# Patient Record
Sex: Male | Born: 1965
Health system: Southern US, Community
[De-identification: ages and names within clinical notes are randomized; demographics above are authoritative.]

## PROBLEM LIST (undated history)

## (undated) DIAGNOSIS — Z8619 Personal history of other infectious and parasitic diseases: Secondary | ICD-10-CM

## (undated) DIAGNOSIS — Z789 Other specified health status: Secondary | ICD-10-CM

## (undated) HISTORY — DX: Personal history of other infectious and parasitic diseases: Z86.19

## (undated) HISTORY — PX: LACERATION REPAIR: SHX5168

## (undated) HISTORY — PX: JOINT REPLACEMENT: SHX530

## (undated) HISTORY — PX: COLONOSCOPY W/ POLYPECTOMY: SHX1380

---

## 1999-09-28 ENCOUNTER — Emergency Department (HOSPITAL_COMMUNITY): Admission: EM | Admit: 1999-09-28 | Discharge: 1999-09-28 | Payer: Self-pay | Admitting: Emergency Medicine

## 2007-04-25 ENCOUNTER — Ambulatory Visit (HOSPITAL_BASED_OUTPATIENT_CLINIC_OR_DEPARTMENT_OTHER): Admission: RE | Admit: 2007-04-25 | Discharge: 2007-04-25 | Payer: Self-pay | Admitting: Orthopedic Surgery

## 2007-06-01 ENCOUNTER — Ambulatory Visit (HOSPITAL_BASED_OUTPATIENT_CLINIC_OR_DEPARTMENT_OTHER): Admission: RE | Admit: 2007-06-01 | Discharge: 2007-06-01 | Payer: Self-pay | Admitting: Orthopedic Surgery

## 2007-06-05 ENCOUNTER — Emergency Department (HOSPITAL_COMMUNITY): Admission: EM | Admit: 2007-06-05 | Discharge: 2007-06-05 | Payer: Self-pay | Admitting: Emergency Medicine

## 2007-06-05 IMAGING — CT CT PELVIS W/O CM
2 of 4 series · 12 of 36 positions shown, 19 images · IV contrast (agent unspecified)
Comparison: Plain films of same date.

CLINICAL DATA: 41-year-old with bilateral groin pain with ambulation. 
 PELVIS CT WITHOUT CONTRAST:
TECHNIQUE: Multidetector CT imaging of the pelvis was performed following the standard protocol without IV contrast.

[Series 4: recon 3: routine pelvis · axial · 0.80mm/px · z∈[-282,-75]mm · 11 of 101 slices shown, 17 images]
[im 9/101  soft-tissue]
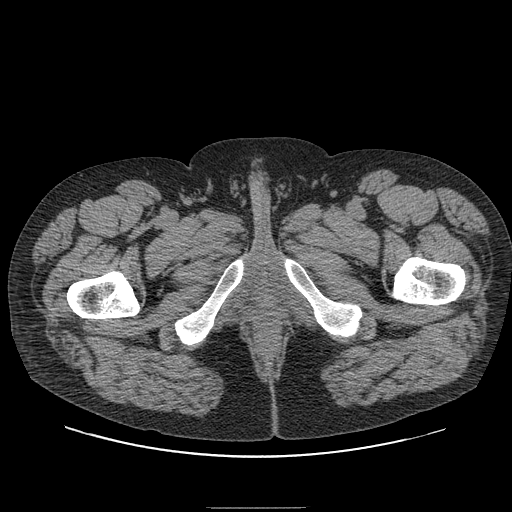
[im 9/101  bone]
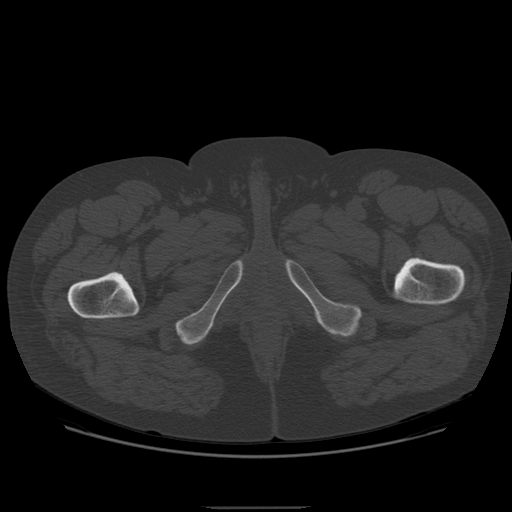
[im 17/101  soft-tissue]
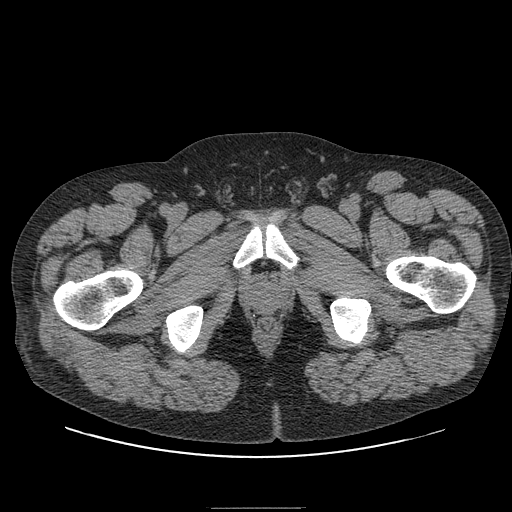
[im 26/101  soft-tissue]
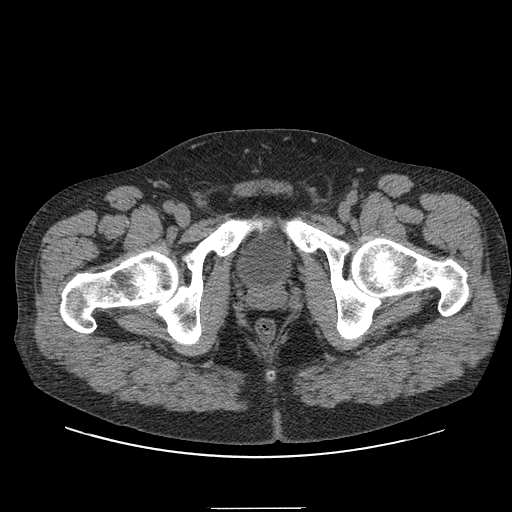
[im 34/101  soft-tissue]
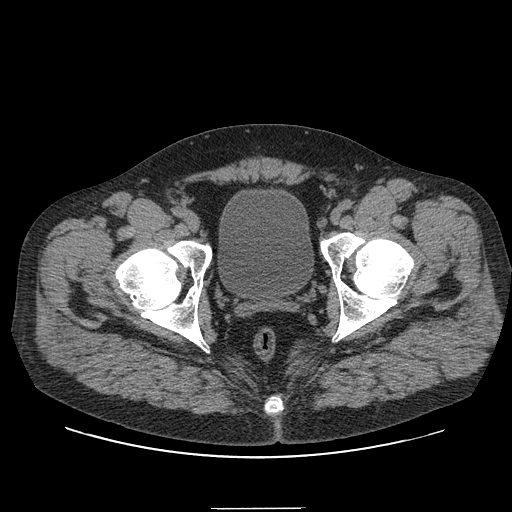
[im 42/101  soft-tissue]
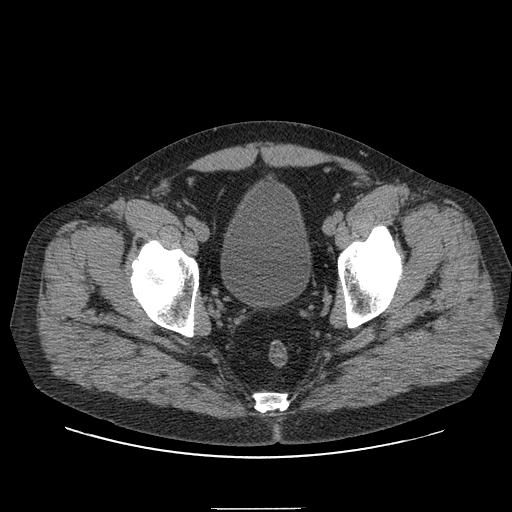
[im 51/101  soft-tissue]
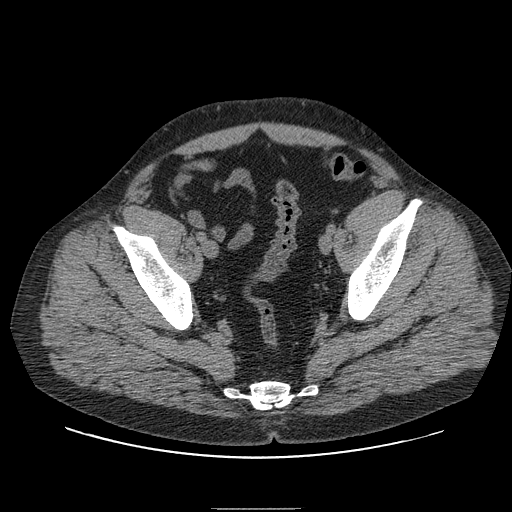
[im 59/101  soft-tissue]
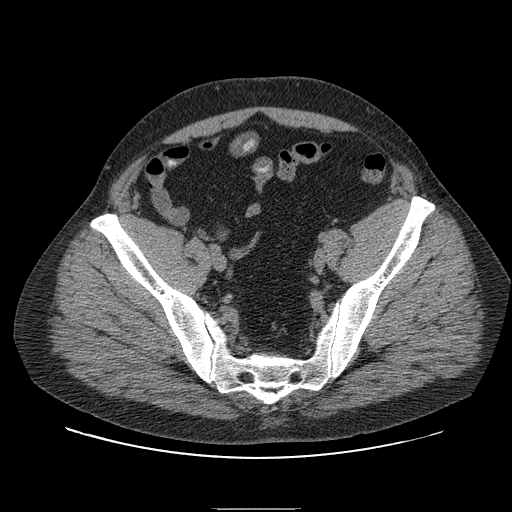
[im 67/101  soft-tissue]
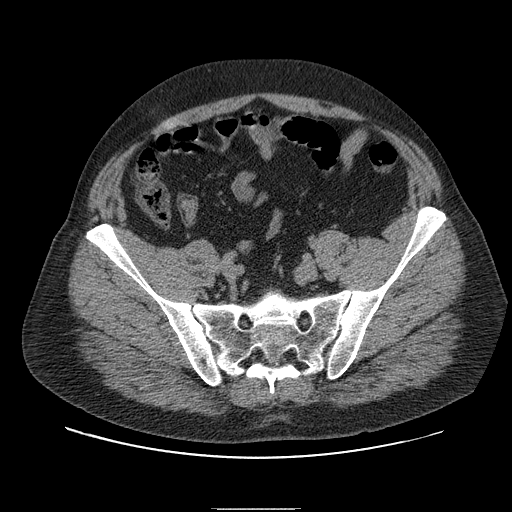
[im 67/101  lung]
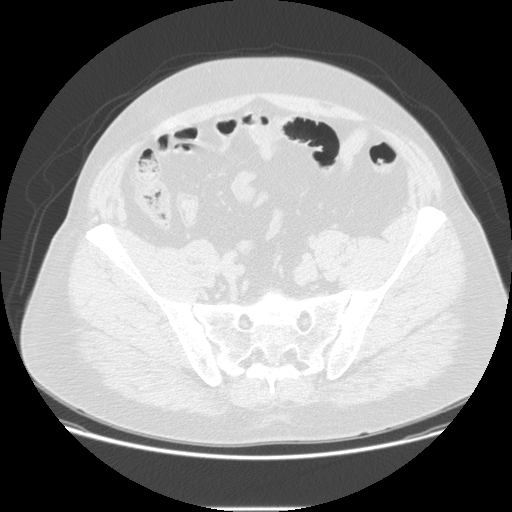
[im 76/101  soft-tissue]
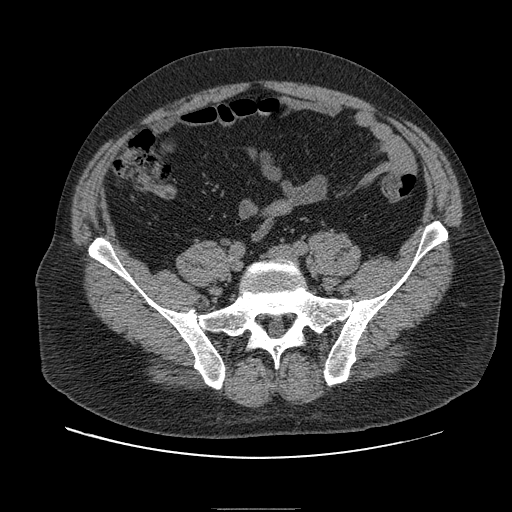
[im 76/101  lung]
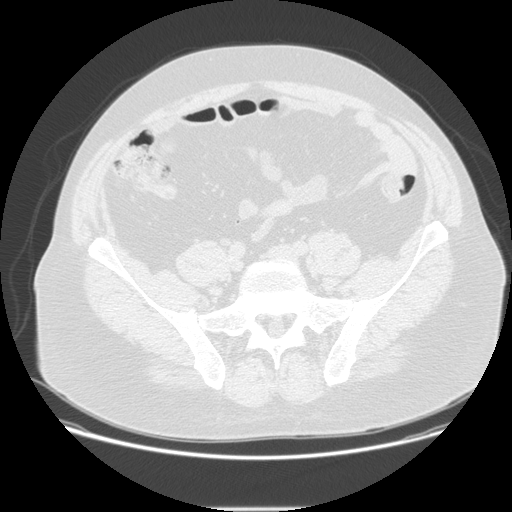
[im 76/101  bone]
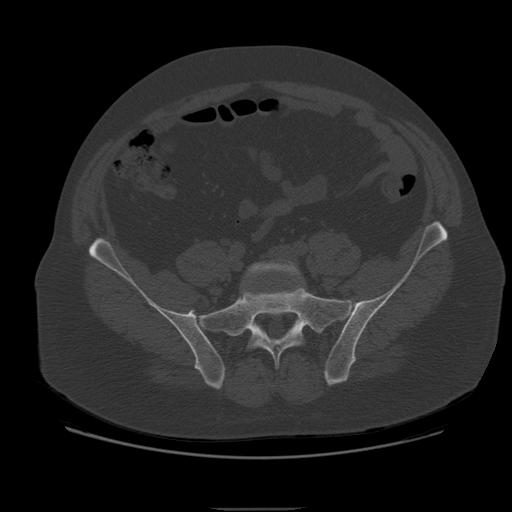
[im 84/101  soft-tissue]
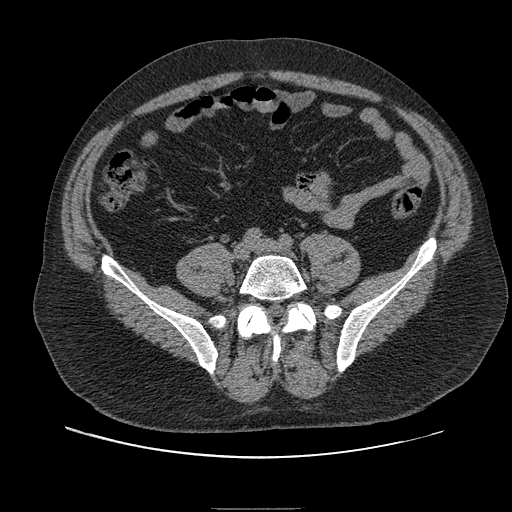
[im 84/101  lung]
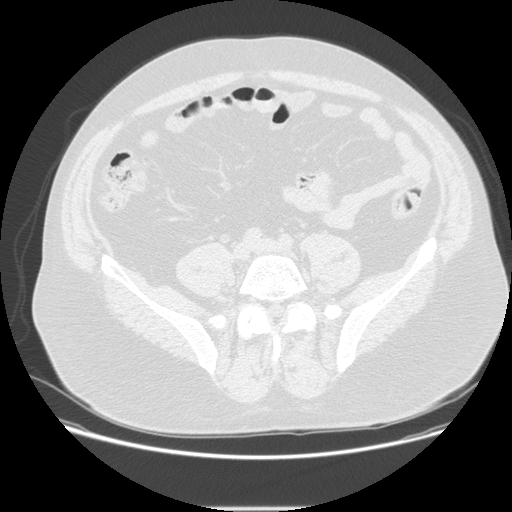
[im 92/101  soft-tissue]
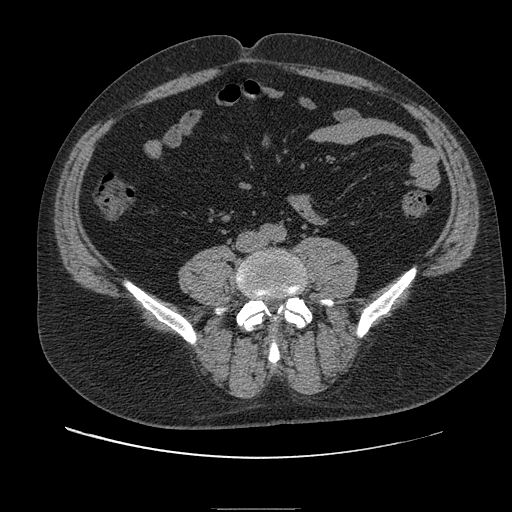
[im 92/101  lung]
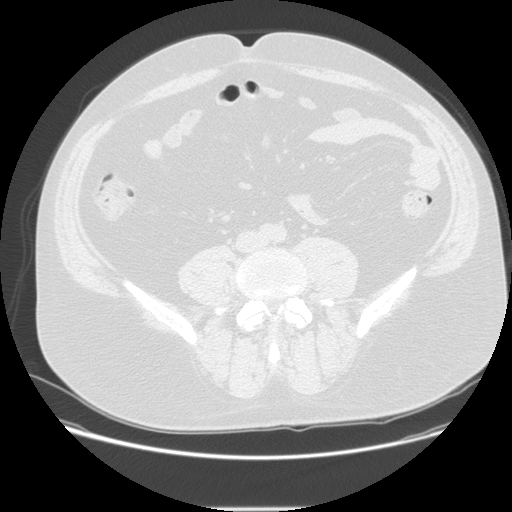

[Series 300: reformatted · sagittal · 0.78mm/px · 1 of 185 slices shown, 2 images]
[im 62/185  soft-tissue]
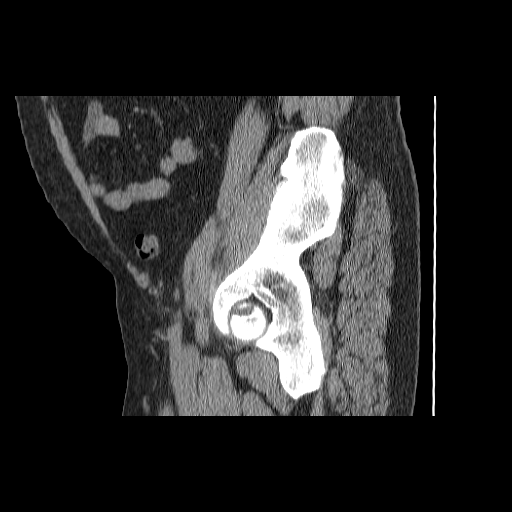
[im 62/185  bone]
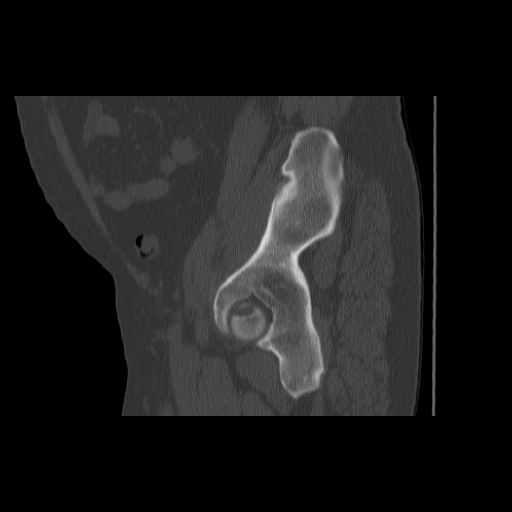

[12 of 36 positions shown; findings below may reference images not displayed]

FINDINGS: No acute or occult bony findings.  Specifically, I do not see any evidence for a stress fracture or avascular necrosis.  The SI joint and pubic symphysis are intact.  No sacral stress insufficiency fractures are demonstrated.  There are hip joint degenerative changes bilaterally, right slightly greater than left.  There is a small os acetabuli, and there are also small cystic changes near the head/neck junction in the region of the right femur, which can be seen with femoral acetabular impingement.   No significant intrapelvic abnormalities are seen.  No inguinal masses, adenopathy, or inguinal hernia.  The appendix is visualized and is normal.  The aorta and iliac vessels are normal in caliber.  The bladder appears normal.  The prostate gland and seminal vesicles are normal.
IMPRESSION: 1.  No acute bony findings.  No plain film evidence for stress fracture or avascular necrosis. 
 2.  Mild hip joint degenerative changes for the patient's age.  The right is slightly worse than the left.  There are cystic changes, which could reflect changes of femoral acetabular impingement. 
 3.  No significant intrapelvic abnormalities are seen.  No inguinal masses, adenopathy, or hernias.

## 2007-06-05 IMAGING — CR DG HIP W/ PELVIS BILAT
5 series · 5 of 5 positions shown · non-contrast
Comparison: None.

CLINICAL DATA: 41-year-old with bilateral hip pain. 
 BILATERAL HIPS WITH AP PELVIS ? 5 VIEW:

[t pelvis a.p.]
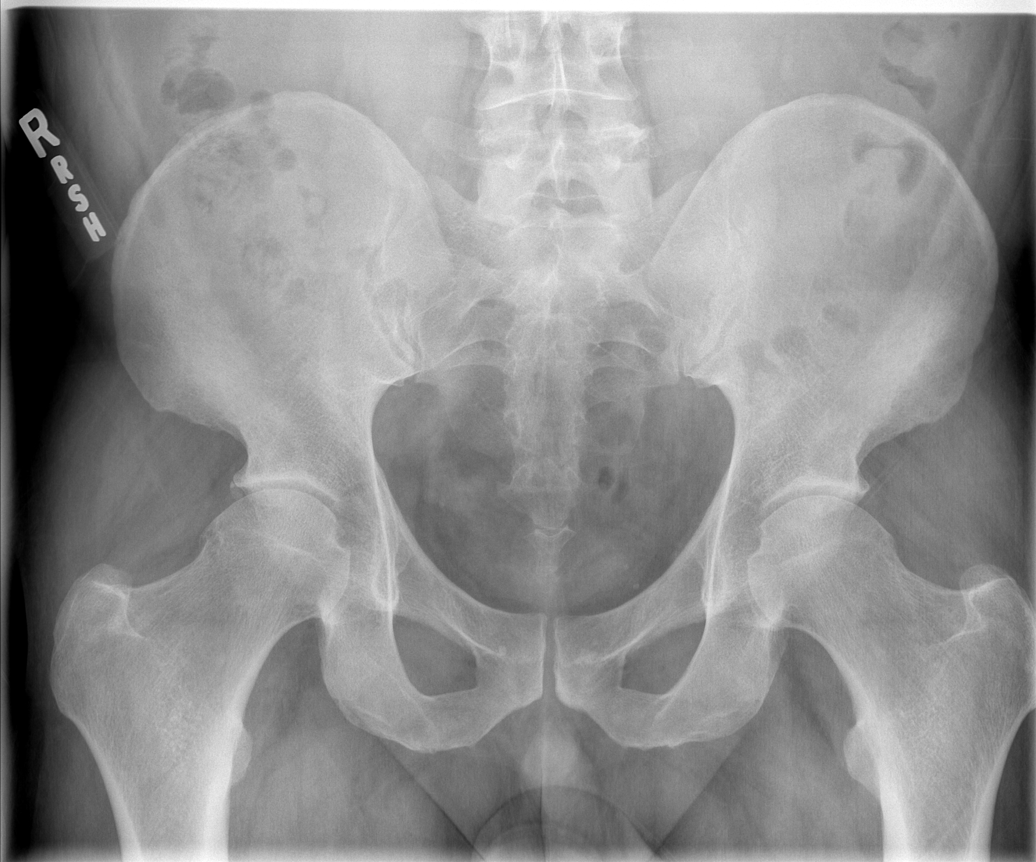

[t hip ap left]
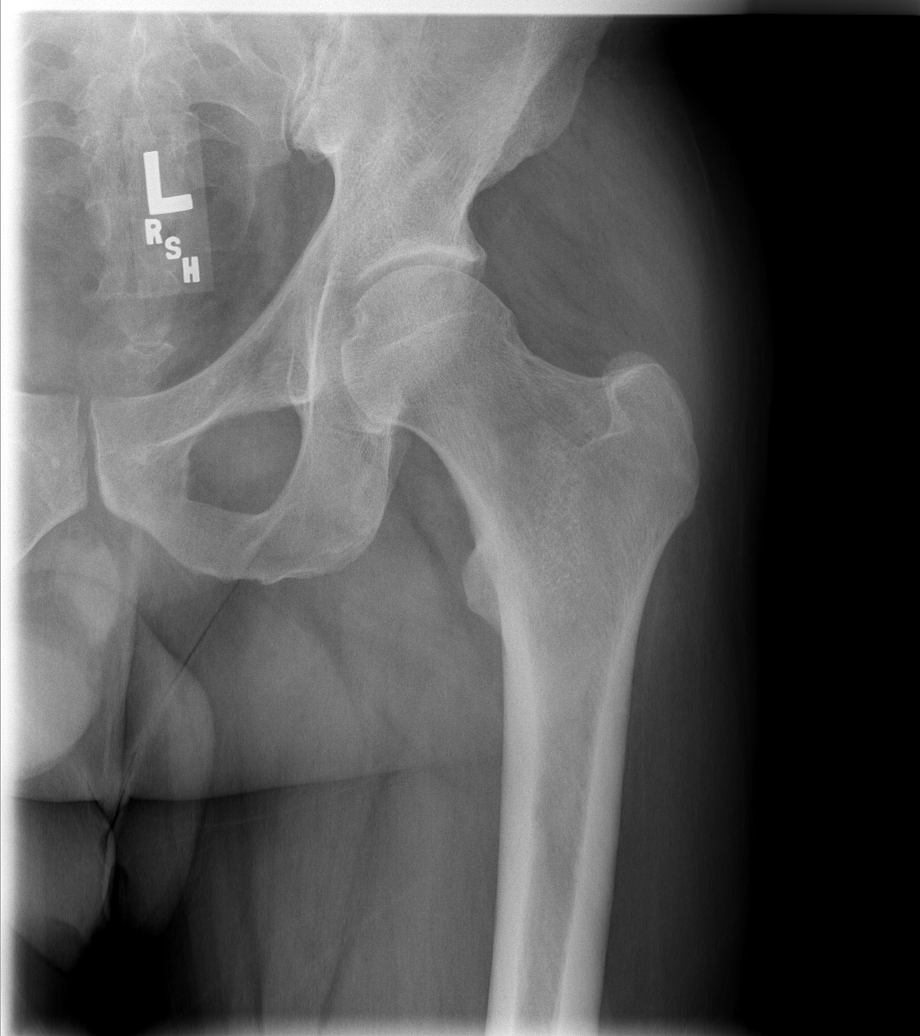

[t hip frog leg left]
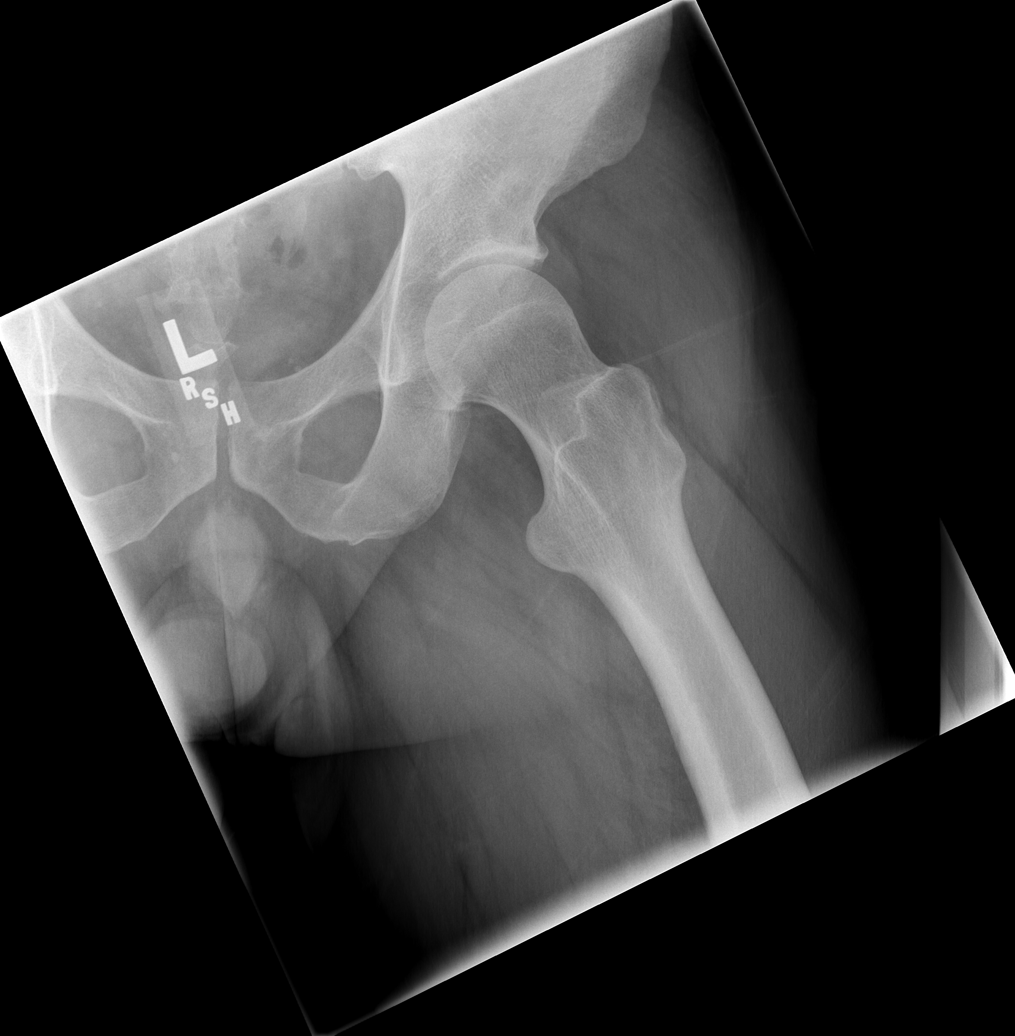

[t hip ap right]
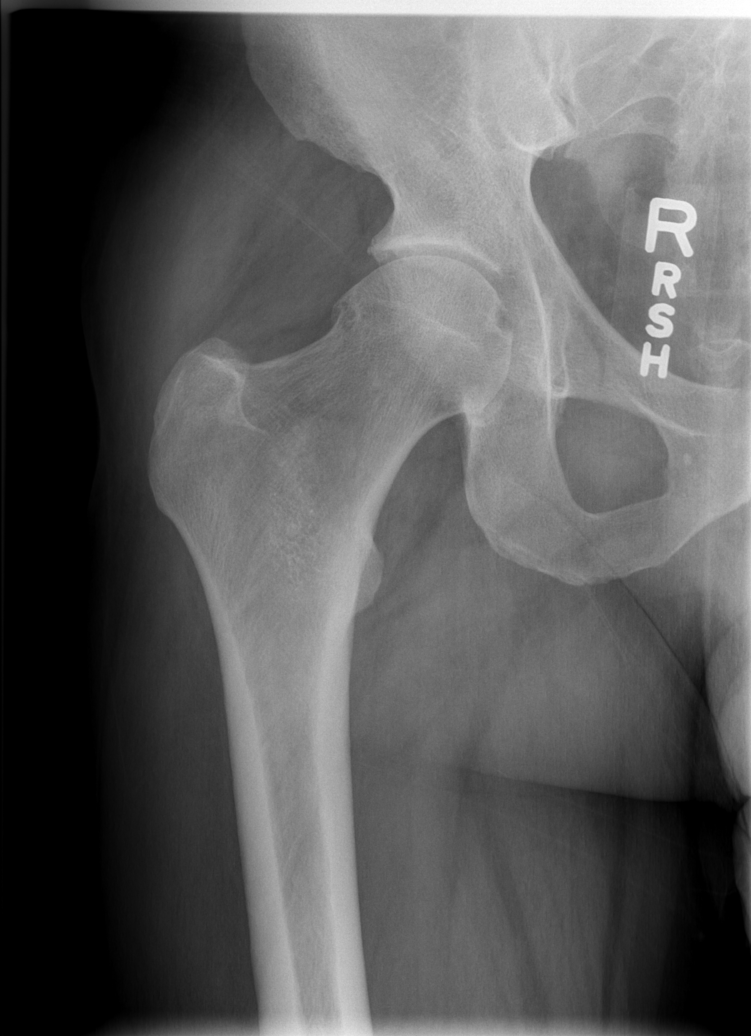

[t hip frog leg right]
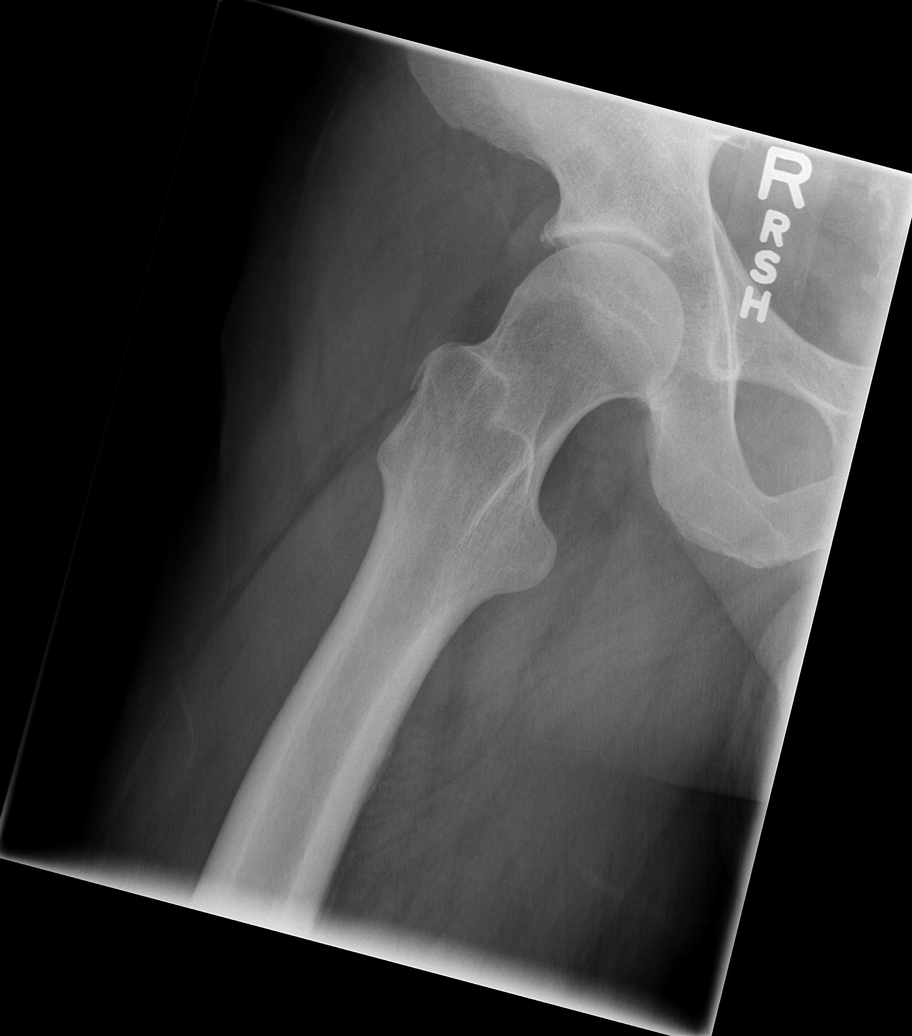

[5 of 5 positions shown; findings below may reference images not displayed]

FINDINGS: There are mild symmetric hip joint degenerative changes bilaterally.  No acute bony findings and no plain film evidence for avascular necrosis.  The pubic symphysis and SI joints are intact.  Pubic rami appear normal.
IMPRESSION: 1.  Hip joint degenerative changes bilaterally which appear fairly symmetric but are also advanced for the patient?s age.  
 2.  No evidence for fracture or avascular necrosis.

## 2007-07-27 HISTORY — PX: CARPAL TUNNEL RELEASE: SHX101

## 2009-07-21 ENCOUNTER — Emergency Department (HOSPITAL_COMMUNITY): Admission: EM | Admit: 2009-07-21 | Discharge: 2009-07-21 | Payer: Self-pay | Admitting: Emergency Medicine

## 2009-07-21 IMAGING — CR DG RIBS W/ CHEST 3+V BILAT
4 series · 4 of 4 positions shown · non-contrast
Comparison: None

CLINICAL DATA: Fall, landing on back.  Severe pain with breathing.

BILATERAL RIBS AND CHEST - 4+ VIEW

[view not recorded (1 of 4)]
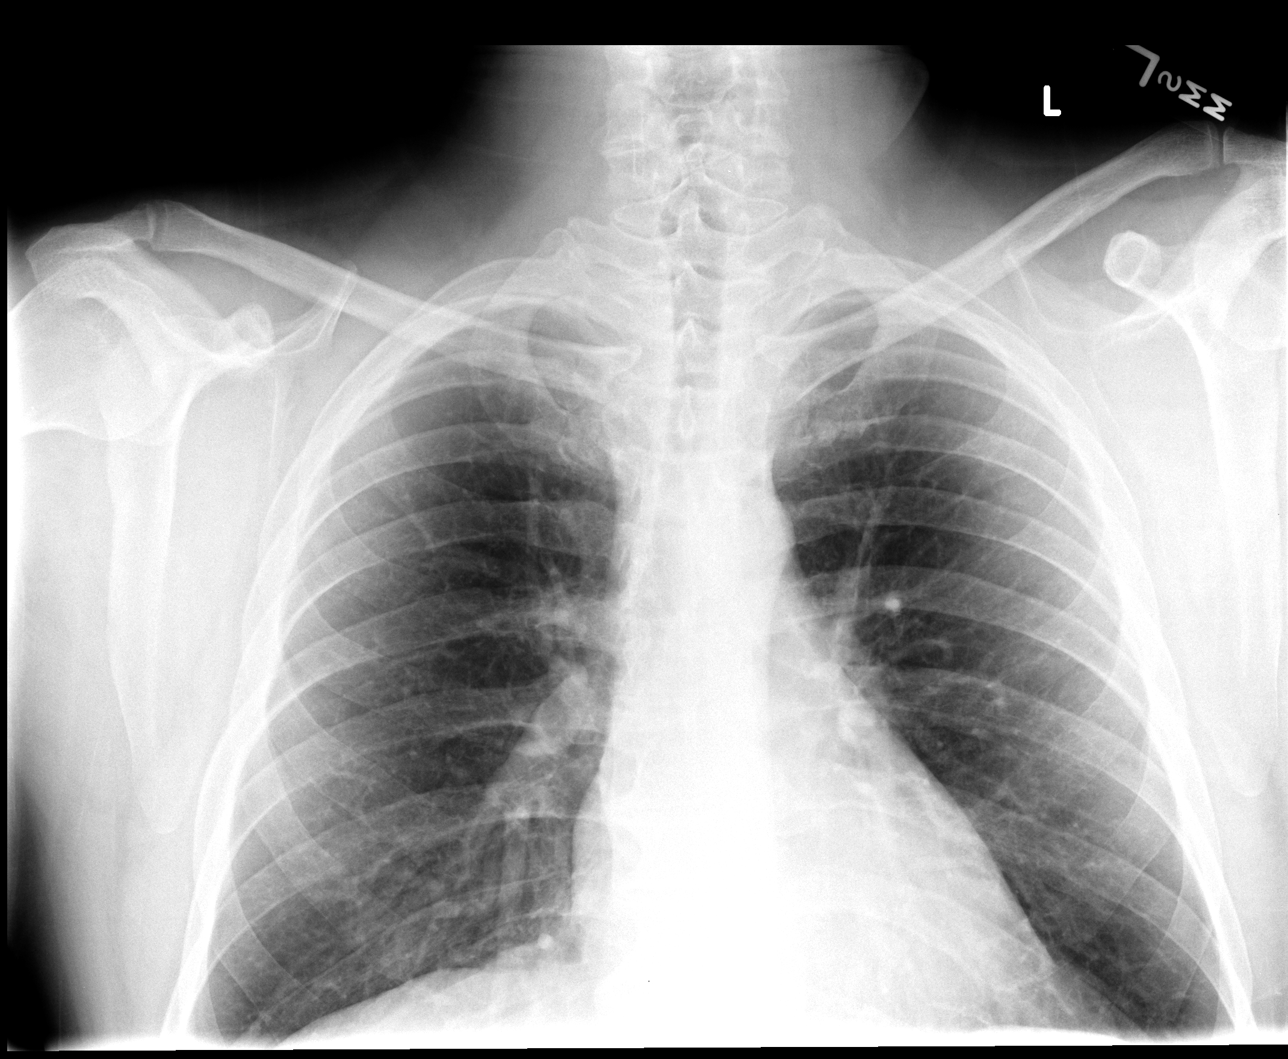

[view not recorded (2 of 4)]
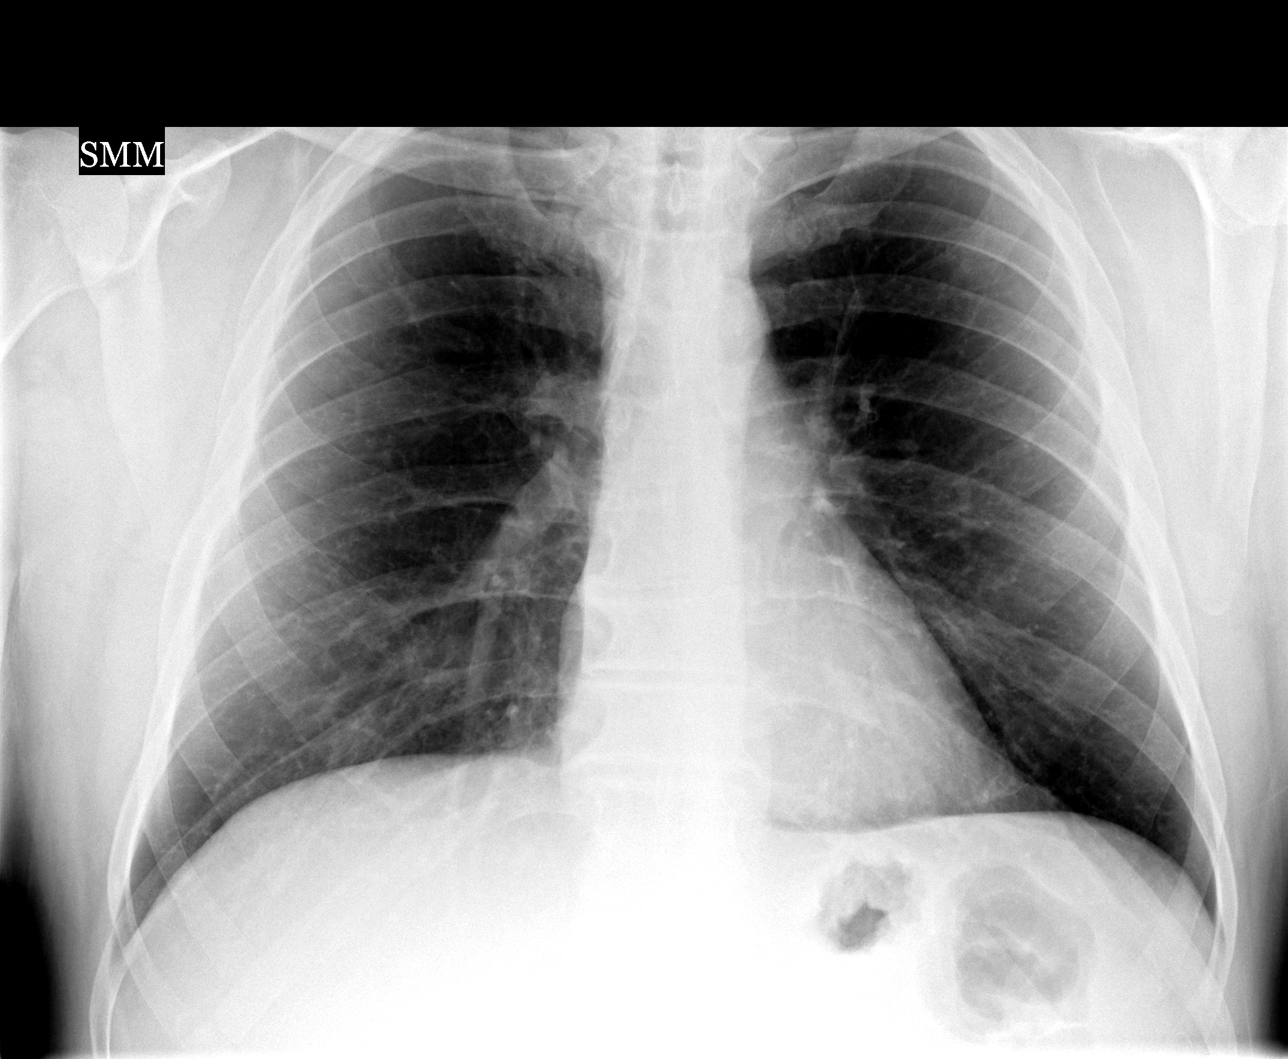

[view not recorded (3 of 4)]
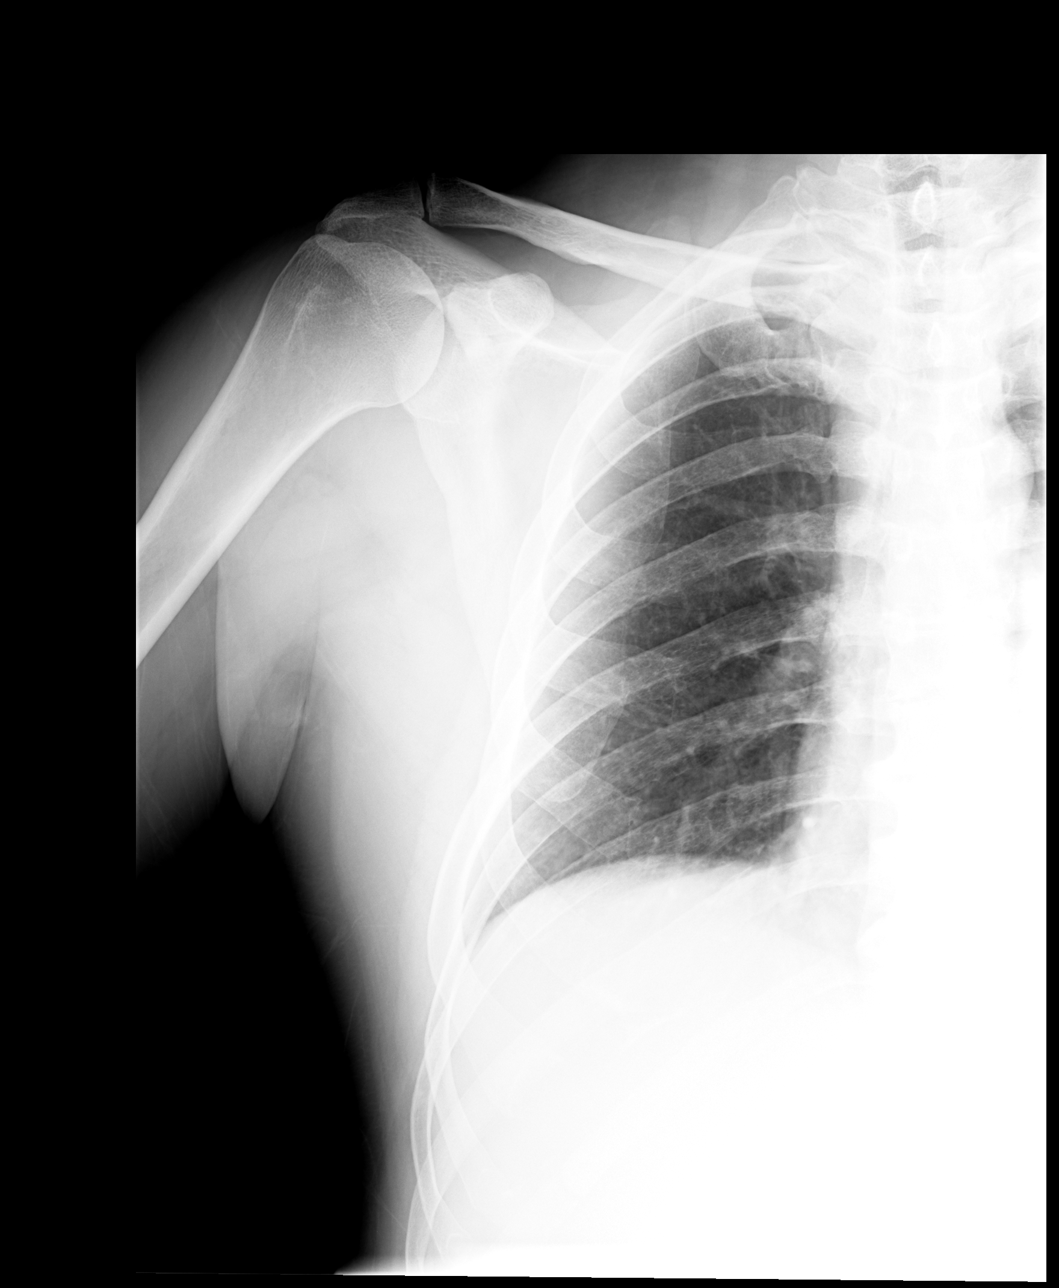

[view not recorded (4 of 4)]
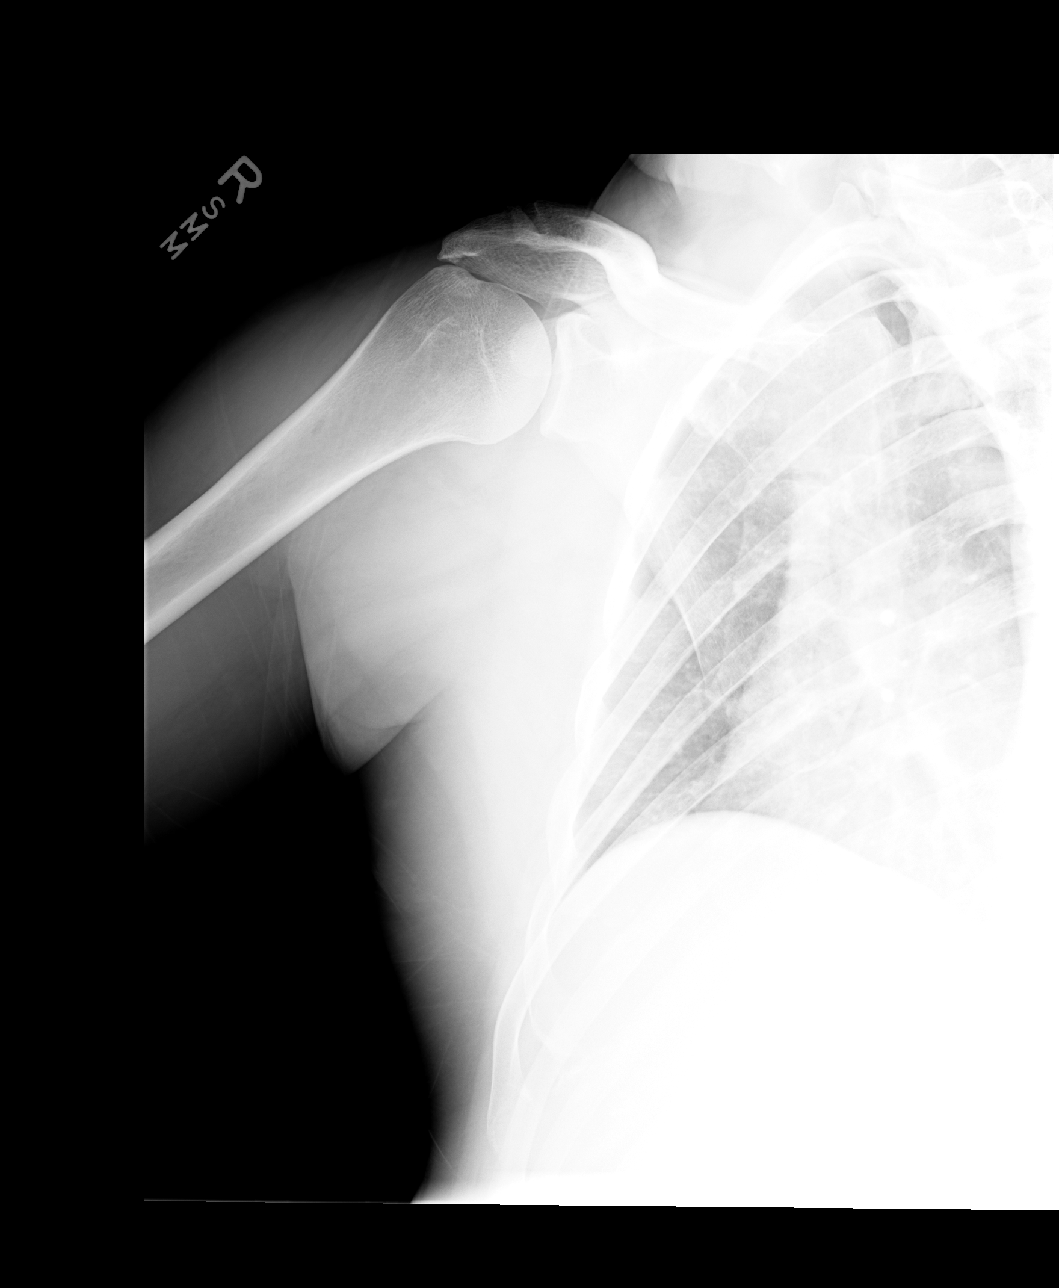

[4 of 4 positions shown; findings below may reference images not displayed]

FINDINGS: Heart and mediastinal contours are within normal limits.
No focal opacities or effusions.  No acute bony abnormality. No
evidence of rib fracture or pneumothorax.
IMPRESSION: No acute cardiopulmonary disease or acute bony abnormality.

## 2010-10-26 LAB — POCT URINALYSIS DIP (DEVICE)
Bilirubin Urine: NEGATIVE
Nitrite: NEGATIVE
Protein, ur: 30 mg/dL — AB
Specific Gravity, Urine: 1.025 (ref 1.005–1.030)
Urobilinogen, UA: 0.2 mg/dL (ref 0.0–1.0)

## 2010-12-08 NOTE — Op Note (Signed)
NAME:  [name], [name] NO.:  [id number]   MEDICAL RECORD NO.:  [id number]          PATIENT TYPE:  AMB   LOCATION:  DSC                          FACILITY:  [facility]   PHYSICIAN:  Katy Fitch. Sypher, M.D. DATE OF BIRTH:  08/26/1965   DATE OF PROCEDURE:  04/25/2007  DATE OF DISCHARGE:                               OPERATIVE REPORT   PREOPERATIVE DIAGNOSIS:  Chronic entrapment neuropathy, right median  nerve at carpal tunnel.   POSTOPERATIVE DIAGNOSIS:  Chronic entrapment neuropathy, right median  nerve at carpal tunnel.   PROCEDURE:  Release of right transverse carpal ligament.   SURGEON:  Katy Fitch. Sypher, M.D.   ASSISTANT:  Annye Rusk, P.A.-C.   ANESTHESIA:  General by LMA.   SUPERVISING ANESTHESIOLOGIST:  Zenon Mayo, M.D.   INDICATIONS:  [name] is a 45 year old gentleman employed by  [employer] who was referred for evaluation and management  of right hand numbness.  Clinical examination revealed signs of probable  carpal tunnel syndrome.  Electrodiagnostic studies obtained prior to his  consult documented significant right carpal tunnel syndrome.   He has failed nonoperative measures including splinting, activity  modification, and steroid injection into his ulnar bursa.  He also  reports that he tried chiropractic treatment for his entrapped  neuropathy without success.  He now presents for release of his right  transverse carpal ligament.   Preoperatively he was advised of the potential risks and benefits of  surgery.   Questions were invited and answered in the holding area.   PROCEDURE:  [name] was brought to the operating room and placed in  the supine position on the operating table.   Following the induction of general anesthesia by LMA technique, the  right arm was prepped with Betadine soap and solution and sterilely  draped.  A pneumatic tourniquet was applied to the proximal right  brachium.   Following  exsanguination of the right arm with Esmarch bandage, the  arterial tourniquet was inflated to 230 mmHg.  The procedure commenced  with a 2-cm incision in the palm in the line of the ring finger.  The  subcutaneous tissues were carefully divided, taking care to identify the  palmar fascia.  This was split in line with its fibers.  The mid palmar  space was explored, with identification of the superficial palmar arch  and the common sensory branch of the median nerve.  The distal margin of  the transverse carpal ligament was isolated.  A Penfield 4 elevator was  used to sound the canal and to clear synovium off of the deep surface of  the transverse carpal ligament.   The ligament was then released with scissors along its ulnar border  extending into the distal forearm.  The volar forearm fascia was  released subcutaneously.   This widely opened the carpal canal.  The ulnar bursa was noted to be  quite fibrotic.  No other masses or predicaments were appreciated.   The wound was then inspect for bleeding points which were  electrocauterized with bipolar current followed by repair of the  skin  with intradermal 3-0 Prolene suture.   A compressive dressing was applied with a volar plaster splint  maintaining the wrist in 5 degrees of dorsiflexion.   For aftercare, [name] is provided a prescription for Percocet 5 mg 1  p.o. q.4-6h. p.r.n. pain, 20 tablets without refill.   He will return to see me for followup in the office in 1 week or sooner  p.r.n. any problems.  He is cautioned to keep his dressing dry.  He may  begin immediate range of motion excises.      Katy Fitch Sypher, M.D.  Electronically Signed     RVS/MEDQ  D:  04/25/2007  T:  04/25/2007  Job:  [id number]

## 2010-12-08 NOTE — Op Note (Signed)
NAME:  [name], [name] NO.:  [id number]   MEDICAL RECORD NO.:  [id number]          PATIENT TYPE:  AMB   LOCATION:  DSC                          FACILITY:  [facility]   PHYSICIAN:  Katy Fitch. Sypher, M.D. 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   POSTOPERATIVE DIAGNOSIS:  Entrapment neuropathy, left median nerve at  wrist.   OPERATION:  Release of left transverse carpal ligament.   OPERATING SURGEON:  Josephine Igo, MD   ASSISTANT:  Annye Rusk, PA-C.   ANESTHESIA:  General by LMA.   SUPERVISING ANESTHESIOLOGIST:  Bedelia Person, MD   INDICATIONS:  [name] was brought to the operating room and placed  in a supine position on the operating table.   Following an anesthesia consult with Dr. Gypsy Balsam, general anesthesia by  LMA technique was recommended and accepted by [name]  [name]  was brought to room #5 of the [facility] and placed in supine  position upon the operating table and under Dr. Burnett Corrente direct  supervision, general anesthesia by LMA technique induced.   The left arm was then prepped with Betadine soap and solution and  sterilely draped.  A pneumatic tourniquet was applied to the proximal  left brachium.  Following exsanguination of the left arm with an Esmarch  bandage, the arterial tourniquet was inflated to 220 mmHg.   The procedure commenced with a short incision in the line of the ring  finger and the palm.  Subcutaneous tissues were carefully divided,  revealing the palmar fascia.  This was split longitudinally to reveal  the common extensor branch of the median nerve.  These were followed  back to the transverse carpal ligament, which was gently isolated from  the median nerve.  The median nerve was noted to have a persistent  median artery and veins along its  volar surface.  After the nerve was  carefully cleared from the deep surface of the transverse carpal  ligament and the volar forearm fascia, the ligament and fascia were  released subcutaneously into the distal forearm with scissors; this  widely opened carpal canal, exposing the ulnar bursa.  No masses or  predicaments were noted.  There were no significant bleeding issues.   The wound was then repaired with intradermal 3-0 Prolene suture.   A compressive dressing was applied with a volar plaster splint,  maintaining the wrist in 5 degrees of dorsiflexion.   For aftercare, [name] was provided a prescription for Percocet 5 mg  one p.o. q.4-6 h. p.r.n. pain, 20 tablets without refill.   We will see him back for followup in the office in 1 week for dressing  change and advancement to an exercise program.      Katy Fitch. Sypher, M.D.  Electronically Signed     RVS/MEDQ  D:  06/01/2007  T:  06/02/2007  Job:  [id number]

## 2011-05-04 LAB — URINALYSIS, ROUTINE W REFLEX MICROSCOPIC
Bilirubin Urine: NEGATIVE
Glucose, UA: NEGATIVE
Ketones, ur: NEGATIVE
Nitrite: NEGATIVE
Protein, ur: NEGATIVE
Specific Gravity, Urine: 1.008
pH: 6

## 2011-05-04 LAB — BASIC METABOLIC PANEL
Chloride: 100
Creatinine, Ser: 1.09
Sodium: 133 — ABNORMAL LOW

## 2011-05-04 LAB — POCT HEMOGLOBIN-HEMACUE: Hemoglobin: 15

## 2011-05-04 LAB — CK: Total CK: 62

## 2012-09-19 ENCOUNTER — Ambulatory Visit (INDEPENDENT_AMBULATORY_CARE_PROVIDER_SITE_OTHER): Payer: BC Managed Care – HMO | Admitting: Internal Medicine

## 2012-09-19 ENCOUNTER — Other Ambulatory Visit (INDEPENDENT_AMBULATORY_CARE_PROVIDER_SITE_OTHER): Payer: BC Managed Care – HMO

## 2012-09-19 ENCOUNTER — Encounter: Payer: Self-pay | Admitting: Internal Medicine

## 2012-09-19 VITALS — BP 132/82 | HR 95 | Temp 98.1°F | Ht 72.0 in | Wt 235.4 lb

## 2012-09-19 DIAGNOSIS — Z0000 Encounter for general adult medical examination without abnormal findings: Secondary | ICD-10-CM

## 2012-09-19 DIAGNOSIS — K625 Hemorrhage of anus and rectum: Secondary | ICD-10-CM

## 2012-09-19 DIAGNOSIS — Z130 Encounter for screening for diseases of the blood and blood-forming organs and certain disorders involving the immune mechanism: Secondary | ICD-10-CM

## 2012-09-19 DIAGNOSIS — Z131 Encounter for screening for diabetes mellitus: Secondary | ICD-10-CM

## 2012-09-19 DIAGNOSIS — Z13220 Encounter for screening for lipoid disorders: Secondary | ICD-10-CM

## 2012-09-19 LAB — LIPID PANEL
Cholesterol: 215 mg/dL — ABNORMAL HIGH (ref 0–200)
HDL: 65.1 mg/dL (ref >39.00)
Triglycerides: 106 mg/dL (ref 0.0–149.0)
VLDL: 21.2 mg/dL (ref 0.0–40.0)

## 2012-09-19 LAB — CBC: MCV: 97.9 fl (ref 78.0–100.0)

## 2012-09-19 LAB — BASIC METABOLIC PANEL
BUN: 11 mg/dL (ref 6–23)
CO2: 28 mEq/L (ref 19–32)

## 2012-09-19 MED ORDER — HYDROCORTISONE ACETATE 25 MG RE SUPP: 25.0000 mg | Freq: Two times a day (BID) | RECTAL | Status: DC

## 2012-09-19 NOTE — Progress Notes (Signed)
HPI  [name] presents to clinic today to establish care. He has not seen a PCP ever. He uses the ED and urgent care when he needs something. He does have some concerns today about intermittent stool in his blood. He notices blood on the stool in the toilet as well when he wipes. This did occur about 1 year ago. He had it evaluated and the doctor told it him it was hemorrhoids but he never received treatment for this. He is very concerned.  Flu: never Tetanus: never Eye doctor: never Dentist: yearly   Past Medical History  Diagnosis Date  . Blood in stool     Current Outpatient Prescriptions  Medication Sig Dispense Refill  . hydrocortisone (ANUSOL-HC) 25 MG suppository Place 1 suppository (25 mg total) rectally 2 (two) times daily.  12 suppository  0   No current facility-administered medications for this visit.    No Known Allergies  Family History  Problem Relation Age of Onset  . Cancer Father   . Diabetes Neg Hx   . Stroke Neg Hx   . Hypertension Neg Hx   . Hyperlipidemia Neg Hx     History   Social History  . Marital Status: Married    Spouse Name: N/A    Number of Children: N/A  . Years of Education: 12   Occupational History  .     Social History Main Topics  . Smoking status: Former Games developer  . Smokeless tobacco: Not on file  . Alcohol Use: Yes  . Drug Use: No  . Sexually Active: Yes   Other Topics Concern  . Not on file   Social History Narrative   Regular exercise-no   Caffeine Use-yes    ROS:  Constitutional: Denies fever, malaise, fatigue, headache or abrupt weight changes.  HEENT: Denies eye pain, eye redness, ear pain, ringing in the ears, wax buildup, runny nose, nasal congestion, bloody nose, or sore throat. Respiratory: Denies difficulty breathing, shortness of breath, cough or sputum production.   Cardiovascular: Denies chest pain, chest tightness, palpitations or swelling in the hands or feet.  Gastrointestinal: [name] reports blood in stool.  Denies abdominal pain, bloating, constipation, diarrhea or blood in the stool.  GU: Denies frequency, urgency, pain with urination, blood in urine, odor or discharge. Musculoskeletal: Denies decrease in range of motion, difficulty with gait, muscle pain or joint pain and swelling.  Skin: Denies redness, rashes, lesions or ulcercations.  Neurological: Denies dizziness, difficulty with memory, difficulty with speech or problems with balance and coordination.   No other specific complaints in a complete review of systems (except as listed in HPI above).  PE:  BP 132/82  Pulse 95  Temp(Src) 98.1 F (36.7 C) (Oral)  Ht 6' (1.829 m)  Wt 235 lb 6.4 oz (106.777 kg)  BMI 31.92 kg/m2  SpO2 97% Wt Readings from Last 3 Encounters:  09/19/12 235 lb 6.4 oz (106.777 kg)    General: Appears his stated age, well developed, well nourished in NAD. HEENT: Head: normal shape and size; Eyes: sclera white, no icterus, conjunctiva pink, PERRLA and EOMs intact; Ears: Tm's gray and intact, normal light reflex; Nose: mucosa pink and moist, septum midline; Throat/Mouth: Teeth present, mucosa pink and moist, no lesions or ulcerations noted.  Neck: Normal range of motion. Neck supple, trachea midline. No massses, lumps or thyromegaly present.  Cardiovascular: Normal rate and rhythm. S1,S2 noted.  No murmur, rubs or gallops noted. No JVD or BLE edema. No carotid bruits noted. Pulmonary/Chest: Normal  effort and positive vesicular breath sounds. No respiratory distress. No wheezes, rales or ronchi noted.  Abdomen: Soft and nontender. Normal bowel sounds, no bruits noted. No distention or masses noted. Liver, spleen and kidneys non palpable. No external hemorrhoids, no internal hemorrhoids per rectal exam. No fissures noted. Musculoskeletal: Normal range of motion. No signs of joint swelling. No difficulty with gait.  Neurological: Alert and oriented. Cranial nerves II-XII intact. Coordination normal. +DTRs  bilaterally. Psychiatric: Mood and affect normal. Behavior is normal. Judgment and thought content normal.     Assessment and Plan:  Preventative Health Maintenance:  [name] declines flu and tetanus shot Will obtain basic screening labs today  Rectal Bleeding, likely due to internal hemorrhoids, new onset with additional workup required:  Rectal exam negative eRx for anusol suppositories If continues, will need referral to GI for possible scope  RTC in 1 year or sooner if needed

## 2012-09-19 NOTE — Patient Instructions (Signed)
Health Maintenance, Males A healthy lifestyle and preventative care can promote health and wellness.  Maintain regular health, dental, and eye exams.  Eat a healthy diet. Foods like vegetables, fruits, whole grains, low-fat dairy products, and lean protein foods contain the nutrients you need without too many calories. Decrease your intake of foods high in solid fats, added sugars, and salt. Get information about a proper diet from your caregiver, if necessary.  Regular physical exercise is one of the most important things you can do for your health. Most adults should get at least 150 minutes of moderate-intensity exercise (any activity that increases your heart rate and causes you to sweat) each week. In addition, most adults need muscle-strengthening exercises on 2 or more days a week.   Maintain a healthy weight. The body mass index (BMI) is a screening tool to identify possible weight problems. It provides an estimate of body fat based on height and weight. Your caregiver can help determine your BMI, and can help you achieve or maintain a healthy weight. For adults 20 years and older:  A BMI below 18.5 is considered underweight.  A BMI of 18.5 to 24.9 is normal.  A BMI of 25 to 29.9 is considered overweight.  A BMI of 30 and above is considered obese.  Maintain normal blood lipids and cholesterol by exercising and minimizing your intake of saturated fat. Eat a balanced diet with plenty of fruits and vegetables. Blood tests for lipids and cholesterol should begin at age 20 and be repeated every 5 years. If your lipid or cholesterol levels are high, you are over 50, or you are a high risk for heart disease, you may need your cholesterol levels checked more frequently.Ongoing high lipid and cholesterol levels should be treated with medicines, if diet and exercise are not effective.  If you smoke, find out from your caregiver how to quit. If you do not use tobacco, do not start.  If you  choose to drink alcohol, do not exceed 2 drinks per day. One drink is considered to be 12 ounces (355 mL) of beer, 5 ounces (148 mL) of wine, or 1.5 ounces (44 mL) of liquor.  Avoid use of street drugs. Do not share needles with anyone. Ask for help if you need support or instructions about stopping the use of drugs.  High blood pressure causes heart disease and increases the risk of stroke. Blood pressure should be checked at least every 1 to 2 years. Ongoing high blood pressure should be treated with medicines if weight loss and exercise are not effective.  If you are 45 to 48 years old, ask your caregiver if you should take aspirin to prevent heart disease.  Diabetes screening involves taking a blood sample to check your fasting blood sugar level. This should be done once every 3 years, after age 45, if you are within normal weight and without risk factors for diabetes. Testing should be considered at a younger age or be carried out more frequently if you are overweight and have at least 1 risk factor for diabetes.  Colorectal cancer can be detected and often prevented. Most routine colorectal cancer screening begins at the age of 50 and continues through age 75. However, your caregiver may recommend screening at an earlier age if you have risk factors for colon cancer. On a yearly basis, your caregiver may provide home test kits to check for hidden blood in the stool. Use of a small camera at the end of a tube,   to directly examine the colon (sigmoidoscopy or colonoscopy), can detect the earliest forms of colorectal cancer. Talk to your caregiver about this at age 50, when routine screening begins. Direct examination of the colon should be repeated every 5 to 10 years through age 75, unless early forms of pre-cancerous polyps or small growths are found.  Hepatitis C blood testing is recommended for all people born from 1945 through 1965 and any individual with known risks for hepatitis C.  Healthy  men should no longer receive prostate-specific antigen (PSA) blood tests as part of routine cancer screening. Consult with your caregiver about prostate cancer screening.  Testicular cancer screening is not recommended for adolescents or adult males who have no symptoms. Screening includes self-exam, caregiver exam, and other screening tests. Consult with your caregiver about any symptoms you have or any concerns you have about testicular cancer.  Practice safe sex. Use condoms and avoid high-risk sexual practices to reduce the spread of sexually transmitted infections (STIs).  Use sunscreen with a sun protection factor (SPF) of 30 or greater. Apply sunscreen liberally and repeatedly throughout the day. You should seek shade when your shadow is shorter than you. Protect yourself by wearing long sleeves, pants, a wide-brimmed hat, and sunglasses year round, whenever you are outdoors.  Notify your caregiver of new moles or changes in moles, especially if there is a change in shape or color. Also notify your caregiver if a mole is larger than the size of a pencil eraser.  A one-time screening for abdominal aortic aneurysm (AAA) and surgical repair of large AAAs by sound wave imaging (ultrasonography) is recommended for ages 65 to 75 years who are current or former smokers.  Stay current with your immunizations. Document Released: 01/08/2008 Document Revised: 10/04/2011 Document Reviewed: 12/07/2010 [employer] Patient Information 2013 [employer].  

## 2012-11-16 ENCOUNTER — Ambulatory Visit (INDEPENDENT_AMBULATORY_CARE_PROVIDER_SITE_OTHER): Payer: BC Managed Care – HMO | Admitting: Nurse Practitioner

## 2012-11-16 ENCOUNTER — Encounter: Payer: Self-pay | Admitting: Internal Medicine

## 2012-11-16 ENCOUNTER — Encounter: Payer: Self-pay | Admitting: Nurse Practitioner

## 2012-11-16 ENCOUNTER — Ambulatory Visit (INDEPENDENT_AMBULATORY_CARE_PROVIDER_SITE_OTHER): Payer: BC Managed Care – HMO | Admitting: Internal Medicine

## 2012-11-16 ENCOUNTER — Encounter: Payer: Self-pay | Admitting: Gastroenterology

## 2012-11-16 VITALS — BP 132/68 | HR 68 | Temp 97.0°F | Ht 72.0 in | Wt 238.0 lb

## 2012-11-16 VITALS — BP 116/76 | HR 76 | Ht 71.65 in | Wt 237.2 lb

## 2012-11-16 DIAGNOSIS — K625 Hemorrhage of anus and rectum: Secondary | ICD-10-CM

## 2012-11-16 MED ORDER — MOVIPREP 100 G PO SOLR: 1.0000 | Freq: Once | ORAL | Status: DC

## 2012-11-16 NOTE — Progress Notes (Signed)
  HPI :  Patient is a 47 year old male with no significant past medical history referred here by PCP for evaluation of rectal bleeding. Patient initially presented to his PCP in late February with a one-year history of rectal bleeding with bowel movements. Digital rectal exam that day was normal. Patient was treated empirically for hemorrhoids. He had a followup appointment with PCP this morning at which time recurrent rectal bleeding was reported. He denies rectal pain, nausea, vomiting or abdominal pain. Denies constipation, diarrhea or any bowel changes. Weight is stable. No family history colon cancer. Patient has never had a colonoscopy.  History reviewed. No pertinent past medical history.  Family History  Problem Relation Age of Onset  . Cancer Father     Hodgkin's disease  . Diabetes Neg Hx   . Stroke Neg Hx   . Hypertension Neg Hx   . Hyperlipidemia Neg Hx    History  Substance Use Topics  . Smoking status: Former Smoker    Types: Cigarettes    Quit date: 07/27/1999  . Smokeless tobacco: Never Used  . Alcohol Use: Yes     Comment: 1-12 beers per day   No current outpatient prescriptions on file.   No current facility-administered medications for this visit.   No Known Allergies  Review of Systems: All systems reviewed and negative except where noted in HPI.   Physical Exam: BP 116/76  Pulse 76  Ht 5' 11.65" (1.82 m)  Wt 237 lb 4 oz (107.616 kg)  BMI 32.49 kg/m2 Constitutional: Well-developed, white male in no acute distress. HEENT: Normocephalic and atraumatic. Conjunctivae are normal. No scleral icterus. Neck supple.  Cardiovascular: Normal rate, regular rhythm.  Pulmonary/chest: Effort normal and breath sounds normal. No wheezing, rales or rhonchi. Abdominal: Soft, nondistended, nontender. Bowel sounds active throughout. There are no masses palpable. No hepatomegaly. Rectal: Patient had rectal exams morning at PCP, he request no repeat rectal exam Extremities:  no edema Lymphadenopathy: No cervical adenopathy noted. Neurological: Alert and oriented to person place and time. Skin: Skin is warm and dry. No rashes noted. Psychiatric: Normal mood and affect. Behavior is normal.   ASSESSMENT AND PLAN:  47 year old male with a one year plus history of rectal bleeding with bowel movements. Bleeding did not respond to hemorrhoid medication prescribed by PCP. For further evaluation patient needs a colonoscopy to exclude other etiologies of bleeding. the risks, benefits, and alternatives to colonoscopy with possible biopsy and possible polypectomy were discussed with the patient and he consents to proceed.

## 2012-11-16 NOTE — Patient Instructions (Signed)
Rectal Bleeding Rectal bleeding is when blood passes out of the anus. It is usually a sign that something is wrong. It may not be serious, but it should always be evaluated. Rectal bleeding may present as bright red blood or extremely dark stools. The color may range from dark red or maroon to black (like tar). It is important that the cause of rectal bleeding be identified so treatment can be started and the problem corrected. CAUSES   Hemorrhoids. These are enlarged (dilated) blood vessels or veins in the anal or rectal area.  Fistulas. Theseare abnormal, burrowing channels that usually run from inside the rectum to the skin around the anus. They can bleed.  Anal fissures. This is a tear in the tissue of the anus. Bleeding occurs with bowel movements.  Diverticulosis. This is a condition in which pockets or sacs project from the bowel wall. Occasionally, the sacs can bleed.  Diverticulitis. Thisis an infection involving diverticulosis of the colon.  Proctitis and colitis. These are conditions in which the rectum, colon, or both, can become inflamed and pitted (ulcerated).  Polyps and cancer. Polyps are non-cancerous (benign) growths in the colon that may bleed. Certain types of polyps turn into cancer.  Protrusion of the rectum. Part of the rectum can project from the anus and bleed.  Certain medicines.  Intestinal infections.  Blood vessel abnormalities. HOME CARE INSTRUCTIONS  Eat a high-fiber diet to keep your stool soft.  Limit activity.  Drink enough fluids to keep your urine clear or pale yellow.  Warm baths may be useful to soothe rectal pain.  Follow up with your caregiver as directed. SEEK IMMEDIATE MEDICAL CARE IF:  You develop increased bleeding.  You have black or dark red stools.  You vomit blood or material that looks like coffee grounds.  You have abdominal pain or tenderness.  You have a fever.  You feel weak, nauseous, or you faint.  You have  severe rectal pain or you are unable to have a bowel movement. MAKE SURE YOU:  Understand these instructions.  Will watch your condition.  Will get help right away if you are not doing well or get worse. Document Released: 01/01/2002 Document Revised: 10/04/2011 Document Reviewed: 12/27/2010 ExitCare Patient Information 2013 [employer].  

## 2012-11-16 NOTE — Progress Notes (Signed)
Subjective:    Patient ID: [name], male    DOB: 1965/10/26, 47 y.o.   MRN: [id number]  HPI  Pt presents to the clinic today with c/o recurrent rectal bleeding. At his last visit 08/2012, he had the same complaint. Rectal exam was performed. No external hemorrhoids or anal fissure were noted. He was given Anusol suppositories for possible internal hemorrhoids. Since that time he has continued to have red blood in his stool. He does not strain. He does not have hard BM. He has not pain with evacuation. He denies symptoms of anemia including chest pain, shortness of breath, lightheaded or dizziness.  Review of Systems      Past Medical History  Diagnosis Date  . Blood in stool     Current Outpatient Prescriptions  Medication Sig Dispense Refill  . hydrocortisone (ANUSOL-HC) 25 MG suppository Place 1 suppository (25 mg total) rectally 2 (two) times daily.  12 suppository  0   No current facility-administered medications for this visit.    No Known Allergies  Family History  Problem Relation Age of Onset  . Cancer Father   . Diabetes Neg Hx   . Stroke Neg Hx   . Hypertension Neg Hx   . Hyperlipidemia Neg Hx     History   Social History  . Marital Status: Married    Spouse Name: N/A    Number of Children: N/A  . Years of Education: 12   Occupational History  .     Social History Main Topics  . Smoking status: Former Games developer  . Smokeless tobacco: Not on file  . Alcohol Use: Yes  . Drug Use: No  . Sexually Active: Yes   Other Topics Concern  . Not on file   Social History Narrative   Regular exercise-no   Caffeine Use-yes     Constitutional: Denies fever, malaise, fatigue, headache or abrupt weight changes.  Respiratory: Denies difficulty breathing, shortness of breath, cough or sputum production.   Cardiovascular: Denies chest pain, chest tightness, palpitations or swelling in the hands or feet.  Gastrointestinal: Pt reports rectal bleeding. Denies  abdominal pain, bloating, constipation, diarrhea.  Neurological: Denies dizziness, difficulty with memory, difficulty with speech or problems with balance and coordination.   No other specific complaints in a complete review of systems (except as listed in HPI above).  Objective:   Physical Exam  BP 132/68  Pulse 68  Temp(Src) 97 F (36.1 C) (Oral)  Ht 6' (1.829 m)  Wt 238 lb (107.956 kg)  BMI 32.27 kg/m2  SpO2 99% Wt Readings from Last 3 Encounters:  11/16/12 238 lb (107.956 kg)  09/19/12 235 lb 6.4 oz (106.777 kg)    General: Appears his stated age, well developed, well nourished in NAD.  Cardiovascular: Normal rate and rhythm. S1,S2 noted.  No murmur, rubs or gallops noted. No JVD or BLE edema. No carotid bruits noted. Pulmonary/Chest: Normal effort and positive vesicular breath sounds. No respiratory distress. No wheezes, rales or ronchi noted.  Abdomen: Soft and nontender. Normal bowel sounds, no bruits noted. No distention or masses noted. Liver, spleen and kidneys non palpable. DRE deffered. Neurological: Alert and oriented. Cranial nerves II-XII intact. Coordination normal. +DTRs bilaterally.   BMET    Component Value Date/Time   NA 140 09/19/2012 1511   K 4.9 09/19/2012 1511   CL 105 09/19/2012 1511   CO2 28 09/19/2012 1511   GLUCOSE 121* 09/19/2012 1511   BUN 11 09/19/2012 1511   CREATININE 1.1  09/19/2012 1511   CALCIUM 9.8 09/19/2012 1511   GFRNONAA >60 06/05/2007 1540   GFRAA  Value: >60        The eGFR has been calculated using the MDRD equation. This calculation has not been validated in all clinical 06/05/2007 1540    Lipid Panel     Component Value Date/Time   CHOL 215* 09/19/2012 1511   TRIG 106.0 09/19/2012 1511   HDL 65.10 09/19/2012 1511   CHOLHDL 3 09/19/2012 1511   VLDL 21.2 09/19/2012 1511    CBC    Component Value Date/Time   WBC 7.7 09/19/2012 1511   RBC 4.91 09/19/2012 1511   HGB 16.4 09/19/2012 1511   HCT 48.1 09/19/2012 1511   PLT 255.0 09/19/2012  1511   MCV 97.9 09/19/2012 1511   MCHC 34.1 09/19/2012 1511   RDW 12.3 09/19/2012 1511    Hgb A1C Lab Results  Component Value Date   HGBA1C 5.0 09/19/2012         Assessment & Plan:  Recurrent rectal bleeding:  Will refer to GI for possible scope

## 2012-11-16 NOTE — Patient Instructions (Addendum)
We sent the prescription for the Moviprep to [facility] [address]  You have been scheduled for a colonoscopy with propofol. Please follow written instructions given to you at your visit today.  Please pick up your prep kit at the pharmacy within the next 1-3 days. If you use inhalers (even only as needed), please bring them with you on the day of your procedure. Your physician has requested that you go to www.startemmi.com and enter the access code given to you at your visit today. This web site gives a general overview about your procedure. However, you should still follow specific instructions given to you by our office regarding your preparation for the procedure.

## 2012-11-20 ENCOUNTER — Encounter: Payer: Self-pay | Admitting: Gastroenterology

## 2012-11-20 ENCOUNTER — Ambulatory Visit (AMBULATORY_SURGERY_CENTER): Payer: BC Managed Care – HMO | Admitting: Gastroenterology

## 2012-11-20 VITALS — BP 125/87 | HR 65 | Temp 98.3°F | Resp 18 | Ht 71.0 in | Wt 237.0 lb

## 2012-11-20 DIAGNOSIS — D126 Benign neoplasm of colon, unspecified: Secondary | ICD-10-CM

## 2012-11-20 DIAGNOSIS — K625 Hemorrhage of anus and rectum: Secondary | ICD-10-CM

## 2012-11-20 DIAGNOSIS — K644 Residual hemorrhoidal skin tags: Secondary | ICD-10-CM

## 2012-11-20 DIAGNOSIS — Z1211 Encounter for screening for malignant neoplasm of colon: Secondary | ICD-10-CM

## 2012-11-20 MED ORDER — PRAMOXINE-HC 1-1 % EX CREA: TOPICAL_CREAM | Freq: Three times a day (TID) | CUTANEOUS | Status: DC | PRN

## 2012-11-20 MED ORDER — SODIUM CHLORIDE 0.9 % IV SOLN: 500.0000 mL | INTRAVENOUS | Status: DC

## 2012-11-20 NOTE — Patient Instructions (Addendum)
YOU HAD AN ENDOSCOPIC PROCEDURE TODAY AT THE [facility]: Refer to the procedure report that was given to you for any specific questions about what was found during the examination.  If the procedure report does not answer your questions, please call your gastroenterologist to clarify.  If you requested that your care partner not be given the details of your procedure findings, then the procedure report has been included in a sealed envelope for you to review at your convenience later.  YOU SHOULD EXPECT: Some feelings of bloating in the abdomen. Passage of more gas than usual.  Walking can help get rid of the air that was put into your GI tract during the procedure and reduce the bloating. If you had a lower endoscopy (such as a colonoscopy or flexible sigmoidoscopy) you may notice spotting of blood in your stool or on the toilet paper. If you underwent a bowel prep for your procedure, then you may not have a normal bowel movement for a few days.  DIET: Your first meal following the procedure should be a light meal and then it is ok to progress to your normal diet.  A half-sandwich or bowl of soup is an example of a good first meal.  Heavy or fried foods are harder to digest and may make you feel nauseous or bloated.  Likewise meals heavy in dairy and vegetables can cause extra gas to form and this can also increase the bloating.  Drink plenty of fluids but you should avoid alcoholic beverages for 24 hours.  ACTIVITY: Your care partner should take you home directly after the procedure.  You should plan to take it easy, moving slowly for the rest of the day.  You can resume normal activity the day after the procedure however you should NOT DRIVE or use heavy machinery for 24 hours (because of the sedation medicines used during the test).    SYMPTOMS TO REPORT IMMEDIATELY: A gastroenterologist can be reached at any hour.  During normal business hours, 8:30 AM to 5:00 PM Monday through Friday,  call [phone].  After hours and on weekends, please call the GI answering service at [phone] EMERGENCY NUMBER  who will take a message and have the physician on call contact you.   Following lower endoscopy (colonoscopy or flexible sigmoidoscopy):  Excessive amounts of blood in the stool  Significant tenderness or worsening of abdominal pains  Swelling of the abdomen that is new, acute  Fever of 100F or higher FOLLOW UP: If any biopsies were taken you will be contacted by phone or by letter within the next 1-3 weeks.  Call your gastroenterologist if you have not heard about the biopsies in 3 weeks.  Our staff will call the home number listed on your records the next business day following your procedure to check on you and address any questions or concerns that you may have at that time regarding the information given to you following your procedure. This is a courtesy call and so if there is no answer at the home number and we have not heard from you through the emergency physician on call, we will assume that you have returned to your regular daily activities without incident.  SIGNATURES/CONFIDENTIALITY: You and/or your care partner have signed paperwork which will be entered into your electronic medical record.  These signatures attest to the fact that that the information above on your After Visit Summary has been reviewed and is understood.  Full responsibility of the confidentiality  of this discharge information lies with you and/or your care-partner.  HANDOUTS ON POLYPS, DIVERTICULOSIS, HIGH FIBER DIET, HEMORRHOIDS METAMUCIL OR BENEFIBER DAILY OVER THE COUNTER DAILY FOR FIBER. IF YOU DO THIS, PLEASE INCREASE FLUID INTAKE IN YOUR DIET I FAXED IN PRESCRIPTION TO YOUR PHARMACY FOR ANALPRAM CREAM

## 2012-11-20 NOTE — Progress Notes (Signed)
Called to room to assist during endoscopic procedure.  Patient ID and intended procedure confirmed with present staff. Received instructions for my participation in the procedure from the performing physician.  

## 2012-11-20 NOTE — Op Note (Signed)
[facility] [address] [place], [address]   COLONOSCOPY PROCEDURE REPORT  PATIENT: [name], [name]  MR#: [id number] BIRTHDATE: 11/01/1965 , 46  yrs. old GENDER: Male ENDOSCOPIST: Mardella Layman, MD, [facility] REFERRED BY:  Willette Cluster, ACNP-BC PROCEDURE DATE:  11/20/2012 PROCEDURE:   Colonoscopy with biopsy ASA CLASS:   Class II INDICATIONS:Average risk patient for colon cancer and Rectal Bleeding. MEDICATIONS: propofol (Diprivan) 350mg  IV  DESCRIPTION OF PROCEDURE:   After the risks and benefits and of the procedure were explained, informed consent was obtained.  A digital rectal exam revealed external hemorrhoids.    The LB CF-[id number]  endoscope was introduced through the anus and advanced to the cecum, which was identified by both the appendix and ileocecal valve .  The quality of the prep was excellent, using MoviPrep . The instrument was then slowly withdrawn as the colon was fully examined.     COLON FINDINGS: Mild diverticulosis was noted in the sigmoid colon. Three small smooth flat polyps were found in the sigmoid colon and rectum.  A polypectomy was performed.  Multiple biopsies were performed using cold forceps.     Retroflexed views revealed external hemorrhoids.     The scope was then withdrawn from the patient and the procedure completed.  COMPLICATIONS: There were no complications. ENDOSCOPIC IMPRESSION: 1.   Mild diverticulosis was noted in the sigmoid colon 2.   Three small flat polyps were found in the sigmoid colon and rectum; polypectomy was performed; multiple biopsies were performed using cold forceps ...r/o adenomas 3.  external hemorrhoids RECOMMENDATIONS: 1.  Await pathology results 2.  Repeat colonoscopy in 5 years if polyp adenomatous; otherwise 10 years 3.  High fiber diet with liberal fluid intake. 4.  Metamucil or benefiber 5. prn analpram cream  REPEAT EXAM:  cc:    Nicki Reaper  _______________________________ eSigned:  Mardella Layman, MD, [facility] 11/20/2012 3:27 PM     PATIENT NAME:  [name], [name] MR#: [id number]

## 2012-11-20 NOTE — Progress Notes (Signed)
Patient did not experience any of the following events: a burn prior to discharge; a fall within the facility; wrong site/side/patient/procedure/implant event; or a hospital transfer or hospital admission upon discharge from the facility. (G8907)Patient did not have preoperative order for IV antibiotic SSI prophylaxis. (G8918) EWM 

## 2012-11-21 ENCOUNTER — Telehealth: Payer: Self-pay

## 2012-11-21 NOTE — Telephone Encounter (Signed)
  Follow up Call-  Call back number 11/20/2012  Post procedure Call Back phone  # [phone]  Permission to leave phone message Yes     Patient questions:  Do you have a fever, pain , or abdominal swelling? no Pain Score  0 *  Have you tolerated food without any problems? yes  Have you been able to return to your normal activities? yes  Do you have any questions about your discharge instructions: Diet   no Medications  no Follow up visit  no  Do you have questions or concerns about your Care? no  Actions: * If pain score is 4 or above: No action needed, pain <4.

## 2012-11-24 ENCOUNTER — Encounter: Payer: Self-pay | Admitting: Gastroenterology

## 2013-01-04 ENCOUNTER — Encounter (HOSPITAL_COMMUNITY): Payer: Self-pay | Admitting: Pharmacy Technician

## 2013-01-08 NOTE — H&P (Signed)
CHIEF COMPLAINT:  Painful right hip.    HISTORY OF PRESENT ILLNESS:  [name] is a pleasant 47 year old male who is seen for evaluation of his hip and knee pain on the right.  He has been noticing periods of pain especially when he is walking, which is in the anterior portion of his thigh.  He has had x-rays originally of his knee, which really did not show much, but had an x-ray performed of his right hip, which revealed some changes.  An MRI was ordered.  This revealed AVN of both hips, right worse than left.  He has been taking ibuprofen and some Tylenol at times but this only minimally helped for a while.  His pain sometimes so bad that he just can only essentially stay in bed or sitting.  He has had no history of injury or trauma.  He has pain with every step.  Pain is moderately severe, more of a throbbing, aching pain with occasional sharpness at times.  Nothing seems to be really helping him at this time.  Ambulation, of course, worsens his symptoms.  Seen today for evaluation.     At this time, I have reviewed a claims form by Nicki Reaper, nurse practitioner, who feels that he is cleared from medical and cardiac standpoint for a total hip replacement.  I have just received this.   PAST MEDICAL HISTORY:  In general, his health is good.     PAST SURGICAL HISTORY:  In 2009, bilateral carpal tunnels.     CURRENT MEDICATIONS:  Ibuprofen 200 mg 2-3 times a day and hydrocodone 5/325 p.r.n.    ALLERGIES:  Vicodin, so he states.  He feels very poorly on this, that is why he only takes it intermittently if he does at all.    FAMILY HISTORY:  Positive for a mother who died at age 71 from COPD and a father who died at age 35 in [place].  He has no brothers and he has 1 living sister at age 62.    SOCIAL HISTORY:  A 38 year old white male who is married.  He does CSR.  He smoked 1 pack of cigarettes per day for 14 years, quit in 2001.  He drinks an 8-pack of beer a day.     REVIEW OF SYSTEMS:  A  14-point review of systems is negative except for some leg cramps.    PHYSICAL EXAMINATION:  A 47 year old white male, well developed, well nourished, alert, pleasant, cooperative, in moderate distress secondary to right hip and groin pain.  He is 6 feet 0 inches, weighs 230 pounds with a BMI of 31.2.   Vital signs:  Temperature 98.2, pulse 80, respirations 16, BP 140/96.  This is a manual reading.   Head:  Normocephalic.   Eyes:  Pupils equal, round and reactive to light and accomodation with extraocular movements intact.   Ears, nose, and throat:  Benign.   Neck:  Supple.  No thyromegaly or carotids.   Chest:  Had good expansion.   Lungs:  Essentially clear.   Cardiac:  Had a regular rhythm and rate.  Normal S1, S2.  No murmurs appreciated.  Pulses were 1+, bilateral and symmetric in the lower extremity.   CNS:  He is alert and oriented x3 with cranial nerves II-XII grossly intact.   Genitorectal and breast exam:  Not indicated for an orthopedic evaluation.   Musculoskeletal:  Today he has minimal range of motion with internal and external rotation of his right hip.  He certainly has painful range of motion.  I can get him to about 110 degrees of forward flexion.  He is neurovascularly intact distally.     RADIOGRAPHS:  MRI of the right hip and left hip reveals bilateral hip AVN, right more affected and more acute findings with edema and joint effusion.     CLINICAL IMPRESSION:   1.  Avascular necrosis of the hips, right worse than left. 2.  Previous cigarette smoker. 3.  A beer drinker.    RECOMMENDATIONS:   At this time, because of his AVN, I feel that he is a candidate for a right total hip arthroplasty.  Procedure risks and benefits were fully explained to him and all questions were answered.  I have gone over his hospital stay and his rehab also with him.  I showed him the prosthesis that we would be using and he is understanding.  We will plan for the near future to prepare him for  right total hip arthroplasty.     Oris Drone Aleda Grana [facility] [phone]  01/08/2013 1:15 PM

## 2013-01-08 NOTE — Pre-Procedure Instructions (Signed)
[name]  01/08/2013   Your procedure is scheduled on:  Thursday January 11, 2013.  Report to [facility] at 9:00 AM.  Call this number if you have problems the morning of surgery: [phone]   Remember:   Do not eat food or drink liquids after midnight.   Take these medicines the morning of surgery with A SIP OF WATER: Oxycodone and Tramadol if needed for pain   Do not wear jewelry  Do not wear lotions, powders, or colognes.  Men may shave face and neck.  Do not bring valuables to the hospital.  [facility] is not responsible for any belongings or valuables.  Contacts, dentures or bridgework may not be worn into surgery.  Leave suitcase in the car. After surgery it may be brought to your room.  For patients admitted to the hospital, checkout time is 11:00 AM the day of discharge.   Patients discharged the day of surgery will not be allowed to drive home.  Name and phone number of your driver: Family/Friend  Special Instructions: Shower using CHG 2 nights before surgery and the night before surgery.  If you shower the day of surgery use CHG.  Use special wash - you have one bottle of CHG for all showers.  You should use approximately 1/3 of the bottle for each shower.   Please read over the following fact sheets that you were given: Pain Booklet, Coughing and Deep Breathing, Blood Transfusion Information, MRSA Information and Surgical Site Infection Prevention

## 2013-01-09 ENCOUNTER — Encounter (HOSPITAL_COMMUNITY): Payer: Self-pay

## 2013-01-09 ENCOUNTER — Encounter (HOSPITAL_COMMUNITY)
Admission: RE | Admit: 2013-01-09 | Discharge: 2013-01-09 | Disposition: A | Discharge status: Home / Self Care | Payer: BC Managed Care – PPO | Source: Ambulatory Visit | Attending: Orthopaedic Surgery | Admitting: Orthopaedic Surgery

## 2013-01-09 ENCOUNTER — Encounter (HOSPITAL_COMMUNITY)
Admission: RE | Admit: 2013-01-09 | Discharge: 2013-01-09 | Disposition: A | Discharge status: Home / Self Care | Payer: BC Managed Care – PPO | Source: Ambulatory Visit | Attending: Orthopedic Surgery | Admitting: Orthopedic Surgery

## 2013-01-09 LAB — COMPREHENSIVE METABOLIC PANEL
Albumin: 3.9 g/dL (ref 3.5–5.2)
Alkaline Phosphatase: 85 U/L (ref 39–117)
BUN: 11 mg/dL (ref 6–23)
Calcium: 9 mg/dL (ref 8.4–10.5)
Potassium: 4.2 mEq/L (ref 3.5–5.1)
Total Protein: 7.3 g/dL (ref 6.0–8.3)

## 2013-01-09 LAB — URINALYSIS, ROUTINE W REFLEX MICROSCOPIC
Bilirubin Urine: NEGATIVE
Ketones, ur: NEGATIVE mg/dL
Nitrite: NEGATIVE
Protein, ur: NEGATIVE mg/dL
Urobilinogen, UA: 0.2 mg/dL (ref 0.0–1.0)

## 2013-01-09 LAB — TYPE AND SCREEN
ABO/RH(D): O POS
Antibody Screen: NEGATIVE

## 2013-01-09 LAB — APTT: aPTT: 29 seconds (ref 24–37)

## 2013-01-09 LAB — PROTIME-INR
INR: 0.99 (ref 0.00–1.49)
Prothrombin Time: 13 seconds (ref 11.6–15.2)

## 2013-01-09 LAB — CBC
HCT: 42.7 % (ref 39.0–52.0)
MCHC: 36.5 g/dL — ABNORMAL HIGH (ref 30.0–36.0)
RDW: 12.3 % (ref 11.5–15.5)

## 2013-01-09 LAB — SURGICAL PCR SCREEN: Staphylococcus aureus: NEGATIVE

## 2013-01-09 LAB — ABO/RH: ABO/RH(D): O POS

## 2013-01-09 IMAGING — CR DG CHEST 2V
2 series · 2 of 2 positions shown · non-contrast
Comparison: [DATE]

CLINICAL DATA: Preoperative evaluation for hip replacement

CHEST - 2 VIEW

[view not recorded (1 of 2)]
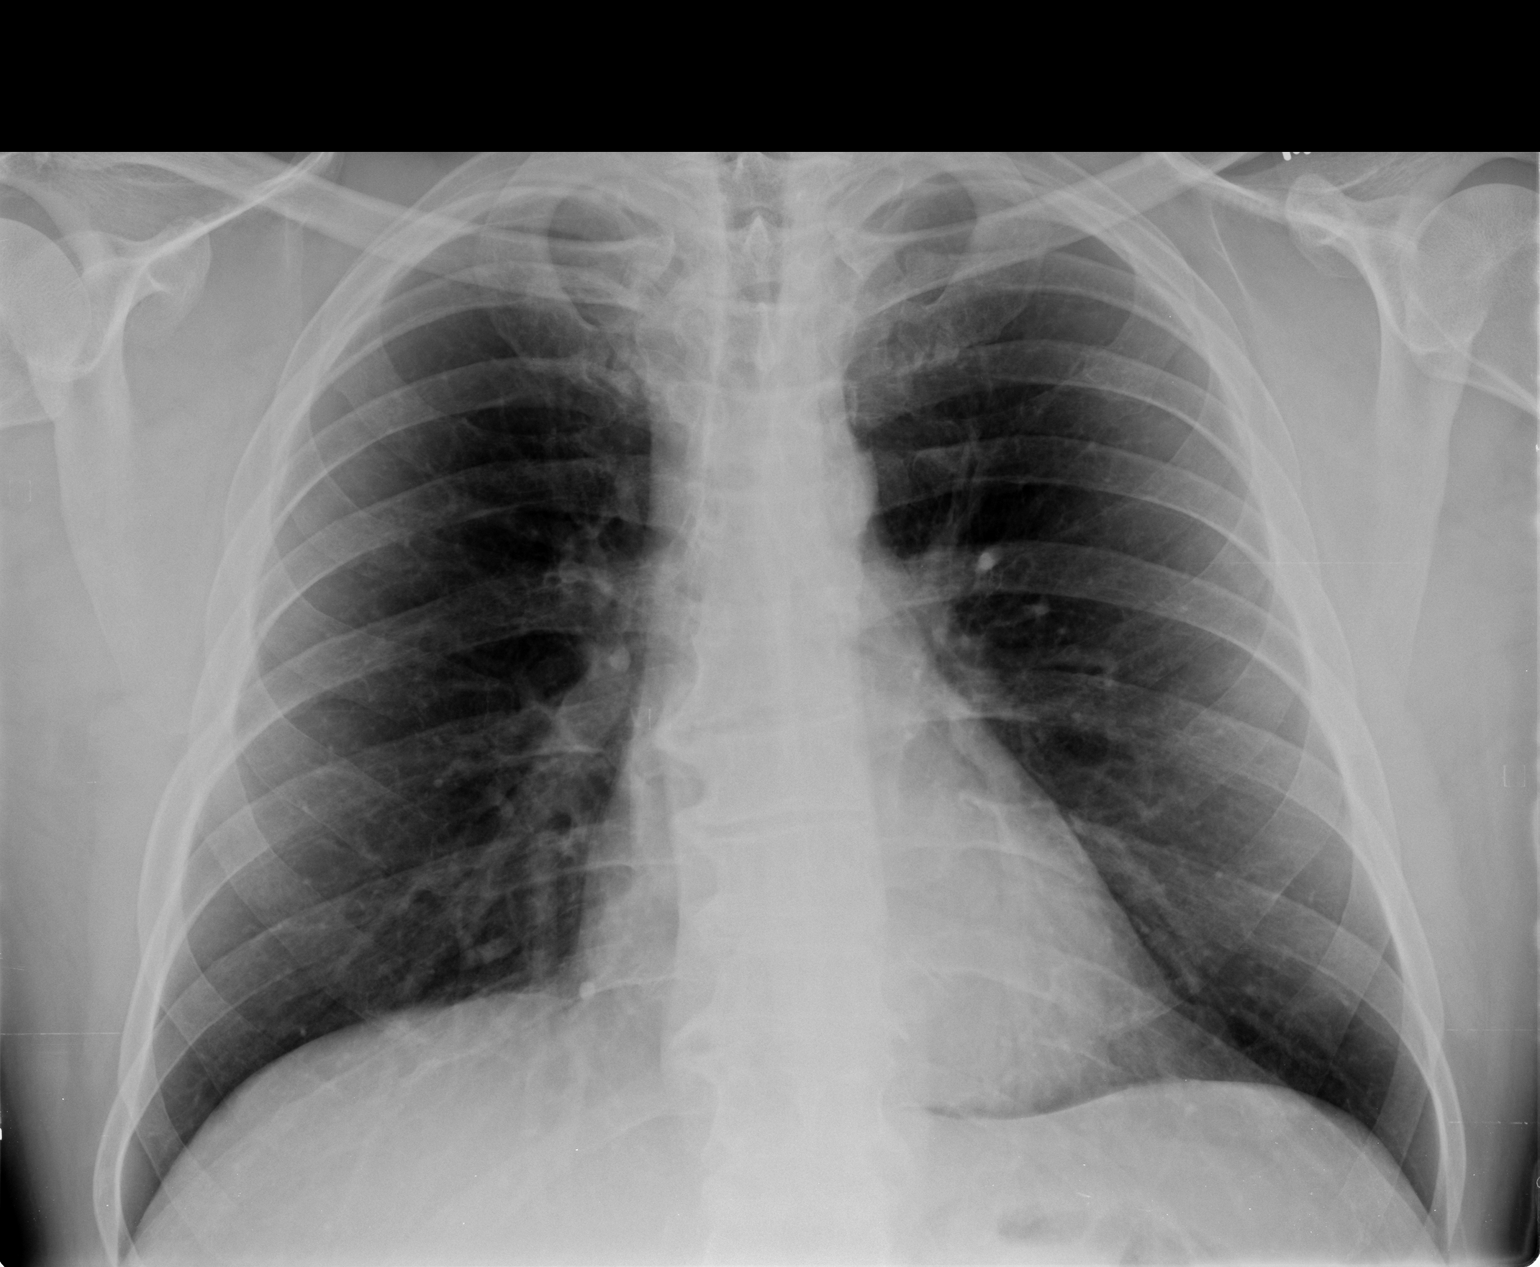

[view not recorded (2 of 2)]
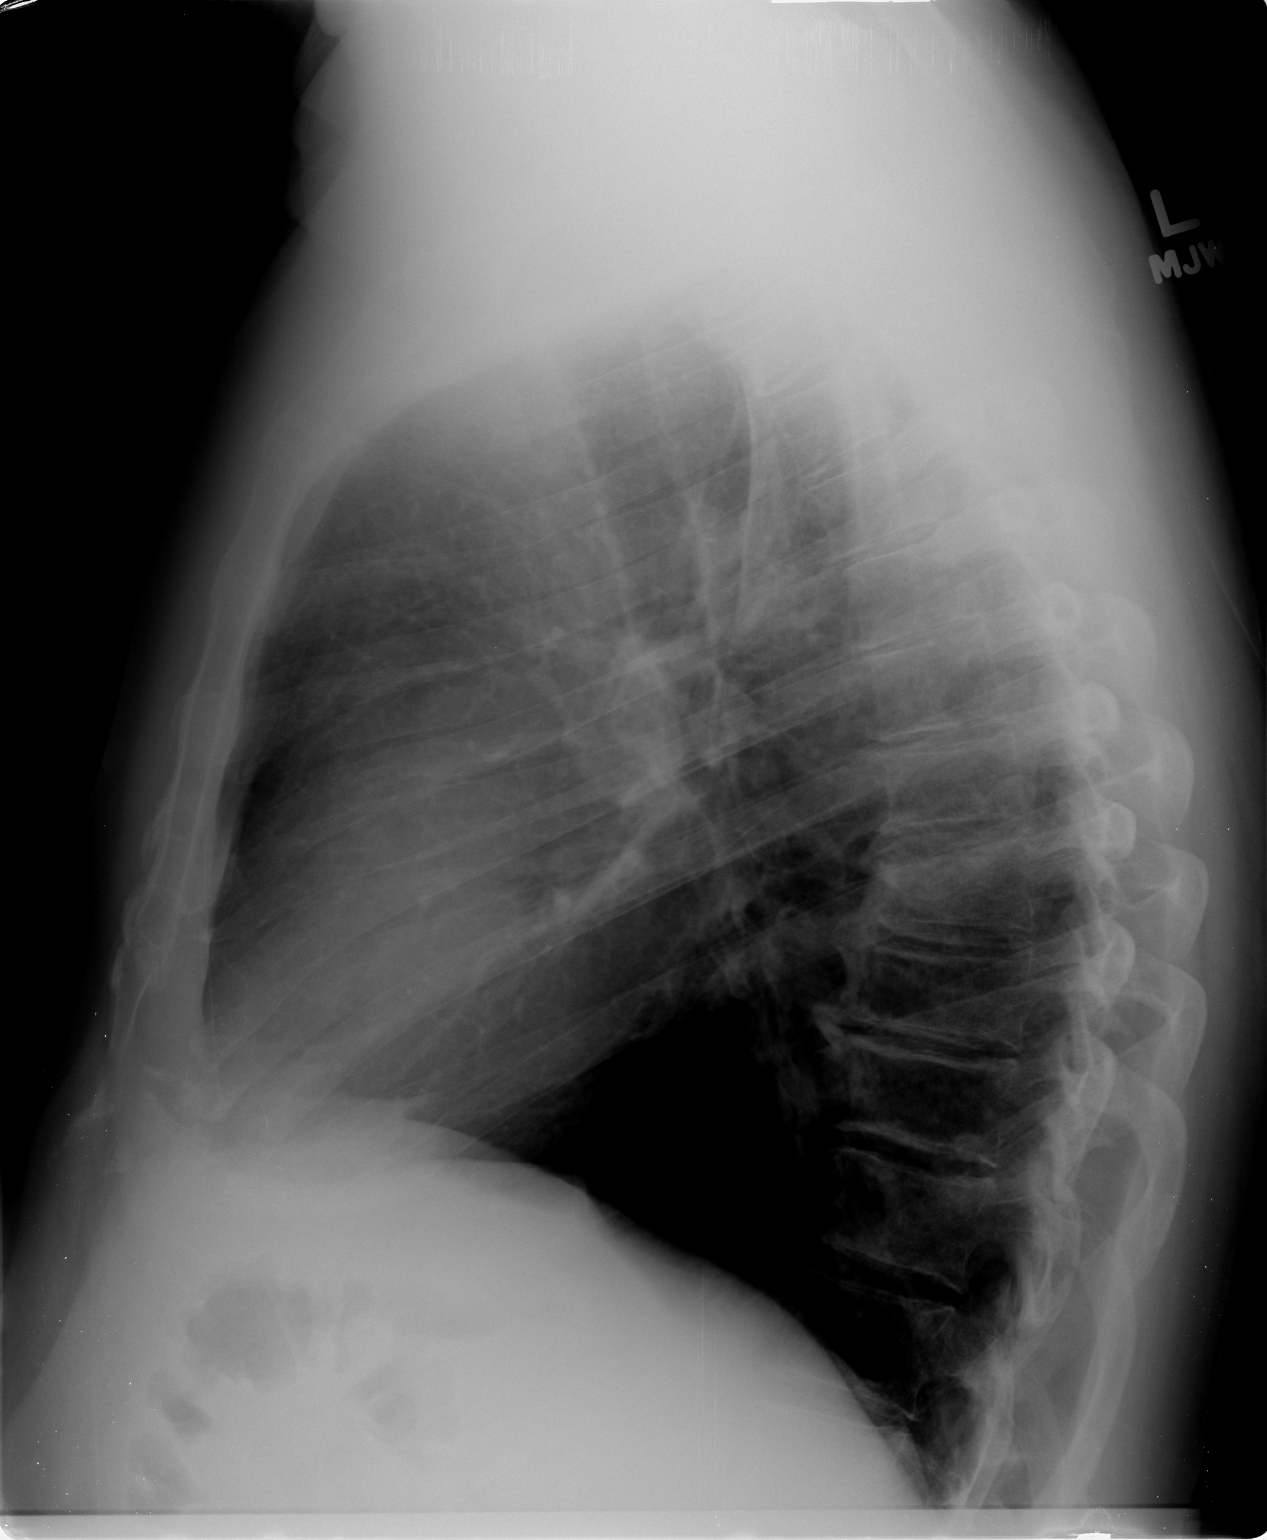

[2 of 2 positions shown; findings below may reference images not displayed]

FINDINGS: The heart and pulmonary vascularity are within normal
limits.  The lungs are well-aerated bilaterally without focal
infiltrate.  No acute bony abnormality is seen.
IMPRESSION: No acute abnormality noted.

## 2013-01-09 NOTE — Progress Notes (Signed)
Patient denied having a PCP, stress test, cardiac cath, or sleep study.

## 2013-01-10 LAB — URINE CULTURE: Culture: NO GROWTH

## 2013-01-10 MED ORDER — DEXTROSE 5 % IV SOLN
3.0000 g | INTRAVENOUS | Status: AC
  Administered 2013-01-11: 3 g via INTRAVENOUS
  Filled 2013-01-10: qty 3000

## 2013-01-11 ENCOUNTER — Ambulatory Visit (HOSPITAL_COMMUNITY): Payer: BC Managed Care – PPO

## 2013-01-11 ENCOUNTER — Encounter (HOSPITAL_COMMUNITY): Payer: Self-pay | Admitting: Certified Registered"

## 2013-01-11 ENCOUNTER — Inpatient Hospital Stay (HOSPITAL_COMMUNITY)
Admission: RE | Admit: 2013-01-11 | Discharge: 2013-01-13 | DRG: 818 | Disposition: A | Discharge status: Home / Self Care | Payer: BC Managed Care – PPO | Source: Ambulatory Visit | Attending: Orthopaedic Surgery | Admitting: Orthopaedic Surgery

## 2013-01-11 ENCOUNTER — Ambulatory Visit (HOSPITAL_COMMUNITY): Payer: BC Managed Care – PPO | Admitting: Certified Registered"

## 2013-01-11 ENCOUNTER — Encounter (HOSPITAL_COMMUNITY)
Admission: RE | Disposition: A | Discharge status: Home / Self Care | Payer: Self-pay | Source: Ambulatory Visit | Attending: Orthopaedic Surgery

## 2013-01-11 ENCOUNTER — Encounter (HOSPITAL_COMMUNITY): Payer: Self-pay | Admitting: *Deleted

## 2013-01-11 DIAGNOSIS — M87059 Idiopathic aseptic necrosis of unspecified femur: Principal | ICD-10-CM | POA: Diagnosis present

## 2013-01-11 DIAGNOSIS — D62 Acute posthemorrhagic anemia: Secondary | ICD-10-CM | POA: Diagnosis not present

## 2013-01-11 DIAGNOSIS — M87051 Idiopathic aseptic necrosis of right femur: Secondary | ICD-10-CM

## 2013-01-11 DIAGNOSIS — F17200 Nicotine dependence, unspecified, uncomplicated: Secondary | ICD-10-CM | POA: Diagnosis present

## 2013-01-11 HISTORY — PX: TOTAL HIP ARTHROPLASTY: SHX124

## 2013-01-11 HISTORY — DX: Other specified health status: Z78.9

## 2013-01-11 IMAGING — DX DG PORTABLE PELVIS
1 series · 1 of 1 positions shown · non-contrast
Comparison: None.

CLINICAL DATA: Postop right hip

PORTABLE PELVIS

[ap]
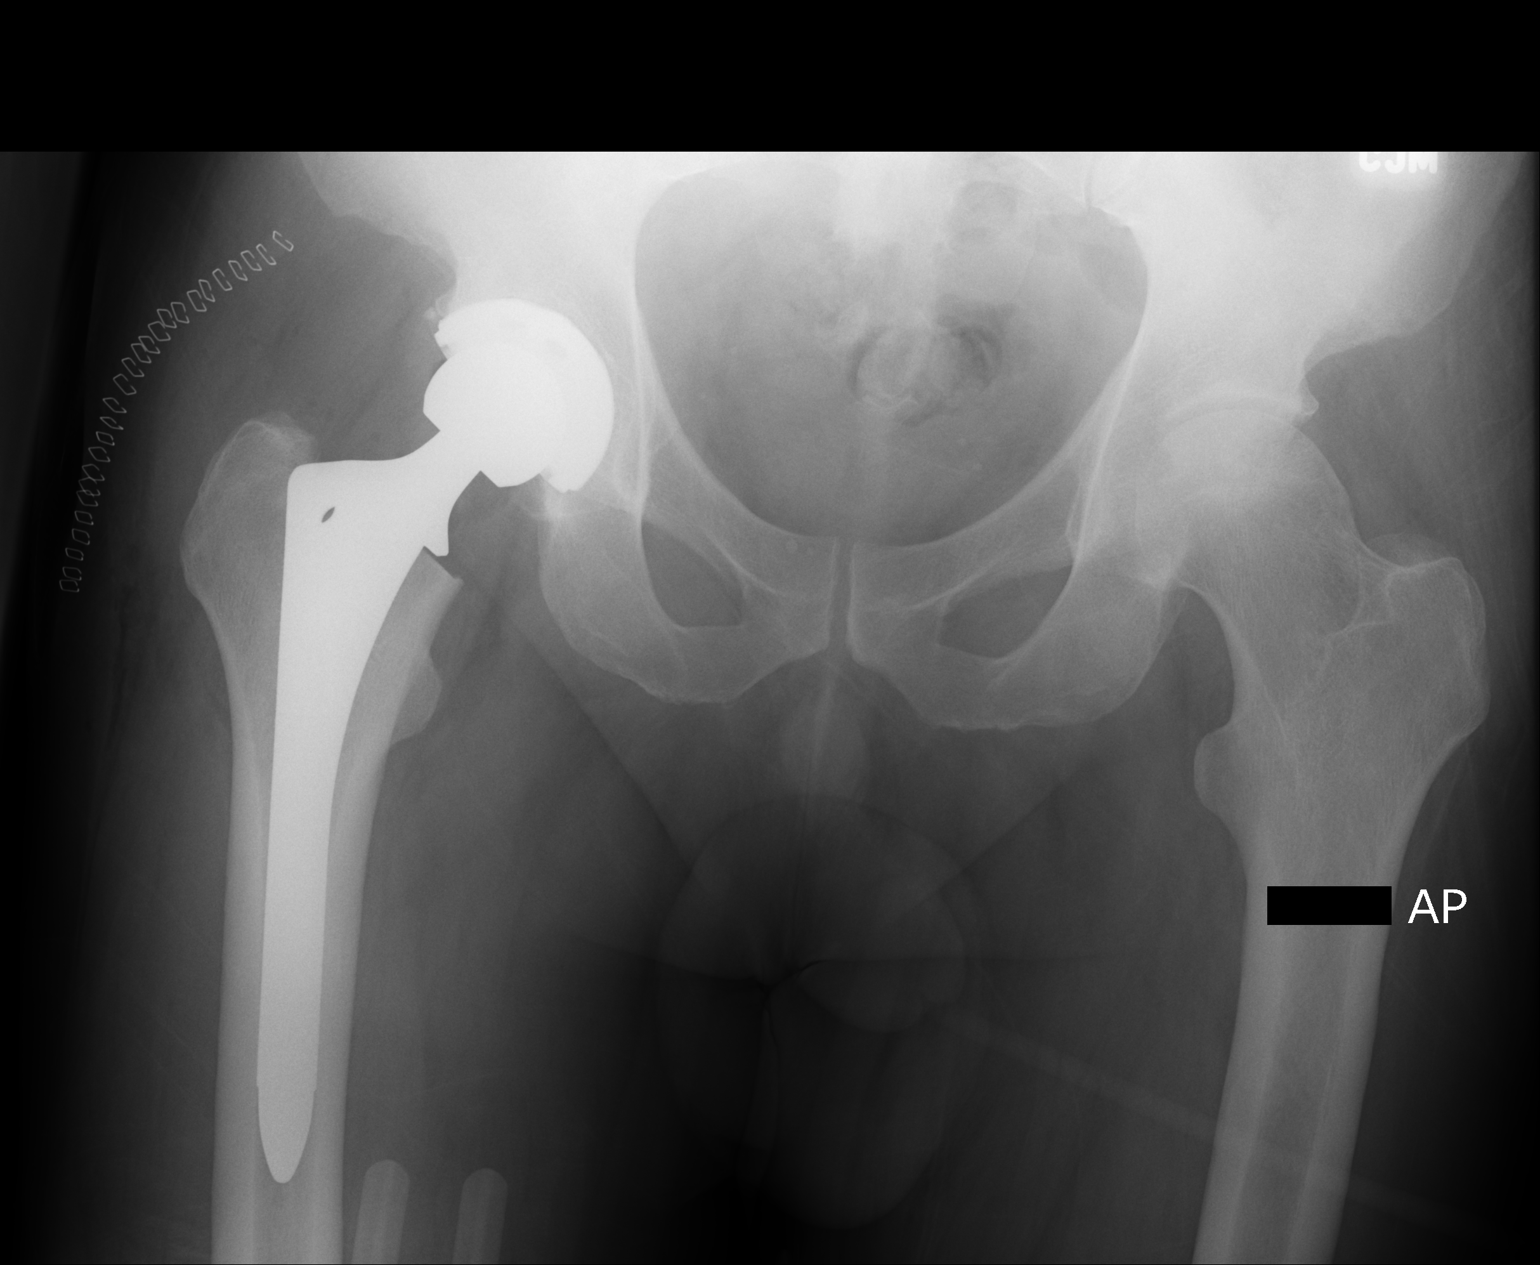

[1 of 1 positions shown; findings below may reference images not displayed]

FINDINGS: Right total hip arthroplasty in satisfactory position on
this single frontal view.

Overlying skin staples.

Mild degenerative changes of the left hip.
IMPRESSION: Right total hip arthroplasty in satisfactory position.

## 2013-01-11 IMAGING — DX DG HIP 1V PORT*R*
1 series · 1 of 1 positions shown · non-contrast
Comparison: Portable cross-table lateral view [TL] hours compared
to [DATE] pelvic radiograph

CLINICAL DATA: Post hip replacement

PORTABLE RIGHT HIP - 1 VIEW

[lat]
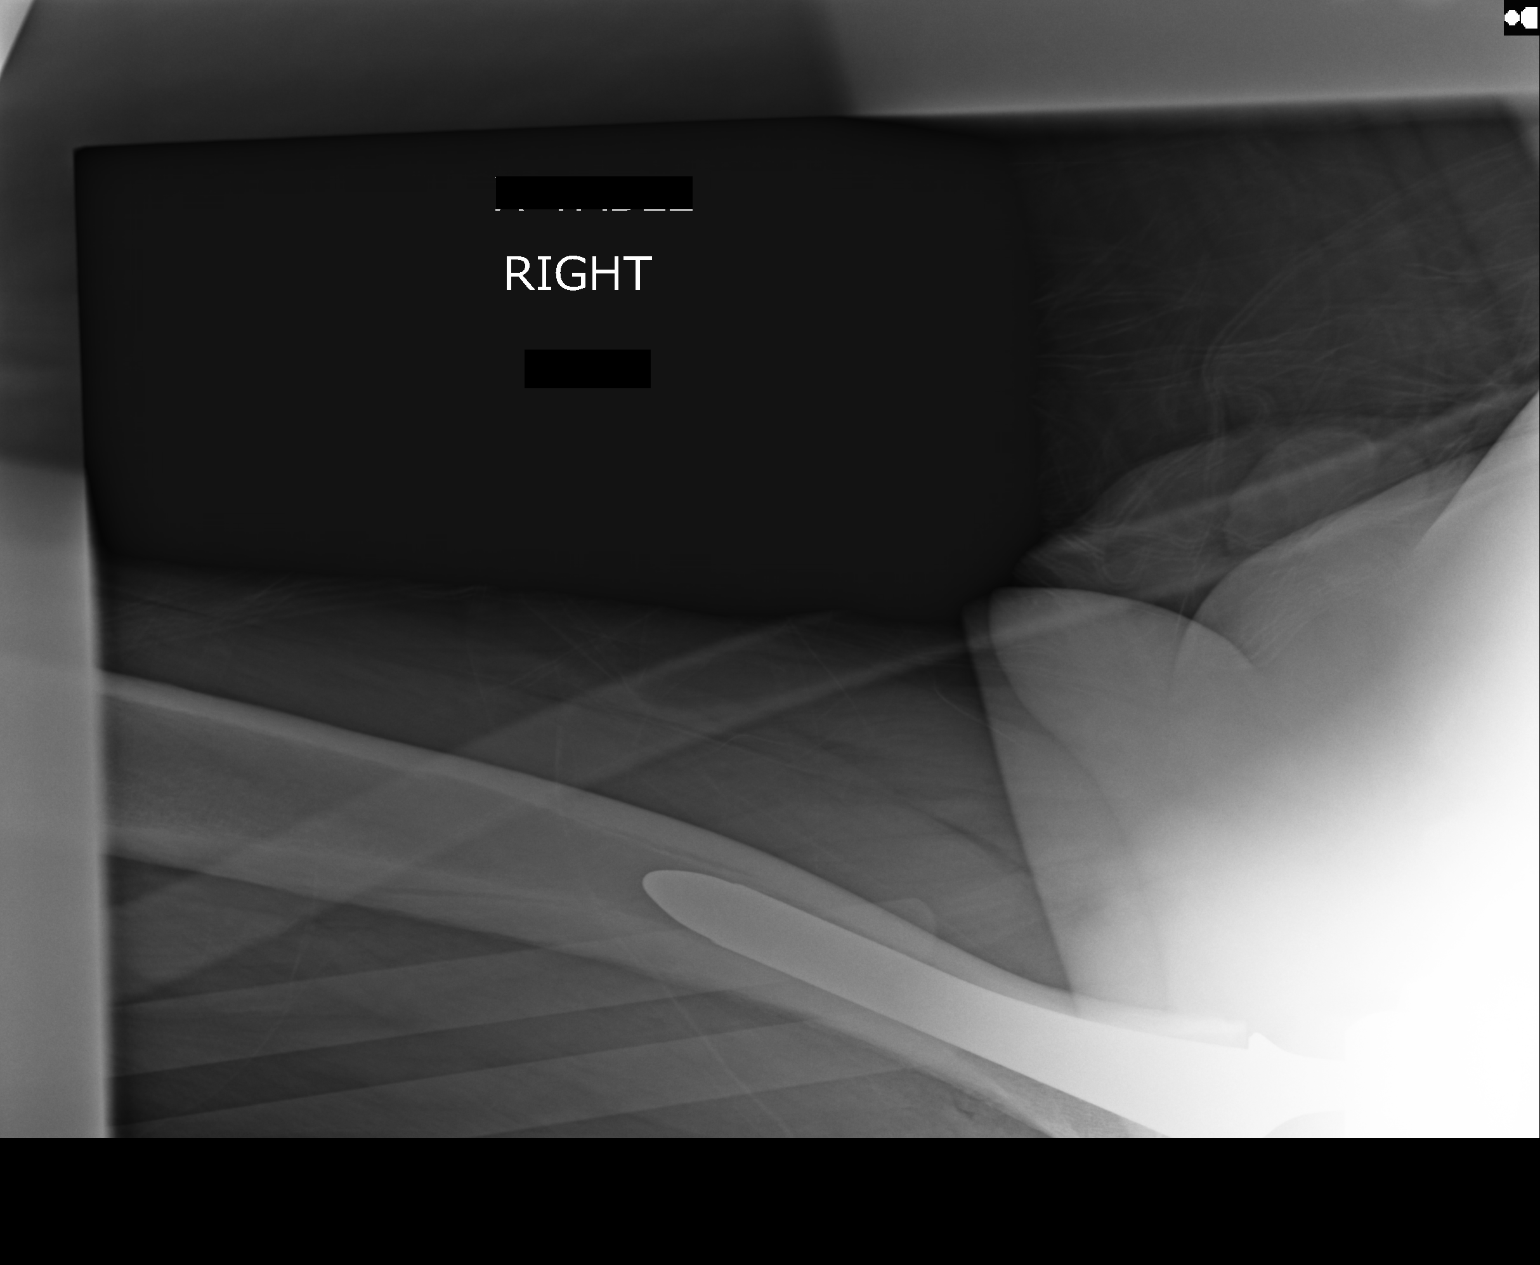

[1 of 1 positions shown; findings below may reference images not displayed]

FINDINGS: Femoral component of a right hip prosthesis is identified.
No fracture, dislocation or bone destruction.
IMPRESSION: Right hip prosthesis without acute complication.

## 2013-01-11 SURGERY — ARTHROPLASTY, HIP, TOTAL,POSTERIOR APPROACH
Anesthesia: General | Site: Hip | Laterality: Right | Wound class: Clean

## 2013-01-11 MED ORDER — MENTHOL 3 MG MT LOZG: 1.0000 | LOZENGE | OROMUCOSAL | Status: DC | PRN

## 2013-01-11 MED ORDER — MIDAZOLAM HCL 2 MG/2ML IJ SOLN
INTRAMUSCULAR | Status: AC
  Filled 2013-01-11: qty 2

## 2013-01-11 MED ORDER — LIDOCAINE HCL (CARDIAC) 20 MG/ML IV SOLN
INTRAVENOUS | Status: DC | PRN
  Administered 2013-01-11: 30 mg via INTRAVENOUS

## 2013-01-11 MED ORDER — CEFAZOLIN SODIUM-DEXTROSE 2-3 GM-% IV SOLR
2.0000 g | Freq: Four times a day (QID) | INTRAVENOUS | Status: AC
  Administered 2013-01-11 – 2013-01-12 (×2): 2 g via INTRAVENOUS
  Filled 2013-01-11 (×2): qty 50

## 2013-01-11 MED ORDER — SUFENTANIL CITRATE 50 MCG/ML IV SOLN
INTRAVENOUS | Status: DC | PRN
  Administered 2013-01-11: 30 ug via INTRAVENOUS
  Administered 2013-01-11 (×6): 10 ug via INTRAVENOUS

## 2013-01-11 MED ORDER — PROMETHAZINE HCL 25 MG/ML IJ SOLN: 6.2500 mg | INTRAMUSCULAR | Status: DC | PRN

## 2013-01-11 MED ORDER — OXYCODONE HCL 5 MG PO TABS
ORAL_TABLET | ORAL | Status: AC
  Filled 2013-01-11: qty 2

## 2013-01-11 MED ORDER — ALUM & MAG HYDROXIDE-SIMETH 200-200-20 MG/5ML PO SUSP: 30.0000 mL | ORAL | Status: DC | PRN

## 2013-01-11 MED ORDER — BUPIVACAINE-EPINEPHRINE PF 0.25-1:200000 % IJ SOLN
INTRAMUSCULAR | Status: AC
  Filled 2013-01-11: qty 30

## 2013-01-11 MED ORDER — PHENYLEPHRINE HCL 10 MG/ML IJ SOLN
INTRAMUSCULAR | Status: DC | PRN
  Administered 2013-01-11: 80 ug via INTRAVENOUS

## 2013-01-11 MED ORDER — METOCLOPRAMIDE HCL 5 MG/ML IJ SOLN: 5.0000 mg | Freq: Three times a day (TID) | INTRAMUSCULAR | Status: DC | PRN

## 2013-01-11 MED ORDER — ONDANSETRON HCL 4 MG/2ML IJ SOLN
INTRAMUSCULAR | Status: DC | PRN
  Administered 2013-01-11: 4 mg via INTRAVENOUS

## 2013-01-11 MED ORDER — SODIUM CHLORIDE 0.9 % IV SOLN
75.0000 mL/h | INTRAVENOUS | Status: DC
  Administered 2013-01-12: 75 mL/h via INTRAVENOUS

## 2013-01-11 MED ORDER — OXYCODONE HCL 5 MG PO TABS
5.0000 mg | ORAL_TABLET | Freq: Once | ORAL | Status: AC | PRN
  Administered 2013-01-11: 10 mg via ORAL

## 2013-01-11 MED ORDER — ROCURONIUM BROMIDE 100 MG/10ML IV SOLN
INTRAVENOUS | Status: DC | PRN
  Administered 2013-01-11: 50 mg via INTRAVENOUS
  Administered 2013-01-11: 10 mg via INTRAVENOUS
  Administered 2013-01-11: 20 mg via INTRAVENOUS

## 2013-01-11 MED ORDER — METHOCARBAMOL 100 MG/ML IJ SOLN
500.0000 mg | Freq: Four times a day (QID) | INTRAVENOUS | Status: DC | PRN
  Administered 2013-01-12: 500 mg via INTRAVENOUS
  Filled 2013-01-11: qty 5

## 2013-01-11 MED ORDER — DOCUSATE SODIUM 100 MG PO CAPS
100.0000 mg | ORAL_CAPSULE | Freq: Two times a day (BID) | ORAL | Status: DC
  Administered 2013-01-11 – 2013-01-12 (×3): 100 mg via ORAL
  Filled 2013-01-11 (×5): qty 1

## 2013-01-11 MED ORDER — METHOCARBAMOL 500 MG PO TABS
500.0000 mg | ORAL_TABLET | Freq: Four times a day (QID) | ORAL | Status: DC | PRN
  Administered 2013-01-11 – 2013-01-13 (×4): 500 mg via ORAL
  Filled 2013-01-11 (×5): qty 1

## 2013-01-11 MED ORDER — MIDAZOLAM HCL 2 MG/2ML IJ SOLN
0.5000 mg | Freq: Once | INTRAMUSCULAR | Status: AC | PRN
  Administered 2013-01-11: 2 mg via INTRAVENOUS

## 2013-01-11 MED ORDER — METOCLOPRAMIDE HCL 10 MG PO TABS: 5.0000 mg | ORAL_TABLET | Freq: Three times a day (TID) | ORAL | Status: DC | PRN

## 2013-01-11 MED ORDER — MEPERIDINE HCL 25 MG/ML IJ SOLN: 6.2500 mg | INTRAMUSCULAR | Status: DC | PRN

## 2013-01-11 MED ORDER — ACETAMINOPHEN 325 MG PO TABS
650.0000 mg | ORAL_TABLET | Freq: Once | ORAL | Status: AC
  Administered 2013-01-11: 650 mg via ORAL
  Filled 2013-01-11: qty 2

## 2013-01-11 MED ORDER — MIDAZOLAM HCL 5 MG/5ML IJ SOLN
INTRAMUSCULAR | Status: DC | PRN
  Administered 2013-01-11: 2 mg via INTRAVENOUS

## 2013-01-11 MED ORDER — HYDROMORPHONE HCL PF 1 MG/ML IJ SOLN
1.0000 mg | INTRAMUSCULAR | Status: DC | PRN
  Administered 2013-01-11 – 2013-01-12 (×6): 1 mg via INTRAVENOUS
  Filled 2013-01-11 (×6): qty 1

## 2013-01-11 MED ORDER — PROPOFOL 10 MG/ML IV BOLUS
INTRAVENOUS | Status: DC | PRN
  Administered 2013-01-11: 200 mg via INTRAVENOUS

## 2013-01-11 MED ORDER — MAGNESIUM HYDROXIDE 400 MG/5ML PO SUSP: 30.0000 mL | Freq: Every day | ORAL | Status: DC | PRN

## 2013-01-11 MED ORDER — OXYCODONE HCL 5 MG/5ML PO SOLN: 5.0000 mg | Freq: Once | ORAL | Status: AC | PRN

## 2013-01-11 MED ORDER — CHLORHEXIDINE GLUCONATE 4 % EX LIQD: 60.0000 mL | Freq: Every day | CUTANEOUS | Status: DC

## 2013-01-11 MED ORDER — PHENOL 1.4 % MT LIQD: 1.0000 | OROMUCOSAL | Status: DC | PRN

## 2013-01-11 MED ORDER — KETOROLAC TROMETHAMINE 15 MG/ML IJ SOLN
15.0000 mg | Freq: Four times a day (QID) | INTRAMUSCULAR | Status: DC
  Administered 2013-01-11 – 2013-01-12 (×3): 15 mg via INTRAVENOUS
  Filled 2013-01-11 (×4): qty 1

## 2013-01-11 MED ORDER — BUPIVACAINE-EPINEPHRINE PF 0.25-1:200000 % IJ SOLN
INTRAMUSCULAR | Status: DC | PRN
  Administered 2013-01-11: 30 mL

## 2013-01-11 MED ORDER — SODIUM CHLORIDE 0.9 % IR SOLN
Status: DC | PRN
  Administered 2013-01-11: 1000 mL

## 2013-01-11 MED ORDER — CHLORHEXIDINE GLUCONATE 4 % EX LIQD: 60.0000 mL | Freq: Once | CUTANEOUS | Status: DC

## 2013-01-11 MED ORDER — SODIUM CHLORIDE 0.9 % IV SOLN: INTRAVENOUS | Status: DC

## 2013-01-11 MED ORDER — LACTATED RINGERS IV SOLN
INTRAVENOUS | Status: DC
  Administered 2013-01-11 (×2): via INTRAVENOUS

## 2013-01-11 MED ORDER — HYDROMORPHONE HCL PF 1 MG/ML IJ SOLN
INTRAMUSCULAR | Status: AC
  Filled 2013-01-11: qty 1

## 2013-01-11 MED ORDER — ONDANSETRON HCL 4 MG/2ML IJ SOLN: 4.0000 mg | Freq: Four times a day (QID) | INTRAMUSCULAR | Status: DC | PRN

## 2013-01-11 MED ORDER — SPIRITUS FRUMENTI
1.0000 | ORAL | Status: DC | PRN
  Filled 2013-01-11 (×2): qty 1

## 2013-01-11 MED ORDER — RIVAROXABAN 10 MG PO TABS
10.0000 mg | ORAL_TABLET | ORAL | Status: DC
  Administered 2013-01-12 – 2013-01-13 (×2): 10 mg via ORAL
  Filled 2013-01-11 (×3): qty 1

## 2013-01-11 MED ORDER — BISACODYL 10 MG RE SUPP: 10.0000 mg | Freq: Every day | RECTAL | Status: DC | PRN

## 2013-01-11 MED ORDER — ONDANSETRON HCL 4 MG PO TABS: 4.0000 mg | ORAL_TABLET | Freq: Four times a day (QID) | ORAL | Status: DC | PRN

## 2013-01-11 MED ORDER — OXYCODONE HCL 5 MG PO TABS
5.0000 mg | ORAL_TABLET | ORAL | Status: DC | PRN
  Administered 2013-01-11 – 2013-01-13 (×6): 10 mg via ORAL
  Filled 2013-01-11 (×6): qty 2

## 2013-01-11 MED ORDER — HYDROMORPHONE HCL PF 1 MG/ML IJ SOLN
0.2500 mg | INTRAMUSCULAR | Status: AC | PRN
  Administered 2013-01-11 (×8): 0.5 mg via INTRAVENOUS

## 2013-01-11 MED ORDER — FLEET ENEMA 7-19 GM/118ML RE ENEM: 1.0000 | ENEMA | Freq: Once | RECTAL | Status: AC | PRN

## 2013-01-11 SURGICAL SUPPLY — 60 items
BLADE SAW SAG 73X25 THK (BLADE) ×1
BLADE SAW SGTL 73X25 THK (BLADE) ×1 IMPLANT
BRUSH FEMORAL CANAL (MISCELLANEOUS) IMPLANT
CAPT HIP PF MOP ×1 IMPLANT
CLOTH BEACON ORANGE TIMEOUT ST (SAFETY) ×2 IMPLANT
COVER BACK TABLE 24X17X13 BIG (DRAPES) IMPLANT
COVER SURGICAL LIGHT HANDLE (MISCELLANEOUS) ×2 IMPLANT
DRAPE INCISE IOBAN 66X45 STRL (DRAPES) ×1 IMPLANT
DRAPE ORTHO SPLIT 77X108 STRL (DRAPES) ×4
DRAPE SURG ORHT 6 SPLT 77X108 (DRAPES) ×2 IMPLANT
DRSG MEPILEX BORDER 4X12 (GAUZE/BANDAGES/DRESSINGS) ×2 IMPLANT
DURAPREP 26ML APPLICATOR (WOUND CARE) ×2 IMPLANT
ELECT BLADE 6.5 EXT (BLADE) ×1 IMPLANT
ELECT REM PT RETURN 9FT ADLT (ELECTROSURGICAL) ×2
ELECTRODE REM PT RTRN 9FT ADLT (ELECTROSURGICAL) ×1 IMPLANT
EVACUATOR 1/8 PVC DRAIN (DRAIN) IMPLANT
FACESHIELD LNG OPTICON STERILE (SAFETY) ×5 IMPLANT
GLOVE BIOGEL PI IND STRL 6.5 (GLOVE) IMPLANT
GLOVE BIOGEL PI IND STRL 8 (GLOVE) ×2 IMPLANT
GLOVE BIOGEL PI IND STRL 8.5 (GLOVE) ×1 IMPLANT
GLOVE BIOGEL PI INDICATOR 6.5 (GLOVE) ×1
GLOVE BIOGEL PI INDICATOR 8 (GLOVE) ×2
GLOVE BIOGEL PI INDICATOR 8.5 (GLOVE) ×1
GLOVE ECLIPSE 6.5 STRL STRAW (GLOVE) ×1 IMPLANT
GLOVE ECLIPSE 8.0 STRL XLNG CF (GLOVE) ×2 IMPLANT
GLOVE SURG ORTHO 8.5 STRL (GLOVE) ×4 IMPLANT
GOWN PREVENTION PLUS XXLARGE (GOWN DISPOSABLE) ×3 IMPLANT
GOWN STRL NON-REIN LRG LVL3 (GOWN DISPOSABLE) ×8 IMPLANT
HANDPIECE INTERPULSE COAX TIP (DISPOSABLE)
IMMOBILIZER KNEE 20 (SOFTGOODS)
IMMOBILIZER KNEE 20 THIGH 36 (SOFTGOODS) IMPLANT
IMMOBILIZER KNEE 22 UNIV (SOFTGOODS) ×1 IMPLANT
IMMOBILIZER KNEE 24 THIGH 36 (MISCELLANEOUS) IMPLANT
IMMOBILIZER KNEE 24 UNIV (MISCELLANEOUS)
KIT BASIN OR (CUSTOM PROCEDURE TRAY) ×2 IMPLANT
KIT ROOM TURNOVER OR (KITS) ×2 IMPLANT
MANIFOLD NEPTUNE II (INSTRUMENTS) ×3 IMPLANT
NEEDLE 22X1 1/2 (OR ONLY) (NEEDLE) ×2 IMPLANT
NS IRRIG 1000ML POUR BTL (IV SOLUTION) ×3 IMPLANT
PACK TOTAL JOINT (CUSTOM PROCEDURE TRAY) ×2 IMPLANT
PAD ARMBOARD 7.5X6 YLW CONV (MISCELLANEOUS) ×4 IMPLANT
PRESSURIZER FEMORAL UNIV (MISCELLANEOUS) IMPLANT
SET HNDPC FAN SPRY TIP SCT (DISPOSABLE) IMPLANT
STAPLER VISISTAT 35W (STAPLE) ×2 IMPLANT
SUCTION FRAZIER TIP 10 FR DISP (SUCTIONS) ×2 IMPLANT
SUT BONE WAX W31G (SUTURE) IMPLANT
SUT ETHIBOND NAB CT1 #1 30IN (SUTURE) ×7 IMPLANT
SUT MNCRL AB 3-0 PS2 18 (SUTURE) IMPLANT
SUT VIC AB 0 CT1 27 (SUTURE) ×6
SUT VIC AB 0 CT1 27XBRD ANBCTR (SUTURE) ×3 IMPLANT
SUT VIC AB 1 CT1 27 (SUTURE) ×4
SUT VIC AB 1 CT1 27XBRD ANBCTR (SUTURE) ×2 IMPLANT
SUT VIC AB 2-0 CT1 27 (SUTURE) ×2
SUT VIC AB 2-0 CT1 TAPERPNT 27 (SUTURE) ×1 IMPLANT
SYR CONTROL 10ML LL (SYRINGE) ×2 IMPLANT
TOWEL OR 17X24 6PK STRL BLUE (TOWEL DISPOSABLE) ×2 IMPLANT
TOWEL OR 17X26 10 PK STRL BLUE (TOWEL DISPOSABLE) ×2 IMPLANT
TOWER CARTRIDGE SMART MIX (DISPOSABLE) IMPLANT
TRAY FOLEY CATH 14FR (SET/KITS/TRAYS/PACK) ×2 IMPLANT
WATER STERILE IRR 1000ML POUR (IV SOLUTION) ×8 IMPLANT

## 2013-01-11 NOTE — Plan of Care (Signed)
Problem: Consults Goal: Diagnosis- Total Joint Replacement Primary Total Hip RIght     

## 2013-01-11 NOTE — Op Note (Signed)
PATIENT ID:      [name]  MRN:     [id number] DOB/AGE:    Dec 02, 1965 / 47 y.o.       OPERATIVE REPORT    DATE OF PROCEDURE:  01/11/2013       PREOPERATIVE DIAGNOSIS:   Avascular Necrosis Right Hip                                                       Estimated body mass index is 32.14 kg/(m^2) as calculated from the following:   Height as of 01/09/13: 6' (1.829 m).   Weight as of 11/20/12: 107.502 kg (237 lb).     POSTOPERATIVE DIAGNOSIS:   Avascular Necrosis Right Hip                                                                     Estimated body mass index is 32.14 kg/(m^2) as calculated from the following:   Height as of 01/09/13: 6' (1.829 m).   Weight as of 11/20/12: 107.502 kg (237 lb).     PROCEDURE:  Procedure(s): TOTAL HIP ARTHROPLASTY right     SURGEON:  Norlene Campbell, MD    ASSISTANT:   Jacqualine Code, PA-C   (Present and scrubbed throughout the case, critical for assistance with exposure, retraction, instrumentation, and closure.)          ANESTHESIA: general     DRAINS: none :      TOURNIQUET TIME: * No tourniquets in log *    COMPLICATIONS:  None   CONDITION:  stable  PROCEDURE IN DETAIL: [id number]  [name] 01/11/2013, 1:05 PM

## 2013-01-11 NOTE — Anesthesia Procedure Notes (Signed)
Procedure Name: Intubation Date/Time: 01/11/2013 11:10 AM Performed by: Charm Barges, [place] R Pre-anesthesia Checklist: Patient identified, Emergency Drugs available, Suction available, Patient being monitored and Timeout performed Patient Re-evaluated:Patient Re-evaluated prior to inductionOxygen Delivery Method: Circle system utilized Preoxygenation: Pre-oxygenation with 100% oxygen Intubation Type: IV induction Ventilation: Mask ventilation without difficulty and Oral airway inserted - appropriate to patient size Laryngoscope Size: Mac and 4 Grade View: Grade I Tube type: Oral Tube size: 8.0 mm Airway Equipment and Method: Stylet Placement Confirmation: ETT inserted through vocal cords under direct vision,  positive ETCO2 and breath sounds checked- equal and bilateral Secured at: 24 cm Tube secured with: Tape Dental Injury: Teeth and Oropharynx as per pre-operative assessment

## 2013-01-11 NOTE — H&P (Signed)
  The recent History & Physical has been reviewed. I have personally examined the patient today. There is no interval change to the documented History & Physical. The patient would like to proceed with the procedure.  Norlene Campbell W 01/11/2013,  10:27 AM

## 2013-01-11 NOTE — Anesthesia Preprocedure Evaluation (Addendum)
Anesthesia Evaluation  Patient identified by MRN, date of birth, ID band Patient awake    Reviewed: Allergy & Precautions, H&P , NPO status , Patient's Chart, lab work & pertinent test results  History of Anesthesia Complications Negative for: history of anesthetic complications  Airway Mallampati: II TM Distance: >3 FB Neck ROM: Full    Dental  (+) Teeth Intact and Dental Advisory Given   Pulmonary former smoker (quit '01),  breath sounds clear to auscultation  Pulmonary exam normal       Cardiovascular Rhythm:Regular Rate:Normal  Borderline HTN: no meds   Neuro/Psych Anxiety negative neurological ROS     GI/Hepatic negative GI ROS, Neg liver ROS,   Endo/Other  Morbid obesity  Renal/GU negative Renal ROS     Musculoskeletal  (+) Arthritis - (narcotics daily),   Abdominal (+) + obese,   Peds  Hematology negative hematology ROS (+)   Anesthesia Other Findings   Reproductive/Obstetrics                          Anesthesia Physical Anesthesia Plan  ASA: II  Anesthesia Plan: General   Post-op Pain Management:    Induction: Intravenous  Airway Management Planned: Oral ETT  Additional Equipment:   Intra-op Plan:   Post-operative Plan: Extubation in OR  Informed Consent: I have reviewed the patients History and Physical, chart, labs and discussed the procedure including the risks, benefits and alternatives for the proposed anesthesia with the patient or authorized representative who has indicated his/her understanding and acceptance.   Dental advisory given  Plan Discussed with: CRNA and Surgeon  Anesthesia Plan Comments: (Plan routine monitors, GETA )        Anesthesia Quick Evaluation

## 2013-01-11 NOTE — Anesthesia Postprocedure Evaluation (Signed)
  Anesthesia Post-op Note  Patient: [name]  Procedure(s) Performed: Procedure(s): TOTAL HIP ARTHROPLASTY (Right)  Patient Location: PACU  Anesthesia Type:General  Level of Consciousness: awake, alert , oriented and patient cooperative  Airway and Oxygen Therapy: Patient Spontanous Breathing and Patient connected to nasal cannula oxygen  Post-op Pain: mild  Post-op Assessment: Post-op Vital signs reviewed, Patient's Cardiovascular Status Stable, Respiratory Function Stable, Patent Airway, No signs of Nausea or vomiting and Pain level controlled  Post-op Vital Signs: Reviewed and stable  Complications: No apparent anesthesia complications

## 2013-01-11 NOTE — Transfer of Care (Signed)
Immediate Anesthesia Transfer of Care Note  Patient: [name]  Procedure(s) Performed: Procedure(s): TOTAL HIP ARTHROPLASTY (Right)  Patient Location: PACU  Anesthesia Type:General  Level of Consciousness: awake, alert  and oriented  Airway & Oxygen Therapy: Patient Spontanous Breathing and Patient connected to nasal cannula oxygen  Post-op Assessment: Report given to PACU RN, Post -op Vital signs reviewed and stable and Patient moving all extremities  Post vital signs: Reviewed and stable  Complications: No apparent anesthesia complications

## 2013-01-11 NOTE — Preoperative (Signed)
Beta Blockers   Reason not to administer Beta Blockers:Not Applicable 

## 2013-01-11 NOTE — Progress Notes (Signed)
Spoke with Dr. Jean Rosenthal regarding [name] Pain.  He is asleep, but when awakened, states pain is 10/10.  Ordered to give 10 oxy/IR

## 2013-01-11 NOTE — Op Note (Signed)
[name], [name] NO.:  [id number]  MEDICAL RECORD NO.:  [id number]  LOCATION:  5N01C                        FACILITY:  [facility]  PHYSICIAN:  Claude Manges. Deshondra Worst, M.D.DATE OF BIRTH:  02/22/1966  DATE OF PROCEDURE:  01/11/2013 DATE OF DISCHARGE:                              OPERATIVE REPORT   PREOPERATIVE DIAGNOSIS:  Avascular necrosis, right femoral head with osteoarthritis right hip.  POSTOPERATIVE DIAGNOSIS:  Avascular necrosis, right femoral head with osteoarthritis right hip, with obesity, BMI 32.  PROCEDURE:  Right total hip replacement.  SURGEON:  Claude Manges. Cleophas Dunker, M.D.  ASSISTANT:  Arlys John D. Petrarca, P.A.-C.  ANESTHESIA:  General.  COMPLICATIONS:  None.  COMPONENTS:  DePuy AML large stature 15 mm femoral component, a 36 mm outer diameter ceramic hip ball with a 1.5 mm neck length.  A Sector 3 Gription metallic acetabular component with an apex hole eliminator, and a Marathon polyethylene liner with a 10 degree posterior lip. Components were press-fit.  DESCRIPTION OF PROCEDURE:  [name] was met in the holding area, identified the right hip as the appropriate operative site.  Leg lengths appeared to be symmetrical.  The patient was then transported to room 3, placed under general anesthesia on the operating table without difficulty.  Nursing staff inserted a Foley catheter, urine was clear.  Time-out was called.  The patient was then placed in the lateral decubitus position with the right side up and secured to the operating room table with the Innomed hip system.  The right lower extremity was then prepped with chlorhexidine scrub and DuraPrep from iliac crest to the midcalf, sterile draping was performed. A routine Southern incision was utilized and via sharp dissection carried down through subcutaneous tissue.  Gross bleeders were Bovie coagulated.  Adipose tissue was incised to the level of the iliotibial band.  The IT band was  then incised with the Bovie and self-retaining retractors were inserted.  With the hip internally rotated, the short external rotators were identified.  Tendinous structures were tagged with 0 Ethibond suture.  These were then released to view the capsule. The capsule was incised along the femoral neck and head.  The joint was entered.  There was about an 8-10 mL clear yellow joint effusion.  The head was then dislocated posteriorly without difficulty.  There were large areas of articular cartilage loss.  There was ping pong effect in the ball on the femoral head consistent with avascular necrosis.  Using the calcar guide and osteotomy was then made just below the femoral head on the femoral neck, head was removed.  The wound was then irrigated with saline solution.  The soft tissue was removed from the piriformis fossa.  A center hole was then made followed by the canal finder.  Reaming was performed sequentially to 14.5, to accept a 15-mm component.  I had excellent endosteal purchase.  Rasping was performed sequentially to a large stature 15 mm femoral component.  It fit nice and snugly without any toggling and I obtained appropriate calcar angle using the calcar reamer.  Retractors were then placed about the acetabulum.  There was abundant amount of beefy-red synovitis that was removed.  Reaming  was performed sequentially to 53 mm to accept a 54 mm component and I had nice deep acetabulum with adequate bone and bleeding bone circumferentially.  I trialed a 52 component, which would completely seat with a good rim fit.  The 54 component would not completely seat.  I elected to use the Gription Sector 3 acetabular component, this was impacted in place, and what I thought was appropriate abduction and flexion.  The trial polyethylene bearing was then screwed in place.  The 15-mm large stature femoral rasp was then impacted on the calcar. We trialed a 1.5-mm neck length with the 36  mm outer diameter hip ball correspond to the inner diameter of the acetabulum polyethylene.  The entire construct was reduced with excellent range of motion.  There was absolutely no instability or toggling and leg lengths appeared to be symmetrical.  The trial components were then removed.  The joint was again copiously irrigated with saline solution.  Retractors were carefully placed around the acetabulum.  I inserted the apex hole eliminator.  I did not use any screws, as I felt the acetabulum was really stable.  The wound was again irrigated with saline solution.  The large stature of 5H powder coated femoral component was then impacted into the femoral canal, nicely on the calcar.  Cleaned the [facility] taper neck.  I elected to use a ceramic head  because of the patient's age.  This was impacted in place.  We inspected the acetabulum was cleaned and the entire construct was reduced.  Again, through a full range of motion the hip was perfectly stable in flexion extension, internal-external rotation, I thought leg lengths were symmetrical.  The wound was again irrigated with saline solution.  The capsule was then closed anatomically with #1 Ethibond.  The short external rotator tendons, closed with similar material.  The wound was again irrigated with saline solution.  The iliotibial band was then closed with a running #1 Vicryl, subcutaneous closed in several layers with 2-0 Vicryl and 3-0 Monocryl.  Skin closed with skin clips.  Sterile bulky dressing was applied.  The patient was then carefully placed in the supine position and knee immobilizer applied to the right lower extremity.  The patient was then awoken, placed on the operating room bed and transferred to the recovery room without problem.  The patient tolerated the procedure well without complications.     Claude Manges. Cleophas Dunker, M.D.     PWW/MEDQ  D:  01/11/2013  T:  01/11/2013  Job:  [id number]

## 2013-01-12 ENCOUNTER — Encounter (HOSPITAL_COMMUNITY): Payer: Self-pay | Admitting: Orthopaedic Surgery

## 2013-01-12 LAB — BASIC METABOLIC PANEL
Calcium: 8.3 mg/dL — ABNORMAL LOW (ref 8.4–10.5)
GFR calc Af Amer: 83 mL/min — ABNORMAL LOW (ref >90)
GFR calc non Af Amer: 71 mL/min — ABNORMAL LOW (ref >90)
Sodium: 136 mEq/L (ref 135–145)

## 2013-01-12 LAB — CBC
MCH: 33.2 pg (ref 26.0–34.0)
MCHC: 34.5 g/dL (ref 30.0–36.0)
Platelets: 158 10*3/uL (ref 150–400)
RBC: 3.46 MIL/uL — ABNORMAL LOW (ref 4.22–5.81)
RDW: 12.5 % (ref 11.5–15.5)

## 2013-01-12 MED ORDER — METHOCARBAMOL 500 MG PO TABS: 500.0000 mg | ORAL_TABLET | Freq: Four times a day (QID) | ORAL | Status: DC | PRN

## 2013-01-12 MED ORDER — RIVAROXABAN 10 MG PO TABS: 10.0000 mg | ORAL_TABLET | ORAL | Status: DC

## 2013-01-12 MED ORDER — OXYCODONE HCL 5 MG PO TABS: 5.0000 mg | ORAL_TABLET | ORAL | Status: DC | PRN

## 2013-01-12 NOTE — Evaluation (Addendum)
Occupational Therapy Evaluation Patient Details Name: [name] MRN: [id number] DOB: 02/23/1966 Today's Date: 01/12/2013 Time: 7829-5621 OT Time Calculation (min): 12 min  OT Assessment / Plan / Recommendation Clinical Impression    This pt. underwent a right THA and presents with below problem list. Pt. Will benefit from acute OT to increase independence prior to d/c. Pt. Appears anxious and irritable and was not very open to learning his posterior hip precautions.     OT Assessment  Patient needs continued OT Services    Follow Up Recommendations  Home health OT;Supervision/Assistance - 24 hour    Barriers to Discharge      Equipment Recommendations  3 in 1 bedside comode    Recommendations for Other Services    Frequency  Min 2X/week    Precautions / Restrictions Precautions Precautions: Posterior Hip;Fall Precaution Booklet Issued: Yes (comment) (given by PT) Precaution Comments: posterior total hip precaution sheet issued and pt. and wife were educated by PT.  Pt. did not appear to take the material seriously, however his wife was concerned and made sure she had a clear understanding. Required Braces or Orthoses: Knee Immobilizer - Right Restrictions Weight Bearing Restrictions: Yes RLE Weight Bearing: Weight bearing as tolerated Other Position/Activity Restrictions: posterior total hip precautions   Pertinent Vitals/Pain 10/10 pain in hip. Nurse notified for pain meds.     ADL  Eating/Feeding: Independent Where Assessed - Eating/Feeding: Bed level Grooming: Set up;Supervision/safety Where Assessed - Grooming: Unsupported sitting Upper Body Bathing: Set up;Supervision/safety Where Assessed - Upper Body Bathing: Unsupported sitting Lower Body Bathing: Maximal assistance Where Assessed - Lower Body Bathing: Supported sit to stand Upper Body Dressing: Set up;Supervision/safety Where Assessed - Upper Body Dressing: Unsupported sitting Lower Body Dressing: +1 Total  assistance Where Assessed - Lower Body Dressing: Supported sit to stand Toilet Transfer: Minimal assistance Toilet Transfer Method: Sit to Barista: Other (comment) (from bed) Tub/Shower Transfer Method: Not assessed Equipment Used: Gait belt;Knee Immobilizer;Rolling walker ADL Comments: Pt at Max A/Total A for LB ADLs.   OT did talk briefly about AE and dressing technique.    OT Diagnosis: Acute pain  OT Problem List: Decreased strength;Decreased range of motion;Decreased activity tolerance;Impaired balance (sitting and/or standing);Decreased safety awareness;Decreased knowledge of use of DME or AE;Decreased knowledge of precautions;Pain OT Treatment Interventions: Self-care/ADL training;DME and/or AE instruction;Therapeutic activities;Patient/family education;Balance training   OT Goals Acute Rehab OT Goals OT Goal Formulation: With patient/family Time For Goal Achievement: 01/19/13 Potential to Achieve Goals: Good ADL Goals Pt Will Perform Lower Body Bathing: with modified independence;Sit to stand from chair;with adaptive equipment ADL Goal: Lower Body Bathing - Progress: Goal set today Pt Will Perform Lower Body Dressing: with modified independence;Sit to stand from bed;Sit to stand from chair;with adaptive equipment ADL Goal: Lower Body Dressing - Progress: Goal set today Pt Will Transfer to Toilet: with modified independence;Ambulation;with DME ADL Goal: Toilet Transfer - Progress: Goal set today Pt Will Perform Toileting - Clothing Manipulation: with modified independence;Standing ADL Goal: Toileting - Clothing Manipulation - Progress: Goal set today Pt Will Perform Toileting - Hygiene: with modified independence;Sit to stand from 3-in-1/toilet;Sitting on 3-in-1 or toilet ADL Goal: Toileting - Hygiene - Progress: Goal set today Pt Will Perform Tub/Shower Transfer: Tub transfer;with supervision;Ambulation;with DME ADL Goal: Tub/Shower Transfer - Progress:  Goal set today Miscellaneous OT Goals Miscellaneous OT Goal #1: Pt will verbalize and demonstrate 3/3 hip precautions independently. OT Goal: Miscellaneous Goal #1 - Progress: Goal set today  Visit Information  Last OT Received On: 01/12/13 Assistance Needed: +1    Subjective Data      Prior Functioning     Home Living Lives With: Spouse;Daughter Available Help at Discharge: Family;Available 24 hours/day Type of Home: House Home Access: Stairs to enter [employer] of Steps: 3 Entrance Stairs-Rails: None Home Layout: One level Bathroom Shower/Tub: Tub/shower unit Home Adaptive Equipment: None;Other (comment) (pt. says he may have access to RW) Additional Comments: wife wil check on possible availability of RW Prior Function Level of Independence: Independent Able to Take Stairs?: Yes Driving: Yes Vocation: Unemployed Communication Communication: No difficulties         Vision/Perception     Copywriter, advertising Arousal/Alertness: Awake/alert Behavior During Therapy: Agitated Overall Cognitive Status: Within Functional Limits for tasks assessed    Extremity/Trunk Assessment Right Upper Extremity Assessment RUE ROM/Strength/Tone: Within functional levels Left Upper Extremity Assessment LUE ROM/Strength/Tone: Within functional levels     Mobility Bed Mobility Bed Mobility: Supine to Sit;Sitting - Scoot to Edge of Bed;Sit to Supine Supine to Sit: 4: Min assist Sitting - Scoot to Edge of Bed: 4: Min assist Sit to Supine: 4: Min assist Details for Bed Mobility Assistance: Min A to manage R LE.  Cues to maintain precautions.  Transfers Transfers: Sit to Stand;Stand to Sit Sit to Stand: 4: Min assist;With upper extremity assist;From bed Stand to Sit: 4: Min assist;With upper extremity assist;To bed Details for Transfer Assistance: cues for technique and hand placement        Balance     End of Session OT - End of Session Equipment Utilized  During Treatment: Gait belt;Right knee immobilizer Activity Tolerance: Patient limited by pain;Patient limited by fatigue Patient left: in bed;with family/visitor present Nurse Communication: Patient requests pain meds  GO     Earlie Raveling OTR/L [phone] 01/12/2013, 1:43 PM

## 2013-01-12 NOTE — Progress Notes (Signed)
Patient ID: [name], male   DOB: Feb 19, 1966, 47 y.o.   MRN: [id number] PATIENT ID: [name]        MRN:  [id number]          DOB/AGE: [birth year] / 47 y.o.    Norlene Campbell, MD   Jacqualine Code, PA-C [address] [place], [place]  [address]                             [phone]   PROGRESS NOTE  Subjective:  negative for Chest Pain  negative for Shortness of Breath  negative for Nausea/Vomiting   negative for Calf Pain    Tolerating Diet: yes         Patient reports pain as moderate.     Difficult night with pain, VS stable, awake and alert  Objective: Vital signs in last 24 hours:   Patient Vitals for the past 24 hrs:  BP Temp Temp src Pulse Resp SpO2  01/12/13 0648 122/77 mmHg 98.9 F (37.2 C) - 71 18 96 %  01/12/13 0455 - - - - 18 93 %  01/12/13 0100 150/84 mmHg 99.5 F (37.5 C) Oral 96 20 96 %  01/12/13 0030 - - - - 18 96 %  01/11/13 2003 145/63 mmHg 99.3 F (37.4 C) Oral 98 18 98 %  01/11/13 1730 112/61 mmHg 99.3 F (37.4 C) - 84 18 98 %  01/11/13 1715 115/72 mmHg 99 F (37.2 C) - 79 12 90 %  01/11/13 1700 102/89 mmHg - - 82 15 95 %  01/11/13 1645 127/68 mmHg - - 68 11 98 %  01/11/13 1630 - - - 97 15 100 %  01/11/13 1615 126/74 mmHg - - 64 11 100 %  01/11/13 1600 113/60 mmHg - - 70 10 91 %  01/11/13 1545 130/81 mmHg - - 85 11 100 %  01/11/13 1530 117/72 mmHg - - 73 12 98 %  01/11/13 1515 123/72 mmHg - - 71 12 100 %  01/11/13 1500 146/67 mmHg - - 64 9 100 %  01/11/13 1445 124/64 mmHg - - 74 11 99 %  01/11/13 1430 124/69 mmHg - - 62 9 98 %  01/11/13 1415 142/72 mmHg - - 69 10 100 %  01/11/13 1400 141/65 mmHg - - 72 10 100 %  01/11/13 1345 133/66 mmHg - - 74 7 100 %  01/11/13 1330 154/87 mmHg 99.6 F (37.6 C) - 86 9 100 %  01/11/13 0926 133/84 mmHg 98.9 F (37.2 C) Oral 65 16 97 %      Intake/Output from previous day:   06/19 0701 - 06/20 0700 In: 3267.8 [P.O.:444; I.V.:2823.8] Out: 2950 [Urine:2700]   Intake/Output this shift:       Intake/Output     06/19 0701 - 06/20 0700 06/20 0701 - 06/21 0700   P.O. 444    I.V. 2823.8    Total Intake 3267.8     Urine 2700    Blood 250    Total Output 2950     Net +317.8             LABORATORY DATA:  Recent Labs  01/09/13 1047 01/12/13 0540  WBC 6.3 8.4  HGB 15.6 11.5*  HCT 42.7 33.3*  PLT 205 158    Recent Labs  01/09/13 1047  NA 139  K 4.2  CL 104  CO2 24  BUN 11  CREATININE 0.93  GLUCOSE 103*  CALCIUM 9.0   Lab Results  Component Value Date   INR 0.99 01/09/2013    Recent Radiographic Studies :  Chest 2 View  01/09/2013   *RADIOLOGY REPORT*  Clinical Data: Preoperative evaluation for hip replacement  CHEST - 2 VIEW  Comparison: 07/21/2009  Findings: The heart and pulmonary vascularity are within normal limits.  The lungs are well-aerated bilaterally without focal infiltrate.  No acute bony abnormality is seen.  IMPRESSION: No acute abnormality noted.   Original Report Authenticated By: Alcide Clever, M.D.   Dg Pelvis Portable  01/11/2013   *RADIOLOGY REPORT*  Clinical Data: Postop right hip  PORTABLE PELVIS  Comparison: None.  Findings: Right total hip arthroplasty in satisfactory position on this single frontal view.  Overlying skin staples.  Mild degenerative changes of the left hip.  IMPRESSION: Right total hip arthroplasty in satisfactory position.   Original Report Authenticated By: Charline Bills, M.D.   Dg Hip Portable 1 View Right  01/11/2013   *RADIOLOGY REPORT*  Clinical Data:  Post hip replacement  PORTABLE RIGHT HIP - 1 VIEW  Comparison: Portable cross-table lateral view 1350 hours compared to 05/13/2013 pelvic radiograph  Findings: Femoral component of a right hip prosthesis is identified. No fracture, dislocation or bone destruction.  IMPRESSION: Right hip prosthesis without acute complication.   Original Report Authenticated By: Ulyses Southward, M.D.     Examination:  General appearance: alert, cooperative and no distress  Wound Exam:  clean, dry, intact   Drainage:  None: wound tissue dry  Motor Exam: EHL, FHL, Anterior Tibial and Posterior Tibial Intact  Sensory Exam: Superficial Peroneal, Deep Peroneal and Tibial normal  Vascular Exam: Normal  Assessment:    1 Day Post-Op  Procedure(s) (LRB): TOTAL HIP ARTHROPLASTY (Right)  ADDITIONAL DIAGNOSIS:  Active Problems:   * No active hospital problems. *  Acute Blood Loss Anemia-asymptomatic   Plan: Physical Therapy as ordered Weight Bearing as Tolerated (WBAT)  DVT Prophylaxis:  Xarelto  DISCHARGE PLAN: Home  DISCHARGE NEEDS: HHPT, Walker and 3-in-1 comode seat   films with excellent position of components, OOB with PT, hope to D/C in am, watch for DT'S      Valeria Batman 01/12/2013 7:03 AM

## 2013-01-12 NOTE — Discharge Summary (Signed)
[name] Campbell, MD   [name] Code, PA-C [address] [place], [place]  [address]                             [phone]  PATIENT ID: [name]        MRN:  [id number]          DOB/AGE: 48/12/1965 / 47 y.o.    DISCHARGE SUMMARY  ADMISSION DATE:    01/11/2013 DISCHARGE DATE:   01/12/2013   ADMISSION DIAGNOSIS: Avascular Necrosis Right Hip    DISCHARGE DIAGNOSIS:  Avascular Necrosis Right Hip    ADDITIONAL DIAGNOSIS: Active Problems:   * No active hospital problems. *  Past Medical History  Diagnosis Date  . Hip joint pain     right    PROCEDURE: Procedure(s): TOTAL HIP ARTHROPLASTY Righton 01/11/2013  CONSULTS: none     HISTORY: [name] is a pleasant 48 year old male who is seen for evaluation of his hip and knee pain on the right. He has been noticing periods of pain especially when he is walking, which is in the anterior portion of his thigh. He has had x-rays originally of his knee, which really did not show much, but had an x-ray performed of his right hip, which revealed some changes. An MRI was ordered. This revealed AVN of both hips, right worse than left. He has been taking ibuprofen and some Tylenol at times but this only minimally helped for a while. His pain sometimes so bad that he just can only essentially stay in bed or sitting. He has had no history of injury or trauma. He has pain with every step. Pain is moderately severe, more of a throbbing, aching pain with occasional sharpness at times. Nothing seems to be really helping him at this time. Ambulation, of course, worsens his symptoms.   HOSPITAL COURSE:  [name] is a 47 y.o. admitted on 01/11/2013 and found to have a diagnosis of Avascular Necrosis Right Hip.  After appropriate laboratory studies were obtained  they were taken to the operating room on 01/11/2013 and underwent  Procedure(s): TOTAL HIP ARTHROPLASTY Right.   They were given perioperative antibiotics:  Anti-infectives   Start      Dose/Rate Route Frequency Ordered Stop   01/11/13 1800  ceFAZolin (ANCEF) IVPB 2 g/50 mL premix     2 g 100 mL/hr over 30 Minutes Intravenous Every 6 hours 01/11/13 1737 01/12/13 0130   01/11/13 0600  ceFAZolin (ANCEF) 3 g in dextrose 5 % 50 mL IVPB     3 g 160 mL/hr over 30 Minutes Intravenous On call to O.R. 01/10/13 1411 01/11/13 1115    .  Tolerated the procedure well.  Placed with a foley intraoperatively.  Toradol was given post op.  POD #1, allowed out of bed to a chair.  PT for ambulation and exercise program.  Foley D/C'd in morning.  IV saline locked.  O2 discontionued.  POD #2, continued PT and ambulation.  Marland Kitchen Postoperative day 3 patient was stable and comfortable. The remainder of the hospital course was dedicated to ambulation and strengthening.   The patient was discharged on 1 Day Post-Op in  Stable condition.  Blood products given:none  DIAGNOSTIC STUDIES: Recent vital signs: Patient Vitals for the past 24 hrs:  BP Temp Temp src Pulse Resp SpO2  01/12/13 0648 122/77 mmHg 98.9 F (37.2 C) - 71 18 96 %  01/12/13 0455 - - - -  18 93 %  01/12/13 0100 150/84 mmHg 99.5 F (37.5 C) Oral 96 20 96 %  01/12/13 0030 - - - - 18 96 %  01/11/13 2003 145/63 mmHg 99.3 F (37.4 C) Oral 98 18 98 %  01/11/13 1730 112/61 mmHg 99.3 F (37.4 C) - 84 18 98 %  01/11/13 1715 115/72 mmHg 99 F (37.2 C) - 79 12 90 %  01/11/13 1700 102/89 mmHg - - 82 15 95 %  01/11/13 1645 127/68 mmHg - - 68 11 98 %  01/11/13 1630 - - - 97 15 100 %  01/11/13 1615 126/74 mmHg - - 64 11 100 %  01/11/13 1600 113/60 mmHg - - 70 10 91 %  01/11/13 1545 130/81 mmHg - - 85 11 100 %  01/11/13 1530 117/72 mmHg - - 73 12 98 %  01/11/13 1515 123/72 mmHg - - 71 12 100 %  01/11/13 1500 146/67 mmHg - - 64 9 100 %  01/11/13 1445 124/64 mmHg - - 74 11 99 %  01/11/13 1430 124/69 mmHg - - 62 9 98 %  01/11/13 1415 142/72 mmHg - - 69 10 100 %  01/11/13 1400 141/65 mmHg - - 72 10 100 %  01/11/13 1345 133/66 mmHg - - 74  7 100 %  01/11/13 1330 154/87 mmHg 99.6 F (37.6 C) - 86 9 100 %  01/11/13 0926 133/84 mmHg 98.9 F (37.2 C) Oral 65 16 97 %       Recent laboratory studies:  Recent Labs  01/09/13 1047 01/12/13 0540  WBC 6.3 8.4  HGB 15.6 11.5*  HCT 42.7 33.3*  PLT 205 158    Recent Labs  01/09/13 1047 01/12/13 0540  NA 139 136  K 4.2 4.3  CL 104 101  CO2 24 29  BUN 11 15  CREATININE 0.93 1.19  GLUCOSE 103* 113*  CALCIUM 9.0 8.3*   Lab Results  Component Value Date   INR 0.99 01/09/2013     Recent Radiographic Studies :  Chest 2 View  01/09/2013   *RADIOLOGY REPORT*  Clinical Data: Preoperative evaluation for hip replacement  CHEST - 2 VIEW  Comparison: 07/21/2009  Findings: The heart and pulmonary vascularity are within normal limits.  The lungs are well-aerated bilaterally without focal infiltrate.  No acute bony abnormality is seen.  IMPRESSION: No acute abnormality noted.   Original Report Authenticated By: Alcide Clever, M.D.   Dg Pelvis Portable  01/11/2013   *RADIOLOGY REPORT*  Clinical Data: Postop right hip  PORTABLE PELVIS  Comparison: None.  Findings: Right total hip arthroplasty in satisfactory position on this single frontal view.  Overlying skin staples.  Mild degenerative changes of the left hip.  IMPRESSION: Right total hip arthroplasty in satisfactory position.   Original Report Authenticated By: Charline Bills, M.D.   Dg Hip Portable 1 View Right  01/11/2013   *RADIOLOGY REPORT*  Clinical Data:  Post hip replacement  PORTABLE RIGHT HIP - 1 VIEW  Comparison: Portable cross-table lateral view 1350 hours compared to 05/13/2013 pelvic radiograph  Findings: Femoral component of a right hip prosthesis is identified. No fracture, dislocation or bone destruction.  IMPRESSION: Right hip prosthesis without acute complication.   Original Report Authenticated By: Ulyses Southward, M.D.    DISCHARGE INSTRUCTIONS: Discharge Orders   Future Orders Complete By Expires     Call MD / Call  911  As directed     Comments:      If you experience chest pain or  shortness of breath, CALL 911 and be transported to the hospital emergency room.  If you develope a fever above 101 F, pus (white drainage) or increased drainage or redness at the wound, or calf pain, call your surgeon's office.    Change dressing  As directed     Comments:      You may change your dressing on MONDAY, then change the dressing daily with sterile 4 x 4 inch gauze dressing and paper tape.  You may clean the incision with alcohol prior to redressing    Constipation Prevention  As directed     Comments:      Drink plenty of fluids.  Prune juice and/or coffee may be helpful.  You may use a stool softener, such as Colace (over the counter) 100 mg twice a day.  Use MiraLax (over the counter) for constipation as needed but this may take several days to work.  Mag Citrate --OR-- Milk of Magnesia may also be used but follow directions on the label.    Diet general  As directed     Driving restrictions  As directed     Comments:      No driving for 6 weeks    Follow the hip precautions as taught in Physical Therapy  As directed     Increase activity slowly as tolerated  As directed     Lifting restrictions  As directed     Comments:      No lifting for 6 weeks    Patient may shower  As directed     Comments:      You may shower over the brown dressing.  Once the dressing is removed you may shower without a dressing once there is no drainage.  Do not wash over the wound.  If drainage remains, cover wound with plastic wrap and then shower.    TED hose  As directed     Comments:      Use stockings (TED hose) for 1-2 weeks on operative leg(s).  You may remove them at night for sleeping. May stop the NON-operative leg stocking when you go home.    Weight bearing as tolerated  As directed        DISCHARGE MEDICATIONS:     Medication List    STOP taking these medications       traMADol 50 MG tablet  Commonly known  as:  ULTRAM      TAKE these medications       methocarbamol 500 MG tablet  Commonly known as:  ROBAXIN  Take 1 tablet (500 mg total) by mouth every 6 (six) hours as needed.     oxyCODONE 5 MG immediate release tablet  Commonly known as:  Oxy IR/ROXICODONE  Take 1-2 tablets (5-10 mg total) by mouth every 4 (four) hours as needed.     rivaroxaban 10 MG Tabs tablet  Commonly known as:  XARELTO  Take 1 tablet (10 mg total) by mouth daily.        FOLLOW UP VISIT:       Follow-up Information   Follow up with PETRARCA,BRIAN, PA-C. Schedule an appointment as soon as possible for a visit on 01/24/2013.   Contact information:   [address] [place] [address] [phone]       DISPOSITION:   Home  CONDITION:  Stable   PETRARCA,BRIAN 01/12/2013, 7:54 AM

## 2013-01-12 NOTE — Progress Notes (Signed)
UR COMPLETED  

## 2013-01-12 NOTE — Consult Note (Signed)
Spoke to patient concerning DME needs and patient and wife advised me that he already has walker at home and patient refused commode.

## 2013-01-12 NOTE — Care Management Note (Signed)
CARE MANAGEMENT NOTE 01/12/2013  Patient:  [name], [name]   Account Number:  [id number]  Date Initiated:  01/12/2013  Documentation initiated by:  Vance Peper  Subjective/Objective Assessment:   47yr old male s/p right total hip arthroplasty.     Action/Plan:   CM spoke with patient concerning home health and DME needs at discharge. Choice offered.Patient may not require HH, will wait for PT/OT to eval.   Anticipated DC Date:  01/13/2013   Anticipated DC Plan:        DC Planning Services  CM consult      PAC Choice  DURABLE MEDICAL EQUIPMENT   Choice offered to / List presented to:  C-1 Patient   DME arranged  3-N-1  Levan Hurst      DME agency  [facility]        [facility] agency  [facility]   Status of service:  In process, will continue to follow Medicare Important Message given?   (If response is "NO", the following Medicare IM given date fields will be blank) Date Medicare IM given:   Date Additional Medicare IM given:    Discharge Disposition:    Per UR Regulation:    If discussed at Long Length of Stay Meetings, dates discussed:    Comments:

## 2013-01-12 NOTE — Evaluation (Signed)
Physical Therapy Evaluation Patient Details Name: [name] MRN: [id number] DOB: [birth year] Today's Date: 01/12/2013 Time: 4098-1191 PT Time Calculation (min): 24 min  PT Assessment / Plan / Recommendation Clinical Impression   This pt. underwent a right THA and presents to PT with anticipated post-op decreased strength, functional mobility and gait.  Pt. Will benefit from acute PT to address these and below issues. Pt. Appears anxious and irritable and was not very open to learning his  posterior hip precautions. He appears distractable.  Wife was instructed in hip precautions and says she will encourage pt.  Anticipate he will be ready for Dc home tomorrow if he can successfully negotiate steps.            PT Assessment  Patient needs continued PT services    Follow Up Recommendations  Home health PT;Supervision/Assistance - 24 hour;Supervision for mobility/OOB    Does the patient have the potential to tolerate intense rehabilitation      Barriers to Discharge None      Equipment Recommendations  Rolling walker with 5" wheels;Other (comment) (unless pt. finds he has access to one)    Recommendations for Other Services     Frequency 7X/week    Precautions / Restrictions Precautions Precautions: Posterior Hip;Fall Precaution Booklet Issued: Yes (comment) (given by PT) Precaution Comments: posterior total hip precaution sheet issued and pt. and wife were educated.  Pt. did not appear to take the material seriously, however his wife was concerned and made sure she had a clear understanding. Required Braces or Orthoses: Knee Immobilizer - Right Restrictions Weight Bearing Restrictions: Yes RLE Weight Bearing: Weight bearing as tolerated Other Position/Activity Restrictions: posterior total hip precautions   Pertinent Vitals/Pain See vitals tab       Mobility  Bed Mobility Bed Mobility: Supine to Sit;Sitting - Scoot to Edge of Bed;Sit to Supine Supine to Sit: Not tested  (comment) Sitting - Scoot to Edge of Bed: Not tested (comment) Sit to Supine: 4: Min assist Details for Bed Mobility Assistance: min assist needed to assist R LE up into bed Transfers Transfers: Sit to Stand;Stand to Sit Sit to Stand: 4: Min assist;With upper extremity assist;From chair/3-in-1 Stand to Sit: 4: Min assist;With upper extremity assist;To bed Details for Transfer Assistance: cues for correct hand placement and safe technique Ambulation/Gait Ambulation/Gait Assistance: 4: Min assist Ambulation Distance (Feet): 50 Feet Assistive device: Rolling walker Ambulation/Gait Assistance Details: Pt. needed education on sequencing and step length while ambulation.  Needed min assist for safety and stability. Gait Pattern: Step-to pattern;Decreased step length - right;Decreased step length - left;Decreased stance time - right Gait velocity: decreased    Exercises Total Joint Exercises Ankle Circles/Pumps: AROM;Both;15 reps Quad Sets: AROM;Right;10 reps   PT Diagnosis: Difficulty walking;Abnormality of gait;Acute pain  PT Problem List: Decreased strength;Decreased activity tolerance;Decreased mobility;Decreased knowledge of use of DME;Decreased knowledge of precautions;Pain PT Treatment Interventions: DME instruction;Gait training;Stair training;Functional mobility training;Therapeutic activities;Therapeutic exercise;Patient/family education   PT Goals Acute Rehab PT Goals PT Goal Formulation: With patient Time For Goal Achievement: 01/19/13 Potential to Achieve Goals: Good Pt will go Supine/Side to Sit: with modified independence PT Goal: Supine/Side to Sit - Progress: Goal set today Pt will go Sit to Supine/Side: with modified independence PT Goal: Sit to Supine/Side - Progress: Goal set today Pt will go Sit to Stand: with modified independence PT Goal: Sit to Stand - Progress: Goal set today Pt will go Stand to Sit: with modified independence PT Goal: Stand to Sit - Progress:  Goal set today Pt will Ambulate: 51 - 150 feet;with modified independence;with rolling walker PT Goal: Ambulate - Progress: Goal set today Pt will Go Up / Down Stairs: 3-5 stairs;with min assist;with rolling walker PT Goal: Up/Down Stairs - Progress: Goal set today Pt will Perform Home Exercise Program: Independently PT Goal: Perform Home Exercise Program - Progress: Goal set today Additional Goals Additional Goal #1: pt. will state and adhere to 3/3 posterior hip precautions PT Goal: Additional Goal #1 - Progress: Goal set today  Visit Information  Last PT Received On: 01/12/13 Assistance Needed: +1 PT/OT Co-Evaluation/Treatment: Yes    Subjective Data  Subjective: Pt. reports he wants to go home today Patient Stated Goal: resume his normal function.  Reports he recently lost his job.   Prior Functioning  Home Living Lives With: Spouse;Daughter Available Help at Discharge: Family;Available 24 hours/day Type of Home: House Home Access: Stairs to enter [employer] of Steps: 3 Entrance Stairs-Rails: None Home Layout: One level Home Adaptive Equipment: None;Other (comment) (pt. says he may have access to RW) Additional Comments: wife wil check on possible availability of RW Prior Function Level of Independence: Independent Able to Take Stairs?: Yes Driving: Yes Vocation: Unemployed Communication Communication: No difficulties    Cognition  Cognition Arousal/Alertness: Awake/alert Behavior During Therapy: Agitated Overall Cognitive Status: Within Functional Limits for tasks assessed    Extremity/Trunk Assessment Right Upper Extremity Assessment RUE ROM/Strength/Tone: Within functional levels Left Upper Extremity Assessment LUE ROM/Strength/Tone: Within functional levels Right Lower Extremity Assessment RLE ROM/Strength/Tone: Deficits;Unable to fully assess;Due to pain;Due to precautions RLE ROM/Strength/Tone Deficits: good ankle pump, fair quad set RLE  Sensation: WFL - Light Touch Left Lower Extremity Assessment LLE ROM/Strength/Tone: WFL for tasks assessed LLE Sensation: WFL - Light Touch Trunk Assessment Trunk Assessment: Normal   Balance    End of Session PT - End of Session Equipment Utilized During Treatment: Gait belt;Right knee immobilizer Activity Tolerance: Patient limited by pain;Patient limited by fatigue Patient left: in bed;with call bell/phone within reach;with family/visitor present Nurse Communication: Mobility status;Precautions;Weight bearing status  GP     Ferman Hamming 01/12/2013, 12:54 PM Weldon Picking PT Acute Rehab Services [phone] [place] [phone]

## 2013-01-13 LAB — BASIC METABOLIC PANEL
CO2: 26 mEq/L (ref 19–32)
Chloride: 99 mEq/L (ref 96–112)
Sodium: 134 mEq/L — ABNORMAL LOW (ref 135–145)

## 2013-01-13 LAB — CBC
Platelets: 144 10*3/uL — ABNORMAL LOW (ref 150–400)
RBC: 3.29 MIL/uL — ABNORMAL LOW (ref 4.22–5.81)
WBC: 8.6 10*3/uL (ref 4.0–10.5)

## 2013-01-13 NOTE — Progress Notes (Signed)
Occupational Therapy Treatment Patient Details Name: [name] MRN: [id number] DOB: [birth year] Today's Date: 01/13/2013 Time: 6213-0865 OT Time Calculation (min): 39 min  OT Assessment / Plan / Recommendation Comments on Treatment Session Pt practiced shower transfer and OT educated how to perform LB dressing with reacher and to use long handled sponge for bathing. Pt not wanting to practice as he was already dressed.     Follow Up Recommendations  Home health OT;Supervision/Assistance - 24 hour    Barriers to Discharge       Equipment Recommendations  3 in 1 bedside comode    Recommendations for Other Services    Frequency Min 2X/week   Plan Discharge plan remains appropriate    Precautions / Restrictions Precautions Precautions: Posterior Hip;Fall Precaution Booklet Issued: Yes (comment) Precaution Comments: pt recalled 2/3 THP so re educated and also demonstrated during sit to stand to sit.  Handout also given. Restrictions Weight Bearing Restrictions: No RLE Weight Bearing: Weight bearing as tolerated Other Position/Activity Restrictions: posterior total hip precautions   Pertinent Vitals/Pain Pain 6/10 with activity. Premedicated. Repositioned.     ADL  Toilet Transfer: Hydrographic surveyor Method: Sit to Barista: Other (comment) (from bed) Tub/Shower Transfer: Simulated;Minimal assistance Tub/Shower Transfer Method: Science writer: Other (comment);Walk in shower (3 in 1) Equipment Used: Gait belt;Rolling walker;Reacher;Long-handled sponge;Long-handled shoe horn Transfers/Ambulation Related to ADLs: Minguard ADL Comments: OT educated on AE. Showed pt how to use reacher for LB dressing (says he has one at home). Pt not wanting to see sockaid as he says he doesn't wear them. Pt practiced shower transfer at Min A level for safe technique and to maneuver walker.     OT Diagnosis:    OT Problem List:   OT  Treatment Interventions:     OT Goals Acute Rehab OT Goals OT Goal Formulation: With patient/family Time For Goal Achievement: 01/19/13 Potential to Achieve Goals: Good ADL Goals Pt Will Perform Lower Body Bathing: with modified independence;Sit to stand from chair;with adaptive equipment Pt Will Perform Lower Body Dressing: with modified independence;Sit to stand from bed;Sit to stand from chair;with adaptive equipment Pt Will Transfer to Toilet: with modified independence;Ambulation;with DME ADL Goal: Toilet Transfer - Progress: Progressing toward goals Pt Will Perform Toileting - Clothing Manipulation: with modified independence;Standing Pt Will Perform Toileting - Hygiene: with modified independence;Sit to stand from 3-in-1/toilet;Sitting on 3-in-1 or toilet Pt Will Perform Tub/Shower Transfer: Tub transfer;with supervision;Ambulation;with DME (pt has walk in shower) ADL Goal: Tub/Shower Transfer - Progress: Progressing toward goals Miscellaneous OT Goals Miscellaneous OT Goal #1: Pt will verbalize and demonstrate 3/3 hip precautions independently. OT Goal: Miscellaneous Goal #1 - Progress: Progressing toward goals  Visit Information  Last OT Received On: 01/13/13 Assistance Needed: +1 PT/OT Co-Evaluation/Treatment: Yes    Subjective Data      Prior Functioning       Cognition  Cognition Arousal/Alertness: Awake/alert Behavior During Therapy: Agitated Overall Cognitive Status: Within Functional Limits for tasks assessed    Mobility  Bed Mobility Bed Mobility: Supine to Sit;Sitting - Scoot to [place] of Bed;Sit to Supine Supine to Sit: 4: Min guard Sitting - Scoot to [place] of Bed: 4: Min guard Details for Bed Mobility Assistance: demonstarted and instructed pt how to use a belt or looped bed sheet to assist R LE off bed.  Pt was able to perform with increased time and Vc's. Transfers Transfers: Sit to Stand;Stand to Sit Sit to Stand: 4: Min guard;With upper extremity  assist;From bed Stand to Sit: 4: Min guard;With upper extremity assist;To bed Details for Transfer Assistance: vc's on proper technique and hand placement as well as to maintain hip precautions.    Exercises      Balance     End of Session OT - End of Session Equipment Utilized During Treatment: Gait belt Activity Tolerance: Patient tolerated treatment well Patient left: in bed;with call bell/phone within reach  GO     Earlie Raveling OTR/L [phone] 01/13/2013, 12:51 PM

## 2013-01-13 NOTE — Progress Notes (Signed)
Physical Therapy Treatment Patient Details Name: DRAGO HAMMONDS MRN: [id number] DOB: 07/04/1966 Today's Date: 01/13/2013 Time: 7829-5621 PT Time Calculation (min): 26 min  PT Assessment / Plan / Recommendation Comments on Treatment Session  POD # 2 R THR posterior approach planning to D/C to home today.  Assisted pt OOB to amb to Ortho Gym to practice steps then returned to room.  Instructed on THP and given handout.  Instructed on use of ICE.  Instructed on KI use for in bed only for positioning.  Given HEP handout and instructed on frequency.     Follow Up Recommendations  Home health PT;Supervision/Assistance - 24 hour;Supervision for mobility/OOB     Does the patient have the potential to tolerate intense rehabilitation     Barriers to Discharge        Equipment Recommendations       Recommendations for Other Services    Frequency 7X/week   Plan Discharge plan remains appropriate    Precautions / Restrictions Precautions Precautions: Posterior Hip;Fall Precaution Booklet Issued: Yes (comment) Precaution Comments: pt recalled 2/3 THP so re educated and also demonstrated during sit to stand to sit.  Handout also given. Restrictions Weight Bearing Restrictions: No RLE Weight Bearing: Weight bearing as tolerated   Pertinent Vitals/Pain C/o 6/10 with act Pre medicated ICE applied    Mobility  Bed Mobility Bed Mobility: Supine to Sit;Sitting - Scoot to [place] of Bed;Sit to Supine Supine to Sit: 4: Min guard Sitting - Scoot to [place] of Bed: 4: Min guard Details for Bed Mobility Assistance: demonstarted and instructed pt how to use a belt or looped bed sheet to assist R LE off bed.  Pt was able to perform with increased time and Vc's. Transfers Transfers: Sit to Stand;Stand to Sit Sit to Stand: 4: Min guard;5: Supervision;From bed Stand to Sit: 4: Min guard;5: Supervision;To bed Details for Transfer Assistance: 25% VC's on proper tech and hand placement plus VC's to avoid hip  flex > 90. Ambulation/Gait Ambulation/Gait Assistance: 4: Min guard Ambulation Distance (Feet): 15 Feet Assistive device: Rolling walker Ambulation/Gait Assistance Details: 25% VC's on proper R LE placement in middle of walker vs front plus safety with turns as pt demon some impulsiveness.  Amb from his room to Ortho room to practice steps. Gait Pattern: Step-to pattern;Decreased step length - right;Decreased step length - left;Decreased stance time - right Gait velocity: decreased Stairs: Yes Stairs Assistance: 4: Min assist Stairs Assistance Details (indicate cue type and reason): 50% VC's on proper tech and safety.  Handout also given. Stair Management Technique: No rails;Backwards;With walker Number of Stairs: 2     PT Goals                                                  progressing    Visit Information  Last PT Received On: 01/13/13 Assistance Needed: +1    Subjective Data      Cognition    good Eager to get home   Balance   fair  End of Session PT - End of Session Equipment Utilized During Treatment: Gait belt Activity Tolerance: Patient limited by fatigue Patient left: in bed   Felecia Shelling  PTA [facility]  Acute  Rehab Pager      [phone]

## 2013-01-23 ENCOUNTER — Inpatient Hospital Stay (HOSPITAL_COMMUNITY): Admission: RE | Admit: 2013-01-23 | Payer: BC Managed Care – HMO | Source: Ambulatory Visit

## 2016-09-03 ENCOUNTER — Ambulatory Visit (INDEPENDENT_AMBULATORY_CARE_PROVIDER_SITE_OTHER): Payer: Self-pay

## 2016-09-03 ENCOUNTER — Telehealth (INDEPENDENT_AMBULATORY_CARE_PROVIDER_SITE_OTHER): Payer: Self-pay | Admitting: Orthopaedic Surgery

## 2016-09-03 ENCOUNTER — Ambulatory Visit (INDEPENDENT_AMBULATORY_CARE_PROVIDER_SITE_OTHER): Payer: BLUE CROSS/BLUE SHIELD | Admitting: Orthopaedic Surgery

## 2016-09-03 ENCOUNTER — Encounter (INDEPENDENT_AMBULATORY_CARE_PROVIDER_SITE_OTHER): Payer: Self-pay | Admitting: Orthopaedic Surgery

## 2016-09-03 ENCOUNTER — Ambulatory Visit (INDEPENDENT_AMBULATORY_CARE_PROVIDER_SITE_OTHER): Payer: BLUE CROSS/BLUE SHIELD

## 2016-09-03 VITALS — BP 138/83 | HR 79 | Ht 72.0 in | Wt 240.0 lb

## 2016-09-03 DIAGNOSIS — M25552 Pain in left hip: Secondary | ICD-10-CM

## 2016-09-03 NOTE — Telephone Encounter (Signed)
Patient is requesting something for pain, prior to having surgery since he doesn't know his surgery date yet. Please advise.

## 2016-09-03 NOTE — Progress Notes (Signed)
Office Visit Note   Patient: [name]           Date of Birth: 08-19-1965           MRN: [id number] Visit Date: 09/03/2016              Requested by: Jearld Fenton, NP [place],  [id number] PCP: Webb Silversmith, NP   Assessment & Plan: Visit Diagnoses:  1. Pain in left hip   [name] is nearly 4 years status post successful right total hip replacement for avascular necrosis presumably on the basis of his heavy alcohol consumption. He now has evidence of avascular necrosis and degenerative arthrosis of his left hip. He has considerable compromise of his daily activities.  Plan: Long discussion regarding treatment options. He would like to proceed with a left total hip replacement. We need to have him cleared through the urgent care Center near his home as he does not have a primary care physician  Follow-Up Instructions: No Follow-up on file.   Orders:  Orders Placed This Encounter  Procedures  . XR HIP UNILAT W OR W/O PELVIS 2-3 VIEWS LEFT  . XR Lumbar Spine 2-3 Views   No orders of the defined types were placed in this encounter.     Procedures: No procedures performed   Clinical Data: No additional findings.   Subjective: Chief Complaint  Patient presents with  . Left Hip - Pain    Patient is 51 y.o male who presents today with left leg pain, ongoing for over a year. No known injury. He complains of pain at left groin lateral hip and posteriorly. He has difficulty walking. He denies any radicular pain. He denies numbness or tingling. He does complain of burning. Patient states pain is constant and has sleep disturbances due to his pain. Patient states pain worsens with activity and standing. He is unable to lay on left side. He is taking percocet 10 mg once daily.   [name] relates that he has not had follow-up for his right total hip replacement as he lacked any health insurance. He now has insurance and is having considerable  trouble with his left hip. [name] trouble sleeping at beer, bearing weight and performing activities of daily living. He also notes that his alcohol consumption is significantly decreased  Review of Systems   Objective: Vital Signs: BP 138/83 (BP Location: Left Arm, Patient Position: Sitting)   Pulse 79   Ht 6' (1.829 m)   Wt 240 lb (108.9 kg)   BMI 32.55 kg/m   Physical Exam  Ortho Exam straight leg raise negative bilaterally reflexes were symmetrical. Leg lengths are symmetrical. Painless range of motion of his right hip with flexion and extension and internal and external rotation. Motion of the left hip with internal/external rotation with pain flexed over 100 pain with extension in the groin and anterior thigh. Neurovascular exam intact.  Specialty Comments:  No specialty comments available.  Imaging: No results found.   PMFS History: Patient Active Problem List   Diagnosis Date Noted  . Rectal bleeding 11/16/2012   Past Medical History:  Diagnosis Date  . Medical history non-contributory     Family History  Problem Relation Age of Onset  . Cancer Father     Hodgkin's disease  . Diabetes Neg Hx   . Stroke Neg Hx   . Hypertension Neg Hx   . Hyperlipidemia Neg Hx     Past Surgical History:  Procedure Laterality Date  . CARPAL TUNNEL RELEASE Bilateral 2009  . COLONOSCOPY W/ POLYPECTOMY    . JOINT REPLACEMENT    . LACERATION REPAIR Right ~ 2012   leg  . 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 Smoking status: Former Smoker    Packs/day: 1.00    Years: 14.00    Types: Cigarettes    Quit date: 07/27/1999  . Smokeless tobacco: Never Used  . Alcohol use 33.6 oz/week    56 Cans of beer per week     Comment: "01/12/2013  "averages an 8 pack of beer/day"  . Drug use: No  . Sexual activity: Yes

## 2016-09-07 NOTE — Telephone Encounter (Signed)
Please advise 

## 2016-09-07 NOTE — Telephone Encounter (Signed)
Spoke with pt

## 2016-09-07 NOTE — Telephone Encounter (Signed)
No meds until surgery scheduled

## 2016-09-15 ENCOUNTER — Ambulatory Visit (INDEPENDENT_AMBULATORY_CARE_PROVIDER_SITE_OTHER): Payer: Self-pay | Admitting: Orthopedic Surgery

## 2016-09-21 ENCOUNTER — Telehealth (INDEPENDENT_AMBULATORY_CARE_PROVIDER_SITE_OTHER): Payer: Self-pay | Admitting: Orthopaedic Surgery

## 2016-09-21 ENCOUNTER — Other Ambulatory Visit (INDEPENDENT_AMBULATORY_CARE_PROVIDER_SITE_OTHER): Payer: Self-pay | Admitting: Orthopedic Surgery

## 2016-09-21 NOTE — Telephone Encounter (Signed)
LVM for pt to call and schedule surgery. Will try pt again at a later time.

## 2016-09-27 ENCOUNTER — Telehealth (INDEPENDENT_AMBULATORY_CARE_PROVIDER_SITE_OTHER): Payer: Self-pay | Admitting: Orthopaedic Surgery

## 2016-09-27 NOTE — Telephone Encounter (Signed)
Baker Janus from [facility] called requesting orders for this pt. He is scheduled for surgery on the 20th, but has pre-op visit with them this week. Thanks so much!

## 2016-09-28 ENCOUNTER — Telehealth (INDEPENDENT_AMBULATORY_CARE_PROVIDER_SITE_OTHER): Payer: Self-pay | Admitting: Orthopaedic Surgery

## 2016-09-28 NOTE — Telephone Encounter (Signed)
Please advise 

## 2016-09-28 NOTE — Pre-Procedure Instructions (Addendum)
[name]  09/28/2016      [name] #[id number] [name], [name] [address] [facility] [place] [address] [place] [address] Phone: [phone] Fax: [phone]    Your procedure is scheduled on  March 20  Report to [name] Admitting at 5:30  A.M.  Call this number if you have problems the morning of surgery:  [phone]   Remember:  Do not eat food or drink liquids after midnight on Mar. 19  Take these medicines the morning of surgery with A SIP OF WATER : oxycodone if needed              1 week prior to surgery stop aspirin, advil, motrin, ibuprofen. Aleve, vitamins/herbal medicines.   Do not wear jewelry.  Do not wear lotions, powders, or perfumes, or deoderant.  Do not shave 48 hours prior to surgery.  Men may shave face and neck.  Do not bring valuables to the hospital.  [name] is not responsible for any belongings or valuables.  Contacts, dentures or bridgework may not be worn into surgery.  Leave your suitcase in the car.  After surgery it may be brought to your room.  For patients admitted to the hospital, discharge time will be determined by your treatment team.  Patients discharged the day of surgery will not be allowed to drive home.    Special instructions:   [name]  Before surgery, you can play an important role. Because skin is not sterile, your skin needs to be as free of germs as possible. You can reduce the number of germs on your skin by washing with CHG (chlorahexidine gluconate) Soap before surgery.  CHG is an antiseptic cleaner which kills germs and bonds with the skin to continue killing germs even after washing.  Please do not use if you have an allergy to CHG or antibacterial soaps. If your skin becomes reddened/irritated stop using the CHG.  Do not shave (including legs and underarms) for at least 48 hours prior to first CHG shower. It is OK to shave your  face.  Please follow these instructions carefully.   1. Shower the NIGHT BEFORE SURGERY and the MORNING OF SURGERY with CHG.   2. If you chose to wash your hair, wash your hair first as usual with your normal shampoo.  3. After you shampoo, rinse your hair and body thoroughly to remove the shampoo.  4. Use CHG as you would any other liquid soap. You can apply CHG directly to the skin and wash gently with a scrungie or a clean washcloth.   5. Apply the CHG Soap to your body ONLY FROM THE NECK DOWN.  Do not use on open wounds or open sores. Avoid contact with your eyes, ears, mouth and genitals (private parts). Wash genitals (private parts) with your normal soap.  6. Wash thoroughly, paying special attention to the area where your surgery will be performed.  7. Thoroughly rinse your body with warm water from the neck down.  8. DO NOT shower/wash with your normal soap after using and rinsing off the CHG Soap.  9. Pat yourself dry with a CLEAN TOWEL.   10. Wear CLEAN PAJAMAS   11. Place CLEAN SHEETS on your bed the night of your first shower and DO NOT SLEEP WITH PETS.    Day of Surgery: Do not apply any deodorants/lotions. Please wear clean clothes to the hospital/surgery [name].  Please read over the following fact sheets that you were given. Coughing and Deep Breathing, Total Joint Packet and MRSA Information

## 2016-09-28 NOTE — Telephone Encounter (Signed)
[name] a pre-admissions nurse with [facility] called in regards to [name]  He has a pre op appointment tomorrow morning and there are no orders in Epic.  [id number].  Thank you.

## 2016-09-29 ENCOUNTER — Ambulatory Visit (INDEPENDENT_AMBULATORY_CARE_PROVIDER_SITE_OTHER): Payer: BLUE CROSS/BLUE SHIELD | Admitting: Orthopedic Surgery

## 2016-09-29 ENCOUNTER — Encounter (INDEPENDENT_AMBULATORY_CARE_PROVIDER_SITE_OTHER): Payer: Self-pay | Admitting: Orthopedic Surgery

## 2016-09-29 ENCOUNTER — Encounter (HOSPITAL_COMMUNITY)
Admission: RE | Admit: 2016-09-29 | Discharge: 2016-09-29 | Disposition: A | Discharge status: Home / Self Care | Payer: BLUE CROSS/BLUE SHIELD | Source: Ambulatory Visit | Attending: Orthopedic Surgery | Admitting: Orthopedic Surgery

## 2016-09-29 ENCOUNTER — Encounter (HOSPITAL_COMMUNITY): Payer: Self-pay

## 2016-09-29 ENCOUNTER — Encounter (HOSPITAL_COMMUNITY)
Admission: RE | Admit: 2016-09-29 | Discharge: 2016-09-29 | Disposition: A | Discharge status: Home / Self Care | Payer: BLUE CROSS/BLUE SHIELD | Source: Ambulatory Visit | Attending: Orthopaedic Surgery | Admitting: Orthopaedic Surgery

## 2016-09-29 VITALS — BP 155/95 | HR 79 | Temp 98.2°F | Resp 14 | Ht 72.0 in | Wt 255.0 lb

## 2016-09-29 DIAGNOSIS — Z01810 Encounter for preprocedural cardiovascular examination: Secondary | ICD-10-CM | POA: Diagnosis present

## 2016-09-29 DIAGNOSIS — M87052 Idiopathic aseptic necrosis of left femur: Secondary | ICD-10-CM | POA: Diagnosis not present

## 2016-09-29 DIAGNOSIS — Z01818 Encounter for other preprocedural examination: Secondary | ICD-10-CM

## 2016-09-29 DIAGNOSIS — M87852 Other osteonecrosis, left femur: Secondary | ICD-10-CM | POA: Insufficient documentation

## 2016-09-29 DIAGNOSIS — Z01812 Encounter for preprocedural laboratory examination: Secondary | ICD-10-CM | POA: Diagnosis present

## 2016-09-29 LAB — CBC WITH DIFFERENTIAL/PLATELET
Basophils Absolute: 0 10*3/uL (ref 0.0–0.1)
Basophils Relative: 0 %
EOS ABS: 0.1 10*3/uL (ref 0.0–0.7)
EOS PCT: 2 %
HCT: 44.4 % (ref 39.0–52.0)
Hemoglobin: 15.7 g/dL (ref 13.0–17.0)
LYMPHS ABS: 1.6 10*3/uL (ref 0.7–4.0)
Lymphocytes Relative: 21 %
MCH: 35.2 pg — AB (ref 26.0–34.0)
MCHC: 35.4 g/dL (ref 30.0–36.0)
MCV: 99.6 fL (ref 78.0–100.0)
MONO ABS: 0.6 10*3/uL (ref 0.1–1.0)
Monocytes Relative: 8 %
Neutro Abs: 5.1 10*3/uL (ref 1.7–7.7)
Neutrophils Relative %: 69 %
PLATELETS: 229 10*3/uL (ref 150–400)
RBC: 4.46 MIL/uL (ref 4.22–5.81)
RDW: 12.4 % (ref 11.5–15.5)
WBC: 7.4 10*3/uL (ref 4.0–10.5)

## 2016-09-29 LAB — COMPREHENSIVE METABOLIC PANEL
ALT: 20 U/L (ref 17–63)
ANION GAP: 7 (ref 5–15)
AST: 25 U/L (ref 15–41)
Albumin: 3.8 g/dL (ref 3.5–5.0)
Alkaline Phosphatase: 89 U/L (ref 38–126)
BUN: 7 mg/dL (ref 6–20)
CHLORIDE: 106 mmol/L (ref 101–111)
CO2: 24 mmol/L (ref 22–32)
CREATININE: 0.95 mg/dL (ref 0.61–1.24)
Calcium: 9.3 mg/dL (ref 8.9–10.3)
Glucose, Bld: 100 mg/dL — ABNORMAL HIGH (ref 65–99)
POTASSIUM: 4.2 mmol/L (ref 3.5–5.1)
SODIUM: 137 mmol/L (ref 135–145)
Total Bilirubin: 0.8 mg/dL (ref 0.3–1.2)
Total Protein: 7.1 g/dL (ref 6.5–8.1)

## 2016-09-29 LAB — TYPE AND SCREEN
ABO/RH(D): O POS
ANTIBODY SCREEN: NEGATIVE

## 2016-09-29 LAB — APTT: APTT: 29 s (ref 24–36)

## 2016-09-29 LAB — SURGICAL PCR SCREEN
MRSA, PCR: NEGATIVE
Staphylococcus aureus: NEGATIVE

## 2016-09-29 LAB — PROTIME-INR
INR: 1.06
PROTHROMBIN TIME: 13.9 s (ref 11.4–15.2)

## 2016-09-29 IMAGING — CR DG CHEST 2V
2 series · 2 of 2 positions shown · non-contrast
Comparison: [DATE]

CLINICAL DATA: Preop testing for hip replacement

EXAM:
CHEST  2 VIEW

[w chest pa]
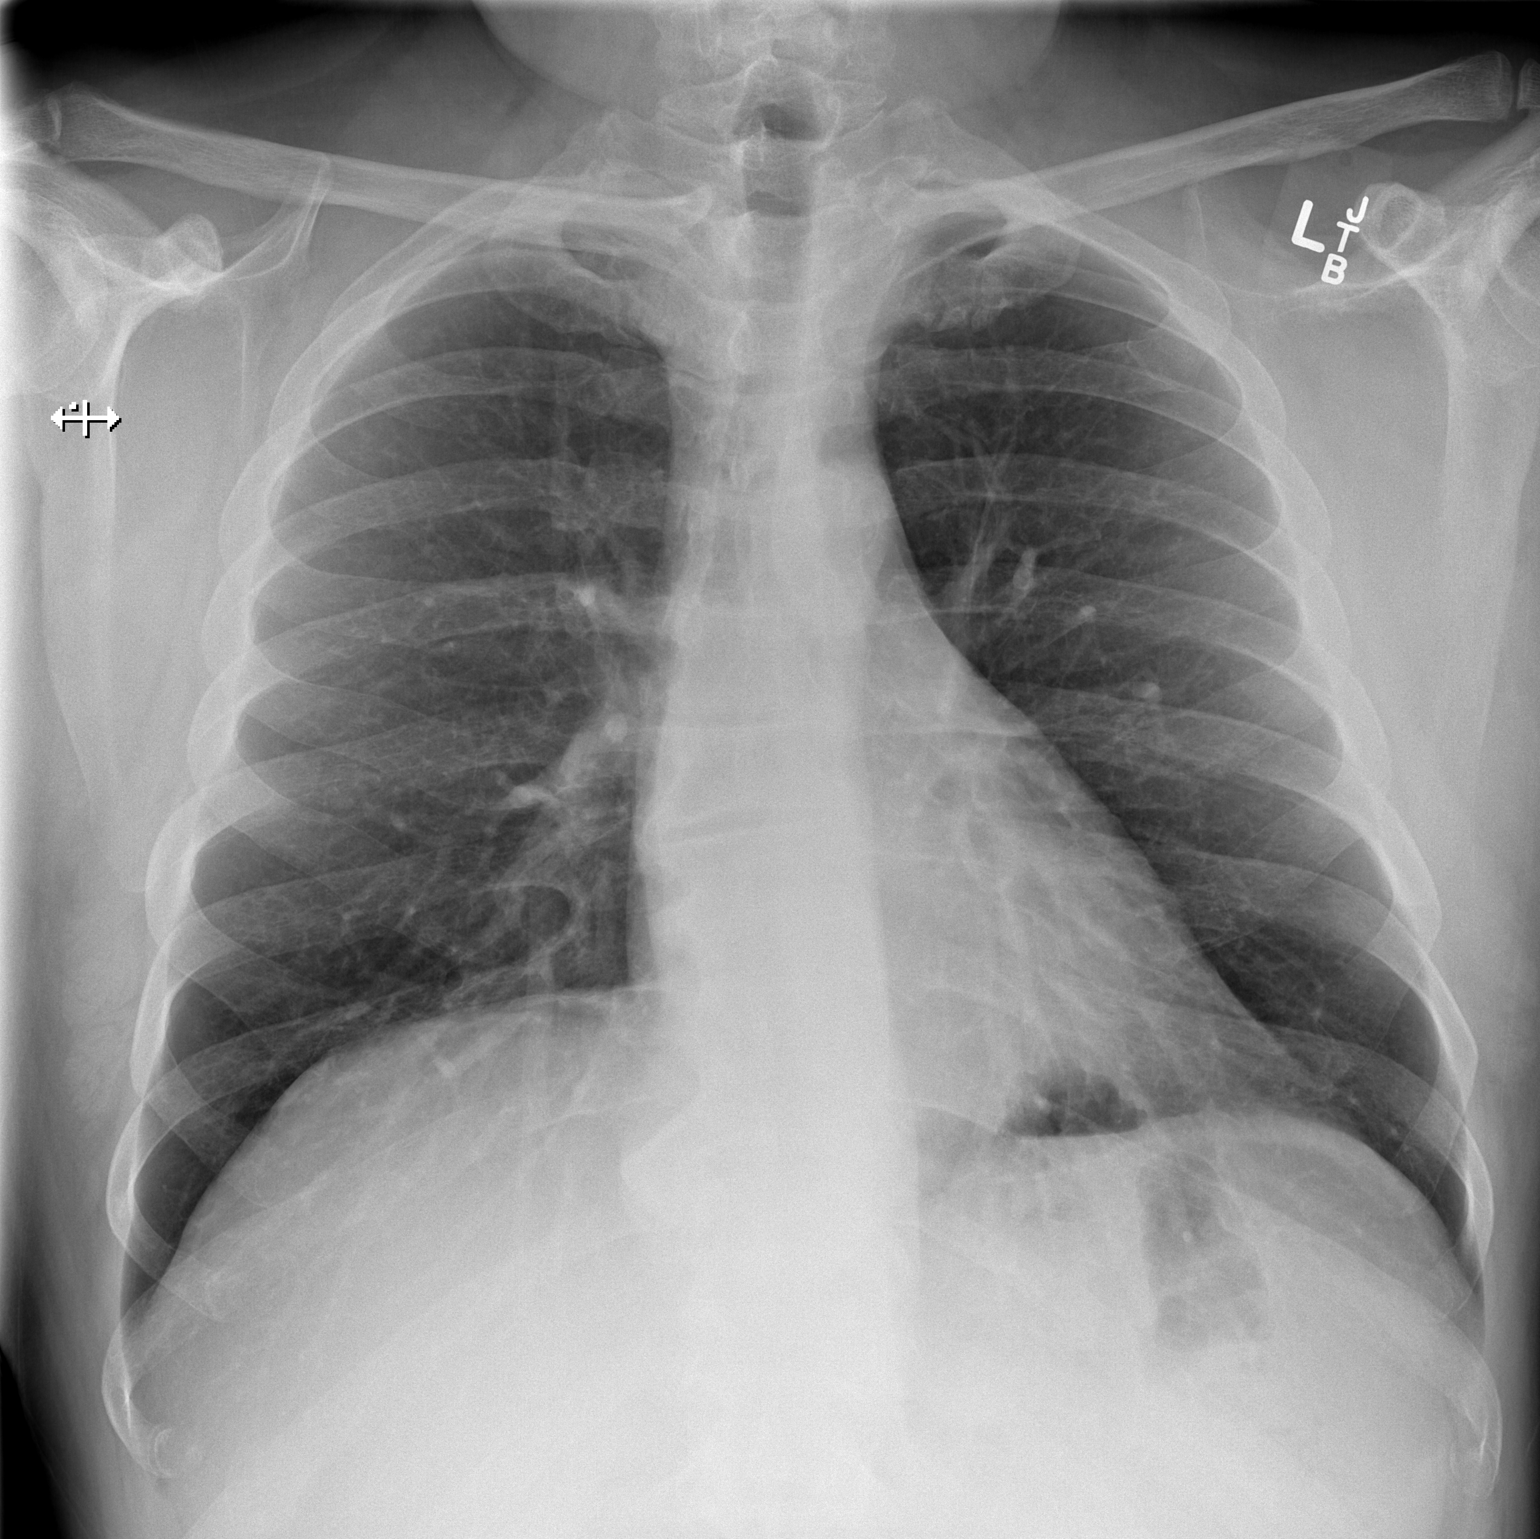

[w chest lat]
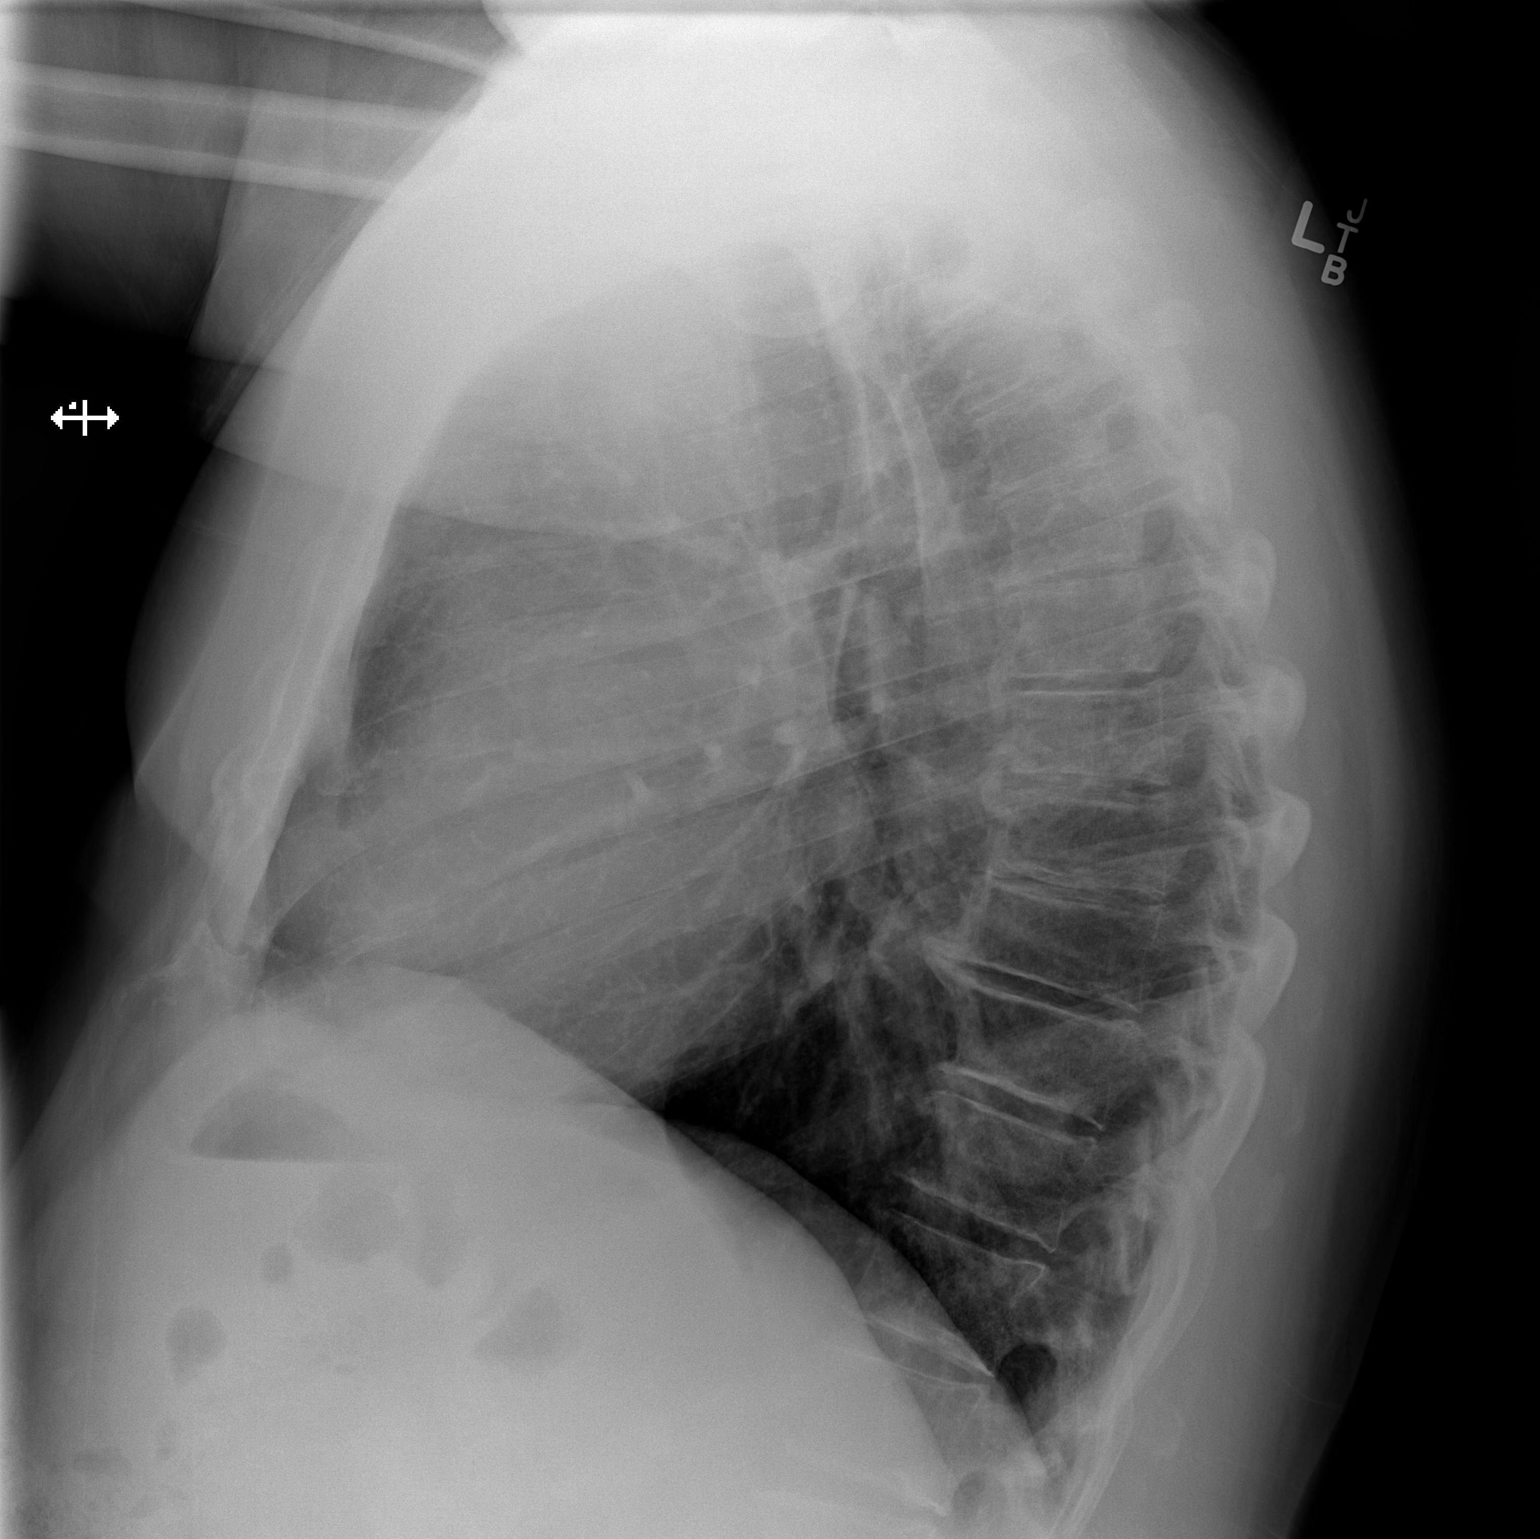

[2 of 2 positions shown; findings below may reference images not displayed]

FINDINGS: Heart and mediastinal contours are within normal limits. No focal
opacities or effusions. No acute bony abnormality.
IMPRESSION: No active cardiopulmonary disease.

## 2016-09-29 NOTE — Progress Notes (Signed)
Joni Fears, MD   Biagio Borg, PA-C [address] [place], [place]  [address]                             [phone]   [name] MRN:  [id number] DOB/SEX:  04/30/1966/male  ORTHOPAEDIC HISTORY & PHYSICAL  CHIEF COMPLAINT:  Painful left Hip  HISTORY: [name] a 51 y.o. male  Who has a history of pain and functional disability in the left hip(s) due to Avascular Necrosis and patient has failed non-surgical conservative treatments for greater than 12 weeks to include NSAID's and/or analgesics, flexibility and strengthening excercises, use of assistive devices, weight reduction as appropriate and activity modification.  Onset of symptoms was gradual starting 1 years ago with gradually worsening course since that time.The patient noted no past surgery on the left hip(s).  Patient currently rates pain in the left hip at 8 out of 10 with activity. Patient has night pain, worsening of pain with activity and weight bearing, trendelenberg gait, pain that interfers with activities of daily living, pain with passive range of motion and crepitus. Patient has evidence of subchondral cysts, subchondral sclerosis, periarticular osteophytes and joint space narrowing by imaging studies. This condition presents safety issues increasing the risk of falls. This patient has had avascular necrosis of the hip, acetabular fracture, hip dysplasia.  There is no current active infection.  PAST MEDICAL HISTORY: Patient Active Problem List   Diagnosis Date Noted  . Rectal bleeding 11/16/2012   Past Medical History:  Diagnosis Date  . Medical history non-contributory    Past Surgical History:  Procedure Laterality Date  . CARPAL TUNNEL RELEASE Bilateral 2009  . COLONOSCOPY W/ POLYPECTOMY    . JOINT REPLACEMENT    . LACERATION REPAIR Right ~ 2012   leg  . TOTAL HIP ARTHROPLASTY Right 01/11/2013   Procedure: TOTAL HIP ARTHROPLASTY;  Surgeon: Garald Balding, MD;  Location: [facility];  Service:  Orthopedics;  Laterality: Right;     MEDICATIONS PRIOR TO ADMISSION: No current facility-administered medications for this encounter.   Current Outpatient Prescriptions:  .  aspirin EC 81 MG tablet, Take 81 mg by mouth daily   ALLERGIES:   Allergies  Allergen Reactions  . Hydrocodone Nausea And Vomiting    REVIEW OF SYSTEMS:  Review of Systems  All other systems reviewed and are negative.   FAMILY HISTORY:   Family History  Problem Relation Age of Onset  . Cancer Father     Hodgkin's disease  . Diabetes Neg Hx   . Stroke Neg Hx   . Hypertension Neg Hx   . 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 Smoking status: Former Smoker    Packs/day: 1.00    Years: 14.00    Types: Cigarettes    Quit date: 07/27/1999  . Smokeless tobacco: Never Used  . Alcohol use 33.6 oz/week    56 Cans of beer per week     Comment: drinks 11 beers per day  . Drug use: No  . Sexual activity: Yes     EXAMINATION:  Vital signs in last 24 hours: BP (!) 155/95 (BP Location: Left Arm, Patient Position: Sitting, Cuff Size: Normal)   Pulse 79   Temp 98.2 F (36.8 C)   Resp 14  Ht 6' (1.829 m)   Wt 255 lb (115.7 kg)   BMI 34.58 kg/m   Physical Exam  Constitutional: He is oriented to person, place, and time. He appears well-developed and well-nourished.  HENT:  Head: Normocephalic and atraumatic.  Eyes: Conjunctivae and EOM are normal. Pupils are equal, round, and reactive to light.  Neck: Neck supple.  Cardiovascular: Normal rate, regular rhythm, normal heart sounds and intact distal pulses.   Pulmonary/Chest: Effort normal and breath sounds normal.  Abdominal: Soft. Bowel sounds are normal. He exhibits no mass. There is no tenderness.  Neurological: He is oriented to person, place, and time.  Skin: Skin is warm and dry.  Psychiatric: He has a normal mood and affect.  His behavior is normal. Judgment and thought content normal.   Ortho Exam   Motion of the left hip with internal/external rotation with pain flexed over 100 pain with extension in the groin and anterior thigh.  Imaging Review Plain radiographs demonstrate moderate degenerative joint disease of the left hip. The bone quality appears to be good for age and reported activity level. Cystic changes in the femoral head are noted with some collapse of the femoral head secondary to AVN.  Assessment: Avascular necrosis  left Hip  Past Medical History:  Diagnosis Date  . Medical history non-contributory     Plan: for left total hip replacement.  The patient history, physical examination, clinical judgement of the provider and imaging studies are consistent with end stage degenerative joint disease of the left hip(s) and total hip arthroplasty is deemed medically necessary. The treatment options including medical management, injection therapy, arthroscopy and arthroplasty were discussed at length. The risks and benefits of total hip arthroplasty were presented and reviewed. The risks due to aseptic loosening, infection, stiffness, dislocation/subluxation,  thromboembolic complications and other imponderables were discussed.  The patient acknowledged the explanation, agreed to proceed with the plan. The clearance notes recently received were reviewed and concurs with proceeding then surgical intervention.  Patient is being admitted for inpatient treatment for surgery, pain control, PT, OT, prophylactic antibiotics, VTE prophylaxis, progressive ambulation and ADL's and discharge planning.The patient is planning to be discharged home with home health services-   Biagio Borg  PA-C 09/29/2016, 3:57 PM

## 2016-09-29 NOTE — Telephone Encounter (Signed)
Orders were put in yesterday at about 1:00 PM

## 2016-09-29 NOTE — Progress Notes (Signed)
PCP: Dr. Daylene Posey @ [facility]--- request ekg tracing

## 2016-09-29 NOTE — Progress Notes (Deleted)
   Office Visit Note   Patient: SEBASTAIN FISHBAUGH           Date of Birth: October 28, 1965           MRN: [id number] Visit Date: 09/29/2016              Requested by: Jearld Fenton, NP [place], [place] [address] PCP: Webb Silversmith, NP   Assessment & Plan: Visit Diagnoses: No diagnosis found.  Plan: ***  Follow-Up Instructions: No Follow-up on file.   Orders:  No orders of the defined types were placed in this encounter.  No orders of the defined types were placed in this encounter.     Procedures: No procedures performed   Clinical Data: No additional findings.   Subjective: Chief Complaint  Patient presents with  . Left Hip - Pre-op Exam    Left hip replacement 10/12/16, percocet helps    Review of Systems   Objective: Vital Signs: BP (!) 155/95 (BP Location: Left Arm, Patient Position: Sitting, Cuff Size: Normal)   Pulse 79   Resp 14   Ht 6' (1.829 m)   Wt 255 lb (115.7 kg)   BMI 34.58 kg/m   Physical Exam  Ortho Exam  Specialty Comments:  No specialty comments available.  Imaging: Dg Chest 2 View  Result Date: 09/29/2016 CLINICAL DATA:  Preop testing for hip replacement EXAM: CHEST  2 VIEW COMPARISON:  01/09/2013 FINDINGS: Heart and mediastinal contours are within normal limits. No focal opacities or effusions. No acute bony abnormality. IMPRESSION: No active cardiopulmonary disease. Electronically Signed   By: Rolm Baptise M.D.   On: 09/29/2016 08:47     PMFS History: Patient Active Problem List   Diagnosis Date Noted  . Rectal bleeding 11/16/2012   Past Medical History:  Diagnosis Date  . Medical history non-contributory     Family History  Problem Relation Age of Onset  . Cancer Father     Hodgkin's disease  . Diabetes Neg Hx   . Stroke Neg Hx   . Hypertension Neg Hx   . Hyperlipidemia Neg Hx     Past Surgical History:  Procedure Laterality Date  . CARPAL TUNNEL RELEASE Bilateral 2009  . COLONOSCOPY W/  POLYPECTOMY    . JOINT REPLACEMENT    . LACERATION REPAIR Right ~ 2012   leg  . 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 Smoking status: Former Smoker    Packs/day: 1.00    Years: 14.00    Types: Cigarettes    Quit date: 07/27/1999  . Smokeless tobacco: Never Used  . Alcohol use 33.6 oz/week    56 Cans of beer per week     Comment: drinks 11 beers per day  . Drug use: No  . Sexual activity: Yes

## 2016-09-29 NOTE — H&P (Addendum)
Joni Fears, MD   Biagio Borg, PA-C [address] [place], [place]  [address]                             [phone]   EMANUELE MCWHIRTER MRN:  [id number] DOB/SEX:  [birth year]/male  ORTHOPAEDIC HISTORY & PHYSICAL  CHIEF COMPLAINT:  Painful left Hip  HISTORY: DAYMEN HASSEBROCK a 51 y.o. male  Who has a history of pain and functional disability in the left hip(s) due to Avascular Necrosis and patient has failed non-surgical conservative treatments for greater than 12 weeks to include NSAID's and/or analgesics, flexibility and strengthening excercises, use of assistive devices, weight reduction as appropriate and activity modification.  Onset of symptoms was gradual starting 1 years ago with gradually worsening course since that time.The patient noted no past surgery on the left hip(s).  Patient currently rates pain in the left hip at 8 out of 10 with activity. Patient has night pain, worsening of pain with activity and weight bearing, trendelenberg gait, pain that interfers with activities of daily living, pain with passive range of motion and crepitus. Patient has evidence of subchondral cysts, subchondral sclerosis, periarticular osteophytes and joint space narrowing by imaging studies. This condition presents safety issues increasing the risk of falls. This patient has had avascular necrosis of the hip, acetabular fracture, hip dysplasia.  There is no current active infection.  PAST MEDICAL HISTORY: Patient Active Problem List   Diagnosis Date Noted  . Rectal bleeding 11/16/2012   Past Medical History:  Diagnosis Date  . Medical history non-contributory    Past Surgical History:  Procedure Laterality Date  . CARPAL TUNNEL RELEASE Bilateral 2009  . COLONOSCOPY W/ POLYPECTOMY    . JOINT REPLACEMENT    . LACERATION REPAIR Right ~ 2012   leg  . TOTAL HIP ARTHROPLASTY Right 01/11/2013   Procedure: TOTAL HIP ARTHROPLASTY;  Surgeon: Garald Balding, MD;  Location: [facility];   Service: Orthopedics;  Laterality: Right;     MEDICATIONS PRIOR TO ADMISSION: No current facility-administered medications for this encounter.   Current Outpatient Prescriptions:  .  aspirin EC 81 MG tablet, Take 81 mg by mouth daily   ALLERGIES:   Allergies  Allergen Reactions  . Hydrocodone Nausea And Vomiting    REVIEW OF SYSTEMS:  Review of Systems  All other systems reviewed and are negative.   FAMILY HISTORY:   Family History  Problem Relation Age of Onset  . Cancer Father     Hodgkin's disease  . Diabetes Neg Hx   . Stroke Neg Hx   . Hypertension Neg Hx   . 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 Smoking status: Former Smoker    Packs/day: 1.00    Years: 14.00    Types: Cigarettes    Quit date: 07/27/1999  . Smokeless tobacco: Never Used  . Alcohol use 33.6 oz/week    56 Cans of beer per week     Comment: drinks 11 beers per day  . Drug use: No  . Sexual activity: Yes     EXAMINATION:  Vital signs in last 24 hours: BP (!) 155/95 (BP Location: Left Arm, Patient Position: Sitting, Cuff Size: Normal)   Pulse 79   Temp 98.2 F (36.8 C)   Resp  14   Ht 6' (1.829 m)   Wt 255 lb (115.7 kg)   BMI 34.58 kg/m   Physical Exam  Constitutional: He is oriented to person, place, and time. He appears well-developed and well-nourished.  HENT:  Head: Normocephalic and atraumatic.  Eyes: Conjunctivae and EOM are normal. Pupils are equal, round, and reactive to light.  Neck: Neck supple.  Cardiovascular: Normal rate, regular rhythm, normal heart sounds and intact distal pulses.   Pulmonary/Chest: Effort normal and breath sounds normal.  Abdominal: Soft. Bowel sounds are normal. He exhibits no mass. There is no tenderness.  Neurological: He is oriented to person, place, and time.  Skin: Skin is warm and dry.  Psychiatric: He has a normal mood and  affect. His behavior is normal. Judgment and thought content normal.   Ortho Exam   Motion of the left hip with internal/external rotation with pain flexed over 100 pain with extension in the groin and anterior thigh.  Imaging Review Plain radiographs demonstrate moderate degenerative joint disease of the left hip. The bone quality appears to be good for age and reported activity level. Cystic changes in the femoral head are noted with some collapse of the femoral head secondary to AVN.  Assessment: Avascular necrosis  left Hip  Past Medical History:  Diagnosis Date  . Medical history non-contributory     Plan: for left total hip replacement.  The patient history, physical examination, clinical judgement of the provider and imaging studies are consistent with end stage degenerative joint disease of the left hip(s) and total hip arthroplasty is deemed medically necessary. The treatment options including medical management, injection therapy, arthroscopy and arthroplasty were discussed at length. The risks and benefits of total hip arthroplasty were presented and reviewed. The risks due to aseptic loosening, infection, stiffness, dislocation/subluxation,  thromboembolic complications and other imponderables were discussed.  The patient acknowledged the explanation, agreed to proceed with the plan. The clearance notes recently received were reviewed and concurs with proceeding then surgical intervention.  Patient is being admitted for inpatient treatment for surgery, pain control, PT, OT, prophylactic antibiotics, VTE prophylaxis, progressive ambulation and ADL's and discharge planning.The patient is planning to be discharged home with home health services-   Biagio Borg  PA-C 10/11/2016, 9:28 PM

## 2016-09-29 NOTE — Progress Notes (Deleted)
   Office Visit Note   Patient: [name]           Date of Birth: 1965/11/08           MRN: [id number] Visit Date: 09/29/2016              Requested by: Jearld Fenton, NP [place], [place] [id number] PCP: Webb Silversmith, NP   Assessment & Plan: Visit Diagnoses: No diagnosis found.  Plan: ***  Follow-Up Instructions: No Follow-up on file.   Orders:  No orders of the defined types were placed in this encounter.  No orders of the defined types were placed in this encounter.     Procedures: No procedures performed   Clinical Data: No additional findings.   Subjective: Chief Complaint  Patient presents with  . Left Hip - Pre-op Exam    HPI  Review of Systems   Objective: Vital Signs: BP (!) 155/95 (BP Location: Left Arm, Patient Position: Sitting, Cuff Size: Normal)   Pulse 79   Temp 98.2 F (36.8 C)   Resp 14   Ht 6' (1.829 m)   Wt 255 lb (115.7 kg)   BMI 34.58 kg/m   Physical Exam  Ortho Exam  Specialty Comments:  No specialty comments available.  Imaging: Dg Chest 2 View  Result Date: 09/29/2016 CLINICAL DATA:  Preop testing for hip replacement EXAM: CHEST  2 VIEW COMPARISON:  01/09/2013 FINDINGS: Heart and mediastinal contours are within normal limits. No focal opacities or effusions. No acute bony abnormality. IMPRESSION: No active cardiopulmonary disease. Electronically Signed   By: Rolm Baptise M.D.   On: 09/29/2016 08:47     PMFS History: Patient Active Problem List   Diagnosis Date Noted  . Rectal bleeding 11/16/2012   Past Medical History:  Diagnosis Date  . Medical history non-contributory     Family History  Problem Relation Age of Onset  . Cancer Father     Hodgkin's disease  . Diabetes Neg Hx   . Stroke Neg Hx   . Hypertension Neg Hx   . Hyperlipidemia Neg Hx     Past Surgical History:  Procedure Laterality Date  . CARPAL TUNNEL RELEASE Bilateral 2009  . COLONOSCOPY W/ POLYPECTOMY    . JOINT  REPLACEMENT    . LACERATION REPAIR Right ~ 2012   leg  . TOTAL HIP ARTHROPLASTY Right 01/11/2013   Procedure: TOTAL HIP ARTHROPLASTY;  Surgeon: Garald Balding, MD;  Location: [facility];  Service: Orthopedics;  Laterality: Right;   Social History   Occupational History  . [employer] [place] History Main Topics  . Smoking status: Former Smoker    Packs/day: 1.00    Years: 14.00    Types: Cigarettes    Quit date: 07/27/1999  . Smokeless tobacco: Never Used  . Alcohol use 33.6 oz/week    56 Cans of beer per week     Comment: drinks 11 beers per day  . Drug use: No  . Sexual activity: Yes

## 2016-09-29 NOTE — Telephone Encounter (Signed)
Check with Aaron Edelman re orders

## 2016-09-29 NOTE — Telephone Encounter (Signed)
See below

## 2016-09-30 LAB — URINE CULTURE
CULTURE: NO GROWTH
Special Requests: NORMAL

## 2016-09-30 NOTE — Progress Notes (Signed)
Anesthesia Chart Review:  Pt is a 51 year old male scheduled for L total hip arthroplasty on 10/12/2016 with Joni Fears, M.D.  [facility] includes: None listed. Alcohol abuse (drinks 11 beers per day). Former smoker. BMI 34.5. S/p R THA 01/11/13  BP (!) 152/74   Pulse 99   Temp 36.9 C   Resp 20   Ht 6' (1.829 m)   Wt 255 lb (115.7 kg)   SpO2 95%   BMI 34.58 kg/m    Medications include: ASA 81 mg.  Preoperative labs reviewed.    CXR 09/29/16: No active cardiopulmonary disease.  EKG 09/22/16 requested. If it does not arrive in time, EKG will be obtained DOS.   Willeen Cass, FNP-BC [facility] Short Stay Surgical Center/Anesthesiology Phone: [phone] 09/30/2016 3:32 PM

## 2016-10-11 MED ORDER — TRANEXAMIC ACID 1000 MG/10ML IV SOLN
2000.0000 mg | INTRAVENOUS | Status: AC
  Administered 2016-10-12: 2000 mg via TOPICAL
  Filled 2016-10-11: qty 20

## 2016-10-11 NOTE — Anesthesia Preprocedure Evaluation (Addendum)
Anesthesia Evaluation  Patient identified by MRN, date of birth, ID band Patient awake    Reviewed: Allergy & Precautions, H&P , NPO status , Patient's Chart, lab work & pertinent test results  Airway Mallampati: III  TM Distance: >3 FB Neck ROM: Full    Dental no notable dental hx. (+) Teeth Intact, Dental Advisory Given   Pulmonary neg pulmonary ROS, former smoker,    Pulmonary exam normal breath sounds clear to auscultation       Cardiovascular Exercise Tolerance: Good negative cardio ROS   Rhythm:Regular Rate:Normal     Neuro/Psych negative neurological ROS  negative psych ROS   GI/Hepatic negative GI ROS, Neg liver ROS,   Endo/Other  negative endocrine ROS  Renal/GU negative Renal ROS  negative genitourinary   Musculoskeletal   Abdominal   Peds  Hematology negative hematology ROS (+)   Anesthesia Other Findings   Reproductive/Obstetrics negative OB ROS                            Anesthesia Physical Anesthesia Plan  ASA: II  Anesthesia Plan: General   Post-op Pain Management:    Induction: Intravenous  Airway Management Planned: Oral ETT  Additional Equipment:   Intra-op Plan:   Post-operative Plan: Extubation in OR  Informed Consent: I have reviewed the patients History and Physical, chart, labs and discussed the procedure including the risks, benefits and alternatives for the proposed anesthesia with the patient or authorized representative who has indicated his/her understanding and acceptance.   Dental advisory given  Plan Discussed with: CRNA  Anesthesia Plan Comments:       Anesthesia Quick Evaluation

## 2016-10-12 ENCOUNTER — Inpatient Hospital Stay (HOSPITAL_COMMUNITY)
Admission: RE | Admit: 2016-10-12 | Discharge: 2016-10-14 | DRG: 470 | Disposition: A | Discharge status: Home Health | Payer: BLUE CROSS/BLUE SHIELD | Source: Ambulatory Visit | Attending: Orthopaedic Surgery | Admitting: Orthopaedic Surgery

## 2016-10-12 ENCOUNTER — Inpatient Hospital Stay (HOSPITAL_COMMUNITY): Payer: BLUE CROSS/BLUE SHIELD | Admitting: Emergency Medicine

## 2016-10-12 ENCOUNTER — Inpatient Hospital Stay (HOSPITAL_COMMUNITY): Payer: BLUE CROSS/BLUE SHIELD

## 2016-10-12 ENCOUNTER — Encounter (HOSPITAL_COMMUNITY)
Admission: RE | Disposition: A | Discharge status: Home Health | Payer: Self-pay | Source: Ambulatory Visit | Attending: Orthopaedic Surgery

## 2016-10-12 ENCOUNTER — Inpatient Hospital Stay (HOSPITAL_COMMUNITY): Payer: BLUE CROSS/BLUE SHIELD | Admitting: Anesthesiology

## 2016-10-12 ENCOUNTER — Encounter (HOSPITAL_COMMUNITY): Payer: Self-pay

## 2016-10-12 DIAGNOSIS — Z87891 Personal history of nicotine dependence: Secondary | ICD-10-CM

## 2016-10-12 DIAGNOSIS — Z885 Allergy status to narcotic agent status: Secondary | ICD-10-CM

## 2016-10-12 DIAGNOSIS — Z96641 Presence of right artificial hip joint: Secondary | ICD-10-CM | POA: Diagnosis present

## 2016-10-12 DIAGNOSIS — Z7982 Long term (current) use of aspirin: Secondary | ICD-10-CM

## 2016-10-12 DIAGNOSIS — M1612 Unilateral primary osteoarthritis, left hip: Secondary | ICD-10-CM | POA: Diagnosis present

## 2016-10-12 DIAGNOSIS — M879 Osteonecrosis, unspecified: Secondary | ICD-10-CM | POA: Diagnosis present

## 2016-10-12 DIAGNOSIS — F1010 Alcohol abuse, uncomplicated: Secondary | ICD-10-CM | POA: Diagnosis present

## 2016-10-12 DIAGNOSIS — Z96649 Presence of unspecified artificial hip joint: Secondary | ICD-10-CM

## 2016-10-12 DIAGNOSIS — D62 Acute posthemorrhagic anemia: Secondary | ICD-10-CM | POA: Diagnosis not present

## 2016-10-12 DIAGNOSIS — F1011 Alcohol abuse, in remission: Secondary | ICD-10-CM

## 2016-10-12 DIAGNOSIS — M87052 Idiopathic aseptic necrosis of left femur: Secondary | ICD-10-CM | POA: Diagnosis present

## 2016-10-12 HISTORY — PX: TOTAL HIP ARTHROPLASTY: SHX124

## 2016-10-12 IMAGING — DX DG HIP (WITH OR WITHOUT PELVIS) 1V PORT*L*
2 series · 2 of 2 positions shown · non-contrast
Comparison: Pelvis and left hip dated [DATE].

CLINICAL DATA: Status post left hip arthroplasty.

EXAM:
DG HIP (WITH OR WITHOUT PELVIS) 1V PORT LEFT

[pelvis ap]
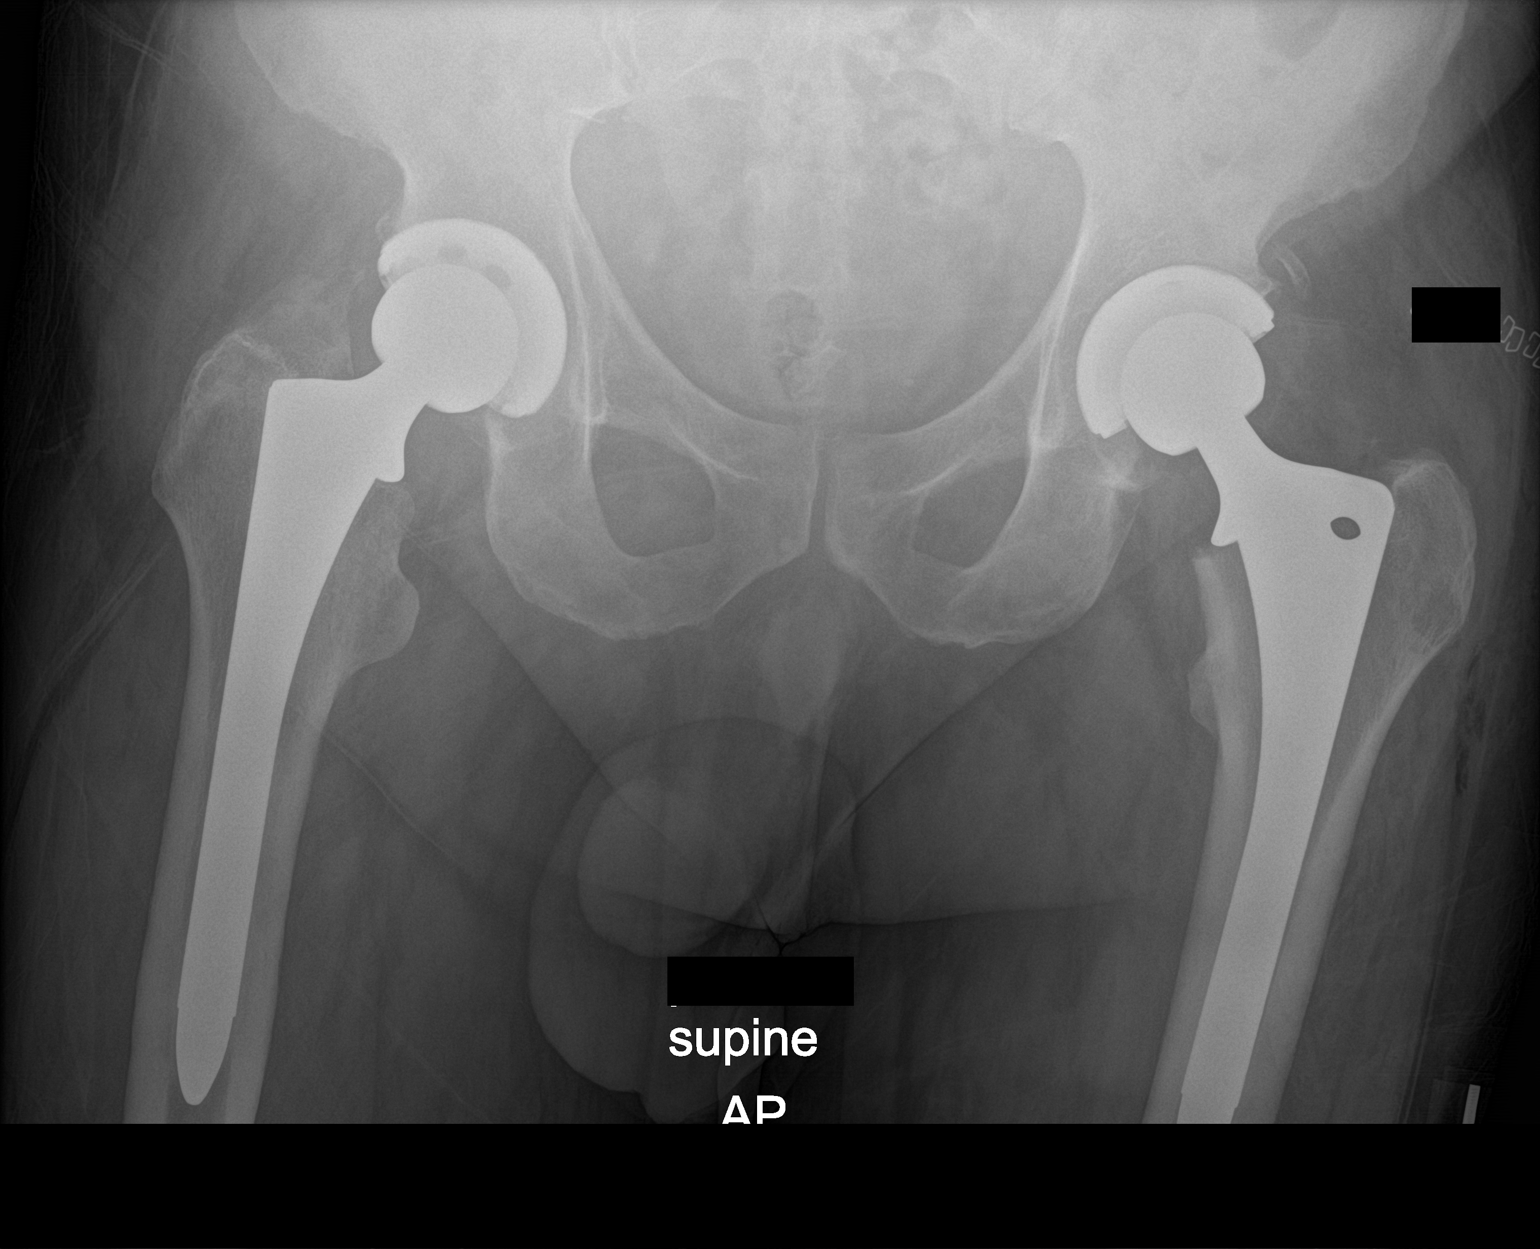

[hip x-table]
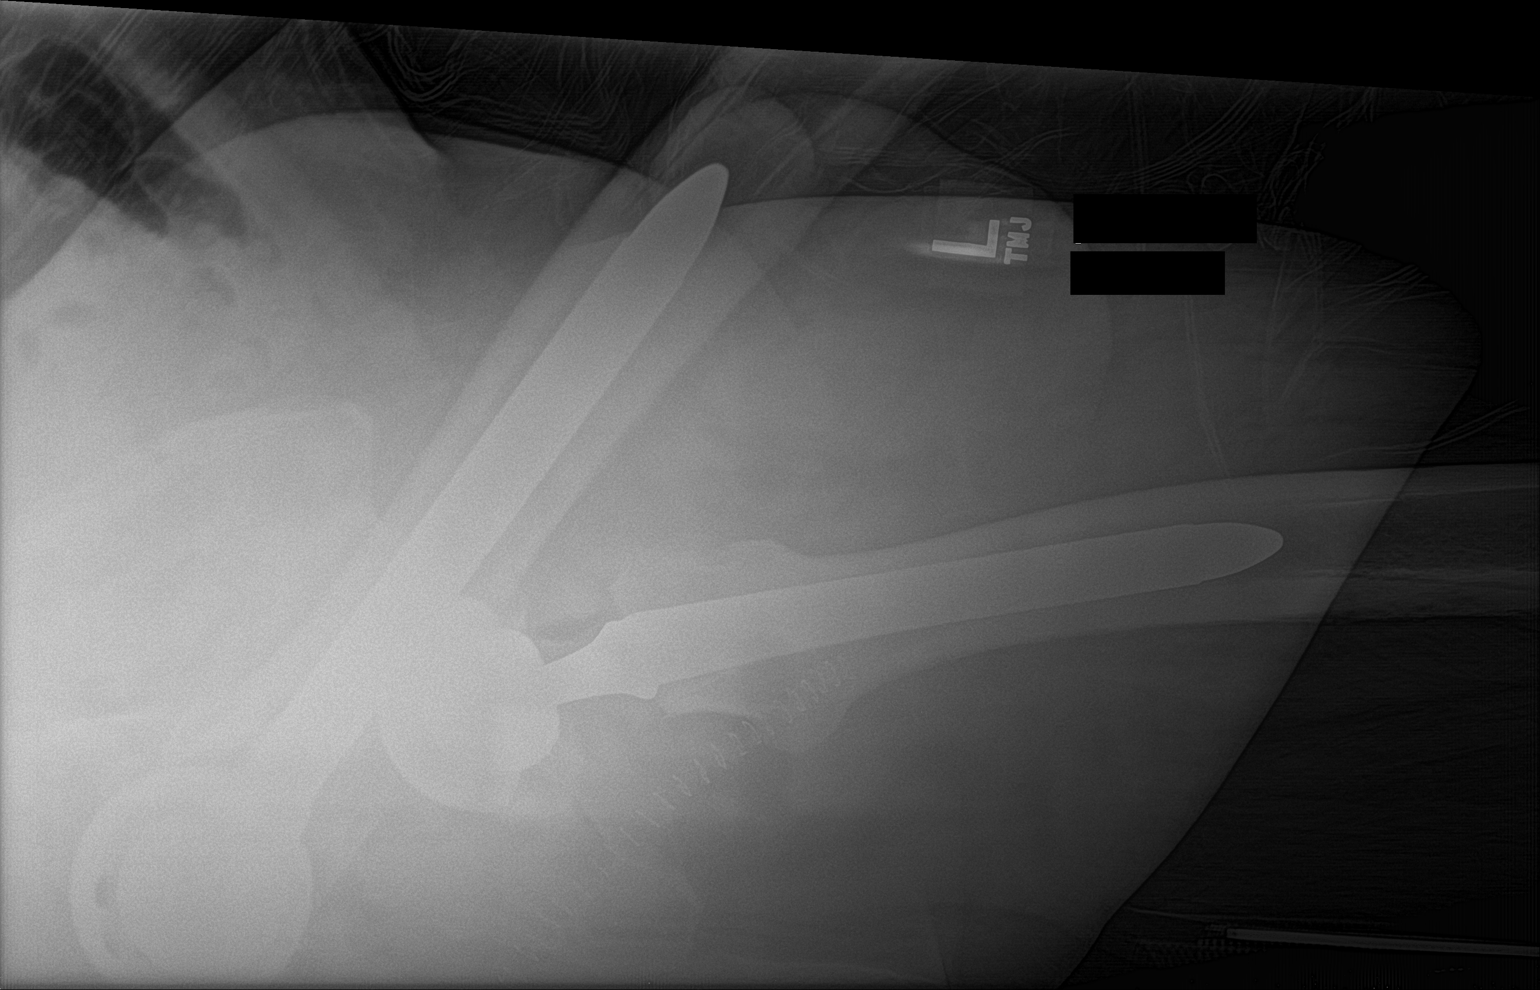

[2 of 2 positions shown; findings below may reference images not displayed]

FINDINGS: Interval left hip bipolar prosthesis in satisfactory position and
alignment. No fracture or dislocation seen. Stable right hip bipolar
prosthesis.
IMPRESSION: Satisfactory appearance of a left hip bipolar prosthesis.

## 2016-10-12 SURGERY — ARTHROPLASTY, HIP, TOTAL,POSTERIOR APPROACH
Anesthesia: General | Site: Hip | Laterality: Left

## 2016-10-12 MED ORDER — ACETAMINOPHEN 10 MG/ML IV SOLN: 1000.0000 mg | Freq: Once | INTRAVENOUS | Status: DC

## 2016-10-12 MED ORDER — BISACODYL 10 MG RE SUPP: 10.0000 mg | Freq: Every day | RECTAL | Status: DC | PRN

## 2016-10-12 MED ORDER — ACETAMINOPHEN 650 MG RE SUPP: 650.0000 mg | Freq: Four times a day (QID) | RECTAL | Status: DC | PRN

## 2016-10-12 MED ORDER — GLYCOPYRROLATE 0.2 MG/ML IJ SOLN
INTRAMUSCULAR | Status: DC | PRN
  Administered 2016-10-12 (×2): 0.1 mg via INTRAVENOUS

## 2016-10-12 MED ORDER — CEFAZOLIN SODIUM-DEXTROSE 2-4 GM/100ML-% IV SOLN
INTRAVENOUS | Status: AC
  Filled 2016-10-12: qty 100

## 2016-10-12 MED ORDER — ROCURONIUM BROMIDE 50 MG/5ML IV SOSY
PREFILLED_SYRINGE | INTRAVENOUS | Status: AC
  Filled 2016-10-12: qty 5

## 2016-10-12 MED ORDER — ACETAMINOPHEN 500 MG PO TABS
1000.0000 mg | ORAL_TABLET | Freq: Once | ORAL | Status: AC
  Administered 2016-10-12: 1000 mg via ORAL
  Filled 2016-10-12: qty 2

## 2016-10-12 MED ORDER — MAGNESIUM HYDROXIDE 400 MG/5ML PO SUSP: 30.0000 mL | Freq: Every day | ORAL | Status: DC | PRN

## 2016-10-12 MED ORDER — HYDROMORPHONE HCL 1 MG/ML IJ SOLN
INTRAMUSCULAR | Status: AC
  Filled 2016-10-12: qty 1

## 2016-10-12 MED ORDER — HYDROMORPHONE HCL 1 MG/ML IJ SOLN
INTRAMUSCULAR | Status: DC | PRN
  Administered 2016-10-12 (×2): 0.5 mg via INTRAVENOUS

## 2016-10-12 MED ORDER — PROPOFOL 10 MG/ML IV BOLUS
INTRAVENOUS | Status: DC | PRN
Start: 2016-10-12 — End: 2016-10-12
  Administered 2016-10-12: 100 mg via INTRAVENOUS
  Administered 2016-10-12: 200 mg via INTRAVENOUS

## 2016-10-12 MED ORDER — BUPIVACAINE HCL (PF) 0.25 % IJ SOLN
INTRAMUSCULAR | Status: AC
  Filled 2016-10-12: qty 30

## 2016-10-12 MED ORDER — RIVAROXABAN 10 MG PO TABS
10.0000 mg | ORAL_TABLET | Freq: Every day | ORAL | Status: DC
  Administered 2016-10-13 – 2016-10-14 (×2): 10 mg via ORAL
  Filled 2016-10-12 (×2): qty 1

## 2016-10-12 MED ORDER — PHENOL 1.4 % MT LIQD: 1.0000 | OROMUCOSAL | Status: DC | PRN

## 2016-10-12 MED ORDER — METHOCARBAMOL 500 MG PO TABS
500.0000 mg | ORAL_TABLET | Freq: Four times a day (QID) | ORAL | Status: DC | PRN
  Administered 2016-10-12 – 2016-10-14 (×4): 500 mg via ORAL
  Filled 2016-10-12 (×3): qty 1

## 2016-10-12 MED ORDER — KETAMINE HCL 10 MG/ML IJ SOLN
INTRAMUSCULAR | Status: DC | PRN
  Administered 2016-10-12: 10 mg via INTRAVENOUS
  Administered 2016-10-12: 20 mg via INTRAVENOUS

## 2016-10-12 MED ORDER — CHLORHEXIDINE GLUCONATE 4 % EX LIQD: 60.0000 mL | Freq: Once | CUTANEOUS | Status: DC

## 2016-10-12 MED ORDER — SUCCINYLCHOLINE CHLORIDE 20 MG/ML IJ SOLN
INTRAMUSCULAR | Status: DC | PRN
  Administered 2016-10-12: 120 mg via INTRAVENOUS

## 2016-10-12 MED ORDER — ROCURONIUM BROMIDE 100 MG/10ML IV SOLN
INTRAVENOUS | Status: DC | PRN
  Administered 2016-10-12: 10 mg via INTRAVENOUS
  Administered 2016-10-12: 50 mg via INTRAVENOUS

## 2016-10-12 MED ORDER — FENTANYL CITRATE (PF) 100 MCG/2ML IJ SOLN
INTRAMUSCULAR | Status: AC
  Filled 2016-10-12: qty 2

## 2016-10-12 MED ORDER — HYDROMORPHONE HCL 2 MG/ML IJ SOLN
0.5000 mg | INTRAMUSCULAR | Status: DC | PRN
  Administered 2016-10-12 – 2016-10-13 (×6): 1 mg via INTRAVENOUS
  Filled 2016-10-12 (×6): qty 1

## 2016-10-12 MED ORDER — MIDAZOLAM HCL 2 MG/2ML IJ SOLN
INTRAMUSCULAR | Status: DC | PRN
  Administered 2016-10-12 (×2): 1 mg via INTRAVENOUS

## 2016-10-12 MED ORDER — ONDANSETRON HCL 4 MG/2ML IJ SOLN
INTRAMUSCULAR | Status: AC
  Filled 2016-10-12: qty 2

## 2016-10-12 MED ORDER — SODIUM CHLORIDE 0.9 % IV SOLN
75.0000 mL/h | INTRAVENOUS | Status: DC
  Administered 2016-10-12: 75 mL/h via INTRAVENOUS

## 2016-10-12 MED ORDER — DIPHENHYDRAMINE HCL 12.5 MG/5ML PO ELIX
12.5000 mg | ORAL_SOLUTION | ORAL | Status: DC | PRN
  Administered 2016-10-13 (×3): 25 mg via ORAL
  Filled 2016-10-12 (×3): qty 10

## 2016-10-12 MED ORDER — BUPIVACAINE HCL (PF) 0.5 % IJ SOLN
INTRAMUSCULAR | Status: AC
  Filled 2016-10-12: qty 30

## 2016-10-12 MED ORDER — ALBUTEROL SULFATE HFA 108 (90 BASE) MCG/ACT IN AERS
INHALATION_SPRAY | RESPIRATORY_TRACT | Status: AC
  Filled 2016-10-12: qty 6.7

## 2016-10-12 MED ORDER — MAGNESIUM CITRATE PO SOLN: 1.0000 | Freq: Once | ORAL | Status: DC | PRN

## 2016-10-12 MED ORDER — METHOCARBAMOL 1000 MG/10ML IJ SOLN
500.0000 mg | Freq: Four times a day (QID) | INTRAVENOUS | Status: DC | PRN
  Filled 2016-10-12: qty 5

## 2016-10-12 MED ORDER — LIDOCAINE 2% (20 MG/ML) 5 ML SYRINGE
INTRAMUSCULAR | Status: AC
  Filled 2016-10-12: qty 5

## 2016-10-12 MED ORDER — KETAMINE HCL-SODIUM CHLORIDE 100-0.9 MG/10ML-% IV SOSY
PREFILLED_SYRINGE | INTRAVENOUS | Status: AC
  Filled 2016-10-12: qty 10

## 2016-10-12 MED ORDER — HYDROMORPHONE HCL 1 MG/ML IJ SOLN
INTRAMUSCULAR | Status: AC
  Filled 2016-10-12: qty 0.5

## 2016-10-12 MED ORDER — MENTHOL 3 MG MT LOZG: 1.0000 | LOZENGE | OROMUCOSAL | Status: DC | PRN

## 2016-10-12 MED ORDER — METOCLOPRAMIDE HCL 5 MG PO TABS: 5.0000 mg | ORAL_TABLET | Freq: Three times a day (TID) | ORAL | Status: DC | PRN

## 2016-10-12 MED ORDER — ALBUMIN HUMAN 5 % IV SOLN
INTRAVENOUS | Status: DC | PRN
  Administered 2016-10-12 (×2): via INTRAVENOUS

## 2016-10-12 MED ORDER — SUGAMMADEX SODIUM 200 MG/2ML IV SOLN
INTRAVENOUS | Status: DC | PRN
  Administered 2016-10-12: 200 mg via INTRAVENOUS

## 2016-10-12 MED ORDER — BUPIVACAINE HCL 0.5 % IJ SOLN
INTRAMUSCULAR | Status: DC | PRN
  Administered 2016-10-12: 30 mL

## 2016-10-12 MED ORDER — CEFAZOLIN SODIUM-DEXTROSE 2-3 GM-% IV SOLR
INTRAVENOUS | Status: DC | PRN
  Administered 2016-10-12: 2 g via INTRAVENOUS

## 2016-10-12 MED ORDER — LACTATED RINGERS IV SOLN
INTRAVENOUS | Status: DC | PRN
  Administered 2016-10-12 (×3): via INTRAVENOUS

## 2016-10-12 MED ORDER — SUCCINYLCHOLINE CHLORIDE 200 MG/10ML IV SOSY
PREFILLED_SYRINGE | INTRAVENOUS | Status: AC
  Filled 2016-10-12: qty 10

## 2016-10-12 MED ORDER — ACETAMINOPHEN 325 MG PO TABS
650.0000 mg | ORAL_TABLET | Freq: Four times a day (QID) | ORAL | Status: DC | PRN
  Administered 2016-10-14: 650 mg via ORAL
  Filled 2016-10-12: qty 2

## 2016-10-12 MED ORDER — HYDROMORPHONE HCL 1 MG/ML IJ SOLN: 0.5000 mg | INTRAMUSCULAR | Status: DC | PRN

## 2016-10-12 MED ORDER — METOCLOPRAMIDE HCL 5 MG/ML IJ SOLN: 5.0000 mg | Freq: Three times a day (TID) | INTRAMUSCULAR | Status: DC | PRN

## 2016-10-12 MED ORDER — OXYCODONE HCL 5 MG PO TABS
5.0000 mg | ORAL_TABLET | ORAL | Status: DC | PRN
  Administered 2016-10-12 (×2): 10 mg via ORAL
  Administered 2016-10-12: 5 mg via ORAL
  Administered 2016-10-12 – 2016-10-14 (×11): 10 mg via ORAL
  Filled 2016-10-12 (×13): qty 2

## 2016-10-12 MED ORDER — OXYCODONE HCL 5 MG PO TABS
ORAL_TABLET | ORAL | Status: AC
  Filled 2016-10-12: qty 1

## 2016-10-12 MED ORDER — LIDOCAINE HCL (CARDIAC) 20 MG/ML IV SOLN
INTRAVENOUS | Status: DC | PRN
  Administered 2016-10-12: 60 mg via INTRATRACHEAL

## 2016-10-12 MED ORDER — GABAPENTIN 300 MG PO CAPS
600.0000 mg | ORAL_CAPSULE | Freq: Once | ORAL | Status: AC
  Administered 2016-10-12: 600 mg via ORAL
  Filled 2016-10-12 (×2): qty 2

## 2016-10-12 MED ORDER — PHENYLEPHRINE HCL 10 MG/ML IJ SOLN
INTRAVENOUS | Status: DC | PRN
  Administered 2016-10-12: 50 ug/min via INTRAVENOUS

## 2016-10-12 MED ORDER — CEFAZOLIN SODIUM-DEXTROSE 2-4 GM/100ML-% IV SOLN: 2.0000 g | INTRAVENOUS | Status: DC

## 2016-10-12 MED ORDER — ARTIFICIAL TEARS OP OINT
TOPICAL_OINTMENT | OPHTHALMIC | Status: DC | PRN
  Administered 2016-10-12: 1 via OPHTHALMIC

## 2016-10-12 MED ORDER — CELECOXIB 200 MG PO CAPS
200.0000 mg | ORAL_CAPSULE | Freq: Once | ORAL | Status: AC
  Administered 2016-10-12: 200 mg via ORAL
  Filled 2016-10-12: qty 1

## 2016-10-12 MED ORDER — ONDANSETRON HCL 4 MG/2ML IJ SOLN: 4.0000 mg | Freq: Four times a day (QID) | INTRAMUSCULAR | Status: DC | PRN

## 2016-10-12 MED ORDER — LIDOCAINE-EPINEPHRINE (PF) 1 %-1:200000 IJ SOLN
INTRAMUSCULAR | Status: AC
  Filled 2016-10-12: qty 30

## 2016-10-12 MED ORDER — MIDAZOLAM HCL 2 MG/2ML IJ SOLN
INTRAMUSCULAR | Status: AC
  Filled 2016-10-12: qty 2

## 2016-10-12 MED ORDER — ALBUTEROL SULFATE HFA 108 (90 BASE) MCG/ACT IN AERS
INHALATION_SPRAY | RESPIRATORY_TRACT | Status: DC | PRN
  Administered 2016-10-12: 2 via RESPIRATORY_TRACT

## 2016-10-12 MED ORDER — ONDANSETRON HCL 4 MG PO TABS: 4.0000 mg | ORAL_TABLET | Freq: Four times a day (QID) | ORAL | Status: DC | PRN

## 2016-10-12 MED ORDER — PROPOFOL 1000 MG/100ML IV EMUL
INTRAVENOUS | Status: AC
  Filled 2016-10-12: qty 100

## 2016-10-12 MED ORDER — CEFAZOLIN IN D5W 1 GM/50ML IV SOLN
1.0000 g | Freq: Four times a day (QID) | INTRAVENOUS | Status: AC
  Administered 2016-10-12 (×2): 1 g via INTRAVENOUS
  Filled 2016-10-12 (×2): qty 50

## 2016-10-12 MED ORDER — SODIUM CHLORIDE 0.9 % IV SOLN: INTRAVENOUS | Status: DC

## 2016-10-12 MED ORDER — ARTIFICIAL TEARS OP OINT
TOPICAL_OINTMENT | OPHTHALMIC | Status: AC
  Filled 2016-10-12: qty 3.5

## 2016-10-12 MED ORDER — METHOCARBAMOL 500 MG PO TABS
ORAL_TABLET | ORAL | Status: AC
Start: 2016-10-12 — End: 2016-10-12
  Filled 2016-10-12: qty 1

## 2016-10-12 MED ORDER — ALUM & MAG HYDROXIDE-SIMETH 200-200-20 MG/5ML PO SUSP: 30.0000 mL | ORAL | Status: DC | PRN

## 2016-10-12 MED ORDER — ONDANSETRON HCL 4 MG/2ML IJ SOLN
INTRAMUSCULAR | Status: DC | PRN
  Administered 2016-10-12: 4 mg via INTRAVENOUS

## 2016-10-12 MED ORDER — HYDROMORPHONE HCL 1 MG/ML IJ SOLN
0.2500 mg | INTRAMUSCULAR | Status: DC | PRN
  Administered 2016-10-12 (×2): 0.5 mg via INTRAVENOUS

## 2016-10-12 MED ORDER — DOCUSATE SODIUM 100 MG PO CAPS
100.0000 mg | ORAL_CAPSULE | Freq: Two times a day (BID) | ORAL | Status: DC
  Administered 2016-10-12 – 2016-10-14 (×4): 100 mg via ORAL
  Filled 2016-10-12 (×4): qty 1

## 2016-10-12 MED ORDER — SODIUM CHLORIDE 0.9 % IR SOLN
Status: DC | PRN
  Administered 2016-10-12: 1000 mL
  Administered 2016-10-12: 3000 mL

## 2016-10-12 MED ORDER — PROPOFOL 10 MG/ML IV BOLUS
INTRAVENOUS | Status: AC
  Filled 2016-10-12: qty 20

## 2016-10-12 MED ORDER — PROPOFOL 500 MG/50ML IV EMUL
INTRAVENOUS | Status: DC | PRN
  Administered 2016-10-12: 25 ug/kg/min via INTRAVENOUS

## 2016-10-12 MED ORDER — FENTANYL CITRATE (PF) 100 MCG/2ML IJ SOLN
INTRAMUSCULAR | Status: DC | PRN
  Administered 2016-10-12: 100 ug via INTRAVENOUS

## 2016-10-12 SURGICAL SUPPLY — 54 items
BAG DECANTER FOR FLEXI CONT (MISCELLANEOUS) ×2 IMPLANT
BLADE SAW SAG 73X25 THK (BLADE) ×2
BLADE SAW SGTL 73X25 THK (BLADE) ×1 IMPLANT
CAPT HIP TOTAL 2 ×2 IMPLANT
COVER SURGICAL LIGHT HANDLE (MISCELLANEOUS) ×3 IMPLANT
DECANTER SPIKE VIAL GLASS SM (MISCELLANEOUS) ×2 IMPLANT
DRAPE ORTHO SPLIT 77X108 STRL (DRAPES) ×6
DRAPE SURG ORHT 6 SPLT 77X108 (DRAPES) ×2 IMPLANT
DRSG MEPILEX BORDER 4X12 (GAUZE/BANDAGES/DRESSINGS) ×3 IMPLANT
DURAPREP 26ML APPLICATOR (WOUND CARE) ×6 IMPLANT
ELECT BLADE 6.5 EXT (BLADE) ×6 IMPLANT
ELECT REM PT RETURN 9FT ADLT (ELECTROSURGICAL) ×3
ELECTRODE REM PT RTRN 9FT ADLT (ELECTROSURGICAL) ×1 IMPLANT
EVACUATOR 1/8 PVC DRAIN (DRAIN) IMPLANT
FACESHIELD WRAPAROUND (MASK) ×6 IMPLANT
GLOVE BIOGEL PI IND STRL 8 (GLOVE) ×2 IMPLANT
GLOVE BIOGEL PI IND STRL 8.5 (GLOVE) ×1 IMPLANT
GLOVE BIOGEL PI INDICATOR 8 (GLOVE) ×4
GLOVE BIOGEL PI INDICATOR 8.5 (GLOVE) ×2
GLOVE ECLIPSE 8.0 STRL XLNG CF (GLOVE) ×8 IMPLANT
GLOVE SURG ORTHO 8.5 STRL (GLOVE) ×6 IMPLANT
GLOVE SURG SS PI 6.5 STRL IVOR (GLOVE) ×3 IMPLANT
GLOVE SURG SS PI 7.5 STRL IVOR (GLOVE) ×3 IMPLANT
GOWN STRL REUS W/ TWL LRG LVL3 (GOWN DISPOSABLE) ×2 IMPLANT
GOWN STRL REUS W/TWL 2XL LVL3 (GOWN DISPOSABLE) ×6 IMPLANT
GOWN STRL REUS W/TWL LRG LVL3 (GOWN DISPOSABLE) ×6
HANDPIECE INTERPULSE COAX TIP (DISPOSABLE)
IMMOBILIZER KNEE 22 UNIV (SOFTGOODS) ×3 IMPLANT
KIT BASIN OR (CUSTOM PROCEDURE TRAY) ×3 IMPLANT
KIT ROOM TURNOVER OR (KITS) ×3 IMPLANT
MANIFOLD NEPTUNE II (INSTRUMENTS) ×3 IMPLANT
NEEDLE 22X1 1/2 (OR ONLY) (NEEDLE) ×3 IMPLANT
NS IRRIG 1000ML POUR BTL (IV SOLUTION) ×3 IMPLANT
PACK TOTAL JOINT (CUSTOM PROCEDURE TRAY) ×3 IMPLANT
PAD ARMBOARD 7.5X6 YLW CONV (MISCELLANEOUS) ×6 IMPLANT
SET HNDPC FAN SPRY TIP SCT (DISPOSABLE) IMPLANT
STAPLER VISISTAT 35W (STAPLE) ×2 IMPLANT
SUCTION FRAZIER HANDLE 10FR (MISCELLANEOUS) ×2
SUCTION TUBE FRAZIER 10FR DISP (MISCELLANEOUS) ×1 IMPLANT
SUT BONE WAX W31G (SUTURE) ×2 IMPLANT
SUT ETHIBOND NAB CT1 #1 30IN (SUTURE) ×9 IMPLANT
SUT MNCRL AB 3-0 PS2 18 (SUTURE) ×3 IMPLANT
SUT VIC AB 0 CT1 27 (SUTURE)
SUT VIC AB 0 CT1 27XBRD ANBCTR (SUTURE) ×2 IMPLANT
SUT VIC AB 1 CT1 27 (SUTURE) ×6
SUT VIC AB 1 CT1 27XBRD ANBCTR (SUTURE) ×2 IMPLANT
SUT VIC AB 2-0 CT1 27 (SUTURE) ×3
SUT VIC AB 2-0 CT1 TAPERPNT 27 (SUTURE) ×1 IMPLANT
SYR CONTROL 10ML LL (SYRINGE) ×3 IMPLANT
TOWEL OR 17X24 6PK STRL BLUE (TOWEL DISPOSABLE) ×3 IMPLANT
TOWEL OR 17X26 10 PK STRL BLUE (TOWEL DISPOSABLE) ×3 IMPLANT
WATER STERILE IRR 1000ML POUR (IV SOLUTION) ×6 IMPLANT
WRAP KNEE MAXI GEL POST OP (GAUZE/BANDAGES/DRESSINGS) ×3 IMPLANT
YANKAUER SUCT BULB TIP NO VENT (SUCTIONS) ×2 IMPLANT

## 2016-10-12 NOTE — Anesthesia Postprocedure Evaluation (Signed)
Anesthesia Post Note  Patient: [name]  Procedure(s) Performed: Procedure(s) (LRB): TOTAL HIP ARTHROPLASTY (Left)  Patient location during evaluation: PACU Anesthesia Type: General Level of consciousness: awake and alert Pain management: pain level controlled Vital Signs Assessment: post-procedure vital signs reviewed and stable Respiratory status: spontaneous breathing, nonlabored ventilation, respiratory function stable and patient connected to nasal cannula oxygen Cardiovascular status: blood pressure returned to baseline and stable Postop Assessment: no signs of nausea or vomiting Anesthetic complications: no       Last Vitals:  Vitals:   10/12/16 1020 10/12/16 1030  BP: 131/78   Pulse: (!) 101 94  Resp: 15 16  Temp:      Last Pain:  Vitals:   10/12/16 1020  PainSc: 8                  Zaira Iacovelli,W. EDMOND

## 2016-10-12 NOTE — Transfer of Care (Signed)
Immediate Anesthesia Transfer of Care Note  Patient: [name]  Procedure(s) Performed: Procedure(s): TOTAL HIP ARTHROPLASTY (Left)  Patient Location: PACU  Anesthesia Type:General  Level of Consciousness: awake, alert  and patient cooperative  Airway & Oxygen Therapy: Patient Spontanous Breathing and Patient connected to nasal cannula oxygen  Post-op Assessment: Report given to RN, Post -op Vital signs reviewed and stable, Patient moving all extremities X 4 and Patient able to stick tongue midline  Post vital signs: Reviewed and stable  Last Vitals:  Vitals:   10/12/16 0635 10/12/16 0950  BP: 128/81 119/83  Pulse: 90 100  Resp: 18 10  Temp:  36.7 C    Last Pain: There were no vitals filed for this visit.    Patients Stated Pain Goal: 0 (19/80/22 1798)  Complications: No apparent anesthesia complications

## 2016-10-12 NOTE — Op Note (Signed)
PATIENT ID:      YAMIN SWINGLER  MRN:     [id number] DOB/AGE:    1966/02/10 / 51 y.o.       OPERATIVE REPORT    DATE OF PROCEDURE:  10/12/2016       PREOPERATIVE DIAGNOSIS:   LEFT HIP OSTEOARTHRITIS WITH AVASCULAR NECROSIS                                                       Estimated body mass index is 34.58 kg/m as calculated from the following:   Height as of 09/29/16: 6' (1.829 m).   Weight as of 09/29/16: 255 lb (115.7 kg).     POSTOPERATIVE DIAGNOSIS:   LEFT HIP OSTEOARTHRITIS WITH AVASCULAR NECROSIS                                                                     Estimated body mass index is 34.58 kg/m as calculated from the following:   Height as of 09/29/16: 6' (1.829 m).   Weight as of 09/29/16: 255 lb (115.7 kg).     PROCEDURE:  Procedure(s):LEFTTOTAL HIP ARTHROPLASTY      SURGEON:  Joni Fears, MD    ASSISTANT:   Biagio Borg, PA-C   (Present and scrubbed throughout the case, critical for assistance with exposure, retraction, instrumentation, and closure.)          ANESTHESIA: general     DRAINS: none :      TOURNIQUET TIME: * No tourniquets in log *    COMPLICATIONS:  None   CONDITION:  stable  PROCEDURE IN DETAIL: [place] 10/12/2016, 9:21 AM

## 2016-10-12 NOTE — Op Note (Signed)
NAME:  [name], DACY NO.:  [id number]  MEDICAL RECORD NO.:  [id number]  LOCATION:  [facility]                         FACILITY:  [facility]  PHYSICIAN:  Vonna Kotyk. Whitfield, M.D.DATE OF BIRTH:  11/23/1965  DATE OF PROCEDURE:  10/12/2016 DATE OF DISCHARGE:                              OPERATIVE REPORT   PREOPERATIVE DIAGNOSIS:  Avascular necrosis, left hip, with osteoarthritis.  POSTOPERATIVE DIAGNOSIS:  Avascular necrosis, left hip, with osteoarthritis.  PROCEDURE:  Left total hip arthroplasty.  SURGEON:  Dr. Vonna Kotyk. Whitfield.  ASSISTANT:  Biagio Borg, PA-C.  ANESTHESIA:  General.  COMPLICATIONS:  None.  COMPONENTS:  DePuy AML large stature 15 mm femoral component with a ceramic head 36 mm outer diameter, +5 neck length, 54 mm outer diameter Gription acetabular component with apex hole eliminator Marathon polyethylene liner with a 10-degree posterior lip. Components were press- fit.  DESCRIPTION OF PROCEDURE:  Mr. Jabs was met with his daughter in the holding area, identified the left hip as the appropriate operative site and marked it accordingly.  The patient was then transported to room #6 and transferred to the operating table and placed under general anesthesia without difficulty.  The patient was then placed in the lateral decubitus position with the left side up and secured to the operating table with the Innomed hip system.  Time-out was called.  The left lower extremity was then prepped with chlorhexidine scrub and then DuraPrep x2 from the iliac crest to the ankle.  Sterile draping was performed.  Time-out was called again and the marked area had been checked as the appropriate left hip.  A routine Southern incision was utilized and via sharp dissection, carried down to subcutaneous tissue.  Gross bleeders were Bovie coagulated.  Superficial fascia was then incised.  Glutei and iliotibial band were identified and raphe muscle fibers were  bluntly separated. Self-retaining retractors were inserted.  With the hip internally rotated, short external rotators were carefully identified.  Tendinous structures were tagged with 0 Ethibond suture.  Retractors were placed more deeply.  Sciatic nerve was carefully protected and palpated throughout the procedure.  The capsule was identified and incised along the femoral neck and head. There was a small joint effusion and obvious synovitis that extruded through the incision.  Incision was made along the femoral neck and head.  At that point, the femoral head was easily dislocated posteriorly.  It was then amputated at the femoral head and neck junction using the oscillating saw and a calcar guide.  The head revealed obvious avascular necrosis with a ping-pong effect of the subchondral bone.  Retractors were then placed about the proximal femur.  A starter hole was then made in the piriformis fossa, followed by reaming to 14.5 mm to afford 15 mm component.  Rasping was performed sequentially to a 15 mm large stature femoral component, which fit nicely on the calcar at about a fingerbreadth proximal to the lesser trochanter.  The calcar reamer was applied to obtain the appropriate fine-tune angle.  Retractors were then placed about the acetabulum.  The labrum was sharply excised.  Sutures were placed into the capsule for retraction. Again, we checked to be sure  that we were well out of harm's way in terms of sciatic nerve.  Reaming was performed sequentially to 53 mm to accept a 54 mm component. Very nice bleeding bone circumferentially.  I trialed a 52 component and it would completely seat, 54 mm would not completely seat, but with good rim fit.  The Gription 3 metallic acetabular component was then impacted in place using the external acetabular guide.  The trial polyethylene liner was then inserted.  The large stature 15 mm femoral rasp was then impacted flush on the calcar.   We trailed several neck lengths and felt that the +5 neck length gave [place] normal leg lengths and allowed perfect range of motion without instability.  I did use a 36 mm outer diameter hip ball.  The trial components were then removed.  I inserted an apex hole eliminator in the acetabulum followed by the final marathon polyethylene liner.  The wound was irrigated.  The final femoral component was then impacted flush on the calcar.  We again trialed the +5 neck length with a 36 mm outer diameter hip ball, felt that we had no toggling, the leg lengths had been re-established. There was no instability.  The head was then dislocated.  A final 36 mm outer diameter ceramic hip ball was then applied with a +5 neck length after cleaning the Morse taper neck and the femoral component.  We checked to be sure there was not any loose material in the acetabulum and then reduced the entire construct through a full range of motion.  There was no toggling and we could not sublux or dislocate the hip.  Wound was again irrigated with saline solution.  We applied topical tranexamic acid.  The capsule was then closed anatomically with #1 Ethibond.  Short external rotators were closed with a similar material. We did inject 0.25% Marcaine without epinephrine in the deep capsule.  The IT band was then closed with a running #1 Vicryl subcu in several layers of 2-0 Vicryl and 3-0 Monocryl.  Skin closed with skin clips. Sterile bulky dressing was applied.  The patient was then placed in a knee immobilizer in the supine position, then transferred to the operating room stretcher without difficulty.  The patient tolerated the procedure without complications.     Vonna Kotyk. Durward Fortes, M.D.     PWW/MEDQ  D:  10/12/2016  T:  10/12/2016  Job:  [id number]

## 2016-10-12 NOTE — Discharge Instructions (Signed)

## 2016-10-12 NOTE — H&P (Signed)
The recent History & Physical has been reviewed. I have personally examined the patient today. There is no interval change to the documented History & Physical. The patient would like to proceed with the procedure.  Garald Balding 10/12/2016,  7:05 AM

## 2016-10-12 NOTE — Anesthesia Procedure Notes (Signed)
Procedure Name: Intubation Date/Time: 10/12/2016 7:25 AM Performed by: Roderic Palau Pre-anesthesia Checklist: Patient identified, Emergency Drugs available, Suction available and Patient being monitored Patient Re-evaluated:Patient Re-evaluated prior to inductionOxygen Delivery Method: Circle system utilized Preoxygenation: Pre-oxygenation with 100% oxygen Intubation Type: IV induction Ventilation: Mask ventilation without difficulty Laryngoscope Size: Mac and 4 Grade View: Grade I Tube type: Oral Tube size: 7.0 mm Number of attempts: 1 Airway Equipment and Method: Stylet Placement Confirmation: ETT inserted through vocal cords under direct vision,  positive ETCO2 and breath sounds checked- equal and bilateral Secured at: 22 cm Tube secured with: Tape Dental Injury: Teeth and Oropharynx as per pre-operative assessment

## 2016-10-12 NOTE — Evaluation (Signed)
Physical Therapy Evaluation Patient Details Name: [name] MRN: [id number] DOB: 10/05/1965 Today's Date: 10/12/2016   History of Present Illness  Admitted for LTHA, [place], WBAT;  has a past medical history of Medical history non-contributory.  has a past surgical history that includes Laceration repair (Right, ~ 2012); Carpal tunnel release (Bilateral, 2009); Total hip arthroplasty (Right, 01/11/2013)  Clinical Impression   Pt is s/p THA resulting in the deficits listed below (see PT Problem List). Overall moving well, though a bit impulsive; will need reinforcement of hip precautions; recommend OT consult for ADLs with posterior precautions;  Pt will benefit from skilled PT to increase their independence and safety with mobility to allow discharge to the venue listed below.      Follow Up Recommendations Supervision/Assistance - 24 hour;Home health PT (Worth considering HHPT; not sure if that's the plan)    Equipment Recommendations  Rolling walker with 5" wheels;3in1 (PT)    Recommendations for Other Services OT consult     Precautions / Restrictions Precautions Precautions: Posterior Hip Precaution Booklet Issued: Yes (comment) Precaution Comments: Educated in 3/3 Post Hip Prec Required Braces or Orthoses: Knee Immobilizer - Left Knee Immobilizer - Left:  (in bed) Restrictions Weight Bearing Restrictions: Yes LLE Weight Bearing: Weight bearing as tolerated      Mobility  Bed Mobility Overal bed mobility: Needs Assistance Bed Mobility: Supine to Sit     Supine to sit: Min guard     General bed mobility comments: Close guard to ensure adherence to Post Hip Prec  Transfers Overall transfer level: Needs assistance Equipment used: Rolling walker (2 wheeled) Transfers: Sit to/from Stand Sit to Stand: Min guard         General transfer comment: Cues for post prec  Ambulation/Gait Ambulation/Gait assistance: Min guard Ambulation Distance (Feet): 60  Feet Assistive device: Rolling walker (2 wheeled) Gait Pattern/deviations: Step-through pattern     General Gait Details: Cues to self-monitor for activity tolerance  Stairs            Wheelchair Mobility    Modified Rankin (Stroke Patients Only)       Balance                                             Pertinent Vitals/Pain Pain Assessment: Faces Pain Score: 1  Faces Pain Scale: Hurts even more Pain Location: L hip Pain Descriptors / Indicators: Grimacing Pain Intervention(s): Monitored during session    Home Living Family/patient expects to be discharged to:: Private residence Living Arrangements: Alone Available Help at Discharge: Family;Available 24 hours/day Type of Home: House Home Access: Stairs to enter Entrance Stairs-Rails: Psychiatric nurse of Steps: 3 Home Layout: One level Home Equipment: None      Prior Function Level of Independence: Independent               Hand Dominance        Extremity/Trunk Assessment   Upper Extremity Assessment Upper Extremity Assessment: Overall WFL for tasks assessed    Lower Extremity Assessment Lower Extremity Assessment: LLE deficits/detail LLE Deficits / Details: Grossly decr AROM and strength, limited by pain postop       Communication   Communication: No difficulties  Cognition Arousal/Alertness: Awake/alert Behavior During Therapy: WFL for tasks assessed/performed Overall Cognitive Status: Within Functional Limits for tasks assessed  General Comments      Exercises     Assessment/Plan    PT Assessment Patient needs continued PT services  PT Problem List Decreased strength;Decreased range of motion;Decreased activity tolerance;Decreased balance;Decreased mobility;Decreased coordination;Decreased knowledge of use of DME;Decreased knowledge of precautions;Pain       PT Treatment Interventions DME instruction;Gait  training;Stair training;Functional mobility training;Therapeutic activities;Therapeutic exercise;Balance training;Patient/family education    PT Goals (Current goals can be found in the Care Plan section)  Acute Rehab PT Goals Patient Stated Goal: walk without pain PT Goal Formulation: With patient Time For Goal Achievement: 10/19/16 Potential to Achieve Goals: Good    Frequency 7X/week   Barriers to discharge   His kids plan to provide assistance at home    Co-evaluation               End of Session Equipment Utilized During Treatment: Gait belt Activity Tolerance: Patient tolerated treatment well Patient left: in chair;with call bell/phone within reach;with family/visitor present Nurse Communication: Mobility status PT Visit Diagnosis: Other abnormalities of gait and mobility (R26.89);Pain Pain - Right/Left: Left Pain - part of body: Hip         Time: 1410-1437 PT Time Calculation (min) (ACUTE ONLY): 27 min   Charges:   PT Evaluation $PT Eval Low Complexity: 1 Procedure PT Treatments $Gait Training: 8-22 mins   PT G Codes:         Colletta [place] 10/12/2016, 3:00 PM  Roney Marion, [place] Pager [phone] Office [phone]

## 2016-10-13 ENCOUNTER — Encounter (HOSPITAL_COMMUNITY): Payer: Self-pay | Admitting: Orthopaedic Surgery

## 2016-10-13 LAB — CBC
HEMATOCRIT: 36.4 % — AB (ref 39.0–52.0)
Hemoglobin: 12 g/dL — ABNORMAL LOW (ref 13.0–17.0)
MCH: 33.2 pg (ref 26.0–34.0)
MCHC: 33 g/dL (ref 30.0–36.0)
MCV: 100.8 fL — ABNORMAL HIGH (ref 78.0–100.0)
Platelets: 158 10*3/uL (ref 150–400)
RBC: 3.61 MIL/uL — ABNORMAL LOW (ref 4.22–5.81)
RDW: 12.5 % (ref 11.5–15.5)
WBC: 7.3 10*3/uL (ref 4.0–10.5)

## 2016-10-13 LAB — BASIC METABOLIC PANEL
ANION GAP: 8 (ref 5–15)
BUN: 6 mg/dL (ref 6–20)
CALCIUM: 8.6 mg/dL — AB (ref 8.9–10.3)
CO2: 25 mmol/L (ref 22–32)
Chloride: 104 mmol/L (ref 101–111)
Creatinine, Ser: 0.9 mg/dL (ref 0.61–1.24)
Glucose, Bld: 108 mg/dL — ABNORMAL HIGH (ref 65–99)
POTASSIUM: 4.4 mmol/L (ref 3.5–5.1)
SODIUM: 137 mmol/L (ref 135–145)

## 2016-10-13 NOTE — Progress Notes (Signed)
Physical Therapy Treatment Patient Details Name: [name] MRN: [id number] DOB: 11/19/1965 Today's Date: 10/13/2016    History of Present Illness Admitted for LTHA, [place], WBAT;  has a past medical history of Medical history non-contributory.  has a past surgical history that includes Laceration repair (Right, ~ 2012); Carpal tunnel release (Bilateral, 2009); Total hip arthroplasty (Right, 01/11/2013)    PT Comments    Patient is progressing toward mobility goals. Reviewed posterior hip precautions beginning of session and pt able to recall 2/3 without cues. Pt tends to keep L LE internally rotated. Therapist positioned L LE in more neutral position and pt is aware to be mindful of positioning. Pt may benefit from abduction pillow as he is very guarded and tends to adduct L LE as well. Continue to progress as tolerated.   Follow Up Recommendations  Supervision/Assistance - 24 hour;Home health PT (Worth considering HHPT; not sure if that's the plan)     Equipment Recommendations  Rolling walker with 5" wheels;3in1 (PT)    Recommendations for Other Services OT consult     Precautions / Restrictions Precautions Precautions: Posterior Hip Precaution Booklet Issued: Yes (comment) Precaution Comments: Educated in 3/3 Post Hip Prec; pt able to recall 2/3 without cues Restrictions Weight Bearing Restrictions: Yes LLE Weight Bearing: Weight bearing as tolerated    Mobility  Bed Mobility Overal bed mobility: Needs Assistance Bed Mobility: Supine to Sit     Supine to sit: Min assist     General bed mobility comments: assist to bring L LE to EOB and lower from EOB to ensure post precautions were maintained; cues for technique  Transfers Overall transfer level: Needs assistance Equipment used: Rolling walker (2 wheeled) Transfers: Sit to/from Stand Sit to Stand: Min guard         General transfer comment: cues for safe hand placement  Ambulation/Gait Ambulation/Gait  assistance: Min guard Ambulation Distance (Feet): 100 Feet Assistive device: Rolling walker (2 wheeled) Gait Pattern/deviations: Step-through pattern;Decreased stance time - left;Decreased step length - right;Decreased weight shift to left;Antalgic Gait velocity: decreased   General Gait Details: cues for sequencing, posture, and widening of BOS; pt tends to adduct L LE during swing phase   Stairs            Wheelchair Mobility    Modified Rankin (Stroke Patients Only)       Balance                                    Cognition Arousal/Alertness: Awake/alert Behavior During Therapy: WFL for tasks assessed/performed Overall Cognitive Status: Within Functional Limits for tasks assessed                      Exercises Total Joint Exercises Quad Sets: AROM;Left;10 reps Heel Slides: AAROM;Left;10 reps Hip ABduction/ADduction: AAROM;Left;10 reps Long Arc Quad: AROM;Left;10 reps    General Comments General comments (skin integrity, edema, etc.): pt tends to keep L LE internally rotated; reiterated importance of being mindful of this and keeping hip precautions      Pertinent Vitals/Pain Pain Assessment: 0-10 Pain Score: 7  Pain Location: L hip Pain Descriptors / Indicators: Grimacing;Aching;Burning;Constant;Guarding Pain Intervention(s): Limited activity within patient's tolerance;Monitored during session;Premedicated before session;Repositioned;Ice applied    Home Living                      Prior Function  PT Goals (current goals can now be found in the care plan section) Acute Rehab PT Goals PT Goal Formulation: With patient Time For Goal Achievement: 10/19/16 Potential to Achieve Goals: Good Progress towards PT goals: Progressing toward goals    Frequency    7X/week      PT Plan Current plan remains appropriate    Co-evaluation             End of Session Equipment Utilized During Treatment: Gait  belt Activity Tolerance: Patient limited by pain Patient left: in chair;with call bell/phone within reach Nurse Communication: Mobility status PT Visit Diagnosis: Other abnormalities of gait and mobility (R26.89);Pain Pain - Right/Left: Left Pain - part of body: Hip     Time: 1026-1100 PT Time Calculation (min) (ACUTE ONLY): 34 min  Charges:  $Gait Training: 8-22 mins $Therapeutic Exercise: 8-22 mins                    G Codes:       Salina April, PTA Pager: [phone]   10/13/2016, 11:17 AM

## 2016-10-13 NOTE — Op Note (Signed)
PATIENT ID: [name]        MRN:  [id number]          DOB/AGE: 52/12/1965 / 52 y.o.    Joni Fears, MD   Biagio Borg, PA-C [address] [place], [place]  [address]                             [phone]   PROGRESS NOTE  Subjective:  negative for Chest Pain  negative for Shortness of Breath  negative for Nausea/Vomiting   negative for Calf Pain    Tolerating Diet: yes         Patient reports pain as mild.     Pain controlled with meds  Objective: Vital signs in last 24 hours:   Patient Vitals for the past 24 hrs:  BP Temp Temp src Pulse Resp SpO2  10/13/16 0651 116/66 98.7 F (37.1 C) Oral 85 17 94 %  10/13/16 0542 - 99.1 F (37.3 C) Axillary - - -  10/13/16 0130 117/62 100.3 F (37.9 C) Oral 85 17 94 %  10/12/16 2106 127/65 98.9 F (37.2 C) Oral 91 17 97 %  10/12/16 1452 116/68 97.1 F (36.2 C) Oral 86 16 100 %  10/12/16 1100 116/68 98.1 F (36.7 C) - 69 15 97 %  10/12/16 1045 124/77 - - 93 15 95 %  10/12/16 1030 102/78 - - 94 16 96 %  10/12/16 1020 131/78 - - (!) 101 15 100 %  10/12/16 1015 119/81 - - 88 12 96 %  10/12/16 1000 - - - 90 13 97 %  10/12/16 0950 119/83 98.1 F (36.7 C) - 100 10 93 %      Intake/Output from previous day:   03/20 0701 - 03/21 0700 In: 3847.5 [P.O.:960; I.V.:2337.5] Out: 2250 [Urine:1500]   Intake/Output this shift:   No intake/output data recorded.   Intake/Output      03/20 0701 - 03/21 0700 03/21 0701 - 03/22 0700   P.O. 960    I.V. 2337.5    IV Piggyback 550    Total Intake 3847.5     Urine 1500    Blood 750    Total Output 2250     Net +1597.5             LABORATORY DATA:  Recent Labs  10/13/16 0506  WBC 7.3  HGB 12.0*  HCT 36.4*  PLT 158    Recent Labs  10/13/16 0506  NA 137  K 4.4  CL 104  CO2 25  BUN 6  CREATININE 0.90  GLUCOSE 108*  CALCIUM 8.6*   Lab Results  Component Value Date   INR 1.06 09/29/2016   INR 0.99 01/09/2013    Recent Radiographic Studies :  Dg Chest 2  View  Result Date: 09/29/2016 CLINICAL DATA:  Preop testing for hip replacement EXAM: CHEST  2 VIEW COMPARISON:  01/09/2013 FINDINGS: Heart and mediastinal contours are within normal limits. No focal opacities or effusions. No acute bony abnormality. IMPRESSION: No active cardiopulmonary disease. Electronically Signed   By: Rolm Baptise M.D.   On: 09/29/2016 08:47   Dg Hip Port Unilat With Pelvis 1v Left  Result Date: 10/12/2016 CLINICAL DATA:  Status post left hip arthroplasty. EXAM: DG HIP (WITH OR WITHOUT PELVIS) 1V PORT LEFT COMPARISON:  Pelvis and left hip dated 09/03/2016. FINDINGS: Interval left hip bipolar prosthesis in satisfactory position and alignment. No fracture or dislocation  seen. Stable right hip bipolar prosthesis. IMPRESSION: Satisfactory appearance of a left hip bipolar prosthesis. Electronically Signed   By: Claudie Revering M.D.   On: 10/12/2016 10:43     Examination:  General appearance: alert, cooperative and no distress  Wound Exam: clean, dry, intact   Drainage:  None: wound tissue dry  Motor Exam: EHL, FHL, Anterior Tibial and Posterior Tibial Intact  Sensory Exam: Superficial Peroneal, Deep Peroneal and Tibial normal  Vascular Exam: Normal  Assessment:    1 Day Post-Op  Procedure(s) (LRB): TOTAL HIP ARTHROPLASTY (Left)  ADDITIONAL DIAGNOSIS:  Principal Problem:   Avascular necrosis of bone of hip, left (HCC) Active Problems:   Alcohol abuse, daily use   S/P total hip arthroplasty  no new problems   Plan: Physical Therapy as ordered Weight Bearing as Tolerated (WBAT)  DVT Prophylaxis:  Xarelto, Foot Pumps and TED hose  DISCHARGE PLAN: Home  DISCHARGE NEEDS: HHPT, Walker and 3-in-1 comode seat OOB with PT, saline lock IV-hope for D/C in am       Garald Balding  10/13/2016 7:49 AM

## 2016-10-13 NOTE — Progress Notes (Addendum)
Physical Therapy Treatment Patient Details Name: [name] MRN: [id number] DOB: 1966-06-16 Today's Date: 10/13/2016    History of Present Illness Admitted for LTHA, [place], WBAT;  has a past medical history of Medical history non-contributory.  has a past surgical history that includes Laceration repair (Right, ~ 2012); Carpal tunnel release (Bilateral, 2009); Total hip arthroplasty (Right, 01/11/2013)    PT Comments    Patient is able to negotiate stairs and tolerated increased gait distance. Continue to progress as tolerated with anticipated d/c home with HHPT.    Follow Up Recommendations  Supervision/Assistance - 24 hour;Home health PT (Worth considering HHPT; not sure if that's the plan)     Equipment Recommendations  Rolling walker with 5" wheels;3in1 (PT)    Recommendations for Other Services OT consult     Precautions / Restrictions Precautions Precautions: Posterior Hip Precaution Booklet Issued: Yes (comment) Precaution Comments: Educated in 3/3 Post Hip Prec; pt able to recall 2/3 without cues Restrictions Weight Bearing Restrictions: Yes LLE Weight Bearing: Weight bearing as tolerated    Mobility  Bed Mobility Overal bed mobility: Needs Assistance Bed Mobility: Supine to Sit     Supine to sit: Min assist     General bed mobility comments: pt OOB in chair upon arrival  Transfers Overall transfer level: Needs assistance Equipment used: Rolling walker (2 wheeled) Transfers: Sit to/from Stand Sit to Stand: Min guard         General transfer comment: carry over of safe hand placement and good adherence to precuations with transitional movements  Ambulation/Gait Ambulation/Gait assistance: Supervision Ambulation Distance (Feet): 150 Feet Assistive device: Rolling walker (2 wheeled) Gait Pattern/deviations: Step-through pattern;Decreased weight shift to left;Antalgic;Decreased stance time - left;Decreased step length - right Gait velocity:  decreased   General Gait Details: cues for posture   Stairs Stairs: Yes   Stair Management: One rail Right;Step to pattern;Forwards;Sideways Number of Stairs: 4 General stair comments: cues for sequencing/technique and safety  Wheelchair Mobility    Modified Rankin (Stroke Patients Only)       Balance                                    Cognition Arousal/Alertness: Awake/alert Behavior During Therapy: WFL for tasks assessed/performed Overall Cognitive Status: Within Functional Limits for tasks assessed                      Exercises Total Joint Exercises Quad Sets: AROM;Left;10 reps Heel Slides: AAROM;Left;10 reps Hip ABduction/ADduction: AAROM;Left;10 reps Long Arc Quad: AROM;Left;10 reps    General Comments General comments (skin integrity, edema, etc.): daughter present end of session      Pertinent Vitals/Pain Pain Assessment: Faces Pain Score: 7  Faces Pain Scale: Hurts little more Pain Location: L hip Pain Descriptors / Indicators: Aching;Burning;Constant;Guarding Pain Intervention(s): Monitored during session;Premedicated before session;Repositioned;Ice applied    Home Living                      Prior Function            PT Goals (current goals can now be found in the care plan section) Acute Rehab PT Goals PT Goal Formulation: With patient Time For Goal Achievement: 10/19/16 Potential to Achieve Goals: Good Progress towards PT goals: Progressing toward goals    Frequency    7X/week      PT Plan Current plan remains appropriate  Co-evaluation             End of Session Equipment Utilized During Treatment: Gait belt Activity Tolerance: Patient tolerated treatment well Patient left: in chair;with call bell/phone within reach;with family/visitor present Nurse Communication: Mobility status PT Visit Diagnosis: Other abnormalities of gait and mobility (R26.89);Pain Pain - Right/Left: Left Pain -  part of body: Hip     Time: 1430-1451 PT Time Calculation (min) (ACUTE ONLY): 21 min  Charges:  $Gait Training: 8-22 mins                     G Codes:       Salina April, PTA Pager: [phone]   10/13/2016, 3:05 PM

## 2016-10-14 LAB — BASIC METABOLIC PANEL
Anion gap: 9 (ref 5–15)
CHLORIDE: 98 mmol/L — AB (ref 101–111)
CO2: 28 mmol/L (ref 22–32)
Calcium: 8.4 mg/dL — ABNORMAL LOW (ref 8.9–10.3)
Creatinine, Ser: 0.8 mg/dL (ref 0.61–1.24)
GFR calc Af Amer: >60 mL/min (ref >60)
GFR calc non Af Amer: >60 mL/min (ref >60)
GLUCOSE: 107 mg/dL — AB (ref 65–99)
POTASSIUM: 4 mmol/L (ref 3.5–5.1)
SODIUM: 135 mmol/L (ref 135–145)

## 2016-10-14 LAB — CBC
HEMATOCRIT: 32.8 % — AB (ref 39.0–52.0)
Hemoglobin: 10.8 g/dL — ABNORMAL LOW (ref 13.0–17.0)
MCH: 32.8 pg (ref 26.0–34.0)
MCHC: 32.9 g/dL (ref 30.0–36.0)
MCV: 99.7 fL (ref 78.0–100.0)
Platelets: 151 10*3/uL (ref 150–400)
RBC: 3.29 MIL/uL — AB (ref 4.22–5.81)
RDW: 12.2 % (ref 11.5–15.5)
WBC: 8.7 10*3/uL (ref 4.0–10.5)

## 2016-10-14 MED ORDER — RIVAROXABAN 10 MG PO TABS: 10.0000 mg | ORAL_TABLET | Freq: Every day | ORAL | 0 refills | Status: DC

## 2016-10-14 MED ORDER — OXYCODONE HCL 5 MG PO TABS
5.0000 mg | ORAL_TABLET | ORAL | 0 refills | Status: DC | PRN
Start: 2016-10-14 — End: 2016-10-14

## 2016-10-14 MED ORDER — METHOCARBAMOL 500 MG PO TABS: 500.0000 mg | ORAL_TABLET | Freq: Three times a day (TID) | ORAL | 0 refills | Status: DC | PRN

## 2016-10-14 MED ORDER — OXYCODONE HCL 5 MG PO TABS: 5.0000 mg | ORAL_TABLET | ORAL | 0 refills | Status: DC | PRN

## 2016-10-14 MED ORDER — ACETAMINOPHEN 325 MG PO TABS: 650.0000 mg | ORAL_TABLET | Freq: Four times a day (QID) | ORAL | Status: DC | PRN

## 2016-10-14 NOTE — Progress Notes (Signed)
PATIENT ID: BRAXTEN MEMMER        MRN:  [id number]          DOB/AGE: 1966-06-05 / 51 y.o.    Joni Fears, MD   Biagio Borg, PA-C [address] [place], [place]  [address]                             [phone]   PROGRESS NOTE  Subjective:  negative for Chest Pain  negative for Shortness of Breath  negative for Nausea/Vomiting   negative for Calf Pain    Tolerating Diet: yes         Patient reports pain as mild.     No related problems  Objective: Vital signs in last 24 hours:   Patient Vitals for the past 24 hrs:  BP Temp Temp src Pulse Resp SpO2  10/14/16 0630 133/74 (!) 101.3 F (38.5 C) Oral 94 18 96 %  10/13/16 2123 131/74 98.1 F (36.7 C) Oral 96 18 94 %      Intake/Output from previous day:   03/21 0701 - 03/22 0700 In: 1440 [P.O.:1440] Out: 1700 [Urine:800]   Intake/Output this shift:   No intake/output data recorded.   Intake/Output      03/21 0701 - 03/22 0700 03/22 0701 - 03/23 0700   P.O. 1440    I.V.     IV Piggyback     Total Intake 1440     Urine 800    Stool 900    Blood     Total Output 1700     Net -260          Urine Occurrence 2 x       LABORATORY DATA:  Recent Labs  10/13/16 0506 10/14/16 0610  WBC 7.3 8.7  HGB 12.0* 10.8*  HCT 36.4* 32.8*  PLT 158 151    Recent Labs  10/13/16 0506 10/14/16 0610  NA 137 135  K 4.4 4.0  CL 104 98*  CO2 25 28  BUN 6 <5*  CREATININE 0.90 0.80  GLUCOSE 108* 107*  CALCIUM 8.6* 8.4*   Lab Results  Component Value Date   INR 1.06 09/29/2016   INR 0.99 01/09/2013    Recent Radiographic Studies :  Dg Chest 2 View  Result Date: 09/29/2016 CLINICAL DATA:  Preop testing for hip replacement EXAM: CHEST  2 VIEW COMPARISON:  01/09/2013 FINDINGS: Heart and mediastinal contours are within normal limits. No focal opacities or effusions. No acute bony abnormality. IMPRESSION: No active cardiopulmonary disease. Electronically Signed   By: Rolm Baptise M.D.   On: 09/29/2016 08:47   Dg Hip  Port Unilat With Pelvis 1v Left  Result Date: 10/12/2016 CLINICAL DATA:  Status post left hip arthroplasty. EXAM: DG HIP (WITH OR WITHOUT PELVIS) 1V PORT LEFT COMPARISON:  Pelvis and left hip dated 09/03/2016. FINDINGS: Interval left hip bipolar prosthesis in satisfactory position and alignment. No fracture or dislocation seen. Stable right hip bipolar prosthesis. IMPRESSION: Satisfactory appearance of a left hip bipolar prosthesis. Electronically Signed   By: Claudie Revering M.D.   On: 10/12/2016 10:43     Examination:  General appearance: alert, cooperative and no distress  Wound Exam: clean, dry, intact   Drainage:  None: wound tissue dry  Motor Exam: EHL, FHL, Anterior Tibial and Posterior Tibial Intact  Sensory Exam: Superficial Peroneal, Deep Peroneal and Tibial normal  Vascular Exam: Normal  Assessment:    2 Days  Post-Op  Procedure(s) (LRB): TOTAL HIP ARTHROPLASTY (Left)  ADDITIONAL DIAGNOSIS:  Principal Problem:   Avascular necrosis of bone of hip, left (HCC) Active Problems:   Alcohol abuse, daily use   S/P total hip arthroplasty  Acute Blood Loss Anemia-asymptomatic   Plan: Physical Therapy as ordered Weight Bearing as Tolerated (WBAT)  DVT Prophylaxis:  Xarelto and TED hose  DISCHARGE PLAN: Home  DISCHARGE NEEDS: HHPT, Walker and 3-in-1 comode seat Discharge today-comfortable without SOB,chest pain or abdominal complaints. Voiding without difficulty. Good effort in PT.  Dressing changed left hip-wound clean, dry.Repeat temp 99.9 after a cup of coffee      Garald Balding  10/14/2016 11:52 AM  Patient ID: Isac Caddy, male   DOB: 04/20/1966, 51 y.o.   MRN: [id number]

## 2016-10-14 NOTE — Discharge Summary (Signed)
[name] Fears, MD   [name] Borg, PA-C [address] [place], [place]  [address]                             [phone]  PATIENT ID: [name]        MRN:  [id number]          DOB/AGE: 52/12/1965 / 52 y.o.    DISCHARGE SUMMARY  ADMISSION DATE:    10/12/2016 DISCHARGE DATE:   10/14/2016   ADMISSION DIAGNOSIS: LEFT HIP OSTEOARTHRITIS WITH AVASCULAR NECROSIS    DISCHARGE DIAGNOSIS:  LEFT HIP OSTEOARTHRITIS WITH AVASCULAR NECROSIS    ADDITIONAL DIAGNOSIS: Principal Problem:   Avascular necrosis of bone of hip, left (HCC) Active Problems:   Alcohol abuse, daily use   S/P total hip arthroplasty  Past Medical History:  Diagnosis Date  . Medical history non-contributory     PROCEDURE: Procedure(s): TOTAL HIP ARTHROPLASTY Left on 10/12/2016  CONSULTS: none    HISTORY: [name] a 51 y.o. male  Who has a history of pain and functional disability in the left hip(s) due to Avascular Necrosis and patient has failed non-surgical conservative treatments for greater than 12 weeks to include NSAID's and/or analgesics, flexibility and strengthening excercises, use of assistive devices, weight reduction as appropriate and activity modification.  Onset of symptoms was gradual starting 1 years ago with gradually worsening course since that time.The patient noted no past surgery on the left hip(s).  Patient currently rates pain in the left hip at 8 out of 10 with activity. Patient has night pain, worsening of pain with activity and weight bearing, trendelenberg gait, pain that interfers with activities of daily living, pain with passive range of motion and crepitus. Patient has evidence of subchondral cysts, subchondral sclerosis, periarticular osteophytes and joint space narrowing by imaging studies. This condition presents safety issues increasing the risk of falls.This patient has had avascular necrosis of the hip, acetabular fracture, hip dysplasia.  There is no current active  infection.  HOSPITAL COURSE:  [name] is a 51 y.o. admitted on 10/12/2016 and found to have a diagnosis of LEFT HIP OSTEOARTHRITIS WITH AVASCULAR NECROSIS.  After appropriate laboratory studies were obtained  they were taken to the operating room on 10/12/2016 and underwent  Procedure(s): TOTAL HIP ARTHROPLASTY  Left.   They were given perioperative antibiotics:  Anti-infectives    Start     Dose/Rate Route Frequency Ordered Stop   10/12/16 1330  ceFAZolin (ANCEF) IVPB 1 g/50 mL premix     1 g 100 mL/hr over 30 Minutes Intravenous Every 6 hours 10/12/16 1120 10/12/16 1812   10/12/16 0626  ceFAZolin (ANCEF) 2-4 GM/100ML-% IVPB    Comments:  Ernesta Amble   : cabinet override      10/12/16 0626 10/12/16 1829   10/12/16 0624  ceFAZolin (ANCEF) IVPB 2g/100 mL premix  Status:  Discontinued     2 g 200 mL/hr over 30 Minutes Intravenous On call to O.R. 10/12/16 2440 10/12/16 1119    .  Tolerated the procedure well.  CELEBREX AND po TYLENOL given preop by anesthesia.  Ambulated on floor  POD #1, allowed out of bed to a chair.  PT for ambulation and exercise program.   IV saline locked.  O2 discontionued.  POD #2, continued PT and ambulation.    The remainder of the hospital course was dedicated to ambulation and strengthening.   The patient was discharged on  2 Days Post-Op in  Stable condition.  Blood products given:none  DIAGNOSTIC STUDIES: Recent vital signs: Patient Vitals for the past 24 hrs:  BP Temp Temp src Pulse Resp SpO2  10/14/16 0630 133/74 (!) 101.3 F (38.5 C) Oral 94 18 96 %  10/13/16 2123 131/74 98.1 F (36.7 C) Oral 96 18 94 %       Recent laboratory studies:  Recent Labs  10/13/16 0506 10/14/16 0610  WBC 7.3 8.7  HGB 12.0* 10.8*  HCT 36.4* 32.8*  PLT 158 151    Recent Labs  10/13/16 0506 10/14/16 0610  NA 137 135  K 4.4 4.0  CL 104 98*  CO2 25 28  BUN 6 <5*  CREATININE 0.90 0.80  GLUCOSE 108* 107*  CALCIUM 8.6* 8.4*   Lab Results  Component  Value Date   INR 1.06 09/29/2016   INR 0.99 01/09/2013     Recent Radiographic Studies :  Dg Chest 2 View  Result Date: 09/29/2016 CLINICAL DATA:  Preop testing for hip replacement EXAM: CHEST  2 VIEW COMPARISON:  01/09/2013 FINDINGS: Heart and mediastinal contours are within normal limits. No focal opacities or effusions. No acute bony abnormality. IMPRESSION: No active cardiopulmonary disease. Electronically Signed   By: Rolm Baptise M.D.   On: 09/29/2016 08:47   Dg Hip Port Unilat With Pelvis 1v Left  Result Date: 10/12/2016 CLINICAL DATA:  Status post left hip arthroplasty. EXAM: DG HIP (WITH OR WITHOUT PELVIS) 1V PORT LEFT COMPARISON:  Pelvis and left hip dated 09/03/2016. FINDINGS: Interval left hip bipolar prosthesis in satisfactory position and alignment. No fracture or dislocation seen. Stable right hip bipolar prosthesis. IMPRESSION: Satisfactory appearance of a left hip bipolar prosthesis. Electronically Signed   By: Claudie Revering M.D.   On: 10/12/2016 10:43    DISCHARGE INSTRUCTIONS: Discharge Instructions    Call MD / Call 911    Complete by:  As directed    If you experience chest pain or shortness of breath, CALL 911 and be transported to the hospital emergency room.  If you develope a fever above 101 F, pus (white drainage) or increased drainage or redness at the wound, or calf pain, call your surgeon's office.   Change dressing    Complete by:  As directed    DO NOT CHANGE THE DRESSING   Constipation Prevention    Complete by:  As directed    Drink plenty of fluids.  Prune juice may be helpful.  You may use a stool softener, such as Colace (over the counter) 100 mg twice a day.  Use MiraLax (over the counter) for constipation as needed.   Diet general    Complete by:  As directed    Discharge instructions    Complete by:  As directed    Biggs items at home which could result in a fall. This includes throw rugs or furniture in  walking pathways ICE to the affected joint every three hours while awake for 30 minutes at a time, for at least the first 3-5 days, and then as needed for pain and swelling.  Continue to use ice for pain and swelling. You may notice swelling that will progress down to the foot and ankle.  This is normal after surgery.  Elevate your leg when you are not up walking on it.   Continue to use the breathing machine you got in the hospital (incentive spirometer) which will help keep your temperature down.  It is common for your temperature to cycle up and down following surgery, especially at night when you are not up moving around and exerting yourself.  The breathing machine keeps your lungs expanded and your temperature down.   DIET:  As you were doing prior to hospitalization, we recommend a well-balanced diet.  DRESSING / WOUND CARE / SHOWERING  Keep the surgical dressing until follow up.  The dressing is water proof, so you can shower without any extra covering.  IF THE DRESSING FALLS OFF or the wound gets wet inside, change the dressing with sterile gauze.  Please use good hand washing techniques before changing the dressing.  Do not use any lotions or creams on the incision until instructed by your surgeon.    ACTIVITY  Increase activity slowly as tolerated, but follow the weight bearing instructions below.   No driving for 6 weeks or until further direction given by your physician.  You cannot drive while taking narcotics.  No lifting or carrying greater than 10 lbs. until further directed by your surgeon. Avoid periods of inactivity such as sitting longer than an hour when not asleep. This helps prevent blood clots.  You may return to work once you are authorized by your doctor.     WEIGHT BEARING   Weight bearing as tolerated with assist device (walker, cane, etc) as directed, use it as long as suggested by your surgeon or therapist, typically at least 4-6 weeks.   EXERCISES  Results  after joint replacement surgery are often greatly improved when you follow the exercise, range of motion and muscle strengthening exercises prescribed by your doctor. Safety measures are also important to protect the joint from further injury. Any time any of these exercises cause you to have increased pain or swelling, decrease what you are doing until you are comfortable again and then slowly increase them. If you have problems or questions, call your caregiver or physical therapist for advice.   Rehabilitation is important following a joint replacement. After just a few days of immobilization, the muscles of the leg can become weakened and shrink (atrophy).  These exercises are designed to build up the tone and strength of the thigh and leg muscles and to improve motion. Often times heat used for twenty to thirty minutes before working out will loosen up your tissues and help with improving the range of motion but do not use heat for the first two weeks following surgery (sometimes heat can increase post-operative swelling).   These exercises can be done on a training (exercise) mat, on a table or on a bed. Use whatever works the best and is most comfortable for you.    Use music or television while you are exercising so that the exercises are a pleasant break in your day. This will make your life better with the exercises acting as a break in your routine that you can look forward to.   Perform all exercises about fifteen times, three times per day or as directed.  You should exercise both the operative leg and the other leg as well.   Exercises include:  Quad Sets - Tighten up the muscle on the front of the thigh (Quad) and hold for 5-10 seconds.   Straight Leg Raises - With your knee straight (if you were given a brace, keep it on), lift the leg to 60 degrees, hold for 3 seconds, and slowly lower the leg.  Perform this exercise against resistance later as your leg gets stronger.  Leg Slides: Lying on  your back, slowly slide your foot toward your buttocks, bending your knee up off the floor (only go as far as is comfortable). Then slowly slide your foot back down until your leg is flat on the floor again.  Angel Wings: Lying on your back spread your legs to the side as far apart as you can without causing discomfort.  Hamstring Strength:  Lying on your back, push your heel against the floor with your leg straight by tightening up the muscles of your buttocks.  Repeat, but this time bend your knee to a comfortable angle, and push your heel against the floor.  You may put a pillow under the heel to make it more comfortable if necessary.   A rehabilitation program following joint replacement surgery can speed recovery and prevent re-injury in the future due to weakened muscles. Contact your doctor or a physical therapist for more information on knee rehabilitation.    CONSTIPATION  Constipation is defined medically as fewer than three stools per week and severe constipation as less than one stool per week.  Even if you have a regular bowel pattern at home, your normal regimen is likely to be disrupted due to multiple reasons following surgery.  Combination of anesthesia, postoperative narcotics, change in appetite and fluid intake all can affect your bowels.   YOU MUST use at least one of the following options; they are listed in order of increasing strength to get the job done.  They are all available over the counter, and you may need to use some, POSSIBLY even all of these options:    Drink plenty of fluids (prune juice may be helpful) and high fiber foods Colace 100 mg by mouth twice a day  Senokot for constipation as directed and as needed Dulcolax (bisacodyl), take with full glass of water  Miralax (polyethylene glycol) once or twice a day as needed.  If you have tried all these things and are unable to have a bowel movement in the first 3-4 days after surgery call either your surgeon or your  primary doctor.    If you experience loose stools or diarrhea, hold the medications until you stool forms back up.  If your symptoms do not get better within 1 week or if they get worse, check with your doctor.  If you experience "the worst abdominal pain ever" or develop nausea or vomiting, please contact the office immediately for further recommendations for treatment.   ITCHING:  If you experience itching with your medications, try taking only a single pain pill, or even half a pain pill at a time.  You can also use Benadryl over the counter for itching or also to help with sleep.   TED HOSE STOCKINGS:  Use stockings on both legs until for at least 2 weeks or as directed by physician office. They may be removed at night for sleeping.  MEDICATIONS:  See your medication summary on the "After Visit Summary" that nursing will review with you.  You may have some home medications which will be placed on hold until you complete the course of blood thinner medication.  It is important for you to complete the blood thinner medication as prescribed.  PRECAUTIONS:  If you experience chest pain or shortness of breath - call 911 immediately for transfer to the hospital emergency department.   If you develop a fever greater that 101 F, purulent drainage from wound, increased redness or drainage from wound, foul odor from the  wound/dressing, or calf pain - CONTACT YOUR SURGEON.                                                   FOLLOW-UP APPOINTMENTS:  If you do not already have a post-op appointment, please call the office for an appointment to be seen by your surgeon.  Guidelines for how soon to be seen are listed in your "After Visit Summary", but are typically between 1-4 weeks after surgery.  OTHER INSTRUCTIONS:   Knee Replacement:  Do not place pillow under knee, focus on keeping the knee straight while resting. CPM instructions: 0-90 degrees, 2 hours in the morning, 2 hours in the afternoon, and 2 hours  in the evening. Place foam block, curve side up under heel at all times except when in CPM or when walking.  DO NOT modify, tear, cut, or change the foam block in any way.  MAKE SURE YOU:  Understand these instructions.  Get help right away if you are not doing well or get worse.    Thank you for letting us be a part of your medical care team.  It is a privilege we respect greatly.  We hope these instructions will help you stay on track for a fast and full recovery!   Driving restrictions    Complete by:  As directed    No driving for 6 weeks   Follow the hip precautions as taught in Physical Therapy    Complete by:  As directed    Increase activity slowly as tolerated    Complete by:  As directed    Lifting restrictions    Complete by:  As directed    No lifting for 6 weeks   Patient may shower    Complete by:  As directed    You may shower over the brown dressing   TED hose    Complete by:  As directed    Use stockings (TED hose) for 2 weeks on left leg.  You may remove them at night for sleeping.   Weight bearing as tolerated    Complete by:  As directed    Laterality:  left   Extremity:  Lower      DISCHARGE MEDICATIONS:   Allergies as of 10/14/2016      Reactions   Hydrocodone Nausea And Vomiting      Medication List    STOP taking these medications   aspirin EC 81 MG tablet   oxyCODONE-acetaminophen 10-325 MG tablet Commonly known as:  PERCOCET     TAKE these medications   acetaminophen 325 MG tablet Commonly known as:  TYLENOL Take 2 tablets (650 mg total) by mouth every 6 (six) hours as needed for mild pain (or Fever >/= 101).   methocarbamol 500 MG tablet Commonly known as:  ROBAXIN Take 1 tablet (500 mg total) by mouth every 8 (eight) hours as needed for muscle spasms. What changed:  when to take this  reasons to take this   oxyCODONE 5 MG immediate release tablet Commonly known as:  Oxy IR/ROXICODONE Take 1-2 tablets (5-10 mg total) by mouth  every 4 (four) hours as needed for breakthrough pain. What changed:  reasons to take this   rivaroxaban 10 MG Tabs tablet Commonly known as:  XARELTO Take 1 tablet (10 mg total) by mouth daily with breakfast.  Start taking on:  10/15/2016 What changed:  when to take this            Durable Medical Equipment        Start     Ordered   10/12/16 1121  DME Walker rolling  Once    Question:  Patient needs a walker to treat with the following condition  Answer:  S/P total hip arthroplasty   10/12/16 1120   10/12/16 1121  DME 3 n 1  Once     10/12/16 1120   10/12/16 1121  DME Bedside commode  Once    Question:  Patient needs a bedside commode to treat with the following condition  Answer:  S/P total hip arthroplasty   10/12/16 1120      FOLLOW UP VISIT:   Follow-up Information    [facility] Follow up.   Specialty:  [place] Why:  A representative from [facility] will contact you to arrange start date and time for therapy. Contact information: [place] [address] [phone]        Garald Balding, MD Follow up on 10/25/2016.   Specialty:  Orthopedic Surgery Contact information: [address] [place] [address] [phone]           DISPOSITION:   Home  CONDITION:  Stable   Mike Craze. Broussard, Elizabeth [phone]  10/14/2016 11:59 AM

## 2016-10-14 NOTE — Progress Notes (Signed)
Physical Therapy Treatment Patient Details Name: [name] MRN: [id number] DOB: 01-05-1966 Today's Date: 10/14/2016    History of Present Illness Admitted for [facility], [place], [facility];  has a past medical history of Medical history non-contributory.  has a past surgical history that includes Laceration repair (Right, ~ 2012); Carpal tunnel release (Bilateral, 2009); Total hip arthroplasty (Right, 01/11/2013)    PT Comments    Patient is making good progress with PT.  From a mobility standpoint anticipate patient will be ready for DC home when medically ready.      Follow Up Recommendations  Supervision/Assistance - 24 hour;Home health PT     Equipment Recommendations  Rolling walker with 5" wheels;3in1 (PT)    Recommendations for Other Services OT consult     Precautions / Restrictions Precautions Precautions: Posterior Hip Precaution Comments: pt able to recall 3/3 precautions Restrictions Weight Bearing Restrictions: Yes LLE Weight Bearing: Weight bearing as tolerated    Mobility  Bed Mobility               General bed mobility comments: pt OOB in chair upon arrival  Transfers Overall transfer level: Needs assistance Equipment used: Rolling walker (2 wheeled) Transfers: Sit to/from Stand Sit to Stand: Supervision         General transfer comment: supervision for safety; carry over of safe hand placement and good adherence to precuations with transitional movements  Ambulation/Gait Ambulation/Gait assistance: Supervision Ambulation Distance (Feet): 180 Feet Assistive device: Rolling walker (2 wheeled) Gait Pattern/deviations: Step-through pattern;Decreased weight shift to left;Decreased stance time - left;Decreased step length - right Gait velocity: decreased   General Gait Details: cues for posture; pt with improved weight bearing on L LE and with less antalgic gait this session   Stairs            Wheelchair Mobility    Modified Rankin  (Stroke Patients Only)       Balance                                    Cognition Arousal/Alertness: Awake/alert Behavior During Therapy: WFL for tasks assessed/performed Overall Cognitive Status: Within Functional Limits for tasks assessed                      Exercises Total Joint Exercises Hip ABduction/ADduction: AROM;Left;10 reps;Standing Knee Flexion: AROM;Left;10 reps;Standing Marching in Standing: AROM;Left;10 reps;Standing Standing Hip Extension: AROM;Left;10 reps;Standing    General Comments        Pertinent Vitals/Pain Pain Assessment: Faces Faces Pain Scale: Hurts little more Pain Location: L hip Pain Descriptors / Indicators: Aching;Guarding Pain Intervention(s): Monitored during session;Patient requesting pain meds-RN notified;Ice applied;Repositioned    Home Living                      Prior Function            PT Goals (current goals can now be found in the care plan section) Acute Rehab PT Goals PT Goal Formulation: With patient Time For Goal Achievement: 10/19/16 Potential to Achieve Goals: Good Progress towards PT goals: Progressing toward goals    Frequency    7X/week      PT Plan Current plan remains appropriate    Co-evaluation             End of Session Equipment Utilized During Treatment: Gait belt Activity Tolerance: Patient tolerated treatment well Patient left:  in chair;with call bell/phone within reach;with family/visitor present Nurse Communication: Mobility status PT Visit Diagnosis: Other abnormalities of gait and mobility (R26.89);Pain Pain - Right/Left: Left Pain - part of body: Hip     Time: 1026-1050 PT Time Calculation (min) (ACUTE ONLY): 24 min  Charges:  $Gait Training: 8-22 mins $Therapeutic Exercise: 8-22 mins                    G Codes:       [place] April, PTA Pager: [phone]   10/14/2016, 2:04 PM

## 2016-10-14 NOTE — Progress Notes (Signed)
Discharge instructions and prescriptions reviewed with patient. Patient stated understanding. IV removed. Patient has family at bedside for transportation

## 2016-10-14 NOTE — Care Management Note (Signed)
Case Management Note  Patient Details  Name: [name] MRN: [id number] Date of Birth: 1966-06-22  Subjective/Objective:   51 yr old gentleman s/p left total hip arthroplasty.                 Action/Plan: Case manager spoke with patient concerning [place] and DME needs at discharge. Patient was preoperatively setup with [facility], no changes. Will have family support at discharge.    Expected Discharge Date:  10/14/16               Expected Discharge Plan:   Home with Home Health  In-House Referral:     Discharge planning Services  CM Consult  Post Acute Care Choice:  Durable Medical Equipment, Home Health Choice offered to:  Patient  DME Arranged:  3-N-1, Walker rolling DME Agency:  [place]:  PT [place]:  [facility] (formerly [facility])  Status of Service:  Completed, signed off  If discussed at [employer], dates discussed:    Additional Comments:  Ninfa Meeker, RN 10/14/2016, 11:59 AM

## 2016-10-15 ENCOUNTER — Telehealth (INDEPENDENT_AMBULATORY_CARE_PROVIDER_SITE_OTHER): Payer: Self-pay | Admitting: Orthopaedic Surgery

## 2016-10-15 NOTE — Telephone Encounter (Signed)
Cindee Salt, a physical therapist with [facility] called requesting the following:  1 week 1 3 week 1 1 week 1   cb# [phone]

## 2016-10-18 NOTE — Telephone Encounter (Signed)
LVMOM per PW, OK with outlined therapy

## 2016-10-28 ENCOUNTER — Encounter (INDEPENDENT_AMBULATORY_CARE_PROVIDER_SITE_OTHER): Payer: Self-pay | Admitting: Orthopaedic Surgery

## 2016-10-28 ENCOUNTER — Ambulatory Visit (INDEPENDENT_AMBULATORY_CARE_PROVIDER_SITE_OTHER): Payer: Self-pay

## 2016-10-28 ENCOUNTER — Ambulatory Visit (INDEPENDENT_AMBULATORY_CARE_PROVIDER_SITE_OTHER): Payer: BLUE CROSS/BLUE SHIELD | Admitting: Orthopaedic Surgery

## 2016-10-28 VITALS — BP 138/85 | HR 70 | Ht 72.0 in | Wt 240.0 lb

## 2016-10-28 DIAGNOSIS — Z96642 Presence of left artificial hip joint: Secondary | ICD-10-CM | POA: Diagnosis not present

## 2016-10-28 NOTE — Progress Notes (Signed)
Office Visit Note   Patient: [name]           Date of Birth: 10/18/1965           MRN: [id number] Visit Date: 10/28/2016              Requested by: Jearld Fenton, NP [place], [place] [id number] PCP: Webb Silversmith, NP   Assessment & Plan: Visit Diagnoses:  1. Presence of left artificial hip joint     Plan:  #1: Continue ambulation.(He is not using any assisted devices this time.) #2:: Interval if he has any problems.  #3:  continue Xarelto   Follow-Up Instructions: Return in about 2 weeks (around 11/11/2016).   Orders:  Orders Placed This Encounter  Procedures  . XR HIP UNILAT W OR W/O PELVIS 2-3 VIEWS LEFT   No orders of the defined types were placed in this encounter.     Procedures: No procedures performed   Clinical Data: No additional findings.   Subjective: Chief Complaint  Patient presents with  . Left Hip - Routine Post Op    [name] is a 51 y.o. Male status post 2 week , 2 day  Avascular necrosis of left hip , total left hip replacement.    Review of Systems   Objective: Vital Signs: There were no vitals taken for this visit.  Physical Exam  Ortho Exam  The wound is clean and dry no signs of infection. Staples removed and Steri-Strips were placed. He's got good motion without pain with range of motion with about 10-50 of internal and external rotation. Negative straight leg raise. No calf pain noted.  Specialty Comments:  No specialty comments available.  Imaging: Xr Hip Unilat W Or W/o Pelvis 2-3 Views Left  Result Date: 10/28/2016 Two-view x-ray of the left hip reveals excellent position alignment of the prosthesis.    PMFS History: Patient Active Problem List   Diagnosis Date Noted  . Avascular necrosis of bone of hip, left ([facility]) 10/12/2016  . Alcohol abuse, daily use 10/12/2016  . S/P total hip arthroplasty 10/12/2016  . Rectal bleeding 11/16/2012   Past Medical History:  Diagnosis Date  .  Medical history non-contributory     Family History  Problem Relation Age of Onset  . Cancer Father     Hodgkin's disease  . Diabetes Neg Hx   . Stroke Neg Hx   . Hypertension Neg Hx   . Hyperlipidemia Neg Hx     Past Surgical History:  Procedure Laterality Date  . CARPAL TUNNEL RELEASE Bilateral 2009  . COLONOSCOPY W/ POLYPECTOMY    . JOINT REPLACEMENT    . LACERATION REPAIR Right ~ 2012   leg  . TOTAL HIP ARTHROPLASTY Right 01/11/2013   Procedure: TOTAL HIP ARTHROPLASTY;  Surgeon: Garald Balding, MD;  Location: [facility];  Service: Orthopedics;  Laterality: Right;  . TOTAL HIP ARTHROPLASTY Left 10/12/2016   Procedure: TOTAL HIP ARTHROPLASTY;  Surgeon: Garald Balding, MD;  Location: [facility];  Service: Orthopedics;  Laterality: Left;   Social History   Occupational History  . 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 Smoking status: Former Smoker    Packs/day: 1.00    Years: 14.00    Types: Cigarettes    Quit date: 07/27/1999  . Smokeless tobacco: Never Used  . Alcohol use 33.6 oz/week    56 Cans of beer per week  Comment: drinks 11 beers per day  . Drug use: No  . Sexual activity: Yes

## 2016-10-29 ENCOUNTER — Inpatient Hospital Stay (INDEPENDENT_AMBULATORY_CARE_PROVIDER_SITE_OTHER): Payer: BLUE CROSS/BLUE SHIELD | Admitting: Orthopaedic Surgery

## 2016-11-02 ENCOUNTER — Other Ambulatory Visit (INDEPENDENT_AMBULATORY_CARE_PROVIDER_SITE_OTHER): Payer: Self-pay | Admitting: Orthopedic Surgery

## 2016-11-02 ENCOUNTER — Telehealth (INDEPENDENT_AMBULATORY_CARE_PROVIDER_SITE_OTHER): Payer: Self-pay | Admitting: Orthopaedic Surgery

## 2016-11-02 MED ORDER — OXYCODONE HCL 5 MG PO TABS: 5.0000 mg | ORAL_TABLET | Freq: Four times a day (QID) | ORAL | 0 refills | Status: DC | PRN

## 2016-11-02 MED ORDER — METHOCARBAMOL 500 MG PO TABS: 500.0000 mg | ORAL_TABLET | Freq: Three times a day (TID) | ORAL | 0 refills | Status: DC | PRN

## 2016-11-02 NOTE — Telephone Encounter (Signed)
Call him and tell him rx is here...idiopathic thrombocytopenic purpura is on your desk!

## 2016-11-02 NOTE — Telephone Encounter (Signed)
Please advise 

## 2016-11-02 NOTE — Telephone Encounter (Signed)
Patient called requesting a refill of his muscle relaxer and also his pain pills (Oxycodone)  [facility] uses the [facility] on [address].  CB#[phone].  Thank you.

## 2016-11-03 NOTE — Telephone Encounter (Signed)
COuld you please out this rx at front desk and I will call him? Thank you!

## 2016-11-12 ENCOUNTER — Ambulatory Visit (INDEPENDENT_AMBULATORY_CARE_PROVIDER_SITE_OTHER): Payer: BLUE CROSS/BLUE SHIELD | Admitting: Orthopaedic Surgery

## 2016-11-12 ENCOUNTER — Encounter (INDEPENDENT_AMBULATORY_CARE_PROVIDER_SITE_OTHER): Payer: Self-pay | Admitting: Orthopaedic Surgery

## 2016-11-12 VITALS — BP 133/97 | HR 104 | Resp 14 | Ht 72.0 in | Wt 240.0 lb

## 2016-11-12 DIAGNOSIS — Z96642 Presence of left artificial hip joint: Secondary | ICD-10-CM

## 2016-11-12 NOTE — Progress Notes (Signed)
Office Visit Note   Patient: [name]           Date of Birth: [birth year]           MRN: [id number] Visit Date: 11/12/2016              Requested by: Jearld Fenton, NP [place], [place] [address] PCP: Webb Silversmith, NP   Assessment & Plan: Visit Diagnoses:  1. Presence of left artificial hip joint   2. Status post left hip replacement     Plan: One month status post primary left total hip replacement and doing quite well. Has returned to work with some limited capacity. Not experiencing any pain. Return to the office in 3 months  Follow-Up Instructions: Return in about 3 months (around 02/11/2017).   Orders:  No orders of the defined types were placed in this encounter.  No orders of the defined types were placed in this encounter.     Procedures: No procedures performed   Clinical Data: No additional findings.   Subjective: Chief Complaint  Patient presents with  . Left Hip - Routine Post Op    [name] is a 51 y o S/P Left Hip replacement. He relates is back to work , not 100% as his L hip is sore and he has muscle spasms.    [name] is 1 month status post primary left total hip replacement doing quite well. He denies any numbness or tingling in this leg except at night when he'll have some quivering of his left buttock. His incision is healing without problems. He feels like his leg lengths are symmetrical. HPI  Review of Systems   Objective: Vital Signs: BP (!) 133/97   Pulse (!) 104   Resp 14   Ht 6' (1.829 m)   Wt 240 lb (108.9 kg)   BMI 32.55 kg/m   Physical Exam  Ortho Exam left hip incision healing without problem. Neurovascular exam intact. Painless range of motion left hip with internal and external rotation. Straight leg raise negative. Walks without a limp.  Specialty Comments:  No specialty comments available.  Imaging: No results found.   PMFS History: Patient Active Problem List   Diagnosis Date Noted    . Avascular necrosis of bone of hip, left ([facility]) 10/12/2016  . Alcohol abuse, daily use 10/12/2016  . S/P total hip arthroplasty 10/12/2016  . Rectal bleeding 11/16/2012   Past Medical History:  Diagnosis Date  . Medical history non-contributory     Family History  Problem Relation Age of Onset  . Cancer Father     Hodgkin's disease  . Diabetes Neg Hx   . Stroke Neg Hx   . Hypertension Neg Hx   . Hyperlipidemia Neg Hx     Past Surgical History:  Procedure Laterality Date  . CARPAL TUNNEL RELEASE Bilateral 2009  . COLONOSCOPY W/ POLYPECTOMY    . JOINT REPLACEMENT    . LACERATION REPAIR Right ~ 2012   leg  . TOTAL HIP ARTHROPLASTY Right 01/11/2013   Procedure: TOTAL HIP ARTHROPLASTY;  Surgeon: Garald Balding, MD;  Location: [facility];  Service: Orthopedics;  Laterality: Right;  . 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  Smoking status: Former Smoker    Packs/day: 1.00    Years: 14.00    Types: Cigarettes    Quit date: 07/27/1999  . Smokeless tobacco: Never Used  . Alcohol use 33.6 oz/week    56 Cans of beer per week     Comment: drinks 11 beers per day  . Drug use: No  . Sexual activity: Yes

## 2016-12-08 ENCOUNTER — Telehealth (INDEPENDENT_AMBULATORY_CARE_PROVIDER_SITE_OTHER): Payer: Self-pay | Admitting: Orthopaedic Surgery

## 2016-12-08 NOTE — Telephone Encounter (Signed)
Patient needs a refill on his pain meds,( Oxycodone) and muscle relaxer( Robaxin). Please call patient when ready to pick up.

## 2016-12-09 ENCOUNTER — Other Ambulatory Visit (INDEPENDENT_AMBULATORY_CARE_PROVIDER_SITE_OTHER): Payer: Self-pay

## 2016-12-09 MED ORDER — METHOCARBAMOL 500 MG PO TABS: 500.0000 mg | ORAL_TABLET | Freq: Two times a day (BID) | ORAL | 0 refills | Status: DC | PRN

## 2016-12-09 MED ORDER — OXYCODONE HCL 5 MG PO TABS: 5.0000 mg | ORAL_TABLET | Freq: Two times a day (BID) | ORAL | 0 refills | Status: DC | PRN

## 2016-12-09 NOTE — Telephone Encounter (Signed)
Please advise 

## 2016-12-09 NOTE — Telephone Encounter (Signed)
Ok to refill 

## 2016-12-09 NOTE — Telephone Encounter (Signed)
Printed per PW and front desk Mable Fill will call for pick up

## 2017-01-14 ENCOUNTER — Ambulatory Visit (INDEPENDENT_AMBULATORY_CARE_PROVIDER_SITE_OTHER): Payer: BLUE CROSS/BLUE SHIELD | Admitting: Orthopaedic Surgery

## 2017-01-17 ENCOUNTER — Ambulatory Visit (INDEPENDENT_AMBULATORY_CARE_PROVIDER_SITE_OTHER): Payer: BLUE CROSS/BLUE SHIELD | Admitting: Orthopaedic Surgery

## 2017-01-18 ENCOUNTER — Encounter (INDEPENDENT_AMBULATORY_CARE_PROVIDER_SITE_OTHER): Payer: Self-pay

## 2017-01-18 ENCOUNTER — Telehealth (INDEPENDENT_AMBULATORY_CARE_PROVIDER_SITE_OTHER): Payer: Self-pay | Admitting: Orthopaedic Surgery

## 2017-01-18 NOTE — Telephone Encounter (Signed)
Patient called stating that his employer needs to know what date he was released to go back to work.  CB#[phone].  Thank you.

## 2017-01-18 NOTE — Telephone Encounter (Signed)
Call pt and ask when the note needs to be dated

## 2017-01-18 NOTE — Telephone Encounter (Signed)
Spoke with pt and letter is at front desk.

## 2017-01-18 NOTE — Telephone Encounter (Signed)
Please advise. No letter in chart.

## 2017-02-04 ENCOUNTER — Ambulatory Visit (INDEPENDENT_AMBULATORY_CARE_PROVIDER_SITE_OTHER): Payer: BLUE CROSS/BLUE SHIELD | Admitting: Orthopaedic Surgery

## 2017-04-01 DIAGNOSIS — R600 Localized edema: Secondary | ICD-10-CM | POA: Insufficient documentation

## 2017-04-01 DIAGNOSIS — R739 Hyperglycemia, unspecified: Secondary | ICD-10-CM | POA: Insufficient documentation

## 2018-08-21 DIAGNOSIS — Z0279 Encounter for issue of other medical certificate: Secondary | ICD-10-CM

## 2018-10-29 IMAGING — CR DG HIP (WITH OR WITHOUT PELVIS) 2-3V*L*
3 series · 3 of 3 positions shown · non-contrast
Comparison: None.

CLINICAL DATA: Left hip pain 1 week

EXAM:
DG HIP (WITH OR WITHOUT PELVIS) 2-3V LEFT

[t pelvis ap]
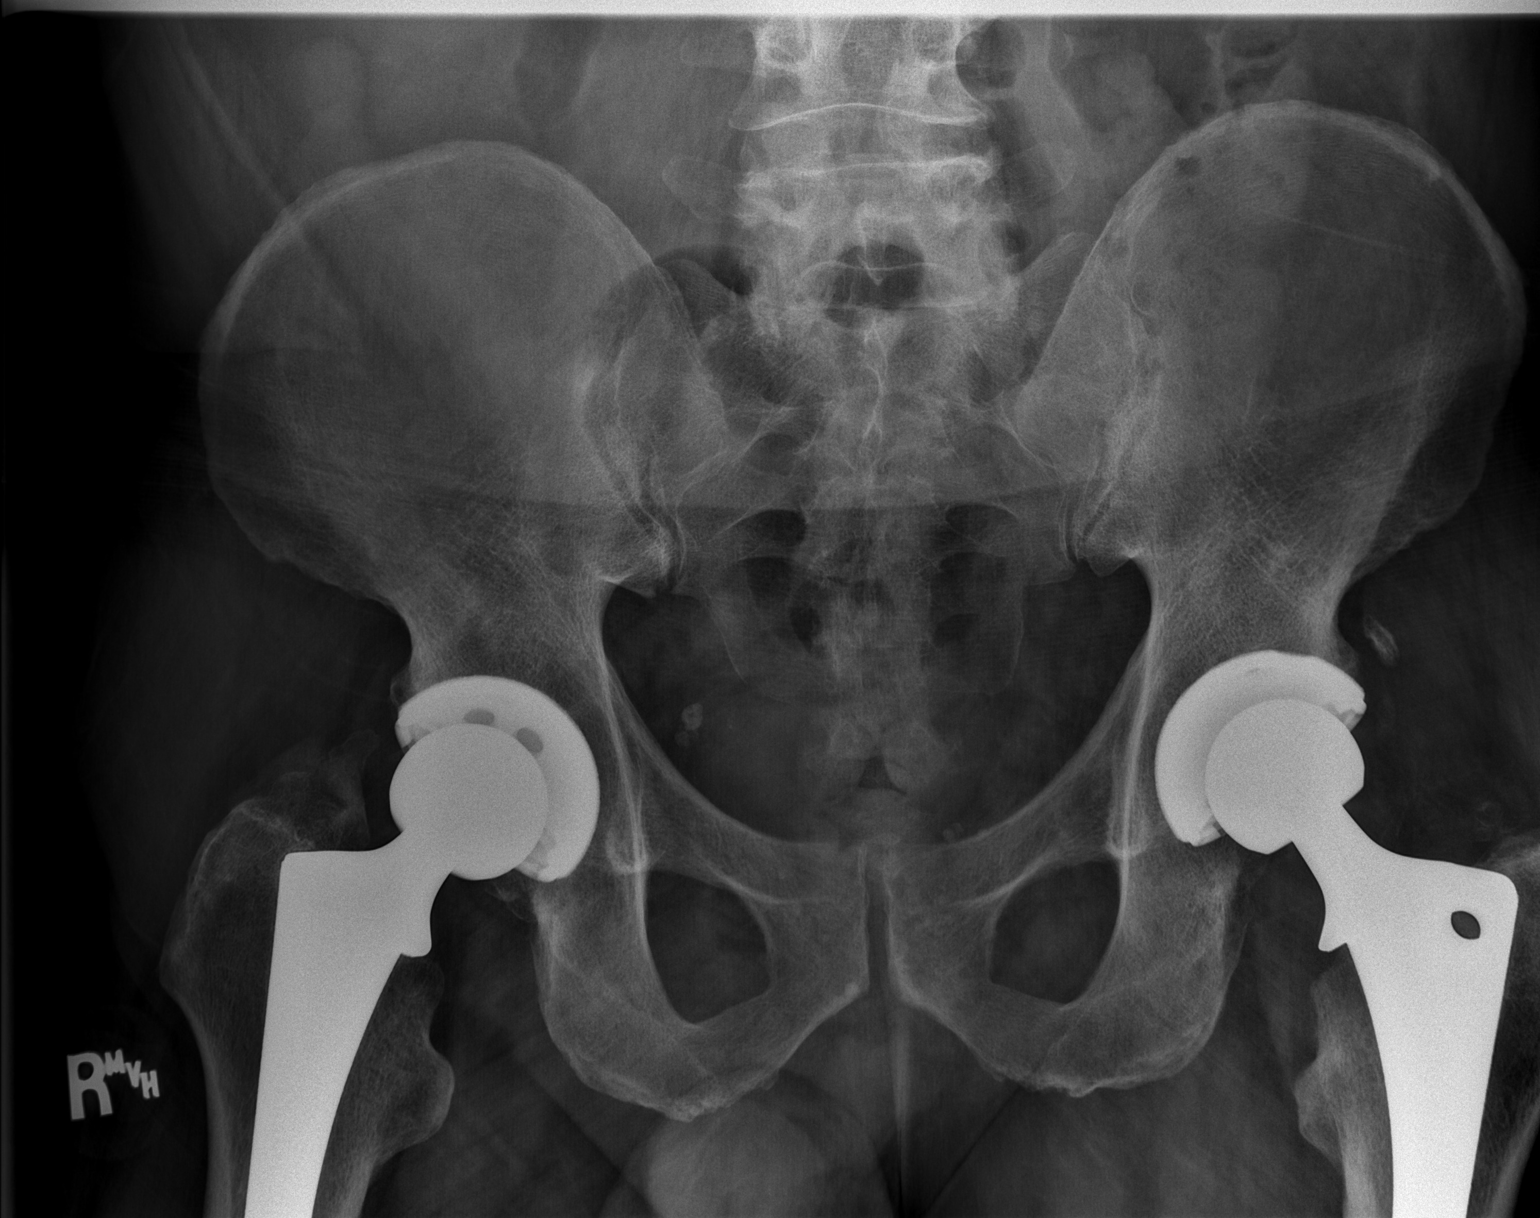

[t hip ap left]
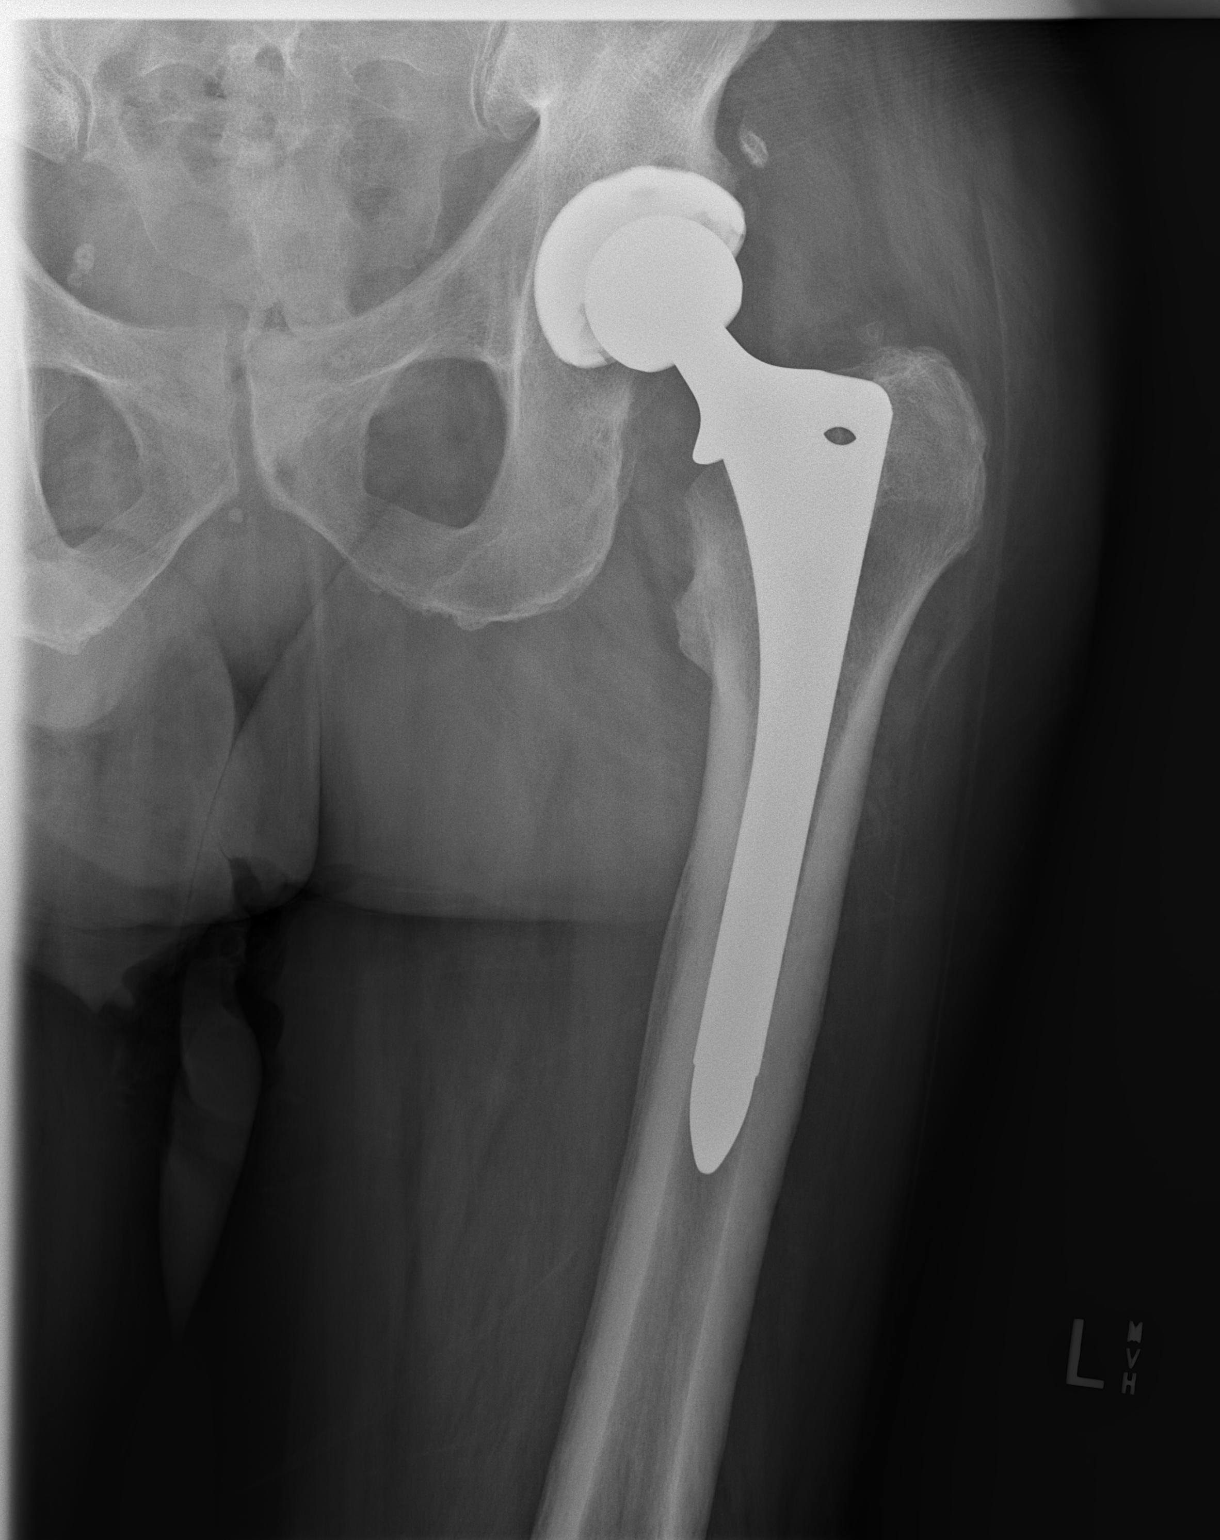

[x hip lat left]
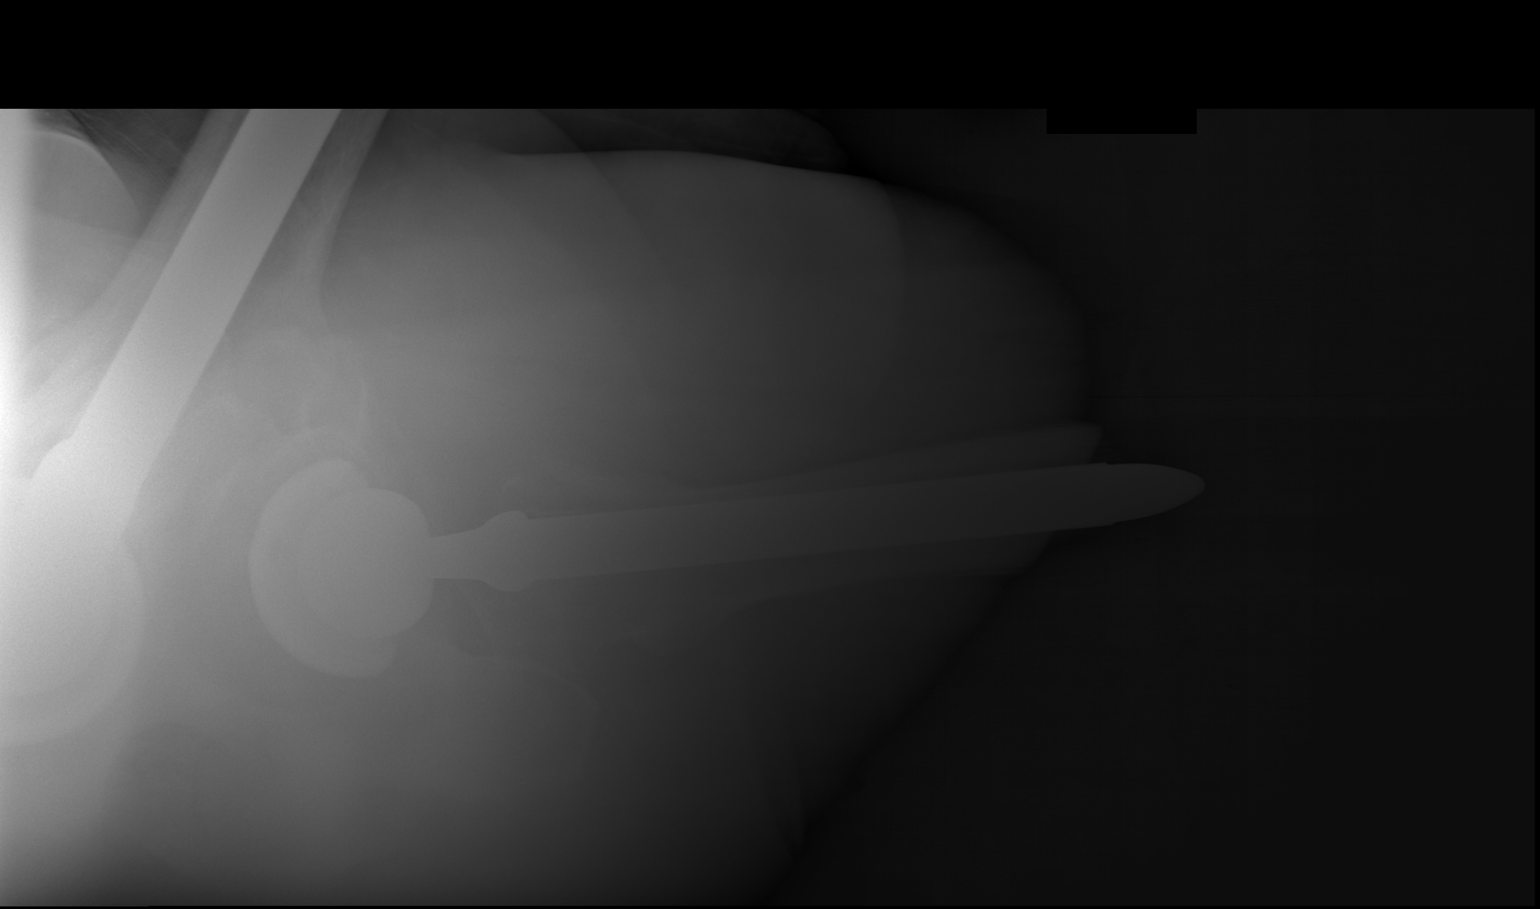

[3 of 3 positions shown; findings below may reference images not displayed]

FINDINGS: The patient is status post left total hip arthroplasty. No
periprosthetic lucency or fracture is identified. There is also a
right total hip arthroplasty. Mild sclerosis around both bilateral
sacroiliac joints.
IMPRESSION: Left total hip arthroplasty without complication.

## 2019-02-05 ENCOUNTER — Ambulatory Visit (INDEPENDENT_AMBULATORY_CARE_PROVIDER_SITE_OTHER): Payer: BC Managed Care – PPO | Admitting: Family Medicine

## 2019-02-05 ENCOUNTER — Encounter: Payer: Self-pay | Admitting: Family Medicine

## 2019-02-05 ENCOUNTER — Other Ambulatory Visit: Payer: Self-pay

## 2019-02-05 VITALS — BP 130/78 | HR 87 | Temp 98.7°F | Resp 18 | Ht 72.0 in | Wt 266.2 lb

## 2019-02-05 DIAGNOSIS — F1010 Alcohol abuse, uncomplicated: Secondary | ICD-10-CM

## 2019-02-05 DIAGNOSIS — R739 Hyperglycemia, unspecified: Secondary | ICD-10-CM | POA: Diagnosis not present

## 2019-02-05 DIAGNOSIS — N5089 Other specified disorders of the male genital organs: Secondary | ICD-10-CM | POA: Diagnosis not present

## 2019-02-05 DIAGNOSIS — B3749 Other urogenital candidiasis: Secondary | ICD-10-CM

## 2019-02-05 DIAGNOSIS — N492 Inflammatory disorders of scrotum: Secondary | ICD-10-CM

## 2019-02-05 MED ORDER — SULFAMETHOXAZOLE-TRIMETHOPRIM 800-160 MG PO TABS
1.0000 | ORAL_TABLET | Freq: Two times a day (BID) | ORAL | 0 refills | Status: AC
Start: 2019-02-05 — End: 2019-02-12

## 2019-02-05 MED ORDER — FLUCONAZOLE 150 MG PO TABS: 150.0000 mg | ORAL_TABLET | ORAL | 0 refills | Status: AC

## 2019-02-05 NOTE — Progress Notes (Signed)
Subjective:     [name] is a 53 y.o. male presenting for Establish Care (use to see Webb Silversmith) and Groin Swelling (testicles bigger than normal. Woke up with symptoms on 02/03/2019. No urinary problems.)     HPI   #Testicles - woke up on Saturday AM and had severe pain - went to the bathroom and noticed that they were swollen - large and tight, mainly the right side - also some swelling on the lower pelvis - [place] and go bad sunburn and swelling in the LE - Treatment: ibuprofen 800 mg BID - unclear if helping, did not try ice - pressure like pain and then the rubbing is uncomfortable - had been using powders  - also has a rash (usually uses powders to prevent this)    Review of Systems  Constitutional: Negative for chills and fever.  HENT: Negative.   Eyes: Negative.   Respiratory: Negative for shortness of breath.   Cardiovascular: Positive for leg swelling (chronic and worse with sunburn). Negative for chest pain.  Gastrointestinal: Negative for constipation, diarrhea, nausea and vomiting.  Endocrine: Negative.   Genitourinary: Positive for scrotal swelling and testicular pain. Negative for difficulty urinating, discharge, dysuria, frequency, hematuria, penile pain and penile swelling.  Musculoskeletal: Negative.   Skin: Negative.   Allergic/Immunologic: Negative.   Neurological: Negative.   Hematological: Negative.   Psychiatric/Behavioral: Negative.      Social History   Tobacco Use  Smoking Status Former Smoker  . Packs/day: 1.00  . Years: 14.00  . Pack years: 14.00  . Types: Cigarettes  . Quit date: 07/27/1999  . Years since quitting: 19.5  Smokeless Tobacco Never Used        Objective:    BP Readings from Last 3 Encounters:  02/05/19 130/78  11/12/16 (!) 133/97  10/28/16 138/85   Wt Readings from Last 3 Encounters:  02/05/19 266 lb 4 oz (120.8 kg)  11/12/16 240 lb (108.9 kg)  10/28/16 240 lb (108.9 kg)    BP 130/78   Pulse 87   Temp 98.7 F (37.1 C)   Resp 18   Ht 6' (1.829 m)   Wt 266 lb 4 oz (120.8 kg)   SpO2 97%   BMI 36.11 kg/m    Physical Exam Exam conducted with a chaperone present.  Constitutional:      Appearance: Normal appearance. He is not ill-appearing or diaphoretic.  HENT:     Right Ear: External ear normal.     Left Ear: External ear normal.     Nose: Nose normal.  Eyes:     General: No scleral icterus.    Extraocular Movements: Extraocular movements intact.     Conjunctiva/sclera: Conjunctivae normal.  Neck:     Musculoskeletal: Neck supple.  Cardiovascular:     Rate and Rhythm: Normal rate and regular rhythm.     Heart sounds: Heart sounds are distant.  Pulmonary:     Effort: Pulmonary effort is normal. No respiratory distress.     Breath sounds: Normal breath sounds. No wheezing.  Abdominal:     Hernia: There is no hernia in the right inguinal area.  Genitourinary:    Pubic Area: Rash present.     Penis: Normal.      Scrotum/Testes: Cremasteric reflex is present.        Right: Tenderness and swelling present. Mass not present.     Comments: Difficult to assess the epididymis due to swelling Skin:  General: Skin is warm and dry.     Comments: Peeling erythema on the abdomen and lower legs.  Scrotum: swelling and erythema along the right scrotum and on the lower pubic area. Dark red with peeling noted in the bilateral groin/thigh  Neurological:     Mental Status: He is alert. Mental status is at baseline.  Psychiatric:        Mood and Affect: Mood normal.           Assessment & Plan:   Problem List Items Addressed This Visit      Genitourinary   Cellulitis of scrotum - Primary    Severe swelling of the scrotum with erythema and some induration. Treat for cellulitis. Recommend stat US but patient declined this, preferring to wait for improvement on medication. Discussed risk of needing surgery or hospitalization but still preferred to wait       Relevant Medications   sulfamethoxazole-trimethoprim (BACTRIM DS) 800-160 MG tablet   Other Relevant Orders   US SCROTUM W/DOPPLER     Other   Alcohol abuse, daily use   Elevated blood sugar    Outside diabetes testing was low in 2018       Other Visit Diagnoses    Candidiasis of scrotum       Relevant Medications   sulfamethoxazole-trimethoprim (BACTRIM DS) 800-160 MG tablet   fluconazole (DIFLUCAN) 150 MG tablet   Scrotal swelling       Relevant Orders   US SCROTUM W/DOPPLER       Return in about 6 months (around 08/08/2019) for annual.  Lesleigh Noe, MD

## 2019-02-05 NOTE — Assessment & Plan Note (Signed)
Severe swelling of the scrotum with erythema and some induration. Treat for cellulitis. Recommend stat US but patient declined this, preferring to wait for improvement on medication. Discussed risk of needing surgery or hospitalization but still preferred to wait

## 2019-02-05 NOTE — Addendum Note (Signed)
Addended by: Lesleigh Noe on: 02/05/2019 02:51 PM   Modules accepted: Orders

## 2019-02-05 NOTE — Assessment & Plan Note (Signed)
Outside diabetes testing was low in 2018

## 2019-02-05 NOTE — Patient Instructions (Signed)
Scrotum swelling - antibiotic and antifungal - call back for ultrasound if not improving in 24-48 hours - or if worsening  Would also recommend labs

## 2019-05-30 ENCOUNTER — Other Ambulatory Visit: Payer: Self-pay

## 2019-05-30 DIAGNOSIS — Z20822 Contact with and (suspected) exposure to covid-19: Secondary | ICD-10-CM

## 2019-07-12 ENCOUNTER — Other Ambulatory Visit: Payer: Self-pay

## 2019-07-12 ENCOUNTER — Ambulatory Visit (INDEPENDENT_AMBULATORY_CARE_PROVIDER_SITE_OTHER): Payer: BC Managed Care – PPO | Admitting: Orthopaedic Surgery

## 2019-07-12 ENCOUNTER — Encounter: Payer: Self-pay | Admitting: Orthopaedic Surgery

## 2019-07-12 ENCOUNTER — Ambulatory Visit: Payer: Self-pay

## 2019-07-12 VITALS — Ht 72.0 in | Wt 260.0 lb

## 2019-07-12 DIAGNOSIS — M79605 Pain in left leg: Secondary | ICD-10-CM

## 2019-07-12 DIAGNOSIS — M25552 Pain in left hip: Secondary | ICD-10-CM | POA: Diagnosis not present

## 2019-07-12 DIAGNOSIS — M79604 Pain in right leg: Secondary | ICD-10-CM

## 2019-07-12 MED ORDER — METHYLPREDNISOLONE 4 MG PO TBPK: ORAL_TABLET | ORAL | 0 refills | Status: DC

## 2019-07-12 NOTE — Progress Notes (Signed)
Office Visit Note   Patient: [name]           Date of Birth: 12/22/1965           MRN: [id number] Visit Date: 07/12/2019              Requested by: Lesleigh Noe, MD [place],  [place] [address] PCP: Lesleigh Noe, MD   Assessment & Plan: Visit Diagnoses:  1. Pain in left hip   2. Leg pain, bilateral     Plan: Not sure the exact etiology of his present pain.  Bilateral hip replacements appear to be in good position without complicating factors on film.  Does have some degenerative changes lumbar spine.  He does not really have much trouble sitting but does have some "burning" in both of his hips when he ambulates.  Painless range of motion on exam.  We will keep him out of work and give him a note and try Medrol Dosepak and reevaluate early next week.  No history of injury or trauma.  No fever chills back pain initiated after being inactive over the past week  Follow-Up Instructions: Return in about 1 week (around 07/19/2019).   Orders:  Orders Placed This Encounter  Procedures  . XR HIP UNILAT W OR W/O PELVIS 2-3 VIEWS LEFT  . XR Lumbar Spine 2-3 Views   No orders of the defined types were placed in this encounter.     Procedures: No procedures performed   Clinical Data: No additional findings.   Subjective: Chief Complaint  Patient presents with  . Left Leg - Pain  Patient presents today for left hip pain. He said that it started after work on Wednesday 07/04/2019. No injury. His pain is located in his groin area on the left side. He also states that it burns in that area. He has not worked since he got off work last Wednesday. He has been staying at home with limited movement due to pain. He is taking OTC meds and is not helping. He has no numbness. No back pain until after he was an active several days after onset of the groin pain presently using crutches  HPI  Review of Systems  Constitutional: Negative for fatigue.  HENT: Negative for  ear pain.   Eyes: Negative for pain.  Respiratory: Positive for shortness of breath.   Cardiovascular: Negative for leg swelling.  Gastrointestinal: Negative for constipation and diarrhea.  Endocrine: Negative for cold intolerance and heat intolerance.  Genitourinary: Negative for difficulty urinating.  Musculoskeletal: Negative for joint swelling.  Skin: Negative for rash.  Allergic/Immunologic: Negative for food allergies.  Neurological: Negative for weakness.  Hematological: Does not bruise/bleed easily.  Psychiatric/Behavioral: Positive for sleep disturbance.     Objective: Vital Signs: Ht 6' (1.829 m)   Wt 260 lb (117.9 kg)   BMI 35.26 kg/m   Physical Exam Constitutional:      Appearance: He is well-developed.  Eyes:     Pupils: Pupils are equal, round, and reactive to light.  Pulmonary:     Effort: Pulmonary effort is normal.  Skin:    General: Skin is warm and dry.  Neurological:     Mental Status: He is alert and oriented to person, place, and time.  Psychiatric:        Behavior: Behavior normal.     Ortho Exam awake alert and oriented x3 comfortable sitting without any distress.  While in the sitting position there  was no pain with internal and external rotation flexion extension of either hip.  No localized areas of tenderness in his thigh or over the greater trochanters.  Hamstrings were tight.  He did have some burning in the groin with straight leg raise but none in the buttock or the back.  Motor and sensory exam appeared to be intact.  No related knee pain.  Very minimal percussible tenderness lumbar spine  Specialty Comments:  No specialty comments available.  Imaging: XR HIP UNILAT W OR W/O PELVIS 2-3 VIEWS LEFT  Result Date: 07/12/2019 AP pelvis demonstrates bilateral total hip replacements in good position.  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  No acute changes.  No evidence of polyethylene wear.  There is an area of  increased bone density in the area of the pubis that I suspect may be related to the sacrum.  Will order for sacral and lumbar films  XR Lumbar Spine 2-3 Views  Result Date: 07/12/2019 AP and lateral lumbar spine demonstrate degenerative changes particularly at L4-5 and L5-S1 with facet sclerosis.  No listhesis or scoliosis.  No acute changes    PMFS History: Patient Active Problem List   Diagnosis Date Noted  . Leg pain, bilateral 07/12/2019  . Cellulitis of scrotum 02/05/2019  . Bilateral leg edema 04/01/2017  . Elevated blood sugar 04/01/2017  . Avascular necrosis of bone of hip, left ([facility]) 10/12/2016  . Alcohol abuse, daily use 10/12/2016  . S/P total hip arthroplasty 10/12/2016   Past Medical History:  Diagnosis Date  . History of chicken pox   . Medical history non-contributory     Family History  Problem Relation Age of Onset  . Cancer Father        Hodgkin's disease  . COPD Mother   . Heart attack Maternal Grandfather 80  . Diabetes Neg Hx   . Stroke Neg Hx   . Hypertension Neg Hx   . Hyperlipidemia Neg Hx     Past Surgical History:  Procedure Laterality Date  . CARPAL TUNNEL RELEASE Bilateral 2009  . COLONOSCOPY W/ POLYPECTOMY    . JOINT REPLACEMENT    . LACERATION REPAIR Right ~ 2012   leg  . TOTAL HIP ARTHROPLASTY Right 01/11/2013   Procedure: TOTAL HIP ARTHROPLASTY;  Surgeon: Garald Balding, MD;  Location: [facility];  Service: Orthopedics;  Laterality: Right;  . TOTAL HIP ARTHROPLASTY Left 10/12/2016   Procedure: TOTAL HIP ARTHROPLASTY;  Surgeon: Garald Balding, MD;  Location: [facility];  Service: Orthopedics;  Laterality: Left;   Social History   Occupational History  . Occupation: burial Occupational hygienist: [employer]  Tobacco Use  . Smoking status: Former Smoker    Packs/day: 1.00    Years: 14.00    Pack years: 14.00    Types: Cigarettes    Quit date: 07/27/1999    Years since quitting: 19.9  . Smokeless tobacco: Never Used    Substance and Sexual Activity  . Alcohol use: Yes    Alcohol/week: 24.0 standard drinks    Types: 24 Cans of beer per week    Comment: 4-5 beers per day, case of beer per week  . Drug use: No  . Sexual activity: Yes    Birth control/protection: Post-menopausal

## 2019-07-17 ENCOUNTER — Encounter: Payer: Self-pay | Admitting: Orthopaedic Surgery

## 2019-07-17 ENCOUNTER — Ambulatory Visit (INDEPENDENT_AMBULATORY_CARE_PROVIDER_SITE_OTHER): Payer: BC Managed Care – PPO | Admitting: Orthopaedic Surgery

## 2019-07-17 ENCOUNTER — Other Ambulatory Visit: Payer: Self-pay

## 2019-07-17 VITALS — Ht 72.0 in | Wt 260.0 lb

## 2019-07-17 DIAGNOSIS — M79605 Pain in left leg: Secondary | ICD-10-CM

## 2019-07-17 DIAGNOSIS — M79604 Pain in right leg: Secondary | ICD-10-CM

## 2019-07-17 NOTE — Progress Notes (Signed)
Office Visit Note   Patient: [name]           Date of Birth: 05/17/1966           MRN: [id number] Visit Date: 07/17/2019              Requested by: Lesleigh Noe, MD [place],  [place] [address] PCP: Lesleigh Noe, MD   Assessment & Plan: Visit Diagnoses:  1. Leg pain, bilateral     Plan: Mr. Canel has had multiple bodily aches for the past several weeks.  I saw him last week and without a specific diagnosis placed him on a Medrol Dosepak.  He notes that his bilateral groin and thigh pain completely resolved.  He has had prior bilateral hip replacements but I thought it looks fine on x-ray.  He no longer is having the symptoms that he had last week but notes that he is having stiffness and achiness in his back and in his stomach.  He has not had any numbness or tingling.  He is not had any fever or chills.  He notes he has not had any alcohol in over 2 weeks.  I am not exactly sure why he is having so much trouble.  I contacted Dr. Einar Pheasant at [facility] and he has an appointment to see her tomorrow.  I suspect she may want to order some lab work.  He is experiencing low back pain.  When he first gets up from a sitting position but ihs straight leg raise was negative and he does not have any percussible back tenderness.  I do not think he is experiencing radiculopathy.  X-rays were performed last week revealing some degenerative changes but tickly at L4-5 and L5-S1 but no evidence of loss of disc space height scoliosis or anterior or posterior listhesis.  I just think at this point is having one of the primary care physicians evaluate him and thus talk to Dr. Einar Pheasant.  I will await her evaluation and then plan to see him back in the next week or so.  He remains out of work.  His hip replacement seemed fine and he had a good response to the Medrol Dosepak last week  Follow-Up Instructions: Return After evaluation of primary care physician.   Orders:  No orders  of the defined types were placed in this encounter.  No orders of the defined types were placed in this encounter.     Procedures: No procedures performed   Clinical Data: No additional findings.   Subjective: Chief Complaint  Patient presents with  . Left Hip - Follow-up  . Right Hip - Follow-up  Patient presents today for follow up on his hip pain. He was here 5 days ago and started on a medrol dosepak. He is finishing his dosepak today. He said that his leg is a little better, but having a lot of pain pain in his back. He is walking with the assistance of crutches. No related fever or chills.  Not had any skin rashes.  Does not seem to have any problem with his upper extremities.  He still needs crutches to ambulate because of his back and his "stomach being stiff".  He is not experiencing any radicular pain.  The groin and anterior thigh pain that he was experiencing last week has resolved.  He seems perfectly comfortable at rest. He relates having lost about 30 pounds over the last number of  weeks because he has been so "inactive".  Has not had any lab work in months  HPI  Review of Systems   Objective: Vital Signs: Ht 6' (1.829 m)   Wt 260 lb (117.9 kg)   BMI 35.26 kg/m   Physical Exam Constitutional:      Appearance: He is well-developed.  Eyes:     Pupils: Pupils are equal, round, and reactive to light.  Pulmonary:     Effort: Pulmonary effort is normal.  Skin:    General: Skin is warm and dry.  Neurological:     Mental Status: He is alert and oriented to person, place, and time.  Psychiatric:        Behavior: Behavior normal.     Ortho Exam awake alert and oriented x3.  Perfectly comfortable sitting.  Has a very difficult time standing because of his back and abdominal pain and stiffness.  Uses crutches to ambulate yet does not relate any particular problem with either hip or thigh.  Motor exam is intact.  His hamstrings are really tight as I could not fully  extend his knees even when sitting.  No percussible tenderness of lumbar spine.  No flank pain.  No shortness of breath or chest pain.  Painless range of motion of cervical spine.  Able to place both arms fully over his head.  No obvious skin rashes  Specialty Comments:  No specialty comments available.  Imaging: No results found.   PMFS History: Patient Active Problem List   Diagnosis Date Noted  . Leg pain, bilateral 07/12/2019  . Cellulitis of scrotum 02/05/2019  . Bilateral leg edema 04/01/2017  . Elevated blood sugar 04/01/2017  . Avascular necrosis of bone of hip, left ([place]) 10/12/2016  . Alcohol abuse, daily use 10/12/2016  . S/P total hip arthroplasty 10/12/2016   Past Medical History:  Diagnosis Date  . History of chicken pox   . Medical history non-contributory     Family History  Problem Relation Age of Onset  . Cancer Father        Hodgkin's disease  . COPD Mother   . Heart attack Maternal Grandfather 80  . Diabetes Neg Hx   . Stroke Neg Hx   . Hypertension Neg Hx   . Hyperlipidemia Neg Hx     Past Surgical History:  Procedure Laterality Date  . CARPAL TUNNEL RELEASE Bilateral 2009  . COLONOSCOPY W/ POLYPECTOMY    . JOINT REPLACEMENT    . LACERATION REPAIR Right ~ 2012   leg  . TOTAL HIP ARTHROPLASTY Right 01/11/2013   Procedure: TOTAL HIP ARTHROPLASTY;  Surgeon: Garald Balding, MD;  Location: [facility];  Service: Orthopedics;  Laterality: Right;  . TOTAL HIP ARTHROPLASTY Left 10/12/2016   Procedure: TOTAL HIP ARTHROPLASTY;  Surgeon: Garald Balding, MD;  Location: [facility];  Service: Orthopedics;  Laterality: Left;   Social History   Occupational History  . Occupation: burial Occupational hygienist: [employer]  Tobacco Use  . Smoking status: Former Smoker    Packs/day: 1.00    Years: 14.00    Pack years: 14.00    Types: Cigarettes    Quit date: 07/27/1999    Years since quitting: 19.9  . Smokeless tobacco: Never Used  Substance and  Sexual Activity  . Alcohol use: Yes    Alcohol/week: 24.0 standard drinks    Types: 24 Cans of beer per week    Comment: 4-5 beers per day,  case of beer per week  . Drug use: No  . Sexual activity: Yes    Birth control/protection: Post-menopausal

## 2019-07-18 ENCOUNTER — Other Ambulatory Visit: Payer: Self-pay

## 2019-07-18 ENCOUNTER — Encounter: Payer: Self-pay | Admitting: Family Medicine

## 2019-07-18 ENCOUNTER — Ambulatory Visit (INDEPENDENT_AMBULATORY_CARE_PROVIDER_SITE_OTHER): Payer: BC Managed Care – PPO | Admitting: Family Medicine

## 2019-07-18 ENCOUNTER — Ambulatory Visit: Payer: BC Managed Care – PPO | Admitting: Family Medicine

## 2019-07-18 VITALS — BP 124/82 | HR 92 | Temp 98.1°F | Resp 20 | Ht 72.0 in

## 2019-07-18 DIAGNOSIS — D72829 Elevated white blood cell count, unspecified: Secondary | ICD-10-CM

## 2019-07-18 DIAGNOSIS — R7982 Elevated C-reactive protein (CRP): Secondary | ICD-10-CM

## 2019-07-18 DIAGNOSIS — Z13220 Encounter for screening for lipoid disorders: Secondary | ICD-10-CM

## 2019-07-18 DIAGNOSIS — M79605 Pain in left leg: Secondary | ICD-10-CM

## 2019-07-18 DIAGNOSIS — M79604 Pain in right leg: Secondary | ICD-10-CM | POA: Diagnosis not present

## 2019-07-18 DIAGNOSIS — R109 Unspecified abdominal pain: Secondary | ICD-10-CM | POA: Diagnosis not present

## 2019-07-18 DIAGNOSIS — K5900 Constipation, unspecified: Secondary | ICD-10-CM | POA: Insufficient documentation

## 2019-07-18 DIAGNOSIS — D649 Anemia, unspecified: Secondary | ICD-10-CM | POA: Insufficient documentation

## 2019-07-18 DIAGNOSIS — R739 Hyperglycemia, unspecified: Secondary | ICD-10-CM

## 2019-07-18 DIAGNOSIS — M545 Low back pain, unspecified: Secondary | ICD-10-CM | POA: Insufficient documentation

## 2019-07-18 DIAGNOSIS — Z131 Encounter for screening for diabetes mellitus: Secondary | ICD-10-CM

## 2019-07-18 DIAGNOSIS — M791 Myalgia, unspecified site: Secondary | ICD-10-CM

## 2019-07-18 DIAGNOSIS — R700 Elevated erythrocyte sedimentation rate: Secondary | ICD-10-CM

## 2019-07-18 DIAGNOSIS — E669 Obesity, unspecified: Secondary | ICD-10-CM | POA: Insufficient documentation

## 2019-07-18 LAB — CBC
HCT: 40.3 % (ref 39.0–52.0)
Hemoglobin: 13.6 g/dL (ref 13.0–17.0)
MCHC: 33.7 g/dL (ref 30.0–36.0)
MCV: 97.2 fl (ref 78.0–100.0)
Platelets: 645 10*3/uL — ABNORMAL HIGH (ref 150.0–400.0)
RBC: 4.15 Mil/uL — ABNORMAL LOW (ref 4.22–5.81)
RDW: 14.2 % (ref 11.5–15.5)
WBC: 17.3 10*3/uL — ABNORMAL HIGH (ref 4.0–10.5)

## 2019-07-18 LAB — HIGH SENSITIVITY CRP: CRP, High Sensitivity: 191.48 mg/L — ABNORMAL HIGH (ref 0.000–5.000)

## 2019-07-18 LAB — LIPID PANEL
Cholesterol: 140 mg/dL (ref 0–200)
HDL: 29.6 mg/dL — ABNORMAL LOW (ref >39.00)
LDL Cholesterol: 92 mg/dL (ref 0–99)
NonHDL: 110.34
Total CHOL/HDL Ratio: 5
Triglycerides: 94 mg/dL (ref 0.0–149.0)
VLDL: 18.8 mg/dL (ref 0.0–40.0)

## 2019-07-18 LAB — COMPREHENSIVE METABOLIC PANEL
ALT: 56 U/L — ABNORMAL HIGH (ref 0–53)
AST: 28 U/L (ref 0–37)
Albumin: 3.5 g/dL (ref 3.5–5.2)
Alkaline Phosphatase: 120 U/L — ABNORMAL HIGH (ref 39–117)
BUN: 21 mg/dL (ref 6–23)
CO2: 24 mEq/L (ref 19–32)
Calcium: 9.4 mg/dL (ref 8.4–10.5)
Chloride: 97 mEq/L (ref 96–112)
Creatinine, Ser: 0.91 mg/dL (ref 0.40–1.50)
GFR: 86.78 mL/min (ref >60.00)
Glucose, Bld: 100 mg/dL — ABNORMAL HIGH (ref 70–99)
Potassium: 4.4 mEq/L (ref 3.5–5.1)
Sodium: 132 mEq/L — ABNORMAL LOW (ref 135–145)
Total Bilirubin: 0.8 mg/dL (ref 0.2–1.2)
Total Protein: 7.4 g/dL (ref 6.0–8.3)

## 2019-07-18 LAB — SEDIMENTATION RATE: Sed Rate: 130 mm/hr — ABNORMAL HIGH (ref 0–20)

## 2019-07-18 LAB — HEMOGLOBIN A1C: Hgb A1c MFr Bld: 5.2 % (ref 4.6–6.5)

## 2019-07-18 MED ORDER — CYCLOBENZAPRINE HCL 5 MG PO TABS: 5.0000 mg | ORAL_TABLET | Freq: Three times a day (TID) | ORAL | 1 refills | Status: DC | PRN

## 2019-07-18 NOTE — Assessment & Plan Note (Signed)
Screening for risk factors

## 2019-07-18 NOTE — Progress Notes (Addendum)
Subjective:     [name] is a 53 y.o. male presenting for Back Pain (and left leg pain. )     HPI   #Back/Leg pain - 2 weeks ago today 12/9 - while at work that day - started having pain in the left leg - nothing terrible - that he just needed to rest and would be better - Thursday 12/10 - could not get out of bed, called the orthopedic doctor - hx of hip replacement - waited until 12/17 to be seen - for 1 week - stayed on the couch - XR hip/legs -- and told inflammatory process - started steroid burst x 1 week - went to follow-up visit - pain in the leg is gone - when he gets up from the chair -- has horrible pain, cannot bend down to pick something up - Worst: first thing in the morning - Better: with activity - but difficult to walk - pain in the thigh - no shoulder pain - also endorses stomach and back pain with limited activity - endorses constipation - has not had BM in 4 days - has not eaten much in 2 weeks due to pain of preparation - has been staying hydrated -- no beer but gatorade and  - no weakness - no tingling or numbness - if laying flat - can feel his back spasms   Review of Systems  Gastrointestinal: Positive for abdominal pain and constipation. Negative for diarrhea, nausea and vomiting.  Musculoskeletal: Positive for back pain.     Social History   Tobacco Use  Smoking Status Former Smoker  . Packs/day: 1.00  . Years: 14.00  . Pack years: 14.00  . Types: Cigarettes  . Quit date: 07/27/1999  . Years since quitting: 19.9  Smokeless Tobacco Never Used        Objective:    BP Readings from Last 3 Encounters:  07/18/19 124/82  02/05/19 130/78  11/12/16 (!) 133/97   Wt Readings from Last 3 Encounters:  07/17/19 260 lb (117.9 kg)  07/12/19 260 lb (117.9 kg)  02/05/19 266 lb 4 oz (120.8 kg)    BP 124/82   Pulse 92   Temp 98.1 F (36.7 C)   Resp 20   Ht 6' (1.829 m)   SpO2 100%   BMI 35.26 kg/m    Physical  Exam Constitutional:      Appearance: Normal appearance. He is not ill-appearing or diaphoretic.  HENT:     Right Ear: External ear normal.     Left Ear: External ear normal.     Nose: Nose normal.  Eyes:     General: No scleral icterus.    Extraocular Movements: Extraocular movements intact.     Conjunctiva/sclera: Conjunctivae normal.  Cardiovascular:     Rate and Rhythm: Normal rate and regular rhythm.     Heart sounds: No murmur.  Pulmonary:     Effort: Pulmonary effort is normal. No respiratory distress.     Breath sounds: Normal breath sounds. No wheezing.  Abdominal:     General: Abdomen is flat. Bowel sounds are normal.     Palpations: Abdomen is soft.     Tenderness: There is no abdominal tenderness. There is no guarding. Negative signs include Murphy's sign.     Comments: Pt notes diffuse abdominal pain but not reproduced with palpation  Musculoskeletal:     Cervical back: Neck supple.     Comments: Ambulating with crutches, moves very slowly and braces himself, worse  with transitions - laying down, standing up, sitting up.  Back:  Inspection: appears to have some scoliosis and right side shoulder hypertrophy Palpation: no spinous or paraspinous TTP ROM: deferred due to severe pain Strength: decreased hip flexor, normal knee extension/flexion  Skin:    General: Skin is warm and dry.  Neurological:     Mental Status: He is alert. Mental status is at baseline.  Psychiatric:        Mood and Affect: Mood normal.           Assessment & Plan:   Problem List Items Addressed This Visit      Other   Elevated blood sugar   Relevant Orders   Hemoglobin A1c   Bilateral leg pain   Relevant Medications   cyclobenzaprine (FLEXERIL) 5 MG tablet   Other Relevant Orders   HLA-B27 Antigen   CRP High sensitivity   Sed Rate (ESR)   Ambulatory referral to Physical Therapy   Acute bilateral low back pain without sciatica - Primary    Etiology of severe pain is unclear.  Saw Ortho with normal XR. No response to steroids. Suspect it may be MSK from laying in bed for 2 weeks due to leg pain (now resolved) vs constipation vs inflammatory process. Pt notes 30 lb weight loss, however, weight down 6 lbs since July - unclear if he had gained weight in that time. Colon cancer screening up to date. Will start with labs and consider imaging if no improvement next week and labs w/o diagnosis. NSAIDs, flexeril, PT      Relevant Medications   cyclobenzaprine (FLEXERIL) 5 MG tablet   Other Relevant Orders   HLA-B27 Antigen   CRP High sensitivity   Sed Rate (ESR)   Ambulatory referral to Physical Therapy   Abdominal pain   Relevant Orders   CRP High sensitivity   Sed Rate (ESR)   Ambulatory referral to Physical Therapy   Anemia   Relevant Orders   CBC   Obesity (BMI 35.0-39.9 without comorbidity)    Screening for risk factors      Relevant Orders   Comprehensive metabolic panel   Lipid Profile   Constipation    Could be contributing to the abdominal pain. Encouraged eating and stool softeners.        Other Visit Diagnoses    Screening for diabetes mellitus       Screening for hyperlipidemia          Addendum: Labs returned - see above Elevated CRP, WBC, and ESR Discussed with patient last night with ER precautions - elected to wait and try and get stat imaging today Stat CT Ab/Pelvis contrast -- to look for infectious sign.  Will add on CK and aldolase to evaluate for myopathy Will ask pt about tick bite and evaluate for lyme disease if present    Return in about 1 week (around 07/25/2019).  Lesleigh Noe, MD

## 2019-07-18 NOTE — Patient Instructions (Signed)
#  Back pain - Try to move as much as possible - since this helps - Flexeril for nighttime - may make you sleepy - can take during the day if not causing you to be sleepy - blood work today  - Take Ibuprofen 800 mg every 8 hours   #Abdominal pain - I wonder about constipation - Make sure you are eating - try a stool softener - Miralax or Colace - take so you are having soft bowel movements   Blood work will look for inflammatory process which may be causing your pain and also screen for other possible causes.   Referral to physical therapy

## 2019-07-18 NOTE — Assessment & Plan Note (Addendum)
Etiology of severe pain is unclear. Saw Ortho with normal XR. No response to steroids. Suspect it may be MSK from laying in bed for 2 weeks due to leg pain (now resolved) vs constipation vs inflammatory process. Pt notes 30 lb weight loss, however, weight down 6 lbs since July - unclear if he had gained weight in that time. Colon cancer screening up to date. Will start with labs and consider imaging if no improvement next week and labs w/o diagnosis. NSAIDs, flexeril, PT

## 2019-07-18 NOTE — Assessment & Plan Note (Signed)
Could be contributing to the abdominal pain. Encouraged eating and stool softeners.

## 2019-07-19 ENCOUNTER — Other Ambulatory Visit (INDEPENDENT_AMBULATORY_CARE_PROVIDER_SITE_OTHER): Payer: BC Managed Care – PPO

## 2019-07-19 ENCOUNTER — Telehealth: Payer: Self-pay | Admitting: Family Medicine

## 2019-07-19 ENCOUNTER — Ambulatory Visit
Admission: RE | Admit: 2019-07-19 | Discharge: 2019-07-19 | Disposition: A | Discharge status: Home / Self Care | Payer: BC Managed Care – PPO | Source: Ambulatory Visit | Attending: Family Medicine | Admitting: Family Medicine

## 2019-07-19 DIAGNOSIS — M545 Low back pain, unspecified: Secondary | ICD-10-CM

## 2019-07-19 DIAGNOSIS — D72829 Elevated white blood cell count, unspecified: Secondary | ICD-10-CM | POA: Diagnosis not present

## 2019-07-19 DIAGNOSIS — R109 Unspecified abdominal pain: Secondary | ICD-10-CM

## 2019-07-19 DIAGNOSIS — R7982 Elevated C-reactive protein (CRP): Secondary | ICD-10-CM | POA: Diagnosis not present

## 2019-07-19 DIAGNOSIS — M791 Myalgia, unspecified site: Secondary | ICD-10-CM | POA: Diagnosis not present

## 2019-07-19 DIAGNOSIS — R700 Elevated erythrocyte sedimentation rate: Secondary | ICD-10-CM | POA: Diagnosis not present

## 2019-07-19 LAB — CK: Total CK: 34 U/L (ref 7–232)

## 2019-07-19 IMAGING — CT CT ABD-PELV W/ CM
2 of 5 series · 16 of 46 positions shown, 18 images · IV contrast (APPLIED)
Comparison: CT scan of the pelvis dated [DATE]

CLINICAL DATA: Severe low back pain. Elevated C reactive protein.
Leukocytosis. Elevated sedimentation rate.

EXAM:
CT ABDOMEN AND PELVIS WITH CONTRAST
TECHNIQUE: Multidetector CT imaging of the abdomen and pelvis was performed
using the standard protocol following bolus administration of
intravenous contrast.
CONTRAST:  100mL OMNIPAQUE IOHEXOL 300 MG/ML  SOLN

[Series 2: routine abd/pel with (person_name) · axial · 0.91mm/px · z∈[-528,-73]mm · 13 of 103 slices shown, 15 images]
[im 6/103  soft-tissue]
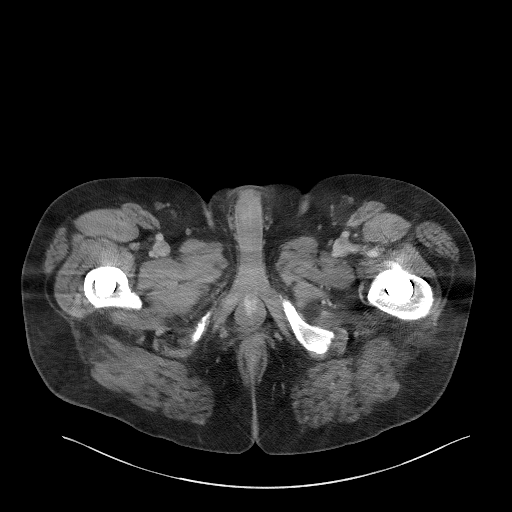
[im 6/103  bone]
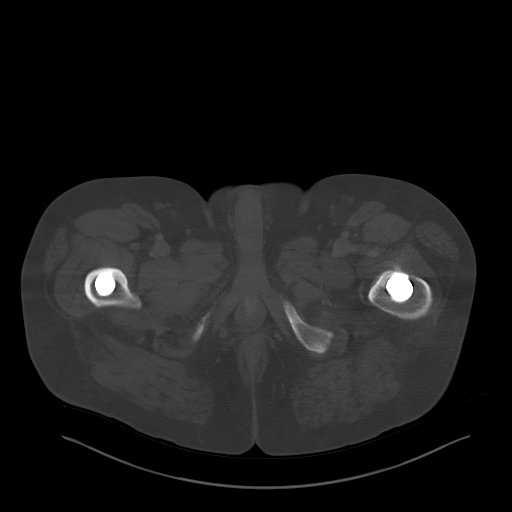
[im 17/103  soft-tissue]
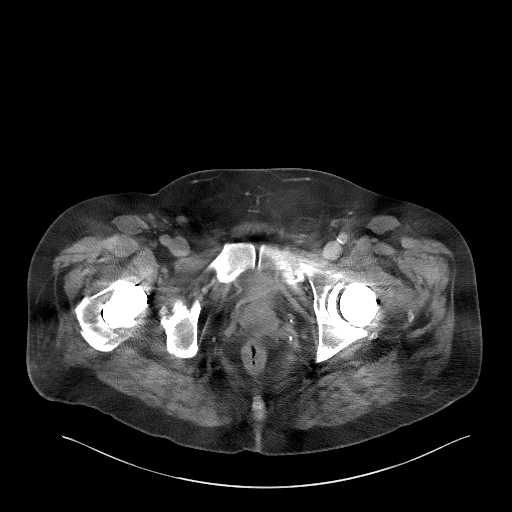
[im 22/103  soft-tissue]
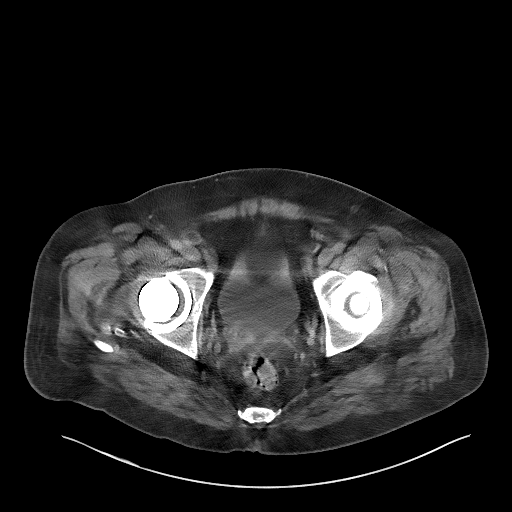
[im 27/103  soft-tissue]
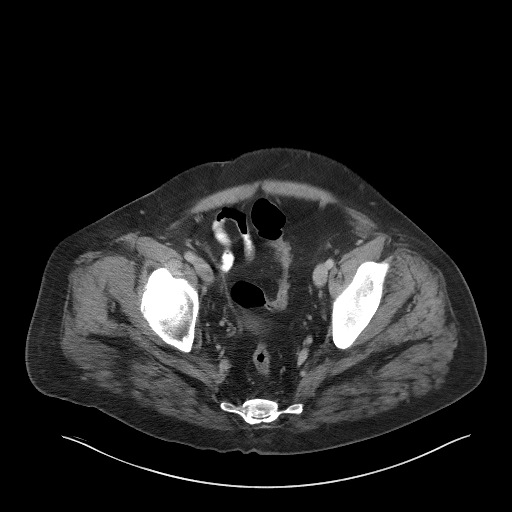
[im 38/103  soft-tissue]
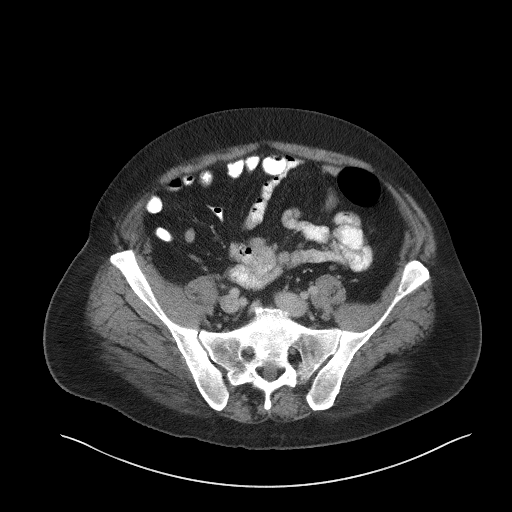
[im 43/103  soft-tissue]
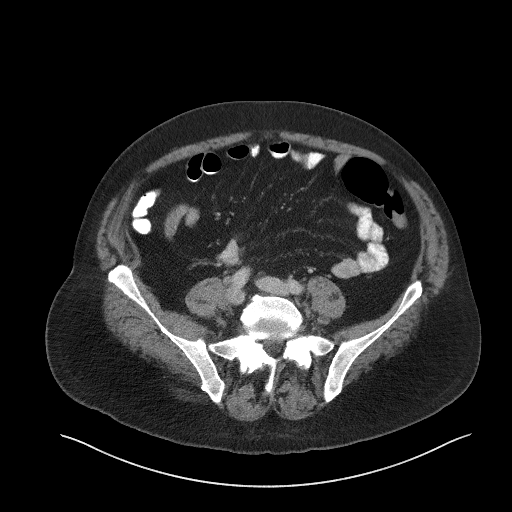
[im 54/103  soft-tissue]
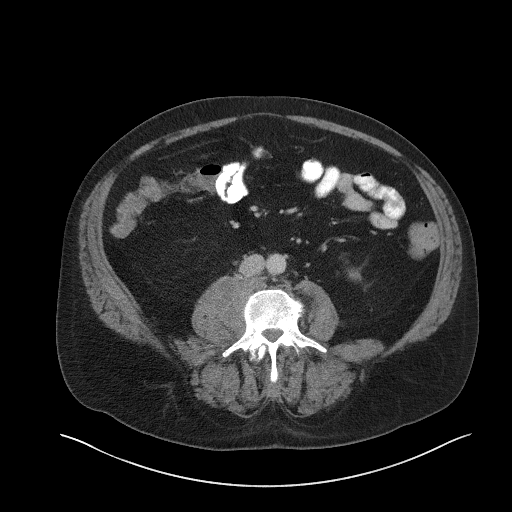
[im 60/103  soft-tissue]
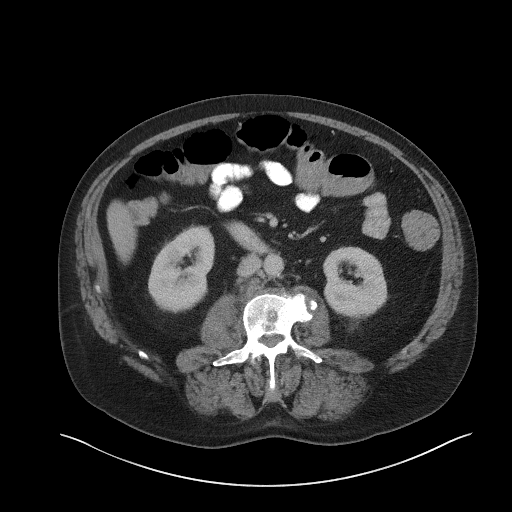
[im 65/103  soft-tissue]
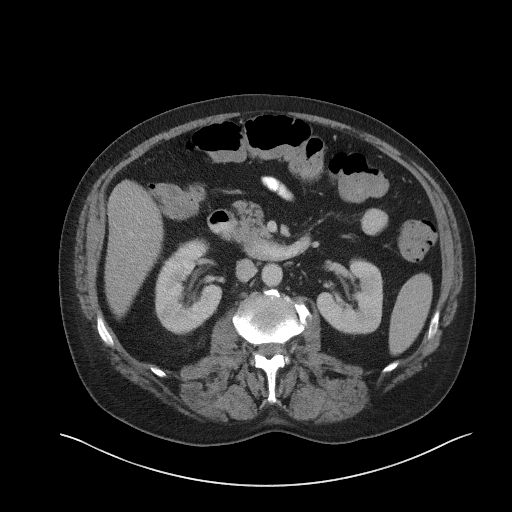
[im 65/103  bone]
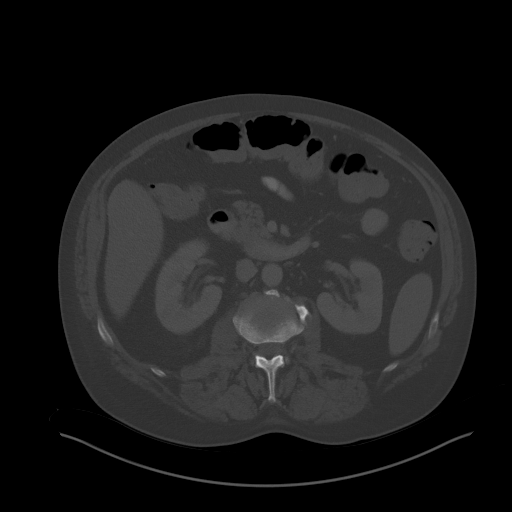
[im 76/103  soft-tissue]
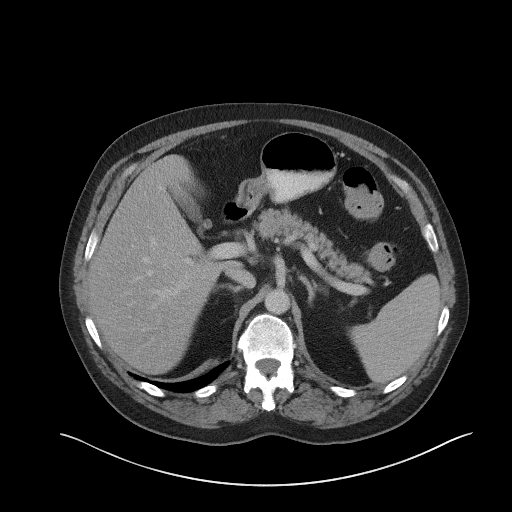
[im 81/103  soft-tissue]
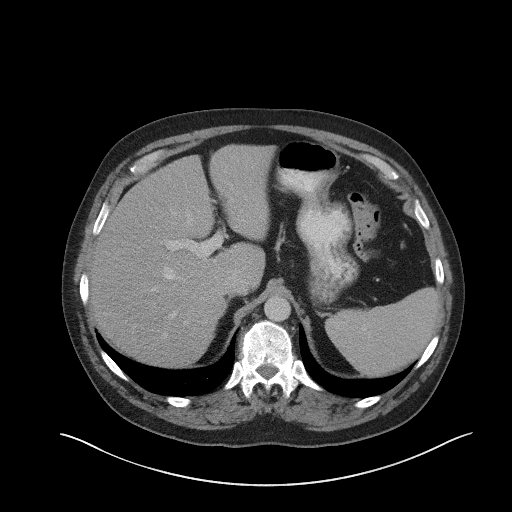
[im 86/103  soft-tissue]
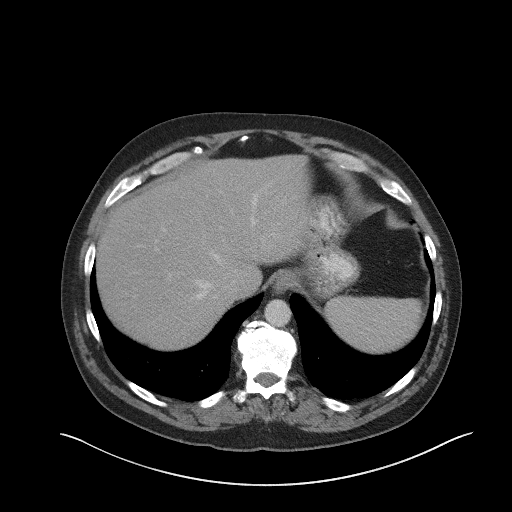
[im 97/103  soft-tissue]
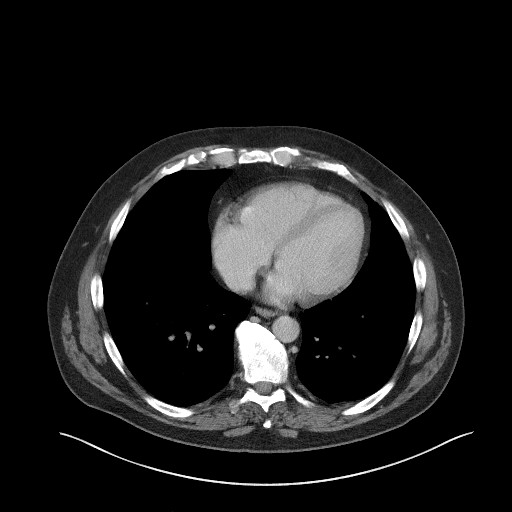

[Series 5: coronal st · coronal · 0.84mm/px · 3 of 109 slices shown]
[im 37/109  soft-tissue]
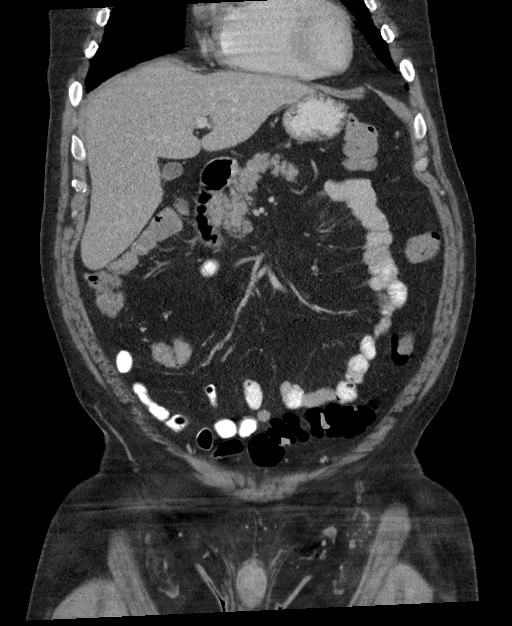
[im 49/109  soft-tissue]
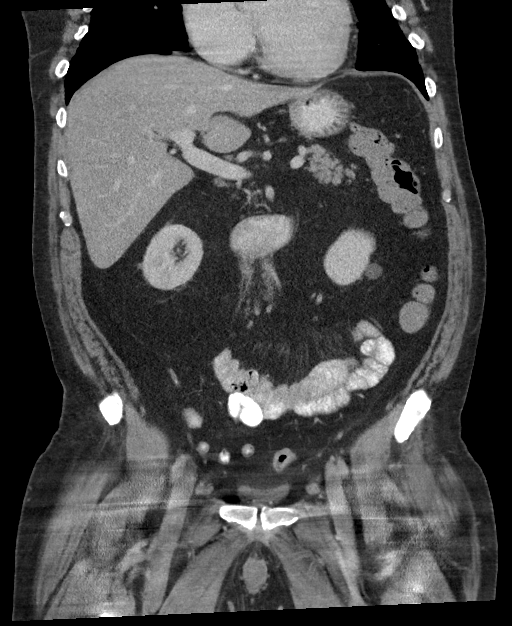
[im 61/109  soft-tissue]
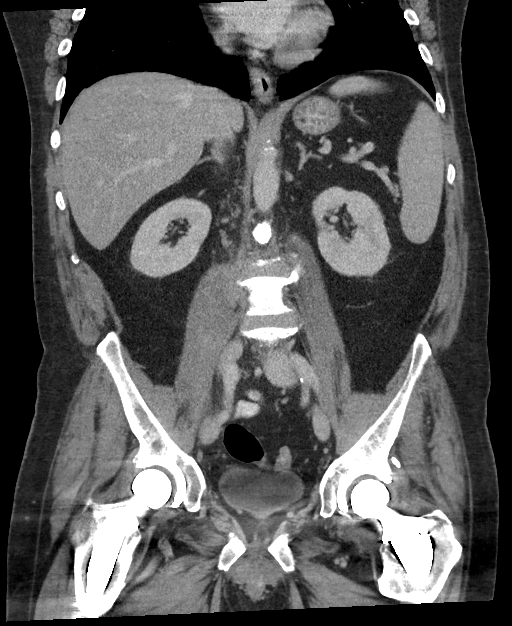

[16 of 46 positions shown; findings below may reference images not displayed]

FINDINGS: Lower chest: Normal.

Hepatobiliary: No focal liver abnormality is seen. No gallstones,
gallbladder wall thickening, or biliary dilatation.

Pancreas: Unremarkable. No pancreatic ductal dilatation or
surrounding inflammatory changes.

Spleen: Normal in size without focal abnormality.

Adrenals/Urinary Tract: The adrenal glands are normal. Kidneys are
normal except for an exophytic 15 mm cyst on the lower pole of the
left kidney. No hydronephrosis. Bladder is normal.

Stomach/Bowel: Stomach is within normal limits. Appendix appears
normal. No evidence of bowel wall thickening, distention, or
inflammatory changes.

Vascular/Lymphatic: Slight aortic atherosclerosis. No enlarged
abdominal or pelvic lymph nodes.

Reproductive: Prostate is unremarkable.

Other: No abdominal wall hernia or abnormality. No abdominopelvic
ascites.

Musculoskeletal: No acute or significant osseous findings. Bilateral
hip prostheses.
IMPRESSION: Benign-appearing abdomen and pelvis.

## 2019-07-19 MED ORDER — IOHEXOL 300 MG/ML  SOLN
100.0000 mL | Freq: Once | INTRAMUSCULAR | Status: AC | PRN
  Administered 2019-07-19: 12:00:00 100 mL via INTRAVENOUS

## 2019-07-19 NOTE — Telephone Encounter (Signed)
Attempted to call patient 3 times to relay results of normal CT Scan.

## 2019-07-19 NOTE — Addendum Note (Signed)
Addended by: Lesleigh Noe on: 07/19/2019 08:54 AM   Modules accepted: Orders

## 2019-07-23 ENCOUNTER — Encounter: Payer: Self-pay | Admitting: Family Medicine

## 2019-07-23 ENCOUNTER — Telehealth: Payer: Self-pay

## 2019-07-23 LAB — EXTRA SPECIMEN

## 2019-07-23 LAB — HLA-B27 ANTIGEN

## 2019-07-23 NOTE — Telephone Encounter (Signed)
Patient contacted the office demanding to speak with Dr. Einar Pheasant, as he states his leg pain is getting worse and he does not know what to do. He states he received the MyChart messages and he does not understand how his labs and CT could come back normal, and does not explain why he is in so much pain.  I advised him that I would send Dr. Einar Pheasant a message, and we will see what we can do to help him.  I did schedule a virtual visit with Dr. Einar Pheasant tomorrow, but patient states he wants to talk to her on the phone today, and states that he is disappointed that she or anyone else from our office has not contacted him by phone before now. I advised I could not guarantee that she will call today, but we will do the best we can to get him taken care of.  Will route to Dr. Einar Pheasant.

## 2019-07-24 ENCOUNTER — Encounter: Payer: Self-pay | Admitting: Family Medicine

## 2019-07-24 ENCOUNTER — Ambulatory Visit (INDEPENDENT_AMBULATORY_CARE_PROVIDER_SITE_OTHER): Payer: BC Managed Care – PPO | Admitting: Family Medicine

## 2019-07-24 ENCOUNTER — Emergency Department (HOSPITAL_COMMUNITY): Payer: BC Managed Care – PPO

## 2019-07-24 ENCOUNTER — Inpatient Hospital Stay (HOSPITAL_COMMUNITY)
Admission: EM | Admit: 2019-07-24 | Discharge: 2019-07-31 | DRG: 853 | Disposition: A | Discharge status: Rehabilitation Facility | Payer: BC Managed Care – PPO | Attending: Neurosurgery | Admitting: Neurosurgery

## 2019-07-24 ENCOUNTER — Other Ambulatory Visit: Payer: Self-pay

## 2019-07-24 ENCOUNTER — Encounter (HOSPITAL_COMMUNITY)
Admission: EM | Disposition: A | Discharge status: Rehabilitation Facility | Payer: Self-pay | Source: Home / Self Care | Attending: Neurosurgery

## 2019-07-24 ENCOUNTER — Encounter (HOSPITAL_COMMUNITY): Payer: Self-pay | Admitting: Emergency Medicine

## 2019-07-24 DIAGNOSIS — Z7952 Long term (current) use of systemic steroids: Secondary | ICD-10-CM | POA: Diagnosis not present

## 2019-07-24 DIAGNOSIS — R410 Disorientation, unspecified: Secondary | ICD-10-CM | POA: Diagnosis not present

## 2019-07-24 DIAGNOSIS — I081 Rheumatic disorders of both mitral and tricuspid valves: Secondary | ICD-10-CM | POA: Diagnosis present

## 2019-07-24 DIAGNOSIS — M21372 Foot drop, left foot: Secondary | ICD-10-CM | POA: Diagnosis not present

## 2019-07-24 DIAGNOSIS — R338 Other retention of urine: Secondary | ICD-10-CM | POA: Diagnosis not present

## 2019-07-24 DIAGNOSIS — A4101 Sepsis due to Methicillin susceptible Staphylococcus aureus: Principal | ICD-10-CM | POA: Diagnosis present

## 2019-07-24 DIAGNOSIS — A419 Sepsis, unspecified organism: Secondary | ICD-10-CM | POA: Diagnosis not present

## 2019-07-24 DIAGNOSIS — R0902 Hypoxemia: Secondary | ICD-10-CM | POA: Diagnosis not present

## 2019-07-24 DIAGNOSIS — G061 Intraspinal abscess and granuloma: Secondary | ICD-10-CM | POA: Diagnosis present

## 2019-07-24 DIAGNOSIS — Z419 Encounter for procedure for purposes other than remedying health state, unspecified: Secondary | ICD-10-CM

## 2019-07-24 DIAGNOSIS — L089 Local infection of the skin and subcutaneous tissue, unspecified: Secondary | ICD-10-CM | POA: Diagnosis present

## 2019-07-24 DIAGNOSIS — M48061 Spinal stenosis, lumbar region without neurogenic claudication: Secondary | ICD-10-CM | POA: Diagnosis present

## 2019-07-24 DIAGNOSIS — G062 Extradural and subdural abscess, unspecified: Secondary | ICD-10-CM

## 2019-07-24 DIAGNOSIS — I669 Occlusion and stenosis of unspecified cerebral artery: Secondary | ICD-10-CM | POA: Diagnosis present

## 2019-07-24 DIAGNOSIS — M4646 Discitis, unspecified, lumbar region: Secondary | ICD-10-CM | POA: Diagnosis present

## 2019-07-24 DIAGNOSIS — E669 Obesity, unspecified: Secondary | ICD-10-CM | POA: Diagnosis present

## 2019-07-24 DIAGNOSIS — M545 Low back pain, unspecified: Secondary | ICD-10-CM

## 2019-07-24 DIAGNOSIS — Z9889 Other specified postprocedural states: Secondary | ICD-10-CM

## 2019-07-24 DIAGNOSIS — Z91030 Bee allergy status: Secondary | ICD-10-CM | POA: Diagnosis not present

## 2019-07-24 DIAGNOSIS — G4700 Insomnia, unspecified: Secondary | ICD-10-CM | POA: Diagnosis not present

## 2019-07-24 DIAGNOSIS — R531 Weakness: Secondary | ICD-10-CM | POA: Diagnosis not present

## 2019-07-24 DIAGNOSIS — Z20822 Contact with and (suspected) exposure to covid-19: Secondary | ICD-10-CM | POA: Diagnosis present

## 2019-07-24 DIAGNOSIS — Z96643 Presence of artificial hip joint, bilateral: Secondary | ICD-10-CM | POA: Diagnosis present

## 2019-07-24 DIAGNOSIS — M62838 Other muscle spasm: Secondary | ICD-10-CM | POA: Diagnosis not present

## 2019-07-24 DIAGNOSIS — B9561 Methicillin susceptible Staphylococcus aureus infection as the cause of diseases classified elsewhere: Secondary | ICD-10-CM | POA: Diagnosis not present

## 2019-07-24 DIAGNOSIS — Z885 Allergy status to narcotic agent status: Secondary | ICD-10-CM | POA: Diagnosis not present

## 2019-07-24 DIAGNOSIS — R7881 Bacteremia: Secondary | ICD-10-CM | POA: Diagnosis not present

## 2019-07-24 DIAGNOSIS — Z888 Allergy status to other drugs, medicaments and biological substances status: Secondary | ICD-10-CM | POA: Diagnosis not present

## 2019-07-24 DIAGNOSIS — Z87891 Personal history of nicotine dependence: Secondary | ICD-10-CM

## 2019-07-24 DIAGNOSIS — F1010 Alcohol abuse, uncomplicated: Secondary | ICD-10-CM | POA: Diagnosis not present

## 2019-07-24 DIAGNOSIS — M4626 Osteomyelitis of vertebra, lumbar region: Secondary | ICD-10-CM | POA: Diagnosis not present

## 2019-07-24 DIAGNOSIS — F1020 Alcohol dependence, uncomplicated: Secondary | ICD-10-CM | POA: Diagnosis not present

## 2019-07-24 DIAGNOSIS — Z825 Family history of asthma and other chronic lower respiratory diseases: Secondary | ICD-10-CM | POA: Diagnosis not present

## 2019-07-24 DIAGNOSIS — M353 Polymyalgia rheumatica: Secondary | ICD-10-CM

## 2019-07-24 DIAGNOSIS — I639 Cerebral infarction, unspecified: Secondary | ICD-10-CM | POA: Diagnosis not present

## 2019-07-24 DIAGNOSIS — L89312 Pressure ulcer of right buttock, stage 2: Secondary | ICD-10-CM | POA: Diagnosis not present

## 2019-07-24 DIAGNOSIS — Z981 Arthrodesis status: Secondary | ICD-10-CM | POA: Diagnosis not present

## 2019-07-24 DIAGNOSIS — Z6831 Body mass index (BMI) 31.0-31.9, adult: Secondary | ICD-10-CM | POA: Diagnosis not present

## 2019-07-24 DIAGNOSIS — K6812 Psoas muscle abscess: Secondary | ICD-10-CM | POA: Diagnosis not present

## 2019-07-24 DIAGNOSIS — Z807 Family history of other malignant neoplasms of lymphoid, hematopoietic and related tissues: Secondary | ICD-10-CM | POA: Diagnosis not present

## 2019-07-24 DIAGNOSIS — M869 Osteomyelitis, unspecified: Secondary | ICD-10-CM

## 2019-07-24 DIAGNOSIS — D649 Anemia, unspecified: Secondary | ICD-10-CM | POA: Diagnosis not present

## 2019-07-24 DIAGNOSIS — E876 Hypokalemia: Secondary | ICD-10-CM | POA: Diagnosis not present

## 2019-07-24 DIAGNOSIS — E871 Hypo-osmolality and hyponatremia: Secondary | ICD-10-CM | POA: Diagnosis not present

## 2019-07-24 DIAGNOSIS — M549 Dorsalgia, unspecified: Secondary | ICD-10-CM | POA: Diagnosis present

## 2019-07-24 DIAGNOSIS — M5124 Other intervertebral disc displacement, thoracic region: Secondary | ICD-10-CM | POA: Diagnosis not present

## 2019-07-24 DIAGNOSIS — G834 Cauda equina syndrome: Secondary | ICD-10-CM | POA: Diagnosis present

## 2019-07-24 DIAGNOSIS — Z4789 Encounter for other orthopedic aftercare: Secondary | ICD-10-CM | POA: Diagnosis not present

## 2019-07-24 DIAGNOSIS — Z8249 Family history of ischemic heart disease and other diseases of the circulatory system: Secondary | ICD-10-CM | POA: Diagnosis not present

## 2019-07-24 HISTORY — PX: LUMBAR LAMINECTOMY/DECOMPRESSION MICRODISCECTOMY: SHX5026

## 2019-07-24 LAB — CBC WITH DIFFERENTIAL/PLATELET
Abs Immature Granulocytes: 0.2 10*3/uL — ABNORMAL HIGH (ref 0.00–0.07)
Basophils Absolute: 0.1 10*3/uL (ref 0.0–0.1)
Basophils Relative: 0 %
Eosinophils Absolute: 0.1 10*3/uL (ref 0.0–0.5)
Eosinophils Relative: 0 %
HCT: 40.4 % (ref 39.0–52.0)
Hemoglobin: 13.6 g/dL (ref 13.0–17.0)
Immature Granulocytes: 1 %
Lymphocytes Relative: 3 %
Lymphs Abs: 0.7 10*3/uL (ref 0.7–4.0)
MCH: 32.9 pg (ref 26.0–34.0)
MCHC: 33.7 g/dL (ref 30.0–36.0)
MCV: 97.8 fL (ref 80.0–100.0)
Monocytes Absolute: 1 10*3/uL (ref 0.1–1.0)
Monocytes Relative: 5 %
Neutro Abs: 17.9 10*3/uL — ABNORMAL HIGH (ref 1.7–7.7)
Neutrophils Relative %: 91 %
Platelets: 407 10*3/uL — ABNORMAL HIGH (ref 150–400)
RBC: 4.13 MIL/uL — ABNORMAL LOW (ref 4.22–5.81)
RDW: 13.3 % (ref 11.5–15.5)
WBC: 19.9 10*3/uL — ABNORMAL HIGH (ref 4.0–10.5)
nRBC: 0 % (ref 0.0–0.2)

## 2019-07-24 LAB — COMPREHENSIVE METABOLIC PANEL
ALT: 49 U/L — ABNORMAL HIGH (ref 0–44)
AST: 34 U/L (ref 15–41)
Albumin: 2.7 g/dL — ABNORMAL LOW (ref 3.5–5.0)
Alkaline Phosphatase: 122 U/L (ref 38–126)
Anion gap: 13 (ref 5–15)
BUN: 20 mg/dL (ref 6–20)
CO2: 25 mmol/L (ref 22–32)
Calcium: 9.1 mg/dL (ref 8.9–10.3)
Chloride: 90 mmol/L — ABNORMAL LOW (ref 98–111)
Creatinine, Ser: 0.75 mg/dL (ref 0.61–1.24)
GFR calc Af Amer: >60 mL/min (ref >60)
GFR calc non Af Amer: >60 mL/min (ref >60)
Glucose, Bld: 105 mg/dL — ABNORMAL HIGH (ref 70–99)
Potassium: 3.4 mmol/L — ABNORMAL LOW (ref 3.5–5.1)
Sodium: 128 mmol/L — ABNORMAL LOW (ref 135–145)
Total Bilirubin: 1.2 mg/dL (ref 0.3–1.2)
Total Protein: 8.6 g/dL — ABNORMAL HIGH (ref 6.5–8.1)

## 2019-07-24 LAB — URINALYSIS, ROUTINE W REFLEX MICROSCOPIC
Bilirubin Urine: NEGATIVE
Glucose, UA: NEGATIVE mg/dL
Ketones, ur: NEGATIVE mg/dL
Nitrite: POSITIVE — AB
Protein, ur: NEGATIVE mg/dL
Specific Gravity, Urine: 1.017 (ref 1.005–1.030)
pH: 6 (ref 5.0–8.0)

## 2019-07-24 LAB — RAPID URINE DRUG SCREEN, HOSP PERFORMED
Amphetamines: NOT DETECTED
Barbiturates: NOT DETECTED
Benzodiazepines: NOT DETECTED
Cocaine: NOT DETECTED
Opiates: NOT DETECTED
Tetrahydrocannabinol: NOT DETECTED

## 2019-07-24 LAB — HIV ANTIBODY (ROUTINE TESTING W REFLEX): HIV Screen 4th Generation wRfx: NONREACTIVE

## 2019-07-24 LAB — RESPIRATORY PANEL BY RT PCR (FLU A&B, COVID)
Influenza A by PCR: NEGATIVE
Influenza B by PCR: NEGATIVE
SARS Coronavirus 2 by RT PCR: NEGATIVE

## 2019-07-24 LAB — LACTIC ACID, PLASMA
Lactic Acid, Venous: 1.8 mmol/L (ref 0.5–1.9)
Lactic Acid, Venous: 1 mmol/L (ref 0.5–1.9)

## 2019-07-24 LAB — APTT: aPTT: 26 seconds (ref 24–36)

## 2019-07-24 LAB — ETHANOL: Alcohol, Ethyl (B): <10 mg/dL (ref <10)

## 2019-07-24 LAB — PROTIME-INR
INR: 1.5 — ABNORMAL HIGH (ref 0.8–1.2)
Prothrombin Time: 17.6 seconds — ABNORMAL HIGH (ref 11.4–15.2)

## 2019-07-24 LAB — SEDIMENTATION RATE: Sed Rate: 114 mm/hr — ABNORMAL HIGH (ref 0–16)

## 2019-07-24 IMAGING — MR MR LUMBAR SPINE WO/W CM
5 of 16 series · 14 of 48 positions shown · IV contrast (gadavist)
Comparison: Prior radiograph from [DATE].

CLINICAL DATA: Initial evaluation for acute low back pain with left
leg pain.

EXAM:
MRI THORACIC AND LUMBAR SPINE WITHOUT AND WITH CONTRAST
TECHNIQUE: Multiplanar and multiecho pulse sequences of the thoracic and lumbar
spine were obtained without and with intravenous contrast.
CONTRAST:  10mL GADAVIST GADOBUTROL 1 MMOL/ML IV SOLN

[Series 5: T1 · sagittal · 3.0mm · 0.68mm/px · 1 of 16 slices shown]
[im 1/16]
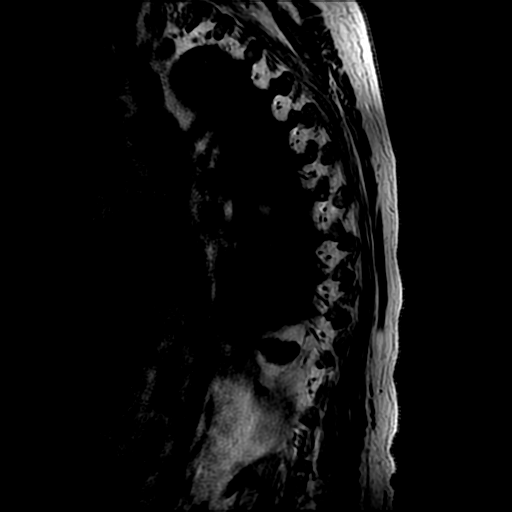

[Series 7: T2 · sagittal · 3.0mm · 0.68mm/px · 2 of 16 slices shown (1 of 4)]
[im 1/16]
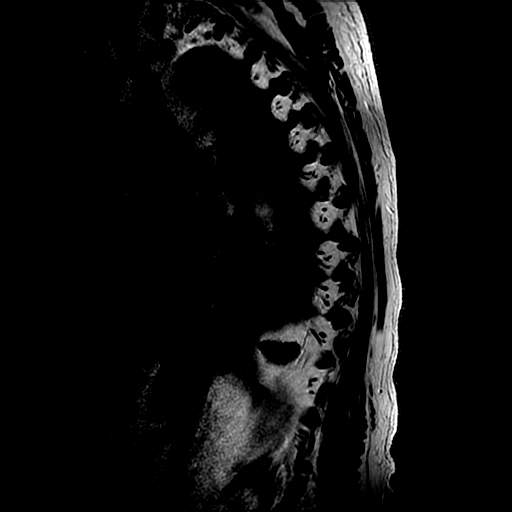
[im 16/16]
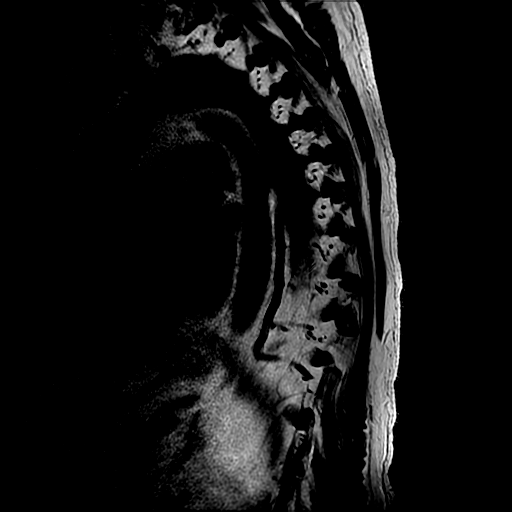

[Series 8: T2 · axial · 4.0mm · 0.39mm/px · z∈[-240,-16]mm · 5 of 45 slices shown (2 of 4)]
[im 1/45]
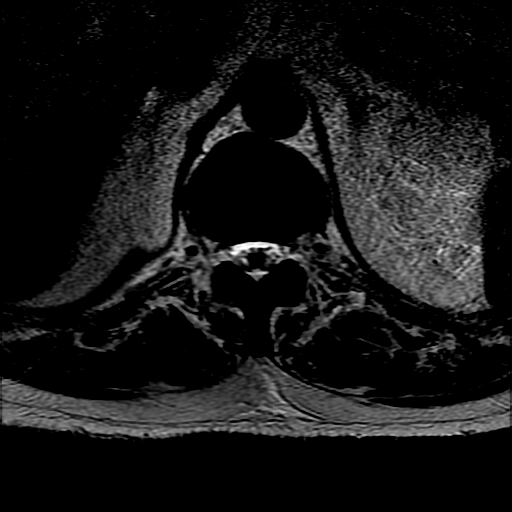
[im 12/45]
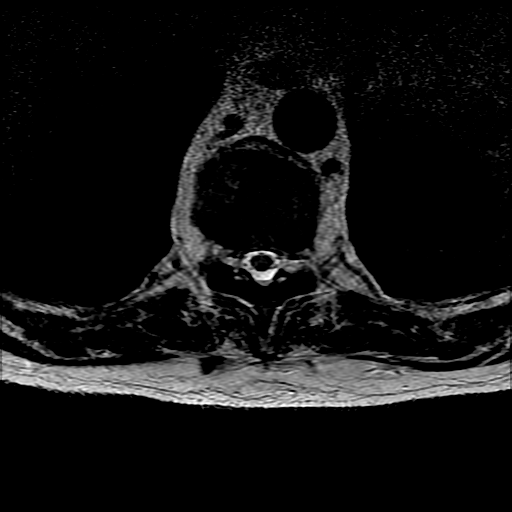
[im 23/45]
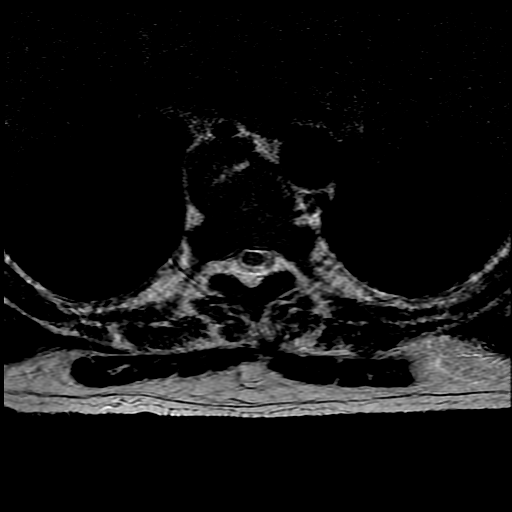
[im 34/45]
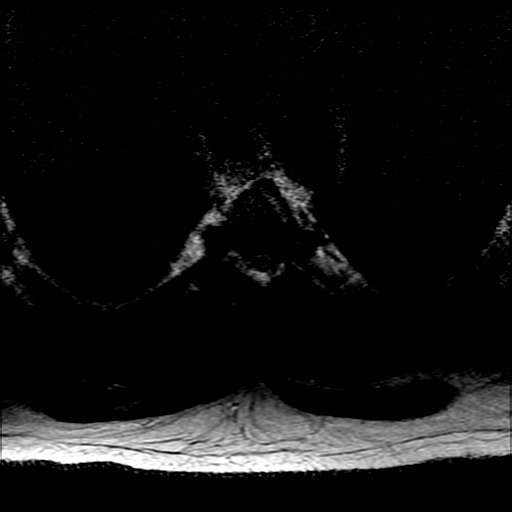
[im 45/45]
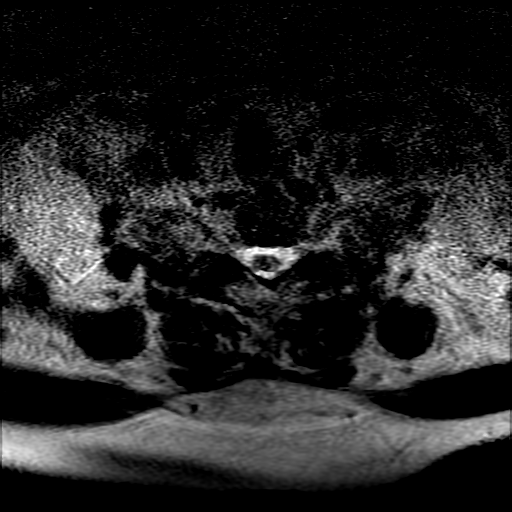

[Series 14: T2 · sagittal · 4.0mm · 0.51mm/px · 2 of 15 slices shown (3 of 4)]
[im 1/15]
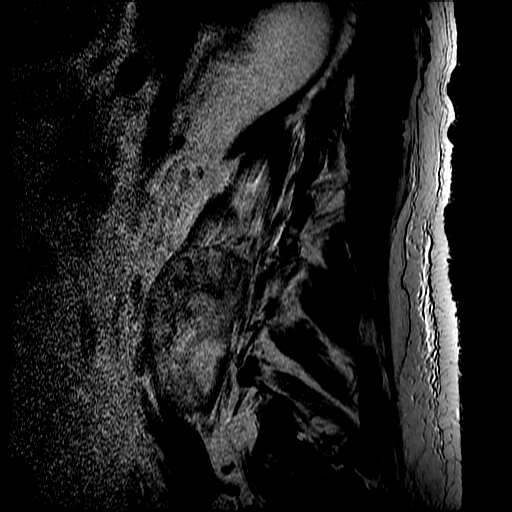
[im 15/15]
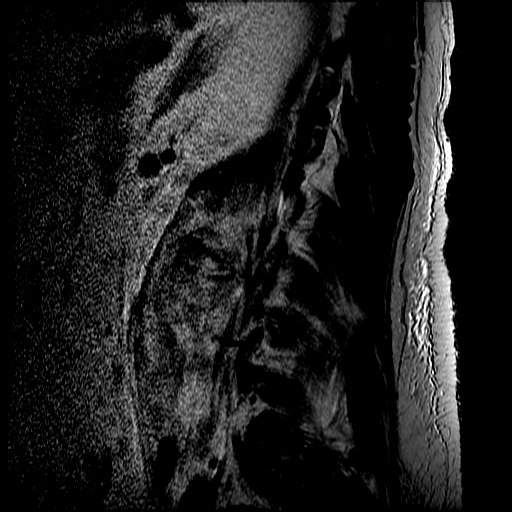

[Series 16: T2 · axial · 4.0mm · 0.39mm/px · z∈[-483,-261]mm · 4 of 41 slices shown (4 of 4)]
[im 1/41]
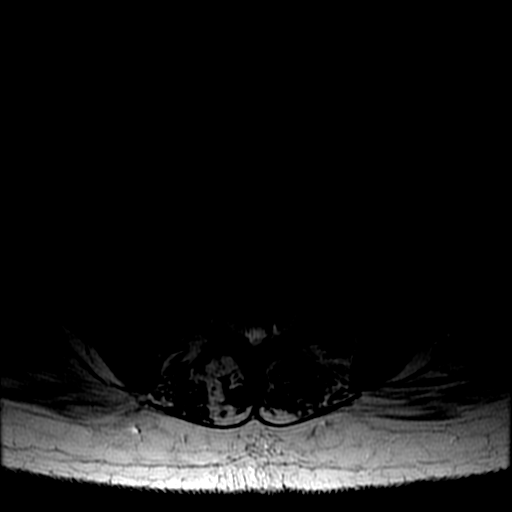
[im 14/41]
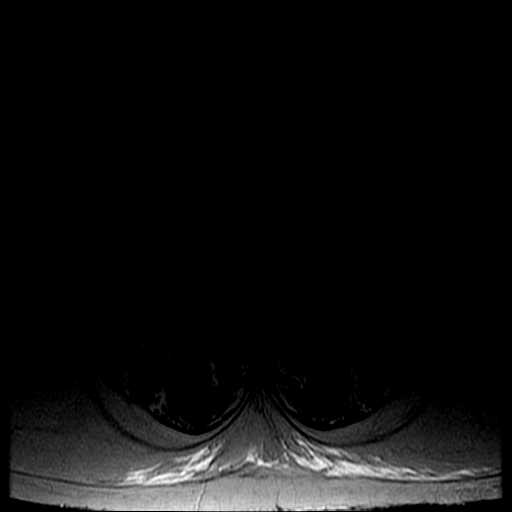
[im 27/41]
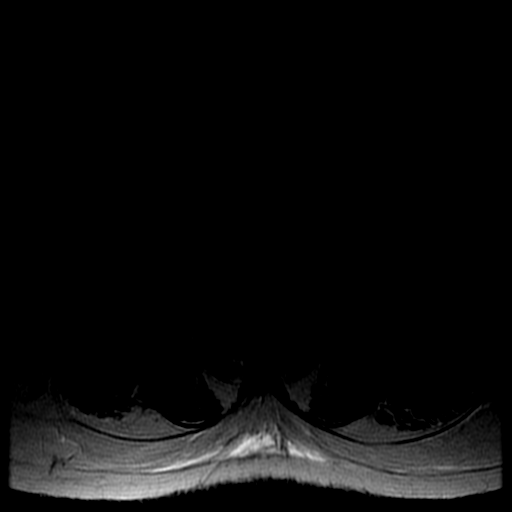
[im 41/41]
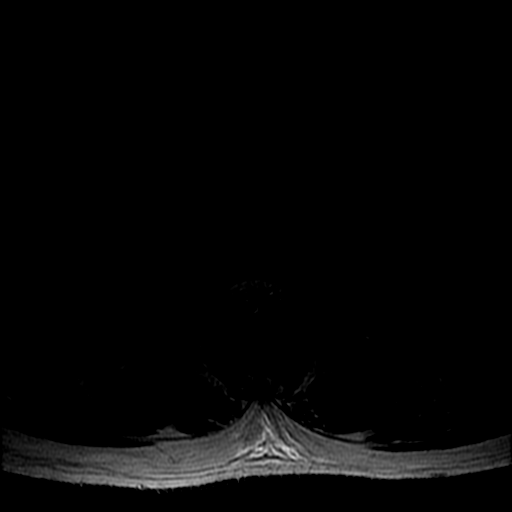

[14 of 48 positions shown; findings below may reference images not displayed]

FINDINGS: MRI THORACIC SPINE FINDINGS

Alignment: Vertebral bodies normally aligned with preservation of
the normal thoracic kyphosis. No listhesis.

Vertebrae: Vertebral body height maintained without evidence for
acute or chronic fracture. Multiple scattered chronic endplate
Schmorl's nodes noted throughout the mid and lower thoracic spine.
Underlying bone marrow signal intensity diffusely decreased on T1
weighted imaging, nonspecific, but most commonly related to anemia,
smoking, or obesity. Few scattered benign hemangiomata noted within
the T1, T6, and T12 vertebral bodies. No other discrete or worrisome
osseous lesions. No other abnormal marrow edema or enhancement. No
evidence for osteomyelitis discitis or septic arthritis.

Cord: Signal intensity within the thoracic spinal cord is grossly
within normal limits on this motion degraded exam. Normal cord
caliber morphology. No abnormal enhancement. No appreciable epidural
collection.

Paraspinal and other soft tissues: Paraspinous soft tissues within
normal limits. Minimal atelectatic changes noted within the
posterior left lung.

Disc levels:

T1-2:  Unremarkable.

T2-3: Mild right eccentric disc bulge. No canal or foraminal
stenosis.

T3-4: Negative interspace. Right greater than left facet
hypertrophy. No stenosis.

T4-5:  Negative interspace.  Mild facet hypertrophy.  No stenosis.

T5-6: Diffuse disc bulge with intervertebral disc space narrowing.
Mild facet hypertrophy. No significant spinal stenosis. Foramina
remain patent.

T6-7:  Unremarkable.

T7-8: Shallow right paracentral disc protrusion indents the right
ventral thecal sac. Minimal flattening of the ventral right cord
without cord signal changes. No significant canal or foraminal
stenosis.

T8-9: Shallow central disc protrusion indents the ventral thecal
sac, slightly eccentric to the right. Minimal flattening of the
ventral cord without cord signal changes. No significant spinal
stenosis. Foramina remain patent.

T9-10: Mild disc bulge.  No stenosis.

T10-11: Mild disc bulge. Prominent right-sided endplate osteophytic
spurring. Right greater than left facet hypertrophy. No stenosis.

T11-12: Mild disc bulge. Moderate bilateral facet hypertrophy. No
stenosis.

T12-L1:  Unremarkable.

MRI LUMBAR SPINE FINDINGS

Segmentation: Standard. Lowest well-formed disc space labeled the
L5-S1 level.

Alignment: Trace retrolisthesis of L3 on L4 and L4 on L5. Alignment
otherwise normal preservation of the normal lumbar lordosis.

Vertebrae: Vertebral body height maintained without evidence for
acute or chronic fracture. Bone marrow signal intensity diffusely
decreased on T1 weighted imaging, nonspecific, but most commonly
related to anemia, smoking, or obesity. Few scattered benign
hemangiomata noted, most prominent of which position within the L2
and L3 vertebral bodies. No other worrisome osseous lesions.

There is abnormal marrow edema and enhancement involving the right
greater than left aspect of the L4 vertebral body, concerning for
acute osteomyelitis. Extension into the right L4 pedicle with
possible involvement of the right L3-4 facet (series 18, image 5).
Mild edema and enhancement also noted at the right aspect of the L3
inferior endplate, which could also be involved. There is preserved
disc height at the intervening L3-4 interspace without overt
evidence for acute discitis. Associated paraspinous edema and
enhancement seen involving the adjacent psoas musculature
bilaterally, extending from L2-3 inferiorly, worse on the right.
Superimposed paraspinous abscesses are seen bilaterally. Collection
on the right measures 3.9 x 3.2 cm (series 19, image 26). Few
scattered heterogeneous but smaller collection seen involving the
left psoas, largest of which seen at the level of L4-5 and measures
2.1 cm (series 19, image 25).

Evidence for epidural extension of infection with epidural abscess
seen extending from approximately L3-4 through L4-5. Abscess
primarily involves the dorsal epidural space at the level of L3-4
(series 19, image 18). Overall, collection measures approximately
1.0 x 0.7 x 6.3 cm in greatest dimensions (AP by transverse by
craniocaudad). Secondary mass effect on the thecal sac which is
compressed anteriorly at the level of L3-4 (series 19, image 18),
into the left at the level of L4-5 (series 19, image 27). There is
fairly diffuse severe spinal stenosis extending from L3 through
L4-5.

Conus medullaris: Extends to the L1 level and appears normal.

Paraspinal and other soft tissues: Paraspinal infection with
associated bilateral psoas muscle abscesses as above. Additional
mild soft tissue edema adjacent to the right L3-4 facet, likely
infected.

Disc levels:

L1-2: Mild circumferential disc bulge. Mild bilateral facet
hypertrophy. No significant stenosis.

L2-3: Minimal annular disc bulge. Mild to moderate bilateral facet
hypertrophy. No significant canal or foraminal stenosis.

L3-4: Findings concerning for osteomyelitis involving the L4 and to
a lesser extent L3 vertebral bodies. Suspected discitis involving
the L3-4 interspace, although no overt changes evident by MRI as of
yet. Circumferential disc bulge. Moderate bilateral facet
hypertrophy with small bilateral joint effusions. Edema enhancement
about the right L3-4 facet, suspected to be infected. Dorsal
epidural abscess with compression of the thecal sac anteriorly.
Resultant severe spinal stenosis. Moderate right L3 foraminal
narrowing.

L4-5: Diffuse disc bulge with disc desiccation. Changes related
osteomyelitis involving the L4 vertebral body. Moderate bilateral
facet hypertrophy. Epidural abscess involving the dorsal and right
aspect of the spinal canal with compression of the thecal sac to the
left. Resultant severe spinal stenosis. Moderate right with mild
left L4 foraminal narrowing.

L5-S1: Negative interspace. Moderate to severe right worse than left
facet hypertrophy. No stenosis.
IMPRESSION: 1. Findings consistent with acute osteomyelitis centered about the
L3-4 interspace as above. Associated epidural abscess extending from
L3-4 through L4-5 with resultant severe diffuse spinal stenosis.
2. Associated paraspinous soft tissue infection with bilateral psoas
muscle abscesses, right greater than left.
3. No other evidence for acute or distant infection elsewhere within
the thoracic or lumbar spine.
4. Underlying mild to moderate multilevel degenerative spondylosis
as above. No other high-grade stenosis or impingement.

Critical Value/emergent results were called by telephone at the time
of interpretation on [DATE] at [DATE] to TIGER ,
who verbally acknowledged these results.

## 2019-07-24 IMAGING — DX DG CHEST 1V PORT
1 series · 2 of 2 positions shown · non-contrast
Comparison: Chest x-ray dated [DATE].

CLINICAL DATA: Sepsis.

EXAM:
PORTABLE CHEST 1 VIEW

[Series 1: chest ap · 0.14mm/px · 2 of 2 slices shown]
[im 1/2]
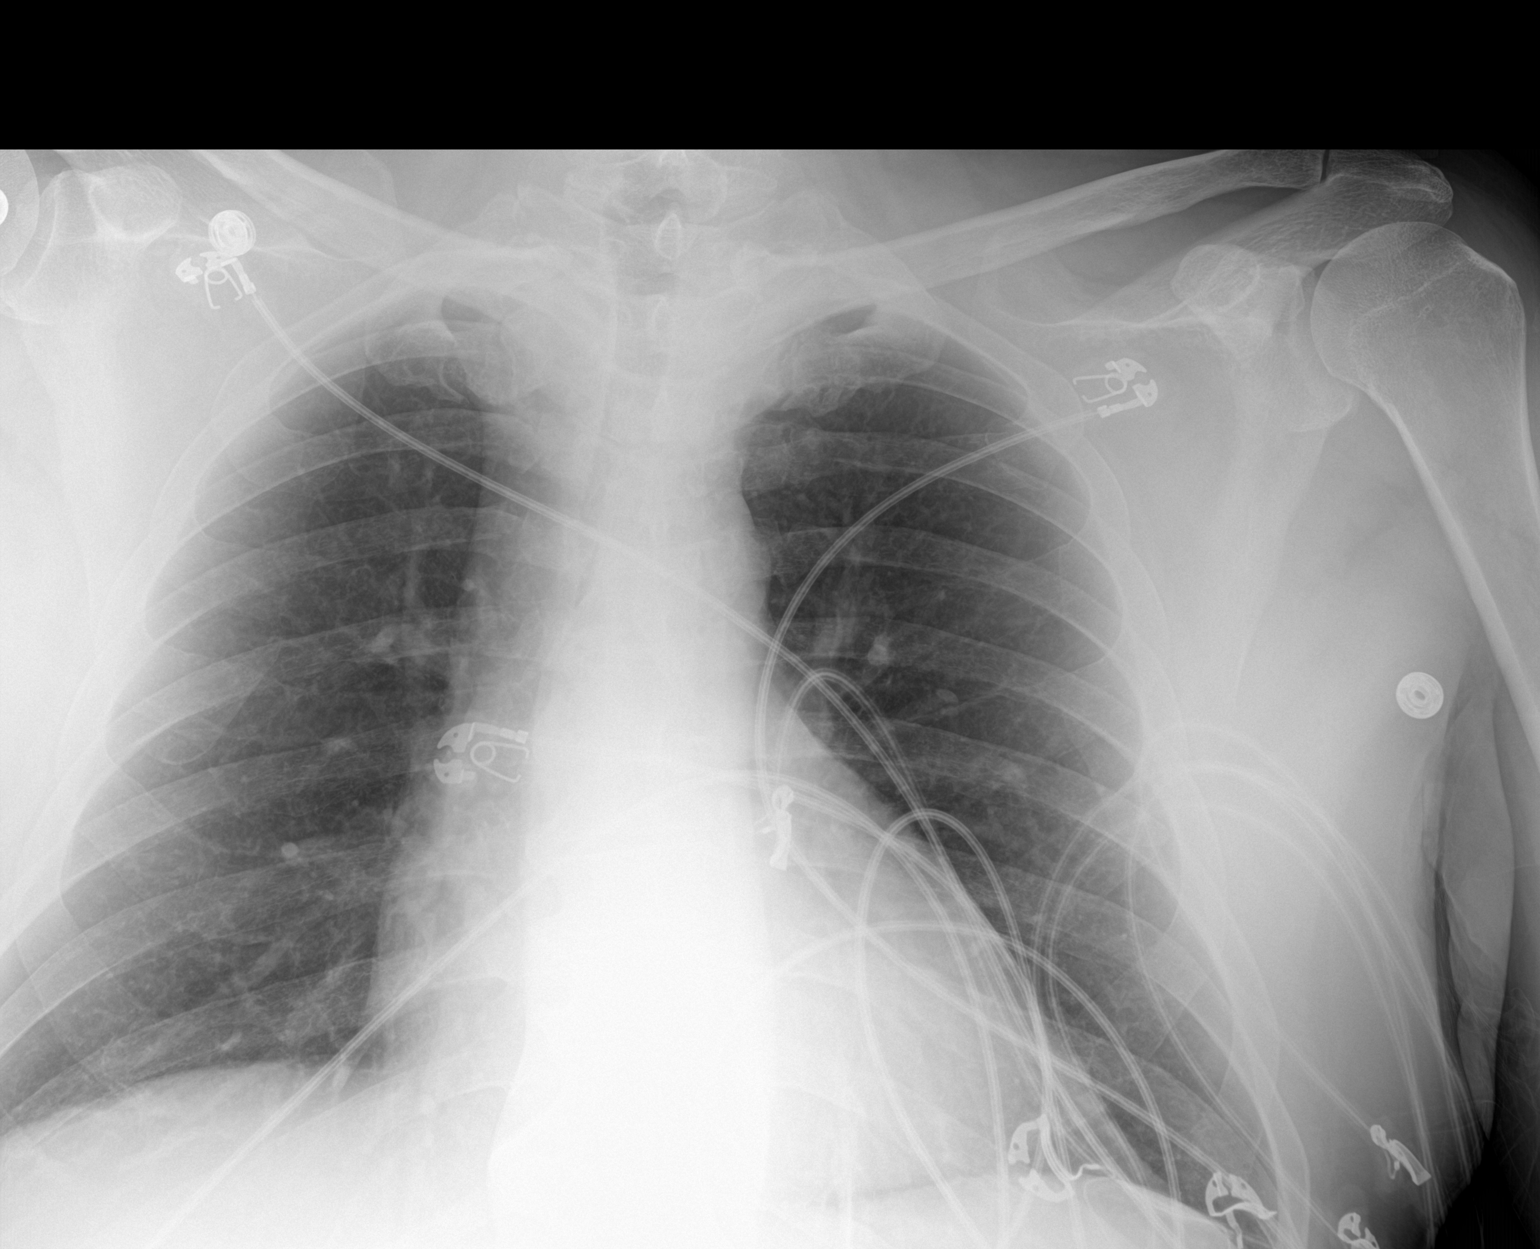
[im 2/2]
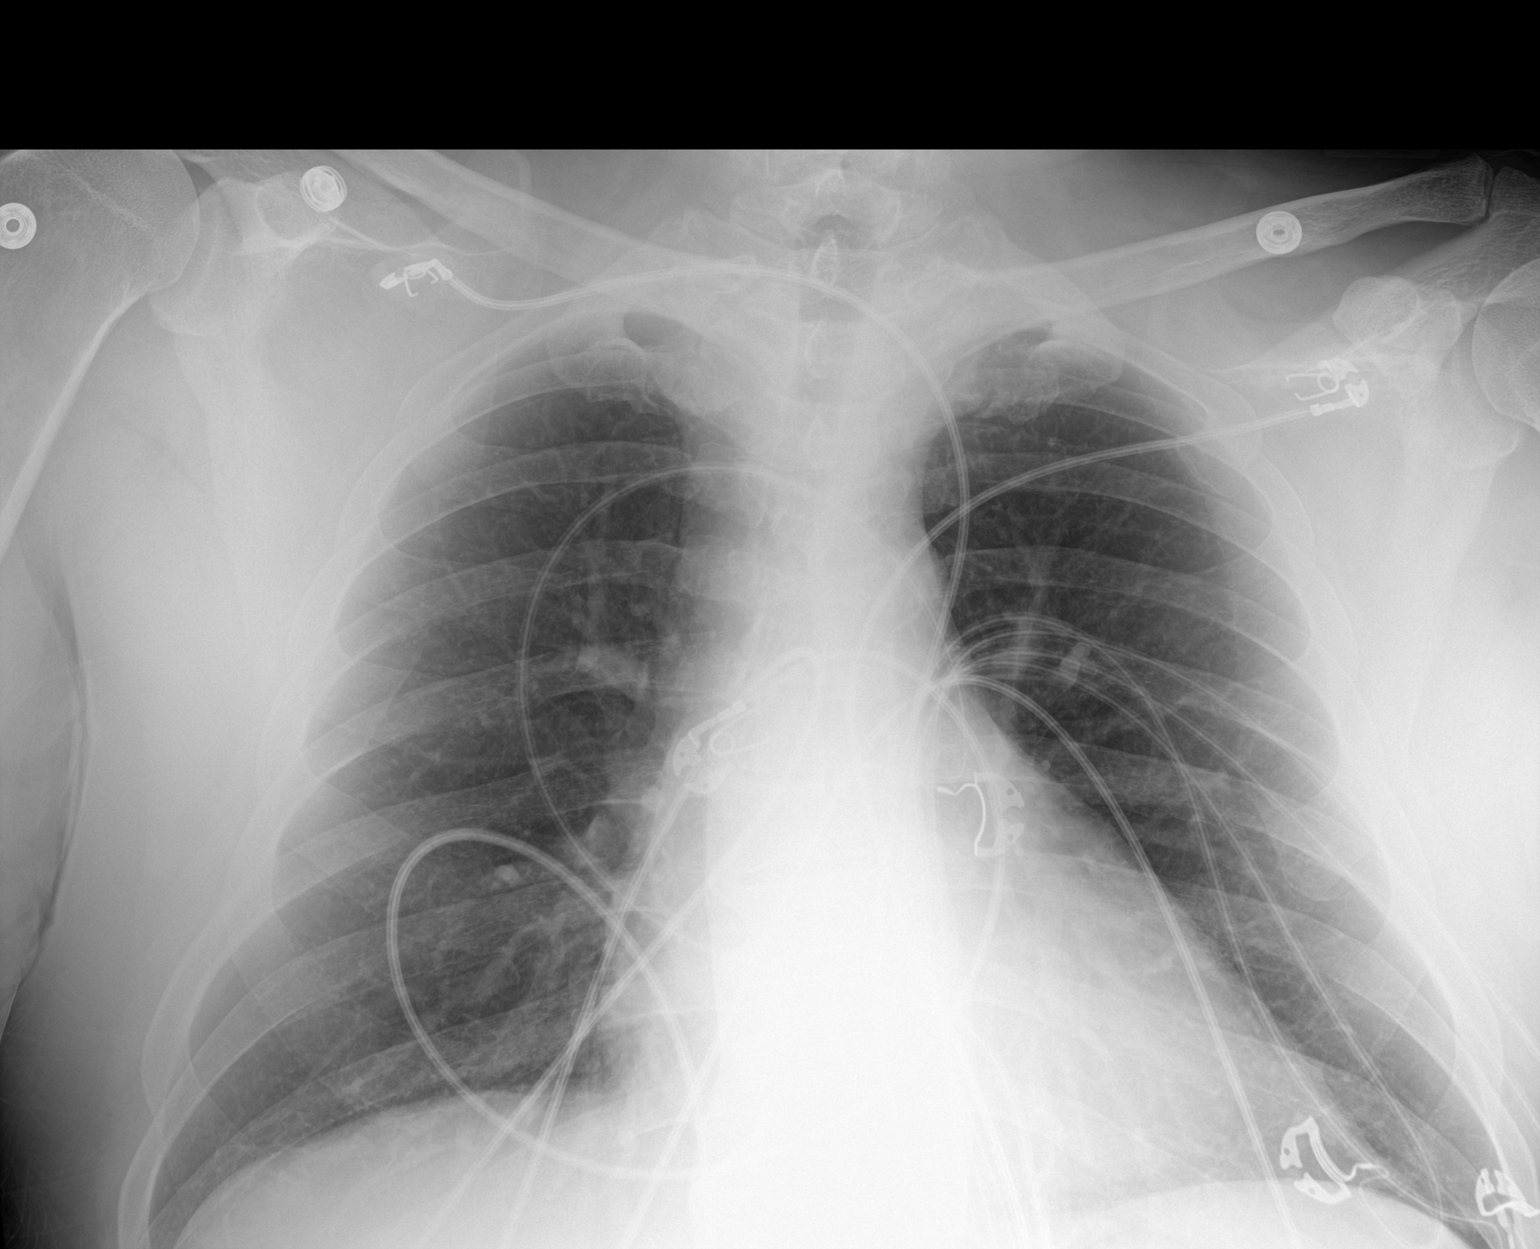

[2 of 2 positions shown; findings below may reference images not displayed]

FINDINGS: The heart size and mediastinal contours are within normal limits.
Both lungs are clear. The visualized skeletal structures are
unremarkable.
IMPRESSION: No active disease.

## 2019-07-24 IMAGING — MR MR THORACIC SPINE WO/W CM
4 of 13 series · 14 of 48 positions shown · IV contrast (yes)
Comparison: Prior radiograph from [DATE].

CLINICAL DATA: Initial evaluation for acute low back pain with left
leg pain.

EXAM:
MRI THORACIC AND LUMBAR SPINE WITHOUT AND WITH CONTRAST
TECHNIQUE: Multiplanar and multiecho pulse sequences of the thoracic and lumbar
spine were obtained without and with intravenous contrast.
CONTRAST:  10mL GADAVIST GADOBUTROL 1 MMOL/ML IV SOLN

[Series 7: T2 · sagittal · 3.0mm · 0.68mm/px · 2 of 16 slices shown (1 of 4)]
[im 1/16]
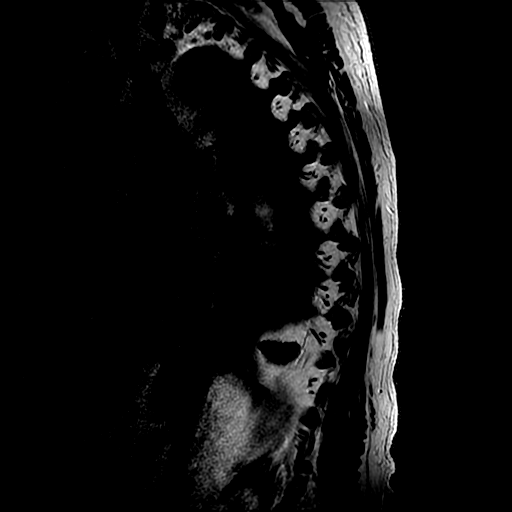
[im 16/16]
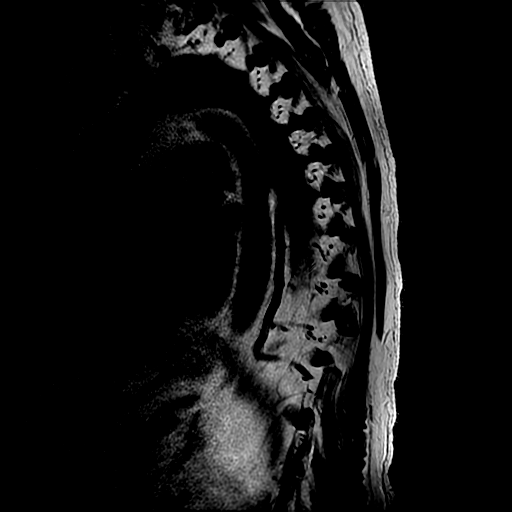

[Series 8: T2 · axial · 4.0mm · 0.39mm/px · z∈[-240,-16]mm · 5 of 45 slices shown (2 of 4)]
[im 1/45]
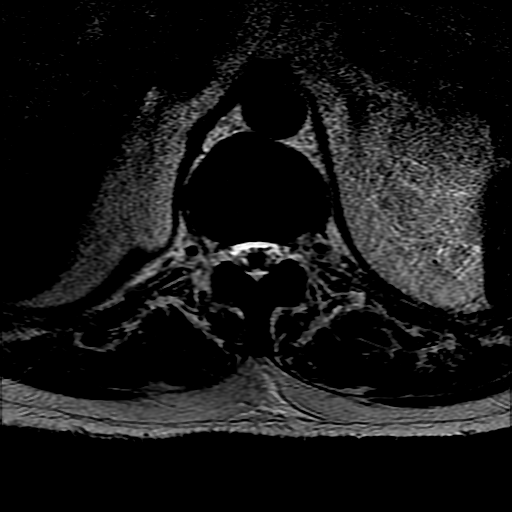
[im 12/45]
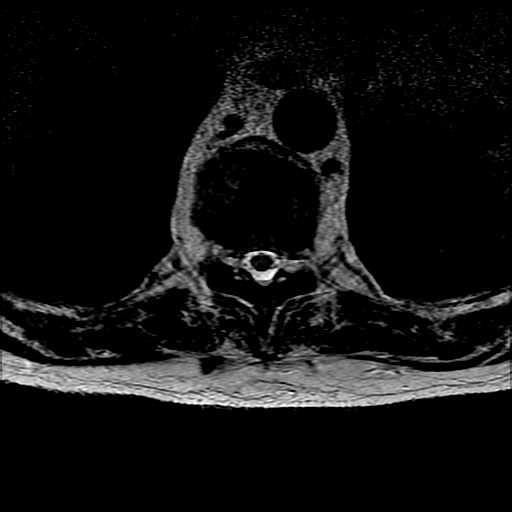
[im 23/45]
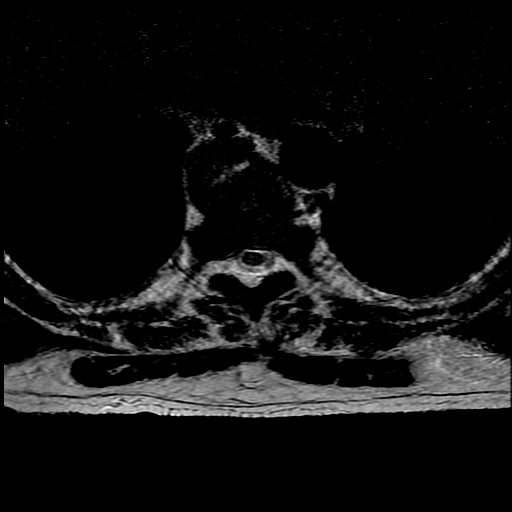
[im 34/45]
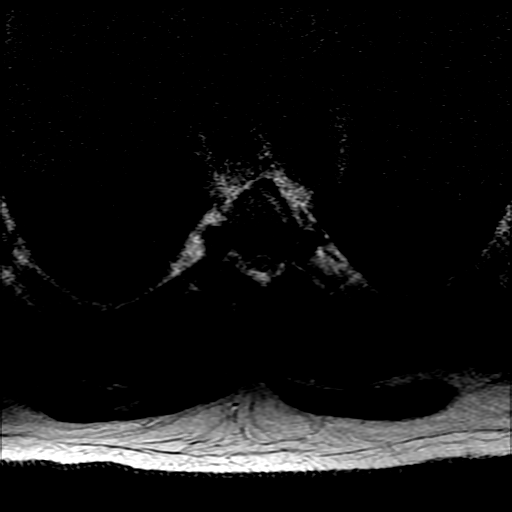
[im 45/45]
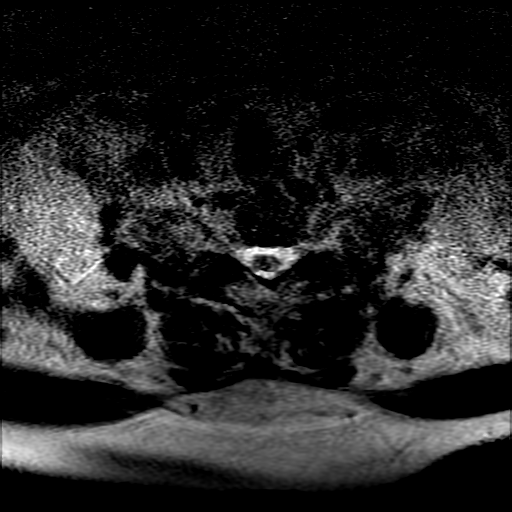

[Series 14: T2 · sagittal · 4.0mm · 0.51mm/px · 2 of 15 slices shown (3 of 4)]
[im 1/15]
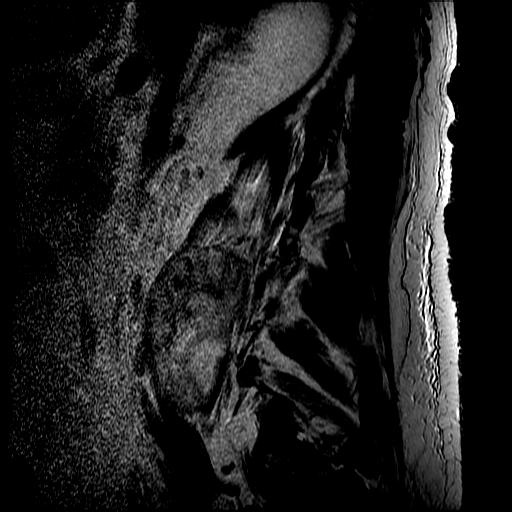
[im 15/15]
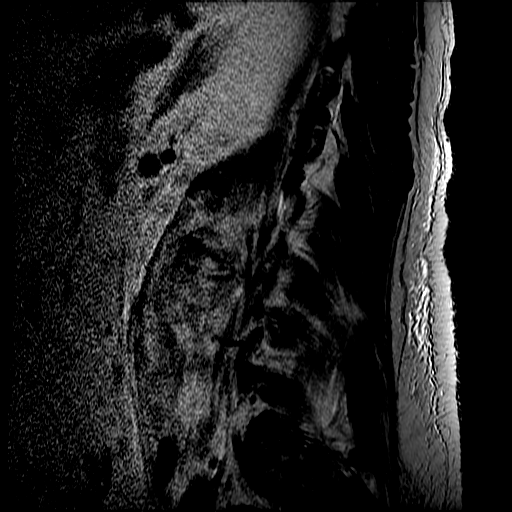

[Series 16: T2 · axial · 4.0mm · 0.39mm/px · z∈[-483,-261]mm · 5 of 41 slices shown (4 of 4)]
[im 1/41]
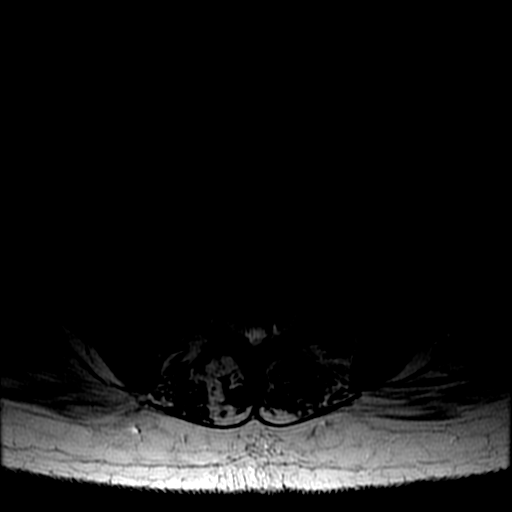
[im 11/41]
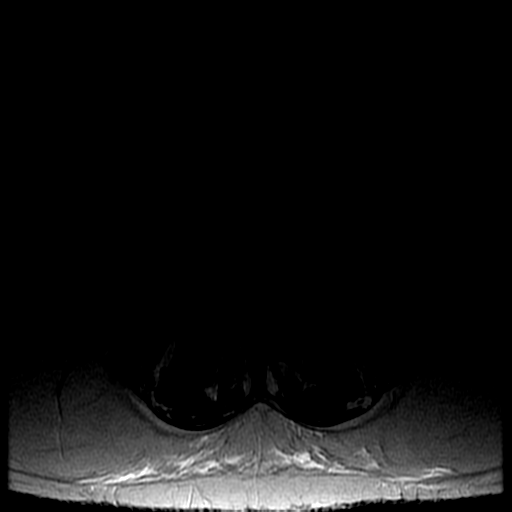
[im 21/41]
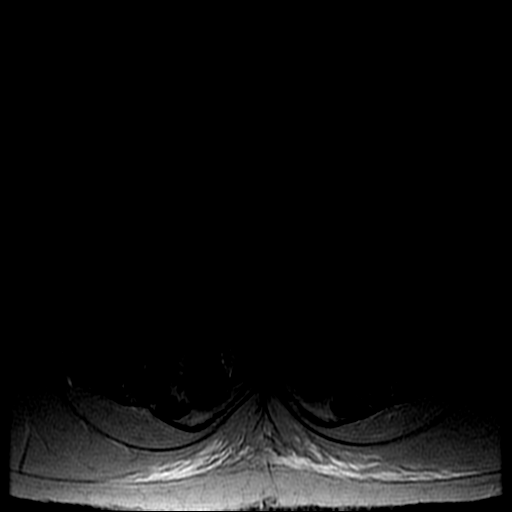
[im 31/41]
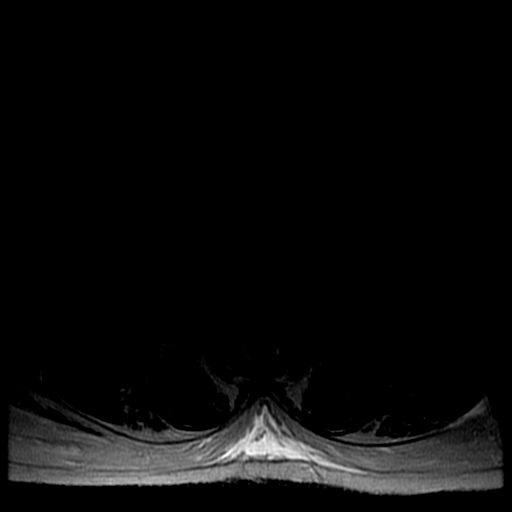
[im 41/41]
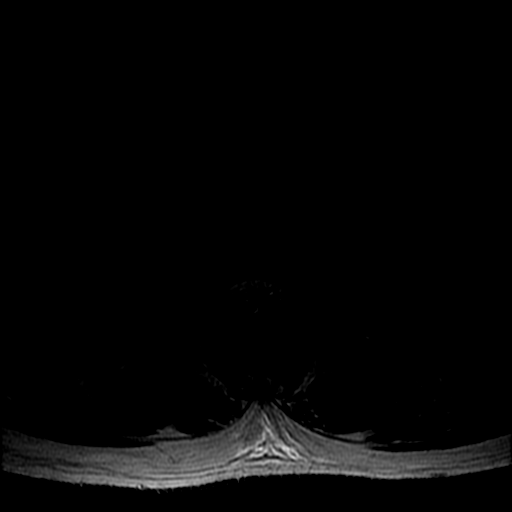

[14 of 48 positions shown; findings below may reference images not displayed]

FINDINGS: MRI THORACIC SPINE FINDINGS

Alignment: Vertebral bodies normally aligned with preservation of
the normal thoracic kyphosis. No listhesis.

Vertebrae: Vertebral body height maintained without evidence for
acute or chronic fracture. Multiple scattered chronic endplate
Schmorl's nodes noted throughout the mid and lower thoracic spine.
Underlying bone marrow signal intensity diffusely decreased on T1
weighted imaging, nonspecific, but most commonly related to anemia,
smoking, or obesity. Few scattered benign hemangiomata noted within
the T1, T6, and T12 vertebral bodies. No other discrete or worrisome
osseous lesions. No other abnormal marrow edema or enhancement. No
evidence for osteomyelitis discitis or septic arthritis.

Cord: Signal intensity within the thoracic spinal cord is grossly
within normal limits on this motion degraded exam. Normal cord
caliber morphology. No abnormal enhancement. No appreciable epidural
collection.

Paraspinal and other soft tissues: Paraspinous soft tissues within
normal limits. Minimal atelectatic changes noted within the
posterior left lung.

Disc levels:

T1-2:  Unremarkable.

T2-3: Mild right eccentric disc bulge. No canal or foraminal
stenosis.

T3-4: Negative interspace. Right greater than left facet
hypertrophy. No stenosis.

T4-5:  Negative interspace.  Mild facet hypertrophy.  No stenosis.

T5-6: Diffuse disc bulge with intervertebral disc space narrowing.
Mild facet hypertrophy. No significant spinal stenosis. Foramina
remain patent.

T6-7:  Unremarkable.

T7-8: Shallow right paracentral disc protrusion indents the right
ventral thecal sac. Minimal flattening of the ventral right cord
without cord signal changes. No significant canal or foraminal
stenosis.

T8-9: Shallow central disc protrusion indents the ventral thecal
sac, slightly eccentric to the right. Minimal flattening of the
ventral cord without cord signal changes. No significant spinal
stenosis. Foramina remain patent.

T9-10: Mild disc bulge.  No stenosis.

T10-11: Mild disc bulge. Prominent right-sided endplate osteophytic
spurring. Right greater than left facet hypertrophy. No stenosis.

T11-12: Mild disc bulge. Moderate bilateral facet hypertrophy. No
stenosis.

T12-L1:  Unremarkable.

MRI LUMBAR SPINE FINDINGS

Segmentation: Standard. Lowest well-formed disc space labeled the
L5-S1 level.

Alignment: Trace retrolisthesis of L3 on L4 and L4 on L5. Alignment
otherwise normal preservation of the normal lumbar lordosis.

Vertebrae: Vertebral body height maintained without evidence for
acute or chronic fracture. Bone marrow signal intensity diffusely
decreased on T1 weighted imaging, nonspecific, but most commonly
related to anemia, smoking, or obesity. Few scattered benign
hemangiomata noted, most prominent of which position within the L2
and L3 vertebral bodies. No other worrisome osseous lesions.

There is abnormal marrow edema and enhancement involving the right
greater than left aspect of the L4 vertebral body, concerning for
acute osteomyelitis. Extension into the right L4 pedicle with
possible involvement of the right L3-4 facet (series 18, image 5).
Mild edema and enhancement also noted at the right aspect of the L3
inferior endplate, which could also be involved. There is preserved
disc height at the intervening L3-4 interspace without overt
evidence for acute discitis. Associated paraspinous edema and
enhancement seen involving the adjacent psoas musculature
bilaterally, extending from L2-3 inferiorly, worse on the right.
Superimposed paraspinous abscesses are seen bilaterally. Collection
on the right measures 3.9 x 3.2 cm (series 19, image 26). Few
scattered heterogeneous but smaller collection seen involving the
left psoas, largest of which seen at the level of L4-5 and measures
2.1 cm (series 19, image 25).

Evidence for epidural extension of infection with epidural abscess
seen extending from approximately L3-4 through L4-5. Abscess
primarily involves the dorsal epidural space at the level of L3-4
(series 19, image 18). Overall, collection measures approximately
1.0 x 0.7 x 6.3 cm in greatest dimensions (AP by transverse by
craniocaudad). Secondary mass effect on the thecal sac which is
compressed anteriorly at the level of L3-4 (series 19, image 18),
into the left at the level of L4-5 (series 19, image 27). There is
fairly diffuse severe spinal stenosis extending from L3 through
L4-5.

Conus medullaris: Extends to the L1 level and appears normal.

Paraspinal and other soft tissues: Paraspinal infection with
associated bilateral psoas muscle abscesses as above. Additional
mild soft tissue edema adjacent to the right L3-4 facet, likely
infected.

Disc levels:

L1-2: Mild circumferential disc bulge. Mild bilateral facet
hypertrophy. No significant stenosis.

L2-3: Minimal annular disc bulge. Mild to moderate bilateral facet
hypertrophy. No significant canal or foraminal stenosis.

L3-4: Findings concerning for osteomyelitis involving the L4 and to
a lesser extent L3 vertebral bodies. Suspected discitis involving
the L3-4 interspace, although no overt changes evident by MRI as of
yet. Circumferential disc bulge. Moderate bilateral facet
hypertrophy with small bilateral joint effusions. Edema enhancement
about the right L3-4 facet, suspected to be infected. Dorsal
epidural abscess with compression of the thecal sac anteriorly.
Resultant severe spinal stenosis. Moderate right L3 foraminal
narrowing.

L4-5: Diffuse disc bulge with disc desiccation. Changes related
osteomyelitis involving the L4 vertebral body. Moderate bilateral
facet hypertrophy. Epidural abscess involving the dorsal and right
aspect of the spinal canal with compression of the thecal sac to the
left. Resultant severe spinal stenosis. Moderate right with mild
left L4 foraminal narrowing.

L5-S1: Negative interspace. Moderate to severe right worse than left
facet hypertrophy. No stenosis.
IMPRESSION: 1. Findings consistent with acute osteomyelitis centered about the
L3-4 interspace as above. Associated epidural abscess extending from
L3-4 through L4-5 with resultant severe diffuse spinal stenosis.
2. Associated paraspinous soft tissue infection with bilateral psoas
muscle abscesses, right greater than left.
3. No other evidence for acute or distant infection elsewhere within
the thoracic or lumbar spine.
4. Underlying mild to moderate multilevel degenerative spondylosis
as above. No other high-grade stenosis or impingement.

Critical Value/emergent results were called by telephone at the time
of interpretation on [DATE] at [DATE] to TIGER ,
who verbally acknowledged these results.

## 2019-07-24 SURGERY — LUMBAR LAMINECTOMY/DECOMPRESSION MICRODISCECTOMY 1 LEVEL
Anesthesia: General | Site: Back

## 2019-07-24 MED ORDER — LORAZEPAM 1 MG PO TABS: 0.0000 mg | ORAL_TABLET | Freq: Two times a day (BID) | ORAL | Status: AC

## 2019-07-24 MED ORDER — PREDNISONE 5 MG PO TABS: 15.0000 mg | ORAL_TABLET | Freq: Every day | ORAL | 0 refills | Status: DC

## 2019-07-24 MED ORDER — SODIUM CHLORIDE 0.9 % IV SOLN
2.0000 g | Freq: Once | INTRAVENOUS | Status: AC
  Administered 2019-07-24: 2 g via INTRAVENOUS
  Filled 2019-07-24: qty 2

## 2019-07-24 MED ORDER — LORAZEPAM 2 MG/ML IJ SOLN: 0.0000 mg | Freq: Two times a day (BID) | INTRAMUSCULAR | Status: AC

## 2019-07-24 MED ORDER — THIAMINE HCL 100 MG/ML IJ SOLN
100.0000 mg | Freq: Every day | INTRAMUSCULAR | Status: DC
  Administered 2019-07-24 – 2019-07-25 (×2): 100 mg via INTRAVENOUS
  Filled 2019-07-24 (×2): qty 2

## 2019-07-24 MED ORDER — LORAZEPAM 2 MG/ML IJ SOLN
0.0000 mg | Freq: Four times a day (QID) | INTRAMUSCULAR | Status: AC
  Administered 2019-07-24: 1 mg via INTRAVENOUS
  Administered 2019-07-25: 4 mg via INTRAVENOUS
  Administered 2019-07-26: 1 mg via INTRAVENOUS
  Administered 2019-07-26 (×2): 2 mg via INTRAVENOUS
  Filled 2019-07-24 (×2): qty 2
  Filled 2019-07-24 (×3): qty 1

## 2019-07-24 MED ORDER — ACETAMINOPHEN 500 MG PO TABS
1000.0000 mg | ORAL_TABLET | Freq: Once | ORAL | Status: AC
  Administered 2019-07-24: 1000 mg via ORAL
  Filled 2019-07-24: qty 2

## 2019-07-24 MED ORDER — SODIUM CHLORIDE 0.9 % IV BOLUS (SEPSIS)
1000.0000 mL | Freq: Once | INTRAVENOUS | Status: AC
  Administered 2019-07-24: 1000 mL via INTRAVENOUS

## 2019-07-24 MED ORDER — THIAMINE HCL 100 MG PO TABS
100.0000 mg | ORAL_TABLET | Freq: Every day | ORAL | Status: DC
  Administered 2019-07-26 – 2019-07-31 (×6): 100 mg via ORAL
  Filled 2019-07-24 (×6): qty 1

## 2019-07-24 MED ORDER — LORAZEPAM 1 MG PO TABS
0.0000 mg | ORAL_TABLET | Freq: Four times a day (QID) | ORAL | Status: AC
  Administered 2019-07-25: 1 mg via ORAL
  Administered 2019-07-25: 4 mg via ORAL
  Filled 2019-07-24: qty 1
  Filled 2019-07-24: qty 4

## 2019-07-24 MED ORDER — GADOBUTROL 1 MMOL/ML IV SOLN
10.0000 mL | Freq: Once | INTRAVENOUS | Status: AC | PRN
  Administered 2019-07-24: 10 mL via INTRAVENOUS

## 2019-07-24 MED ORDER — METRONIDAZOLE IN NACL 5-0.79 MG/ML-% IV SOLN
500.0000 mg | Freq: Once | INTRAVENOUS | Status: AC
  Administered 2019-07-24: 500 mg via INTRAVENOUS
  Filled 2019-07-24: qty 100

## 2019-07-24 MED ORDER — VANCOMYCIN HCL IN DEXTROSE 1-5 GM/200ML-% IV SOLN
1000.0000 mg | Freq: Once | INTRAVENOUS | Status: AC
  Administered 2019-07-24: 1000 mg via INTRAVENOUS
  Filled 2019-07-24: qty 200

## 2019-07-24 MED ORDER — SODIUM CHLORIDE 0.9 % IV BOLUS
1000.0000 mL | Freq: Once | INTRAVENOUS | Status: AC
  Administered 2019-07-24: 1000 mL via INTRAVENOUS

## 2019-07-24 SURGICAL SUPPLY — 61 items
ADH SKN CLS APL DERMABOND .7 (GAUZE/BANDAGES/DRESSINGS) ×1
APL SKNCLS STERI-STRIP NONHPOA (GAUZE/BANDAGES/DRESSINGS) ×1
BAG DECANTER FOR FLEXI CONT (MISCELLANEOUS) ×3 IMPLANT
BENZOIN TINCTURE PRP APPL 2/3 (GAUZE/BANDAGES/DRESSINGS) ×3 IMPLANT
BLADE CLIPPER SURG (BLADE) IMPLANT
BLADE SURG 11 STRL SS (BLADE) ×3 IMPLANT
BUR CUTTER 7.0 ROUND (BURR) ×3 IMPLANT
BUR MATCHSTICK NEURO 3.0 LAGG (BURR) ×3 IMPLANT
CANISTER SUCT 3000ML PPV (MISCELLANEOUS) ×3 IMPLANT
CARTRIDGE OIL MAESTRO DRILL (MISCELLANEOUS) ×1 IMPLANT
CLOSURE STERI-STRIP 1/2X4 (GAUZE/BANDAGES/DRESSINGS) ×1
CLOSURE WOUND 1/2 X4 (GAUZE/BANDAGES/DRESSINGS) ×1
CLSR STERI-STRIP ANTIMIC 1/2X4 (GAUZE/BANDAGES/DRESSINGS) ×1 IMPLANT
COVER WAND RF STERILE (DRAPES) ×3 IMPLANT
DECANTER SPIKE VIAL GLASS SM (MISCELLANEOUS) ×3 IMPLANT
DERMABOND ADVANCED (GAUZE/BANDAGES/DRESSINGS) ×2
DERMABOND ADVANCED .7 DNX12 (GAUZE/BANDAGES/DRESSINGS) ×1 IMPLANT
DIFFUSER DRILL AIR PNEUMATIC (MISCELLANEOUS) ×3 IMPLANT
DRAPE HALF SHEET 40X57 (DRAPES) IMPLANT
DRAPE LAPAROTOMY 100X72X124 (DRAPES) ×3 IMPLANT
DRAPE MICROSCOPE LEICA (MISCELLANEOUS) ×3 IMPLANT
DRAPE SURG 17X23 STRL (DRAPES) ×3 IMPLANT
DURAPREP 26ML APPLICATOR (WOUND CARE) ×3 IMPLANT
ELECT REM PT RETURN 9FT ADLT (ELECTROSURGICAL) ×3
ELECTRODE REM PT RTRN 9FT ADLT (ELECTROSURGICAL) ×1 IMPLANT
GAUZE 4X4 16PLY RFD (DISPOSABLE) IMPLANT
GAUZE SPONGE 4X4 12PLY STRL (GAUZE/BANDAGES/DRESSINGS) ×3 IMPLANT
GLOVE BIO SURGEON STRL SZ 6.5 (GLOVE) ×1 IMPLANT
GLOVE BIO SURGEON STRL SZ7 (GLOVE) ×9 IMPLANT
GLOVE BIO SURGEON STRL SZ8 (GLOVE) ×3 IMPLANT
GLOVE BIO SURGEONS STRL SZ 6.5 (GLOVE) ×1
GLOVE BIOGEL PI IND STRL 7.0 (GLOVE) IMPLANT
GLOVE BIOGEL PI IND STRL 7.5 (GLOVE) IMPLANT
GLOVE BIOGEL PI INDICATOR 7.0 (GLOVE) ×2
GLOVE BIOGEL PI INDICATOR 7.5 (GLOVE) ×2
GLOVE EXAM NITRILE XL STR (GLOVE) IMPLANT
GLOVE INDICATOR 8.5 STRL (GLOVE) ×3 IMPLANT
GOWN STRL REUS W/ TWL LRG LVL3 (GOWN DISPOSABLE) ×1 IMPLANT
GOWN STRL REUS W/ TWL XL LVL3 (GOWN DISPOSABLE) ×2 IMPLANT
GOWN STRL REUS W/TWL 2XL LVL3 (GOWN DISPOSABLE) IMPLANT
GOWN STRL REUS W/TWL LRG LVL3 (GOWN DISPOSABLE) ×3
GOWN STRL REUS W/TWL XL LVL3 (GOWN DISPOSABLE) ×6
KIT BASIN OR (CUSTOM PROCEDURE TRAY) ×3 IMPLANT
KIT TURNOVER KIT B (KITS) ×3 IMPLANT
NDL SPNL 22GX3.5 QUINCKE BK (NEEDLE) ×1 IMPLANT
NEEDLE HYPO 22GX1.5 SAFETY (NEEDLE) ×3 IMPLANT
NEEDLE SPNL 22GX3.5 QUINCKE BK (NEEDLE) ×3 IMPLANT
NS IRRIG 1000ML POUR BTL (IV SOLUTION) ×3 IMPLANT
OIL CARTRIDGE MAESTRO DRILL (MISCELLANEOUS) ×3
PACK LAMINECTOMY NEURO (CUSTOM PROCEDURE TRAY) ×3 IMPLANT
RUBBERBAND STERILE (MISCELLANEOUS) ×6 IMPLANT
SPONGE SURGIFOAM ABS GEL SZ50 (HEMOSTASIS) ×3 IMPLANT
STRIP CLOSURE SKIN 1/2X4 (GAUZE/BANDAGES/DRESSINGS) ×2 IMPLANT
SUT VIC AB 0 CT1 18XCR BRD8 (SUTURE) ×1 IMPLANT
SUT VIC AB 0 CT1 8-18 (SUTURE) ×3
SUT VIC AB 2-0 CT1 18 (SUTURE) ×3 IMPLANT
SUT VICRYL 4-0 PS2 18IN ABS (SUTURE) ×3 IMPLANT
TAPE CLOTH SURG 4X10 WHT LF (GAUZE/BANDAGES/DRESSINGS) ×2 IMPLANT
TOWEL GREEN STERILE (TOWEL DISPOSABLE) ×3 IMPLANT
TOWEL GREEN STERILE FF (TOWEL DISPOSABLE) ×3 IMPLANT
WATER STERILE IRR 1000ML POUR (IV SOLUTION) ×3 IMPLANT

## 2019-07-24 NOTE — ED Notes (Signed)
While obtaining rectal temperature, abscess on right buttocks opened and began draining. Chrys Racer, [place] made aware of rectal temp of 100.6 and abscess.

## 2019-07-24 NOTE — ED Provider Notes (Signed)
[facility] DEPT Provider Note   CSN: [id number] Arrival date & time: 07/24/19  1139     History Chief Complaint  Patient presents with  . Leg Pain    [name] is a 53 y.o. male.  HPI      [name] is a 53 y.o. male, with a history of obesity and alcohol abuse, presenting to the ED originally complaining of leg pain beginning around December 9. He states while he was at work, he began to have burning pain in the left lateral and anterior hip with walking.  This seemed to worsen throughout the day. This pain continued to worsen and was only present with ambulation. About a week ago, he began to have progressive weakness in the bilateral lower extremities.  He also reports upper extremity weakness for the last several days.  Today, he noted he was unable to urinate. Adds he has had 30 lb weight loss since the beginning of December.  December 17: Patient was evaluated by Dr. Durward Fortes.  By this time, he was having burning pain with ambulation in bilateral hips.  He was prescribed a Medrol Dosepak, which the patient states initially seemed to help with his pain.  X-rays of the left hip and lumbar spine were unremarkable. December 22: At this point, patient was using crutches for locomotion due to pain in the hips and the back. December 23: Patient was evaluated by his PCP, Dr. Einar Pheasant.  CT of the abdomen/pelvis was unremarkable.  Patient noted to have leukocytosis of 17.3, CRP 191, ESR 130.  CK was normal.  Denies HIV, immunocompromise, IVDA.  Endorses routinely drinking twelve, 12oz beers daily, however, states he has not had any alcohol since his pain began towards the beginning of December. Denies fever/chills, N/V/D, incontinence, abdominal pain, extremity numbness, shortness of breath, chest pain, difficulty swallowing, vision changes, saddle anesthesias, falls/trauma, or any other complaints.   Past Medical History:  Diagnosis Date  . History  of chicken pox   . Medical history non-contributory     Patient Active Problem List   Diagnosis Date Noted  . Polymyalgia rheumatica ([place]) 07/24/2019  . Acute bilateral low back pain without sciatica 07/18/2019  . Abdominal pain 07/18/2019  . Anemia 07/18/2019  . Obesity (BMI 35.0-39.9 without comorbidity) 07/18/2019  . Constipation 07/18/2019  . Bilateral leg pain 07/12/2019  . Bilateral leg edema 04/01/2017  . Elevated blood sugar 04/01/2017  . Avascular necrosis of bone of hip, left ([place]) 10/12/2016  . Alcohol abuse, daily use 10/12/2016  . S/P total hip arthroplasty 10/12/2016    Past Surgical History:  Procedure Laterality Date  . CARPAL TUNNEL RELEASE Bilateral 2009  . COLONOSCOPY W/ POLYPECTOMY    . JOINT REPLACEMENT    . LACERATION REPAIR Right ~ 2012   leg  . TOTAL HIP ARTHROPLASTY Right 01/11/2013   Procedure: TOTAL HIP ARTHROPLASTY;  Surgeon: Garald Balding, MD;  Location: [facility];  Service: Orthopedics;  Laterality: Right;  . TOTAL HIP ARTHROPLASTY Left 10/12/2016   Procedure: TOTAL HIP ARTHROPLASTY;  Surgeon: Garald Balding, MD;  Location: [facility];  Service: Orthopedics;  Laterality: Left;       Family History  Problem Relation Age of Onset  . Cancer Father        Hodgkin's disease  . COPD Mother   . Heart attack Maternal Grandfather 80  . Diabetes Neg Hx   . Stroke Neg Hx   . Hypertension Neg Hx   .  Hyperlipidemia Neg Hx     Social History   Tobacco Use  . Smoking status: Former Smoker    Packs/day: 1.00    Years: 14.00    Pack years: 14.00    Types: Cigarettes    Quit date: 07/27/1999    Years since quitting: 20.0  . Smokeless tobacco: Never Used  Substance Use Topics  . Alcohol use: Yes    Alcohol/week: 24.0 standard drinks    Types: 24 Cans of beer per week    Comment: 4-5 beers per day, case of beer per week  . Drug use: No    Home Medications Prior to Admission medications   Medication Sig Start Date End Date Taking? Authorizing  Provider  cyclobenzaprine (FLEXERIL) 5 MG tablet Take 1 tablet (5 mg total) by mouth 3 (three) times daily as needed for muscle spasms. 07/18/19  Yes Lesleigh Noe, MD  ibuprofen (ADVIL) 200 MG tablet Take 800 mg by mouth every 8 (eight) hours as needed for moderate pain.   Yes [provider]  predniSONE (DELTASONE) 5 MG tablet Take 3 tablets (15 mg total) by mouth daily with breakfast. 07/24/19   Lesleigh Noe, MD    Allergies    Bee venom and Hydrocodone  Review of Systems   Review of Systems  Constitutional: Negative for chills, diaphoresis and fever.  HENT: Negative for trouble swallowing and voice change.   Eyes: Negative for visual disturbance.  Respiratory: Negative for cough and shortness of breath.   Cardiovascular: Negative for chest pain and leg swelling.  Gastrointestinal: Negative for abdominal pain, blood in stool, diarrhea, nausea and vomiting.  Genitourinary: Positive for difficulty urinating.  Musculoskeletal: Positive for arthralgias. Negative for neck pain.  Neurological: Positive for weakness. Negative for numbness.  All other systems reviewed and are negative.   Physical Exam Updated Vital Signs BP (!) 147/95 (BP Location: Right Arm)   Pulse (!) 102   Temp 98.5 F (36.9 C) (Oral)   Resp 16   SpO2 98%   Physical Exam Vitals and nursing note reviewed.  Constitutional:      General: He is not in acute distress.    Appearance: He is well-developed. He is not diaphoretic.  HENT:     Head: Normocephalic and atraumatic.     Mouth/Throat:     Mouth: Mucous membranes are moist.     Pharynx: Oropharynx is clear.  Eyes:     Conjunctiva/sclera: Conjunctivae normal.  Cardiovascular:     Rate and Rhythm: Normal rate and regular rhythm.     Pulses:          Radial pulses are 2+ on the right side and 2+ on the left side.       Femoral pulses are 2+ on the right side and 2+ on the left side.      Dorsalis pedis pulses are detected w/ Doppler on the  right side and detected w/ Doppler on the left side.       Posterior tibial pulses are detected w/ Doppler on the right side and detected w/ Doppler on the left side.     Heart sounds: Normal heart sounds.     Comments: Patient's feet are cool to the touch, however, capillary refill present in the toes, feet, and lower legs. Pulmonary:     Effort: Pulmonary effort is normal. No respiratory distress.     Breath sounds: Normal breath sounds.  Abdominal:     Palpations: Abdomen is soft.  Tenderness: There is no abdominal tenderness. There is no guarding.  Genitourinary:      Comments: Patient endorses sensation light touch intact in the perianal region. No notable rectal tone. Musculoskeletal:     Cervical back: Neck supple.     Right lower leg: Edema present.     Left lower leg: Edema present.     Comments: No tenderness in the upper or lower extremities.   No tenderness throughout the back or spine.  No swelling, deformity, or instability noted to the spine or extremities. No pain with range of motion in the major joints of the upper or lower extremities.  Lymphadenopathy:     Cervical: No cervical adenopathy.  Skin:    General: Skin is warm and dry.     Capillary Refill: Capillary refill takes 2 to 3 seconds.     Comments: Red-purple lesions noted primarily to the skin of the upper and lower extremities.  They are most notably present on the left calf and left forearm.  Patient states these lesions have started to form for the last 4 weeks.  Occasionally, he states is able to squeeze one and express pus from them.  The current lesions present are nontender and nonfluctuant on exam.  Neurological:     Mental Status: He is alert.     Deep Tendon Reflexes:     Reflex Scores:      Patellar reflexes are 0 on the right side and 0 on the left side.      Achilles reflexes are 0 on the right side and 0 on the left side.    Comments: Sensation light touch grossly intact in the upper and  lower extremities, equal bilaterally. Grip strengths equal and strong bilaterally. Strength 5/5 in the biceps/triceps and equal bilaterally. Strength 2/5 with right hip flexion, 1/5 in the right ankle. Strength 0/5 in the left hip, 0/5 in the left ankle.  No noted speech deficits. No aphasia. Patient handles oral secretions without difficulty. No noted swallowing defects.  Equal grip strength bilaterally. No arm drift. Coordination intact with finger to nose.  Cranial nerves III-XII grossly intact.  No noted visual field deficit. EOMs are intact and in sync. No noted eye or lid ptosis.  Pupils are PERRL. No facial droop.   Psychiatric:        Mood and Affect: Mood and affect normal.        Speech: Speech normal.        Behavior: Behavior normal.                     ED Results / Procedures / Treatments   Labs (all labs ordered are listed, but only abnormal results are displayed) Labs Reviewed  URINALYSIS, ROUTINE W REFLEX MICROSCOPIC - Abnormal; Notable for the following components:      Result Value   APPearance HAZY (*)    Hgb urine dipstick MODERATE (*)    Nitrite POSITIVE (*)    Leukocytes,Ua TRACE (*)    Bacteria, UA FEW (*)    All other components within normal limits  COMPREHENSIVE METABOLIC PANEL - Abnormal; Notable for the following components:   Sodium 128 (*)    Potassium 3.4 (*)    Chloride 90 (*)    Glucose, Bld 105 (*)    Total Protein 8.6 (*)    Albumin 2.7 (*)    ALT 49 (*)    All other components within normal limits  CBC WITH DIFFERENTIAL/PLATELET - Abnormal;  Notable for the following components:   WBC 19.9 (*)    RBC 4.13 (*)    Platelets 407 (*)    Neutro Abs 17.9 (*)    Abs Immature Granulocytes 0.20 (*)    All other components within normal limits  CULTURE, BLOOD (ROUTINE X 2)  CULTURE, BLOOD (ROUTINE X 2)  URINE CULTURE  RAPID URINE DRUG SCREEN, HOSP PERFORMED  SEDIMENTATION RATE  C-REACTIVE PROTEIN  RPR  HIV ANTIBODY  (ROUTINE TESTING W REFLEX)  LACTIC ACID, PLASMA  LACTIC ACID, PLASMA  APTT  PROTIME-INR  ETHANOL  GC/CHLAMYDIA PROBE AMP ([place]) NOT AT [facility]    EKG None  Radiology No results found.  Procedures Procedures (including critical care time)  Medications Ordered in ED Medications  sodium chloride 0.9 % bolus 1,000 mL (has no administration in time range)  ceFEPIme (MAXIPIME) 2 g in sodium chloride 0.9 % 100 mL IVPB (has no administration in time range)  metroNIDAZOLE (FLAGYL) IVPB 500 mg (has no administration in time range)  vancomycin (VANCOCIN) IVPB 1000 mg/200 mL premix (has no administration in time range)  LORazepam (ATIVAN) injection 0-4 mg (has no administration in time range)    Or  LORazepam (ATIVAN) tablet 0-4 mg (has no administration in time range)  LORazepam (ATIVAN) injection 0-4 mg (has no administration in time range)    Or  LORazepam (ATIVAN) tablet 0-4 mg (has no administration in time range)  thiamine tablet 100 mg (has no administration in time range)    Or  thiamine (B-1) injection 100 mg (has no administration in time range)  sodium chloride 0.9 % bolus 1,000 mL (1,000 mLs Intravenous New Bag/Given 07/24/19 1640)    ED Course  I have reviewed the triage vital signs and the nursing notes.  Pertinent labs & imaging results that were available during my care of the patient were reviewed by me and considered in my medical decision making (see chart for details).    MDM Rules/Calculators/A&P                      Patient presents initially just complaining of pain in the left hip with ambulation. Patient is nontoxic appearing, afebrile during initial assessment, not tachycardic on my exam, not tachypneic, not hypotensive, maintains excellent SPO2 on room air, and is in no apparent distress.  He was actually found to have weakness in the lower extremities with absence of reflexes, urinary retention of about 1500 cc of urine, and lack of rectal tone.  His  presentation is highly suspicious for spinal cord abnormality, such as cauda equina syndrome. Leukocytosis of 19.9.  Hyponatremia noted and addressed with IV fluids.  Findings and plan of care discussed with Deno Etienne, DO. Dr. Tyrone Nine personally evaluated and examined this patient.  End of shift patient care handoff report given to Charmaine Downs, PA-C. Plan: Patient awaiting the remainder of his lab work and MRIs.   Final Clinical Impression(s) / ED Diagnoses Final diagnoses:  None    Rx / DC Orders ED Discharge Orders    None       Layla Maw 07/24/19 [place], [place], DO 08/03/19 0109

## 2019-07-24 NOTE — Progress Notes (Signed)
    I connected with [name] on 07/24/19 at  8:40 AM EST by video and verified that I am speaking with the correct person using two identifiers.   I discussed the limitations, risks, security and privacy concerns of performing an evaluation and management service by video/Telephone and the availability of in person appointments. I also discussed with the patient that there may be a patient responsible charge related to this service. The patient expressed understanding and agreed to proceed.  Patient location: Home Provider Location: [facility] Participants: Lesleigh Noe and [name]   Subjective:     [name] is a 53 y.o. male presenting for Follow-up (on results and pain.)     HPI   #Back/Leg pain - Everything is worse than it was when he saw me - pain is "everywhere" - leg pain is not significantly worse - tried muscle relaxant - no improvement in symptoms - Taking ibuprofen - no improvement but taking regularly - unable to walk - even with crutches - walking limited primarily by back and stomach pain - does not recall any recent tick bites   Review of Systems  Constitutional: Negative for chills and fever.     Social History   Tobacco Use  Smoking Status Former Smoker  . Packs/day: 1.00  . Years: 14.00  . Pack years: 14.00  . Types: Cigarettes  . Quit date: 07/27/1999  . Years since quitting: 20.0  Smokeless Tobacco Never Used        Objective:   BP Readings from Last 3 Encounters:  07/18/19 124/82  02/05/19 130/78  11/12/16 (!) 133/97   Wt Readings from Last 3 Encounters:  07/17/19 260 lb (117.9 kg)  07/12/19 260 lb (117.9 kg)  02/05/19 266 lb 4 oz (120.8 kg)    There were no vitals taken for this visit.  Phone Visit:  Speaking in complete sentences No distress Somewhat frustrated by pain      Assessment & Plan:   Problem List Items Addressed This Visit      Other   Acute bilateral low back pain without  sciatica   Relevant Medications   predniSONE (DELTASONE) 5 MG tablet   Polymyalgia rheumatica ([facility]) - Primary    Given initial presentation of leg/hip pain which improved minimally with steroids and elevated ESR and CRP with normal CT scan and normal XR suspect this may be the cause of his symptoms. Will restart steroids and reassess in 2 weeks. If improvement will titrate to treat. Will also follow-up HLA-B27 testing. Discuss risks of long term steroids. Pt agrees to try medication.       Relevant Medications   predniSONE (DELTASONE) 5 MG tablet     Interactive audio and video telecommunications were attempted between this provider and patient, however failed, due to patient having technical difficulties OR patient did not have access to video capability.  We continued and completed visit with audio only.   Start Time: 8:46 End Time: 8:55     Return in about 4 weeks (around 08/21/2019).  Lesleigh Noe, MD

## 2019-07-24 NOTE — ED Triage Notes (Signed)
Per EMS, patient from home, c/o left leg pain x4 weeks that started while at work. Reports worsening pain to leg. Seen by PCP for same multiple times. States taking prednisone without relief. A&Ox4.

## 2019-07-24 NOTE — Assessment & Plan Note (Signed)
Given initial presentation of leg/hip pain which improved minimally with steroids and elevated ESR and CRP with normal CT scan and normal XR suspect this may be the cause of his symptoms. Will restart steroids and reassess in 2 weeks. If improvement will titrate to treat. Will also follow-up HLA-B27 testing. Discuss risks of long term steroids. Pt agrees to try medication.

## 2019-07-24 NOTE — Progress Notes (Signed)
A consult was received from an ED physician for vanc/cefepime per pharmacy dosing.  The patient's profile has been reviewed for ht/wt/allergies/indication/available labs.   A one time order has been placed for vanc 1g and cefepime 2g.  Further antibiotics/pharmacy consults should be ordered by admitting physician if indicated.                       Thank you, Kara Mead 07/24/2019  4:58 PM

## 2019-07-24 NOTE — ED Notes (Signed)
Patient being taken to MRI at this time.

## 2019-07-24 NOTE — Patient Instructions (Signed)
Zaion - below is some information about Polymyalgia Rheumatica. I'm not certain this is what you have, how you respond to the prednisone will help with the diagnosis   1) Start daily Prednisone  2) MyChart follow-up in 2 weeks -- if improvement but not perfect we can increase the dose. If no improvement we will need to consider alternative diagnosis -- maybe consider a rheumatology referral 3) If improving then we will plan for a follow-up visit in 4 weeks    Polymyalgia Rheumatica Polymyalgia rheumatica (PMR) is an inflammatory disorder that causes the muscles and joints to ache and become stiff. Sometimes, PMR leads to a more dangerous condition that can cause vision loss (temporal arteritis or giant cell arteritis). What are the causes? The exact cause of PMR is not known. What increases the risk? You are more likely to develop this condition if you are:  Male.  84 years of age or older.  Caucasian. What are the signs or symptoms? Pain and stiffness are the main symptoms of PMR. Symptoms may:  Be worse after inactivity and in the morning.  Affect your: ? Hips, buttocks, and thighs. ? Neck, arms, and shoulders. This can make it hard to raise your arms above your head. ? Hands and wrists. Other symptoms include:  Fever.  Tiredness.  Weakness.  Depression.  Decreased appetite. This may lead to weight loss. Symptoms may start slowly or suddenly. How is this diagnosed? This condition is diagnosed with your medical history and a physical exam. You may need to see a health care provider who specializes in diseases of the joints, muscles, and bones (rheumatologist). You may also have tests, including:  Blood tests.  X-rays.  Ultrasound. How is this treated? PMR usually goes away without treatment, but it may take years. Your health care provider may recommend low-dose steroids and other medicines to help manage your symptoms of pain and stiffness. Regular exercise and  rest will also help your symptoms. Follow these instructions at home:   Take over-the-counter and prescription medicines only as told by your health care provider.  Make sure to get enough rest and sleep.  Eat a healthy and nutritious diet.  Try to exercise most days of the week. Ask your health care provider what type of exercise is best for you.  Keep all follow-up visits as told by your health care provider. This is important. Contact a health care provider if:  Your symptoms do not improve with medicine.  You have side effects from steroids. These may include: ? Weight gain. ? Swelling. ? Insomnia. ? Mood changes. ? Bruising. ? High blood sugar readings, if you have diabetes. ? Higher than normal blood pressure readings, if you monitor your blood pressure. Get help right away if:  You develop symptoms of temporal arteritis, such as: ? A change in vision. ? Severe headache. ? Scalp pain. ? Jaw pain. Summary  Polymyalgia rheumatica is an inflammatory disorder that causes aching and stiffness in your muscles and joints.  The exact cause of this condition is not known.  This condition usually goes away without treatment. Your health care provider may give you low-dose steroids to help manage your pain and stiffness.  Rest and regular exercise will help the symptoms. This information is not intended to replace advice given to you by your health care provider. Make sure you discuss any questions you have with your health care provider. Document Released: 08/19/2004 Document Revised: 05/18/2018 Document Reviewed: 05/18/2018 Elsevier Patient Education  2020  [employer].

## 2019-07-24 NOTE — ED Notes (Signed)
Patient transported to [facility] via EMS. Patient stable at time of transport.

## 2019-07-24 NOTE — ED Provider Notes (Signed)
Assumed care from [facility], PA-C at shift change. See his note for full HPI.  In short, patient with a past medical history significant for obesity and alcohol abuse presents to the ED due to worsening left hip/leg pain x4 weeks. A note was left by his friend Calton Golds, RN which notes "[name] presents with increased confusion and lethargy. Starting approximately 3 weeks ago. [place] has had increased pain in legs and abdomen. Initial injury related to heavy lifting. [place] has had one run of methylprednisolone with no relief. Patient also complains of orange colored urine. Lab tests from 12/24 show increased CRP and WBC. Patient is unable to bathe or use restroom unassisted at this point. The increased confusion has started approximately 5 days ago. Patient history includes depression and alcohol abuse. Per family patient hasn't eaten anything in 3 days".  Patient presents with worsening left hip pain. He has been evaluated numerous times noted in previous provider's note. Patient notes that 1 week ago he began to have progressive weakness in bilateral legs. Patient endorses inability to urinate starting last night around 11:30pm. His last BM was yesterday around lunch which he states was normal. Patient denies IVDA, fever, and chills. Patient does not have a history of HIV or any immunocompromised state. Patient admits to chronic alcohol use, but notes he has only had 4 total millers in the past 2 weeks.   Previous provider was concerned about possible cauda equina given his bilateral lower extremity weakness, lack of patellar reflex, and no rectal tone so an MRI was ordered. Patient was also found during ED stay to have urinary retention of 1500cc of urine.    Physical Exam  BP 120/89 (BP Location: Right Arm)   Pulse 88   Temp (!) 100.6 F (38.1 C) (Rectal)   Resp 17   Ht '6\' 1"'  (1.854 m)   Wt 106.6 kg   SpO2 96%   BMI 31.00 kg/m   Physical Exam Vitals and nursing note reviewed.   Constitutional:      General: He is not in acute distress.    Appearance: He is ill-appearing.  HENT:     Head: Normocephalic.  Eyes:     Pupils: Pupils are equal, round, and reactive to light.  Cardiovascular:     Rate and Rhythm: Normal rate and regular rhythm.     Pulses:          Radial pulses are 2+ on the right side and 2+ on the left side.       Femoral pulses are 2+ on the right side and 2+ on the left side.      Popliteal pulses are 2+ on the right side and 2+ on the left side.       Dorsalis pedis pulses are detected w/ Doppler on the right side and detected w/ Doppler on the left side.       Posterior tibial pulses are detected w/ Doppler on the right side and detected w/ Doppler on the left side.     Heart sounds: Normal heart sounds. No murmur. No friction rub. No gallop.      Comments: Previous provider detected DP/PT pulses with doppler Pulmonary:     Effort: Pulmonary effort is normal.     Breath sounds: Normal breath sounds.  Abdominal:     General: Abdomen is flat. Bowel sounds are normal. There is no distension.     Palpations: Abdomen is soft.     Tenderness: There is no  abdominal tenderness. There is no guarding or rebound.  Genitourinary:    Comments: Previous provider performed rectal exam which demonstrated no rectal tone.  Musculoskeletal:     Cervical back: Neck supple.     Comments: Patient able to move lower extremity very minimally off the bed. Sensation grossly intact bilaterally.   Skin:    Comments: Numerous abscesses located on upper and lower extremities. See previous providers note for pictures.    Neurological:     General: No focal deficit present.     Mental Status: He is alert.     GCS: GCS eye subscore is 4. GCS verbal subscore is 5. GCS motor subscore is 6.     Deep Tendon Reflexes:     Reflex Scores:      Patellar reflexes are 0 on the right side and 0 on the left side.      Achilles reflexes are 0 on the right side and 0 on the left  side.    Comments: Speech is clear, able to follow commands CN III-XII intact 5/5 strength upper extremity bilaterally with equal grip strength Left leg: hip flexion 2/5, quadriceps 3/5, left foot drop, 2/5 dorsiflexion, 3/5 plantar flexion Right leg: hip flexion 3/5, quadriceps 3/5, dorsiflexion & plantar flexion 5/5 Sensation grossly intact throughout       ED Course/Procedures   Clinical Course as of Jul 24 136  Tue Jul 24, 2019  2131 CT personally reviewed which demonstrates:  1. Findings consistent with acute osteomyelitis centered about the L3-4 interspace as above. Associated epidural abscess extending from L3-4 through L4-5 with resultant severe diffuse spinal stenosis. 2. Associated paraspinous soft tissue infection with bilateral psoas muscle abscesses, right greater than left. 3. No other evidence for acute or distant infection elsewhere within the thoracic or lumbar spine. 4. Underlying mild to moderate multilevel degenerative spondylosis as above. No other high-grade stenosis or impingement.   [CA]  2242 Spoke to Dr. Shelba Flake with neurosurgery who agrees to evaluate patient at bedside   [CA]  2316 Spoke to Dr. Saintclair Halsted with neurosurgery who wants patient transported to [facility] for emergent surgery. Patient states he has not eaten anything in 3 days. Patient had ice chips here in the ED   [CA]    Clinical Course User Index [CA] Suzy Bouchard, PA-C    .Critical Care Performed by: Suzy Bouchard, PA-C Authorized by: Suzy Bouchard, PA-C   Critical care provider statement:    Critical care time (minutes):  45   Critical care time was exclusive of:  Separately billable procedures and treating other patients and teaching time   Critical care was necessary to treat or prevent imminent or life-threatening deterioration of the following conditions:  Sepsis   Critical care was time spent personally by me on the following activities:  Discussions with  consultants, evaluation of patient's response to treatment, examination of patient, ordering and performing treatments and interventions, ordering and review of laboratory studies, ordering and review of radiographic studies, pulse oximetry, re-evaluation of patient's condition, obtaining history from patient or surrogate and review of old charts   I assumed direction of critical care for this patient from another provider in my specialty: no      MDM  10:44 PM Dr. Tamera Punt received call that patient was febrile. Called Code sepsis. Previous provider does not believe urine is the source given his neurological deficits. Will cover for unknown source pending MRI results. Previous provider worried about some spinal cord infection/abscess.  Care assumed from previous provider at shift change with MRI pending due to concerned of cauda equina. During patient's ED stay, he became febrile, so code sepsis was called given his WBC and initial presentation of tachycardia. Patient was started on IV abx. Sepsis labs ordered. Patient is not hypotensive and 1L was given by previous provider. Will place order for additional 1L for IVFs.   ESR elevated at 114. CBC significant for leukocytosis of 19.9. CMP reassuring with normal renal function, but mild hyponatremia at 128 and hypokalemia at 3.4. PT elevated at 17.6. INR elevated at 1.5. Lactic acid normal at 1.0. UA significant for positive nitrites, trace leukocytes, and few bacteria. Previous provider was not as concerned for UTI as believes urine could be due to urinary retention. UDS completely negative. CXR personally reviewed which is negative for signs of pneumonia.   MR images personally reviewed and discussed results with radiologist which demonstrates: 1. Findings consistent with acute osteomyelitis centered about the L3-4 interspace as above. Associated epidural abscess extending from L3-4 through L4-5 with resultant severe diffuse spinal stenosis. 2.  Associated paraspinous soft tissue infection with bilateral psoas muscle abscesses, right greater than left. 3. No other evidence for acute or distant infection elsewhere within the thoracic or lumbar spine. 4. Underlying mild to moderate multilevel degenerative spondylosis as above. No other high-grade stenosis or impingement.  Consult to neurosurgery. See note above. Patient transported to [facility] for emergent decompression.          Suzy Bouchard, PA-C 07/25/19 1017    Malvin Johns, MD 07/25/19 [phone]

## 2019-07-24 NOTE — ED Notes (Signed)
>  952mL on bladder scan. PA notified.

## 2019-07-24 NOTE — Telephone Encounter (Signed)
See encounter from today.

## 2019-07-24 NOTE — Anesthesia Preprocedure Evaluation (Addendum)
Anesthesia Evaluation  Patient identified by MRN, date of birth, ID band Patient awake    Reviewed: Allergy & Precautions, NPO status , Patient's Chart, lab work & pertinent test results  History of Anesthesia Complications Negative for: history of anesthetic complications  Airway Mallampati: II  TM Distance: >3 FB Neck ROM: Full    Dental  (+) Dental Advisory Given, Chipped   Pulmonary former smoker,    Pulmonary exam normal        Cardiovascular negative cardio ROS Normal cardiovascular exam     Neuro/Psych  Epidural abscess  negative psych ROS   GI/Hepatic negative GI ROS, (+)     substance abuse  alcohol use,   Endo/Other   Obesity Hyponatremia   Renal/GU negative Renal ROS     Musculoskeletal  Polymyalgia rheumatica    Abdominal   Peds  Hematology negative hematology ROS (+)   Anesthesia Other Findings   Reproductive/Obstetrics                            Anesthesia Physical Anesthesia Plan  ASA: III and emergent  Anesthesia Plan: General   Post-op Pain Management:    Induction: Intravenous  PONV Risk Score and Plan: 3 and Treatment may vary due to age or medical condition, Ondansetron, Midazolam and Scopolamine patch - Pre-op  Airway Management Planned: Oral ETT  Additional Equipment: None  Intra-op Plan:   Post-operative Plan: Extubation in OR  Informed Consent: I have reviewed the patients History and Physical, chart, labs and discussed the procedure including the risks, benefits and alternatives for the proposed anesthesia with the patient or authorized representative who has indicated his/her understanding and acceptance.     Dental advisory given  Plan Discussed with: CRNA and Anesthesiologist  Anesthesia Plan Comments:        Anesthesia Quick Evaluation

## 2019-07-25 ENCOUNTER — Emergency Department (HOSPITAL_COMMUNITY): Payer: BC Managed Care – PPO | Admitting: Anesthesiology

## 2019-07-25 ENCOUNTER — Inpatient Hospital Stay (HOSPITAL_COMMUNITY): Payer: BC Managed Care – PPO

## 2019-07-25 DIAGNOSIS — I081 Rheumatic disorders of both mitral and tricuspid valves: Secondary | ICD-10-CM | POA: Diagnosis present

## 2019-07-25 DIAGNOSIS — M549 Dorsalgia, unspecified: Secondary | ICD-10-CM | POA: Diagnosis present

## 2019-07-25 DIAGNOSIS — M4646 Discitis, unspecified, lumbar region: Secondary | ICD-10-CM | POA: Diagnosis present

## 2019-07-25 DIAGNOSIS — E871 Hypo-osmolality and hyponatremia: Secondary | ICD-10-CM | POA: Diagnosis not present

## 2019-07-25 DIAGNOSIS — D649 Anemia, unspecified: Secondary | ICD-10-CM | POA: Diagnosis not present

## 2019-07-25 DIAGNOSIS — K6812 Psoas muscle abscess: Secondary | ICD-10-CM | POA: Diagnosis present

## 2019-07-25 DIAGNOSIS — Z888 Allergy status to other drugs, medicaments and biological substances status: Secondary | ICD-10-CM | POA: Diagnosis not present

## 2019-07-25 DIAGNOSIS — Z6831 Body mass index (BMI) 31.0-31.9, adult: Secondary | ICD-10-CM | POA: Diagnosis not present

## 2019-07-25 DIAGNOSIS — M48061 Spinal stenosis, lumbar region without neurogenic claudication: Secondary | ICD-10-CM | POA: Diagnosis present

## 2019-07-25 DIAGNOSIS — Z7952 Long term (current) use of systemic steroids: Secondary | ICD-10-CM | POA: Diagnosis not present

## 2019-07-25 DIAGNOSIS — M21372 Foot drop, left foot: Secondary | ICD-10-CM | POA: Diagnosis present

## 2019-07-25 DIAGNOSIS — Z4789 Encounter for other orthopedic aftercare: Secondary | ICD-10-CM | POA: Diagnosis not present

## 2019-07-25 DIAGNOSIS — R7881 Bacteremia: Secondary | ICD-10-CM | POA: Diagnosis not present

## 2019-07-25 DIAGNOSIS — F1020 Alcohol dependence, uncomplicated: Secondary | ICD-10-CM | POA: Diagnosis present

## 2019-07-25 DIAGNOSIS — B9561 Methicillin susceptible Staphylococcus aureus infection as the cause of diseases classified elsewhere: Secondary | ICD-10-CM | POA: Diagnosis not present

## 2019-07-25 DIAGNOSIS — Z9889 Other specified postprocedural states: Secondary | ICD-10-CM

## 2019-07-25 DIAGNOSIS — E669 Obesity, unspecified: Secondary | ICD-10-CM | POA: Diagnosis present

## 2019-07-25 DIAGNOSIS — L089 Local infection of the skin and subcutaneous tissue, unspecified: Secondary | ICD-10-CM | POA: Diagnosis present

## 2019-07-25 DIAGNOSIS — M353 Polymyalgia rheumatica: Secondary | ICD-10-CM | POA: Diagnosis not present

## 2019-07-25 DIAGNOSIS — M4626 Osteomyelitis of vertebra, lumbar region: Secondary | ICD-10-CM | POA: Diagnosis present

## 2019-07-25 DIAGNOSIS — I669 Occlusion and stenosis of unspecified cerebral artery: Secondary | ICD-10-CM | POA: Diagnosis present

## 2019-07-25 DIAGNOSIS — Z91030 Bee allergy status: Secondary | ICD-10-CM | POA: Diagnosis not present

## 2019-07-25 DIAGNOSIS — Z96643 Presence of artificial hip joint, bilateral: Secondary | ICD-10-CM | POA: Diagnosis present

## 2019-07-25 DIAGNOSIS — Z20822 Contact with and (suspected) exposure to covid-19: Secondary | ICD-10-CM | POA: Diagnosis present

## 2019-07-25 DIAGNOSIS — Z87891 Personal history of nicotine dependence: Secondary | ICD-10-CM | POA: Diagnosis not present

## 2019-07-25 DIAGNOSIS — G061 Intraspinal abscess and granuloma: Secondary | ICD-10-CM

## 2019-07-25 DIAGNOSIS — A4101 Sepsis due to Methicillin susceptible Staphylococcus aureus: Secondary | ICD-10-CM | POA: Diagnosis present

## 2019-07-25 DIAGNOSIS — G834 Cauda equina syndrome: Secondary | ICD-10-CM | POA: Diagnosis not present

## 2019-07-25 HISTORY — DX: Intraspinal abscess and granuloma: G06.1

## 2019-07-25 LAB — BLOOD CULTURE ID PANEL (REFLEXED)

## 2019-07-25 LAB — RPR: RPR Ser Ql: NONREACTIVE

## 2019-07-25 IMAGING — CT CT GUIDANCE NEEDLE PLACEMENT
1 of 2 series · 15 of 32 positions shown, 19 images · non-contrast
Comparison: none

INDICATION: Lumbar spondylo discitis and retroperitoneal right psoas abscess

[Series 2: i-spiral 5.0 b40f · axial · 0.86mm/px · z∈[+1291,+1508]mm · 15 of 68 slices shown, 19 images]
[im 3/68  soft-tissue]
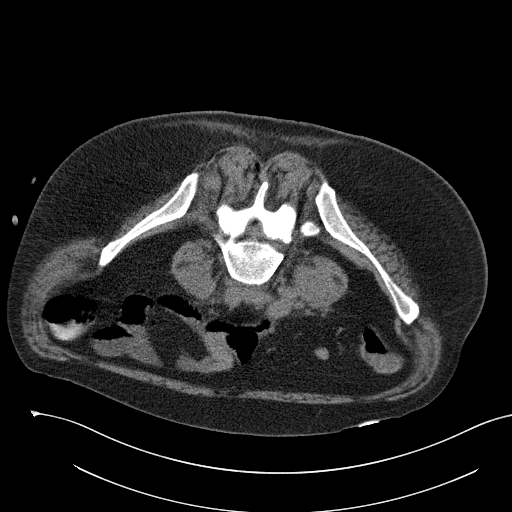
[im 3/68  bone]
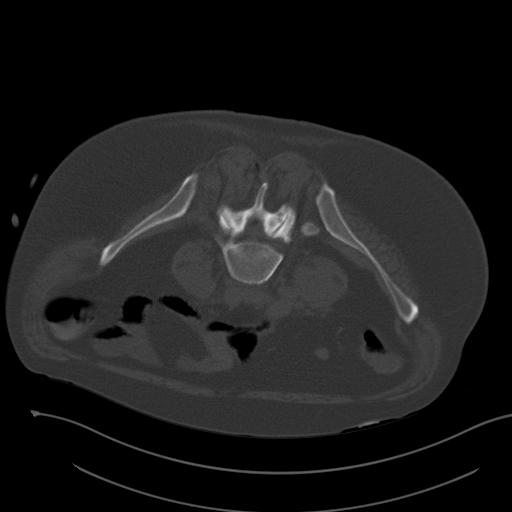
[im 8/68  soft-tissue]
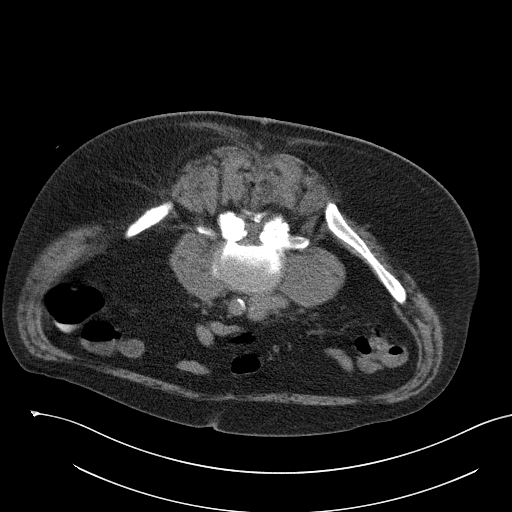
[im 13/68  soft-tissue]
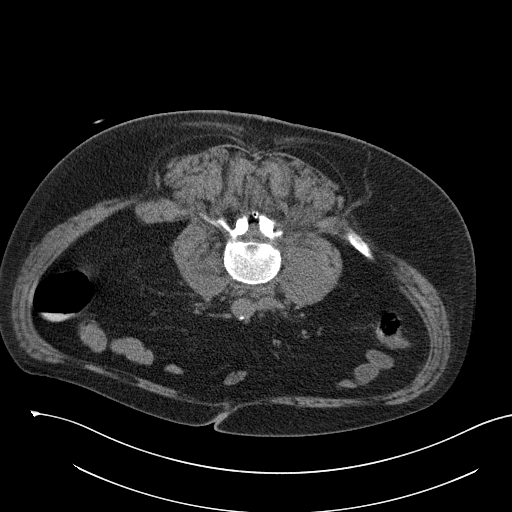
[im 19/68  soft-tissue]
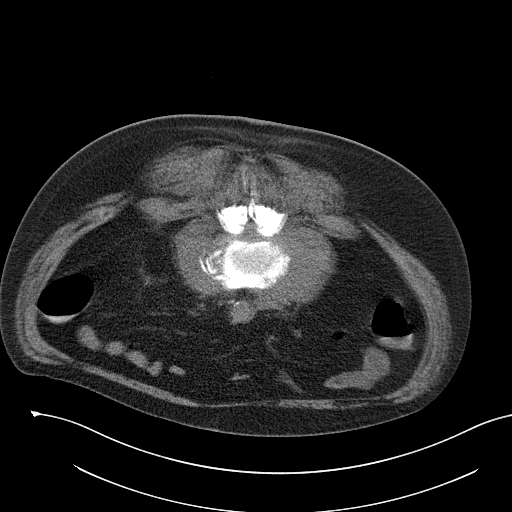
[im 24/68  soft-tissue]
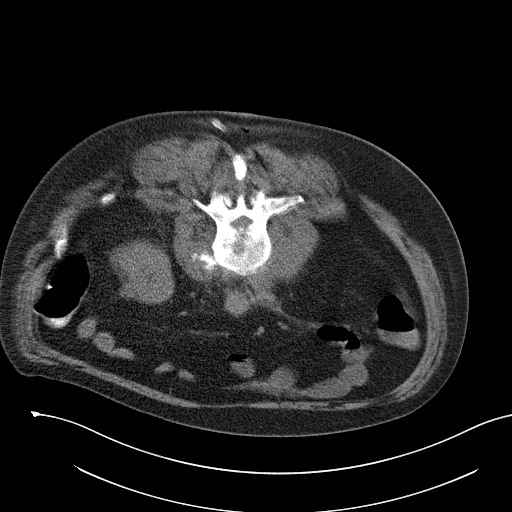
[im 29/68  soft-tissue]
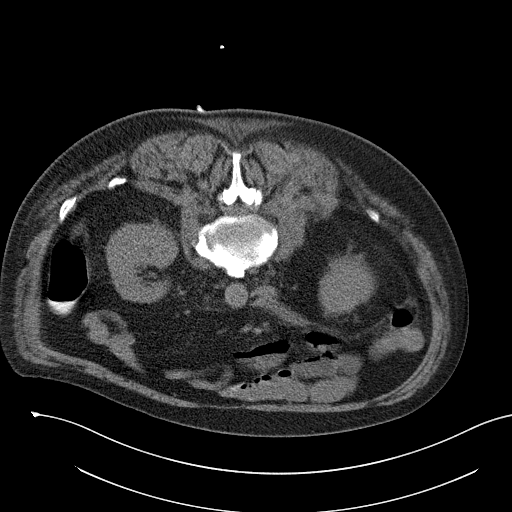
[im 34/68  soft-tissue]
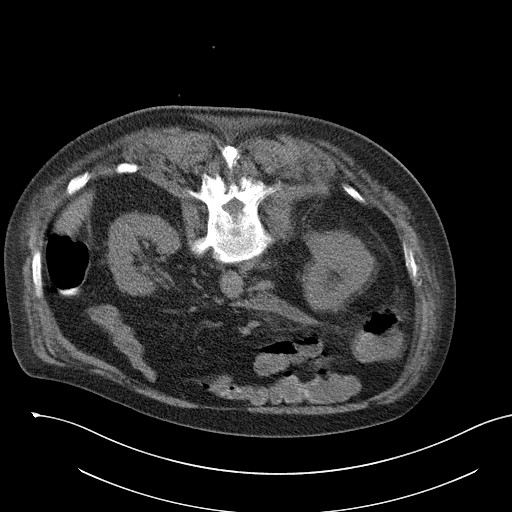
[im 39/68  soft-tissue]
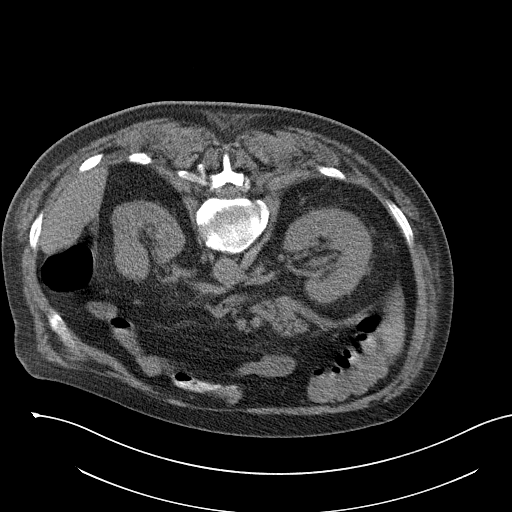
[im 44/68  soft-tissue]
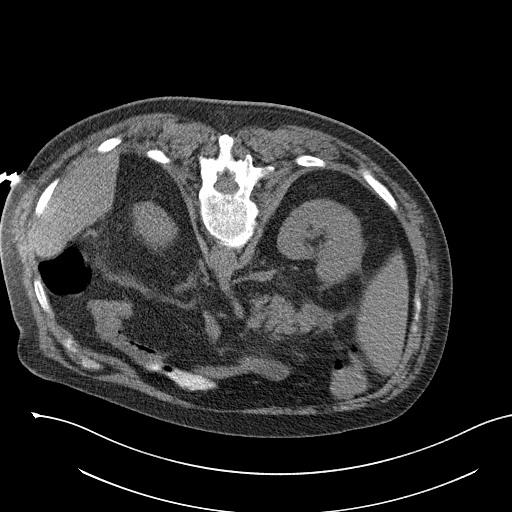
[im 44/68  bone]
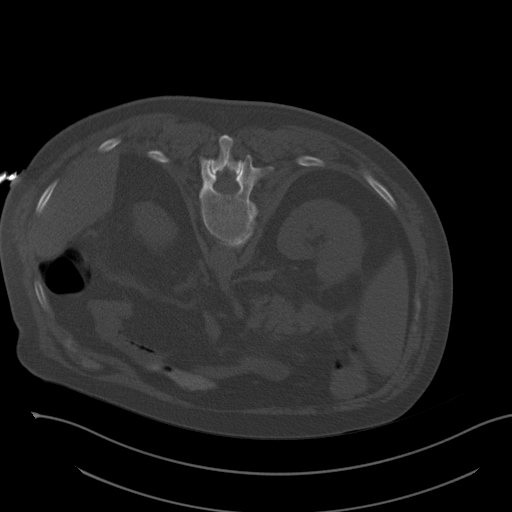
[im 49/68  soft-tissue]
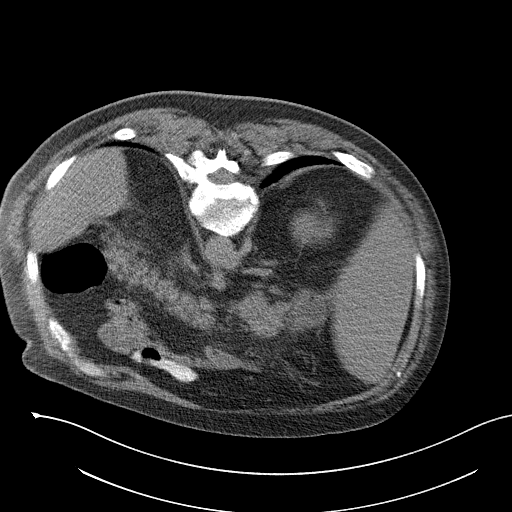
[im 55/68  soft-tissue]
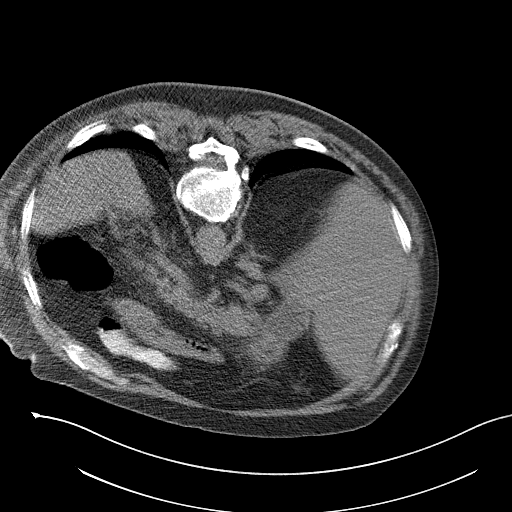
[im 57/68  lung]
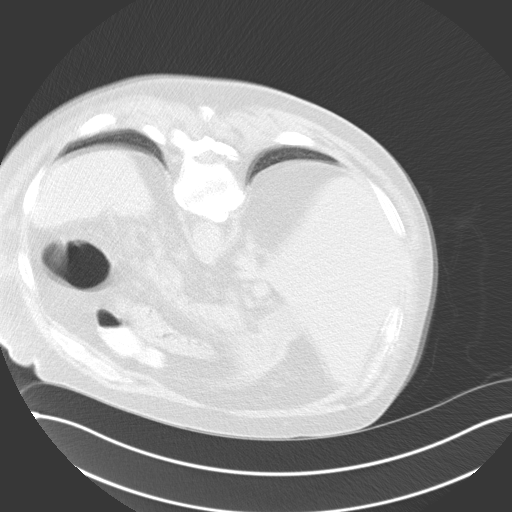
[im 60/68  soft-tissue]
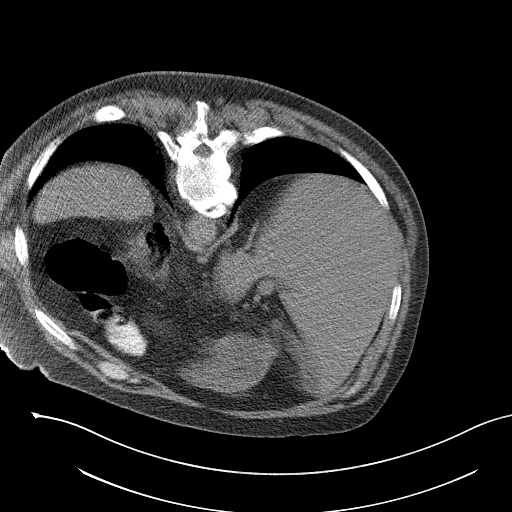
[im 60/68  lung]
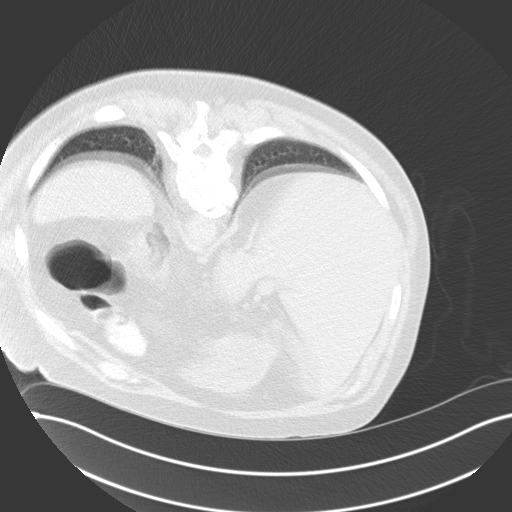
[im 62/68  lung]
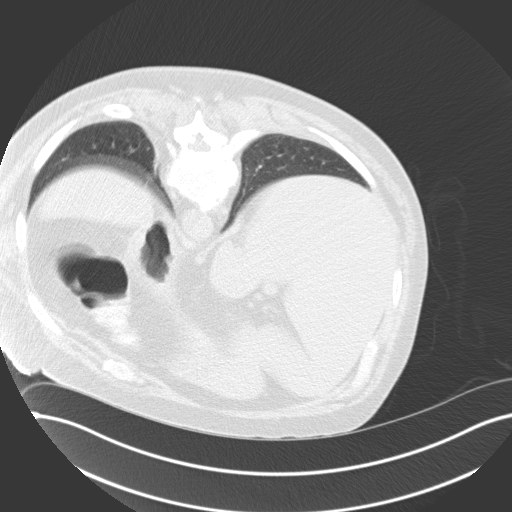
[im 65/68  soft-tissue]
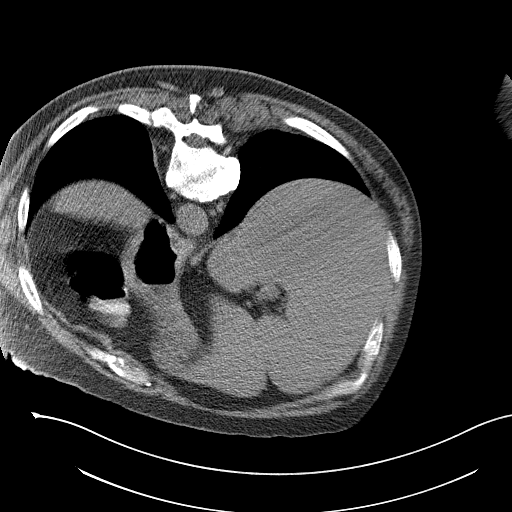
[im 65/68  lung]
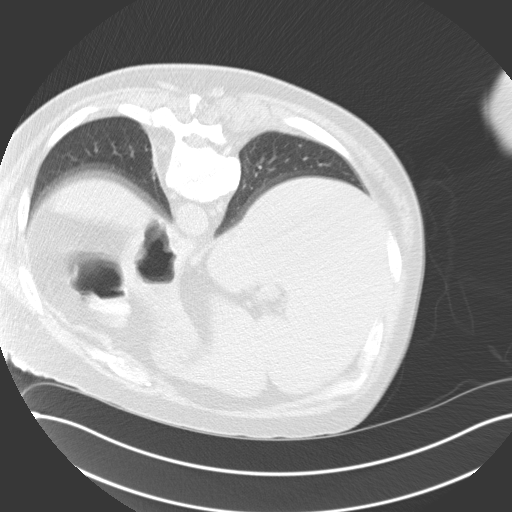

[15 of 32 positions shown; findings below may reference images not displayed]

EXAM:
CT NEEDLE ASPIRATION RIGHT PSOAS ABSCESS

MEDICATIONS:
The patient is currently admitted to the hospital and receiving
intravenous antibiotics. The antibiotics were administered within an
appropriate time frame prior to the initiation of the procedure.

ANESTHESIA/SEDATION:
Fentanyl 100 mcg IV; Versed 2.0 mg IV

Moderate Sedation Time:  10 MINUTES

The patient was continuously monitored during the procedure by the
interventional radiology nurse under my direct supervision.

COMPLICATIONS:
None immediate.

PROCEDURE:
Informed written consent was obtained from the patient after a
thorough discussion of the procedural risks, benefits and
alternatives. All questions were addressed. Maximal Sterile Barrier
Technique was utilized including caps, mask, sterile gowns, sterile
gloves, sterile drape, hand hygiene and skin antiseptic. A timeout
was performed prior to the initiation of the procedure.

Previous imaging reviewed. Patient position prone. Noncontrast
localization CT performed. The right psoas muscle fluid collection
was localized and marked for a posterior approach.

Under sterile conditions and local anesthesia, a 17 gauge 11.8 cm
access was advanced from a posterior paraspinous approach into the
fluid collection. Needle position confirmed with CT. Syringe
aspiration yielded 30 cc purulent fluid. Sample sent for culture.
Needle removed. Postprocedure imaging demonstrates no hemorrhage or
hematoma. Patient tolerated the procedure well.
IMPRESSION: Successful CT-guided needle aspiration of the right psoas muscle
paraspinous abscess.

## 2019-07-25 IMAGING — CR DG LUMBAR SPINE 2-3V
2 series · 2 of 2 positions shown · non-contrast
Comparison: Lumbar spine MRI yesterday.

CLINICAL DATA: Decompression laminectomy L3-L4 for epidural
abscess.

EXAM:
LUMBAR SPINE - 2-3 VIEW

[lateral (1 of 2)]
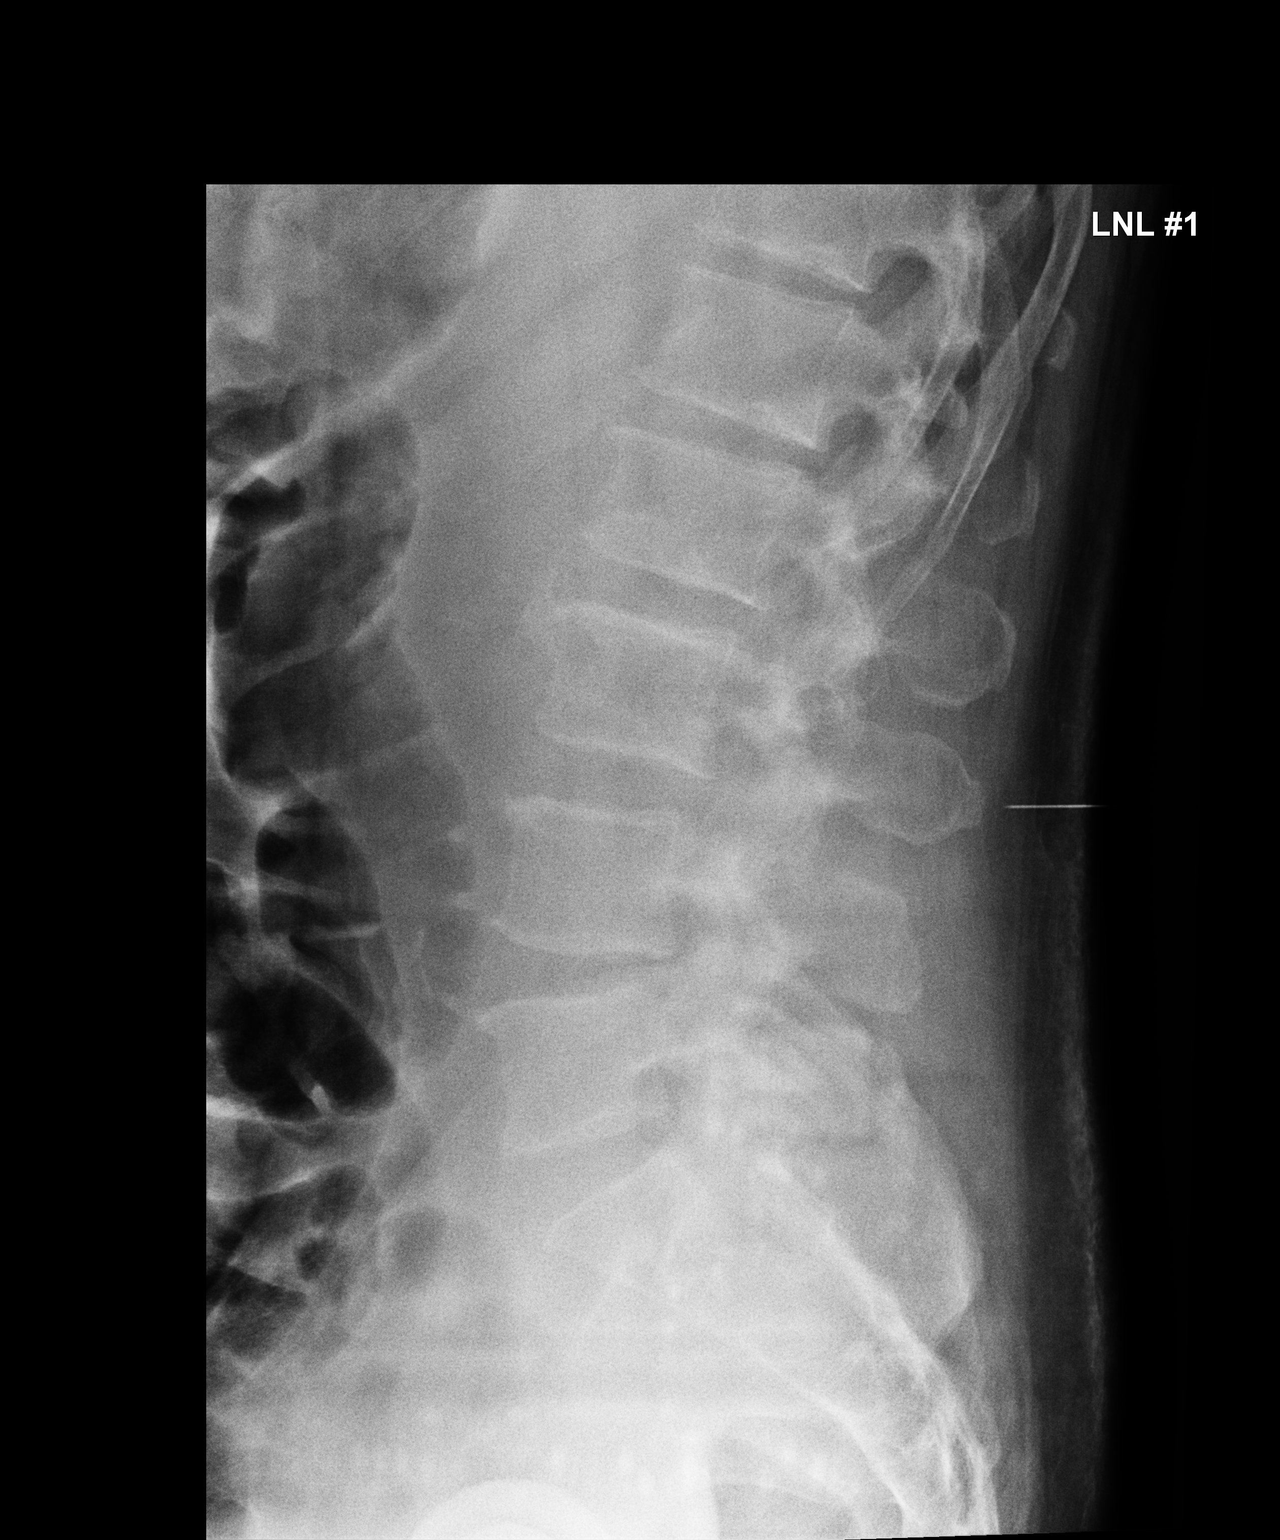

[lateral (2 of 2)]
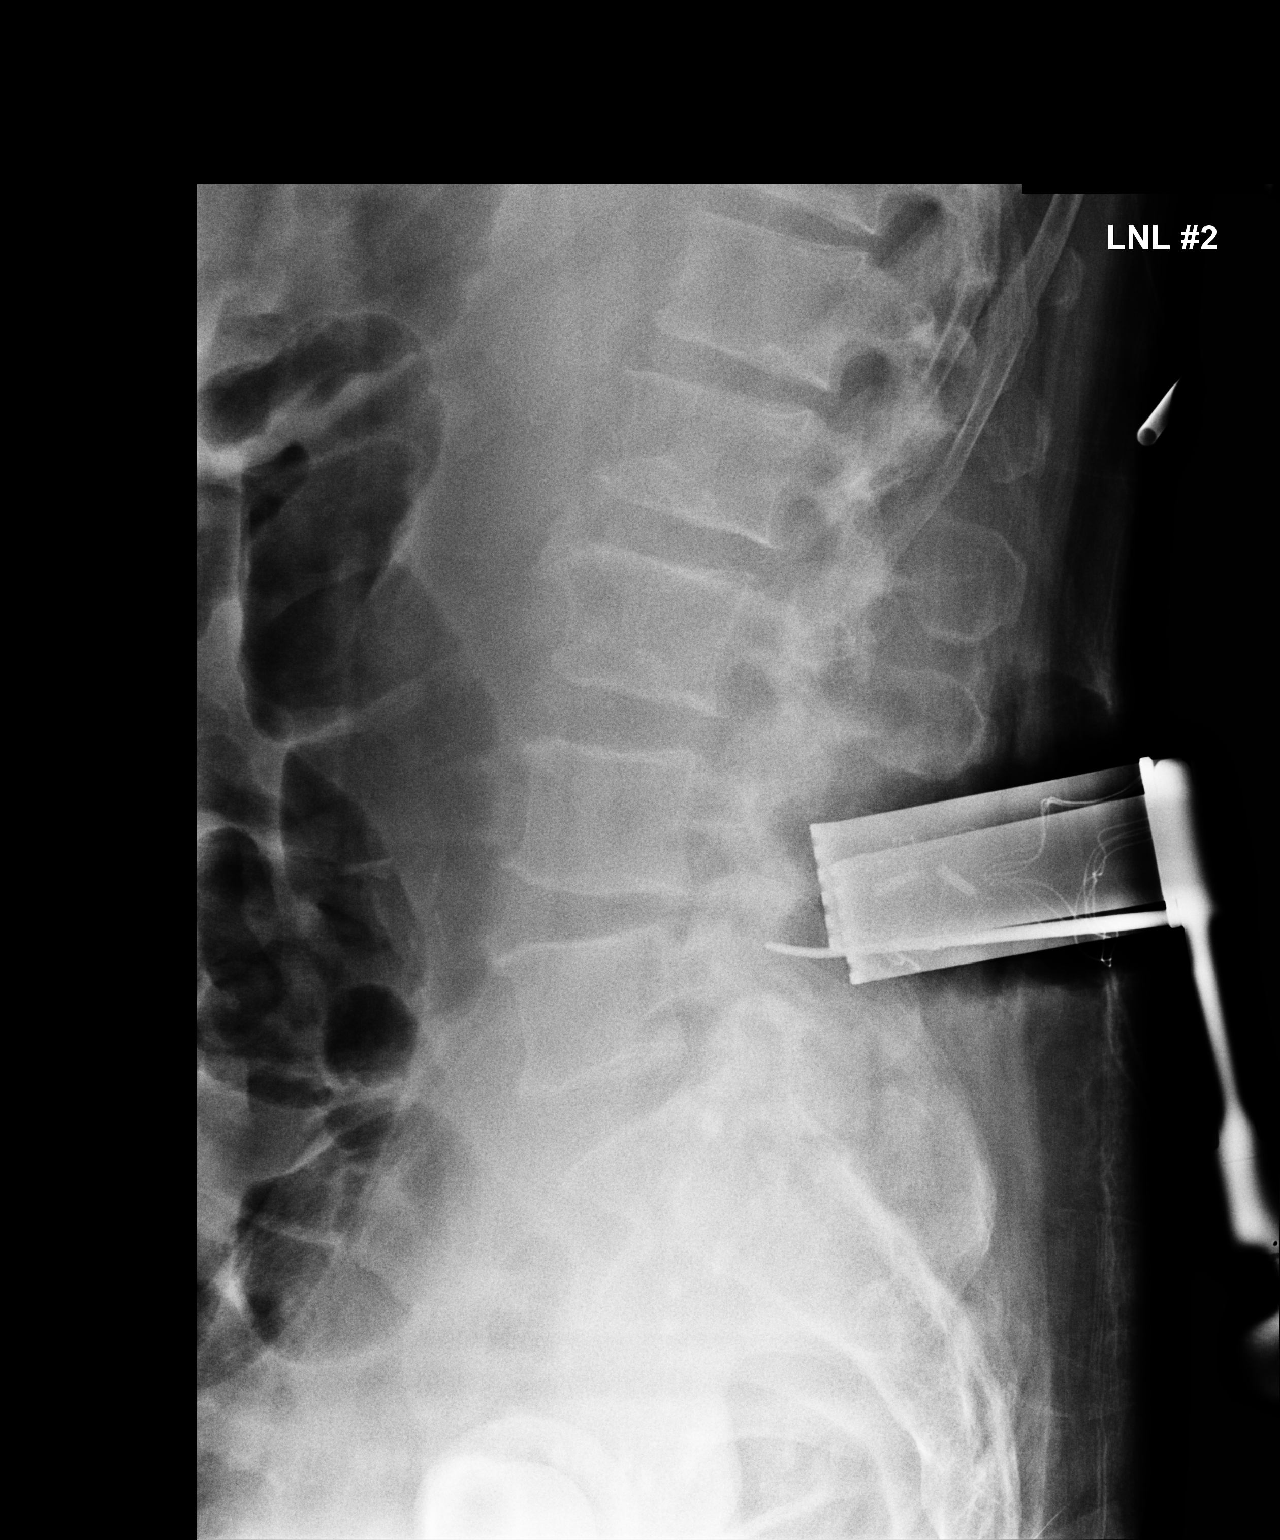

[2 of 2 positions shown; findings below may reference images not displayed]

FINDINGS: Two lateral portable cross-table views of the lumbar spine obtained
in the operating room. Image number 1 demonstrates surgical
instrument localizing posterior to the L3 spinous process. Image
number 2 demonstrates surgical instrument localizing posterior to
L4-L5.
IMPRESSION: Lateral spot views obtained in the operating room localizing
posterior to L3 spinous process and L4-L5.

## 2019-07-25 MED ORDER — ONDANSETRON HCL 4 MG PO TABS: 4.0000 mg | ORAL_TABLET | Freq: Four times a day (QID) | ORAL | Status: DC | PRN

## 2019-07-25 MED ORDER — MIDAZOLAM HCL 2 MG/2ML IJ SOLN
INTRAMUSCULAR | Status: AC | PRN
  Administered 2019-07-25: 1 mg via INTRAVENOUS

## 2019-07-25 MED ORDER — CEFAZOLIN SODIUM-DEXTROSE 2-4 GM/100ML-% IV SOLN
2.0000 g | Freq: Three times a day (TID) | INTRAVENOUS | Status: DC
  Administered 2019-07-25: 2 g via INTRAVENOUS
  Filled 2019-07-25 (×2): qty 100

## 2019-07-25 MED ORDER — FENTANYL CITRATE (PF) 100 MCG/2ML IJ SOLN
INTRAMUSCULAR | Status: AC | PRN
  Administered 2019-07-25: 25 ug via INTRAVENOUS
  Administered 2019-07-25: 50 ug via INTRAVENOUS

## 2019-07-25 MED ORDER — CYCLOBENZAPRINE HCL 10 MG PO TABS
10.0000 mg | ORAL_TABLET | Freq: Three times a day (TID) | ORAL | Status: DC | PRN
  Administered 2019-07-27 – 2019-07-30 (×4): 10 mg via ORAL
  Filled 2019-07-25 (×5): qty 1

## 2019-07-25 MED ORDER — PREDNISONE 5 MG PO TABS
15.0000 mg | ORAL_TABLET | Freq: Every day | ORAL | Status: DC
  Administered 2019-07-25 – 2019-07-31 (×7): 15 mg via ORAL
  Filled 2019-07-25 (×7): qty 3

## 2019-07-25 MED ORDER — HYDROMORPHONE HCL 1 MG/ML IJ SOLN
0.5000 mg | INTRAMUSCULAR | Status: DC | PRN
  Filled 2019-07-25: qty 1

## 2019-07-25 MED ORDER — IBUPROFEN 200 MG PO TABS
800.0000 mg | ORAL_TABLET | Freq: Three times a day (TID) | ORAL | Status: DC | PRN
  Filled 2019-07-25 (×2): qty 4

## 2019-07-25 MED ORDER — LIDOCAINE HCL (CARDIAC) PF 100 MG/5ML IV SOSY
PREFILLED_SYRINGE | INTRAVENOUS | Status: DC | PRN
  Administered 2019-07-25: 60 mg via INTRATRACHEAL

## 2019-07-25 MED ORDER — SODIUM CHLORIDE 0.9 % IV SOLN
250.0000 mL | INTRAVENOUS | Status: DC
  Administered 2019-07-25 – 2019-07-26 (×2): 250 mL via INTRAVENOUS

## 2019-07-25 MED ORDER — LACTATED RINGERS IV SOLN: INTRAVENOUS | Status: DC | PRN

## 2019-07-25 MED ORDER — SODIUM CHLORIDE 0.9% FLUSH: 3.0000 mL | INTRAVENOUS | Status: DC | PRN

## 2019-07-25 MED ORDER — THROMBIN 20000 UNITS EX SOLR
CUTANEOUS | Status: DC | PRN
  Administered 2019-07-25: 20 mL via TOPICAL

## 2019-07-25 MED ORDER — PROMETHAZINE HCL 25 MG/ML IJ SOLN: 6.2500 mg | INTRAMUSCULAR | Status: DC | PRN

## 2019-07-25 MED ORDER — MIDAZOLAM HCL 2 MG/2ML IJ SOLN
INTRAMUSCULAR | Status: AC
  Filled 2019-07-25: qty 4

## 2019-07-25 MED ORDER — ALUM & MAG HYDROXIDE-SIMETH 200-200-20 MG/5ML PO SUSP: 30.0000 mL | Freq: Four times a day (QID) | ORAL | Status: DC | PRN

## 2019-07-25 MED ORDER — ACETAMINOPHEN 650 MG RE SUPP
650.0000 mg | RECTAL | Status: DC | PRN
  Administered 2019-07-26 (×2): 650 mg via RECTAL
  Filled 2019-07-25 (×2): qty 1

## 2019-07-25 MED ORDER — FENTANYL CITRATE (PF) 250 MCG/5ML IJ SOLN
INTRAMUSCULAR | Status: DC | PRN
  Administered 2019-07-25 (×3): 50 ug via INTRAVENOUS
  Administered 2019-07-25: 100 ug via INTRAVENOUS

## 2019-07-25 MED ORDER — LIDOCAINE HCL 1 % IJ SOLN
INTRAMUSCULAR | Status: AC
  Filled 2019-07-25: qty 20

## 2019-07-25 MED ORDER — CYCLOBENZAPRINE HCL 10 MG PO TABS: 5.0000 mg | ORAL_TABLET | Freq: Three times a day (TID) | ORAL | Status: DC | PRN

## 2019-07-25 MED ORDER — PANTOPRAZOLE SODIUM 40 MG IV SOLR
40.0000 mg | Freq: Every day | INTRAVENOUS | Status: DC
  Administered 2019-07-25 – 2019-07-26 (×2): 40 mg via INTRAVENOUS
  Filled 2019-07-25 (×2): qty 40

## 2019-07-25 MED ORDER — ONDANSETRON HCL 4 MG/2ML IJ SOLN: 4.0000 mg | Freq: Four times a day (QID) | INTRAMUSCULAR | Status: DC | PRN

## 2019-07-25 MED ORDER — LIDOCAINE-EPINEPHRINE 1 %-1:100000 IJ SOLN
INTRAMUSCULAR | Status: DC | PRN
  Administered 2019-07-25: 10 mL via INTRADERMAL

## 2019-07-25 MED ORDER — FENTANYL CITRATE (PF) 100 MCG/2ML IJ SOLN: 25.0000 ug | INTRAMUSCULAR | Status: DC | PRN

## 2019-07-25 MED ORDER — SODIUM CHLORIDE 0.9 % IV SOLN
INTRAVENOUS | Status: AC | PRN
  Administered 2019-07-25: 10 mL/h via INTRAVENOUS

## 2019-07-25 MED ORDER — SODIUM CHLORIDE 0.9 % IV SOLN
INTRAVENOUS | Status: DC | PRN
  Administered 2019-07-25: 500 mL

## 2019-07-25 MED ORDER — SUGAMMADEX SODIUM 200 MG/2ML IV SOLN
INTRAVENOUS | Status: DC | PRN
  Administered 2019-07-25: 200 mg via INTRAVENOUS

## 2019-07-25 MED ORDER — MENTHOL 3 MG MT LOZG: 1.0000 | LOZENGE | OROMUCOSAL | Status: DC | PRN

## 2019-07-25 MED ORDER — THROMBIN 5000 UNITS EX SOLR
OROMUCOSAL | Status: DC | PRN
  Administered 2019-07-25: 5 mL via TOPICAL

## 2019-07-25 MED ORDER — OXYCODONE HCL 5 MG PO TABS
10.0000 mg | ORAL_TABLET | ORAL | Status: DC | PRN
  Administered 2019-07-27 – 2019-07-31 (×8): 10 mg via ORAL
  Filled 2019-07-25 (×8): qty 2

## 2019-07-25 MED ORDER — ACETAMINOPHEN 325 MG PO TABS
650.0000 mg | ORAL_TABLET | ORAL | Status: DC | PRN
  Administered 2019-07-25 – 2019-07-30 (×3): 650 mg via ORAL
  Filled 2019-07-25 (×6): qty 2

## 2019-07-25 MED ORDER — VANCOMYCIN HCL 1250 MG/250ML IV SOLN
1250.0000 mg | Freq: Two times a day (BID) | INTRAVENOUS | Status: DC
  Administered 2019-07-25: 1250 mg via INTRAVENOUS
  Filled 2019-07-25: qty 250

## 2019-07-25 MED ORDER — ONDANSETRON HCL 4 MG/2ML IJ SOLN
INTRAMUSCULAR | Status: DC | PRN
  Administered 2019-07-25: 4 mg via INTRAVENOUS

## 2019-07-25 MED ORDER — PHENOL 1.4 % MT LIQD: 1.0000 | OROMUCOSAL | Status: DC | PRN

## 2019-07-25 MED ORDER — SODIUM CHLORIDE 0.9% FLUSH
3.0000 mL | Freq: Two times a day (BID) | INTRAVENOUS | Status: DC
  Administered 2019-07-25 – 2019-07-31 (×7): 3 mL via INTRAVENOUS

## 2019-07-25 MED ORDER — ROCURONIUM BROMIDE 100 MG/10ML IV SOLN
INTRAVENOUS | Status: DC | PRN
  Administered 2019-07-25: 50 mg via INTRAVENOUS
  Administered 2019-07-25: 10 mg via INTRAVENOUS

## 2019-07-25 MED ORDER — SODIUM CHLORIDE 0.9% FLUSH: 10.0000 mL | INTRAVENOUS | Status: DC | PRN

## 2019-07-25 MED ORDER — PROPOFOL 10 MG/ML IV BOLUS
INTRAVENOUS | Status: DC | PRN
  Administered 2019-07-25: 200 ug via INTRAVENOUS

## 2019-07-25 MED ORDER — 0.9 % SODIUM CHLORIDE (POUR BTL) OPTIME
TOPICAL | Status: DC | PRN
  Administered 2019-07-25: 1000 mL

## 2019-07-25 MED ORDER — VANCOMYCIN HCL 1250 MG/250ML IV SOLN
1250.0000 mg | Freq: Two times a day (BID) | INTRAVENOUS | Status: DC
  Filled 2019-07-25: qty 250

## 2019-07-25 MED ORDER — FENTANYL CITRATE (PF) 100 MCG/2ML IJ SOLN
INTRAMUSCULAR | Status: AC
  Filled 2019-07-25: qty 4

## 2019-07-25 MED ORDER — MIDAZOLAM HCL 5 MG/5ML IJ SOLN
INTRAMUSCULAR | Status: DC | PRN
  Administered 2019-07-25: 2 mg via INTRAVENOUS

## 2019-07-25 MED ORDER — CEFAZOLIN SODIUM-DEXTROSE 2-4 GM/100ML-% IV SOLN
2.0000 g | Freq: Three times a day (TID) | INTRAVENOUS | Status: DC
  Administered 2019-07-25 – 2019-07-26 (×3): 2 g via INTRAVENOUS
  Filled 2019-07-25 (×4): qty 100

## 2019-07-25 MED ORDER — SODIUM CHLORIDE 0.9% FLUSH
10.0000 mL | Freq: Two times a day (BID) | INTRAVENOUS | Status: DC
  Administered 2019-07-25 – 2019-07-31 (×8): 10 mL

## 2019-07-25 NOTE — Progress Notes (Signed)
PHARMACY - PHYSICIAN COMMUNICATION CRITICAL VALUE ALERT - BLOOD CULTURE IDENTIFICATION (BCID)  [name] is an 53 y.o. male who presented to [facility] on 07/24/2019 with a chief complaint of leg pain, found to have epidural abscess and was taken to the operating room this morning by Dr. Saintclair Halsted for evacuation of the epidural abscess   Assessment:  MSSA bacteremia in blood cultures in a patient with epidural abscess  Name of physician (or Provider) Contacted: Dr. Saintclair Halsted (contacted office).  Also notified Dr. Tommy Medal of infectious diseases  Current antibiotics: Vancomycin + cefazolin  Changes to prescribed antibiotics recommended: Can narrow to cefazolin monotherapy.     Results for orders placed or performed during the hospital encounter of 07/24/19  Blood Culture ID Panel (Reflexed) (Collected: 07/24/2019  3:10 PM)  Result Value Ref Range   Enterococcus species NOT DETECTED NOT DETECTED   Listeria monocytogenes NOT DETECTED NOT DETECTED   Staphylococcus species DETECTED (A) NOT DETECTED   Staphylococcus aureus (BCID) DETECTED (A) NOT DETECTED   Methicillin resistance NOT DETECTED NOT DETECTED   Streptococcus species NOT DETECTED NOT DETECTED   Streptococcus agalactiae NOT DETECTED NOT DETECTED   Streptococcus pneumoniae NOT DETECTED NOT DETECTED   Streptococcus pyogenes NOT DETECTED NOT DETECTED   Acinetobacter baumannii NOT DETECTED NOT DETECTED   Enterobacteriaceae species NOT DETECTED NOT DETECTED   Enterobacter cloacae complex NOT DETECTED NOT DETECTED   Escherichia coli NOT DETECTED NOT DETECTED   Klebsiella oxytoca NOT DETECTED NOT DETECTED   Klebsiella pneumoniae NOT DETECTED NOT DETECTED   Proteus species NOT DETECTED NOT DETECTED   Serratia marcescens NOT DETECTED NOT DETECTED   Haemophilus influenzae NOT DETECTED NOT DETECTED   Neisseria meningitidis NOT DETECTED NOT DETECTED   Pseudomonas aeruginosa NOT DETECTED NOT DETECTED   Candida albicans NOT DETECTED NOT  DETECTED   Candida glabrata NOT DETECTED NOT DETECTED   Candida krusei NOT DETECTED NOT DETECTED   Candida parapsilosis NOT DETECTED NOT DETECTED   Candida tropicalis NOT DETECTED NOT DETECTED    Candie Mile 07/25/2019  9:14 AM

## 2019-07-25 NOTE — H&P (Signed)
[name] is an 53 y.o. male.   Chief Complaint: Back (right leg pain HPI: 53 year old gentleman who is had a month of progressive weakness in his lower extremities and back pain and 24 hours of progressive weakness to the point of a complete left foot drop and becoming nonambulatory.  Denies any significant past medical history reports also urinary retention 24 hours prior to presentation.  Past Medical History:  Diagnosis Date  . History of chicken pox   . Medical history non-contributory     Past Surgical History:  Procedure Laterality Date  . CARPAL TUNNEL RELEASE Bilateral 2009  . COLONOSCOPY W/ POLYPECTOMY    . JOINT REPLACEMENT    . LACERATION REPAIR Right ~ 2012   leg  . TOTAL HIP ARTHROPLASTY Right 01/11/2013   Procedure: TOTAL HIP ARTHROPLASTY;  Surgeon: Garald Balding, MD;  Location: [facility];  Service: Orthopedics;  Laterality: Right;  . TOTAL HIP ARTHROPLASTY Left 10/12/2016   Procedure: TOTAL HIP ARTHROPLASTY;  Surgeon: Garald Balding, MD;  Location: [facility];  Service: Orthopedics;  Laterality: Left;    Family History  Problem Relation Age of Onset  . Cancer Father        Hodgkin's disease  . COPD Mother   . Heart attack Maternal Grandfather 80  . Diabetes Neg Hx   . Stroke Neg Hx   . Hypertension Neg Hx   . Hyperlipidemia Neg Hx    Social History:  reports that he quit smoking about 20 years ago. His smoking use included cigarettes. He has a 14.00 pack-year smoking history. He has never used smokeless tobacco. He reports current alcohol use of about 24.0 standard drinks of alcohol per week. He reports that he does not use drugs.  Allergies:  Allergies  Allergen Reactions  . Bee Venom Anaphylaxis  . Hydrocodone Nausea And Vomiting    (Not in a hospital admission)   Results for orders placed or performed during the hospital encounter of 07/24/19 (from the past 48 hour(s))  Urinalysis, Routine w reflex microscopic     Status: Abnormal   Collection Time:  07/24/19  2:56 PM  Result Value Ref Range   Color, Urine YELLOW YELLOW   APPearance HAZY (A) CLEAR   Specific Gravity, Urine 1.017 1.005 - 1.030   pH 6.0 5.0 - 8.0   Glucose, UA NEGATIVE NEGATIVE mg/dL   Hgb urine dipstick MODERATE (A) NEGATIVE   Bilirubin Urine NEGATIVE NEGATIVE   Ketones, ur NEGATIVE NEGATIVE mg/dL   Protein, ur NEGATIVE NEGATIVE mg/dL   Nitrite POSITIVE (A) NEGATIVE   Leukocytes,Ua TRACE (A) NEGATIVE   RBC / HPF 0-5 0 - 5 RBC/hpf   WBC, UA 11-20 0 - 5 WBC/hpf   Bacteria, UA FEW (A) NONE SEEN   Mucus PRESENT    Amorphous Crystal PRESENT     Comment: Performed at [facility], [place] [address], [place], [place] [address]  Urine rapid drug screen (hosp performed)     Status: None   Collection Time: 07/24/19  2:57 PM  Result Value Ref Range   Opiates NONE DETECTED NONE DETECTED   Cocaine NONE DETECTED NONE DETECTED   Benzodiazepines NONE DETECTED NONE DETECTED   Amphetamines NONE DETECTED NONE DETECTED   Tetrahydrocannabinol NONE DETECTED NONE DETECTED   Barbiturates NONE DETECTED NONE DETECTED    Comment: (NOTE) DRUG SCREEN FOR MEDICAL PURPOSES ONLY.  IF CONFIRMATION IS NEEDED FOR ANY PURPOSE, NOTIFY LAB WITHIN 5 DAYS. LOWEST DETECTABLE LIMITS FOR URINE DRUG SCREEN  Drug Class                     Cutoff (ng/mL) Amphetamine and metabolites    1000 Barbiturate and metabolites    200 Benzodiazepine                 [id number] Tricyclics and metabolites     300 Opiates and metabolites        300 Cocaine and metabolites        300 THC                            50 Performed at [facility], [place] [address], [place], [place] [address]   Comprehensive metabolic panel     Status: Abnormal   Collection Time: 07/24/19  3:10 PM  Result Value Ref Range   Sodium 128 (L) 135 - 145 mmol/L   Potassium 3.4 (L) 3.5 - 5.1 mmol/L   Chloride 90 (L) 98 - 111 mmol/L   CO2 25 22 - 32 mmol/L   Glucose, Bld 105 (H) 70 - 99 mg/dL   BUN 20 6 - 20  mg/dL   Creatinine, Ser 0.75 0.61 - 1.24 mg/dL   Calcium 9.1 8.9 - 10.3 mg/dL   Total Protein 8.6 (H) 6.5 - 8.1 g/dL   Albumin 2.7 (L) 3.5 - 5.0 g/dL   AST 34 15 - 41 U/L   ALT 49 (H) 0 - 44 U/L   Alkaline Phosphatase 122 38 - 126 U/L   Total Bilirubin 1.2 0.3 - 1.2 mg/dL   GFR calc non Af Amer >60 >60 mL/min   GFR calc Af Amer >60 >60 mL/min   Anion gap 13 5 - 15    Comment: Performed at [facility], [place] [address], [place], [place] [address]  CBC with Differential     Status: Abnormal   Collection Time: 07/24/19  3:10 PM  Result Value Ref Range   WBC 19.9 (H) 4.0 - 10.5 K/uL   RBC 4.13 (L) 4.22 - 5.81 MIL/uL   Hemoglobin 13.6 13.0 - 17.0 g/dL   HCT 40.4 39.0 - 52.0 %   MCV 97.8 80.0 - 100.0 fL   MCH 32.9 26.0 - 34.0 pg   MCHC 33.7 30.0 - 36.0 g/dL   RDW 13.3 11.5 - 15.5 %   Platelets 407 (H) 150 - 400 K/uL   nRBC 0.0 0.0 - 0.2 %   Neutrophils Relative % 91 %   Neutro Abs 17.9 (H) 1.7 - 7.7 K/uL   Lymphocytes Relative 3 %   Lymphs Abs 0.7 0.7 - 4.0 K/uL   Monocytes Relative 5 %   Monocytes Absolute 1.0 0.1 - 1.0 K/uL   Eosinophils Relative 0 %   Eosinophils Absolute 0.1 0.0 - 0.5 K/uL   Basophils Relative 0 %   Basophils Absolute 0.1 0.0 - 0.1 K/uL   Immature Granulocytes 1 %   Abs Immature Granulocytes 0.20 (H) 0.00 - 0.07 K/uL    Comment: Performed at [facility], [place] [address] [place], [place] [address]  Sedimentation rate     Status: Abnormal   Collection Time: 07/24/19  3:10 PM  Result Value Ref Range   Sed Rate 114 (H) 0 - 16 mm/hr    Comment: Performed at [facility], [place] [address] [place], [place] [address]  HIV Antibody (routine testing w rflx)     Status: None  Collection Time: 07/24/19  3:10 PM  Result Value Ref Range   HIV Screen 4th Generation wRfx NON REACTIVE NON REACTIVE    Comment: Performed at [facility], [address] [place], [place] [address]  APTT     Status: None    Collection Time: 07/24/19  5:34 PM  Result Value Ref Range   aPTT 26 24 - 36 seconds    Comment: Performed at [facility], [place] [address], [place], [place] [address]  Protime-INR     Status: Abnormal   Collection Time: 07/24/19  5:34 PM  Result Value Ref Range   Prothrombin Time 17.6 (H) 11.4 - 15.2 seconds   INR 1.5 (H) 0.8 - 1.2    Comment: (NOTE) INR goal varies based on device and disease states. Performed at [facility], [place] [address] [place], [place] [address]   Lactic acid, plasma     Status: None   Collection Time: 07/24/19  5:54 PM  Result Value Ref Range   Lactic Acid, Venous 1.8 0.5 - 1.9 mmol/L    Comment: Performed at [facility], [place] [address] [place], [place] [address]  Ethanol     Status: None   Collection Time: 07/24/19  5:54 PM  Result Value Ref Range   Alcohol, Ethyl (B) <10 <10 mg/dL    Comment: (NOTE) Lowest detectable limit for serum alcohol is 10 mg/dL. For medical purposes only. Performed at [facility], [place] [address], [place], [place] [address]   Lactic acid, plasma     Status: None   Collection Time: 07/24/19  8:34 PM  Result Value Ref Range   Lactic Acid, Venous 1.0 0.5 - 1.9 mmol/L    Comment: Performed at [facility], [place] [address], [place], [place] [address]  Respiratory Panel by RT PCR (Flu A&B, Covid) - Nasopharyngeal Swab     Status: None   Collection Time: 07/24/19  8:34 PM   Specimen: Nasopharyngeal Swab  Result Value Ref Range   SARS Coronavirus 2 by RT PCR NEGATIVE NEGATIVE    Comment: (NOTE) SARS-CoV-2 target nucleic acids are NOT DETECTED. The SARS-CoV-2 RNA is generally detectable in upper respiratoy specimens during the acute phase of infection. The lowest concentration of SARS-CoV-2 viral copies this assay can detect is 131 copies/mL. A negative result does not preclude SARS-Cov-2 infection and should not be used as the sole  basis for treatment or other patient management decisions. A negative result may occur with  improper specimen collection/handling, submission of specimen other than nasopharyngeal swab, presence of viral mutation(s) within the areas targeted by this assay, and inadequate number of viral copies (<131 copies/mL). A negative result must be combined with clinical observations, patient history, and epidemiological information. The expected result is Negative. Fact Sheet for Patients:  PinkCheek.be Fact Sheet for Healthcare Providers:  GravelBags.it This test is not yet ap proved or cleared by the [place] FDA and  has been authorized for detection and/or diagnosis of SARS-CoV-2 by FDA under an Emergency Use Authorization (EUA). This EUA will remain  in effect (meaning this test can be used) for the duration of the COVID-19 declaration under Section 564(b)(1) of the Act, 21 U.S.C. section 360bbb-3(b)(1), unless the authorization is terminated or revoked sooner.    Influenza A by PCR NEGATIVE NEGATIVE   Influenza B by PCR NEGATIVE NEGATIVE    Comment: (NOTE) The Xpert Xpress SARS-CoV-2/FLU/RSV assay is intended as an aid in  the diagnosis of influenza from Nasopharyngeal swab specimens and  should not be used as a sole basis for treatment. Nasal washings and  aspirates are unacceptable for Xpert Xpress SARS-CoV-2/FLU/RSV  testing. Fact Sheet for Patients: PinkCheek.be Fact Sheet for Healthcare Providers: GravelBags.it This test is not yet approved or cleared by the [place] FDA and  has been authorized for detection and/or diagnosis of SARS-CoV-2 by  FDA under an Emergency Use Authorization (EUA). This EUA will remain  in effect (meaning this test can be used) for the duration of the  Covid-19 declaration under Section 564(b)(1) of the Act, 21  U.S.C. section  360bbb-3(b)(1), unless the authorization is  terminated or revoked. Performed at [facility], [place] [address] [place], [place] [address]    DG Chest Port 1 View  Result Date: 07/24/2019 CLINICAL DATA:  Sepsis. EXAM: PORTABLE CHEST 1 VIEW COMPARISON:  Chest x-ray dated September 29, 2016. FINDINGS: The heart size and mediastinal contours are within normal limits. Both lungs are clear. The visualized skeletal structures are unremarkable. IMPRESSION: No active disease. Electronically Signed   By: Titus Dubin M.D.   On: 07/24/2019 18:10    Review of Systems  Musculoskeletal: Positive for back pain.  Neurological: Positive for weakness and numbness.    Blood pressure 139/82, pulse 84, temperature (!) 100.6 F (38.1 C), temperature source Rectal, resp. rate (!) 22, height 6\' 1"  (1.854 m), weight 106.6 kg, SpO2 97 %. Physical Exam  Constitutional: He is oriented to person, place, and time. He appears well-developed and well-nourished.  HENT:  Head: Normocephalic and atraumatic.  Eyes: Pupils are equal, round, and reactive to light.  Cardiovascular: Normal rate.  Respiratory: Effort normal.  GI: Soft. Bowel sounds are normal.  Musculoskeletal:        General: Normal range of motion.     Cervical back: Normal range of motion.  Neurological: He is alert and oriented to person, place, and time. He has normal strength. GCS eye subscore is 4. GCS verbal subscore is 5. GCS motor subscore is 6.  Patient is awake and alert lower extremity strength: Left leg iliopsoas 2 out of 5 quadriceps 3 out of 5 complete foot drop 1 2 out of 5 dorsiflexion plantar flexion 3 out of 5.  Right leg slightly stronger iliopsoas 3 out of 5 quadriceps 3 out of 5 dorsiflexion plantar flexion 5 out of 5  Skin: Skin is warm and dry.     Assessment/Plan 53 year old gentleman with back pain lower extremity weakness MRI scan of lumbar spine showing sizable epidural abscess L3-4 L4-5 but also extensive  amount of abscess in the psoas muscle itself at both levels.  I have extensively gone over the risks and benefits of a decompressive laminectomy at L3-4 and L4-5 to decompress the thecal sac and hopefully facilitate some return of function with his legs fall and with his bladder.  However I do think that the iliopsoas abscess is a large component of his lower extremity weakness whereas most of his lower extremity weakness is greater proximally than distally although he does have a left foot drop.  He understands all this and agrees to proceed forward we have gone over perioperative course expectations of outcome and alternatives of surgery.  Chatham Howington P, MD 07/25/2019, 12:44 AM

## 2019-07-25 NOTE — Plan of Care (Signed)
  Problem: Education: Goal: Knowledge of General Education information will improve Description: Including pain rating scale, medication(s)/side effects and non-pharmacologic comfort measures Outcome: Progressing   Problem: Health Behavior/Discharge Planning: Goal: Ability to manage health-related needs will improve Outcome: Progressing   Problem: Clinical Measurements: Goal: Ability to maintain clinical measurements within normal limits will improve Outcome: Progressing   Problem: Clinical Measurements: Goal: Will remain free from infection Outcome: Progressing   Problem: Clinical Measurements: Goal: Respiratory complications will improve Outcome: Progressing   Problem: Clinical Measurements: Goal: Cardiovascular complication will be avoided Outcome: Progressing   

## 2019-07-25 NOTE — Transfer of Care (Signed)
Immediate Anesthesia Transfer of Care Note  Patient: [name]  Procedure(s) Performed: Decompressive Lumbar Laminectomy Lumbar three-four Lumbar four-five for Epidural Abscess (N/A Back)  Patient Location: PACU  Anesthesia Type:General  Level of Consciousness: drowsy  Airway & Oxygen Therapy: Patient Spontanous Breathing and Patient connected to face mask oxygen  Post-op Assessment: Report given to RN and Post -op Vital signs reviewed and stable  Post vital signs: Reviewed and stable  Last Vitals:  Vitals Value Taken Time  BP    Temp    Pulse 104 07/25/19 0249  Resp 21 07/25/19 0249  SpO2 97 % 07/25/19 0249  Vitals shown include unvalidated device data.  Last Pain:  Vitals:   07/24/19 2159  TempSrc:   PainSc: 0-No pain         Complications: No apparent anesthesia complications

## 2019-07-25 NOTE — Consult Note (Signed)
Chief Complaint: Patient was seen in consultation today for lumbar right psoas muscle abscess/aspiration.  Referring Physician(s): Kary Kos  Supervising Physician: Daryll Brod  Patient Status: [name] - In-pt  History of Present Illness: [name] is a 53 y.o. male with an uremarkable past medical history who presented to [facility] ED 07/24/2019 with complaint of back pain and progressive lower extremity weakness x 1 month. In ED, MR thoracic/lumbar spine revealed acute osteomyelitis of L3-L4 with associated epidural abscess from L3-L4 to L4-L5 along with associated paraspinal soft tissue infection/bilateral psoas muscle abscesses. Neurosurgery was consulted who recommended transfer and admission to [facility] for further management. He was taken to OR for decompressive lumbar laminectomies L3-4 L4-5 (for evacuation of epidural abscess) with partial medial facetectomies and foraminotomies of the L3-L4 and L5 nerve roots 07/25/2019 by Dr. Saintclair Halsted.  MR thoracic/lumbar spine 07/24/2019: 1. Findings consistent with acute osteomyelitis centered about the L3-4 interspace as above. Associated epidural abscess extending from L3-4 through L4-5 with resultant severe diffuse spinal stenosis. 2. Associated paraspinous soft tissue infection with bilateral psoas muscle abscesses, right greater than left. 3. No other evidence for acute or distant infection elsewhere within the thoracic or lumbar spine. 4. Underlying mild to moderate multilevel degenerative spondylosis as above. No other high-grade stenosis or impingement.  IR consulted by Dr. Saintclair Halsted for possible image-guided lumbar right psoas muscle abscess aspiration. Patient laying in bed resting comfortably. He opens eyes and responds to voice. He knows who he is, but unaware of time or place (states that its November and we are "at a hotel in [facility]"). History difficult to obtain due to AMS.   Past Medical History:  Diagnosis Date  . History of chicken  pox   . Medical history non-contributory     Past Surgical History:  Procedure Laterality Date  . CARPAL TUNNEL RELEASE Bilateral 2009  . COLONOSCOPY W/ POLYPECTOMY    . JOINT REPLACEMENT    . LACERATION REPAIR Right ~ 2012   leg  . TOTAL HIP ARTHROPLASTY Right 01/11/2013   Procedure: TOTAL HIP ARTHROPLASTY;  Surgeon: Garald Balding, MD;  Location: [place];  Service: Orthopedics;  Laterality: Right;  . TOTAL HIP ARTHROPLASTY Left 10/12/2016   Procedure: TOTAL HIP ARTHROPLASTY;  Surgeon: Garald Balding, MD;  Location: [place];  Service: Orthopedics;  Laterality: Left;    Allergies: Bee venom and Hydrocodone  Medications: Prior to Admission medications   Medication Sig Start Date End Date Taking? Authorizing Provider  cyclobenzaprine (FLEXERIL) 5 MG tablet Take 1 tablet (5 mg total) by mouth 3 (three) times daily as needed for muscle spasms. 07/18/19  Yes Lesleigh Noe, MD  ibuprofen (ADVIL) 200 MG tablet Take 800 mg by mouth every 8 (eight) hours as needed for moderate pain.   Yes [provider]  predniSONE (DELTASONE) 5 MG tablet Take 3 tablets (15 mg total) by mouth daily with breakfast. 07/24/19   Lesleigh Noe, MD     Family History  Problem Relation Age of Onset  . Cancer Father        Hodgkin's disease  . COPD Mother   . Heart attack Maternal Grandfather 80  . Diabetes Neg Hx   . Stroke Neg Hx   . Hypertension Neg Hx   . Hyperlipidemia Neg Hx     Social History   Socioeconomic History  . Marital status: Married    Spouse name: [name]  . Number of children: 2  . Years of education: High school  .  Highest education level: Not on file  Occupational History  . Occupation: burial Occupational hygienist: [employer]  Tobacco Use  . Smoking status: Former Smoker    Packs/day: 1.00    Years: 14.00    Pack years: 14.00    Types: Cigarettes    Quit date: 07/27/1999    Years since quitting: 20.0  . Smokeless tobacco: Never Used    Substance and Sexual Activity  . Alcohol use: Yes    Alcohol/week: 24.0 standard drinks    Types: 24 Cans of beer per week    Comment: 4-5 beers per day, case of beer per week  . Drug use: No  . Sexual activity: Yes    Birth control/protection: Post-menopausal  Other Topics Concern  . Not on file  Social History Narrative   02/05/19   From: the area   Living: Living with wife [name]   Work: Dealer      Family: 2 children - [name] and [name] - liver nearby, no grandkids      Enjoys: float down the river, race track, camping      Exercise: just keeping busy at work   Diet: mostly meat and potatoes      Safety   Seat belts: Yes    Guns: Yes  and secure   Safe in relationships: Yes    Social Determinants of Health   Financial Resource Strain: [place]   . Difficulty of Paying Living Expenses: Not hard at all  Food Insecurity:   . Worried About Charity fundraiser in the Last Year: Not on file  . Ran Out of Food in the Last Year: Not on file  Transportation Needs:   . Lack of Transportation (Medical): Not on file  . Lack of Transportation (Non-Medical): Not on file  Physical Activity:   . Days of Exercise per Week: Not on file  . Minutes of Exercise per Session: Not on file  Stress:   . Feeling of Stress : Not on file  Social Connections:   . Frequency of Communication with Friends and Family: Not on file  . Frequency of Social Gatherings with Friends and Family: Not on file  . Attends Religious Services: Not on file  . Active Member of Clubs or Organizations: Not on file  . Attends Archivist Meetings: Not on file  . Marital Status: Not on file     Review of Systems: A 12 point ROS discussed and pertinent positives are indicated in the HPI above.  All other systems are negative.  Review of Systems  Unable to perform ROS: Mental status change    Vital Signs: BP 133/78 (BP Location: Right Arm)   Pulse (!) 109   Temp 99.7 F (37.6 C) (Oral)    Resp 19   Ht 6\' 1"  (1.854 m)   Wt 235 lb (106.6 kg)   SpO2 98%   BMI 31.00 kg/m   Physical Exam Vitals and nursing note reviewed.  Constitutional:      General: He is not in acute distress. Cardiovascular:     Rate and Rhythm: Normal rate and regular rhythm.     Heart sounds: Normal heart sounds. No murmur.  Pulmonary:     Effort: Pulmonary effort is normal. No respiratory distress.     Breath sounds: Normal breath sounds. No wheezing.  Skin:    General: Skin is warm and dry.  Neurological:     Comments: Oriented  to person only.      MD Evaluation Airway: WNL Heart: WNL Abdomen: WNL Chest/ Lungs: WNL ASA  Classification: 2 Mallampati/Airway Score: Two   Imaging: DG Lumbar Spine 2-3 Views  Result Date: 07/25/2019 CLINICAL DATA:  Decompression laminectomy L3-L4 for epidural abscess. EXAM: LUMBAR SPINE - 2-3 VIEW COMPARISON:  Lumbar spine MRI yesterday. FINDINGS: Two lateral portable cross-table views of the lumbar spine obtained in the operating room. Image number 1 demonstrates surgical instrument localizing posterior to the L3 spinous process. Image number 2 demonstrates surgical instrument localizing posterior to L4-L5. IMPRESSION: Lateral spot views obtained in the operating room localizing posterior to L3 spinous process and L4-L5. Electronically Signed   By: Keith Rake M.D.   On: 07/25/2019 03:11   MR THORACIC SPINE W WO CONTRAST  Result Date: 07/24/2019 CLINICAL DATA:  Initial evaluation for acute low back pain with left leg pain. EXAM: MRI THORACIC AND LUMBAR SPINE WITHOUT AND WITH CONTRAST TECHNIQUE: Multiplanar and multiecho pulse sequences of the thoracic and lumbar spine were obtained without and with intravenous contrast. CONTRAST:  75mL GADAVIST GADOBUTROL 1 MMOL/ML IV SOLN COMPARISON:  Prior radiograph from 07/12/2019. FINDINGS: MRI THORACIC SPINE FINDINGS Alignment: Vertebral bodies normally aligned with preservation of the normal thoracic kyphosis. No  listhesis. Vertebrae: Vertebral body height maintained without evidence for acute or chronic fracture. Multiple scattered chronic endplate Schmorl's nodes noted throughout the mid and lower thoracic spine. Underlying bone marrow signal intensity diffusely decreased on T1 weighted imaging, nonspecific, but most commonly related to anemia, smoking, or obesity. Few scattered benign hemangiomata noted within the T1, T6, and T12 vertebral bodies. No other discrete or worrisome osseous lesions. No other abnormal marrow edema or enhancement. No evidence for osteomyelitis discitis or septic arthritis. Cord: Signal intensity within the thoracic spinal cord is grossly within normal limits on this motion degraded exam. Normal cord caliber morphology. No abnormal enhancement. No appreciable epidural collection. Paraspinal and other soft tissues: Paraspinous soft tissues within normal limits. Minimal atelectatic changes noted within the posterior left lung. Disc levels: T1-2:  Unremarkable. T2-3: Mild right eccentric disc bulge. No canal or foraminal stenosis. T3-4: Negative interspace. Right greater than left facet hypertrophy. No stenosis. T4-5:  Negative interspace.  Mild facet hypertrophy.  No stenosis. T5-6: Diffuse disc bulge with intervertebral disc space narrowing. Mild facet hypertrophy. No significant spinal stenosis. Foramina remain patent. T6-7:  Unremarkable. T7-8: Shallow right paracentral disc protrusion indents the right ventral thecal sac. Minimal flattening of the ventral right cord without cord signal changes. No significant canal or foraminal stenosis. T8-9: Shallow central disc protrusion indents the ventral thecal sac, slightly eccentric to the right. Minimal flattening of the ventral cord without cord signal changes. No significant spinal stenosis. Foramina remain patent. T9-10: Mild disc bulge.  No stenosis. T10-11: Mild disc bulge. Prominent right-sided endplate osteophytic spurring. Right greater than  left facet hypertrophy. No stenosis. T11-12: Mild disc bulge. Moderate bilateral facet hypertrophy. No stenosis. T12-L1:  Unremarkable. MRI LUMBAR SPINE FINDINGS Segmentation: Standard. Lowest well-formed disc space labeled the L5-S1 level. Alignment: Trace retrolisthesis of L3 on L4 and L4 on L5. Alignment otherwise normal preservation of the normal lumbar lordosis. Vertebrae: Vertebral body height maintained without evidence for acute or chronic fracture. Bone marrow signal intensity diffusely decreased on T1 weighted imaging, nonspecific, but most commonly related to anemia, smoking, or obesity. Few scattered benign hemangiomata noted, most prominent of which position within the L2 and L3 vertebral bodies. No other worrisome osseous lesions. There is abnormal marrow  edema and enhancement involving the right greater than left aspect of the L4 vertebral body, concerning for acute osteomyelitis. Extension into the right L4 pedicle with possible involvement of the right L3-4 facet (series 18, image 5). Mild edema and enhancement also noted at the right aspect of the L3 inferior endplate, which could also be involved. There is preserved disc height at the intervening L3-4 interspace without overt evidence for acute discitis. Associated paraspinous edema and enhancement seen involving the adjacent psoas musculature bilaterally, extending from L2-3 inferiorly, worse on the right. Superimposed paraspinous abscesses are seen bilaterally. Collection on the right measures 3.9 x 3.2 cm (series 19, image 26). Few scattered heterogeneous but smaller collection seen involving the left psoas, largest of which seen at the level of L4-5 and measures 2.1 cm (series 19, image 25). Evidence for epidural extension of infection with epidural abscess seen extending from approximately L3-4 through L4-5. Abscess primarily involves the dorsal epidural space at the level of L3-4 (series 19, image 18). Overall, collection measures  approximately 1.0 x 0.7 x 6.3 cm in greatest dimensions (AP by transverse by craniocaudad). Secondary mass effect on the thecal sac which is compressed anteriorly at the level of L3-4 (series 19, image 18), into the left at the level of L4-5 (series 19, image 27). There is fairly diffuse severe spinal stenosis extending from L3 through L4-5. Conus medullaris: Extends to the L1 level and appears normal. Paraspinal and other soft tissues: Paraspinal infection with associated bilateral psoas muscle abscesses as above. Additional mild soft tissue edema adjacent to the right L3-4 facet, likely infected. Disc levels: L1-2: Mild circumferential disc bulge. Mild bilateral facet hypertrophy. No significant stenosis. L2-3: Minimal annular disc bulge. Mild to moderate bilateral facet hypertrophy. No significant canal or foraminal stenosis. L3-4: Findings concerning for osteomyelitis involving the L4 and to a lesser extent L3 vertebral bodies. Suspected discitis involving the L3-4 interspace, although no overt changes evident by MRI as of yet. Circumferential disc bulge. Moderate bilateral facet hypertrophy with small bilateral joint effusions. Edema enhancement about the right L3-4 facet, suspected to be infected. Dorsal epidural abscess with compression of the thecal sac anteriorly. Resultant severe spinal stenosis. Moderate right L3 foraminal narrowing. L4-5: Diffuse disc bulge with disc desiccation. Changes related osteomyelitis involving the L4 vertebral body. Moderate bilateral facet hypertrophy. Epidural abscess involving the dorsal and right aspect of the spinal canal with compression of the thecal sac to the left. Resultant severe spinal stenosis. Moderate right with mild left L4 foraminal narrowing. L5-S1: Negative interspace. Moderate to severe right worse than left facet hypertrophy. No stenosis. IMPRESSION: 1. Findings consistent with acute osteomyelitis centered about the L3-4 interspace as above. Associated  epidural abscess extending from L3-4 through L4-5 with resultant severe diffuse spinal stenosis. 2. Associated paraspinous soft tissue infection with bilateral psoas muscle abscesses, right greater than left. 3. No other evidence for acute or distant infection elsewhere within the thoracic or lumbar spine. 4. Underlying mild to moderate multilevel degenerative spondylosis as above. No other high-grade stenosis or impingement. Critical Value/emergent results were called by telephone at the time of interpretation on 07/24/2019 at 9:22 pm to providerCaroline Rau , who verbally acknowledged these results. Electronically Signed   By: Jeannine Boga M.D.   On: 07/24/2019 21:23   MR Lumbar Spine W Wo Contrast  Result Date: 07/24/2019 CLINICAL DATA:  Initial evaluation for acute low back pain with left leg pain. EXAM: MRI THORACIC AND LUMBAR SPINE WITHOUT AND WITH CONTRAST TECHNIQUE: Multiplanar and multiecho  pulse sequences of the thoracic and lumbar spine were obtained without and with intravenous contrast. CONTRAST:  96mL GADAVIST GADOBUTROL 1 MMOL/ML IV SOLN COMPARISON:  Prior radiograph from 07/12/2019. FINDINGS: MRI THORACIC SPINE FINDINGS Alignment: Vertebral bodies normally aligned with preservation of the normal thoracic kyphosis. No listhesis. Vertebrae: Vertebral body height maintained without evidence for acute or chronic fracture. Multiple scattered chronic endplate Schmorl's nodes noted throughout the mid and lower thoracic spine. Underlying bone marrow signal intensity diffusely decreased on T1 weighted imaging, nonspecific, but most commonly related to anemia, smoking, or obesity. Few scattered benign hemangiomata noted within the T1, T6, and T12 vertebral bodies. No other discrete or worrisome osseous lesions. No other abnormal marrow edema or enhancement. No evidence for osteomyelitis discitis or septic arthritis. Cord: Signal intensity within the thoracic spinal cord is grossly within normal  limits on this motion degraded exam. Normal cord caliber morphology. No abnormal enhancement. No appreciable epidural collection. Paraspinal and other soft tissues: Paraspinous soft tissues within normal limits. Minimal atelectatic changes noted within the posterior left lung. Disc levels: T1-2:  Unremarkable. T2-3: Mild right eccentric disc bulge. No canal or foraminal stenosis. T3-4: Negative interspace. Right greater than left facet hypertrophy. No stenosis. T4-5:  Negative interspace.  Mild facet hypertrophy.  No stenosis. T5-6: Diffuse disc bulge with intervertebral disc space narrowing. Mild facet hypertrophy. No significant spinal stenosis. Foramina remain patent. T6-7:  Unremarkable. T7-8: Shallow right paracentral disc protrusion indents the right ventral thecal sac. Minimal flattening of the ventral right cord without cord signal changes. No significant canal or foraminal stenosis. T8-9: Shallow central disc protrusion indents the ventral thecal sac, slightly eccentric to the right. Minimal flattening of the ventral cord without cord signal changes. No significant spinal stenosis. Foramina remain patent. T9-10: Mild disc bulge.  No stenosis. T10-11: Mild disc bulge. Prominent right-sided endplate osteophytic spurring. Right greater than left facet hypertrophy. No stenosis. T11-12: Mild disc bulge. Moderate bilateral facet hypertrophy. No stenosis. T12-L1:  Unremarkable. MRI LUMBAR SPINE FINDINGS Segmentation: Standard. Lowest well-formed disc space labeled the L5-S1 level. Alignment: Trace retrolisthesis of L3 on L4 and L4 on L5. Alignment otherwise normal preservation of the normal lumbar lordosis. Vertebrae: Vertebral body height maintained without evidence for acute or chronic fracture. Bone marrow signal intensity diffusely decreased on T1 weighted imaging, nonspecific, but most commonly related to anemia, smoking, or obesity. Few scattered benign hemangiomata noted, most prominent of which position  within the L2 and L3 vertebral bodies. No other worrisome osseous lesions. There is abnormal marrow edema and enhancement involving the right greater than left aspect of the L4 vertebral body, concerning for acute osteomyelitis. Extension into the right L4 pedicle with possible involvement of the right L3-4 facet (series 18, image 5). Mild edema and enhancement also noted at the right aspect of the L3 inferior endplate, which could also be involved. There is preserved disc height at the intervening L3-4 interspace without overt evidence for acute discitis. Associated paraspinous edema and enhancement seen involving the adjacent psoas musculature bilaterally, extending from L2-3 inferiorly, worse on the right. Superimposed paraspinous abscesses are seen bilaterally. Collection on the right measures 3.9 x 3.2 cm (series 19, image 26). Few scattered heterogeneous but smaller collection seen involving the left psoas, largest of which seen at the level of L4-5 and measures 2.1 cm (series 19, image 25). Evidence for epidural extension of infection with epidural abscess seen extending from approximately L3-4 through L4-5. Abscess primarily involves the dorsal epidural space at the level of L3-4 (  series 19, image 18). Overall, collection measures approximately 1.0 x 0.7 x 6.3 cm in greatest dimensions (AP by transverse by craniocaudad). Secondary mass effect on the thecal sac which is compressed anteriorly at the level of L3-4 (series 19, image 18), into the left at the level of L4-5 (series 19, image 27). There is fairly diffuse severe spinal stenosis extending from L3 through L4-5. Conus medullaris: Extends to the L1 level and appears normal. Paraspinal and other soft tissues: Paraspinal infection with associated bilateral psoas muscle abscesses as above. Additional mild soft tissue edema adjacent to the right L3-4 facet, likely infected. Disc levels: L1-2: Mild circumferential disc bulge. Mild bilateral facet  hypertrophy. No significant stenosis. L2-3: Minimal annular disc bulge. Mild to moderate bilateral facet hypertrophy. No significant canal or foraminal stenosis. L3-4: Findings concerning for osteomyelitis involving the L4 and to a lesser extent L3 vertebral bodies. Suspected discitis involving the L3-4 interspace, although no overt changes evident by MRI as of yet. Circumferential disc bulge. Moderate bilateral facet hypertrophy with small bilateral joint effusions. Edema enhancement about the right L3-4 facet, suspected to be infected. Dorsal epidural abscess with compression of the thecal sac anteriorly. Resultant severe spinal stenosis. Moderate right L3 foraminal narrowing. L4-5: Diffuse disc bulge with disc desiccation. Changes related osteomyelitis involving the L4 vertebral body. Moderate bilateral facet hypertrophy. Epidural abscess involving the dorsal and right aspect of the spinal canal with compression of the thecal sac to the left. Resultant severe spinal stenosis. Moderate right with mild left L4 foraminal narrowing. L5-S1: Negative interspace. Moderate to severe right worse than left facet hypertrophy. No stenosis. IMPRESSION: 1. Findings consistent with acute osteomyelitis centered about the L3-4 interspace as above. Associated epidural abscess extending from L3-4 through L4-5 with resultant severe diffuse spinal stenosis. 2. Associated paraspinous soft tissue infection with bilateral psoas muscle abscesses, right greater than left. 3. No other evidence for acute or distant infection elsewhere within the thoracic or lumbar spine. 4. Underlying mild to moderate multilevel degenerative spondylosis as above. No other high-grade stenosis or impingement. Critical Value/emergent results were called by telephone at the time of interpretation on 07/24/2019 at 9:22 pm to providerCaroline Rau , who verbally acknowledged these results. Electronically Signed   By: Jeannine Boga M.D.   On: 07/24/2019  21:23   CT Abdomen Pelvis W Contrast  Result Date: 07/19/2019 CLINICAL DATA:  Severe low back pain. Elevated C reactive protein. Leukocytosis. Elevated sedimentation rate. EXAM: CT ABDOMEN AND PELVIS WITH CONTRAST TECHNIQUE: Multidetector CT imaging of the abdomen and pelvis was performed using the standard protocol following bolus administration of intravenous contrast. CONTRAST:  180mL OMNIPAQUE IOHEXOL 300 MG/ML  SOLN COMPARISON:  CT scan of the pelvis dated 06/05/2007 FINDINGS: Lower chest: Normal. Hepatobiliary: No focal liver abnormality is seen. No gallstones, gallbladder wall thickening, or biliary dilatation. Pancreas: Unremarkable. No pancreatic ductal dilatation or surrounding inflammatory changes. Spleen: Normal in size without focal abnormality. Adrenals/Urinary Tract: The adrenal glands are normal. Kidneys are normal except for an exophytic 15 mm cyst on the lower pole of the left kidney. No hydronephrosis. Bladder is normal. Stomach/Bowel: Stomach is within normal limits. Appendix appears normal. No evidence of bowel wall thickening, distention, or inflammatory changes. Vascular/Lymphatic: Slight aortic atherosclerosis. No enlarged abdominal or pelvic lymph nodes. Reproductive: Prostate is unremarkable. Other: No abdominal wall hernia or abnormality. No abdominopelvic ascites. Musculoskeletal: No acute or significant osseous findings. Bilateral hip prostheses. IMPRESSION: Benign-appearing abdomen and pelvis. Electronically Signed   By: Lorriane Shire M.D.  On: 07/19/2019 12:50   DG Chest Port 1 View  Result Date: 07/24/2019 CLINICAL DATA:  Sepsis. EXAM: PORTABLE CHEST 1 VIEW COMPARISON:  Chest x-ray dated September 29, 2016. FINDINGS: The heart size and mediastinal contours are within normal limits. Both lungs are clear. The visualized skeletal structures are unremarkable. IMPRESSION: No active disease. Electronically Signed   By: Titus Dubin M.D.   On: 07/24/2019 18:10   XR HIP UNILAT W  OR W/O PELVIS 2-3 VIEWS LEFT  Result Date: 07/12/2019 AP pelvis demonstrates bilateral total hip replacements in good position.  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  No acute changes.  No evidence of polyethylene wear.  There is an area of increased bone density in the area of the pubis that I suspect may be related to the sacrum.  Will order for sacral and lumbar films  XR Lumbar Spine 2-3 Views  Result Date: 07/12/2019 AP and lateral lumbar spine demonstrate degenerative changes particularly at L4-5 and L5-S1 with facet sclerosis.  No listhesis or scoliosis.  No acute changes   Labs:  CBC: Recent Labs    07/18/19 1220 07/24/19 1510  WBC 17.3* 19.9*  HGB 13.6 13.6  HCT 40.3 40.4  PLT 645.0* 407*    COAGS: Recent Labs    07/24/19 1734  INR 1.5*  APTT 26    BMP: Recent Labs    07/18/19 1220 07/24/19 1510  NA 132* 128*  K 4.4 3.4*  CL 97 90*  CO2 24 25  GLUCOSE 100* 105*  BUN 21 20  CALCIUM 9.4 9.1  CREATININE 0.91 0.75  GFRNONAA  --  >60  GFRAA  --  >60    LIVER FUNCTION TESTS: Recent Labs    07/18/19 1220 07/24/19 1510  BILITOT 0.8 1.2  AST 28 34  ALT 56* 49*  ALKPHOS 120* 122  PROT 7.4 8.6*  ALBUMIN 3.5 2.7*     Assessment and Plan:  Lumbar right psoas muscle abscess. Plan for image-guided lumbar right psoas muscle abscess aspiration tentatively for today in IR. Patient is NPO. Afebrile. He does not take blood thinners. INR 1.5 07/24/2019.  Risks and benefits discussed with the patient including, but not limited to bleeding, infection, damage to adjacent structures or low yield requiring additional tests. Upon physical exam, patient oriented to self but unaware of time or location. Due to AMS, consent was obtained by patient's daughter, Darrik Montalbano, via telephone- signed and in IR control room.   Thank you for this interesting consult.  I greatly enjoyed meeting RODD POLAN and look forward to participating in  their care.  A copy of this report was sent to the requesting provider on this date.  Electronically Signed: Earley Abide, PA-C 07/25/2019, 9:50 AM   I spent a total of 40 Minutes in face to face in clinical consultation, greater than 50% of which was counseling/coordinating care for lumbar right psoas muscle abscess/aspiration.

## 2019-07-25 NOTE — Anesthesia Procedure Notes (Signed)
Procedure Name: Intubation Date/Time: 07/25/2019 1:12 AM Performed by: Clovis Cao, CRNA Pre-anesthesia Checklist: Patient identified, Emergency Drugs available, Suction available, Patient being monitored and Timeout performed Patient Re-evaluated:Patient Re-evaluated prior to induction Oxygen Delivery Method: Circle system utilized Preoxygenation: Pre-oxygenation with 100% oxygen Induction Type: IV induction Ventilation: Mask ventilation without difficulty Laryngoscope Size: Miller and 2 Grade View: Grade I Tube type: Oral Tube size: 7.5 mm Number of attempts: 1 Airway Equipment and Method: Stylet Placement Confirmation: ETT inserted through vocal cords under direct vision,  positive ETCO2 and breath sounds checked- equal and bilateral Secured at: 22 cm Tube secured with: Tape Dental Injury: Teeth and Oropharynx as per pre-operative assessment

## 2019-07-25 NOTE — Progress Notes (Signed)
Pharmacy Antibiotic Note  [name] is a 53 y.o. male admitted on 07/24/2019 with epidural abscess s/p debridement.  Pharmacy has been consulted for vancomycin dosing.  Plan: Rec'd vanc 1g at [facility] ED.   Vancomycin 1250mg  IV Q12H. Goal AUC 400-550.  Expected AUC 450.  SCr used 0.8.   Height: 6\' 1"  (185.4 cm) Weight: 235 lb (106.6 kg) IBW/kg (Calculated) : 79.9  Temp (24hrs), Avg:99.4 F (37.4 C), Min:97.8 F (36.6 C), Max:100.6 F (38.1 C)  Recent Labs  Lab 07/18/19 1220 07/24/19 1510 07/24/19 1754 07/24/19 2034  WBC 17.3* 19.9*  --   --   CREATININE 0.91 0.75  --   --   LATICACIDVEN  --   --  1.8 1.0    Estimated Creatinine Clearance: 135.3 mL/min (by C-G formula based on SCr of 0.75 mg/dL).    Allergies  Allergen Reactions  . Bee Venom Anaphylaxis  . Hydrocodone Nausea And Vomiting    Thank you for allowing pharmacy to be a part of this patient's care.  Wynona Neat, PharmD, BCPS  07/25/2019 2:55 AM

## 2019-07-25 NOTE — Evaluation (Signed)
Physical Therapy Evaluation Patient Details Name: [name] MRN: [id number] DOB: 1965/12/15 Today's Date: 07/25/2019   History of Present Illness  Pt is a 53 yo male s/p acute osteomyelitis s/p abcess of psoas muscle with AMS and weakness.  Pt in OR for decompressive lumbar laminectomies L3-4 L4-5 (for evacuation of epidural abscess) with partial medial facetectomies and foraminotomies of the L3-L4 and L5 nerve roots 07/25/2019 by Dr. Saintclair Halsted. Bilateral psoas abscesses as well, with R psoas drained by IR. PMHx:b/l THAs, obesity, alcohol abuse.  Clinical Impression   Pt presents with generalized weakness, impaired cognition at present, back pain with mobility, extreme difficulty performing mobility tasks, poor sitting balance, and decreased activity tolerance. Pt to benefit from acute PT to address deficits. Pt required total assist +2 to perform bed mobility safely with log roll technique, unable to progress to standing today due to pt weakness and confusion. PT recommending CIR to address mobility deficits and maximize return to PLOF. PT to progress mobility as tolerated, and will continue to follow acutely.      Follow Up Recommendations CIR    Equipment Recommendations  Other (comment)(defer to next venue)    Recommendations for Other Services       Precautions / Restrictions Precautions Precautions: Back;Fall Precaution Comments: Pt not mentally aware of precautions, but will review when pt more congitively aware. Utilized log roll technique in and out of bed for spinal safety. Restrictions Weight Bearing Restrictions: No      Mobility  Bed Mobility Overal bed mobility: Needs Assistance Bed Mobility: Rolling;Sidelying to Sit;Sit to Supine Rolling: Max assist Sidelying to sit: Max assist;+2 for physical assistance;+2 for safety/equipment;HOB elevated   Sit to supine: Max assist;Total assist;+2 for physical assistance;+2 for safety/equipment   General bed mobility comments:  total assist +2 for log roll technique, trunk elevation/lowering, LE management, and scooting pt up in bed upon return to supine. Pt not following commands.  Transfers Overall transfer level: Needs assistance               General transfer comment: attempt, but deferred; unable to stand with total assist +2 and when PT asked pt to scoot towards [place], pt with heavy anterior leaning followed by posterior leaning resulting in sacral sitting and sliding to EOB. PT and OT quickly assisted pt into supine for safety.  Ambulation/Gait                Stairs            Wheelchair Mobility    Modified Rankin (Stroke Patients Only)       Balance Overall balance assessment: Needs assistance Sitting-balance support: Bilateral upper extremity supported;Feet supported Sitting balance-Leahy Scale: Poor Sitting balance - Comments: Intermittent assist modA to maxA for sitting EOB as pt unable to fully focus on task at hand. Mitts on hands.                                     Pertinent Vitals/Pain Pain Assessment: Faces Faces Pain Scale: Hurts little more Pain Location: back Pain Descriptors / Indicators: Discomfort;Grimacing;Guarding Pain Intervention(s): Limited activity within patient's tolerance;Monitored during session;Repositioned    Home Living Family/patient expects to be discharged to:: Private residence Living Arrangements: Alone                    Prior Function           Comments: unable  to obtain - pt could not state. When asked if pt ambulated PTA, pt states "No, I ran"     Hand Dominance   Dominant Hand: Right    Extremity/Trunk Assessment   Upper Extremity Assessment Upper Extremity Assessment: Defer to OT evaluation    Lower Extremity Assessment Lower Extremity Assessment: Generalized weakness;Difficult to assess due to impaired cognition    Cervical / Trunk Assessment Cervical / Trunk Assessment: Other  exceptions Cervical / Trunk Exceptions: s/p lumbar sxs  Communication   Communication: Receptive difficulties;Expressive difficulties  Cognition Arousal/Alertness: Awake/alert Behavior During Therapy: Impulsive Overall Cognitive Status: Impaired/Different from baseline Area of Impairment: Orientation;Attention;Memory;Following commands;Safety/judgement;Awareness;Problem solving                 Orientation Level: Disoriented to;Place;Time;Situation Current Attention Level: Selective Memory: Decreased recall of precautions;Decreased short-term memory Following Commands: Follows one step commands inconsistently Safety/Judgement: Decreased awareness of safety;Decreased awareness of deficits Awareness: Intellectual Problem Solving: Slow processing;Decreased initiation;Difficulty sequencing;Requires verbal cues General Comments: Pt shouting out silly songs that were not pertinent to OT/PT session, with occasional cursing. Pt unable to orient self to hospital setting. Pt insistent upon being on a boat, being home, and speaking to individuals that he knows PTA as if they are in the room      General Comments      Exercises     Assessment/Plan    PT Assessment Patient needs continued PT services  PT Problem List Decreased strength;Decreased mobility;Decreased activity tolerance;Decreased balance;Decreased safety awareness;Decreased cognition;Decreased knowledge of use of DME;Pain       PT Treatment Interventions Therapeutic activities;Therapeutic exercise;DME instruction;Patient/family education;Functional mobility training;Balance training;Neuromuscular re-education    PT Goals (Current goals can be found in the Care Plan section)  Acute Rehab PT Goals Patient Stated Goal: unable to state PT Goal Formulation: Patient unable to participate in goal setting Time For Goal Achievement: 08/08/19 Potential to Achieve Goals: Fair    Frequency Min 3X/week   Barriers to discharge         Co-evaluation PT/OT/SLP Co-Evaluation/Treatment: Yes Reason for Co-Treatment: Complexity of the patient's impairments (multi-system involvement);For patient/therapist safety;Necessary to address cognition/behavior during functional activity;To address functional/ADL transfers PT goals addressed during session: Mobility/safety with mobility;Balance OT goals addressed during session: ADL's and self-care;Strengthening/ROM       AM-PAC PT "6 Clicks" Mobility  Outcome Measure Help needed turning from your back to your side while in a flat bed without using bedrails?: Total Help needed moving from lying on your back to sitting on the side of a flat bed without using bedrails?: Total Help needed moving to and from a bed to a chair (including a wheelchair)?: Total Help needed standing up from a chair using your arms (e.g., wheelchair or bedside chair)?: Total Help needed to walk in hospital room?: Total Help needed climbing 3-5 steps with a railing? : Total 6 Click Score: 6    End of Session Equipment Utilized During Treatment: Gait belt Activity Tolerance: Patient limited by fatigue;Other (comment)(limited command following) Patient left: in bed;with call bell/phone within reach;with bed alarm set;Other (comment);with SCD's reapplied(with mitts applied) Nurse Communication: Mobility status PT Visit Diagnosis: Other abnormalities of gait and mobility (R26.89);Difficulty in walking, not elsewhere classified (R26.2);Muscle weakness (generalized) (M62.81)    Time: [id number] PT Time Calculation (min) (ACUTE ONLY): 20 min   Charges:   PT Evaluation $PT Eval Low Complexity: 1 Low         Abdifatah Colquhoun E, PT Acute Rehabilitation Services Pager [phone]  Office [phone]  Diontae Route  D Lonza Shimabukuro 07/25/2019, 4:48 PM

## 2019-07-25 NOTE — Progress Notes (Signed)
Patient's oral and axillary temp is 103.1. Patient running tachy in one-teens, with RR between 20-30. Cold rag on forehead, ice packs on patient, fan at bedside, and Tylenol given. Provider called and updated. No new orders. Will continue to give Tylenol as needed.

## 2019-07-25 NOTE — Op Note (Signed)
Preoperative diagnosis: Lumbar epidural abscess cauda equina syndrome severe spinal stenosis L3-4 L4-5  Postoperative diagnosis: Same  Procedure: Decompressive lumbar laminectomies L3-4 L4-5 for evacuation of epidural abscess with partial medial facetectomies and foraminotomies of the L3-L4 and L5 nerve roots  Surgeon: Dominica Severin Katie Faraone  Assistant: Nash Shearer  Anesthesia: General  EBL: Minimal  HPI: 52 year old gentleman has had a month of progressive weakness in his legs became nonambulatory 24 hours ago and lost his ability to urinate.  Presented to the ER was worked up with CT scan and MRI scan showing epidural abscess with severe spinal stenosis as well as extensive psoas abscess.  Due to patient progression of clinical syndrome imaging findings I recommended decompression and evacuation of epidural abscess at L3-4 and L4-5.  Patient did receive vancomycin and Maxipime in the ER prior to diagnosis.  I extensively reviewed the risks and benefits of the operation with the patient as well as perioperative course expectations of outcome and alternatives of surgery and he understands and agrees to proceed forward.  Operative procedure: Patient brought into the OR was used and general anesthesia positioned prone on the Wilson frame his back was prepped and draped in routine sterile fashion preoperative x-ray localized the appropriate level so after infiltration of 10 cc lidocaine with epi both midline incision was made and Bovie letter cautery was used take down the subtenons tissue and subperiosteal dissection was carried out on the lamina of L3-L4 and L5.  Intraoperative x-ray confirmed defecation appropriate level so the spinous process at L3 and L4 was removed as well as the superior aspect of L5.  Central laminotomy was begun there was marked ligamentous hypertrophy this was moved in piecemeal fashion and once I remove the ligament at L3-4 a copious amount of purulent material came out this was all  cultured 2 sets of cultures were taken from the L34 dorsal thecal sac space an additional set was taken at L4-5 with some additional purulent material was identified then under bit the gutter on both sides removed extends amount of hypertrophied ligament as well as some spur at the inferior aspect of 3 4 and a dense amount of ligamentous overgrowth and a large spur at 4 5.  At the end of decompression was no further stenosis no further purulent material appreciated I was able to explore all foramina at L3, L4 and L5 as well as the disc spaces the wounds and copiously irrigated meticulous hemostasis was maintained Gelfoam was ON top of the dura medium Hemovac drain was placed and the wound was closed in layers with Vicryl skin was closed running 4 subcuticular Dermabond benzoin Steri-Strips and a sterile dressing was applied patient recovery in stable condition.  At the end the case all needle count sponge counts were correct.

## 2019-07-25 NOTE — Procedures (Signed)
Rt psoas abscess  S/p CT aspiration  No comp Stable ebl min 30cc pus asp

## 2019-07-25 NOTE — Evaluation (Signed)
Occupational Therapy Evaluation Patient Details Name: [name] MRN: [id number] DOB: [birth year] Today's Date: 07/25/2019    History of Present Illness Pt is a 53 yo male s/p acute osteomyelitis s/p abcess of psoas muscle with AMS and weakness.  Pt in OR for decompressive lumbar laminectomies L3-4 L4-5 (for evacuation of epidural abscess) with partial medial facetectomies and foraminotomies of the L3-L4 and L5 nerve roots 07/25/2019 by Dr. Saintclair Halsted. PMHx:b/l THAs   Clinical Impression   Pt PTA: Pt lives alone per chart. Pt currently limited by decreased strength, decreased mobility and decreased ability to care for self. Pt unable to follow all commands as pt unable to focus and having unintelligible string of sentences. attempt, but deferred; as pt was attempting to scoot back in bed and lean forward, his lower body extended and exhibiting posterior lean. Pt maxA +2 to totalA+2 for bed mobility and modA overall for stability in sitting. Pt maxA overall for ADL as pt with mitts on hands due to impulsive behavior. Pt not safe to stand at this time. Pt would benefit from continued OT skilled services for ADL, mobility and safety in CIR setting. OT following acutely.    Follow Up Recommendations  CIR;Supervision/Assistance - 24 hour    Equipment Recommendations  Other (comment)(to be determined at next venue)    Recommendations for Other Services       Precautions / Restrictions Precautions Precautions: Back;Fall Precaution Comments: Pt not mentally aware of precautions, but will review when pt more congitively aware Restrictions Weight Bearing Restrictions: No      Mobility Bed Mobility Overal bed mobility: Needs Assistance Bed Mobility: Rolling;Sidelying to Sit;Sit to Supine Rolling: Max assist Sidelying to sit: Max assist;+2 for physical assistance;+2 for safety/equipment;HOB elevated   Sit to supine: Max assist;Total assist;+2 for physical assistance;+2 for safety/equipment    General bed mobility comments: Pt unable to follow all commands as pt unable to focus and having unintelligible string of sentences.  Transfers Overall transfer level: Needs assistance               General transfer comment: attempt, but deferred; as pt was attempting to scoot back in bed and lean forward, his lower body extended and exhibiting posterior lean.     Balance Overall balance assessment: Needs assistance Sitting-balance support: Bilateral upper extremity supported;Feet supported Sitting balance-Leahy Scale: Poor Sitting balance - Comments: Intermittent assist modA to maxA for sitting EOB as pt unable to fully focus on task at hand. Mitts on hands.                                   ADL either performed or assessed with clinical judgement   ADL Overall ADL's : Needs assistance/impaired Eating/Feeding: NPO   Grooming: Moderate assistance;Sitting   Upper Body Bathing: Moderate assistance;Sitting   Lower Body Bathing: Maximal assistance;+2 for physical assistance;+2 for safety/equipment;Cueing for safety;Cueing for sequencing;Sitting/lateral leans;Sit to/from stand   Upper Body Dressing : Moderate assistance;Sitting   Lower Body Dressing: Maximal assistance;Cueing for safety;Sitting/lateral leans;Sit to/from stand   Toilet Transfer: Maximal assistance;Total assistance;+2 for physical assistance;+2 for safety/equipment;Cueing for safety Toilet Transfer Details (indicate cue type and reason): unable to attempt as pt unsafely leaning forward Toileting- Clothing Manipulation and Hygiene: Maximal assistance;Total assistance;+2 for physical assistance;+2 for safety/equipment;Cueing for sequencing;Sitting/lateral lean;Sit to/from stand       Functional mobility during ADLs: Maximal assistance;Total assistance;+2 for physical assistance;+2 for safety/equipment;Cueing for safety;Cueing for  sequencing General ADL Comments: Pt limited by decreased strength,  decreased mobility and decreased ability to care for self.     Vision Baseline Vision/History: No visual deficits Vision Assessment?: No apparent visual deficits     Perception     Praxis      Pertinent Vitals/Pain Pain Assessment: Faces Faces Pain Scale: Hurts little more Pain Location: back pain Pain Descriptors / Indicators: Discomfort;Grimacing;Guarding Pain Intervention(s): Limited activity within patient's tolerance;Monitored during session     Hand Dominance Right   Extremity/Trunk Assessment Upper Extremity Assessment Upper Extremity Assessment: Generalized weakness   Lower Extremity Assessment Lower Extremity Assessment: Generalized weakness   Cervical / Trunk Assessment Cervical / Trunk Assessment: Other exceptions Cervical / Trunk Exceptions: s/p lumbar sxs   Communication Communication Communication: Receptive difficulties;Expressive difficulties   Cognition Arousal/Alertness: Awake/alert Behavior During Therapy: Impulsive Overall Cognitive Status: Impaired/Different from baseline Area of Impairment: Orientation;Attention;Memory;Following commands;Safety/judgement;Awareness;Problem solving                 Orientation Level: Disoriented to;Place;Time;Situation Current Attention Level: Selective Memory: Decreased recall of precautions;Decreased short-term memory Following Commands: Follows one step commands inconsistently Safety/Judgement: Decreased awareness of safety;Decreased awareness of deficits Awareness: Intellectual Problem Solving: Slow processing;Decreased initiation;Difficulty sequencing;Requires verbal cues General Comments: Pt shouting out silly songs that were not pertinent to OT/PT session. Pt unable to orient self to hospital setting. Pt insistent upon neing on a boat and calling out names.   General Comments       Exercises     Shoulder Instructions      Home Living Family/patient expects to be discharged to:: Private  residence Living Arrangements: Alone                                      Prior Functioning/Environment          Comments: unable to obtain        OT Problem List: Decreased strength;Decreased activity tolerance;Impaired balance (sitting and/or standing);Decreased safety awareness;Pain;Decreased cognition;Impaired UE functional use;Decreased knowledge of use of DME or AE;Decreased knowledge of precautions      OT Treatment/Interventions: Self-care/ADL training;Therapeutic exercise;Neuromuscular education;Energy conservation;DME and/or AE instruction;Therapeutic activities;Visual/perceptual remediation/compensation;Patient/family education;Balance training;Cognitive remediation/compensation    OT Goals(Current goals can be found in the care plan section) Acute Rehab OT Goals Patient Stated Goal: unable to state OT Goal Formulation: Patient unable to participate in goal setting Time For Goal Achievement: 08/08/19 Potential to Achieve Goals: Good ADL Goals Pt Will Perform Grooming: with min guard assist;sitting;standing Pt Will Perform Lower Body Dressing: with min assist;sitting/lateral leans;sit to/from stand;with adaptive equipment Pt Will Transfer to Toilet: with min assist;ambulating;bedside commode;stand pivot transfer Pt Will Perform Toileting - Clothing Manipulation and hygiene: with mod assist;sitting/lateral leans;sit to/from stand Pt/caregiver will Perform Home Exercise Program: Increased strength;Both right and left upper extremity Additional ADL Goal #1: Pt will follow 100% of commands in 3/5 trials.  OT Frequency: Min 2X/week   Barriers to D/C:            Co-evaluation PT/OT/SLP Co-Evaluation/Treatment: Yes Reason for Co-Treatment: Complexity of the patient's impairments (multi-system involvement);To address functional/ADL transfers;For patient/therapist safety   OT goals addressed during session: ADL's and self-care;Strengthening/ROM       AM-PAC OT "6 Clicks" Daily Activity     Outcome Measure Help from another person eating meals?: A Lot Help from another person taking care of personal grooming?: A Lot Help from another person toileting, which  includes using toliet, bedpan, or urinal?: A Lot Help from another person bathing (including washing, rinsing, drying)?: A Lot Help from another person to put on and taking off regular upper body clothing?: A Lot Help from another person to put on and taking off regular lower body clothing?: Total 6 Click Score: 11   End of Session Equipment Utilized During Treatment: Gait belt Nurse Communication: Mobility status;Other (comment)(to check flexi seal.)  Activity Tolerance: Treatment limited secondary to medical complications (Comment)(AMS) Patient left: in bed;with call bell/phone within reach;with bed alarm set  OT Visit Diagnosis: Unsteadiness on feet (R26.81);Muscle weakness (generalized) (M62.81)                Time: [id number] OT Time Calculation (min): 18 min Charges:  OT General Charges $OT Visit: 1 Visit OT Evaluation $OT Eval Moderate Complexity: Ramona OTR/L Acute Rehabilitation Services Pager: [phone] Office: [phone] C 07/25/2019, 4:29 PM

## 2019-07-25 NOTE — Progress Notes (Signed)
Rehab Admissions Coordinator Note:  Per PT and OT recommendation, this patient was screened by Raechel Ache for appropriateness for an Inpatient Acute Rehab Consult.  At this time, we are recommending Inpatient Rehab consult. Please have attending service place consult order in the chart to allow for further assessment of pt's candidacy for CIR.   Raechel Ache 07/25/2019, 5:17 PM  I can be reached at [phone].

## 2019-07-26 ENCOUNTER — Inpatient Hospital Stay (HOSPITAL_COMMUNITY): Payer: BC Managed Care – PPO

## 2019-07-26 DIAGNOSIS — Z9889 Other specified postprocedural states: Secondary | ICD-10-CM

## 2019-07-26 DIAGNOSIS — F1020 Alcohol dependence, uncomplicated: Secondary | ICD-10-CM

## 2019-07-26 DIAGNOSIS — M48061 Spinal stenosis, lumbar region without neurogenic claudication: Secondary | ICD-10-CM

## 2019-07-26 DIAGNOSIS — G061 Intraspinal abscess and granuloma: Secondary | ICD-10-CM

## 2019-07-26 DIAGNOSIS — Z91030 Bee allergy status: Secondary | ICD-10-CM

## 2019-07-26 DIAGNOSIS — R410 Disorientation, unspecified: Secondary | ICD-10-CM

## 2019-07-26 DIAGNOSIS — G834 Cauda equina syndrome: Secondary | ICD-10-CM

## 2019-07-26 DIAGNOSIS — Z96643 Presence of artificial hip joint, bilateral: Secondary | ICD-10-CM

## 2019-07-26 DIAGNOSIS — Z87891 Personal history of nicotine dependence: Secondary | ICD-10-CM

## 2019-07-26 DIAGNOSIS — R7881 Bacteremia: Secondary | ICD-10-CM

## 2019-07-26 DIAGNOSIS — Z885 Allergy status to narcotic agent status: Secondary | ICD-10-CM

## 2019-07-26 DIAGNOSIS — B9561 Methicillin susceptible Staphylococcus aureus infection as the cause of diseases classified elsewhere: Secondary | ICD-10-CM

## 2019-07-26 LAB — COMPREHENSIVE METABOLIC PANEL
ALT: 27 U/L (ref 0–44)
AST: 25 U/L (ref 15–41)
Albumin: 1.8 g/dL — ABNORMAL LOW (ref 3.5–5.0)
Alkaline Phosphatase: 92 U/L (ref 38–126)
Anion gap: 9 (ref 5–15)
BUN: 19 mg/dL (ref 6–20)
CO2: 27 mmol/L (ref 22–32)
Calcium: 8.6 mg/dL — ABNORMAL LOW (ref 8.9–10.3)
Chloride: 100 mmol/L (ref 98–111)
Creatinine, Ser: 0.87 mg/dL (ref 0.61–1.24)
GFR calc Af Amer: >60 mL/min (ref >60)
GFR calc non Af Amer: >60 mL/min (ref >60)
Glucose, Bld: 106 mg/dL — ABNORMAL HIGH (ref 70–99)
Potassium: 4.2 mmol/L (ref 3.5–5.1)
Sodium: 136 mmol/L (ref 135–145)
Total Bilirubin: 1 mg/dL (ref 0.3–1.2)
Total Protein: 7 g/dL (ref 6.5–8.1)

## 2019-07-26 LAB — GC/CHLAMYDIA PROBE AMP (~~LOC~~) NOT AT ARMC
Chlamydia: NEGATIVE
Neisseria Gonorrhea: NEGATIVE

## 2019-07-26 IMAGING — MR MR HEAD WO/W CM
7 of 13 series · 21 of 48 positions shown · IV contrast (10 ML GADAVIST)
Comparison: None.

CLINICAL DATA: Bacteremia.  Endocarditis.  L3-4 spinal infection.

EXAM:
MRI HEAD WITHOUT AND WITH CONTRAST
TECHNIQUE: Multiplanar, multiecho pulse sequences of the brain and surrounding
structures were obtained without and with intravenous contrast.
CONTRAST:  10mL GADAVIST GADOBUTROL 1 MMOL/ML IV SOLN

[Series 2: DWI · axial · 3.0mm · 0.94mm/px · z∈[-76,+74]mm · 6 of 102 slices shown (1 of 2)]
[im 1/102]
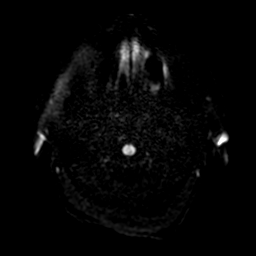
[im 21/102]
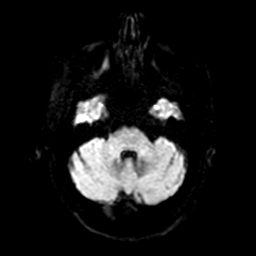
[im 41/102]
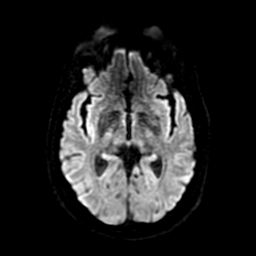
[im 61/102]
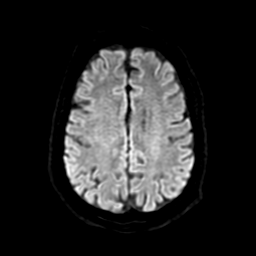
[im 81/102]
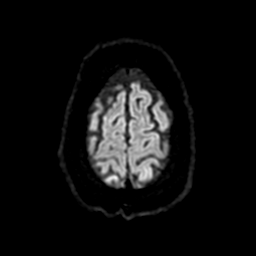
[im 102/102]
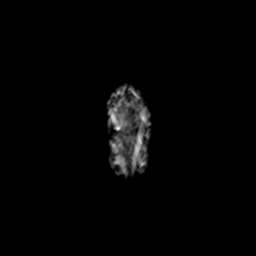

[Series 3: DWI · coronal · 4.0mm · 0.94mm/px · 5 of 74 slices shown (2 of 2)]
[im 1/74]
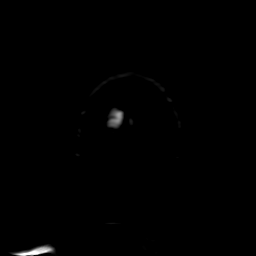
[im 19/74]
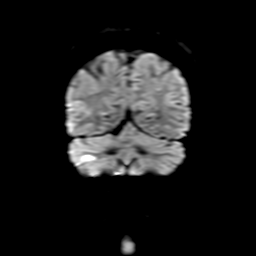
[im 37/74]
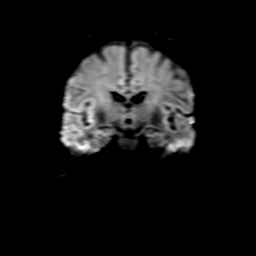
[im 55/74]
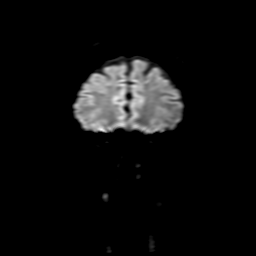
[im 74/74]
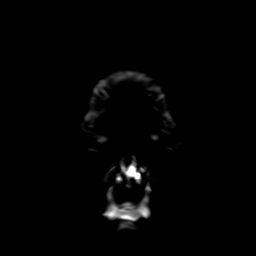

[Series 4: FLAIR · sagittal · 5.0mm · 0.23mm/px · 2 of 26 slices shown (1 of 2)]
[im 1/26]
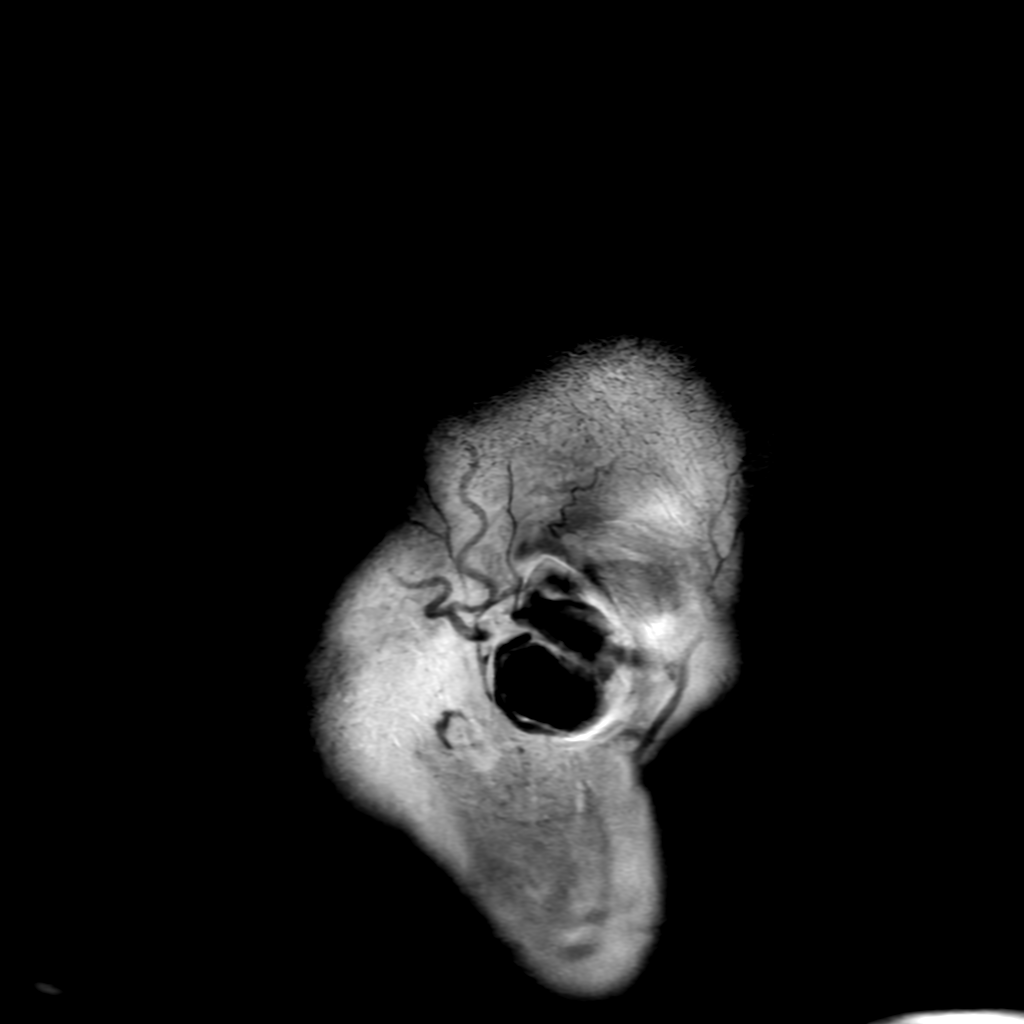
[im 26/26]
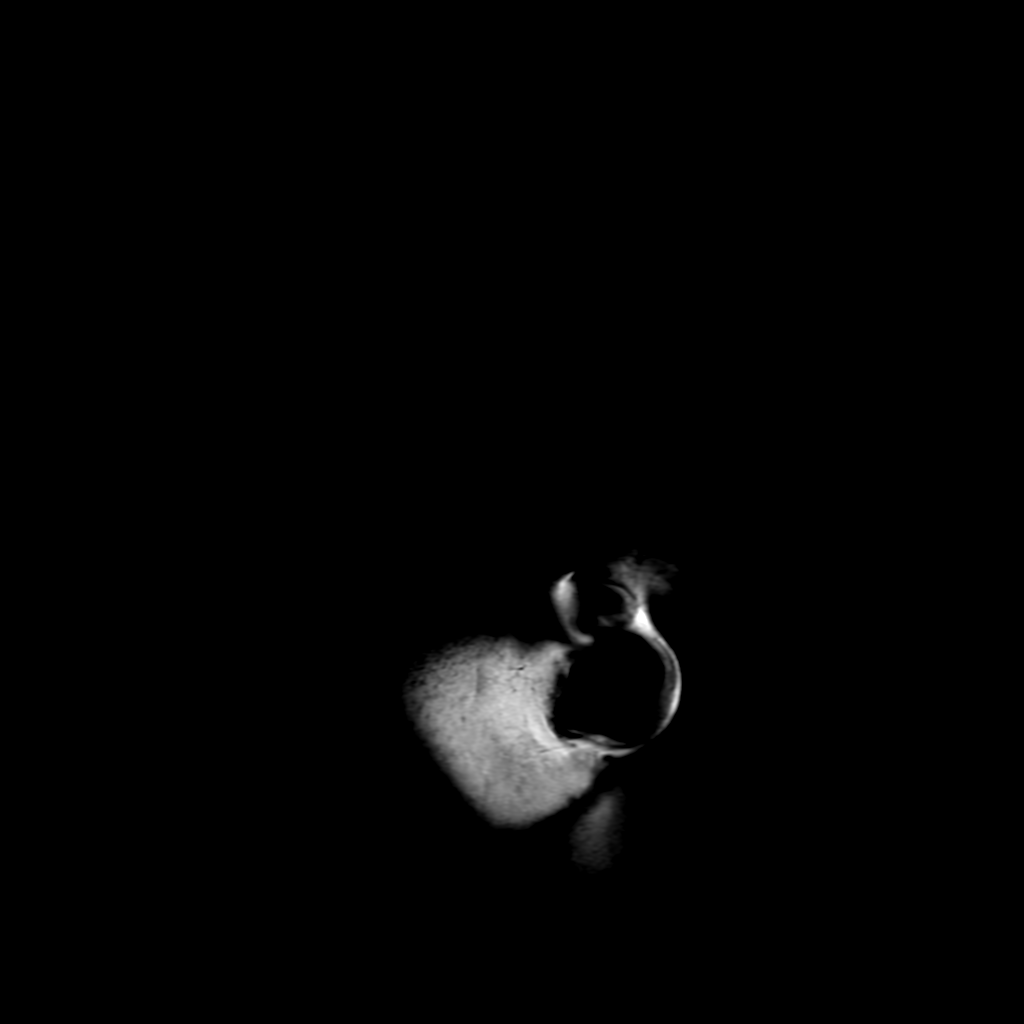

[Series 5: T2 · axial · 5.0mm · 0.23mm/px · 1 of 26 slices shown]
[im 1/26]
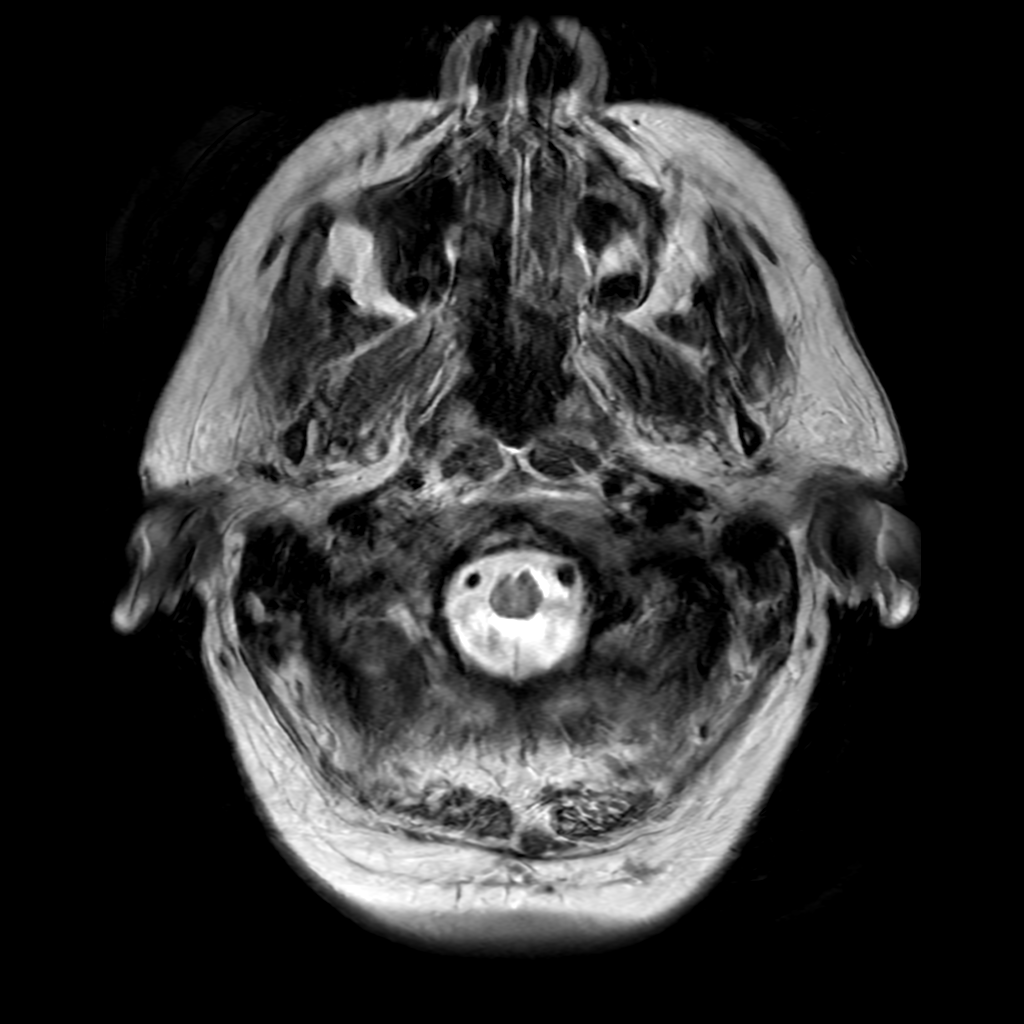

[Series 6: FLAIR · axial · 3.0mm · 0.47mm/px · z∈[-72,+78]mm · 2 of 26 slices shown (2 of 2)]
[im 1/26]
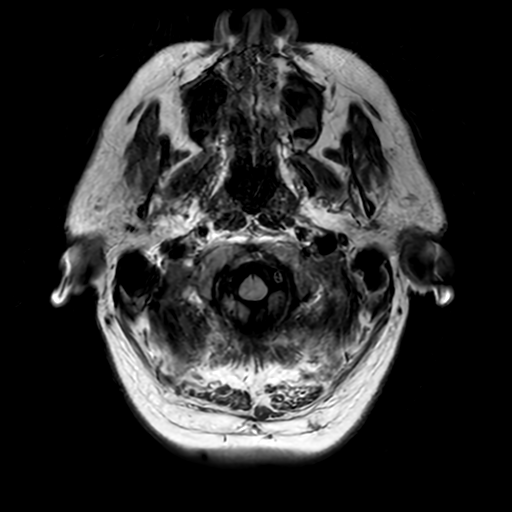
[im 26/26]
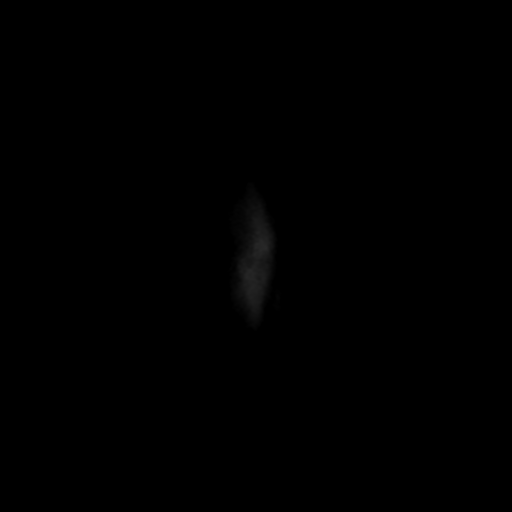

[Series 250: ADC · axial · 3.0mm · 0.94mm/px · z∈[-76,+74]mm · 3 of 51 slices shown (1 of 2)]
[im 1/51]
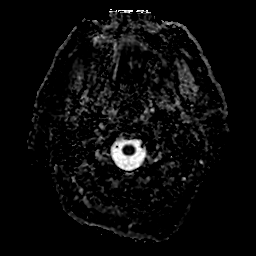
[im 26/51]
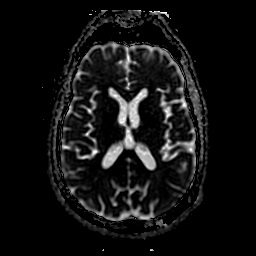
[im 51/51]
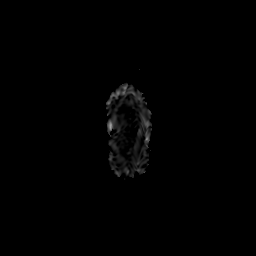

[Series 350: ADC · coronal · 4.0mm · 0.94mm/px · 2 of 37 slices shown (2 of 2)]
[im 1/37]
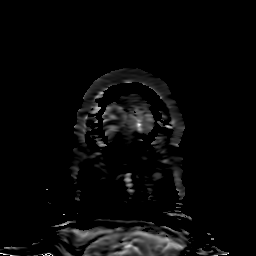
[im 37/37]
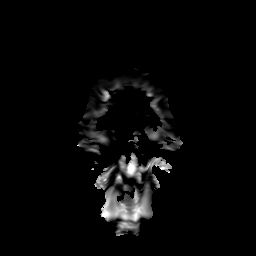

[21 of 48 positions shown; findings below may reference images not displayed]

FINDINGS: Brain: Diffusion imaging shows a 16 mm acute infarction in the
peripheral right cerebellum. No hemorrhage or contrast enhancement
in this location. Cerebral hemispheres show a punctate acute
infarction at the right frontoparietal vertex. No other acute
finding. The brainstem and cerebellum are otherwise normal. Cerebral
hemispheres elsewhere are normal without evidence of old
infarctions. No mass, hemorrhage, hydrocephalus or extra-axial
collection. No abnormal brain or leptomeningeal enhancement.

Vascular: Major vessels at the base of the brain show flow.

Skull and upper cervical spine: Negative

Sinuses/Orbits: Clear/normal

Other: None
IMPRESSION: 16 mm acute infarction in the peripheral right cerebellum.

Punctate acute infarction at the right frontoparietal vertex.

Differing vascular territories raise the possibility of embolic
disease from the heart or ascending aorta. There is no evidence of
hemorrhage or contrast enhancement that would allow me to make the
specific diagnosis of septic emboli, but that is obviously the
concern given the clinical history.

## 2019-07-26 MED ORDER — NAFCILLIN SODIUM 2 G IJ SOLR: 2.0000 g | INTRAMUSCULAR | Status: DC

## 2019-07-26 MED ORDER — CHLORHEXIDINE GLUCONATE CLOTH 2 % EX PADS
6.0000 | MEDICATED_PAD | Freq: Every day | CUTANEOUS | Status: DC
  Administered 2019-07-27 – 2019-07-30 (×4): 6 via TOPICAL

## 2019-07-26 MED ORDER — SODIUM CHLORIDE 0.9 % IV SOLN
2.0000 g | INTRAVENOUS | Status: DC
  Administered 2019-07-26 – 2019-07-31 (×31): 2 g via INTRAVENOUS
  Filled 2019-07-26 (×34): qty 2000

## 2019-07-26 MED ORDER — GADOBUTROL 1 MMOL/ML IV SOLN
10.0000 mL | Freq: Once | INTRAVENOUS | Status: AC | PRN
  Administered 2019-07-26: 10 mL via INTRAVENOUS

## 2019-07-26 NOTE — Progress Notes (Signed)
Pt. Lethargic this am, but able to be aroused with speech and non-painful touch. Pt. Able to state his name and the year, but not location. He has continued to been lethargic most of the day. Dr. Saintclair Halsted was attempted to be contacted for clarification of some orders, but was not heard back from. Dr. Zada Finders was reached at around 1800 and confirmed removal of the pt.'s foley. The pt.'s diet has also been advanced from NPO to a regular diet. The daughter has requested an update on the results of the brain MRI when a Dr. Is available to discuss.

## 2019-07-26 NOTE — Consult Note (Signed)
Date of Admission:  07/24/2019          Reason for Consult: MSSA bacteremia and epidural abscess    Referring Provider: Dr. Saintclair Halsted   Assessment:  1. Complicated MSSA bacteremia 2. Lumbar epidural abscess cauda equina syndrome severe spinal stenosis L3-4 L4-5 sp Decompressive lumbar laminectomies L3-4 L4-5 for evacuation of epidural abscess with partial medial facetectomies and foraminotomies of the L3-L4 and L5 nerve roots 3. Psoas abscess sp IR guided aspiration 4. Hx of prosthetic hip 5. Alcoholism 6. Delirium  Plan:  1. Change from cefazolin to nafcillin given concern he could have endocarditis with septic emboli to the brain 2. MRI of the brain with contrast 3. He will need a transesophageal echocardiogram but I think that will not happen until Monday 4. Hopefully when he becomes more lucid we can question him about other symptoms I do have anxiety about the possibility that his prosthetic joint could have been seeded 5. DO NOT PLACE CENTRAL LINE UNTIL WE HAVE CLEARED HIS BACTEREMIA   Principal Problem:   Epidural abscess, L2-L5 Active Problems:   S/P lumbar laminectomy   Scheduled Meds: . Chlorhexidine Gluconate Cloth  6 each Topical Daily  . LORazepam  0-4 mg Intravenous Q6H   Or  . LORazepam  0-4 mg Oral Q6H  . [START ON 07/27/2019] LORazepam  0-4 mg Intravenous Q12H   Or  . [START ON 07/27/2019] LORazepam  0-4 mg Oral Q12H  . pantoprazole (PROTONIX) IV  40 mg Intravenous QHS  . predniSONE  15 mg Oral Q breakfast  . sodium chloride flush  10-40 mL Intracatheter Q12H  . sodium chloride flush  3 mL Intravenous Q12H  . thiamine  100 mg Oral Daily   Or  . thiamine  100 mg Intravenous Daily   Continuous Infusions: . sodium chloride 250 mL (07/25/19 0547)  . nafcillin IV     PRN Meds:.acetaminophen **OR** acetaminophen, alum & mag hydroxide-simeth, cyclobenzaprine, HYDROmorphone (DILAUDID) injection, ibuprofen, menthol-cetylpyridinium **OR** phenol, ondansetron  **OR** ondansetron (ZOFRAN) IV, oxyCODONE, sodium chloride flush, sodium chloride flush  HPI: [name] is a 53 y.o. male with past medical history African for bilateral total hip arthroplasties, alcoholism who went to the emergency department with pain in his back and right leg with progressive weakness in lower extremities and back pain with progressive weakness and left foot drop and becoming unable to walk. His imaging showed Lumbar epidural abscess cauda equina syndrome severe spinal stenosis L3-4 L4-5 and he was taken to the OR and underwent Decompressive lumbar laminectomies L3-4 L4-5 for evacuation of epidural abscess with partial medial facetectomies and foraminotomies of the L3-L4 and L5 nerve roots.  In the interim his admission blood cultures have been positive for methicillin sensitive Staph aureus.  He was appropriately narrowed to cefazolin.  Interventional radiology have drained his psoas abscess.  Is growing Staph aureus from all areas that have been debrided or aspirated undoubtedly the same organism that is growing from his blood.  At present he is obtunded and restrained.  I am concerned he could have endocarditis with septic emboli to the brain and therefore I am changing him to nafcillin for better CNS penetration.    Review of Systems: Review of Systems  Unable to perform ROS: Critical illness    Past Medical History:  Diagnosis Date  . History of chicken pox   . Medical history non-contributory     Social History   Tobacco Use  . Smoking status: Former Smoker  Packs/day: 1.00    Years: 14.00    Pack years: 14.00    Types: Cigarettes    Quit date: 07/27/1999    Years since quitting: 20.0  . Smokeless tobacco: Never Used  Substance Use Topics  . Alcohol use: Yes    Alcohol/week: 24.0 standard drinks    Types: 24 Cans of beer per week    Comment: 4-5 beers per day, case of beer per week  . Drug use: No    Family History  Problem Relation Age of Onset    . Cancer Father        Hodgkin's disease  . COPD Mother   . Heart attack Maternal Grandfather 80  . Diabetes Neg Hx   . Stroke Neg Hx   . Hypertension Neg Hx   . Hyperlipidemia Neg Hx    Allergies  Allergen Reactions  . Bee Venom Anaphylaxis  . Hydrocodone Nausea And Vomiting    OBJECTIVE: Blood pressure 116/74, pulse 76, temperature 99.7 F (37.6 C), temperature source Oral, resp. rate (!) 23, height 6\' 1"  (1.854 m), weight 106.6 kg, SpO2 97 %.  Physical Exam Constitutional:      Appearance: He is obese.  HENT:     Head: Normocephalic and atraumatic.     Nose: Nose normal.  Eyes:     Extraocular Movements: Extraocular movements intact.  Cardiovascular:     Rate and Rhythm: Normal rate and regular rhythm.     Heart sounds: No murmur. No friction rub. No gallop.   Pulmonary:     Effort: Pulmonary effort is normal. No respiratory distress.     Breath sounds: Normal breath sounds. No stridor. No wheezing or rhonchi.  Abdominal:     General: Abdomen is flat.     Palpations: Abdomen is soft.  Skin:    General: Skin is warm and dry.  Neurological:     Mental Status: He is disoriented.     Lab Results Lab Results  Component Value Date   WBC 19.9 (H) 07/24/2019   HGB 13.6 07/24/2019   HCT 40.4 07/24/2019   MCV 97.8 07/24/2019   PLT 407 (H) 07/24/2019    Lab Results  Component Value Date   CREATININE 0.75 07/24/2019   BUN 20 07/24/2019   NA 128 (L) 07/24/2019   K 3.4 (L) 07/24/2019   CL 90 (L) 07/24/2019   CO2 25 07/24/2019    Lab Results  Component Value Date   ALT 49 (H) 07/24/2019   AST 34 07/24/2019   ALKPHOS 122 07/24/2019   BILITOT 1.2 07/24/2019     Microbiology: Recent Results (from the past 240 hour(s))  Urine culture     Status: Abnormal (Preliminary result)   Collection Time: 07/24/19  2:57 PM   Specimen: Urine, Random  Result Value Ref Range Status   Specimen Description   Final    URINE, RANDOM Performed at [facility] [address] [place], [place] [address]    Special Requests   Final    NONE Performed at [facility], [place] [address], [place], [place] [address]    Culture (A)  Final    >=100,000 COLONIES/mL STAPHYLOCOCCUS AUREUS REPEATING SUSCEPTIBILITIES Performed at [facility], [place] [address], [place], [place] [address]    Report Status PENDING  Incomplete  Culture, blood (routine x 2)     Status: Abnormal (Preliminary result)   Collection Time: 07/24/19  3:10 PM   Specimen: BLOOD RIGHT HAND  Result Value Ref Range Status   Specimen Description   Final    BLOOD RIGHT HAND Performed at [facility] [address], [place], [place] [address]    Special Requests   Final    BOTTLES DRAWN AEROBIC ONLY Blood Culture results may not be optimal due to an inadequate volume of blood received in culture bottles Performed at [facility] [address] [place], [place] [address]    Culture  Setup Time   Final    AEROBIC BOTTLE ONLY GRAM POSITIVE COCCI CRITICAL VALUE NOTED.  VALUE IS CONSISTENT WITH PREVIOUSLY REPORTED AND CALLED VALUE.    Culture (A)  Final    STAPHYLOCOCCUS AUREUS SUSCEPTIBILITIES TO FOLLOW Performed at [facility], [place] [address] [place], [place] [address]    Report Status PENDING  Incomplete  Culture, blood (routine x 2)     Status: Abnormal (Preliminary result)   Collection Time: 07/24/19  3:10 PM   Specimen: BLOOD  Result Value Ref Range Status   Specimen Description   Final    BLOOD LEFT ANTECUBITAL Performed at [facility] [address], [place], [place] [address]    Special Requests   Final    BOTTLES DRAWN AEROBIC AND ANAEROBIC Blood Culture adequate volume Performed at [facility] [address] [place], [place] [address]    Culture  Setup Time   Final    GRAM POSITIVE COCCI IN BOTH AEROBIC AND ANAEROBIC BOTTLES CRITICAL RESULT  CALLED TO, READ BACK BY AND VERIFIED WITH: Andres Shad PharmD 9:00 07/25/19 ([email or web address])    Culture (A)  Final    STAPHYLOCOCCUS AUREUS SUSCEPTIBILITIES TO FOLLOW Performed at [facility], [place] [address], [place],  [address]    Report Status PENDING  Incomplete  Blood Culture ID Panel (Reflexed)     Status: Abnormal   Collection Time: 07/24/19  3:10 PM  Result Value Ref Range Status   Enterococcus species NOT DETECTED NOT DETECTED Final   Listeria monocytogenes NOT DETECTED NOT DETECTED Final   Staphylococcus species DETECTED (A) NOT DETECTED Final    Comment: CRITICAL RESULT CALLED TO, READ BACK BY AND VERIFIED WITH: Andres Shad PharmD 9:00 07/25/19 ([email or web address])    Staphylococcus aureus (BCID) DETECTED (A) NOT DETECTED Final    Comment: Methicillin (oxacillin) susceptible Staphylococcus aureus (MSSA). Preferred therapy is anti staphylococcal beta lactam antibiotic (Cefazolin or Nafcillin), unless clinically contraindicated. CRITICAL RESULT CALLED TO, READ BACK BY AND VERIFIED WITH: Andres Shad PharmD 9:00 07/25/19 ([email or web address])    Methicillin resistance NOT DETECTED NOT DETECTED Final   Streptococcus species NOT DETECTED NOT DETECTED Final   Streptococcus agalactiae NOT DETECTED NOT DETECTED Final   Streptococcus pneumoniae NOT DETECTED NOT DETECTED Final   Streptococcus pyogenes NOT DETECTED NOT DETECTED Final   Acinetobacter baumannii NOT DETECTED NOT DETECTED Final   Enterobacteriaceae species NOT DETECTED NOT DETECTED Final   Enterobacter cloacae complex NOT DETECTED NOT DETECTED Final   Escherichia coli NOT DETECTED NOT DETECTED Final   Klebsiella oxytoca NOT DETECTED NOT DETECTED Final   Klebsiella pneumoniae NOT DETECTED NOT DETECTED Final   Proteus species NOT DETECTED NOT DETECTED Final   Serratia marcescens NOT DETECTED NOT DETECTED Final   Haemophilus influenzae NOT DETECTED NOT DETECTED Final   Neisseria meningitidis NOT DETECTED NOT DETECTED Final   Pseudomonas  aeruginosa NOT DETECTED NOT DETECTED Final   Candida albicans NOT DETECTED NOT DETECTED Final   Candida glabrata NOT DETECTED NOT DETECTED Final  Candida krusei NOT DETECTED NOT DETECTED Final   Candida parapsilosis NOT DETECTED NOT DETECTED Final   Candida tropicalis NOT DETECTED NOT DETECTED Final    Comment: Performed at [facility], [place] [address] [place], [place] [address]  Respiratory Panel by RT PCR (Flu A&B, Covid) - Nasopharyngeal Swab     Status: None   Collection Time: 07/24/19  8:34 PM   Specimen: Nasopharyngeal Swab  Result Value Ref Range Status   SARS Coronavirus 2 by RT PCR NEGATIVE NEGATIVE Final    Comment: (NOTE) SARS-CoV-2 target nucleic acids are NOT DETECTED. The SARS-CoV-2 RNA is generally detectable in upper respiratoy specimens during the acute phase of infection. The lowest concentration of SARS-CoV-2 viral copies this assay can detect is 131 copies/mL. A negative result does not preclude SARS-Cov-2 infection and should not be used as the sole basis for treatment or other patient management decisions. A negative result may occur with  improper specimen collection/handling, submission of specimen other than nasopharyngeal swab, presence of viral mutation(s) within the areas targeted by this assay, and inadequate number of viral copies (<131 copies/mL). A negative result must be combined with clinical observations, patient history, and epidemiological information. The expected result is Negative. Fact Sheet for Patients:  PinkCheek.be Fact Sheet for Healthcare Providers:  GravelBags.it This test is not yet ap proved or cleared by the [place] FDA and  has been authorized for detection and/or diagnosis of SARS-CoV-2 by FDA under an Emergency Use Authorization (EUA). This EUA will remain  in effect (meaning this test can be used) for the duration of the COVID-19 declaration under Section  564(b)(1) of the Act, 21 U.S.C. section 360bbb-3(b)(1), unless the authorization is terminated or revoked sooner.    Influenza A by PCR NEGATIVE NEGATIVE Final   Influenza B by PCR NEGATIVE NEGATIVE Final    Comment: (NOTE) The Xpert Xpress SARS-CoV-2/FLU/RSV assay is intended as an aid in  the diagnosis of influenza from Nasopharyngeal swab specimens and  should not be used as a sole basis for treatment. Nasal washings and  aspirates are unacceptable for Xpert Xpress SARS-CoV-2/FLU/RSV  testing. Fact Sheet for Patients: PinkCheek.be Fact Sheet for Healthcare Providers: GravelBags.it This test is not yet approved or cleared by the [place] FDA and  has been authorized for detection and/or diagnosis of SARS-CoV-2 by  FDA under an Emergency Use Authorization (EUA). This EUA will remain  in effect (meaning this test can be used) for the duration of the  Covid-19 declaration under Section 564(b)(1) of the Act, 21  U.S.C. section 360bbb-3(b)(1), unless the authorization is  terminated or revoked. Performed at [facility], [place] [address], [place], [place] [address]   Aerobic/Anaerobic Culture (surgical/deep wound)     Status: None (Preliminary result)   Collection Time: 07/25/19  1:43 AM   Specimen: Abscess  Result Value Ref Range Status   Specimen Description ABSCESS  Final   Special Requests EPIDURAL SAMPLE A  Final   Gram Stain   Final    ABUNDANT WBC PRESENT, PREDOMINANTLY PMN ABUNDANT GRAM POSITIVE COCCI    Culture   Final    MODERATE STAPHYLOCOCCUS AUREUS SUSCEPTIBILITIES TO FOLLOW Performed at [facility], [place] [address], [place], [place] [address]    Report Status PENDING  Incomplete  Aerobic/Anaerobic Culture (surgical/deep wound)     Status: None (Preliminary result)   Collection Time: 07/25/19  1:46 AM   Specimen: Abscess  Result Value Ref Range Status   Specimen  Description  ABSCESS  Final   Special Requests EPIDURAL SAMPLE B  Final   Gram Stain   Final    RARE WBC PRESENT, PREDOMINANTLY PMN NO ORGANISMS SEEN    Culture   Final    FEW STAPHYLOCOCCUS AUREUS SUSCEPTIBILITIES TO FOLLOW Performed at [facility], [place] [address] [place], [place] [address]    Report Status PENDING  Incomplete  Aerobic/Anaerobic Culture (surgical/deep wound)     Status: None (Preliminary result)   Collection Time: 07/25/19  1:58 AM   Specimen: Abscess  Result Value Ref Range Status   Specimen Description ABSCESS  Final   Special Requests EPIDURAL SAMPLE C  Final   Gram Stain   Final    ABUNDANT WBC PRESENT, PREDOMINANTLY PMN ABUNDANT GRAM POSITIVE COCCI    Culture   Final    MODERATE STAPHYLOCOCCUS AUREUS SUSCEPTIBILITIES TO FOLLOW Performed at [facility], [place] [address], [place], [place] [address]    Report Status PENDING  Incomplete  Aerobic/Anaerobic Culture (surgical/deep wound)     Status: None (Preliminary result)   Collection Time: 07/25/19  1:26 PM   Specimen: Abscess  Result Value Ref Range Status   Specimen Description ABSCESS  Final   Special Requests Normal  Final   Gram Stain   Final    FEW WBC PRESENT, PREDOMINANTLY PMN MODERATE GRAM POSITIVE COCCI    Culture   Final    MODERATE STAPHYLOCOCCUS AUREUS SUSCEPTIBILITIES TO FOLLOW Performed at [facility], [place] [address] [place], [place] [address]    Report Status PENDING  Incomplete    Alcide Evener, [facility] [phone] pager  07/26/2019, 12:32 PM

## 2019-07-26 NOTE — Progress Notes (Signed)
Neurosurgery Service Progress Note  Subjective: No acute events overnight   Objective: Vitals:   07/26/19 0641 07/26/19 0822 07/26/19 1200 07/26/19 1635  BP: (!) 100/58 131/70 116/74 127/84  Pulse:  82 76 75  Resp:  (!) 25 (!) 23 (!) 25  Temp:  99.2 F (37.3 C) 99.7 F (37.6 C) 98.9 F (37.2 C)  TempSrc:  Oral Oral Oral  SpO2:  98% 97% 98%  Weight:      Height:       Temp (24hrs), Avg:100 F (37.8 C), Min:98.9 F (37.2 C), Max:103.1 F (39.5 C)  CBC Latest Ref Rng & Units 07/24/2019 07/18/2019 10/14/2016  WBC 4.0 - 10.5 K/uL 19.9(H) 17.3(H) 8.7  Hemoglobin 13.0 - 17.0 g/dL 13.6 13.6 10.8(L)  Hematocrit 39.0 - 52.0 % 40.4 40.3 32.8(L)  Platelets 150 - 400 K/uL 407(H) 645.0(H) 151   BMP Latest Ref Rng & Units 07/24/2019 07/18/2019 10/14/2016  Glucose 70 - 99 mg/dL 105(H) 100(H) 107(H)  BUN 6 - 20 mg/dL 20 21 <5(L)  Creatinine 0.61 - 1.24 mg/dL 0.75 0.91 0.80  Sodium 135 - 145 mmol/L 128(L) 132(L) 135  Potassium 3.5 - 5.1 mmol/L 3.4(L) 4.4 4.0  Chloride 98 - 111 mmol/L 90(L) 97 98(L)  CO2 22 - 32 mmol/L 25 24 28   Calcium 8.9 - 10.3 mg/dL 9.1 9.4 8.4(L)    Intake/Output Summary (Last 24 hours) at 07/26/2019 1835 Last data filed at 07/26/2019 [id number] Gross per 24 hour  Intake --  Output 1440 ml  Net -1440 ml    Current Facility-Administered Medications:  .  0.9 %  sodium chloride infusion, 250 mL, Intravenous, Continuous, Kary Kos, MD, Last Rate: 1 mL/hr at 07/25/19 0547, 250 mL at 07/25/19 0547 .  acetaminophen (TYLENOL) tablet 650 mg, 650 mg, Oral, Q4H PRN, 650 mg at 07/25/19 2020 **OR** acetaminophen (TYLENOL) suppository 650 mg, 650 mg, Rectal, Q4H PRN, Kary Kos, MD, 650 mg at 07/26/19 [id number] .  alum & mag hydroxide-simeth (MAALOX/MYLANTA) 200-200-20 MG/5ML suspension 30 mL, 30 mL, Oral, Q6H PRN, Kary Kos, MD .  Chlorhexidine Gluconate Cloth 2 % PADS 6 each, 6 each, Topical, Daily, Kary Kos, MD .  cyclobenzaprine (FLEXERIL) tablet 10 mg, 10 mg, Oral, TID PRN,  Kary Kos, MD .  HYDROmorphone (DILAUDID) injection 0.5 mg, 0.5 mg, Intravenous, Q2H PRN, Kary Kos, MD .  ibuprofen (ADVIL) tablet 800 mg, 800 mg, Oral, Q8H PRN, Kary Kos, MD .  Derrill Memo ON 07/27/2019] LORazepam (ATIVAN) injection 0-4 mg, 0-4 mg, Intravenous, Q12H **OR** [START ON 07/27/2019] LORazepam (ATIVAN) tablet 0-4 mg, 0-4 mg, Oral, Q12H, Joy, Shawn C, PA-C .  menthol-cetylpyridinium (CEPACOL) lozenge 3 mg, 1 lozenge, Oral, PRN **OR** phenol (CHLORASEPTIC) mouth spray 1 spray, 1 spray, Mouth/Throat, PRN, Kary Kos, MD .  nafcillin 2 g in sodium chloride 0.9 % 100 mL IVPB, 2 g, Intravenous, Q4H, Kary Kos, MD, Last Rate: 200 mL/hr at 07/26/19 1731, 2 g at 07/26/19 1731 .  ondansetron (ZOFRAN) tablet 4 mg, 4 mg, Oral, Q6H PRN **OR** ondansetron (ZOFRAN) injection 4 mg, 4 mg, Intravenous, Q6H PRN, Kary Kos, MD .  oxyCODONE (Oxy IR/ROXICODONE) immediate release tablet 10 mg, 10 mg, Oral, Q3H PRN, Kary Kos, MD .  pantoprazole (PROTONIX) injection 40 mg, 40 mg, Intravenous, Einar Grad, MD, 40 mg at 07/25/19 2232 .  predniSONE (DELTASONE) tablet 15 mg, 15 mg, Oral, Q breakfast, Kary Kos, MD, 15 mg at 07/26/19 0930 .  sodium chloride flush (NS) 0.9 % injection 10-40 mL, 10-40 mL, Intracatheter,  Kandra Nicolas, MD, 10 mL at 07/25/19 0919 .  sodium chloride flush (NS) 0.9 % injection 10-40 mL, 10-40 mL, Intracatheter, PRN, Kary Kos, MD .  sodium chloride flush (NS) 0.9 % injection 3 mL, 3 mL, Intravenous, Q12H, Kary Kos, MD, 3 mL at 07/25/19 0919 .  sodium chloride flush (NS) 0.9 % injection 3 mL, 3 mL, Intravenous, PRN, Kary Kos, MD .  thiamine tablet 100 mg, 100 mg, Oral, Daily, 100 mg at 07/26/19 0930 **OR** thiamine (B-1) injection 100 mg, 100 mg, Intravenous, Daily, Joy, Shawn C, PA-C, 100 mg at 07/25/19 0919   Physical Exam: Obtunded, eyes open to painful stimulus and answers in single-word answers with effort, FC with continued effort x4 but significantly slower and  requiring more effort in LUE than RUE, takes significant effort in LLE with minimal movement  Assessment & Plan: 53 y.o. man w/ lethargy, lumbar epidural abscess s/p laminectomy and evacuation of abscess. Cx growing S Aureus.  -will need bedside swallow to advance diet -had CT aspiration of psoas abscess today -ID recs: ordered MRI which confirmed multiple diffusion changes, likely septic emboli. No clear benefit of antithrombotic therapy in this situation, TEE pending, changed to nafcillin for CNS penetration. R frontoparietal area likely contributing to LLE weakness -drain put out 90cc in last shift, potentially can d/c tomorrow -d/c foley, pt making good UOP ~0.9cc/kg/hr -rpt labs in AM given bacteremia with emboli, baseline CRP for following infection  Judith Part  07/26/19 6:35 PM

## 2019-07-27 ENCOUNTER — Other Ambulatory Visit (HOSPITAL_COMMUNITY): Payer: BC Managed Care – PPO

## 2019-07-27 ENCOUNTER — Inpatient Hospital Stay (HOSPITAL_COMMUNITY): Payer: BC Managed Care – PPO

## 2019-07-27 DIAGNOSIS — R7881 Bacteremia: Secondary | ICD-10-CM

## 2019-07-27 DIAGNOSIS — Z978 Presence of other specified devices: Secondary | ICD-10-CM

## 2019-07-27 DIAGNOSIS — K6812 Psoas muscle abscess: Secondary | ICD-10-CM

## 2019-07-27 LAB — CBC
HCT: 34.3 % — ABNORMAL LOW (ref 39.0–52.0)
Hemoglobin: 11.4 g/dL — ABNORMAL LOW (ref 13.0–17.0)
MCH: 32.9 pg (ref 26.0–34.0)
MCHC: 33.2 g/dL (ref 30.0–36.0)
MCV: 98.8 fL (ref 80.0–100.0)
Platelets: 366 10*3/uL (ref 150–400)
RBC: 3.47 MIL/uL — ABNORMAL LOW (ref 4.22–5.81)
RDW: 13.4 % (ref 11.5–15.5)
WBC: 15.5 10*3/uL — ABNORMAL HIGH (ref 4.0–10.5)
nRBC: 0 % (ref 0.0–0.2)

## 2019-07-27 LAB — URINE CULTURE: Culture: >=100000 — AB

## 2019-07-27 LAB — CULTURE, BLOOD (ROUTINE X 2): Special Requests: ADEQUATE

## 2019-07-27 LAB — C-REACTIVE PROTEIN: CRP: 20.6 mg/dL — ABNORMAL HIGH (ref <1.0)

## 2019-07-27 LAB — ECHOCARDIOGRAM COMPLETE
Height: 73 in
Weight: 3760 oz

## 2019-07-27 MED ORDER — PANTOPRAZOLE SODIUM 40 MG PO TBEC
40.0000 mg | DELAYED_RELEASE_TABLET | Freq: Every day | ORAL | Status: DC
  Administered 2019-07-27 – 2019-07-30 (×4): 40 mg via ORAL
  Filled 2019-07-27 (×4): qty 1

## 2019-07-27 NOTE — Social Work (Signed)
CSW acknowledging consult for SNF placement/HH services. Will follow for therapy recommendations needed to best determine disposition/for insurance authorization- at current time recommendation for CIR.   Westley Hummer, MSW, [place] [facility]

## 2019-07-27 NOTE — Anesthesia Postprocedure Evaluation (Signed)
Anesthesia Post Note  Patient: [name]  Procedure(s) Performed: Decompressive Lumbar Laminectomy Lumbar three-four Lumbar four-five for Epidural Abscess (N/A Back)     Patient location during evaluation: PACU Anesthesia Type: General Level of consciousness: awake and alert Pain management: pain level controlled Vital Signs Assessment: post-procedure vital signs reviewed and stable Respiratory status: spontaneous breathing, nonlabored ventilation and respiratory function stable Cardiovascular status: blood pressure returned to baseline and stable Postop Assessment: no apparent nausea or vomiting Anesthetic complications: no    Last Vitals:  Vitals:   07/27/19 0400 07/27/19 0800  BP: 128/81 (!) 110/58  Pulse: 80 67  Resp: (!) 25 19  Temp: (!) 38.3 C 36.9 C  SpO2: 98% 95%    Last Pain:  Vitals:   07/27/19 0800  TempSrc: Oral  PainSc:                  Audry Pili

## 2019-07-27 NOTE — Progress Notes (Signed)
BP (!) 142/82 (BP Location: Right Arm)   Pulse 95   Temp 98.1 F (36.7 C) (Oral)   Resp 13   Ht 6\' 1"  (1.854 m)   Wt 106.6 kg   SpO2 98%   BMI 31.00 kg/m  Alert and oriented x 4 Speech is clear, fluent Following commands Wound is clean and dry. Appreciate ID help

## 2019-07-27 NOTE — Progress Notes (Signed)
Physical Therapy Treatment Patient Details Name: [name] MRN: [id number] DOB: [birth year] Today's Date: 07/27/2019    History of Present Illness Pt is a 54 yo male s/p acute osteomyelitis s/p abcess of psoas muscle with AMS and weakness.  Pt in OR for decompressive lumbar laminectomies L3-4 L4-5 (for evacuation of epidural abscess) with partial medial facetectomies and foraminotomies of the L3-L4 and L5 nerve roots 07/25/2019 by Dr. Saintclair Halsted. Bilateral psoas abscesses as well, with R psoas drained by IR. PMHx:b/l THAs, obesity, alcohol abuse.    PT Comments    Pt with much improved cognitive status this session, understands where he is and his hospital course thus far. PT introduced pt to back precautions, which pt followed well with multimodal cuing. Pt required max assist +2 for bed mobility and transfer OOB to recliner and BSC, pt limited by LE weakness, back pain, and fecal incontinence. Per pt and pt's daughter, pt has assist from his daughter, ex-wife, and other children, but typically pt lives alone. PT stressed the importance of 24/7 assist at time of d/c, pt and pt's daughter state they will work on it. PT continuing to recommend CIR.    Follow Up Recommendations  CIR     Equipment Recommendations  Other (comment)(defer to next venue)    Recommendations for Other Services       Precautions / Restrictions Precautions Precautions: Back;Fall Precaution Comments: PT verbally reviewed no bending, lifting, twisting spine s/p back surgery. Log roll technique utilized Restrictions Weight Bearing Restrictions: No    Mobility  Bed Mobility Overal bed mobility: Needs Assistance Bed Mobility: Rolling;Sidelying to Sit Rolling: Max assist Sidelying to sit: Max assist       General bed mobility comments: max assist for log roll to R, trunk elevation and LE lifting to come to sitting EOB. Pt also required scoot assist to EOB.  Transfers Overall transfer level: Needs  assistance Equipment used: Rolling walker (2 wheeled) Transfers: Sit to/from [facility] Sit to Stand: Max assist;+2 physical assistance;+2 safety/equipment;From elevated surface Stand pivot transfers: Max assist;+2 physical assistance;+2 safety/equipment;From elevated surface       General transfer comment: max assist +2 for sit to stand for power up, hip extension, and steadying. Pt with posterior leaning, requiring verbal and tactile cuing to correct. Additional verbal cuing for bringing feet under BOS, weight shifting L and R.  Upon first stand, pt reports he needed to have a BM, so pt returned to sit on EOB while PT got BSC. Sit to stand x3 total, with bowel incotinence noted on stand pivot transfer to [facility].  Ambulation/Gait             General Gait Details: unable   Stairs             Wheelchair Mobility    Modified Rankin (Stroke Patients Only)       Balance Overall balance assessment: Needs assistance Sitting-balance support: Bilateral upper extremity supported;Feet supported Sitting balance-Leahy Scale: Fair Sitting balance - Comments: able to sit EOB without PT support ~1 minute, posterior leaning with fatigue Postural control: Posterior lean Standing balance support: Bilateral upper extremity supported;During functional activity Standing balance-Leahy Scale: Poor Standing balance comment: reliant on external assist                            Cognition Arousal/Alertness: Awake/alert Behavior During Therapy: Flat affect Overall Cognitive Status: Within Functional Limits for tasks assessed  General Comments: Pt following commands well, is oriented to time, self, situation      Exercises      General Comments General comments (skin integrity, edema, etc.): Pt's daughter present in room, assisting with pt care as pt's daughter is NT and insisted on assisting      Pertinent  Vitals/Pain Pain Assessment: Faces Faces Pain Scale: Hurts even more Pain Location: back Pain Descriptors / Indicators: Discomfort;Grimacing;Guarding;Sore Pain Intervention(s): Limited activity within patient's tolerance;Monitored during session;Repositioned    Home Living                      Prior Function            PT Goals (current goals can now be found in the care plan section) Acute Rehab PT Goals Patient Stated Goal: none stated PT Goal Formulation: With patient Time For Goal Achievement: 08/08/19 Potential to Achieve Goals: Fair Progress towards PT goals: Progressing toward goals    Frequency    Min 3X/week      PT Plan Current plan remains appropriate    Co-evaluation              AM-PAC PT "6 Clicks" Mobility   Outcome Measure  Help needed turning from your back to your side while in a flat bed without using bedrails?: A Lot Help needed moving from lying on your back to sitting on the side of a flat bed without using bedrails?: Total Help needed moving to and from a bed to a chair (including a wheelchair)?: Total Help needed standing up from a chair using your arms (e.g., wheelchair or bedside chair)?: Total Help needed to walk in hospital room?: Total Help needed climbing 3-5 steps with a railing? : Total 6 Click Score: 7    End of Session Equipment Utilized During Treatment: Gait belt Activity Tolerance: Patient limited by fatigue;Patient limited by pain Patient left: with call bell/phone within reach;in chair;with chair alarm set;with family/visitor present Nurse Communication: Mobility status;Other (comment);Need for lift equipment(will need stedy for return to bed vs +2 for SPT, drain needs to be retaped, bleeding from gluteal abscess) PT Visit Diagnosis: Other abnormalities of gait and mobility (R26.89);Difficulty in walking, not elsewhere classified (R26.2);Muscle weakness (generalized) (M62.81)     Time: [id number] PT Time  Calculation (min) (ACUTE ONLY): 49 min  Charges:  $Therapeutic Activity: 23-37 mins                     Jibril Mcminn E, PT Acute Rehabilitation Services Pager [phone]  Office [phone]    Ardis Lawley D Mase Dhondt 07/27/2019, 2:50 PM

## 2019-07-27 NOTE — Progress Notes (Signed)
  Echocardiogram 2D Echocardiogram has been performed.  [name] 07/27/2019, 3:34 PM

## 2019-07-27 NOTE — Progress Notes (Signed)
Patient has had 3 in and out urinary catheters and continues to be unable to void. Bladder scan greater than 999cc. 34fr foley catheter inserted using sterile technique with assist from [employer]. Patient tolerated well. 1000cc clear yellow urine with scant sediment returned immediatly. Abscess on buttocks with serosanguneous drainage-dressing changed.

## 2019-07-27 NOTE — Progress Notes (Signed)
Subjective: He feels much better and feels some of his strength is returning after surgery   Antibiotics:  Anti-infectives (From admission, onward)   Start     Dose/Rate Route Frequency Ordered Stop   07/26/19 1200  nafcillin 2 g in sodium chloride 0.9 % 100 mL IVPB     2 g 200 mL/hr over 30 Minutes Intravenous Every 4 hours 07/26/19 0945     07/26/19 0945  nafcillin injection 2 g  Status:  Discontinued     2 g Intravenous Every 4 hours 07/26/19 0943 07/26/19 0944   07/25/19 1400  ceFAZolin (ANCEF) IVPB 2g/100 mL premix  Status:  Discontinued     2 g 200 mL/hr over 30 Minutes Intravenous Every 8 hours 07/25/19 0929 07/26/19 0943   07/25/19 0400  ceFAZolin (ANCEF) IVPB 2g/100 mL premix  Status:  Discontinued     2 g 200 mL/hr over 30 Minutes Intravenous Every 8 hours 07/25/19 0352 07/25/19 0929   07/25/19 0400  vancomycin (VANCOREADY) IVPB 1250 mg/250 mL  Status:  Discontinued     1,250 mg 166.7 mL/hr over 90 Minutes Intravenous Every 12 hours 07/25/19 0254 07/25/19 0254   07/25/19 0400  vancomycin (VANCOREADY) IVPB 1250 mg/250 mL  Status:  Discontinued     1,250 mg 166.7 mL/hr over 90 Minutes Intravenous Every 12 hours 07/25/19 0254 07/25/19 0929   07/25/19 0138  bacitracin 50,000 Units in sodium chloride 0.9 % 500 mL irrigation  Status:  Discontinued       As needed 07/25/19 0139 07/25/19 0242   07/24/19 1700  ceFEPIme (MAXIPIME) 2 g in sodium chloride 0.9 % 100 mL IVPB     2 g 200 mL/hr over 30 Minutes Intravenous  Once 07/24/19 1647 07/24/19 2017   07/24/19 1700  metroNIDAZOLE (FLAGYL) IVPB 500 mg     500 mg 100 mL/hr over 60 Minutes Intravenous  Once 07/24/19 1647 07/24/19 2159   07/24/19 1700  vancomycin (VANCOCIN) IVPB 1000 mg/200 mL premix     1,000 mg 200 mL/hr over 60 Minutes Intravenous  Once 07/24/19 1647 07/24/19 2159      Medications: Scheduled Meds: . Chlorhexidine Gluconate Cloth  6 each Topical Daily  . LORazepam  0-4 mg Intravenous Q12H   Or    . LORazepam  0-4 mg Oral Q12H  . pantoprazole  40 mg Oral QHS  . predniSONE  15 mg Oral Q breakfast  . sodium chloride flush  10-40 mL Intracatheter Q12H  . sodium chloride flush  3 mL Intravenous Q12H  . thiamine  100 mg Oral Daily   Continuous Infusions: . sodium chloride 250 mL (07/26/19 2124)  . nafcillin IV 2 g (07/27/19 1122)   PRN Meds:.acetaminophen **OR** acetaminophen, alum & mag hydroxide-simeth, cyclobenzaprine, HYDROmorphone (DILAUDID) injection, ibuprofen, menthol-cetylpyridinium **OR** phenol, ondansetron **OR** ondansetron (ZOFRAN) IV, oxyCODONE, sodium chloride flush, sodium chloride flush    Objective: Weight change:   Intake/Output Summary (Last 24 hours) at 07/27/2019 1214 Last data filed at 07/27/2019 0630 Gross per 24 hour  Intake 1776.1 ml  Output 2360 ml  Net -583.9 ml   Blood pressure (!) 110/58, pulse 67, temperature 98.5 F (36.9 C), temperature source Oral, resp. rate 19, height 6\' 1"  (1.854 m), weight 106.6 kg, SpO2 95 %. Temp:  [98.5 F (36.9 C)-100.9 F (38.3 C)] 98.5 F (36.9 C) (01/01 0800) Pulse Rate:  [67-94] 67 (01/01 0800) Resp:  [19-25] 19 (01/01 0800) BP: (110-136)/(58-97) 110/58 (01/01 0800) SpO2:  [95 %-  99 %] 95 % (01/01 0800)  Physical Exam: General: Alert and awake, oriented x3, not in any acute distress. HEENT: anicteric sclera, EOMI CVS regular rate, normal no murmurs gallops rubs heard Chest: , no wheezing, no respiratory distress clear to auscultation anteriorly Abdomen: soft non-distended,  Extremities: no edema or deformity noted bilaterally Skin: no rashes, drain in place Neuro: Is able to move all of his extremities  CBC:    BMET Recent Labs    07/24/19 1510 07/26/19 1905  NA 128* 136  K 3.4* 4.2  CL 90* 100  CO2 25 27  GLUCOSE 105* 106*  BUN 20 19  CREATININE 0.75 0.87  CALCIUM 9.1 8.6*     Liver Panel  Recent Labs    07/24/19 1510 07/26/19 1905  PROT 8.6* 7.0  ALBUMIN 2.7* 1.8*  AST 34 25   ALT 49* 27  ALKPHOS 122 92  BILITOT 1.2 1.0       Sedimentation Rate Recent Labs    07/24/19 1510  ESRSEDRATE 114*   C-Reactive Protein Recent Labs    07/27/19 0336  CRP 20.6*    Micro Results: Recent Results (from the past 720 hour(s))  Urine culture     Status: Abnormal   Collection Time: 07/24/19  2:57 PM   Specimen: Urine, Random  Result Value Ref Range Status   Specimen Description   Final    URINE, RANDOM Performed at [facility], [place] [address] [place], [place] [address]    Special Requests   Final    NONE Performed at [facility], [place] [address], [place], [place] [address]    Culture >=100,000 COLONIES/mL STAPHYLOCOCCUS AUREUS (A)  Final   Report Status 07/27/2019 FINAL  Final   Organism ID, Bacteria STAPHYLOCOCCUS AUREUS (A)  Final      Susceptibility   Staphylococcus aureus - MIC*    CIPROFLOXACIN >=8 RESISTANT Resistant     GENTAMICIN <=0.5 SENSITIVE Sensitive     NITROFURANTOIN <=16 SENSITIVE Sensitive     OXACILLIN 0.5 SENSITIVE Sensitive     TETRACYCLINE <=1 SENSITIVE Sensitive     VANCOMYCIN <=0.5 SENSITIVE Sensitive     TRIMETH/SULFA <=10 SENSITIVE Sensitive     CLINDAMYCIN <=0.25 SENSITIVE Sensitive     RIFAMPIN <=0.5 SENSITIVE Sensitive     Inducible Clindamycin NEGATIVE Sensitive     * >=100,000 COLONIES/mL STAPHYLOCOCCUS AUREUS  Culture, blood (routine x 2)     Status: Abnormal   Collection Time: 07/24/19  3:10 PM   Specimen: BLOOD RIGHT HAND  Result Value Ref Range Status   Specimen Description   Final    BLOOD RIGHT HAND Performed at [place] [address], [place], [place] [address]    Special Requests   Final    BOTTLES DRAWN AEROBIC ONLY Blood Culture results may not be optimal due to an inadequate volume of blood received in culture bottles Performed at [place] [address], [place], [place] [address]    Culture  Setup Time   Final     AEROBIC BOTTLE ONLY GRAM POSITIVE COCCI CRITICAL VALUE NOTED.  VALUE IS CONSISTENT WITH PREVIOUSLY REPORTED AND CALLED VALUE.    Culture (A)  Final    STAPHYLOCOCCUS AUREUS SUSCEPTIBILITIES PERFORMED ON PREVIOUS CULTURE WITHIN THE LAST 5 DAYS. Performed at [facility], [place] [address] [place], [place] [address]    Report Status 07/27/2019 FINAL  Final  Culture, blood (routine x 2)  Status: Abnormal   Collection Time: 07/24/19  3:10 PM   Specimen: BLOOD  Result Value Ref Range Status   Specimen Description   Final    BLOOD LEFT ANTECUBITAL Performed at [facility] [address] [place], [place] [address]    Special Requests   Final    BOTTLES DRAWN AEROBIC AND ANAEROBIC Blood Culture adequate volume Performed at [facility] [address] [place], [place] [address]    Culture  Setup Time   Final    GRAM POSITIVE COCCI IN BOTH AEROBIC AND ANAEROBIC BOTTLES CRITICAL RESULT CALLED TO, READ BACK BY AND VERIFIED WITH: Andres Shad PharmD 9:00 07/25/19 ([email or web address]) Performed at [facility], [place] [address] [place], [place] [address]    Culture STAPHYLOCOCCUS AUREUS (A)  Final   Report Status 07/27/2019 FINAL  Final   Organism ID, Bacteria STAPHYLOCOCCUS AUREUS  Final      Susceptibility   Staphylococcus aureus - MIC*    CIPROFLOXACIN >=8 RESISTANT Resistant     ERYTHROMYCIN >=8 RESISTANT Resistant     GENTAMICIN <=0.5 SENSITIVE Sensitive     OXACILLIN 0.5 SENSITIVE Sensitive     TETRACYCLINE <=1 SENSITIVE Sensitive     VANCOMYCIN 1 SENSITIVE Sensitive     TRIMETH/SULFA <=10 SENSITIVE Sensitive     CLINDAMYCIN <=0.25 SENSITIVE Sensitive     RIFAMPIN <=0.5 SENSITIVE Sensitive     Inducible Clindamycin NEGATIVE Sensitive     * STAPHYLOCOCCUS AUREUS  Blood Culture ID Panel (Reflexed)     Status: Abnormal   Collection Time: 07/24/19  3:10 PM  Result Value Ref Range Status   Enterococcus species NOT DETECTED NOT DETECTED Final    Listeria monocytogenes NOT DETECTED NOT DETECTED Final   Staphylococcus species DETECTED (A) NOT DETECTED Final    Comment: CRITICAL RESULT CALLED TO, READ BACK BY AND VERIFIED WITH: Andres Shad PharmD 9:00 07/25/19 ([email or web address])    Staphylococcus aureus (BCID) DETECTED (A) NOT DETECTED Final    Comment: Methicillin (oxacillin) susceptible Staphylococcus aureus (MSSA). Preferred therapy is anti staphylococcal beta lactam antibiotic (Cefazolin or Nafcillin), unless clinically contraindicated. CRITICAL RESULT CALLED TO, READ BACK BY AND VERIFIED WITH: Andres Shad PharmD 9:00 07/25/19 ([email or web address])    Methicillin resistance NOT DETECTED NOT DETECTED Final   Streptococcus species NOT DETECTED NOT DETECTED Final   Streptococcus agalactiae NOT DETECTED NOT DETECTED Final   Streptococcus pneumoniae NOT DETECTED NOT DETECTED Final   Streptococcus pyogenes NOT DETECTED NOT DETECTED Final   Acinetobacter baumannii NOT DETECTED NOT DETECTED Final   Enterobacteriaceae species NOT DETECTED NOT DETECTED Final   Enterobacter cloacae complex NOT DETECTED NOT DETECTED Final   Escherichia coli NOT DETECTED NOT DETECTED Final   Klebsiella oxytoca NOT DETECTED NOT DETECTED Final   Klebsiella pneumoniae NOT DETECTED NOT DETECTED Final   Proteus species NOT DETECTED NOT DETECTED Final   Serratia marcescens NOT DETECTED NOT DETECTED Final   Haemophilus influenzae NOT DETECTED NOT DETECTED Final   Neisseria meningitidis NOT DETECTED NOT DETECTED Final   Pseudomonas aeruginosa NOT DETECTED NOT DETECTED Final   Candida albicans NOT DETECTED NOT DETECTED Final   Candida glabrata NOT DETECTED NOT DETECTED Final   Candida krusei NOT DETECTED NOT DETECTED Final   Candida parapsilosis NOT DETECTED NOT DETECTED Final   Candida tropicalis NOT DETECTED NOT DETECTED Final    Comment: Performed at [facility], [address], [place], [place] [address]  Respiratory Panel by RT PCR (Flu A&B, Covid) - Nasopharyngeal Swab  Status: None   Collection Time: 07/24/19  8:34 PM   Specimen: Nasopharyngeal Swab  Result Value Ref Range Status   SARS Coronavirus 2 by RT PCR NEGATIVE NEGATIVE Final    Comment: (NOTE) SARS-CoV-2 target nucleic acids are NOT DETECTED. The SARS-CoV-2 RNA is generally detectable in upper respiratoy specimens during the acute phase of infection. The lowest concentration of SARS-CoV-2 viral copies this assay can detect is 131 copies/mL. A negative result does not preclude SARS-Cov-2 infection and should not be used as the sole basis for treatment or other patient management decisions. A negative result may occur with  improper specimen collection/handling, submission of specimen other than nasopharyngeal swab, presence of viral mutation(s) within the areas targeted by this assay, and inadequate number of viral copies (<131 copies/mL). A negative result must be combined with clinical observations, patient history, and epidemiological information. The expected result is Negative. Fact Sheet for Patients:  PinkCheek.be Fact Sheet for Healthcare Providers:  GravelBags.it This test is not yet ap proved or cleared by the [place] FDA and  has been authorized for detection and/or diagnosis of SARS-CoV-2 by FDA under an Emergency Use Authorization (EUA). This EUA will remain  in effect (meaning this test can be used) for the duration of the COVID-19 declaration under Section 564(b)(1) of the Act, 21 U.S.C. section 360bbb-3(b)(1), unless the authorization is terminated or revoked sooner.    Influenza A by PCR NEGATIVE NEGATIVE Final   Influenza B by PCR NEGATIVE NEGATIVE Final    Comment: (NOTE) The Xpert Xpress SARS-CoV-2/FLU/RSV assay is intended as an aid in  the diagnosis of influenza from Nasopharyngeal swab specimens and  should not be used as a sole basis for treatment. Nasal washings and  aspirates are unacceptable for  Xpert Xpress SARS-CoV-2/FLU/RSV  testing. Fact Sheet for Patients: PinkCheek.be Fact Sheet for Healthcare Providers: GravelBags.it This test is not yet approved or cleared by the [place] FDA and  has been authorized for detection and/or diagnosis of SARS-CoV-2 by  FDA under an Emergency Use Authorization (EUA). This EUA will remain  in effect (meaning this test can be used) for the duration of the  Covid-19 declaration under Section 564(b)(1) of the Act, 21  U.S.C. section 360bbb-3(b)(1), unless the authorization is  terminated or revoked. Performed at [facility], [place] [address] [place], [place] [address]   Aerobic/Anaerobic Culture (surgical/deep wound)     Status: None (Preliminary result)   Collection Time: 07/25/19  1:43 AM   Specimen: Abscess  Result Value Ref Range Status   Specimen Description ABSCESS  Final   Special Requests EPIDURAL SAMPLE A  Final   Gram Stain   Final    ABUNDANT WBC PRESENT, PREDOMINANTLY PMN ABUNDANT GRAM POSITIVE COCCI Performed at [facility], [place] [address] [place], [place] [address]    Culture   Final    MODERATE STAPHYLOCOCCUS AUREUS NO ANAEROBES ISOLATED; CULTURE IN PROGRESS FOR 5 DAYS    Report Status PENDING  Incomplete   Organism ID, Bacteria STAPHYLOCOCCUS AUREUS  Final      Susceptibility   Staphylococcus aureus - MIC*    CIPROFLOXACIN >=8 RESISTANT Resistant     ERYTHROMYCIN >=8 RESISTANT Resistant     GENTAMICIN <=0.5 SENSITIVE Sensitive     OXACILLIN 0.5 SENSITIVE Sensitive     TETRACYCLINE <=1 SENSITIVE Sensitive     VANCOMYCIN <=0.5 SENSITIVE Sensitive     TRIMETH/SULFA <=10 SENSITIVE Sensitive     CLINDAMYCIN <=0.25 SENSITIVE Sensitive  RIFAMPIN <=0.5 SENSITIVE Sensitive     Inducible Clindamycin NEGATIVE Sensitive     * MODERATE STAPHYLOCOCCUS AUREUS  Aerobic/Anaerobic Culture (surgical/deep wound)     Status: None (Preliminary  result)   Collection Time: 07/25/19  1:46 AM   Specimen: Abscess  Result Value Ref Range Status   Specimen Description ABSCESS  Final   Special Requests EPIDURAL SAMPLE B  Final   Gram Stain   Final    RARE WBC PRESENT, PREDOMINANTLY PMN NO ORGANISMS SEEN Performed at [facility], [place] [address], [place], [place] [address]    Culture   Final    FEW STAPHYLOCOCCUS AUREUS NO ANAEROBES ISOLATED; CULTURE IN PROGRESS FOR 5 DAYS    Report Status PENDING  Incomplete   Organism ID, Bacteria STAPHYLOCOCCUS AUREUS  Final      Susceptibility   Staphylococcus aureus - MIC*    CIPROFLOXACIN >=8 RESISTANT Resistant     ERYTHROMYCIN >=8 RESISTANT Resistant     GENTAMICIN <=0.5 SENSITIVE Sensitive     OXACILLIN 0.5 SENSITIVE Sensitive     TETRACYCLINE <=1 SENSITIVE Sensitive     VANCOMYCIN 1 SENSITIVE Sensitive     TRIMETH/SULFA <=10 SENSITIVE Sensitive     CLINDAMYCIN <=0.25 SENSITIVE Sensitive     RIFAMPIN <=0.5 SENSITIVE Sensitive     Inducible Clindamycin NEGATIVE Sensitive     * FEW STAPHYLOCOCCUS AUREUS  Aerobic/Anaerobic Culture (surgical/deep wound)     Status: None (Preliminary result)   Collection Time: 07/25/19  1:58 AM   Specimen: Abscess  Result Value Ref Range Status   Specimen Description ABSCESS  Final   Special Requests EPIDURAL SAMPLE C  Final   Gram Stain   Final    ABUNDANT WBC PRESENT, PREDOMINANTLY PMN ABUNDANT GRAM POSITIVE COCCI Performed at [facility], [address], [place], [place] [address]    Culture   Final    MODERATE STAPHYLOCOCCUS AUREUS NO ANAEROBES ISOLATED; CULTURE IN PROGRESS FOR 5 DAYS    Report Status PENDING  Incomplete   Organism ID, Bacteria STAPHYLOCOCCUS AUREUS  Final      Susceptibility   Staphylococcus aureus - MIC*    CIPROFLOXACIN >=8 RESISTANT Resistant     ERYTHROMYCIN >=8 RESISTANT Resistant     GENTAMICIN <=0.5 SENSITIVE Sensitive     OXACILLIN 0.5 SENSITIVE Sensitive     TETRACYCLINE <=1 SENSITIVE Sensitive      VANCOMYCIN <=0.5 SENSITIVE Sensitive     TRIMETH/SULFA <=10 SENSITIVE Sensitive     CLINDAMYCIN <=0.25 SENSITIVE Sensitive     RIFAMPIN <=0.5 SENSITIVE Sensitive     Inducible Clindamycin NEGATIVE Sensitive     * MODERATE STAPHYLOCOCCUS AUREUS  Aerobic/Anaerobic Culture (surgical/deep wound)     Status: None (Preliminary result)   Collection Time: 07/25/19  1:26 PM   Specimen: Abscess  Result Value Ref Range Status   Specimen Description ABSCESS  Final   Special Requests Normal  Final   Gram Stain   Final    FEW WBC PRESENT, PREDOMINANTLY PMN MODERATE GRAM POSITIVE COCCI Performed at [facility], [address] [place], [place] [address]    Culture MODERATE STAPHYLOCOCCUS AUREUS  Final   Report Status PENDING  Incomplete   Organism ID, Bacteria STAPHYLOCOCCUS AUREUS  Final      Susceptibility   Staphylococcus aureus - MIC*    CIPROFLOXACIN >=8 RESISTANT Resistant     ERYTHROMYCIN >=8 RESISTANT Resistant     GENTAMICIN <=0.5 SENSITIVE Sensitive     OXACILLIN 0.5 SENSITIVE Sensitive  TETRACYCLINE <=1 SENSITIVE Sensitive     VANCOMYCIN <=0.5 SENSITIVE Sensitive     TRIMETH/SULFA <=10 SENSITIVE Sensitive     CLINDAMYCIN <=0.25 SENSITIVE Sensitive     RIFAMPIN <=0.5 SENSITIVE Sensitive     Inducible Clindamycin NEGATIVE Sensitive     * MODERATE STAPHYLOCOCCUS AUREUS    Studies/Results: MR BRAIN W WO CONTRAST  Result Date: 07/26/2019 CLINICAL DATA:  Bacteremia.  Endocarditis.  L3-4 spinal infection. EXAM: MRI HEAD WITHOUT AND WITH CONTRAST TECHNIQUE: Multiplanar, multiecho pulse sequences of the brain and surrounding structures were obtained without and with intravenous contrast. CONTRAST:  73mL GADAVIST GADOBUTROL 1 MMOL/ML IV SOLN COMPARISON:  None. FINDINGS: Brain: Diffusion imaging shows a 16 mm acute infarction in the peripheral right cerebellum. No hemorrhage or contrast enhancement in this location. Cerebral hemispheres show a punctate acute infarction at the right  frontoparietal vertex. No other acute finding. The brainstem and cerebellum are otherwise normal. Cerebral hemispheres elsewhere are normal without evidence of old infarctions. No mass, hemorrhage, hydrocephalus or extra-axial collection. No abnormal brain or leptomeningeal enhancement. Vascular: Major vessels at the base of the brain show flow. Skull and upper cervical spine: Negative Sinuses/Orbits: Clear/normal Other: None IMPRESSION: 16 mm acute infarction in the peripheral right cerebellum. Punctate acute infarction at the right frontoparietal vertex. Differing vascular territories raise the possibility of embolic disease from the heart or ascending aorta. There is no evidence of hemorrhage or contrast enhancement that would allow me to make the specific diagnosis of septic emboli, but that is obviously the concern given the clinical history. Electronically Signed   By: Nelson Chimes M.D.   On: 07/26/2019 15:38   CT ASPIRATION  Result Date: 07/25/2019 INDICATION: Lumbar spondylo discitis and retroperitoneal right psoas abscess EXAM: CT NEEDLE ASPIRATION RIGHT PSOAS ABSCESS MEDICATIONS: The patient is currently admitted to the hospital and receiving intravenous antibiotics. The antibiotics were administered within an appropriate time frame prior to the initiation of the procedure. ANESTHESIA/SEDATION: Fentanyl 100 mcg IV; Versed 2.0 mg IV Moderate Sedation Time:  10 MINUTES The patient was continuously monitored during the procedure by the interventional radiology nurse under my direct supervision. COMPLICATIONS: None immediate. PROCEDURE: Informed written consent was obtained from the patient after a thorough discussion of the procedural risks, benefits and alternatives. All questions were addressed. Maximal Sterile Barrier Technique was utilized including caps, mask, sterile gowns, sterile gloves, sterile drape, hand hygiene and skin antiseptic. A timeout was performed prior to the initiation of the  procedure. Previous imaging reviewed. Patient position prone. Noncontrast localization CT performed. The right psoas muscle fluid collection was localized and marked for a posterior approach. Under sterile conditions and local anesthesia, a 17 gauge 11.8 cm access was advanced from a posterior paraspinous approach into the fluid collection. Needle position confirmed with CT. Syringe aspiration yielded 30 cc purulent fluid. Sample sent for culture. Needle removed. Postprocedure imaging demonstrates no hemorrhage or hematoma. Patient tolerated the procedure well. IMPRESSION: Successful CT-guided needle aspiration of the right psoas muscle paraspinous abscess. Electronically Signed   By: Jerilynn Mages.  Shick M.D.   On: 07/25/2019 15:09      Assessment/Plan:  INTERVAL HISTORY:   MRI did show evidence of a septic embolism   Principal Problem:   Epidural abscess, L2-L5 Active Problems:   S/P lumbar laminectomy    1. [name] is a 54 y.o. male with  complicated MSSA bacteremia Lumbar epidural abscess cauda equina syndrome severe spinal stenosis L3-4 L4-5 sp Decompressive lumbar laminectomies L3-4 L4-5 for evacuation of  epidural abscess with partial medial facetectomies and foraminotomies of the L3-L4 and L5 nerve rootsPsoas abscess sp IR guided aspiration. He has evidence of a septic embolism on MRI of the brain.  His repeat blood cultures are persistently positive  --- Repeat blood cultures -- DO NOT PLACE CENTRAL LINE --I will order TTE if not done so --continue Nafcillin  Dr Linus Salmons is available for questions this weekend and will follow up on the culture data.  [name] will take over the service on Monday.      LOS: 2 days   Alcide Evener 07/27/2019, 12:14 PM

## 2019-07-28 NOTE — Progress Notes (Signed)
NEUROSURGERY PROGRESS NOTE  Doing ok today. Seems pretty down today and frustrated. Able to lift legs slightly off of bed briefly, good dorsiflexion. Sat on side of bed with therapy yesterday and did well. Continue therapy today. Appreciate ID's help.   Temp:  [98.1 F (36.7 C)-99.4 F (37.4 C)] 98.9 F (37.2 C) (01/02 0800) Pulse Rate:  [83-95] 95 (01/02 0409) Resp:  [13-21] 14 (01/02 0409) BP: (120-142)/(73-82) 120/73 (01/02 0409) SpO2:  [95 %-98 %] 95 % (01/02 0409) Weight:  [109.9 kg] 109.9 kg (01/01 2324)   Eleonore Chiquito, NP 07/28/2019 9:59 AM

## 2019-07-29 LAB — BASIC METABOLIC PANEL
Anion gap: 9 (ref 5–15)
BUN: 10 mg/dL (ref 6–20)
CO2: 25 mmol/L (ref 22–32)
Calcium: 8.1 mg/dL — ABNORMAL LOW (ref 8.9–10.3)
Chloride: 103 mmol/L (ref 98–111)
Creatinine, Ser: 0.76 mg/dL (ref 0.61–1.24)
GFR calc Af Amer: >60 mL/min (ref >60)
GFR calc non Af Amer: >60 mL/min (ref >60)
Glucose, Bld: 93 mg/dL (ref 70–99)
Potassium: 3 mmol/L — ABNORMAL LOW (ref 3.5–5.1)
Sodium: 137 mmol/L (ref 135–145)

## 2019-07-29 LAB — CBC
HCT: 34.2 % — ABNORMAL LOW (ref 39.0–52.0)
Hemoglobin: 11.1 g/dL — ABNORMAL LOW (ref 13.0–17.0)
MCH: 32.3 pg (ref 26.0–34.0)
MCHC: 32.5 g/dL (ref 30.0–36.0)
MCV: 99.4 fL (ref 80.0–100.0)
Platelets: 371 K/uL (ref 150–400)
RBC: 3.44 MIL/uL — ABNORMAL LOW (ref 4.22–5.81)
RDW: 13.4 % (ref 11.5–15.5)
WBC: 9.7 K/uL (ref 4.0–10.5)
nRBC: 0 % (ref 0.0–0.2)

## 2019-07-29 NOTE — Progress Notes (Signed)
NEUROSURGERY PROGRESS NOTE  Doing well. Complains of appropriate back soreness. Feels like his legs are improving a little more than preop.   Temp:  [98.4 F (36.9 C)-99.7 F (37.6 C)] 98.4 F (36.9 C) (01/03 0358) Pulse Rate:  [72-84] 81 (01/03 0810) Resp:  [13-20] 20 (01/03 0810) BP: (117-159)/(73-81) 159/81 (01/03 0810) SpO2:  [94 %-99 %] 97 % (01/03 0810)  Plan: Continue therapies and IV abx  Eleonore Chiquito, NP 07/29/2019 8:51 AM

## 2019-07-30 ENCOUNTER — Inpatient Hospital Stay: Payer: Self-pay

## 2019-07-30 DIAGNOSIS — R7881 Bacteremia: Secondary | ICD-10-CM | POA: Diagnosis present

## 2019-07-30 DIAGNOSIS — I669 Occlusion and stenosis of unspecified cerebral artery: Secondary | ICD-10-CM

## 2019-07-30 DIAGNOSIS — M4646 Discitis, unspecified, lumbar region: Secondary | ICD-10-CM | POA: Diagnosis present

## 2019-07-30 HISTORY — DX: Occlusion and stenosis of unspecified cerebral artery: I66.9

## 2019-07-30 LAB — AEROBIC/ANAEROBIC CULTURE W GRAM STAIN (SURGICAL/DEEP WOUND): Special Requests: NORMAL

## 2019-07-30 MED ORDER — SODIUM CHLORIDE 0.9% FLUSH: 10.0000 mL | INTRAVENOUS | Status: DC | PRN

## 2019-07-30 MED ORDER — SODIUM CHLORIDE 0.9% FLUSH: 10.0000 mL | Freq: Two times a day (BID) | INTRAVENOUS | Status: DC

## 2019-07-30 NOTE — Progress Notes (Signed)
Physical Therapy Treatment Patient Details Name: [name] MRN: [id number] DOB: 09/24/1965 Today's Date: 07/30/2019    History of Present Illness Pt is a 54 yo male s/p acute osteomyelitis s/p abcess of psoas muscle with AMS and weakness.  Pt in OR for decompressive lumbar laminectomies L3-4 L4-5 (for evacuation of epidural abscess) with partial medial facetectomies and foraminotomies of the L3-L4 and L5 nerve roots 07/25/2019 by Dr. Saintclair Halsted. Bilateral psoas abscesses as well, with R psoas drained by IR. PMHx:b/l THAs, obesity, alcohol abuse.    PT Comments    Pt more alert today however doesn't recall previous therapy sessions stating "I haven't been off my back since I've been here" however pt has been OOB to chair. Pt's mobility continues to be limited by pain. Pt continues to require maxA for bed mobility and transfer OOB to chair. Pt continues with bilat LE weakness R worse than left. Continue to recommend CIR upon d/c to maximize functional return.    Follow Up Recommendations  CIR     Equipment Recommendations  Other (comment)    Recommendations for Other Services       Precautions / Restrictions Precautions Precautions: Back;Fall Precaution Booklet Issued: Yes (comment) Precaution Comments: pt re-educated on precautions Restrictions Weight Bearing Restrictions: No    Mobility  Bed Mobility Overal bed mobility: Needs Assistance Bed Mobility: Rolling;Sidelying to Sit Rolling: Mod assist Sidelying to sit: Max assist;+2 for physical assistance       General bed mobility comments: max multimodal directional cues to complete task, modA for R LE mangement due to pain and weakness, maxAX2 for trunk elevation and LE management off bed. pt with increased pain however diminished upon sitting but remains constant per pt report  Transfers Overall transfer level: Needs assistance Equipment used: Rolling walker (2 wheeled) Transfers: Sit to/from [facility] Sit  to Stand: Mod assist;+2 physical assistance Stand pivot transfers: Mod assist;+2 physical assistance       General transfer comment: pt very dependent on UE support, pt able to advance L LE during std pvt transfer but unable to advance R LE requiring modA to clear foot from ground and then pt was able to advance it  Ambulation/Gait             General Gait Details: limited by pain to stand pvt transfer to chair   Stairs             Wheelchair Mobility    Modified Rankin (Stroke Patients Only)       Balance Overall balance assessment: Needs assistance Sitting-balance support: Bilateral upper extremity supported;Feet supported Sitting balance-Leahy Scale: Fair Sitting balance - Comments: able to sit EOB without PT support ~1 minute, posterior leaning with fatigue   Standing balance support: Bilateral upper extremity supported;During functional activity Standing balance-Leahy Scale: Poor Standing balance comment: reliant on external assist and RW                            Cognition Arousal/Alertness: Awake/alert Behavior During Therapy: Flat affect   Area of Impairment: Problem solving;Attention                     Memory: Decreased recall of precautions Following Commands: Follows one step commands with increased time;Follows one step commands inconsistently     Problem Solving: Slow processing;Decreased initiation;Difficulty sequencing;Requires verbal cues;Requires tactile cues General Comments: per OT pt much better cognitively today and more alert  Exercises      General Comments General comments (skin integrity, edema, etc.): Pt with dressing on back, minimal drainage.       Pertinent Vitals/Pain Pain Assessment: 0-10 Pain Score: 7  Pain Location: back Pain Descriptors / Indicators: Discomfort;Grimacing;Guarding;Sore Pain Intervention(s): Monitored during session    Home Living                      Prior  Function            PT Goals (current goals can now be found in the care plan section) Progress towards PT goals: Progressing toward goals    Frequency    Min 5X/week      PT Plan Frequency needs to be updated    Co-evaluation PT/OT/SLP Co-Evaluation/Treatment: Yes Reason for Co-Treatment: Complexity of the patient's impairments (multi-system involvement) PT goals addressed during session: Mobility/safety with mobility        AM-PAC PT "6 Clicks" Mobility   Outcome Measure  Help needed turning from your back to your side while in a flat bed without using bedrails?: A Lot Help needed moving from lying on your back to sitting on the side of a flat bed without using bedrails?: A Lot Help needed moving to and from a bed to a chair (including a wheelchair)?: A Lot Help needed standing up from a chair using your arms (e.g., wheelchair or bedside chair)?: A Lot Help needed to walk in hospital room?: Total Help needed climbing 3-5 steps with a railing? : Total 6 Click Score: 10    End of Session Equipment Utilized During Treatment: Gait belt Activity Tolerance: Patient limited by fatigue;Patient limited by pain Patient left: with call bell/phone within reach;in chair;with chair alarm set Nurse Communication: Mobility status;Other (comment);Need for lift equipment PT Visit Diagnosis: Other abnormalities of gait and mobility (R26.89);Difficulty in walking, not elsewhere classified (R26.2);Muscle weakness (generalized) (M62.81)     Time: [id number] PT Time Calculation (min) (ACUTE ONLY): 25 min  Charges:  $Gait Training: 8-22 mins                     Kittie Plater, PT, DPT Acute Rehabilitation Services Pager #: [phone] Office #: [phone]    Berline Lopes 07/30/2019, 10:20 AM

## 2019-07-30 NOTE — Progress Notes (Signed)
Patient ID: [name], male   DOB: [birth year], 53 y.o.   MRN: [id number]         [facility]  Date of Admission:  07/24/2019   Total days of antibiotics 7        Day 5 nafcillin         ASSESSMENT: He has MSSA bacteremia complicated by lumbar vertebral infection and possible CNS septic emboli.  He has no other evidence of endocarditis by exam or TTE.  Repeat blood cultures are negative at 72 hours.  Operative cultures also grew MSSA.  Plan on 6 weeks of IV nafcillin starting from 07/25/2019, the date of his lumbar surgery.  PLAN: 1. Continue nafcillin 2. PICC placement  Principal Problem:   Bacteremia due to methicillin susceptible Staphylococcus aureus (MSSA) Active Problems:   S/P lumbar laminectomy   Epidural abscess, L2-L5   Lumbar discitis   Cerebral septic emboli (HCC)   Scheduled Meds: . Chlorhexidine Gluconate Cloth  6 each Topical Daily  . pantoprazole  40 mg Oral QHS  . predniSONE  15 mg Oral Q breakfast  . sodium chloride flush  10-40 mL Intracatheter Q12H  . sodium chloride flush  3 mL Intravenous Q12H  . thiamine  100 mg Oral Daily   Continuous Infusions: . sodium chloride 250 mL (07/26/19 2124)  . nafcillin IV 2 g (07/30/19 0718)   PRN Meds:.acetaminophen **OR** acetaminophen, alum & mag hydroxide-simeth, cyclobenzaprine, HYDROmorphone (DILAUDID) injection, ibuprofen, menthol-cetylpyridinium **OR** phenol, ondansetron **OR** ondansetron (ZOFRAN) IV, oxyCODONE, sodium chloride flush, sodium chloride flush   SUBJECTIVE: He is feeling much better.  He says that he was sick for about 3 weeks before admission 1 week ago.  He denies having any back pain but tells me that his legs were "weak as water".  Review of Systems: Review of Systems  Constitutional: Negative for chills and fever.  Musculoskeletal: Negative for back pain.  Neurological: Positive for weakness.    Allergies  Allergen Reactions  . Bee Venom Anaphylaxis  .  Hydrocodone Nausea And Vomiting    OBJECTIVE: Vitals:   07/29/19 2000 07/29/19 2300 07/30/19 0351 07/30/19 0814  BP: (!) 125/56   123/70  Pulse: 73   79  Resp:    18  Temp:  98.7 F (37.1 C) 98.6 F (37 C) 98.7 F (37.1 C)  TempSrc:  Oral Oral   SpO2:    99%  Weight:      Height:       Body mass index is 31.97 kg/m.  Physical Exam Constitutional:      Comments: He appears comfortable and resting quietly in bed.  Cardiovascular:     Rate and Rhythm: Normal rate and regular rhythm.     Heart sounds: No murmur.  Pulmonary:     Effort: Pulmonary effort is normal.     Breath sounds: Normal breath sounds.  Psychiatric:        Mood and Affect: Mood normal.     Lab Results Lab Results  Component Value Date   WBC 9.7 07/29/2019   HGB 11.1 (L) 07/29/2019   HCT 34.2 (L) 07/29/2019   MCV 99.4 07/29/2019   PLT 371 07/29/2019    Lab Results  Component Value Date   CREATININE 0.76 07/29/2019   BUN 10 07/29/2019   NA 137 07/29/2019   K 3.0 (L) 07/29/2019   CL 103 07/29/2019   CO2 25 07/29/2019    Lab Results  Component Value Date   ALT  27 07/26/2019   AST 25 07/26/2019   ALKPHOS 92 07/26/2019   BILITOT 1.0 07/26/2019     Microbiology: Recent Results (from the past 240 hour(s))  Urine culture     Status: Abnormal   Collection Time: 07/24/19  2:57 PM   Specimen: Urine, Random  Result Value Ref Range Status   Specimen Description   Final    URINE, RANDOM Performed at [facility] [address], [place], [place] [address]    Special Requests   Final    NONE Performed at [facility], [place] [address], [place], [place] [address]    Culture >=100,000 COLONIES/mL STAPHYLOCOCCUS AUREUS (A)  Final   Report Status 07/27/2019 FINAL  Final   Organism ID, Bacteria STAPHYLOCOCCUS AUREUS (A)  Final      Susceptibility   Staphylococcus aureus - MIC*    CIPROFLOXACIN >=8 RESISTANT Resistant     GENTAMICIN <=0.5 SENSITIVE Sensitive      NITROFURANTOIN <=16 SENSITIVE Sensitive     OXACILLIN 0.5 SENSITIVE Sensitive     TETRACYCLINE <=1 SENSITIVE Sensitive     VANCOMYCIN <=0.5 SENSITIVE Sensitive     TRIMETH/SULFA <=10 SENSITIVE Sensitive     CLINDAMYCIN <=0.25 SENSITIVE Sensitive     RIFAMPIN <=0.5 SENSITIVE Sensitive     Inducible Clindamycin NEGATIVE Sensitive     * >=100,000 COLONIES/mL STAPHYLOCOCCUS AUREUS  Culture, blood (routine x 2)     Status: Abnormal   Collection Time: 07/24/19  3:10 PM   Specimen: BLOOD RIGHT HAND  Result Value Ref Range Status   Specimen Description   Final    BLOOD RIGHT HAND Performed at [facility] [address], [place], [place] [address]    Special Requests   Final    BOTTLES DRAWN AEROBIC ONLY Blood Culture results may not be optimal due to an inadequate volume of blood received in culture bottles Performed at [facility] [address], [place], [place] [address]    Culture  Setup Time   Final    AEROBIC BOTTLE ONLY GRAM POSITIVE COCCI CRITICAL VALUE NOTED.  VALUE IS CONSISTENT WITH PREVIOUSLY REPORTED AND CALLED VALUE.    Culture (A)  Final    STAPHYLOCOCCUS AUREUS SUSCEPTIBILITIES PERFORMED ON PREVIOUS CULTURE WITHIN THE LAST 5 DAYS. Performed at [facility], [place] [address], [place], [place] [address]    Report Status 07/27/2019 FINAL  Final  Culture, blood (routine x 2)     Status: Abnormal   Collection Time: 07/24/19  3:10 PM   Specimen: BLOOD  Result Value Ref Range Status   Specimen Description   Final    BLOOD LEFT ANTECUBITAL Performed at [facility] [address], [place], [place] [address]    Special Requests   Final    BOTTLES DRAWN AEROBIC AND ANAEROBIC Blood Culture adequate volume Performed at [facility] [address] [place], [place] [address]    Culture  Setup Time   Final    GRAM POSITIVE COCCI IN BOTH AEROBIC AND ANAEROBIC BOTTLES CRITICAL RESULT CALLED  TO, READ BACK BY AND VERIFIED WITH: Andres Shad PharmD 9:00 07/25/19 ([email or web address]) Performed at [facility], [place] [address] [place],  [address]    Culture STAPHYLOCOCCUS AUREUS (A)  Final   Report Status 07/27/2019 FINAL  Final   Organism ID, Bacteria STAPHYLOCOCCUS AUREUS  Final      Susceptibility   Staphylococcus aureus - MIC*    CIPROFLOXACIN >=  8 RESISTANT Resistant     ERYTHROMYCIN >=8 RESISTANT Resistant     GENTAMICIN <=0.5 SENSITIVE Sensitive     OXACILLIN 0.5 SENSITIVE Sensitive     TETRACYCLINE <=1 SENSITIVE Sensitive     VANCOMYCIN 1 SENSITIVE Sensitive     TRIMETH/SULFA <=10 SENSITIVE Sensitive     CLINDAMYCIN <=0.25 SENSITIVE Sensitive     RIFAMPIN <=0.5 SENSITIVE Sensitive     Inducible Clindamycin NEGATIVE Sensitive     * STAPHYLOCOCCUS AUREUS  Blood Culture ID Panel (Reflexed)     Status: Abnormal   Collection Time: 07/24/19  3:10 PM  Result Value Ref Range Status   Enterococcus species NOT DETECTED NOT DETECTED Final   Listeria monocytogenes NOT DETECTED NOT DETECTED Final   Staphylococcus species DETECTED (A) NOT DETECTED Final    Comment: CRITICAL RESULT CALLED TO, READ BACK BY AND VERIFIED WITH: Andres Shad PharmD 9:00 07/25/19 ([email or web address])    Staphylococcus aureus (BCID) DETECTED (A) NOT DETECTED Final    Comment: Methicillin (oxacillin) susceptible Staphylococcus aureus (MSSA). Preferred therapy is anti staphylococcal beta lactam antibiotic (Cefazolin or Nafcillin), unless clinically contraindicated. CRITICAL RESULT CALLED TO, READ BACK BY AND VERIFIED WITH: Andres Shad PharmD 9:00 07/25/19 ([email or web address])    Methicillin resistance NOT DETECTED NOT DETECTED Final   Streptococcus species NOT DETECTED NOT DETECTED Final   Streptococcus agalactiae NOT DETECTED NOT DETECTED Final   Streptococcus pneumoniae NOT DETECTED NOT DETECTED Final   Streptococcus pyogenes NOT DETECTED NOT DETECTED Final   Acinetobacter baumannii NOT DETECTED NOT DETECTED Final    Enterobacteriaceae species NOT DETECTED NOT DETECTED Final   Enterobacter cloacae complex NOT DETECTED NOT DETECTED Final   Escherichia coli NOT DETECTED NOT DETECTED Final   Klebsiella oxytoca NOT DETECTED NOT DETECTED Final   Klebsiella pneumoniae NOT DETECTED NOT DETECTED Final   Proteus species NOT DETECTED NOT DETECTED Final   Serratia marcescens NOT DETECTED NOT DETECTED Final   Haemophilus influenzae NOT DETECTED NOT DETECTED Final   Neisseria meningitidis NOT DETECTED NOT DETECTED Final   Pseudomonas aeruginosa NOT DETECTED NOT DETECTED Final   Candida albicans NOT DETECTED NOT DETECTED Final   Candida glabrata NOT DETECTED NOT DETECTED Final   Candida krusei NOT DETECTED NOT DETECTED Final   Candida parapsilosis NOT DETECTED NOT DETECTED Final   Candida tropicalis NOT DETECTED NOT DETECTED Final    Comment: Performed at [facility], [address] [place], [place] [address]  Respiratory Panel by RT PCR (Flu A&B, Covid) - Nasopharyngeal Swab     Status: None   Collection Time: 07/24/19  8:34 PM   Specimen: Nasopharyngeal Swab  Result Value Ref Range Status   SARS Coronavirus 2 by RT PCR NEGATIVE NEGATIVE Final    Comment: (NOTE) SARS-CoV-2 target nucleic acids are NOT DETECTED. The SARS-CoV-2 RNA is generally detectable in upper respiratoy specimens during the acute phase of infection. The lowest concentration of SARS-CoV-2 viral copies this assay can detect is 131 copies/mL. A negative result does not preclude SARS-Cov-2 infection and should not be used as the sole basis for treatment or other patient management decisions. A negative result may occur with  improper specimen collection/handling, submission of specimen other than nasopharyngeal swab, presence of viral mutation(s) within the areas targeted by this assay, and inadequate number of viral copies (<131 copies/mL). A negative result must be combined with clinical observations, patient history, and  epidemiological information. The expected result is Negative. Fact Sheet for Patients:  PinkCheek.be Fact Sheet for Healthcare Providers:  GravelBags.it This test  is not yet ap proved or cleared by the [place] and  has been authorized for detection and/or diagnosis of SARS-CoV-2 by FDA under an Emergency Use Authorization (EUA). This EUA will remain  in effect (meaning this test can be used) for the duration of the COVID-19 declaration under Section 564(b)(1) of the Act, 21 U.S.C. section 360bbb-3(b)(1), unless the authorization is terminated or revoked sooner.    Influenza A by PCR NEGATIVE NEGATIVE Final   Influenza B by PCR NEGATIVE NEGATIVE Final    Comment: (NOTE) The Xpert Xpress SARS-CoV-2/FLU/RSV assay is intended as an aid in  the diagnosis of influenza from Nasopharyngeal swab specimens and  should not be used as a sole basis for treatment. Nasal washings and  aspirates are unacceptable for Xpert Xpress SARS-CoV-2/FLU/RSV  testing. Fact Sheet for Patients: PinkCheek.be Fact Sheet for Healthcare Providers: GravelBags.it This test is not yet approved or cleared by the [place] FDA and  has been authorized for detection and/or diagnosis of SARS-CoV-2 by  FDA under an Emergency Use Authorization (EUA). This EUA will remain  in effect (meaning this test can be used) for the duration of the  Covid-19 declaration under Section 564(b)(1) of the Act, 21  U.S.C. section 360bbb-3(b)(1), unless the authorization is  terminated or revoked. Performed at [facility], [place] [address] [place], [place] [address]   Aerobic/Anaerobic Culture (surgical/deep wound)     Status: None (Preliminary result)   Collection Time: 07/25/19  1:43 AM   Specimen: Abscess  Result Value Ref Range Status   Specimen Description ABSCESS  Final   Special  Requests EPIDURAL SAMPLE A  Final   Gram Stain   Final    ABUNDANT WBC PRESENT, PREDOMINANTLY PMN ABUNDANT GRAM POSITIVE COCCI Performed at [facility], [place] [address] [place], [place] [address]    Culture   Final    MODERATE STAPHYLOCOCCUS AUREUS NO ANAEROBES ISOLATED; CULTURE IN PROGRESS FOR 5 DAYS    Report Status PENDING  Incomplete   Organism ID, Bacteria STAPHYLOCOCCUS AUREUS  Final      Susceptibility   Staphylococcus aureus - MIC*    CIPROFLOXACIN >=8 RESISTANT Resistant     ERYTHROMYCIN >=8 RESISTANT Resistant     GENTAMICIN <=0.5 SENSITIVE Sensitive     OXACILLIN 0.5 SENSITIVE Sensitive     TETRACYCLINE <=1 SENSITIVE Sensitive     VANCOMYCIN <=0.5 SENSITIVE Sensitive     TRIMETH/SULFA <=10 SENSITIVE Sensitive     CLINDAMYCIN <=0.25 SENSITIVE Sensitive     RIFAMPIN <=0.5 SENSITIVE Sensitive     Inducible Clindamycin NEGATIVE Sensitive     * MODERATE STAPHYLOCOCCUS AUREUS  Aerobic/Anaerobic Culture (surgical/deep wound)     Status: None (Preliminary result)   Collection Time: 07/25/19  1:46 AM   Specimen: Abscess  Result Value Ref Range Status   Specimen Description ABSCESS  Final   Special Requests EPIDURAL SAMPLE B  Final   Gram Stain   Final    RARE WBC PRESENT, PREDOMINANTLY PMN NO ORGANISMS SEEN Performed at [facility], [place] [address] [place],  [address]    Culture   Final    FEW STAPHYLOCOCCUS AUREUS NO ANAEROBES ISOLATED; CULTURE IN PROGRESS FOR 5 DAYS    Report Status PENDING  Incomplete   Organism ID, Bacteria STAPHYLOCOCCUS AUREUS  Final      Susceptibility   Staphylococcus aureus - MIC*    CIPROFLOXACIN >=8 RESISTANT Resistant     ERYTHROMYCIN >=8 RESISTANT Resistant  GENTAMICIN <=0.5 SENSITIVE Sensitive     OXACILLIN 0.5 SENSITIVE Sensitive     TETRACYCLINE <=1 SENSITIVE Sensitive     VANCOMYCIN 1 SENSITIVE Sensitive     TRIMETH/SULFA <=10 SENSITIVE Sensitive     CLINDAMYCIN <=0.25 SENSITIVE Sensitive     RIFAMPIN  <=0.5 SENSITIVE Sensitive     Inducible Clindamycin NEGATIVE Sensitive     * FEW STAPHYLOCOCCUS AUREUS  Aerobic/Anaerobic Culture (surgical/deep wound)     Status: None (Preliminary result)   Collection Time: 07/25/19  1:58 AM   Specimen: Abscess  Result Value Ref Range Status   Specimen Description ABSCESS  Final   Special Requests EPIDURAL SAMPLE C  Final   Gram Stain   Final    ABUNDANT WBC PRESENT, PREDOMINANTLY PMN ABUNDANT GRAM POSITIVE COCCI Performed at [facility], [address] [place], [place] [address]    Culture   Final    MODERATE STAPHYLOCOCCUS AUREUS NO ANAEROBES ISOLATED; CULTURE IN PROGRESS FOR 5 DAYS    Report Status PENDING  Incomplete   Organism ID, Bacteria STAPHYLOCOCCUS AUREUS  Final      Susceptibility   Staphylococcus aureus - MIC*    CIPROFLOXACIN >=8 RESISTANT Resistant     ERYTHROMYCIN >=8 RESISTANT Resistant     GENTAMICIN <=0.5 SENSITIVE Sensitive     OXACILLIN 0.5 SENSITIVE Sensitive     TETRACYCLINE <=1 SENSITIVE Sensitive     VANCOMYCIN <=0.5 SENSITIVE Sensitive     TRIMETH/SULFA <=10 SENSITIVE Sensitive     CLINDAMYCIN <=0.25 SENSITIVE Sensitive     RIFAMPIN <=0.5 SENSITIVE Sensitive     Inducible Clindamycin NEGATIVE Sensitive     * MODERATE STAPHYLOCOCCUS AUREUS  Aerobic/Anaerobic Culture (surgical/deep wound)     Status: None (Preliminary result)   Collection Time: 07/25/19  1:26 PM   Specimen: Abscess  Result Value Ref Range Status   Specimen Description ABSCESS  Final   Special Requests Normal  Final   Gram Stain   Final    FEW WBC PRESENT, PREDOMINANTLY PMN MODERATE GRAM POSITIVE COCCI Performed at [facility], [address] [place], [place] [address]    Culture   Final    MODERATE STAPHYLOCOCCUS AUREUS NO ANAEROBES ISOLATED; CULTURE IN PROGRESS FOR 5 DAYS    Report Status PENDING  Incomplete   Organism ID, Bacteria STAPHYLOCOCCUS AUREUS  Final      Susceptibility   Staphylococcus aureus - MIC*     CIPROFLOXACIN >=8 RESISTANT Resistant     ERYTHROMYCIN >=8 RESISTANT Resistant     GENTAMICIN <=0.5 SENSITIVE Sensitive     OXACILLIN 0.5 SENSITIVE Sensitive     TETRACYCLINE <=1 SENSITIVE Sensitive     VANCOMYCIN <=0.5 SENSITIVE Sensitive     TRIMETH/SULFA <=10 SENSITIVE Sensitive     CLINDAMYCIN <=0.25 SENSITIVE Sensitive     RIFAMPIN <=0.5 SENSITIVE Sensitive     Inducible Clindamycin NEGATIVE Sensitive     * MODERATE STAPHYLOCOCCUS AUREUS  Culture, blood (routine x 2)     Status: None (Preliminary result)   Collection Time: 07/27/19 10:26 AM   Specimen: BLOOD RIGHT ARM  Result Value Ref Range Status   Specimen Description BLOOD RIGHT ARM  Final   Special Requests   Final    BOTTLES DRAWN AEROBIC ONLY Blood Culture results may not be optimal due to an inadequate volume of blood received in culture bottles   Culture   Final    NO GROWTH 2 DAYS Performed at [facility], [address] [place],  [place] [address]    Report Status PENDING  Incomplete  Culture, blood (routine x 2)     Status: None (Preliminary result)   Collection Time: 07/27/19 10:29 AM   Specimen: BLOOD  Result Value Ref Range Status   Specimen Description BLOOD LEFT ANTECUBITAL  Final   Special Requests   Final    BOTTLES DRAWN AEROBIC AND ANAEROBIC Blood Culture results may not be optimal due to an inadequate volume of blood received in culture bottles   Culture   Final    NO GROWTH 2 DAYS Performed at [facility], [place] [address], [place], [place] [address]    Report Status PENDING  Incomplete    Michel Bickers, MD [facility] [phone] pager   [phone] cell 07/30/2019, 9:21 AM

## 2019-07-30 NOTE — Progress Notes (Addendum)
Inpatient Rehabilitation Admissions Coordinator  Inpatient rehab has been approved by [facility]. I met with patient and he is aware. We have filled all beds today. I am hopeful for CIR bed available tomorrow.  Danne Baxter, RN, MSN Rehab Admissions Coordinator [phone] 07/30/2019 3:55 PM

## 2019-07-30 NOTE — Progress Notes (Signed)
Occupational Therapy Treatment Patient Details Name: [name] MRN: [id number] DOB: [birth year] Today's Date: 07/30/2019    History of present illness Pt is a 54 yo male s/p acute osteomyelitis s/p abcess of psoas muscle with AMS and weakness.  Pt in OR for decompressive lumbar laminectomies L3-4 L4-5 (for evacuation of epidural abscess) with partial medial facetectomies and foraminotomies of the L3-L4 and L5 nerve roots 07/25/2019 by Dr. Saintclair Halsted. Bilateral psoas abscesses as well, with R psoas drained by IR. PMHx:b/l THAs, obesity, alcohol abuse.   OT comments  Pt requiring cues for log roll as pt did not have any carryover from previous sessions. Pt currently maxA +2 for bed mobility to EOB and modA +2 for sit to stand and stand pivot by PT moving RLE for steps with heavy use of RW. Pt sitting up in recliner for light grooming tasks with set-upA. Pt reports pain 7-8/10. Pt requires maxA for all other ADL. Pt would benefit from continued OT skilled services for ADL, mobility and safety. OT following acutely.   Follow Up Recommendations  CIR;Supervision/Assistance - 24 hour    Equipment Recommendations  Other (comment)(to be determined)    Recommendations for Other Services      Precautions / Restrictions Precautions Precautions: Back;Fall Precaution Booklet Issued: Yes (comment) Precaution Comments: pt re-educated on precautions Restrictions Weight Bearing Restrictions: No       Mobility Bed Mobility Overal bed mobility: Needs Assistance Bed Mobility: Rolling;Sidelying to Sit Rolling: Mod assist Sidelying to sit: Max assist;+2 for physical assistance       General bed mobility comments: max multimodal directional cues to complete task, modA for R LE mangement due to pain and weakness, maxAX2 for trunk elevation and LE management off bed. pt with increased pain however diminished upon sitting but remains constant per pt report  Transfers Overall transfer level: Needs  assistance Equipment used: Rolling walker (2 wheeled) Transfers: Sit to/from [facility] Sit to Stand: Mod assist;+2 physical assistance Stand pivot transfers: Mod assist;+2 physical assistance       General transfer comment: Requiring RW and OTR for UB Support; PT assisting with sequence of RLE movement by lifting it for him and moving RW    Balance Overall balance assessment: Needs assistance Sitting-balance support: Bilateral upper extremity supported;Feet supported Sitting balance-Leahy Scale: Fair Sitting balance - Comments: able to sit EOB without PT support ~1 minute, posterior leaning with pain Postural control: Posterior lean Standing balance support: Bilateral upper extremity supported;During functional activity Standing balance-Leahy Scale: Poor Standing balance comment: reliant on external assist and RW                           ADL either performed or assessed with clinical judgement   ADL Overall ADL's : Needs assistance/impaired     Grooming: Set up;Sitting                               Functional mobility during ADLs: Moderate assistance;Maximal assistance;+2 for physical assistance;+2 for safety/equipment;Rolling walker;Cueing for sequencing;Cueing for safety General ADL Comments: Pt limited by decreased strength, decreased mobility and decreased ability to care for self.     Vision       Perception     Praxis      Cognition Arousal/Alertness: Awake/alert Behavior During Therapy: Flat affect   Area of Impairment: Problem solving;Attention  Memory: Decreased recall of precautions Following Commands: Follows one step commands with increased time;Follows one step commands inconsistently     Problem Solving: Slow processing;Decreased initiation;Difficulty sequencing;Requires verbal cues;Requires tactile cues General Comments: Per last session, following commands, alert and oriented,  but does not remember getting OOB        Exercises     Shoulder Instructions       General Comments O2 >90% on RA    Pertinent Vitals/ Pain       Pain Assessment: 0-10 Pain Score: 7  Pain Location: back Pain Descriptors / Indicators: Discomfort;Grimacing;Guarding;Sore Pain Intervention(s): Monitored during session  Home Living                                          Prior Functioning/Environment              Frequency  Min 2X/week        Progress Toward Goals  OT Goals(current goals can now be found in the care plan section)  Progress towards OT goals: Progressing toward goals  Acute Rehab OT Goals Patient Stated Goal: to be in less pain OT Goal Formulation: With patient Time For Goal Achievement: 08/08/19 Potential to Achieve Goals: Good ADL Goals Pt Will Perform Grooming: with min guard assist;sitting;standing Pt Will Perform Lower Body Dressing: with min assist;sitting/lateral leans;sit to/from stand;with adaptive equipment Pt Will Transfer to Toilet: with min assist;ambulating;bedside commode;stand pivot transfer Pt Will Perform Toileting - Clothing Manipulation and hygiene: with mod assist;sitting/lateral leans;sit to/from stand Pt/caregiver will Perform Home Exercise Program: Increased strength;Both right and left upper extremity Additional ADL Goal #1: Pt will follow 100% of commands in 3/5 trials.  Plan Discharge plan remains appropriate    Co-evaluation    PT/OT/SLP Co-Evaluation/Treatment: Yes Reason for Co-Treatment: Complexity of the patient's impairments (multi-system involvement) PT goals addressed during session: Mobility/safety with mobility OT goals addressed during session: ADL's and self-care      AM-PAC OT "6 Clicks" Daily Activity     Outcome Measure   Help from another person eating meals?: None Help from another person taking care of personal grooming?: A Little Help from another person toileting, which  includes using toliet, bedpan, or urinal?: A Lot Help from another person bathing (including washing, rinsing, drying)?: A Lot Help from another person to put on and taking off regular upper body clothing?: A Lot Help from another person to put on and taking off regular lower body clothing?: Total 6 Click Score: 14    End of Session Equipment Utilized During Treatment: Gait belt;Rolling walker  OT Visit Diagnosis: Unsteadiness on feet (R26.81);Muscle weakness (generalized) (M62.81);Pain Pain - part of body: (back)   Activity Tolerance Patient limited by pain   Patient Left in chair;with call bell/phone within reach   Nurse Communication Mobility status        Time: [id number] OT Time Calculation (min): 32 min  Charges: OT General Charges $OT Visit: 1 Visit OT Treatments $Self Care/Home Management : 8-22 mins  Jefferey Pica OTR/L Acute Rehabilitation Services Pager: [phone] Office: [phone]    Tyreisha Ungar C 07/30/2019, 10:32 AM

## 2019-07-30 NOTE — Progress Notes (Signed)
Peripherally Inserted Central Catheter/Midline Placement  The IV Nurse has discussed with the patient and/or persons authorized to consent for the patient, the purpose of this procedure and the potential benefits and risks involved with this procedure.  The benefits include less needle sticks, lab draws from the catheter, and the patient may be discharged home with the catheter. Risks include, but not limited to, infection, bleeding, blood clot (thrombus formation), and puncture of an artery; nerve damage and irregular heartbeat and possibility to perform a PICC exchange if needed/ordered by physician.  Alternatives to this procedure were also discussed.  Bard Power PICC patient education guide, fact sheet on infection prevention and patient information card has been provided to patient /or left at bedside.    PICC/Midline Placement Documentation  PICC Single Lumen [id number] PICC Right Basilic 44 cm 0 cm (Active)  Indication for Insertion or Continuance of Line Home intravenous therapies (PICC only) 07/30/19 2150  Exposed Catheter (cm) 0 cm 07/30/19 2150  Site Assessment Clean;Dry;Intact 07/30/19 2150  Line Status Flushed;Saline locked;Blood return noted 07/30/19 2150  Dressing Type Transparent 07/30/19 2150  Dressing Status Clean;Dry;Intact;Antimicrobial disc in place 07/30/19 2150  Dressing Change Due 08/06/19 07/30/19 2150       Gordan Payment 07/30/2019, 9:51 PM

## 2019-07-30 NOTE — Progress Notes (Signed)
Patient ID: [name], male   DOB: 05/22/1966, 54 y.o.   MRN: [id number] Patient doing well significant proved lower extremity function  Still with left foot drop but much better now 3 out of 5 proximally still 3 out of 5  Continue antibiotics patient is having diarrhea we will check a C. difficile toxin rehab is evaluating patient pending insurance patient is stable to transfer to rehab.

## 2019-07-30 NOTE — Progress Notes (Addendum)
Inpatient Rehabilitation Admissions Coordinator  Inpt rehab consult received. I met with patient at bedside for rehab assessment. Discussed goals and expectations of an inpt rehab admit. He is agreeable.I will begin insurance authorization with [employer] for a possible admit today. I spoke with Margo Aye, NP and pt felt medically ready once insurance approves.  Danne Baxter, RN, MSN Rehab Admissions Coordinator [phone] 07/30/2019 9:13 AM

## 2019-07-31 ENCOUNTER — Other Ambulatory Visit: Payer: Self-pay

## 2019-07-31 ENCOUNTER — Inpatient Hospital Stay (HOSPITAL_COMMUNITY)
Admission: RE | Admit: 2019-07-31 | Discharge: 2019-08-10 | DRG: 560 | Disposition: A | Discharge status: Home Health | Payer: BC Managed Care – PPO | Source: Intra-hospital | Attending: Physical Medicine and Rehabilitation | Admitting: Physical Medicine and Rehabilitation

## 2019-07-31 DIAGNOSIS — Z825 Family history of asthma and other chronic lower respiratory diseases: Secondary | ICD-10-CM | POA: Diagnosis not present

## 2019-07-31 DIAGNOSIS — Z96643 Presence of artificial hip joint, bilateral: Secondary | ICD-10-CM | POA: Diagnosis present

## 2019-07-31 DIAGNOSIS — Z8249 Family history of ischemic heart disease and other diseases of the circulatory system: Secondary | ICD-10-CM | POA: Diagnosis not present

## 2019-07-31 DIAGNOSIS — N319 Neuromuscular dysfunction of bladder, unspecified: Secondary | ICD-10-CM | POA: Diagnosis present

## 2019-07-31 DIAGNOSIS — L8990 Pressure ulcer of unspecified site, unspecified stage: Secondary | ICD-10-CM | POA: Insufficient documentation

## 2019-07-31 DIAGNOSIS — Z9889 Other specified postprocedural states: Secondary | ICD-10-CM

## 2019-07-31 DIAGNOSIS — R338 Other retention of urine: Secondary | ICD-10-CM | POA: Diagnosis present

## 2019-07-31 DIAGNOSIS — E876 Hypokalemia: Secondary | ICD-10-CM | POA: Diagnosis not present

## 2019-07-31 DIAGNOSIS — F1010 Alcohol abuse, uncomplicated: Secondary | ICD-10-CM | POA: Diagnosis not present

## 2019-07-31 DIAGNOSIS — M62838 Other muscle spasm: Secondary | ICD-10-CM | POA: Diagnosis not present

## 2019-07-31 DIAGNOSIS — R7881 Bacteremia: Secondary | ICD-10-CM | POA: Diagnosis not present

## 2019-07-31 DIAGNOSIS — G834 Cauda equina syndrome: Secondary | ICD-10-CM | POA: Diagnosis present

## 2019-07-31 DIAGNOSIS — M7989 Other specified soft tissue disorders: Secondary | ICD-10-CM | POA: Diagnosis not present

## 2019-07-31 DIAGNOSIS — G4700 Insomnia, unspecified: Secondary | ICD-10-CM | POA: Diagnosis present

## 2019-07-31 DIAGNOSIS — B9561 Methicillin susceptible Staphylococcus aureus infection as the cause of diseases classified elsewhere: Secondary | ICD-10-CM | POA: Diagnosis not present

## 2019-07-31 DIAGNOSIS — Z4789 Encounter for other orthopedic aftercare: Principal | ICD-10-CM

## 2019-07-31 DIAGNOSIS — Z807 Family history of other malignant neoplasms of lymphoid, hematopoietic and related tissues: Secondary | ICD-10-CM

## 2019-07-31 DIAGNOSIS — L89312 Pressure ulcer of right buttock, stage 2: Secondary | ICD-10-CM | POA: Diagnosis not present

## 2019-07-31 DIAGNOSIS — M4646 Discitis, unspecified, lumbar region: Secondary | ICD-10-CM | POA: Diagnosis present

## 2019-07-31 DIAGNOSIS — Z87891 Personal history of nicotine dependence: Secondary | ICD-10-CM

## 2019-07-31 HISTORY — DX: Cauda equina syndrome: G83.4

## 2019-07-31 LAB — C DIFFICILE QUICK SCREEN W PCR REFLEX
C Diff antigen: NEGATIVE
C Diff interpretation: NOT DETECTED
C Diff toxin: NEGATIVE

## 2019-07-31 MED ORDER — SODIUM CHLORIDE 0.9 % IV SOLN
2.0000 g | INTRAVENOUS | Status: DC
  Filled 2019-07-31 (×5): qty 2000

## 2019-07-31 MED ORDER — TAMSULOSIN HCL 0.4 MG PO CAPS
0.4000 mg | ORAL_CAPSULE | Freq: Every day | ORAL | Status: DC
  Administered 2019-07-31 – 2019-08-09 (×10): 0.4 mg via ORAL
  Filled 2019-07-31 (×10): qty 1

## 2019-07-31 MED ORDER — ACETAMINOPHEN 650 MG RE SUPP: 650.0000 mg | RECTAL | Status: DC | PRN

## 2019-07-31 MED ORDER — OXYCODONE HCL 5 MG PO TABS
10.0000 mg | ORAL_TABLET | ORAL | Status: DC | PRN
  Administered 2019-07-31 – 2019-08-10 (×41): 10 mg via ORAL
  Filled 2019-07-31 (×41): qty 2

## 2019-07-31 MED ORDER — ONDANSETRON HCL 4 MG/2ML IJ SOLN: 4.0000 mg | Freq: Four times a day (QID) | INTRAMUSCULAR | Status: DC | PRN

## 2019-07-31 MED ORDER — CYCLOBENZAPRINE HCL 10 MG PO TABS
10.0000 mg | ORAL_TABLET | Freq: Three times a day (TID) | ORAL | Status: DC | PRN
  Administered 2019-08-01 – 2019-08-09 (×14): 10 mg via ORAL
  Filled 2019-07-31 (×9): qty 1
  Filled 2019-07-31: qty 2
  Filled 2019-07-31 (×7): qty 1

## 2019-07-31 MED ORDER — ONDANSETRON HCL 4 MG PO TABS: 4.0000 mg | ORAL_TABLET | Freq: Four times a day (QID) | ORAL | Status: DC | PRN

## 2019-07-31 MED ORDER — SODIUM CHLORIDE 0.9 % IV SOLN
12.0000 g | INTRAVENOUS | Status: DC
  Administered 2019-07-31 – 2019-08-09 (×10): 12 g via INTRAVENOUS
  Filled 2019-07-31: qty 12000
  Filled 2019-07-31: qty 10000
  Filled 2019-07-31 (×11): qty 12000

## 2019-07-31 MED ORDER — SORBITOL 70 % SOLN: 30.0000 mL | Freq: Every day | Status: DC | PRN

## 2019-07-31 MED ORDER — POTASSIUM CHLORIDE CRYS ER 20 MEQ PO TBCR
40.0000 meq | EXTENDED_RELEASE_TABLET | Freq: Once | ORAL | Status: AC
  Administered 2019-07-31: 40 meq via ORAL
  Filled 2019-07-31: qty 2

## 2019-07-31 MED ORDER — THIAMINE HCL 100 MG PO TABS
100.0000 mg | ORAL_TABLET | Freq: Every day | ORAL | Status: DC
  Administered 2019-08-01 – 2019-08-10 (×11): 100 mg via ORAL
  Filled 2019-07-31 (×10): qty 1

## 2019-07-31 MED ORDER — PREDNISONE 10 MG PO TABS
15.0000 mg | ORAL_TABLET | Freq: Every day | ORAL | Status: DC
  Administered 2019-08-01 – 2019-08-06 (×6): 15 mg via ORAL
  Filled 2019-07-31 (×6): qty 2

## 2019-07-31 MED ORDER — ACETAMINOPHEN 325 MG PO TABS
650.0000 mg | ORAL_TABLET | ORAL | Status: DC | PRN
  Administered 2019-07-31 – 2019-08-08 (×3): 650 mg via ORAL
  Filled 2019-07-31 (×3): qty 2

## 2019-07-31 MED ORDER — PANTOPRAZOLE SODIUM 40 MG PO TBEC
40.0000 mg | DELAYED_RELEASE_TABLET | Freq: Every day | ORAL | Status: DC
  Administered 2019-07-31 – 2019-08-09 (×10): 40 mg via ORAL
  Filled 2019-07-31 (×10): qty 1

## 2019-07-31 NOTE — PMR Pre-admission (Signed)
PMR Admission Coordinator Pre-Admission Assessment  Patient: [name] is an 54 y.o., male MRN: [id number] DOB: [birth year] Height: '6\' 1"'  (185.4 cm) Weight: 109.9 kg  Insurance Information HMO:     PPO: yes     PCP:      IPA:      80/20:      OTHER:  PRIMARY: [employer] [place]      Policy#: [id number]      Subscriber: pt CM Name: Santiago Glad      Phone#: [phone]     Fax#: [phone] Pre-Cert#: [id number] approved until 1/18 when updates are due      Employer: [employer] Pt's DOB is incorrect with BCBS. Patient and I spoke with HR, Hinton Dyer, who is to have DOB corrected. Her phone number is [phone] Benefits:  Phone #: [phone]     Name: 07/30/2019 Eff. Date: 05/27/2019     Deduct: $5000      Out of Pocket Max: $6000 includes deductible      Life Max: none CIR: 70%      SNF: 70% 60 days per year Outpatient: $70 per visit     Co-Pay: 30 visits combined PT and OT Home Health: 70%      Co-Pay: limited by medical neccesity DME: 70%     Co-Pay: 30% Providers: in network  SECONDARY: none       Medicaid Application Date:       Case Manager:  Disability Application Date:       Case Worker:   The "Data Collection Information Summary" for patients in Inpatient Rehabilitation Facilities with attached "Privacy Act [place] Records" was provided and verbally reviewed with: N/A  Emergency Contact Information Contact Information    Name Relation Home Work [place] Daughter [phone]        Current Medical History  Patient Admitting Diagnosis: epidural abscess   History of Present Illness: 54 year old right-handed male with history of remote tobacco abuse, right total hip arthroplasty 01/11/2013 and left total hip arthroplasty 10/12/2016.  Presented 07/25/2019 with progressive weakness lower extremities and back pain as well as left foot drop with urinary retention.  MRI of lumbar spine showed sizable epidural abscess L3-4 4-5 but also extensive amount of abscess in  the psoas muscle.  Admission labs showed urine drug screen negative, urine culture greater 100,000 Staph aureus, sodium 128, potassium 3.4, WBC 19,900, sedimentation rate 114.  Recent CT abdomen pelvis with contrast showed no acute findings.  Underwent decompressive lumbar laminectomies L3-4 4-5 for evacuation of epidural abscess with partial medial facetectomies and foraminotomies 07/25/2019 per Dr. Saintclair Halsted.  Slow Decadron taper.  Underwent CT aspiration of right psoas abscess per interventional radiology 07/25/2019 showing MSSA bacteremia..  Echocardiogram with ejection fraction of 60% no vegetation noted.  Patient currently maintained on nafcillin 2 g every 4 hours per infectious disease for bacteremia.  Hospital course pain management as well as routine back precautions.  Patient is maintained on a regular diet.   Patient's medical record from [facility] has been reviewed by the rehabilitation admission coordinator and physician.  Past Medical History  Past Medical History:  Diagnosis Date  . History of chicken pox   . Medical history non-contributory     Family History   family history includes COPD in his mother; Cancer in his father; Heart attack (age of onset: 68) in his maternal grandfather.  Prior Rehab/Hospitalizations Has the patient had prior rehab or hospitalizations prior to admission? Yes  Has the patient  had major surgery during 100 days prior to admission? Yes   Current Medications  Current Facility-Administered Medications:  .  0.9 %  sodium chloride infusion, 250 mL, Intravenous, Continuous, Kary Kos, MD, Last Rate: 1 mL/hr at 07/26/19 2124, 250 mL at 07/26/19 2124 .  acetaminophen (TYLENOL) tablet 650 mg, 650 mg, Oral, Q4H PRN, 650 mg at 07/30/19 2127 **OR** acetaminophen (TYLENOL) suppository 650 mg, 650 mg, Rectal, Q4H PRN, Kary Kos, MD, 650 mg at 07/26/19 7408 .  alum & mag hydroxide-simeth (MAALOX/MYLANTA) 200-200-20 MG/5ML suspension 30 mL, 30 mL, Oral, Q6H  PRN, Kary Kos, MD .  Chlorhexidine Gluconate Cloth 2 % PADS 6 each, 6 each, Topical, Daily, Kary Kos, MD, 6 each at 07/30/19 1000 .  cyclobenzaprine (FLEXERIL) tablet 10 mg, 10 mg, Oral, TID PRN, Kary Kos, MD, 10 mg at 07/30/19 1144 .  HYDROmorphone (DILAUDID) injection 0.5 mg, 0.5 mg, Intravenous, Q2H PRN, Kary Kos, MD .  ibuprofen (ADVIL) tablet 800 mg, 800 mg, Oral, Q8H PRN, Kary Kos, MD .  menthol-cetylpyridinium (CEPACOL) lozenge 3 mg, 1 lozenge, Oral, PRN **OR** phenol (CHLORASEPTIC) mouth spray 1 spray, 1 spray, Mouth/Throat, PRN, Kary Kos, MD .  nafcillin 2 g in sodium chloride 0.9 % 100 mL IVPB, 2 g, Intravenous, Q4H, Kary Kos, MD, Last Rate: 200 mL/hr at 07/31/19 0808, 2 g at 07/31/19 0808 .  ondansetron (ZOFRAN) tablet 4 mg, 4 mg, Oral, Q6H PRN **OR** ondansetron (ZOFRAN) injection 4 mg, 4 mg, Intravenous, Q6H PRN, Kary Kos, MD .  oxyCODONE (Oxy IR/ROXICODONE) immediate release tablet 10 mg, 10 mg, Oral, Q3H PRN, Kary Kos, MD, 10 mg at 07/30/19 1144 .  pantoprazole (PROTONIX) EC tablet 40 mg, 40 mg, Oral, QHS, Karren Cobble, RPH, 40 mg at 07/30/19 2127 .  predniSONE (DELTASONE) tablet 15 mg, 15 mg, Oral, Q breakfast, Kary Kos, MD, 15 mg at 07/31/19 1448 .  sodium chloride flush (NS) 0.9 % injection 10-40 mL, 10-40 mL, Intracatheter, Q12H, Kary Kos, MD, 10 mL at 07/31/19 0813 .  sodium chloride flush (NS) 0.9 % injection 10-40 mL, 10-40 mL, Intracatheter, PRN, Kary Kos, MD .  sodium chloride flush (NS) 0.9 % injection 10-40 mL, 10-40 mL, Intracatheter, Q12H, Kary Kos, MD .  sodium chloride flush (NS) 0.9 % injection 10-40 mL, 10-40 mL, Intracatheter, PRN, Kary Kos, MD .  sodium chloride flush (NS) 0.9 % injection 3 mL, 3 mL, Intravenous, Q12H, Kary Kos, MD, 3 mL at 07/31/19 0814 .  sodium chloride flush (NS) 0.9 % injection 3 mL, 3 mL, Intravenous, PRN, Kary Kos, MD .  thiamine tablet 100 mg, 100 mg, Oral, Daily, 100 mg at 07/31/19 0808 **OR**  [DISCONTINUED] thiamine (B-1) injection 100 mg, 100 mg, Intravenous, Daily, Joy, Shawn C, PA-C, 100 mg at 07/25/19 0919  Patients Current Diet:  Diet Order            Diet - low sodium heart healthy        Diet regular Room service appropriate? Yes; Fluid consistency: Thin  Diet effective now              Precautions / Restrictions Precautions Precautions: Back, Fall Precaution Booklet Issued: Yes (comment) Precaution Comments: pt re-educated on precautions Restrictions Weight Bearing Restrictions: No   Has the patient had 2 or more falls or a fall with injury in the past year? No  Prior Activity Level Limited Community (1-2x/wk): totally independent prior to 3 to 4 weeks pta; works as a Administrator, sports  Self Care: Did the patient need help bathing, dressing, using the toilet or eating? Independent  Indoor Mobility: Did the patient need assistance with walking from room to room (with or without device)? Independent  Stairs: Did the patient need assistance with internal or external stairs (with or without device)? Independent  Functional Cognition: Did the patient need help planning regular tasks such as shopping or remembering to take medications? [place] Devices/Equipment: Cane (specify quad or straight), Walker (specify type)  Prior Device Use: Indicate devices/aids used by the patient prior to current illness, exacerbation or injury? None of the above  Current Functional Level Cognition  Overall Cognitive Status: Within Functional Limits for tasks assessed Current Attention Level: Selective Orientation Level: Oriented X4 Following Commands: Follows one step commands with increased time, Follows one step commands inconsistently Safety/Judgement: Decreased awareness of safety, Decreased awareness of deficits General Comments: Per last session, following commands, alert and oriented, but does not  remember getting OOB    Extremity Assessment (includes Sensation/Coordination)  Upper Extremity Assessment: Generalized weakness  Lower Extremity Assessment: Generalized weakness, RLE deficits/detail RLE Deficits / Details: unable to take steps with RLE    ADLs  Overall ADL's : Needs assistance/impaired Eating/Feeding: NPO Grooming: Set up, Sitting Upper Body Bathing: Moderate assistance, Sitting Lower Body Bathing: Maximal assistance, +2 for physical assistance, +2 for safety/equipment, Cueing for safety, Cueing for sequencing, Sitting/lateral leans, Sit to/from stand Upper Body Dressing : Moderate assistance, Sitting Lower Body Dressing: Maximal assistance, Cueing for safety, Sitting/lateral leans, Sit to/from stand Toilet Transfer: Maximal assistance, Total assistance, +2 for physical assistance, +2 for safety/equipment, Cueing for safety Toilet Transfer Details (indicate cue type and reason): unable to attempt as pt unsafely leaning forward Toileting- Clothing Manipulation and Hygiene: Maximal assistance, Total assistance, +2 for physical assistance, +2 for safety/equipment, Cueing for sequencing, Sitting/lateral lean, Sit to/from stand Functional mobility during ADLs: Moderate assistance, Maximal assistance, +2 for physical assistance, +2 for safety/equipment, Rolling walker, Cueing for sequencing, Cueing for safety General ADL Comments: Pt limited by decreased strength, decreased mobility and decreased ability to care for self.    Mobility  Overal bed mobility: Needs Assistance Bed Mobility: Rolling, Sidelying to Sit Rolling: Mod assist Sidelying to sit: Max assist, +2 for physical assistance Sit to supine: Max assist, Total assist, +2 for physical assistance, +2 for safety/equipment General bed mobility comments: max multimodal directional cues to complete task, modA for R LE mangement due to pain and weakness, maxAX2 for trunk elevation and LE management off bed. pt with increased  pain however diminished upon sitting but remains constant per pt report    Transfers  Overall transfer level: Needs assistance Equipment used: Rolling walker (2 wheeled) Transfers: Sit to/from Stand, [employer] Transfers Sit to Stand: Mod assist, +2 physical assistance Stand pivot transfers: Mod assist, +2 physical assistance General transfer comment: Requiring RW and OTR for UB Support; PT assisting with sequence of RLE movement by lifting it for him and moving RW    Ambulation / Gait / Stairs / Wheelchair Mobility  Ambulation/Gait General Gait Details: limited by pain to stand pvt transfer to chair    Posture / Balance Dynamic Sitting Balance Sitting balance - Comments: able to sit EOB without PT support ~1 minute, posterior leaning with pain Balance Overall balance assessment: Needs assistance Sitting-balance support: Bilateral upper extremity supported, Feet supported Sitting balance-Leahy Scale: Fair Sitting balance - Comments: able to sit EOB without PT support ~1 minute, posterior leaning with  pain Postural control: Posterior lean Standing balance support: Bilateral upper extremity supported, During functional activity Standing balance-Leahy Scale: Poor Standing balance comment: reliant on external assist and RW    Special needs/care consideration BiPAP/CPAP  CPM  Continuous Drip IV PICC single lumen placed 08/30/3662 right basilic 44 cm. To be on Nafcillin for 6 weeks starting from 07/15/2019 Dialysis         Life Vest  Oxygen  Special Bed  Trach Size  Wound Vac  Skin surgical incision Bowel mgmt: continent LBM 07/30/2019. Was having diarrhea so on contact precautions with cdiff pending Bladder mgmt: #16 Fr placed 07/27/2019 Diabetic mgmt:  Behavioral consideration  Chemo/radiation  Designated visitor is daughter, Ander Purpura   Previous Home Environment  Living Arrangements: Alone  Lives With: Alone Available Help at Discharge: Family, Available 24 hours/day(seperated  wife, Manuela Schwartz, daughter and dtr in law) Type of Home: House Home Layout: One level Home Access: Stairs to enter Entrance Stairs-Rails: None Entrance Stairs-Number of Steps: 3 Bathroom Shower/Tub: Optometrist: Yes How Accessible: Accessible via walker [place]: No  Discharge Living Setting Plans for Discharge Living Setting: Lives with (comment)(to go stay at wife's home ( seperated for 2 years) and famil) Type of Home at Discharge: House Discharge Home Layout: One level Discharge Home Access: Stairs to enter Entrance Stairs-Rails: None Entrance Stairs-Number of Steps: 4 to 5 Discharge Bathroom Shower/Tub: Tub/shower unit, Curtain Discharge Bathroom Toilet: Standard Discharge Bathroom Accessibility: Yes How Accessible: Accessible via walker Does the patient have any problems obtaining your medications?: No  Social/Family/Support Systems Patient Roles: Spouse, Parent(Mechanic fulltime) Contact Information: daughter, Ander Purpura, main contact Anticipated Caregiver: wife, Manuela Schwartz ( seperated), daughter Ander Purpura and dtr in law Anticipated Caregiver's Contact Information: [phone] Ability/Limitations of Caregiver: family work but will work out a schedule; [name] is a CNA and is aware will need to admninster IV antibiotics Caregiver Availability: 24/7 Discharge Plan Discussed with Primary Caregiver: Yes Is Caregiver In Agreement with Plan?: Yes Does Caregiver/Family have Issues with Lodging/Transportation while Pt is in Rehab?: No  Goals/Additional Needs Patient/Family Goal for Rehab: supervision to min assist with PT and OT Expected length of stay: ELOS 2 to 3 weeks Special Service Needs: IV antibiotics at d/c per ID recommendations Pt/Family Agrees to Admission and willing to participate: Yes Program Orientation Provided & Reviewed with Pt/Caregiver Including Roles  & Responsibilities: Yes  Decrease burden of Care through  IP rehab admission:   Possible need for SNF placement upon discharge:   Patient Condition: I have reviewed medical records from [facility] , spoken with CM, and patient and daughter. I met with patient at the bedside for inpatient rehabilitation assessment.  Patient will benefit from ongoing PT and OT, can actively participate in 3 hours of therapy a day 5 days of the week, and can make measurable gains during the admission.  Patient will also benefit from the coordinated team approach during an Inpatient Acute Rehabilitation admission.  The patient will receive intensive therapy as well as Rehabilitation physician, nursing, social worker, and care management interventions.  Due to bladder management, bowel management, safety, skin/wound care, disease management, medication administration, pain management and patient education the patient requires 24 hour a day rehabilitation nursing.  The patient is currently mod to max assist with mobility and basic ADLs.  Discharge setting and therapy post discharge at home with home health is anticipated.  Patient has agreed to participate in the Acute Inpatient Rehabilitation Program and will admit today.  Preadmission Screen Completed By:  Cleatrice Burke, 07/31/2019 10:11 AM ______________________________________________________________________   Discussed status with Dr. Dagoberto Ligas on  07/31/2019 at  1024 and received approval for admission today.  Admission Coordinator:  Cleatrice Burke, RN, time  1024 Date  07/31/2019   Assessment/Plan: Diagnosis: 1. Does the need for close, 24 hr/day Medical supervision in concert with the patient's rehab needs make it unreasonable for this patient to be served in a less intensive setting? Yes 2. Co-Morbidities requiring supervision/potential complications: B/L Hip replacements, hypokalemia, obesity, MSSA bacteremia on Nafcillin 2g IV q4 hours x 6-8 weeks, R psoas abscess s/p aspiration 3. Due to bowel  management, safety, skin/wound care, disease management, medication administration, pain management and patient education, does the patient require 24 hr/day rehab nursing? Yes 4. Does the patient require coordinated care of a physician, rehab nurse, PT, OT, and SLP to address physical and functional deficits in the context of the above medical diagnosis(es)? Yes Addressing deficits in the following areas: balance, endurance, locomotion, strength, transferring, bathing, dressing, grooming and toileting 5. Can the patient actively participate in an intensive therapy program of at least 3 hrs of therapy 5 days a week? Yes 6. The potential for patient to make measurable gains while on inpatient rehab is good 7. Anticipated functional outcomes upon discharge from inpatient rehab: supervision and min assist PT, supervision and min assist OT, n/a SLP 8. Estimated rehab length of stay to reach the above functional goals is: 2-3 weeks 9. Anticipated discharge destination: Home 10. Overall Rehab/Functional Prognosis: good   MD Signature:

## 2019-07-31 NOTE — Progress Notes (Signed)
Patient admitted to IP rehab during shift. Patient denies pain at current time pain only when turning for skin assessment. Pressure ulcers noted to right lower buttock stage 2, right upper buttock stage 2 and medial buttocks. Foam dressing applied to areas. Patient informed of safety plan ad visitor pplicy, and understands both. Patient is currently resting in bed at time. Adria Devon, LPN

## 2019-07-31 NOTE — Progress Notes (Signed)
Patient ID: [name], male   DOB: 06/14/1966, 54 y.o.   MRN: [id number]         [facility] for Infectious Disease  Date of Admission:  07/24/2019   Total days of antibiotics 8        Day 6 nafcillin         ASSESSMENT: He has MSSA bacteremia complicated by lumbar vertebral infection and possible CNS septic emboli.  He has no other evidence of endocarditis by exam or TTE.  I do not think that a TEE will change his management or outcome and I would not pursue it at this time.  I plan on 6 weeks of IV nafcillin starting from 07/25/2019, the date of his lumbar surgery.  PLAN: 1. Continue nafcillin 2. I will sign off now and arrange follow-up in my clinic  Diagnosis: Lumbar infection  Culture Result: MSSA  Allergies  Allergen Reactions  . Bee Venom Anaphylaxis  . Hydrocodone Nausea And Vomiting    OPAT Orders Discharge antibiotics: Per pharmacy protocol nafcillin  Duration: 6 weeks End Date: 09/04/2019  [facility] Care Per Protocol:  Labs weekly while on IV antibiotics: _x_ CBC with differential _x_ BMP __ CMP _x_ CRP _x_ ESR __ Vancomycin trough __ CK  __ Please pull PIC at completion of IV antibiotics _x_ Please leave PIC in place until doctor has seen patient or been notified  Fax weekly labs to [phone]  Clinic Follow Up Appt: 09/03/2019  Principal Problem:   Bacteremia due to methicillin susceptible Staphylococcus aureus (MSSA) Active Problems:   S/P lumbar laminectomy   Epidural abscess, L2-L5   Lumbar discitis   Cerebral septic emboli (HCC)   Scheduled Meds: . Chlorhexidine Gluconate Cloth  6 each Topical Daily  . pantoprazole  40 mg Oral QHS  . predniSONE  15 mg Oral Q breakfast  . sodium chloride flush  10-40 mL Intracatheter Q12H  . sodium chloride flush  10-40 mL Intracatheter Q12H  . sodium chloride flush  3 mL Intravenous Q12H  . thiamine  100 mg Oral Daily   Continuous Infusions: . sodium chloride 250 mL (07/26/19 2124)  .  nafcillin IV 2 g (07/31/19 1202)   PRN Meds:.acetaminophen **OR** acetaminophen, alum & mag hydroxide-simeth, cyclobenzaprine, HYDROmorphone (DILAUDID) injection, ibuprofen, menthol-cetylpyridinium **OR** phenol, ondansetron **OR** ondansetron (ZOFRAN) IV, oxyCODONE, sodium chloride flush, sodium chloride flush, sodium chloride flush   SUBJECTIVE: He is feeling much better.  He says that he was sick for about 3 weeks before admission 1 week ago.  He denies having any back pain but tells me that his legs were "weak as water".  Review of Systems: Review of Systems  Constitutional: Negative for chills and fever.  Musculoskeletal: Negative for back pain.  Neurological: Positive for weakness.    Allergies  Allergen Reactions  . Bee Venom Anaphylaxis  . Hydrocodone Nausea And Vomiting    OBJECTIVE: Vitals:   07/31/19 0300 07/31/19 0926 07/31/19 1158 07/31/19 1200  BP:  (!) 153/70 (!) 170/74 (!) 166/77  Pulse:  73 68   Resp:  20 20   Temp: 98.6 F (37 C) 98 F (36.7 C) 98.1 F (36.7 C)   TempSrc: Oral     SpO2:  98% 96%   Weight:      Height:       Body mass index is 31.97 kg/m.  Physical Exam Constitutional:      Comments: He appears comfortable and resting quietly in bed.  Cardiovascular:  Rate and Rhythm: Normal rate and regular rhythm.     Heart sounds: No murmur.  Pulmonary:     Effort: Pulmonary effort is normal.     Breath sounds: Normal breath sounds.  Psychiatric:        Mood and Affect: Mood normal.     Lab Results Lab Results  Component Value Date   WBC 9.7 07/29/2019   HGB 11.1 (L) 07/29/2019   HCT 34.2 (L) 07/29/2019   MCV 99.4 07/29/2019   PLT 371 07/29/2019    Lab Results  Component Value Date   CREATININE 0.76 07/29/2019   BUN 10 07/29/2019   NA 137 07/29/2019   K 3.0 (L) 07/29/2019   CL 103 07/29/2019   CO2 25 07/29/2019    Lab Results  Component Value Date   ALT 27 07/26/2019   AST 25 07/26/2019   ALKPHOS 92 07/26/2019    BILITOT 1.0 07/26/2019     Microbiology: Recent Results (from the past 240 hour(s))  Urine culture     Status: Abnormal   Collection Time: 07/24/19  2:57 PM   Specimen: Urine, Random  Result Value Ref Range Status   Specimen Description   Final    URINE, RANDOM Performed at [facility], [place] [address] [place], [place] [address]    Special Requests   Final    NONE Performed at [facility], [place] [address], [place], [place] [address]    Culture >=100,000 COLONIES/mL STAPHYLOCOCCUS AUREUS (A)  Final   Report Status 07/27/2019 FINAL  Final   Organism ID, Bacteria STAPHYLOCOCCUS AUREUS (A)  Final      Susceptibility   Staphylococcus aureus - MIC*    CIPROFLOXACIN >=8 RESISTANT Resistant     GENTAMICIN <=0.5 SENSITIVE Sensitive     NITROFURANTOIN <=16 SENSITIVE Sensitive     OXACILLIN 0.5 SENSITIVE Sensitive     TETRACYCLINE <=1 SENSITIVE Sensitive     VANCOMYCIN <=0.5 SENSITIVE Sensitive     TRIMETH/SULFA <=10 SENSITIVE Sensitive     CLINDAMYCIN <=0.25 SENSITIVE Sensitive     RIFAMPIN <=0.5 SENSITIVE Sensitive     Inducible Clindamycin NEGATIVE Sensitive     * >=100,000 COLONIES/mL STAPHYLOCOCCUS AUREUS  Culture, blood (routine x 2)     Status: Abnormal   Collection Time: 07/24/19  3:10 PM   Specimen: BLOOD RIGHT HAND  Result Value Ref Range Status   Specimen Description   Final    BLOOD RIGHT HAND Performed at [place] [address], [place], [place] [address]    Special Requests   Final    BOTTLES DRAWN AEROBIC ONLY Blood Culture results may not be optimal due to an inadequate volume of blood received in culture bottles Performed at [place] [address], [place], [place] [address]    Culture  Setup Time   Final    AEROBIC BOTTLE ONLY GRAM POSITIVE COCCI CRITICAL VALUE NOTED.  VALUE IS CONSISTENT WITH PREVIOUSLY REPORTED AND CALLED VALUE.    Culture (A)  Final    STAPHYLOCOCCUS  AUREUS SUSCEPTIBILITIES PERFORMED ON PREVIOUS CULTURE WITHIN THE LAST 5 DAYS. Performed at [facility], [place] [address], [place], [place] [address]    Report Status 07/27/2019 FINAL  Final  Culture, blood (routine x 2)     Status: Abnormal   Collection Time: 07/24/19  3:10 PM   Specimen: BLOOD  Result Value Ref Range Status   Specimen Description   Final    BLOOD  LEFT ANTECUBITAL Performed at [place] [address], [place], [place] [address]    Special Requests   Final    BOTTLES DRAWN AEROBIC AND ANAEROBIC Blood Culture adequate volume Performed at [place] [address], [place], [place] [address]    Culture  Setup Time   Final    GRAM POSITIVE COCCI IN BOTH AEROBIC AND ANAEROBIC BOTTLES CRITICAL RESULT CALLED TO, READ BACK BY AND VERIFIED WITH: Andres Shad PharmD 9:00 07/25/19 ([email or web address]) Performed at [facility], [place] [address], [place], [place] [address]    Culture STAPHYLOCOCCUS AUREUS (A)  Final   Report Status 07/27/2019 FINAL  Final   Organism ID, Bacteria STAPHYLOCOCCUS AUREUS  Final      Susceptibility   Staphylococcus aureus - MIC*    CIPROFLOXACIN >=8 RESISTANT Resistant     ERYTHROMYCIN >=8 RESISTANT Resistant     GENTAMICIN <=0.5 SENSITIVE Sensitive     OXACILLIN 0.5 SENSITIVE Sensitive     TETRACYCLINE <=1 SENSITIVE Sensitive     VANCOMYCIN 1 SENSITIVE Sensitive     TRIMETH/SULFA <=10 SENSITIVE Sensitive     CLINDAMYCIN <=0.25 SENSITIVE Sensitive     RIFAMPIN <=0.5 SENSITIVE Sensitive     Inducible Clindamycin NEGATIVE Sensitive     * STAPHYLOCOCCUS AUREUS  Blood Culture ID Panel (Reflexed)     Status: Abnormal   Collection Time: 07/24/19  3:10 PM  Result Value Ref Range Status   Enterococcus species NOT DETECTED NOT DETECTED Final   Listeria monocytogenes NOT DETECTED NOT DETECTED Final   Staphylococcus species DETECTED (A) NOT DETECTED Final    Comment: CRITICAL RESULT CALLED TO, READ BACK BY AND  VERIFIED WITH: Andres Shad PharmD 9:00 07/25/19 ([email or web address])    Staphylococcus aureus (BCID) DETECTED (A) NOT DETECTED Final    Comment: Methicillin (oxacillin) susceptible Staphylococcus aureus (MSSA). Preferred therapy is anti staphylococcal beta lactam antibiotic (Cefazolin or Nafcillin), unless clinically contraindicated. CRITICAL RESULT CALLED TO, READ BACK BY AND VERIFIED WITH: Andres Shad PharmD 9:00 07/25/19 ([email or web address])    Methicillin resistance NOT DETECTED NOT DETECTED Final   Streptococcus species NOT DETECTED NOT DETECTED Final   Streptococcus agalactiae NOT DETECTED NOT DETECTED Final   Streptococcus pneumoniae NOT DETECTED NOT DETECTED Final   Streptococcus pyogenes NOT DETECTED NOT DETECTED Final   Acinetobacter baumannii NOT DETECTED NOT DETECTED Final   Enterobacteriaceae species NOT DETECTED NOT DETECTED Final   Enterobacter cloacae complex NOT DETECTED NOT DETECTED Final   Escherichia coli NOT DETECTED NOT DETECTED Final   Klebsiella oxytoca NOT DETECTED NOT DETECTED Final   Klebsiella pneumoniae NOT DETECTED NOT DETECTED Final   Proteus species NOT DETECTED NOT DETECTED Final   Serratia marcescens NOT DETECTED NOT DETECTED Final   Haemophilus influenzae NOT DETECTED NOT DETECTED Final   Neisseria meningitidis NOT DETECTED NOT DETECTED Final   Pseudomonas aeruginosa NOT DETECTED NOT DETECTED Final   Candida albicans NOT DETECTED NOT DETECTED Final   Candida glabrata NOT DETECTED NOT DETECTED Final   Candida krusei NOT DETECTED NOT DETECTED Final   Candida parapsilosis NOT DETECTED NOT DETECTED Final   Candida tropicalis NOT DETECTED NOT DETECTED Final    Comment: Performed at [facility], [address], [place],  [address]  Respiratory Panel by RT PCR (Flu A&B, Covid) - Nasopharyngeal Swab     Status: None   Collection Time: 07/24/19  8:34 PM   Specimen: Nasopharyngeal Swab  Result Value Ref Range Status   SARS Coronavirus 2 by RT  PCR NEGATIVE NEGATIVE Final      Comment: (NOTE) SARS-CoV-2 target nucleic acids are NOT DETECTED. The SARS-CoV-2 RNA is generally detectable in upper respiratoy specimens during the acute phase of infection. The lowest concentration of SARS-CoV-2 viral copies this assay can detect is 131 copies/mL. A negative result does not preclude SARS-Cov-2 infection and should not be used as the sole basis for treatment or other patient management decisions. A negative result may occur with  improper specimen collection/handling, submission of specimen other than nasopharyngeal swab, presence of viral mutation(s) within the areas targeted by this assay, and inadequate number of viral copies (<131 copies/mL). A negative result must be combined with clinical observations, patient history, and epidemiological information. The expected result is Negative. Fact Sheet for Patients:  PinkCheek.be Fact Sheet for Healthcare Providers:  GravelBags.it This test is not yet ap proved or cleared by the [place] FDA and  has been authorized for detection and/or diagnosis of SARS-CoV-2 by FDA under an Emergency Use Authorization (EUA). This EUA will remain  in effect (meaning this test can be used) for the duration of the COVID-19 declaration under Section 564(b)(1) of the Act, 21 U.S.C. section 360bbb-3(b)(1), unless the authorization is terminated or revoked sooner.    Influenza A by PCR NEGATIVE NEGATIVE Final   Influenza B by PCR NEGATIVE NEGATIVE Final    Comment: (NOTE) The Xpert Xpress SARS-CoV-2/FLU/RSV assay is intended as an aid in  the diagnosis of influenza from Nasopharyngeal swab specimens and  should not be used as a sole basis for treatment. Nasal washings and  aspirates are unacceptable for Xpert Xpress SARS-CoV-2/FLU/RSV  testing. Fact Sheet for Patients: PinkCheek.be Fact Sheet for Healthcare  Providers: GravelBags.it This test is not yet approved or cleared by the [place] FDA and  has been authorized for detection and/or diagnosis of SARS-CoV-2 by  FDA under an Emergency Use Authorization (EUA). This EUA will remain  in effect (meaning this test can be used) for the duration of the  Covid-19 declaration under Section 564(b)(1) of the Act, 21  U.S.C. section 360bbb-3(b)(1), unless the authorization is  terminated or revoked. Performed at [facility], [place] [address], [place], [place] [address]   Aerobic/Anaerobic Culture (surgical/deep wound)     Status: None   Collection Time: 07/25/19  1:43 AM   Specimen: Abscess  Result Value Ref Range Status   Specimen Description ABSCESS  Final   Special Requests EPIDURAL SAMPLE A  Final   Gram Stain   Final    ABUNDANT WBC PRESENT, PREDOMINANTLY PMN ABUNDANT GRAM POSITIVE COCCI    Culture   Final    MODERATE STAPHYLOCOCCUS AUREUS NO ANAEROBES ISOLATED Performed at [facility], [place] [address], [place], [place] [address]    Report Status 07/30/2019 FINAL  Final   Organism ID, Bacteria STAPHYLOCOCCUS AUREUS  Final      Susceptibility   Staphylococcus aureus - MIC*    CIPROFLOXACIN >=8 RESISTANT Resistant     ERYTHROMYCIN >=8 RESISTANT Resistant     GENTAMICIN <=0.5 SENSITIVE Sensitive     OXACILLIN 0.5 SENSITIVE Sensitive     TETRACYCLINE <=1 SENSITIVE Sensitive     VANCOMYCIN <=0.5 SENSITIVE Sensitive     TRIMETH/SULFA <=10 SENSITIVE Sensitive     CLINDAMYCIN <=0.25 SENSITIVE Sensitive     RIFAMPIN <=0.5 SENSITIVE Sensitive     Inducible Clindamycin NEGATIVE Sensitive     * MODERATE STAPHYLOCOCCUS AUREUS  Aerobic/Anaerobic Culture (surgical/deep wound)     Status: None  Collection Time: 07/25/19  1:46 AM   Specimen: Abscess  Result Value Ref Range Status   Specimen Description ABSCESS  Final   Special Requests EPIDURAL SAMPLE B  Final   Gram Stain   Final     RARE WBC PRESENT, PREDOMINANTLY PMN NO ORGANISMS SEEN    Culture   Final    FEW STAPHYLOCOCCUS AUREUS NO ANAEROBES ISOLATED Performed at [facility], [place] [address], [place], [place] [address]    Report Status 07/30/2019 FINAL  Final   Organism ID, Bacteria STAPHYLOCOCCUS AUREUS  Final      Susceptibility   Staphylococcus aureus - MIC*    CIPROFLOXACIN >=8 RESISTANT Resistant     ERYTHROMYCIN >=8 RESISTANT Resistant     GENTAMICIN <=0.5 SENSITIVE Sensitive     OXACILLIN 0.5 SENSITIVE Sensitive     TETRACYCLINE <=1 SENSITIVE Sensitive     VANCOMYCIN 1 SENSITIVE Sensitive     TRIMETH/SULFA <=10 SENSITIVE Sensitive     CLINDAMYCIN <=0.25 SENSITIVE Sensitive     RIFAMPIN <=0.5 SENSITIVE Sensitive     Inducible Clindamycin NEGATIVE Sensitive     * FEW STAPHYLOCOCCUS AUREUS  Aerobic/Anaerobic Culture (surgical/deep wound)     Status: None   Collection Time: 07/25/19  1:58 AM   Specimen: Abscess  Result Value Ref Range Status   Specimen Description ABSCESS  Final   Special Requests EPIDURAL SAMPLE C  Final   Gram Stain   Final    ABUNDANT WBC PRESENT, PREDOMINANTLY PMN ABUNDANT GRAM POSITIVE COCCI    Culture   Final    MODERATE STAPHYLOCOCCUS AUREUS NO ANAEROBES ISOLATED Performed at [facility], [place] [address] [place], [place] [address]    Report Status 07/30/2019 FINAL  Final   Organism ID, Bacteria STAPHYLOCOCCUS AUREUS  Final      Susceptibility   Staphylococcus aureus - MIC*    CIPROFLOXACIN >=8 RESISTANT Resistant     ERYTHROMYCIN >=8 RESISTANT Resistant     GENTAMICIN <=0.5 SENSITIVE Sensitive     OXACILLIN 0.5 SENSITIVE Sensitive     TETRACYCLINE <=1 SENSITIVE Sensitive     VANCOMYCIN <=0.5 SENSITIVE Sensitive     TRIMETH/SULFA <=10 SENSITIVE Sensitive     CLINDAMYCIN <=0.25 SENSITIVE Sensitive     RIFAMPIN <=0.5 SENSITIVE Sensitive     Inducible Clindamycin NEGATIVE Sensitive     * MODERATE STAPHYLOCOCCUS AUREUS  Aerobic/Anaerobic Culture  (surgical/deep wound)     Status: None   Collection Time: 07/25/19  1:26 PM   Specimen: Abscess  Result Value Ref Range Status   Specimen Description ABSCESS  Final   Special Requests Normal  Final   Gram Stain   Final    FEW WBC PRESENT, PREDOMINANTLY PMN MODERATE GRAM POSITIVE COCCI    Culture   Final    MODERATE STAPHYLOCOCCUS AUREUS NO ANAEROBES ISOLATED Performed at [facility], [address] [place], [place] [address]    Report Status 07/30/2019 FINAL  Final   Organism ID, Bacteria STAPHYLOCOCCUS AUREUS  Final      Susceptibility   Staphylococcus aureus - MIC*    CIPROFLOXACIN >=8 RESISTANT Resistant     ERYTHROMYCIN >=8 RESISTANT Resistant     GENTAMICIN <=0.5 SENSITIVE Sensitive     OXACILLIN 0.5 SENSITIVE Sensitive     TETRACYCLINE <=1 SENSITIVE Sensitive     VANCOMYCIN <=0.5 SENSITIVE Sensitive     TRIMETH/SULFA <=10 SENSITIVE Sensitive     CLINDAMYCIN <=0.25 SENSITIVE Sensitive     RIFAMPIN <=0.5 SENSITIVE Sensitive  Inducible Clindamycin NEGATIVE Sensitive     * MODERATE STAPHYLOCOCCUS AUREUS  Culture, blood (routine x 2)     Status: None (Preliminary result)   Collection Time: 07/27/19 10:26 AM   Specimen: BLOOD RIGHT ARM  Result Value Ref Range Status   Specimen Description BLOOD RIGHT ARM  Final   Special Requests   Final    BOTTLES DRAWN AEROBIC ONLY Blood Culture results may not be optimal due to an inadequate volume of blood received in culture bottles   Culture   Final    NO GROWTH 4 DAYS Performed at [facility], [place] [address], [place], [place] [address]    Report Status PENDING  Incomplete  Culture, blood (routine x 2)     Status: None (Preliminary result)   Collection Time: 07/27/19 10:29 AM   Specimen: BLOOD  Result Value Ref Range Status   Specimen Description BLOOD LEFT ANTECUBITAL  Final   Special Requests   Final    BOTTLES DRAWN AEROBIC AND ANAEROBIC Blood Culture results may not be optimal due to an inadequate volume of  blood received in culture bottles   Culture   Final    NO GROWTH 4 DAYS Performed at [facility], [place] [address] [place], [place] [address]    Report Status PENDING  Incomplete  C difficile quick scan w PCR reflex     Status: None   Collection Time: 07/31/19  1:17 AM   Specimen: STOOL  Result Value Ref Range Status   C Diff antigen NEGATIVE NEGATIVE Final   C Diff toxin NEGATIVE NEGATIVE Final   C Diff interpretation No C. difficile detected.  Final    Comment: Performed at [facility], [place] [address], [place], [place] [address]    Michel Bickers, [place] [facility] [phone] pager   [phone] cell 07/31/2019, 1:33 PM

## 2019-07-31 NOTE — Progress Notes (Signed)
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 To discharging provider:  please sign these orders via discharge navigator,  Select New Orders & click on the button choice - Manage This Unsigned Work.     Thank you for allowing pharmacy to be a part of this patient's care.  Jimmy Footman, PharmD, BCPS, BCIDP Infectious Diseases Clinical Pharmacist Phone: [phone] 07/31/2019, 2:23 PM

## 2019-07-31 NOTE — Plan of Care (Signed)
  Problem: Consults Goal: RH SPINAL CORD INJURY PATIENT EDUCATION Description:  See Patient Education module for education specifics.  Outcome: Progressing Goal: Skin Care Protocol Initiated - if Braden Score 18 or less Description: If consults are not indicated, leave blank or document N/A Outcome: Progressing   Problem: SCI BOWEL ELIMINATION Goal: RH STG MANAGE BOWEL WITH ASSISTANCE Description: STG Manage Bowel with mod Assistance. Outcome: Progressing Goal: RH STG SCI MANAGE BOWEL WITH MEDICATION WITH ASSISTANCE Description: STG SCI Manage bowel with medication with mod assistance. Outcome: Progressing   Problem: SCI BLADDER ELIMINATION Goal: RH STG MANAGE BLADDER WITH ASSISTANCE Description: STG Manage Bladder With mod Assistance Outcome: Progressing Goal: RH STG MANAGE BLADDER WITH EQUIPMENT WITH ASSISTANCE Description: STG Manage Bladder With Equipment With max/total Assistance Outcome: Progressing   Problem: RH SKIN INTEGRITY Goal: RH STG SKIN FREE OF INFECTION/BREAKDOWN Description: Patients skin will remain free from further infection or breakdown with mod assist. Outcome: Progressing Goal: RH STG MAINTAIN SKIN INTEGRITY WITH ASSISTANCE Description: STG Maintain Skin Integrity With mod Assistance. Outcome: Progressing Goal: RH STG ABLE TO PERFORM INCISION/WOUND CARE W/ASSISTANCE Description: STG Able To Perform Incision/Wound Care With max/total Assistance. Outcome: Progressing   Problem: RH SAFETY Goal: RH STG ADHERE TO SAFETY PRECAUTIONS W/ASSISTANCE/DEVICE Description: STG Adhere to Safety Precautions With cues Assistance/Device. Outcome: Progressing   Problem: RH PAIN MANAGEMENT Goal: RH STG PAIN MANAGED AT OR BELOW PT'S PAIN GOAL Description: < 4 Outcome: Progressing   Problem: RH KNOWLEDGE DEFICIT SCI Goal: RH STG INCREASE KNOWLEDGE OF SELF CARE AFTER SCI Description: With mod assist. Outcome: Progressing

## 2019-07-31 NOTE — Progress Notes (Signed)
Patient's belongings packed up, midline IV and PICC line intact, family contacted by patient, and patient moved from 4NP to 4MW inpatient rehab.

## 2019-07-31 NOTE — Progress Notes (Signed)
Inpatient Rehabilitation Admissions Coordinator  I have insurance approval and bed available to admit pt to today. Pt in agreement. RN CM,  Kristi as well as Margo Aye, NP made aware. I will make the arrangements.  Danne Baxter, RN, MSN Rehab Admissions Coordinator [phone] 07/31/2019 9:56 AM

## 2019-07-31 NOTE — H&P (Addendum)
Physical Medicine and Rehabilitation Admission H&P   CC: incomplete cauda equina syndrome No chief complaint on file. : HPI: [name] is a 54 year old right-handed male with history of remote tobacco abuse, right total hip arthroplasty 01/11/2013 and left total hip arthroplasty 10/12/2016.  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  He has a daughter and son in the area.  Presented 07/25/2019 with progressive weakness lower extremities and back pain as well as left foot drop with urinary retention.  MRI of lumbar spine showed sizable epidural abscess L3-4 4-5 but also extensive amount of abscess in the psoas muscle.  Admission labs showed urine drug screen negative, urine culture greater 100,000 Staph aureus, sodium 128, potassium 3.4, WBC 19,900, sedimentation rate 114.  Recent CT abdomen pelvis with contrast showed no acute findings.  Underwent decompressive lumbar laminectomies L3-4 4-5 for evacuation of epidural abscess with partial medial facetectomies and foraminotomies 07/25/2019 per Dr. Saintclair Halsted.  Slow Decadron taper.  Underwent CT aspiration of right psoas abscess per interventional radiology 07/25/2019 showing MSSA bacteremia..  Echocardiogram with ejection fraction of 60% no vegetation noted.  Patient currently maintained on nafcillin 2 g every 4 hours per infectious disease for bacteremia.  Hospital course pain management as well as routine back precautions.  Patient is maintained on a regular diet.  Therapy evaluations completed and patient was admitted for a comprehensive rehab program.  Pt reports his sensation is "the same all over", however reports having pain as soon as he moves- explained the idea of rehab is to move- but pt admitted hurt to even sit up to eat lunch today. Meds "helpful". LBM last night-denies constipation Sx's.   Has foley since unable to void- so placed foley. Got PICC line placed today. C/O muscle spasms in  buttocks and back, no spasticity. Not sleeping well at all- which is abnormal for him.   Review of Systems  Constitutional: Negative for chills and fever.  HENT: Negative for hearing loss.   Eyes: Negative for blurred vision and double vision.  Respiratory: Negative for cough and shortness of breath.   Cardiovascular: Positive for leg swelling. Negative for chest pain and palpitations.  Gastrointestinal: Positive for constipation. Negative for heartburn, nausea and vomiting.  Genitourinary: Negative for dysuria, flank pain and hematuria.       Urinary retention.  Musculoskeletal: Positive for back pain, joint pain and myalgias.  Skin: Negative for rash.  Neurological: Positive for sensory change and weakness.  Psychiatric/Behavioral: The patient has insomnia.   All other systems reviewed and are negative.  Past Medical History:  Diagnosis Date  . History of chicken pox   . Medical history non-contributory    Past Surgical History:  Procedure Laterality Date  . CARPAL TUNNEL RELEASE Bilateral 2009  . COLONOSCOPY W/ POLYPECTOMY    . JOINT REPLACEMENT    . LACERATION REPAIR Right ~ 2012   leg  . LUMBAR LAMINECTOMY/DECOMPRESSION MICRODISCECTOMY N/A 07/24/2019   Procedure: Decompressive Lumbar Laminectomy Lumbar three-four Lumbar four-five for Epidural Abscess;  Surgeon: Kary Kos, MD;  Location: [facility];  Service: Neurosurgery;  Laterality: N/A;  . TOTAL HIP ARTHROPLASTY Right 01/11/2013   Procedure: TOTAL HIP ARTHROPLASTY;  Surgeon: Garald Balding, MD;  Location: [facility];  Service: Orthopedics;  Laterality: Right;  . TOTAL HIP ARTHROPLASTY Left 10/12/2016   Procedure: TOTAL HIP ARTHROPLASTY;  Surgeon: Garald Balding, MD;  Location: [facility];  Service: Orthopedics;  Laterality: Left;   Family History  Problem Relation Age of Onset  . Cancer Father        Hodgkin's disease  . COPD Mother   . Heart attack Maternal Grandfather 80  . Diabetes Neg Hx   . Stroke Neg Hx   .  Hypertension Neg Hx   . Hyperlipidemia Neg Hx    Social History:  reports that he quit smoking about 20 years ago. His smoking use included cigarettes. He has a 14.00 pack-year smoking history. He has never used smokeless tobacco. He reports current alcohol use of about 24.0 standard drinks of alcohol per week. He reports that he does not use drugs. Admitted drank 2-18 pack of beer per day- denies smoking Allergies:  Allergies  Allergen Reactions  . Bee Venom Anaphylaxis  . Hydrocodone Nausea And Vomiting   Medications Prior to Admission  Medication Sig Dispense Refill  . cyclobenzaprine (FLEXERIL) 5 MG tablet Take 1 tablet (5 mg total) by mouth 3 (three) times daily as needed for muscle spasms. 30 tablet 1  . ibuprofen (ADVIL) 200 MG tablet Take 800 mg by mouth every 8 (eight) hours as needed for moderate pain.    . predniSONE (DELTASONE) 5 MG tablet Take 3 tablets (15 mg total) by mouth daily with breakfast. 90 tablet 0    Drug Regimen Review All medications were reviewed and remains appropriate with no sigificant issues identified  Home:     Functional History:    Functional Status:  Mobility:          ADL:    Cognition: Cognition Orientation Level: Oriented X4    Physical Exam: Blood pressure (!) 142/66, pulse 72, temperature 98.2 F (36.8 C), resp. rate 18, SpO2 96 %. Physical Exam  Nursing note and vitals reviewed. Constitutional: He appears well-developed and well-nourished.  Lying in bed; appropriate, pain when trying to turn to side in bed, NAD  HENT:  Head: Normocephalic and atraumatic.  Mouth/Throat: No oropharyngeal exudate.  No facial asymmetry Tongue midline- no thrush  Eyes: Pupils are equal, round, and reactive to light. EOM are normal.  Neck: No tracheal deviation present.  Cardiovascular:  RRR  Respiratory:  CTA B/L  GI:  abd soft, NT, ND, (+)BS hypoactive  Genitourinary:    Genitourinary Comments: (+) foley in place; amber urine in bag     Musculoskeletal:     Cervical back: Normal range of motion and neck supple.     Comments: UEs- deltoid, biceps, triceps, WE grip and finger abd tested- strength 5/5 in UEs B/L  RLE- HF 2/5, KE 4/5, DF 4/5, PF 4/5, EHL 4/5 LLE- HF 2-/5, KE 2/5, DF 2/5, PF 2/5, EHL 2/5  No swelling and has good ROM of joints in all 4 extremities  Neurological: No cranial nerve deficit.  Patient is alert.  Mood is a bit flat but appropriate.  Oriented x3 and follows commands.  Sensation to Light touch intact in all 4 extremities No increased tone in LEs Has absent LEs DTRs B/L  Skin:  Back incision is dressed with original honeycomb dressing- C/D/I No backside ulcers/skin breakdown seen  Psychiatric:  Appropriate, but flat    Results for orders placed or performed during the hospital encounter of 07/24/19 (from the past 48 hour(s))  C difficile quick scan w PCR reflex     Status: None   Collection Time: 07/31/19  1:17 AM   Specimen: STOOL  Result Value Ref Range   C Diff antigen NEGATIVE NEGATIVE   C Diff toxin NEGATIVE NEGATIVE  C Diff interpretation No C. difficile detected.     Comment: Performed at [facility], [place] [address] [place], [place] [address]   [place] EKG SITE RITE  Result Date: 07/30/2019 If [facility] image not attached, placement could not be confirmed due to current cardiac rhythm.      Medical Problem List and Plan: 1.  Bilateral lower extremity weakness secondary to incomplete cauda equina syndrome/epidural abscess status post decompressive lumbar laminectomies L3-4 4-5 with evacuation of epidural abscess [id number] complicated by lumbar vertebral infection and R psoas abscess s/p evacuation  -On Prednisone 15 mg daily- will need to check with [facility] about the taper plans.   -patient may  Shower if cover back incision  -ELOS/Goals: 2-3 weeks; Mod I 2.  Antithrombotics: -DVT/anticoagulation: SCDs.  Check vascular. -will see if can start Lovenox in setting of new SCI  patient.   -antiplatelet therapy: N/A 3. Pain Management: Oxycodone and Flexeril as needed 4. Mood: Provide emotional support  -antipsychotic agents: N/A 5. Neuropsych: This patient is capable of making decisions on his own behalf. 6. Skin/Wound Care: Routine skin checks 7. Fluids/Electrolytes/Nutrition: Routine in and outs with follow-up chemistries 8.  ID/MSSA bacteremia complicated by lumbar vertebral infection.  Plan is for 6 weeks of IV nafcillin initiated 07/25/2019- last day 09/04/19 per ID.  9.  Urinary retention due to neurogenic bladder.  Start Flomax 0.4 mg QHS and then wait x 2-3 days, THEN remove Foley- will unlikely be able to void if removes foley immediately.  10. Insomnia- will order Trazodone 25-50 mg QHS prn for sleep. 11. Questionable neurogenic bowel-  Will monitor closely for bowel incontinence and possible "constipation" which could be due to neurogenic bowel.  12. Hypokalemia- was last 3.0-Don't see any evident that it was repleted- will give 1 dose of 40 mEq of Kcl- and recheck in AM  13. Alcohol Abuse- pt counseled- doesn't agree with our assessment.   Silvestre Mesi, PA   I have personally performed a face to face diagnostic evaluation of this patient and formulated the key components of the plan.  Additionally, I have personally reviewed laboratory data, imaging studies, as well as relevant notes and concur with the physician assistant's documentation above.   The patient's status has not changed from the original H&P.  Any changes in documentation from the acute care chart have been noted above.     Courtney Heys, MD 07/31/2019

## 2019-07-31 NOTE — Progress Notes (Signed)
Courtney Heys, MD  Physician  Physical Medicine and Rehabilitation  PMR Pre-admission  Signed  Date of Service:  07/31/2019 10:11 AM      Related encounter: ED to Hosp-Admission (Discharged) from 07/24/2019 in [facility]        Show:Clear all _0 Manual_1 Template_2 Copied  Added by: _3 Cristina Gong, RN_4 Courtney Heys, MD  _5 Hover for details PMR Admission Coordinator Pre-Admission Assessment   Patient: [name] is an 54 y.o., male MRN: [id number] DOB: 07/09/1966 Height: _6  (185.4 cm) Weight: 109.9 kg   Insurance Information HMO:     PPO: yes     PCP:      IPA:      80/20:      OTHER:  PRIMARY: [employer] [place]      Policy#: [id number]      Subscriber: pt CM Name: Santiago Glad      Phone#: [phone]     Fax#: [phone] Pre-Cert#: [id number] approved until 1/18 when updates are due      Employer: [employer] Pt's DOB is incorrect with BCBS. Patient and I spoke with HR, Hinton Dyer, who is to have DOB corrected. Her phone number is [phone] Benefits:  Phone #: [phone]     Name: 07/30/2019 Eff. Date: 05/27/2019     Deduct: $5000      Out of Pocket Max: $6000 includes deductible      Life Max: none CIR: 70%      SNF: 70% 60 days per year Outpatient: $70 per visit     Co-Pay: 30 visits combined PT and OT Home Health: 70%      Co-Pay: limited by medical neccesity DME: 70%     Co-Pay: 30% Providers: in network  SECONDARY: none        Medicaid Application Date:       Case Manager:  Disability Application Date:       Case Worker:    The "Data Collection Information Summary" for patients in Inpatient Rehabilitation Facilities with attached "Privacy Act [place] Records" was provided and verbally reviewed with: N/A   Emergency Contact Information         Contact Information     Name Relation Home Work [place] Daughter [phone]             Current Medical History  Patient Admitting Diagnosis:  epidural abscess    History of Present Illness: 54 year old right-handed male with history of remote tobacco abuse, right total hip arthroplasty 01/11/2013 and left total hip arthroplasty 10/12/2016.  Presented 07/25/2019 with progressive weakness lower extremities and back pain as well as left foot drop with urinary retention.  MRI of lumbar spine showed sizable epidural abscess L3-4 4-5 but also extensive amount of abscess in the psoas muscle.  Admission labs showed urine drug screen negative, urine culture greater 100,000 Staph aureus, sodium 128, potassium 3.4, WBC 19,900, sedimentation rate 114.  Recent CT abdomen pelvis with contrast showed no acute findings.  Underwent decompressive lumbar laminectomies L3-4 4-5 for evacuation of epidural abscess with partial medial facetectomies and foraminotomies 07/25/2019 per Dr. Saintclair Halsted.  Slow Decadron taper.  Underwent CT aspiration of right psoas abscess per interventional radiology 07/25/2019 showing MSSA bacteremia..  Echocardiogram with ejection fraction of 60% no vegetation noted.  Patient currently maintained on nafcillin 2 g every 4 hours per infectious disease for bacteremia.  Hospital course pain management as well as routine back precautions.  Patient is maintained on  a regular diet.    Patient's medical record from [facility] has been reviewed by the rehabilitation admission coordinator and physician.   Past Medical History      Past Medical History:  Diagnosis Date  . History of chicken pox    . Medical history non-contributory        Family History   family history includes COPD in his mother; Cancer in his father; Heart attack (age of onset: 73) in his maternal grandfather.   Prior Rehab/Hospitalizations Has the patient had prior rehab or hospitalizations prior to admission? Yes   Has the patient had major surgery during 100 days prior to admission? Yes              Current Medications   Current Facility-Administered  Medications:  .  0.9 %  sodium chloride infusion, 250 mL, Intravenous, Continuous, Kary Kos, MD, Last Rate: 1 mL/hr at 07/26/19 2124, 250 mL at 07/26/19 2124 .  acetaminophen (TYLENOL) tablet 650 mg, 650 mg, Oral, Q4H PRN, 650 mg at 07/30/19 2127 **OR** acetaminophen (TYLENOL) suppository 650 mg, 650 mg, Rectal, Q4H PRN, Kary Kos, MD, 650 mg at 07/26/19 8159 .  alum & mag hydroxide-simeth (MAALOX/MYLANTA) 200-200-20 MG/5ML suspension 30 mL, 30 mL, Oral, Q6H PRN, Kary Kos, MD .  Chlorhexidine Gluconate Cloth 2 % PADS 6 each, 6 each, Topical, Daily, Kary Kos, MD, 6 each at 07/30/19 1000 .  cyclobenzaprine (FLEXERIL) tablet 10 mg, 10 mg, Oral, TID PRN, Kary Kos, MD, 10 mg at 07/30/19 1144 .  HYDROmorphone (DILAUDID) injection 0.5 mg, 0.5 mg, Intravenous, Q2H PRN, Kary Kos, MD .  ibuprofen (ADVIL) tablet 800 mg, 800 mg, Oral, Q8H PRN, Kary Kos, MD .  menthol-cetylpyridinium (CEPACOL) lozenge 3 mg, 1 lozenge, Oral, PRN **OR** phenol (CHLORASEPTIC) mouth spray 1 spray, 1 spray, Mouth/Throat, PRN, Kary Kos, MD .  nafcillin 2 g in sodium chloride 0.9 % 100 mL IVPB, 2 g, Intravenous, Q4H, Kary Kos, MD, Last Rate: 200 mL/hr at 07/31/19 0808, 2 g at 07/31/19 0808 .  ondansetron (ZOFRAN) tablet 4 mg, 4 mg, Oral, Q6H PRN **OR** ondansetron (ZOFRAN) injection 4 mg, 4 mg, Intravenous, Q6H PRN, Kary Kos, MD .  oxyCODONE (Oxy IR/ROXICODONE) immediate release tablet 10 mg, 10 mg, Oral, Q3H PRN, Kary Kos, MD, 10 mg at 07/30/19 1144 .  pantoprazole (PROTONIX) EC tablet 40 mg, 40 mg, Oral, QHS, Karren Cobble, RPH, 40 mg at 07/30/19 2127 .  predniSONE (DELTASONE) tablet 15 mg, 15 mg, Oral, Q breakfast, Kary Kos, MD, 15 mg at 07/31/19 4707 .  sodium chloride flush (NS) 0.9 % injection 10-40 mL, 10-40 mL, Intracatheter, Q12H, Kary Kos, MD, 10 mL at 07/31/19 0813 .  sodium chloride flush (NS) 0.9 % injection 10-40 mL, 10-40 mL, Intracatheter, PRN, Kary Kos, MD .  sodium chloride flush (NS)  0.9 % injection 10-40 mL, 10-40 mL, Intracatheter, Q12H, Kary Kos, MD .  sodium chloride flush (NS) 0.9 % injection 10-40 mL, 10-40 mL, Intracatheter, PRN, Kary Kos, MD .  sodium chloride flush (NS) 0.9 % injection 3 mL, 3 mL, Intravenous, Q12H, Kary Kos, MD, 3 mL at 07/31/19 0814 .  sodium chloride flush (NS) 0.9 % injection 3 mL, 3 mL, Intravenous, PRN, Kary Kos, MD .  thiamine tablet 100 mg, 100 mg, Oral, Daily, 100 mg at 07/31/19 0808 **OR** [DISCONTINUED] thiamine (B-1) injection 100 mg, 100 mg, Intravenous, Daily, Joy, Shawn C, PA-C, 100 mg at 07/25/19 0919   Patients Current Diet:  Diet Order                      Diet - low sodium heart healthy           Diet regular Room service appropriate? Yes; Fluid consistency: Thin  Diet effective now                   Precautions / Restrictions Precautions Precautions: Back, Fall Precaution Booklet Issued: Yes (comment) Precaution Comments: pt re-educated on precautions Restrictions Weight Bearing Restrictions: No    Has the patient had 2 or more falls or a fall with injury in the past year? No   Prior Activity Level Limited Community (1-2x/wk): totally independent prior to 3 to 4 weeks pta; works as a Agricultural engineer Care: Did the patient need help bathing, dressing, using the toilet or eating? Independent   Indoor Mobility: Did the patient need assistance with walking from room to room (with or without device)? Independent   Stairs: Did the patient need assistance with internal or external stairs (with or without device)? Independent   Functional Cognition: Did the patient need help planning regular tasks such as shopping or remembering to take medications? [place] / [place] Devices/Equipment: Cane (specify quad or straight), Walker (specify type)   Prior Device Use: Indicate devices/aids used by the patient prior to current illness,  exacerbation or injury? None of the above   Current Functional Level Cognition   Overall Cognitive Status: Within Functional Limits for tasks assessed Current Attention Level: Selective Orientation Level: Oriented X4 Following Commands: Follows one step commands with increased time, Follows one step commands inconsistently Safety/Judgement: Decreased awareness of safety, Decreased awareness of deficits General Comments: Per last session, following commands, alert and oriented, but does not remember getting OOB    Extremity Assessment (includes Sensation/Coordination)   Upper Extremity Assessment: Generalized weakness  Lower Extremity Assessment: Generalized weakness, RLE deficits/detail RLE Deficits / Details: unable to take steps with RLE     ADLs   Overall ADL's : Needs assistance/impaired Eating/Feeding: NPO Grooming: Set up, Sitting Upper Body Bathing: Moderate assistance, Sitting Lower Body Bathing: Maximal assistance, +2 for physical assistance, +2 for safety/equipment, Cueing for safety, Cueing for sequencing, Sitting/lateral leans, Sit to/from stand Upper Body Dressing : Moderate assistance, Sitting Lower Body Dressing: Maximal assistance, Cueing for safety, Sitting/lateral leans, Sit to/from stand Toilet Transfer: Maximal assistance, Total assistance, +2 for physical assistance, +2 for safety/equipment, Cueing for safety Toilet Transfer Details (indicate cue type and reason): unable to attempt as pt unsafely leaning forward Toileting- Clothing Manipulation and Hygiene: Maximal assistance, Total assistance, +2 for physical assistance, +2 for safety/equipment, Cueing for sequencing, Sitting/lateral lean, Sit to/from stand Functional mobility during ADLs: Moderate assistance, Maximal assistance, +2 for physical assistance, +2 for safety/equipment, Rolling walker, Cueing for sequencing, Cueing for safety General ADL Comments: Pt limited by decreased strength, decreased mobility and  decreased ability to care for self.     Mobility   Overal bed mobility: Needs Assistance Bed Mobility: Rolling, Sidelying to Sit Rolling: Mod assist Sidelying to sit: Max assist, +2 for physical assistance Sit to supine: Max assist, Total assist, +2 for physical assistance, +2 for safety/equipment General bed mobility comments: max multimodal directional cues to complete task, modA for R LE mangement due to pain and weakness, maxAX2 for trunk elevation and LE management off bed. pt with increased pain however diminished upon sitting but remains constant  per pt report     Transfers   Overall transfer level: Needs assistance Equipment used: Rolling walker (2 wheeled) Transfers: Sit to/from Stand, [employer] Transfers Sit to Stand: Mod assist, +2 physical assistance Stand pivot transfers: Mod assist, +2 physical assistance General transfer comment: Requiring RW and OTR for UB Support; PT assisting with sequence of RLE movement by lifting it for him and moving RW     Ambulation / Gait / Stairs / Wheelchair Mobility   Ambulation/Gait General Gait Details: limited by pain to stand pvt transfer to chair     Posture / Balance Dynamic Sitting Balance Sitting balance - Comments: able to sit EOB without PT support ~1 minute, posterior leaning with pain Balance Overall balance assessment: Needs assistance Sitting-balance support: Bilateral upper extremity supported, Feet supported Sitting balance-Leahy Scale: Fair Sitting balance - Comments: able to sit EOB without PT support ~1 minute, posterior leaning with pain Postural control: Posterior lean Standing balance support: Bilateral upper extremity supported, During functional activity Standing balance-Leahy Scale: Poor Standing balance comment: reliant on external assist and RW     Special needs/care consideration BiPAP/CPAP  CPM  Continuous Drip IV PICC single lumen placed 11/26/2704 right basilic 44 cm. To be on Nafcillin for 6 weeks  starting from 07/15/2019 Dialysis         Life Vest  Oxygen  Special Bed  Trach Size  Wound Vac  Skin surgical incision Bowel mgmt: continent LBM 07/30/2019. Was having diarrhea so on contact precautions with cdiff pending Bladder mgmt: #16 Fr placed 07/27/2019 Diabetic mgmt:  Behavioral consideration  Chemo/radiation  Designated visitor is daughter, Ander Purpura    Previous Home Environment  Living Arrangements: Alone  Lives With: Alone Available Help at Discharge: Family, Available 24 hours/day(seperated wife, Manuela Schwartz, daughter and dtr in law) Type of Home: House Home Layout: One level Home Access: Stairs to enter Entrance Stairs-Rails: None Entrance Stairs-Number of Steps: 3 Bathroom Shower/Tub: Optometrist: Yes How Accessible: Accessible via walker [place]: No   Discharge Living Setting Plans for Discharge Living Setting: Lives with (comment)(to go stay at wife's home ( seperated for 2 years) and famil) Type of Home at Discharge: House Discharge Home Layout: One level Discharge Home Access: Stairs to enter Entrance Stairs-Rails: None Entrance Stairs-Number of Steps: 4 to 5 Discharge Bathroom Shower/Tub: Tub/shower unit, Curtain Discharge Bathroom Toilet: Standard Discharge Bathroom Accessibility: Yes How Accessible: Accessible via walker Does the patient have any problems obtaining your medications?: No   Social/Family/Support Systems Patient Roles: Spouse, Parent(Mechanic fulltime) Contact Information: daughter, Ander Purpura, main contact Anticipated Caregiver: wife, Manuela Schwartz ( seperated), daughter Ander Purpura and dtr in law Anticipated Caregiver's Contact Information: [phone] Ability/Limitations of Caregiver: family work but will work out a schedule; [name] is a CNA and is aware will need to admninster IV antibiotics Caregiver Availability: 24/7 Discharge Plan Discussed with Primary Caregiver: Yes Is Caregiver In  Agreement with Plan?: Yes Does Caregiver/Family have Issues with Lodging/Transportation while Pt is in Rehab?: No   Goals/Additional Needs Patient/Family Goal for Rehab: supervision to min assist with PT and OT Expected length of stay: ELOS 2 to 3 weeks Special Service Needs: IV antibiotics at d/c per ID recommendations Pt/Family Agrees to Admission and willing to participate: Yes Program Orientation Provided & Reviewed with Pt/Caregiver Including Roles  & Responsibilities: Yes   Decrease burden of Care through IP rehab admission:    Possible need for SNF placement upon discharge:    Patient Condition: I have reviewed  medical records from [facility] , spoken with CM, and patient and daughter. I met with patient at the bedside for inpatient rehabilitation assessment.  Patient will benefit from ongoing PT and OT, can actively participate in 3 hours of therapy a day 5 days of the week, and can make measurable gains during the admission.  Patient will also benefit from the coordinated team approach during an Inpatient Acute Rehabilitation admission.  The patient will receive intensive therapy as well as Rehabilitation physician, nursing, social worker, and care management interventions.  Due to bladder management, bowel management, safety, skin/wound care, disease management, medication administration, pain management and patient education the patient requires 24 hour a day rehabilitation nursing.  The patient is currently mod to max assist with mobility and basic ADLs.  Discharge setting and therapy post discharge at home with home health is anticipated.  Patient has agreed to participate in the Acute Inpatient Rehabilitation Program and will admit today.   Preadmission Screen Completed By:  Cleatrice Burke, 07/31/2019 10:11 AM ______________________________________________________________________   Discussed status with Dr. Dagoberto Ligas on  07/31/2019 at  1024 and received approval for  admission today.   Admission Coordinator:  Cleatrice Burke, RN, time  1024 Date  07/31/2019    Assessment/Plan: Diagnosis: 1. Does the need for close, 24 hr/day Medical supervision in concert with the patient's rehab needs make it unreasonable for this patient to be served in a less intensive setting? Yes 2. Co-Morbidities requiring supervision/potential complications: B/L Hip replacements, hypokalemia, obesity, MSSA bacteremia on Nafcillin 2g IV q4 hours x 6-8 weeks, R psoas abscess s/p aspiration 3. Due to bowel management, safety, skin/wound care, disease management, medication administration, pain management and patient education, does the patient require 24 hr/day rehab nursing? Yes 4. Does the patient require coordinated care of a physician, rehab nurse, PT, OT, and SLP to address physical and functional deficits in the context of the above medical diagnosis(es)? Yes Addressing deficits in the following areas: balance, endurance, locomotion, strength, transferring, bathing, dressing, grooming and toileting 5. Can the patient actively participate in an intensive therapy program of at least 3 hrs of therapy 5 days a week? Yes 6. The potential for patient to make measurable gains while on inpatient rehab is good 7. Anticipated functional outcomes upon discharge from inpatient rehab: supervision and min assist PT, supervision and min assist OT, n/a SLP 8. Estimated rehab length of stay to reach the above functional goals is: 2-3 weeks 9. Anticipated discharge destination: Home 10. Overall Rehab/Functional Prognosis: good     MD Signature:          Revision History

## 2019-08-01 ENCOUNTER — Inpatient Hospital Stay (HOSPITAL_COMMUNITY): Payer: BC Managed Care – PPO

## 2019-08-01 ENCOUNTER — Inpatient Hospital Stay (HOSPITAL_COMMUNITY): Payer: BC Managed Care – PPO | Admitting: Occupational Therapy

## 2019-08-01 DIAGNOSIS — M7989 Other specified soft tissue disorders: Secondary | ICD-10-CM

## 2019-08-01 LAB — COMPREHENSIVE METABOLIC PANEL
ALT: 24 U/L (ref 0–44)
AST: 18 U/L (ref 15–41)
Albumin: 1.8 g/dL — ABNORMAL LOW (ref 3.5–5.0)
Alkaline Phosphatase: 62 U/L (ref 38–126)
Anion gap: 11 (ref 5–15)
BUN: 10 mg/dL (ref 6–20)
CO2: 23 mmol/L (ref 22–32)
Calcium: 8.1 mg/dL — ABNORMAL LOW (ref 8.9–10.3)
Chloride: 104 mmol/L (ref 98–111)
Creatinine, Ser: 0.7 mg/dL (ref 0.61–1.24)
GFR calc Af Amer: >60 mL/min (ref >60)
GFR calc non Af Amer: >60 mL/min (ref >60)
Glucose, Bld: 97 mg/dL (ref 70–99)
Potassium: 3.3 mmol/L — ABNORMAL LOW (ref 3.5–5.1)
Sodium: 138 mmol/L (ref 135–145)
Total Bilirubin: 0.3 mg/dL (ref 0.3–1.2)
Total Protein: 6.1 g/dL — ABNORMAL LOW (ref 6.5–8.1)

## 2019-08-01 LAB — CULTURE, BLOOD (ROUTINE X 2)
Culture: NO GROWTH
Culture: NO GROWTH

## 2019-08-01 LAB — CBC WITH DIFFERENTIAL/PLATELET
Abs Immature Granulocytes: 0.11 10*3/uL — ABNORMAL HIGH (ref 0.00–0.07)
Basophils Absolute: 0.1 10*3/uL (ref 0.0–0.1)
Basophils Relative: 1 %
Eosinophils Absolute: 0.2 10*3/uL (ref 0.0–0.5)
Eosinophils Relative: 3 %
HCT: 35.1 % — ABNORMAL LOW (ref 39.0–52.0)
Hemoglobin: 11.7 g/dL — ABNORMAL LOW (ref 13.0–17.0)
Immature Granulocytes: 1 %
Lymphocytes Relative: 15 %
Lymphs Abs: 1.5 10*3/uL (ref 0.7–4.0)
MCH: 32.5 pg (ref 26.0–34.0)
MCHC: 33.3 g/dL (ref 30.0–36.0)
MCV: 97.5 fL (ref 80.0–100.0)
Monocytes Absolute: 0.5 10*3/uL (ref 0.1–1.0)
Monocytes Relative: 5 %
Neutro Abs: 7.2 10*3/uL (ref 1.7–7.7)
Neutrophils Relative %: 75 %
Platelets: 391 10*3/uL (ref 150–400)
RBC: 3.6 MIL/uL — ABNORMAL LOW (ref 4.22–5.81)
RDW: 13.4 % (ref 11.5–15.5)
WBC: 9.5 10*3/uL (ref 4.0–10.5)
nRBC: 0 % (ref 0.0–0.2)

## 2019-08-01 MED ORDER — SODIUM CHLORIDE 0.9% FLUSH
10.0000 mL | INTRAVENOUS | Status: DC | PRN
  Administered 2019-08-10: 10 mL

## 2019-08-01 MED ORDER — ADULT MULTIVITAMIN W/MINERALS CH
1.0000 | ORAL_TABLET | Freq: Every day | ORAL | Status: DC
  Administered 2019-08-02 – 2019-08-10 (×9): 1 via ORAL
  Filled 2019-08-01 (×9): qty 1

## 2019-08-01 MED ORDER — ENOXAPARIN SODIUM 40 MG/0.4ML ~~LOC~~ SOLN
40.0000 mg | SUBCUTANEOUS | Status: DC
  Administered 2019-08-01 – 2019-08-10 (×10): 40 mg via SUBCUTANEOUS
  Filled 2019-08-01 (×10): qty 0.4

## 2019-08-01 MED ORDER — POTASSIUM CHLORIDE CRYS ER 20 MEQ PO TBCR
40.0000 meq | EXTENDED_RELEASE_TABLET | Freq: Two times a day (BID) | ORAL | Status: AC
  Administered 2019-08-01 (×2): 40 meq via ORAL
  Filled 2019-08-01 (×2): qty 2

## 2019-08-01 MED ORDER — CHLORHEXIDINE GLUCONATE CLOTH 2 % EX PADS
6.0000 | MEDICATED_PAD | Freq: Two times a day (BID) | CUTANEOUS | Status: DC
  Administered 2019-08-01 – 2019-08-10 (×14): 6 via TOPICAL

## 2019-08-01 MED ORDER — ENSURE ENLIVE PO LIQD
237.0000 mL | Freq: Two times a day (BID) | ORAL | Status: DC
  Administered 2019-08-01 – 2019-08-02 (×2): 237 mL via ORAL

## 2019-08-01 NOTE — Progress Notes (Signed)
[facility] PHYSICAL MEDICINE & REHABILITATION PROGRESS NOTE   Subjective/Complaints: Pt reports slept from 1-5 am- noise better than acute hospital, so slept better, but still not great- will try Trazodone tonight- is ordered prn.   ROS; pt denies SOB, CP, Abd pain, HA, vision changes, N/V/D/Constipation.  Objective:   No results found. Recent Labs    08/01/19 0530  WBC 9.5  HGB 11.7*  HCT 35.1*  PLT 391   Recent Labs    08/01/19 0530  NA 138  K 3.3*  CL 104  CO2 23  GLUCOSE 97  BUN 10  CREATININE 0.70  CALCIUM 8.1*    Intake/Output Summary (Last 24 hours) at 08/01/2019 0951 Last data filed at 08/01/2019 0730 Gross per 24 hour  Intake 161.6 ml  Output 1075 ml  Net -913.4 ml     Physical Exam: Vital Signs Blood pressure (!) 142/66, pulse 72, temperature 98.2 F (36.8 C), resp. rate 18, SpO2 96 %.  Nursing note and vitals reviewed. Constitutional: He appears well-developed and well-nourished.  Lying in bed; appropriate, NAD HENT:  Head: Normocephalic and atraumatic.  Mouth/Throat: No oropharyngeal exudate.   Eyes:  EOM are normal.  Neck: No tracheal deviation present.  Cardiovascular: RRR  Respiratory: CTA B/L  GI: abd soft, NT, ND, (+)BS hypoactive  Genitourinary:    Genitourinary Comments: (+) foley in place; amber urine in bag   Musculoskeletal:     Cervical back: Normal range of motion and neck supple.     Comments: UEs- deltoid, biceps, triceps, WE grip and finger abd tested- strength 5/5 in UEs B/L  RLE- HF 2/5, KE 4/5, DF 4/5, PF 4/5, EHL 4/5 LLE- HF 2-/5, KE 2/5, DF 2/5, PF 2/5, EHL 2/5  No swelling and has good ROM of joints in all 4 extremities  Neurological: No cranial nerve deficit.  Patient is alert.  Mood is a bit flat but appropriate.  Oriented x3 and follows commands. Sensation to Light touch intact in all 4 extremities No increased tone in LEs Has absent LEs DTRs B/L  Skin:  Back incision is dressed with original honeycomb dressing-  C/D/I No backside ulcers/skin breakdown seen  Psychiatric:  Appropriate, but flat    Assessment/Plan: 1. Functional deficits secondary to Incomplete cauda equina syndrome due to epidural abscess which require 3+ hours per day of interdisciplinary therapy in a comprehensive inpatient rehab setting.  Physiatrist is providing close team supervision and 24 hour management of active medical problems listed below.  Physiatrist and rehab team continue to assess barriers to discharge/monitor patient progress toward functional and medical goals  Care Tool:  Bathing              Bathing assist       Upper Body Dressing/Undressing Upper body dressing        Upper body assist      Lower Body Dressing/Undressing Lower body dressing            Lower body assist       Toileting Toileting    Toileting assist       Transfers Chair/bed transfer  Transfers assist           Locomotion Ambulation   Ambulation assist              Walk 10 feet activity   Assist           Walk 50 feet activity   Assist           Walk  150 feet activity   Assist           Walk 10 feet on uneven surface  activity   Assist           Wheelchair     Assist               Wheelchair 50 feet with 2 turns activity    Assist            Wheelchair 150 feet activity     Assist          Blood pressure (!) 142/66, pulse 72, temperature 98.2 F (36.8 C), resp. rate 18, SpO2 96 %.  Medical Problem List and Plan: 1.  Bilateral lower extremity weakness secondary to incomplete cauda equina syndrome/epidural abscess status post decompressive lumbar laminectomies L3-4 4-5 with evacuation of epidural abscess [id number] complicated by lumbar vertebral infection and R psoas abscess s/p evacuation             -On Prednisone 15 mg daily- will need to check with [facility] about the taper plans.              -patient may  Shower if cover back  incision             -ELOS/Goals: 2-3 weeks; Mod I 2.  Antithrombotics: -DVT/anticoagulation: SCDs.  Check vascular. -will see if can start Lovenox in setting of new SCI patient.              -antiplatelet therapy: N/A 3. Pain Management: Oxycodone and Flexeril as needed 4. Mood: Provide emotional support             -antipsychotic agents: N/A 5. Neuropsych: This patient is capable of making decisions on his own behalf. 6. Skin/Wound Care: Routine skin checks 7. Fluids/Electrolytes/Nutrition: Routine in and outs with follow-up chemistries 8.  ID/MSSA bacteremia complicated by lumbar vertebral infection.  Plan is for 6 weeks of IV nafcillin initiated 07/25/2019- last day 09/04/19 per ID.  9.  Urinary retention due to neurogenic bladder.  Start Flomax 0.4 mg QHS and then wait x 2-3 days, THEN remove Foley- will unlikely be able to void if removes foley immediately.  10. Insomnia- will order Trazodone 25-50 mg QHS prn for sleep.  1/6- didn't take last night- will ask for tonight 11. Questionable neurogenic bowel-  Will monitor closely for bowel incontinence and possible "constipation" which could be due to neurogenic bowel.  12. Hypokalemia- was last 3.0-Don't see any evident that it was repleted- will give 1 dose of 40 mEq of Kcl- and recheck in AM   1/6- K+ 3.3- will replete 1 more time with 40 mEq x2 total. 13. Alcohol Abuse- pt counseled- doesn't agree with our assessment/dx      LOS: 1 days A FACE TO FACE EVALUATION WAS PERFORMED  Ingra Rother 08/01/2019, 9:51 AM

## 2019-08-01 NOTE — Progress Notes (Signed)
Spoke with primary RN, will remove LUA midline,no longer needed.Patient has right SL PICC.

## 2019-08-01 NOTE — Evaluation (Signed)
Physical Therapy Assessment and Plan  Patient Details  Name: [name] MRN: [id number] Date of Birth: [birth year]  PT Diagnosis: Abnormal posture, Abnormality of gait, Difficulty walking, Dizziness and giddiness, Edema, Low back pain, Muscle spasms, Muscle weakness, Pain in lumbar spine and Paralysis Rehab Potential: Good ELOS: 2-3 weeks   Today's Date: 08/01/2019 PT Individual Time: 0800-0900 PT Individual Time Calculation (min): 60 min    Problem List:  Patient Active Problem List   Diagnosis Date Noted  . Cauda equina syndrome ([place]) 07/31/2019  . Neurogenic bladder 07/31/2019  . Bacteremia due to methicillin susceptible Staphylococcus aureus (MSSA) 07/30/2019  . Lumbar discitis 07/30/2019  . Cerebral septic emboli ([place]) 07/30/2019  . S/P lumbar laminectomy 07/25/2019  . Epidural abscess, L2-L5 07/25/2019  . Polymyalgia rheumatica ([place]) 07/24/2019  . Acute bilateral low back pain without sciatica 07/18/2019  . Abdominal pain 07/18/2019  . Anemia 07/18/2019  . Obesity (BMI 35.0-39.9 without comorbidity) 07/18/2019  . Constipation 07/18/2019  . Bilateral leg pain 07/12/2019  . Bilateral leg edema 04/01/2017  . Elevated blood sugar 04/01/2017  . Avascular necrosis of bone of hip, left ([place]) 10/12/2016  . Alcohol abuse, daily use 10/12/2016  . S/P total hip arthroplasty 10/12/2016    Past Medical History:  Past Medical History:  Diagnosis Date  . History of chicken pox   . Medical history non-contributory    Past Surgical History:  Past Surgical History:  Procedure Laterality Date  . CARPAL TUNNEL RELEASE Bilateral 2009  . COLONOSCOPY W/ POLYPECTOMY    . JOINT REPLACEMENT    . LACERATION REPAIR Right ~ 2012   leg  . LUMBAR LAMINECTOMY/DECOMPRESSION MICRODISCECTOMY N/A 07/24/2019   Procedure: Decompressive Lumbar Laminectomy Lumbar three-four Lumbar four-five for Epidural Abscess;  Surgeon: Kary Kos, MD;  Location: [facility];  Service: Neurosurgery;  Laterality: N/A;   . TOTAL HIP ARTHROPLASTY Right 01/11/2013   Procedure: TOTAL HIP ARTHROPLASTY;  Surgeon: Garald Balding, MD;  Location: [facility];  Service: Orthopedics;  Laterality: Right;  . TOTAL HIP ARTHROPLASTY Left 10/12/2016   Procedure: TOTAL HIP ARTHROPLASTY;  Surgeon: Garald Balding, MD;  Location: [facility];  Service: Orthopedics;  Laterality: Left;    Assessment & Plan Clinical Impression: Patient is a 54 y.o. year old right-handed male with history of remote tobacco abuse, right total hip arthroplasty 01/11/2013 and left total hip arthroplasty 10/12/2016.  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  He has a daughter and son in the area.  Presented 07/25/2019 with progressive weakness lower extremities and back pain as well as left foot drop with urinary retention.  MRI of lumbar spine showed sizable epidural abscess L3-4 4-5 but also extensive amount of abscess in the psoas muscle.  Admission labs showed urine drug screen negative, urine culture greater 100,000 Staph aureus, sodium 128, potassium 3.4, WBC 19,900, sedimentation rate 114.  Recent CT abdomen pelvis with contrast showed no acute findings.  Underwent decompressive lumbar laminectomies L3-4 4-5 for evacuation of epidural abscess with partial medial facetectomies and foraminotomies 07/25/2019 per Dr. Saintclair Halsted.  Slow Decadron taper.  Underwent CT aspiration of right psoas abscess per interventional radiology 07/25/2019 showing MSSA bacteremia..  Echocardiogram with ejection fraction of 60% no vegetation noted.  Patient currently maintained on nafcillin 2 g every 4 hours per infectious disease for bacteremia.  Hospital course pain management as well as routine back precautions.  Patient is maintained on a regular diet.  Therapy evaluations completed and patient was  admitted for a comprehensive rehab program.  Patient transferred to [facility] on 07/31/2019 .   Patient currently requires total with mobility secondary to  muscle weakness and muscle paralysis, decreased cardiorespiratoy endurance, decreased coordination and decreased motor planning and decreased sitting balance, decreased standing balance, decreased postural control, decreased balance strategies and difficulty maintaining precautions.  Prior to hospitalization, patient was independent  with mobility and lived with Alone, Other (Comment)(plans to go to ex-wifes home with support of daughter and daughter in law) in a House home.  Home access is 2-3Stairs to enter.  Patient will benefit from skilled PT intervention to maximize safe functional mobility, minimize fall risk and decrease caregiver burden for planned discharge home with 24 hour assist.  Anticipate patient will benefit from follow up [facility] at discharge.  PT - End of Session Activity Tolerance: Tolerates 30+ min activity with multiple rests Endurance Deficit: Yes Endurance Deficit Description: Requires multiple rest breaks with minimal activity due to pain/fatigue PT Assessment Rehab Potential (ACUTE/IP ONLY): Good PT Barriers to Discharge: Home environment access/layout;Neurogenic Bowel & Bladder PT Barriers to Discharge Comments: 2-3 STE without rails to ex-wife's home PT Patient demonstrates impairments in the following area(s): Balance;Edema;Endurance;Motor;Pain;Perception;Safety;Sensory;Skin Integrity PT Transfers Functional Problem(s): Bed Mobility;Bed to Chair;Furniture;Car PT Locomotion Functional Problem(s): Ambulation;Wheelchair Mobility;Stairs PT Plan PT Intensity: Minimum of 1-2 x/day ,45 to 90 minutes PT Frequency: 5 out of 7 days PT Duration Estimated Length of Stay: 2-3 weeks PT Treatment/Interventions: Ambulation/gait training;Discharge planning;Functional mobility training;Psychosocial support;Therapeutic Activities;Balance/vestibular training;Disease management/prevention;Neuromuscular re-education;Skin care/wound management;Therapeutic Exercise;Wheelchair  propulsion/positioning;DME/adaptive equipment instruction;Pain management;Splinting/orthotics;UE/LE Strength taining/ROM;Community reintegration;Functional electrical stimulation;Patient/family education;Stair training;UE/LE Coordination activities PT Transfers Anticipated Outcome(s): CGA using LRAD PT Locomotion Anticipated Outcome(s): CGA using LRAD 50' PT Recommendation Recommendations for Other Services: Neuropsych consult;Therapeutic Recreation consult Therapeutic Recreation Interventions: Stress management Follow Up Recommendations: Home health PT Patient destination: Home Equipment Recommended: To be determined Equipment Details: Patient has RW, will continue to assess DME needs based on patient's functional progress  Skilled Therapeutic Intervention In addition to the PT evaluation below, the patient performed the following skilled PT interventions: Session 1: Patient in bed upon PT arrival. Patient alert and agreeable to PT session. Patient unable to recall spinal precautions, PT educated on spinal precautions using teach-back method. Patient reported 12/02/08 back pain during session, RN aware and patient was pre-medicated prior to session. PT provided repositioning, rest breaks, cues for diaphramatic breathing, and distraction as pain interventions throughout session.   Therapeutic Activity: Bed Mobility: Patient performed rolling R/L with min A for bending opposite LE up and trunk support grabbing EOB. He performed supine to sit with mod-max A for LE management and trunk support. Patient reported dizziness in sitting BP 143/97, HR 105.  Transfers: Patient performed sit to/from stand x1 with mod A using a RW with bed elevated. Provided verbal cues for hand placement and reaching back to sit. Patient reported 10/10 back pain in standing and quickly requested to sit back down. Patient then performed a slide board transfer with CGA and total A for board placement, patient denied pain during  transfer.  Wheelchair Mobility:  Patient attempted to propelled wheelchair however, was unable to tolerate propulsion due to increased back pain.   Patient in w/c in room at end of session with breaks locked, chair alarm set, and all needs within reach.   Session 2: Patient in bed with his daughter in the room upon PT arrival. Patient alert and agreeable to PT session. Patient denied pain when lying at rest, but reported 10/10 back  pain with mobility during session, RN made aware and patient was pre-medicated prior to session. PT provided repositioning, rest breaks, and distraction as pain interventions throughout session. Attempted to don TED hose during session. TEDs provided were too small and the appropriate size were not available in [employer], RN made aware and ordering the appropriate size for the patient.  Vitals in supine: BP 122/67, HR 90.  Therapeutic Activity: Bed Mobility: Patient performed supine to/from sit with mod A for LE and trunk support. Provided verbal cues for performing log rolling to maintain spinal precautions, bending his opposite LE up to push with hips and shoulders rolling together, and pushing up to his elbow then hand as his LEs were lowered to the floor. Patient reported mild dizziness in sitting: BP 163/115, HR 107. Transfers: Patient performed sit to/from stand x1 with mod A using a RW with bed elevated. Provided verbal cues for hand placement and maintaining a straight back to maintain spinal precautions. Patient tolerated 2 min in standing, reported LE pain/weakness, however, did manage to stand without UE support for 1-2 sec and performed mini squats x5 with increased UE support. Patient declined transferring to the w/c, stating that he was very sore after sitting in the w/c this morning and his pain had increased with standing. Patient returned to bed to perform LE exercise.   Therapeutic Exercise: Patient performed the following exercises with verbal and  tactile cues for proper technique. -heel slides with AAROM x10 -ankle pumps x10 -glut sets x10 with 5 sec hold  Patient reported increased pain and requested to end session early due to pain, RN aware. Patient missed 35 min of skilled PT due to back pain.   Patient in bed with his daughter at bedside at end of session with breaks locked, bed alarm set, and all needs within reach.   Instructed pt in results of PT evaluation as detailed below, PT POC, rehab potential, rehab goals, and discharge recommendations. Additionally discussed CIR's policies regarding fall safety and use of chair alarm and/or quick release belt. Pt and his daughter verbalized understanding and in agreement.     PT Evaluation Precautions/Restrictions Precautions Precautions: Back;Fall Precaution Comments: pt re-educated on precautions Restrictions Weight Bearing Restrictions: No General   Vital Signs Pain Pain Assessment Pain Scale: 0-10 Pain Score: 10-Worst pain ever Pain Type: Acute pain Pain Location: Back Pain Orientation: Lower Pain Descriptors / Indicators: Aching;Throbbing;Tightness Pain Frequency: Constant Pain Onset: On-going Pain Intervention(s): Repositioned;RN made aware Home Living/Prior Functioning Home Living Available Help at Discharge: Family;Available 24 hours/day Type of Home: House Home Access: Stairs to enter [employer] of Steps: 2-3 Entrance Stairs-Rails: None Home Layout: One level Bathroom Shower/Tub: Tub/shower unit;Walk-in shower;Door [employer] Toilet: Standard Bathroom Accessibility: Yes  Lives With: Alone;Other (Comment)(plans to go to ex-wifes home with support of daughter and daughter in law) Prior Function Level of Independence: Independent with basic ADLs;Independent with gait;Independent with homemaking with ambulation;Independent with transfers  Able to Take Stairs?: Yes Driving: Yes Vocation: Full time employment Vocation Requirements:  Mechanic Comments: was Independent prior to 3 to 4 weeks PTA Vision/Perception  Perception Perception: Within Functional Limits Praxis Praxis: Intact  Cognition Overall Cognitive Status: Within Functional Limits for tasks assessed Arousal/Alertness: Awake/alert Attention: Focused;Sustained Focused Attention: Appears intact Sustained Attention: Appears intact Memory: Appears intact Immediate Memory Recall: Sock;Blue;Bed Memory Recall Sock: Without Cue Memory Recall Blue: Without Cue Memory Recall Bed: Without Cue Awareness: Appears intact Problem Solving: Appears intact Safety/Judgment: Appears intact Comments: Did comment that he will  need to go back to work next week, however able to understand that this would be unrealistic based on his current level of function Sensation Sensation Additional Comments: UB sensation intact Coordination Fine Motor Movements are Fluid and Coordinated: Yes Finger Nose Finger Test: [facility] Motor  Motor Motor: Paraplegia Motor - Skilled Clinical Observations: LE weakness and limited by sever back pain  Mobility Bed Mobility Bed Mobility: Rolling Right;Rolling Left;Sit to Sidelying Right Rolling Right: Minimal Assistance - Patient > 75% Rolling Left: Minimal Assistance - Patient > 75% Sit to Sidelying Right: Total Assistance - Patient < 25%(Per OT evaluation.) Transfers Transfers: Lateral/Scoot Transfers Sit to Stand: (Unable without skilled intervention.) Lateral/Scoot Transfers: (Unable without skilled intervention.) Transfer (Assistive device): Other (Comment)(transfer board) Locomotion  Gait Ambulation: No Gait Gait: No Stairs / Additional Locomotion Stairs: No  Trunk/Postural Assessment  Cervical Assessment Cervical Assessment: Within Functional Limits Thoracic Assessment Thoracic Assessment: Within Functional Limits Lumbar Assessment Lumbar Assessment: Within Functional Limits Postural Control Postural Control: Deficits on  evaluation(delayed/decreased)  Balance Balance Balance Assessed: Yes Static Sitting Balance Static Sitting - Balance Support: Left upper extremity supported;Right upper extremity supported;Feet supported Static Sitting - Level of Assistance: 5: Stand by assistance Dynamic Sitting Balance Dynamic Sitting - Balance Support: Bilateral upper extremity supported Dynamic Sitting - Level of Assistance: 4: Min assist Dynamic Sitting - Balance Activities: Lateral lean/weight shifting;Forward lean/weight shifting Sitting balance - Comments: pain limiting tolerance for unsupported sitting this am Static Standing Balance Static Standing - Balance Support: Bilateral upper extremity supported;During functional activity Static Standing - Level of Assistance: 3: Mod assist Extremity Assessment  RUE Assessment RUE Assessment: Within Functional Limits LUE Assessment LUE Assessment: Within Functional Limits RLE Assessment RLE Assessment: Exceptions to [facility] Active Range of Motion (AROM) Comments: Limited hip flexion in sitting and supine due to back pain General Strength Comments: Unable to formally assess due to back pain with LE movement, unable to perform SLR due to pain, grossly at least 3/5 throughout with functional mobiltiy LLE Assessment LLE Assessment: Exceptions to [facility] Active Range of Motion (AROM) Comments: Limited hip flexion in sitting and supine due to back pain General Strength Comments: Unable to formally assess due to back pain with LE movement, unable to perform SLR due to pain, grossly at least 3/5 throughout with functional mobiltiy    Refer to Care Plan for Long Term Goals  Recommendations for other services: Neuropsych and Therapeutic Recreation  Stress management  Discharge Criteria: Patient will be discharged from PT if patient refuses treatment 3 consecutive times without medical reason, if treatment goals not met, if there is a change in medical status, if patient makes no  progress towards goals or if patient is discharged from hospital.  The above assessment, treatment plan, treatment alternatives and goals were discussed and mutually agreed upon: by patient  Doreene Burke PT, DPT  08/01/2019, 12:57 PM

## 2019-08-01 NOTE — Discharge Summary (Signed)
Physician Discharge Summary  Patient ID: [name] MRN: [id number] DOB/AGE: 12-14-1965 54 y.o.  Admit date: 07/24/2019 Discharge date: 08/01/2019  Admission Diagnoses:Lumbar epidural abscess cauda equina syndrome severe spinal stenosis L3-4 L4-5   Discharge Diagnoses: same   Discharged Condition: good  Hospital Course: The patient was admitted on 07/24/2019 and taken to the operating room where the patient underwent decompressive lumbar lami L3-4, 4-5 for evac of epidural abscess. The patient tolerated the procedure well and was taken to the recovery room and then to the floor in stable condition. The hospital course was routine. There were no complications. The wound remained clean dry and intact. Pt had appropriate back soreness. No complaints of leg pain or new N/T/W. The patient remained afebrile with stable vital signs, and tolerated a regular diet. The patient continued to increase activities, and pain was well controlled with oral pain medications.   Consults: None  Significant Diagnostic Studies:  Results for orders placed or performed during the hospital encounter of 07/24/19  Culture, blood (routine x 2)   Specimen: BLOOD RIGHT HAND  Result Value Ref Range   Specimen Description      BLOOD RIGHT HAND Performed at [facility] [address], [place], [place] [address]    Special Requests      BOTTLES DRAWN AEROBIC ONLY Blood Culture results may not be optimal due to an inadequate volume of blood received in culture bottles Performed at [facility] [address] [place], [place] [address]    Culture  Setup Time      AEROBIC BOTTLE ONLY GRAM POSITIVE COCCI CRITICAL VALUE NOTED.  VALUE IS CONSISTENT WITH PREVIOUSLY REPORTED AND CALLED VALUE.    Culture (A)     STAPHYLOCOCCUS AUREUS SUSCEPTIBILITIES PERFORMED ON PREVIOUS CULTURE WITHIN THE LAST 5 DAYS. Performed at [facility], [place] [address], [place], [place]  [address]    Report Status 07/27/2019 FINAL   Culture, blood (routine x 2)   Specimen: BLOOD  Result Value Ref Range   Specimen Description      BLOOD LEFT ANTECUBITAL Performed at [facility], [place] [address], [place], [place] [address]    Special Requests      BOTTLES DRAWN AEROBIC AND ANAEROBIC Blood Culture adequate volume Performed at [facility] [address], [place], [place] [address]    Culture  Setup Time      GRAM POSITIVE COCCI IN BOTH AEROBIC AND ANAEROBIC BOTTLES CRITICAL RESULT CALLED TO, READ BACK BY AND VERIFIED WITH: Andres Shad PharmD 9:00 07/25/19 ([email or web address]) Performed at [facility], [place] [address] [place],  [address]    Culture STAPHYLOCOCCUS AUREUS (A)    Report Status 07/27/2019 FINAL    Organism ID, Bacteria STAPHYLOCOCCUS AUREUS       Susceptibility   Staphylococcus aureus - MIC*    CIPROFLOXACIN >=8 RESISTANT Resistant     ERYTHROMYCIN >=8 RESISTANT Resistant     GENTAMICIN <=0.5 SENSITIVE Sensitive     OXACILLIN 0.5 SENSITIVE Sensitive     TETRACYCLINE <=1 SENSITIVE Sensitive     VANCOMYCIN 1 SENSITIVE Sensitive     TRIMETH/SULFA <=10 SENSITIVE Sensitive     CLINDAMYCIN <=0.25 SENSITIVE Sensitive     RIFAMPIN <=0.5 SENSITIVE Sensitive     Inducible Clindamycin NEGATIVE Sensitive     * STAPHYLOCOCCUS AUREUS  Urine culture   Specimen: Urine, Random  Result Value Ref Range   Specimen Description      URINE,  RANDOM Performed at [facility], [place] [address] [place], [place] [address]    Special Requests      NONE Performed at [facility], [place] [address], [place], [place] [address]    Culture >=100,000 COLONIES/mL STAPHYLOCOCCUS AUREUS (A)    Report Status 07/27/2019 FINAL    Organism ID, Bacteria STAPHYLOCOCCUS AUREUS (A)       Susceptibility   Staphylococcus aureus - MIC*    CIPROFLOXACIN >=8 RESISTANT Resistant     GENTAMICIN <=0.5 SENSITIVE Sensitive      NITROFURANTOIN <=16 SENSITIVE Sensitive     OXACILLIN 0.5 SENSITIVE Sensitive     TETRACYCLINE <=1 SENSITIVE Sensitive     VANCOMYCIN <=0.5 SENSITIVE Sensitive     TRIMETH/SULFA <=10 SENSITIVE Sensitive     CLINDAMYCIN <=0.25 SENSITIVE Sensitive     RIFAMPIN <=0.5 SENSITIVE Sensitive     Inducible Clindamycin NEGATIVE Sensitive     * >=100,000 COLONIES/mL STAPHYLOCOCCUS AUREUS  Respiratory Panel by RT PCR (Flu A&B, Covid) - Nasopharyngeal Swab   Specimen: Nasopharyngeal Swab  Result Value Ref Range   SARS Coronavirus 2 by RT PCR NEGATIVE NEGATIVE   Influenza A by PCR NEGATIVE NEGATIVE   Influenza B by PCR NEGATIVE NEGATIVE  Aerobic/Anaerobic Culture (surgical/deep wound)   Specimen: Abscess  Result Value Ref Range   Specimen Description ABSCESS    Special Requests EPIDURAL SAMPLE A    Gram Stain      ABUNDANT WBC PRESENT, PREDOMINANTLY PMN ABUNDANT GRAM POSITIVE COCCI    Culture      MODERATE STAPHYLOCOCCUS AUREUS NO ANAEROBES ISOLATED Performed at [facility], [address], [place], [place] [address]    Report Status 07/30/2019 FINAL    Organism ID, Bacteria STAPHYLOCOCCUS AUREUS       Susceptibility   Staphylococcus aureus - MIC*    CIPROFLOXACIN >=8 RESISTANT Resistant     ERYTHROMYCIN >=8 RESISTANT Resistant     GENTAMICIN <=0.5 SENSITIVE Sensitive     OXACILLIN 0.5 SENSITIVE Sensitive     TETRACYCLINE <=1 SENSITIVE Sensitive     VANCOMYCIN <=0.5 SENSITIVE Sensitive     TRIMETH/SULFA <=10 SENSITIVE Sensitive     CLINDAMYCIN <=0.25 SENSITIVE Sensitive     RIFAMPIN <=0.5 SENSITIVE Sensitive     Inducible Clindamycin NEGATIVE Sensitive     * MODERATE STAPHYLOCOCCUS AUREUS  Aerobic/Anaerobic Culture (surgical/deep wound)   Specimen: Abscess  Result Value Ref Range   Specimen Description ABSCESS    Special Requests EPIDURAL SAMPLE B    Gram Stain      RARE WBC PRESENT, PREDOMINANTLY PMN NO ORGANISMS SEEN    Culture      FEW STAPHYLOCOCCUS AUREUS NO  ANAEROBES ISOLATED Performed at [facility] [address] [place], [place] [address]    Report Status 07/30/2019 FINAL    Organism ID, Bacteria STAPHYLOCOCCUS AUREUS       Susceptibility   Staphylococcus aureus - MIC*    CIPROFLOXACIN >=8 RESISTANT Resistant     ERYTHROMYCIN >=8 RESISTANT Resistant     GENTAMICIN <=0.5 SENSITIVE Sensitive     OXACILLIN 0.5 SENSITIVE Sensitive     TETRACYCLINE <=1 SENSITIVE Sensitive     VANCOMYCIN 1 SENSITIVE Sensitive     TRIMETH/SULFA <=10 SENSITIVE Sensitive     CLINDAMYCIN <=0.25 SENSITIVE Sensitive     RIFAMPIN <=0.5 SENSITIVE Sensitive     Inducible Clindamycin NEGATIVE Sensitive     * FEW STAPHYLOCOCCUS AUREUS  Aerobic/Anaerobic Culture (surgical/deep wound)   Specimen: Abscess  Result Value Ref Range   Specimen Description ABSCESS    Special Requests EPIDURAL SAMPLE C    Gram Stain      ABUNDANT WBC PRESENT, PREDOMINANTLY PMN ABUNDANT GRAM POSITIVE COCCI    Culture      MODERATE STAPHYLOCOCCUS AUREUS NO ANAEROBES ISOLATED Performed at [facility], [address] [place], [place] [address]    Report Status 07/30/2019 FINAL    Organism ID, Bacteria STAPHYLOCOCCUS AUREUS       Susceptibility   Staphylococcus aureus - MIC*    CIPROFLOXACIN >=8 RESISTANT Resistant     ERYTHROMYCIN >=8 RESISTANT Resistant     GENTAMICIN <=0.5 SENSITIVE Sensitive     OXACILLIN 0.5 SENSITIVE Sensitive     TETRACYCLINE <=1 SENSITIVE Sensitive     VANCOMYCIN <=0.5 SENSITIVE Sensitive     TRIMETH/SULFA <=10 SENSITIVE Sensitive     CLINDAMYCIN <=0.25 SENSITIVE Sensitive     RIFAMPIN <=0.5 SENSITIVE Sensitive     Inducible Clindamycin NEGATIVE Sensitive     * MODERATE STAPHYLOCOCCUS AUREUS  Blood Culture ID Panel (Reflexed)  Result Value Ref Range   Enterococcus species NOT DETECTED NOT DETECTED   Listeria monocytogenes NOT DETECTED NOT DETECTED   Staphylococcus species DETECTED (A) NOT DETECTED   Staphylococcus aureus (BCID) DETECTED  (A) NOT DETECTED   Methicillin resistance NOT DETECTED NOT DETECTED   Streptococcus species NOT DETECTED NOT DETECTED   Streptococcus agalactiae NOT DETECTED NOT DETECTED   Streptococcus pneumoniae NOT DETECTED NOT DETECTED   Streptococcus pyogenes NOT DETECTED NOT DETECTED   Acinetobacter baumannii NOT DETECTED NOT DETECTED   Enterobacteriaceae species NOT DETECTED NOT DETECTED   Enterobacter cloacae complex NOT DETECTED NOT DETECTED   Escherichia coli NOT DETECTED NOT DETECTED   Klebsiella oxytoca NOT DETECTED NOT DETECTED   Klebsiella pneumoniae NOT DETECTED NOT DETECTED   Proteus species NOT DETECTED NOT DETECTED   Serratia marcescens NOT DETECTED NOT DETECTED   Haemophilus influenzae NOT DETECTED NOT DETECTED   Neisseria meningitidis NOT DETECTED NOT DETECTED   Pseudomonas aeruginosa NOT DETECTED NOT DETECTED   Candida albicans NOT DETECTED NOT DETECTED   Candida glabrata NOT DETECTED NOT DETECTED   Candida krusei NOT DETECTED NOT DETECTED   Candida parapsilosis NOT DETECTED NOT DETECTED   Candida tropicalis NOT DETECTED NOT DETECTED  Aerobic/Anaerobic Culture (surgical/deep wound)   Specimen: Abscess  Result Value Ref Range   Specimen Description ABSCESS    Special Requests Normal    Gram Stain      FEW WBC PRESENT, PREDOMINANTLY PMN MODERATE GRAM POSITIVE COCCI    Culture      MODERATE STAPHYLOCOCCUS AUREUS NO ANAEROBES ISOLATED Performed at [facility], [address], [place], [place] [address]    Report Status 07/30/2019 FINAL    Organism ID, Bacteria STAPHYLOCOCCUS AUREUS       Susceptibility   Staphylococcus aureus - MIC*    CIPROFLOXACIN >=8 RESISTANT Resistant     ERYTHROMYCIN >=8 RESISTANT Resistant     GENTAMICIN <=0.5 SENSITIVE Sensitive     OXACILLIN 0.5 SENSITIVE Sensitive     TETRACYCLINE <=1 SENSITIVE Sensitive     VANCOMYCIN <=0.5 SENSITIVE Sensitive     TRIMETH/SULFA <=10 SENSITIVE Sensitive     CLINDAMYCIN <=0.25 SENSITIVE Sensitive      RIFAMPIN <=0.5 SENSITIVE Sensitive     Inducible Clindamycin NEGATIVE Sensitive     * MODERATE STAPHYLOCOCCUS AUREUS  Culture, blood (routine x 2)   Specimen: BLOOD RIGHT ARM  Result Value Ref Range  Specimen Description BLOOD RIGHT ARM    Special Requests      BOTTLES DRAWN AEROBIC ONLY Blood Culture results may not be optimal due to an inadequate volume of blood received in culture bottles   Culture      NO GROWTH 5 DAYS Performed at [facility] [address] [place], [place] [address]    Report Status 08/01/2019 FINAL   Culture, blood (routine x 2)   Specimen: BLOOD  Result Value Ref Range   Specimen Description BLOOD LEFT ANTECUBITAL    Special Requests      BOTTLES DRAWN AEROBIC AND ANAEROBIC Blood Culture results may not be optimal due to an inadequate volume of blood received in culture bottles   Culture      NO GROWTH 5 DAYS Performed at [facility] [address] [place], [place] [address]    Report Status 08/01/2019 FINAL   C difficile quick scan w PCR reflex   Specimen: STOOL  Result Value Ref Range   C Diff antigen NEGATIVE NEGATIVE   C Diff toxin NEGATIVE NEGATIVE   C Diff interpretation No C. difficile detected.   Urinalysis, Routine w reflex microscopic  Result Value Ref Range   Color, Urine YELLOW YELLOW   APPearance HAZY (A) CLEAR   Specific Gravity, Urine 1.017 1.005 - 1.030   pH 6.0 5.0 - 8.0   Glucose, UA NEGATIVE NEGATIVE mg/dL   Hgb urine dipstick MODERATE (A) NEGATIVE   Bilirubin Urine NEGATIVE NEGATIVE   Ketones, ur NEGATIVE NEGATIVE mg/dL   Protein, ur NEGATIVE NEGATIVE mg/dL   Nitrite POSITIVE (A) NEGATIVE   Leukocytes,Ua TRACE (A) NEGATIVE   RBC / HPF 0-5 0 - 5 RBC/hpf   WBC, UA 11-20 0 - 5 WBC/hpf   Bacteria, UA FEW (A) NONE SEEN   Mucus PRESENT    Amorphous Crystal PRESENT   Comprehensive metabolic panel  Result Value Ref Range   Sodium 128 (L) 135 - 145 mmol/L   Potassium 3.4 (L) 3.5 - 5.1 mmol/L   Chloride 90 (L)  98 - 111 mmol/L   CO2 25 22 - 32 mmol/L   Glucose, Bld 105 (H) 70 - 99 mg/dL   BUN 20 6 - 20 mg/dL   Creatinine, Ser 0.75 0.61 - 1.24 mg/dL   Calcium 9.1 8.9 - 10.3 mg/dL   Total Protein 8.6 (H) 6.5 - 8.1 g/dL   Albumin 2.7 (L) 3.5 - 5.0 g/dL   AST 34 15 - 41 U/L   ALT 49 (H) 0 - 44 U/L   Alkaline Phosphatase 122 38 - 126 U/L   Total Bilirubin 1.2 0.3 - 1.2 mg/dL   GFR calc non Af Amer >60 >60 mL/min   GFR calc Af Amer >60 >60 mL/min   Anion gap 13 5 - 15  CBC with Differential  Result Value Ref Range   WBC 19.9 (H) 4.0 - 10.5 K/uL   RBC 4.13 (L) 4.22 - 5.81 MIL/uL   Hemoglobin 13.6 13.0 - 17.0 g/dL   HCT 40.4 39.0 - 52.0 %   MCV 97.8 80.0 - 100.0 fL   MCH 32.9 26.0 - 34.0 pg   MCHC 33.7 30.0 - 36.0 g/dL   RDW 13.3 11.5 - 15.5 %   Platelets 407 (H) 150 - 400 K/uL   nRBC 0.0 0.0 - 0.2 %   Neutrophils Relative % 91 %   Neutro Abs 17.9 (H) 1.7 - 7.7 K/uL   Lymphocytes Relative 3 %  Lymphs Abs 0.7 0.7 - 4.0 K/uL   Monocytes Relative 5 %   Monocytes Absolute 1.0 0.1 - 1.0 K/uL   Eosinophils Relative 0 %   Eosinophils Absolute 0.1 0.0 - 0.5 K/uL   Basophils Relative 0 %   Basophils Absolute 0.1 0.0 - 0.1 K/uL   Immature Granulocytes 1 %   Abs Immature Granulocytes 0.20 (H) 0.00 - 0.07 K/uL  Sedimentation rate  Result Value Ref Range   Sed Rate 114 (H) 0 - 16 mm/hr  RPR  Result Value Ref Range   RPR Ser Ql NON REACTIVE NON REACTIVE  HIV Antibody (routine testing w rflx)  Result Value Ref Range   HIV Screen 4th Generation wRfx NON REACTIVE NON REACTIVE  Urine rapid drug screen (hosp performed)  Result Value Ref Range   Opiates NONE DETECTED NONE DETECTED   Cocaine NONE DETECTED NONE DETECTED   Benzodiazepines NONE DETECTED NONE DETECTED   Amphetamines NONE DETECTED NONE DETECTED   Tetrahydrocannabinol NONE DETECTED NONE DETECTED   Barbiturates NONE DETECTED NONE DETECTED  Lactic acid, plasma  Result Value Ref Range   Lactic Acid, Venous 1.8 0.5 - 1.9 mmol/L  Lactic  acid, plasma  Result Value Ref Range   Lactic Acid, Venous 1.0 0.5 - 1.9 mmol/L  APTT  Result Value Ref Range   aPTT 26 24 - 36 seconds  Protime-INR  Result Value Ref Range   Prothrombin Time 17.6 (H) 11.4 - 15.2 seconds   INR 1.5 (H) 0.8 - 1.2  Ethanol  Result Value Ref Range   Alcohol, Ethyl (B) <10 <10 mg/dL  CBC  Result Value Ref Range   WBC 15.5 (H) 4.0 - 10.5 K/uL   RBC 3.47 (L) 4.22 - 5.81 MIL/uL   Hemoglobin 11.4 (L) 13.0 - 17.0 g/dL   HCT 34.3 (L) 39.0 - 52.0 %   MCV 98.8 80.0 - 100.0 fL   MCH 32.9 26.0 - 34.0 pg   MCHC 33.2 30.0 - 36.0 g/dL   RDW 13.4 11.5 - 15.5 %   Platelets 366 150 - 400 K/uL   nRBC 0.0 0.0 - 0.2 %  Comprehensive metabolic panel  Result Value Ref Range   Sodium 136 135 - 145 mmol/L   Potassium 4.2 3.5 - 5.1 mmol/L   Chloride 100 98 - 111 mmol/L   CO2 27 22 - 32 mmol/L   Glucose, Bld 106 (H) 70 - 99 mg/dL   BUN 19 6 - 20 mg/dL   Creatinine, Ser 0.87 0.61 - 1.24 mg/dL   Calcium 8.6 (L) 8.9 - 10.3 mg/dL   Total Protein 7.0 6.5 - 8.1 g/dL   Albumin 1.8 (L) 3.5 - 5.0 g/dL   AST 25 15 - 41 U/L   ALT 27 0 - 44 U/L   Alkaline Phosphatase 92 38 - 126 U/L   Total Bilirubin 1.0 0.3 - 1.2 mg/dL   GFR calc non Af Amer >60 >60 mL/min   GFR calc Af Amer >60 >60 mL/min   Anion gap 9 5 - 15  C-reactive protein  Result Value Ref Range   CRP 20.6 (H) <1.0 mg/dL  CBC  Result Value Ref Range   WBC 9.7 4.0 - 10.5 K/uL   RBC 3.44 (L) 4.22 - 5.81 MIL/uL   Hemoglobin 11.1 (L) 13.0 - 17.0 g/dL   HCT 34.2 (L) 39.0 - 52.0 %   MCV 99.4 80.0 - 100.0 fL   MCH 32.3 26.0 - 34.0 pg   MCHC 32.5 30.0 -  36.0 g/dL   RDW 13.4 11.5 - 15.5 %   Platelets 371 150 - 400 K/uL   nRBC 0.0 0.0 - 0.2 %  Basic metabolic panel  Result Value Ref Range   Sodium 137 135 - 145 mmol/L   Potassium 3.0 (L) 3.5 - 5.1 mmol/L   Chloride 103 98 - 111 mmol/L   CO2 25 22 - 32 mmol/L   Glucose, Bld 93 70 - 99 mg/dL   BUN 10 6 - 20 mg/dL   Creatinine, Ser 0.76 0.61 - 1.24 mg/dL    Calcium 8.1 (L) 8.9 - 10.3 mg/dL   GFR calc non Af Amer >60 >60 mL/min   GFR calc Af Amer >60 >60 mL/min   Anion gap 9 5 - 15  ECHOCARDIOGRAM COMPLETE  Result Value Ref Range   Weight 3,760 oz   Height 73 in   BP 142/82 mmHg  GC/Chlamydia probe amp ([place]) not at [facility]  Result Value Ref Range   Chlamydia Negative    Neisseria Gonorrhea Negative     DG Lumbar Spine 2-3 Views  Result Date: 07/25/2019 CLINICAL DATA:  Decompression laminectomy L3-L4 for epidural abscess. EXAM: LUMBAR SPINE - 2-3 VIEW COMPARISON:  Lumbar spine MRI yesterday. FINDINGS: Two lateral portable cross-table views of the lumbar spine obtained in the operating room. Image number 1 demonstrates surgical instrument localizing posterior to the L3 spinous process. Image number 2 demonstrates surgical instrument localizing posterior to L4-L5. IMPRESSION: Lateral spot views obtained in the operating room localizing posterior to L3 spinous process and L4-L5. Electronically Signed   By: Keith Rake M.D.   On: 07/25/2019 03:11   MR BRAIN W WO CONTRAST  Result Date: 07/26/2019 CLINICAL DATA:  Bacteremia.  Endocarditis.  L3-4 spinal infection. EXAM: MRI HEAD WITHOUT AND WITH CONTRAST TECHNIQUE: Multiplanar, multiecho pulse sequences of the brain and surrounding structures were obtained without and with intravenous contrast. CONTRAST:  39mL GADAVIST GADOBUTROL 1 MMOL/ML IV SOLN COMPARISON:  None. FINDINGS: Brain: Diffusion imaging shows a 16 mm acute infarction in the peripheral right cerebellum. No hemorrhage or contrast enhancement in this location. Cerebral hemispheres show a punctate acute infarction at the right frontoparietal vertex. No other acute finding. The brainstem and cerebellum are otherwise normal. Cerebral hemispheres elsewhere are normal without evidence of old infarctions. No mass, hemorrhage, hydrocephalus or extra-axial collection. No abnormal brain or leptomeningeal enhancement. Vascular: Major vessels at  the base of the brain show flow. Skull and upper cervical spine: Negative Sinuses/Orbits: Clear/normal Other: None IMPRESSION: 16 mm acute infarction in the peripheral right cerebellum. Punctate acute infarction at the right frontoparietal vertex. Differing vascular territories raise the possibility of embolic disease from the heart or ascending aorta. There is no evidence of hemorrhage or contrast enhancement that would allow me to make the specific diagnosis of septic emboli, but that is obviously the concern given the clinical history. Electronically Signed   By: Nelson Chimes M.D.   On: 07/26/2019 15:38   MR THORACIC SPINE W WO CONTRAST  Result Date: 07/24/2019 CLINICAL DATA:  Initial evaluation for acute low back pain with left leg pain. EXAM: MRI THORACIC AND LUMBAR SPINE WITHOUT AND WITH CONTRAST TECHNIQUE: Multiplanar and multiecho pulse sequences of the thoracic and lumbar spine were obtained without and with intravenous contrast. CONTRAST:  3mL GADAVIST GADOBUTROL 1 MMOL/ML IV SOLN COMPARISON:  Prior radiograph from 07/12/2019. FINDINGS: MRI THORACIC SPINE FINDINGS Alignment: Vertebral bodies normally aligned with preservation of the normal thoracic kyphosis. No listhesis. Vertebrae: Vertebral body  height maintained without evidence for acute or chronic fracture. Multiple scattered chronic endplate Schmorl's nodes noted throughout the mid and lower thoracic spine. Underlying bone marrow signal intensity diffusely decreased on T1 weighted imaging, nonspecific, but most commonly related to anemia, smoking, or obesity. Few scattered benign hemangiomata noted within the T1, T6, and T12 vertebral bodies. No other discrete or worrisome osseous lesions. No other abnormal marrow edema or enhancement. No evidence for osteomyelitis discitis or septic arthritis. Cord: Signal intensity within the thoracic spinal cord is grossly within normal limits on this motion degraded exam. Normal cord caliber morphology. No  abnormal enhancement. No appreciable epidural collection. Paraspinal and other soft tissues: Paraspinous soft tissues within normal limits. Minimal atelectatic changes noted within the posterior left lung. Disc levels: T1-2:  Unremarkable. T2-3: Mild right eccentric disc bulge. No canal or foraminal stenosis. T3-4: Negative interspace. Right greater than left facet hypertrophy. No stenosis. T4-5:  Negative interspace.  Mild facet hypertrophy.  No stenosis. T5-6: Diffuse disc bulge with intervertebral disc space narrowing. Mild facet hypertrophy. No significant spinal stenosis. Foramina remain patent. T6-7:  Unremarkable. T7-8: Shallow right paracentral disc protrusion indents the right ventral thecal sac. Minimal flattening of the ventral right cord without cord signal changes. No significant canal or foraminal stenosis. T8-9: Shallow central disc protrusion indents the ventral thecal sac, slightly eccentric to the right. Minimal flattening of the ventral cord without cord signal changes. No significant spinal stenosis. Foramina remain patent. T9-10: Mild disc bulge.  No stenosis. T10-11: Mild disc bulge. Prominent right-sided endplate osteophytic spurring. Right greater than left facet hypertrophy. No stenosis. T11-12: Mild disc bulge. Moderate bilateral facet hypertrophy. No stenosis. T12-L1:  Unremarkable. MRI LUMBAR SPINE FINDINGS Segmentation: Standard. Lowest well-formed disc space labeled the L5-S1 level. Alignment: Trace retrolisthesis of L3 on L4 and L4 on L5. Alignment otherwise normal preservation of the normal lumbar lordosis. Vertebrae: Vertebral body height maintained without evidence for acute or chronic fracture. Bone marrow signal intensity diffusely decreased on T1 weighted imaging, nonspecific, but most commonly related to anemia, smoking, or obesity. Few scattered benign hemangiomata noted, most prominent of which position within the L2 and L3 vertebral bodies. No other worrisome osseous lesions.  There is abnormal marrow edema and enhancement involving the right greater than left aspect of the L4 vertebral body, concerning for acute osteomyelitis. Extension into the right L4 pedicle with possible involvement of the right L3-4 facet (series 18, image 5). Mild edema and enhancement also noted at the right aspect of the L3 inferior endplate, which could also be involved. There is preserved disc height at the intervening L3-4 interspace without overt evidence for acute discitis. Associated paraspinous edema and enhancement seen involving the adjacent psoas musculature bilaterally, extending from L2-3 inferiorly, worse on the right. Superimposed paraspinous abscesses are seen bilaterally. Collection on the right measures 3.9 x 3.2 cm (series 19, image 26). Few scattered heterogeneous but smaller collection seen involving the left psoas, largest of which seen at the level of L4-5 and measures 2.1 cm (series 19, image 25). Evidence for epidural extension of infection with epidural abscess seen extending from approximately L3-4 through L4-5. Abscess primarily involves the dorsal epidural space at the level of L3-4 (series 19, image 18). Overall, collection measures approximately 1.0 x 0.7 x 6.3 cm in greatest dimensions (AP by transverse by craniocaudad). Secondary mass effect on the thecal sac which is compressed anteriorly at the level of L3-4 (series 19, image 18), into the left at the level of L4-5 (series 19,  image 27). There is fairly diffuse severe spinal stenosis extending from L3 through L4-5. Conus medullaris: Extends to the L1 level and appears normal. Paraspinal and other soft tissues: Paraspinal infection with associated bilateral psoas muscle abscesses as above. Additional mild soft tissue edema adjacent to the right L3-4 facet, likely infected. Disc levels: L1-2: Mild circumferential disc bulge. Mild bilateral facet hypertrophy. No significant stenosis. L2-3: Minimal annular disc bulge. Mild to  moderate bilateral facet hypertrophy. No significant canal or foraminal stenosis. L3-4: Findings concerning for osteomyelitis involving the L4 and to a lesser extent L3 vertebral bodies. Suspected discitis involving the L3-4 interspace, although no overt changes evident by MRI as of yet. Circumferential disc bulge. Moderate bilateral facet hypertrophy with small bilateral joint effusions. Edema enhancement about the right L3-4 facet, suspected to be infected. Dorsal epidural abscess with compression of the thecal sac anteriorly. Resultant severe spinal stenosis. Moderate right L3 foraminal narrowing. L4-5: Diffuse disc bulge with disc desiccation. Changes related osteomyelitis involving the L4 vertebral body. Moderate bilateral facet hypertrophy. Epidural abscess involving the dorsal and right aspect of the spinal canal with compression of the thecal sac to the left. Resultant severe spinal stenosis. Moderate right with mild left L4 foraminal narrowing. L5-S1: Negative interspace. Moderate to severe right worse than left facet hypertrophy. No stenosis. IMPRESSION: 1. Findings consistent with acute osteomyelitis centered about the L3-4 interspace as above. Associated epidural abscess extending from L3-4 through L4-5 with resultant severe diffuse spinal stenosis. 2. Associated paraspinous soft tissue infection with bilateral psoas muscle abscesses, right greater than left. 3. No other evidence for acute or distant infection elsewhere within the thoracic or lumbar spine. 4. Underlying mild to moderate multilevel degenerative spondylosis as above. No other high-grade stenosis or impingement. Critical Value/emergent results were called by telephone at the time of interpretation on 07/24/2019 at 9:22 pm to providerCaroline Rau , who verbally acknowledged these results. Electronically Signed   By: Jeannine Boga M.D.   On: 07/24/2019 21:23   MR Lumbar Spine W Wo Contrast  Result Date: 07/24/2019 CLINICAL DATA:   Initial evaluation for acute low back pain with left leg pain. EXAM: MRI THORACIC AND LUMBAR SPINE WITHOUT AND WITH CONTRAST TECHNIQUE: Multiplanar and multiecho pulse sequences of the thoracic and lumbar spine were obtained without and with intravenous contrast. CONTRAST:  39mL GADAVIST GADOBUTROL 1 MMOL/ML IV SOLN COMPARISON:  Prior radiograph from 07/12/2019. FINDINGS: MRI THORACIC SPINE FINDINGS Alignment: Vertebral bodies normally aligned with preservation of the normal thoracic kyphosis. No listhesis. Vertebrae: Vertebral body height maintained without evidence for acute or chronic fracture. Multiple scattered chronic endplate Schmorl's nodes noted throughout the mid and lower thoracic spine. Underlying bone marrow signal intensity diffusely decreased on T1 weighted imaging, nonspecific, but most commonly related to anemia, smoking, or obesity. Few scattered benign hemangiomata noted within the T1, T6, and T12 vertebral bodies. No other discrete or worrisome osseous lesions. No other abnormal marrow edema or enhancement. No evidence for osteomyelitis discitis or septic arthritis. Cord: Signal intensity within the thoracic spinal cord is grossly within normal limits on this motion degraded exam. Normal cord caliber morphology. No abnormal enhancement. No appreciable epidural collection. Paraspinal and other soft tissues: Paraspinous soft tissues within normal limits. Minimal atelectatic changes noted within the posterior left lung. Disc levels: T1-2:  Unremarkable. T2-3: Mild right eccentric disc bulge. No canal or foraminal stenosis. T3-4: Negative interspace. Right greater than left facet hypertrophy. No stenosis. T4-5:  Negative interspace.  Mild facet hypertrophy.  No stenosis. T5-6: Diffuse  disc bulge with intervertebral disc space narrowing. Mild facet hypertrophy. No significant spinal stenosis. Foramina remain patent. T6-7:  Unremarkable. T7-8: Shallow right paracentral disc protrusion indents the right  ventral thecal sac. Minimal flattening of the ventral right cord without cord signal changes. No significant canal or foraminal stenosis. T8-9: Shallow central disc protrusion indents the ventral thecal sac, slightly eccentric to the right. Minimal flattening of the ventral cord without cord signal changes. No significant spinal stenosis. Foramina remain patent. T9-10: Mild disc bulge.  No stenosis. T10-11: Mild disc bulge. Prominent right-sided endplate osteophytic spurring. Right greater than left facet hypertrophy. No stenosis. T11-12: Mild disc bulge. Moderate bilateral facet hypertrophy. No stenosis. T12-L1:  Unremarkable. MRI LUMBAR SPINE FINDINGS Segmentation: Standard. Lowest well-formed disc space labeled the L5-S1 level. Alignment: Trace retrolisthesis of L3 on L4 and L4 on L5. Alignment otherwise normal preservation of the normal lumbar lordosis. Vertebrae: Vertebral body height maintained without evidence for acute or chronic fracture. Bone marrow signal intensity diffusely decreased on T1 weighted imaging, nonspecific, but most commonly related to anemia, smoking, or obesity. Few scattered benign hemangiomata noted, most prominent of which position within the L2 and L3 vertebral bodies. No other worrisome osseous lesions. There is abnormal marrow edema and enhancement involving the right greater than left aspect of the L4 vertebral body, concerning for acute osteomyelitis. Extension into the right L4 pedicle with possible involvement of the right L3-4 facet (series 18, image 5). Mild edema and enhancement also noted at the right aspect of the L3 inferior endplate, which could also be involved. There is preserved disc height at the intervening L3-4 interspace without overt evidence for acute discitis. Associated paraspinous edema and enhancement seen involving the adjacent psoas musculature bilaterally, extending from L2-3 inferiorly, worse on the right. Superimposed paraspinous abscesses are seen  bilaterally. Collection on the right measures 3.9 x 3.2 cm (series 19, image 26). Few scattered heterogeneous but smaller collection seen involving the left psoas, largest of which seen at the level of L4-5 and measures 2.1 cm (series 19, image 25). Evidence for epidural extension of infection with epidural abscess seen extending from approximately L3-4 through L4-5. Abscess primarily involves the dorsal epidural space at the level of L3-4 (series 19, image 18). Overall, collection measures approximately 1.0 x 0.7 x 6.3 cm in greatest dimensions (AP by transverse by craniocaudad). Secondary mass effect on the thecal sac which is compressed anteriorly at the level of L3-4 (series 19, image 18), into the left at the level of L4-5 (series 19, image 27). There is fairly diffuse severe spinal stenosis extending from L3 through L4-5. Conus medullaris: Extends to the L1 level and appears normal. Paraspinal and other soft tissues: Paraspinal infection with associated bilateral psoas muscle abscesses as above. Additional mild soft tissue edema adjacent to the right L3-4 facet, likely infected. Disc levels: L1-2: Mild circumferential disc bulge. Mild bilateral facet hypertrophy. No significant stenosis. L2-3: Minimal annular disc bulge. Mild to moderate bilateral facet hypertrophy. No significant canal or foraminal stenosis. L3-4: Findings concerning for osteomyelitis involving the L4 and to a lesser extent L3 vertebral bodies. Suspected discitis involving the L3-4 interspace, although no overt changes evident by MRI as of yet. Circumferential disc bulge. Moderate bilateral facet hypertrophy with small bilateral joint effusions. Edema enhancement about the right L3-4 facet, suspected to be infected. Dorsal epidural abscess with compression of the thecal sac anteriorly. Resultant severe spinal stenosis. Moderate right L3 foraminal narrowing. L4-5: Diffuse disc bulge with disc desiccation. Changes related osteomyelitis  involving the L4 vertebral body. Moderate bilateral facet hypertrophy. Epidural abscess involving the dorsal and right aspect of the spinal canal with compression of the thecal sac to the left. Resultant severe spinal stenosis. Moderate right with mild left L4 foraminal narrowing. L5-S1: Negative interspace. Moderate to severe right worse than left facet hypertrophy. No stenosis. IMPRESSION: 1. Findings consistent with acute osteomyelitis centered about the L3-4 interspace as above. Associated epidural abscess extending from L3-4 through L4-5 with resultant severe diffuse spinal stenosis. 2. Associated paraspinous soft tissue infection with bilateral psoas muscle abscesses, right greater than left. 3. No other evidence for acute or distant infection elsewhere within the thoracic or lumbar spine. 4. Underlying mild to moderate multilevel degenerative spondylosis as above. No other high-grade stenosis or impingement. Critical Value/emergent results were called by telephone at the time of interpretation on 07/24/2019 at 9:22 pm to providerCaroline Rau , who verbally acknowledged these results. Electronically Signed   By: Jeannine Boga M.D.   On: 07/24/2019 21:23   CT Abdomen Pelvis W Contrast  Result Date: 07/19/2019 CLINICAL DATA:  Severe low back pain. Elevated C reactive protein. Leukocytosis. Elevated sedimentation rate. EXAM: CT ABDOMEN AND PELVIS WITH CONTRAST TECHNIQUE: Multidetector CT imaging of the abdomen and pelvis was performed using the standard protocol following bolus administration of intravenous contrast. CONTRAST:  169mL OMNIPAQUE IOHEXOL 300 MG/ML  SOLN COMPARISON:  CT scan of the pelvis dated 06/05/2007 FINDINGS: Lower chest: Normal. Hepatobiliary: No focal liver abnormality is seen. No gallstones, gallbladder wall thickening, or biliary dilatation. Pancreas: Unremarkable. No pancreatic ductal dilatation or surrounding inflammatory changes. Spleen: Normal in size without focal  abnormality. Adrenals/Urinary Tract: The adrenal glands are normal. Kidneys are normal except for an exophytic 15 mm cyst on the lower pole of the left kidney. No hydronephrosis. Bladder is normal. Stomach/Bowel: Stomach is within normal limits. Appendix appears normal. No evidence of bowel wall thickening, distention, or inflammatory changes. Vascular/Lymphatic: Slight aortic atherosclerosis. No enlarged abdominal or pelvic lymph nodes. Reproductive: Prostate is unremarkable. Other: No abdominal wall hernia or abnormality. No abdominopelvic ascites. Musculoskeletal: No acute or significant osseous findings. Bilateral hip prostheses. IMPRESSION: Benign-appearing abdomen and pelvis. Electronically Signed   By: Lorriane Shire M.D.   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 The antibiotics were administered within an appropriate time frame prior to the initiation of the procedure. ANESTHESIA/SEDATION: Fentanyl 100 mcg IV; Versed 2.0 mg IV Moderate Sedation Time:  10 MINUTES The patient was continuously monitored during the procedure by the interventional radiology nurse under my direct supervision. COMPLICATIONS: None immediate. PROCEDURE: Informed written consent was obtained from the patient after a thorough discussion of the procedural risks, benefits and alternatives. All questions were addressed. Maximal Sterile Barrier Technique was utilized including caps, mask, sterile gowns, sterile gloves, sterile drape, hand hygiene and skin antiseptic. A timeout was performed prior to the initiation of the procedure. Previous imaging reviewed. Patient position prone. Noncontrast localization CT performed. The right psoas muscle fluid collection was localized and marked for a  posterior approach. Under sterile conditions and local anesthesia, a 17 gauge 11.8 cm access was advanced from a posterior paraspinous approach into the fluid collection. Needle position confirmed with CT. Syringe aspiration yielded 30 cc purulent fluid. Sample sent for culture. Needle removed. Postprocedure imaging demonstrates no hemorrhage or hematoma. Patient  tolerated the procedure well. IMPRESSION: Successful CT-guided needle aspiration of the right psoas muscle paraspinous abscess. Electronically Signed   By: Jerilynn Mages.  Shick M.D.   On: 07/25/2019 15:09   DG Chest Port 1 View  Result Date: 07/24/2019 CLINICAL DATA:  Sepsis. EXAM: PORTABLE CHEST 1 VIEW COMPARISON:  Chest x-ray dated September 29, 2016. FINDINGS: The heart size and mediastinal contours are within normal limits. Both lungs are clear. The visualized skeletal structures are unremarkable. IMPRESSION: No active disease. Electronically Signed   By: Titus Dubin M.D.   On: 07/24/2019 18:10   ECHOCARDIOGRAM COMPLETE  Result Date: 07/27/2019   ECHOCARDIOGRAM REPORT   Patient Name:   CALIL VANELLA Moe Date of Exam: 07/27/2019 Medical Rec #:  [id number]     Height:       73.0 in Accession #:    [id number]    Weight:       235.0 lb Date of Birth:  1965/10/20     BSA:          2.30 m Patient Age:    67 years      BP:           142/82 mmHg Patient Gender: M             HR:           88 bpm. Exam Location:  Inpatient Procedure: 2D Echo Indications:   Bacteremia 790.7  History:       Patient has no prior history of Echocardiogram examinations.                Abscess.  Sonographer:   Johny Chess Referring      Platea Phys: IMPRESSIONS  1. Left ventricular ejection fraction, by visual estimation, is 55 to 60%. The left ventricle has normal function. There is borderline left ventricular hypertrophy.  2. Mildly dilated left ventricular internal cavity size.  3. The left ventricle has no regional wall motion abnormalities.  4. Global right ventricle  has normal systolic function.The right ventricular size is normal. No increase in right ventricular wall thickness.  5. Left atrial size was normal.  6. Right atrial size was normal.  7. Presence of pericardial fat pad.  8. The mitral valve is grossly normal. Trivial mitral valve regurgitation.  9. The tricuspid valve is grossly normal. 10. The aortic valve is tricuspid. Aortic valve regurgitation is not visualized. 11. The pulmonic valve was grossly normal. Pulmonic valve regurgitation is not visualized. 12. TR signal is inadequate for assessing pulmonary artery systolic pressure. 13. The inferior vena cava is normal in size with greater than 50% respiratory variability, suggesting right atrial pressure of 3 mmHg. 14. No obvious valvular vegetations identified. FINDINGS  Left Ventricle: Left ventricular ejection fraction, by visual estimation, is 55 to 60%. The left ventricle has normal function. The left ventricle has no regional wall motion abnormalities. The left ventricular internal cavity size was mildly dilated left ventricle. There is borderline left ventricular hypertrophy. Left ventricular diastolic parameters were normal. Right Ventricle: The right ventricular size is normal. No increase in right ventricular wall thickness. Global RV systolic function is has normal systolic function. Left Atrium: Left atrial size was normal in size. Right Atrium: Right atrial size was normal in size Pericardium: There is no evidence of pericardial effusion. Presence of pericardial fat pad. Mitral Valve: The mitral valve is grossly normal. Trivial mitral valve regurgitation. Tricuspid Valve: The tricuspid valve is grossly normal. Tricuspid valve regurgitation is trivial.  Aortic Valve: The aortic valve is tricuspid. Aortic valve regurgitation is not visualized. Pulmonic Valve: The pulmonic valve was grossly normal. Pulmonic valve regurgitation is not visualized. Pulmonic regurgitation is not visualized. Aorta: The aortic  root is normal in size and structure. Venous: The inferior vena cava is normal in size with greater than 50% respiratory variability, suggesting right atrial pressure of 3 mmHg. IAS/Shunts: No atrial level shunt detected by color flow Doppler.  LEFT VENTRICLE PLAX 2D LVIDd:         6.20 cm  Diastology LVIDs:         4.30 cm  LV e' lateral:   10.90 cm/s LV PW:         1.10 cm  LV E/e' lateral: 6.6 LV IVS:        0.80 cm  LV e' medial:    8.27 cm/s LVOT diam:     2.20 cm  LV E/e' medial:  8.8 LV SV:         111 ml LV SV Index:   46.82 LVOT Area:     3.80 cm  RIGHT VENTRICLE RV S prime:     13.20 cm/s LEFT ATRIUM             Index       RIGHT ATRIUM           Index LA diam:        3.50 cm 1.52 cm/m  RA Area:     15.50 cm LA Vol (A2C):   35.3 ml 15.32 ml/m RA Volume:   40.40 ml  17.53 ml/m LA Vol (A4C):   42.7 ml 18.53 ml/m LA Biplane Vol: 41.5 ml 18.01 ml/m  AORTIC VALVE LVOT Vmax:   94.00 cm/s LVOT Vmean:  55.800 cm/s LVOT VTI:    0.166 m  AORTA Ao Root diam: 3.40 cm MITRAL VALVE MV Area (PHT): 4.68 cm             SHUNTS MV PHT:        46.98 msec           Systemic VTI:  0.17 m MV Decel Time: 162 msec             Systemic Diam: 2.20 cm MV E velocity: 72.40 cm/s 103 cm/s MV A velocity: 70.70 cm/s 70.3 cm/s MV E/A ratio:  1.02       1.5  Rozann Lesches MD Electronically signed by Rozann Lesches MD Signature Date/Time: 07/27/2019/3:45:11 PM    Final    XR HIP UNILAT W OR W/O PELVIS 2-3 VIEWS LEFT  Result Date: 07/12/2019 AP pelvis demonstrates bilateral total hip replacements in good position.  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  No acute changes.  No evidence of polyethylene wear.  There is an area of increased bone density in the area of the pubis that I suspect may be related to the sacrum.  Will order for sacral and lumbar films  XR Lumbar Spine 2-3 Views  Result Date: 07/12/2019 AP and lateral lumbar spine demonstrate degenerative changes particularly at L4-5 and L5-S1  with facet sclerosis.  No listhesis or scoliosis.  No acute changes  [place] EKG SITE RITE  Result Date: 07/30/2019 If Site Rite image not attached, placement could not be confirmed due to current cardiac rhythm.   Antibiotics:  Anti-infectives (From admission, onward)   Start     Dose/Rate Route Frequency Ordered Stop   07/26/19 1200  nafcillin 2 g in  sodium chloride 0.9 % 100 mL IVPB  Status:  Discontinued     2 g 200 mL/hr over 30 Minutes Intravenous Every 4 hours 07/26/19 0945 07/31/19 1351   07/26/19 0945  nafcillin injection 2 g  Status:  Discontinued     2 g Intravenous Every 4 hours 07/26/19 0943 07/26/19 0944   07/25/19 1400  ceFAZolin (ANCEF) IVPB 2g/100 mL premix  Status:  Discontinued     2 g 200 mL/hr over 30 Minutes Intravenous Every 8 hours 07/25/19 0929 07/26/19 0943   07/25/19 0400  ceFAZolin (ANCEF) IVPB 2g/100 mL premix  Status:  Discontinued     2 g 200 mL/hr over 30 Minutes Intravenous Every 8 hours 07/25/19 0352 07/25/19 0929   07/25/19 0400  vancomycin (VANCOREADY) IVPB 1250 mg/250 mL  Status:  Discontinued     1,250 mg 166.7 mL/hr over 90 Minutes Intravenous Every 12 hours 07/25/19 0254 07/25/19 0254   07/25/19 0400  vancomycin (VANCOREADY) IVPB 1250 mg/250 mL  Status:  Discontinued     1,250 mg 166.7 mL/hr over 90 Minutes Intravenous Every 12 hours 07/25/19 0254 07/25/19 0929   07/25/19 0138  bacitracin 50,000 Units in sodium chloride 0.9 % 500 mL irrigation  Status:  Discontinued       As needed 07/25/19 0139 07/25/19 0242   07/24/19 1700  ceFEPIme (MAXIPIME) 2 g in sodium chloride 0.9 % 100 mL IVPB     2 g 200 mL/hr over 30 Minutes Intravenous  Once 07/24/19 1647 07/24/19 2017   07/24/19 1700  metroNIDAZOLE (FLAGYL) IVPB 500 mg     500 mg 100 mL/hr over 60 Minutes Intravenous  Once 07/24/19 1647 07/24/19 2159   07/24/19 1700  vancomycin (VANCOCIN) IVPB 1000 mg/200 mL premix     1,000 mg 200 mL/hr over 60 Minutes Intravenous  Once 07/24/19 1647 07/24/19 2159       Discharge Exam: Blood pressure 129/63, pulse 95, temperature 99.8 F (37.7 C), temperature source Oral, resp. rate 20, height 6\' 1"  (1.854 m), weight 109.9 kg, SpO2 97 %. Neurologic: Grossly normal, stable weakness in lower extremities Incision cdi  Discharge Medications:   Allergies as of 07/31/2019      Reactions   Bee Venom Anaphylaxis   Hydrocodone Nausea And Vomiting      Medication List    TAKE these medications   cyclobenzaprine 5 MG tablet Commonly known as: FLEXERIL Take 1 tablet (5 mg total) by mouth 3 (three) times daily as needed for muscle spasms.   ibuprofen 200 MG tablet Commonly known as: ADVIL Take 800 mg by mouth every 8 (eight) hours as needed for moderate pain.   predniSONE 5 MG tablet Commonly known as: DELTASONE Take 3 tablets (15 mg total) by mouth daily with breakfast.       Disposition: home   Final Dx: decompressive lumbar lami L3-4, L4-5  Discharge Instructions    Diet - low sodium heart healthy   Complete by: As directed    Increase activity slowly   Complete by: As directed          Signed: Ocie Cornfield Dillin Lofgren 08/01/2019, 2:01 PM

## 2019-08-01 NOTE — Evaluation (Signed)
Occupational Therapy Assessment and Plan  Patient Details  Name: BANNER HUCKABA MRN: [id number] Date of Birth: 10/04/1965  OT Diagnosis: muscle weakness (generalized) and paraplegia at level L 3-5 Rehab Potential: Rehab Potential (ACUTE ONLY): Good ELOS: 2-3 weeks   Today's Date: 08/01/2019 OT Individual Time: 1000-1100 OT Individual Time Calculation (min): 60 min     Problem List:  Patient Active Problem List   Diagnosis Date Noted  . Cauda equina syndrome ([place]) 07/31/2019  . Neurogenic bladder 07/31/2019  . Bacteremia due to methicillin susceptible Staphylococcus aureus (MSSA) 07/30/2019  . Lumbar discitis 07/30/2019  . Cerebral septic emboli ([place]) 07/30/2019  . S/P lumbar laminectomy 07/25/2019  . Epidural abscess, L2-L5 07/25/2019  . Polymyalgia rheumatica ([place]) 07/24/2019  . Acute bilateral low back pain without sciatica 07/18/2019  . Abdominal pain 07/18/2019  . Anemia 07/18/2019  . Obesity (BMI 35.0-39.9 without comorbidity) 07/18/2019  . Constipation 07/18/2019  . Bilateral leg pain 07/12/2019  . Bilateral leg edema 04/01/2017  . Elevated blood sugar 04/01/2017  . Avascular necrosis of bone of hip, left ([place]) 10/12/2016  . Alcohol abuse, daily use 10/12/2016  . S/P total hip arthroplasty 10/12/2016    Past Medical History:  Past Medical History:  Diagnosis Date  . History of chicken pox   . Medical history non-contributory    Past Surgical History:  Past Surgical History:  Procedure Laterality Date  . CARPAL TUNNEL RELEASE Bilateral 2009  . COLONOSCOPY W/ POLYPECTOMY    . JOINT REPLACEMENT    . LACERATION REPAIR Right ~ 2012   leg  . LUMBAR LAMINECTOMY/DECOMPRESSION MICRODISCECTOMY N/A 07/24/2019   Procedure: Decompressive Lumbar Laminectomy Lumbar three-four Lumbar four-five for Epidural Abscess;  Surgeon: Kary Kos, MD;  Location: [facility];  Service: Neurosurgery;  Laterality: N/A;  . TOTAL HIP ARTHROPLASTY Right 01/11/2013   Procedure: TOTAL HIP  ARTHROPLASTY;  Surgeon: Garald Balding, MD;  Location: [facility];  Service: Orthopedics;  Laterality: Right;  . TOTAL HIP ARTHROPLASTY Left 10/12/2016   Procedure: TOTAL HIP ARTHROPLASTY;  Surgeon: Garald Balding, MD;  Location: [facility];  Service: Orthopedics;  Laterality: Left;    Assessment & Plan Clinical Impression: Patient is a 54 y.o. year old male with history of remote tobacco abuse, right total hip arthroplasty 01/11/2013 and left total hip arthroplasty 10/12/2016.  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  He has a daughter and son in the area.  Presented 07/25/2019 with progressive weakness lower extremities and back pain as well as left foot drop with urinary retention.  MRI of lumbar spine showed sizable epidural abscess L3-4 4-5 but also extensive amount of abscess in the psoas muscle.  Admission labs showed urine drug screen negative, urine culture greater 100,000 Staph aureus, sodium 128, potassium 3.4, WBC 19,900, sedimentation rate 114.  Recent CT abdomen pelvis with contrast showed no acute findings.  Underwent decompressive lumbar laminectomies L3-4 4-5 for evacuation of epidural abscess with partial medial facetectomies and foraminotomies 07/25/2019 per Dr. Saintclair Halsted.  Slow Decadron taper.  Underwent CT aspiration of right psoas abscess per interventional radiology 07/25/2019 showing MSSA bacteremia..  Echocardiogram with ejection fraction of 60% no vegetation noted.  Patient currently maintained on nafcillin 2 g every 4 hours per infectious disease for bacteremia.  Hospital course pain management as well as routine back precautions.  Patient is maintained on a regular diet.  Patient transferred to CIR on 07/31/2019 .    Patient currently requires max with basic self-care  skills and IADL secondary to muscle weakness and muscle paralysis, unbalanced muscle activation, decreased coordination and decreased motor planning and decreased sitting balance,  decreased standing balance, decreased postural control and decreased balance strategies.  Prior to hospitalization, patient could complete adl with independent .  Patient will benefit from skilled intervention to decrease level of assist with basic self-care skills, increase independence with basic self-care skills and increase level of independence with iADL prior to discharge home with care partner.  Anticipate patient will require 24 hour supervision and minimal physical assistance and follow up home health.  OT - End of Session Activity Tolerance: Tolerates 10 - 20 min activity with multiple rests Endurance Deficit: Yes Endurance Deficit Description: pain and endurance limiting ability to complete self care tasks OT Assessment Rehab Potential (ACUTE ONLY): Good OT Patient demonstrates impairments in the following area(s): Balance;Endurance;Motor;Pain;Skin Integrity OT Basic ADL's Functional Problem(s): Eating;Grooming;Bathing;Dressing;Toileting OT Advanced ADL's Functional Problem(s): Simple Meal Preparation;Light Housekeeping OT Transfers Functional Problem(s): Toilet;Tub/Shower OT Plan OT Intensity: Minimum of 1-2 x/day, 45 to 90 minutes OT Frequency: 5 out of 7 days OT Duration/Estimated Length of Stay: 2-3 weeks OT Treatment/Interventions: Balance/vestibular training;Discharge planning;Pain management;Self Care/advanced ADL retraining;Therapeutic Activities;Functional mobility training;Patient/family education;Skin care/wound managment;Therapeutic Exercise;Community reintegration;DME/adaptive equipment instruction;UE/LE Strength taining/ROM OT Self Feeding Anticipated Outcome(s): independent OT Basic Self-Care Anticipated Outcome(s): min a OT Toileting Anticipated Outcome(s): min a OT Bathroom Transfers Anticipated Outcome(s): CS OT Recommendation Patient destination: Home Follow Up Recommendations: Home health OT Equipment Details: owns commode and shower seat   Skilled  Therapeutic Intervention Patient seated in w/c, alert and aware of needs, states that he is in pain and needs to change position.  Max A SB transfer w/c to bed, max A/dependent for SSP to supine.  Pain subsided with this change of position, nursing aware and provided medication during this session.   Evaluation completed as documented below.  Daughter present for remainder of session.   adl as documented below - participation limited by pain.  Reviewed role of OT, goals for therapy, pain management, back precautions, schedule, seating/positioning, weight shifts and safety.  Patient presents with LB weakness, impaired trunk control and significant pain limiting ability to perform self care and independence with all aspects of mobility.  He is a good candidate for IP rehab with excellent family support.  He remained in bed at close of session, bed alarm set and call bell in reach.    OT Evaluation Precautions/Restrictions  Precautions Precautions: Back;Fall Precaution Booklet Issued: Yes (comment) Precaution Comments: pt re-educated on precautions Restrictions Weight Bearing Restrictions: No General   Vital Signs  Pain Pain Assessment Pain Scale: 0-10 Pain Score: 10-Worst pain ever Pain Type: Acute pain Pain Location: Back Pain Orientation: Lower Pain Descriptors / Indicators: Aching;Throbbing;Tightness Pain Frequency: Constant Pain Onset: On-going Pain Intervention(s): Repositioned;RN made aware Home Living/Prior [place] expects to be discharged to:: Private residence Living Arrangements: Alone Available Help at Discharge: Family, Available 24 hours/day Type of Home: House Home Access: Stairs to enter [employer] of Steps: 2-3 Entrance Stairs-Rails: None Home Layout: One level Bathroom Shower/Tub: Tub/shower unit, Gaffer, Door [employer] Toilet: Programmer, systems: Yes  Lives With: Alone, Other (Comment)(plans to go to  ex-wifes home with support of daughter and daughter in law) Prior Function Level of Independence: Independent with basic ADLs, Independent with gait, Independent with homemaking with ambulation, Independent with transfers  Able to Take Stairs?: Yes Driving: Yes Vocation: Full time employment Vocation Requirements: Mechanic Comments: was Independent prior to 3  to 4 weeks PTA ADL ADL Eating: Set up Where Assessed-Eating: Bed level Grooming: Setup Where Assessed-Grooming: Bed level Lower Body Dressing: Dependent ADL Comments: max A SB transfer w/c to bed due to pain, grooming tasks completed bed level, bathing and UB dressing to be assessed when patient able to tolerate Vision Baseline Vision/History: Wears glasses Wears Glasses: At all times Patient Visual Report: No change from baseline Vision Assessment?: No apparent visual deficits Perception  Perception: Within Functional Limits Praxis Praxis: Intact Cognition Overall Cognitive Status: Within Functional Limits for tasks assessed Arousal/Alertness: Awake/alert Orientation Level: Person;Place;Situation Person: Oriented Place: Oriented Situation: Oriented Year: 2021 Month: January Day of Week: Correct Memory: Appears intact Immediate Memory Recall: Sock;Blue;Bed Memory Recall Sock: Without Cue Memory Recall Blue: Without Cue Memory Recall Bed: Without Cue Awareness: Appears intact Problem Solving: Appears intact Safety/Judgment: Appears intact Sensation Sensation Additional Comments: UB sensation intact Coordination Fine Motor Movements are Fluid and Coordinated: Yes Finger Nose Finger Test: [facility] Motor  Motor Motor: Paraplegia Mobility  Bed Mobility Bed Mobility: Rolling Right;Rolling Left;Sit to Sidelying Right Rolling Right: Moderate Assistance - Patient 50-74% Rolling Left: Moderate Assistance - Patient 50-74% Sit to Sidelying Right: Total Assistance - Patient < 25%  Trunk/Postural Assessment  Postural  Control Postural Control: Deficits on evaluation  Balance Balance Balance Assessed: Yes Static Sitting Balance Static Sitting - Level of Assistance: 4: Min assist Dynamic Sitting Balance Dynamic Sitting - Level of Assistance: 3: Mod assist Sitting balance - Comments: pain limiting tolerance for unsupported sitting this am Extremity/Trunk Assessment RUE Assessment RUE Assessment: Within Functional Limits LUE Assessment LUE Assessment: Within Functional Limits     Refer to Care Plan for Long Term Goals  Recommendations for other services: None    Discharge Criteria: Patient will be discharged from OT if patient refuses treatment 3 consecutive times without medical reason, if treatment goals not met, if there is a change in medical status, if patient makes no progress towards goals or if patient is discharged from hospital.  The above assessment, treatment plan, treatment alternatives and goals were discussed and mutually agreed upon: by patient and by family  [name] 08/01/2019, 11:50 AM

## 2019-08-01 NOTE — Progress Notes (Signed)
Bilateral lower extremity venous duplex has been completed. Preliminary results can be found in CV Proc through chart review.   08/01/19 1:32 PM [name] RVT

## 2019-08-01 NOTE — Progress Notes (Signed)
Initial Nutrition Assessment  DOCUMENTATION CODES:   Obesity unspecified  INTERVENTION:   - Ensure Enlive po BID, each supplement provides 350 kcal and 20 grams of protein  - Encourage adequate PO intake  - MVI with minerals daily  NUTRITION DIAGNOSIS:   Increased nutrient needs related to wound healing as evidenced by estimated needs.  GOAL:   Patient will meet greater than or equal to 90% of their needs  MONITOR:   PO intake, Supplement acceptance, Labs, Weight trends, Skin  REASON FOR ASSESSMENT:   Malnutrition Screening Tool    ASSESSMENT:   54 year old male with PMH of remote tobacco abuse, right total hip arthroplasty and left total hip arthroplasty. Presented 07/25/19 with progressive weakness to lower extremities and back pain as well as left foot drop with urinary retention. MRI of lumbar spine showed sizable epidural abscess L3-4 4-5 but also extensive amount of abscess in the psoas muscle. Underwent decompressive lumbar laminectomies L3-4 4-5 for evacuation of epidural abscess with partial medial facetectomies and foraminotomies 07/25/19. Underwent CT aspiration of right psoas abscess per interventional radiology 07/25/19 showing MSSA bacteremia. Pt admitted to CIR on 1/05.   Spoke with pt and daughter at bedside. Pt reports that he is eating at every meal but is not eating 100%. Pt reports he is eating "a little over half" but pt's daughter shakes her head indicating that he is not eating that much.  Pt reports that for 2 weeks PTA, he was not eating or drinking anything. Pt reports he purchased Ensure shakes but did not like the flavor.  Pt reports that he typically eats 3 times a day. In the morning, he eats a biscuit from a fast food restaurant. For lunch, pt goes out to eat with his coworkers. For dinner, pt may cook at home (meat and potatoes) or get takeout. Pt reports that he has never eaten a lot at meals.  Pt and daughter report pt's UBW as between  270-280 lbs and that he last weighed this during the first week of December. RD obtained bed scale weight during visit: 241.9 lbs.  Reviewed weight history in chart. Pt with a weight loss of 8 kg since 07/17/19. This is a 6.8% weight loss in less than 1 month which is significant for timeframe. Pt is at risk for malnutrition if PO intake does not improve.  Discussed oral nutrition supplements with pt. Pt states that he is willing to try a supplement but that if he does not like it, he will not drink it. RD will trial Ensure Enlive.  Discussed importance of adequate protein and kcal intake during rehab. Pt reports that he does not want to gain any weight. Discussed need for pt to maintain current weight and maintain lean muscle mass.  Meal Completion: 100%  Medications reviewed and include: protonix, Klor-con 40 mEq BID, Prednisone, thiamine, IV abx  Labs reviewed: potassium 3.3  UOP: 1075 ml x 24 hours  NUTRITION - FOCUSED PHYSICAL EXAM:    Most Recent Value  Orbital Region  No depletion  Upper Arm Region  Mild depletion  Thoracic and Lumbar Region  No depletion  Buccal Region  No depletion  Temple Region  Mild depletion  Clavicle Bone Region  Mild depletion  Clavicle and Acromion Bone Region  Mild depletion  Scapular Bone Region  Unable to assess  Dorsal Hand  No depletion  Patellar Region  No depletion  Anterior Thigh Region  Mild depletion  Posterior Calf Region  Mild depletion  Edema (  RD Assessment)  Mild [BLE]  Hair  Reviewed  Eyes  Reviewed  Mouth  Reviewed  Skin  Reviewed  Nails  Reviewed       Diet Order:   Diet Order            Diet regular Room service appropriate? Yes; Fluid consistency: Thin  Diet effective now              EDUCATION NEEDS:   Education needs have been addressed  Skin:  Skin Assessment: Skin Integrity Issues: Stage II: medial buttocks, right buttocks Incisions: back Other: laceration right buttocks  Last BM:  07/31/19 medium type  7  Height:   Ht Readings from Last 1 Encounters:  07/24/19 6\' 1"  (1.854 m)    Weight:   Wt Readings from Last 1 Encounters:  07/27/19 109.9 kg    Ideal Body Weight:  83.6 kg  BMI:  31.95 kg/m^2  Estimated Nutritional Needs:   Kcal:  2500-2700  Protein:  120-135 grams  Fluid:  >/= 2.0 L    Gaynell Face, MS, RD, LDN Inpatient Clinical Dietitian Pager: [phone] Weekend/After Hours: [phone]

## 2019-08-01 NOTE — Progress Notes (Signed)
Inpatient Rehabilitation  Patient information reviewed and entered into eRehab system by Garry Nicolini M. [place] Lozito, M.A., CCC/SLP, PPS Coordinator.  Information including medical coding, functional ability and quality indicators will be reviewed and updated through discharge.    

## 2019-08-02 ENCOUNTER — Inpatient Hospital Stay (HOSPITAL_COMMUNITY): Payer: BC Managed Care – PPO

## 2019-08-02 ENCOUNTER — Inpatient Hospital Stay (HOSPITAL_COMMUNITY): Payer: BC Managed Care – PPO | Admitting: Occupational Therapy

## 2019-08-02 DIAGNOSIS — L8990 Pressure ulcer of unspecified site, unspecified stage: Secondary | ICD-10-CM | POA: Insufficient documentation

## 2019-08-02 NOTE — Progress Notes (Signed)
Occupational Therapy Session Note  Patient Details  Name: [name] MRN: [id number] Date of Birth: 10/05/1965  Today's Date: 08/02/2019 OT Individual Time: [id number] OT Individual Time Calculation (min): 66 min    Short Term Goals: Week 1:  OT Short Term Goal 1 (Week 1): patient will complete SB transfers with good clearance of bottom with min A and sit to stand with mod A OT Short Term Goal 2 (Week 1): patient will complete UB dressing/bathing and grooming with set up OT Short Term Goal 3 (Week 1): patient will complete LB bathing / dressing and toileting with mod A using assistive devices OT Short Term Goal 4 (Week 1): patient will complete weight shifts and follow back precautions independently  Skilled Therapeutic Interventions/Progress Updates:    Patient in bed, alert - reviewed options for bathing as IV is running most of the time prohibiting access to shower.  Patient states that nursing gave him a bath this am but he would like to wash his hair.  Completed LB dressing at bed level max A, foot wear is dependent.  Rolling to left in bed with CGA using bed rails.  Side lying to SSP with min/mod A.  Education regarding back precautions and spasticity.  Sit to stand from elevated bed surface with min/mod A.  He is able to maintain weight through both legs for SPT with RW to w/c with min A at slow rate.  Completed hair washing with plastic basin max A.  He completed grooming tasks seated at sink with set up.  Min A for management of hospital gown - did not donn shirt at this time due to IV.  He remained seated in w/c at close of session.  Reviewed and practiced weight shifts with good carryover demonstrated.  Seat belt alarm set and call bell in reach.    Therapy Documentation Precautions:  Precautions Precautions: Back, Fall Precaution Booklet Issued: Yes (comment) Precaution Comments: pt re-educated on precautions Restrictions Weight Bearing Restrictions: No General:   Vital  Signs:   Pain: Pain Assessment Pain Scale: 0-10 Pain Score: 2  Pain Location: Back Pain Orientation: Mid Pain Descriptors / Indicators: Discomfort Other Treatments:     Therapy/Group: Individual Therapy  [name] 08/02/2019, 10:38 AM

## 2019-08-02 NOTE — Progress Notes (Signed)
Physical Therapy Session Note  Patient Details  Name: [name] MRN: [id number] Date of Birth: [birth year]  Today's Date: 08/02/2019 PT Individual Time: 0930-1030 and 1540-1630  PT Individual Time Calculation (min): 60 min and 50 min  Short Term Goals: Week 1:  PT Short Term Goal 1 (Week 1): Patient will perform bed mobility with mod A. PT Short Term Goal 2 (Week 1): Patient will perform sit<>stand and stand pivot transfers with min A. PT Short Term Goal 3 (Week 1): Patient will initiate gait training.  Skilled Therapeutic Interventions/Progress Updates:     Session 1: Patient in w/c in room upon PT arrival. Patient alert and agreeable to PT session. Patient reported 7/10 low back pain with muscle spasms in gluteal region during session, RN made aware and provided a muscle relaxer during session. PT provided repositioning, rest breaks, and distraction as pain interventions throughout session.   Therapeutic Activity: Bed Mobility: Patient performed sit to supine with mod-min a for LE managment. Provided verbal cues for performing sitting to side-lying then log rolling to supine to maintain spinal precautions and reduced pain with mobility. Transfers: Patient performed sit to/from stand x4 in the // bars and a stand pivot transfer x1 using to RW with min-mod A. Provided verbal cues for hand placement, maintaining a flat back and bend at the hips, and controlled descent to maintain spinal precautions and manage pain with mobility.  Gait Training:  Patient ambulated 4 feet forwards and backwards x1 with mod A in the // bars and 8 feet forwards and backwards in the // bars x2 with mod progressing to min A. Ambulated with decreased B step length, decreased stance time and weight shift to the L, decreased R foot clearance, and heavy use of UEs, except on last trial following NMR see below. Provided verbal cues and manual facilitation for erect posture, increased L weight shift, and shoulder  depression.  Wheelchair Mobility:  Patient propelled wheelchair 68 feet with supervision using B UEs. Provided verbal cues for propulsion technique and turning technique. Limited distance due to increased in low back pain with propulsion.  Neuromuscular Re-ed: Patient performed standing balance and pre-gait in the // bars. Patient able to progress from B UE weight bearing to no UE weight bearing with static standing. Practiced stepping forward and backwards with each foot with light UE use x4. Required increased use of UEs when stepping with R foot. Provided multi-modal cues for B knee extension and L weight shift when stepping with the R.  Patient in bed at end of session with breaks locked, bed alarm set, and all needs within reach. Patient very motivated throughout session, however, educated patient on management of pain and fatigue with rest breaks during session, as patient's pain was 10/10 at end of session, RN made aware. Patient in agreement.   Session 2: Patient in bed with daughter at bedside upon PT arrival. Patient alert and agreeable to PT session. Patient reported 0/10 pain at beginning of session, and up to 10/10 low back pain with mobility during session, RN made aware. PT provided repositioning, rest breaks, and distraction as pain interventions throughout session.   Therapeutic Activity: Bed Mobility: Patient performed supine to/from sit with mod A with use of bedrails and HOB slightly elevated, as patient does not tolerate HOB flat. Provided verbal cues for pushging with opposite LE, reduced pulling with UEs, and moving hips and shoulders together throughout to maintain spinal precautions. Transfers: Patient performed sit to/from stand x2 with min-mod A  using a RW with the bed slightly elevated. Provided verbal cues for hand placement, bending at the hips and not the spine to lean forward with a straight back, and foot placement.  Gait Training:  Patient ambulated 62 feet using  RW with min-mod A. Ambulated with mild forward trunk lean, decreased step length and height R>L, decreased stance time on L, narrow BOS, and intermittent downward head gaze. Provided verbal cues for erect posture, increased L weight shift, and increased BOS.  Neuromuscular Re-ed: -Patient performed rolling R/L x3 without use of bed rails with blocked practice focused on hip and shoulder coordination for a straight back with log rolling, provided min A for LE placement for opposite foot to push and multi-modal cues for trunk synchronization. Patient with reduced pain with rolling each trial. -Patient performed B Knee flexion/extension with patient using a gait belt to self-assist with motion x5 each side. -Patient performed standing balance without UE support x20-30 sec with focus on LE gluteal and quad activation   Patient in bed with daughter at bedside at end of session with breaks locked, bed alarm set, and all needs within reach.    Therapy Documentation Precautions:  Precautions Precautions: Back, Fall Precaution Booklet Issued: Yes (comment) Precaution Comments: pt re-educated on precautions Restrictions Weight Bearing Restrictions: No    Therapy/Group: Individual Therapy  Chanan Detwiler L Theresa Wedel PT, DPT  08/02/2019, 12:21 PM

## 2019-08-02 NOTE — Progress Notes (Addendum)
[facility] PHYSICAL MEDICINE & REHABILITATION PROGRESS NOTE   Subjective/Complaints:   Pt reports slept longer last night, but sleep was "crap" due to stress/U/S Chaos. Woke up sweaty and stuck to the bed.   ROS; pt denies SOB, CP, Abd pain, HA, vision changes, N/V/D/Constipation.  Objective:   VAS [place] LOWER EXTREMITY VENOUS (DVT)  Result Date: 08/01/2019  Lower Venous Study Indications: Swelling.  Risk Factors: None identified. Comparison Study: No prior studies. Performing Technologist: Oliver Hum RVT  Examination Guidelines: A complete evaluation includes B-mode imaging, spectral Doppler, color Doppler, and power Doppler as needed of all accessible portions of each vessel. Bilateral testing is considered an integral part of a complete examination. Limited examinations for reoccurring indications may be performed as noted.  +---------+---------------+---------+-----------+----------+--------------+ RIGHT    CompressibilityPhasicitySpontaneityPropertiesThrombus Aging +---------+---------------+---------+-----------+----------+--------------+ CFV      Full           Yes      Yes                                 +---------+---------------+---------+-----------+----------+--------------+ SFJ      Full                                                        +---------+---------------+---------+-----------+----------+--------------+ FV Prox  Full                                                        +---------+---------------+---------+-----------+----------+--------------+ FV Mid   Full                                                        +---------+---------------+---------+-----------+----------+--------------+ FV DistalFull                                                        +---------+---------------+---------+-----------+----------+--------------+ PFV      Full                                                         +---------+---------------+---------+-----------+----------+--------------+ POP      Full           Yes      Yes                                 +---------+---------------+---------+-----------+----------+--------------+ PTV      Full                                                        +---------+---------------+---------+-----------+----------+--------------+  PERO     Full                                                        +---------+---------------+---------+-----------+----------+--------------+   +---------+---------------+---------+-----------+----------+--------------+ LEFT     CompressibilityPhasicitySpontaneityPropertiesThrombus Aging +---------+---------------+---------+-----------+----------+--------------+ CFV      Full           Yes      Yes                                 +---------+---------------+---------+-----------+----------+--------------+ SFJ      Full                                                        +---------+---------------+---------+-----------+----------+--------------+ FV Prox  Full                                                        +---------+---------------+---------+-----------+----------+--------------+ FV Mid   Full                                                        +---------+---------------+---------+-----------+----------+--------------+ FV DistalFull                                                        +---------+---------------+---------+-----------+----------+--------------+ PFV      Full                                                        +---------+---------------+---------+-----------+----------+--------------+ POP      Full           Yes      Yes                                 +---------+---------------+---------+-----------+----------+--------------+ PTV      Full                                                         +---------+---------------+---------+-----------+----------+--------------+ PERO     Full                                                        +---------+---------------+---------+-----------+----------+--------------+  Summary: Right: There is no evidence of deep vein thrombosis in the lower extremity. No cystic structure found in the popliteal fossa. Left: There is no evidence of deep vein thrombosis in the lower extremity. No cystic structure found in the popliteal fossa.  *See table(s) above for measurements and observations. Electronically signed by Curt Jews MD on 08/01/2019 at 4:40:35 PM.    Final    Recent Labs    08/01/19 0530  WBC 9.5  HGB 11.7*  HCT 35.1*  PLT 391   Recent Labs    08/01/19 0530  NA 138  K 3.3*  CL 104  CO2 23  GLUCOSE 97  BUN 10  CREATININE 0.70  CALCIUM 8.1*    Intake/Output Summary (Last 24 hours) at 08/02/2019 0845 Last data filed at 08/02/2019 0500 Gross per 24 hour  Intake 480 ml  Output 1400 ml  Net -920 ml     Physical Exam: Vital Signs Blood pressure 121/69, pulse 80, temperature 99.1 F (37.3 C), temperature source Oral, resp. rate 16, SpO2 98 %.  Nursing note and vitals reviewed. Constitutional:   Lying in bed; appropriate, nervous tapping of R hand on bed constant; PT in room working with pt, NAD HENT:  Head: Normocephalic and atraumatic.  Mouth/Throat: No oropharyngeal exudate.   Eyes:  EOM are normal.  Neck: No tracheal deviation present.  Cardiovascular: RRR  Respiratory: CTA B/L  GI: abd soft, NT, ND, (+)BS hypoactive  Genitourinary:    Genitourinary Comments: (+) foley in place; amber urine in bag   Musculoskeletal:     Cervical back: Normal range of motion and neck supple.     Comments: UEs- deltoid, biceps, triceps, WE grip and finger abd tested- strength 5/5 in UEs B/L  RLE- HF 2/5, KE 4/5, DF 4/5, PF 4/5, EHL 4/5 LLE- HF 2-/5, KE 2/5, DF 2/5, PF 2/5, EHL 2/5  No swelling and has good ROM of joints in all 4  extremities  Neurological: No cranial nerve deficit.  Patient is alert.  Mood is a bit flat but appropriate.  Oriented x3 and follows commands. Sensation to Light touch intact in all 4 extremities No increased tone in LEs Has absent LEs DTRs B/L  Skin:  Back incision is dressed with original honeycomb dressing- C/D/I No backside ulcers/skin breakdown seen  Psychiatric:  Appropriate, but flat    Assessment/Plan: 1. Functional deficits secondary to Incomplete cauda equina syndrome due to epidural abscess which require 3+ hours per day of interdisciplinary therapy in a comprehensive inpatient rehab setting.  Physiatrist is providing close team supervision and 24 hour management of active medical problems listed below.  Physiatrist and rehab team continue to assess barriers to discharge/monitor patient progress toward functional and medical goals  Care Tool:  Bathing  Bathing activity did not occur: Safety/medical concerns           Bathing assist       Upper Body Dressing/Undressing Upper body dressing Upper body dressing/undressing activity did not occur (including orthotics): Safety/medical concerns      Upper body assist      Lower Body Dressing/Undressing Lower body dressing      What is the patient wearing?: Pants     Lower body assist Assist for lower body dressing: Dependent - Patient 0%     Toileting Toileting    Toileting assist Assist for toileting: Dependent - Patient 0%     Transfers Chair/bed transfer  Transfers assist  Chair/bed transfer activity did not occur: Safety/medical concerns(unable  without skilled intervention.)        Locomotion Ambulation   Ambulation assist   Ambulation activity did not occur: Safety/medical concerns(LE weakness and back pain in standing)          Walk 10 feet activity   Assist  Walk 10 feet activity did not occur: Safety/medical concerns(LE weakness and back pain in standing)        Walk 50  feet activity   Assist Walk 50 feet with 2 turns activity did not occur: Safety/medical concerns(LE weakness and back pain in standing)         Walk 150 feet activity   Assist Walk 150 feet activity did not occur: Safety/medical concerns(LE weakness and back pain in standing)         Walk 10 feet on uneven surface  activity   Assist Walk 10 feet on uneven surfaces activity did not occur: Safety/medical concerns(LE weakness and back pain in standing)         Wheelchair     Assist     Wheelchair activity did not occur: Safety/medical concerns(increased back pain with attempted propulsion)         Wheelchair 50 feet with 2 turns activity    Assist    Wheelchair 50 feet with 2 turns activity did not occur: Safety/medical concerns(increased back pain with attempted propulsion)       Wheelchair 150 feet activity     Assist  Wheelchair 150 feet activity did not occur: Safety/medical concerns(increased back pain with attempted propulsion)       Blood pressure 121/69, pulse 80, temperature 99.1 F (37.3 C), temperature source Oral, resp. rate 16, SpO2 98 %.  Medical Problem List and Plan: 1.  Bilateral lower extremity weakness secondary to incomplete cauda equina syndrome/epidural abscess status post decompressive lumbar laminectomies L3-4 4-5 with evacuation of epidural abscess [id number] complicated by lumbar vertebral infection and R psoas abscess s/p evacuation             -On Prednisone 15 mg daily- will need to check with [facility] about the taper plans.              -patient may  Shower if cover back incision             -ELOS/Goals: 2-3 weeks; Mod I 2.  Antithrombotics: -DVT/anticoagulation: SCDs.  Check vascular. -will see if can start Lovenox in setting of new SCI patient.  1/7- started Lovenox yesterday- Dopplers (-)             -antiplatelet therapy: N/A 3. Pain Management: Oxycodone and Flexeril as needed 4. Mood: Provide emotional support              -antipsychotic agents: N/A 5. Neuropsych: This patient is capable of making decisions on his own behalf. 6. Skin/Wound Care: Routine skin checks 7. Fluids/Electrolytes/Nutrition: Routine in and outs with follow-up chemistries 8.  ID/MSSA bacteremia complicated by lumbar vertebral infection.  Plan is for 6 weeks of IV nafcillin initiated 07/25/2019- last day 09/04/19 per ID.  9.  Urinary retention due to neurogenic bladder.  Start Flomax 0.4 mg QHS and then wait x 2-3 days, THEN remove Foley- will unlikely be able to void if removes foley immediately.   1/7- pt asked if can wait until tomorrow to remove foley- OK, but will remove in AM.  10. Insomnia- will order Trazodone 25-50 mg QHS prn for sleep.  1/6- didn't take last night- will ask for tonight 11. Questionable neurogenic bowel-  Will  monitor closely for bowel incontinence and possible "constipation" which could be due to neurogenic bowel.  12. Hypokalemia- was last 3.0-Don't see any evident that it was repleted- will give 1 dose of 40 mEq of Kcl- and recheck in AM   1/6- K+ 3.3- will replete 1 more time with 40 mEq x2 total. 13. Alcohol Abuse- pt counseled- doesn't agree with our assessment/dx      LOS: 2 days A FACE TO FACE EVALUATION WAS PERFORMED  Nuri Larmer 08/02/2019, 8:45 AM

## 2019-08-02 NOTE — Plan of Care (Signed)
  Problem: Consults Goal: RH SPINAL CORD INJURY PATIENT EDUCATION Description:  See Patient Education module for education specifics.  Outcome: Progressing Goal: Skin Care Protocol Initiated - if Braden Score 18 or less Description: If consults are not indicated, leave blank or document N/A Outcome: Progressing   Problem: SCI BOWEL ELIMINATION Goal: RH STG MANAGE BOWEL WITH ASSISTANCE Description: STG Manage Bowel with mod Assistance. Outcome: Progressing Goal: RH STG SCI MANAGE BOWEL WITH MEDICATION WITH ASSISTANCE Description: STG SCI Manage bowel with medication with mod assistance. Outcome: Progressing   Problem: SCI BLADDER ELIMINATION Goal: RH STG MANAGE BLADDER WITH ASSISTANCE Description: STG Manage Bladder With mod Assistance Outcome: Progressing Goal: RH STG MANAGE BLADDER WITH EQUIPMENT WITH ASSISTANCE Description: STG Manage Bladder With Equipment With max/total Assistance Outcome: Progressing   Problem: RH SKIN INTEGRITY Goal: RH STG SKIN FREE OF INFECTION/BREAKDOWN Description: Patients skin will remain free from further infection or breakdown with mod assist. Outcome: Progressing Goal: RH STG MAINTAIN SKIN INTEGRITY WITH ASSISTANCE Description: STG Maintain Skin Integrity With mod Assistance. Outcome: Progressing Goal: RH STG ABLE TO PERFORM INCISION/WOUND CARE W/ASSISTANCE Description: STG Able To Perform Incision/Wound Care With max/total Assistance. Outcome: Progressing   Problem: RH SAFETY Goal: RH STG ADHERE TO SAFETY PRECAUTIONS W/ASSISTANCE/DEVICE Description: STG Adhere to Safety Precautions With cues Assistance/Device. Outcome: Progressing   Problem: RH PAIN MANAGEMENT Goal: RH STG PAIN MANAGED AT OR BELOW PT'S PAIN GOAL Description: < 4 Outcome: Progressing   Problem: RH KNOWLEDGE DEFICIT SCI Goal: RH STG INCREASE KNOWLEDGE OF SELF CARE AFTER SCI Description: With mod assist. Outcome: Progressing

## 2019-08-03 ENCOUNTER — Inpatient Hospital Stay (HOSPITAL_COMMUNITY): Payer: BC Managed Care – PPO | Admitting: *Deleted

## 2019-08-03 ENCOUNTER — Inpatient Hospital Stay (HOSPITAL_COMMUNITY): Payer: BC Managed Care – PPO | Admitting: Occupational Therapy

## 2019-08-03 ENCOUNTER — Inpatient Hospital Stay (HOSPITAL_COMMUNITY): Payer: BC Managed Care – PPO | Admitting: Physical Therapy

## 2019-08-03 MED ORDER — BISACODYL 10 MG RE SUPP: 10.0000 mg | Freq: Every day | RECTAL | Status: DC | PRN

## 2019-08-03 MED ORDER — POLYETHYLENE GLYCOL 3350 17 G PO PACK: 17.0000 g | PACK | Freq: Every day | ORAL | Status: DC | PRN

## 2019-08-03 MED ORDER — SENNA 8.6 MG PO TABS
2.0000 | ORAL_TABLET | Freq: Every day | ORAL | Status: DC
  Administered 2019-08-03: 17.2 mg via ORAL
  Administered 2019-08-04: 8.6 mg via ORAL
  Administered 2019-08-05 – 2019-08-10 (×6): 17.2 mg via ORAL
  Filled 2019-08-03 (×8): qty 2

## 2019-08-03 NOTE — Progress Notes (Signed)
Recreational Therapy Session Note  Patient Details  Name: [name] MRN: [id number] Date of Birth: 12/06/1965 Today's Date: 08/03/2019 Time:  1003-10130 Pain: no c/o Skilled Therapeutic Interventions/Progress Updates: Met with pt at bedside today to discuss TR services.  Pt with limited interest but stated motivation to participate in therapies and is determined to get home soon and caring for himself.  Full eval deferred but will continue to monitor through team.  Therapy/Group: Individual Therapy  Linden Mikes 08/03/2019, 11:51 AM

## 2019-08-03 NOTE — IPOC Note (Signed)
Overall Plan of Care [facility]) Patient Details Name: [name] MRN: [id number] DOB: 11/06/1965  Admitting Diagnosis: Cauda equina syndrome [facility])  Hospital Problems: Principal Problem:   Cauda equina syndrome ([place]) Active Problems:   S/P lumbar laminectomy   Bacteremia due to methicillin susceptible Staphylococcus aureus (MSSA)   Lumbar discitis   Neurogenic bladder   Pressure injury of skin     Functional Problem List: Nursing Bladder, Bowel, Endurance, Medication Management, Motor, Nutrition, Pain, Safety, Skin Integrity, Sensory  PT Balance, Edema, Endurance, Motor, Pain, Perception, Safety, Sensory, Skin Integrity  OT Balance, Endurance, Motor, Pain, Skin Integrity  SLP    TR         Basic ADL's: OT Eating, Grooming, Bathing, Dressing, Toileting     Advanced  ADL's: OT Simple Meal Preparation, Light Housekeeping     Transfers: PT Bed Mobility, Bed to Chair, Furniture, Teacher, early years/pre, Metallurgist: PT Ambulation, Emergency planning/management officer, Stairs     Additional Impairments: OT    SLP        TR      Anticipated Outcomes Item Anticipated Outcome  Self Feeding independent  Swallowing      Basic self-care  min a  Toileting  min a   Bathroom Transfers CS  Bowel/Bladder  Mod assist  Transfers  CGA using LRAD  Locomotion  CGA using LRAD 50'  Communication     Cognition     Pain  < 4  Safety/Judgment  Supervision   Therapy Plan: PT Intensity: Minimum of 1-2 x/day ,45 to 90 minutes PT Frequency: 5 out of 7 days PT Duration Estimated Length of Stay: 2-3 weeks OT Intensity: Minimum of 1-2 x/day, 45 to 90 minutes OT Frequency: 5 out of 7 days OT Duration/Estimated Length of Stay: 2-3 weeks     Due to the current state of emergency, patients may not be receiving their 3-hours of Medicare-mandated therapy.   Team Interventions: Nursing Interventions Patient/Family Education, Pain Management, Bladder Management, Medication Management,  Discharge Planning, Bowel Management, Skin Care/Wound Management, Psychosocial Support, Disease Management/Prevention  PT interventions Ambulation/gait training, Discharge planning, Functional mobility training, Psychosocial support, Therapeutic Activities, Balance/vestibular training, Disease management/prevention, Neuromuscular re-education, Skin care/wound management, Therapeutic Exercise, Wheelchair propulsion/positioning, DME/adaptive equipment instruction, Pain management, Splinting/orthotics, UE/LE Strength taining/ROM, Community reintegration, Technical sales engineer stimulation, Patient/family education, IT trainer, UE/LE Coordination activities  OT Interventions Training and development officer, Discharge planning, Pain management, Self Care/advanced ADL retraining, Therapeutic Activities, Functional mobility training, Patient/family education, Skin care/wound managment, Therapeutic Exercise, Community reintegration, Engineer, drilling, UE/LE Strength taining/ROM  SLP Interventions    TR Interventions    SW/CM Interventions Discharge Planning, Psychosocial Support, Patient/Family Education   Barriers to Discharge MD  Medical stability, Home enviroment access/loayout, IV antibiotics, Incontinence, Neurogenic bowel and bladder, Lack of/limited family support and Behavior  Nursing      PT Home environment access/layout, Neurogenic Bowel & Bladder 2-3 STE without rails to ex-wife's home  OT      SLP      SW       Team Discharge Planning: Destination: PT-Home ,OT- Home , SLP-  Projected Follow-up: PT-Home health PT, OT-  Home health OT, SLP-  Projected Equipment Needs: PT-To be determined, OT-  , SLP-  Equipment Details: PT-Patient has RW, will continue to assess DME needs based on patient's functional progress, OT-owns commode and shower seat Patient/family involved in discharge planning: PT- Patient,  OT-Patient, Family member/caregiver, SLP-   MD ELOS: 2-3  weeks Medical Rehab  Prognosis:  Good Assessment:  Bilateral lower extremity weakness secondary to incompletecauda equina syndrome/epidural abscess status post decompressive lumbar laminectomies L3-4 4-5 with evacuation of epidural abscess [id number] complicated by lumbar vertebral infectionand R psoas abscess s/p evacuation, with neurogenic bowel and bladder- get foley out today and hopefully get pt voiding with Flomax; also having difficulties with insomnia, hypokalemia, and on Prednisone- Has hx of Alcohol Abuse  Goals CGA- min assist   See Team Conference Notes for weekly updates to the plan of care

## 2019-08-03 NOTE — Progress Notes (Signed)
Physical Therapy Session Note  Patient Details  Name: [name] MRN: [id number] Date of Birth: 1966/06/30  Today's Date: 08/03/2019 PT Individual Time: 1100-1155 PT Individual Time Calculation (min): 55 min   Short Term Goals: Week 1:  PT Short Term Goal 1 (Week 1): Patient will perform bed mobility with mod A. PT Short Term Goal 2 (Week 1): Patient will perform sit<>stand and stand pivot transfers with min A. PT Short Term Goal 3 (Week 1): Patient will initiate gait training.  Skilled Therapeutic Interventions/Progress Updates:   Pt in supine and agreeable to therapy, denies pain at rest but reports 10/10 pain in lower back w/ all active movements (premedicated). Pt states he performed a good amount of walking this AM. Supine>sit via log roll w/ mod-max assist. Pt moves very slowly and cautiously but adheres to back precautions well. Stand pivot to w/c w/ min-mod assist, pt heavily relying on UE support for balance. Pt self-propelled w/c to therapy gym w/ BUEs to work on UE strengthening and global endurance training. Kinetron performed in seated, 2 min x3 @ level 90 cm/sec to work on endurance and BLE strengthening. Added more resistance, level 70 cm/sec, 2 min x3 reps. Worked on BLE strengthening w/ seated therex, LAQs 2x10, heel slides w/ orange theraband resistance 1x5, and knee marches 3x5. Returned to room via w/c, total assist. Stand pivot back to EOB w/ min assist and sit>supine w/ max assist via log roll. Ended session in supine, all needs in reach. Ice applied to lower back for pain relief.   Therapy Documentation Precautions:  Precautions Precautions: Back, Fall Precaution Booklet Issued: Yes (comment) Precaution Comments: pt re-educated on precautions Restrictions Weight Bearing Restrictions: No  Therapy/Group: Individual Therapy  Afreen Siebels Clent Demark 08/03/2019, 12:06 PM

## 2019-08-03 NOTE — Progress Notes (Addendum)
Physical Therapy Session Note  Patient Details  Name: [name] MRN: [id number] Date of Birth: 20-Jan-1966  Today's Date: 08/03/2019 PT Individual Time: [id number] PT Individual Time Calculation (min): 64 min   Short Term Goals: Week 1:  PT Short Term Goal 1 (Week 1): Patient will perform bed mobility with mod A. PT Short Term Goal 2 (Week 1): Patient will perform sit<>stand and stand pivot transfers with min A. PT Short Term Goal 3 (Week 1): Patient will initiate gait training.  Skilled Therapeutic Interventions/Progress Updates: Patient presents supine in bed stating 0/10 ain but attempting to "warm up legs' because Iknew you were coming."  Ted hose and slipper socks donned in supine.  Education given for same.  Patient performed 3 x 10 AP and AAROM for HS.  Patient rolled to each side x 2-3 using side rails and mod A.  Patient educated on hook-lying position at least w/ 1 LE flexed and to roll hips and shoulders, remained in side-lying to pull up pants.  Patient performed log roll to right and performed sup to sit transfer w/ mod A for bilateral LEs. Patient required seated rest break for increased pain.  Patient states decreased pain w/ log roll but still at least 6/10.  Patient performed multiple sit to stand transfers from varying surface (bed, w/c) w/ min A and occasional mod A as reaches for RW.  Patient able to amb 20 -25' per trial including turns to return to seat.  Patient also performed 3 x 10 toe taps at RW to 3 3/4" platform.  Noted decreased foot clearance on right LE d/t increased pain to right ant thigh per patient demo.  Patient returned to bed semi-reclined w/ allneeds close and bed alarm on.     Therapy Documentation Precautions:  Precautions Precautions: Back, Fall Precaution Booklet Issued: Yes (comment) Precaution Comments: pt re-educated on precautions Restrictions Weight Bearing Restrictions: No General:   Vital Signs:   Pain: 10/10 w/ activity although  decreases to approx. 6/10 once reaches upright posture.  Patient states has received pain meds approx. 86' prior to therapy.      Therapy/Group: Individual Therapy  Ladoris Gene 08/03/2019, 9:53 AM

## 2019-08-03 NOTE — Progress Notes (Signed)
Occupational Therapy Session Note  Patient Details  Name: [name] MRN: [id number] Date of Birth: 08/24/1965  Today's Date: 08/03/2019 OT Individual Time: 1400-1500 OT Individual Time Calculation (min): 60 min   Short Term Goals: Week 1:  OT Short Term Goal 1 (Week 1): patient will complete SB transfers with good clearance of bottom with min A and sit to stand with mod A OT Short Term Goal 2 (Week 1): patient will complete UB dressing/bathing and grooming with set up OT Short Term Goal 3 (Week 1): patient will complete LB bathing / dressing and toileting with mod A using assistive devices OT Short Term Goal 4 (Week 1): patient will complete weight shifts and follow back precautions independently  Skilled Therapeutic Interventions/Progress Updates:    Pt greeted semi-reclined in bed and agreeable to OT treatment session. Pt reported urinal had spilled on him when he was trying to use it and agreeable to washing up. Pt completed bed mobility using log roll technique with Max A and increased time 2/2 pain. Pt needed rest break at EOB, then agreeable to stand. Mod A sit<>stand with RW. Worked on removing alternating UEs from RW to assist with pulling pants down. Pt then able to maintain standing long enough to wash peri-area and buttocks with min A for dynamic standing balance. Pt returned to sitting and OT educated on use of reacher to assist with doffing pants from ankles while maintaining back precautions. OT then demonstrated use of reacher and sock-aid for LB dressing. Pt demonstrated understanding but could use more practice. Sit<>stands with mod A and RW to assist with pulling up pants. LB there-ex seated EOB with 3 sets of 10 seated hip extension and knee extension. Pt stood in similar fashion and took side steps along EOB with min A and RW. Max A to return to supine. Pt left semi-reclined in bed with bed alarm on, call bell in reach, and needs met.   Therapy Documentation Precautions:   Precautions Precautions: Back, Fall Precaution Booklet Issued: Yes (comment) Precaution Comments: pt re-educated on precautions Restrictions Weight Bearing Restrictions: No Pain:   Pt would not give a pain number. He said it was more than a 5, but less than a 10.  Very stoic. Rest and repositioned for comfort.   Therapy/Group: Individual Therapy  Valma Cava 08/03/2019, 3:07 PM

## 2019-08-03 NOTE — Plan of Care (Signed)
  Problem: Consults Goal: RH SPINAL CORD INJURY PATIENT EDUCATION Description:  See Patient Education module for education specifics.  Outcome: Progressing Goal: Skin Care Protocol Initiated - if Braden Score 18 or less Description: If consults are not indicated, leave blank or document N/A Outcome: Progressing   Problem: SCI BOWEL ELIMINATION Goal: RH STG MANAGE BOWEL WITH ASSISTANCE Description: STG Manage Bowel with mod Assistance. Outcome: Progressing Goal: RH STG SCI MANAGE BOWEL WITH MEDICATION WITH ASSISTANCE Description: STG SCI Manage bowel with medication with mod assistance. Outcome: Progressing   Problem: SCI BLADDER ELIMINATION Goal: RH STG MANAGE BLADDER WITH ASSISTANCE Description: STG Manage Bladder With mod Assistance Outcome: Progressing Goal: RH STG MANAGE BLADDER WITH EQUIPMENT WITH ASSISTANCE Description: STG Manage Bladder With Equipment With max/total Assistance Outcome: Progressing   Problem: RH SKIN INTEGRITY Goal: RH STG SKIN FREE OF INFECTION/BREAKDOWN Description: Patients skin will remain free from further infection or breakdown with mod assist. Outcome: Progressing Goal: RH STG MAINTAIN SKIN INTEGRITY WITH ASSISTANCE Description: STG Maintain Skin Integrity With mod Assistance. Outcome: Progressing Goal: RH STG ABLE TO PERFORM INCISION/WOUND CARE W/ASSISTANCE Description: STG Able To Perform Incision/Wound Care With max/total Assistance. Outcome: Progressing   Problem: RH SAFETY Goal: RH STG ADHERE TO SAFETY PRECAUTIONS W/ASSISTANCE/DEVICE Description: STG Adhere to Safety Precautions With cues Assistance/Device. Outcome: Progressing   Problem: RH PAIN MANAGEMENT Goal: RH STG PAIN MANAGED AT OR BELOW PT'S PAIN GOAL Description: < 4 Outcome: Progressing   Problem: RH KNOWLEDGE DEFICIT SCI Goal: RH STG INCREASE KNOWLEDGE OF SELF CARE AFTER SCI Description: With mod assist. Outcome: Progressing

## 2019-08-03 NOTE — Progress Notes (Signed)
[facility] PHYSICAL MEDICINE & REHABILITATION PROGRESS NOTE   Subjective/Complaints:   Pt reports is concerned about taking sleeping medicine- doesn't want to "need" it- explained it's not habit forming- woke up q1 hours last night and slept poorly.  ROS; pt denies SOB, CP, Abd pain, HA, vision changes, N/V/D/Constipation.  Objective:   VAS [place] LOWER EXTREMITY VENOUS (DVT)  Result Date: 08/01/2019  Lower Venous Study Indications: Swelling.  Risk Factors: None identified. Comparison Study: No prior studies. Performing Technologist: Oliver Hum RVT  Examination Guidelines: A complete evaluation includes B-mode imaging, spectral Doppler, color Doppler, and power Doppler as needed of all accessible portions of each vessel. Bilateral testing is considered an integral part of a complete examination. Limited examinations for reoccurring indications may be performed as noted.  +---------+---------------+---------+-----------+----------+--------------+ RIGHT    CompressibilityPhasicitySpontaneityPropertiesThrombus Aging +---------+---------------+---------+-----------+----------+--------------+ CFV      Full           Yes      Yes                                 +---------+---------------+---------+-----------+----------+--------------+ SFJ      Full                                                        +---------+---------------+---------+-----------+----------+--------------+ FV Prox  Full                                                        +---------+---------------+---------+-----------+----------+--------------+ FV Mid   Full                                                        +---------+---------------+---------+-----------+----------+--------------+ FV DistalFull                                                        +---------+---------------+---------+-----------+----------+--------------+ PFV      Full                                                         +---------+---------------+---------+-----------+----------+--------------+ POP      Full           Yes      Yes                                 +---------+---------------+---------+-----------+----------+--------------+ PTV      Full                                                        +---------+---------------+---------+-----------+----------+--------------+  PERO     Full                                                        +---------+---------------+---------+-----------+----------+--------------+   +---------+---------------+---------+-----------+----------+--------------+ LEFT     CompressibilityPhasicitySpontaneityPropertiesThrombus Aging +---------+---------------+---------+-----------+----------+--------------+ CFV      Full           Yes      Yes                                 +---------+---------------+---------+-----------+----------+--------------+ SFJ      Full                                                        +---------+---------------+---------+-----------+----------+--------------+ FV Prox  Full                                                        +---------+---------------+---------+-----------+----------+--------------+ FV Mid   Full                                                        +---------+---------------+---------+-----------+----------+--------------+ FV DistalFull                                                        +---------+---------------+---------+-----------+----------+--------------+ PFV      Full                                                        +---------+---------------+---------+-----------+----------+--------------+ POP      Full           Yes      Yes                                 +---------+---------------+---------+-----------+----------+--------------+ PTV      Full                                                         +---------+---------------+---------+-----------+----------+--------------+ PERO     Full                                                        +---------+---------------+---------+-----------+----------+--------------+  Summary: Right: There is no evidence of deep vein thrombosis in the lower extremity. No cystic structure found in the popliteal fossa. Left: There is no evidence of deep vein thrombosis in the lower extremity. No cystic structure found in the popliteal fossa.  *See table(s) above for measurements and observations. Electronically signed by Curt Jews MD on 08/01/2019 at 4:40:35 PM.    Final    Recent Labs    08/01/19 0530  WBC 9.5  HGB 11.7*  HCT 35.1*  PLT 391   Recent Labs    08/01/19 0530  NA 138  K 3.3*  CL 104  CO2 23  GLUCOSE 97  BUN 10  CREATININE 0.70  CALCIUM 8.1*    Intake/Output Summary (Last 24 hours) at 08/03/2019 0920 Last data filed at 08/03/2019 0738 Gross per 24 hour  Intake 840 ml  Output 1650 ml  Net -810 ml     Physical Exam: Vital Signs Blood pressure 128/69, pulse 73, temperature 98.6 F (37 C), temperature source Oral, resp. rate 16, SpO2 97 %.  Nursing note and vitals reviewed. Constitutional:   Lying in bed; appropriate, nervous tapping of R hand on bed constant; talking more/more interactive; NAD HENT:  Head: Normocephalic and atraumatic.  Mouth/Throat: No oropharyngeal exudate.   Eyes:  EOM are normal.  Neck: No tracheal deviation present.  Cardiovascular: RRR  Respiratory: CTA B/L  GI: abd soft, NT, ND, (+)BS hypoactive  Genitourinary:    Genitourinary Comments: (+) foley in place; amber urine in bag   Musculoskeletal:     Cervical back: Normal range of motion and neck supple.     Comments: UEs- deltoid, biceps, triceps, WE grip and finger abd tested- strength 5/5 in UEs B/L RLE- HF 2/5, KE 4/5, DF 4/5, PF 4/5, EHL 4/5 LLE- HF 2-/5, KE 2/5, DF 2/5, PF 2/5, EHL 2/5  Neurological: No cranial nerve deficit.  Patient  is alert.  Mood is a bit flat but appropriate.  Oriented x3 and follows commands. Sensation to Light touch intact in all 4 extremities No increased tone in LEs Has absent LEs DTRs B/L  Skin:  Back incision is dressed with original honeycomb dressing- C/D/I No backside ulcers/skin breakdown seen  Psychiatric:  Appropriate, but flat    Assessment/Plan: 1. Functional deficits secondary to Incomplete cauda equina syndrome due to epidural abscess which require 3+ hours per day of interdisciplinary therapy in a comprehensive inpatient rehab setting.  Physiatrist is providing close team supervision and 24 hour management of active medical problems listed below.  Physiatrist and rehab team continue to assess barriers to discharge/monitor patient progress toward functional and medical goals  Care Tool:  Bathing  Bathing activity did not occur: Safety/medical concerns           Bathing assist       Upper Body Dressing/Undressing Upper body dressing Upper body dressing/undressing activity did not occur (including orthotics): Safety/medical concerns What is the patient wearing?: Hospital gown only    Upper body assist Assist Level: Minimal Assistance - Patient > 75%    Lower Body Dressing/Undressing Lower body dressing      What is the patient wearing?: Underwear/pull up, Pants     Lower body assist Assist for lower body dressing: Total Assistance - Patient < 25%     Toileting Toileting    Toileting assist Assist for toileting: Dependent - Patient 0%     Transfers Chair/bed transfer  Transfers assist  Chair/bed transfer activity did not occur: Safety/medical concerns(unable without  skilled intervention.)  Chair/bed transfer assist level: Minimal Assistance - Patient > 75% Chair/bed transfer assistive device: Programmer, multimedia   Ambulation assist   Ambulation activity did not occur: Safety/medical concerns(LE weakness and back pain in  standing)  Assist level: Moderate Assistance - Patient 50 - 74% Assistive device: Parallel bars Max distance: 8'   Walk 10 feet activity   Assist  Walk 10 feet activity did not occur: Safety/medical concerns(LE weakness and back pain in standing)        Walk 50 feet activity   Assist Walk 50 feet with 2 turns activity did not occur: Safety/medical concerns(LE weakness and back pain in standing)         Walk 150 feet activity   Assist Walk 150 feet activity did not occur: Safety/medical concerns(LE weakness and back pain in standing)         Walk 10 feet on uneven surface  activity   Assist Walk 10 feet on uneven surfaces activity did not occur: Safety/medical concerns(LE weakness and back pain in standing)         Wheelchair     Assist   Type of Wheelchair: Manual Wheelchair activity did not occur: Safety/medical concerns(increased back pain with attempted propulsion)  Wheelchair assist level: Supervision/Verbal cueing, Set up assist Max wheelchair distance: 19'    Wheelchair 50 feet with 2 turns activity    Assist    Wheelchair 50 feet with 2 turns activity did not occur: Safety/medical concerns(increased back pain with attempted propulsion)   Assist Level: Set up assist, Supervision/Verbal cueing   Wheelchair 150 feet activity     Assist  Wheelchair 150 feet activity did not occur: Safety/medical concerns(increased back pain with attempted propulsion)       Blood pressure 128/69, pulse 73, temperature 98.6 F (37 C), temperature source Oral, resp. rate 16, SpO2 97 %.  Medical Problem List and Plan: 1.  Bilateral lower extremity weakness secondary to incomplete cauda equina syndrome/epidural abscess status post decompressive lumbar laminectomies L3-4 4-5 with evacuation of epidural abscess 27/74/1287 complicated by lumbar vertebral infection and R psoas abscess s/p evacuation             -On Prednisone 15 mg daily- will need to check  with [facility] about the taper plans.              -patient may  Shower if cover back incision             -ELOS/Goals: 2-3 weeks; Mod I 2.  Antithrombotics: -DVT/anticoagulation: SCDs.  Check vascular. -will see if can start Lovenox in setting of new SCI patient.  1/7- started Lovenox yesterday- Dopplers (-)             -antiplatelet therapy: N/A 3. Pain Management: Oxycodone and Flexeril as needed 4. Mood: Provide emotional support             -antipsychotic agents: N/A 5. Neuropsych: This patient is capable of making decisions on his own behalf. 6. Skin/Wound Care: Routine skin checks 7. Fluids/Electrolytes/Nutrition: Routine in and outs with follow-up chemistries 8.  ID/MSSA bacteremia complicated by lumbar vertebral infection.  Plan is for 6 weeks of IV nafcillin initiated 07/25/2019- last day 09/04/19 per ID.   1/8- ordered labs weekly on Monday including CBC/CMP/ESR/CRP 9.  Urinary retention due to neurogenic bladder.  Start Flomax 0.4 mg QHS and then wait x 2-3 days, THEN remove Foley- will unlikely be able to void if removes foley immediately.   1/7-  pt asked if can wait until tomorrow to remove foley- OK, but will remove in AM.   1/8- will remove today 10. Insomnia- will order Trazodone 25-50 mg QHS prn for sleep.  1/8- didn't take last night- will ask for tonight- was concerned was addictive 11. Questionable neurogenic bowel-  Will monitor closely for bowel incontinence and possible "constipation" which could be due to neurogenic bowel.   1/8- LBM was 1/5- will ask nursing to give PRNs to get pt going. Also add Senokot 2 tabs qAM for bowels  12. Hypokalemia- was last 3.0-Don't see any evident that it was repleted- will give 1 dose of 40 mEq of Kcl- and recheck in AM   1/6- K+ 3.3- will replete 1 more time with 40 mEq x2 total. 13. Alcohol Abuse- pt counseled- doesn't agree with our assessment/dx      LOS: 3 days A FACE TO FACE EVALUATION WAS PERFORMED  Kailen Name 08/03/2019,  9:20 AM

## 2019-08-04 ENCOUNTER — Inpatient Hospital Stay (HOSPITAL_COMMUNITY): Payer: BC Managed Care – PPO

## 2019-08-04 ENCOUNTER — Inpatient Hospital Stay (HOSPITAL_COMMUNITY): Payer: BC Managed Care – PPO | Admitting: Physical Therapy

## 2019-08-04 NOTE — Progress Notes (Signed)
Occupational Therapy Session Note  Patient Details  Name: [name] MRN: [id number] Date of Birth: 19-Feb-1966  Today's Date: 08/04/2019 OT Individual Time: 4970-2637 OT Individual Time Calculation (min): 56 min    Short Term Goals: Week 1:  OT Short Term Goal 1 (Week 1): patient will complete SB transfers with good clearance of bottom with min A and sit to stand with mod A OT Short Term Goal 2 (Week 1): patient will complete UB dressing/bathing and grooming with set up OT Short Term Goal 3 (Week 1): patient will complete LB bathing / dressing and toileting with mod A using assistive devices OT Short Term Goal 4 (Week 1): patient will complete weight shifts and follow back precautions independently  Skilled Therapeutic Interventions/Progress Updates:    1;1. Pt received in bed. Pt states pain is tolerable but does not rate. Pt reports already washing off, grooming and toileting this date. Pt completes stand pivot transfer with RW and MIN A with VC for hand placement. OT manages IV pole. Pt propels w/c to/from tx gym with increased time for BUE endurance. Pt requesting BLE and UE therex for strengthenign required for BADLs/transfers  2x10 completed as follows with demo cueing: LAQ, ball squeezes, abduction of hip and marches 4# dowel Rod therex: shoulder flex/ext, elbow flex.ext, shoulder press, chest press, elbow flex/ext, horizontal ab/adduct  Pt returned to room seated in EOB exit alarm on, call light in reach and all needs met. RN aware of position and to check on pt if need A to lay back down  Therapy Documentation Precautions:  Precautions Precautions: Back, Fall Precaution Booklet Issued: Yes (comment) Precaution Comments: pt re-educated on precautions Restrictions Weight Bearing Restrictions: No General:   Vital Signs: Therapy Vitals Temp: 98.7 F (37.1 C) Temp Source: Oral Pulse Rate: 78 Resp: 16 BP: 124/70 Patient Position (if appropriate): Lying Oxygen  Therapy SpO2: 97 % O2 Device: Room Air Pain: Pain Assessment Pain Scale: 0-10 Pain Score: 8  Pain Type: Acute pain Pain Location: Back Pain Orientation: Mid;Lower Pain Descriptors / Indicators: Aching;Pressure Pain Frequency: Constant Pain Onset: On-going Patients Stated Pain Goal: 4 Pain Intervention(s): Medication (See eMAR) ADL: ADL Eating: Set up Where Assessed-Eating: Bed level Grooming: Setup Where Assessed-Grooming: Bed level Lower Body Dressing: Dependent ADL Comments: max A SB transfer w/c to bed due to pain, grooming tasks completed bed level, bathing and UB dressing to be assessed when patient able to tolerate Vision   Perception    Praxis   Exercises:   Other Treatments:     Therapy/Group: Individual Therapy  Tonny Branch 08/04/2019, 7:33 AM

## 2019-08-04 NOTE — Progress Notes (Signed)
Physical Therapy Session Note  Patient Details  Name: [name] MRN: [id number] Date of Birth: Aug 03, 1965  Today's Date: 08/04/2019 PT Individual Time: 1305-1403 PT Individual Time Calculation (min): 58 min   Short Term Goals: Week 1:  PT Short Term Goal 1 (Week 1): Patient will perform bed mobility with mod A. PT Short Term Goal 2 (Week 1): Patient will perform sit<>stand and stand pivot transfers with min A. PT Short Term Goal 3 (Week 1): Patient will initiate gait training.  Skilled Therapeutic Interventions/Progress Updates:    Pt received supine in bed and agreeable to therapy session. RN present administering pain medication. Pt able to recall no pushing/pulling spine precaution but required cuing to recall no bending or twisting although he is able to recall need for logroll technique bed mobility. Supine>sit, HOB partially elevated and relying heavily on bedrail, via logroll technique with CGA for steadying and increased time. Stand pivot transfer elevated EOB>w/c using RW with min assist for lifting and steadying while turning.  Transported to/from gym in w/c for energy conservation. Sit<>stands using RW throughout session with CGA/min assist and pt demonstrating heavy reliance on B UE support. Ambulated 58ft using RW with min assist for safety - pt demonstrating heavy reliance on B UE weightbearing as well as significantly decreased step length with B LE foot supination and significantly decreased gait speed. Performed static standing task targeting no UE support to maintain upright while placing clothespins on basketball goal with pt able to maintain standing with CGA for ~30minutes with no knee instability noted. Ambulated 17ft using RW with min assist for safety and pt demonstrating slightly less reliance on B UE support initially but then reverting to increased UE support with increased distance - continues to demonstrate above gait impairments. Stand pivot w/c<>EOM using RW with  CGA/min assist - continued heavy reliance on B UE support. Performed repeated sit<>stands to/from significantly elevated EOM with pt initially using light UE support on RW progressed to light B HHA from therapist with CGA for steadying and pt demonstrating ability to come to standing without compensation from UEs. Performed seated B LE ankle DF and eversion against level 2 theraband resistance with pt demonstrating increased weakness in L LE compared to R. Transported back to room. Stand pivot w/c>EOB using RW with CGA for steadying. Pt left seated EOB with needs in reach, bed alarm on, and lines intact.   Therapy Documentation Precautions:  Precautions Precautions: Back, Fall Precaution Booklet Issued: Yes (comment) Precaution Comments: pt re-educated on precautions Restrictions Weight Bearing Restrictions: No  Pain: Pt with low back pain and intermittent muscle pain/soreness during session - provided rest, repositioning, and RN administered medication at beginning of session.   Therapy/Group: Individual Therapy  Tawana Scale, PT, DPT 08/04/2019, 7:57 AM

## 2019-08-04 NOTE — Progress Notes (Signed)
[facility] PHYSICAL MEDICINE & REHABILITATION PROGRESS NOTE   Subjective/Complaints:  Feels pain has improved since earlier in the week  ROS; pt denies SOB, CP, Abd pain, HA, vision changes, N/V/D/Constipation.  Objective:   No results found. No results for input(s): WBC, HGB, HCT, PLT in the last 72 hours. No results for input(s): NA, K, CL, CO2, GLUCOSE, BUN, CREATININE, CALCIUM in the last 72 hours.  Intake/Output Summary (Last 24 hours) at 08/04/2019 0921 Last data filed at 08/04/2019 0700 Gross per 24 hour  Intake 843.34 ml  Output 820 ml  Net 23.34 ml     Physical Exam: Vital Signs Blood pressure 124/70, pulse 78, temperature 98.7 F (37.1 C), temperature source Oral, resp. rate 16, SpO2 97 %.  Nursing note and vitals reviewed. Constitutional: He appears well-developed and well-nourished.  Lying in bed; appropriate, NAD HENT:  Head: Normocephalic and atraumatic.  Mouth/Throat: No oropharyngeal exudate.   Eyes:  EOM are normal.  Neck: No tracheal deviation present.  Cardiovascular: RRR  Respiratory: CTA B/L  GI: abd soft, NT, ND, (+)BS hypoactive  Genitourinary:    Genitourinary Comments: (+) foley in place; amber urine in bag   Musculoskeletal:     Cervical back: Normal range of motion and neck supple.     Comments: UEs- deltoid, biceps, triceps, WE grip and finger abd tested- strength 5/5 in UEs B/L  RLE- HF 2/5, KE 4/5, DF 4/5, PF 4/5, EHL 4/5 LLE- HF 2-/5, KE 2/5, DF 2/5, PF 2/5, EHL 2/5  No swelling and has good ROM of joints in all 4 extremities  Neurological: No cranial nerve deficit.  Patient is alert.  Mood is a bit flat but appropriate.  Oriented x3 and follows commands. Sensation to Light touch intact in all 4 extremities No increased tone in LEs Has absent LEs DTRs B/L  Skin:  Back incision is dressed with original honeycomb dressing- C/D/I No backside ulcers/skin breakdown seen  Psychiatric:  Appropriate, but flat    Assessment/Plan: 1.  Functional deficits secondary to Incomplete cauda equina syndrome due to epidural abscess which require 3+ hours per day of interdisciplinary therapy in a comprehensive inpatient rehab setting.  Physiatrist is providing close team supervision and 24 hour management of active medical problems listed below.  Physiatrist and rehab team continue to assess barriers to discharge/monitor patient progress toward functional and medical goals  Care Tool:  Bathing  Bathing activity did not occur: Safety/medical concerns           Bathing assist       Upper Body Dressing/Undressing Upper body dressing Upper body dressing/undressing activity did not occur (including orthotics): Safety/medical concerns What is the patient wearing?: Hospital gown only    Upper body assist Assist Level: Minimal Assistance - Patient > 75%    Lower Body Dressing/Undressing Lower body dressing      What is the patient wearing?: Underwear/pull up, Pants     Lower body assist Assist for lower body dressing: Total Assistance - Patient < 25%     Toileting Toileting    Toileting assist Assist for toileting: Dependent - Patient 0%     Transfers Chair/bed transfer  Transfers assist  Chair/bed transfer activity did not occur: Safety/medical concerns(unable without skilled intervention.)  Chair/bed transfer assist level: Moderate Assistance - Patient 50 - 74% Chair/bed transfer assistive device: Programmer, multimedia   Ambulation assist   Ambulation activity did not occur: Safety/medical concerns(LE weakness and back pain in standing)  Assist level: Moderate  Assistance - Patient 50 - 74% Assistive device: Walker-rolling Max distance: 25'   Walk 10 feet activity   Assist  Walk 10 feet activity did not occur: Safety/medical concerns(LE weakness and back pain in standing)  Assist level: Moderate Assistance - Patient - 50 - 74% Assistive device: Walker-rolling   Walk 50 feet  activity   Assist Walk 50 feet with 2 turns activity did not occur: Safety/medical concerns         Walk 150 feet activity   Assist Walk 150 feet activity did not occur: Safety/medical concerns         Walk 10 feet on uneven surface  activity   Assist Walk 10 feet on uneven surfaces activity did not occur: Safety/medical concerns         Wheelchair     Assist   Type of Wheelchair: Manual Wheelchair activity did not occur: Safety/medical concerns(increased back pain with attempted propulsion)  Wheelchair assist level: Supervision/Verbal cueing Max wheelchair distance: 250'    Wheelchair 50 feet with 2 turns activity    Assist    Wheelchair 50 feet with 2 turns activity did not occur: Safety/medical concerns(increased back pain with attempted propulsion)   Assist Level: Supervision/Verbal cueing   Wheelchair 150 feet activity     Assist  Wheelchair 150 feet activity did not occur: Safety/medical concerns(increased back pain with attempted propulsion)   Assist Level: Supervision/Verbal cueing   Blood pressure 124/70, pulse 78, temperature 98.7 F (37.1 C), temperature source Oral, resp. rate 16, SpO2 97 %.  Medical Problem List and Plan: 1.  Bilateral lower extremity weakness secondary to incomplete cauda equina syndrome/epidural abscess status post decompressive lumbar laminectomies L3-4 4-5 with evacuation of epidural abscess [id number] complicated by lumbar vertebral infection and R psoas abscess s/p evacuation             -On Prednisone 15 mg daily-              -patient may  Shower if cover back incision             -ELOS/Goals: 2-3 weeks; Mod I 2.  Antithrombotics: -DVT/anticoagulation: SCDs.  Check vascular. -will see if can start Lovenox in setting of new SCI patient.              -antiplatelet therapy: N/A 3. Pain Management: Oxycodone and Flexeril as needed Much improved, no pain at rest  4. Mood: Provide emotional support              -antipsychotic agents: N/A 5. Neuropsych: This patient is capable of making decisions on his own behalf. 6. Skin/Wound Care: Routine skin checks 7. Fluids/Electrolytes/Nutrition: Routine in and outs with follow-up chemistries 8.  ID/MSSA bacteremia complicated by lumbar vertebral infection.  Plan is for 6 weeks of IV nafcillin initiated 07/25/2019- last day 09/04/19 per ID.  9.  Urinary retention due to neurogenic bladder.  Start Flomax 0.4 mg QHS and then wait x 2-3 days, THEN remove Foley- will unlikely be able to void if removes foley immediately.  10. Insomnia- will order Trazodone 25-50 mg QHS prn for sleep.  1/6- didn't take last night- will ask for tonight 11. Questionable neurogenic bowel-  Will monitor closely for bowel incontinence and possible "constipation" which could be due to neurogenic bowel.  12. Hypokalemia- was last 3.0-Don't see any evident that it was repleted- will give 1 dose of 40 mEq of Kcl- and recheck in AM   1/6- K+ 3.3- will replete 1 more time with  40 mEq x2 total. 13. Alcohol Abuse- pt counseled- doesn't agree with our assessment/dx      LOS: 4 days A FACE TO FACE EVALUATION WAS PERFORMED  Charlett Blake 08/04/2019, 9:21 AM

## 2019-08-05 ENCOUNTER — Inpatient Hospital Stay (HOSPITAL_COMMUNITY): Payer: BC Managed Care – PPO | Admitting: Physical Therapy

## 2019-08-05 NOTE — Progress Notes (Signed)
Physical Therapy Session Note  Patient Details  Name: [name] MRN: [id number] Date of Birth: 10/30/1965  Today's Date: 08/05/2019 PT Individual Time: 1400-1500 PT Individual Time Calculation (min): 60 min   Short Term Goals: Week 1:  PT Short Term Goal 1 (Week 1): Patient will perform bed mobility with mod A. PT Short Term Goal 2 (Week 1): Patient will perform sit<>stand and stand pivot transfers with min A. PT Short Term Goal 3 (Week 1): Patient will initiate gait training.  Skilled Therapeutic Interventions/Progress Updates:  Pt was seen bedside in the pm. Pt transferred supine to edge of bed with min A and verbal cues. Pt transferred edge of bed to w/c with rolling walker and min A. Pt propelled w/c about 25 feet with B LEs and S for LE strengthening. Treatment in gym focused on LE strengthening. Pt performed hip flex and LAQs, 3 sets x 10 reps each. Pt ambulated 60 feet with rolling walker and min A with verbal cues. Pt returned to room. Pt transferred w/c to edge of bed with min A and verbal cues. Pt transferred edge of bed to supine with mod A and verbal cues. Pt left sitting up in bed with call bell within reach and bed alarm on.   Therapy Documentation Precautions:  Precautions Precautions: Back, Fall Precaution Booklet Issued: Yes (comment) Precaution Comments: pt re-educated on precautions Restrictions Weight Bearing Restrictions: No General:   Pain: No c/o pain rest, pain with movement.   Therapy/Group: Individual Therapy  Dub Amis 08/05/2019, 3:14 PM

## 2019-08-05 NOTE — Progress Notes (Signed)
[facility] PHYSICAL MEDICINE & REHABILITATION PROGRESS NOTE   Subjective/Complaints:  No issues overnite   ROS; pt denies SOB, CP, Abd pain, HA, vision changes, N/V/D/Constipation.  Objective:   No results found. No results for input(s): WBC, HGB, HCT, PLT in the last 72 hours. No results for input(s): NA, K, CL, CO2, GLUCOSE, BUN, CREATININE, CALCIUM in the last 72 hours.  Intake/Output Summary (Last 24 hours) at 08/05/2019 0900 Last data filed at 08/05/2019 0630 Gross per 24 hour  Intake 480 ml  Output 1800 ml  Net -1320 ml     Physical Exam: Vital Signs Blood pressure 140/82, pulse 79, temperature 98.9 F (37.2 C), temperature source Oral, resp. rate 18, SpO2 97 %.  Nursing note and vitals reviewed. Constitutional: He appears well-developed and well-nourished.  Lying in bed; appropriate, NAD HENT:  Head: Normocephalic and atraumatic.  Mouth/Throat: No oropharyngeal exudate.   Eyes:  EOM are normal.  Neck: No tracheal deviation present.  Cardiovascular: RRR  Respiratory: CTA B/L  GI: abd soft, NT, ND, (+)BS hypoactive  Genitourinary:    Genitourinary Comments: (+) foley in place; amber urine in bag   Musculoskeletal:     Cervical back: Normal range of motion and neck supple.     Comments: UEs- deltoid, biceps, triceps, WE grip and finger abd tested- strength 5/5 in UEs B/L  RLE- HF 2/5, KE 4/5, DF 4/5, PF 4/5, EHL 4/5 LLE- HF 2-/5, KE 2/5, DF 2/5, PF 2/5, EHL 2/5  No swelling and has good ROM of joints in all 4 extremities  Neurological: No cranial nerve deficit.  Patient is alert.  Mood is a bit flat but appropriate.  Oriented x3 and follows commands. Sensation to Light touch intact in all 4 extremities No increased tone in LEs Has absent LEs DTRs B/L  Skin:  Back incision is dressed with original honeycomb dressing- C/D/I No backside ulcers/skin breakdown seen  Psychiatric:  Appropriate, but flat    Assessment/Plan: 1. Functional deficits secondary to  Incomplete cauda equina syndrome due to epidural abscess which require 3+ hours per day of interdisciplinary therapy in a comprehensive inpatient rehab setting.  Physiatrist is providing close team supervision and 24 hour management of active medical problems listed below.  Physiatrist and rehab team continue to assess barriers to discharge/monitor patient progress toward functional and medical goals  Care Tool:  Bathing  Bathing activity did not occur: Safety/medical concerns           Bathing assist       Upper Body Dressing/Undressing Upper body dressing Upper body dressing/undressing activity did not occur (including orthotics): Safety/medical concerns What is the patient wearing?: Hospital gown only    Upper body assist Assist Level: Minimal Assistance - Patient > 75%    Lower Body Dressing/Undressing Lower body dressing      What is the patient wearing?: Underwear/pull up, Pants     Lower body assist Assist for lower body dressing: Total Assistance - Patient < 25%     Toileting Toileting    Toileting assist Assist for toileting: Dependent - Patient 0%     Transfers Chair/bed transfer  Transfers assist  Chair/bed transfer activity did not occur: Safety/medical concerns(unable without skilled intervention.)  Chair/bed transfer assist level: Minimal Assistance - Patient > 75% Chair/bed transfer assistive device: Programmer, multimedia   Ambulation assist   Ambulation activity did not occur: Safety/medical concerns(LE weakness and back pain in standing)  Assist level: Minimal Assistance - Patient > 75% Assistive  device: Walker-rolling Max distance: 46ft   Walk 10 feet activity   Assist  Walk 10 feet activity did not occur: Safety/medical concerns(LE weakness and back pain in standing)  Assist level: Minimal Assistance - Patient > 75% Assistive device: Walker-rolling   Walk 50 feet activity   Assist Walk 50 feet with 2 turns activity  did not occur: Safety/medical concerns  Assist level: Minimal Assistance - Patient > 75% Assistive device: Walker-rolling    Walk 150 feet activity   Assist Walk 150 feet activity did not occur: Safety/medical concerns         Walk 10 feet on uneven surface  activity   Assist Walk 10 feet on uneven surfaces activity did not occur: Safety/medical concerns         Wheelchair     Assist   Type of Wheelchair: Manual Wheelchair activity did not occur: Safety/medical concerns(increased back pain with attempted propulsion)  Wheelchair assist level: Supervision/Verbal cueing Max wheelchair distance: 250'    Wheelchair 50 feet with 2 turns activity    Assist    Wheelchair 50 feet with 2 turns activity did not occur: Safety/medical concerns(increased back pain with attempted propulsion)   Assist Level: Supervision/Verbal cueing   Wheelchair 150 feet activity     Assist  Wheelchair 150 feet activity did not occur: Safety/medical concerns(increased back pain with attempted propulsion)   Assist Level: Supervision/Verbal cueing   Blood pressure 140/82, pulse 79, temperature 98.9 F (37.2 C), temperature source Oral, resp. rate 18, SpO2 97 %.  Medical Problem List and Plan: 1.  Bilateral lower extremity weakness secondary to incomplete cauda equina syndrome/epidural abscess status post decompressive lumbar laminectomies L3-4 4-5 with evacuation of epidural abscess [id number] complicated by lumbar vertebral infection and R psoas abscess s/p evacuation             -On Prednisone 15 mg daily-              -patient may  Shower if cover back incision             -ELOS/Goals: 2-3 weeks; Mod I 2.  Antithrombotics: -DVT/anticoagulation: SCDs.  Check vascular. -will see if can start Lovenox in setting of new SCI patient.              -antiplatelet therapy: N/A 3. Pain Management: Oxycodone and Flexeril as needed Much improved, no pain at rest  4. Mood: Provide  emotional support             -antipsychotic agents: N/A 5. Neuropsych: This patient is capable of making decisions on his own behalf. 6. Skin/Wound Care: Routine skin checks 7. Fluids/Electrolytes/Nutrition: Routine in and outs with follow-up chemistries 8.  ID/MSSA bacteremia complicated by lumbar vertebral infection.  Plan is for 6 weeks of IV nafcillin initiated 07/25/2019- last day 09/04/19 per ID.  9.  Urinary retention due to neurogenic bladder.  Start Flomax 0.4 mg QHS and then wait x 2-3 days, Voiding with Low PVRs no incont  10. Insomnia- will order Trazodone 25-50 mg QHS prn for sleep.  1/6- didn't take last night- will ask for tonight 11. Questionable neurogenic bowel-  Will monitor closely for bowel incontinence and possible "constipation" which could be due to neurogenic bowel.  12. Hypokalemia- was last 3.0-Don't see any evident that it was repleted- will give 1 dose of 40 mEq of Kcl- and recheck in AM   1/6- K+ 3.3- will replete 1 more time with 40 mEq x2 total.- CMP in am  13.  Alcohol Abuse- pt counseled- doesn't agree with our assessment/dx      LOS: 5 days A FACE TO FACE EVALUATION WAS PERFORMED  Charlett Blake 08/05/2019, 9:00 AM

## 2019-08-05 NOTE — Progress Notes (Signed)
Social Work Assessment and Plan   Patient Details  Name: [name] MRN: [id number] Date of Birth: [birth year]  Today's Date: 08/03/2019  Problem List:  Patient Active Problem List   Diagnosis Date Noted  . Pressure injury of skin 08/02/2019  . Cauda equina syndrome ([place]) 07/31/2019  . Neurogenic bladder 07/31/2019  . Bacteremia due to methicillin susceptible Staphylococcus aureus (MSSA) 07/30/2019  . Lumbar discitis 07/30/2019  . Cerebral septic emboli ([place]) 07/30/2019  . S/P lumbar laminectomy 07/25/2019  . Epidural abscess, L2-L5 07/25/2019  . Polymyalgia rheumatica ([place]) 07/24/2019  . Acute bilateral low back pain without sciatica 07/18/2019  . Abdominal pain 07/18/2019  . Anemia 07/18/2019  . Obesity (BMI 35.0-39.9 without comorbidity) 07/18/2019  . Constipation 07/18/2019  . Bilateral leg pain 07/12/2019  . Bilateral leg edema 04/01/2017  . Elevated blood sugar 04/01/2017  . Avascular necrosis of bone of hip, left ([place]) 10/12/2016  . Alcohol abuse, daily use 10/12/2016  . S/P total hip arthroplasty 10/12/2016   Past Medical History:  Past Medical History:  Diagnosis Date  . History of chicken pox   . Medical history non-contributory    Past Surgical History:  Past Surgical History:  Procedure Laterality Date  . CARPAL TUNNEL RELEASE Bilateral 2009  . COLONOSCOPY W/ POLYPECTOMY    . JOINT REPLACEMENT    . LACERATION REPAIR Right ~ 2012   leg  . LUMBAR LAMINECTOMY/DECOMPRESSION MICRODISCECTOMY N/A 07/24/2019   Procedure: Decompressive Lumbar Laminectomy Lumbar three-four Lumbar four-five for Epidural Abscess;  Surgeon: Kary Kos, MD;  Location: [facility];  Service: Neurosurgery;  Laterality: N/A;  . TOTAL HIP ARTHROPLASTY Right 01/11/2013   Procedure: TOTAL HIP ARTHROPLASTY;  Surgeon: Garald Balding, MD;  Location: [facility];  Service: Orthopedics;  Laterality: Right;  . TOTAL HIP ARTHROPLASTY Left 10/12/2016   Procedure: TOTAL HIP ARTHROPLASTY;  Surgeon: Garald Balding, MD;  Location: [facility];  Service: Orthopedics;  Laterality: Left;   Social History:  reports that he quit smoking about 20 years ago. His smoking use included cigarettes. He has a 14.00 pack-year smoking history. He has never used smokeless tobacco. He reports current alcohol use of about 24.0 standard drinks of alcohol per week. He reports that he does not use drugs.  Family / Support Systems Marital Status: Separated How Long?: 2 yrs Patient Roles: Spouse, Parent Spouse/Significant Other: wife, Manuela Schwartz Children: daughter, Indra Okelley @ [phone] Anticipated Caregiver: wife, Manuela Schwartz ( separated), daughter Ander Purpura and dtr in law Ability/Limitations of Caregiver: family work but will work out a schedule; [name] is a CNA and is aware will need to admninster IV antibiotics Caregiver Availability: 24/7 Family Dynamics: Patient notes that he and wife are separated, however, feels he can depend on her and other family members to provide any needed assistance.  Social History Preferred language: English Religion: Christian Cultural Background: N/A Read: Yes Write: Yes Employment Status: Employed Name of Employer: [employer] Return to Work Plans: Patient fully intends to return to work as soon as he is medically able to do so. Legal History/Current Legal Issues: None Guardian/Conservator: None-per MD, patient is capable of making decisions on his own behalf.   Abuse/Neglect Abuse/Neglect Assessment Can Be Completed: Yes Physical Abuse: Denies Verbal Abuse: Denies Sexual Abuse: Denies Exploitation of patient/patient's resources: Denies Self-Neglect: Denies  Emotional Status Pt's affect, behavior and adjustment status: Patient lying in bed and agreeable to intake assessment.  Affect is rather flat throughout our discussion, however, patient denies any significant emotional  distress.  Patient does admit much frustration with his limitations.  We will monitor mood  during his CIR stay and refer for neuropsychology as indicated. Recent Psychosocial Issues: None Psychiatric History: None Substance Abuse History: None  Patient / Family Perceptions, Expectations & Goals Pt/Family understanding of illness & functional limitations: Patient and family with good, general understanding of his medical issues/epidural abscess, surgery performed and current functional limitations. Premorbid pt/family roles/activities: Patient was completely independent PTA and working full-time as a Dealer. Anticipated changes in roles/activities/participation: Patient's daughter likely to assume primary caregiver role. Pt/family expectations/goals: Patient is eager to, "get through this and get back to work".  Community Resources [employer]s: None Premorbid Home Care/DME Agencies: None Transportation available at discharge: Yes  Discharge Planning Living Arrangements: Alone Support Systems: Children Type of Residence: Private residence Insurance Resources: Multimedia programmer (specify)(BC BS of [employer]) Financial Resources: Employment Financial Screen Referred: No Does the patient have any problems obtaining your medications?: No Home Management: Patient was independent with home management Patient/Family Preliminary Plans: Patient plans to discharge to his wife's home.  Wife and daughter to provide any needed assistance. Social Work Anticipated Follow Up Needs: HH/OP Expected length of stay: ELOS 2 to 3 weeks  Clinical Impression Unfortunate gentleman here following the development of an epidural abscess and undergoing back surgery.  He is very frustrated with his current functional limitations and eager to return to his prior level of independence.  Of note, he is separated from his wife, however, she will be one of the designated caregivers at discharge with plans for him to return to her home.  Patient denies any significant emotional distress.  We will monitor  throughout CIR stay and refer to neuropsychology as indicated.  Johnsie Moscoso 08/03/2019, 12:42 PM

## 2019-08-05 NOTE — Care Management (Signed)
[facility] Individual Statement of Services  Patient Name:  [name]  Date:  08/03/2019  Welcome to the [facility].  Our goal is to provide you with an individualized program based on your diagnosis and situation, designed to meet your specific needs.  With this comprehensive rehabilitation program, you will be expected to participate in at least 3 hours of rehabilitation therapies Monday-Friday, with modified therapy programming on the weekends.  Your rehabilitation program will include the following services:  Physical Therapy (PT), Occupational Therapy (OT), 24 hour per day rehabilitation nursing, Neuropsychology, Case Management (Social Worker), Rehabilitation Medicine, Nutrition Services and Pharmacy Services  Weekly team conferences will be held on Tuesdays to discuss your progress.  Your Social Worker will talk with you frequently to get your input and to update you on team discussions.  Team conferences with you and your family in attendance may also be held.  Expected length of stay: 2-3 weeks   Overall anticipated outcome: supervision/ CGA  Depending on your progress and recovery, your program may change. Your Social Worker will coordinate services and will keep you informed of any changes. Your Social Worker's name and contact numbers are listed  below.  The following services may also be recommended but are not provided by the [facility] will be made to provide these services after discharge if needed.  Arrangements include referral to agencies that provide these services.  Your insurance has been verified to be:  [place] Your primary doctor is:  Einar Pheasant  Pertinent information will be shared with your doctor and your insurance company.  Social Worker:  [place], [place] or ([phone]   Information discussed with and copy given to patient by: Lennart Pall, 08/03/2019, 12:44 PM

## 2019-08-06 ENCOUNTER — Inpatient Hospital Stay (HOSPITAL_COMMUNITY): Payer: BC Managed Care – PPO | Admitting: Occupational Therapy

## 2019-08-06 ENCOUNTER — Inpatient Hospital Stay (HOSPITAL_COMMUNITY): Payer: BC Managed Care – PPO

## 2019-08-06 ENCOUNTER — Inpatient Hospital Stay (HOSPITAL_COMMUNITY): Payer: BC Managed Care – PPO | Admitting: Physical Therapy

## 2019-08-06 LAB — COMPREHENSIVE METABOLIC PANEL
ALT: 19 U/L (ref 0–44)
AST: 14 U/L — ABNORMAL LOW (ref 15–41)
Albumin: 1.9 g/dL — ABNORMAL LOW (ref 3.5–5.0)
Alkaline Phosphatase: 62 U/L (ref 38–126)
Anion gap: 8 (ref 5–15)
BUN: 6 mg/dL (ref 6–20)
CO2: 27 mmol/L (ref 22–32)
Calcium: 8.5 mg/dL — ABNORMAL LOW (ref 8.9–10.3)
Chloride: 103 mmol/L (ref 98–111)
Creatinine, Ser: 0.79 mg/dL (ref 0.61–1.24)
GFR calc Af Amer: >60 mL/min (ref >60)
GFR calc non Af Amer: >60 mL/min (ref >60)
Glucose, Bld: 102 mg/dL — ABNORMAL HIGH (ref 70–99)
Potassium: 3.9 mmol/L (ref 3.5–5.1)
Sodium: 138 mmol/L (ref 135–145)
Total Bilirubin: 0.8 mg/dL (ref 0.3–1.2)
Total Protein: 6.4 g/dL — ABNORMAL LOW (ref 6.5–8.1)

## 2019-08-06 LAB — CBC WITH DIFFERENTIAL/PLATELET
Abs Immature Granulocytes: 0.05 10*3/uL (ref 0.00–0.07)
Basophils Absolute: 0.1 10*3/uL (ref 0.0–0.1)
Basophils Relative: 1 %
Eosinophils Absolute: 0.3 10*3/uL (ref 0.0–0.5)
Eosinophils Relative: 3 %
HCT: 32.7 % — ABNORMAL LOW (ref 39.0–52.0)
Hemoglobin: 10.4 g/dL — ABNORMAL LOW (ref 13.0–17.0)
Immature Granulocytes: 1 %
Lymphocytes Relative: 17 %
Lymphs Abs: 1.5 10*3/uL (ref 0.7–4.0)
MCH: 32 pg (ref 26.0–34.0)
MCHC: 31.8 g/dL (ref 30.0–36.0)
MCV: 100.6 fL — ABNORMAL HIGH (ref 80.0–100.0)
Monocytes Absolute: 0.6 10*3/uL (ref 0.1–1.0)
Monocytes Relative: 6 %
Neutro Abs: 6.4 10*3/uL (ref 1.7–7.7)
Neutrophils Relative %: 72 %
Platelets: 417 10*3/uL — ABNORMAL HIGH (ref 150–400)
RBC: 3.25 MIL/uL — ABNORMAL LOW (ref 4.22–5.81)
RDW: 14 % (ref 11.5–15.5)
WBC: 8.9 10*3/uL (ref 4.0–10.5)
nRBC: 0 % (ref 0.0–0.2)

## 2019-08-06 LAB — SEDIMENTATION RATE: Sed Rate: 128 mm/hr — ABNORMAL HIGH (ref 0–16)

## 2019-08-06 LAB — C-REACTIVE PROTEIN: CRP: 8.8 mg/dL — ABNORMAL HIGH (ref <1.0)

## 2019-08-06 MED ORDER — PREDNISONE 10 MG PO TABS
10.0000 mg | ORAL_TABLET | Freq: Every day | ORAL | Status: DC
  Administered 2019-08-07 – 2019-08-09 (×3): 10 mg via ORAL
  Filled 2019-08-06 (×3): qty 1

## 2019-08-06 MED ORDER — TRAZODONE HCL 50 MG PO TABS
50.0000 mg | ORAL_TABLET | Freq: Every evening | ORAL | Status: DC | PRN
  Administered 2019-08-06 – 2019-08-09 (×4): 50 mg via ORAL
  Filled 2019-08-06 (×4): qty 1

## 2019-08-06 NOTE — Progress Notes (Addendum)
Physical Therapy Session Note  Patient Details  Name: [name] MRN: [id number] Date of Birth: 09/17/1965  Today's Date: 08/06/2019 PT Individual Time: [id number] PT Individual Time Calculation (min): 65 min   Short Term Goals: Week 1:  PT Short Term Goal 1 (Week 1): Patient will perform bed mobility with mod A. PT Short Term Goal 2 (Week 1): Patient will perform sit<>stand and stand pivot transfers with min A. PT Short Term Goal 3 (Week 1): Patient will initiate gait training.  Skilled Therapeutic Interventions/Progress Updates:     Patient in bed upon PT arrival. Patient alert and agreeable to PT session. Patient reported 5-6/10 low back pain with muscle spasms during session, RN made aware. PT provided repositioning, rest breaks, and distraction as pain interventions throughout session.   Therapeutic Activity: Bed Mobility: Patient performed supine to sit with supervision with use of hospital bed functions. Provided verbal cues for performing log rolling to maintain spinal precautions. Donned B tennis shoes with total A for time management with patient seated EOB. Transfers: Patient performed sit to/from stand x3 and stand pivot x3 with min A-CGA using RW. Provided verbal cues for scooting forward, foot placement, pushing up and reaching back with B UEs and bending at his hips with a straight back to maintain spinal precautions and manage back pain.   Gait Training:  Patient ambulated 68 feet using Rw with CGA. Ambulated with decreased step length and height, forward trunk lean, downward head gaze, decreased L stance time and weight shift, and narrow BOS. Provided verbal cues for erect posture, looking ahead, increased BOS, and tactile cues for L weight shift. Patient went up/down a 3" step with B handrails with B LEs x5 for strengthening and pre-stair training. Provided cues for erect posture to maintain spinal precautions. He then went up/down a 6" step with his L LE and was only able  to place his R foot on the step, but unable to step up using B rails while maintaining erect posture.  He went up/down 4-6" steps using B rails with min A leading with L while ascending forwards and R while descending backwards. Provided cues for foot placement and sequencing.   Neuromuscular Re-ed: Patient propelled the w/c with B LEs backwards ~100 feet and forwards ~40 feet for LE strengthening with a reciprocal pattern. Required UE assist with forward propulsion due to hamstring weakness.  Performed B hip flexion in sitting with visual target x5 Performed sit to/from stands x5 and stand pivot x1 with B HHA focused on a straight back and increased use of LEs with transfers. Provided multi-modal cues for pushing up and reaching back with B UEs to reduce twisting at the back, and forward weight shift to stand.   Patient in w/c at end of session with breaks locked, seat belt alarm set, and all needs within reach.    Therapy Documentation Precautions:  Precautions Precautions: Back, Fall Precaution Booklet Issued: Yes (comment) Precaution Comments: pt re-educated on precautions Restrictions Weight Bearing Restrictions: No    Therapy/Group: Individual Therapy  Nalleli Largent L Leyna Vanderkolk PT, DPT  08/06/2019, 4:32 PM

## 2019-08-06 NOTE — Progress Notes (Signed)
[facility] PHYSICAL MEDICINE & REHABILITATION PROGRESS NOTE   Subjective/Complaints:  Pt reports still not sleeping well- somehow Trazodone was on list and was taken off, so didn't take. Will give prn at 50 mg only.   ROS; pt denies SOB, CP, Abd pain, HA, vision changes, N/V/D/Constipation.  Objective:   No results found. Recent Labs    08/06/19 0353  WBC 8.9  HGB 10.4*  HCT 32.7*  PLT 417*   Recent Labs    08/06/19 0353  NA 138  K 3.9  CL 103  CO2 27  GLUCOSE 102*  BUN 6  CREATININE 0.79  CALCIUM 8.5*    Intake/Output Summary (Last 24 hours) at 08/06/2019 0940 Last data filed at 08/06/2019 0700 Gross per 24 hour  Intake 1030 ml  Output 2805 ml  Net -1775 ml     Physical Exam: Vital Signs Blood pressure 119/74, pulse 80, temperature 98.7 F (37.1 C), temperature source Oral, resp. rate 16, SpO2 97 %.  Nursing note and vitals reviewed. Constitutional: He appears well-developed and well-nourished.  Lying in bed; appropriate, nursing changing his IV bandage; NAD HENT:  Head: Normocephalic and atraumatic.  Mouth/Throat: No oropharyngeal exudate.   Eyes: conjugate gaze.  Neck: No tracheal deviation present.  Cardiovascular: RRR  Respiratory: CTA B/L  GI: abd soft, NT, ND, (+)BS hypoactive  Musculoskeletal:     Cervical back: Normal range of motion and neck supple.     Comments: UEs- deltoid, biceps, triceps, WE grip and finger abd tested- strength 5/5 in UEs B/L RLE- HF 2/5, KE 4/5, DF 4/5, PF 4/5, EHL 4/5 LLE- HF 2-/5, KE 2/5, DF 2/5, PF 2/5, EHL 2/5 No swelling and has good ROM of joints in all 4 extremities  Neurological: No cranial nerve deficit.  Patient is alert.  Mood is a bit flat but appropriate.  Oriented x3 and follows commands. Sensation to Light touch intact in all 4 extremities No increased tone in LEs Has absent LEs DTRs B/L  Skin:  Back incision is dressed with original honeycomb dressing- C/D/I No backside ulcers/skin breakdown seen   Psychiatric:  Appropriate, but flat    Assessment/Plan: 1. Functional deficits secondary to Incomplete cauda equina syndrome due to epidural abscess which require 3+ hours per day of interdisciplinary therapy in a comprehensive inpatient rehab setting.  Physiatrist is providing close team supervision and 24 hour management of active medical problems listed below.  Physiatrist and rehab team continue to assess barriers to discharge/monitor patient progress toward functional and medical goals  Care Tool:  Bathing  Bathing activity did not occur: Safety/medical concerns           Bathing assist       Upper Body Dressing/Undressing Upper body dressing Upper body dressing/undressing activity did not occur (including orthotics): Safety/medical concerns What is the patient wearing?: Hospital gown only    Upper body assist Assist Level: Minimal Assistance - Patient > 75%    Lower Body Dressing/Undressing Lower body dressing      What is the patient wearing?: Underwear/pull up, Pants     Lower body assist Assist for lower body dressing: Total Assistance - Patient < 25%     Toileting Toileting    Toileting assist Assist for toileting: Dependent - Patient 0%     Transfers Chair/bed transfer  Transfers assist  Chair/bed transfer activity did not occur: Safety/medical concerns(unable without skilled intervention.)  Chair/bed transfer assist level: Minimal Assistance - Patient > 75% Chair/bed transfer assistive device: Environmental consultant  Locomotion Ambulation   Ambulation assist   Ambulation activity did not occur: Safety/medical concerns(LE weakness and back pain in standing)  Assist level: Minimal Assistance - Patient > 75% Assistive device: Walker-rolling Max distance: 60   Walk 10 feet activity   Assist  Walk 10 feet activity did not occur: Safety/medical concerns(LE weakness and back pain in standing)  Assist level: Minimal Assistance - Patient > 75% Assistive  device: Walker-rolling   Walk 50 feet activity   Assist Walk 50 feet with 2 turns activity did not occur: Safety/medical concerns  Assist level: Minimal Assistance - Patient > 75% Assistive device: Walker-rolling    Walk 150 feet activity   Assist Walk 150 feet activity did not occur: Safety/medical concerns         Walk 10 feet on uneven surface  activity   Assist Walk 10 feet on uneven surfaces activity did not occur: Safety/medical concerns         Wheelchair     Assist   Type of Wheelchair: Manual Wheelchair activity did not occur: Safety/medical concerns(increased back pain with attempted propulsion)  Wheelchair assist level: Supervision/Verbal cueing Max wheelchair distance: 25    Wheelchair 50 feet with 2 turns activity    Assist    Wheelchair 50 feet with 2 turns activity did not occur: Safety/medical concerns(increased back pain with attempted propulsion)   Assist Level: Supervision/Verbal cueing   Wheelchair 150 feet activity     Assist  Wheelchair 150 feet activity did not occur: Safety/medical concerns(increased back pain with attempted propulsion)   Assist Level: Supervision/Verbal cueing   Blood pressure 119/74, pulse 80, temperature 98.7 F (37.1 C), temperature source Oral, resp. rate 16, SpO2 97 %.  Medical Problem List and Plan: 1.  Bilateral lower extremity weakness secondary to incomplete cauda equina syndrome/epidural abscess status post decompressive lumbar laminectomies L3-4 4-5 with evacuation of epidural abscess [id number] complicated by lumbar vertebral infection and R psoas abscess s/p evacuation             -On Prednisone 15 mg daily-   1/11- will wean Prednisone to 10mg  daily  and change Honeycomb dressing. Wean wean to off in next 1 week             -patient may  Shower if cover back incision             -ELOS/Goals: 2-3 weeks; Mod I 2.  Antithrombotics: -DVT/anticoagulation: SCDs.  Check vascular. -will see if  can start Lovenox in setting of new SCI patient.              -antiplatelet therapy: N/A 3. Pain Management: Oxycodone and Flexeril as needed Much improved, no pain at rest  4. Mood: Provide emotional support             -antipsychotic agents: N/A 5. Neuropsych: This patient is capable of making decisions on his own behalf. 6. Skin/Wound Care: Routine skin checks 7. Fluids/Electrolytes/Nutrition: Routine in and outs with follow-up chemistries 8.  ID/MSSA bacteremia complicated by lumbar vertebral infection.  Plan is for 6 weeks of IV nafcillin initiated 07/25/2019- last day 09/04/19 per ID.  9.  Urinary retention due to neurogenic bladder.  Start Flomax 0.4 mg QHS and then wait x 2-3 days, Voiding with Low PVRs no incont   1/11- no cathing appears to be required- will con't to monitor 10. Insomnia- will order Trazodone 25-50 mg QHS prn for sleep.  1/6- didn't take last night- will ask for tonight  1/11- doesn't  take often so will con't low dose trazodone prn-  11. Questionable neurogenic bowel-  Will monitor closely for bowel incontinence and possible "constipation" which could be due to neurogenic bowel.   1/11- LBM Saturday evening-  12. Hypokalemia- was last 3.0-Don't see any evident that it was repleted- will give 1 dose of 40 mEq of Kcl- and recheck in AM   1/6- K+ 3.3- will replete 1 more time with 40 mEq x2 total.- CMP in am   1/11- K+ 3.9 13. Alcohol Abuse- pt counseled- doesn't agree with our assessment/dx      LOS: 6 days A FACE TO FACE EVALUATION WAS PERFORMED  Cashmere Harmes 08/06/2019, 9:40 AM

## 2019-08-06 NOTE — Plan of Care (Signed)
  Problem: Consults Goal: RH SPINAL CORD INJURY PATIENT EDUCATION Description:  See Patient Education module for education specifics.  Outcome: Progressing Goal: Skin Care Protocol Initiated - if Braden Score 18 or less Description: If consults are not indicated, leave blank or document N/A Outcome: Progressing   Problem: SCI BOWEL ELIMINATION Goal: RH STG MANAGE BOWEL WITH ASSISTANCE Description: STG Manage Bowel with mod Assistance. Outcome: Progressing Goal: RH STG SCI MANAGE BOWEL WITH MEDICATION WITH ASSISTANCE Description: STG SCI Manage bowel with medication with mod assistance. Outcome: Progressing   Problem: SCI BLADDER ELIMINATION Goal: RH STG MANAGE BLADDER WITH ASSISTANCE Description: STG Manage Bladder With mod Assistance Outcome: Progressing Goal: RH STG MANAGE BLADDER WITH EQUIPMENT WITH ASSISTANCE Description: STG Manage Bladder With Equipment With max/total Assistance Outcome: Progressing   Problem: RH SKIN INTEGRITY Goal: RH STG SKIN FREE OF INFECTION/BREAKDOWN Description: Patients skin will remain free from further infection or breakdown with mod assist. Outcome: Progressing Goal: RH STG MAINTAIN SKIN INTEGRITY WITH ASSISTANCE Description: STG Maintain Skin Integrity With mod Assistance. Outcome: Progressing Goal: RH STG ABLE TO PERFORM INCISION/WOUND CARE W/ASSISTANCE Description: STG Able To Perform Incision/Wound Care With max/total Assistance. Outcome: Progressing   Problem: RH SAFETY Goal: RH STG ADHERE TO SAFETY PRECAUTIONS W/ASSISTANCE/DEVICE Description: STG Adhere to Safety Precautions With cues Assistance/Device. Outcome: Progressing   Problem: RH PAIN MANAGEMENT Goal: RH STG PAIN MANAGED AT OR BELOW PT'S PAIN GOAL Description: < 4 Outcome: Progressing   Problem: RH KNOWLEDGE DEFICIT SCI Goal: RH STG INCREASE KNOWLEDGE OF SELF CARE AFTER SCI Description: With mod assist. Outcome: Progressing

## 2019-08-06 NOTE — Progress Notes (Signed)
Physical Therapy Session Note  Patient Details  Name: [name] MRN: [id number] Date of Birth: April 25, 1966  Today's Date: 08/06/2019 PT Individual Time:  - 49 min     Short Term Goals: Week 2:     Skilled Therapeutic Interventions/Progress Updates: Patient presents supine in bed and agreeable to PT.  Patient performed log roll sup to sit transfer w/ min A and use of siderail.  Patient able to lower LEs off side of bed much more independently today.  Patient amb multiple trials w/ RW and min A.  Patient required seated rest breaks between trials d/t fatigue and c/o back pain.  Patient performed standing balance activities reaching forward and outside of BOS stacking and unstacking cones.  Patient educated on core stabilization before reaching forward and to side, avoiding twisting at this time.  Patient performed unilateral UE and bilateral simultaneously to increase difficulty.  Patient performed toe taps to 3 3/4" platform 3 x 10-15 w/ cueing for speed and balance.  Patient performed multiple sit to stand transfers w/ min A and verbal cues.  Patient remained in w/c when returned to room w/ nurse tech changing linen and will return to bed when completed.     Therapy Documentation Precautions:  Precautions Precautions: Back, Fall Precaution Booklet Issued: Yes (comment) Precaution Comments: pt re-educated on precautions Restrictions Weight Bearing Restrictions: No General:   Vital Signs: Therapy Vitals Temp: 99.3 F (37.4 C) Temp Source: Oral Pulse Rate: 92 Resp: 16 BP: 111/69 Patient Position (if appropriate): Lying Oxygen Therapy SpO2: 96 % O2 Device: Room Air Pain:5/10 and has received pain meds.         Therapy/Group: Individual Therapy  Ladoris Gene 08/06/2019, 3:22 PM

## 2019-08-06 NOTE — Progress Notes (Signed)
Occupational Therapy Session Note  Patient Details  Name: [name] MRN: [id number] Date of Birth: 07/28/1965  Today's Date: 08/06/2019 OT Individual Time: 1100-1204 OT Individual Time Calculation (min): 64 min    Short Term Goals: Week 1:  OT Short Term Goal 1 (Week 1): patient will complete SB transfers with good clearance of bottom with min A and sit to stand with mod A OT Short Term Goal 2 (Week 1): patient will complete UB dressing/bathing and grooming with set up OT Short Term Goal 3 (Week 1): patient will complete LB bathing / dressing and toileting with mod A using assistive devices OT Short Term Goal 4 (Week 1): patient will complete weight shifts and follow back precautions independently  Skilled Therapeutic Interventions/Progress Updates:    patient in bed, alert and ready for therapy session.  Bed mobility with CGA.  sit to stand and ambulation in room with RW to/from bed, arm chair, shower bench and toilet with CGA & increased time.  Completed bathing seated on shower bench (back dressing covered, IV site covered and remained out of shower to ensure that it does not get wet.  Bathing completed with long handled bath sponge and hand held shower CGA in stance otherwise set up/supervision level.  Completed LB dressing seated edge of bed, min/mod A for pants over feet, CGA to pull over hips in stance.  Dependent for teds and slipper socks.  Oral care and hair care independent.  Completed ambulation on unit with CGA.  He returned to sitting edge of bed at close of session, bed alarm set and call bell / tray table in reach.    Therapy Documentation Precautions:  Precautions Precautions: Back, Fall Precaution Booklet Issued: Yes (comment) Precaution Comments: pt re-educated on precautions Restrictions Weight Bearing Restrictions: No General:   Vital Signs: Therapy Vitals Temp: 99.3 F (37.4 C) Temp Source: Oral Pulse Rate: 92 Resp: 16 BP: 111/69 Patient Position (if  appropriate): Lying Oxygen Therapy SpO2: 96 % O2 Device: Room Air Pain: Pain Assessment Pain Scale: 0-10 Pain Score: 0-No pain   Therapy/Group: Individual Therapy  [name] 08/06/2019, 3:32 PM

## 2019-08-07 ENCOUNTER — Inpatient Hospital Stay (HOSPITAL_COMMUNITY): Payer: BC Managed Care – PPO | Admitting: Occupational Therapy

## 2019-08-07 ENCOUNTER — Inpatient Hospital Stay (HOSPITAL_COMMUNITY): Payer: BC Managed Care – PPO

## 2019-08-07 MED ORDER — NAFCILLIN IV (FOR PTA / DISCHARGE USE ONLY): 12.0000 g | INTRAVENOUS | 0 refills | Status: DC

## 2019-08-07 MED ORDER — PRO-STAT SUGAR FREE PO LIQD
30.0000 mL | Freq: Two times a day (BID) | ORAL | Status: DC
  Administered 2019-08-08 – 2019-08-09 (×3): 30 mL via ORAL
  Filled 2019-08-07 (×6): qty 30

## 2019-08-07 NOTE — Progress Notes (Signed)
Nutrition Follow-up  DOCUMENTATION CODES:   Obesity unspecified  INTERVENTION:   - Please obtain updated weight  - Double protein portions TID with meals, RD ordered via HealthTouch diet software  - Pro-stat 30 ml po BID, each supplement provides 100 kcal and 15 grams of protein  - MVI with minerals daily  - d/c Ensure Enlive due to pt's refusal  NUTRITION DIAGNOSIS:   Increased nutrient needs related to wound healing as evidenced by estimated needs.  Ongoing  GOAL:   Patient will meet greater than or equal to 90% of their needs  Progressing  MONITOR:   PO intake, Supplement acceptance, Labs, Weight trends, Skin  REASON FOR ASSESSMENT:   Malnutrition Screening Tool    ASSESSMENT:   54 year old male with PMH of remote tobacco abuse, right total hip arthroplasty and left total hip arthroplasty. Presented 07/25/19 with progressive weakness to lower extremities and back pain as well as left foot drop with urinary retention. MRI of lumbar spine showed sizable epidural abscess L3-4 4-5 but also extensive amount of abscess in the psoas muscle. Underwent decompressive lumbar laminectomies L3-4 4-5 for evacuation of epidural abscess with partial medial facetectomies and foraminotomies 07/25/19. Underwent CT aspiration of right psoas abscess per interventional radiology 07/25/19 showing MSSA bacteremia. Pt admitted to CIR on 1/05.  Pt refusing >90% of Ensure Enlive supplements per MAR. RD to d/c.  Pt needs admission weight. Per RN edema assessment, pt with non-pitting edema to BLE.  RD will add double protein portions to pt's profile in [employer]Touch diet software. Will also trial Pro-stat po BID.  Meal Completion: 25-100% x last 8 meals (averaging 66%)  Medications reviewed and include: Ensure Enlive BID, MVI with minerals, protonix, Prednisone, senna, thiamine, IV abx  Labs reviewed.  UOP: 1500 ml x 24 hours  Diet Order:   Diet Order            Diet regular Room  service appropriate? Yes; Fluid consistency: Thin  Diet effective now              EDUCATION NEEDS:   Education needs have been addressed  Skin:  Skin Assessment: Skin Integrity Issues: Stage II: medial buttocks, right buttocks Incisions: back Other: laceration right buttocks  Last BM:  08/04/19  Height:   Ht Readings from Last 1 Encounters:  07/24/19 6\' 1"  (1.854 m)    Weight:   Wt Readings from Last 1 Encounters:  07/27/19 109.9 kg    Ideal Body Weight:  83.6 kg  BMI:  31.95 kg/m^2  Estimated Nutritional Needs:   Kcal:  2500-2700  Protein:  120-135 grams  Fluid:  >/= 2.0 L    Gaynell Face, MS, RD, LDN Inpatient Clinical Dietitian Pager: [phone] Weekend/After Hours: [phone]

## 2019-08-07 NOTE — Patient Care Conference (Signed)
Inpatient RehabilitationTeam Conference and Plan of Care Update Date: 08/07/2019   Time: 11:45 AM    Patient Name: [name]      Medical Record Number: [id number]  Date of Birth: 09/29/1965 Sex: Male         Room/Bed: 4M09C/4M09C-01 Payor Info: Payor: [place] / Plan: [employer] COMM PPO / Product Type: *No Product type* /    Admit Date/Time:  07/31/2019  1:51 PM  Primary Diagnosis:  Cauda equina syndrome [facility])  Patient Active Problem List   Diagnosis Date Noted  . Pressure injury of skin 08/02/2019  . Cauda equina syndrome ([place]) 07/31/2019  . Neurogenic bladder 07/31/2019  . Bacteremia due to methicillin susceptible Staphylococcus aureus (MSSA) 07/30/2019  . Lumbar discitis 07/30/2019  . Cerebral septic emboli ([place]) 07/30/2019  . S/P lumbar laminectomy 07/25/2019  . Epidural abscess, L2-L5 07/25/2019  . Polymyalgia rheumatica ([place]) 07/24/2019  . Acute bilateral low back pain without sciatica 07/18/2019  . Abdominal pain 07/18/2019  . Anemia 07/18/2019  . Obesity (BMI 35.0-39.9 without comorbidity) 07/18/2019  . Constipation 07/18/2019  . Bilateral leg pain 07/12/2019  . Bilateral leg edema 04/01/2017  . Elevated blood sugar 04/01/2017  . Avascular necrosis of bone of hip, left ([place]) 10/12/2016  . Alcohol abuse, daily use 10/12/2016  . S/P total hip arthroplasty 10/12/2016    Expected Discharge Date: Expected Discharge Date: 08/10/19  Team Members Present: Physician leading conference: Dr. Courtney Heys Social Worker Present: Lennart Pall, LCSW Nurse Present: Other (comment)(Inka Jamison Oka, LPN) Case Manager: Karene Fry, RN PT Present: Apolinar Junes, PT OT Present: Elisabeth Most, OT SLP Present: Charolett Bumpers, SLP PPS Coordinator present : Gunnar Fusi, Novella Olive, PT     Current Status/Progress Goal Weekly Team Focus  Bowel/Bladder   Continent  Maintain tolieting  QS/PRN assessment Bladder Scan O6 hr for no void, I/O cath >350    Swallow/Nutrition/ Hydration             ADL's   CGA for bathing and toileting, min/mod A for LB dressing, set up for UB dressing, independent for grooming seated position, CGA functional transfers  CS/min A for self care and mobility  adl training, family education and functional mobility   Mobility   Supervision bedmobility with use of rails, min A transfers and gait >60 feet with RW, min-mod A 3 steps with B rails.  CGA overall, min A 3 steps no rails.  Balance, strengthening, LE NMR, functional mobility, pain management, stair and gait training when able, patient/caregiver education.   Communication             Safety/Cognition/ Behavioral Observations            Pain   Surgial Pain -Oxycodone prn Q3 hrs  < 3  qs/prn assessment   Skin   Surgical incision dresg ADB, scanty amount drainage,skin glue closure  No Infection  QS/PRN assess,    Rehab Goals Patient on target to meet rehab goals: Yes *See Care Plan and progress notes for long and short-term goals.     Barriers to Discharge  Current Status/Progress Possible Resolutions Date Resolved   Nursing                  PT  Home environment access/layout;IV antibiotics  3 STE no rails, on IV antibiotics 24 hours              OT  SLP                SW                Discharge Planning/Teaching Needs:  Pt to initially d/c to wife's home (separated) with wife and daughter to provide needed assistance.  Teaching needs TBD   Team Discussion: Legs jumping, fine tremor, voiding well, sleep issues, meds adjusted, on IV abx.  RN Oxy for pain, R PICC, stage 2 on bottom, back incision, last BM 1/10.  OT in shower, drsg covered, min A ADLs, needs help with TEDs, has Dtr, transfers S.  PT min bed, CGA/S gait 120', steps min/CGA, dtr here during therapy, CGA goals, at goal level.   Revisions to Treatment Plan: N/A     Medical Summary Current Status: c/o pain; asking for pain meds; on Nafcillin IV ABX; Stage II  sacrum; put abd pad on back incision; no drainage Weekly Focus/Goal: OT- 3/4 of way into shower due to PICC keeping dry; min assist ADLs; needs A with TEDs; CGA-min assist; Supervison for transfers  Barriers to Discharge: Decreased family/caregiver support;Home enviroment access/layout;Other (comments);IV antibiotics;Wound care;Weight bearing restrictions  Barriers to Discharge Comments: pain issues- taking pain meds regularly; back precautions Possible Resolutions to Barriers: CGA-supervision >120 ft RW; up/down 3 stairs min assist -CGA no rails;- goals CGA-Supervision.   Continued Need for Acute Rehabilitation Level of Care: The patient requires daily medical management by a physician with specialized training in physical medicine and rehabilitation for the following reasons: Direction of a multidisciplinary physical rehabilitation program to maximize functional independence : Yes Medical management of patient stability for increased activity during participation in an intensive rehabilitation regime.: Yes Analysis of laboratory values and/or radiology reports with any subsequent need for medication adjustment and/or medical intervention. : Yes   I attest that I was present, lead the team conference, and concur with the assessment and plan of the team.   Retta Diones 08/07/2019, 9:18 PM   Team conference was held via web/ teleconference due to [place] - 19

## 2019-08-07 NOTE — Progress Notes (Addendum)
Physical Therapy Session Note  Patient Details  Name: [name] MRN: [id number] Date of Birth: 05/27/1966  Today's Date: 08/07/2019 PT Individual Time: [id number] and 1415-1530 PT Individual Time Calculation (min): 43 min and 75 min   Short Term Goals: Week 1:  PT Short Term Goal 1 (Week 1): Patient will perform bed mobility with mod A. PT Short Term Goal 2 (Week 1): Patient will perform sit<>stand and stand pivot transfers with min A. PT Short Term Goal 3 (Week 1): Patient will initiate gait training.  Skilled Therapeutic Interventions/Progress Updates:     Session 1: Patient in bed with his daughter in the room upon PT arrival. Patient alert and agreeable to PT session. Patient's daughter participated in family education throughout session. Patient reported 7-8/10 low back pain during session, RN made aware. PT provided repositioning, rest breaks, and distraction as pain interventions throughout session.   Therapeutic Activity: Bed Mobility: Patient performed supine to/from sit with supervision to sit and min A for LE management to lie down x2 with his daughter providing assistance. Patient required the [facility] elevated and use of bed rail to complete mobility, however, was able to tach back log roll technique as learned in previous sessions.  Transfers: Patient performed sit to/from stand and stand pivot transfers during session with close supervision using the RW with PT and his daughter providing safe guarding. Provided verbal cues for pushing-up and reaching back with B UEs to reduce any twist in his back to maintain spinal precautions and manage pain with mobility. Educated patient's daughter on management of IV line and patient to wear non-skid socks or tennis shoes with transfers and bed mobility. Patient's daughter, Ander Purpura, cleared for bed<>w/c transfers in room, RN made aware.  Gait Training:  Patient ambulated 116 feet using RW with close supervision and assist for IV line  managment. Ambulated with decreased gait speed, decreased step length and height, forward trunk lean, downward head gaze and narrow BOS. Provided verbal cues for erect posture, looking ahead, increased BOS, and increased step height. He went up/down 3-6" steps x2 with CGA using a RW ascending backwards and leading with R and descending forwards leading with L. PT demonstrated sequencing and technique prior to first trial and demonstrated safe guarding technique to patient's daughter on first trial. Patient's daughter provided CGA and assist with RW following PT demonstration with min cues on second trial.   Wheelchair Mobility:  Patient was transported in the w/c with total A throughout session for energy conservation and time management.   Patient sitting EOB with his daughter in the room at end of session with breaks locked and all needs within reach. Discussed d/c planning, home safety and set-up, and supervision with all transfers and gait at d/c, patient and his daughter in agreement.   Session 2: Patient in bed upon PT arrival. Patient alert and agreeable to PT session. Patient reported 8-10/10 low back pain with mobility during session resolved to 6-7/10 at rest, RN made aware. PT provided repositioning, rest breaks, and distraction as pain interventions throughout session.   Therapeutic Activity: Bed Mobility: Patient performed supine to/from sit with supervision with HOB slightly elevated without use of bed rail. Provided verbal cues for using the EOB to assist with log rolling and pushing up to his elbow then his hand to sitting, also provided cues for scooping his L LE with his R and bringing knees to chest to bring LEs into the bed. Transfers: Patient performed sit to/from stand x3, stand pivot  x2, and a simulated car transfer to mid-sized SUV seat height with close supervision using a RW. Provided verbal cues for pushing-up and reaching back with B UEs to reduce any twist in his back to  maintain spinal precautions and manage pain with mobility.   Gait Training:  Patient ambulated 166 feet using RW with close supervision and IV line management. Ambulated as above with increased BOS following NMR, see above.  He ambulated up/down a ramp, over 10 feet of mulch, and up/down a 7" curb to simulate community mobility using a RW with CGA-close supervision for safety/balance and IV line management. Provided cues for use of RW with unlevel surfaces.  Wheelchair Mobility:  Patient propelled wheelchair 136 feet with supervision for UE endurance. Provided verbal cues for increased speed and core activation to maintain erect posture and back support.   Neuromuscular Re-ed: Patient performed gait with visual targets to colored dots set for BOS just outside of patient's hip width to exaggerate increase BOS with gait 6 feet x3 with step-to gait pattern and 6 feet x2 with step-through gait pattern. Patient performed sit to stand x3 and stand pivot x1 without RW with min A-CGA for reduced reliance on UEs with transfers, balance, and increased LE muscle activation with a functional activity. Patient reached outside of BOS with a flat back to grab bean bags and tossed them into a bin ~12 feet away 2x1-2 min in standing for dynamic standing balance. Patient had LOB x1 when reaching, able to self correct with min A grabbing onto his IV pole.  Performed perturbations from all directions in standing x1 min without LOB. Performed anterior and posterior ankle strategies leaning into therapist and using core and LEs to return to upright maintaining hips over shoulders throughout 1x5.  Therapeutic Exercise: Patient performed the following exercises with verbal and tactile cues for proper technique. -B heel slides with min A-progressing to CGA   Patient in bed at end of session with breaks locked, bed alarm set, and all needs within reach. Discussed d/c date, d/c planning, PT goals, and educated on fall  risk/prevention, activation of emergency response in the event of a fall, and general progression and MD clearance to returning to work throughout session.    Therapy Documentation Precautions:  Precautions Precautions: Back, Fall Precaution Booklet Issued: Yes (comment) Precaution Comments: pt re-educated on precautions Restrictions Weight Bearing Restrictions: No    Therapy/Group: Individual Therapy  Sherran Margolis L Elaysia Devargas PT, DPT  08/07/2019, 3:56 PM

## 2019-08-07 NOTE — Progress Notes (Signed)
[place] PHYSICAL MEDICINE & REHABILITATION PROGRESS NOTE   Subjective/Complaints:  Voiding well- legs jumping/having muscle spasms- can't get comfortable- nursing in room to give meds.   ROS; pt denies SOB, CP, Abd pain, HA, vision changes, N/V/D/Constipation.  Objective:   No results found. Recent Labs    08/06/19 0353  WBC 8.9  HGB 10.4*  HCT 32.7*  PLT 417*   Recent Labs    08/06/19 0353  NA 138  K 3.9  CL 103  CO2 27  GLUCOSE 102*  BUN 6  CREATININE 0.79  CALCIUM 8.5*    Intake/Output Summary (Last 24 hours) at 08/07/2019 0931 Last data filed at 08/07/2019 0916 Gross per 24 hour  Intake 971.6 ml  Output 1700 ml  Net -728.4 ml     Physical Exam: Vital Signs Blood pressure 119/77, pulse 79, temperature 98.6 F (37 C), temperature source Oral, resp. rate 18, SpO2 96 %.  Nursing note and vitals reviewed. Constitutional: He appears well-developed and well-nourished.  Lying in bed; appropriate, nursing giving PO meds currently; NAD HENT:  Head: Normocephalic and atraumatic.  Mouth/Throat: No oropharyngeal exudate.   Eyes: conjugate gaze.  Neck: No tracheal deviation present.  Cardiovascular: RRR  Respiratory: CTA B/L  GI: abd soft, NT, ND, (+)BS hypoactive  Musculoskeletal:     Cervical back: Normal range of motion and neck supple. Legs appear to be shivering/delicate spasms seen      Comments: UEs- deltoid, biceps, triceps, WE grip and finger abd tested- strength 5/5 in UEs B/L RLE- HF 2/5, KE 4/5, DF 4/5, PF 4/5, EHL 4/5 LLE- HF 2-/5, KE 2/5, DF 2/5, PF 2/5, EHL 2/5 No swelling and has good ROM of joints in all 4 extremities  Neurological: No cranial nerve deficit.  Patient is alert.  Mood is a bit flat but appropriate.  Oriented x3 and follows commands. Sensation to Light touch intact in all 4 extremities No increased tone in LEs Has absent LEs DTRs B/L  Skin:  Back incision is dressed  No backside ulcers/skin breakdown seen  Psychiatric:   Appropriate, but flat    Assessment/Plan: 1. Functional deficits secondary to Incomplete cauda equina syndrome due to epidural abscess which require 3+ hours per day of interdisciplinary therapy in a comprehensive inpatient rehab setting.  Physiatrist is providing close team supervision and 24 hour management of active medical problems listed below.  Physiatrist and rehab team continue to assess barriers to discharge/monitor patient progress toward functional and medical goals  Care Tool:  Bathing  Bathing activity did not occur: Safety/medical concerns Body parts bathed by patient: Right arm, Left arm, Abdomen, Chest, Front perineal area, Buttocks, Right upper leg, Left upper leg, Right lower leg, Left lower leg, Face         Bathing assist Assist Level: Contact Guard/Touching assist     Upper Body Dressing/Undressing Upper body dressing Upper body dressing/undressing activity did not occur (including orthotics): Safety/medical concerns What is the patient wearing?: Pull over shirt    Upper body assist Assist Level: Set up assist    Lower Body Dressing/Undressing Lower body dressing      What is the patient wearing?: Underwear/pull up, Pants     Lower body assist Assist for lower body dressing: Minimal Assistance - Patient > 75%     Toileting Toileting    Toileting assist Assist for toileting: Contact Guard/Touching assist     Transfers Chair/bed transfer  Transfers assist  Chair/bed transfer activity did not occur: Safety/medical concerns(unable without skilled  intervention.)  Chair/bed transfer assist level: Minimal Assistance - Patient > 75% Chair/bed transfer assistive device: Programmer, multimedia   Ambulation assist   Ambulation activity did not occur: Safety/medical concerns(LE weakness and back pain in standing)  Assist level: Contact Guard/Touching assist Assistive device: Walker-rolling Max distance: 2'   Walk 10 feet  activity   Assist  Walk 10 feet activity did not occur: Safety/medical concerns(LE weakness and back pain in standing)  Assist level: Contact Guard/Touching assist Assistive device: Walker-rolling   Walk 50 feet activity   Assist Walk 50 feet with 2 turns activity did not occur: Safety/medical concerns  Assist level: Contact Guard/Touching assist Assistive device: Walker-rolling    Walk 150 feet activity   Assist Walk 150 feet activity did not occur: Safety/medical concerns         Walk 10 feet on uneven surface  activity   Assist Walk 10 feet on uneven surfaces activity did not occur: Safety/medical concerns         Wheelchair     Assist   Type of Wheelchair: Manual Wheelchair activity did not occur: Safety/medical concerns(increased back pain with attempted propulsion)  Wheelchair assist level: Supervision/Verbal cueing Max wheelchair distance: 25    Wheelchair 50 feet with 2 turns activity    Assist    Wheelchair 50 feet with 2 turns activity did not occur: Safety/medical concerns(increased back pain with attempted propulsion)   Assist Level: Supervision/Verbal cueing   Wheelchair 150 feet activity     Assist  Wheelchair 150 feet activity did not occur: Safety/medical concerns(increased back pain with attempted propulsion)   Assist Level: Supervision/Verbal cueing   Blood pressure 119/77, pulse 79, temperature 98.6 F (37 C), temperature source Oral, resp. rate 18, SpO2 96 %.  Medical Problem List and Plan: 1.  Bilateral lower extremity weakness secondary to incomplete cauda equina syndrome/epidural abscess status post decompressive lumbar laminectomies L3-4 4-5 with evacuation of epidural abscess [id number] complicated by lumbar vertebral infection and R psoas abscess s/p evacuation             -On Prednisone 15 mg daily-   1/11- will wean Prednisone to 10mg  daily  and change Honeycomb dressing. Wean wean to off in next 1 week              -patient may  Shower if cover back incision             -ELOS/Goals: 2-3 weeks; Mod I 2.  Antithrombotics: -DVT/anticoagulation: SCDs.  Check vascular. -will see if can start Lovenox in setting of new SCI patient.              -antiplatelet therapy: N/A 3. Pain Management: Oxycodone and Flexeril as needed Much improved, no pain at rest  4. Mood: Provide emotional support             -antipsychotic agents: N/A 5. Neuropsych: This patient is capable of making decisions on his own behalf. 6. Skin/Wound Care: Routine skin checks 7. Fluids/Electrolytes/Nutrition: Routine in and outs with follow-up chemistries 8.  ID/MSSA bacteremia complicated by lumbar vertebral infection.  Plan is for 6 weeks of IV nafcillin initiated 07/25/2019- last day 09/04/19 per ID.  9.  Urinary retention due to neurogenic bladder.  Start Flomax 0.4 mg QHS and then wait x 2-3 days, Voiding with Low PVRs no incont   1/11- no cathing appears to be required- will con't to monitor 10. Insomnia- will order Trazodone 25-50 mg QHS prn for sleep.  1/6- didn't  take last night- will ask for tonight  1/11- doesn't take often so will con't low dose trazodone prn-  11. Questionable neurogenic bowel-  Will monitor closely for bowel incontinence and possible "constipation" which could be due to neurogenic bowel.   1/11- LBM Saturday evening-  12. Hypokalemia- was last 3.0-Don't see any evident that it was repleted- will give 1 dose of 40 mEq of Kcl- and recheck in AM   1/6- K+ 3.3- will replete 1 more time with 40 mEq x2 total.- CMP in am   1/11- K+ 3.9 13. Alcohol Abuse- pt counseled- doesn't agree with our assessment/dx 14. Muscle spasms- has flexeril prn- says is helpful     LOS: 7 days A FACE TO FACE EVALUATION WAS PERFORMED  Ulla Mckiernan 08/07/2019, 9:31 AM

## 2019-08-07 NOTE — Progress Notes (Signed)
Occupational Therapy Session Note  Patient Details  Name: [name] MRN: [id number] Date of Birth: February 19, 1966  Today's Date: 08/07/2019 OT Individual Time: 0800-0900 OT Individual Time Calculation (min): 60 min    Short Term Goals: Week 1:  OT Short Term Goal 1 (Week 1): patient will complete SB transfers with good clearance of bottom with min A and sit to stand with mod A OT Short Term Goal 2 (Week 1): patient will complete UB dressing/bathing and grooming with set up OT Short Term Goal 3 (Week 1): patient will complete LB bathing / dressing and toileting with mod A using assistive devices OT Short Term Goal 4 (Week 1): patient will complete weight shifts and follow back precautions independently  Skilled Therapeutic Interventions/Progress Updates:    Patient in bed, alert and ready for therapy session.  Supine to side lying to SSP with CS.  Sit to stand and SPT / short distance ambulation in room with RW CS.  Able to complete oral care and grooming tasks at sink with CS in stance.  Reviewed and practiced long handled assistive devices for donning/doffing sock and shoes.  He demonstrates ability to use [facility], sock aide, dressing stick and reacher as designed - will need assistance post discharge with teds stockings, able to donn socks with sock aide and shoes (assist to tie shoes) reviewed propping foot on elevated surface for back safety with tying shoes but unable to reach comfortably at this time.  Good carryover of back precautions with adl tasks.  Completed hamstring stretch and posture / pelvic mobility activities.  Ambulation on unit with RW CS level.  He remained seated in the w/c at close of session, seat belt alarm set and call bell in reach.    Therapy Documentation Precautions:  Precautions Precautions: Back, Fall Precaution Booklet Issued: Yes (comment) Precaution Comments: pt re-educated on precautions Restrictions Weight Bearing Restrictions: No General:   Vital  Signs:   Pain: Pain Assessment Pain Scale: 0-10 Pain Score: 4  Pain Type: Surgical pain;Acute pain Pain Location: Back Pain Orientation: Lower Pain Descriptors / Indicators: Discomfort Pain Intervention(s): Repositioned   Therapy/Group: Individual Therapy  [name] 08/07/2019, 12:10 PM

## 2019-08-08 ENCOUNTER — Inpatient Hospital Stay (HOSPITAL_COMMUNITY): Payer: BC Managed Care – PPO

## 2019-08-08 NOTE — Progress Notes (Signed)
Occupational Therapy Session Note  Patient Details  Name: [name] MRN: [id number] Date of Birth: 02/18/1966  Today's Date: 08/08/2019 OT Individual Time: 1000-1057 OT Individual Time Calculation (min): 57 min    Short Term Goals: Week 1:  OT Short Term Goal 1 (Week 1): patient will complete SB transfers with good clearance of bottom with min A and sit to stand with mod A OT Short Term Goal 2 (Week 1): patient will complete UB dressing/bathing and grooming with set up OT Short Term Goal 3 (Week 1): patient will complete LB bathing / dressing and toileting with mod A using assistive devices OT Short Term Goal 4 (Week 1): patient will complete weight shifts and follow back precautions independently  Skilled Therapeutic Interventions/Progress Updates:    1;1. Pt received in bed insisting on shower. Consulted RN who called provider to "Ok" disconnecting IV. OT applies seal skin wrap and bag to PICC line and shower shield to back incision. Pt ambulates into bathroom with CGA and RW and VC for safety awareness/RW management to transfer onto TTB in shower. tp bathes sit to stand with S and LHSS to wash B feet. Pt dressing with A to don shoes/teds only, set up for shirt and CGA to advance pants past hips. Pt completes functional moibliyt in hallway with theraband on thighs to encourage wider BOS when walking with RW and VC. Exited session with RN hooking up IV in room.   Therapy Documentation Precautions:  Precautions Precautions: Back, Fall Precaution Booklet Issued: Yes (comment) Precaution Comments: pt re-educated on precautions Restrictions Weight Bearing Restrictions: No General:   Vital Signs:   Pain:   ADL: ADL Eating: Set up Where Assessed-Eating: Bed level Grooming: Setup Where Assessed-Grooming: Bed level Lower Body Dressing: Dependent ADL Comments: max A SB transfer w/c to bed due to pain, grooming tasks completed bed level, bathing and UB dressing to be assessed when  patient able to tolerate Vision   Perception    Praxis   Exercises:   Other Treatments:     Therapy/Group: Individual Therapy  Tonny Branch 08/08/2019, 10:58 AM

## 2019-08-08 NOTE — Progress Notes (Signed)
Occupational Therapy Session Note  Patient Details  Name: [name] MRN: [id number] Date of Birth: 1965-09-29  Today's Date: 08/08/2019 OT Individual Time: 1403-1500 OT Individual Time Calculation (min): 57 min    Short Term Goals: Week 1:  OT Short Term Goal 1 (Week 1): patient will complete SB transfers with good clearance of bottom with min A and sit to stand with mod A OT Short Term Goal 2 (Week 1): patient will complete UB dressing/bathing and grooming with set up OT Short Term Goal 3 (Week 1): patient will complete LB bathing / dressing and toileting with mod A using assistive devices OT Short Term Goal 4 (Week 1): patient will complete weight shifts and follow back precautions independently  Skilled Therapeutic Interventions/Progress Updates:    1:1. Pt agreeable to tx with no pain reported. Pt completes EOB donning socks with LE elevated on bed and externally rotated to reach foot in semi supported figure 4 position. Pt able to don B socks and use shoe horn to don B shoes. Pt completes functional mobility to/from tx gym with seated rest break with S and VC for stepping RLE wider and relaxing shoulders. Pt completes seated and standing ball ltoss (tile and compliant wedge) with up to MIN A for standing balance on compliant surface for balance challenge and BUE coordination. Exited session with pt seated EOB, exit alarm on and call light in reach  Therapy Documentation Precautions:  Precautions Precautions: Back, Fall Precaution Booklet Issued: Yes (comment) Precaution Comments: pt re-educated on precautions Restrictions Weight Bearing Restrictions: No General:   Vital Signs:   Pain: Pain Assessment Pain Scale: 0-10 Pain Score: 8  Pain Type: Acute pain Pain Location: Back(thighs) Pain Orientation: Right;Left;Mid Pain Descriptors / Indicators: Aching Pain Intervention(s): Medication (See eMAR) ADL: ADL Eating: Set up Where Assessed-Eating: Bed level Grooming:  Setup Where Assessed-Grooming: Bed level Lower Body Dressing: Dependent ADL Comments: max A SB transfer w/c to bed due to pain, grooming tasks completed bed level, bathing and UB dressing to be assessed when patient able to tolerate Vision   Perception    Praxis   Exercises:   Other Treatments:     Therapy/Group: Individual Therapy  Tonny Branch 08/08/2019, 2:59 PM

## 2019-08-08 NOTE — Progress Notes (Addendum)
Physical Therapy Session Note  Patient Details  Name: [name] MRN: [id number] Date of Birth: 10-03-1965  Today's Date: 08/08/2019 PT Individual Time: 0755-0905 PT Individual Time Calculation (min): 70 min   Short Term Goals: Week 1:  PT Short Term Goal 1 (Week 1): Patient will perform bed mobility with mod A. PT Short Term Goal 2 (Week 1): Patient will perform sit<>stand and stand pivot transfers with min A. PT Short Term Goal 3 (Week 1): Patient will initiate gait training.  Skilled Therapeutic Interventions/Progress Updates:     Patient in bed upon PT arrival. Patient alert and agreeable to PT session. Patient reported 7/10 low back pain during session, RN made aware and provided pain medicine and a muscle relaxer during session. PT provided repositioning, rest breaks, and distraction as pain interventions throughout session. Donned B TED hose with total A at beginning of session.   Therapeutic Activity: Bed Mobility: Patient performed supine to sit with supervision and sit to supine with min A for LE management with HOB slightly elevated to simulated use of several pillows on a flat bed without rails to simulate home set up. Provided verbal cues for moving hips and shoulders together to maintain spinal precautions and bringing knees to chest to lift LEs into side-lying on the bed. Donned/doffed B tennis shoes sitting EOB with max A. Transfers: Patient performed sit to/from stand x5 with supervision using a RW. Provided verbal cues for pushing up and reaching back with B UEs to prevent twisting to maintain spinal precautions and reduce pain with transfers. Patient stood at the sink and brushed his teeth and washed his face with supervision in standing with 0-1 UE support.  Gait Training:  Patient ambulated 160 feet x2 using RW with supervision and IV line managment. Ambulated with decreased gait speed, decreased step length and height, forward trunk lean, downward head gaze, and narrow  BOS, improved BOS following NMR, see below. Provided verbal cues for erect posture, looking ahead, and increased BOS.  Neuromuscular Re-ed: Patient performed the following activities for dynamic standing balance and increased muscle activation and motor control of B LEs with use of a RW. -alternating hip flexion x1 min  -B hip flexion 3x10 with multi-modal cues for increased hip flexion to bar on RW and increased BOS using tile lines on the floor with a mirror in front to reduce looking down to maintain spinal precautions. -B hip abduction 2x10 multi-modal cues for increased hip abduction past legs of the RW and a mirror in front to reduce looking down to maintain spinal precautions and to maintain hip rotation at neutral and increased gluteal activation on standing leg. -Mini squats with peach colored band around thighs for tactile stimulation for maintaining knees over toes x10, mirror placed in front for visual feedback -Sit to/from with peach colored band around thighs for tactile stimulation for maintaining knees over toes x4, mirror placed in front for visual feedback  Therapeutic Exercise: Patient performed the following exercises with verbal and tactile cues for proper technique. -B seated hamstring stretch 3x30 sec with back straight, noted significant tightness on R  Patient in bed at end of session with breaks locked, bed alarm set, and all needs within reach. Patient became emotional during session stating, "my legs just don't do what they are supposed to." PT comforted patient and educated about SCI recovery and progress since evaluation. Patient appreciated discussion and expressed a positive outlook about recovery following.    Therapy Documentation Precautions:  Precautions Precautions: Back,  Fall Precaution Booklet Issued: Yes (comment) Precaution Comments: pt re-educated on precautions Restrictions Weight Bearing Restrictions: No    Therapy/Group: Individual  Therapy  Remona Boom L Ofelia Podolski PT, DPT  08/08/2019, 11:15 AM

## 2019-08-08 NOTE — Discharge Summary (Signed)
Physician Discharge Summary  Patient ID: [name] MRN: [id number] DOB/AGE: 55/12/1965 55 y.o.  Admit date: 07/31/2019 Discharge date: 08/10/2019  Discharge Diagnoses:  Principal Problem:   Cauda equina syndrome ([place]) Active Problems:   S/P lumbar laminectomy   Bacteremia due to methicillin susceptible Staphylococcus aureus (MSSA)   Lumbar discitis   Neurogenic bladder   Pressure injury of skin DVT prophylaxis Insomnia Alcohol use  Discharged Condition: Stable  Significant Diagnostic Studies: DG Lumbar Spine 2-3 Views  Result Date: 07/25/2019 CLINICAL DATA:  Decompression laminectomy L3-L4 for epidural abscess. EXAM: LUMBAR SPINE - 2-3 VIEW COMPARISON:  Lumbar spine MRI yesterday. FINDINGS: Two lateral portable cross-table views of the lumbar spine obtained in the operating room. Image number 1 demonstrates surgical instrument localizing posterior to the L3 spinous process. Image number 2 demonstrates surgical instrument localizing posterior to L4-L5. IMPRESSION: Lateral spot views obtained in the operating room localizing posterior to L3 spinous process and L4-L5. Electronically Signed   By: Keith Rake M.D.   On: 07/25/2019 03:11   MR BRAIN W WO CONTRAST  Result Date: 07/26/2019 CLINICAL DATA:  Bacteremia.  Endocarditis.  L3-4 spinal infection. EXAM: MRI HEAD WITHOUT AND WITH CONTRAST TECHNIQUE: Multiplanar, multiecho pulse sequences of the brain and surrounding structures were obtained without and with intravenous contrast. CONTRAST:  42mL GADAVIST GADOBUTROL 1 MMOL/ML IV SOLN COMPARISON:  None. FINDINGS: Brain: Diffusion imaging shows a 16 mm acute infarction in the peripheral right cerebellum. No hemorrhage or contrast enhancement in this location. Cerebral hemispheres show a punctate acute infarction at the right frontoparietal vertex. No other acute finding. The brainstem and cerebellum are otherwise normal. Cerebral hemispheres elsewhere are normal without evidence of old  infarctions. No mass, hemorrhage, hydrocephalus or extra-axial collection. No abnormal brain or leptomeningeal enhancement. Vascular: Major vessels at the base of the brain show flow. Skull and upper cervical spine: Negative Sinuses/Orbits: Clear/normal Other: None IMPRESSION: 16 mm acute infarction in the peripheral right cerebellum. Punctate acute infarction at the right frontoparietal vertex. Differing vascular territories raise the possibility of embolic disease from the heart or ascending aorta. There is no evidence of hemorrhage or contrast enhancement that would allow me to make the specific diagnosis of septic emboli, but that is obviously the concern given the clinical history. Electronically Signed   By: Nelson Chimes M.D.   On: 07/26/2019 15:38   MR THORACIC SPINE W WO CONTRAST  Result Date: 07/24/2019 CLINICAL DATA:  Initial evaluation for acute low back pain with left leg pain. EXAM: MRI THORACIC AND LUMBAR SPINE WITHOUT AND WITH CONTRAST TECHNIQUE: Multiplanar and multiecho pulse sequences of the thoracic and lumbar spine were obtained without and with intravenous contrast. CONTRAST:  3mL GADAVIST GADOBUTROL 1 MMOL/ML IV SOLN COMPARISON:  Prior radiograph from 07/12/2019. FINDINGS: MRI THORACIC SPINE FINDINGS Alignment: Vertebral bodies normally aligned with preservation of the normal thoracic kyphosis. No listhesis. Vertebrae: Vertebral body height maintained without evidence for acute or chronic fracture. Multiple scattered chronic endplate Schmorl's nodes noted throughout the mid and lower thoracic spine. Underlying bone marrow signal intensity diffusely decreased on T1 weighted imaging, nonspecific, but most commonly related to anemia, smoking, or obesity. Few scattered benign hemangiomata noted within the T1, T6, and T12 vertebral bodies. No other discrete or worrisome osseous lesions. No other abnormal marrow edema or enhancement. No evidence for osteomyelitis discitis or septic arthritis.  Cord: Signal intensity within the thoracic spinal cord is grossly within normal limits on this motion degraded exam. Normal cord caliber morphology. No abnormal  enhancement. No appreciable epidural collection. Paraspinal and other soft tissues: Paraspinous soft tissues within normal limits. Minimal atelectatic changes noted within the posterior left lung. Disc levels: T1-2:  Unremarkable. T2-3: Mild right eccentric disc bulge. No canal or foraminal stenosis. T3-4: Negative interspace. Right greater than left facet hypertrophy. No stenosis. T4-5:  Negative interspace.  Mild facet hypertrophy.  No stenosis. T5-6: Diffuse disc bulge with intervertebral disc space narrowing. Mild facet hypertrophy. No significant spinal stenosis. Foramina remain patent. T6-7:  Unremarkable. T7-8: Shallow right paracentral disc protrusion indents the right ventral thecal sac. Minimal flattening of the ventral right cord without cord signal changes. No significant canal or foraminal stenosis. T8-9: Shallow central disc protrusion indents the ventral thecal sac, slightly eccentric to the right. Minimal flattening of the ventral cord without cord signal changes. No significant spinal stenosis. Foramina remain patent. T9-10: Mild disc bulge.  No stenosis. T10-11: Mild disc bulge. Prominent right-sided endplate osteophytic spurring. Right greater than left facet hypertrophy. No stenosis. T11-12: Mild disc bulge. Moderate bilateral facet hypertrophy. No stenosis. T12-L1:  Unremarkable. MRI LUMBAR SPINE FINDINGS Segmentation: Standard. Lowest well-formed disc space labeled the L5-S1 level. Alignment: Trace retrolisthesis of L3 on L4 and L4 on L5. Alignment otherwise normal preservation of the normal lumbar lordosis. Vertebrae: Vertebral body height maintained without evidence for acute or chronic fracture. Bone marrow signal intensity diffusely decreased on T1 weighted imaging, nonspecific, but most commonly related to anemia, smoking, or  obesity. Few scattered benign hemangiomata noted, most prominent of which position within the L2 and L3 vertebral bodies. No other worrisome osseous lesions. There is abnormal marrow edema and enhancement involving the right greater than left aspect of the L4 vertebral body, concerning for acute osteomyelitis. Extension into the right L4 pedicle with possible involvement of the right L3-4 facet (series 18, image 5). Mild edema and enhancement also noted at the right aspect of the L3 inferior endplate, which could also be involved. There is preserved disc height at the intervening L3-4 interspace without overt evidence for acute discitis. Associated paraspinous edema and enhancement seen involving the adjacent psoas musculature bilaterally, extending from L2-3 inferiorly, worse on the right. Superimposed paraspinous abscesses are seen bilaterally. Collection on the right measures 3.9 x 3.2 cm (series 19, image 26). Few scattered heterogeneous but smaller collection seen involving the left psoas, largest of which seen at the level of L4-5 and measures 2.1 cm (series 19, image 25). Evidence for epidural extension of infection with epidural abscess seen extending from approximately L3-4 through L4-5. Abscess primarily involves the dorsal epidural space at the level of L3-4 (series 19, image 18). Overall, collection measures approximately 1.0 x 0.7 x 6.3 cm in greatest dimensions (AP by transverse by craniocaudad). Secondary mass effect on the thecal sac which is compressed anteriorly at the level of L3-4 (series 19, image 18), into the left at the level of L4-5 (series 19, image 27). There is fairly diffuse severe spinal stenosis extending from L3 through L4-5. Conus medullaris: Extends to the L1 level and appears normal. Paraspinal and other soft tissues: Paraspinal infection with associated bilateral psoas muscle abscesses as above. Additional mild soft tissue edema adjacent to the right L3-4 facet, likely infected.  Disc levels: L1-2: Mild circumferential disc bulge. Mild bilateral facet hypertrophy. No significant stenosis. L2-3: Minimal annular disc bulge. Mild to moderate bilateral facet hypertrophy. No significant canal or foraminal stenosis. L3-4: Findings concerning for osteomyelitis involving the L4 and to a lesser extent L3 vertebral bodies. Suspected discitis involving the  L3-4 interspace, although no overt changes evident by MRI as of yet. Circumferential disc bulge. Moderate bilateral facet hypertrophy with small bilateral joint effusions. Edema enhancement about the right L3-4 facet, suspected to be infected. Dorsal epidural abscess with compression of the thecal sac anteriorly. Resultant severe spinal stenosis. Moderate right L3 foraminal narrowing. L4-5: Diffuse disc bulge with disc desiccation. Changes related osteomyelitis involving the L4 vertebral body. Moderate bilateral facet hypertrophy. Epidural abscess involving the dorsal and right aspect of the spinal canal with compression of the thecal sac to the left. Resultant severe spinal stenosis. Moderate right with mild left L4 foraminal narrowing. L5-S1: Negative interspace. Moderate to severe right worse than left facet hypertrophy. No stenosis. IMPRESSION: 1. Findings consistent with acute osteomyelitis centered about the L3-4 interspace as above. Associated epidural abscess extending from L3-4 through L4-5 with resultant severe diffuse spinal stenosis. 2. Associated paraspinous soft tissue infection with bilateral psoas muscle abscesses, right greater than left. 3. No other evidence for acute or distant infection elsewhere within the thoracic or lumbar spine. 4. Underlying mild to moderate multilevel degenerative spondylosis as above. No other high-grade stenosis or impingement. Critical Value/emergent results were called by telephone at the time of interpretation on 07/24/2019 at 9:22 pm to providerCaroline Rau , who verbally acknowledged these results.  Electronically Signed   By: Jeannine Boga M.D.   On: 07/24/2019 21:23   MR Lumbar Spine W Wo Contrast  Result Date: 07/24/2019 CLINICAL DATA:  Initial evaluation for acute low back pain with left leg pain. EXAM: MRI THORACIC AND LUMBAR SPINE WITHOUT AND WITH CONTRAST TECHNIQUE: Multiplanar and multiecho pulse sequences of the thoracic and lumbar spine were obtained without and with intravenous contrast. CONTRAST:  14mL GADAVIST GADOBUTROL 1 MMOL/ML IV SOLN COMPARISON:  Prior radiograph from 07/12/2019. FINDINGS: MRI THORACIC SPINE FINDINGS Alignment: Vertebral bodies normally aligned with preservation of the normal thoracic kyphosis. No listhesis. Vertebrae: Vertebral body height maintained without evidence for acute or chronic fracture. Multiple scattered chronic endplate Schmorl's nodes noted throughout the mid and lower thoracic spine. Underlying bone marrow signal intensity diffusely decreased on T1 weighted imaging, nonspecific, but most commonly related to anemia, smoking, or obesity. Few scattered benign hemangiomata noted within the T1, T6, and T12 vertebral bodies. No other discrete or worrisome osseous lesions. No other abnormal marrow edema or enhancement. No evidence for osteomyelitis discitis or septic arthritis. Cord: Signal intensity within the thoracic spinal cord is grossly within normal limits on this motion degraded exam. Normal cord caliber morphology. No abnormal enhancement. No appreciable epidural collection. Paraspinal and other soft tissues: Paraspinous soft tissues within normal limits. Minimal atelectatic changes noted within the posterior left lung. Disc levels: T1-2:  Unremarkable. T2-3: Mild right eccentric disc bulge. No canal or foraminal stenosis. T3-4: Negative interspace. Right greater than left facet hypertrophy. No stenosis. T4-5:  Negative interspace.  Mild facet hypertrophy.  No stenosis. T5-6: Diffuse disc bulge with intervertebral disc space narrowing. Mild facet  hypertrophy. No significant spinal stenosis. Foramina remain patent. T6-7:  Unremarkable. T7-8: Shallow right paracentral disc protrusion indents the right ventral thecal sac. Minimal flattening of the ventral right cord without cord signal changes. No significant canal or foraminal stenosis. T8-9: Shallow central disc protrusion indents the ventral thecal sac, slightly eccentric to the right. Minimal flattening of the ventral cord without cord signal changes. No significant spinal stenosis. Foramina remain patent. T9-10: Mild disc bulge.  No stenosis. T10-11: Mild disc bulge. Prominent right-sided endplate osteophytic spurring. Right greater than left facet hypertrophy.  No stenosis. T11-12: Mild disc bulge. Moderate bilateral facet hypertrophy. No stenosis. T12-L1:  Unremarkable. MRI LUMBAR SPINE FINDINGS Segmentation: Standard. Lowest well-formed disc space labeled the L5-S1 level. Alignment: Trace retrolisthesis of L3 on L4 and L4 on L5. Alignment otherwise normal preservation of the normal lumbar lordosis. Vertebrae: Vertebral body height maintained without evidence for acute or chronic fracture. Bone marrow signal intensity diffusely decreased on T1 weighted imaging, nonspecific, but most commonly related to anemia, smoking, or obesity. Few scattered benign hemangiomata noted, most prominent of which position within the L2 and L3 vertebral bodies. No other worrisome osseous lesions. There is abnormal marrow edema and enhancement involving the right greater than left aspect of the L4 vertebral body, concerning for acute osteomyelitis. Extension into the right L4 pedicle with possible involvement of the right L3-4 facet (series 18, image 5). Mild edema and enhancement also noted at the right aspect of the L3 inferior endplate, which could also be involved. There is preserved disc height at the intervening L3-4 interspace without overt evidence for acute discitis. Associated paraspinous edema and enhancement seen  involving the adjacent psoas musculature bilaterally, extending from L2-3 inferiorly, worse on the right. Superimposed paraspinous abscesses are seen bilaterally. Collection on the right measures 3.9 x 3.2 cm (series 19, image 26). Few scattered heterogeneous but smaller collection seen involving the left psoas, largest of which seen at the level of L4-5 and measures 2.1 cm (series 19, image 25). Evidence for epidural extension of infection with epidural abscess seen extending from approximately L3-4 through L4-5. Abscess primarily involves the dorsal epidural space at the level of L3-4 (series 19, image 18). Overall, collection measures approximately 1.0 x 0.7 x 6.3 cm in greatest dimensions (AP by transverse by craniocaudad). Secondary mass effect on the thecal sac which is compressed anteriorly at the level of L3-4 (series 19, image 18), into the left at the level of L4-5 (series 19, image 27). There is fairly diffuse severe spinal stenosis extending from L3 through L4-5. Conus medullaris: Extends to the L1 level and appears normal. Paraspinal and other soft tissues: Paraspinal infection with associated bilateral psoas muscle abscesses as above. Additional mild soft tissue edema adjacent to the right L3-4 facet, likely infected. Disc levels: L1-2: Mild circumferential disc bulge. Mild bilateral facet hypertrophy. No significant stenosis. L2-3: Minimal annular disc bulge. Mild to moderate bilateral facet hypertrophy. No significant canal or foraminal stenosis. L3-4: Findings concerning for osteomyelitis involving the L4 and to a lesser extent L3 vertebral bodies. Suspected discitis involving the L3-4 interspace, although no overt changes evident by MRI as of yet. Circumferential disc bulge. Moderate bilateral facet hypertrophy with small bilateral joint effusions. Edema enhancement about the right L3-4 facet, suspected to be infected. Dorsal epidural abscess with compression of the thecal sac anteriorly. Resultant  severe spinal stenosis. Moderate right L3 foraminal narrowing. L4-5: Diffuse disc bulge with disc desiccation. Changes related osteomyelitis involving the L4 vertebral body. Moderate bilateral facet hypertrophy. Epidural abscess involving the dorsal and right aspect of the spinal canal with compression of the thecal sac to the left. Resultant severe spinal stenosis. Moderate right with mild left L4 foraminal narrowing. L5-S1: Negative interspace. Moderate to severe right worse than left facet hypertrophy. No stenosis. IMPRESSION: 1. Findings consistent with acute osteomyelitis centered about the L3-4 interspace as above. Associated epidural abscess extending from L3-4 through L4-5 with resultant severe diffuse spinal stenosis. 2. Associated paraspinous soft tissue infection with bilateral psoas muscle abscesses, right greater than left. 3. No other evidence for  acute or distant infection elsewhere within the thoracic or lumbar spine. 4. Underlying mild to moderate multilevel degenerative spondylosis as above. No other high-grade stenosis or impingement. Critical Value/emergent results were called by telephone at the time of interpretation on 07/24/2019 at 9:22 pm to providerCaroline Rau , who verbally acknowledged these results. Electronically Signed   By: Jeannine Boga M.D.   On: 07/24/2019 21:23   CT Abdomen Pelvis W Contrast  Result Date: 07/19/2019 CLINICAL DATA:  Severe low back pain. Elevated C reactive protein. Leukocytosis. Elevated sedimentation rate. EXAM: CT ABDOMEN AND PELVIS WITH CONTRAST TECHNIQUE: Multidetector CT imaging of the abdomen and pelvis was performed using the standard protocol following bolus administration of intravenous contrast. CONTRAST:  149mL OMNIPAQUE IOHEXOL 300 MG/ML  SOLN COMPARISON:  CT scan of the pelvis dated 06/05/2007 FINDINGS: Lower chest: Normal. Hepatobiliary: No focal liver abnormality is seen. No gallstones, gallbladder wall thickening, or biliary  dilatation. Pancreas: Unremarkable. No pancreatic ductal dilatation or surrounding inflammatory changes. Spleen: Normal in size without focal abnormality. Adrenals/Urinary Tract: The adrenal glands are normal. Kidneys are normal except for an exophytic 15 mm cyst on the lower pole of the left kidney. No hydronephrosis. Bladder is normal. Stomach/Bowel: Stomach is within normal limits. Appendix appears normal. No evidence of bowel wall thickening, distention, or inflammatory changes. Vascular/Lymphatic: Slight aortic atherosclerosis. No enlarged abdominal or pelvic lymph nodes. Reproductive: Prostate is unremarkable. Other: No abdominal wall hernia or abnormality. No abdominopelvic ascites. Musculoskeletal: No acute or significant osseous findings. Bilateral hip prostheses. IMPRESSION: Benign-appearing abdomen and pelvis. Electronically Signed   By: Lorriane Shire M.D.   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 The antibiotics were administered within an appropriate time frame prior to the initiation of the procedure. ANESTHESIA/SEDATION: Fentanyl 100 mcg IV; Versed 2.0 mg IV Moderate Sedation Time:  10 MINUTES The patient was continuously monitored during the procedure by the interventional radiology nurse under my direct supervision. COMPLICATIONS: None immediate. PROCEDURE: Informed written consent was obtained from the patient after a thorough discussion of the procedural risks, benefits and alternatives. All questions were addressed. Maximal Sterile Barrier Technique was utilized including caps, mask, sterile gowns, sterile gloves, sterile drape, hand hygiene and skin antiseptic. A timeout was performed prior to the initiation of the procedure. Previous imaging  reviewed. Patient position prone. Noncontrast localization CT performed. The right psoas muscle fluid collection was localized and marked for a posterior approach. Under sterile conditions and local anesthesia, a 17 gauge 11.8 cm access was advanced from a posterior paraspinous approach into the fluid collection. Needle position confirmed with CT. Syringe aspiration yielded 30 cc purulent fluid. Sample sent for culture. Needle removed. Postprocedure imaging demonstrates no hemorrhage or hematoma. Patient tolerated the procedure well. IMPRESSION: Successful CT-guided needle aspiration of the right psoas muscle paraspinous abscess. Electronically Signed   By: Jerilynn Mages.  Shick M.D.   On: 07/25/2019 15:09   DG Chest Port 1 View  Result Date: 07/24/2019 CLINICAL DATA:  Sepsis. EXAM: PORTABLE CHEST 1 VIEW COMPARISON:  Chest x-ray dated September 29, 2016. FINDINGS: The heart size and mediastinal contours are within normal limits. Both lungs are clear. The visualized skeletal structures are unremarkable. IMPRESSION: No active disease. Electronically Signed   By: Titus Dubin M.D.   On: 07/24/2019 18:10   ECHOCARDIOGRAM COMPLETE  Result Date:  07/27/2019   ECHOCARDIOGRAM REPORT   Patient Name:   [name] [place] Date of Exam: 07/27/2019 Medical Rec #:  [id number]     Height:       73.0 in Accession #:    [id number]    Weight:       235.0 lb Date of Birth:  12-16-1965     BSA:          2.30 m Patient Age:    33 years      BP:           142/82 mmHg Patient Gender: M             HR:           88 bpm. Exam Location:  Inpatient Procedure: 2D Echo Indications:   Bacteremia 790.7  History:       Patient has no prior history of Echocardiogram examinations.                Abscess.  Sonographer:   Johny Chess Referring      [place] Phys: IMPRESSIONS  1. Left ventricular ejection fraction, by visual estimation, is 55 to 60%. The left ventricle has normal function. There is borderline left ventricular hypertrophy.  2.  Mildly dilated left ventricular internal cavity size.  3. The left ventricle has no regional wall motion abnormalities.  4. Global right ventricle has normal systolic function.The right ventricular size is normal. No increase in right ventricular wall thickness.  5. Left atrial size was normal.  6. Right atrial size was normal.  7. Presence of pericardial fat pad.  8. The mitral valve is grossly normal. Trivial mitral valve regurgitation.  9. The tricuspid valve is grossly normal. 10. The aortic valve is tricuspid. Aortic valve regurgitation is not visualized. 11. The pulmonic valve was grossly normal. Pulmonic valve regurgitation is not visualized. 12. TR signal is inadequate for assessing pulmonary artery systolic pressure. 13. The inferior vena cava is normal in size with greater than 50% respiratory variability, suggesting right atrial pressure of 3 mmHg. 14. No obvious valvular vegetations identified. FINDINGS  Left Ventricle: Left ventricular ejection fraction, by visual estimation, is 55 to 60%. The left ventricle has normal function. The left ventricle has no regional wall motion abnormalities. The left ventricular internal cavity size was mildly dilated left ventricle. There is borderline left ventricular hypertrophy. Left ventricular diastolic parameters were normal. Right Ventricle: The right ventricular size is normal. No increase in right ventricular wall thickness. Global RV systolic function is has normal systolic function. Left Atrium: Left atrial size was normal in size. Right Atrium: Right atrial size was normal in size Pericardium: There is no evidence of pericardial effusion. Presence of pericardial fat pad. Mitral Valve: The mitral valve is grossly normal. Trivial mitral valve regurgitation. Tricuspid Valve: The tricuspid valve is grossly normal. Tricuspid valve regurgitation is trivial. Aortic Valve: The aortic valve is tricuspid. Aortic valve regurgitation is not visualized. Pulmonic Valve:  The pulmonic valve was grossly normal. Pulmonic valve regurgitation is not visualized. Pulmonic regurgitation is not visualized. Aorta: The aortic root is normal in size and structure. Venous: The inferior vena cava is normal in size with greater than 50% respiratory variability, suggesting right atrial pressure of 3 mmHg. IAS/Shunts: No atrial level shunt detected by color flow Doppler.  LEFT VENTRICLE PLAX 2D LVIDd:         6.20 cm  Diastology LVIDs:         4.30 cm  LV e' lateral:   10.90 cm/s LV PW:         1.10 cm  LV E/e' lateral: 6.6 LV IVS:        0.80 cm  LV e' medial:    8.27 cm/s LVOT diam:     2.20 cm  LV E/e' medial:  8.8 LV SV:         111 ml LV SV Index:   46.82 LVOT Area:     3.80 cm  RIGHT VENTRICLE RV S prime:     13.20 cm/s LEFT ATRIUM             Index       RIGHT ATRIUM           Index LA diam:        3.50 cm 1.52 cm/m  RA Area:     15.50 cm LA Vol (A2C):   35.3 ml 15.32 ml/m RA Volume:   40.40 ml  17.53 ml/m LA Vol (A4C):   42.7 ml 18.53 ml/m LA Biplane Vol: 41.5 ml 18.01 ml/m  AORTIC VALVE LVOT Vmax:   94.00 cm/s LVOT Vmean:  55.800 cm/s LVOT VTI:    0.166 m  AORTA Ao Root diam: 3.40 cm MITRAL VALVE MV Area (PHT): 4.68 cm             SHUNTS MV PHT:        46.98 msec           Systemic VTI:  0.17 m MV Decel Time: 162 msec             Systemic Diam: 2.20 cm MV E velocity: 72.40 cm/s 103 cm/s MV A velocity: 70.70 cm/s 70.3 cm/s MV E/A ratio:  1.02       1.5  Rozann Lesches MD Electronically signed by Rozann Lesches MD Signature Date/Time: 07/27/2019/3:45:11 PM    Final    XR HIP UNILAT W OR W/O PELVIS 2-3 VIEWS LEFT  Result Date: 07/12/2019 AP pelvis demonstrates bilateral total hip replacements in good position.  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  No acute changes.  No evidence of polyethylene wear.  There is an area of increased bone density in the area of the pubis that I suspect may be related to the sacrum.  Will order for sacral and lumbar  films  XR Lumbar Spine 2-3 Views  Result Date: 07/12/2019 AP and lateral lumbar spine demonstrate degenerative changes particularly at L4-5 and L5-S1 with facet sclerosis.  No listhesis or scoliosis.  No acute changes  VAS [place] LOWER EXTREMITY VENOUS (DVT)  Result Date: 08/01/2019  Lower Venous Study Indications: Swelling.  Risk Factors: None identified. Comparison Study: No prior studies. Performing Technologist: Oliver Hum RVT  Examination Guidelines: A complete evaluation includes B-mode imaging, spectral Doppler, color Doppler, and power Doppler as needed of all accessible portions of each vessel. Bilateral testing is considered an integral part of a complete examination. Limited examinations for reoccurring indications may be performed as noted.  +---------+---------------+---------+-----------+----------+--------------+ RIGHT    CompressibilityPhasicitySpontaneityPropertiesThrombus Aging +---------+---------------+---------+-----------+----------+--------------+ CFV      Full           Yes      Yes                                 +---------+---------------+---------+-----------+----------+--------------+ SFJ      Full                                                        +---------+---------------+---------+-----------+----------+--------------+  FV Prox  Full                                                        +---------+---------------+---------+-----------+----------+--------------+ FV Mid   Full                                                        +---------+---------------+---------+-----------+----------+--------------+ FV DistalFull                                                        +---------+---------------+---------+-----------+----------+--------------+ PFV      Full                                                        +---------+---------------+---------+-----------+----------+--------------+ POP      Full           Yes       Yes                                 +---------+---------------+---------+-----------+----------+--------------+ PTV      Full                                                        +---------+---------------+---------+-----------+----------+--------------+ PERO     Full                                                        +---------+---------------+---------+-----------+----------+--------------+   +---------+---------------+---------+-----------+----------+--------------+ LEFT     CompressibilityPhasicitySpontaneityPropertiesThrombus Aging +---------+---------------+---------+-----------+----------+--------------+ CFV      Full           Yes      Yes                                 +---------+---------------+---------+-----------+----------+--------------+ SFJ      Full                                                        +---------+---------------+---------+-----------+----------+--------------+ FV Prox  Full                                                        +---------+---------------+---------+-----------+----------+--------------+  FV Mid   Full                                                        +---------+---------------+---------+-----------+----------+--------------+ FV DistalFull                                                        +---------+---------------+---------+-----------+----------+--------------+ PFV      Full                                                        +---------+---------------+---------+-----------+----------+--------------+ POP      Full           Yes      Yes                                 +---------+---------------+---------+-----------+----------+--------------+ PTV      Full                                                        +---------+---------------+---------+-----------+----------+--------------+ PERO     Full                                                         +---------+---------------+---------+-----------+----------+--------------+     Summary: Right: There is no evidence of deep vein thrombosis in the lower extremity. No cystic structure found in the popliteal fossa. Left: There is no evidence of deep vein thrombosis in the lower extremity. No cystic structure found in the popliteal fossa.  *See table(s) above for measurements and observations. Electronically signed by Curt Jews MD on 08/01/2019 at 4:40:35 PM.    Final    [place] EKG SITE RITE  Result Date: 07/30/2019 If Site Rite image not attached, placement could not be confirmed due to current cardiac rhythm.   Labs:  Basic Metabolic Panel: Recent Labs  Lab 08/06/19 0353  NA 138  K 3.9  CL 103  CO2 27  GLUCOSE 102*  BUN 6  CREATININE 0.79  CALCIUM 8.5*    CBC: Recent Labs  Lab 08/06/19 0353  WBC 8.9  NEUTROABS 6.4  HGB 10.4*  HCT 32.7*  MCV 100.6*  PLT 417*    CBG: No results for input(s): GLUCAP in the last 168 hours.  Family history.  Father with Hodgkin's disease.  Mother with COPD.  Negative for diabetes CVA hypertension or hyperlipidemia  Brief HPI:   [name] is a 54 y.o. right-handed male with history of remote tobacco abuse, right total hip arthroplasty.  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  He has a daughter and son in the area.  Presented 07/25/2019 with progressive weakness lower extremities and back pain as well as left foot drop with urinary retention.  MRI lumbar spine showed sizable epidural abscess L3-4 4-5 but also extensive amount of abscess in the psoas muscle.  Admission labs showed urine drug screen negative, urine culture greater than 100,000 staph aureus, sodium 128, potassium 3.4, WBC 19,900, sedimentation rate 114.  Recent CT abdomen pelvis with contrast showed no acute findings.  Underwent decompressive lumbar laminotomies L3-4 4-5 for evacuation of epidural abscess with partial medial foraminotomies  07/25/2019 per Dr. Saintclair Halsted.  Slow Decadron taper.  Underwent CT aspiration of right psoas abscess per interventional radiology 07/25/2019 showing MSSA bacteremia.  Echocardiogram with ejection fraction of 60% no vegetation noted.  Placed on nafcillin per infectious disease for bacteremia.  Hospital course pain management.  Patient was admitted for a comprehensive rehab program   Hospital Course: [name] was admitted to rehab 07/31/2019 for inpatient therapies to consist of PT, ST and OT at least three hours five days a week. Past admission physiatrist, therapy team and rehab RN have worked together to provide customized collaborative inpatient rehab.  Pertaining to patient's cauda equina syndrome epidural abscess he had undergone decompressive lumbar laminectomies L3-4 4-5 evacuation of abscess [id number] complicated by lumbar vertebral infection and right psoas abscess status post evacuation.  Patient being weaned slowly from prednisone.  He would follow-up neurosurgery.  In regards to MSSA bacteremia plan for 6 weeks of nafcillin through 09/04/2019 and follow-up infectious disease.  Pain managed use of oxycodone as well as Flexeril.  Subcutaneous Lovenox for DVT prophylaxis venous Doppler studies negative.  Bouts of urinary retention due to neurogenic bladder placed on Flomax no cathing any longer being required.  Questionable neurogenic bowel bowel program established.   Blood pressures were monitored on TID basis and controlled  He/ is continent of bowel and bladder.  He/ has made gains during rehab stay and is attending therapies  He/ will continue to receive follow up therapies   after discharge  Rehab course: During patient's stay in rehab weekly team conferences were held to monitor patient's progress, set goals and discuss barriers to discharge. At admission, patient required +2 physical assist side-lying to sitting, moderate assist for rolling, moderate assist sit to stand mod assist stand  pivot transfers.  Moderate assistance for ADLs  Physical exam.  Blood pressure 142/66 pulse 72 temperature 92 respirations 18 oxygen saturation 98% room air Constitutional.  Well-developed well-nourished H EENT. Head.  Normocephalic and atraumatic Mouth and throat no oropharyngeal exudate Neck.  Supple nontender no JVD no thyromegaly Eyes.  Pupils round and reactive to light no discharge without nystagmus Cardiovascular regular rate rhythm without any extra sounds or murmur heard Respiratory.  Clear to auscultation bilaterally no wheezes GI.  Soft nontender positive bowel sounds Musculoskeletal.  Cervical back.  Normal range of motion neck and supple.  Upper extremities deltoids biceps triceps wrist extension grip finger abduction tested strength 5 out of 5 bilateral upper extremities Right lower extremity hip flexors 2 out of 5 knee extension 4 out of 5 dorsi flexion 4 out of 5 plantar flexion 4 out of 5 EHL 4 out of 5 Left lower extremity hip flexors 2- out of 5 knee extension 2 out of 5 dorsi flexion 2 out of 5 plantar flexion 2 out of 5 EHL 2 out of 5 Neurological.  Patient alert and oriented.  He/ has had improvement in activity  tolerance, balance, postural control as well as ability to compensate for deficits. He has had improvement in functional use RUE/LUE  and RLE/LLE as well as improvement in awareness.  Working with energy conservation techniques.  Patient perform supine to sit with supervision and sit to supine with minimal assist for lower extremity management with head of bed slightly elevated.  Provided verbal cues for moving hips and shoulders together to maintain spinal precautions and bringing knees to chest to lift lower extremities and to side-lying on the bed.  Don and doff bilateral shoes sitting edge of bed with max assist.  Patient performed sit to stand from stand x5-minute with supervision using rolling walker.  Ambulates 160 feet x 2 rolling walker supervision.  Gather his  belongings for activities day living and homemaking.  Family teaching completed plan discharge to home       Disposition: Discharge to home    Diet: Regular  Special Instructions: No driving smoking or alcohol  Medications at discharge 1.  Tylenol as needed 2.  Dulcolax suppository daily as needed 3.  Flexeril 10 mg p.o. 3 times daily as needed muscle spasms 4.  Multivitamin daily 5.  Nafcillin 12 g daily through 09/04/2019 and stop 6.  Oxycodone 10 mg every 3 hours as needed pain 7.  Protonix 40 mg p.o. daily 8.  Prednisone 5 mg daily x three days and stop Senokot 2 tablets daily hold for loose stools 9.  Flomax 0.4 mg daily 10.  Trazodone 50 mg nightly as needed sleep Discharge Instructions    Ambulatory referral to Physical Medicine Rehab   Complete by: As directed    Moderate complexity follow up 1-2 weeks cauda equina syndrome   Home infusion instructions   Complete by: As directed    Instructions: Flushing of vascular access device: 0.9% NaCl pre/post medication administration and prn patency; Heparin 100 u/ml, 62ml for implanted ports and Heparin 10u/ml, 69ml for all other central venous catheters.      Follow-up Information    Lovorn, Jinny Blossom, MD Follow up.   Specialty: Physical Medicine and Rehabilitation Why: Office to call for appointment Contact information: [id number] [address] [phone]        Kary Kos, MD Follow up.   Specialty: Neurosurgery Why: Call for appointment Contact information: [address] [place] [address] [phone]        Tommy Medal, Lavell Islam, MD Follow up.   Specialty: Infectious Diseases Why: Call for appointment Contact information: [address] [address] [phone]           Signed: Lavon Paganini La Conner 08/10/2019, 5:21 AM

## 2019-08-08 NOTE — Progress Notes (Signed)
[facility] PROGRESS NOTE   Subjective/Complaints:  Pt reports BM this AM; was taught how to do Lovenox yesterday so feels comfortable going home doing that.   ROS; pt denies SOB, CP, Abd pain, HA, vision changes, N/V/D/Constipation.  Objective:   No results found. Recent Labs    08/06/19 0353  WBC 8.9  HGB 10.4*  HCT 32.7*  PLT 417*   Recent Labs    08/06/19 0353  NA 138  K 3.9  CL 103  CO2 27  GLUCOSE 102*  BUN 6  CREATININE 0.79  CALCIUM 8.5*    Intake/Output Summary (Last 24 hours) at 08/08/2019 0846 Last data filed at 08/08/2019 0754 Gross per 24 hour  Intake 971.6 ml  Output 2230 ml  Net -1258.4 ml     Physical Exam: Vital Signs Blood pressure 130/86, pulse 88, temperature 98.8 F (37.1 C), temperature source Oral, resp. rate 18, SpO2 99 %.  Nursing note and vitals reviewed. Constitutional: He appears well-developed and well-nourished.  Lying in bed; appropriate, watching TV; can't remember issue that wanted to discuss; NAD HENT:  Head: Normocephalic and atraumatic.  Mouth/Throat: No oropharyngeal exudate.   Eyes: conjugate gaze.  Neck: No tracheal deviation present.  Cardiovascular: RRR  Respiratory: CTA B/L  GI: abd soft, NT, ND, (+)BS hypoactive  Musculoskeletal:     Cervical back: Normal range of motion and neck supple. Legs appear to be shivering/delicate spasms seen      Comments: UEs- deltoid, biceps, triceps, WE grip and finger abd tested- strength 5/5 in UEs B/L RLE- HF 2/5, KE 4/5, DF 4/5, PF 4/5, EHL 4/5 LLE- HF 2-/5, KE 2/5, DF 2/5, PF 2/5, EHL 2/5 No swelling and has good ROM of joints in all 4 extremities  Neurological: No cranial nerve deficit.  Patient is alert.  Mood is a bit flat but appropriate.  Oriented x3 and follows commands. Sensation to Light touch intact in all 4 extremities No increased tone in LEs Has absent LEs DTRs B/L  Skin:  Back incision is dressed  No backside ulcers/skin  breakdown seen  Psychiatric:  Appropriate, but flat    Assessment/Plan: 1. Functional deficits secondary to Incomplete cauda equina syndrome due to epidural abscess which require 3+ hours per day of interdisciplinary therapy in a comprehensive inpatient rehab setting.  Physiatrist is providing close team supervision and 24 hour management of active medical problems listed below.  Physiatrist and rehab team continue to assess barriers to discharge/monitor patient progress toward functional and medical goals  Care Tool:  Bathing  Bathing activity did not occur: Safety/medical concerns Body parts bathed by patient: Right arm, Left arm, Abdomen, Chest, Front perineal area, Buttocks, Right upper leg, Left upper leg, Right lower leg, Left lower leg, Face         Bathing assist Assist Level: Contact Guard/Touching assist     Upper Body Dressing/Undressing Upper body dressing Upper body dressing/undressing activity did not occur (including orthotics): Safety/medical concerns What is the patient wearing?: Pull over shirt    Upper body assist Assist Level: Set up assist    Lower Body Dressing/Undressing Lower body dressing      What is the patient wearing?: Underwear/pull up, Pants     Lower body assist Assist for lower body dressing: Minimal Assistance - Patient > 75%     Toileting Toileting    Toileting assist Assist for toileting: Contact Guard/Touching assist     Transfers Chair/bed transfer  Transfers assist  Chair/bed transfer  activity did not occur: Safety/medical concerns(unable without skilled intervention.)  Chair/bed transfer assist level: Supervision/Verbal cueing Chair/bed transfer assistive device: Programmer, multimedia   Ambulation assist   Ambulation activity did not occur: Safety/medical concerns(LE weakness and back pain in standing)  Assist level: Supervision/Verbal cueing Assistive device: Walker-rolling Max distance: 166'   Walk  10 feet activity   Assist  Walk 10 feet activity did not occur: Safety/medical concerns(LE weakness and back pain in standing)  Assist level: Supervision/Verbal cueing Assistive device: Walker-rolling   Walk 50 feet activity   Assist Walk 50 feet with 2 turns activity did not occur: Safety/medical concerns  Assist level: Supervision/Verbal cueing Assistive device: Walker-rolling    Walk 150 feet activity   Assist Walk 150 feet activity did not occur: Safety/medical concerns  Assist level: Supervision/Verbal cueing Assistive device: Walker-rolling    Walk 10 feet on uneven surface  activity   Assist Walk 10 feet on uneven surfaces activity did not occur: Safety/medical concerns   Assist level: Contact Guard/Touching assist Assistive device: Aeronautical engineer Will patient use wheelchair at discharge?: No Type of Wheelchair: Manual Wheelchair activity did not occur: Safety/medical concerns(increased back pain with attempted propulsion)  Wheelchair assist level: Supervision/Verbal cueing Max wheelchair distance: 136'    Wheelchair 50 feet with 2 turns activity    Assist    Wheelchair 50 feet with 2 turns activity did not occur: Safety/medical concerns(increased back pain with attempted propulsion)   Assist Level: Supervision/Verbal cueing   Wheelchair 150 feet activity     Assist  Wheelchair 150 feet activity did not occur: Safety/medical concerns(increased back pain with attempted propulsion)   Assist Level: Supervision/Verbal cueing   Blood pressure 130/86, pulse 88, temperature 98.8 F (37.1 C), temperature source Oral, resp. rate 18, SpO2 99 %.  Medical Problem List and Plan: 1.  Bilateral lower extremity weakness secondary to incomplete cauda equina syndrome/epidural abscess status post decompressive lumbar laminectomies L3-4 4-5 with evacuation of epidural abscess [id number] complicated by lumbar vertebral infection  and R psoas abscess s/p evacuation             -On Prednisone 15 mg daily-   1/11- will wean Prednisone to 10mg  daily  and change Honeycomb dressing. Wean wean to off in next 1 week             -patient may  Shower if cover back incision             -ELOS/Goals: 2-3 weeks; Mod I 2.  Antithrombotics: -DVT/anticoagulation: SCDs.  Check vascular. -will see if can start Lovenox in setting of new SCI patient.              -antiplatelet therapy: N/A 3. Pain Management: Oxycodone and Flexeril as needed Much improved, no pain at rest  4. Mood: Provide emotional support             -antipsychotic agents: N/A 5. Neuropsych: This patient is capable of making decisions on his own behalf. 6. Skin/Wound Care: Routine skin checks 7. Fluids/Electrolytes/Nutrition: Routine in and outs with follow-up chemistries 8.  ID/MSSA bacteremia complicated by lumbar vertebral infection.  Plan is for 6 weeks of IV nafcillin initiated 07/25/2019- last day 09/04/19 per ID.  9.  Urinary retention due to neurogenic bladder.  Start Flomax 0.4 mg QHS and then wait x 2-3 days, Voiding with Low PVRs no incont   1/11- no cathing appears to be required- will con't to monitor 10.  Insomnia- will order Trazodone 25-50 mg QHS prn for sleep.  1/6- didn't take last night- will ask for tonight  1/11- doesn't take often so will con't low dose trazodone prn-  11. Questionable neurogenic bowel-  Will monitor closely for bowel incontinence and possible "constipation" which could be due to neurogenic bowel.   1/11- LBM Saturday evening-  12. Hypokalemia- was last 3.0-Don't see any evident that it was repleted- will give 1 dose of 40 mEq of Kcl- and recheck in AM   1/6- K+ 3.3- will replete 1 more time with 40 mEq x2 total.- CMP in am   1/11- K+ 3.9 13. Alcohol Abuse- pt counseled- doesn't agree with our assessment/dx 14. Muscle spasms- has flexeril prn-   1/13- says is helpful     LOS: 8 days A FACE TO FACE EVALUATION WAS  PERFORMED  Arlyss Weathersby 08/08/2019, 8:46 AM

## 2019-08-08 NOTE — Plan of Care (Signed)
  Problem: Consults Goal: RH SPINAL CORD INJURY PATIENT EDUCATION Description:  See Patient Education module for education specifics.  Outcome: Progressing Goal: Skin Care Protocol Initiated - if Braden Score 18 or less Description: If consults are not indicated, leave blank or document N/A Outcome: Progressing   Problem: SCI BOWEL ELIMINATION Goal: RH STG MANAGE BOWEL WITH ASSISTANCE Description: STG Manage Bowel with mod Assistance. Outcome: Progressing Goal: RH STG SCI MANAGE BOWEL WITH MEDICATION WITH ASSISTANCE Description: STG SCI Manage bowel with medication with mod assistance. Outcome: Progressing   Problem: SCI BLADDER ELIMINATION Goal: RH STG MANAGE BLADDER WITH ASSISTANCE Description: STG Manage Bladder With mod Assistance Outcome: Progressing Goal: RH STG MANAGE BLADDER WITH EQUIPMENT WITH ASSISTANCE Description: STG Manage Bladder With Equipment With max/total Assistance Outcome: Progressing   Problem: RH SKIN INTEGRITY Goal: RH STG SKIN FREE OF INFECTION/BREAKDOWN Description: Patients skin will remain free from further infection or breakdown with mod assist. Outcome: Progressing Goal: RH STG MAINTAIN SKIN INTEGRITY WITH ASSISTANCE Description: STG Maintain Skin Integrity With mod Assistance. Outcome: Progressing Goal: RH STG ABLE TO PERFORM INCISION/WOUND CARE W/ASSISTANCE Description: STG Able To Perform Incision/Wound Care With max/total Assistance. Outcome: Progressing   Problem: RH SAFETY Goal: RH STG ADHERE TO SAFETY PRECAUTIONS W/ASSISTANCE/DEVICE Description: STG Adhere to Safety Precautions With cues Assistance/Device. Outcome: Progressing   Problem: RH PAIN MANAGEMENT Goal: RH STG PAIN MANAGED AT OR BELOW PT'S PAIN GOAL Description: < 4 Outcome: Progressing   Problem: RH KNOWLEDGE DEFICIT SCI Goal: RH STG INCREASE KNOWLEDGE OF SELF CARE AFTER SCI Description: With mod assist. Outcome: Progressing

## 2019-08-09 ENCOUNTER — Inpatient Hospital Stay (HOSPITAL_COMMUNITY): Payer: BC Managed Care – PPO | Admitting: Physical Therapy

## 2019-08-09 ENCOUNTER — Inpatient Hospital Stay (HOSPITAL_COMMUNITY): Payer: BC Managed Care – PPO | Admitting: Occupational Therapy

## 2019-08-09 ENCOUNTER — Inpatient Hospital Stay (HOSPITAL_COMMUNITY): Payer: BC Managed Care – PPO

## 2019-08-09 MED ORDER — OXYCODONE HCL 10 MG PO TABS: 10.0000 mg | ORAL_TABLET | ORAL | 0 refills | Status: DC | PRN

## 2019-08-09 MED ORDER — SODIUM CHLORIDE 0.9 % IV SOLN: 12.0000 g | INTRAVENOUS | Status: DC

## 2019-08-09 MED ORDER — PREDNISONE 5 MG PO TABS: 5.0000 mg | ORAL_TABLET | Freq: Every day | ORAL | 0 refills | Status: DC

## 2019-08-09 MED ORDER — TAMSULOSIN HCL 0.4 MG PO CAPS: 0.4000 mg | ORAL_CAPSULE | Freq: Every day | ORAL | 0 refills | Status: DC

## 2019-08-09 MED ORDER — BISACODYL 10 MG RE SUPP: 10.0000 mg | Freq: Every day | RECTAL | 0 refills | Status: DC | PRN

## 2019-08-09 MED ORDER — PREDNISONE 10 MG PO TABS
5.0000 mg | ORAL_TABLET | Freq: Every day | ORAL | Status: DC
  Administered 2019-08-10: 5 mg via ORAL
  Filled 2019-08-09: qty 1

## 2019-08-09 MED ORDER — TRAZODONE HCL 50 MG PO TABS: 50.0000 mg | ORAL_TABLET | Freq: Every evening | ORAL | 0 refills | Status: DC | PRN

## 2019-08-09 MED ORDER — CYCLOBENZAPRINE HCL 10 MG PO TABS: 10.0000 mg | ORAL_TABLET | Freq: Three times a day (TID) | ORAL | 0 refills | Status: DC | PRN

## 2019-08-09 MED ORDER — ADULT MULTIVITAMIN W/MINERALS CH: 1.0000 | ORAL_TABLET | Freq: Every day | ORAL | Status: DC

## 2019-08-09 MED ORDER — ACETAMINOPHEN 325 MG PO TABS: 650.0000 mg | ORAL_TABLET | ORAL | Status: DC | PRN

## 2019-08-09 MED ORDER — POLYETHYLENE GLYCOL 3350 17 G PO PACK: 17.0000 g | PACK | Freq: Every day | ORAL | 0 refills | Status: DC | PRN

## 2019-08-09 MED ORDER — SENNA 8.6 MG PO TABS: 2.0000 | ORAL_TABLET | Freq: Every day | ORAL | 0 refills | Status: DC

## 2019-08-09 MED ORDER — PANTOPRAZOLE SODIUM 40 MG PO TBEC: 40.0000 mg | DELAYED_RELEASE_TABLET | Freq: Every day | ORAL | 0 refills | Status: DC

## 2019-08-09 NOTE — Progress Notes (Signed)
[place] PHYSICAL MEDICINE & REHABILITATION PROGRESS NOTE   Subjective/Complaints:  No issues    ROS; pt denies SOB, CP, Abd pain, HA, vision changes, N/V/D/Constipation.  Objective:   No results found. No results for input(s): WBC, HGB, HCT, PLT in the last 72 hours. No results for input(s): NA, K, CL, CO2, GLUCOSE, BUN, CREATININE, CALCIUM in the last 72 hours.  Intake/Output Summary (Last 24 hours) at 08/09/2019 0859 Last data filed at 08/09/2019 0805 Gross per 24 hour  Intake 1482.6 ml  Output 2150 ml  Net -667.4 ml     Physical Exam: Vital Signs Blood pressure 118/74, pulse 84, temperature 99.6 F (37.6 C), temperature source Oral, resp. rate 16, SpO2 96 %.  Nursing note and vitals reviewed. Constitutional: He appears well-developed and well-nourished.  Lying in bed; appropriate, watching TV; can't remember issue that wanted to discuss again today; eating breakfast as well; NAD HENT:  Head: Normocephalic and atraumatic.  Mouth/Throat: No oropharyngeal exudate.   Eyes: conjugate gaze.  Neck: No tracheal deviation present.  Cardiovascular: RRR  Respiratory: CTA B/L  GI: abd soft, NT, ND, (+)BS hypoactive  Musculoskeletal:     Cervical back: Normal range of motion and neck supple. Legs appear to be shivering/delicate spasms seen      Comments: UEs- deltoid, biceps, triceps, WE grip and finger abd tested- strength 5/5 in UEs B/L RLE- HF 2/5, KE 4/5, DF 4/5, PF 4/5, EHL 4/5 LLE- HF 2-/5, KE 2/5, DF 2/5, PF 2/5, EHL 2/5 No swelling and has good ROM of joints in all 4 extremities  Neurological: No cranial nerve deficit.  Patient is alert.  Mood is a bit flat but appropriate.  Oriented x3 and follows commands. Sensation to Light touch intact in all 4 extremities No increased tone in LEs Has absent LEs DTRs B/L  Skin:  Back incision is dressed C/D/I No backside ulcers/skin breakdown seen  Psychiatric:  Appropriate, but flat    Assessment/Plan: 1. Functional  deficits secondary to Incomplete cauda equina syndrome due to epidural abscess which require 3+ hours per day of interdisciplinary therapy in a comprehensive inpatient rehab setting.  Physiatrist is providing close team supervision and 24 hour management of active medical problems listed below.  Physiatrist and rehab team continue to assess barriers to discharge/monitor patient progress toward functional and medical goals  Care Tool:  Bathing  Bathing activity did not occur: Safety/medical concerns Body parts bathed by patient: Right arm, Left arm, Abdomen, Chest, Front perineal area, Buttocks, Right upper leg, Left upper leg, Right lower leg, Left lower leg, Face         Bathing assist Assist Level: Contact Guard/Touching assist     Upper Body Dressing/Undressing Upper body dressing Upper body dressing/undressing activity did not occur (including orthotics): Safety/medical concerns What is the patient wearing?: Pull over shirt    Upper body assist Assist Level: Set up assist    Lower Body Dressing/Undressing Lower body dressing      What is the patient wearing?: Underwear/pull up, Pants     Lower body assist Assist for lower body dressing: Minimal Assistance - Patient > 75%     Toileting Toileting    Toileting assist Assist for toileting: Contact Guard/Touching assist     Transfers Chair/bed transfer  Transfers assist  Chair/bed transfer activity did not occur: Safety/medical concerns(unable without skilled intervention.)  Chair/bed transfer assist level: Supervision/Verbal cueing Chair/bed transfer assistive device: Programmer, multimedia   Ambulation assist   Ambulation activity did  not occur: Safety/medical concerns(LE weakness and back pain in standing)  Assist level: Supervision/Verbal cueing Assistive device: Walker-rolling Max distance: 160'   Walk 10 feet activity   Assist  Walk 10 feet activity did not occur: Safety/medical  concerns(LE weakness and back pain in standing)  Assist level: Supervision/Verbal cueing Assistive device: Walker-rolling   Walk 50 feet activity   Assist Walk 50 feet with 2 turns activity did not occur: Safety/medical concerns  Assist level: Supervision/Verbal cueing Assistive device: Walker-rolling    Walk 150 feet activity   Assist Walk 150 feet activity did not occur: Safety/medical concerns  Assist level: Supervision/Verbal cueing Assistive device: Walker-rolling    Walk 10 feet on uneven surface  activity   Assist Walk 10 feet on uneven surfaces activity did not occur: Safety/medical concerns   Assist level: Contact Guard/Touching assist Assistive device: Aeronautical engineer Will patient use wheelchair at discharge?: No Type of Wheelchair: Manual Wheelchair activity did not occur: Safety/medical concerns(increased back pain with attempted propulsion)  Wheelchair assist level: Supervision/Verbal cueing Max wheelchair distance: 136'    Wheelchair 50 feet with 2 turns activity    Assist    Wheelchair 50 feet with 2 turns activity did not occur: Safety/medical concerns(increased back pain with attempted propulsion)   Assist Level: Supervision/Verbal cueing   Wheelchair 150 feet activity     Assist  Wheelchair 150 feet activity did not occur: Safety/medical concerns(increased back pain with attempted propulsion)   Assist Level: Supervision/Verbal cueing   Blood pressure 118/74, pulse 84, temperature 99.6 F (37.6 C), temperature source Oral, resp. rate 16, SpO2 96 %.  Medical Problem List and Plan: 1.  Bilateral lower extremity weakness secondary to incomplete cauda equina syndrome/epidural abscess status post decompressive lumbar laminectomies L3-4 4-5 with evacuation of epidural abscess 45/09/8880 complicated by lumbar vertebral infection and R psoas abscess s/p evacuation             -On Prednisone 15 mg daily-   1/11-  will wean Prednisone to 39m daily  and change Honeycomb dressing. Wean wean to off in next 1 week  1/14- will reduce Prednisone to 5 mg daily x 3 days then stop.              -patient may  Shower if cover back incision             -ELOS/Goals: 2-3 weeks; Mod I 2.  Antithrombotics: -DVT/anticoagulation: SCDs.  Check vascular. -will see if can start Lovenox in setting of new SCI patient.              -antiplatelet therapy: N/A 3. Pain Management: Oxycodone and Flexeril as needed Much improved, no pain at rest  4. Mood: Provide emotional support             -antipsychotic agents: N/A 5. Neuropsych: This patient is capable of making decisions on his own behalf. 6. Skin/Wound Care: Routine skin checks 7. Fluids/Electrolytes/Nutrition: Routine in and outs with follow-up chemistries 8.  ID/MSSA bacteremia complicated by lumbar vertebral infection.  Plan is for 6 weeks of IV nafcillin initiated 07/25/2019- last day 09/04/19 per ID.   1/14- CRP 8.8- down from 13 and ESR 128- pretty stable.  9.  Urinary retention due to neurogenic bladder.  Start Flomax 0.4 mg QHS and then wait x 2-3 days, Voiding with Low PVRs no incont   1/11- no cathing appears to be required- will con't to monitor 10. Insomnia- will order Trazodone 25-50 mg  QHS prn for sleep.  1/6- didn't take last night- will ask for tonight  1/11- doesn't take often so will con't low dose trazodone prn-  11. Questionable neurogenic bowel-  Will monitor closely for bowel incontinence and possible "constipation" which could be due to neurogenic bowel.   1/11- LBM Saturday evening-  12. Hypokalemia- was last 3.0-Don't see any evident that it was repleted- will give 1 dose of 40 mEq of Kcl- and recheck in AM   1/6- K+ 3.3- will replete 1 more time with 40 mEq x2 total.- CMP in am   1/11- K+ 3.9  1/14- check labs in AM 13. Alcohol Abuse- pt counseled- doesn't agree with our assessment/dx 14. Muscle spasms- has flexeril prn-   1/13- says is  helpful     LOS: 9 days A FACE TO FACE EVALUATION WAS PERFORMED  Letricia Krinsky 08/09/2019, 8:59 AM

## 2019-08-09 NOTE — Progress Notes (Signed)
Arrived @ bedside for cap and flush per consult. Per RN, medications infusing until 1800 today. Flush and cap not needed at this time.

## 2019-08-09 NOTE — Progress Notes (Signed)
Occupational Therapy Discharge Summary  Patient Details  Name: [name] MRN: [id number] Date of Birth: 09/18/1965  Today's Date: 08/09/2019 OT Individual Time: 3151-7616 OT Individual Time Calculation (min): 57 min    Treatment session today with focus on re-assessment, shower and functional transfers as documented below.  Patient able to complete self care and mobility requiring incidental assist for management of IV pole, set up of DME, and assist for donning TEDS stockings.  He is pleased with his progress and feels ready to discharge to home with family support.  Patient has met 10 of 10 long term goals due to improved activity tolerance, improved balance, postural control and improved coordination.  Patient to discharge at overall Supervision level.  Patient's care partner is independent to provide the necessary physical assistance at discharge.    Reasons goals not met: na   Recommendation:  Patient will benefit from ongoing skilled OT services in home health setting to continue to advance functional skills in the area of BADL, iADL and Reduce care partner burden.  Equipment: No equipment provided Patient owns commode and shower bench Reasons for discharge: treatment goals met  Patient/family agrees with progress made and goals achieved: Yes  OT Discharge Precautions/Restrictions  Precautions Precautions: Back;Fall Precaution Comments: patient with good carryover of back precautions Restrictions Weight Bearing Restrictions: No   Pain Pain Assessment Pain Scale: 0-10 Pain Score: 5  Pain Type: Surgical pain Pain Location: Back Pain Orientation: Lower Pain Descriptors / Indicators: Discomfort Pain Frequency: Intermittent Patients Stated Pain Goal: 3 Pain Intervention(s): Repositioned;Shower ADL ADL Equipment Provided: Other (comment), Long-handled sponge(reviewed use of reacher, sock aide, long shoe horn, dressing stick - he believes that he has these devices at  home will obtain post discharge if necessary) Eating: Independent Where Assessed-Eating: Edge of bed Grooming: Independent Where Assessed-Grooming: Chair Upper Body Bathing: Setup Where Assessed-Upper Body Bathing: Shower Lower Body Bathing: Supervision/safety Where Assessed-Lower Body Bathing: Shower Upper Body Dressing: Setup Where Assessed-Upper Body Dressing: Chair Lower Body Dressing: Minimal assistance Where Assessed-Lower Body Dressing: Chair Toileting: Supervision/safety Where Assessed-Toileting: Glass blower/designer: Close supervision Toilet Transfer Method: Counselling psychologist: Bedside commode, Energy manager: Close supervision Social research officer, government Method: Heritage manager: Radio broadcast assistant, Grab bars ADL Comments: patient with IV running most of day - requires set up to cover back incision and IV site to ensure that they stay dry, set up for shirt due to IV, assistance for TEDS Vision Baseline Vision/History: Wears glasses Wears Glasses: At all times Patient Visual Report: No change from baseline Vision Assessment?: No apparent visual deficits   Cognition Overall Cognitive Status: Within Functional Limits for tasks assessed Arousal/Alertness: Awake/alert Orientation Level: Oriented X4 Sensation Sensation Additional Comments: UB sensation intact   Mobility  Bed Mobility Rolling Right: Independent with assistive device Rolling Left: Independent with assistive device Transfers Sit to Stand: Supervision/Verbal cueing  Ambulation with RW household distances CS and assist to manage IV pole Balance Static Sitting Balance Static Sitting - Level of Assistance: 7: Independent Dynamic Sitting Balance Dynamic Sitting - Level of Assistance: 6: Modified independent (Device/Increase time) Static Standing Balance Static Standing - Level of Assistance: 5: Stand by assistance Extremity/Trunk Assessment RUE  Assessment RUE Assessment: Within Functional Limits LUE Assessment LUE Assessment: Within Functional Limits   Erline Levine A Shyenne Maggard 08/09/2019, 11:58 AM

## 2019-08-09 NOTE — Discharge Instructions (Signed)
Inpatient Rehab Discharge Instructions  [name] Discharge date and time: No discharge date for patient encounter.   Activities/Precautions/ Functional Status: Activity: activity as tolerated Diet: regular diet Wound Care: keep wound clean and dry Functional status:  ___ No restrictions     ___ Walk up steps independently ___ 24/7 supervision/assistance   ___ Walk up steps with assistance ___ Intermittent supervision/assistance  ___ Bathe/dress independently ___ Walk with walker     _x__ Bathe/dress with assistance ___ Walk Independently    ___ Shower independently ___ Walk with assistance    ___ Shower with assistance ___ No alcohol     ___ Return to work/school ________   COMMUNITY REFERRALS UPON DISCHARGE:    Home Health:   PT     OT                   Agency:  [facility] Phone: [phone]                  (Nursing staff managing IV to be provided by [facility] @ [phone])       Special Instructions: No driving smoking or alcohol  Continue nafcillin 12 g daily through 09/04/2019 and stop   My questions have been answered and I understand these instructions. I will adhere to these goals and the provided educational materials after my discharge from the hospital.  Patient/Caregiver Signature _______________________________ Date __________  Clinician Signature _______________________________________ Date __________  Please bring this form and your medication list with you to all your follow-up doctor's appointments.

## 2019-08-09 NOTE — Progress Notes (Signed)
Physical Therapy Discharge Summary  Patient Details  Name: [name] MRN: [id number] Date of Birth: 01/19/1966  Today's Date: 08/09/2019 PT Individual Time: 1455-1540 PT Individual Time Calculation (min): 45 min    Patient has met 12 of 13 long term goals due to improved activity tolerance, improved balance, improved postural control, increased strength, increased range of motion, decreased pain, ability to compensate for deficits and improved coordination.  Patient to discharge at an ambulatory level Supervision.   Patient's care partner is independent to provide the necessary physical assistance at discharge.  Reasons goals not met: Patient unable to propel 150 feet in the w/c due to increased back pain with propulsion. Patient is a functional ambulator and will not be using a w/c at d/c.   Recommendation:  Patient will benefit from ongoing skilled PT services in home health setting to continue to advance safe functional mobility, address ongoing impairments in balance, strength, functional mobility, coordination, activity tolerance, patient/caregiver education, and minimize fall risk.  Equipment: No equipment provided, patient has RW  Reasons for discharge: treatment goals met  Patient/family agrees with progress made and goals achieved: Yes  Skilled Therapeutic Intervention: Patient in bed with daughter, [name], in room upon PT arrival. Patient alert and agreeable to PT session. Patient reported 8/10 low back pain during session, patient reported he was pre-medicated prior to session. PT provided repositioning, rest breaks, and distraction as pain interventions throughout session. Patient's daughter participated in family education for d/c tomorrow throughout session.   Therapeutic Activity: Bed Mobility: Patient performed rolling R/[name] independently and he performed supine to/from sit with min A from his daughter in a flat bed without use of bed rails. Patient's daughter  demonstrated safe body mechanics with assistance and patient performed log roll with min cues for shoulders and hips moving together from his daughter.  Transfers: Patient performed sit to/from stand x5 with supervision. Provided verbal cues for maintaining spinal precautions. He performed a simulated car transfer with low SUV seat height with supervision with his daughter guarding and managing IV line.   Gait Training:  Patient ambulated 155 feet using RW with supervision for safety and IV line management from his daughter. Cued for close guarding with longer distances. Ambulated as described below. He went up/down 3-6" steps using a RW ascending backwards leading with R ad descending forwards leading with [name] with CGA from his daughter. Provided min cues for safe guarding technique. Patient and daughter demonstrated safe technique. Suggested having a second person behind patient for the first time when getting home for safety. Patient and daughter in agreement.   Wheelchair Mobility:  Patient was transported in the w/c with total A throughout session for energy conservation and time management.  Therapeutic Exercise: Patient performed the following exercises with verbal and tactile cues for proper technique. Provided and reviewed HEP handout for exercises performed in previous sessions. -mini squats with band 2x10 3x per day 7 days per week -standing hip abduction 2x10 3x per day 7 days per week -seated hamstring stretch 2x30 sec 3x per day 7 days per week -heel slides 2x10 3x per day 7 days per week  Patient seated EOB with his daughter in the room at end of session with breaks locked and all needs within reach. Educated on fall risk/preventon, supervision with all transfers and gait for safety, and coping strategies throughout recovery during session. Patient and his daughter receptive to all education.    PT Discharge Precautions/Restrictions Precautions Precautions: Back;Fall Precaution  Comments: patient with  good carryover of back precautions Restrictions Weight Bearing Restrictions: No Vital Signs Therapy Vitals Temp: 99.2 F (37.3 C) Pulse Rate: 96 Resp: 18 BP: 119/73 Patient Position (if appropriate): Lying Oxygen Therapy SpO2: 96 % O2 Device: Room Air Pain Pain Assessment Pain Scale: 0-10 Pain Score: 9  Pain Type: Acute pain Pain Location: Back Pain Orientation: Mid;Lower Pain Descriptors / Indicators: Aching Patients Stated Pain Goal: 3 Pain Intervention(s): Medication (See eMAR) Vision/Perception  Perception Perception: Within Functional Limits Praxis Praxis: Intact  Cognition Overall Cognitive Status: Within Functional Limits for tasks assessed Arousal/Alertness: Awake/alert Attention: Focused;Sustained Focused Attention: Appears intact Sustained Attention: Appears intact Memory: Appears intact Problem Solving: Appears intact Safety/Judgment: Appears intact Sensation Sensation Light Touch: Impaired Detail Central sensation comments: Reports intermittent numbness of gluteal and perineium region Coordination Gross Motor Movements are Fluid and Coordinated: No Fine Motor Movements are Fluid and Coordinated: Yes Coordination and Movement Description: Cauda equina syndrom with LE weakness Motor  Motor Motor: Paraplegia Motor - Skilled Clinical Observations: LE weakness and limited by sever back pain  Mobility Bed Mobility Bed Mobility: Rolling Right;Rolling Left;Supine to Sit;Sit to Supine Rolling Right: Independent Rolling Left: Independent Supine to Sit: Minimal Assistance - Patient > 75% Sit to Supine: Minimal Assistance - Patient > 75% Transfers Transfers: Sit to Stand;Stand to Sit;Stand Pivot Transfers Sit to Stand: Supervision/Verbal cueing Stand to Sit: Supervision/Verbal cueing Stand Pivot Transfers: Supervision/Verbal cueing Transfer (Assistive device): Rolling walker Locomotion  Gait Ambulation: Yes Gait Assistance:  Supervision/Verbal cueing Gait Distance (Feet): 150 Feet Assistive device: Rolling walker Gait Gait: Yes Gait Pattern: Decreased step length - left;Decreased step length - right;Step-through pattern;Decreased hip/knee flexion - right;Decreased hip/knee flexion - left;Left foot flat;Right foot flat;Lateral hip instability;Decreased trunk rotation;Narrow base of support;Trunk flexed Gait velocity: decreased Stairs / Additional Locomotion Stairs: Yes Stairs Assistance: Contact Guard/Touching assist Stair Management Technique: With walker Number of Stairs: 3 Height of Stairs: 6 Ramp: Supervision/Verbal cueing Curb: Supervision/Verbal cueing Wheelchair Mobility Wheelchair Mobility: No(Patient is a Engineer, petroleum)  Trunk/Postural Assessment  Cervical Assessment Cervical Assessment: Within Functional Limits Thoracic Assessment Thoracic Assessment: Within Functional Limits Lumbar Assessment Lumbar Assessment: Within Functional Limits Postural Control Postural Control: Deficits on evaluation(delayed/decreased)  Balance Balance Balance Assessed: Yes Static Sitting Balance Static Sitting - Balance Support: No upper extremity supported;Feet supported Static Sitting - Level of Assistance: 7: Independent Dynamic Sitting Balance Dynamic Sitting - Balance Support: No upper extremity supported;Feet supported Dynamic Sitting - Level of Assistance: 7: Independent Static Standing Balance Static Standing - Balance Support: No upper extremity supported;During functional activity Static Standing - Level of Assistance: 5: Stand by assistance Dynamic Standing Balance Dynamic Standing - Balance Support: Right upper extremity supported;Left upper extremity supported;During functional activity Dynamic Standing - Level of Assistance: 5: Stand by assistance Extremity Assessment  RUE Assessment RUE Assessment: Within Functional Limits LUE Assessment LUE Assessment: Within Functional Limits RLE  Assessment RLE Assessment: Exceptions to [facility] Active Range of Motion (AROM) Comments: Limited hip flexion  due to back pain, also very tight hamstrings and heel cords General Strength Comments: Grossly in sitting: hip flexion 3+/5, knee flexion/extension 4+/5, DF/PF 5/5 LLE Assessment LLE Assessment: Exceptions to [facility] Active Range of Motion (AROM) Comments: Limited hip flexion due to back pain, also very tight hamstrings and heel cords General Strength Comments: Grossly in sitting: hip flexion 2+/5, knee extension 3+/5, knee flexion 4+/5, DF/PF 4+/5    [name] Callan Norden PT, DPT  08/09/2019, 4:53 PM

## 2019-08-09 NOTE — Progress Notes (Signed)
Physical Therapy Session Note  Patient Details  Name: [name] MRN: [id number] Date of Birth: [birth year]  Today's Date: 08/09/2019 PT Individual Time: 1100-1200 PT Individual Time Calculation (min): 60 min   Short Term Goals: Week 1:  PT Short Term Goal 1 (Week 1): Patient will perform bed mobility with mod A. PT Short Term Goal 2 (Week 1): Patient will perform sit<>stand and stand pivot transfers with min A. PT Short Term Goal 3 (Week 1): Patient will initiate gait training.  Skilled Therapeutic Interventions/Progress Updates:   Pt received sitting in WC and agreeable to PT. Pt reports pain in Low back 8/10. Sit<>stand form WC with supervision assist. Initiated gait training and mild R knee instability noted 2/2 pain. RN made aware of pt pain and medicaiton provided See MAR.   PT instructed pt in standing therex in parallel bars: reciprocal marches, HS curls, Hip abduction, calf raises, ankle DF. Each completed x 8 BLE with cues for full ROM and improved pelvic stability when standing on RLE. Pt required multiple standing rest breaks due to LPB which was relieved in standing.   Dynamic standing balance/tolerance to perform lateral reach followed by bean bag toss to target; 2 x 10 BUE. Supervision assist throughout with 1 UE support on RW. Min cues for improved lateral weight shift to improve maintenance of back precautions.   WC mobility with supervision assist x 175f with min cues for turning technique through door way. No cues or assist need in hallway.   Pt returned to room and performed ambulatory transfer to bed with RW and supervision assist x 145f Patient left sitting EOB with call bell in reach and all needs met.  .           Therapy Documentation Precautions:  Precautions Precautions: Back, Fall Precaution Booklet Issued: Yes (comment) Precaution Comments: patient with good carryover of back precautions Restrictions Weight Bearing Restrictions: No    Vital  Signs: Therapy Vitals Temp: 99.2 F (37.3 C) Pulse Rate: 96 Resp: 18 BP: 119/73 Patient Position (if appropriate): Lying Oxygen Therapy SpO2: 96 % O2 Device: Room Air Pain: Pain Assessment Pain Scale: 0-10 Pain Score: 9  Pain Type: Acute pain Pain Location: Back Pain Orientation: Mid;Lower Pain Descriptors / Indicators: Aching Patients Stated Pain Goal: 3 Pain Intervention(s): Medication (See eMAR) Mobility: Bed Mobility Rolling Right: Independent with assistive device Rolling Left: Independent with assistive device Transfers Transfers: Stand Pivot Transfers Sit to Stand: Supervision/Verbal cueing Stand Pivot Transfers: Supervision/Verbal cueing Transfer (Assistive device): Rolling walker     Balance: Static Sitting Balance Static Sitting - Level of Assistance: 7: Independent Dynamic Sitting Balance Dynamic Sitting - Level of Assistance: 6: Modified independent (Device/Increase time) Static Standing Balance Static Standing - Level of Assistance: 5: Stand by assistance    Therapy/Group: Individual Therapy  AuLorie Phenix/14/2021, 3:36 PM

## 2019-08-10 DIAGNOSIS — M4646 Discitis, unspecified, lumbar region: Secondary | ICD-10-CM | POA: Diagnosis not present

## 2019-08-10 DIAGNOSIS — R7881 Bacteremia: Secondary | ICD-10-CM | POA: Diagnosis not present

## 2019-08-10 LAB — CBC WITH DIFFERENTIAL/PLATELET
Abs Immature Granulocytes: 0.07 10*3/uL (ref 0.00–0.07)
Basophils Absolute: 0.1 10*3/uL (ref 0.0–0.1)
Basophils Relative: 0 %
Eosinophils Absolute: 0.1 10*3/uL (ref 0.0–0.5)
Eosinophils Relative: 1 %
HCT: 35.4 % — ABNORMAL LOW (ref 39.0–52.0)
Hemoglobin: 11.1 g/dL — ABNORMAL LOW (ref 13.0–17.0)
Immature Granulocytes: 1 %
Lymphocytes Relative: 13 %
Lymphs Abs: 1.5 10*3/uL (ref 0.7–4.0)
MCH: 31.5 pg (ref 26.0–34.0)
MCHC: 31.4 g/dL (ref 30.0–36.0)
MCV: 100.6 fL — ABNORMAL HIGH (ref 80.0–100.0)
Monocytes Absolute: 0.7 10*3/uL (ref 0.1–1.0)
Monocytes Relative: 6 %
Neutro Abs: 9.2 10*3/uL — ABNORMAL HIGH (ref 1.7–7.7)
Neutrophils Relative %: 79 %
Platelets: 438 10*3/uL — ABNORMAL HIGH (ref 150–400)
RBC: 3.52 MIL/uL — ABNORMAL LOW (ref 4.22–5.81)
RDW: 14.2 % (ref 11.5–15.5)
WBC: 11.7 10*3/uL — ABNORMAL HIGH (ref 4.0–10.5)
nRBC: 0 % (ref 0.0–0.2)

## 2019-08-10 LAB — BASIC METABOLIC PANEL
Anion gap: 10 (ref 5–15)
BUN: 8 mg/dL (ref 6–20)
CO2: 24 mmol/L (ref 22–32)
Calcium: 8.8 mg/dL — ABNORMAL LOW (ref 8.9–10.3)
Chloride: 99 mmol/L (ref 98–111)
Creatinine, Ser: 0.89 mg/dL (ref 0.61–1.24)
GFR calc Af Amer: >60 mL/min (ref >60)
GFR calc non Af Amer: >60 mL/min (ref >60)
Glucose, Bld: 105 mg/dL — ABNORMAL HIGH (ref 70–99)
Potassium: 3.8 mmol/L (ref 3.5–5.1)
Sodium: 133 mmol/L — ABNORMAL LOW (ref 135–145)

## 2019-08-10 MED ORDER — HEPARIN SOD (PORK) LOCK FLUSH 100 UNIT/ML IV SOLN
250.0000 [IU] | INTRAVENOUS | Status: DC | PRN
  Filled 2019-08-10: qty 2.5

## 2019-08-10 NOTE — Progress Notes (Signed)
[facility] PHYSICAL MEDICINE & REHABILITATION PROGRESS NOTE   Subjective/Complaints:  No issues- WBC up to 11.7 and Temp is running 99-99.3- pt says he feels great.   ID just determined CAN go home- today- will follow up with ID in next week.   ROS; pt denies SOB, CP, Abd pain, HA, vision changes, N/V/D/Constipation.  Objective:   No results found. Recent Labs    08/10/19 0525  WBC 11.7*  HGB 11.1*  HCT 35.4*  PLT 438*   Recent Labs    08/10/19 0525  NA 133*  K 3.8  CL 99  CO2 24  GLUCOSE 105*  BUN 8  CREATININE 0.89  CALCIUM 8.8*    Intake/Output Summary (Last 24 hours) at 08/10/2019 0854 Last data filed at 08/10/2019 0744 Gross per 24 hour  Intake 472 ml  Output 1975 ml  Net -1503 ml     Physical Exam: Vital Signs Blood pressure 114/69, pulse 96, temperature 99 F (37.2 C), temperature source Oral, resp. rate 18, SpO2 97 %.  Nursing note and vitals reviewed. Constitutional: He appears well-developed and well-nourished.  Lying in bed; appropriate, watching TV; NAD HENT:  Head: Normocephalic and atraumatic.  Mouth/Throat: No oropharyngeal exudate.   Eyes: conjugate gaze.  Neck: No tracheal deviation present.  Cardiovascular: RRR  Respiratory: CTA B/L  GI: abd soft, NT, ND, (+)BS hypoactive  Musculoskeletal:     Cervical back: Normal range of motion and neck supple. Legs appear to be shivering/delicate spasms seen      Comments: UEs- deltoid, biceps, triceps, WE grip and finger abd tested- strength 5/5 in UEs B/L RLE- HF 2/5, KE 4/5, DF 4/5, PF 4/5, EHL 4/5 LLE- HF 2-/5, KE 2/5, DF 2/5, PF 2/5, EHL 2/5 No swelling and has good ROM of joints in all 4 extremities  Neurological: No cranial nerve deficit.  Patient is alert.  Mood is a bit flat but appropriate.  Oriented x3 and follows commands. Sensation to Light touch intact in all 4 extremities No increased tone in LEs Has absent LEs DTRs B/L  Skin:  Back incision is dressed C/D/I No backside  ulcers/skin breakdown seen  Psychiatric:  Appropriate, but flat    Assessment/Plan: 1. Functional deficits secondary to Incomplete cauda equina syndrome due to epidural abscess which require 3+ hours per day of interdisciplinary therapy in a comprehensive inpatient rehab setting.  Physiatrist is providing close team supervision and 24 hour management of active medical problems listed below.  Physiatrist and rehab team continue to assess barriers to discharge/monitor patient progress toward functional and medical goals  Care Tool:  Bathing  Bathing activity did not occur: Safety/medical concerns Body parts bathed by patient: Right arm, Left arm, Abdomen, Chest, Front perineal area, Buttocks, Right upper leg, Left upper leg, Right lower leg, Left lower leg, Face         Bathing assist Assist Level: Supervision/Verbal cueing     Upper Body Dressing/Undressing Upper body dressing Upper body dressing/undressing activity did not occur (including orthotics): Safety/medical concerns What is the patient wearing?: Pull over shirt    Upper body assist Assist Level: Set up assist    Lower Body Dressing/Undressing Lower body dressing      What is the patient wearing?: Underwear/pull up, Pants     Lower body assist Assist for lower body dressing: Set up assist     Toileting Toileting    Toileting assist Assist for toileting: Supervision/Verbal cueing     Transfers Chair/bed transfer  Transfers assist  Chair/bed transfer activity did not occur: Safety/medical concerns(unable without skilled intervention.)  Chair/bed transfer assist level: Supervision/Verbal cueing Chair/bed transfer assistive device: Programmer, multimedia   Ambulation assist   Ambulation activity did not occur: Safety/medical concerns(LE weakness and back pain in standing)  Assist level: Supervision/Verbal cueing Assistive device: Walker-rolling Max distance: 160'   Walk 10 feet  activity   Assist  Walk 10 feet activity did not occur: Safety/medical concerns(LE weakness and back pain in standing)  Assist level: Supervision/Verbal cueing Assistive device: Walker-rolling   Walk 50 feet activity   Assist Walk 50 feet with 2 turns activity did not occur: Safety/medical concerns  Assist level: Supervision/Verbal cueing Assistive device: Walker-rolling    Walk 150 feet activity   Assist Walk 150 feet activity did not occur: Safety/medical concerns  Assist level: Supervision/Verbal cueing Assistive device: Walker-rolling    Walk 10 feet on uneven surface  activity   Assist Walk 10 feet on uneven surfaces activity did not occur: Safety/medical concerns   Assist level: Contact Guard/Touching assist Assistive device: Aeronautical engineer Will patient use wheelchair at discharge?: No Type of Wheelchair: Manual Wheelchair activity did not occur: Safety/medical concerns(increased back pain with attempted propulsion)  Wheelchair assist level: Supervision/Verbal cueing Max wheelchair distance: 136'    Wheelchair 50 feet with 2 turns activity    Assist    Wheelchair 50 feet with 2 turns activity did not occur: Safety/medical concerns(increased back pain with attempted propulsion)   Assist Level: Supervision/Verbal cueing   Wheelchair 150 feet activity     Assist  Wheelchair 150 feet activity did not occur: Safety/medical concerns(increased back pain with attempted propulsion)   Assist Level: Supervision/Verbal cueing   Blood pressure 114/69, pulse 96, temperature 99 F (37.2 C), temperature source Oral, resp. rate 18, SpO2 97 %.  Medical Problem List and Plan: 1.  Bilateral lower extremity weakness secondary to incomplete cauda equina syndrome/epidural abscess status post decompressive lumbar laminectomies L3-4 4-5 with evacuation of epidural abscess 44/96/7591 complicated by lumbar vertebral infection and R psoas  abscess s/p evacuation             -On Prednisone 15 mg daily-   1/11- will wean Prednisone to 11m daily  and change Honeycomb dressing. Wean wean to off in next 1 week  1/14- will reduce Prednisone to 5 mg daily x 3 days then stop.              -patient may  Shower if cover back incision             -ELOS/Goals: 2-3 weeks; Mod I 2.  Antithrombotics: -DVT/anticoagulation: SCDs.  Check vascular. -will see if can start Lovenox in setting of new SCI patient.              -antiplatelet therapy: N/A 3. Pain Management: Oxycodone and Flexeril as needed Much improved, no pain at rest  4. Mood: Provide emotional support             -antipsychotic agents: N/A 5. Neuropsych: This patient is capable of making decisions on his own behalf. 6. Skin/Wound Care: Routine skin checks 7. Fluids/Electrolytes/Nutrition: Routine in and outs with follow-up chemistries 8.  ID/MSSA bacteremia complicated by lumbar vertebral infection.  Plan is for 6 weeks of IV nafcillin initiated 07/25/2019- last day 09/04/19 per ID.   1/14- CRP 8.8- down from 13 and ESR 128- pretty stable.  1/15- WBC 11.7- ID said it's OK that pt goes  home today and follows up with them.   9.  Urinary retention due to neurogenic bladder.  Start Flomax 0.4 mg QHS and then wait x 2-3 days, Voiding with Low PVRs no incont   1/11- no cathing appears to be required- will con't to monitor 10. Insomnia- will order Trazodone 25-50 mg QHS prn for sleep.  1/6- didn't take last night- will ask for tonight  1/11- doesn't take often so will con't low dose trazodone prn-  11. Questionable neurogenic bowel-  Will monitor closely for bowel incontinence and possible "constipation" which could be due to neurogenic bowel.   1/11- LBM Saturday evening-  12. Hypokalemia- was last 3.0-Don't see any evident that it was repleted- will give 1 dose of 40 mEq of Kcl- and recheck in AM   1/6- K+ 3.3- will replete 1 more time with 40 mEq x2 total.- CMP in am   1/11- K+  3.9  1/14- check labs in AM 13. Alcohol Abuse- pt counseled- doesn't agree with our assessment/dx 14. Muscle spasms- has flexeril prn-   1/13- says is helpful     LOS: 10 days A FACE TO FACE EVALUATION WAS PERFORMED  [name] 08/10/2019, 8:54 AM

## 2019-08-10 NOTE — Progress Notes (Signed)
Pt discharged home with family. Discharge instructions given by Linna Hoff, PA. Belongings and equipment sent with pt. IV antibiotic switched to home infusion by [facility] RN. Pain med given prior to DC for comfort. No further questions from family or pt.  Gerald Stabs, RN

## 2019-08-11 DIAGNOSIS — M4646 Discitis, unspecified, lumbar region: Secondary | ICD-10-CM | POA: Diagnosis not present

## 2019-08-11 DIAGNOSIS — R7881 Bacteremia: Secondary | ICD-10-CM | POA: Diagnosis not present

## 2019-08-11 NOTE — Progress Notes (Signed)
Social Work Discharge Note   The overall goal for the admission was met for:   Discharge location: Yes - home with daughter to assist  Length of Stay: Yes - 10 days  Discharge activity level: Yes - supervision overall  Home/community participation: Yes  Services provided included: MD, RD, PT, OT, RN, Pharmacy and [place]: Private Insurance: [employer]  Follow-up services arranged: Home Health: RN via [facility] and PT/ OT via [facility] and Patient/Family has no preference for HH/DME agencies  Comments (or additional information):  Patient/Family verbalized understanding of follow-up arrangements: Yes  Individual responsible for coordination of the follow-up plan: pt  Confirmed correct DME delivered:  NA    Raechelle Sarti

## 2019-08-13 ENCOUNTER — Other Ambulatory Visit (HOSPITAL_COMMUNITY)
Admission: RE | Admit: 2019-08-13 | Discharge: 2019-08-13 | Disposition: A | Discharge status: Home / Self Care | Payer: BC Managed Care – PPO | Source: Other Acute Inpatient Hospital | Attending: Gastroenterology | Admitting: Gastroenterology

## 2019-08-13 ENCOUNTER — Telehealth: Payer: Self-pay | Admitting: *Deleted

## 2019-08-13 DIAGNOSIS — M4646 Discitis, unspecified, lumbar region: Secondary | ICD-10-CM | POA: Diagnosis not present

## 2019-08-13 DIAGNOSIS — B9561 Methicillin susceptible Staphylococcus aureus infection as the cause of diseases classified elsewhere: Secondary | ICD-10-CM | POA: Diagnosis not present

## 2019-08-13 DIAGNOSIS — R7881 Bacteremia: Secondary | ICD-10-CM | POA: Diagnosis not present

## 2019-08-13 LAB — COMPREHENSIVE METABOLIC PANEL
ALT: 20 U/L (ref 0–44)
AST: 15 U/L (ref 15–41)
Albumin: 2.3 g/dL — ABNORMAL LOW (ref 3.5–5.0)
Alkaline Phosphatase: 65 U/L (ref 38–126)
Anion gap: 11 (ref 5–15)
BUN: 8 mg/dL (ref 6–20)
CO2: 26 mmol/L (ref 22–32)
Calcium: 8.5 mg/dL — ABNORMAL LOW (ref 8.9–10.3)
Chloride: 99 mmol/L (ref 98–111)
Creatinine, Ser: 0.72 mg/dL (ref 0.61–1.24)
GFR calc Af Amer: >60 mL/min (ref >60)
GFR calc non Af Amer: >60 mL/min (ref >60)
Glucose, Bld: 117 mg/dL — ABNORMAL HIGH (ref 70–99)
Potassium: 3.6 mmol/L (ref 3.5–5.1)
Sodium: 136 mmol/L (ref 135–145)
Total Bilirubin: 1.2 mg/dL (ref 0.3–1.2)
Total Protein: 6.3 g/dL — ABNORMAL LOW (ref 6.5–8.1)

## 2019-08-13 LAB — CBC WITH DIFFERENTIAL/PLATELET
Abs Immature Granulocytes: 0.05 10*3/uL (ref 0.00–0.07)
Basophils Absolute: 0 10*3/uL (ref 0.0–0.1)
Basophils Relative: 0 %
Eosinophils Absolute: 0.2 10*3/uL (ref 0.0–0.5)
Eosinophils Relative: 2 %
HCT: 30.3 % — ABNORMAL LOW (ref 39.0–52.0)
Hemoglobin: 9.6 g/dL — ABNORMAL LOW (ref 13.0–17.0)
Immature Granulocytes: 1 %
Lymphocytes Relative: 10 %
Lymphs Abs: 1 10*3/uL (ref 0.7–4.0)
MCH: 31.3 pg (ref 26.0–34.0)
MCHC: 31.7 g/dL (ref 30.0–36.0)
MCV: 98.7 fL (ref 80.0–100.0)
Monocytes Absolute: 0.6 10*3/uL (ref 0.1–1.0)
Monocytes Relative: 6 %
Neutro Abs: 7.9 10*3/uL — ABNORMAL HIGH (ref 1.7–7.7)
Neutrophils Relative %: 81 %
Platelets: 360 10*3/uL (ref 150–400)
RBC: 3.07 MIL/uL — ABNORMAL LOW (ref 4.22–5.81)
RDW: 14 % (ref 11.5–15.5)
WBC: 9.8 10*3/uL (ref 4.0–10.5)
nRBC: 0 % (ref 0.0–0.2)

## 2019-08-13 LAB — C-REACTIVE PROTEIN: CRP: 18.3 mg/dL — ABNORMAL HIGH (ref <1.0)

## 2019-08-13 LAB — SEDIMENTATION RATE: Sed Rate: >140 mm/hr — ABNORMAL HIGH (ref 0–16)

## 2019-08-13 NOTE — Telephone Encounter (Signed)
1st attempt - transitional care call  Called patient's number - [phone]  Left voicemail to please contact our office

## 2019-08-14 ENCOUNTER — Telehealth: Payer: Self-pay

## 2019-08-14 DIAGNOSIS — M5144 Schmorl's nodes, thoracic region: Secondary | ICD-10-CM | POA: Diagnosis not present

## 2019-08-14 DIAGNOSIS — M47816 Spondylosis without myelopathy or radiculopathy, lumbar region: Secondary | ICD-10-CM | POA: Diagnosis not present

## 2019-08-14 DIAGNOSIS — R7881 Bacteremia: Secondary | ICD-10-CM | POA: Diagnosis not present

## 2019-08-14 DIAGNOSIS — L89312 Pressure ulcer of right buttock, stage 2: Secondary | ICD-10-CM | POA: Diagnosis not present

## 2019-08-14 DIAGNOSIS — M4316 Spondylolisthesis, lumbar region: Secondary | ICD-10-CM | POA: Diagnosis not present

## 2019-08-14 DIAGNOSIS — G061 Intraspinal abscess and granuloma: Secondary | ICD-10-CM | POA: Diagnosis not present

## 2019-08-14 DIAGNOSIS — T8142XA Infection following a procedure, deep incisional surgical site, initial encounter: Secondary | ICD-10-CM | POA: Diagnosis not present

## 2019-08-14 DIAGNOSIS — L89894 Pressure ulcer of other site, stage 4: Secondary | ICD-10-CM | POA: Diagnosis not present

## 2019-08-14 DIAGNOSIS — K6812 Psoas muscle abscess: Secondary | ICD-10-CM | POA: Diagnosis not present

## 2019-08-14 DIAGNOSIS — M4604 Spinal enthesopathy, thoracic region: Secondary | ICD-10-CM | POA: Diagnosis not present

## 2019-08-14 DIAGNOSIS — M48061 Spinal stenosis, lumbar region without neurogenic claudication: Secondary | ICD-10-CM | POA: Diagnosis not present

## 2019-08-14 DIAGNOSIS — M4804 Spinal stenosis, thoracic region: Secondary | ICD-10-CM | POA: Diagnosis not present

## 2019-08-14 DIAGNOSIS — M4646 Discitis, unspecified, lumbar region: Secondary | ICD-10-CM | POA: Diagnosis not present

## 2019-08-14 DIAGNOSIS — B9561 Methicillin susceptible Staphylococcus aureus infection as the cause of diseases classified elsewhere: Secondary | ICD-10-CM | POA: Diagnosis not present

## 2019-08-14 DIAGNOSIS — G834 Cauda equina syndrome: Secondary | ICD-10-CM | POA: Diagnosis not present

## 2019-08-14 DIAGNOSIS — I081 Rheumatic disorders of both mitral and tricuspid valves: Secondary | ICD-10-CM | POA: Diagnosis not present

## 2019-08-14 NOTE — Telephone Encounter (Signed)
2nd attempt - transitional care call  Called patient's number - [phone]  Left voicemail to please contact our office

## 2019-08-14 NOTE — Telephone Encounter (Signed)
Hardie Lora, PT from [facility] called requesting verbal orders for HHPT 1wk5 and for  RN to go out tomorrow to check patients compression sores. Orders given and approved. Also [facility] has been contacted and will send a RN out this evening or in the morning.

## 2019-08-15 ENCOUNTER — Telehealth: Payer: Self-pay | Admitting: *Deleted

## 2019-08-15 ENCOUNTER — Telehealth: Payer: Self-pay

## 2019-08-15 DIAGNOSIS — G061 Intraspinal abscess and granuloma: Secondary | ICD-10-CM

## 2019-08-15 DIAGNOSIS — M4316 Spondylolisthesis, lumbar region: Secondary | ICD-10-CM | POA: Diagnosis not present

## 2019-08-15 DIAGNOSIS — B9561 Methicillin susceptible Staphylococcus aureus infection as the cause of diseases classified elsewhere: Secondary | ICD-10-CM | POA: Diagnosis not present

## 2019-08-15 DIAGNOSIS — R7881 Bacteremia: Secondary | ICD-10-CM | POA: Diagnosis not present

## 2019-08-15 DIAGNOSIS — L89894 Pressure ulcer of other site, stage 4: Secondary | ICD-10-CM | POA: Diagnosis not present

## 2019-08-15 DIAGNOSIS — L89312 Pressure ulcer of right buttock, stage 2: Secondary | ICD-10-CM | POA: Diagnosis not present

## 2019-08-15 DIAGNOSIS — G834 Cauda equina syndrome: Secondary | ICD-10-CM

## 2019-08-15 DIAGNOSIS — T8142XA Infection following a procedure, deep incisional surgical site, initial encounter: Secondary | ICD-10-CM | POA: Diagnosis not present

## 2019-08-15 DIAGNOSIS — M5144 Schmorl's nodes, thoracic region: Secondary | ICD-10-CM | POA: Diagnosis not present

## 2019-08-15 DIAGNOSIS — M48061 Spinal stenosis, lumbar region without neurogenic claudication: Secondary | ICD-10-CM | POA: Diagnosis not present

## 2019-08-15 DIAGNOSIS — M47816 Spondylosis without myelopathy or radiculopathy, lumbar region: Secondary | ICD-10-CM | POA: Diagnosis not present

## 2019-08-15 DIAGNOSIS — M4646 Discitis, unspecified, lumbar region: Secondary | ICD-10-CM | POA: Diagnosis not present

## 2019-08-15 DIAGNOSIS — K6812 Psoas muscle abscess: Secondary | ICD-10-CM | POA: Diagnosis not present

## 2019-08-15 DIAGNOSIS — M4604 Spinal enthesopathy, thoracic region: Secondary | ICD-10-CM | POA: Diagnosis not present

## 2019-08-15 DIAGNOSIS — I081 Rheumatic disorders of both mitral and tricuspid valves: Secondary | ICD-10-CM | POA: Diagnosis not present

## 2019-08-15 DIAGNOSIS — M4804 Spinal stenosis, thoracic region: Secondary | ICD-10-CM | POA: Diagnosis not present

## 2019-08-15 DIAGNOSIS — L8990 Pressure ulcer of unspecified site, unspecified stage: Secondary | ICD-10-CM

## 2019-08-15 NOTE — Telephone Encounter (Signed)
Patient's daughter Ander Purpura not on [place]) left a voicemail requesting a call back. Tried to call patient's daughter back and unable to leave a message because voicemail has not yet been set up.

## 2019-08-15 NOTE — Telephone Encounter (Signed)
Eliezer Lofts, Rehab Manager, [facility] health left a message stating that there was previous correspondence with [facility] and the HHPT about [facility] assessment for wounds.  It was discussed that [facility] was sending a nurse.  The Rehab manage at [facility] has confirmed that this not going to happen.  She states that [facility] is contracting  [facility] to do the infusion and they are not doing the wound care. According to home therapist wounds are not looking good and is in dire need of assessment. It is feared that he is going to end up back in the hospital.  I contacted the patient, no answer.  I reached out to patient's daughter listed as emergency contact.  She states nobody has been out to assess the wounds. Danella Sensing, ANP was standing at the nurses station and she advised I call Lennart Pall, SW over at the hospital.  I explained the situation to our social worker and she advised that we give the verbal to [facility] for wound assessment.  She stated that she would back up this decision and if the providers voice concern to feel free to call her. [facility] was contacted and a verbal order was given to send out a nurse for wound assessment.  I contacted the daughter and informed. I also informed the daughter that it is imperative that the patient come to his appointment with Dr. Ranell Patrick on 08/22/2019. I told her we need to get her listed on a DPR. She states the patient is in and out of it and really is unable to manage his care.

## 2019-08-15 NOTE — Telephone Encounter (Signed)
Patient to responding to phone messages

## 2019-08-15 NOTE — Telephone Encounter (Signed)
Patient's daughter called back (not on DPR) and she gave the phone to the patient. Advised patient that we need to get his daughter on his DPR. Patient stated that it was okay to give her any information. Advised patient that we will put a DPR form in the mail for him to complete and mail back.. Advised patient once we get this back and put it in his chart we will be able to discuss his health with her when she calls on his behalf. . DPR form put in the mail to the patient.     Patient stated that he needs a wound care nurse to come out to the home. See previous telephone notes.  Patient aware that Dr. Einar Pheasant is out of the office this afternoon.

## 2019-08-15 NOTE — Telephone Encounter (Signed)
Received call from Ander Purpura (daughter of patient) requesting information who to contact in regards to getting a referral for wound care. Advised [name] to contact patient's PCP Dr. Einar Pheasant. [name] agreed she will call and request referral from PCP.  Patient is currently working with [facility] (nursing only for picc care and blood draws) Confirmed with [facility] that [facility] does not provide wound care services.  Eugenia Mcalpine

## 2019-08-16 NOTE — Telephone Encounter (Signed)
See phone note w/Dr. Einar Pheasant.  HH referral from [place] closed.

## 2019-08-16 NOTE — Telephone Encounter (Signed)
The discharge summary indicates follow up with Dr. Dagoberto Ligas.  1st visit is with Dr. Ranell Patrick.

## 2019-08-16 NOTE — Telephone Encounter (Signed)
Appreciate the help closing the loop.

## 2019-08-16 NOTE — Telephone Encounter (Signed)
Called [facility] and spoke with Biochemist, clinical. They opened the patient up for [facility] on Tuesday, 08/14/2019. They already have the order for the RN to go out to the home for Wound Care. The RN will be going out tomorrow and the family should already have  been told this from [place]. They do not have staffing to go out today to see the patient. No other HH order is needed as they are already there.

## 2019-08-16 NOTE — Telephone Encounter (Signed)
Placed HH order. It looks like this patient is also working with PMR to get home health.   Per last note - concern that his wound will result in hospital re-admission.   Given weakness, home health is likely the best option for easiest assessment of wound in person.

## 2019-08-16 NOTE — Telephone Encounter (Signed)
First of all, that delay is unfortunate. I appreciate the follow up described. Secondly, he is not my patient. He's Dr. Florentina Jenny. Should he not be seeing her in follow up or does she not have an office spot available for him next week?

## 2019-08-16 NOTE — Telephone Encounter (Signed)
[name] advised. There is a note from 08/15/19 from [facility] physical med andr ehab office that [facility] was contacted and looks like someone from there suppose to take care of this but [name]-patient's daughter, has not received a call from them so far today. Charmaine, can you check on this please. Patient needs to be seen today if at all possible for his wound. Thank you

## 2019-08-17 ENCOUNTER — Telehealth: Payer: Self-pay

## 2019-08-17 DIAGNOSIS — T8142XA Infection following a procedure, deep incisional surgical site, initial encounter: Secondary | ICD-10-CM | POA: Diagnosis not present

## 2019-08-17 DIAGNOSIS — G834 Cauda equina syndrome: Secondary | ICD-10-CM | POA: Diagnosis not present

## 2019-08-17 DIAGNOSIS — M47816 Spondylosis without myelopathy or radiculopathy, lumbar region: Secondary | ICD-10-CM | POA: Diagnosis not present

## 2019-08-17 DIAGNOSIS — M4804 Spinal stenosis, thoracic region: Secondary | ICD-10-CM | POA: Diagnosis not present

## 2019-08-17 DIAGNOSIS — M5144 Schmorl's nodes, thoracic region: Secondary | ICD-10-CM | POA: Diagnosis not present

## 2019-08-17 DIAGNOSIS — M4604 Spinal enthesopathy, thoracic region: Secondary | ICD-10-CM | POA: Diagnosis not present

## 2019-08-17 DIAGNOSIS — M4646 Discitis, unspecified, lumbar region: Secondary | ICD-10-CM | POA: Diagnosis not present

## 2019-08-17 DIAGNOSIS — L89894 Pressure ulcer of other site, stage 4: Secondary | ICD-10-CM | POA: Diagnosis not present

## 2019-08-17 DIAGNOSIS — R7881 Bacteremia: Secondary | ICD-10-CM | POA: Diagnosis not present

## 2019-08-17 DIAGNOSIS — B9561 Methicillin susceptible Staphylococcus aureus infection as the cause of diseases classified elsewhere: Secondary | ICD-10-CM | POA: Diagnosis not present

## 2019-08-17 DIAGNOSIS — M48061 Spinal stenosis, lumbar region without neurogenic claudication: Secondary | ICD-10-CM | POA: Diagnosis not present

## 2019-08-17 DIAGNOSIS — K6812 Psoas muscle abscess: Secondary | ICD-10-CM | POA: Diagnosis not present

## 2019-08-17 DIAGNOSIS — I081 Rheumatic disorders of both mitral and tricuspid valves: Secondary | ICD-10-CM | POA: Diagnosis not present

## 2019-08-17 DIAGNOSIS — M4316 Spondylolisthesis, lumbar region: Secondary | ICD-10-CM | POA: Diagnosis not present

## 2019-08-17 DIAGNOSIS — L89312 Pressure ulcer of right buttock, stage 2: Secondary | ICD-10-CM | POA: Diagnosis not present

## 2019-08-17 DIAGNOSIS — G061 Intraspinal abscess and granuloma: Secondary | ICD-10-CM | POA: Diagnosis not present

## 2019-08-17 NOTE — Telephone Encounter (Signed)
Again, he is not my patient, and I'm not sure that I've ever seen him before. I will defer to the [facility] at this time. When he is seen in the clinic this coming week, Dr. Ranell Patrick (or [place]) can take a look and make any appropriate changes.

## 2019-08-17 NOTE — Telephone Encounter (Signed)
Diane nurse with [facility] left v/m;Pt has pressure ulcers and Diane will get orders from rehab doctor, Dr Naaman Plummer but needs [facility] nursing orders to go out to see pt. 1 x a wk for 1 wk;  3 x a wk for 2 wks and  2 x a wk for 2 wks. Diane request cb.

## 2019-08-17 NOTE — Telephone Encounter (Signed)
Agree with [facility] nursing orders

## 2019-08-17 NOTE — Telephone Encounter (Signed)
Diane, RN from [facility] called stating she went to see patient today and placed an Aquacel Rope, Petroleum gauze to wound and order sacral pads for wound. She will go out to the home 3 times a week. Do you have any other suggestions to how to treat the wound?

## 2019-08-17 NOTE — Telephone Encounter (Signed)
I contacted Eliezer Lofts, Brewing technologist at [facility] and informed that Dr. Naaman Plummer says, "this is not my patient". I informed that Dr. Dagoberto Ligas will be the signing physician.  I informed that patient will see Dr. Ranell Patrick on 08/22/2019 due to Dr. Florentina Jenny schedule being full. There are two telephone conversations in the patients chart regarding wounds on his buttocks.

## 2019-08-20 ENCOUNTER — Encounter (HOSPITAL_COMMUNITY): Payer: Self-pay | Admitting: Emergency Medicine

## 2019-08-20 ENCOUNTER — Inpatient Hospital Stay (HOSPITAL_COMMUNITY)
Admission: EM | Admit: 2019-08-20 | Discharge: 2019-08-28 | DRG: 871 | Disposition: A | Discharge status: Home Health | Payer: BC Managed Care – PPO | Attending: Family Medicine | Admitting: Family Medicine

## 2019-08-20 ENCOUNTER — Emergency Department (HOSPITAL_COMMUNITY): Payer: BC Managed Care – PPO

## 2019-08-20 ENCOUNTER — Telehealth: Payer: Self-pay | Admitting: *Deleted

## 2019-08-20 ENCOUNTER — Other Ambulatory Visit: Payer: Self-pay

## 2019-08-20 DIAGNOSIS — R58 Hemorrhage, not elsewhere classified: Secondary | ICD-10-CM | POA: Diagnosis not present

## 2019-08-20 DIAGNOSIS — L89310 Pressure ulcer of right buttock, unstageable: Secondary | ICD-10-CM | POA: Diagnosis present

## 2019-08-20 DIAGNOSIS — G061 Intraspinal abscess and granuloma: Secondary | ICD-10-CM | POA: Diagnosis present

## 2019-08-20 DIAGNOSIS — E871 Hypo-osmolality and hyponatremia: Secondary | ICD-10-CM | POA: Diagnosis not present

## 2019-08-20 DIAGNOSIS — G834 Cauda equina syndrome: Secondary | ICD-10-CM | POA: Diagnosis not present

## 2019-08-20 DIAGNOSIS — Z452 Encounter for adjustment and management of vascular access device: Secondary | ICD-10-CM | POA: Diagnosis not present

## 2019-08-20 DIAGNOSIS — T8189XA Other complications of procedures, not elsewhere classified, initial encounter: Secondary | ICD-10-CM | POA: Diagnosis not present

## 2019-08-20 DIAGNOSIS — Z9889 Other specified postprocedural states: Secondary | ICD-10-CM | POA: Diagnosis not present

## 2019-08-20 DIAGNOSIS — Z96643 Presence of artificial hip joint, bilateral: Secondary | ICD-10-CM | POA: Diagnosis present

## 2019-08-20 DIAGNOSIS — A419 Sepsis, unspecified organism: Principal | ICD-10-CM | POA: Diagnosis present

## 2019-08-20 DIAGNOSIS — M47816 Spondylosis without myelopathy or radiculopathy, lumbar region: Secondary | ICD-10-CM | POA: Diagnosis not present

## 2019-08-20 DIAGNOSIS — Z87891 Personal history of nicotine dependence: Secondary | ICD-10-CM | POA: Diagnosis not present

## 2019-08-20 DIAGNOSIS — Z789 Other specified health status: Secondary | ICD-10-CM

## 2019-08-20 DIAGNOSIS — L89304 Pressure ulcer of unspecified buttock, stage 4: Secondary | ICD-10-CM | POA: Diagnosis not present

## 2019-08-20 DIAGNOSIS — L89894 Pressure ulcer of other site, stage 4: Secondary | ICD-10-CM | POA: Diagnosis not present

## 2019-08-20 DIAGNOSIS — E876 Hypokalemia: Secondary | ICD-10-CM | POA: Diagnosis not present

## 2019-08-20 DIAGNOSIS — G9341 Metabolic encephalopathy: Secondary | ICD-10-CM | POA: Diagnosis not present

## 2019-08-20 DIAGNOSIS — M48061 Spinal stenosis, lumbar region without neurogenic claudication: Secondary | ICD-10-CM | POA: Diagnosis present

## 2019-08-20 DIAGNOSIS — M4626 Osteomyelitis of vertebra, lumbar region: Secondary | ICD-10-CM | POA: Diagnosis not present

## 2019-08-20 DIAGNOSIS — Z8661 Personal history of infections of the central nervous system: Secondary | ICD-10-CM | POA: Diagnosis not present

## 2019-08-20 DIAGNOSIS — F1010 Alcohol abuse, uncomplicated: Secondary | ICD-10-CM | POA: Diagnosis not present

## 2019-08-20 DIAGNOSIS — L89150 Pressure ulcer of sacral region, unstageable: Secondary | ICD-10-CM | POA: Diagnosis not present

## 2019-08-20 DIAGNOSIS — D638 Anemia in other chronic diseases classified elsewhere: Secondary | ICD-10-CM | POA: Diagnosis not present

## 2019-08-20 DIAGNOSIS — Z792 Long term (current) use of antibiotics: Secondary | ICD-10-CM

## 2019-08-20 DIAGNOSIS — Z885 Allergy status to narcotic agent status: Secondary | ICD-10-CM | POA: Diagnosis not present

## 2019-08-20 DIAGNOSIS — R000 Tachycardia, unspecified: Secondary | ICD-10-CM | POA: Diagnosis not present

## 2019-08-20 DIAGNOSIS — R509 Fever, unspecified: Secondary | ICD-10-CM | POA: Diagnosis not present

## 2019-08-20 DIAGNOSIS — D649 Anemia, unspecified: Secondary | ICD-10-CM | POA: Diagnosis not present

## 2019-08-20 DIAGNOSIS — E669 Obesity, unspecified: Secondary | ICD-10-CM | POA: Diagnosis present

## 2019-08-20 DIAGNOSIS — M4604 Spinal enthesopathy, thoracic region: Secondary | ICD-10-CM | POA: Diagnosis not present

## 2019-08-20 DIAGNOSIS — Z95828 Presence of other vascular implants and grafts: Secondary | ICD-10-CM

## 2019-08-20 DIAGNOSIS — I081 Rheumatic disorders of both mitral and tricuspid valves: Secondary | ICD-10-CM | POA: Diagnosis not present

## 2019-08-20 DIAGNOSIS — L089 Local infection of the skin and subcutaneous tissue, unspecified: Secondary | ICD-10-CM | POA: Diagnosis not present

## 2019-08-20 DIAGNOSIS — L89312 Pressure ulcer of right buttock, stage 2: Secondary | ICD-10-CM | POA: Diagnosis not present

## 2019-08-20 DIAGNOSIS — Z20822 Contact with and (suspected) exposure to covid-19: Secondary | ICD-10-CM | POA: Diagnosis present

## 2019-08-20 DIAGNOSIS — L8993 Pressure ulcer of unspecified site, stage 3: Secondary | ICD-10-CM

## 2019-08-20 DIAGNOSIS — R29898 Other symptoms and signs involving the musculoskeletal system: Secondary | ICD-10-CM | POA: Diagnosis not present

## 2019-08-20 DIAGNOSIS — L8990 Pressure ulcer of unspecified site, unspecified stage: Secondary | ICD-10-CM

## 2019-08-20 DIAGNOSIS — M4804 Spinal stenosis, thoracic region: Secondary | ICD-10-CM | POA: Diagnosis not present

## 2019-08-20 DIAGNOSIS — R7881 Bacteremia: Secondary | ICD-10-CM | POA: Diagnosis not present

## 2019-08-20 DIAGNOSIS — T8142XA Infection following a procedure, deep incisional surgical site, initial encounter: Secondary | ICD-10-CM | POA: Diagnosis not present

## 2019-08-20 DIAGNOSIS — Z91030 Bee allergy status: Secondary | ICD-10-CM | POA: Diagnosis not present

## 2019-08-20 DIAGNOSIS — M4646 Discitis, unspecified, lumbar region: Secondary | ICD-10-CM | POA: Diagnosis not present

## 2019-08-20 DIAGNOSIS — Z7289 Other problems related to lifestyle: Secondary | ICD-10-CM | POA: Diagnosis not present

## 2019-08-20 DIAGNOSIS — M5144 Schmorl's nodes, thoracic region: Secondary | ICD-10-CM | POA: Diagnosis not present

## 2019-08-20 DIAGNOSIS — K6812 Psoas muscle abscess: Secondary | ICD-10-CM | POA: Diagnosis not present

## 2019-08-20 DIAGNOSIS — Z825 Family history of asthma and other chronic lower respiratory diseases: Secondary | ICD-10-CM | POA: Diagnosis not present

## 2019-08-20 DIAGNOSIS — Z6831 Body mass index (BMI) 31.0-31.9, adult: Secondary | ICD-10-CM | POA: Diagnosis not present

## 2019-08-20 DIAGNOSIS — Z8249 Family history of ischemic heart disease and other diseases of the circulatory system: Secondary | ICD-10-CM | POA: Diagnosis not present

## 2019-08-20 DIAGNOSIS — B9561 Methicillin susceptible Staphylococcus aureus infection as the cause of diseases classified elsewhere: Secondary | ICD-10-CM | POA: Diagnosis not present

## 2019-08-20 DIAGNOSIS — L89159 Pressure ulcer of sacral region, unspecified stage: Secondary | ICD-10-CM | POA: Diagnosis not present

## 2019-08-20 DIAGNOSIS — L0231 Cutaneous abscess of buttock: Secondary | ICD-10-CM | POA: Diagnosis not present

## 2019-08-20 DIAGNOSIS — M4316 Spondylolisthesis, lumbar region: Secondary | ICD-10-CM | POA: Diagnosis not present

## 2019-08-20 DIAGNOSIS — L03312 Cellulitis of back [any part except buttock]: Secondary | ICD-10-CM | POA: Diagnosis present

## 2019-08-20 LAB — COMPREHENSIVE METABOLIC PANEL
ALT: 17 U/L (ref 0–44)
AST: 19 U/L (ref 15–41)
Albumin: 2.1 g/dL — ABNORMAL LOW (ref 3.5–5.0)
Alkaline Phosphatase: 61 U/L (ref 38–126)
Anion gap: 13 (ref 5–15)
BUN: 9 mg/dL (ref 6–20)
CO2: 22 mmol/L (ref 22–32)
Calcium: 8.8 mg/dL — ABNORMAL LOW (ref 8.9–10.3)
Chloride: 96 mmol/L — ABNORMAL LOW (ref 98–111)
Creatinine, Ser: 0.93 mg/dL (ref 0.61–1.24)
GFR calc Af Amer: >60 mL/min (ref >60)
GFR calc non Af Amer: >60 mL/min (ref >60)
Glucose, Bld: 139 mg/dL — ABNORMAL HIGH (ref 70–99)
Potassium: 3.6 mmol/L (ref 3.5–5.1)
Sodium: 131 mmol/L — ABNORMAL LOW (ref 135–145)
Total Bilirubin: 1.2 mg/dL (ref 0.3–1.2)
Total Protein: 7.6 g/dL (ref 6.5–8.1)

## 2019-08-20 LAB — CBC WITH DIFFERENTIAL/PLATELET
Abs Immature Granulocytes: 0.09 10*3/uL — ABNORMAL HIGH (ref 0.00–0.07)
Basophils Absolute: 0.1 10*3/uL (ref 0.0–0.1)
Basophils Relative: 0 %
Eosinophils Absolute: 0.1 10*3/uL (ref 0.0–0.5)
Eosinophils Relative: 1 %
HCT: 32.4 % — ABNORMAL LOW (ref 39.0–52.0)
Hemoglobin: 10.7 g/dL — ABNORMAL LOW (ref 13.0–17.0)
Immature Granulocytes: 1 %
Lymphocytes Relative: 7 %
Lymphs Abs: 1 10*3/uL (ref 0.7–4.0)
MCH: 30.8 pg (ref 26.0–34.0)
MCHC: 33 g/dL (ref 30.0–36.0)
MCV: 93.4 fL (ref 80.0–100.0)
Monocytes Absolute: 0.7 10*3/uL (ref 0.1–1.0)
Monocytes Relative: 5 %
Neutro Abs: 12.8 10*3/uL — ABNORMAL HIGH (ref 1.7–7.7)
Neutrophils Relative %: 86 %
Platelets: 512 10*3/uL — ABNORMAL HIGH (ref 150–400)
RBC: 3.47 MIL/uL — ABNORMAL LOW (ref 4.22–5.81)
RDW: 14 % (ref 11.5–15.5)
WBC: 14.8 10*3/uL — ABNORMAL HIGH (ref 4.0–10.5)
nRBC: 0 % (ref 0.0–0.2)

## 2019-08-20 LAB — PROTIME-INR
INR: 1.5 — ABNORMAL HIGH (ref 0.8–1.2)
Prothrombin Time: 18.2 seconds — ABNORMAL HIGH (ref 11.4–15.2)

## 2019-08-20 LAB — LACTIC ACID, PLASMA
Lactic Acid, Venous: 3.2 mmol/L (ref 0.5–1.9)
Lactic Acid, Venous: 1 mmol/L (ref 0.5–1.9)

## 2019-08-20 LAB — APTT: aPTT: 38 seconds — ABNORMAL HIGH (ref 24–36)

## 2019-08-20 IMAGING — CT CT PELVIS W/ CM
2 of 4 series · 16 of 46 positions shown, 19 images · IV contrast (APPLIED)
Comparison: [DATE]

CLINICAL DATA: Right buttock abscess

EXAM:
CT PELVIS WITH CONTRAST
TECHNIQUE: Multidetector CT imaging of the pelvis was performed using the
standard protocol following the bolus administration of intravenous
contrast.
CONTRAST:  100mL OMNIPAQUE IOHEXOL 300 MG/ML  SOLN

[Series 5: soft tissue · axial · 0.85mm/px · z∈[-403,-71]mm · 13 of 184 slices shown, 16 images]
[im 12/184  soft-tissue]
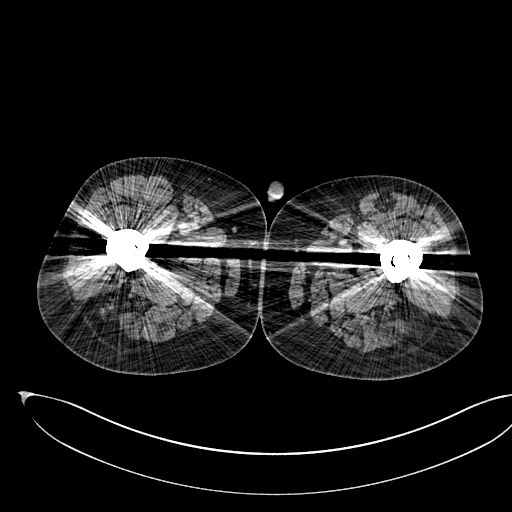
[im 12/184  bone]
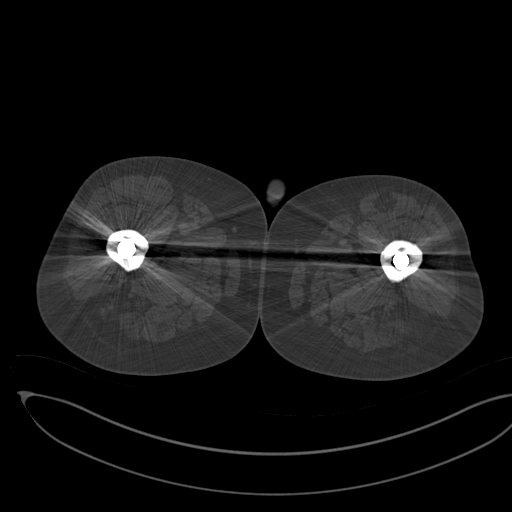
[im 30/184  soft-tissue]
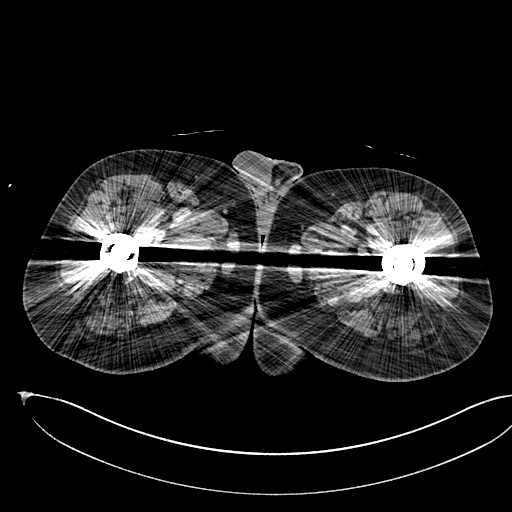
[im 54/184  soft-tissue]
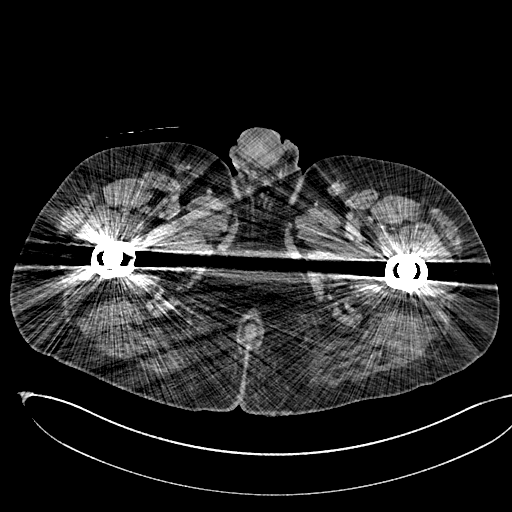
[im 71/184  soft-tissue]
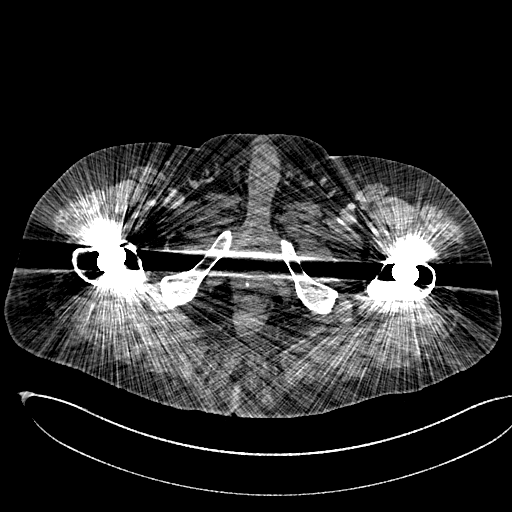
[im 89/184  soft-tissue]
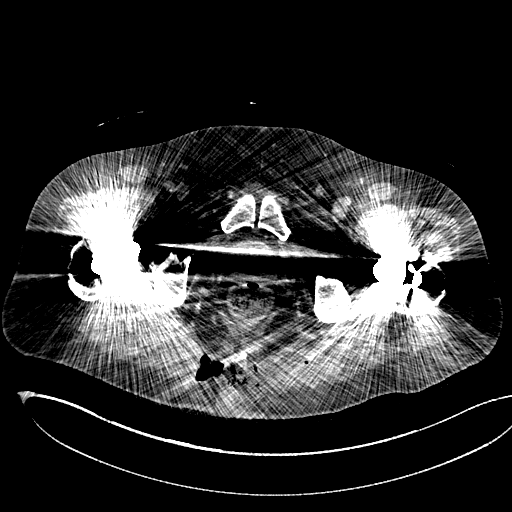
[im 101/184  soft-tissue]
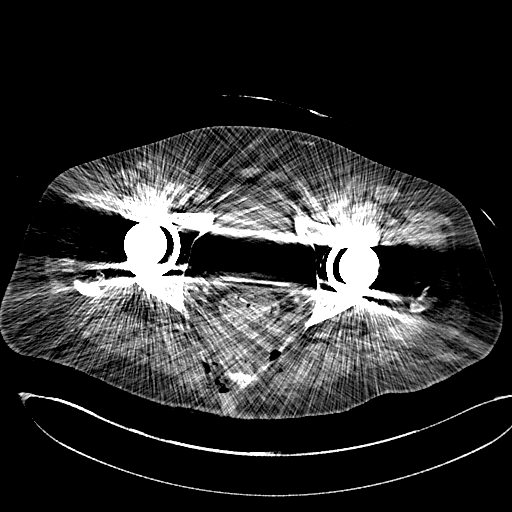
[im 119/184  soft-tissue]
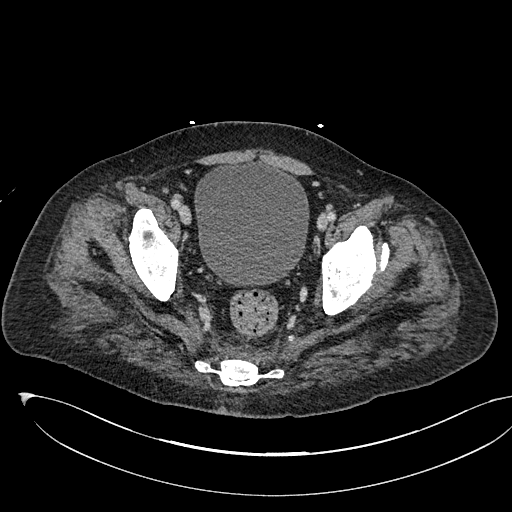
[im 136/184  soft-tissue]
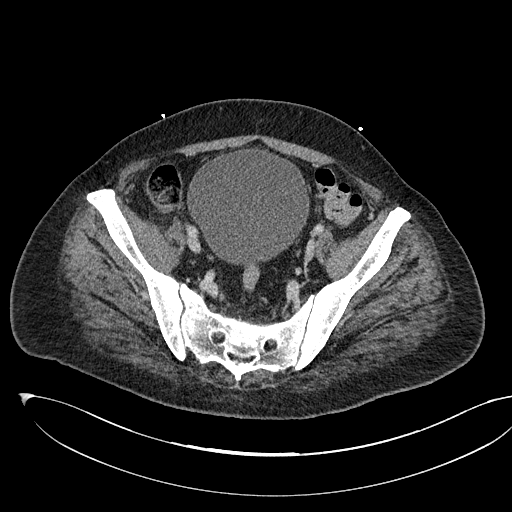
[im 154/184  soft-tissue]
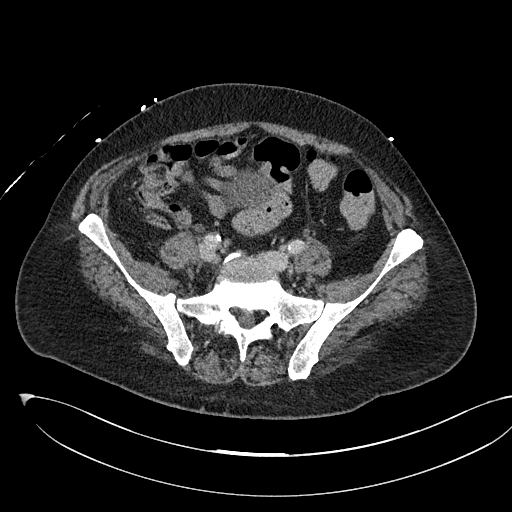
[im 154/184  bone]
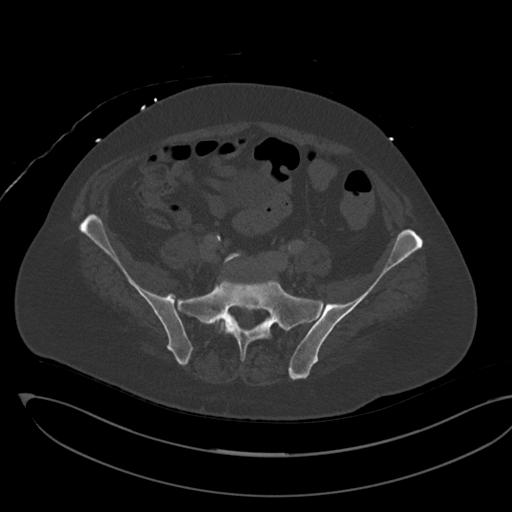
[im 160/184  lung]
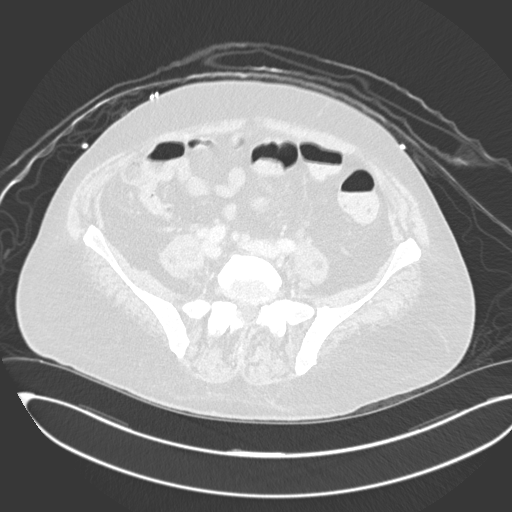
[im 166/184  lung]
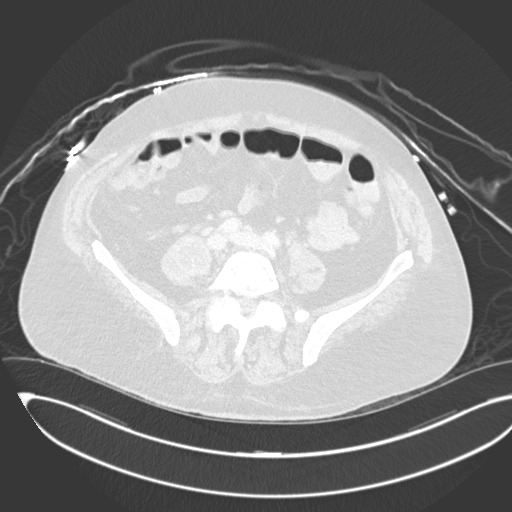
[im 172/184  soft-tissue]
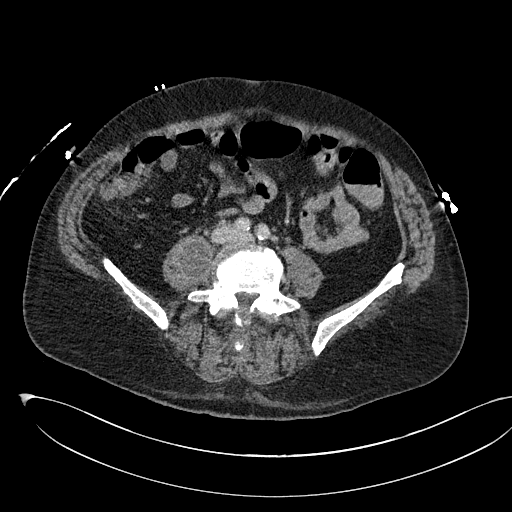
[im 172/184  lung]
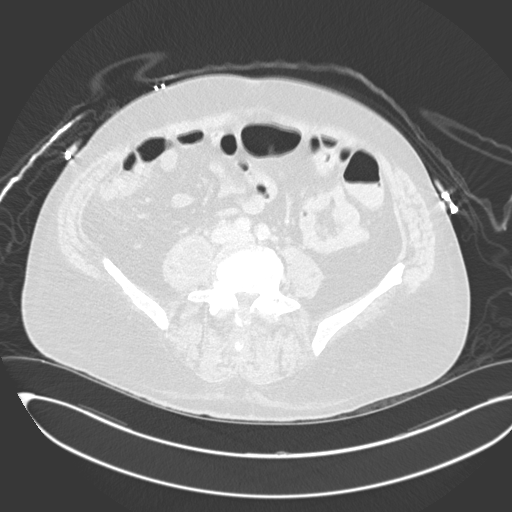
[im 178/184  lung]
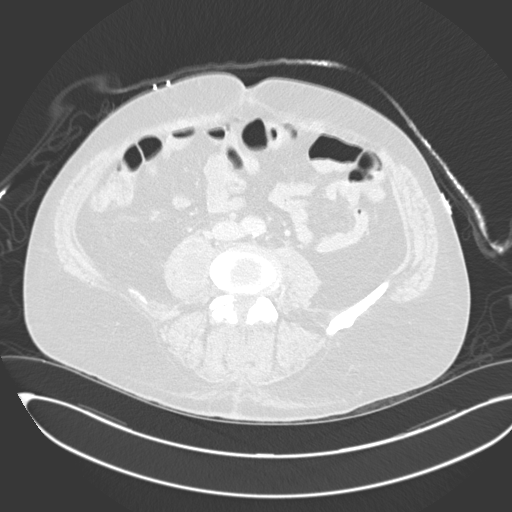

[Series 6: cor soft · coronal · 0.77mm/px · 3 of 148 slices shown]
[im 50/148  soft-tissue]
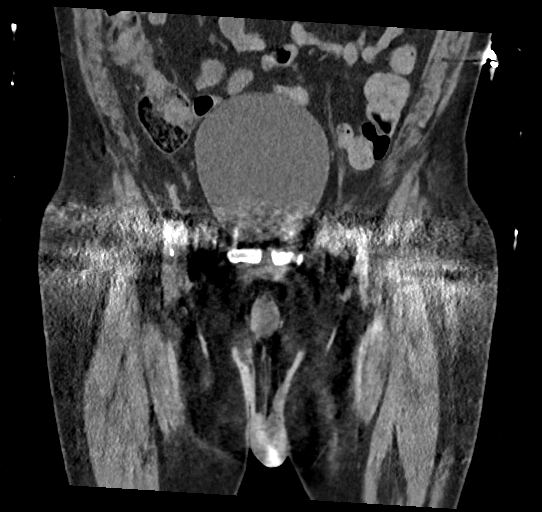
[im 66/148  soft-tissue]
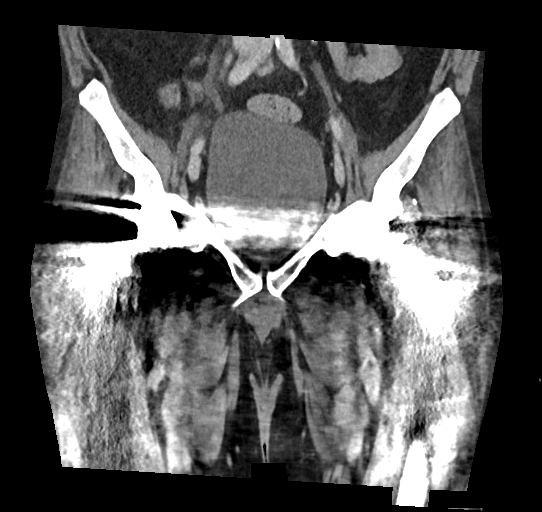
[im 82/148  soft-tissue]
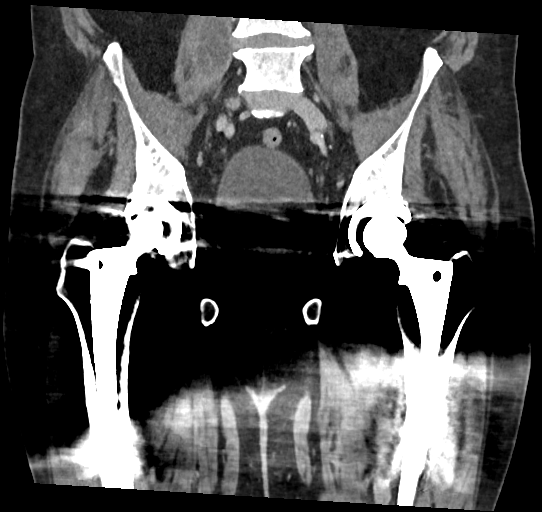

[16 of 46 positions shown; findings below may reference images not displayed]

FINDINGS: Urinary Tract:  No abnormality visualized.

Bowel:  Unremarkable visualized pelvic bowel loops.

Vascular/Lymphatic: Atherosclerosis.  No aneurysm or adenopathy.

Reproductive: Poorly visualized due to beam hardening artifact from
bilateral hip replacements.

Other:  No free fluid or free air.

Musculoskeletal: Image quality in the pelvis is degraded due to
extensive beam hardening artifact from bilateral hip replacements.
There is gas noted throughout the soft tissues surrounding the lower
sacrum and coccyx. No visible drainable fluid collection, but the
gas throughout the soft tissues is compatible with infection,
possibly with gas-forming organism.

In addition, there is a rim enhancing low-density area within the
right psoas muscle measuring 3.2 x 2.6 cm on axial image 13. On
coronal image 76, this extends off the superior aspect of the study.
The visualized portion has a craniocaudal length of 5.4 cm.
IMPRESSION: Gas within the soft tissue surrounding the lower sacrum and coccyx
concerning for infection with gas-forming organism. No visible
drainable fluid collection. Evaluation of this area is limited due
to beam hardening artifact from bilateral hip replacements.

Rim enhancing low-density area within the right psoas muscle which
concerning for intramuscular abscess. Full extent extends above the
1st image for this CT pelvis.

## 2019-08-20 MED ORDER — VANCOMYCIN HCL 10 G IV SOLR
2500.0000 mg | Freq: Once | INTRAVENOUS | Status: AC
  Administered 2019-08-20: 22:00:00 2500 mg via INTRAVENOUS
  Filled 2019-08-20: qty 2500

## 2019-08-20 MED ORDER — FOLIC ACID 1 MG PO TABS
1.0000 mg | ORAL_TABLET | Freq: Every day | ORAL | Status: DC
  Administered 2019-08-21 – 2019-08-28 (×8): 1 mg via ORAL
  Filled 2019-08-20 (×8): qty 1

## 2019-08-20 MED ORDER — VANCOMYCIN HCL 1250 MG/250ML IV SOLN
1250.0000 mg | Freq: Two times a day (BID) | INTRAVENOUS | Status: DC
  Administered 2019-08-21 – 2019-08-22 (×4): 1250 mg via INTRAVENOUS
  Filled 2019-08-20 (×5): qty 250

## 2019-08-20 MED ORDER — SODIUM CHLORIDE 0.9 % IV BOLUS
2267.0000 mL | Freq: Once | INTRAVENOUS | Status: AC
  Administered 2019-08-20: 2267 mL via INTRAVENOUS

## 2019-08-20 MED ORDER — THIAMINE HCL 100 MG/ML IJ SOLN
100.0000 mg | Freq: Every day | INTRAMUSCULAR | Status: DC
  Administered 2019-08-28: 09:00:00 100 mg via INTRAVENOUS
  Filled 2019-08-20: qty 2

## 2019-08-20 MED ORDER — ONDANSETRON HCL 4 MG/2ML IJ SOLN
4.0000 mg | Freq: Once | INTRAMUSCULAR | Status: AC
  Administered 2019-08-20: 4 mg via INTRAVENOUS
  Filled 2019-08-20: qty 2

## 2019-08-20 MED ORDER — SODIUM CHLORIDE 0.9% FLUSH
3.0000 mL | Freq: Once | INTRAVENOUS | Status: AC
  Administered 2019-08-20: 21:00:00 3 mL via INTRAVENOUS

## 2019-08-20 MED ORDER — LORAZEPAM 1 MG PO TABS: 1.0000 mg | ORAL_TABLET | ORAL | Status: AC | PRN

## 2019-08-20 MED ORDER — ACETAMINOPHEN 650 MG RE SUPP: 650.0000 mg | Freq: Four times a day (QID) | RECTAL | Status: DC | PRN

## 2019-08-20 MED ORDER — OXYCODONE HCL 10 MG PO TABS: 10.0000 mg | ORAL_TABLET | ORAL | 0 refills | Status: DC | PRN

## 2019-08-20 MED ORDER — TAMSULOSIN HCL 0.4 MG PO CAPS
0.4000 mg | ORAL_CAPSULE | Freq: Every day | ORAL | Status: DC
  Administered 2019-08-21 – 2019-08-27 (×7): 0.4 mg via ORAL
  Filled 2019-08-20 (×7): qty 1

## 2019-08-20 MED ORDER — ADULT MULTIVITAMIN W/MINERALS CH
1.0000 | ORAL_TABLET | Freq: Every day | ORAL | Status: DC
  Administered 2019-08-21 – 2019-08-28 (×8): 1 via ORAL
  Filled 2019-08-20 (×8): qty 1

## 2019-08-20 MED ORDER — MORPHINE SULFATE (PF) 4 MG/ML IV SOLN
4.0000 mg | Freq: Once | INTRAVENOUS | Status: AC
  Administered 2019-08-20: 23:00:00 4 mg via INTRAVENOUS
  Filled 2019-08-20: qty 1

## 2019-08-20 MED ORDER — IOHEXOL 300 MG/ML  SOLN
100.0000 mL | Freq: Once | INTRAMUSCULAR | Status: AC | PRN
  Administered 2019-08-20: 100 mL via INTRAVENOUS

## 2019-08-20 MED ORDER — SODIUM CHLORIDE 0.9 % IV SOLN: INTRAVENOUS | Status: AC

## 2019-08-20 MED ORDER — PIPERACILLIN-TAZOBACTAM 3.375 G IVPB
3.3750 g | Freq: Three times a day (TID) | INTRAVENOUS | Status: DC
  Administered 2019-08-21 – 2019-08-23 (×7): 3.375 g via INTRAVENOUS
  Filled 2019-08-20 (×8): qty 50

## 2019-08-20 MED ORDER — PIPERACILLIN-TAZOBACTAM 3.375 G IVPB 30 MIN
3.3750 g | Freq: Once | INTRAVENOUS | Status: AC
  Administered 2019-08-20: 21:00:00 3.375 g via INTRAVENOUS
  Filled 2019-08-20: qty 50

## 2019-08-20 MED ORDER — LORAZEPAM 2 MG/ML IJ SOLN: 1.0000 mg | INTRAMUSCULAR | Status: AC | PRN

## 2019-08-20 MED ORDER — THIAMINE HCL 100 MG PO TABS
100.0000 mg | ORAL_TABLET | Freq: Every day | ORAL | Status: DC
  Administered 2019-08-21 – 2019-08-27 (×7): 100 mg via ORAL
  Filled 2019-08-20 (×8): qty 1

## 2019-08-20 MED ORDER — ACETAMINOPHEN 325 MG PO TABS
650.0000 mg | ORAL_TABLET | Freq: Four times a day (QID) | ORAL | Status: DC | PRN
  Administered 2019-08-21 – 2019-08-27 (×3): 650 mg via ORAL
  Filled 2019-08-20 (×3): qty 2

## 2019-08-20 MED ORDER — SODIUM CHLORIDE 0.9 % IV BOLUS
1000.0000 mL | Freq: Once | INTRAVENOUS | Status: AC
  Administered 2019-08-20: 21:00:00 1000 mL via INTRAVENOUS

## 2019-08-20 NOTE — Progress Notes (Signed)
Pharmacy Antibiotic Note  [name] is a 54 y.o. male admitted on 08/20/2019 with sepsis. Presented with a wound to the central buttock region. Was previously admitted for the wound and discharged on was on nafcillin 12 gm IV daily via PICC line with intended stop date of 09/11/2019. Patient presents to the ED with elevated fever (Tmax 103F).  WBC is elevated to 14.8. Scr is elevated to 0.93 (baseline ~0.70). Pharmacy has been consulted for vancomycin dosing.  Plan: Vancomycin 2500 mg IV x1 dose; then Vancomycin 1250 mg IV Q 12 hrs.   - Goal AUC 400-550.  - Expected AUC: 522.2  - SCr used: 0.93  - Wt used: 109.9 kg   - Obtain vancomycin levels as needed  Monitor clinical improvement, cultures, and renal function     Temp (24hrs), Avg:100.1 F (37.8 C), Min:99.4 F (37.4 C), Max:100.7 F (38.2 C)  Recent Labs  Lab 08/20/19 2016  WBC 14.8*  CREATININE 0.93    CrCl cannot be calculated (Unknown ideal weight.).    Allergies  Allergen Reactions  . Bee Venom Anaphylaxis  . Hydrocodone Nausea And Vomiting    Antimicrobials this admission: 1/25 Zosyn >> 1/25 Vancomycin >>   Dose adjustments this admission:  Microbiology results: 1/25 BCx:  1/25 UCx:    Thank you for allowing pharmacy to be a part of this patient's care.  Acey Lav, PharmD  PGY1 Acute Care Pharmacy Resident 08/20/2019 9:00 PM

## 2019-08-20 NOTE — ED Triage Notes (Signed)
Pt BIB [facility] from home, c/o sore with drainage to his buttock. Pt had back surgery, had an extended hospital stay and has been less mobile than usual. Pt receiving abx through a picc. EMS reports a fever of 103, given 1g tylenol PTA, HR 120, BP 130/60. Denies known COVID contacts.

## 2019-08-20 NOTE — Telephone Encounter (Signed)
Contacted patients daughter and notified.  Patient's daughter says that she is most likely going to take him back to the hospital.  She describes the patient as 'being out of it'. He is not recognizing people. She feels taking him back to the [place] is the only option.

## 2019-08-20 NOTE — ED Provider Notes (Signed)
[facility] EMERGENCY DEPARTMENT Provider Note   CSN: [id number] Arrival date & time: 08/20/19  1945     History Chief Complaint  Patient presents with  . Fever  . Tachycardia    [name] is a 54 y.o. male.  HPI      [name] is a 55 y.o. male, with a history of obesity, epidural abscess, cauda equina syndrome, elevated blood sugar, presenting to the ED with a wound to the central buttock region.  The wound has been present since before his discharge from the hospital January 15 and was not previously painful, however, for the last week he has had worsening pain, onset of foul smell and drainage. Pain is moderate to severe, sharp, radiating throughout the buttock region. EMS reports fever of 103 F; patient received 1 g Tylenol. Patient was admitted to the hospital with cauda equina on December 29 and taken to the OR for decompression.  After his hospital stay, he was sent to rehabilitation facility until January 15.  He has been receiving regular infusions of nafcillin via PICC line with last dose 1 PM today.  He has had a home health nurse caring for his wound.   Patient denies cough, shortness of breath, chest pain, abdominal pain, pain with bowel movements, N/V/D, difficulty urinating, numbness, weakness, or any other complaints.     Past Medical History:  Diagnosis Date  . History of chicken pox   . Medical history non-contributory     Patient Active Problem List   Diagnosis Date Noted  . Sepsis ([place]) 08/20/2019  . Infected pressure ulcer 08/20/2019  . Psoas abscess, right ([place]) 08/20/2019  . Hyponatremia 08/20/2019  . Alcohol use 08/20/2019  . Pressure injury of skin 08/02/2019  . Cauda equina syndrome ([place]) 07/31/2019  . Neurogenic bladder 07/31/2019  . Bacteremia due to methicillin susceptible Staphylococcus aureus (MSSA) 07/30/2019  . Lumbar discitis 07/30/2019  . Cerebral septic emboli ([place]) 07/30/2019  . S/P lumbar laminectomy  07/25/2019  . Epidural abscess, L2-L5 07/25/2019  . Polymyalgia rheumatica ([place]) 07/24/2019  . Acute bilateral low back pain without sciatica 07/18/2019  . Abdominal pain 07/18/2019  . Anemia 07/18/2019  . Obesity (BMI 35.0-39.9 without comorbidity) 07/18/2019  . Constipation 07/18/2019  . Bilateral leg pain 07/12/2019  . Bilateral leg edema 04/01/2017  . Elevated blood sugar 04/01/2017  . Avascular necrosis of bone of hip, left ([place]) 10/12/2016  . Alcohol abuse, daily use 10/12/2016  . S/P total hip arthroplasty 10/12/2016    Past Surgical History:  Procedure Laterality Date  . CARPAL TUNNEL RELEASE Bilateral 2009  . COLONOSCOPY W/ POLYPECTOMY    . JOINT REPLACEMENT    . LACERATION REPAIR Right ~ 2012   leg  . LUMBAR LAMINECTOMY/DECOMPRESSION MICRODISCECTOMY N/A 07/24/2019   Procedure: Decompressive Lumbar Laminectomy Lumbar three-four Lumbar four-five for Epidural Abscess;  Surgeon: Kary Kos, MD;  Location: [facility];  Service: Neurosurgery;  Laterality: N/A;  . TOTAL HIP ARTHROPLASTY Right 01/11/2013   Procedure: TOTAL HIP ARTHROPLASTY;  Surgeon: Garald Balding, MD;  Location: [facility];  Service: Orthopedics;  Laterality: Right;  . TOTAL HIP ARTHROPLASTY Left 10/12/2016   Procedure: TOTAL HIP ARTHROPLASTY;  Surgeon: Garald Balding, MD;  Location: [facility];  Service: Orthopedics;  Laterality: Left;       Family History  Problem Relation Age of Onset  . Cancer Father        Hodgkin's disease  . COPD Mother   . Heart attack Maternal  Grandfather 80  . Diabetes Neg Hx   . Stroke Neg Hx   . Hypertension Neg Hx   . Hyperlipidemia Neg Hx     Social History   Tobacco Use  . Smoking status: Former Smoker    Packs/day: 1.00    Years: 14.00    Pack years: 14.00    Types: Cigarettes    Quit date: 07/27/1999    Years since quitting: 20.0  . Smokeless tobacco: Never Used  Substance Use Topics  . Alcohol use: Yes    Alcohol/week: 24.0 standard drinks    Types: 24 Cans of beer  per week    Comment: 4-5 beers per day, case of beer per week  . Drug use: No    Home Medications Prior to Admission medications   Medication Sig Start Date End Date Taking? Authorizing Provider  acetaminophen (TYLENOL) 325 MG tablet Take 2 tablets (650 mg total) by mouth every 4 (four) hours as needed for mild pain ((score 1 to 3) or temp > 100.5). 08/09/19  Yes Angiulli, Lavon Paganini, PA-C  cyclobenzaprine (FLEXERIL) 10 MG tablet Take 1 tablet (10 mg total) by mouth 3 (three) times daily as needed for muscle spasms. 08/09/19  Yes Angiulli, Lavon Paganini, PA-C  nafcillin IVPB Inject 12 g into the vein daily. As a continuous infusion Indication:  Disseminated MSSA infection Last Day of Therapy:  09/04/19 Labs - Once weekly:  CBC/D and BMP, Labs - Every other week:  ESR and CRP 08/07/19 09/11/19 Yes Angiulli, Lavon Paganini, PA-C  Oxycodone HCl 10 MG TABS Take 1 tablet (10 mg total) by mouth every 4 (four) hours as needed ((score 7 to 10)). Patient taking differently: Take 10 mg by mouth every 4 (four) hours as needed (For pain).  08/20/19  Yes Lovorn, Jinny Blossom, MD  bisacodyl (DULCOLAX) 10 MG suppository Place 1 suppository (10 mg total) rectally daily as needed for moderate constipation. Patient not taking: Reported on 08/20/2019 08/09/19   Angiulli, Lavon Paganini, PA-C  Multiple Vitamin (MULTIVITAMIN WITH MINERALS) TABS tablet Take 1 tablet by mouth daily. Patient not taking: Reported on 08/20/2019 08/10/19   Angiulli, Lavon Paganini, PA-C  pantoprazole (PROTONIX) 40 MG tablet Take 1 tablet (40 mg total) by mouth at bedtime. Patient not taking: Reported on 08/20/2019 08/09/19   Angiulli, Lavon Paganini, PA-C  polyethylene glycol (MIRALAX / GLYCOLAX) 17 g packet Take 17 g by mouth daily as needed for mild constipation. Patient not taking: Reported on 08/20/2019 08/09/19   Angiulli, Lavon Paganini, PA-C  predniSONE (DELTASONE) 5 MG tablet Take 1 tablet (5 mg total) by mouth daily with breakfast. Patient not taking: Reported on 08/20/2019 08/10/19    Angiulli, Lavon Paganini, PA-C  senna (SENOKOT) 8.6 MG TABS tablet Take 2 tablets (17.2 mg total) by mouth daily. Patient not taking: Reported on 08/20/2019 08/10/19   Angiulli, Lavon Paganini, PA-C  sodium chloride 0.9 % SOLN 452 mL with nafcillin 2 g SOLR 12 g Inject 12 g into the vein daily. Patient not taking: Reported on 08/20/2019 08/09/19   Angiulli, Lavon Paganini, PA-C  tamsulosin (FLOMAX) 0.4 MG CAPS capsule Take 1 capsule (0.4 mg total) by mouth daily after supper. Patient not taking: Reported on 08/20/2019 08/09/19   Angiulli, Lavon Paganini, PA-C  traZODone (DESYREL) 50 MG tablet Take 1 tablet (50 mg total) by mouth at bedtime as needed for sleep. Patient not taking: Reported on 08/20/2019 08/09/19   Angiulli, Lavon Paganini, PA-C    Allergies    Bee venom and  Hydrocodone  Review of Systems   Review of Systems  Constitutional: Positive for fever.  Respiratory: Negative for cough and shortness of breath.   Cardiovascular: Negative for chest pain.  Gastrointestinal: Negative for abdominal pain, diarrhea, nausea and vomiting.  Skin: Positive for wound.  Neurological: Negative for syncope, weakness and numbness.  All other systems reviewed and are negative.   Physical Exam Updated Vital Signs BP 131/74 (BP Location: Right Arm)   Pulse (!) 112   Temp 99.4 F (37.4 C) (Oral)   Resp 16   SpO2 95%   Physical Exam Vitals and nursing note reviewed.  Constitutional:      General: He is not in acute distress.    Appearance: He is well-developed. He is not diaphoretic.  HENT:     Head: Normocephalic and atraumatic.     Mouth/Throat:     Mouth: Mucous membranes are moist.     Pharynx: Oropharynx is clear.  Eyes:     Conjunctiva/sclera: Conjunctivae normal.  Cardiovascular:     Rate and Rhythm: Regular rhythm. Tachycardia present.     Pulses: Normal pulses.          Radial pulses are 2+ on the right side and 2+ on the left side.       Posterior tibial pulses are 2+ on the right side and 2+ on the left  side.     Heart sounds: Normal heart sounds.     Comments: Tactile temperature in the extremities appropriate and equal bilaterally. Pulmonary:     Effort: Pulmonary effort is normal. No respiratory distress.     Breath sounds: Normal breath sounds.  Abdominal:     Palpations: Abdomen is soft.     Tenderness: There is no abdominal tenderness. There is no guarding.  Musculoskeletal:     Cervical back: Neck supple.     Right lower leg: No edema.     Left lower leg: No edema.  Lymphadenopathy:     Cervical: No cervical adenopathy.  Skin:    General: Skin is warm and dry.     Comments: Large midline sacral wound and right gluteal wound with bloody purulence.  Surrounding erythema and tenderness. Patient's prior surgical wound is superior to this area and has no appearance of infection.  Neurological:     Mental Status: He is alert.  Psychiatric:        Mood and Affect: Mood and affect normal.        Speech: Speech normal.        Behavior: Behavior normal.                 ED Results / Procedures / Treatments   Labs (all labs ordered are listed, but only abnormal results are displayed) Labs Reviewed  LACTIC ACID, PLASMA - Abnormal; Notable for the following components:      Result Value   Lactic Acid, Venous 3.2 (*)    All other components within normal limits  COMPREHENSIVE METABOLIC PANEL - Abnormal; Notable for the following components:   Sodium 131 (*)    Chloride 96 (*)    Glucose, Bld 139 (*)    Calcium 8.8 (*)    Albumin 2.1 (*)    All other components within normal limits  CBC WITH DIFFERENTIAL/PLATELET - Abnormal; Notable for the following components:   WBC 14.8 (*)    RBC 3.47 (*)    Hemoglobin 10.7 (*)    HCT 32.4 (*)    Platelets 512 (*)  Neutro Abs 12.8 (*)    Abs Immature Granulocytes 0.09 (*)    All other components within normal limits  APTT - Abnormal; Notable for the following components:   aPTT 38 (*)    All other components within  normal limits  PROTIME-INR - Abnormal; Notable for the following components:   Prothrombin Time 18.2 (*)    INR 1.5 (*)    All other components within normal limits  CULTURE, BLOOD (ROUTINE X 2)  CULTURE, BLOOD (ROUTINE X 2)  URINE CULTURE  RESPIRATORY PANEL BY RT PCR (FLU A&B, COVID)  LACTIC ACID, PLASMA  URINALYSIS, ROUTINE W REFLEX MICROSCOPIC  CBC  MAGNESIUM  PHOSPHORUS  BASIC METABOLIC PANEL    EKG EKG Interpretation  Date/Time:  Monday August 20 2019 20:43:00 EST Ventricular Rate:  103 PR Interval:    QRS Duration: 95 QT Interval:  335 QTC Calculation: 439 R Axis:   49 Text Interpretation: Sinus tachycardia Abnormal R-wave progression, early transition Borderline T abnormalities, anterior leads Baseline wander in lead(s) V5 since last tracing no significant change Confirmed by Malvin Johns [phone]) on 08/20/2019 10:41:28 PM   Radiology CT PELVIS W CONTRAST  Result Date: 08/20/2019 CLINICAL DATA:  Right buttock abscess EXAM: CT PELVIS WITH CONTRAST TECHNIQUE: Multidetector CT imaging of the pelvis was performed using the standard protocol following the bolus administration of intravenous contrast. CONTRAST:  161m OMNIPAQUE IOHEXOL 300 MG/ML  SOLN COMPARISON:  07/19/2019 FINDINGS: Urinary Tract:  No abnormality visualized. Bowel:  Unremarkable visualized pelvic bowel loops. Vascular/Lymphatic: Atherosclerosis.  No aneurysm or adenopathy. Reproductive: Poorly visualized due to beam hardening artifact from bilateral hip replacements. Other:  No free fluid or free air. Musculoskeletal: Image quality in the pelvis is degraded due to extensive beam hardening artifact from bilateral hip replacements. There is gas noted throughout the soft tissues surrounding the lower sacrum and coccyx. No visible drainable fluid collection, but the gas throughout the soft tissues is compatible with infection, possibly with gas-forming organism. In addition, there is a rim enhancing low-density area  within the right psoas muscle measuring 3.2 x 2.6 cm on axial image 13. On coronal image 76, this extends off the superior aspect of the study. The visualized portion has a craniocaudal length of 5.4 cm. IMPRESSION: Gas within the soft tissue surrounding the lower sacrum and coccyx concerning for infection with gas-forming organism. No visible drainable fluid collection. Evaluation of this area is limited due to beam hardening artifact from bilateral hip replacements. Rim enhancing low-density area within the right psoas muscle which concerning for intramuscular abscess. Full extent extends above the 1st image for this CT pelvis. Electronically Signed   By: KRolm BaptiseM.D.   On: 08/20/2019 21:45    Procedures .Critical Care Performed by: JLorayne Bender PA-C Authorized by: JLorayne Bender PA-C   Critical care provider statement:    Critical care time (minutes):  35   Critical care time was exclusive of:  Separately billable procedures and treating other patients   Critical care was necessary to treat or prevent imminent or life-threatening deterioration of the following conditions:  Sepsis   Critical care was time spent personally by me on the following activities:  Development of treatment plan with patient or surrogate, discussions with consultants, examination of patient, evaluation of patient's response to treatment, obtaining history from patient or surrogate, ordering and performing treatments and interventions, ordering and review of laboratory studies, ordering and review of radiographic studies, pulse oximetry, re-evaluation of patient's condition and review of  old charts   I assumed direction of critical care for this patient from another provider in my specialty: no     (including critical care time)  Medications Ordered in ED Medications  vancomycin (VANCOREADY) IVPB 1250 mg/250 mL (has no administration in time range)  tamsulosin (FLOMAX) capsule 0.4 mg (has no administration in time  range)  acetaminophen (TYLENOL) tablet 650 mg (has no administration in time range)    Or  acetaminophen (TYLENOL) suppository 650 mg (has no administration in time range)  0.9 %  sodium chloride infusion (has no administration in time range)  LORazepam (ATIVAN) tablet 1-4 mg (has no administration in time range)    Or  LORazepam (ATIVAN) injection 1-4 mg (has no administration in time range)  thiamine tablet 100 mg (has no administration in time range)    Or  thiamine (B-1) injection 100 mg (has no administration in time range)  folic acid (FOLVITE) tablet 1 mg (has no administration in time range)  multivitamin with minerals tablet 1 tablet (has no administration in time range)  piperacillin-tazobactam (ZOSYN) IVPB 3.375 g (has no administration in time range)  sodium chloride flush (NS) 0.9 % injection 3 mL (3 mLs Intravenous Given 08/20/19 2057)  sodium chloride 0.9 % bolus 1,000 mL (0 mLs Intravenous Stopped 08/20/19 2320)  piperacillin-tazobactam (ZOSYN) IVPB 3.375 g (0 g Intravenous Stopped 08/20/19 2130)  vancomycin (VANCOCIN) 2,500 mg in sodium chloride 0.9 % 500 mL IVPB (0 mg Intravenous Stopped 08/20/19 2321)  iohexol (OMNIPAQUE) 300 MG/ML solution 100 mL (100 mLs Intravenous Contrast Given 08/20/19 2112)  sodium chloride 0.9 % bolus 2,267 mL (2,267 mLs Intravenous New Bag/Given 08/20/19 2321)  morphine 4 MG/ML injection 4 mg (4 mg Intravenous Given 08/20/19 2324)  ondansetron (ZOFRAN) injection 4 mg (4 mg Intravenous Given 08/20/19 2324)    ED Course  I have reviewed the triage vital signs and the nursing notes.  Pertinent labs & imaging results that were available during my care of the patient were reviewed by me and considered in my medical decision making (see chart for details).  Clinical Course as of Aug 20 5  Mon Aug 20, 2019  2216 Spoke with Dr. Donne Hazel, general surgeon.  He reviewed the CT.  He states the air on the CT is likely due to the fact the patient has an open  wound. Unlikely to be necrotizing infection due to timeline.  Admit via medicine. He will come look at the wound and consult.    [SJ]    Clinical Course User Index [SJ] Vaiden Adames, Helane Gunther, PA-C   MDM Rules/Calculators/A&P                      Patient presents with wound to the sacral region.  Febrile, tachycardic, leukocytosis.  Code sepsis activated. CT with large wound and psoas abscess. Dr. Tamera Punt discussed admission with Dr. Marlowe Sax, hospitalist, who will admit the patient.  Findings and plan of care discussed with Malvin Johns, MD. Dr. Tamera Punt personally evaluated and examined this patient.  Vitals:   08/20/19 2245 08/20/19 2315 08/20/19 2330 08/21/19 0007  BP: 111/67 104/75 105/67 110/72  Pulse: 94 88 85 88  Resp: _0 Temp:    99.3 F (37.4 C)  TempSrc:    Oral  SpO2: 98% 98% 97% 94%  Weight:      Height:         Final Clinical Impression(s) / ED Diagnoses Final diagnoses:  Wound infection  Psoas abscess [facility])    Rx / DC Orders ED Discharge Orders    None       Layla Maw 08/21/19 0012    Malvin Johns, MD 08/22/19 1128

## 2019-08-20 NOTE — Telephone Encounter (Signed)
I called the nurse Diane and spoke with her and informed her that the provider is ok with  the [facility] orders, she understood.  Keajah Killough,cma

## 2019-08-20 NOTE — Telephone Encounter (Signed)
Diane, RN, Alvis Lemmings left a message stating that patient is in a lot of pain.  She states he has a level 4 pressure ulcer on his spine.Marland Kitchen  He is out of his pain meds.  He is scheduled to see Dr. Ranell Patrick on Wednesday.  He needs a refill on his oxycodone

## 2019-08-20 NOTE — ED Notes (Signed)
This RN attempted to update family, will try again in a half hour

## 2019-08-20 NOTE — Progress Notes (Signed)
Pharmacy Antibiotic Note  [name] is a 54 y.o. male admitted on 08/20/2019 with sacral pressure ulcer with superinfection.  Pharmacy has been consulted for Zosyn dosing.  Plan: Zosyn 3.375g IV q8h (4-hour infusion).  Height: 6\' 1"  (185.4 cm) Weight: 240 lb (108.9 kg) IBW/kg (Calculated) : 79.9  Temp (24hrs), Avg:100.1 F (37.8 C), Min:99.4 F (37.4 C), Max:100.7 F (38.2 C)  Recent Labs  Lab 08/20/19 2016  WBC 14.8*  CREATININE 0.93  LATICACIDVEN 3.2*    Estimated Creatinine Clearance: 117.5 mL/min (by C-G formula based on SCr of 0.93 mg/dL).    Allergies  Allergen Reactions  . Bee Venom Anaphylaxis  . Hydrocodone Nausea And Vomiting     Thank you for allowing pharmacy to be a part of this patient's care.  Wynona Neat, PharmD, BCPS  08/20/2019 11:11 PM

## 2019-08-20 NOTE — Consult Note (Addendum)
Reason for Consult:sacral wound Referring Physician: Arlean Hopping PA  [name] is an 54 y.o. male.  HPI: 65 yom who is otherwise healthy until hospitalization from 12/30-1/15. He presented with progressive le weakness and back pain with urinary retention 12/30. Was seen by neurosurgery and then taken to or for epidural abscess. He also had psoas abscess perc drained at that time.  He was then discharged to rehab where he stayed until 1/15 then sent home patient reports he had pressure ulcer during this time that has worsened at home. He states for last week this has been painful, draining with some blood out and red.  His family finally made him come to er today. He has not had follow up otherwise. He underwent ct scan that showed gas in soft tissue near sacrum and I was called. He also has incompletely imaged right psoas abscess as well.  He is not completely appropriate when I speak to him tonight.   Past Medical History:  Diagnosis Date  . History of chicken pox   . Medical history non-contributory     Past Surgical History:  Procedure Laterality Date  . CARPAL TUNNEL RELEASE Bilateral 2009  . COLONOSCOPY W/ POLYPECTOMY    . JOINT REPLACEMENT    . LACERATION REPAIR Right ~ 2012   leg  . LUMBAR LAMINECTOMY/DECOMPRESSION MICRODISCECTOMY N/A 07/24/2019   Procedure: Decompressive Lumbar Laminectomy Lumbar three-four Lumbar four-five for Epidural Abscess;  Surgeon: Kary Kos, MD;  Location: [facility];  Service: Neurosurgery;  Laterality: N/A;  . TOTAL HIP ARTHROPLASTY Right 01/11/2013   Procedure: TOTAL HIP ARTHROPLASTY;  Surgeon: Garald Balding, MD;  Location: [facility];  Service: Orthopedics;  Laterality: Right;  . TOTAL HIP ARTHROPLASTY Left 10/12/2016   Procedure: TOTAL HIP ARTHROPLASTY;  Surgeon: Garald Balding, MD;  Location: [facility];  Service: Orthopedics;  Laterality: Left;    Family History  Problem Relation Age of Onset  . Cancer Father        Hodgkin's disease  . COPD Mother   .  Heart attack Maternal Grandfather 80  . Diabetes Neg Hx   . Stroke Neg Hx   . Hypertension Neg Hx   . Hyperlipidemia Neg Hx     Social History:  reports that he quit smoking about 20 years ago. His smoking use included cigarettes. He has a 14.00 pack-year smoking history. He has never used smokeless tobacco. He reports current alcohol use of about 24.0 standard drinks of alcohol per week. He reports that he does not use drugs.  Allergies:  Allergies  Allergen Reactions  . Bee Venom Anaphylaxis  . Hydrocodone Nausea And Vomiting    Medications: I have reviewed the patient's current medications.  Results for orders placed or performed during the hospital encounter of 08/20/19 (from the past 48 hour(s))  Lactic acid, plasma     Status: Abnormal   Collection Time: 08/20/19  8:16 PM  Result Value Ref Range   Lactic Acid, Venous 3.2 (HH) 0.5 - 1.9 mmol/L    Comment: CRITICAL RESULT CALLED TO, READ BACK BY AND VERIFIED WITH: Gaetano Net [id number] 08/20/2019 WBOND Performed at [facility], [place] [address] [place], [place] [address]   Comprehensive metabolic panel     Status: Abnormal   Collection Time: 08/20/19  8:16 PM  Result Value Ref Range   Sodium 131 (L) 135 - 145 mmol/L   Potassium 3.6 3.5 - 5.1 mmol/L   Chloride 96 (L) 98 - 111 mmol/L  CO2 22 22 - 32 mmol/L   Glucose, Bld 139 (H) 70 - 99 mg/dL   BUN 9 6 - 20 mg/dL   Creatinine, Ser 0.93 0.61 - 1.24 mg/dL   Calcium 8.8 (L) 8.9 - 10.3 mg/dL   Total Protein 7.6 6.5 - 8.1 g/dL   Albumin 2.1 (L) 3.5 - 5.0 g/dL   AST 19 15 - 41 U/L   ALT 17 0 - 44 U/L   Alkaline Phosphatase 61 38 - 126 U/L   Total Bilirubin 1.2 0.3 - 1.2 mg/dL   GFR calc non Af Amer >60 >60 mL/min   GFR calc Af Amer >60 >60 mL/min   Anion gap 13 5 - 15    Comment: Performed at [facility] [address], [place], [place] [address]  CBC with Differential     Status: Abnormal   Collection Time: 08/20/19  8:16 PM  Result Value Ref Range   WBC 14.8  (H) 4.0 - 10.5 K/uL   RBC 3.47 (L) 4.22 - 5.81 MIL/uL   Hemoglobin 10.7 (L) 13.0 - 17.0 g/dL   HCT 32.4 (L) 39.0 - 52.0 %   MCV 93.4 80.0 - 100.0 fL   MCH 30.8 26.0 - 34.0 pg   MCHC 33.0 30.0 - 36.0 g/dL   RDW 14.0 11.5 - 15.5 %   Platelets 512 (H) 150 - 400 K/uL   nRBC 0.0 0.0 - 0.2 %   Neutrophils Relative % 86 %   Neutro Abs 12.8 (H) 1.7 - 7.7 K/uL   Lymphocytes Relative 7 %   Lymphs Abs 1.0 0.7 - 4.0 K/uL   Monocytes Relative 5 %   Monocytes Absolute 0.7 0.1 - 1.0 K/uL   Eosinophils Relative 1 %   Eosinophils Absolute 0.1 0.0 - 0.5 K/uL   Basophils Relative 0 %   Basophils Absolute 0.1 0.0 - 0.1 K/uL   Immature Granulocytes 1 %   Abs Immature Granulocytes 0.09 (H) 0.00 - 0.07 K/uL    Comment: Performed at [facility] [address] [place], [place] [address]  APTT     Status: Abnormal   Collection Time: 08/20/19  8:57 PM  Result Value Ref Range   aPTT 38 (H) 24 - 36 seconds    Comment:        IF BASELINE aPTT IS ELEVATED, SUGGEST PATIENT RISK ASSESSMENT BE USED TO DETERMINE APPROPRIATE ANTICOAGULANT THERAPY. Performed at [facility], [place] [address], [place], [place] [address]   Protime-INR     Status: Abnormal   Collection Time: 08/20/19  8:57 PM  Result Value Ref Range   Prothrombin Time 18.2 (H) 11.4 - 15.2 seconds   INR 1.5 (H) 0.8 - 1.2    Comment: (NOTE) INR goal varies based on device and disease states. Performed at [facility], [place] [address], [place], [place] [address]     CT PELVIS W CONTRAST  Result Date: 08/20/2019 CLINICAL DATA:  Right buttock abscess EXAM: CT PELVIS WITH CONTRAST TECHNIQUE: Multidetector CT imaging of the pelvis was performed using the standard protocol following the bolus administration of intravenous contrast. CONTRAST:  148mL OMNIPAQUE IOHEXOL 300 MG/ML  SOLN COMPARISON:  07/19/2019 FINDINGS: Urinary Tract:  No abnormality visualized. Bowel:  Unremarkable visualized pelvic bowel loops. Vascular/Lymphatic:  Atherosclerosis.  No aneurysm or adenopathy. Reproductive: Poorly visualized due to beam hardening artifact from bilateral hip replacements. Other:  No free fluid or free air. Musculoskeletal: Image quality in the pelvis is degraded  due to extensive beam hardening artifact from bilateral hip replacements. There is gas noted throughout the soft tissues surrounding the lower sacrum and coccyx. No visible drainable fluid collection, but the gas throughout the soft tissues is compatible with infection, possibly with gas-forming organism. In addition, there is a rim enhancing low-density area within the right psoas muscle measuring 3.2 x 2.6 cm on axial image 13. On coronal image 76, this extends off the superior aspect of the study. The visualized portion has a craniocaudal length of 5.4 cm. IMPRESSION: Gas within the soft tissue surrounding the lower sacrum and coccyx concerning for infection with gas-forming organism. No visible drainable fluid collection. Evaluation of this area is limited due to beam hardening artifact from bilateral hip replacements. Rim enhancing low-density area within the right psoas muscle which concerning for intramuscular abscess. Full extent extends above the 1st image for this CT pelvis. Electronically Signed   By: Rolm Baptise M.D.   On: 08/20/2019 21:45    Review of Systems  Unable to perform ROS: Mental status change   Blood pressure 110/72, pulse (!) 104, temperature (!) 100.7 F (38.2 C), temperature source Rectal, resp. rate 11, height 6\' 1"  (1.854 m), weight 108.9 kg, SpO2 98 %. Physical Exam  Constitutional: He is oriented to person, place, and time. He appears well-developed and well-nourished.  Eyes: Pupils are equal, round, and reactive to light. No scleral icterus.  Cardiovascular: Regular rhythm. Tachycardia present.  Respiratory: Effort normal and breath sounds normal.  GI: Soft. There is no abdominal tenderness.  Genitourinary:    Genitourinary Comments: Midline  wound for several cms and right buttock wound near it about 2 cm with small skin bridge surrounding cellulitis and pressure injury, I am able to probe the wound widely and expressed a lot of purulence out of the right buttock and the smaller wound. I broke up a lot of loculations as well.    Musculoskeletal:     Cervical back: Neck supple.  Neurological: He is alert and oriented to person, place, and time.  Skin: Skin is warm and dry.    Assessment/Plan: Sacral pressure ulcer with superinfection -medicine to see for admission -recommend discussion with IR again about psoas abscess -recommend broad spectrum abx - will just cover wound for now as is draining widely, will reevaluate in am, will leave npo -at some point this may need to be debrided in OR but I think with my exam today I was able to drain a lot of infection that was still present.  -needs covid testing tonight  Rolm Bookbinder 08/20/2019, 10:36 PM

## 2019-08-20 NOTE — H&P (Signed)
History and Physical    [name]:[id number] DOB: 06-10-1966 DOA: 08/20/2019  PCP: Lesleigh Noe, MD Patient coming from: Home  Chief Complaint: Drainage from buttock wound  HPI: [name] is a 54 y.o. male presenting with complaints of drainage from right buttock wound. Patient had a recent hospital admission 12/29-1/6 for lumbar epidural abscess, cauda equina syndrome, severe spinal stenosis L3-4 L4-5 status post decompressive lumbar laminectomy 3-4,4-5 for evacuation of epidural abscess on 12/30 by Dr. Saintclair Halsted and CT aspiration of right psoas abscess per interventional radiology on 12/30.  Found to have MSSA bacteremia and started on nafcillin x6 weeks vi PICC through 2/9 per infectious disease recommendation.  Echocardiogram without vegetation.  He was in inpatient rehab since his recent hospitalization and was discharged on 1/15.  Patient states a few days after being discharged from rehab he started noticing pain and drainage from his buttock area.  The pain is now so severe that he is barely able to move in his bed.  He has not had fevers or chills at home.  No cough, shortness of breath, abdominal pain, or other complaints.  Denies saddle anesthesia or urinary/fecal incontinence.  Denies urinary retention.  EMS reported fever of 103 F and was given Tylenol prior to ED arrival.  Tachycardic with heart rate in the 120s.  Blood pressure 130/60.  ED Course: Febrile with T-max 100.7 F.  Tachycardic with heart rate in the 110s.  Not hypotensive.  Not tachypneic or hypoxic.  WBC count 14.8.  Lactic acid 3.2.  Sodium 131, mildly low on previous labs as well.  UA and urine culture pending.  Blood culture x2 pending.  CT of pelvis showing gas within the soft tissue surrounding the lower sacrum and coccyx concerning for infection with gas-forming organism. No visible drainable fluid collection. Rim enhancing low-density area within the right psoas muscle concerning for intramuscular abscess.  Patient received vancomycin, Zosyn, and IV fluid boluses per sepsis protocol.  General surgery consulted.  Review of Systems:  All systems reviewed and apart from history of presenting illness, are negative.  Past Medical History:  Diagnosis Date  . History of chicken pox   . Medical history non-contributory     Past Surgical History:  Procedure Laterality Date  . CARPAL TUNNEL RELEASE Bilateral 2009  . COLONOSCOPY W/ POLYPECTOMY    . JOINT REPLACEMENT    . LACERATION REPAIR Right ~ 2012   leg  . LUMBAR LAMINECTOMY/DECOMPRESSION MICRODISCECTOMY N/A 07/24/2019   Procedure: Decompressive Lumbar Laminectomy Lumbar three-four Lumbar four-five for Epidural Abscess;  Surgeon: Kary Kos, MD;  Location: [facility];  Service: Neurosurgery;  Laterality: N/A;  . TOTAL HIP ARTHROPLASTY Right 01/11/2013   Procedure: TOTAL HIP ARTHROPLASTY;  Surgeon: Garald Balding, MD;  Location: [facility];  Service: Orthopedics;  Laterality: Right;  . TOTAL HIP ARTHROPLASTY Left 10/12/2016   Procedure: TOTAL HIP ARTHROPLASTY;  Surgeon: Garald Balding, MD;  Location: [facility];  Service: Orthopedics;  Laterality: Left;     reports that he quit smoking about 20 years ago. His smoking use included cigarettes. He has a 14.00 pack-year smoking history. He has never used smokeless tobacco. He reports current alcohol use of about 24.0 standard drinks of alcohol per week. He reports that he does not use drugs.  Allergies  Allergen Reactions  . Bee Venom Anaphylaxis  . Hydrocodone Nausea And Vomiting    Family History  Problem Relation Age of Onset  . Cancer Father  Hodgkin's disease  . COPD Mother   . Heart attack Maternal Grandfather 80  . Diabetes Neg Hx   . Stroke Neg Hx   . Hypertension Neg Hx   . Hyperlipidemia Neg Hx     Prior to Admission medications   Medication Sig Start Date End Date Taking? Authorizing Provider  acetaminophen (TYLENOL) 325 MG tablet Take 2 tablets (650 mg total) by mouth every 4  (four) hours as needed for mild pain ((score 1 to 3) or temp > 100.5). 08/09/19  Yes Angiulli, Lavon Paganini, PA-C  cyclobenzaprine (FLEXERIL) 10 MG tablet Take 1 tablet (10 mg total) by mouth 3 (three) times daily as needed for muscle spasms. 08/09/19  Yes Angiulli, Lavon Paganini, PA-C  nafcillin IVPB Inject 12 g into the vein daily. As a continuous infusion Indication:  Disseminated MSSA infection Last Day of Therapy:  09/04/19 Labs - Once weekly:  CBC/D and BMP, Labs - Every other week:  ESR and CRP 08/07/19 09/11/19 Yes Angiulli, Lavon Paganini, PA-C  Oxycodone HCl 10 MG TABS Take 1 tablet (10 mg total) by mouth every 4 (four) hours as needed ((score 7 to 10)). Patient taking differently: Take 10 mg by mouth every 4 (four) hours as needed (For pain).  08/20/19  Yes Lovorn, Jinny Blossom, MD  bisacodyl (DULCOLAX) 10 MG suppository Place 1 suppository (10 mg total) rectally daily as needed for moderate constipation. Patient not taking: Reported on 08/20/2019 08/09/19   Angiulli, Lavon Paganini, PA-C  Multiple Vitamin (MULTIVITAMIN WITH MINERALS) TABS tablet Take 1 tablet by mouth daily. Patient not taking: Reported on 08/20/2019 08/10/19   Angiulli, Lavon Paganini, PA-C  pantoprazole (PROTONIX) 40 MG tablet Take 1 tablet (40 mg total) by mouth at bedtime. Patient not taking: Reported on 08/20/2019 08/09/19   Angiulli, Lavon Paganini, PA-C  polyethylene glycol (MIRALAX / GLYCOLAX) 17 g packet Take 17 g by mouth daily as needed for mild constipation. Patient not taking: Reported on 08/20/2019 08/09/19   Angiulli, Lavon Paganini, PA-C  predniSONE (DELTASONE) 5 MG tablet Take 1 tablet (5 mg total) by mouth daily with breakfast. Patient not taking: Reported on 08/20/2019 08/10/19   Angiulli, Lavon Paganini, PA-C  senna (SENOKOT) 8.6 MG TABS tablet Take 2 tablets (17.2 mg total) by mouth daily. Patient not taking: Reported on 08/20/2019 08/10/19   Angiulli, Lavon Paganini, PA-C  sodium chloride 0.9 % SOLN 452 mL with nafcillin 2 g SOLR 12 g Inject 12 g into the vein  daily. Patient not taking: Reported on 08/20/2019 08/09/19   Angiulli, Lavon Paganini, PA-C  tamsulosin (FLOMAX) 0.4 MG CAPS capsule Take 1 capsule (0.4 mg total) by mouth daily after supper. Patient not taking: Reported on 08/20/2019 08/09/19   Angiulli, Lavon Paganini, PA-C  traZODone (DESYREL) 50 MG tablet Take 1 tablet (50 mg total) by mouth at bedtime as needed for sleep. Patient not taking: Reported on 08/20/2019 08/09/19   Cathlyn Parsons, PA-C    Physical Exam: Vitals:   08/20/19 2111 08/20/19 2215 08/20/19 2230 08/20/19 2245  BP:  115/63 123/69 111/67  Pulse:  89 95 94  Resp:  (!) 21 (!) 27 18  Temp:      TempSrc:      SpO2:  97% 100% 98%  Weight: 108.9 kg     Height: '6\' 1"'  (1.854 m)       Physical Exam  Constitutional: He is oriented to person, place, and time. He appears well-developed and well-nourished. No distress.  HENT:  Head: Normocephalic.  Eyes:  Right eye exhibits no discharge. Left eye exhibits no discharge.  Cardiovascular: Normal rate, regular rhythm and intact distal pulses.  Pulmonary/Chest: Effort normal and breath sounds normal. No respiratory distress. He has no wheezes. He has no rales.  Abdominal: Soft. Bowel sounds are normal. There is no abdominal tenderness. There is no guarding.  Musculoskeletal:        General: No edema.     Cervical back: Neck supple.  Neurological: He is alert and oriented to person, place, and time.  Skin: Skin is warm and dry. He is not diaphoretic.  Buttock wounds noted in the midline region and a few centimeters to the right with signs of cellulitis, pressure injury, and copious mucopurulent drainage.           Labs on Admission: I have personally reviewed following labs and imaging studies  CBC: Recent Labs  Lab 08/20/19 2016  WBC 14.8*  NEUTROABS 12.8*  HGB 10.7*  HCT 32.4*  MCV 93.4  PLT 254*   Basic Metabolic Panel: Recent Labs  Lab 08/20/19 2016  NA 131*  K 3.6  CL 96*  CO2 22  GLUCOSE 139*  BUN 9   CREATININE 0.93  CALCIUM 8.8*   GFR: Estimated Creatinine Clearance: 117.5 mL/min (by C-G formula based on SCr of 0.93 mg/dL). Liver Function Tests: Recent Labs  Lab 08/20/19 2016  AST 19  ALT 17  ALKPHOS 61  BILITOT 1.2  PROT 7.6  ALBUMIN 2.1*   No results for input(s): LIPASE, AMYLASE in the last 168 hours. No results for input(s): AMMONIA in the last 168 hours. Coagulation Profile: Recent Labs  Lab 08/20/19 2057  INR 1.5*   Cardiac Enzymes: No results for input(s): CKTOTAL, CKMB, CKMBINDEX, TROPONINI in the last 168 hours. BNP (last 3 results) No results for input(s): PROBNP in the last 8760 hours. HbA1C: No results for input(s): HGBA1C in the last 72 hours. CBG: No results for input(s): GLUCAP in the last 168 hours. Lipid Profile: No results for input(s): CHOL, HDL, LDLCALC, TRIG, CHOLHDL, LDLDIRECT in the last 72 hours. Thyroid Function Tests: No results for input(s): TSH, T4TOTAL, FREET4, T3FREE, THYROIDAB in the last 72 hours. Anemia Panel: No results for input(s): VITAMINB12, FOLATE, FERRITIN, TIBC, IRON, RETICCTPCT in the last 72 hours. Urine analysis:    Component Value Date/Time   COLORURINE YELLOW 07/24/2019 1456   APPEARANCEUR HAZY (A) 07/24/2019 1456   LABSPEC 1.017 07/24/2019 1456   PHURINE 6.0 07/24/2019 1456   GLUCOSEU NEGATIVE 07/24/2019 1456   HGBUR MODERATE (A) 07/24/2019 1456   BILIRUBINUR NEGATIVE 07/24/2019 1456   KETONESUR NEGATIVE 07/24/2019 1456   PROTEINUR NEGATIVE 07/24/2019 1456   UROBILINOGEN 0.2 01/09/2013 1046   NITRITE POSITIVE (A) 07/24/2019 1456   LEUKOCYTESUR TRACE (A) 07/24/2019 1456    Radiological Exams on Admission: CT PELVIS W CONTRAST  Result Date: 08/20/2019 CLINICAL DATA:  Right buttock abscess EXAM: CT PELVIS WITH CONTRAST TECHNIQUE: Multidetector CT imaging of the pelvis was performed using the standard protocol following the bolus administration of intravenous contrast. CONTRAST:  14m OMNIPAQUE IOHEXOL 300  MG/ML  SOLN COMPARISON:  07/19/2019 FINDINGS: Urinary Tract:  No abnormality visualized. Bowel:  Unremarkable visualized pelvic bowel loops. Vascular/Lymphatic: Atherosclerosis.  No aneurysm or adenopathy. Reproductive: Poorly visualized due to beam hardening artifact from bilateral hip replacements. Other:  No free fluid or free air. Musculoskeletal: Image quality in the pelvis is degraded due to extensive beam hardening artifact from bilateral hip replacements. There is gas noted throughout the soft tissues surrounding  the lower sacrum and coccyx. No visible drainable fluid collection, but the gas throughout the soft tissues is compatible with infection, possibly with gas-forming organism. In addition, there is a rim enhancing low-density area within the right psoas muscle measuring 3.2 x 2.6 cm on axial image 13. On coronal image 76, this extends off the superior aspect of the study. The visualized portion has a craniocaudal length of 5.4 cm. IMPRESSION: Gas within the soft tissue surrounding the lower sacrum and coccyx concerning for infection with gas-forming organism. No visible drainable fluid collection. Evaluation of this area is limited due to beam hardening artifact from bilateral hip replacements. Rim enhancing low-density area within the right psoas muscle which concerning for intramuscular abscess. Full extent extends above the 1st image for this CT pelvis. Electronically Signed   By: Rolm Baptise M.D.   On: 08/20/2019 21:45    EKG: Independently reviewed.  Sinus tachycardia, heart rate 103.  Rate increased since prior tracing.  Assessment/Plan Principal Problem:   Infected pressure ulcer Active Problems:   Sepsis ([facility])   Psoas abscess, right ([facility])   Hyponatremia   Alcohol use   Sepsis secondary to sacral pressure ulcer with superinfection and concern for right psoas intramuscular abscess Febrile tachycardic on arrival.  Labs with evidence of leukocytosis and lactic acidosis. CT of  pelvis showing gas within the soft tissue surrounding the lower sacrum and coccyx concerning for infection with gas-forming organism. No visible drainable fluid collection. Rim enhancing low-density area within the right psoas muscle concerning for intramuscular abscess. -Received IV fluid boluses per sepsis protocol.  Tachycardia has now resolved.  Continue IV fluid hydration. -Continue broad-spectrum antibiotics including vancomycin and Zosyn -Morphine as needed for pain -Wound care consult -Blood culture x2 pending -Continue to monitor WBC count -Trend lactate -Patient was seen by general surgery.  Recommending continuing broad-spectrum antibiotics and IR consultation for management of psoas abscess.  May need debridement in the OR.  Keep n.p.o. -Discussed with Dr. Annamaria Boots with IR, plan for CT-guided aspiration of psoas abscess in the morning. -Covid screening test ordered.  Mild hyponatremia Sodium 131, mildly low on previous labs as well.    -Received IV fluid -Repeat labs in a.m.  Recent cauda equina syndrome/neurogenic bladder Placed on Flomax during recent hospitalization for neurogenic bladder.  Patient denies urinary retention at this time. -Continue Flomax -Bladder scans every 4 hours  Alcohol use disorder Patient states he used to drink alcohol regularly but has not consumed alcohol since his recent hospitalization.  Currently not displaying any signs of withdrawal. -CIWA protocol; Ativan if needed -Thiamine, folate, multivitamin  DVT prophylaxis: SCDs at this time in anticipation of procedure in the morning Code Status: Full code Family Communication: No family available at this time. Disposition Plan: Anticipate discharge after clinical improvement. Consults called: General surgery, interventional radiology Admission status: It is my clinical opinion that admission to [facility] is reasonable and necessary in this 54 y.o. male presenting with   . Sepsis secondary to  sacral pressure ulcer with superinfection and concern for right psoas intramuscular abscess.  On broad-spectrum antibiotics.  Needs CT-guided drainage of psoas abscess and possible surgical debridement in the OR.  High risk of decompensation.  Given the aforementioned, the predictability of an adverse outcome is felt to be significant. I expect that the patient will require at least 2 midnights in the hospital to treat this condition.   The medical decision making on this patient was of high complexity and the patient is at high  risk for clinical deterioration, therefore this is a level 3 visit.  Shela Leff MD [facility]  If 7PM-7AM, please contact night-coverage [email or web address] Password [facility]  08/20/2019, 11:53 PM

## 2019-08-20 NOTE — ED Notes (Signed)
Patient daughter Ander Purpura asking for a call back [phone] would like an update  Please call

## 2019-08-21 ENCOUNTER — Inpatient Hospital Stay (HOSPITAL_COMMUNITY): Payer: BC Managed Care – PPO

## 2019-08-21 ENCOUNTER — Ambulatory Visit: Payer: BC Managed Care – PPO | Admitting: Family Medicine

## 2019-08-21 DIAGNOSIS — A419 Sepsis, unspecified organism: Principal | ICD-10-CM

## 2019-08-21 DIAGNOSIS — L089 Local infection of the skin and subcutaneous tissue, unspecified: Secondary | ICD-10-CM | POA: Diagnosis not present

## 2019-08-21 DIAGNOSIS — E871 Hypo-osmolality and hyponatremia: Secondary | ICD-10-CM | POA: Diagnosis not present

## 2019-08-21 DIAGNOSIS — Z452 Encounter for adjustment and management of vascular access device: Secondary | ICD-10-CM | POA: Diagnosis not present

## 2019-08-21 DIAGNOSIS — L8990 Pressure ulcer of unspecified site, unspecified stage: Secondary | ICD-10-CM | POA: Diagnosis not present

## 2019-08-21 DIAGNOSIS — L89150 Pressure ulcer of sacral region, unstageable: Secondary | ICD-10-CM | POA: Diagnosis not present

## 2019-08-21 DIAGNOSIS — M4646 Discitis, unspecified, lumbar region: Secondary | ICD-10-CM | POA: Diagnosis not present

## 2019-08-21 DIAGNOSIS — G061 Intraspinal abscess and granuloma: Secondary | ICD-10-CM | POA: Diagnosis not present

## 2019-08-21 DIAGNOSIS — L89159 Pressure ulcer of sacral region, unspecified stage: Secondary | ICD-10-CM | POA: Diagnosis not present

## 2019-08-21 DIAGNOSIS — T8189XA Other complications of procedures, not elsewhere classified, initial encounter: Secondary | ICD-10-CM | POA: Diagnosis not present

## 2019-08-21 DIAGNOSIS — B9561 Methicillin susceptible Staphylococcus aureus infection as the cause of diseases classified elsewhere: Secondary | ICD-10-CM | POA: Diagnosis not present

## 2019-08-21 DIAGNOSIS — Z7289 Other problems related to lifestyle: Secondary | ICD-10-CM | POA: Diagnosis not present

## 2019-08-21 DIAGNOSIS — M4626 Osteomyelitis of vertebra, lumbar region: Secondary | ICD-10-CM | POA: Diagnosis not present

## 2019-08-21 DIAGNOSIS — L89304 Pressure ulcer of unspecified buttock, stage 4: Secondary | ICD-10-CM | POA: Diagnosis not present

## 2019-08-21 DIAGNOSIS — R7881 Bacteremia: Secondary | ICD-10-CM | POA: Diagnosis not present

## 2019-08-21 DIAGNOSIS — K6812 Psoas muscle abscess: Secondary | ICD-10-CM | POA: Diagnosis not present

## 2019-08-21 LAB — BASIC METABOLIC PANEL
Anion gap: 10 (ref 5–15)
BUN: 7 mg/dL (ref 6–20)
CO2: 24 mmol/L (ref 22–32)
Calcium: 8.5 mg/dL — ABNORMAL LOW (ref 8.9–10.3)
Chloride: 101 mmol/L (ref 98–111)
Creatinine, Ser: 0.76 mg/dL (ref 0.61–1.24)
GFR calc Af Amer: >60 mL/min (ref >60)
GFR calc non Af Amer: >60 mL/min (ref >60)
Glucose, Bld: 110 mg/dL — ABNORMAL HIGH (ref 70–99)
Potassium: 3.6 mmol/L (ref 3.5–5.1)
Sodium: 135 mmol/L (ref 135–145)

## 2019-08-21 LAB — CREATININE, SERUM
Creatinine, Ser: 0.84 mg/dL (ref 0.61–1.24)
GFR calc Af Amer: >60 mL/min (ref >60)
GFR calc non Af Amer: >60 mL/min (ref >60)

## 2019-08-21 LAB — CBC
HCT: 28.6 % — ABNORMAL LOW (ref 39.0–52.0)
HCT: 28.5 % — ABNORMAL LOW (ref 39.0–52.0)
Hemoglobin: 9.2 g/dL — ABNORMAL LOW (ref 13.0–17.0)
Hemoglobin: 9.3 g/dL — ABNORMAL LOW (ref 13.0–17.0)
MCH: 30.2 pg (ref 26.0–34.0)
MCH: 31.1 pg (ref 26.0–34.0)
MCHC: 32.2 g/dL (ref 30.0–36.0)
MCHC: 32.6 g/dL (ref 30.0–36.0)
MCV: 93.8 fL (ref 80.0–100.0)
MCV: 95.3 fL (ref 80.0–100.0)
Platelets: 435 10*3/uL — ABNORMAL HIGH (ref 150–400)
Platelets: 458 10*3/uL — ABNORMAL HIGH (ref 150–400)
RBC: 3.05 MIL/uL — ABNORMAL LOW (ref 4.22–5.81)
RBC: 2.99 MIL/uL — ABNORMAL LOW (ref 4.22–5.81)
RDW: 13.9 % (ref 11.5–15.5)
RDW: 14 % (ref 11.5–15.5)
WBC: 11.6 10*3/uL — ABNORMAL HIGH (ref 4.0–10.5)
WBC: 11.3 10*3/uL — ABNORMAL HIGH (ref 4.0–10.5)
nRBC: 0 % (ref 0.0–0.2)
nRBC: 0 % (ref 0.0–0.2)

## 2019-08-21 LAB — COMPREHENSIVE METABOLIC PANEL
ALT: 13 U/L (ref 0–44)
AST: 13 U/L — ABNORMAL LOW (ref 15–41)
Albumin: 1.8 g/dL — ABNORMAL LOW (ref 3.5–5.0)
Alkaline Phosphatase: 51 U/L (ref 38–126)
Anion gap: 11 (ref 5–15)
BUN: 7 mg/dL (ref 6–20)
CO2: 22 mmol/L (ref 22–32)
Calcium: 8.4 mg/dL — ABNORMAL LOW (ref 8.9–10.3)
Chloride: 103 mmol/L (ref 98–111)
Creatinine, Ser: 0.8 mg/dL (ref 0.61–1.24)
GFR calc Af Amer: >60 mL/min (ref >60)
GFR calc non Af Amer: >60 mL/min (ref >60)
Glucose, Bld: 106 mg/dL — ABNORMAL HIGH (ref 70–99)
Potassium: 3.9 mmol/L (ref 3.5–5.1)
Sodium: 136 mmol/L (ref 135–145)
Total Bilirubin: 1 mg/dL (ref 0.3–1.2)
Total Protein: 6.4 g/dL — ABNORMAL LOW (ref 6.5–8.1)

## 2019-08-21 LAB — MRSA PCR SCREENING: MRSA by PCR: NEGATIVE

## 2019-08-21 LAB — RESPIRATORY PANEL BY RT PCR (FLU A&B, COVID)
Influenza A by PCR: NEGATIVE
Influenza B by PCR: NEGATIVE
SARS Coronavirus 2 by RT PCR: NEGATIVE

## 2019-08-21 LAB — MAGNESIUM: Magnesium: 1.8 mg/dL (ref 1.7–2.4)

## 2019-08-21 LAB — PHOSPHORUS: Phosphorus: 4.3 mg/dL (ref 2.5–4.6)

## 2019-08-21 IMAGING — DX DG CHEST 1V PORT
1 series · 1 of 1 positions shown · non-contrast
Comparison: [DATE]

CLINICAL DATA: PICC placement

EXAM:
PORTABLE CHEST 1 VIEW

[chest]
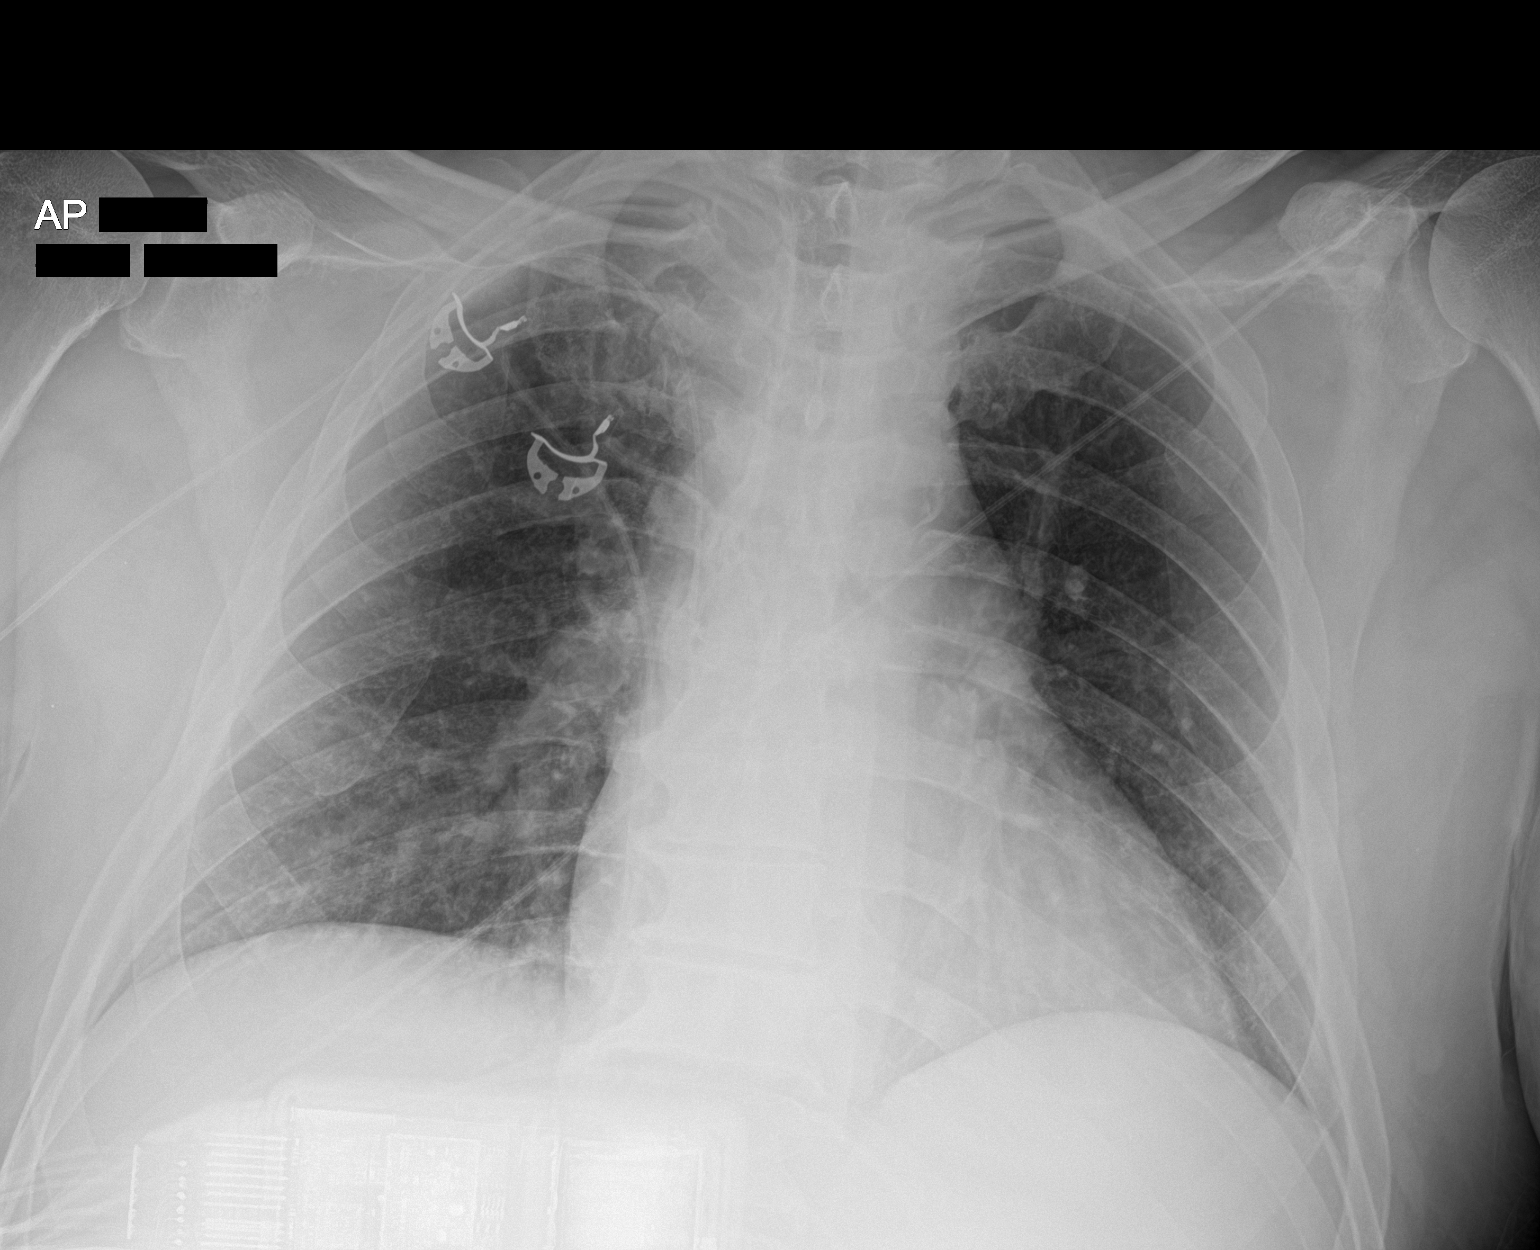

[1 of 1 positions shown; findings below may reference images not displayed]

FINDINGS: Right arm PICC tip in the mid SVC.

Lungs are clear without infiltrate effusion or edema. Cardiac
enlargement.
IMPRESSION: PICC tip in the mid SVC.

## 2019-08-21 IMAGING — CT CT IMAGE GUIDED FLUID DRAIN BY CATHETER
1 of 3 series · 13 of 32 positions shown, 18 images · non-contrast
Comparison: none

INDICATION: 53-year-old male with a persistent right psoas abscess despite prior
CT-guided aspiration on [DATE]. He presents for CT-guided drain
placement.

[Series 2: i-spiral 5.0 b40f · axial · 0.83mm/px · z∈[+1096,+1344]mm · 13 of 83 slices shown, 18 images]
[im 6/83  soft-tissue]
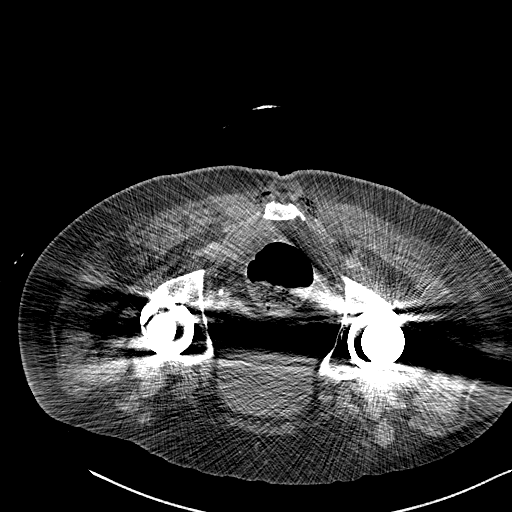
[im 6/83  bone]
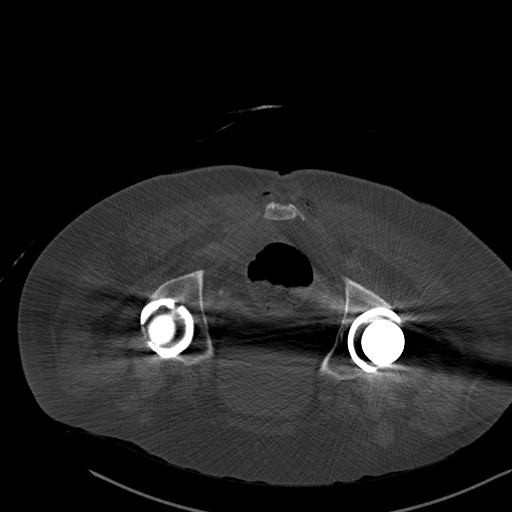
[im 11/83  soft-tissue]
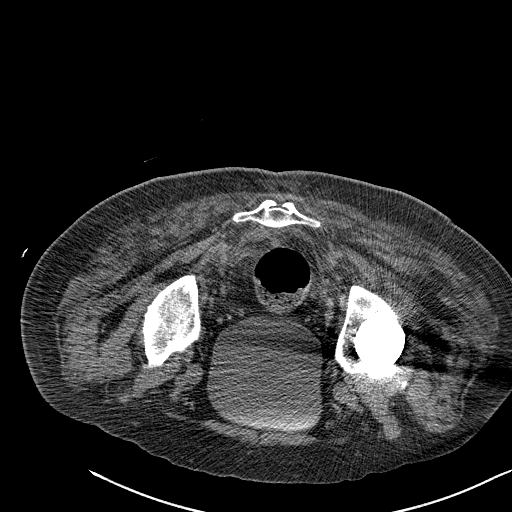
[im 21/83  soft-tissue]
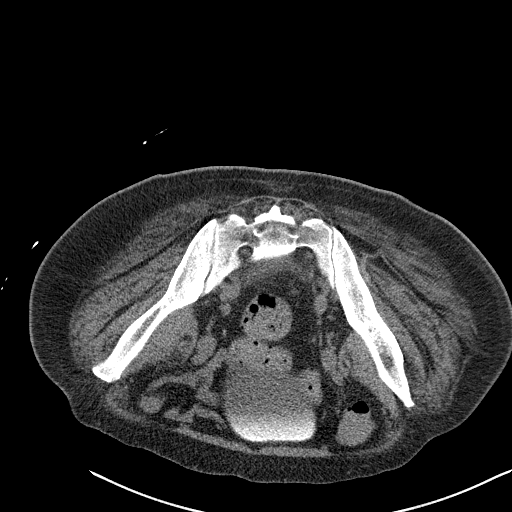
[im 26/83  soft-tissue]
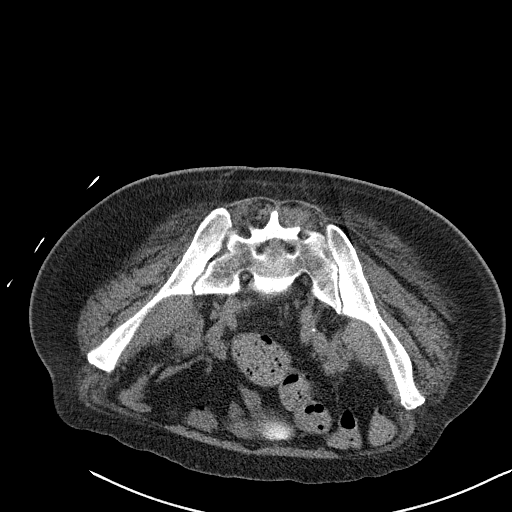
[im 31/83  soft-tissue]
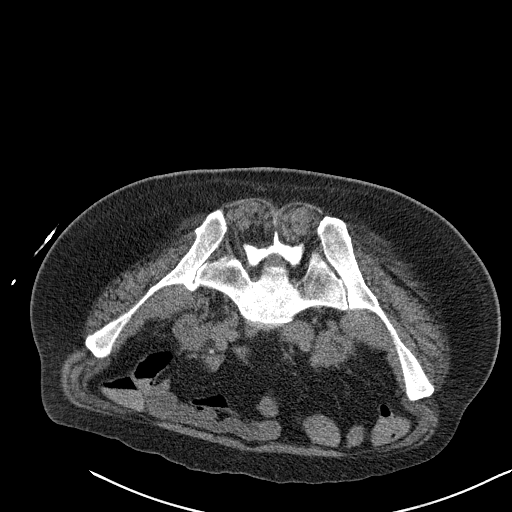
[im 36/83  soft-tissue]
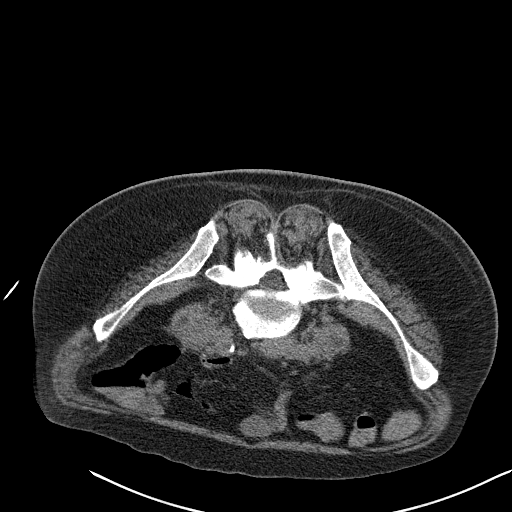
[im 47/83  soft-tissue]
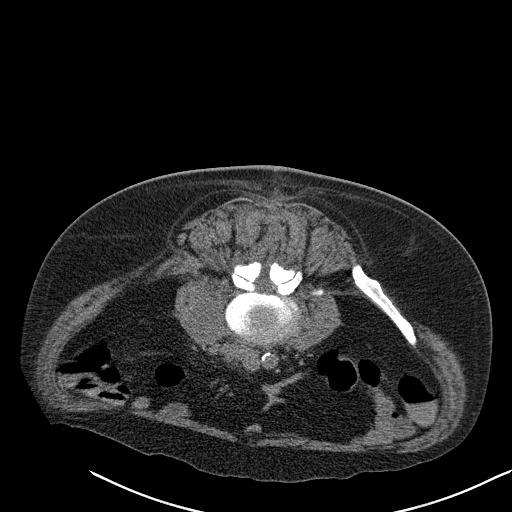
[im 52/83  soft-tissue]
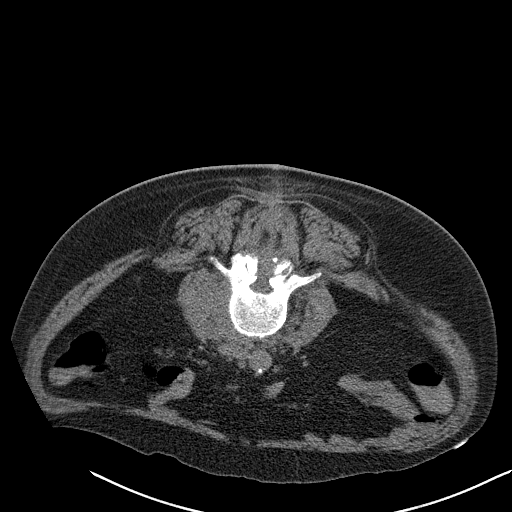
[im 57/83  soft-tissue]
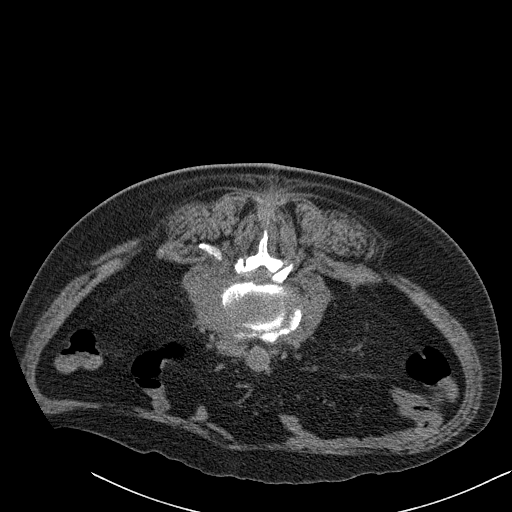
[im 57/83  bone]
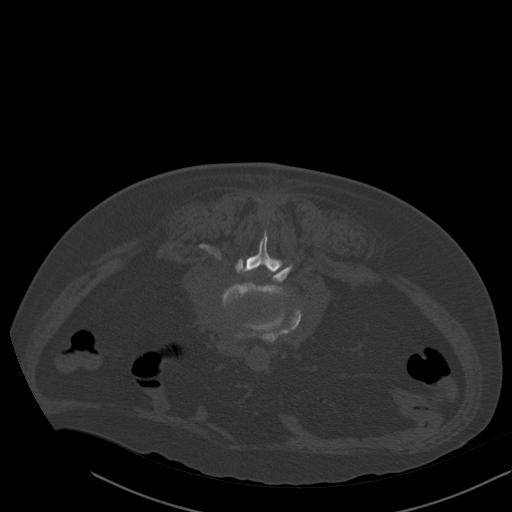
[im 62/83  soft-tissue]
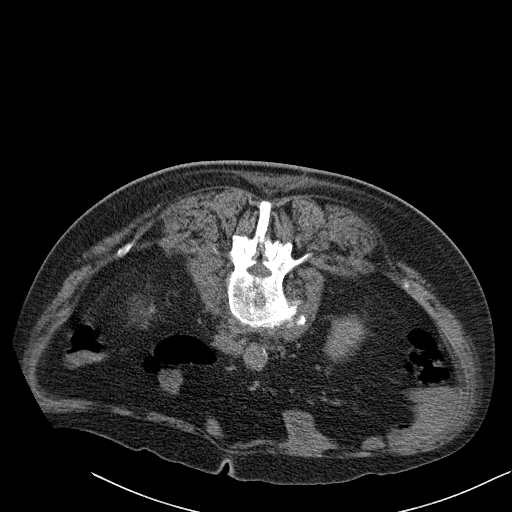
[im 62/83  lung]
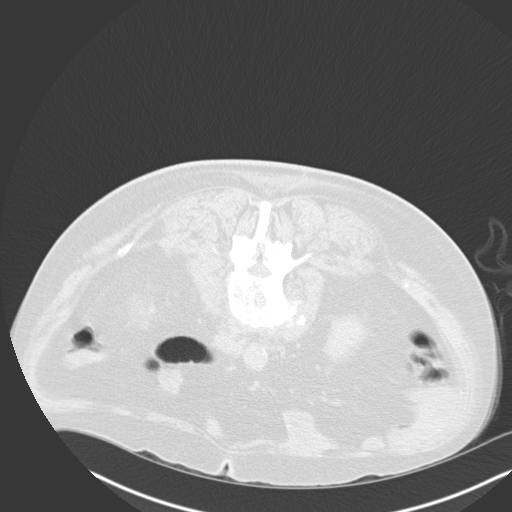
[im 67/83  lung]
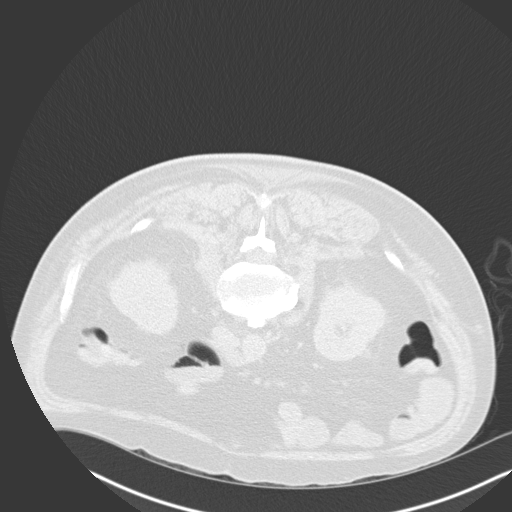
[im 72/83  soft-tissue]
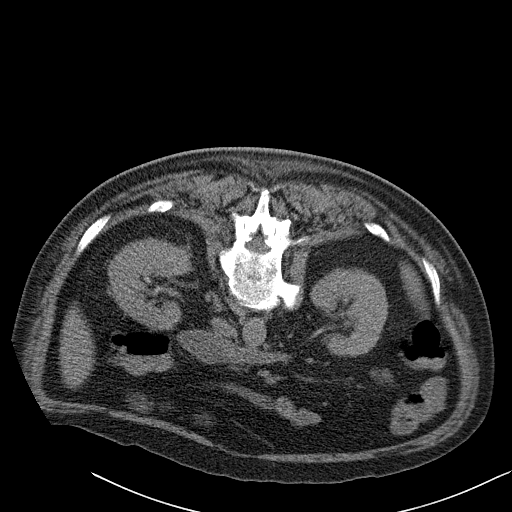
[im 72/83  lung]
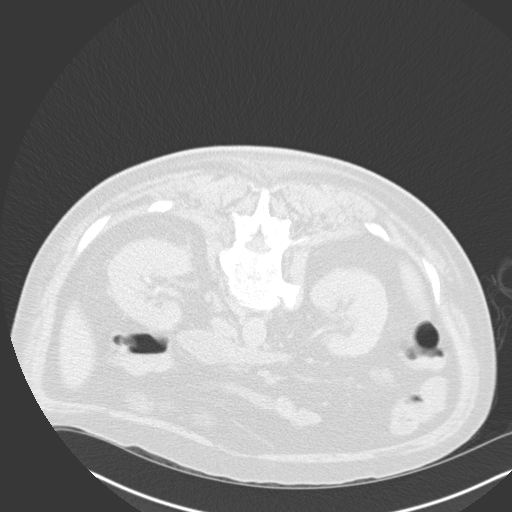
[im 77/83  soft-tissue]
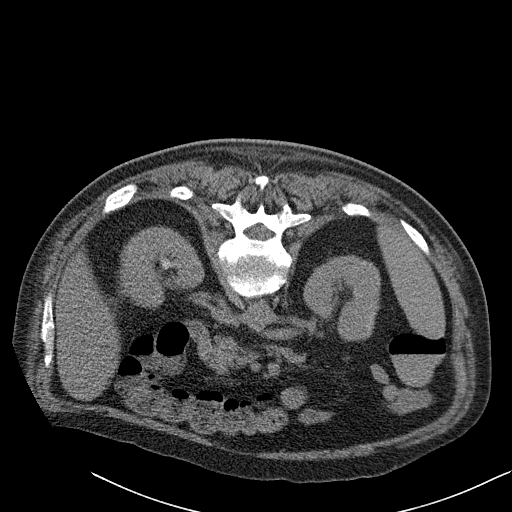
[im 77/83  lung]
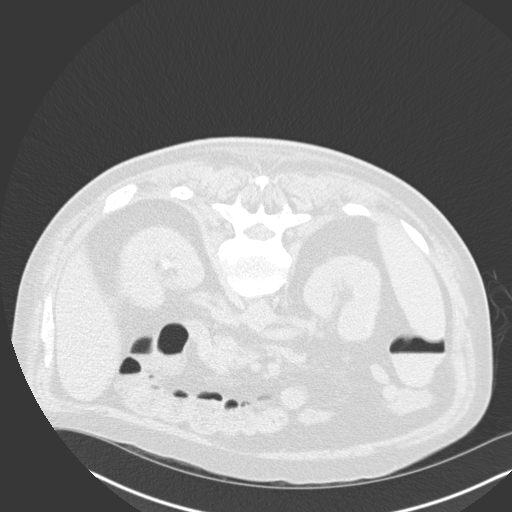

[13 of 32 positions shown; findings below may reference images not displayed]

EXAM:
CT-guided drain placement

MEDICATIONS:
The patient is currently admitted to the hospital and receiving
intravenous antibiotics. The antibiotics were administered within an
appropriate time frame prior to the initiation of the procedure.

ANESTHESIA/SEDATION:
Fentanyl 100 mcg IV; Versed 2 mg IV

Moderate Sedation Time:  20 minutes

The patient was continuously monitored during the procedure by the
interventional radiology nurse under my direct supervision.

COMPLICATIONS:
None immediate.

PROCEDURE:
Informed written consent was obtained from the patient after a
thorough discussion of the procedural risks, benefits and
alternatives. All questions were addressed. Maximal Sterile Barrier
Technique was utilized including caps, mask, sterile gowns, sterile
gloves, sterile drape, hand hygiene and skin antiseptic. A timeout
was performed prior to the initiation of the procedure.

The patient was placed prone on the gantry table. A limited axial CT
scan was performed. The low-attenuation collection in the right
psoas muscle was successfully identified. A suitable skin entry site
was selected and marked. The overlying skin was sterilely prepped
and draped in the standard fashion using chlorhexidine skin prep.
Local anesthesia was attained by infiltration with 1% lidocaine. A
small dermatotomy was made. Under intermittent CT guidance, an 18
gauge trocar needle was carefully advanced along a right paraspinal
approach into the fluid collection. A 0.035 wire was then coiled in
the fluid collection. The tract was dilated to 10 French and SIFONIER
10 French all-purpose drainage catheter was advanced over the wire
and formed within the fluid collection. Aspiration yields 25 mL
turbid yellow fluid. Samples were sent for culture. The drainage
catheter was secured to the skin with 0 Prolene suture and connected
to JP bulb suction.

Post drain placement CT imaging demonstrates a well-positioned
drainage catheter with significant reduction in the volume of the
intra psoas fluid collection.
IMPRESSION: Successful placement of a 12 French drainage catheter into the right
psoas fluid collection with aspiration of 25 mL turbid yellow fluid.
Samples were sent for Gram stain and culture.

PLAN:
1. Maintain drain to JP bulb suction.
2. Flush drain with 5-10 mL sterile saline or water once per shift.
3. Once drain output has been minimal (no more than the flush
volume) for 48 hours, the drainage catheter can be removed.

## 2019-08-21 MED ORDER — MORPHINE SULFATE (PF) 2 MG/ML IV SOLN
2.0000 mg | INTRAVENOUS | Status: DC | PRN
  Administered 2019-08-21 – 2019-08-26 (×19): 2 mg via INTRAVENOUS
  Filled 2019-08-21 (×20): qty 1

## 2019-08-21 MED ORDER — FENTANYL CITRATE (PF) 100 MCG/2ML IJ SOLN
INTRAMUSCULAR | Status: AC
  Filled 2019-08-21: qty 2

## 2019-08-21 MED ORDER — ENSURE ENLIVE PO LIQD: 237.0000 mL | Freq: Three times a day (TID) | ORAL | Status: DC

## 2019-08-21 MED ORDER — ENOXAPARIN SODIUM 40 MG/0.4ML ~~LOC~~ SOLN
40.0000 mg | SUBCUTANEOUS | Status: DC
  Administered 2019-08-21 – 2019-08-25 (×5): 40 mg via SUBCUTANEOUS
  Filled 2019-08-21 (×5): qty 0.4

## 2019-08-21 MED ORDER — MIDAZOLAM HCL 2 MG/2ML IJ SOLN
INTRAMUSCULAR | Status: AC
  Filled 2019-08-21: qty 2

## 2019-08-21 MED ORDER — MIDAZOLAM HCL 2 MG/2ML IJ SOLN
INTRAMUSCULAR | Status: AC | PRN
  Administered 2019-08-21: 0.5 mg via INTRAVENOUS
  Administered 2019-08-21: 1.5 mg via INTRAVENOUS

## 2019-08-21 MED ORDER — CHLORHEXIDINE GLUCONATE CLOTH 2 % EX PADS
6.0000 | MEDICATED_PAD | Freq: Every day | CUTANEOUS | Status: DC
  Administered 2019-08-21 – 2019-08-28 (×7): 6 via TOPICAL

## 2019-08-21 MED ORDER — CYCLOBENZAPRINE HCL 10 MG PO TABS
10.0000 mg | ORAL_TABLET | Freq: Three times a day (TID) | ORAL | Status: DC | PRN
  Administered 2019-08-21 – 2019-08-27 (×8): 10 mg via ORAL
  Filled 2019-08-21 (×8): qty 1

## 2019-08-21 MED ORDER — SODIUM CHLORIDE 0.9 % IV SOLN
INTRAVENOUS | Status: AC | PRN
  Administered 2019-08-21: 10 mL/h via INTRAVENOUS

## 2019-08-21 MED ORDER — SODIUM CHLORIDE 0.9% FLUSH
5.0000 mL | Freq: Three times a day (TID) | INTRAVENOUS | Status: DC
  Administered 2019-08-21 – 2019-08-27 (×16): 5 mL

## 2019-08-21 MED ORDER — BOOST / RESOURCE BREEZE PO LIQD CUSTOM
1.0000 | Freq: Three times a day (TID) | ORAL | Status: DC
  Administered 2019-08-22 – 2019-08-28 (×18): 1 via ORAL

## 2019-08-21 MED ORDER — FENTANYL CITRATE (PF) 100 MCG/2ML IJ SOLN
INTRAMUSCULAR | Status: AC | PRN
  Administered 2019-08-21 (×2): 50 ug via INTRAVENOUS

## 2019-08-21 NOTE — Telephone Encounter (Signed)
Sounds like a good idea- that doesn't sound like patient behavior I saw in hospital.

## 2019-08-21 NOTE — Telephone Encounter (Signed)
Called and spoke with daughter- documented in last note- pt back in hospital. Discussed he has new R psoas abscess- they are trying to drain it/aspirate it.  Explained she can put note on white board ot let staff to call her.

## 2019-08-21 NOTE — Progress Notes (Signed)
[facility] Progress Note     Subjective: CC-  Continues to have a lot of pain in sacral/gluteal wounds. On zosyn and vancomycin. WBC trending down 11.6, TMAX 100.7. Lactic acid normalized 1.  Objective: Vital signs in last 24 hours: Temp:  [97.5 F (36.4 C)-100.7 F (38.2 C)] 97.5 F (36.4 C) (01/26 0752) Pulse Rate:  [85-113] 88 (01/26 0752) Resp:  [11-27] 18 (01/26 0519) BP: (104-131)/(59-75) 112/66 (01/26 0752) SpO2:  [94 %-100 %] 96 % (01/26 0752) Weight:  [104.4 kg-108.9 kg] 104.4 kg (01/26 0115)    Intake/Output from previous day: 01/25 0701 - 01/26 0700 In: 451.1 [P.O.:30; I.V.:371.1; IV Piggyback:50] Out: 500 [Urine:500] Intake/Output this shift: No intake/output data recorded.  PE: Gen:  Alert, NAD, pleasant HEENT: EOM's intact, pupils equal and round Pulm:  Rate and effort normal Abd: Soft, NT/ND Skin: warm and dry GU: sacral and right gluteal wounds open and draining some pus, foul odor present, some surrounding erythema/cellulitis and redness just from pressure     Lab Results:  Recent Labs    08/20/19 2016 08/21/19 0714  WBC 14.8* 11.6*  HGB 10.7* 9.2*  HCT 32.4* 28.6*  PLT 512* 435*   BMET Recent Labs    08/20/19 2016 08/21/19 0714  NA 131* 135  K 3.6 3.6  CL 96* 101  CO2 22 24  GLUCOSE 139* 110*  BUN 9 7  CREATININE 0.93 0.76  CALCIUM 8.8* 8.5*   PT/INR Recent Labs    08/20/19 2057  LABPROT 18.2*  INR 1.5*   CMP     Component Value Date/Time   NA 135 08/21/2019 0714   K 3.6 08/21/2019 0714   CL 101 08/21/2019 0714   CO2 24 08/21/2019 0714   GLUCOSE 110 (H) 08/21/2019 0714   BUN 7 08/21/2019 0714   CREATININE 0.76 08/21/2019 0714   CALCIUM 8.5 (L) 08/21/2019 0714   PROT 7.6 08/20/2019 2016   ALBUMIN 2.1 (L) 08/20/2019 2016   AST 19 08/20/2019 2016   ALT 17 08/20/2019 2016   ALKPHOS 61 08/20/2019 2016   BILITOT 1.2 08/20/2019 2016   GFRNONAA >60 08/21/2019 0714   GFRAA >60 08/21/2019 0714   Lipase    No results found for: LIPASE     Studies/Results: CT PELVIS W CONTRAST  Result Date: 08/20/2019 CLINICAL DATA:  Right buttock abscess EXAM: CT PELVIS WITH CONTRAST TECHNIQUE: Multidetector CT imaging of the pelvis was performed using the standard protocol following the bolus administration of intravenous contrast. CONTRAST:  155mL OMNIPAQUE IOHEXOL 300 MG/ML  SOLN COMPARISON:  07/19/2019 FINDINGS: Urinary Tract:  No abnormality visualized. Bowel:  Unremarkable visualized pelvic bowel loops. Vascular/Lymphatic: Atherosclerosis.  No aneurysm or adenopathy. Reproductive: Poorly visualized due to beam hardening artifact from bilateral hip replacements. Other:  No free fluid or free air. Musculoskeletal: Image quality in the pelvis is degraded due to extensive beam hardening artifact from bilateral hip replacements. There is gas noted throughout the soft tissues surrounding the lower sacrum and coccyx. No visible drainable fluid collection, but the gas throughout the soft tissues is compatible with infection, possibly with gas-forming organism. In addition, there is a rim enhancing low-density area within the right psoas muscle measuring 3.2 x 2.6 cm on axial image 13. On coronal image 76, this extends off the superior aspect of the study. The visualized portion has a craniocaudal length of 5.4 cm. IMPRESSION: Gas within the soft tissue surrounding the lower sacrum and coccyx concerning for infection with gas-forming organism. No visible drainable  fluid collection. Evaluation of this area is limited due to beam hardening artifact from bilateral hip replacements. Rim enhancing low-density area within the right psoas muscle which concerning for intramuscular abscess. Full extent extends above the 1st image for this CT pelvis. Electronically Signed   By: Rolm Baptise M.D.   On: 08/20/2019 21:45    Anti-infectives: Anti-infectives (From admission, onward)   Start     Dose/Rate Route Frequency Ordered Stop    08/21/19 0900  vancomycin (VANCOREADY) IVPB 1250 mg/250 mL     1,250 mg 166.7 mL/hr over 90 Minutes Intravenous Every 12 hours 08/20/19 2114     08/21/19 0400  piperacillin-tazobactam (ZOSYN) IVPB 3.375 g     3.375 g 12.5 mL/hr over 240 Minutes Intravenous Every 8 hours 08/20/19 2314     08/20/19 2100  vancomycin (VANCOCIN) 2,500 mg in sodium chloride 0.9 % 500 mL IVPB     2,500 mg 250 mL/hr over 120 Minutes Intravenous  Once 08/20/19 2059 08/20/19 2321   08/20/19 2045  piperacillin-tazobactam (ZOSYN) IVPB 3.375 g     3.375 g 100 mL/hr over 30 Minutes Intravenous  Once 08/20/19 2037 08/20/19 2130       Assessment/Plan Lumbar epidural abscess cauda equina syndrome severe spinal stenosis L3-4 L4-5 s/p decompressive lumbar laminectomies L3-4 L4-5 with partial medial facetectomies and foraminotomies of the L3-L4 and L5 nerve roots 07/25/2019 Dr. Saintclair Halsted  MSSA bacteremia - ID following, was planning 6 weeks of IV nafcillin starting from 07/25/2019  Right psoas abscess - per IR, per notes they are planning aspiration today  Sepsis Sacral pressure ulcer with superinfection - Continue broad spectrum antibiotics. Increase dressing changes to TID today. Encourage shower with wound open. Will continue nonoperative management for now and see if this will clean up well with dressing changes. If not, we may consider taking him to the OR in the next day or so for I&D.  Ok for diet from surgical standpoint.  ID - zosyn/vancomycin 1/25>> FEN - IVF, NPO VTE - SCDs, per primary/ok for chemical DVT prophylaxis from surgical standpoint Foley - none Follow up - TBD   LOS: 1 day    Wellington Hampshire, [facility] Surgery 08/21/2019, 8:18 AM Please see Amion for pager number during day hours 7:00am-4:30pm

## 2019-08-21 NOTE — Progress Notes (Addendum)
Initial Nutrition Assessment  DOCUMENTATION CODES:   Obesity unspecified  INTERVENTION:   -MVI with minerals daily -D/c Ensure Enlive po BID, each supplement provides 350 kcal and 20 grams of protein -Boost Breeze po TID, each supplement provides 250 kcal and 9 grams of protein -Magic cup TID with meals, each supplement provides 290 kcal and 9 grams of protein  NUTRITION DIAGNOSIS:   Increased nutrient needs related to wound healing as evidenced by estimated needs.  GOAL:   Patient will meet greater than or equal to 90% of their needs  MONITOR:   PO intake, Supplement acceptance, Labs, Weight trends, Skin, I & O's  REASON FOR ASSESSMENT:   Malnutrition Screening Tool    ASSESSMENT:   [name] is a 54 y.o. male presenting with complaints of drainage from right buttock wound. Patient had a recent hospital admission 12/29-1/6 for lumbar epidural abscess, cauda equina syndrome, severe spinal stenosis L3-4 L4-5 status post decompressive lumbar laminectomy 3-4,4-5 for evacuation of epidural abscess on 12/30 by Dr. Saintclair Halsted and CT aspiration of right psoas abscess per interventional radiology on 12/30.  Found to have MSSA bacteremia and started on nafcillin x6 weeks vi PICC through 2/9 per infectious disease recommendation.  Echocardiogram without vegetation.  He was in inpatient rehab since his recent hospitalization and was discharged on 1/15.  Patient states a few days after being discharged from rehab he started noticing pain and drainage from his buttock area.  The pain is now so severe that he is barely able to move in his bed.  He has not had fevers or chills at home.  No cough, shortness of breath, abdominal pain, or other complaints.  Denies saddle anesthesia or urinary/fecal incontinence.  Denies urinary retention.  Pt admitted with sepsis secondary to sacral pressure ulcer with superinfection and concern for right psoas.   126- s/p placement of drain into rt psosas abscess  (25 ml turbid yellow fluid aspirated).   Reviewed I/O's: -49 ml x 24 hours  UOP: 500 ml x 24 hours  Per general surgery notes, recommending IR consult for psoas abscess.   Attempted to speak with pt x 2; pt working therapy on first attempt. Also attempted to speak with pt by phone on second attempt, however, pt did not answer. Per chart, review, pt with confusion.   Pt advanced to regular diet after IR procedure. No meal completion data avalable at this time.   Reviewed wt hx; noted pt has experienced a 13.6% wt loss over the past 6 months, which is significant for time frame.  Given weight loss, prior hospitalization, and multiple areas of skin breakdown, pt is at high risk for malnutrition, however, RD unable to assess at this time.   Addendum: Case discussed with RN, who reports pt is refusing Ensure, but likes [employer]. RD to addend order.   Medications reviewed and include thiamine, MVI, folic acid, zosyn, and 0.9% sodium chloride infusion @ 125 ml/hr.  Labs reviewed.   Diet Order:   Diet Order            Diet regular Room service appropriate? Yes; Fluid consistency: Thin  Diet effective now              EDUCATION NEEDS:   No education needs have been identified at this time  Skin:  Skin Integrity Issues:: Incisions, Stage II Stage II: rt upper buttocks, rt lower buttocks Incisions: back Other: rt buttocks laceration  Last BM:  08/09/19  Height:   Ht  Readings from Last 1 Encounters:  08/21/19 6\' 1"  (1.854 m)    Weight:   Wt Readings from Last 1 Encounters:  08/21/19 104.4 kg    Ideal Body Weight:  83.6 kg  BMI:  Body mass index is 30.37 kg/m.  Estimated Nutritional Needs:   Kcal:  2500-2700  Protein:  145-165 grams  Fluid:  > 2.5 L    Dariusz Brase A. Jimmye Norman, RD, LDN, [employer] Registered Dietitian II Certified Diabetes Care and Education Specialist Pager: [phone] After hours Pager: [phone]

## 2019-08-21 NOTE — Progress Notes (Addendum)
Dressing to buttocks came off during transfer from stretcher to bed. Copious drainage noted to dressing. Mid central buttocks noted to be very deep. This area was packed with saline soaked gauze. 2x2s and abd pads placed on top along with foam. Area to right buttock is actively draining. Very tender to touch. Entire buttocks is red, but blanchable. Patient is to be kept NPO for procedure later today. This is explained to the patient. He verbalizes understanding of this.   75- Paged [facility] about patient's request for flexeril for his "jumping legs" as he takes this meds at home. Awaiting call back or orders.   0311- Orders noted by [facility]  This note is placed because I am unable to place on avatar due to wounds/incisions prior to this admission that were documented before:  Two pressure ulcers noted to buttocks: Patient has a medial incision to lower back with steri strips   Open area noted to medial buttocks. Appears very deep with possible eschar, minimal drainage noted to this area. Unstageable? Open area also noted to right buttock. Actively draining with and without pressure around opening of wound. Also appears deep but not as deep as the medial buttock wound. Wound bed has yellow slough? Surrounding skin of both these wounds is blanchable redness.  Wet to dry dressing applied. Using saline soaked kerlix to pack wound covered with nonadherent pad, guaze squares, and a foam placed as well as a abd pad under foam pad to catch additional drainage.   [id number]- Consulted IV team for patient's single lumen picc line from home with continuous antibiotics infusing. According to documentation of previous hospital admission IV team drew labs from line.   82- Spoke with picc team about using single lumen picc line for blood draw. Paged MD about a chest xray for verification.  51- Spoke with lab to draw from patient, unable to draw from picc line right now. Lab to collect at this time.

## 2019-08-21 NOTE — Consult Note (Signed)
Chief Complaint: Patient was seen in consultation today for right psoas abscess aspiration/possible drain placement.   Referring Physician(s): Shela Leff  Supervising Physician: Jacqulynn Cadet  Patient Status: [facility] - In-pt  History of Present Illness: [name] is a 54 y.o. male with a past medical history significant for recent hospitalization (07/24/19 - 08/01/19) for lumbar epidural abscess, cauda equina syndrome, severe spinal stenosis at L3-5 s/p decompressive lumbar laminectomy and evacuation of epidural abscess on 07/25/19 and bacteremia requiring long term antibiotic therapy who presented to [facility] ED on 08/20/19 with complaints of severe pain and drainage from his buttock area. Initial work up significant for fever of 103, tachycardia, hypotension, WBC 14.8, lactic acid 3.2. CT pelvis with contrast notable for gas within the soft tissue surrounding the lower sacrum and coccyx and rim enhancing low-density area within the right psoas muscle concerning for intramuscular abscess. He was admitted to the hospitalist service for further evaluation and management. General surgery was consulted and Dr. Donne Hazel was able to break up loculations and express a significant amount of purulent material at bedside, they have also recommended IR consult for possible right psoas abscess aspiration/possible drain placement.   Patient seen in IR holding area - he reports severe pain to how lower back and buttocks but otherwise denies any complaints. He states understanding of the requested procedure and wishes to proceed.   Past Medical History:  Diagnosis Date  . History of chicken pox   . Medical history non-contributory     Past Surgical History:  Procedure Laterality Date  . CARPAL TUNNEL RELEASE Bilateral 2009  . COLONOSCOPY W/ POLYPECTOMY    . JOINT REPLACEMENT    . LACERATION REPAIR Right ~ 2012   leg  . LUMBAR LAMINECTOMY/DECOMPRESSION MICRODISCECTOMY N/A 07/24/2019   Procedure: Decompressive Lumbar Laminectomy Lumbar three-four Lumbar four-five for Epidural Abscess;  Surgeon: Kary Kos, MD;  Location: [place];  Service: Neurosurgery;  Laterality: N/A;  . TOTAL HIP ARTHROPLASTY Right 01/11/2013   Procedure: TOTAL HIP ARTHROPLASTY;  Surgeon: Garald Balding, MD;  Location: [place];  Service: Orthopedics;  Laterality: Right;  . TOTAL HIP ARTHROPLASTY Left 10/12/2016   Procedure: TOTAL HIP ARTHROPLASTY;  Surgeon: Garald Balding, MD;  Location: [place];  Service: Orthopedics;  Laterality: Left;    Allergies: Bee venom and Hydrocodone  Medications: Prior to Admission medications   Medication Sig Start Date End Date Taking? Authorizing Provider  acetaminophen (TYLENOL) 325 MG tablet Take 2 tablets (650 mg total) by mouth every 4 (four) hours as needed for mild pain ((score 1 to 3) or temp > 100.5). 08/09/19  Yes Angiulli, Lavon Paganini, PA-C  cyclobenzaprine (FLEXERIL) 10 MG tablet Take 1 tablet (10 mg total) by mouth 3 (three) times daily as needed for muscle spasms. 08/09/19  Yes Angiulli, Lavon Paganini, PA-C  nafcillin IVPB Inject 12 g into the vein daily. As a continuous infusion Indication:  Disseminated MSSA infection Last Day of Therapy:  09/04/19 Labs - Once weekly:  CBC/D and BMP, Labs - Every other week:  ESR and CRP 08/07/19 09/11/19 Yes Angiulli, Lavon Paganini, PA-C  Oxycodone HCl 10 MG TABS Take 1 tablet (10 mg total) by mouth every 4 (four) hours as needed ((score 7 to 10)). Patient taking differently: Take 10 mg by mouth every 4 (four) hours as needed (For pain).  08/20/19  Yes Lovorn, Jinny Blossom, MD  bisacodyl (DULCOLAX) 10 MG suppository Place 1 suppository (10 mg total) rectally daily as needed for moderate constipation. Patient not  taking: Reported on 08/20/2019 08/09/19   Cathlyn Parsons, PA-C  Multiple Vitamin (MULTIVITAMIN WITH MINERALS) TABS tablet Take 1 tablet by mouth daily. Patient not taking: Reported on 08/20/2019 08/10/19   Angiulli, Lavon Paganini, PA-C    pantoprazole (PROTONIX) 40 MG tablet Take 1 tablet (40 mg total) by mouth at bedtime. Patient not taking: Reported on 08/20/2019 08/09/19   Angiulli, Lavon Paganini, PA-C  polyethylene glycol (MIRALAX / GLYCOLAX) 17 g packet Take 17 g by mouth daily as needed for mild constipation. Patient not taking: Reported on 08/20/2019 08/09/19   Angiulli, Lavon Paganini, PA-C  predniSONE (DELTASONE) 5 MG tablet Take 1 tablet (5 mg total) by mouth daily with breakfast. Patient not taking: Reported on 08/20/2019 08/10/19   Angiulli, Lavon Paganini, PA-C  senna (SENOKOT) 8.6 MG TABS tablet Take 2 tablets (17.2 mg total) by mouth daily. Patient not taking: Reported on 08/20/2019 08/10/19   Angiulli, Lavon Paganini, PA-C  sodium chloride 0.9 % SOLN 452 mL with nafcillin 2 g SOLR 12 g Inject 12 g into the vein daily. Patient not taking: Reported on 08/20/2019 08/09/19   Angiulli, Lavon Paganini, PA-C  tamsulosin (FLOMAX) 0.4 MG CAPS capsule Take 1 capsule (0.4 mg total) by mouth daily after supper. Patient not taking: Reported on 08/20/2019 08/09/19   Angiulli, Lavon Paganini, PA-C  traZODone (DESYREL) 50 MG tablet Take 1 tablet (50 mg total) by mouth at bedtime as needed for sleep. Patient not taking: Reported on 08/20/2019 08/09/19   Angiulli, Lavon Paganini, PA-C     Family History  Problem Relation Age of Onset  . Cancer Father        Hodgkin's disease  . COPD Mother   . Heart attack Maternal Grandfather 80  . Diabetes Neg Hx   . Stroke Neg Hx   . Hypertension Neg Hx   . Hyperlipidemia Neg Hx     Social History   Socioeconomic History  . Marital status: Married    Spouse name: Manuela Schwartz  . Number of children: 2  . Years of education: High school  . Highest education level: Not on file  Occupational History  . Occupation: burial Occupational hygienist: [place]  Tobacco Use  . Smoking status: Former Smoker    Packs/day: 1.00    Years: 14.00    Pack years: 14.00    Types: Cigarettes    Quit date: 07/27/1999    Years since  quitting: 20.0  . Smokeless tobacco: Never Used  Substance and Sexual Activity  . Alcohol use: Yes    Alcohol/week: 24.0 standard drinks    Types: 24 Cans of beer per week    Comment: 4-5 beers per day, case of beer per week  . Drug use: No  . Sexual activity: Yes    Birth control/protection: Post-menopausal  Other Topics Concern  . Not on file  Social History Narrative   02/05/19   From: the area   Living: Living with wife Manuela Schwartz   Work: Dealer      Family: 2 children - [name] and [name] - liver nearby, no grandkids      Enjoys: float down the river, race track, camping      Exercise: just keeping busy at work   Diet: mostly meat and potatoes      Safety   Seat belts: Yes    Guns: Yes  and secure   Safe in relationships: Yes    Social Determinants of Health  Financial Resource Strain: Low Risk   . Difficulty of Paying Living Expenses: Not hard at all  Food Insecurity:   . Worried About Charity fundraiser in the Last Year: Not on file  . Ran Out of Food in the Last Year: Not on file  Transportation Needs:   . Lack of Transportation (Medical): Not on file  . Lack of Transportation (Non-Medical): Not on file  Physical Activity:   . Days of Exercise per Week: Not on file  . Minutes of Exercise per Session: Not on file  Stress:   . Feeling of Stress : Not on file  Social Connections:   . Frequency of Communication with Friends and Family: Not on file  . Frequency of Social Gatherings with Friends and Family: Not on file  . Attends Religious Services: Not on file  . Active Member of Clubs or Organizations: Not on file  . Attends Archivist Meetings: Not on file  . Marital Status: Not on file     Review of Systems: A 12 point ROS discussed and pertinent positives are indicated in the HPI above.  All other systems are negative.  Review of Systems  Constitutional: Positive for fever. Negative for chills.  Respiratory: Negative for cough and shortness  of breath.   Cardiovascular: Negative for chest pain.  Gastrointestinal: Negative for abdominal pain, nausea and vomiting.  Musculoskeletal: Positive for back pain.       (+) buttock pain 2/2 abscess  Skin: Positive for wound.  Neurological: Negative for dizziness and headaches.    Vital Signs: BP 112/66   Pulse 88   Temp (!) 97.5 F (36.4 C)   Resp 18   Ht '6\' 1"'$  (1.854 m)   Wt 230 lb 2.6 oz (104.4 kg)   SpO2 96%   BMI 30.37 kg/m   Physical Exam Vitals and nursing note reviewed.  Constitutional:      General: He is in acute distress.     Appearance: He is not ill-appearing.  HENT:     Head: Normocephalic.     Mouth/Throat:     Mouth: Mucous membranes are moist.     Pharynx: Oropharynx is clear. No oropharyngeal exudate or posterior oropharyngeal erythema.  Cardiovascular:     Rate and Rhythm: Normal rate and regular rhythm.  Pulmonary:     Effort: Pulmonary effort is normal.     Breath sounds: Normal breath sounds.  Abdominal:     General: There is no distension.     Palpations: Abdomen is soft.     Tenderness: There is no abdominal tenderness.  Skin:    General: Skin is warm and dry.  Neurological:     Mental Status: He is alert and oriented to person, place, and time.  Psychiatric:        Mood and Affect: Mood normal.        Behavior: Behavior normal.        Thought Content: Thought content normal.        Judgment: Judgment normal.      MD Evaluation Airway: WNL Heart: WNL Abdomen: WNL Chest/ Lungs: WNL ASA  Classification: 3 Mallampati/Airway Score: Two   Imaging: DG Lumbar Spine 2-3 Views  Result Date: 07/25/2019 CLINICAL DATA:  Decompression laminectomy L3-L4 for epidural abscess. EXAM: LUMBAR SPINE - 2-3 VIEW COMPARISON:  Lumbar spine MRI yesterday. FINDINGS: Two lateral portable cross-table views of the lumbar spine obtained in the operating room. Image number 1 demonstrates surgical instrument localizing posterior to  the L3 spinous process.  Image number 2 demonstrates surgical instrument localizing posterior to L4-L5. IMPRESSION: Lateral spot views obtained in the operating room localizing posterior to L3 spinous process and L4-L5. Electronically Signed   By: Keith Rake M.D.   On: 07/25/2019 03:11   CT PELVIS W CONTRAST  Result Date: 08/20/2019 CLINICAL DATA:  Right buttock abscess EXAM: CT PELVIS WITH CONTRAST TECHNIQUE: Multidetector CT imaging of the pelvis was performed using the standard protocol following the bolus administration of intravenous contrast. CONTRAST:  152m OMNIPAQUE IOHEXOL 300 MG/ML  SOLN COMPARISON:  07/19/2019 FINDINGS: Urinary Tract:  No abnormality visualized. Bowel:  Unremarkable visualized pelvic bowel loops. Vascular/Lymphatic: Atherosclerosis.  No aneurysm or adenopathy. Reproductive: Poorly visualized due to beam hardening artifact from bilateral hip replacements. Other:  No free fluid or free air. Musculoskeletal: Image quality in the pelvis is degraded due to extensive beam hardening artifact from bilateral hip replacements. There is gas noted throughout the soft tissues surrounding the lower sacrum and coccyx. No visible drainable fluid collection, but the gas throughout the soft tissues is compatible with infection, possibly with gas-forming organism. In addition, there is a rim enhancing low-density area within the right psoas muscle measuring 3.2 x 2.6 cm on axial image 13. On coronal image 76, this extends off the superior aspect of the study. The visualized portion has a craniocaudal length of 5.4 cm. IMPRESSION: Gas within the soft tissue surrounding the lower sacrum and coccyx concerning for infection with gas-forming organism. No visible drainable fluid collection. Evaluation of this area is limited due to beam hardening artifact from bilateral hip replacements. Rim enhancing low-density area within the right psoas muscle which concerning for intramuscular abscess. Full extent extends above the 1st  image for this CT pelvis. Electronically Signed   By: KRolm BaptiseM.D.   On: 08/20/2019 21:45   MR BRAIN W WO CONTRAST  Result Date: 07/26/2019 CLINICAL DATA:  Bacteremia.  Endocarditis.  L3-4 spinal infection. EXAM: MRI HEAD WITHOUT AND WITH CONTRAST TECHNIQUE: Multiplanar, multiecho pulse sequences of the brain and surrounding structures were obtained without and with intravenous contrast. CONTRAST:  142mGADAVIST GADOBUTROL 1 MMOL/ML IV SOLN COMPARISON:  None. FINDINGS: Brain: Diffusion imaging shows a 16 mm acute infarction in the peripheral right cerebellum. No hemorrhage or contrast enhancement in this location. Cerebral hemispheres show a punctate acute infarction at the right frontoparietal vertex. No other acute finding. The brainstem and cerebellum are otherwise normal. Cerebral hemispheres elsewhere are normal without evidence of old infarctions. No mass, hemorrhage, hydrocephalus or extra-axial collection. No abnormal brain or leptomeningeal enhancement. Vascular: Major vessels at the base of the brain show flow. Skull and upper cervical spine: Negative Sinuses/Orbits: Clear/normal Other: None IMPRESSION: 16 mm acute infarction in the peripheral right cerebellum. Punctate acute infarction at the right frontoparietal vertex. Differing vascular territories raise the possibility of embolic disease from the heart or ascending aorta. There is no evidence of hemorrhage or contrast enhancement that would allow me to make the specific diagnosis of septic emboli, but that is obviously the concern given the clinical history. Electronically Signed   By: MaNelson Chimes.D.   On: 07/26/2019 15:38   MR THORACIC SPINE W WO CONTRAST  Result Date: 07/24/2019 CLINICAL DATA:  Initial evaluation for acute low back pain with left leg pain. EXAM: MRI THORACIC AND LUMBAR SPINE WITHOUT AND WITH CONTRAST TECHNIQUE: Multiplanar and multiecho pulse sequences of the thoracic and lumbar spine were obtained without and with  intravenous contrast. CONTRAST:  54m GADAVIST GADOBUTROL 1 MMOL/ML IV SOLN COMPARISON:  Prior radiograph from 07/12/2019. FINDINGS: MRI THORACIC SPINE FINDINGS Alignment: Vertebral bodies normally aligned with preservation of the normal thoracic kyphosis. No listhesis. Vertebrae: Vertebral body height maintained without evidence for acute or chronic fracture. Multiple scattered chronic endplate Schmorl's nodes noted throughout the mid and lower thoracic spine. Underlying bone marrow signal intensity diffusely decreased on T1 weighted imaging, nonspecific, but most commonly related to anemia, smoking, or obesity. Few scattered benign hemangiomata noted within the T1, T6, and T12 vertebral bodies. No other discrete or worrisome osseous lesions. No other abnormal marrow edema or enhancement. No evidence for osteomyelitis discitis or septic arthritis. Cord: Signal intensity within the thoracic spinal cord is grossly within normal limits on this motion degraded exam. Normal cord caliber morphology. No abnormal enhancement. No appreciable epidural collection. Paraspinal and other soft tissues: Paraspinous soft tissues within normal limits. Minimal atelectatic changes noted within the posterior left lung. Disc levels: T1-2:  Unremarkable. T2-3: Mild right eccentric disc bulge. No canal or foraminal stenosis. T3-4: Negative interspace. Right greater than left facet hypertrophy. No stenosis. T4-5:  Negative interspace.  Mild facet hypertrophy.  No stenosis. T5-6: Diffuse disc bulge with intervertebral disc space narrowing. Mild facet hypertrophy. No significant spinal stenosis. Foramina remain patent. T6-7:  Unremarkable. T7-8: Shallow right paracentral disc protrusion indents the right ventral thecal sac. Minimal flattening of the ventral right cord without cord signal changes. No significant canal or foraminal stenosis. T8-9: Shallow central disc protrusion indents the ventral thecal sac, slightly eccentric to the right.  Minimal flattening of the ventral cord without cord signal changes. No significant spinal stenosis. Foramina remain patent. T9-10: Mild disc bulge.  No stenosis. T10-11: Mild disc bulge. Prominent right-sided endplate osteophytic spurring. Right greater than left facet hypertrophy. No stenosis. T11-12: Mild disc bulge. Moderate bilateral facet hypertrophy. No stenosis. T12-L1:  Unremarkable. MRI LUMBAR SPINE FINDINGS Segmentation: Standard. Lowest well-formed disc space labeled the L5-S1 level. Alignment: Trace retrolisthesis of L3 on L4 and L4 on L5. Alignment otherwise normal preservation of the normal lumbar lordosis. Vertebrae: Vertebral body height maintained without evidence for acute or chronic fracture. Bone marrow signal intensity diffusely decreased on T1 weighted imaging, nonspecific, but most commonly related to anemia, smoking, or obesity. Few scattered benign hemangiomata noted, most prominent of which position within the L2 and L3 vertebral bodies. No other worrisome osseous lesions. There is abnormal marrow edema and enhancement involving the right greater than left aspect of the L4 vertebral body, concerning for acute osteomyelitis. Extension into the right L4 pedicle with possible involvement of the right L3-4 facet (series 18, image 5). Mild edema and enhancement also noted at the right aspect of the L3 inferior endplate, which could also be involved. There is preserved disc height at the intervening L3-4 interspace without overt evidence for acute discitis. Associated paraspinous edema and enhancement seen involving the adjacent psoas musculature bilaterally, extending from L2-3 inferiorly, worse on the right. Superimposed paraspinous abscesses are seen bilaterally. Collection on the right measures 3.9 x 3.2 cm (series 19, image 26). Few scattered heterogeneous but smaller collection seen involving the left psoas, largest of which seen at the level of L4-5 and measures 2.1 cm (series 19, image  25). Evidence for epidural extension of infection with epidural abscess seen extending from approximately L3-4 through L4-5. Abscess primarily involves the dorsal epidural space at the level of L3-4 (series 19, image 18). Overall, collection measures approximately 1.0 x 0.7 x 6.3 cm in greatest dimensions (  AP by transverse by craniocaudad). Secondary mass effect on the thecal sac which is compressed anteriorly at the level of L3-4 (series 19, image 18), into the left at the level of L4-5 (series 19, image 27). There is fairly diffuse severe spinal stenosis extending from L3 through L4-5. Conus medullaris: Extends to the L1 level and appears normal. Paraspinal and other soft tissues: Paraspinal infection with associated bilateral psoas muscle abscesses as above. Additional mild soft tissue edema adjacent to the right L3-4 facet, likely infected. Disc levels: L1-2: Mild circumferential disc bulge. Mild bilateral facet hypertrophy. No significant stenosis. L2-3: Minimal annular disc bulge. Mild to moderate bilateral facet hypertrophy. No significant canal or foraminal stenosis. L3-4: Findings concerning for osteomyelitis involving the L4 and to a lesser extent L3 vertebral bodies. Suspected discitis involving the L3-4 interspace, although no overt changes evident by MRI as of yet. Circumferential disc bulge. Moderate bilateral facet hypertrophy with small bilateral joint effusions. Edema enhancement about the right L3-4 facet, suspected to be infected. Dorsal epidural abscess with compression of the thecal sac anteriorly. Resultant severe spinal stenosis. Moderate right L3 foraminal narrowing. L4-5: Diffuse disc bulge with disc desiccation. Changes related osteomyelitis involving the L4 vertebral body. Moderate bilateral facet hypertrophy. Epidural abscess involving the dorsal and right aspect of the spinal canal with compression of the thecal sac to the left. Resultant severe spinal stenosis. Moderate right with mild  left L4 foraminal narrowing. L5-S1: Negative interspace. Moderate to severe right worse than left facet hypertrophy. No stenosis. IMPRESSION: 1. Findings consistent with acute osteomyelitis centered about the L3-4 interspace as above. Associated epidural abscess extending from L3-4 through L4-5 with resultant severe diffuse spinal stenosis. 2. Associated paraspinous soft tissue infection with bilateral psoas muscle abscesses, right greater than left. 3. No other evidence for acute or distant infection elsewhere within the thoracic or lumbar spine. 4. Underlying mild to moderate multilevel degenerative spondylosis as above. No other high-grade stenosis or impingement. Critical Value/emergent results were called by telephone at the time of interpretation on 07/24/2019 at 9:22 pm to providerCaroline Rau , who verbally acknowledged these results. Electronically Signed   By: Jeannine Boga M.D.   On: 07/24/2019 21:23   MR Lumbar Spine W Wo Contrast  Result Date: 07/24/2019 CLINICAL DATA:  Initial evaluation for acute low back pain with left leg pain. EXAM: MRI THORACIC AND LUMBAR SPINE WITHOUT AND WITH CONTRAST TECHNIQUE: Multiplanar and multiecho pulse sequences of the thoracic and lumbar spine were obtained without and with intravenous contrast. CONTRAST:  44m GADAVIST GADOBUTROL 1 MMOL/ML IV SOLN COMPARISON:  Prior radiograph from 07/12/2019. FINDINGS: MRI THORACIC SPINE FINDINGS Alignment: Vertebral bodies normally aligned with preservation of the normal thoracic kyphosis. No listhesis. Vertebrae: Vertebral body height maintained without evidence for acute or chronic fracture. Multiple scattered chronic endplate Schmorl's nodes noted throughout the mid and lower thoracic spine. Underlying bone marrow signal intensity diffusely decreased on T1 weighted imaging, nonspecific, but most commonly related to anemia, smoking, or obesity. Few scattered benign hemangiomata noted within the T1, T6, and T12 vertebral  bodies. No other discrete or worrisome osseous lesions. No other abnormal marrow edema or enhancement. No evidence for osteomyelitis discitis or septic arthritis. Cord: Signal intensity within the thoracic spinal cord is grossly within normal limits on this motion degraded exam. Normal cord caliber morphology. No abnormal enhancement. No appreciable epidural collection. Paraspinal and other soft tissues: Paraspinous soft tissues within normal limits. Minimal atelectatic changes noted within the posterior left lung. Disc levels: T1-2:  Unremarkable.  T2-3: Mild right eccentric disc bulge. No canal or foraminal stenosis. T3-4: Negative interspace. Right greater than left facet hypertrophy. No stenosis. T4-5:  Negative interspace.  Mild facet hypertrophy.  No stenosis. T5-6: Diffuse disc bulge with intervertebral disc space narrowing. Mild facet hypertrophy. No significant spinal stenosis. Foramina remain patent. T6-7:  Unremarkable. T7-8: Shallow right paracentral disc protrusion indents the right ventral thecal sac. Minimal flattening of the ventral right cord without cord signal changes. No significant canal or foraminal stenosis. T8-9: Shallow central disc protrusion indents the ventral thecal sac, slightly eccentric to the right. Minimal flattening of the ventral cord without cord signal changes. No significant spinal stenosis. Foramina remain patent. T9-10: Mild disc bulge.  No stenosis. T10-11: Mild disc bulge. Prominent right-sided endplate osteophytic spurring. Right greater than left facet hypertrophy. No stenosis. T11-12: Mild disc bulge. Moderate bilateral facet hypertrophy. No stenosis. T12-L1:  Unremarkable. MRI LUMBAR SPINE FINDINGS Segmentation: Standard. Lowest well-formed disc space labeled the L5-S1 level. Alignment: Trace retrolisthesis of L3 on L4 and L4 on L5. Alignment otherwise normal preservation of the normal lumbar lordosis. Vertebrae: Vertebral body height maintained without evidence for  acute or chronic fracture. Bone marrow signal intensity diffusely decreased on T1 weighted imaging, nonspecific, but most commonly related to anemia, smoking, or obesity. Few scattered benign hemangiomata noted, most prominent of which position within the L2 and L3 vertebral bodies. No other worrisome osseous lesions. There is abnormal marrow edema and enhancement involving the right greater than left aspect of the L4 vertebral body, concerning for acute osteomyelitis. Extension into the right L4 pedicle with possible involvement of the right L3-4 facet (series 18, image 5). Mild edema and enhancement also noted at the right aspect of the L3 inferior endplate, which could also be involved. There is preserved disc height at the intervening L3-4 interspace without overt evidence for acute discitis. Associated paraspinous edema and enhancement seen involving the adjacent psoas musculature bilaterally, extending from L2-3 inferiorly, worse on the right. Superimposed paraspinous abscesses are seen bilaterally. Collection on the right measures 3.9 x 3.2 cm (series 19, image 26). Few scattered heterogeneous but smaller collection seen involving the left psoas, largest of which seen at the level of L4-5 and measures 2.1 cm (series 19, image 25). Evidence for epidural extension of infection with epidural abscess seen extending from approximately L3-4 through L4-5. Abscess primarily involves the dorsal epidural space at the level of L3-4 (series 19, image 18). Overall, collection measures approximately 1.0 x 0.7 x 6.3 cm in greatest dimensions (AP by transverse by craniocaudad). Secondary mass effect on the thecal sac which is compressed anteriorly at the level of L3-4 (series 19, image 18), into the left at the level of L4-5 (series 19, image 27). There is fairly diffuse severe spinal stenosis extending from L3 through L4-5. Conus medullaris: Extends to the L1 level and appears normal. Paraspinal and other soft tissues:  Paraspinal infection with associated bilateral psoas muscle abscesses as above. Additional mild soft tissue edema adjacent to the right L3-4 facet, likely infected. Disc levels: L1-2: Mild circumferential disc bulge. Mild bilateral facet hypertrophy. No significant stenosis. L2-3: Minimal annular disc bulge. Mild to moderate bilateral facet hypertrophy. No significant canal or foraminal stenosis. L3-4: Findings concerning for osteomyelitis involving the L4 and to a lesser extent L3 vertebral bodies. Suspected discitis involving the L3-4 interspace, although no overt changes evident by MRI as of yet. Circumferential disc bulge. Moderate bilateral facet hypertrophy with small bilateral joint effusions. Edema enhancement about the right L3-4  facet, suspected to be infected. Dorsal epidural abscess with compression of the thecal sac anteriorly. Resultant severe spinal stenosis. Moderate right L3 foraminal narrowing. L4-5: Diffuse disc bulge with disc desiccation. Changes related osteomyelitis involving the L4 vertebral body. Moderate bilateral facet hypertrophy. Epidural abscess involving the dorsal and right aspect of the spinal canal with compression of the thecal sac to the left. Resultant severe spinal stenosis. Moderate right with mild left L4 foraminal narrowing. L5-S1: Negative interspace. Moderate to severe right worse than left facet hypertrophy. No stenosis. IMPRESSION: 1. Findings consistent with acute osteomyelitis centered about the L3-4 interspace as above. Associated epidural abscess extending from L3-4 through L4-5 with resultant severe diffuse spinal stenosis. 2. Associated paraspinous soft tissue infection with bilateral psoas muscle abscesses, right greater than left. 3. No other evidence for acute or distant infection elsewhere within the thoracic or lumbar spine. 4. Underlying mild to moderate multilevel degenerative spondylosis as above. No other high-grade stenosis or impingement. Critical  Value/emergent results were called by telephone at the time of interpretation on 07/24/2019 at 9:22 pm to providerCaroline Rau , who verbally acknowledged these results. Electronically Signed   By: Jeannine Boga M.D.   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 The antibiotics were administered within an appropriate time frame prior to the initiation of the procedure. ANESTHESIA/SEDATION: Fentanyl 100 mcg IV; Versed 2.0 mg IV Moderate Sedation Time:  10 MINUTES The patient was continuously monitored during the procedure by the interventional radiology nurse under my direct supervision. COMPLICATIONS: None immediate. PROCEDURE: Informed written consent was obtained from the patient after a thorough discussion of the procedural risks, benefits and alternatives. All questions were addressed. Maximal Sterile Barrier Technique was utilized including caps, mask, sterile gowns, sterile gloves, sterile drape, hand hygiene and skin antiseptic. A timeout was performed prior to the initiation of the procedure. Previous imaging reviewed. Patient position prone. Noncontrast localization CT performed. The right psoas muscle fluid collection was localized and marked for a posterior approach. Under sterile conditions and local anesthesia, a 17 gauge 11.8 cm access was advanced from a posterior paraspinous approach into the fluid collection. Needle position confirmed with CT. Syringe aspiration yielded 30 cc purulent fluid. Sample sent for culture. Needle removed. Postprocedure imaging demonstrates no hemorrhage or hematoma. Patient tolerated the procedure well. IMPRESSION: Successful CT-guided needle aspiration of the right psoas muscle paraspinous abscess. Electronically Signed    By: Jerilynn Mages.  Shick M.D.   On: 07/25/2019 15:09   DG Chest Port 1 View  Result Date: 07/24/2019 CLINICAL DATA:  Sepsis. EXAM: PORTABLE CHEST 1 VIEW COMPARISON:  Chest x-ray dated September 29, 2016. FINDINGS: The heart size and mediastinal contours are within normal limits. Both lungs are clear. The visualized skeletal structures are unremarkable. IMPRESSION: No active disease. Electronically Signed   By: Titus Dubin M.D.   On: 07/24/2019 18:10   ECHOCARDIOGRAM COMPLETE  Result Date: 07/27/2019   ECHOCARDIOGRAM REPORT   Patient Name:   [name] Transue Date of Exam: 07/27/2019 Medical Rec #:  [id number]     Height:       73.0 in Accession #:    [id number]    Weight:       235.0 lb Date of Birth:  1965-11-09     BSA:  2.30 m Patient Age:    33 years      BP:           142/82 mmHg Patient Gender: M             HR:           88 bpm. Exam Location:  Inpatient Procedure: 2D Echo Indications:   Bacteremia 790.7  History:       Patient has no prior history of Echocardiogram examinations.                Abscess.  Sonographer:   Johny Chess Referring      Howe Phys: IMPRESSIONS  1. Left ventricular ejection fraction, by visual estimation, is 55 to 60%. The left ventricle has normal function. There is borderline left ventricular hypertrophy.  2. Mildly dilated left ventricular internal cavity size.  3. The left ventricle has no regional wall motion abnormalities.  4. Global right ventricle has normal systolic function.The right ventricular size is normal. No increase in right ventricular wall thickness.  5. Left atrial size was normal.  6. Right atrial size was normal.  7. Presence of pericardial fat pad.  8. The mitral valve is grossly normal. Trivial mitral valve regurgitation.  9. The tricuspid valve is grossly normal. 10. The aortic valve is tricuspid. Aortic valve regurgitation is not visualized. 11. The pulmonic valve was grossly normal. Pulmonic valve regurgitation is not visualized. 12.  TR signal is inadequate for assessing pulmonary artery systolic pressure. 13. The inferior vena cava is normal in size with greater than 50% respiratory variability, suggesting right atrial pressure of 3 mmHg. 14. No obvious valvular vegetations identified. FINDINGS  Left Ventricle: Left ventricular ejection fraction, by visual estimation, is 55 to 60%. The left ventricle has normal function. The left ventricle has no regional wall motion abnormalities. The left ventricular internal cavity size was mildly dilated left ventricle. There is borderline left ventricular hypertrophy. Left ventricular diastolic parameters were normal. Right Ventricle: The right ventricular size is normal. No increase in right ventricular wall thickness. Global RV systolic function is has normal systolic function. Left Atrium: Left atrial size was normal in size. Right Atrium: Right atrial size was normal in size Pericardium: There is no evidence of pericardial effusion. Presence of pericardial fat pad. Mitral Valve: The mitral valve is grossly normal. Trivial mitral valve regurgitation. Tricuspid Valve: The tricuspid valve is grossly normal. Tricuspid valve regurgitation is trivial. Aortic Valve: The aortic valve is tricuspid. Aortic valve regurgitation is not visualized. Pulmonic Valve: The pulmonic valve was grossly normal. Pulmonic valve regurgitation is not visualized. Pulmonic regurgitation is not visualized. Aorta: The aortic root is normal in size and structure. Venous: The inferior vena cava is normal in size with greater than 50% respiratory variability, suggesting right atrial pressure of 3 mmHg. IAS/Shunts: No atrial level shunt detected by color flow Doppler.  LEFT VENTRICLE PLAX 2D LVIDd:         6.20 cm  Diastology LVIDs:         4.30 cm  LV e' lateral:   10.90 cm/s LV PW:         1.10 cm  LV E/e' lateral: 6.6 LV IVS:        0.80 cm  LV e' medial:    8.27 cm/s LVOT diam:     2.20 cm  LV E/e' medial:  8.8 LV SV:         111 ml  LV SV  Index:   46.82 LVOT Area:     3.80 cm  RIGHT VENTRICLE RV S prime:     13.20 cm/s LEFT ATRIUM             Index       RIGHT ATRIUM           Index LA diam:        3.50 cm 1.52 cm/m  RA Area:     15.50 cm LA Vol (A2C):   35.3 ml 15.32 ml/m RA Volume:   40.40 ml  17.53 ml/m LA Vol (A4C):   42.7 ml 18.53 ml/m LA Biplane Vol: 41.5 ml 18.01 ml/m  AORTIC VALVE LVOT Vmax:   94.00 cm/s LVOT Vmean:  55.800 cm/s LVOT VTI:    0.166 m  AORTA Ao Root diam: 3.40 cm MITRAL VALVE MV Area (PHT): 4.68 cm             SHUNTS MV PHT:        46.98 msec           Systemic VTI:  0.17 m MV Decel Time: 162 msec             Systemic Diam: 2.20 cm MV E velocity: 72.40 cm/s 103 cm/s MV A velocity: 70.70 cm/s 70.3 cm/s MV E/A ratio:  1.02       1.5  Rozann Lesches MD Electronically signed by Rozann Lesches MD Signature Date/Time: 07/27/2019/3:45:11 PM    Final    VAS [place] LOWER EXTREMITY VENOUS (DVT)  Result Date: 08/01/2019  Lower Venous Study Indications: Swelling.  Risk Factors: None identified. Comparison Study: No prior studies. Performing Technologist: Oliver Hum RVT  Examination Guidelines: A complete evaluation includes B-mode imaging, spectral Doppler, color Doppler, and power Doppler as needed of all accessible portions of each vessel. Bilateral testing is considered an integral part of a complete examination. Limited examinations for reoccurring indications may be performed as noted.  +---------+---------------+---------+-----------+----------+--------------+ RIGHT    CompressibilityPhasicitySpontaneityPropertiesThrombus Aging +---------+---------------+---------+-----------+----------+--------------+ CFV      Full           Yes      Yes                                 +---------+---------------+---------+-----------+----------+--------------+ SFJ      Full                                                        +---------+---------------+---------+-----------+----------+--------------+ FV Prox   Full                                                        +---------+---------------+---------+-----------+----------+--------------+ FV Mid   Full                                                        +---------+---------------+---------+-----------+----------+--------------+ FV DistalFull                                                        +---------+---------------+---------+-----------+----------+--------------+  PFV      Full                                                        +---------+---------------+---------+-----------+----------+--------------+ POP      Full           Yes      Yes                                 +---------+---------------+---------+-----------+----------+--------------+ PTV      Full                                                        +---------+---------------+---------+-----------+----------+--------------+ PERO     Full                                                        +---------+---------------+---------+-----------+----------+--------------+   +---------+---------------+---------+-----------+----------+--------------+ LEFT     CompressibilityPhasicitySpontaneityPropertiesThrombus Aging +---------+---------------+---------+-----------+----------+--------------+ CFV      Full           Yes      Yes                                 +---------+---------------+---------+-----------+----------+--------------+ SFJ      Full                                                        +---------+---------------+---------+-----------+----------+--------------+ FV Prox  Full                                                        +---------+---------------+---------+-----------+----------+--------------+ FV Mid   Full                                                        +---------+---------------+---------+-----------+----------+--------------+ FV DistalFull                                                         +---------+---------------+---------+-----------+----------+--------------+ PFV      Full                                                        +---------+---------------+---------+-----------+----------+--------------+   POP      Full           Yes      Yes                                 +---------+---------------+---------+-----------+----------+--------------+ PTV      Full                                                        +---------+---------------+---------+-----------+----------+--------------+ PERO     Full                                                        +---------+---------------+---------+-----------+----------+--------------+     Summary: Right: There is no evidence of deep vein thrombosis in the lower extremity. No cystic structure found in the popliteal fossa. Left: There is no evidence of deep vein thrombosis in the lower extremity. No cystic structure found in the popliteal fossa.  *See table(s) above for measurements and observations. Electronically signed by Curt Jews MD on 08/01/2019 at 4:40:35 PM.    Final    [place] EKG SITE RITE  Result Date: 07/30/2019 If Site Rite image not attached, placement could not be confirmed due to current cardiac rhythm.   Labs:  CBC: Recent Labs    08/10/19 0525 08/13/19 1516 08/20/19 2016 08/21/19 0714  WBC 11.7* 9.8 14.8* 11.6*  HGB 11.1* 9.6* 10.7* 9.2*  HCT 35.4* 30.3* 32.4* 28.6*  PLT 438* 360 512* 435*    COAGS: Recent Labs    07/24/19 1734 08/20/19 2057  INR 1.5* 1.5*  APTT 26 38*    BMP: Recent Labs    08/10/19 0525 08/13/19 1516 08/20/19 2016 08/21/19 0714  NA 133* 136 131* 135  K 3.8 3.6 3.6 3.6  CL 99 99 96* 101  CO2 '24 26 22 24  '$ GLUCOSE 105* 117* 139* 110*  BUN '8 8 9 7  '$ CALCIUM 8.8* 8.5* 8.8* 8.5*  CREATININE 0.89 0.72 0.93 0.76  GFRNONAA >60 >60 >60 >60  GFRAA >60 >60 >60 >60    LIVER FUNCTION TESTS: Recent Labs    08/01/19 0530 08/06/19 0353  08/13/19 1516 08/20/19 2016  BILITOT 0.3 0.8 1.2 1.2  AST 18 14* 15 19  ALT '24 19 20 17  '$ ALKPHOS 62 62 65 61  PROT 6.1* 6.4* 6.3* 7.6  ALBUMIN 1.8* 1.9* 2.3* 2.1*    TUMOR MARKERS: No results for input(s): AFPTM, CEA, CA199, CHROMGRNA in the last 8760 hours.  Assessment and Plan:   54 y/o M with history as per HPI who presented to [facility] ED with complaints of draining right buttock wound. CT pelvis notable for for gas within the soft tissue surrounding the lower sacrum and coccyx, which is being followed by [facility], and a rim enhancing low-density area within the right psoas muscle concerning for intramuscular abscess. IR has been consulted for aspiration/possible drain placement within the right psoas abscess. Patient history and imaging have been reviewed by Dr. Laurence Ferrari today who agrees to proceed.  Patient has been NPO since midnight, he is not currently on any blood thinning medications.  Tmax 100.7, WBC 11.6, hgb 9.2, plt 435, INR 1.5. IR will continue to follow while in house.  Risks and benefits discussed with the patient including bleeding, infection, damage to adjacent structures, bowel perforation/fistula connection, and sepsis.  All of the patient's questions were answered, patient is agreeable to proceed.  Consent signed and in chart.   Thank you for this interesting consult.  I greatly enjoyed meeting KRISTEN BUSHWAY and look forward to participating in their care.  A copy of this report was sent to the requesting provider on this date.  Electronically Signed: Joaquim Nam, PA-C 08/21/2019, 8:35 AM   I spent a total of 40 Minutes in face to face in clinical consultation, greater than 50% of which was counseling/coordinating care for right psoas muscle abscess aspiration/drain placement.

## 2019-08-21 NOTE — Telephone Encounter (Signed)
Called and spoke with daughter at length- pt back in hospital- has a R psoas abscess that was draining through decub in backside, it sounds like.  They are supposed to drain the abscess or place a drain by IR today. Will try and keep track of pt, since not on my service and monitor issues Daughter reported pt CONFUSED and cannot remember/tell her anything that's going on- need to call daughter ot give updates.

## 2019-08-21 NOTE — Consult Note (Signed)
[place] Nurse Consult Note: Patient receiving care in [place].  Consult completed remotely after review of record, including images of wounds. Reason for Consult: "sacral pressure ulcers/infected wounds" Wound type: Infected wounds to sacral/coccyx area and right buttock Pressure Injury POA: Yes/No/NA Measurement: see Dr. Cristal Generous note from 08/20/19 Wound bed:  Drainage (amount, consistency, odor) heavy purulent Periwound: erythematous bilateral buttocks Dressing procedure/placement/frequency: Place saline moistened gauze into the sacral and buttock wounds. Cover with ABD pads.  Per CT results from 1/25, there is gas in the soft tissues surrounding the lower sacrum and coccyx. Dr. Donne Hazel is following.  Monitor the wound area(s) for worsening of condition such as: Signs/symptoms of infection,  Increase in size,  Development of or worsening of odor, Development of pain, or increased pain at the affected locations.  Notify the medical team if any of these develop.  Thank you for the consult.  [place] nurse will not follow at this time.  Please re-consult the [place] team if needed.  [name] Riles, RN, MSN, CWOCN, CNS-BC, pager [phone]

## 2019-08-21 NOTE — Progress Notes (Signed)
PROGRESS NOTE    [name]    Code Status: Full Code  [id number] DOB: 03/24/1966 DOA: 08/20/2019  PCP: Lesleigh Noe, MD    Hospital Summary  This is a 54 year old male with recent hospital admission from 12/29-1/6 for lumbar epidural abscess, cauda equina syndrome, severe spinal stenosis status post decompressive lumbar laminectomy L3-4, 4-5 for evacuation of epidural abscess on 12/30 and CT aspiration of right psoas abscess on 12/30 found to have MSSA bacteremia started on nafcillin for 6 weeks via PICC line planned through 2/9 per ID who was in CIR and discharged 1/15 and noticed pain and drainage of buttock area, febrile in the ED and tachycardic leukocytosis.  Started on vancomycin/Zosyn, IV fluids and found to have right psoas muscle intramuscular abscess.  General surgery and IR have been consulted.    1/25: General surgery was able to debride large amount of material at bedside  1/26: IR guided right psoas muscle abscess drainage  A & P   Principal Problem:   Infected pressure ulcer Active Problems:   Sepsis ([place])   Psoas abscess, right (HCC)   Hyponatremia   Alcohol use   1. Sepsis secondary to cellulitis, sacral pressure ulcer superinfection present on admission with right psoas intramuscular abscess a. Sepsis has improved/resolved since admission b. Status post IR guided right psoas muscle abscess drainage today with 25 cc.  Purulent material removed c. Wound care d. Morphine as needed every 4 hours for severe pain e. On vancomycin/Zosyn -monitor renal function f. Follow-up cultures of abscess and blood cultures g. Consider ID consult pending further work-up 2. Acute encephalopathy, suspect medication and sepsis induced  a. Delirium noted today - received fentanyl, Flexeril and Versed today b. Daughter noted patient has had confusion at home recently as well since his discharge c. Delirium precautions d. Checked CMP this p.m., unremarkable for any  electrolyte or renal abnormalities e. Hold sedating medications 3. Cauda equina syndrome/neurogenic bladder a. Placed on Flomax since last hospitalization b. Continue bladder scans 4. Alcohol use a. Delirium above unlikely alcohol-related as no recent use b. Continue CIWA protocol 5. Hyponatremia, resolved  DVT prophylaxis: Lovenox, SCDs Family Communication: Patient's daughter has been updated by phone Disposition Plan: Inpatient pending further infection management  Consultants  General surgery IR  Procedures  IR guided right abscess drainage 1/26  Antibiotics   Anti-infectives (From admission, onward)   Start     Dose/Rate Route Frequency Ordered Stop   08/21/19 0900  vancomycin (VANCOREADY) IVPB 1250 mg/250 mL     1,250 mg 166.7 mL/hr over 90 Minutes Intravenous Every 12 hours 08/20/19 2114     08/21/19 0400  piperacillin-tazobactam (ZOSYN) IVPB 3.375 g     3.375 g 12.5 mL/hr over 240 Minutes Intravenous Every 8 hours 08/20/19 2314     08/20/19 2100  vancomycin (VANCOCIN) 2,500 mg in sodium chloride 0.9 % 500 mL IVPB     2,500 mg 250 mL/hr over 120 Minutes Intravenous  Once 08/20/19 2059 08/20/19 2321   08/20/19 2045  piperacillin-tazobactam (ZOSYN) IVPB 3.375 g     3.375 g 100 mL/hr over 30 Minutes Intravenous  Once 08/20/19 2037 08/20/19 2130           Subjective   Patient seen and examined lying on right side resting comfortably at bedside.  States his pain has somewhat improved but is increased with any motion.  Seen prior to IR procedure, he is frustrated he has not been able to eat yet.  Denies  any chest pain, shortness of breath, nausea or vomiting.  Admits to being able to have bowel movements but trying not to given his current situation.  Notified this afternoon by nursing who stated patient had   Objective   Vitals:   08/21/19 1055 08/21/19 1100 08/21/19 1105 08/21/19 1157  BP: 120/72 113/71 116/67 103/62  Pulse: 91 90 85 84  Resp:    18  Temp:     98.7 F (37.1 C)  TempSrc:    Oral  SpO2: 99% 99% 99% 98%  Weight:      Height:        Intake/Output Summary (Last 24 hours) at 08/21/2019 1803 Last data filed at 08/21/2019 1300 Gross per 24 hour  Intake 1305.35 ml  Output 500 ml  Net 805.35 ml   Filed Weights   08/20/19 2111 08/21/19 0115  Weight: 108.9 kg 104.4 kg    Examination:  Physical Exam Vitals and nursing note reviewed.  Constitutional:      General: He is not in acute distress. HENT:     Head: Normocephalic.  Eyes:     Conjunctiva/sclera: Conjunctivae normal.  Cardiovascular:     Rate and Rhythm: Normal rate and regular rhythm.  Pulmonary:     Effort: Pulmonary effort is normal.     Breath sounds: Normal breath sounds.  Abdominal:     General: Abdomen is flat. There is no distension.  Musculoskeletal:     Right lower leg: No edema.     Left lower leg: No edema.     Comments: Dressing covering sacral infection  Neurological:     Mental Status: He is alert. Mental status is at baseline.  Psychiatric:        Mood and Affect: Mood normal.        Behavior: Behavior normal.     Data Reviewed: I have personally reviewed following labs and imaging studies  CBC: Recent Labs  Lab 08/20/19 2016 08/21/19 0714  WBC 14.8* 11.6*  NEUTROABS 12.8*  --   HGB 10.7* 9.2*  HCT 32.4* 28.6*  MCV 93.4 93.8  PLT 512* [id number]*   Basic Metabolic Panel: Recent Labs  Lab 08/20/19 2016 08/21/19 0714 08/21/19 1626  NA 131* 135 136  K 3.6 3.6 3.9  CL 96* 101 103  CO2 22 24 22   GLUCOSE 139* 110* 106*  BUN 9 7 7   CREATININE 0.93 0.76 0.80  CALCIUM 8.8* 8.5* 8.4*  MG  --  1.8  --   PHOS  --  4.3  --    GFR: Estimated Creatinine Clearance: 133.9 mL/min (by C-G formula based on SCr of 0.8 mg/dL). Liver Function Tests: Recent Labs  Lab 08/20/19 2016 08/21/19 1626  AST 19 13*  ALT 17 13  ALKPHOS 61 51  BILITOT 1.2 1.0  PROT 7.6 6.4*  ALBUMIN 2.1* 1.8*   No results for input(s): LIPASE, AMYLASE in the  last 168 hours. No results for input(s): AMMONIA in the last 168 hours. Coagulation Profile: Recent Labs  Lab 08/20/19 2057  INR 1.5*   Cardiac Enzymes: No results for input(s): CKTOTAL, CKMB, CKMBINDEX, TROPONINI in the last 168 hours. BNP (last 3 results) No results for input(s): PROBNP in the last 8760 hours. HbA1C: No results for input(s): HGBA1C in the last 72 hours. CBG: No results for input(s): GLUCAP in the last 168 hours. Lipid Profile: No results for input(s): CHOL, HDL, LDLCALC, TRIG, CHOLHDL, LDLDIRECT in the last 72 hours. Thyroid Function Tests: No results for input(s):  TSH, T4TOTAL, FREET4, T3FREE, THYROIDAB in the last 72 hours. Anemia Panel: No results for input(s): VITAMINB12, FOLATE, FERRITIN, TIBC, IRON, RETICCTPCT in the last 72 hours. Sepsis Labs: Recent Labs  Lab 08/20/19 2016 08/20/19 2251  LATICACIDVEN 3.2* 1.0    Recent Results (from the past 240 hour(s))  Blood culture (routine x 2)     Status: None (Preliminary result)   Collection Time: 08/20/19  8:15 PM   Specimen: BLOOD LEFT ARM  Result Value Ref Range Status   Specimen Description BLOOD LEFT ARM  Final   Special Requests   Final    BOTTLES DRAWN AEROBIC AND ANAEROBIC Blood Culture adequate volume   Culture   Final    NO GROWTH < 24 HOURS Performed at [facility], [place] [address] [place], [place] [address]    Report Status PENDING  Incomplete  Blood culture (routine x 2)     Status: None (Preliminary result)   Collection Time: 08/20/19  8:30 PM   Specimen: BLOOD LEFT ARM  Result Value Ref Range Status   Specimen Description BLOOD LEFT ARM  Final   Special Requests   Final    BOTTLES DRAWN AEROBIC ONLY Blood Culture results may not be optimal due to an excessive volume of blood received in culture bottles   Culture   Final    NO GROWTH < 24 HOURS Performed at [facility], [place] [address] [place], [place] [address]    Report Status PENDING  Incomplete  Respiratory Panel  by RT PCR (Flu A&B, Covid) - Nasopharyngeal Swab     Status: None   Collection Time: 08/20/19 10:51 PM   Specimen: Nasopharyngeal Swab  Result Value Ref Range Status   SARS Coronavirus 2 by RT PCR NEGATIVE NEGATIVE Final    Comment: (NOTE) SARS-CoV-2 target nucleic acids are NOT DETECTED. The SARS-CoV-2 RNA is generally detectable in upper respiratoy specimens during the acute phase of infection. The lowest concentration of SARS-CoV-2 viral copies this assay can detect is 131 copies/mL. A negative result does not preclude SARS-Cov-2 infection and should not be used as the sole basis for treatment or other patient management decisions. A negative result may occur with  improper specimen collection/handling, submission of specimen other than nasopharyngeal swab, presence of viral mutation(s) within the areas targeted by this assay, and inadequate number of viral copies (<131 copies/mL). A negative result must be combined with clinical observations, patient history, and epidemiological information. The expected result is Negative. Fact Sheet for Patients:  PinkCheek.be Fact Sheet for Healthcare Providers:  GravelBags.it This test is not yet ap proved or cleared by the [place] FDA and  has been authorized for detection and/or diagnosis of SARS-CoV-2 by FDA under an Emergency Use Authorization (EUA). This EUA will remain  in effect (meaning this test can be used) for the duration of the COVID-19 declaration under Section 564(b)(1) of the Act, 21 U.S.C. section 360bbb-3(b)(1), unless the authorization is terminated or revoked sooner.    Influenza A by PCR NEGATIVE NEGATIVE Final   Influenza B by PCR NEGATIVE NEGATIVE Final    Comment: (NOTE) The Xpert Xpress SARS-CoV-2/FLU/RSV assay is intended as an aid in  the diagnosis of influenza from Nasopharyngeal swab specimens and  should not be used as a sole basis for treatment.  Nasal washings and  aspirates are unacceptable for Xpert Xpress SARS-CoV-2/FLU/RSV  testing. Fact Sheet for Patients: PinkCheek.be Fact Sheet for Healthcare Providers: GravelBags.it This test is not yet approved or cleared  by the [place] and  has been authorized for detection and/or diagnosis of SARS-CoV-2 by  FDA under an Emergency Use Authorization (EUA). This EUA will remain  in effect (meaning this test can be used) for the duration of the  Covid-19 declaration under Section 564(b)(1) of the Act, 21  U.S.C. section 360bbb-3(b)(1), unless the authorization is  terminated or revoked. Performed at [facility], [place] [address], [place], [place] [address]   MRSA PCR Screening     Status: None   Collection Time: 08/21/19  5:20 AM   Specimen: Nasopharyngeal  Result Value Ref Range Status   MRSA by PCR NEGATIVE NEGATIVE Final    Comment:        The GeneXpert MRSA Assay (FDA approved for NASAL specimens only), is one component of a comprehensive MRSA colonization surveillance program. It is not intended to diagnose MRSA infection nor to guide or monitor treatment for MRSA infections. Performed at [facility], [place] [address], [place], [place] [address]   Aerobic/Anaerobic Culture (surgical/deep wound)     Status: None (Preliminary result)   Collection Time: 08/21/19 11:22 AM   Specimen: Abscess  Result Value Ref Range Status   Specimen Description ABSCESS  Final   Special Requests Normal  Final   Gram Stain   Final    MODERATE WBC PRESENT, PREDOMINANTLY MONONUCLEAR NO ORGANISMS SEEN Performed at [facility], [place] [address] [place], [place] [address]    Culture PENDING  Incomplete   Report Status PENDING  Incomplete         Radiology Studies: CT PELVIS W CONTRAST  Result Date: 08/20/2019 CLINICAL DATA:  Right buttock abscess EXAM: CT PELVIS WITH CONTRAST TECHNIQUE: Multidetector CT  imaging of the pelvis was performed using the standard protocol following the bolus administration of intravenous contrast. CONTRAST:  168mL OMNIPAQUE IOHEXOL 300 MG/ML  SOLN COMPARISON:  07/19/2019 FINDINGS: Urinary Tract:  No abnormality visualized. Bowel:  Unremarkable visualized pelvic bowel loops. Vascular/Lymphatic: Atherosclerosis.  No aneurysm or adenopathy. Reproductive: Poorly visualized due to beam hardening artifact from bilateral hip replacements. Other:  No free fluid or free air. Musculoskeletal: Image quality in the pelvis is degraded due to extensive beam hardening artifact from bilateral hip replacements. There is gas noted throughout the soft tissues surrounding the lower sacrum and coccyx. No visible drainable fluid collection, but the gas throughout the soft tissues is compatible with infection, possibly with gas-forming organism. In addition, there is a rim enhancing low-density area within the right psoas muscle measuring 3.2 x 2.6 cm on axial image 13. On coronal image 76, this extends off the superior aspect of the study. The visualized portion has a craniocaudal length of 5.4 cm. IMPRESSION: Gas within the soft tissue surrounding the lower sacrum and coccyx concerning for infection with gas-forming organism. No visible drainable fluid collection. Evaluation of this area is limited due to beam hardening artifact from bilateral hip replacements. Rim enhancing low-density area within the right psoas muscle which concerning for intramuscular abscess. Full extent extends above the 1st image for this CT pelvis. Electronically Signed   By: Rolm Baptise M.D.   On: 08/20/2019 21:45   DG CHEST PORT 1 VIEW  Result Date: 08/21/2019 CLINICAL DATA:  PICC placement EXAM: PORTABLE CHEST 1 VIEW COMPARISON:  07/24/2019 FINDINGS: Right arm PICC tip in the mid SVC. Lungs are clear without infiltrate effusion or edema. Cardiac enlargement. IMPRESSION: PICC tip in the mid SVC. Electronically Signed   By:  Franchot Gallo M.D.   On: 08/21/2019 16:44   CT IMAGE GUIDED FLUID DRAIN BY CATHETER  Result Date: 08/21/2019 INDICATION: 54 year old male with a persistent right psoas abscess despite prior CT-guided aspiration on 07/25/2019. He presents for CT-guided drain placement. EXAM: CT-guided drain placement MEDICATIONS: The patient is currently admitted to the hospital and receiving intravenous antibiotics. The antibiotics were administered within an appropriate time frame prior to the initiation of the procedure. ANESTHESIA/SEDATION: Fentanyl 100 mcg IV; Versed 2 mg IV Moderate Sedation Time:  20 minutes The patient was continuously monitored during the procedure by the interventional radiology nurse under my direct supervision. COMPLICATIONS: None immediate. PROCEDURE: Informed written consent was obtained from the patient after a thorough discussion of the procedural risks, benefits and alternatives. All questions were addressed. Maximal Sterile Barrier Technique was utilized including caps, mask, sterile gowns, sterile gloves, sterile drape, hand hygiene and skin antiseptic. A timeout was performed prior to the initiation of the procedure. The patient was placed prone on the gantry table. A limited axial CT scan was performed. The low-attenuation collection in the right psoas muscle was successfully identified. A suitable skin entry site was selected and marked. The overlying skin was sterilely prepped and draped in the standard fashion using chlorhexidine skin prep. Local anesthesia was attained by infiltration with 1% lidocaine. A small dermatotomy was made. Under intermittent CT guidance, an 18 gauge trocar needle was carefully advanced along a right paraspinal approach into the fluid collection. A 0.035 wire was then coiled in the fluid collection. The tract was dilated to 10 [place] and a [place] all-purpose drainage catheter was advanced over the wire and formed within the fluid collection. Aspiration  yields 25 mL turbid yellow fluid. Samples were sent for culture. The drainage catheter was secured to the skin with 0 Prolene suture and connected to JP bulb suction. Post drain placement CT imaging demonstrates a well-positioned drainage catheter with significant reduction in the volume of the intra psoas fluid collection. IMPRESSION: Successful placement of a 12 French drainage catheter into the right psoas fluid collection with aspiration of 25 mL turbid yellow fluid. Samples were sent for Gram stain and culture. PLAN: 1. Maintain drain to JP bulb suction. 2. Flush drain with 5-10 mL sterile saline or water once per shift. 3. Once drain output has been minimal (no more than the flush volume) for 48 hours, the drainage catheter can be removed. Signed, Criselda Peaches, MD, [facility] Electronically Signed   By: Jacqulynn Cadet M.D.   On: 08/21/2019 11:59        Scheduled Meds: . Chlorhexidine Gluconate Cloth  6 each Topical Daily  . enoxaparin (LOVENOX) injection  40 mg Subcutaneous Q24H  . feeding supplement  1 Container Oral TID BM  . fentaNYL      . folic acid  1 mg Oral Daily  . midazolam      . multivitamin with minerals  1 tablet Oral Daily  . sodium chloride flush  5 mL Intracatheter Q8H  . tamsulosin  0.4 mg Oral QPC supper  . thiamine  100 mg Oral Daily   Or  . thiamine  100 mg Intravenous Daily   Continuous Infusions: . piperacillin-tazobactam (ZOSYN)  IV 3.375 g (08/21/19 1433)  . vancomycin 1,250 mg (08/21/19 1203)     LOS: 1 day    Time spent: 26 minutes with over 50% of the time coordinating the patient's care    Kevan Ny  Derry Lory, DO [facility] Pager [phone]  If 7PM-7AM, please contact night-coverage [email or web address] Password [facility] 08/21/2019, 6:03 PM

## 2019-08-21 NOTE — TOC Initial Note (Addendum)
Transition of Care [facility]) - Initial/Assessment Note    Patient Details  Name: [name] MRN: [id number] Date of Birth: 03/12/1966  [facility] of [facility]) CM/SW Contact:    Zenon Mayo, RN Phone Number: 08/21/2019, 3:38 PM  Clinical Narrative:                 From home, he is active with [facility]: Lambert Rhonda W for HHPT/HHOT, also he has IV ABX's done by [facility] infusion.  Pam chandler is aware patient is here.  TOC team will continue to follow for dc needs. Will need resumption orders and needs substance abuse resources.  Expected Discharge Plan: [place] Barriers to Discharge: Continued Medical Work up   Patient Goals and CMS Choice Patient states their goals for this hospitalization and ongoing recovery are:: get better CMS Medicare.gov Compare Post Acute Care list provided to:: Patient Choice offered to / list presented to : Patient  Expected Discharge Plan and Services Expected Discharge Plan: [place] In-house Referral: NA Discharge Planning Services: CM Consult Post Acute Care Choice: [place] arrangements for the past 2 months: Single Family Home                 DME Arranged: (NA)         HH Arranged: PT, OT, IV Antibiotics HH Agency: [facility]([place] for iv abx) Date HH Agency Contacted: 08/21/19 Time [place]: 1538 Representative spoke with at [place]: Cory/Pam  Prior Living Arrangements/Services Living arrangements for the past 2 months: [place] with:: Spouse Patient language and need for interpreter reviewed:: Yes Do you feel safe going back to the place where you live?: Yes      Need for Family Participation in Patient Care: Yes (Comment) Care giver support system in place?: Yes (comment) Current home services: Home OT, Home PT, Home RN Criminal Activity/Legal Involvement Pertinent to Current Situation/Hospitalization: No - Comment as needed  Activities of Daily  Living Home Assistive Devices/Equipment: Walker (specify type) ADL Screening (condition at time of admission) Patient's cognitive ability adequate to safely complete daily activities?: Yes Is the patient deaf or have difficulty hearing?: No Does the patient have difficulty seeing, even when wearing glasses/contacts?: No Does the patient have difficulty concentrating, remembering, or making decisions?: No Patient able to express need for assistance with ADLs?: Yes Does the patient have difficulty dressing or bathing?: Yes Independently performs ADLs?: No Communication: Independent Dressing (OT): Needs assistance Is this a change from baseline?: Change from baseline, expected to last >3 days Grooming: Needs assistance Is this a change from baseline?: Change from baseline, expected to last >3 days Feeding: Independent Is this a change from baseline?: Change from baseline, expected to last >3 days Bathing: Needs assistance Is this a change from baseline?: Change from baseline, expected to last >3 days Toileting: Needs assistance Is this a change from baseline?: Change from baseline, expected to last >3days In/Out Bed: Needs assistance Is this a change from baseline?: Change from baseline, expected to last >3 days Walks in Home: Needs assistance Is this a change from baseline?: Change from baseline, expected to last >3 days Does the patient have difficulty walking or climbing stairs?: Yes Weakness of Legs: Both Weakness of Arms/Hands: None  Permission Sought/Granted                  Emotional Assessment   Attitude/Demeanor/Rapport: Engaged Affect (typically observed): Appropriate Orientation: : Oriented to Self, Oriented to  Place, Oriented to  Time, Oriented to Situation Alcohol / Substance Use: Not Applicable Psych Involvement: No (comment)  Admission diagnosis:  Psoas abscess (HCC) [K68.12] Wound infection [T14.8XXA, L08.9] Sepsis ([place]) [A41.9] Patient Active Problem List    Diagnosis Date Noted  . Sepsis ([place]) 08/20/2019  . Infected pressure ulcer 08/20/2019  . Psoas abscess, right ([place]) 08/20/2019  . Hyponatremia 08/20/2019  . Alcohol use 08/20/2019  . Pressure injury of skin 08/02/2019  . Cauda equina syndrome ([place]) 07/31/2019  . Neurogenic bladder 07/31/2019  . Bacteremia due to methicillin susceptible Staphylococcus aureus (MSSA) 07/30/2019  . Lumbar discitis 07/30/2019  . Cerebral septic emboli ([place]) 07/30/2019  . S/P lumbar laminectomy 07/25/2019  . Epidural abscess, L2-L5 07/25/2019  . Polymyalgia rheumatica ([place]) 07/24/2019  . Acute bilateral low back pain without sciatica 07/18/2019  . Abdominal pain 07/18/2019  . Anemia 07/18/2019  . Obesity (BMI 35.0-39.9 without comorbidity) 07/18/2019  . Constipation 07/18/2019  . Bilateral leg pain 07/12/2019  . Bilateral leg edema 04/01/2017  . Elevated blood sugar 04/01/2017  . Avascular necrosis of bone of hip, left ([place]) 10/12/2016  . Alcohol abuse, daily use 10/12/2016  . S/P total hip arthroplasty 10/12/2016   PCP:  Lesleigh Noe, MD Pharmacy:   [id number] - [place], [place] [address] [place] [address] Phone: [phone] Fax: [phone]     Social Determinants of Health (SDOH) Interventions    Readmission Risk Interventions Readmission Risk Prevention Plan 08/21/2019  Transportation Screening Complete  HRI or [place] Complete  Social Work Consult for [place] Planning/Counseling Complete  Palliative Care Screening Not Applicable  Medication Review Press photographer) Complete  Some recent data might be hidden

## 2019-08-21 NOTE — Progress Notes (Signed)
Patient at this time has a PIV and is currently infusing normal saline. Does not need another PIV placed at this time

## 2019-08-21 NOTE — ED Notes (Signed)
This RN attempted to call report, was asked to call back to allow floor RN time to look over chart and settle another pt. Will call back in 13min

## 2019-08-21 NOTE — Procedures (Signed)
Interventional Radiology Procedure Note  Procedure: Placement of 68F drain into RIGHT psoas abscess.  Aspiration yields 25 mL turbid yellow fluid.   Complications: None  Estimated Blood Loss: None  Recommendations: - Drain to JP bulb - Cultures sent - Flush daily with 5 mL   Signed,  Criselda Peaches, MD

## 2019-08-21 NOTE — Evaluation (Signed)
Physical Therapy Evaluation Patient Details Name: [name] MRN: [id number] DOB: 11/18/1965 Today's Date: 08/21/2019   History of Present Illness  [name] is a 54 y.o. male presenting with complaints of drainage from right buttock wound. Patient had a recent hospital admission 12/29-1/6 for lumbar epidural abscess, cauda equina syndrome, severe spinal stenosis L3-4 L4-5 status post decompressive lumbar laminectomy 3-4,4-5 for evacuation of epidural abscess on 12/30 by Dr. Saintclair Halsted and CT aspiration of right psoas abscess per interventional radiology on 12/30.  Found to have MSSA bacteremia. Pt went to CIR and then home on 1/15, returns with drainage and pain in buttocks region.   Clinical Impression  Pt admitted with above diagnosis. Pt has buttocks pain with all movement. Mod A needed with bed mobility and initial sit<>stand. Able to stand with min A after practice and was able to sidestep down to foot of bed and then back up to head after seated rest. Expect that pt will be able to return home and continue HHPT at d/c.   Pt currently with functional limitations due to the deficits listed below (see PT Problem List). Pt will benefit from skilled PT to increase their independence and safety with mobility to allow discharge to the venue listed below.       Follow Up Recommendations Home health PT;Supervision/Assistance - 24 hour    Equipment Recommendations  None recommended by PT    Recommendations for Other Services OT consult     Precautions / Restrictions Precautions Precautions: Fall;Back Precaution Booklet Issued: No Precaution Comments: back precautions from last admission Restrictions Weight Bearing Restrictions: No Other Position/Activity Restrictions: JP drain, PIV      Mobility  Bed Mobility Overal bed mobility: Needs Assistance Bed Mobility: Rolling;Sidelying to Sit;Sit to Sidelying Rolling: Min assist Sidelying to sit: Mod assist   Sit to supine: Mod assist    General bed mobility comments: pt able to bridge knees with specific intructions and roll to L. Mod A for LE's off bed and elevation of trunk into sitting. Mod A to LE's for return to supine.    Transfers Overall transfer level: Needs assistance Equipment used: Rolling walker (2 wheeled) Transfers: Sit to/from Stand Sit to Stand: Min assist;Mod assist         General transfer comment: mod A for power up with first sit<>stand. Min A subsequent stands and need for RW stabilization as pt prefers to push down on RW  Ambulation/Gait Ambulation/Gait assistance: Mod assist Gait Distance (Feet): 8 Feet(4', 4') Assistive device: Rolling walker (2 wheeled) Gait Pattern/deviations: Step-to pattern Gait velocity: decreased Gait velocity interpretation: <1.31 ft/sec, indicative of household ambulator General Gait Details: sidestepped to foot of bed and then back to [facility] when he was ready to return to supine  Stairs            Wheelchair Mobility    Modified Rankin (Stroke Patients Only)       Balance Overall balance assessment: Needs assistance Sitting-balance support: Single extremity supported;Feet supported Sitting balance-Leahy Scale: Fair     Standing balance support: Bilateral upper extremity supported;During functional activity Standing balance-Leahy Scale: Poor Standing balance comment: reliant on external assist and RW                             Pertinent Vitals/Pain Pain Assessment: Faces Faces Pain Scale: Hurts whole lot Pain Location: buttocks Pain Descriptors / Indicators: Grimacing;Guarding;Constant Pain Intervention(s): Monitored during session;Limited activity within patient's tolerance  Home Living Family/patient expects to be discharged to:: Private residence Living Arrangements: Spouse/significant other;Children Available Help at Discharge: Family;Available 24 hours/day Type of Home: House Home Access: Stairs to enter Entrance  Stairs-Rails: None Entrance Stairs-Number of Steps: 2-3 Home Layout: One level Home Equipment: Walker - 2 wheels;Bedside commode Additional Comments: pt had moved into an apt alone prior to medical issues but moved back in with family after recent admission to rehab and plans to go back with them until he can return to his place    Prior Function Level of Independence: Needs assistance   Gait / Transfers Assistance Needed: was ambulating with RW and doing stairs on CIR before going home  ADL's / Homemaking Assistance Needed: assist needed currently        Hand Dominance   Dominant Hand: Right    Extremity/Trunk Assessment   Upper Extremity Assessment Upper Extremity Assessment: Defer to OT evaluation    Lower Extremity Assessment Lower Extremity Assessment: Generalized weakness;RLE deficits/detail RLE Deficits / Details: noted R foot inversion in standing, strength grossly 3-/5 throughout RLE Sensation: decreased proprioception RLE Coordination: decreased gross motor    Cervical / Trunk Assessment Cervical / Trunk Assessment: Normal  Communication   Communication: No difficulties  Cognition Arousal/Alertness: Awake/alert Behavior During Therapy: Flat affect Overall Cognitive Status: No family/caregiver present to determine baseline cognitive functioning Area of Impairment: Problem solving                     Memory: Decreased short-term memory Following Commands: Follows multi-step commands with increased time     Problem Solving: Slow processing;Requires verbal cues General Comments: pt appropriate during session but mild deficits noted such as he kept saying he was waiting for his groceries when he was waiting for his lunch tray. He knew he had spoken with his RN re something about therapy but he couldn't remember what      General Comments General comments (skin integrity, edema, etc.): bed placed in modified chair (he could not tolerate full upright)  after session for pt to eat    Exercises     Assessment/Plan    PT Assessment Patient needs continued PT services  PT Problem List Decreased strength;Decreased mobility;Decreased activity tolerance;Decreased balance;Decreased safety awareness;Decreased cognition;Decreased knowledge of use of DME;Pain       PT Treatment Interventions DME instruction;Gait training;Functional mobility training;Therapeutic activities;Therapeutic exercise;Balance training;Patient/family education    PT Goals (Current goals can be found in the Care Plan section)  Acute Rehab PT Goals Patient Stated Goal: to be in less pain PT Goal Formulation: With patient Time For Goal Achievement: 09/04/19 Potential to Achieve Goals: Good    Frequency Min 3X/week   Barriers to discharge        Co-evaluation               AM-PAC PT "6 Clicks" Mobility  Outcome Measure Help needed turning from your back to your side while in a flat bed without using bedrails?: A Little Help needed moving from lying on your back to sitting on the side of a flat bed without using bedrails?: A Little Help needed moving to and from a bed to a chair (including a wheelchair)?: A Little Help needed standing up from a chair using your arms (e.g., wheelchair or bedside chair)?: A Little Help needed to walk in hospital room?: A Lot Help needed climbing 3-5 steps with a railing? : A Lot 6 Click Score: 16    End of Session Equipment Utilized  During Treatment: Gait belt Activity Tolerance: Patient tolerated treatment well Patient left: in bed;with call bell/phone within reach;with bed alarm set Nurse Communication: Mobility status PT Visit Diagnosis: Muscle weakness (generalized) (M62.81);Difficulty in walking, not elsewhere classified (R26.2);Pain;Unsteadiness on feet (R26.81);Other symptoms and signs involving the nervous system (R29.898) Pain - part of body: (buttocks)    Time: [id number] PT Time Calculation (min) (ACUTE ONLY):  33 min   Charges:   PT Evaluation $PT Eval Moderate Complexity: 1 Mod PT Treatments $Gait Training: 8-22 mins        [place]  Pager [phone] Office [place] 08/21/2019, 2:22 PM

## 2019-08-22 ENCOUNTER — Encounter: Payer: BC Managed Care – PPO | Admitting: Physical Medicine and Rehabilitation

## 2019-08-22 ENCOUNTER — Telehealth: Payer: Self-pay | Admitting: Family Medicine

## 2019-08-22 DIAGNOSIS — Z7289 Other problems related to lifestyle: Secondary | ICD-10-CM

## 2019-08-22 DIAGNOSIS — E871 Hypo-osmolality and hyponatremia: Secondary | ICD-10-CM

## 2019-08-22 DIAGNOSIS — R7881 Bacteremia: Secondary | ICD-10-CM

## 2019-08-22 DIAGNOSIS — E876 Hypokalemia: Secondary | ICD-10-CM

## 2019-08-22 DIAGNOSIS — M48061 Spinal stenosis, lumbar region without neurogenic claudication: Secondary | ICD-10-CM

## 2019-08-22 DIAGNOSIS — R29898 Other symptoms and signs involving the musculoskeletal system: Secondary | ICD-10-CM

## 2019-08-22 DIAGNOSIS — M4646 Discitis, unspecified, lumbar region: Secondary | ICD-10-CM | POA: Diagnosis not present

## 2019-08-22 DIAGNOSIS — D649 Anemia, unspecified: Secondary | ICD-10-CM

## 2019-08-22 LAB — BASIC METABOLIC PANEL
Anion gap: 10 (ref 5–15)
BUN: <5 mg/dL — ABNORMAL LOW (ref 6–20)
CO2: 24 mmol/L (ref 22–32)
Calcium: 8.5 mg/dL — ABNORMAL LOW (ref 8.9–10.3)
Chloride: 101 mmol/L (ref 98–111)
Creatinine, Ser: 0.68 mg/dL (ref 0.61–1.24)
GFR calc Af Amer: >60 mL/min (ref >60)
GFR calc non Af Amer: >60 mL/min (ref >60)
Glucose, Bld: 103 mg/dL — ABNORMAL HIGH (ref 70–99)
Potassium: 3.3 mmol/L — ABNORMAL LOW (ref 3.5–5.1)
Sodium: 135 mmol/L (ref 135–145)

## 2019-08-22 LAB — URINALYSIS, ROUTINE W REFLEX MICROSCOPIC
Bilirubin Urine: NEGATIVE
Glucose, UA: NEGATIVE mg/dL
Ketones, ur: NEGATIVE mg/dL
Leukocytes,Ua: NEGATIVE
Nitrite: NEGATIVE
Protein, ur: NEGATIVE mg/dL
Specific Gravity, Urine: 1.014 (ref 1.005–1.030)
pH: 5 (ref 5.0–8.0)

## 2019-08-22 LAB — CBC
HCT: 27.3 % — ABNORMAL LOW (ref 39.0–52.0)
Hemoglobin: 8.8 g/dL — ABNORMAL LOW (ref 13.0–17.0)
MCH: 30.7 pg (ref 26.0–34.0)
MCHC: 32.2 g/dL (ref 30.0–36.0)
MCV: 95.1 fL (ref 80.0–100.0)
Platelets: 427 10*3/uL — ABNORMAL HIGH (ref 150–400)
RBC: 2.87 MIL/uL — ABNORMAL LOW (ref 4.22–5.81)
RDW: 14 % (ref 11.5–15.5)
WBC: 10.2 10*3/uL (ref 4.0–10.5)
nRBC: 0 % (ref 0.0–0.2)

## 2019-08-22 LAB — MAGNESIUM: Magnesium: 1.9 mg/dL (ref 1.7–2.4)

## 2019-08-22 MED ORDER — SODIUM CHLORIDE 0.9% FLUSH
10.0000 mL | INTRAVENOUS | Status: DC | PRN
  Administered 2019-08-28: 10 mL

## 2019-08-22 MED ORDER — SODIUM CHLORIDE 0.9% FLUSH
10.0000 mL | Freq: Two times a day (BID) | INTRAVENOUS | Status: DC
  Administered 2019-08-23 – 2019-08-25 (×5): 10 mL
  Administered 2019-08-26: 22:00:00 20 mL
  Administered 2019-08-27 – 2019-08-28 (×2): 10 mL

## 2019-08-22 MED ORDER — POTASSIUM CHLORIDE CRYS ER 20 MEQ PO TBCR
40.0000 meq | EXTENDED_RELEASE_TABLET | ORAL | Status: AC
  Administered 2019-08-22 (×2): 40 meq via ORAL
  Filled 2019-08-22 (×2): qty 2

## 2019-08-22 NOTE — Telephone Encounter (Signed)
FMLA paperwork in Dr Verda Cumins in box  3 FMLA's   One for pt one for daughter one for son.    All 3 are in orange folder

## 2019-08-22 NOTE — Progress Notes (Signed)
Physical Therapy Treatment Patient Details Name: [name] MRN: [id number] DOB: 07-19-1966 Today's Date: 08/22/2019    History of Present Illness [name] is a 54 y.o. male presenting with complaints of drainage from right buttock wound. Patient had a recent hospital admission 12/29-1/6 for lumbar epidural abscess, cauda equina syndrome, severe spinal stenosis L3-4 L4-5 status post decompressive lumbar laminectomy 3-4,4-5 for evacuation of epidural abscess on 12/30 by Dr. Saintclair Halsted and CT aspiration of right psoas abscess per interventional radiology on 12/30.  Found to have MSSA bacteremia. Pt went to CIR and then home on 1/15, returns with drainage and pain in buttocks region.     PT Comments    Patient is making progress toward PT goals and tolerated increased mobility well. Current plan remains appropriate.    Follow Up Recommendations  Home health PT;Supervision/Assistance - 24 hour     Equipment Recommendations  None recommended by PT    Recommendations for Other Services       Precautions / Restrictions Precautions Precautions: Fall;Back Precaution Comments: pt recalls back precautions from previous admission Restrictions Weight Bearing Restrictions: No Other Position/Activity Restrictions: JP drain    Mobility  Bed Mobility Overal bed mobility: Needs Assistance Bed Mobility: Sidelying to Sit   Sidelying to sit: Min guard       General bed mobility comments: HOB up, heavy reliance on rail, min guard as pt was close to EOB  Transfers Overall transfer level: Needs assistance Equipment used: Rolling walker (2 wheeled) Transfers: Sit to/from Stand Sit to Stand: Min guard         General transfer comment: min guard for safety from elevated bed  Ambulation/Gait Ambulation/Gait assistance: Min guard Gait Distance (Feet): 75 Feet Assistive device: Rolling walker (2 wheeled) Gait Pattern/deviations: Step-through pattern;Decreased stride length;Trunk  flexed Gait velocity: decreased   General Gait Details: grossly steady gait; a little impulsive so min guard for safety; no physical assist needed   Stairs             Wheelchair Mobility    Modified Rankin (Stroke Patients Only)       Balance Overall balance assessment: Needs assistance Sitting-balance support: Single extremity supported;Feet supported Sitting balance-Leahy Scale: Good     Standing balance support: Bilateral upper extremity supported;During functional activity Standing balance-Leahy Scale: Poor Standing balance comment: reliant on at least one hand support in static standing, 2 for dynamic                            Cognition Arousal/Alertness: Awake/alert Behavior During Therapy: Impulsive Overall Cognitive Status: No family/caregiver present to determine baseline cognitive functioning Area of Impairment: Safety/judgement                               General Comments: pt with confusing phone with call button and pressing impulsively      Exercises      General Comments        Pertinent Vitals/Pain Pain Assessment: Faces Faces Pain Scale: Hurts whole lot Pain Location: buttocks Pain Descriptors / Indicators: Grimacing;Guarding;Constant Pain Intervention(s): RN gave pain meds during session    Home Living Family/patient expects to be discharged to:: Private residence Living Arrangements: Spouse/significant other;Children Available Help at Discharge: Family;Available 24 hours/day Type of Home: House Home Access: Stairs to enter Entrance Stairs-Rails: None Home Layout: One level Home Equipment: Environmental consultant - 2 wheels;Bedside commode;Adaptive equipment Additional  Comments: pt had moved into an apt alone prior to medical issues but moved back in with family after recent admission to rehab and plans to go back with them until he can return to his place    Prior Function Level of Independence: Needs assistance  Gait /  Transfers Assistance Needed: was ambulating with RW and doing stairs on CIR before going home ADL's / Homemaking Assistance Needed: has not been able to shower, was functioning modified independently with AE since discharge from CIR     PT Goals (current goals can now be found in the care plan section) Acute Rehab PT Goals 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 Progress towards PT goals: Progressing toward goals    Frequency    Min 3X/week      PT Plan Current plan remains appropriate    Co-evaluation PT/OT/SLP Co-Evaluation/Treatment: Yes Reason for Co-Treatment: For patient/therapist safety PT goals addressed during session: Mobility/safety with mobility OT goals addressed during session: ADL's and self-care;Proper use of Adaptive equipment and DME      AM-PAC PT "6 Clicks" Mobility   Outcome Measure  Help needed turning from your back to your side while in a flat bed without using bedrails?: A Little Help needed moving from lying on your back to sitting on the side of a flat bed without using bedrails?: A Little Help needed moving to and from a bed to a chair (including a wheelchair)?: A Little Help needed standing up from a chair using your arms (e.g., wheelchair or bedside chair)?: A Little Help needed to walk in hospital room?: A Little Help needed climbing 3-5 steps with a railing? : A Lot 6 Click Score: 17    End of Session   Activity Tolerance: Patient tolerated treatment well Patient left: in chair;with call bell/phone within reach;with family/visitor present Nurse Communication: Mobility status PT Visit Diagnosis: Muscle weakness (generalized) (M62.81);Difficulty in walking, not elsewhere classified (R26.2);Pain;Unsteadiness on feet (R26.81);Other symptoms and signs involving the nervous system (R29.898) Pain - part of body: (buttocks)     Time: [id number] PT Time Calculation (min) (ACUTE ONLY): 33 min  Charges:  $Gait Training: 8-22 mins                      Earney Navy, PTA Acute Rehabilitation Services Pager: [phone] Office: [phone]     Darliss Cheney 08/22/2019, 11:43 AM

## 2019-08-22 NOTE — Progress Notes (Signed)
PROGRESS NOTE  [name] [id number] DOB: 08/25/1965   PCP: Lesleigh Noe, MD  Patient is from: home  DOA: 08/20/2019 LOS: 2  Brief Narrative / Interim history: This is a 54 year old male with recent hospital admission from 12/29-1/6 for lumbar epidural abscess, cauda equina syndrome, severe spinal stenosis status post decompressive lumbar laminectomy L3-4, 4-5 for evacuation of epidural abscess on 12/30 and CT aspiration of right psoas abscess on 12/30 found to have MSSA bacteremia started on nafcillin for 6 weeks via PICC line planned through 2/9 per ID who was in CIR and discharged 1/15 and noticed pain and drainage of buttock area, febrile in the ED and tachycardic leukocytosis.  Started on vancomycin/Zosyn, IV fluids and found to have right psoas muscle intramuscular abscess.  General surgery and IR have been consulted.  General surgery was able to debride large amount of material at bedside on 1/25. IR guided right psoas muscle abscess drainage on 1/26.  Subjective: No major events overnight or this morning.  Was uncomfortable lying in bed.  Reports pain in his back.  Reports gradual improvement in his lower extremity weaknesses.  He denies numbness or tingling.  Denies bowel or bladder issue.  Denies chest pain, dyspnea, GI or UTI symptoms.  Objective: Vitals:   08/22/19 0430 08/22/19 0626 08/22/19 0724 08/22/19 1202  BP: 116/68  (!) 100/54 112/67  Pulse: 84  84 91  Resp: 18   18  Temp: 99.4 F (37.4 C)  98.9 F (37.2 C) 98 F (36.7 C)  TempSrc: Oral  Oral Oral  SpO2: 95%  98% 97%  Weight:  108.4 kg    Height:        Intake/Output Summary (Last 24 hours) at 08/22/2019 1515 Last data filed at 08/22/2019 0900 Gross per 24 hour  Intake 684.56 ml  Output 1000 ml  Net -315.44 ml   Filed Weights   08/20/19 2111 08/21/19 0115 08/22/19 0626  Weight: 108.9 kg 104.4 kg 108.4 kg    Examination:  GENERAL: No acute distress.  Appears well.  HEENT: MMM.  Vision and  hearing grossly intact.  NECK: Supple.  No apparent JVD.  RESP:  No IWOB. Good air movement bilaterally. CVS:  RRR. Heart sounds normal.  ABD/GI/GU: Bowel sounds present. Soft. Non tender.  MSK/EXT:  Moves extremities. No apparent deformity. No edema.  Drain in place. SKIN: no apparent skin lesion or wound NEURO: Awake, alert and oriented appropriately.  Motor 3/5 in both lower extremities with hip flexion. PSYCH: Calm. Normal affect.  Procedures:  1/25-general surgery was able to debride large amount of material at bedside.  1/26-IR guided right psoas muscle abscess drainage on 1/26.  Assessment & Plan: Sepsis secondary to cellulitis, sacral pressure ulcer superinfection and right psoas intramuscular abscess.  Surgical debridement and IR drainage with drain placement as above.  Sepsis physiology resolved.  MRSA PCR, blood and tissue cultures negative so far.  Was on nafcillin for MSSA bacteremia since 07/25/2019. -General surgery and IR following. -Continue vancomycin and Zosyn -Pain control with bowel regimen.  MSSA bacteremia: Was on nafcillin since 07/25/2019, for a total of 6 weeks.  Now on vancomycin and Zosyn. -We will discuss with infectious disease.  Acute toxic metabolic encephalopathy likely combination of medications and sepsis as above. -Minimize sedating medications -Treat treatable causes as above. -Reorientation and delirium precautions.  Lumbar epidural abscess/cauda equina syndrome with severe spinal stenosis at L3-L4 and L4-L5: Had decompressive lumbar laminectomies L3-4, L4-5 with partial medial facetectomies and foraminotomies  of L3-4 and 5 on 07/25/2019 by Dr. Saintclair Halsted.  Has bilateral lower extremity weakness.  Denies bowel or bladder issue now. -PT/OT.  Normocytic anemia: Baseline Hgb 10-11> 10.7 (admit)>> 8.8.  Likely some element of hemodilution, blood draws and surgical bleed -Continue monitoring -Check anemia panel  Hypokalemia -Replenish and  recheck.  Alcohol use disorder -Encourage cessation -CIWA with Ativan -Vitamins  Hyponatremia: Resolved. -Continue monitoring  Class II obesity Body mass index is 31.53 kg/m. -Encourage lifestyle change to lose weight.    Pressure injury Pressure Injury 07/31/19 Buttocks Medial Stage 2 -  Partial thickness loss of dermis presenting as a shallow open injury with a red, pink wound bed without slough. (Active)  07/31/19 1640  Location: Buttocks  Location Orientation: Medial  Staging: Stage 2 -  Partial thickness loss of dermis presenting as a shallow open injury with a red, pink wound bed without slough.  Wound Description (Comments):   Present on Admission: Yes     Pressure Injury 07/31/19 Buttocks Right;Lower Stage 2 -  Partial thickness loss of dermis presenting as a shallow open injury with a red, pink wound bed without slough. (Active)  07/31/19 1644  Location: Buttocks  Location Orientation: Right;Lower  Staging: Stage 2 -  Partial thickness loss of dermis presenting as a shallow open injury with a red, pink wound bed without slough.  Wound Description (Comments):   Present on Admission: Yes     Pressure Injury 07/31/19 Buttocks Right;Upper Stage 2 -  Partial thickness loss of dermis presenting as a shallow open injury with a red, pink wound bed without slough. (Active)  07/31/19 1646  Location: Buttocks  Location Orientation: Right;Upper  Staging: Stage 2 -  Partial thickness loss of dermis presenting as a shallow open injury with a red, pink wound bed without slough.  Wound Description (Comments):   Present on Admission: Yes     Pressure Injury 08/21/19 Medial (Active)  08/21/19 0200  Location:   Location Orientation: Medial  Staging:   Wound Description (Comments):   Present on Admission: Yes     Pressure Injury 08/21/19 Sacrum Unstageable - Full thickness tissue loss in which the base of the injury is covered by slough (yellow, tan, gray, green or brown)  and/or eschar (tan, brown or black) in the wound bed. (Active)  08/21/19 2015  Location: Sacrum  Location Orientation:   Staging: Unstageable - Full thickness tissue loss in which the base of the injury is covered by slough (yellow, tan, gray, green or brown) and/or eschar (tan, brown or black) in the wound bed.  Wound Description (Comments):   Present on Admission: Yes     Pressure Injury 08/21/19 Buttocks Right Unstageable - Full thickness tissue loss in which the base of the injury is covered by slough (yellow, tan, gray, green or brown) and/or eschar (tan, brown or black) in the wound bed. (Active)  08/21/19 2015  Location: Buttocks  Location Orientation: Right  Staging: Unstageable - Full thickness tissue loss in which the base of the injury is covered by slough (yellow, tan, gray, green or brown) and/or eschar (tan, brown or black) in the wound bed.  Wound Description (Comments):   Present on Admission: Yes     Nutrition Problem: Increased nutrient needs Etiology: wound healing  Signs/Symptoms: estimated needs  Interventions: MVI, Boost Breeze, Magic cup, Refer to RD note for recommendations   DVT prophylaxis: Subcu Lovenox Code Status: Full code Family Communication: Patient and/or RN. Available if any question. Disposition Plan: Remains inpatient  for treatment of sepsis due to cellulitis/abscess Consultants: General surgery, IR,   Microbiology summarized: 1/25-COVID-19 influenza PCR negative. 1/26-MRSA PCR negative. 1/25-blood cultures negative. 1/26-abscess cultures negative so far.  Sch Meds:  Scheduled Meds: . Chlorhexidine Gluconate Cloth  6 each Topical Daily  . enoxaparin (LOVENOX) injection  40 mg Subcutaneous Q24H  . feeding supplement  1 Container Oral TID BM  . folic acid  1 mg Oral Daily  . multivitamin with minerals  1 tablet Oral Daily  . sodium chloride flush  10-40 mL Intracatheter Q12H  . sodium chloride flush  5 mL Intracatheter Q8H  .  tamsulosin  0.4 mg Oral QPC supper  . thiamine  100 mg Oral Daily   Or  . thiamine  100 mg Intravenous Daily   Continuous Infusions: . piperacillin-tazobactam (ZOSYN)  IV 3.375 g (08/22/19 1104)  . vancomycin 1,250 mg (08/22/19 1100)   PRN Meds:.acetaminophen **OR** acetaminophen, cyclobenzaprine, LORazepam **OR** LORazepam, morphine injection, sodium chloride flush  Antimicrobials: Anti-infectives (From admission, onward)   Start     Dose/Rate Route Frequency Ordered Stop   08/21/19 0900  vancomycin (VANCOREADY) IVPB 1250 mg/250 mL     1,250 mg 166.7 mL/hr over 90 Minutes Intravenous Every 12 hours 08/20/19 2114     08/21/19 0400  piperacillin-tazobactam (ZOSYN) IVPB 3.375 g     3.375 g 12.5 mL/hr over 240 Minutes Intravenous Every 8 hours 08/20/19 2314     08/20/19 2100  vancomycin (VANCOCIN) 2,500 mg in sodium chloride 0.9 % 500 mL IVPB     2,500 mg 250 mL/hr over 120 Minutes Intravenous  Once 08/20/19 2059 08/20/19 2321   08/20/19 2045  piperacillin-tazobactam (ZOSYN) IVPB 3.375 g     3.375 g 100 mL/hr over 30 Minutes Intravenous  Once 08/20/19 2037 08/20/19 2130       I have personally reviewed the following labs and images: CBC: Recent Labs  Lab 08/20/19 2016 08/21/19 0714 08/21/19 1915 08/22/19 0500  WBC 14.8* 11.6* 11.3* 10.2  NEUTROABS 12.8*  --   --   --   HGB 10.7* 9.2* 9.3* 8.8*  HCT 32.4* 28.6* 28.5* 27.3*  MCV 93.4 93.8 95.3 95.1  PLT 512* 435* 458* 427*   BMP &GFR Recent Labs  Lab 08/20/19 2016 08/21/19 0714 08/21/19 1626 08/21/19 1915 08/22/19 0500  NA 131* 135 136  --  135  K 3.6 3.6 3.9  --  3.3*  CL 96* 101 103  --  101  CO2 22 24 22   --  24  GLUCOSE 139* 110* 106*  --  103*  BUN 9 7 7   --  <5*  CREATININE 0.93 0.76 0.80 0.84 0.68  CALCIUM 8.8* 8.5* 8.4*  --  8.5*  MG  --  1.8  --   --  1.9  PHOS  --  4.3  --   --   --    Estimated Creatinine Clearance: 136.3 mL/min (by C-G formula based on SCr of 0.68 mg/dL). Liver &  Pancreas: Recent Labs  Lab 08/20/19 2016 08/21/19 1626  AST 19 13*  ALT 17 13  ALKPHOS 61 51  BILITOT 1.2 1.0  PROT 7.6 6.4*  ALBUMIN 2.1* 1.8*   No results for input(s): LIPASE, AMYLASE in the last 168 hours. No results for input(s): AMMONIA in the last 168 hours. Diabetic: No results for input(s): HGBA1C in the last 72 hours. No results for input(s): GLUCAP in the last 168 hours. Cardiac Enzymes: No results for input(s): CKTOTAL, CKMB, CKMBINDEX, TROPONINI  in the last 168 hours. No results for input(s): PROBNP in the last 8760 hours. Coagulation Profile: Recent Labs  Lab 08/20/19 2057  INR 1.5*   Thyroid Function Tests: No results for input(s): TSH, T4TOTAL, FREET4, T3FREE, THYROIDAB in the last 72 hours. Lipid Profile: No results for input(s): CHOL, HDL, LDLCALC, TRIG, CHOLHDL, LDLDIRECT in the last 72 hours. Anemia Panel: No results for input(s): VITAMINB12, FOLATE, FERRITIN, TIBC, IRON, RETICCTPCT in the last 72 hours. Urine analysis:    Component Value Date/Time   COLORURINE YELLOW 08/22/2019 1216   APPEARANCEUR CLEAR 08/22/2019 1216   LABSPEC 1.014 08/22/2019 1216   PHURINE 5.0 08/22/2019 1216   GLUCOSEU NEGATIVE 08/22/2019 1216   HGBUR SMALL (A) 08/22/2019 1216   BILIRUBINUR NEGATIVE 08/22/2019 1216   KETONESUR NEGATIVE 08/22/2019 1216   PROTEINUR NEGATIVE 08/22/2019 1216   UROBILINOGEN 0.2 01/09/2013 1046   NITRITE NEGATIVE 08/22/2019 1216   LEUKOCYTESUR NEGATIVE 08/22/2019 1216   Sepsis Labs: Invalid input(s): PROCALCITONIN, [place]  Microbiology: Recent Results (from the past 240 hour(s))  Blood culture (routine x 2)     Status: None (Preliminary result)   Collection Time: 08/20/19  8:15 PM   Specimen: BLOOD LEFT ARM  Result Value Ref Range Status   Specimen Description BLOOD LEFT ARM  Final   Special Requests   Final    BOTTLES DRAWN AEROBIC AND ANAEROBIC Blood Culture adequate volume   Culture   Final    NO GROWTH 2 DAYS Performed at  [facility], [address], [place], [place] [address]    Report Status PENDING  Incomplete  Blood culture (routine x 2)     Status: None (Preliminary result)   Collection Time: 08/20/19  8:30 PM   Specimen: BLOOD LEFT ARM  Result Value Ref Range Status   Specimen Description BLOOD LEFT ARM  Final   Special Requests   Final    BOTTLES DRAWN AEROBIC ONLY Blood Culture results may not be optimal due to an excessive volume of blood received in culture bottles   Culture   Final    NO GROWTH 2 DAYS Performed at [facility], [place] [address], [place], [place] [address]    Report Status PENDING  Incomplete  Respiratory Panel by RT PCR (Flu A&B, Covid) - Nasopharyngeal Swab     Status: None   Collection Time: 08/20/19 10:51 PM   Specimen: Nasopharyngeal Swab  Result Value Ref Range Status   SARS Coronavirus 2 by RT PCR NEGATIVE NEGATIVE Final    Comment: (NOTE) SARS-CoV-2 target nucleic acids are NOT DETECTED. The SARS-CoV-2 RNA is generally detectable in upper respiratoy specimens during the acute phase of infection. The lowest concentration of SARS-CoV-2 viral copies this assay can detect is 131 copies/mL. A negative result does not preclude SARS-Cov-2 infection and should not be used as the sole basis for treatment or other patient management decisions. A negative result may occur with  improper specimen collection/handling, submission of specimen other than nasopharyngeal swab, presence of viral mutation(s) within the areas targeted by this assay, and inadequate number of viral copies (<131 copies/mL). A negative result must be combined with clinical observations, patient history, and epidemiological information. The expected result is Negative. Fact Sheet for Patients:  PinkCheek.be Fact Sheet for Healthcare Providers:  GravelBags.it This test is not yet ap proved or cleared by the [place] FDA and  has  been authorized for detection and/or diagnosis of SARS-CoV-2 by FDA under an Emergency Use Authorization (EUA). This EUA  will remain  in effect (meaning this test can be used) for the duration of the COVID-19 declaration under Section 564(b)(1) of the Act, 21 U.S.C. section 360bbb-3(b)(1), unless the authorization is terminated or revoked sooner.    Influenza A by PCR NEGATIVE NEGATIVE Final   Influenza B by PCR NEGATIVE NEGATIVE Final    Comment: (NOTE) The Xpert Xpress SARS-CoV-2/FLU/RSV assay is intended as an aid in  the diagnosis of influenza from Nasopharyngeal swab specimens and  should not be used as a sole basis for treatment. Nasal washings and  aspirates are unacceptable for Xpert Xpress SARS-CoV-2/FLU/RSV  testing. Fact Sheet for Patients: PinkCheek.be Fact Sheet for Healthcare Providers: GravelBags.it This test is not yet approved or cleared by the [place] FDA and  has been authorized for detection and/or diagnosis of SARS-CoV-2 by  FDA under an Emergency Use Authorization (EUA). This EUA will remain  in effect (meaning this test can be used) for the duration of the  Covid-19 declaration under Section 564(b)(1) of the Act, 21  U.S.C. section 360bbb-3(b)(1), unless the authorization is  terminated or revoked. Performed at [facility], [place] [address], [place], [place] [address]   MRSA PCR Screening     Status: None   Collection Time: 08/21/19  5:20 AM   Specimen: Nasopharyngeal  Result Value Ref Range Status   MRSA by PCR NEGATIVE NEGATIVE Final    Comment:        The GeneXpert MRSA Assay (FDA approved for NASAL specimens only), is one component of a comprehensive MRSA colonization surveillance program. It is not intended to diagnose MRSA infection nor to guide or monitor treatment for MRSA infections. Performed at [facility], [place] [address], [place], [place] [address]    Aerobic/Anaerobic Culture (surgical/deep wound)     Status: None (Preliminary result)   Collection Time: 08/21/19 11:22 AM   Specimen: Abscess  Result Value Ref Range Status   Specimen Description ABSCESS  Final   Special Requests Normal  Final   Gram Stain   Final    MODERATE WBC PRESENT, PREDOMINANTLY MONONUCLEAR NO ORGANISMS SEEN    Culture   Final    NO GROWTH < 24 HOURS Performed at [facility], [place] [address], [place], [place] [address]    Report Status PENDING  Incomplete    Radiology Studies: DG CHEST PORT 1 VIEW  Result Date: 08/21/2019 CLINICAL DATA:  PICC placement EXAM: PORTABLE CHEST 1 VIEW COMPARISON:  07/24/2019 FINDINGS: Right arm PICC tip in the mid SVC. Lungs are clear without infiltrate effusion or edema. Cardiac enlargement. IMPRESSION: PICC tip in the mid SVC. Electronically Signed   By: Franchot Gallo M.D.   On: 08/21/2019 16:44     Enedina Pair T. Golva  If 7PM-7AM, please contact night-coverage [email or web address] Password [facility] 08/22/2019, 3:15 PM

## 2019-08-22 NOTE — Evaluation (Signed)
Occupational Therapy Evaluation Patient Details Name: [name] MRN: [id number] DOB: 11/28/1965 Today's Date: 08/22/2019    History of Present Illness [name] is a 54 y.o. male presenting with complaints of drainage from right buttock wound. Patient had a recent hospital admission 12/29-1/6 for lumbar epidural abscess, cauda equina syndrome, severe spinal stenosis L3-4 L4-5 status post decompressive lumbar laminectomy 3-4,4-5 for evacuation of epidural abscess on 12/30 by Dr. Saintclair Halsted and CT aspiration of right psoas abscess per interventional radiology on 12/30.  Found to have MSSA bacteremia. Pt went to CIR and then home on 1/15, returns with drainage and pain in buttocks region.    Clinical Impression   Pt has been ambulating with a RW and using AE to complete LB ADL since discharge from CIR. He reports staying in one place once he is able to find a comfortable position when he is at home. Pt currently demonstrates impulsivity at times and impaired cognition. He requires min guard assist for all mobility and set up to min guard assist for ADL. Will follow acutely. Recommending HHOT to address IADL.     Follow Up Recommendations  Home health OT;Supervision/Assistance - 24 hour    Equipment Recommendations  None recommended by OT    Recommendations for Other Services       Precautions / Restrictions Precautions Precautions: Fall;Back Precaution Comments: pt recalls back precautions from previous admission Restrictions Weight Bearing Restrictions: No Other Position/Activity Restrictions: JP drain      Mobility Bed Mobility Overal bed mobility: Needs Assistance Bed Mobility: Sidelying to Sit   Sidelying to sit: Min guard       General bed mobility comments: HOB up, heavy reliance on rail, min guard as pt was close to EOB  Transfers Overall transfer level: Needs assistance Equipment used: Rolling walker (2 wheeled) Transfers: Sit to/from Stand Sit to Stand: Min  guard         General transfer comment: min guard for safety from elevated bed    Balance Overall balance assessment: Needs assistance Sitting-balance support: Single extremity supported;Feet supported Sitting balance-Leahy Scale: Good     Standing balance support: Bilateral upper extremity supported;During functional activity Standing balance-Leahy Scale: Poor Standing balance comment: reliant on at least one hand support in static standing, 2 for dynamic                           ADL either performed or assessed with clinical judgement   ADL Overall ADL's : Needs assistance/impaired Eating/Feeding: Independent   Grooming: Set up;Sitting   Upper Body Bathing: Set up;Sitting;With adaptive equipment   Lower Body Bathing: Min guard;With adaptive equipment;Sit to/from stand   Upper Body Dressing : Set up;Sitting   Lower Body Dressing: Min guard;Sitting/lateral leans;With adaptive equipment   Toilet Transfer: Min guard;Ambulation;RW           Functional mobility during ADLs: Min guard;Rolling walker       Vision Baseline Vision/History: Wears glasses Wears Glasses: At all times Patient Visual Report: No change from baseline       Perception     Praxis      Pertinent Vitals/Pain Pain Assessment: Faces Faces Pain Scale: Hurts whole lot Pain Location: buttocks Pain Descriptors / Indicators: Grimacing;Guarding;Constant Pain Intervention(s): RN gave pain meds during session     Hand Dominance Right   Extremity/Trunk Assessment Upper Extremity Assessment Upper Extremity Assessment: Overall WFL for tasks assessed   Lower Extremity Assessment Lower Extremity Assessment:  Defer to PT evaluation   Cervical / Trunk Assessment Cervical / Trunk Exceptions: s/p lumbar sxs   Communication Communication Communication: No difficulties   Cognition Arousal/Alertness: Awake/alert Behavior During Therapy: Impulsive Overall Cognitive Status: No  family/caregiver present to determine baseline cognitive functioning Area of Impairment: Safety/judgement                               General Comments: pt with confusing phone with call button and pressing impulsively   General Comments       Exercises     Shoulder Instructions      Home Living Family/patient expects to be discharged to:: Private residence Living Arrangements: Spouse/significant other;Children Available Help at Discharge: Family;Available 24 hours/day Type of Home: House Home Access: Stairs to enter [employer] of Steps: 2-3 Entrance Stairs-Rails: None Home Layout: One level     Bathroom Shower/Tub: Tub/shower unit;Walk-in shower;Door   [employer] Toilet: Standard     Home Equipment: Environmental consultant - 2 wheels;Bedside commode;Adaptive equipment Adaptive Equipment: Reacher;Sock aid;Long-handled shoe horn;Long-handled sponge Additional Comments: pt had moved into an apt alone prior to medical issues but moved back in with family after recent admission to rehab and plans to go back with them until he can return to his place      Prior Functioning/Environment Level of Independence: Needs assistance  Gait / Transfers Assistance Needed: was ambulating with RW and doing stairs on CIR before going home ADL's / Homemaking Assistance Needed: has not been able to shower, was functioning modified independently with AE since discharge from CIR            OT Problem List: Decreased strength;Decreased activity tolerance;Impaired balance (sitting and/or standing);Decreased safety awareness;Pain;Decreased cognition;Decreased knowledge of use of DME or AE;Decreased knowledge of precautions      OT Treatment/Interventions: Self-care/ADL training;Energy conservation;DME and/or AE instruction;Therapeutic activities;Patient/family education;Balance training;Cognitive remediation/compensation    OT Goals(Current goals can be found in the care plan section)  Acute Rehab OT Goals 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 OT Goal Formulation: With patient Time For Goal Achievement: 09/05/19 Potential to Achieve Goals: Good ADL Goals Pt Will Perform Grooming: with supervision;standing Pt Will Transfer to Toilet: with supervision;ambulating;bedside commode(over toilet) Pt Will Perform Toileting - Clothing Manipulation and hygiene: with supervision;sit to/from stand  OT Frequency: Min 2X/week   Barriers to D/C:            Co-evaluation PT/OT/SLP Co-Evaluation/Treatment: Yes Reason for Co-Treatment: For patient/therapist safety   OT goals addressed during session: ADL's and self-care;Proper use of Adaptive equipment and DME      AM-PAC OT "6 Clicks" Daily Activity     Outcome Measure Help from another person eating meals?: None Help from another person taking care of personal grooming?: A Little Help from another person toileting, which includes using toliet, bedpan, or urinal?: A Little Help from another person bathing (including washing, rinsing, drying)?: A Little Help from another person to put on and taking off regular upper body clothing?: None Help from another person to put on and taking off regular lower body clothing?: A Little 6 Click Score: 20   End of Session Equipment Utilized During Treatment: Gait belt;Rolling walker Nurse Communication: Mobility status(ordered geomat)  Activity Tolerance: Patient tolerated treatment well Patient left: in chair;with call bell/phone within reach;with family/visitor present  OT Visit Diagnosis: Unsteadiness on feet (R26.81);Muscle weakness (generalized) (M62.81);Pain  Time: [id number] OT Time Calculation (min): 34 min Charges:  OT General Charges $OT Visit: 1 Visit OT Evaluation $OT Eval Moderate Complexity: 1 Mod  Nestor Lewandowsky, OTR/L Acute Rehabilitation Services Pager: [phone] Office: [phone] Malka So 08/22/2019, 11:01 AM

## 2019-08-22 NOTE — Progress Notes (Signed)
Subjective/Chief Complaint: Just ate breakfast Reports he is trying to lay on alternating sides to offload sacral area   Objective: Vital signs in last 24 hours: Temp:  [98.7 F (37.1 C)-100.1 F (37.8 C)] 98.9 F (37.2 C) (01/27 0724) Pulse Rate:  [84-93] 84 (01/27 0724) Resp:  [17-20] 18 (01/27 0430) BP: (100-126)/(54-73) 100/54 (01/27 0724) SpO2:  [95 %-99 %] 98 % (01/27 0724) Weight:  [108.4 kg] 108.4 kg (01/27 0626) Last BM Date: 08/21/19  Intake/Output from previous day: 01/26 0701 - 01/27 0700 In: 1368.8 [P.O.:360; I.V.:697.2; IV Piggyback:311.6] Out: 1000 [Urine:1000] Intake/Output this shift: No intake/output data recorded.  sacral wound looks cleaner, improved cellulitis  Lab Results:  Recent Labs    08/21/19 1915 08/22/19 0500  WBC 11.3* 10.2  HGB 9.3* 8.8*  HCT 28.5* 27.3*  PLT 458* 427*   BMET Recent Labs    08/21/19 1626 08/21/19 1626 08/21/19 1915 08/22/19 0500  NA 136  --   --  135  K 3.9  --   --  3.3*  CL 103  --   --  101  CO2 22  --   --  24  GLUCOSE 106*  --   --  103*  BUN 7  --   --  <5*  CREATININE 0.80   < > 0.84 0.68  CALCIUM 8.4*  --   --  8.5*   < > = values in this interval not displayed.   PT/INR Recent Labs    08/20/19 2057  LABPROT 18.2*  INR 1.5*   ABG No results for input(s): PHART, HCO3 in the last 72 hours.  Invalid input(s): PCO2, PO2  Studies/Results: CT PELVIS W CONTRAST  Result Date: 08/20/2019 CLINICAL DATA:  Right buttock abscess EXAM: CT PELVIS WITH CONTRAST TECHNIQUE: Multidetector CT imaging of the pelvis was performed using the standard protocol following the bolus administration of intravenous contrast. CONTRAST:  12mL OMNIPAQUE IOHEXOL 300 MG/ML  SOLN COMPARISON:  07/19/2019 FINDINGS: Urinary Tract:  No abnormality visualized. Bowel:  Unremarkable visualized pelvic bowel loops. Vascular/Lymphatic: Atherosclerosis.  No aneurysm or adenopathy. Reproductive: Poorly visualized due to beam hardening  artifact from bilateral hip replacements. Other:  No free fluid or free air. Musculoskeletal: Image quality in the pelvis is degraded due to extensive beam hardening artifact from bilateral hip replacements. There is gas noted throughout the soft tissues surrounding the lower sacrum and coccyx. No visible drainable fluid collection, but the gas throughout the soft tissues is compatible with infection, possibly with gas-forming organism. In addition, there is a rim enhancing low-density area within the right psoas muscle measuring 3.2 x 2.6 cm on axial image 13. On coronal image 76, this extends off the superior aspect of the study. The visualized portion has a craniocaudal length of 5.4 cm. IMPRESSION: Gas within the soft tissue surrounding the lower sacrum and coccyx concerning for infection with gas-forming organism. No visible drainable fluid collection. Evaluation of this area is limited due to beam hardening artifact from bilateral hip replacements. Rim enhancing low-density area within the right psoas muscle which concerning for intramuscular abscess. Full extent extends above the 1st image for this CT pelvis. Electronically Signed   By: Rolm Baptise M.D.   On: 08/20/2019 21:45   DG CHEST PORT 1 VIEW  Result Date: 08/21/2019 CLINICAL DATA:  PICC placement EXAM: PORTABLE CHEST 1 VIEW COMPARISON:  07/24/2019 FINDINGS: Right arm PICC tip in the mid SVC. Lungs are clear without infiltrate effusion or edema. Cardiac enlargement. IMPRESSION: PICC  tip in the mid SVC. Electronically Signed   By: Franchot Gallo M.D.   On: 08/21/2019 16:44   CT IMAGE GUIDED FLUID DRAIN BY CATHETER  Result Date: 08/21/2019 INDICATION: 54 year old male with a persistent right psoas abscess despite prior CT-guided aspiration on 07/25/2019. He presents for CT-guided drain placement. EXAM: CT-guided drain placement MEDICATIONS: The patient is currently admitted to the hospital and receiving intravenous antibiotics. The antibiotics  were administered within an appropriate time frame prior to the initiation of the procedure. ANESTHESIA/SEDATION: Fentanyl 100 mcg IV; Versed 2 mg IV Moderate Sedation Time:  20 minutes The patient was continuously monitored during the procedure by the interventional radiology nurse under my direct supervision. COMPLICATIONS: None immediate. PROCEDURE: Informed written consent was obtained from the patient after a thorough discussion of the procedural risks, benefits and alternatives. All questions were addressed. Maximal Sterile Barrier Technique was utilized including caps, mask, sterile gowns, sterile gloves, sterile drape, hand hygiene and skin antiseptic. A timeout was performed prior to the initiation of the procedure. The patient was placed prone on the gantry table. A limited axial CT scan was performed. The low-attenuation collection in the right psoas muscle was successfully identified. A suitable skin entry site was selected and marked. The overlying skin was sterilely prepped and draped in the standard fashion using chlorhexidine skin prep. Local anesthesia was attained by infiltration with 1% lidocaine. A small dermatotomy was made. Under intermittent CT guidance, an 18 gauge trocar needle was carefully advanced along a right paraspinal approach into the fluid collection. A 0.035 wire was then coiled in the fluid collection. The tract was dilated to 10 [place] and a [place] all-purpose drainage catheter was advanced over the wire and formed within the fluid collection. Aspiration yields 25 mL turbid yellow fluid. Samples were sent for culture. The drainage catheter was secured to the skin with 0 Prolene suture and connected to JP bulb suction. Post drain placement CT imaging demonstrates a well-positioned drainage catheter with significant reduction in the volume of the intra psoas fluid collection. IMPRESSION: Successful placement of a 12 French drainage catheter into the right psoas fluid  collection with aspiration of 25 mL turbid yellow fluid. Samples were sent for Gram stain and culture. PLAN: 1. Maintain drain to JP bulb suction. 2. Flush drain with 5-10 mL sterile saline or water once per shift. 3. Once drain output has been minimal (no more than the flush volume) for 48 hours, the drainage catheter can be removed. Signed, Criselda Peaches, MD, [facility] Electronically Signed   By: Jacqulynn Cadet M.D.   On: 08/21/2019 11:59    Anti-infectives: Anti-infectives (From admission, onward)   Start     Dose/Rate Route Frequency Ordered Stop   08/21/19 0900  vancomycin (VANCOREADY) IVPB 1250 mg/250 mL     1,250 mg 166.7 mL/hr over 90 Minutes Intravenous Every 12 hours 08/20/19 2114     08/21/19 0400  piperacillin-tazobactam (ZOSYN) IVPB 3.375 g     3.375 g 12.5 mL/hr over 240 Minutes Intravenous Every 8 hours 08/20/19 2314     08/20/19 2100  vancomycin (VANCOCIN) 2,500 mg in sodium chloride 0.9 % 500 mL IVPB     2,500 mg 250 mL/hr over 120 Minutes Intravenous  Once 08/20/19 2059 08/20/19 2321   08/20/19 2045  piperacillin-tazobactam (ZOSYN) IVPB 3.375 g     3.375 g 100 mL/hr over 30 Minutes Intravenous  Once 08/20/19 2037 08/20/19 2130  Assessment/Plan: Lumbar epidural abscess cauda equina syndrome severe spinal stenosis L3-4 L4-5 s/p decompressive lumbar laminectomies L3-4 L4-5 with partial medial facetectomies and foraminotomies of the L3-L4 and L5 nerve roots 07/25/2019 Dr. Saintclair Halsted  MSSA bacteremia - ID following, was planning 6 weeks of IV nafcillin starting from 07/25/2019  Right psoas abscess - IR  Sepsis Sacral pressure ulcer with superinfection - Continue broad spectrum antibiotics. Dressing changes TID. Wound looks better than yesterday. Will follow.  ID - zosyn/vancomycin 1/25>> FEN - IVF, NPO VTE - SCDs, per primary/ok for chemical DVT prophylaxis from surgical standpoint Foley -  none Follow up - TBD  LOS: 2 days    [name] 08/22/2019

## 2019-08-22 NOTE — Progress Notes (Signed)
Nutrition Follow-up  DOCUMENTATION CODES:   Obesity unspecified  INTERVENTION:   -Continue Boost Breeze po TID, each supplement provides 250 kcal and 9 grams of protein -Continue Magic cup TID with meals, each supplement provides 290 kcal and 9 grams of protein -Continue MVI with minerals daily -Double protein portions with meals  NUTRITION DIAGNOSIS:   Increased nutrient needs related to wound healing as evidenced by estimated needs.  Ongoing  GOAL:   Patient will meet greater than or equal to 90% of their needs  Progressing   MONITOR:   PO intake, Supplement acceptance, Labs, Weight trends, Skin, I & O's  REASON FOR ASSESSMENT:   Malnutrition Screening Tool    ASSESSMENT:   [name] is a 54 y.o. male presenting with complaints of drainage from right buttock wound. Patient had a recent hospital admission 12/29-1/6 for lumbar epidural abscess, cauda equina syndrome, severe spinal stenosis L3-4 L4-5 status post decompressive lumbar laminectomy 3-4,4-5 for evacuation of epidural abscess on 12/30 by Dr. Saintclair [name] and CT aspiration of right psoas abscess per interventional radiology on 12/30.  Found to have MSSA bacteremia and started on nafcillin x6 weeks vi PICC through 2/9 per infectious disease recommendation.  Echocardiogram without vegetation.  He was in inpatient rehab since his recent hospitalization and was discharged on 1/15.  Patient states a few days after being discharged from rehab he started noticing pain and drainage from his buttock area.  The pain is now so severe that he is barely able to move in his bed.  He has not had fevers or chills at home.  No cough, shortness of breath, abdominal pain, or other complaints.  Denies saddle anesthesia or urinary/fecal incontinence.  Denies urinary retention.  Reviewed I/O's: +369 ml x 24 hours and +320 ml since admission  UOP: 1 L x 24 hours  Spoke with pt and daughter at bedside. Pt daughter shares that pt has  experienced a general decline in health over the past 2 months, since previous hospitalization. Per daughter, pt was hospitalized at [facility] for several weeks (incuding CIR), returned home for a week, and then came back. His intake was poor during last hospitalization and per daughter, pt would consumes 3 meals per day while at home, however, meals were very small consisting of a boiled egg or a [place] or pimento cheese sandwich.   Pt reports that he tries to eat what he can despite having no appetite. He is focusing on protein intake secondary to wound healing. Pt estimates he consumed about 50% of his breakfast- eggs and bacon and a small amount of grits. Observed lunch tray- pt consumed about 25% of of spaghetti and 50% of breadstick.   Per daughter, pt UBW is around 270#, which he last weighed about 2 months ago. She estimates he has lost about 50 pounds over this time period. Pt shares that he has been weaker and also has lost lean body mass. Pt in good spirits and joked with this RD during visit- stating "I have less muscle than my son now" and chanting "Hercules! Hercules!" during exam.   Pt confirmed that he does not like Ensure Enlive supplements. He likes Boost Breeze supplements and has consumed 2 since hospitalization. Offered other supplements on formulary- pt does not prefer thick, milky supplements. He recalls taking Prostat supplement, however, states "don't give that to me ever again". He prefers orange [employer], dispensing. Discussed importance of good meal and supplement intake to promote healing. Pt reports he is motivated to  do what is needed so he can return home.   Medications reviewed and include MVI, folic acid, and thiamine.  Labs reviewed: K: 3.3.   NUTRITION - FOCUSED PHYSICAL EXAM:    Most Recent Value  Orbital Region  No depletion  Upper Arm Region  No depletion  Thoracic and Lumbar Region  No depletion  Buccal Region  No depletion  Temple Region  Mild depletion  Clavicle Bone  Region  No depletion  Clavicle and Acromion Bone Region  No depletion  Scapular Bone Region  No depletion  Dorsal Hand  Mild depletion  Patellar Region  Mild depletion  Anterior Thigh Region  Mild depletion  Posterior Calf Region  Mild depletion  Edema (RD Assessment)  Mild  Hair  Reviewed  Eyes  Reviewed  Mouth  Reviewed  Skin  Reviewed  Nails  Reviewed       Diet Order:   Diet Order            Diet regular Room service appropriate? Yes; Fluid consistency: Thin  Diet effective now              EDUCATION NEEDS:   No education needs have been identified at this time  Skin:  Skin Assessment: Skin Integrity Issues: Skin Integrity Issues:: Incisions, Stage II Stage II: rt upper buttocks, rt lower buttocks Incisions: back Other: rt buttocks laceration  Last BM:  08/22/19  Height:   Ht Readings from Last 1 Encounters:  08/21/19 6\' 1"  (1.854 m)    Weight:   Wt Readings from Last 1 Encounters:  08/22/19 108.4 kg    Ideal Body Weight:  83.6 kg  BMI:  Body mass index is 31.53 kg/m.  Estimated Nutritional Needs:   Kcal:  2500-2700  Protein:  145-165 grams  Fluid:  > 2.5 L    [name] A. [name], RD, LDN, [place] Registered Dietitian II Certified Diabetes Care and Education Specialist Pager: [phone] After hours Pager: [phone]

## 2019-08-23 DIAGNOSIS — Z91030 Bee allergy status: Secondary | ICD-10-CM

## 2019-08-23 DIAGNOSIS — G061 Intraspinal abscess and granuloma: Secondary | ICD-10-CM

## 2019-08-23 DIAGNOSIS — Z885 Allergy status to narcotic agent status: Secondary | ICD-10-CM

## 2019-08-23 DIAGNOSIS — Z9889 Other specified postprocedural states: Secondary | ICD-10-CM

## 2019-08-23 DIAGNOSIS — G834 Cauda equina syndrome: Secondary | ICD-10-CM

## 2019-08-23 DIAGNOSIS — B9561 Methicillin susceptible Staphylococcus aureus infection as the cause of diseases classified elsewhere: Secondary | ICD-10-CM

## 2019-08-23 DIAGNOSIS — L89159 Pressure ulcer of sacral region, unspecified stage: Secondary | ICD-10-CM

## 2019-08-23 DIAGNOSIS — Z87891 Personal history of nicotine dependence: Secondary | ICD-10-CM

## 2019-08-23 DIAGNOSIS — M4626 Osteomyelitis of vertebra, lumbar region: Secondary | ICD-10-CM

## 2019-08-23 DIAGNOSIS — K6812 Psoas muscle abscess: Secondary | ICD-10-CM

## 2019-08-23 DIAGNOSIS — Z95828 Presence of other vascular implants and grafts: Secondary | ICD-10-CM

## 2019-08-23 LAB — RENAL FUNCTION PANEL
Albumin: 1.7 g/dL — ABNORMAL LOW (ref 3.5–5.0)
Anion gap: 13 (ref 5–15)
BUN: <5 mg/dL — ABNORMAL LOW (ref 6–20)
CO2: 23 mmol/L (ref 22–32)
Calcium: 8.8 mg/dL — ABNORMAL LOW (ref 8.9–10.3)
Chloride: 100 mmol/L (ref 98–111)
Creatinine, Ser: 0.67 mg/dL (ref 0.61–1.24)
GFR calc Af Amer: >60 mL/min (ref >60)
GFR calc non Af Amer: >60 mL/min (ref >60)
Glucose, Bld: 104 mg/dL — ABNORMAL HIGH (ref 70–99)
Phosphorus: 4.4 mg/dL (ref 2.5–4.6)
Potassium: 3.9 mmol/L (ref 3.5–5.1)
Sodium: 136 mmol/L (ref 135–145)

## 2019-08-23 LAB — FERRITIN: Ferritin: 530 ng/mL — ABNORMAL HIGH (ref 24–336)

## 2019-08-23 LAB — CBC
HCT: 27.8 % — ABNORMAL LOW (ref 39.0–52.0)
Hemoglobin: 8.9 g/dL — ABNORMAL LOW (ref 13.0–17.0)
MCH: 30.4 pg (ref 26.0–34.0)
MCHC: 32 g/dL (ref 30.0–36.0)
MCV: 94.9 fL (ref 80.0–100.0)
Platelets: 458 10*3/uL — ABNORMAL HIGH (ref 150–400)
RBC: 2.93 MIL/uL — ABNORMAL LOW (ref 4.22–5.81)
RDW: 14 % (ref 11.5–15.5)
WBC: 10 10*3/uL (ref 4.0–10.5)
nRBC: 0 % (ref 0.0–0.2)

## 2019-08-23 LAB — IRON AND TIBC
Iron: 23 ug/dL — ABNORMAL LOW (ref 45–182)
Saturation Ratios: 18 % (ref 17.9–39.5)
TIBC: 130 ug/dL — ABNORMAL LOW (ref 250–450)
UIBC: 107 ug/dL

## 2019-08-23 LAB — URINE CULTURE: Culture: NO GROWTH

## 2019-08-23 LAB — RETICULOCYTES
Immature Retic Fract: 26.2 % — ABNORMAL HIGH (ref 2.3–15.9)
RBC.: 2.94 MIL/uL — ABNORMAL LOW (ref 4.22–5.81)
Retic Count, Absolute: 34.1 10*3/uL (ref 19.0–186.0)
Retic Ct Pct: 1.2 % (ref 0.4–3.1)

## 2019-08-23 LAB — FOLATE: Folate: 11.2 ng/mL (ref >5.9)

## 2019-08-23 LAB — MAGNESIUM: Magnesium: 1.9 mg/dL (ref 1.7–2.4)

## 2019-08-23 LAB — VITAMIN B12: Vitamin B-12: 196 pg/mL (ref 180–914)

## 2019-08-23 MED ORDER — SIMETHICONE 80 MG PO CHEW
80.0000 mg | CHEWABLE_TABLET | Freq: Four times a day (QID) | ORAL | Status: DC | PRN
  Administered 2019-08-23: 12:00:00 80 mg via ORAL
  Filled 2019-08-23 (×2): qty 1

## 2019-08-23 MED ORDER — SODIUM CHLORIDE 0.9 % IV SOLN
3.0000 g | Freq: Four times a day (QID) | INTRAVENOUS | Status: DC
  Administered 2019-08-23 – 2019-08-28 (×21): 3 g via INTRAVENOUS
  Filled 2019-08-23 (×3): qty 8
  Filled 2019-08-23 (×5): qty 3
  Filled 2019-08-23: qty 8
  Filled 2019-08-23: qty 3
  Filled 2019-08-23 (×5): qty 8
  Filled 2019-08-23 (×3): qty 3
  Filled 2019-08-23: qty 8
  Filled 2019-08-23: qty 3
  Filled 2019-08-23: qty 8
  Filled 2019-08-23: qty 3
  Filled 2019-08-23 (×2): qty 8

## 2019-08-23 MED ORDER — MELATONIN 3 MG PO TABS
6.0000 mg | ORAL_TABLET | Freq: Every evening | ORAL | Status: DC | PRN
  Administered 2019-08-24 – 2019-08-26 (×3): 6 mg via ORAL
  Filled 2019-08-23 (×7): qty 2

## 2019-08-23 NOTE — Plan of Care (Signed)
°  Problem: Education: °Goal: Knowledge of General Education information will improve °Description: Including pain rating scale, medication(s)/side effects and non-pharmacologic comfort measures °Outcome: Progressing °  °Problem: Clinical Measurements: °Goal: Ability to maintain clinical measurements within normal limits will improve °Outcome: Progressing °  °Problem: Nutrition: °Goal: Adequate nutrition will be maintained °Outcome: Progressing °  °

## 2019-08-23 NOTE — Progress Notes (Signed)
PROGRESS NOTE  [name] [id number] DOB: 10/17/1965   PCP: Lesleigh Noe, MD  Patient is from: home  DOA: 08/20/2019 LOS: 3  Brief Narrative / Interim history: This is a 54 year old male with recent hospital admission from 12/29-1/6 for lumbar epidural abscess, cauda equina syndrome, severe spinal stenosis status post decompressive lumbar laminectomy L3-4, 4-5 for evacuation of epidural abscess on 12/30 and CT aspiration of right psoas abscess on 12/30 found to have MSSA bacteremia started on nafcillin for 6 weeks via PICC line planned through 2/9 per ID who was in CIR and discharged 1/15 and noticed pain and drainage of buttock area, febrile in the ED and tachycardic leukocytosis.  Started on vancomycin/Zosyn, IV fluids and found to have right psoas muscle intramuscular abscess.  General surgery and IR have been consulted.  General surgery was able to debride large amount of material at bedside on 1/25. IR guided right psoas muscle abscess drainage on 1/26.  Infectious disease consulted on 1/28 for assistance with his antibiotics.  Subjective: No major events overnight or this morning.  Reports having a good night.  Pain fairly controlled.  Denies chest pain, dyspnea, bowel or bladder issue.  No new focal neuro deficit.  Objective: Vitals:   08/22/19 1202 08/22/19 1953 08/23/19 0000 08/23/19 0502  BP: 112/67 114/66  112/70  Pulse: 91 91 84 85  Resp: 18 18  18   Temp: 98 F (36.7 C) 98.9 F (37.2 C)  99.4 F (37.4 C)  TempSrc: Oral Oral  Oral  SpO2: 97% 100%  100%  Weight:    103.5 kg  Height:        Intake/Output Summary (Last 24 hours) at 08/23/2019 1212 Last data filed at 08/23/2019 0900 Gross per 24 hour  Intake 1590.2 ml  Output 2090 ml  Net -499.8 ml   Filed Weights   08/21/19 0115 08/22/19 0626 08/23/19 0502  Weight: 104.4 kg 108.4 kg 103.5 kg    Examination:  GENERAL: No acute distress.  Appears well.  HEENT: MMM.  Vision and hearing grossly intact.    NECK: Supple.  No apparent JVD.  RESP:  No IWOB. Good air movement bilaterally. CVS:  RRR. Heart sounds normal.  ABD/GI/GU: Bowel sounds present. Soft. Non tender.  MSK/EXT:  No apparent deformity. No edema. B/l LE weakness SKIN: Infected sacral decubitus ulcer NEURO: Awake, alert and oriented appropriately.  Motor 3/5 in both LEs with hip flexion. PSYCH: Calm. Normal affect.  Procedures:  1/25-general surgery was able to debride large amount of material at bedside.  1/26-IR guided right psoas muscle abscess drainage on 1/26.  Assessment & Plan: Sepsis secondary to cellulitis, infected sacral decubitus and right psoas intramuscular abscess.  Surgical debridement and IR drainage with drain placement as above.  Sepsis physiology resolved.  MRSA PCR, blood and tissue cultures negative so far.  Was on nafcillin for MSSA bacteremia since 07/25/2019. -IR-plan for repeat CT if no significant output from drain. -General surgery-continue antibiotics, dressing changes... -ID-Vancomycin and Zosyn 1/25-1/28>>IV Unasyn 1/28>> -Wound care following. -Add Florastor -Pain control with bowel regimen.  MSSA bacteremia: Was on nafcillin since 07/25/2019, for a total of 6 weeks.   -Antibiotics as above per ID.  Acute toxic metabolic encephalopathy likely combination of medications and sepsis as above. -Minimize sedating medications -Treat treatable causes as above. -Reorientation and delirium precautions.  Lumbar epidural abscess/cauda equina syndrome with severe spinal stenosis at L3-L4 and L4-L5: Had decompressive lumbar laminectomies L3-4, L4-5 with partial medial facetectomies and foraminotomies of  L3-4 and 5 on 07/25/2019 by Dr. Saintclair Halsted.  Has bilateral lower extremity weakness.  Denies bowel or bladder issue now. -PT/OT  Normocytic anemia: Baseline Hgb 10-11> 10.7 (admit)>> 8.9.  Likely some element of hemodilution, blood draws and surgical bleed -Continue monitoring -Follow anemia  panel  Hypokalemia: Resolved.  Alcohol use disorder -Encourage cessation -CIWA with Ativan -Vitamins  Hyponatremia: Resolved. -Continue monitoring  Class II obesity Body mass index is 30.1 kg/m. -Encourage lifestyle change to lose weight.    Pressure injury Pressure Injury 07/31/19 Buttocks Medial Stage 2 -  Partial thickness loss of dermis presenting as a shallow open injury with a red, pink wound bed without slough. (Active)  07/31/19 1640  Location: Buttocks  Location Orientation: Medial  Staging: Stage 2 -  Partial thickness loss of dermis presenting as a shallow open injury with a red, pink wound bed without slough.  Wound Description (Comments):   Present on Admission: Yes     Pressure Injury 07/31/19 Buttocks Right;Lower Stage 2 -  Partial thickness loss of dermis presenting as a shallow open injury with a red, pink wound bed without slough. (Active)  07/31/19 1644  Location: Buttocks  Location Orientation: Right;Lower  Staging: Stage 2 -  Partial thickness loss of dermis presenting as a shallow open injury with a red, pink wound bed without slough.  Wound Description (Comments):   Present on Admission: Yes     Pressure Injury 07/31/19 Buttocks Right;Upper Stage 2 -  Partial thickness loss of dermis presenting as a shallow open injury with a red, pink wound bed without slough. (Active)  07/31/19 1646  Location: Buttocks  Location Orientation: Right;Upper  Staging: Stage 2 -  Partial thickness loss of dermis presenting as a shallow open injury with a red, pink wound bed without slough.  Wound Description (Comments):   Present on Admission: Yes     Pressure Injury 08/21/19 Medial (Active)  08/21/19 0200  Location:   Location Orientation: Medial  Staging:   Wound Description (Comments):   Present on Admission: Yes     Pressure Injury 08/21/19 Sacrum Unstageable - Full thickness tissue loss in which the base of the injury is covered by slough (yellow, tan,  gray, green or brown) and/or eschar (tan, brown or black) in the wound bed. (Active)  08/21/19 2015  Location: Sacrum  Location Orientation:   Staging: Unstageable - Full thickness tissue loss in which the base of the injury is covered by slough (yellow, tan, gray, green or brown) and/or eschar (tan, brown or black) in the wound bed.  Wound Description (Comments):   Present on Admission: Yes     Pressure Injury 08/21/19 Buttocks Right Unstageable - Full thickness tissue loss in which the base of the injury is covered by slough (yellow, tan, gray, green or brown) and/or eschar (tan, brown or black) in the wound bed. (Active)  08/21/19 2015  Location: Buttocks  Location Orientation: Right  Staging: Unstageable - Full thickness tissue loss in which the base of the injury is covered by slough (yellow, tan, gray, green or brown) and/or eschar (tan, brown or black) in the wound bed.  Wound Description (Comments):   Present on Admission: Yes     Nutrition Problem: Increased nutrient needs Etiology: wound healing  Signs/Symptoms: estimated needs  Interventions: MVI, Boost Breeze, Magic cup, Refer to RD note for recommendations   DVT prophylaxis: Subcu Lovenox Code Status: Full code Family Communication: Patient and/or RN. Available if any question. Disposition Plan: Remains inpatient for treatment of  sepsis due to cellulitis/abscess Consultants: General surgery, IR, ID   Microbiology summarized: 1/25-COVID-19 influenza PCR negative. 1/26-MRSA PCR negative. 1/25-blood cultures negative. 1/26-abscess cultures negative so far.  Sch Meds:  Scheduled Meds: . Chlorhexidine Gluconate Cloth  6 each Topical Daily  . enoxaparin (LOVENOX) injection  40 mg Subcutaneous Q24H  . feeding supplement  1 Container Oral TID BM  . folic acid  1 mg Oral Daily  . multivitamin with minerals  1 tablet Oral Daily  . sodium chloride flush  10-40 mL Intracatheter Q12H  . sodium chloride flush  5 mL  Intracatheter Q8H  . tamsulosin  0.4 mg Oral QPC supper  . thiamine  100 mg Oral Daily   Or  . thiamine  100 mg Intravenous Daily   Continuous Infusions: . ampicillin-sulbactam (UNASYN) IV 3 g (08/23/19 1153)   PRN Meds:.acetaminophen **OR** acetaminophen, cyclobenzaprine, LORazepam **OR** LORazepam, morphine injection, simethicone, sodium chloride flush  Antimicrobials: Anti-infectives (From admission, onward)   Start     Dose/Rate Route Frequency Ordered Stop   08/23/19 1230  Ampicillin-Sulbactam (UNASYN) 3 g in sodium chloride 0.9 % 100 mL IVPB     3 g 200 mL/hr over 30 Minutes Intravenous Every 6 hours 08/23/19 1018     08/21/19 0900  vancomycin (VANCOREADY) IVPB 1250 mg/250 mL  Status:  Discontinued     1,250 mg 166.7 mL/hr over 90 Minutes Intravenous Every 12 hours 08/20/19 2114 08/23/19 1018   08/21/19 0400  piperacillin-tazobactam (ZOSYN) IVPB 3.375 g  Status:  Discontinued     3.375 g 12.5 mL/hr over 240 Minutes Intravenous Every 8 hours 08/20/19 2314 08/23/19 1018   08/20/19 2100  vancomycin (VANCOCIN) 2,500 mg in sodium chloride 0.9 % 500 mL IVPB     2,500 mg 250 mL/hr over 120 Minutes Intravenous  Once 08/20/19 2059 08/20/19 2321   08/20/19 2045  piperacillin-tazobactam (ZOSYN) IVPB 3.375 g     3.375 g 100 mL/hr over 30 Minutes Intravenous  Once 08/20/19 2037 08/20/19 2130       I have personally reviewed the following labs and images: CBC: Recent Labs  Lab 08/20/19 2016 08/21/19 0714 08/21/19 1915 08/22/19 0500 08/23/19 0446  WBC 14.8* 11.6* 11.3* 10.2 10.0  NEUTROABS 12.8*  --   --   --   --   HGB 10.7* 9.2* 9.3* 8.8* 8.9*  HCT 32.4* 28.6* 28.5* 27.3* 27.8*  MCV 93.4 93.8 95.3 95.1 94.9  PLT 512* 435* 458* 427* 458*   BMP &GFR Recent Labs  Lab 08/20/19 2016 08/20/19 2016 08/21/19 0714 08/21/19 1626 08/21/19 1915 08/22/19 0500 08/23/19 0446  NA 131*  --  135 136  --  135 136  K 3.6  --  3.6 3.9  --  3.3* 3.9  CL 96*  --  101 103  --  101 100   CO2 22  --  24 22  --  24 23  GLUCOSE 139*  --  110* 106*  --  103* 104*  BUN 9  --  7 7  --  <5* <5*  CREATININE 0.93   < > 0.76 0.80 0.84 0.68 0.67  CALCIUM 8.8*  --  8.5* 8.4*  --  8.5* 8.8*  MG  --   --  1.8  --   --  1.9 1.9  PHOS  --   --  4.3  --   --   --  4.4   < > = values in this interval not displayed.   Estimated Creatinine  Clearance: 133.3 mL/min (by C-G formula based on SCr of 0.67 mg/dL). Liver & Pancreas: Recent Labs  Lab 08/20/19 2016 08/21/19 1626 08/23/19 0446  AST 19 13*  --   ALT 17 13  --   ALKPHOS 61 51  --   BILITOT 1.2 1.0  --   PROT 7.6 6.4*  --   ALBUMIN 2.1* 1.8* 1.7*   No results for input(s): LIPASE, AMYLASE in the last 168 hours. No results for input(s): AMMONIA in the last 168 hours. Diabetic: No results for input(s): HGBA1C in the last 72 hours. No results for input(s): GLUCAP in the last 168 hours. Cardiac Enzymes: No results for input(s): CKTOTAL, CKMB, CKMBINDEX, TROPONINI in the last 168 hours. No results for input(s): PROBNP in the last 8760 hours. Coagulation Profile: Recent Labs  Lab 08/20/19 2057  INR 1.5*   Thyroid Function Tests: No results for input(s): TSH, T4TOTAL, FREET4, T3FREE, THYROIDAB in the last 72 hours. Lipid Profile: No results for input(s): CHOL, HDL, LDLCALC, TRIG, CHOLHDL, LDLDIRECT in the last 72 hours. Anemia Panel: Recent Labs    08/23/19 0711  RETICCTPCT 1.2   Urine analysis:    Component Value Date/Time   COLORURINE YELLOW 08/22/2019 1216   APPEARANCEUR CLEAR 08/22/2019 1216   LABSPEC 1.014 08/22/2019 1216   PHURINE 5.0 08/22/2019 1216   GLUCOSEU NEGATIVE 08/22/2019 1216   HGBUR SMALL (A) 08/22/2019 1216   BILIRUBINUR NEGATIVE 08/22/2019 1216   KETONESUR NEGATIVE 08/22/2019 1216   PROTEINUR NEGATIVE 08/22/2019 1216   UROBILINOGEN 0.2 01/09/2013 1046   NITRITE NEGATIVE 08/22/2019 1216   LEUKOCYTESUR NEGATIVE 08/22/2019 1216   Sepsis Labs: Invalid input(s): PROCALCITONIN,  [place]  Microbiology: Recent Results (from the past 240 hour(s))  Blood culture (routine x 2)     Status: None (Preliminary result)   Collection Time: 08/20/19  8:15 PM   Specimen: BLOOD LEFT ARM  Result Value Ref Range Status   Specimen Description BLOOD LEFT ARM  Final   Special Requests   Final    BOTTLES DRAWN AEROBIC AND ANAEROBIC Blood Culture adequate volume   Culture   Final    NO GROWTH 3 DAYS Performed at [facility], [address] [place], [place] [address]    Report Status PENDING  Incomplete  Blood culture (routine x 2)     Status: None (Preliminary result)   Collection Time: 08/20/19  8:30 PM   Specimen: BLOOD LEFT ARM  Result Value Ref Range Status   Specimen Description BLOOD LEFT ARM  Final   Special Requests   Final    BOTTLES DRAWN AEROBIC ONLY Blood Culture results may not be optimal due to an excessive volume of blood received in culture bottles   Culture   Final    NO GROWTH 3 DAYS Performed at [facility], [place] [address], [place], [place] [address]    Report Status PENDING  Incomplete  Respiratory Panel by RT PCR (Flu A&B, Covid) - Nasopharyngeal Swab     Status: None   Collection Time: 08/20/19 10:51 PM   Specimen: Nasopharyngeal Swab  Result Value Ref Range Status   SARS Coronavirus 2 by RT PCR NEGATIVE NEGATIVE Final    Comment: (NOTE) SARS-CoV-2 target nucleic acids are NOT DETECTED. The SARS-CoV-2 RNA is generally detectable in upper respiratoy specimens during the acute phase of infection. The lowest concentration of SARS-CoV-2 viral copies this assay can detect is 131 copies/mL. A negative result does not preclude SARS-Cov-2 infection and should not be  used as the sole basis for treatment or other patient management decisions. A negative result may occur with  improper specimen collection/handling, submission of specimen other than nasopharyngeal swab, presence of viral mutation(s) within the areas targeted by this assay,  and inadequate number of viral copies (<131 copies/mL). A negative result must be combined with clinical observations, patient history, and epidemiological information. The expected result is Negative. Fact Sheet for Patients:  PinkCheek.be Fact Sheet for Healthcare Providers:  GravelBags.it This test is not yet ap proved or cleared by the [place] FDA and  has been authorized for detection and/or diagnosis of SARS-CoV-2 by FDA under an Emergency Use Authorization (EUA). This EUA will remain  in effect (meaning this test can be used) for the duration of the COVID-19 declaration under Section 564(b)(1) of the Act, 21 U.S.C. section 360bbb-3(b)(1), unless the authorization is terminated or revoked sooner.    Influenza A by PCR NEGATIVE NEGATIVE Final   Influenza B by PCR NEGATIVE NEGATIVE Final    Comment: (NOTE) The Xpert Xpress SARS-CoV-2/FLU/RSV assay is intended as an aid in  the diagnosis of influenza from Nasopharyngeal swab specimens and  should not be used as a sole basis for treatment. Nasal washings and  aspirates are unacceptable for Xpert Xpress SARS-CoV-2/FLU/RSV  testing. Fact Sheet for Patients: PinkCheek.be Fact Sheet for Healthcare Providers: GravelBags.it This test is not yet approved or cleared by the [place] FDA and  has been authorized for detection and/or diagnosis of SARS-CoV-2 by  FDA under an Emergency Use Authorization (EUA). This EUA will remain  in effect (meaning this test can be used) for the duration of the  Covid-19 declaration under Section 564(b)(1) of the Act, 21  U.S.C. section 360bbb-3(b)(1), unless the authorization is  terminated or revoked. Performed at [facility], [place] [address], [place], [place] [address]   MRSA PCR Screening     Status: None   Collection Time: 08/21/19  5:20 AM   Specimen: Nasopharyngeal   Result Value Ref Range Status   MRSA by PCR NEGATIVE NEGATIVE Final    Comment:        The GeneXpert MRSA Assay (FDA approved for NASAL specimens only), is one component of a comprehensive MRSA colonization surveillance program. It is not intended to diagnose MRSA infection nor to guide or monitor treatment for MRSA infections. Performed at [facility], [place] [address], [name], [place] [address]   Aerobic/Anaerobic Culture (surgical/deep wound)     Status: None (Preliminary result)   Collection Time: 08/21/19 11:22 AM   Specimen: Abscess  Result Value Ref Range Status   Specimen Description ABSCESS  Final   Special Requests Normal  Final   Gram Stain   Final    MODERATE WBC PRESENT, PREDOMINANTLY MONONUCLEAR NO ORGANISMS SEEN    Culture   Final    NO GROWTH 2 DAYS Performed at [facility], [place] [address] [place], [place] [address]    Report Status PENDING  Incomplete  Urine culture     Status: None   Collection Time: 08/22/19 12:53 PM   Specimen: In/Out Cath Urine  Result Value Ref Range Status   Specimen Description IN/OUT CATH URINE  Final   Special Requests NONE  Final   Culture   Final    NO GROWTH Performed at [facility], [place] [address] [place], [place] [address]    Report Status 08/23/2019 FINAL  Final    Radiology Studies: No results found.  Lesta Limbert T. Secor  If 7PM-7AM, please contact night-coverage [email or web address] Password [email or web address] 08/23/2019, 12:12 PM

## 2019-08-23 NOTE — Progress Notes (Signed)
Referring Physician(s): Rathore,V  Supervising Physician: Markus Daft  Patient Status:  [facility] - In-pt  Chief Complaint: Back pain/psoas abscess   Subjective: Pt cont to have back pain; currently eating some breakfast; denies N/V   Allergies: Bee venom and Hydrocodone  Medications: Prior to Admission medications   Medication Sig Start Date End Date Taking? Authorizing Provider  acetaminophen (TYLENOL) 325 MG tablet Take 2 tablets (650 mg total) by mouth every 4 (four) hours as needed for mild pain ((score 1 to 3) or temp > 100.5). 08/09/19  Yes Angiulli, Lavon Paganini, PA-C  cyclobenzaprine (FLEXERIL) 10 MG tablet Take 1 tablet (10 mg total) by mouth 3 (three) times daily as needed for muscle spasms. 08/09/19  Yes Angiulli, Lavon Paganini, PA-C  nafcillin IVPB Inject 12 g into the vein daily. As a continuous infusion Indication:  Disseminated MSSA infection Last Day of Therapy:  09/04/19 Labs - Once weekly:  CBC/D and BMP, Labs - Every other week:  ESR and CRP 08/07/19 09/11/19 Yes Angiulli, Lavon Paganini, PA-C  Oxycodone HCl 10 MG TABS Take 1 tablet (10 mg total) by mouth every 4 (four) hours as needed ((score 7 to 10)). Patient taking differently: Take 10 mg by mouth every 4 (four) hours as needed (For pain).  08/20/19  Yes Lovorn, Jinny Blossom, MD  bisacodyl (DULCOLAX) 10 MG suppository Place 1 suppository (10 mg total) rectally daily as needed for moderate constipation. Patient not taking: Reported on 08/20/2019 08/09/19   Angiulli, Lavon Paganini, PA-C  Multiple Vitamin (MULTIVITAMIN WITH MINERALS) TABS tablet Take 1 tablet by mouth daily. Patient not taking: Reported on 08/20/2019 08/10/19   Angiulli, Lavon Paganini, PA-C  pantoprazole (PROTONIX) 40 MG tablet Take 1 tablet (40 mg total) by mouth at bedtime. Patient not taking: Reported on 08/20/2019 08/09/19   Angiulli, Lavon Paganini, PA-C  polyethylene glycol (MIRALAX / GLYCOLAX) 17 g packet Take 17 g by mouth daily as needed for mild constipation. Patient not taking:  Reported on 08/20/2019 08/09/19   Angiulli, Lavon Paganini, PA-C  predniSONE (DELTASONE) 5 MG tablet Take 1 tablet (5 mg total) by mouth daily with breakfast. Patient not taking: Reported on 08/20/2019 08/10/19   Angiulli, Lavon Paganini, PA-C  senna (SENOKOT) 8.6 MG TABS tablet Take 2 tablets (17.2 mg total) by mouth daily. Patient not taking: Reported on 08/20/2019 08/10/19   Angiulli, Lavon Paganini, PA-C  sodium chloride 0.9 % SOLN 452 mL with nafcillin 2 g SOLR 12 g Inject 12 g into the vein daily. Patient not taking: Reported on 08/20/2019 08/09/19   Angiulli, Lavon Paganini, PA-C  tamsulosin (FLOMAX) 0.4 MG CAPS capsule Take 1 capsule (0.4 mg total) by mouth daily after supper. Patient not taking: Reported on 08/20/2019 08/09/19   Angiulli, Lavon Paganini, PA-C  traZODone (DESYREL) 50 MG tablet Take 1 tablet (50 mg total) by mouth at bedtime as needed for sleep. Patient not taking: Reported on 08/20/2019 08/09/19   Cathlyn Parsons, PA-C     Vital Signs: BP 112/70 (BP Location: Left Arm)   Pulse 85   Temp 99.4 F (37.4 C) (Oral)   Resp 18   Ht '6\' 1"'  (1.854 m)   Wt 228 lb 2.8 oz (103.5 kg)   SpO2 100%   BMI 30.10 kg/m   Physical Exam  Awake/alert; rt flank drain intact, dressing dry, site mild- mod tender, OP 15 cc serosang fluid; attempted to flush drain but no sig OP returned  Imaging: CT PELVIS W CONTRAST  Result Date: 08/20/2019 CLINICAL  DATA:  Right buttock abscess EXAM: CT PELVIS WITH CONTRAST TECHNIQUE: Multidetector CT imaging of the pelvis was performed using the standard protocol following the bolus administration of intravenous contrast. CONTRAST:  148m OMNIPAQUE IOHEXOL 300 MG/ML  SOLN COMPARISON:  07/19/2019 FINDINGS: Urinary Tract:  No abnormality visualized. Bowel:  Unremarkable visualized pelvic bowel loops. Vascular/Lymphatic: Atherosclerosis.  No aneurysm or adenopathy. Reproductive: Poorly visualized due to beam hardening artifact from bilateral hip replacements. Other:  No free fluid or free air.  Musculoskeletal: Image quality in the pelvis is degraded due to extensive beam hardening artifact from bilateral hip replacements. There is gas noted throughout the soft tissues surrounding the lower sacrum and coccyx. No visible drainable fluid collection, but the gas throughout the soft tissues is compatible with infection, possibly with gas-forming organism. In addition, there is a rim enhancing low-density area within the right psoas muscle measuring 3.2 x 2.6 cm on axial image 13. On coronal image 76, this extends off the superior aspect of the study. The visualized portion has a craniocaudal length of 5.4 cm. IMPRESSION: Gas within the soft tissue surrounding the lower sacrum and coccyx concerning for infection with gas-forming organism. No visible drainable fluid collection. Evaluation of this area is limited due to beam hardening artifact from bilateral hip replacements. Rim enhancing low-density area within the right psoas muscle which concerning for intramuscular abscess. Full extent extends above the 1st image for this CT pelvis. Electronically Signed   By: KRolm BaptiseM.D.   On: 08/20/2019 21:45   DG CHEST PORT 1 VIEW  Result Date: 08/21/2019 CLINICAL DATA:  PICC placement EXAM: PORTABLE CHEST 1 VIEW COMPARISON:  07/24/2019 FINDINGS: Right arm PICC tip in the mid SVC. Lungs are clear without infiltrate effusion or edema. Cardiac enlargement. IMPRESSION: PICC tip in the mid SVC. Electronically Signed   By: CFranchot GalloM.D.   On: 08/21/2019 16:44   CT IMAGE GUIDED FLUID DRAIN BY CATHETER  Result Date: 08/21/2019 INDICATION: 54year old male with a persistent right psoas abscess despite prior CT-guided aspiration on 07/25/2019. He presents for CT-guided drain placement. EXAM: CT-guided drain placement MEDICATIONS: The patient is currently admitted to the hospital and receiving intravenous antibiotics. The antibiotics were administered within an appropriate time frame prior to the initiation of  the procedure. ANESTHESIA/SEDATION: Fentanyl 100 mcg IV; Versed 2 mg IV Moderate Sedation Time:  20 minutes The patient was continuously monitored during the procedure by the interventional radiology nurse under my direct supervision. COMPLICATIONS: None immediate. PROCEDURE: Informed written consent was obtained from the patient after a thorough discussion of the procedural risks, benefits and alternatives. All questions were addressed. Maximal Sterile Barrier Technique was utilized including caps, mask, sterile gowns, sterile gloves, sterile drape, hand hygiene and skin antiseptic. A timeout was performed prior to the initiation of the procedure. The patient was placed prone on the gantry table. A limited axial CT scan was performed. The low-attenuation collection in the right psoas muscle was successfully identified. A suitable skin entry site was selected and marked. The overlying skin was sterilely prepped and draped in the standard fashion using chlorhexidine skin prep. Local anesthesia was attained by infiltration with 1% lidocaine. A small dermatotomy was made. Under intermittent CT guidance, an 18 gauge trocar needle was carefully advanced along a right paraspinal approach into the fluid collection. A 0.035 wire was then coiled in the fluid collection. The tract was dilated to 10 [place] a [place]-purpose drainage catheter was advanced over the wire and formed  within the fluid collection. Aspiration yields 25 mL turbid yellow fluid. Samples were sent for culture. The drainage catheter was secured to the skin with 0 Prolene suture and connected to JP bulb suction. Post drain placement CT imaging demonstrates a well-positioned drainage catheter with significant reduction in the volume of the intra psoas fluid collection. IMPRESSION: Successful placement of a 12 French drainage catheter into the right psoas fluid collection with aspiration of 25 mL turbid yellow fluid. Samples were sent for Gram  stain and culture. PLAN: 1. Maintain drain to JP bulb suction. 2. Flush drain with 5-10 mL sterile saline or water once per shift. 3. Once drain output has been minimal (no more than the flush volume) for 48 hours, the drainage catheter can be removed. Signed, Criselda Peaches, MD, [facility] Electronically Signed   By: Jacqulynn Cadet M.D.   On: 08/21/2019 11:59    Labs:  CBC: Recent Labs    08/21/19 0714 08/21/19 1915 08/22/19 0500 08/23/19 0446  WBC 11.6* 11.3* 10.2 10.0  HGB 9.2* 9.3* 8.8* 8.9*  HCT 28.6* 28.5* 27.3* 27.8*  PLT 435* 458* 427* 458*    COAGS: Recent Labs    07/24/19 1734 08/20/19 2057  INR 1.5* 1.5*  APTT 26 38*    BMP: Recent Labs    08/21/19 0714 08/21/19 0714 08/21/19 1626 08/21/19 1915 08/22/19 0500 08/23/19 0446  NA 135  --  136  --  135 136  K 3.6  --  3.9  --  3.3* 3.9  CL 101  --  103  --  101 100  CO2 24  --  22  --  24 23  GLUCOSE 110*  --  106*  --  103* 104*  BUN 7  --  7  --  <5* <5*  CALCIUM 8.5*  --  8.4*  --  8.5* 8.8*  CREATININE 0.76   < > 0.80 0.84 0.68 0.67  GFRNONAA >60   < > >60 >60 >60 >60  GFRAA >60   < > >60 >60 >60 >60   < > = values in this interval not displayed.    LIVER FUNCTION TESTS: Recent Labs    08/06/19 0353 08/06/19 0353 08/13/19 1516 08/20/19 2016 08/21/19 1626 08/23/19 0446  BILITOT 0.8  --  1.2 1.2 1.0  --   AST 14*  --  15 19 13*  --   ALT 19  --  '20 17 13  ' --   ALKPHOS 62  --  65 61 51  --   PROT 6.4*  --  6.3* 7.6 6.4*  --   ALBUMIN 1.9*   < > 2.3* 2.1* 1.8* 1.7*   < > = values in this interval not displayed.    Assessment and Plan: Pt with hx persistent rt psoas abscess, s/p drainage 1/26; temp 99.4, WBC nl; hgb 8.9, creat nl; drain fl cx neg to date; if no sig OP over next 48 hrs would repeat CT   Electronically Signed: D. Rowe Robert, PA-C 08/23/2019, 9:17 AM   I spent a total of 15 minutes at the the  patient's bedside AND on the patient's hospital floor or unit, greater than 50% of which was counseling/coordinating care for right psoas fluid collection drain    Patient ID: [name], male   DOB: 03/09/1966, 54 y.o.   MRN: [id number]

## 2019-08-23 NOTE — Telephone Encounter (Signed)
Paperwork completed and returned

## 2019-08-23 NOTE — Consult Note (Signed)
[facility] for Infectious Disease    Date of Admission:  08/20/2019     Total days of antibiotics 28  Nafcillin until 1/25  Piperacillin-tazobactam + Vancomycin 3               Reason for Consult: Sacral wound, H/O MSSA L3-4, L4-5 epidural abscess/osteomyelitis Referring Provider: Cyndia Skeeters  Primary Care Provider: Lesleigh Noe, MD    Assessment: [name] is a 54 y.o. male with known history of MSSA vertebral infection s/p decompressive laminectomy 12/30 by Dr. Saintclair Halsted. Head MRI revealed concern for possible septic emboli from left sided cardiac origin and he has been treated with nafcillin the last 4 weeks. His care is now complicated by psoas muscle abscess and infected sacral wound. His leukocytosis has improved, afebrile x 24h now.   PICC is functioning well at present and his back pain is not worse and seems to be improving.  No new information on blood cultures nor from psoas abscess to date with both reports still pending. Will continue to follow for any growth.   Would suspect that the psoas muscle abscess is likely MSSA but sacral wound source brings up gram negative/anaerobic concern. Will narrow current antimicrobial coverate to Unasyn. This will provide adequate MSSA coverage, gram negative, anaerobic and enterococcus. The infusions will be more often for him but I am hopeful with the help he has at home that this is going to be doable for him until his follow up appointment in February. His MRSA PCR was negative offering good reassurance to stop anti-MRSA coverage and follow.   Likely he has received adequate coverage for possible septic CNS process. He was not bacteremic at presentation.    Plan: 1. Follow up micro from abscess/bloods 2. Change IV antibiotics to Unasyn  3. Stop Vancomycin     Principal Problem:   Infected pressure ulcer Active Problems:   Psoas abscess, right (HCC)   H/O MSSA epidural abscess, L2-L5   S/P lumbar laminectomy  Sepsis (HCC)   Hyponatremia   Alcohol use   . Chlorhexidine Gluconate Cloth  6 each Topical Daily  . enoxaparin (LOVENOX) injection  40 mg Subcutaneous Q24H  . feeding supplement  1 Container Oral TID BM  . folic acid  1 mg Oral Daily  . multivitamin with minerals  1 tablet Oral Daily  . sodium chloride flush  10-40 mL Intracatheter Q12H  . sodium chloride flush  5 mL Intracatheter Q8H  . tamsulosin  0.4 mg Oral QPC supper  . thiamine  100 mg Oral Daily   Or  . thiamine  100 mg Intravenous Daily    HPI: [name] is a 54 y.o. male admitted to the hospital from home to evaluate drainage from buttock wound and buttock/sacral pain.   He has a history of recent admission 12/29 - 1/06 for epidural abscess with cauda equina syndrome L3-4, L4-5 s/p decompressive laminectomy 12/30 by Dr. Saintclair Halsted. He has continued on Nafcillin at home via PICC line and continuous infusion without trouble. There was concern for possible CNS embolization at the time of treatment based on MRI of the brain W and WO contrast. No large vegetations or valvular problems were seen on transthoracic echocardiogram. TEE was not needed for evaluation as it would not have altered management.   He states that he had this wound develop first 2 weeks ago. The pain has been increasing in the days leading up to this current admission  on 1/25 with development of fevers to 103 F. He reports that he has several family members at home to take care of him and help.   In the ER he was febrile 100.7, tachycardic, normotensive, not hypoxic with normal work of breathing. WBC 14.8K. His back pain "is nothing now compared to his buttock pain." He has no changes to sensation in lower extremities, no trouble voiding/passing BM. CT of the pelvis was assessed revealing gas within soft tissue surrounding the lower sacrum/coccyx without drainable foci. Rim-enhancing area to the right psoas muscle concerning for intramuscular abscess - his antibiotics  were broadened to Vancomycin and Zosyn and general surgery and IR were consulted. He has a drain in place for psoas muscle abscess and the wound was manually drained at the bedside by Dr. Donne Hazel but not formally taken to OR. They continue to follow and help with wound care.    Review of Systems: Review of Systems  Constitutional: Positive for chills and fever.  HENT: Negative for sore throat.   Eyes: Negative for blurred vision and redness.  Respiratory: Negative for cough and shortness of breath.   Cardiovascular: Negative for chest pain and leg swelling.  Gastrointestinal: Negative for abdominal pain, diarrhea and heartburn.  Genitourinary: Negative for dysuria and flank pain.  Musculoskeletal: Positive for back pain.  Skin: Negative for itching and rash.  Neurological: Negative for sensory change and weakness.    Past Medical History:  Diagnosis Date  . History of chicken pox   . Medical history non-contributory     Social History   Tobacco Use  . Smoking status: Former Smoker    Packs/day: 1.00    Years: 14.00    Pack years: 14.00    Types: Cigarettes    Quit date: 07/27/1999    Years since quitting: 20.0  . Smokeless tobacco: Never Used  Substance Use Topics  . Alcohol use: Yes    Alcohol/week: 24.0 standard drinks    Types: 24 Cans of beer per week    Comment: 4-5 beers per day, case of beer per week  . Drug use: No    Family History  Problem Relation Age of Onset  . Cancer Father        Hodgkin's disease  . COPD Mother   . Heart attack Maternal Grandfather 80  . Diabetes Neg Hx   . Stroke Neg Hx   . Hypertension Neg Hx   . Hyperlipidemia Neg Hx    Allergies  Allergen Reactions  . Bee Venom Anaphylaxis  . Hydrocodone Nausea And Vomiting    OBJECTIVE: Blood pressure 112/70, pulse 85, temperature 99.4 F (37.4 C), temperature source Oral, resp. rate 18, height 6\' 1"  (1.854 m), weight 103.5 kg, SpO2 100 %.  Physical Exam Vitals and nursing note  reviewed.  Constitutional:      Appearance: Normal appearance. He is not ill-appearing.  HENT:     Mouth/Throat:     Mouth: Mucous membranes are moist.     Pharynx: Oropharynx is clear.  Eyes:     General: No scleral icterus. Cardiovascular:     Rate and Rhythm: Normal rate.     Pulses: Normal pulses.  Pulmonary:     Effort: Pulmonary effort is normal.     Breath sounds: Normal breath sounds.  Abdominal:     General: Abdomen is flat. There is no distension.  Musculoskeletal:     Comments: Sacral wounds assessed - mostly serous drainage, some tinged blood. Painful with palpation but erythema  improved significantly.   Skin:    General: Skin is warm and dry.     Capillary Refill: Capillary refill takes less than 2 seconds.     Comments: PICC line - clean/dry dressing. Insertion site w/o erythema, tenderness, drainage, cording or distal swelling of affected extremity    Neurological:     Mental Status: He is alert and oriented to person, place, and time.  Psychiatric:        Mood and Affect: Mood normal.        Behavior: Behavior normal.    Wound on admission 08/20/19     Lab Results Lab Results  Component Value Date   WBC 10.0 08/23/2019   HGB 8.9 (L) 08/23/2019   HCT 27.8 (L) 08/23/2019   MCV 94.9 08/23/2019   PLT 458 (H) 08/23/2019    Lab Results  Component Value Date   CREATININE 0.67 08/23/2019   BUN <5 (L) 08/23/2019   NA 136 08/23/2019   K 3.9 08/23/2019   CL 100 08/23/2019   CO2 23 08/23/2019    Lab Results  Component Value Date   ALT 13 08/21/2019   AST 13 (L) 08/21/2019   ALKPHOS 51 08/21/2019   BILITOT 1.0 08/21/2019     Microbiology: Recent Results (from the past 240 hour(s))  Blood culture (routine x 2)     Status: None (Preliminary result)   Collection Time: 08/20/19  8:15 PM   Specimen: BLOOD LEFT ARM  Result Value Ref Range Status   Specimen Description BLOOD LEFT ARM  Final   Special Requests   Final    BOTTLES DRAWN AEROBIC AND  ANAEROBIC Blood Culture adequate volume   Culture   Final    NO GROWTH 3 DAYS Performed at [facility], [address] [place], [place] [address]    Report Status PENDING  Incomplete  Blood culture (routine x 2)     Status: None (Preliminary result)   Collection Time: 08/20/19  8:30 PM   Specimen: BLOOD LEFT ARM  Result Value Ref Range Status   Specimen Description BLOOD LEFT ARM  Final   Special Requests   Final    BOTTLES DRAWN AEROBIC ONLY Blood Culture results may not be optimal due to an excessive volume of blood received in culture bottles   Culture   Final    NO GROWTH 3 DAYS Performed at [facility], [place] [address] [place], [place] [address]    Report Status PENDING  Incomplete  Respiratory Panel by RT PCR (Flu A&B, Covid) - Nasopharyngeal Swab     Status: None   Collection Time: 08/20/19 10:51 PM   Specimen: Nasopharyngeal Swab  Result Value Ref Range Status   SARS Coronavirus 2 by RT PCR NEGATIVE NEGATIVE Final    Comment: (NOTE) SARS-CoV-2 target nucleic acids are NOT DETECTED. The SARS-CoV-2 RNA is generally detectable in upper respiratoy specimens during the acute phase of infection. The lowest concentration of SARS-CoV-2 viral copies this assay can detect is 131 copies/mL. A negative result does not preclude SARS-Cov-2 infection and should not be used as the sole basis for treatment or other patient management decisions. A negative result may occur with  improper specimen collection/handling, submission of specimen other than nasopharyngeal swab, presence of viral mutation(s) within the areas targeted by this assay, and inadequate number of viral copies (<131 copies/mL). A negative result must be combined with clinical observations, patient history, and epidemiological information. The expected result is Negative. Fact Sheet for  Patients:  PinkCheek.be Fact Sheet for Healthcare Providers:    GravelBags.it This test is not yet ap proved or cleared by the [place] and  has been authorized for detection and/or diagnosis of SARS-CoV-2 by FDA under an Emergency Use Authorization (EUA). This EUA will remain  in effect (meaning this test can be used) for the duration of the COVID-19 declaration under Section 564(b)(1) of the Act, 21 U.S.C. section 360bbb-3(b)(1), unless the authorization is terminated or revoked sooner.    Influenza A by PCR NEGATIVE NEGATIVE Final   Influenza B by PCR NEGATIVE NEGATIVE Final    Comment: (NOTE) The Xpert Xpress SARS-CoV-2/FLU/RSV assay is intended as an aid in  the diagnosis of influenza from Nasopharyngeal swab specimens and  should not be used as a sole basis for treatment. Nasal washings and  aspirates are unacceptable for Xpert Xpress SARS-CoV-2/FLU/RSV  testing. Fact Sheet for Patients: PinkCheek.be Fact Sheet for Healthcare Providers: GravelBags.it This test is not yet approved or cleared by the [place] FDA and  has been authorized for detection and/or diagnosis of SARS-CoV-2 by  FDA under an Emergency Use Authorization (EUA). This EUA will remain  in effect (meaning this test can be used) for the duration of the  Covid-19 declaration under Section 564(b)(1) of the Act, 21  U.S.C. section 360bbb-3(b)(1), unless the authorization is  terminated or revoked. Performed at [facility], [place] [address], [place], [place] [address]   MRSA PCR Screening     Status: None   Collection Time: 08/21/19  5:20 AM   Specimen: Nasopharyngeal  Result Value Ref Range Status   MRSA by PCR NEGATIVE NEGATIVE Final    Comment:        The GeneXpert MRSA Assay (FDA approved for NASAL specimens only), is one component of a comprehensive MRSA colonization surveillance program. It is not intended to diagnose MRSA infection nor to guide or monitor  treatment for MRSA infections. Performed at [facility], [place] [address] [place], [place] [address]   Aerobic/Anaerobic Culture (surgical/deep wound)     Status: None (Preliminary result)   Collection Time: 08/21/19 11:22 AM   Specimen: Abscess  Result Value Ref Range Status   Specimen Description ABSCESS  Final   Special Requests Normal  Final   Gram Stain   Final    MODERATE WBC PRESENT, PREDOMINANTLY MONONUCLEAR NO ORGANISMS SEEN    Culture   Final    NO GROWTH < 24 HOURS Performed at [facility], [place] [address], [place], [place] [address]    Report Status PENDING  Incomplete  Urine culture     Status: None   Collection Time: 08/22/19 12:53 PM   Specimen: In/Out Cath Urine  Result Value Ref Range Status   Specimen Description IN/OUT CATH URINE  Final   Special Requests NONE  Final   Culture   Final    NO GROWTH Performed at [facility], [place] [address], [place],  [address]    Report Status 08/23/2019 FINAL  Final    Janene Madeira, MSN, NP-C [place] [facility] Cell: [phone] Pager: [phone]  08/23/2019 11:14 AM

## 2019-08-23 NOTE — Progress Notes (Signed)
Subjective: CC: Pain improved from yesterday.   Objective: Vital signs in last 24 hours: Temp:  [98 F (36.7 C)-99.4 F (37.4 C)] 99.4 F (37.4 C) (01/28 0502) Pulse Rate:  [84-91] 85 (01/28 0502) Resp:  [18] 18 (01/28 0502) BP: (112-114)/(66-70) 112/70 (01/28 0502) SpO2:  [97 %-100 %] 100 % (01/28 0502) Weight:  [103.5 kg] 103.5 kg (01/28 0502) Last BM Date: 08/22/19  Intake/Output from previous day: 01/27 0701 - 01/28 0700 In: 1590.2 [P.O.:1180; IV Piggyback:410.2] Out: 2090 [Urine:2075; Drains:15] Intake/Output this shift: No intake/output data recorded.  PE: Gen: Awake and alert, NAD Abd: Soft, ND, NT Sacral wound: Chaperone present - See picture below. Two openings. Probed with mild tracking. Would appears to continue to improve daily. Redness surrounding appears 2/2 pressure rather than infection. There is no heat or induration. Some mild brown drainage on dressing.      Lab Results:  Recent Labs    08/22/19 0500 08/23/19 0446  WBC 10.2 10.0  HGB 8.8* 8.9*  HCT 27.3* 27.8*  PLT 427* 458*   BMET Recent Labs    08/22/19 0500 08/23/19 0446  NA 135 136  K 3.3* 3.9  CL 101 100  CO2 24 23  GLUCOSE 103* 104*  BUN <5* <5*  CREATININE 0.68 0.67  CALCIUM 8.5* 8.8*   PT/INR Recent Labs    08/20/19 2057  LABPROT 18.2*  INR 1.5*   CMP     Component Value Date/Time   NA 136 08/23/2019 0446   K 3.9 08/23/2019 0446   CL 100 08/23/2019 0446   CO2 23 08/23/2019 0446   GLUCOSE 104 (H) 08/23/2019 0446   BUN <5 (L) 08/23/2019 0446   CREATININE 0.67 08/23/2019 0446   CALCIUM 8.8 (L) 08/23/2019 0446   PROT 6.4 (L) 08/21/2019 1626   ALBUMIN 1.7 (L) 08/23/2019 0446   AST 13 (L) 08/21/2019 1626   ALT 13 08/21/2019 1626   ALKPHOS 51 08/21/2019 1626   BILITOT 1.0 08/21/2019 1626   GFRNONAA >60 08/23/2019 0446   GFRAA >60 08/23/2019 0446   Lipase  No results found for: LIPASE     Studies/Results: DG CHEST PORT 1 VIEW  Result Date:  08/21/2019 CLINICAL DATA:  PICC placement EXAM: PORTABLE CHEST 1 VIEW COMPARISON:  07/24/2019 FINDINGS: Right arm PICC tip in the mid SVC. Lungs are clear without infiltrate effusion or edema. Cardiac enlargement. IMPRESSION: PICC tip in the mid SVC. Electronically Signed   By: Franchot Gallo M.D.   On: 08/21/2019 16:44   CT IMAGE GUIDED FLUID DRAIN BY CATHETER  Result Date: 08/21/2019 INDICATION: 54 year old male with a persistent right psoas abscess despite prior CT-guided aspiration on 07/25/2019. He presents for CT-guided drain placement. EXAM: CT-guided drain placement MEDICATIONS: The patient is currently admitted to the hospital and receiving intravenous antibiotics. The antibiotics were administered within an appropriate time frame prior to the initiation of the procedure. ANESTHESIA/SEDATION: Fentanyl 100 mcg IV; Versed 2 mg IV Moderate Sedation Time:  20 minutes The patient was continuously monitored during the procedure by the interventional radiology nurse under my direct supervision. COMPLICATIONS: None immediate. PROCEDURE: Informed written consent was obtained from the patient after a thorough discussion of the procedural risks, benefits and alternatives. All questions were addressed. Maximal Sterile Barrier Technique was utilized including caps, mask, sterile gowns, sterile gloves, sterile drape, hand hygiene and skin antiseptic. A timeout was performed prior to the initiation of the procedure. The patient was placed prone on the gantry table.  A limited axial CT scan was performed. The low-attenuation collection in the right psoas muscle was successfully identified. A suitable skin entry site was selected and marked. The overlying skin was sterilely prepped and draped in the standard fashion using chlorhexidine skin prep. Local anesthesia was attained by infiltration with 1% lidocaine. A small dermatotomy was made. Under intermittent CT guidance, an 18 gauge trocar needle was carefully advanced  along a right paraspinal approach into the fluid collection. A 0.035 wire was then coiled in the fluid collection. The tract was dilated to 10 [place] and a [place] all-purpose drainage catheter was advanced over the wire and formed within the fluid collection. Aspiration yields 25 mL turbid yellow fluid. Samples were sent for culture. The drainage catheter was secured to the skin with 0 Prolene suture and connected to JP bulb suction. Post drain placement CT imaging demonstrates a well-positioned drainage catheter with significant reduction in the volume of the intra psoas fluid collection. IMPRESSION: Successful placement of a 12 French drainage catheter into the right psoas fluid collection with aspiration of 25 mL turbid yellow fluid. Samples were sent for Gram stain and culture. PLAN: 1. Maintain drain to JP bulb suction. 2. Flush drain with 5-10 mL sterile saline or water once per shift. 3. Once drain output has been minimal (no more than the flush volume) for 48 hours, the drainage catheter can be removed. Signed, Criselda Peaches, MD, [facility] Radiology Electronically Signed   By: Jacqulynn Cadet M.D.   On: 08/21/2019 11:59    Anti-infectives: Anti-infectives (From admission, onward)   Start     Dose/Rate Route Frequency Ordered Stop   08/21/19 0900  vancomycin (VANCOREADY) IVPB 1250 mg/250 mL     1,250 mg 166.7 mL/hr over 90 Minutes Intravenous Every 12 hours 08/20/19 2114     08/21/19 0400  piperacillin-tazobactam (ZOSYN) IVPB 3.375 g     3.375 g 12.5 mL/hr over 240 Minutes Intravenous Every 8 hours 08/20/19 2314     08/20/19 2100  vancomycin (VANCOCIN) 2,500 mg in sodium chloride 0.9 % 500 mL IVPB     2,500 mg 250 mL/hr over 120 Minutes Intravenous  Once 08/20/19 2059 08/20/19 2321   08/20/19 2045  piperacillin-tazobactam (ZOSYN) IVPB 3.375 g     3.375 g 100 mL/hr over 30 Minutes Intravenous  Once 08/20/19 2037 08/20/19  2130       Assessment/Plan Lumbar epidural abscess cauda equina syndrome severe spinal stenosis L3-4 L4-5 s/p decompressive lumbar laminectomies L3-4 L4-5 with partial medial facetectomies and foraminotomies of the L3-L4 and L5 nerve roots 07/25/2019 - Dr. Saintclair Halsted  MSSA bacteremia   Right psoas abscess - s/p IR drainage. Plan per IR.   Sepsis Sacral pressure ulcer with superinfection - Continue broad spectrum antibiotics.  - No indication for emergent/urgent surgery at this time - Cleaning up well. Continue dressing changes TID.  - Air mattress, turn patient q 2 hours - We will continue to follow   ID - zosyn/vancomycin 1/25>>  FEN - IVF, NPO VTE - SCDs, per primary/ok for chemical DVT prophylaxis from surgical standpoint Foley - none Follow up - TBD   LOS: 3 days    Jillyn Ledger , [facility] 08/23/2019, 9:36 AM Please see Amion for pager number during day hours 7:00am-4:30pm

## 2019-08-23 NOTE — Progress Notes (Signed)
Physical Therapy Treatment Patient Details Name: [name] MRN: [id number] DOB: Jun 23, 1966 Today's Date: 08/23/2019    History of Present Illness [name] is a 54 y.o. male presenting with complaints of drainage from right buttock wound. Patient had a recent hospital admission 12/29-1/6 for lumbar epidural abscess, cauda equina syndrome, severe spinal stenosis L3-4 L4-5 status post decompressive lumbar laminectomy 3-4,4-5 for evacuation of epidural abscess on 12/30 by Dr. Saintclair Halsted and CT aspiration of right psoas abscess per interventional radiology on 12/30.  Found to have MSSA bacteremia. Pt went to CIR and then home on 1/15, returns with drainage and pain in buttocks region.     PT Comments    Patient continues to make progress toward PT goals and tolerated an increase in gait distance this session. Pt reports 7/10 pain with mobility and heavily reliant on bilat UE support for OOB mobility. Continue to progress as tolerated.    Follow Up Recommendations  Home health PT;Supervision/Assistance - 24 hour     Equipment Recommendations  None recommended by PT    Recommendations for Other Services       Precautions / Restrictions Precautions Precautions: Fall;Back Precaution Comments: JP drain    Mobility  Bed Mobility Overal bed mobility: Needs Assistance Bed Mobility: Sit to Sidelying;Rolling Rolling: Supervision       Sit to sidelying: Min assist;Mod assist General bed mobility comments: daughter assisted with return to bed but pt needed assist to bring bilat LE into bed   Transfers Overall transfer level: Needs assistance Equipment used: Rolling walker (2 wheeled) Transfers: Sit to/from Stand Sit to Stand: Min guard         General transfer comment: min guard for safety from elevated bed  Ambulation/Gait Ambulation/Gait assistance: Min guard Gait Distance (Feet): 100 Feet Assistive device: Rolling walker (2 wheeled) Gait Pattern/deviations: Step-through  pattern;Trunk flexed;Decreased stride length;Narrow base of support Gait velocity: decreased   General Gait Details: cues for upright posture; heavy reliance on bilat UE support    Stairs             Wheelchair Mobility    Modified Rankin (Stroke Patients Only)       Balance Overall balance assessment: Needs assistance   Sitting balance-Leahy Scale: Good     Standing balance support: Bilateral upper extremity supported;During functional activity Standing balance-Leahy Scale: Poor                              Cognition Arousal/Alertness: Awake/alert Behavior During Therapy: WFL for tasks assessed/performed Overall Cognitive Status: Within Functional Limits for tasks assessed                                        Exercises      General Comments General comments (skin integrity, edema, etc.): Pt SOB with mobility and HR up to 125 bpm with ambulation      Pertinent Vitals/Pain Pain Assessment: 0-10 Pain Score: 7  Pain Location: buttocks Pain Descriptors / Indicators: Grimacing;Guarding Pain Intervention(s): Limited activity within patient's tolerance;Monitored during session;Repositioned    Home Living                      Prior Function            PT Goals (current goals can now be found in the care plan section) Progress  towards PT goals: Progressing toward goals    Frequency    Min 3X/week      PT Plan Current plan remains appropriate    Co-evaluation              AM-PAC PT "6 Clicks" Mobility   Outcome Measure  Help needed turning from your back to your side while in a flat bed without using bedrails?: A Little Help needed moving from lying on your back to sitting on the side of a flat bed without using bedrails?: A Little Help needed moving to and from a bed to a chair (including a wheelchair)?: A Little Help needed standing up from a chair using your arms (e.g., wheelchair or bedside chair)?:  A Little Help needed to walk in hospital room?: A Little Help needed climbing 3-5 steps with a railing? : A Lot 6 Click Score: 17    End of Session Equipment Utilized During Treatment: Gait belt Activity Tolerance: Patient tolerated treatment well Patient left: with call bell/phone within reach;with family/visitor present;in bed Nurse Communication: Mobility status PT Visit Diagnosis: Muscle weakness (generalized) (M62.81);Difficulty in walking, not elsewhere classified (R26.2);Pain;Unsteadiness on feet (R26.81);Other symptoms and signs involving the nervous system (R29.898) Pain - part of body: (buttocks)     Time: [name] PT Time Calculation (min) (ACUTE ONLY): 22 min  Charges:  $Gait Training: 8-22 mins                     Earney Navy, PTA Acute Rehabilitation Services Pager: [phone] Office: [phone]     Darliss Cheney 08/23/2019, 3:49 PM

## 2019-08-23 NOTE — Telephone Encounter (Signed)
Paperwork faxed Daughter aware  Copy for pt/son/daughter Copy for scan Copy for billing

## 2019-08-24 DIAGNOSIS — L8990 Pressure ulcer of unspecified site, unspecified stage: Secondary | ICD-10-CM

## 2019-08-24 DIAGNOSIS — L089 Local infection of the skin and subcutaneous tissue, unspecified: Secondary | ICD-10-CM

## 2019-08-24 LAB — CBC
HCT: 29.1 % — ABNORMAL LOW (ref 39.0–52.0)
Hemoglobin: 9.2 g/dL — ABNORMAL LOW (ref 13.0–17.0)
MCH: 30.4 pg (ref 26.0–34.0)
MCHC: 31.6 g/dL (ref 30.0–36.0)
MCV: 96 fL (ref 80.0–100.0)
Platelets: 483 10*3/uL — ABNORMAL HIGH (ref 150–400)
RBC: 3.03 MIL/uL — ABNORMAL LOW (ref 4.22–5.81)
RDW: 14.1 % (ref 11.5–15.5)
WBC: 8 10*3/uL (ref 4.0–10.5)
nRBC: 0 % (ref 0.0–0.2)

## 2019-08-24 LAB — RENAL FUNCTION PANEL
Albumin: 1.9 g/dL — ABNORMAL LOW (ref 3.5–5.0)
Anion gap: 10 (ref 5–15)
BUN: <5 mg/dL — ABNORMAL LOW (ref 6–20)
CO2: 24 mmol/L (ref 22–32)
Calcium: 8.7 mg/dL — ABNORMAL LOW (ref 8.9–10.3)
Chloride: 102 mmol/L (ref 98–111)
Creatinine, Ser: 0.63 mg/dL (ref 0.61–1.24)
GFR calc Af Amer: >60 mL/min (ref >60)
GFR calc non Af Amer: >60 mL/min (ref >60)
Glucose, Bld: 109 mg/dL — ABNORMAL HIGH (ref 70–99)
Phosphorus: 4.8 mg/dL — ABNORMAL HIGH (ref 2.5–4.6)
Potassium: 3.5 mmol/L (ref 3.5–5.1)
Sodium: 136 mmol/L (ref 135–145)

## 2019-08-24 LAB — MAGNESIUM: Magnesium: 2.2 mg/dL (ref 1.7–2.4)

## 2019-08-24 NOTE — Progress Notes (Signed)
[place] in chair, dressings clean and dry no drainage. Alert, pain "ok" no request for pain med.   1340 changed sacral dressing (ABD pad), soiled during toileting. Minimal serosangineous drainage on ABD pad.   Paged [facility] at [id number] 1340 HR increased to 140's w/ ambulation with RN to bathroom,no distress.HR 80's-90's upon rest.  Sacral and buttock wound dressing changed at 1750. Moderate serosangineous drainage on ABD pad. New dressing applied. JP drained flushed and 20 mL serosangineous drainage emptied from bulb.

## 2019-08-24 NOTE — Final Consult Note (Signed)
Consultant Final Sign-Off Note    Assessment/Final recommendations  [name] is a 54 y.o. male followed by me for Sacral pressure ulcer.  - Abx per ID - No indication for emergent/urgent surgery. - Cleaning up well. Continue dressing changes TID.  - Air mattress, turn patient q 2 hours to relieve pressure  - We will sign off. Thank you for allowing [place] to participate in the care of your patient!  Please consult [place] again if you have further needs for your patient.  Wound care (if applicable): WTD TID   Diet at discharge: per primary team   Activity at discharge: per primary team   Follow-up appointment:  Wound care clinic    Pending results:  Unresulted Labs (From admission, onward)    Start     Ordered   08/28/19 0500  Creatinine, serum  (enoxaparin (LOVENOX)    CrCl >/= 30 ml/min)  Weekly,   R    Comments: while on enoxaparin therapy    08/21/19 1730   08/23/19 0500  Renal function panel  Daily,   R    Question:  Specimen collection method  Answer:  Unit=Unit collect   08/22/19 1758   08/23/19 0500  Magnesium  Daily,   R    Question:  Specimen collection method  Answer:  Unit=Unit collect   08/22/19 1758   08/23/19 0500  CBC  Daily,   R    Question:  Specimen collection method  Answer:  Unit=Unit collect   08/22/19 1758           Medication recommendations: Abx per ID   Other recommendations:    Thank you for allowing [place] to participate in the care of your patient!  Please consult [place] again if you have further needs for your patient.  Barth Kirks [facility] 08/24/2019 9:54 AM    Subjective   No pain over sacrum. Tolerating dressing changes but has been when trying to move over onto his side for them.   Objective  Vital signs in last 24 hours: Temp:  [97.8 F (36.6 C)-98.7 F (37.1 C)] 97.8 F (36.6 C) (01/29 0855) Pulse Rate:  [79-98] 86 (01/29 0855) Resp:  [16-18] 18 (01/29 0333) BP: (92-122)/(69-84) 122/84 (01/29 0855) SpO2:  [98 %-100 %] 100 % (01/29  0855) Weight:  [103.4 kg] 103.4 kg (01/29 0300)  Gen: Awake and alert, NAD Abd: Soft, ND, NT Sacral wound: Two openings noted. Probed with mild tracking. Would has significantly improved based on my exam and pictures in the chart. Redness surrounding appears 2/2 pressure rather than infection now. There is no heat or induration. Some mild brown drainage on dressing. No purulence.      Pertinent labs and Studies: Recent Labs    08/22/19 0500 08/23/19 0446 08/24/19 0548  WBC 10.2 10.0 8.0  HGB 8.8* 8.9* 9.2*  HCT 27.3* 27.8* 29.1*   BMET Recent Labs    08/23/19 0446 08/24/19 0548  NA 136 136  K 3.9 3.5  CL 100 102  CO2 23 24  GLUCOSE 104* 109*  BUN <5* <5*  CREATININE 0.67 0.63  CALCIUM 8.8* 8.7*   No results for input(s): LABURIN in the last 72 hours. Results for orders placed or performed during the hospital encounter of 08/20/19  Blood culture (routine x 2)     Status: None (Preliminary result)   Collection Time: 08/20/19  8:15 PM   Specimen: BLOOD LEFT ARM  Result Value Ref Range Status   Specimen Description BLOOD LEFT  ARM  Final   Special Requests   Final    BOTTLES DRAWN AEROBIC AND ANAEROBIC Blood Culture adequate volume   Culture   Final    NO GROWTH 4 DAYS Performed at [facility], [address], [place], [place] [address]    Report Status PENDING  Incomplete  Blood culture (routine x 2)     Status: None (Preliminary result)   Collection Time: 08/20/19  8:30 PM   Specimen: BLOOD LEFT ARM  Result Value Ref Range Status   Specimen Description BLOOD LEFT ARM  Final   Special Requests   Final    BOTTLES DRAWN AEROBIC ONLY Blood Culture results may not be optimal due to an excessive volume of blood received in culture bottles   Culture   Final    NO GROWTH 4 DAYS Performed at [facility], [place] [address] [place], [place] [address]    Report Status PENDING  Incomplete  Respiratory Panel by RT PCR (Flu A&B, Covid) - Nasopharyngeal Swab      Status: None   Collection Time: 08/20/19 10:51 PM   Specimen: Nasopharyngeal Swab  Result Value Ref Range Status   SARS Coronavirus 2 by RT PCR NEGATIVE NEGATIVE Final    Comment: (NOTE) SARS-CoV-2 target nucleic acids are NOT DETECTED. The SARS-CoV-2 RNA is generally detectable in upper respiratoy specimens during the acute phase of infection. The lowest concentration of SARS-CoV-2 viral copies this assay can detect is 131 copies/mL. A negative result does not preclude SARS-Cov-2 infection and should not be used as the sole basis for treatment or other patient management decisions. A negative result may occur with  improper specimen collection/handling, submission of specimen other than nasopharyngeal swab, presence of viral mutation(s) within the areas targeted by this assay, and inadequate number of viral copies (<131 copies/mL). A negative result must be combined with clinical observations, patient history, and epidemiological information. The expected result is Negative. Fact Sheet for Patients:  PinkCheek.be Fact Sheet for Healthcare Providers:  GravelBags.it This test is not yet ap proved or cleared by the [place] FDA and  has been authorized for detection and/or diagnosis of SARS-CoV-2 by FDA under an Emergency Use Authorization (EUA). This EUA will remain  in effect (meaning this test can be used) for the duration of the COVID-19 declaration under Section 564(b)(1) of the Act, 21 U.S.C. section 360bbb-3(b)(1), unless the authorization is terminated or revoked sooner.    Influenza A by PCR NEGATIVE NEGATIVE Final   Influenza B by PCR NEGATIVE NEGATIVE Final    Comment: (NOTE) The Xpert Xpress SARS-CoV-2/FLU/RSV assay is intended as an aid in  the diagnosis of influenza from Nasopharyngeal swab specimens and  should not be used as a sole basis for treatment. Nasal washings and  aspirates are unacceptable for  Xpert Xpress SARS-CoV-2/FLU/RSV  testing. Fact Sheet for Patients: PinkCheek.be Fact Sheet for Healthcare Providers: GravelBags.it This test is not yet approved or cleared by the [place] FDA and  has been authorized for detection and/or diagnosis of SARS-CoV-2 by  FDA under an Emergency Use Authorization (EUA). This EUA will remain  in effect (meaning this test can be used) for the duration of the  Covid-19 declaration under Section 564(b)(1) of the Act, 21  U.S.C. section 360bbb-3(b)(1), unless the authorization is  terminated or revoked. Performed at [facility], [place] [address], [place], [place] [address]   MRSA PCR Screening     Status: None   Collection Time:  08/21/19  5:20 AM   Specimen: Nasopharyngeal  Result Value Ref Range Status   MRSA by PCR NEGATIVE NEGATIVE Final    Comment:        The GeneXpert MRSA Assay (FDA approved for NASAL specimens only), is one component of a comprehensive MRSA colonization surveillance program. It is not intended to diagnose MRSA infection nor to guide or monitor treatment for MRSA infections. Performed at [facility], [place] [address], [place], [place] [address]   Aerobic/Anaerobic Culture (surgical/deep wound)     Status: None (Preliminary result)   Collection Time: 08/21/19 11:22 AM   Specimen: Abscess  Result Value Ref Range Status   Specimen Description ABSCESS  Final   Special Requests Normal  Final   Gram Stain   Final    MODERATE WBC PRESENT, PREDOMINANTLY MONONUCLEAR NO ORGANISMS SEEN    Culture   Final    NO GROWTH 2 DAYS NO ANAEROBES ISOLATED; CULTURE IN PROGRESS FOR 5 DAYS Performed at [facility], [place] [address], [place], [place] [address]    Report Status PENDING  Incomplete  Urine culture     Status: None   Collection Time: 08/22/19 12:53 PM   Specimen: In/Out Cath Urine  Result Value Ref Range Status   Specimen Description IN/OUT  CATH URINE  Final   Special Requests NONE  Final   Culture   Final    NO GROWTH Performed at [facility], [place] [address], [place], [place] [address]    Report Status 08/23/2019 FINAL  Final    Imaging: No results found.

## 2019-08-24 NOTE — Progress Notes (Signed)
PT Cancellation Note  Patient Details Name: [name] MRN: [id number] DOB: 27-Aug-1965   Cancelled Treatment:    Reason Eval/Treat Not Completed: Fatigue/lethargy limiting ability to participate Pt reports he just got back to bed and walked earlier in the room with OT. Will follow.   Marguarite Arbour A Brayln Duque 08/24/2019, 1:57 PM Marisa Severin, PT, DPT Acute Rehabilitation Services Pager [phone] Office [phone]

## 2019-08-24 NOTE — Progress Notes (Signed)
PHARMACY CONSULT NOTE FOR:  OUTPATIENT  PARENTERAL ANTIBIOTIC THERAPY (OPAT)  Indication: Wound Infection Regimen: Unasyn 3g IV Q6 hrs End date: 09/18/19  IV antibiotic discharge orders are pended. To discharging provider:  please sign these orders via discharge navigator,  Select New Orders & click on the button choice - Manage This Unsigned Work.     Richardine Service, PharmD PGY1 Pharmacy Resident Phone: [phone] 08/24/2019  10:04 AM  Please check [email or web address] for unit-specific pharmacy phone numbers.

## 2019-08-24 NOTE — Progress Notes (Signed)
Referring Physician(s): Dr. Marlowe Sax  Supervising Physician: Sandi Mariscal  Patient Status:  [facility] - In-pt  Chief Complaint: Psoas abscess  Subjective: Resting comfortably.  States his back hurts with movement.  No complaints related to drain.   Allergies: Bee venom and Hydrocodone  Medications: Prior to Admission medications   Medication Sig Start Date End Date Taking? Authorizing Provider  acetaminophen (TYLENOL) 325 MG tablet Take 2 tablets (650 mg total) by mouth every 4 (four) hours as needed for mild pain ((score 1 to 3) or temp > 100.5). 08/09/19  Yes Angiulli, Lavon Paganini, PA-C  cyclobenzaprine (FLEXERIL) 10 MG tablet Take 1 tablet (10 mg total) by mouth 3 (three) times daily as needed for muscle spasms. 08/09/19  Yes Angiulli, Lavon Paganini, PA-C  nafcillin IVPB Inject 12 g into the vein daily. As a continuous infusion Indication:  Disseminated MSSA infection Last Day of Therapy:  09/04/19 Labs - Once weekly:  CBC/D and BMP, Labs - Every other week:  ESR and CRP 08/07/19 09/11/19 Yes Angiulli, Lavon Paganini, PA-C  Oxycodone HCl 10 MG TABS Take 1 tablet (10 mg total) by mouth every 4 (four) hours as needed ((score 7 to 10)). Patient taking differently: Take 10 mg by mouth every 4 (four) hours as needed (For pain).  08/20/19  Yes Lovorn, Jinny Blossom, MD  bisacodyl (DULCOLAX) 10 MG suppository Place 1 suppository (10 mg total) rectally daily as needed for moderate constipation. Patient not taking: Reported on 08/20/2019 08/09/19   Angiulli, Lavon Paganini, PA-C  Multiple Vitamin (MULTIVITAMIN WITH MINERALS) TABS tablet Take 1 tablet by mouth daily. Patient not taking: Reported on 08/20/2019 08/10/19   Angiulli, Lavon Paganini, PA-C  pantoprazole (PROTONIX) 40 MG tablet Take 1 tablet (40 mg total) by mouth at bedtime. Patient not taking: Reported on 08/20/2019 08/09/19   Angiulli, Lavon Paganini, PA-C  polyethylene glycol (MIRALAX / GLYCOLAX) 17 g packet Take 17 g by mouth daily as needed for mild constipation. Patient not  taking: Reported on 08/20/2019 08/09/19   Angiulli, Lavon Paganini, PA-C  predniSONE (DELTASONE) 5 MG tablet Take 1 tablet (5 mg total) by mouth daily with breakfast. Patient not taking: Reported on 08/20/2019 08/10/19   Angiulli, Lavon Paganini, PA-C  senna (SENOKOT) 8.6 MG TABS tablet Take 2 tablets (17.2 mg total) by mouth daily. Patient not taking: Reported on 08/20/2019 08/10/19   Angiulli, Lavon Paganini, PA-C  sodium chloride 0.9 % SOLN 452 mL with nafcillin 2 g SOLR 12 g Inject 12 g into the vein daily. Patient not taking: Reported on 08/20/2019 08/09/19   Angiulli, Lavon Paganini, PA-C  tamsulosin (FLOMAX) 0.4 MG CAPS capsule Take 1 capsule (0.4 mg total) by mouth daily after supper. Patient not taking: Reported on 08/20/2019 08/09/19   Angiulli, Lavon Paganini, PA-C  traZODone (DESYREL) 50 MG tablet Take 1 tablet (50 mg total) by mouth at bedtime as needed for sleep. Patient not taking: Reported on 08/20/2019 08/09/19   Cathlyn Parsons, PA-C     Vital Signs: BP 110/73 (BP Location: Left Arm)   Pulse 86   Temp 98.8 F (37.1 C) (Oral)   Resp 18   Ht '6\' 1"'  (1.854 m)   Wt 228 lb (103.4 kg)   SpO2 92%   BMI 30.08 kg/m   Physical Exam  NAD, alert Back:  Right-sided drain in place.  Output blood-tinged. 15 mL output yesterday. Insertion site c/d/i.   Imaging: CT PELVIS W CONTRAST  Result Date: 08/20/2019 CLINICAL DATA:  Right buttock abscess EXAM:  CT PELVIS WITH CONTRAST TECHNIQUE: Multidetector CT imaging of the pelvis was performed using the standard protocol following the bolus administration of intravenous contrast. CONTRAST:  127m OMNIPAQUE IOHEXOL 300 MG/ML  SOLN COMPARISON:  07/19/2019 FINDINGS: Urinary Tract:  No abnormality visualized. Bowel:  Unremarkable visualized pelvic bowel loops. Vascular/Lymphatic: Atherosclerosis.  No aneurysm or adenopathy. Reproductive: Poorly visualized due to beam hardening artifact from bilateral hip replacements. Other:  No free fluid or free air. Musculoskeletal: Image quality  in the pelvis is degraded due to extensive beam hardening artifact from bilateral hip replacements. There is gas noted throughout the soft tissues surrounding the lower sacrum and coccyx. No visible drainable fluid collection, but the gas throughout the soft tissues is compatible with infection, possibly with gas-forming organism. In addition, there is a rim enhancing low-density area within the right psoas muscle measuring 3.2 x 2.6 cm on axial image 13. On coronal image 76, this extends off the superior aspect of the study. The visualized portion has a craniocaudal length of 5.4 cm. IMPRESSION: Gas within the soft tissue surrounding the lower sacrum and coccyx concerning for infection with gas-forming organism. No visible drainable fluid collection. Evaluation of this area is limited due to beam hardening artifact from bilateral hip replacements. Rim enhancing low-density area within the right psoas muscle which concerning for intramuscular abscess. Full extent extends above the 1st image for this CT pelvis. Electronically Signed   By: KRolm BaptiseM.D.   On: 08/20/2019 21:45   DG CHEST PORT 1 VIEW  Result Date: 08/21/2019 CLINICAL DATA:  PICC placement EXAM: PORTABLE CHEST 1 VIEW COMPARISON:  07/24/2019 FINDINGS: Right arm PICC tip in the mid SVC. Lungs are clear without infiltrate effusion or edema. Cardiac enlargement. IMPRESSION: PICC tip in the mid SVC. Electronically Signed   By: CFranchot GalloM.D.   On: 08/21/2019 16:44   CT IMAGE GUIDED FLUID DRAIN BY CATHETER  Result Date: 08/21/2019 INDICATION: 54year old male with a persistent right psoas abscess despite prior CT-guided aspiration on 07/25/2019. He presents for CT-guided drain placement. EXAM: CT-guided drain placement MEDICATIONS: The patient is currently admitted to the hospital and receiving intravenous antibiotics. The antibiotics were administered within an appropriate time frame prior to the initiation of the procedure.  ANESTHESIA/SEDATION: Fentanyl 100 mcg IV; Versed 2 mg IV Moderate Sedation Time:  20 minutes The patient was continuously monitored during the procedure by the interventional radiology nurse under my direct supervision. COMPLICATIONS: None immediate. PROCEDURE: Informed written consent was obtained from the patient after a thorough discussion of the procedural risks, benefits and alternatives. All questions were addressed. Maximal Sterile Barrier Technique was utilized including caps, mask, sterile gowns, sterile gloves, sterile drape, hand hygiene and skin antiseptic. A timeout was performed prior to the initiation of the procedure. The patient was placed prone on the gantry table. A limited axial CT scan was performed. The low-attenuation collection in the right psoas muscle was successfully identified. A suitable skin entry site was selected and marked. The overlying skin was sterilely prepped and draped in the standard fashion using chlorhexidine skin prep. Local anesthesia was attained by infiltration with 1% lidocaine. A small dermatotomy was made. Under intermittent CT guidance, an 18 gauge trocar needle was carefully advanced along a right paraspinal approach into the fluid collection. A 0.035 wire was then coiled in the fluid collection. The tract was dilated to 10 [place]purpose drainage catheter was advanced over the wire and formed within the fluid collection. Aspiration yields  25 mL turbid yellow fluid. Samples were sent for culture. The drainage catheter was secured to the skin with 0 Prolene suture and connected to JP bulb suction. Post drain placement CT imaging demonstrates a well-positioned drainage catheter with significant reduction in the volume of the intra psoas fluid collection. IMPRESSION: Successful placement of a 12 French drainage catheter into the right psoas fluid collection with aspiration of 25 mL turbid yellow fluid. Samples were sent for Gram stain and  culture. PLAN: 1. Maintain drain to JP bulb suction. 2. Flush drain with 5-10 mL sterile saline or water once per shift. 3. Once drain output has been minimal (no more than the flush volume) for 48 hours, the drainage catheter can be removed. Signed, Criselda Peaches, MD, [facility] Electronically Signed   By: Jacqulynn Cadet M.D.   On: 08/21/2019 11:59    Labs:  CBC: Recent Labs    08/21/19 1915 08/22/19 0500 08/23/19 0446 08/24/19 0548  WBC 11.3* 10.2 10.0 8.0  HGB 9.3* 8.8* 8.9* 9.2*  HCT 28.5* 27.3* 27.8* 29.1*  PLT 458* 427* 458* 483*    COAGS: Recent Labs    07/24/19 1734 08/20/19 2057  INR 1.5* 1.5*  APTT 26 38*    BMP: Recent Labs    08/21/19 1626 08/21/19 1626 08/21/19 1915 08/22/19 0500 08/23/19 0446 08/24/19 0548  NA 136  --   --  135 136 136  K 3.9  --   --  3.3* 3.9 3.5  CL 103  --   --  101 100 102  CO2 22  --   --  '24 23 24  ' GLUCOSE 106*  --   --  103* 104* 109*  BUN 7  --   --  <5* <5* <5*  CALCIUM 8.4*  --   --  8.5* 8.8* 8.7*  CREATININE 0.80   < > 0.84 0.68 0.67 0.63  GFRNONAA >60   < > >60 >60 >60 >60  GFRAA >60   < > >60 >60 >60 >60   < > = values in this interval not displayed.    LIVER FUNCTION TESTS: Recent Labs    08/06/19 0353 08/06/19 0353 08/13/19 1516 08/13/19 1516 08/20/19 2016 08/21/19 1626 08/23/19 0446 08/24/19 0548  BILITOT 0.8  --  1.2  --  1.2 1.0  --   --   AST 14*  --  15  --  19 13*  --   --   ALT 19  --  20  --  17 13  --   --   ALKPHOS 62  --  65  --  61 51  --   --   PROT 6.4*  --  6.3*  --  7.6 6.4*  --   --   ALBUMIN 1.9*   < > 2.3*   < > 2.1* 1.8* 1.7* 1.9*   < > = values in this interval not displayed.    Assessment and Plan: Psoas abscess s/p aspiration and drainage 08/20/18 Drain remains in place.  Output small.  WBC 8.0.  Afebrile.  Culture with no growth.  Continues Unasyn.  Plan to re-image soon if drain output remains  minimal.  Continue current management.   Electronically Signed: Docia Barrier, PA 08/24/2019, 4:14 PM   I spent a total of 15 Minutes at the the patient's bedside AND on the patient's hospital floor or unit, greater than 50% of which was counseling/coordinating care for psoas  abscess.

## 2019-08-24 NOTE — Progress Notes (Signed)
PROGRESS NOTE  [name] [id number] DOB: [birth year]   PCP: Lesleigh Noe, MD  Patient is from: home  DOA: 08/20/2019 LOS: 4  Brief Narrative / Interim history: This is a 54 year old male with recent hospital admission from 12/29-1/6 for lumbar epidural abscess, cauda equina syndrome, severe spinal stenosis status post decompressive lumbar laminectomy L3-4, 4-5 for evacuation of epidural abscess on 12/30 and CT aspiration of right psoas abscess on 12/30 found to have MSSA bacteremia started on nafcillin for 6 weeks via PICC line planned through 2/9 per ID who was in CIR and discharged 1/15 and noticed pain and drainage of buttock area, febrile in the ED and tachycardic leukocytosis.  Started on vancomycin/Zosyn, IV fluids and found to have right psoas muscle intramuscular abscess.  General surgery and IR have been consulted.  General surgery was able to debride large amount of material at bedside on 1/25. IR guided right psoas muscle abscess drainage on 1/26.  Infectious disease consulted on 1/28 for assistance with his antibiotics.  Subjective: No major events overnight of this morning.  No complaints.  Just wanted to feel better and go home.  Objective: Vitals:   08/24/19 0333 08/24/19 0855 08/24/19 1225 08/24/19 1600  BP: 122/78 122/84 122/77 110/73  Pulse: 79 86 (!) 111 86  Resp: 18     Temp: 98.4 F (36.9 C) 97.8 F (36.6 C) 98.3 F (36.8 C) 98.8 F (37.1 C)  TempSrc: Oral Oral Oral Oral  SpO2: 100% 100% 98% 92%  Weight:      Height:        Intake/Output Summary (Last 24 hours) at 08/24/2019 1641 Last data filed at 08/24/2019 1322 Gross per 24 hour  Intake 1682 ml  Output 1682.5 ml  Net -0.5 ml   Filed Weights   08/22/19 0626 08/23/19 0502 08/24/19 0300  Weight: 108.4 kg 103.5 kg 103.4 kg    Examination:  GENERAL: No acute distress.  Appears well.  Sitting on bedside chair. HEENT: MMM.  Vision and hearing grossly intact.  NECK: Supple.  No apparent JVD.    RESP:  No IWOB. Good air movement bilaterally. CVS:  RRR. Heart sounds normal.  ABD/GI/GU: Bowel sounds present. Soft. Non tender.  MSK/EXT: No apparent deformity.  No edema.  Bilateral LE weakness. SKIN: Infected sacral decubitus ulcers. NEURO: Awake, alert and oriented appropriately.  3+/5 in both LEs with hip flexion. PSYCH: Calm. Normal affect.   Procedures:  1/25-general surgery was able to debride large amount of material at bedside.  1/26-IR guided right psoas muscle abscess drainage on 1/26.  Assessment & Plan: Sepsis secondary to cellulitis, infected sacral decubitus and right psoas intramuscular abscess.  Surgical debridement and IR drainage with drain placement as above.  Sepsis physiology resolved.  MRSA PCR, blood and tissue cultures negative so far.  Was on nafcillin for MSSA bacteremia since 07/25/2019. -IR-reimage soon if drain output remains minimal. -General surgery-signed off. Recs: Antibiotics, WTD TID, air mattress, turning q2h -Vancomycin and Zosyn 1/25-1/28 -IV Unasyn 1/28>> 2/22 per ID. -Wound care following. -Continue Florastor -Pain control with bowel regimen.  MSSA bacteremia: Was on nafcillin since 07/25/2019, for a total of 6 weeks.   -Antibiotic now broadened and extended due to the above.  Acute toxic metabolic encephalopathy likely combination of medications and sepsis as above.  Resolved. -Minimize sedating medications -Treat treatable causes as above. -Reorientation and delirium precautions.  Lumbar epidural abscess/cauda equina syndrome with severe spinal stenosis at L3-L4 and L4-L5: Had decompressive lumbar laminectomies L3-4,  L4-5 with partial medial facetectomies and foraminotomies of L3-4 and 5 on 07/25/2019 by Dr. Saintclair Halsted.  Has bilateral lower extremity weakness.  Denies bowel or bladder issue now. -PT/OT-HH  Anemia of chronic disease: b/l Hgb 10-11> 10.7 (admit)>> 9.2 likely some element of hemodilution, blood draws and surgical bleed.  Anemia  panel consistent with ACD. -Continue monitoring  Hypokalemia: Resolved.  Alcohol use disorder: Should be outside withdrawal window. -Encourage cessation -Vitamins  Hyponatremia: Resolved. -Continue monitoring  Class II obesity Body mass index is 30.08 kg/m. -Encourage lifestyle change to lose weight.    Pressure injury Pressure Injury 07/31/19 Buttocks Medial Stage 2 -  Partial thickness loss of dermis presenting as a shallow open injury with a red, pink wound bed without slough. (Active)  07/31/19 1640  Location: Buttocks  Location Orientation: Medial  Staging: Stage 2 -  Partial thickness loss of dermis presenting as a shallow open injury with a red, pink wound bed without slough.  Wound Description (Comments):   Present on Admission: Yes     Pressure Injury 07/31/19 Buttocks Right;Lower Stage 2 -  Partial thickness loss of dermis presenting as a shallow open injury with a red, pink wound bed without slough. (Active)  07/31/19 1644  Location: Buttocks  Location Orientation: Right;Lower  Staging: Stage 2 -  Partial thickness loss of dermis presenting as a shallow open injury with a red, pink wound bed without slough.  Wound Description (Comments):   Present on Admission: Yes     Pressure Injury 07/31/19 Buttocks Right;Upper Stage 2 -  Partial thickness loss of dermis presenting as a shallow open injury with a red, pink wound bed without slough. (Active)  07/31/19 1646  Location: Buttocks  Location Orientation: Right;Upper  Staging: Stage 2 -  Partial thickness loss of dermis presenting as a shallow open injury with a red, pink wound bed without slough.  Wound Description (Comments):   Present on Admission: Yes     Pressure Injury 08/21/19 Medial (Active)  08/21/19 0200  Location:   Location Orientation: Medial  Staging:   Wound Description (Comments):   Present on Admission: Yes     Pressure Injury 08/21/19 Sacrum Unstageable - Full thickness tissue loss in which  the base of the injury is covered by slough (yellow, tan, gray, green or brown) and/or eschar (tan, brown or black) in the wound bed. (Active)  08/21/19 2015  Location: Sacrum  Location Orientation:   Staging: Unstageable - Full thickness tissue loss in which the base of the injury is covered by slough (yellow, tan, gray, green or brown) and/or eschar (tan, brown or black) in the wound bed.  Wound Description (Comments):   Present on Admission: Yes     Pressure Injury 08/21/19 Buttocks Right Unstageable - Full thickness tissue loss in which the base of the injury is covered by slough (yellow, tan, gray, green or brown) and/or eschar (tan, brown or black) in the wound bed. (Active)  08/21/19 2015  Location: Buttocks  Location Orientation: Right  Staging: Unstageable - Full thickness tissue loss in which the base of the injury is covered by slough (yellow, tan, gray, green or brown) and/or eschar (tan, brown or black) in the wound bed.  Wound Description (Comments):   Present on Admission: Yes     Nutrition Problem: Increased nutrient needs Etiology: wound healing  Signs/Symptoms: estimated needs  Interventions: MVI, Boost Breeze, Magic cup, Refer to RD note for recommendations   DVT prophylaxis: Subcu Lovenox Code Status: Full code Family Communication:  Patient and/or RN. Available if any question. Disposition Plan: Remains inpatient for treatment of infected sacral decubitus and psoas abscess.  Final disposition likely home with home health and IV antibiotics.  Already has PICC line. Consultants: General surgery (off), ID (off), IR    Microbiology summarized: 1/25-COVID-19 influenza PCR negative. 1/26-MRSA PCR negative. 1/25-blood cultures negative. 1/26-abscess cultures negative so far.  Sch Meds:  Scheduled Meds: . Chlorhexidine Gluconate Cloth  6 each Topical Daily  . enoxaparin (LOVENOX) injection  40 mg Subcutaneous Q24H  . feeding supplement  1 Container Oral TID BM    . folic acid  1 mg Oral Daily  . multivitamin with minerals  1 tablet Oral Daily  . sodium chloride flush  10-40 mL Intracatheter Q12H  . sodium chloride flush  5 mL Intracatheter Q8H  . tamsulosin  0.4 mg Oral QPC supper  . thiamine  100 mg Oral Daily   Or  . thiamine  100 mg Intravenous Daily   Continuous Infusions: . ampicillin-sulbactam (UNASYN) IV 3 g (08/24/19 1131)   PRN Meds:.acetaminophen **OR** acetaminophen, cyclobenzaprine, Melatonin, morphine injection, simethicone, sodium chloride flush  Antimicrobials: Anti-infectives (From admission, onward)   Start     Dose/Rate Route Frequency Ordered Stop   08/23/19 1230  Ampicillin-Sulbactam (UNASYN) 3 g in sodium chloride 0.9 % 100 mL IVPB     3 g 200 mL/hr over 30 Minutes Intravenous Every 6 hours 08/23/19 1018     08/21/19 0900  vancomycin (VANCOREADY) IVPB 1250 mg/250 mL  Status:  Discontinued     1,250 mg 166.7 mL/hr over 90 Minutes Intravenous Every 12 hours 08/20/19 2114 08/23/19 1018   08/21/19 0400  piperacillin-tazobactam (ZOSYN) IVPB 3.375 g  Status:  Discontinued     3.375 g 12.5 mL/hr over 240 Minutes Intravenous Every 8 hours 08/20/19 2314 08/23/19 1018   08/20/19 2100  vancomycin (VANCOCIN) 2,500 mg in sodium chloride 0.9 % 500 mL IVPB     2,500 mg 250 mL/hr over 120 Minutes Intravenous  Once 08/20/19 2059 08/20/19 2321   08/20/19 2045  piperacillin-tazobactam (ZOSYN) IVPB 3.375 g     3.375 g 100 mL/hr over 30 Minutes Intravenous  Once 08/20/19 2037 08/20/19 2130       I have personally reviewed the following labs and images: CBC: Recent Labs  Lab 08/20/19 2016 08/20/19 2016 08/21/19 0714 08/21/19 1915 08/22/19 0500 08/23/19 0446 08/24/19 0548  WBC 14.8*   < > 11.6* 11.3* 10.2 10.0 8.0  NEUTROABS 12.8*  --   --   --   --   --   --   HGB 10.7*   < > 9.2* 9.3* 8.8* 8.9* 9.2*  HCT 32.4*   < > 28.6* 28.5* 27.3* 27.8* 29.1*  MCV 93.4   < > 93.8 95.3 95.1 94.9 96.0  PLT 512*   < > 435* 458* 427* 458*  483*   < > = values in this interval not displayed.   BMP &GFR Recent Labs  Lab 08/21/19 0714 08/21/19 0714 08/21/19 1626 08/21/19 1915 08/22/19 0500 08/23/19 0446 08/24/19 0548  NA 135  --  136  --  135 136 136  K 3.6  --  3.9  --  3.3* 3.9 3.5  CL 101  --  103  --  101 100 102  CO2 24  --  22  --  24 23 24   GLUCOSE 110*  --  106*  --  103* 104* 109*  BUN 7  --  7  --  <  5* <5* <5*  CREATININE 0.76   < > 0.80 0.84 0.68 0.67 0.63  CALCIUM 8.5*  --  8.4*  --  8.5* 8.8* 8.7*  MG 1.8  --   --   --  1.9 1.9 2.2  PHOS 4.3  --   --   --   --  4.4 4.8*   < > = values in this interval not displayed.   Estimated Creatinine Clearance: 133.3 mL/min (by C-G formula based on SCr of 0.63 mg/dL). Liver & Pancreas: Recent Labs  Lab 08/20/19 2016 08/21/19 1626 08/23/19 0446 08/24/19 0548  AST 19 13*  --   --   ALT 17 13  --   --   ALKPHOS 61 51  --   --   BILITOT 1.2 1.0  --   --   PROT 7.6 6.4*  --   --   ALBUMIN 2.1* 1.8* 1.7* 1.9*   No results for input(s): LIPASE, AMYLASE in the last 168 hours. No results for input(s): AMMONIA in the last 168 hours. Diabetic: No results for input(s): HGBA1C in the last 72 hours. No results for input(s): GLUCAP in the last 168 hours. Cardiac Enzymes: No results for input(s): CKTOTAL, CKMB, CKMBINDEX, TROPONINI in the last 168 hours. No results for input(s): PROBNP in the last 8760 hours. Coagulation Profile: Recent Labs  Lab 08/20/19 2057  INR 1.5*   Thyroid Function Tests: No results for input(s): TSH, T4TOTAL, FREET4, T3FREE, THYROIDAB in the last 72 hours. Lipid Profile: No results for input(s): CHOL, HDL, LDLCALC, TRIG, CHOLHDL, LDLDIRECT in the last 72 hours. Anemia Panel: Recent Labs    08/23/19 0711  VITAMINB12 196  FOLATE 11.2  FERRITIN 530*  TIBC 130*  IRON 23*  RETICCTPCT 1.2   Urine analysis:    Component Value Date/Time   COLORURINE YELLOW 08/22/2019 1216   APPEARANCEUR CLEAR 08/22/2019 1216   LABSPEC 1.014  08/22/2019 1216   PHURINE 5.0 08/22/2019 1216   GLUCOSEU NEGATIVE 08/22/2019 1216   HGBUR SMALL (A) 08/22/2019 1216   BILIRUBINUR NEGATIVE 08/22/2019 1216   KETONESUR NEGATIVE 08/22/2019 1216   PROTEINUR NEGATIVE 08/22/2019 1216   UROBILINOGEN 0.2 01/09/2013 1046   NITRITE NEGATIVE 08/22/2019 1216   LEUKOCYTESUR NEGATIVE 08/22/2019 1216   Sepsis Labs: Invalid input(s): PROCALCITONIN, [place]  Microbiology: Recent Results (from the past 240 hour(s))  Blood culture (routine x 2)     Status: None (Preliminary result)   Collection Time: 08/20/19  8:15 PM   Specimen: BLOOD LEFT ARM  Result Value Ref Range Status   Specimen Description BLOOD LEFT ARM  Final   Special Requests   Final    BOTTLES DRAWN AEROBIC AND ANAEROBIC Blood Culture adequate volume   Culture   Final    NO GROWTH 4 DAYS Performed at [facility], [address], [place], [place] [address]    Report Status PENDING  Incomplete  Blood culture (routine x 2)     Status: None (Preliminary result)   Collection Time: 08/20/19  8:30 PM   Specimen: BLOOD LEFT ARM  Result Value Ref Range Status   Specimen Description BLOOD LEFT ARM  Final   Special Requests   Final    BOTTLES DRAWN AEROBIC ONLY Blood Culture results may not be optimal due to an excessive volume of blood received in culture bottles   Culture   Final    NO GROWTH 4 DAYS Performed at [facility], [place] [address], [place], [place] [address]  Report Status PENDING  Incomplete  Respiratory Panel by RT PCR (Flu A&B, Covid) - Nasopharyngeal Swab     Status: None   Collection Time: 08/20/19 10:51 PM   Specimen: Nasopharyngeal Swab  Result Value Ref Range Status   SARS Coronavirus 2 by RT PCR NEGATIVE NEGATIVE Final    Comment: (NOTE) SARS-CoV-2 target nucleic acids are NOT DETECTED. The SARS-CoV-2 RNA is generally detectable in upper respiratoy specimens during the acute phase of infection. The lowest concentration of SARS-CoV-2 viral  copies this assay can detect is 131 copies/mL. A negative result does not preclude SARS-Cov-2 infection and should not be used as the sole basis for treatment or other patient management decisions. A negative result may occur with  improper specimen collection/handling, submission of specimen other than nasopharyngeal swab, presence of viral mutation(s) within the areas targeted by this assay, and inadequate number of viral copies (<131 copies/mL). A negative result must be combined with clinical observations, patient history, and epidemiological information. The expected result is Negative. Fact Sheet for Patients:  PinkCheek.be Fact Sheet for Healthcare Providers:  GravelBags.it This test is not yet ap proved or cleared by the [place] FDA and  has been authorized for detection and/or diagnosis of SARS-CoV-2 by FDA under an Emergency Use Authorization (EUA). This EUA will remain  in effect (meaning this test can be used) for the duration of the COVID-19 declaration under Section 564(b)(1) of the Act, 21 U.S.C. section 360bbb-3(b)(1), unless the authorization is terminated or revoked sooner.    Influenza A by PCR NEGATIVE NEGATIVE Final   Influenza B by PCR NEGATIVE NEGATIVE Final    Comment: (NOTE) The Xpert Xpress SARS-CoV-2/FLU/RSV assay is intended as an aid in  the diagnosis of influenza from Nasopharyngeal swab specimens and  should not be used as a sole basis for treatment. Nasal washings and  aspirates are unacceptable for Xpert Xpress SARS-CoV-2/FLU/RSV  testing. Fact Sheet for Patients: PinkCheek.be Fact Sheet for Healthcare Providers: GravelBags.it This test is not yet approved or cleared by the [place] FDA and  has been authorized for detection and/or diagnosis of SARS-CoV-2 by  FDA under an Emergency Use Authorization (EUA). This EUA will  remain  in effect (meaning this test can be used) for the duration of the  Covid-19 declaration under Section 564(b)(1) of the Act, 21  U.S.C. section 360bbb-3(b)(1), unless the authorization is  terminated or revoked. Performed at [facility], [place] [address] [place], [place] [address]   MRSA PCR Screening     Status: None   Collection Time: 08/21/19  5:20 AM   Specimen: Nasopharyngeal  Result Value Ref Range Status   MRSA by PCR NEGATIVE NEGATIVE Final    Comment:        The GeneXpert MRSA Assay (FDA approved for NASAL specimens only), is one component of a comprehensive MRSA colonization surveillance program. It is not intended to diagnose MRSA infection nor to guide or monitor treatment for MRSA infections. Performed at [facility], [place] [address] [place], [place] [address]   Aerobic/Anaerobic Culture (surgical/deep wound)     Status: None (Preliminary result)   Collection Time: 08/21/19 11:22 AM   Specimen: Abscess  Result Value Ref Range Status   Specimen Description ABSCESS  Final   Special Requests Normal  Final   Gram Stain   Final    MODERATE WBC PRESENT, PREDOMINANTLY MONONUCLEAR NO ORGANISMS SEEN    Culture   Final    NO GROWTH 3 DAYS  NO ANAEROBES ISOLATED; CULTURE IN PROGRESS FOR 5 DAYS Performed at [facility], [place] [address], [place], [place] [address]    Report Status PENDING  Incomplete  Urine culture     Status: None   Collection Time: 08/22/19 12:53 PM   Specimen: In/Out Cath Urine  Result Value Ref Range Status   Specimen Description IN/OUT CATH URINE  Final   Special Requests NONE  Final   Culture   Final    NO GROWTH Performed at [facility], [place] [address], [place], [place] [address]    Report Status 08/23/2019 FINAL  Final    Radiology Studies: No results found.   Wynne Rozak T. [place]  If 7PM-7AM, please contact night-coverage [email or web address] Password [facility] 08/24/2019, 4:41 PM

## 2019-08-24 NOTE — Progress Notes (Signed)
Occupational Therapy Treatment Patient Details Name: [name] MRN: [id number] DOB: [birth year] Today's Date: 08/24/2019    History of present illness [name] is a 54 y.o. male presenting with complaints of drainage from right buttock wound. Patient had a recent hospital admission 12/29-1/6 for lumbar epidural abscess, cauda equina syndrome, severe spinal stenosis L3-4 L4-5 status post decompressive lumbar laminectomy 3-4,4-5 for evacuation of epidural abscess on 12/30 by Dr. Saintclair Halsted and CT aspiration of right psoas abscess per interventional radiology on 12/30.  Found to have MSSA bacteremia. Pt went to CIR and then home on 1/15, returns with drainage and pain in buttocks region.    OT comments  Pt making progress with functional goals. Pt with good log roll technique to sit EOB, able to recall back precautions. Session focused on LB selfcare with review of A/E use. Pt ambulated to door, bathroom and sink with RW. OT will continue to follow acutely  Follow Up Recommendations  Home health OT;Supervision/Assistance - 24 hour    Equipment Recommendations  None recommended by OT    Recommendations for Other Services      Precautions / Restrictions Precautions Precautions: Fall;Back Precaution Comments: JP drain Restrictions Weight Bearing Restrictions: No Other Position/Activity Restrictions: JP drain       Mobility Bed Mobility Overal bed mobility: Needs Assistance Bed Mobility: Sit to Sidelying;Rolling Rolling: Supervision Sidelying to sit: Min guard       General bed mobility comments: used rails, increased time and effort to sit EOB  Transfers Overall transfer level: Needs assistance Equipment used: Rolling walker (2 wheeled) Transfers: Sit to/from Stand Sit to Stand: Min guard         General transfer comment: pt ambulated with RW to doorway, bathroom and to sink    Balance Overall balance assessment: Needs assistance Sitting-balance support: Single  extremity supported;Feet supported Sitting balance-Leahy Scale: Good     Standing balance support: Bilateral upper extremity supported;During functional activity Standing balance-Leahy Scale: Poor                             ADL either performed or assessed with clinical judgement   ADL Overall ADL's : Needs assistance/impaired     Grooming: Wash/dry hands;Wash/dry face;Min Dispensing optician: Min guard;Ambulation;RW           Functional mobility during ADLs: Min guard;Rolling walker General ADL Comments: reviewed use of ADL A/E for LB selfcare     Vision Baseline Vision/History: Wears glasses Patient Visual Report: No change from baseline     Perception     Praxis      Cognition Arousal/Alertness: Awake/alert Behavior During Therapy: WFL for tasks assessed/performed Overall Cognitive Status: Within Functional Limits for tasks assessed Area of Impairment: Safety/judgement                                        Exercises     Shoulder Instructions       General Comments      Pertinent Vitals/ Pain       Pain Assessment: 0-10 Pain Score: 6  Pain Location: buttocks Pain Descriptors / Indicators: Grimacing;Guarding Pain Intervention(s): Limited activity within patient's tolerance;Monitored during session;Repositioned  Home Living Family/patient expects to be discharged to:: Private residence  Prior Functioning/Environment              Frequency  Min 2X/week        Progress Toward Goals  OT Goals(current goals can now be found in the care plan section)  Progress towards OT goals: Progressing toward goals  Acute Rehab OT Goals Patient Stated Goal: go home and go back to work  Plan Discharge plan remains appropriate    Co-evaluation                 AM-PAC OT "6 Clicks" Daily Activity     Outcome Measure   Help  from another person eating meals?: None Help from another person taking care of personal grooming?: A Little Help from another person toileting, which includes using toliet, bedpan, or urinal?: A Little Help from another person bathing (including washing, rinsing, drying)?: A Little Help from another person to put on and taking off regular upper body clothing?: None Help from another person to put on and taking off regular lower body clothing?: A Little 6 Click Score: 20    End of Session Equipment Utilized During Treatment: Gait belt;Rolling walker  OT Visit Diagnosis: Unsteadiness on feet (R26.81);Muscle weakness (generalized) (M62.81);Pain Pain - part of body: (back)   Activity Tolerance Patient tolerated treatment well   Patient Left in chair;with call bell/phone within reach   Nurse Communication          Time: [id number] OT Time Calculation (min): 25 min  Charges: OT General Charges $OT Visit: 1 Visit OT Treatments $Self Care/Home Management : 8-22 mins $Therapeutic Activity: 8-22 mins     Britt Bottom 08/24/2019, 1:14 PM

## 2019-08-24 NOTE — Progress Notes (Signed)
[facility]   Reason for visit: Follow up on psoas abscess, decubitus ulcers.  Interval History: no new positive cultures.  Afebrile, WBC wnl.   Day 2 ampicillin/sulbactam  No associated diarrhea, rash  Physical Exam: Constitutional:  Vitals:   08/24/19 0333 08/24/19 0855  BP: 122/78 122/84  Pulse: 79 86  Resp: 18   Temp: 98.4 F (36.9 C) 97.8 F (36.6 C)  SpO2: 100% 100%   patient appears in NAD Eyes: anicteric Respiratory: Normal respiratory effort; CTA B Cardiovascular: RRR GI: soft, nt, nd  Review of Systems: Constitutional: negative for fevers, chills and anorexia Gastrointestinal: negative for nausea and diarrhea  Lab Results  Component Value Date   WBC 8.0 08/24/2019   HGB 9.2 (L) 08/24/2019   HCT 29.1 (L) 08/24/2019   MCV 96.0 08/24/2019   PLT 483 (H) 08/24/2019    Lab Results  Component Value Date   CREATININE 0.63 08/24/2019   BUN <5 (L) 08/24/2019   NA 136 08/24/2019   K 3.5 08/24/2019   CL 102 08/24/2019   CO2 24 08/24/2019    Lab Results  Component Value Date   ALT 13 08/21/2019   AST 13 (L) 08/21/2019   ALKPHOS 51 08/21/2019     Microbiology: Recent Results (from the past 240 hour(s))  Blood culture (routine x 2)     Status: None (Preliminary result)   Collection Time: 08/20/19  8:15 PM   Specimen: BLOOD LEFT ARM  Result Value Ref Range Status   Specimen Description BLOOD LEFT ARM  Final   Special Requests   Final    BOTTLES DRAWN AEROBIC AND ANAEROBIC Blood Culture adequate volume   Culture   Final    NO GROWTH 4 DAYS Performed at [facility], [address], [place], [place] [address]    Report Status PENDING  Incomplete  Blood culture (routine x 2)     Status: None (Preliminary result)   Collection Time: 08/20/19  8:30 PM   Specimen: BLOOD LEFT ARM  Result Value Ref Range Status   Specimen Description BLOOD LEFT ARM  Final   Special Requests   Final    BOTTLES DRAWN AEROBIC ONLY Blood  Culture results may not be optimal due to an excessive volume of blood received in culture bottles   Culture   Final    NO GROWTH 4 DAYS Performed at [facility], [place] [address] [place], [place] [address]    Report Status PENDING  Incomplete  Respiratory Panel by RT PCR (Flu A&B, Covid) - Nasopharyngeal Swab     Status: None   Collection Time: 08/20/19 10:51 PM   Specimen: Nasopharyngeal Swab  Result Value Ref Range Status   SARS Coronavirus 2 by RT PCR NEGATIVE NEGATIVE Final    Comment: (NOTE) SARS-CoV-2 target nucleic acids are NOT DETECTED. The SARS-CoV-2 RNA is generally detectable in upper respiratoy specimens during the acute phase of infection. The lowest concentration of SARS-CoV-2 viral copies this assay can detect is 131 copies/mL. A negative result does not preclude SARS-Cov-2 infection and should not be used as the sole basis for treatment or other patient management decisions. A negative result may occur with  improper specimen collection/handling, submission of specimen other than nasopharyngeal swab, presence of viral mutation(s) within the areas targeted by this assay, and inadequate number of viral copies (<131 copies/mL). A negative result must be combined with clinical observations, patient history, and epidemiological information. The expected result is  Negative. Fact Sheet for Patients:  PinkCheek.be Fact Sheet for Healthcare Providers:  GravelBags.it This test is not yet ap proved or cleared by the [place] FDA and  has been authorized for detection and/or diagnosis of SARS-CoV-2 by FDA under an Emergency Use Authorization (EUA). This EUA will remain  in effect (meaning this test can be used) for the duration of the COVID-19 declaration under Section 564(b)(1) of the Act, 21 U.S.C. section 360bbb-3(b)(1), unless the authorization is terminated or revoked sooner.    Influenza A by PCR  NEGATIVE NEGATIVE Final   Influenza B by PCR NEGATIVE NEGATIVE Final    Comment: (NOTE) The Xpert Xpress SARS-CoV-2/FLU/RSV assay is intended as an aid in  the diagnosis of influenza from Nasopharyngeal swab specimens and  should not be used as a sole basis for treatment. Nasal washings and  aspirates are unacceptable for Xpert Xpress SARS-CoV-2/FLU/RSV  testing. Fact Sheet for Patients: PinkCheek.be Fact Sheet for Healthcare Providers: GravelBags.it This test is not yet approved or cleared by the [place] FDA and  has been authorized for detection and/or diagnosis of SARS-CoV-2 by  FDA under an Emergency Use Authorization (EUA). This EUA will remain  in effect (meaning this test can be used) for the duration of the  Covid-19 declaration under Section 564(b)(1) of the Act, 21  U.S.C. section 360bbb-3(b)(1), unless the authorization is  terminated or revoked. Performed at [facility], [place] [address] [place], [place] [address]   MRSA PCR Screening     Status: None   Collection Time: 08/21/19  5:20 AM   Specimen: Nasopharyngeal  Result Value Ref Range Status   MRSA by PCR NEGATIVE NEGATIVE Final    Comment:        The GeneXpert MRSA Assay (FDA approved for NASAL specimens only), is one component of a comprehensive MRSA colonization surveillance program. It is not intended to diagnose MRSA infection nor to guide or monitor treatment for MRSA infections. Performed at [facility], [place] [address], [place], [place] [address]   Aerobic/Anaerobic Culture (surgical/deep wound)     Status: None (Preliminary result)   Collection Time: 08/21/19 11:22 AM   Specimen: Abscess  Result Value Ref Range Status   Specimen Description ABSCESS  Final   Special Requests Normal  Final   Gram Stain   Final    MODERATE WBC PRESENT, PREDOMINANTLY MONONUCLEAR NO ORGANISMS SEEN    Culture   Final    NO GROWTH 2 DAYS NO  ANAEROBES ISOLATED; CULTURE IN PROGRESS FOR 5 DAYS Performed at [facility], [place] [address] [place], [place] [address]    Report Status PENDING  Incomplete  Urine culture     Status: None   Collection Time: 08/22/19 12:53 PM   Specimen: In/Out Cath Urine  Result Value Ref Range Status   Specimen Description IN/OUT CATH URINE  Final   Special Requests NONE  Final   Culture   Final    NO GROWTH Performed at [facility], [place] [address] [place], [place] [address]    Report Status 08/23/2019 FINAL  Final    Impression/Plan:  1. Psoas abscess - no new growth, likely same MSSA.  With the new abscess requiring drainage, I will extend his antibiotics 4 weeks with Unasyn through February 22nd.    2.  Infected decubitus ulcers - on antibiotics as above.  3.  Medication monitoring - creat wnl.  Will continue with weekly labs as outpatient.  I will sign off, call with questions.

## 2019-08-25 LAB — CULTURE, BLOOD (ROUTINE X 2)
Culture: NO GROWTH
Culture: NO GROWTH
Special Requests: ADEQUATE

## 2019-08-25 LAB — RENAL FUNCTION PANEL
Albumin: 1.9 g/dL — ABNORMAL LOW (ref 3.5–5.0)
Anion gap: 11 (ref 5–15)
BUN: 6 mg/dL (ref 6–20)
CO2: 23 mmol/L (ref 22–32)
Calcium: 8.7 mg/dL — ABNORMAL LOW (ref 8.9–10.3)
Chloride: 103 mmol/L (ref 98–111)
Creatinine, Ser: 0.64 mg/dL (ref 0.61–1.24)
GFR calc Af Amer: >60 mL/min (ref >60)
GFR calc non Af Amer: >60 mL/min (ref >60)
Glucose, Bld: 107 mg/dL — ABNORMAL HIGH (ref 70–99)
Phosphorus: 4.3 mg/dL (ref 2.5–4.6)
Potassium: 3.5 mmol/L (ref 3.5–5.1)
Sodium: 137 mmol/L (ref 135–145)

## 2019-08-25 LAB — HEPATIC FUNCTION PANEL
ALT: 32 U/L (ref 0–44)
AST: 25 U/L (ref 15–41)
Albumin: 2 g/dL — ABNORMAL LOW (ref 3.5–5.0)
Alkaline Phosphatase: 54 U/L (ref 38–126)
Bilirubin, Direct: <0.1 mg/dL (ref 0.0–0.2)
Total Bilirubin: 0.7 mg/dL (ref 0.3–1.2)
Total Protein: 6.8 g/dL (ref 6.5–8.1)

## 2019-08-25 LAB — CBC
HCT: 29.3 % — ABNORMAL LOW (ref 39.0–52.0)
Hemoglobin: 9.2 g/dL — ABNORMAL LOW (ref 13.0–17.0)
MCH: 30.3 pg (ref 26.0–34.0)
MCHC: 31.4 g/dL (ref 30.0–36.0)
MCV: 96.4 fL (ref 80.0–100.0)
Platelets: 509 10*3/uL — ABNORMAL HIGH (ref 150–400)
RBC: 3.04 MIL/uL — ABNORMAL LOW (ref 4.22–5.81)
RDW: 14 % (ref 11.5–15.5)
WBC: 9.6 10*3/uL (ref 4.0–10.5)
nRBC: 0 % (ref 0.0–0.2)

## 2019-08-25 LAB — MAGNESIUM: Magnesium: 2 mg/dL (ref 1.7–2.4)

## 2019-08-25 NOTE — Plan of Care (Signed)
  Problem: Education: Goal: Knowledge of General Education information will improve Description Including pain rating scale, medication(s)/side effects and non-pharmacologic comfort measures Outcome: Progressing   

## 2019-08-25 NOTE — Progress Notes (Signed)
PROGRESS NOTE  [name] [id number] DOB: 01/14/1966   PCP: Lesleigh Noe, MD  Patient is from: home  DOA: 08/20/2019 LOS: 5  Brief Narrative / Interim history: This is a 54 year old male with recent hospital admission from 12/29-1/6 for lumbar epidural abscess, cauda equina syndrome, severe spinal stenosis status post decompressive lumbar laminectomy L3-4, 4-5 for evacuation of epidural abscess on 12/30 and CT aspiration of right psoas abscess on 12/30 found to have MSSA bacteremia started on nafcillin for 6 weeks via PICC line planned through 2/9 per ID who was in CIR and discharged 1/15 and noticed pain and drainage of buttock area, brought to ED  In ED, septic with fever, tachycardia leukocytosis.  Started on vancomycin/Zosyn, IV fluids and found to have right psoas muscle intramuscular abscess.  General surgery and IR consulted.  General surgery was able to debride large amount of material at bedside on 1/25.  IR guided right psoas muscle abscess drainage on 1/26. Infectious disease consulted on 1/28 and recommended IV Unasyn through 09/17/2019, and signed off.  Per general surgery, no further debridement or surgical intervention required.  Recommended antibiotics, WTD TID, air mattress and turning q2h, and signed off.  IR following for drain, and planning repeat CT soon if drain remains minimal.  Subjective: No major events overnight of this morning.  No complaint this morning.  Pain fairly controlled.  Objective: Vitals:   08/24/19 1825 08/24/19 2029 08/25/19 0101 08/25/19 0415  BP: 125/70 120/72  117/82  Pulse:  89  81  Resp:  18  18  Temp:  98.8 F (37.1 C)  98.3 F (36.8 C)  TempSrc:  Oral  Oral  SpO2:  100%  99%  Weight:   102.1 kg   Height:        Intake/Output Summary (Last 24 hours) at 08/25/2019 1103 Last data filed at 08/25/2019 1037 Gross per 24 hour  Intake 1225 ml  Output 1120 ml  Net 105 ml   Filed Weights   08/23/19 0502 08/24/19 0300 08/25/19  0101  Weight: 103.5 kg 103.4 kg 102.1 kg    Examination:  GENERAL: No acute distress.  Appears well.  HEENT: MMM.  Vision and hearing grossly intact.  NECK: Supple.  No apparent JVD.  RESP:  No IWOB. Good air movement bilaterally. CVS:  RRR. Heart sounds normal.  ABD/GI/GU: Bowel sounds present. Soft. Non tender.  MSK/EXT: Bilateral LE weakness.  No apparent deformity.  No edema. SKIN: Sacral decubitus ulcers.  JP drain to right back with minimal output. NEURO: Awake, alert and oriented appropriately.  3+/5 in both LEs with hip flexion. PSYCH: Calm. Normal affect.  Procedures:  1/25-general surgery was able to debride large amount of material at bedside.  1/26-IR guided right psoas muscle abscess drainage on 1/26.  Assessment & Plan: Sepsis secondary to cellulitis, infected sacral decubitus and right psoas intramuscular abscess.  Surgical debridement and IR drainage with drain placement as above.  Sepsis physiology resolved.  MRSA PCR, blood and tissue cultures negative so far.  Was on nafcillin for MSSA bacteremia since 07/25/2019. -IR-reimage soon if drain output remains minimal. -General surgery-signed off. Recs: Antibiotics, WTD TID, air mattress, turning q2h -Vancomycin and Zosyn 1/25-1/28 -IV Unasyn 1/28>> 2/22 per ID. -Wound care following. -Continue Florastor -Pain control with bowel regimen.  MSSA bacteremia: was on nafcillin since 07/25/2019, for a total of 6 weeks.   -Antibiotic now broadened and extended due to the above.  Acute toxic metabolic encephalopathy likely combination of medications  and sepsis as above.  Resolved. -Minimize sedating medications -Treat treatable causes as above. -Reorientation and delirium precautions.  Lumbar epidural abscess/cauda equina syndrome with severe spinal stenosis at L3-L4 and L4-L5: Had decompressive lumbar laminectomies L3-4, L4-5 with partial medial facetectomies and foraminotomies of L3-4 and 5 on 07/25/2019 by Dr. Saintclair Halsted.   Has bilateral lower extremity weakness.  Denies bowel or bladder issue now. -PT/OT-HH  Anemia of chronic disease: b/l Hgb 10-11> 10.7 (admit)>> 9.2, likely some element of hemodilution, blood draws and surgical bleed.  Anemia panel consistent with ACD. -Continue monitoring  Hypokalemia: Resolved.  Alcohol use disorder: Should be outside withdrawal window. -Encourage cessation -Vitamins  Hyponatremia: Resolved. -Continue monitoring  Class II obesity Body mass index is 29.69 kg/m. -Encourage lifestyle change to lose weight.    Pressure injury Pressure Injury 07/31/19 Buttocks Medial Stage 2 -  Partial thickness loss of dermis presenting as a shallow open injury with a red, pink wound bed without slough. (Active)  07/31/19 1640  Location: Buttocks  Location Orientation: Medial  Staging: Stage 2 -  Partial thickness loss of dermis presenting as a shallow open injury with a red, pink wound bed without slough.  Wound Description (Comments):   Present on Admission: Yes     Pressure Injury 07/31/19 Buttocks Right;Lower Stage 2 -  Partial thickness loss of dermis presenting as a shallow open injury with a red, pink wound bed without slough. (Active)  07/31/19 1644  Location: Buttocks  Location Orientation: Right;Lower  Staging: Stage 2 -  Partial thickness loss of dermis presenting as a shallow open injury with a red, pink wound bed without slough.  Wound Description (Comments):   Present on Admission: Yes     Pressure Injury 07/31/19 Buttocks Right;Upper Stage 2 -  Partial thickness loss of dermis presenting as a shallow open injury with a red, pink wound bed without slough. (Active)  07/31/19 1646  Location: Buttocks  Location Orientation: Right;Upper  Staging: Stage 2 -  Partial thickness loss of dermis presenting as a shallow open injury with a red, pink wound bed without slough.  Wound Description (Comments):   Present on Admission: Yes     Pressure Injury 08/21/19 Medial  (Active)  08/21/19 0200  Location:   Location Orientation: Medial  Staging:   Wound Description (Comments):   Present on Admission: Yes     Pressure Injury 08/21/19 Sacrum Unstageable - Full thickness tissue loss in which the base of the injury is covered by slough (yellow, tan, gray, green or brown) and/or eschar (tan, brown or black) in the wound bed. (Active)  08/21/19 2015  Location: Sacrum  Location Orientation:   Staging: Unstageable - Full thickness tissue loss in which the base of the injury is covered by slough (yellow, tan, gray, green or brown) and/or eschar (tan, brown or black) in the wound bed.  Wound Description (Comments):   Present on Admission: Yes     Pressure Injury 08/21/19 Buttocks Right Unstageable - Full thickness tissue loss in which the base of the injury is covered by slough (yellow, tan, gray, green or brown) and/or eschar (tan, brown or black) in the wound bed. (Active)  08/21/19 2015  Location: Buttocks  Location Orientation: Right  Staging: Unstageable - Full thickness tissue loss in which the base of the injury is covered by slough (yellow, tan, gray, green or brown) and/or eschar (tan, brown or black) in the wound bed.  Wound Description (Comments):   Present on Admission: Yes  Nutrition Problem: Increased nutrient needs Etiology: wound healing  Signs/Symptoms: estimated needs  Interventions: MVI, Boost Breeze, Magic cup, Refer to RD note for recommendations   DVT prophylaxis: Subcu Lovenox Code Status: Full code Family Communication: Patient and/or RN. Available if any question. Disposition Plan: Remains inpatient for treatment of infected sacral decubitus and psoas abscess.  Final disposition likely home with home health and IV antibiotics once cleared by IR.  Already has PICC line. Consultants: General surgery (off), ID (off), IR    Microbiology summarized: 1/25-COVID-19 influenza PCR negative. 1/26-MRSA PCR negative. 1/25-blood  cultures negative. 1/26-abscess cultures negative so far.  Sch Meds:  Scheduled Meds: . Chlorhexidine Gluconate Cloth  6 each Topical Daily  . enoxaparin (LOVENOX) injection  40 mg Subcutaneous Q24H  . feeding supplement  1 Container Oral TID BM  . folic acid  1 mg Oral Daily  . multivitamin with minerals  1 tablet Oral Daily  . sodium chloride flush  10-40 mL Intracatheter Q12H  . sodium chloride flush  5 mL Intracatheter Q8H  . tamsulosin  0.4 mg Oral QPC supper  . thiamine  100 mg Oral Daily   Or  . thiamine  100 mg Intravenous Daily   Continuous Infusions: . ampicillin-sulbactam (UNASYN) IV 3 g (08/25/19 0518)   PRN Meds:.acetaminophen **OR** acetaminophen, cyclobenzaprine, Melatonin, morphine injection, simethicone, sodium chloride flush  Antimicrobials: Anti-infectives (From admission, onward)   Start     Dose/Rate Route Frequency Ordered Stop   08/23/19 1230  Ampicillin-Sulbactam (UNASYN) 3 g in sodium chloride 0.9 % 100 mL IVPB     3 g 200 mL/hr over 30 Minutes Intravenous Every 6 hours 08/23/19 1018     08/21/19 0900  vancomycin (VANCOREADY) IVPB 1250 mg/250 mL  Status:  Discontinued     1,250 mg 166.7 mL/hr over 90 Minutes Intravenous Every 12 hours 08/20/19 2114 08/23/19 1018   08/21/19 0400  piperacillin-tazobactam (ZOSYN) IVPB 3.375 g  Status:  Discontinued     3.375 g 12.5 mL/hr over 240 Minutes Intravenous Every 8 hours 08/20/19 2314 08/23/19 1018   08/20/19 2100  vancomycin (VANCOCIN) 2,500 mg in sodium chloride 0.9 % 500 mL IVPB     2,500 mg 250 mL/hr over 120 Minutes Intravenous  Once 08/20/19 2059 08/20/19 2321   08/20/19 2045  piperacillin-tazobactam (ZOSYN) IVPB 3.375 g     3.375 g 100 mL/hr over 30 Minutes Intravenous  Once 08/20/19 2037 08/20/19 2130       I have personally reviewed the following labs and images: CBC: Recent Labs  Lab 08/20/19 2016 08/21/19 0714 08/21/19 1915 08/22/19 0500 08/23/19 0446 08/24/19 0548 08/25/19 0303  WBC  14.8*   < > 11.3* 10.2 10.0 8.0 9.6  NEUTROABS 12.8*  --   --   --   --   --   --   HGB 10.7*   < > 9.3* 8.8* 8.9* 9.2* 9.2*  HCT 32.4*   < > 28.5* 27.3* 27.8* 29.1* 29.3*  MCV 93.4   < > 95.3 95.1 94.9 96.0 96.4  PLT 512*   < > 458* 427* 458* 483* 509*   < > = values in this interval not displayed.   BMP &GFR Recent Labs  Lab 08/21/19 0714 08/21/19 0714 08/21/19 1626 08/21/19 1626 08/21/19 1915 08/22/19 0500 08/23/19 0446 08/24/19 0548 08/25/19 0303  NA 135   < > 136  --   --  135 136 136 137  K 3.6   < > 3.9  --   --  3.3* 3.9 3.5 3.5  CL 101   < > 103  --   --  101 100 102 103  CO2 24   < > 22  --   --  24 23 24 23   GLUCOSE 110*   < > 106*  --   --  103* 104* 109* 107*  BUN 7   < > 7  --   --  <5* <5* <5* 6  CREATININE 0.76   < > 0.80   < > 0.84 0.68 0.67 0.63 0.64  CALCIUM 8.5*   < > 8.4*  --   --  8.5* 8.8* 8.7* 8.7*  MG 1.8  --   --   --   --  1.9 1.9 2.2 2.0  PHOS 4.3  --   --   --   --   --  4.4 4.8* 4.3   < > = values in this interval not displayed.   Estimated Creatinine Clearance: 132.6 mL/min (by C-G formula based on SCr of 0.64 mg/dL). Liver & Pancreas: Recent Labs  Lab 08/20/19 2016 08/21/19 1626 08/23/19 0446 08/24/19 0548 08/25/19 0303  AST 19 13*  --   --  25  ALT 17 13  --   --  32  ALKPHOS 61 51  --   --  54  BILITOT 1.2 1.0  --   --  0.7  PROT 7.6 6.4*  --   --  6.8  ALBUMIN 2.1* 1.8* 1.7* 1.9* 2.0*  1.9*   No results for input(s): LIPASE, AMYLASE in the last 168 hours. No results for input(s): AMMONIA in the last 168 hours. Diabetic: No results for input(s): HGBA1C in the last 72 hours. No results for input(s): GLUCAP in the last 168 hours. Cardiac Enzymes: No results for input(s): CKTOTAL, CKMB, CKMBINDEX, TROPONINI in the last 168 hours. No results for input(s): PROBNP in the last 8760 hours. Coagulation Profile: Recent Labs  Lab 08/20/19 2057  INR 1.5*   Thyroid Function Tests: No results for input(s): TSH, T4TOTAL, FREET4,  T3FREE, THYROIDAB in the last 72 hours. Lipid Profile: No results for input(s): CHOL, HDL, LDLCALC, TRIG, CHOLHDL, LDLDIRECT in the last 72 hours. Anemia Panel: Recent Labs    08/23/19 0711  VITAMINB12 196  FOLATE 11.2  FERRITIN 530*  TIBC 130*  IRON 23*  RETICCTPCT 1.2   Urine analysis:    Component Value Date/Time   COLORURINE YELLOW 08/22/2019 1216   APPEARANCEUR CLEAR 08/22/2019 1216   LABSPEC 1.014 08/22/2019 1216   PHURINE 5.0 08/22/2019 1216   GLUCOSEU NEGATIVE 08/22/2019 1216   HGBUR SMALL (A) 08/22/2019 1216   BILIRUBINUR NEGATIVE 08/22/2019 1216   KETONESUR NEGATIVE 08/22/2019 1216   PROTEINUR NEGATIVE 08/22/2019 1216   UROBILINOGEN 0.2 01/09/2013 1046   NITRITE NEGATIVE 08/22/2019 1216   LEUKOCYTESUR NEGATIVE 08/22/2019 1216   Sepsis Labs: Invalid input(s): PROCALCITONIN, [place]  Microbiology: Recent Results (from the past 240 hour(s))  Blood culture (routine x 2)     Status: None (Preliminary result)   Collection Time: 08/20/19  8:15 PM   Specimen: BLOOD LEFT ARM  Result Value Ref Range Status   Specimen Description BLOOD LEFT ARM  Final   Special Requests   Final    BOTTLES DRAWN AEROBIC AND ANAEROBIC Blood Culture adequate volume   Culture   Final    NO GROWTH 4 DAYS Performed at [facility], [address], [place], [place] [address]    Report Status PENDING  Incomplete  Blood culture (routine x 2)     Status: None (Preliminary result)   Collection Time: 08/20/19  8:30 PM   Specimen: BLOOD LEFT ARM  Result Value Ref Range Status   Specimen Description BLOOD LEFT ARM  Final   Special Requests   Final    BOTTLES DRAWN AEROBIC ONLY Blood Culture results may not be optimal due to an excessive volume of blood received in culture bottles   Culture   Final    NO GROWTH 4 DAYS Performed at [facility], [place] [address] [place], [place] [address]    Report Status PENDING  Incomplete  Respiratory Panel by RT PCR (Flu A&B, Covid) -  Nasopharyngeal Swab     Status: None   Collection Time: 08/20/19 10:51 PM   Specimen: Nasopharyngeal Swab  Result Value Ref Range Status   SARS Coronavirus 2 by RT PCR NEGATIVE NEGATIVE Final    Comment: (NOTE) SARS-CoV-2 target nucleic acids are NOT DETECTED. The SARS-CoV-2 RNA is generally detectable in upper respiratoy specimens during the acute phase of infection. The lowest concentration of SARS-CoV-2 viral copies this assay can detect is 131 copies/mL. A negative result does not preclude SARS-Cov-2 infection and should not be used as the sole basis for treatment or other patient management decisions. A negative result may occur with  improper specimen collection/handling, submission of specimen other than nasopharyngeal swab, presence of viral mutation(s) within the areas targeted by this assay, and inadequate number of viral copies (<131 copies/mL). A negative result must be combined with clinical observations, patient history, and epidemiological information. The expected result is Negative. Fact Sheet for Patients:  PinkCheek.be Fact Sheet for Healthcare Providers:  GravelBags.it This test is not yet ap proved or cleared by the [place] FDA and  has been authorized for detection and/or diagnosis of SARS-CoV-2 by FDA under an Emergency Use Authorization (EUA). This EUA will remain  in effect (meaning this test can be used) for the duration of the COVID-19 declaration under Section 564(b)(1) of the Act, 21 U.S.C. section 360bbb-3(b)(1), unless the authorization is terminated or revoked sooner.    Influenza A by PCR NEGATIVE NEGATIVE Final   Influenza B by PCR NEGATIVE NEGATIVE Final    Comment: (NOTE) The Xpert Xpress SARS-CoV-2/FLU/RSV assay is intended as an aid in  the diagnosis of influenza from Nasopharyngeal swab specimens and  should not be used as a sole basis for treatment. Nasal washings and  aspirates  are unacceptable for Xpert Xpress SARS-CoV-2/FLU/RSV  testing. Fact Sheet for Patients: PinkCheek.be Fact Sheet for Healthcare Providers: GravelBags.it This test is not yet approved or cleared by the [place] FDA and  has been authorized for detection and/or diagnosis of SARS-CoV-2 by  FDA under an Emergency Use Authorization (EUA). This EUA will remain  in effect (meaning this test can be used) for the duration of the  Covid-19 declaration under Section 564(b)(1) of the Act, 21  U.S.C. section 360bbb-3(b)(1), unless the authorization is  terminated or revoked. Performed at [facility], [place] [address], [place], [place] [address]   MRSA PCR Screening     Status: None   Collection Time: 08/21/19  5:20 AM   Specimen: Nasopharyngeal  Result Value Ref Range Status   MRSA by PCR NEGATIVE NEGATIVE Final    Comment:        The GeneXpert MRSA Assay (FDA approved for NASAL specimens only), is one component of a comprehensive MRSA colonization surveillance program. It is not intended  to diagnose MRSA infection nor to guide or monitor treatment for MRSA infections. Performed at [facility], [place] [address], [place], [place] [address]   Aerobic/Anaerobic Culture (surgical/deep wound)     Status: None (Preliminary result)   Collection Time: 08/21/19 11:22 AM   Specimen: Abscess  Result Value Ref Range Status   Specimen Description ABSCESS  Final   Special Requests Normal  Final   Gram Stain   Final    MODERATE WBC PRESENT, PREDOMINANTLY MONONUCLEAR NO ORGANISMS SEEN    Culture   Final    NO GROWTH 3 DAYS NO ANAEROBES ISOLATED; CULTURE IN PROGRESS FOR 5 DAYS Performed at [facility], [place] [address] [place], [place] [address]    Report Status PENDING  Incomplete  Urine culture     Status: None   Collection Time: 08/22/19 12:53 PM   Specimen: In/Out Cath Urine  Result Value Ref Range Status    Specimen Description IN/OUT CATH URINE  Final   Special Requests NONE  Final   Culture   Final    NO GROWTH Performed at [facility], [place] [address] [place], [place] [address]    Report Status 08/23/2019 FINAL  Final    Radiology Studies: No results found.   Caoilainn Sacks T. [place]  If 7PM-7AM, please contact night-coverage [email or web address] Password [email or web address] 08/25/2019, 11:03 AM

## 2019-08-26 LAB — CBC
HCT: 30.4 % — ABNORMAL LOW (ref 39.0–52.0)
Hemoglobin: 9.5 g/dL — ABNORMAL LOW (ref 13.0–17.0)
MCH: 30.1 pg (ref 26.0–34.0)
MCHC: 31.3 g/dL (ref 30.0–36.0)
MCV: 96.2 fL (ref 80.0–100.0)
Platelets: 496 10*3/uL — ABNORMAL HIGH (ref 150–400)
RBC: 3.16 MIL/uL — ABNORMAL LOW (ref 4.22–5.81)
RDW: 14 % (ref 11.5–15.5)
WBC: 9.1 10*3/uL (ref 4.0–10.5)
nRBC: 0 % (ref 0.0–0.2)

## 2019-08-26 LAB — RENAL FUNCTION PANEL
Albumin: 1.9 g/dL — ABNORMAL LOW (ref 3.5–5.0)
Anion gap: 9 (ref 5–15)
BUN: 5 mg/dL — ABNORMAL LOW (ref 6–20)
CO2: 24 mmol/L (ref 22–32)
Calcium: 8.8 mg/dL — ABNORMAL LOW (ref 8.9–10.3)
Chloride: 106 mmol/L (ref 98–111)
Creatinine, Ser: 0.68 mg/dL (ref 0.61–1.24)
GFR calc Af Amer: >60 mL/min (ref >60)
GFR calc non Af Amer: >60 mL/min (ref >60)
Glucose, Bld: 111 mg/dL — ABNORMAL HIGH (ref 70–99)
Phosphorus: 4.5 mg/dL (ref 2.5–4.6)
Potassium: 3.7 mmol/L (ref 3.5–5.1)
Sodium: 139 mmol/L (ref 135–145)

## 2019-08-26 LAB — AEROBIC/ANAEROBIC CULTURE W GRAM STAIN (SURGICAL/DEEP WOUND)
Culture: NO GROWTH
Special Requests: NORMAL

## 2019-08-26 LAB — MAGNESIUM: Magnesium: 2 mg/dL (ref 1.7–2.4)

## 2019-08-26 MED ORDER — HYDROMORPHONE HCL 1 MG/ML IJ SOLN
0.5000 mg | INTRAMUSCULAR | Status: DC | PRN
  Administered 2019-08-26 – 2019-08-28 (×9): 0.5 mg via INTRAVENOUS
  Filled 2019-08-26 (×9): qty 0.5

## 2019-08-26 NOTE — Progress Notes (Signed)
PROGRESS NOTE  [name] [id number] DOB: 01/17/1966   PCP: Lesleigh Noe, MD  Patient is from: home  DOA: 08/20/2019 LOS: 6  Brief Narrative / Interim history: This is a 54 year old male with recent hospital admission from 12/29-1/6 for lumbar epidural abscess, cauda equina syndrome, severe spinal stenosis status post decompressive lumbar laminectomy L3-4, 4-5 for evacuation of epidural abscess on 12/30 and CT aspiration of right psoas abscess on 12/30 found to have MSSA bacteremia started on nafcillin for 6 weeks via PICC line planned through 2/9 per ID who was in CIR and discharged 1/15 and noticed pain and drainage of buttock area, brought to ED  In ED, septic with fever, tachycardia leukocytosis.  Started on vancomycin/Zosyn, IV fluids and found to have right psoas muscle intramuscular abscess.  General surgery and IR consulted.  General surgery was able to debride large amount of material at bedside on 1/25.  IR guided right psoas muscle abscess drainage on 1/26. Infectious disease consulted on 1/28 and recommended IV Unasyn through 09/17/2019, and signed off.  Per general surgery, no further debridement or surgical intervention required.  Recommended antibiotics, WTD TID, air mattress and turning q2h, and signed off.  IR following for drain, and planning repeat CT soon if drain remains minimal.  Assessment & Plan: Sepsis secondary to cellulitis, infected sacral decubitus and right psoas intramuscular abscess.  Surgical debridement and IR drainage with drain placement as above.  Sepsis physiology resolved.  MRSA PCR, blood and tissue cultures negative so far.  Was on nafcillin for MSSA bacteremia since 07/25/2019. -IR-reimage soon if drain output remains minimal (<10 cc) -General surgery-signed off. Recs: Antibiotics, WTD TID, air mattress, turning q2h -Vancomycin and Zosyn 1/25-1/28 -IV Unasyn 1/28>> 2/22 per ID. -Wound care following. -Continue Florastor -Pain control with  bowel regimen.  MSSA bacteremia: was on nafcillin since 07/25/2019, for a total of 6 weeks.   -Antibiotic now broadened and extended due to the above.  Acute toxic metabolic encephalopathy likely combination of medications and sepsis as above.  Resolved. -Minimize sedating medications -Treat treatable causes as above. -Reorientation and delirium precautions.  Lumbar epidural abscess/cauda equina syndrome with severe spinal stenosis at L3-L4 and L4-L5: Had decompressive lumbar laminectomies L3-4, L4-5 with partial medial facetectomies and foraminotomies of L3-4 and 5 on 07/25/2019 by Dr. Saintclair Halsted.  Has bilateral lower extremity weakness.  Denies bowel or bladder issue now. -PT/OT-HH  Anemia of chronic disease: b/l Hgb 10-11> 10.7 (admit)>> 9.2, likely some element of hemodilution, blood draws and surgical bleed.  Anemia panel consistent with ACD. -Continue monitoring  Hypokalemia: Resolved.  Alcohol use disorder: Should be outside withdrawal window. -Encourage cessation -Vitamins  Hyponatremia: Resolved. -Continue monitoring  Class II obesity Body mass index is 29.7 kg/m. -Encourage lifestyle change to lose weight.  Subjective: Seen and examined.  He has no complaint.  Objective: Vitals:   08/25/19 0415 08/25/19 1254 08/25/19 2022 08/26/19 0519  BP: 117/82 (!) 140/121 112/87 123/82  Pulse: 81 85 88 87  Resp: 18  18 18   Temp: 98.3 F (36.8 C) 98.4 F (36.9 C) 98.1 F (36.7 C) 98 F (36.7 C)  TempSrc: Oral Oral Oral Oral  SpO2: 99% 97% 100% 99%  Weight:    102.1 kg  Height:        Intake/Output Summary (Last 24 hours) at 08/26/2019 1242 Last data filed at 08/26/2019 0725 Gross per 24 hour  Intake 870 ml  Output 1296 ml  Net -426 ml   [employer]  08/24/19 0300 08/25/19 0101 08/26/19 0519  Weight: 103.4 kg 102.1 kg 102.1 kg    Examination:  General exam: Appears calm and comfortable  Respiratory system: Clear to auscultation. Respiratory effort normal.  Cardiovascular system: S1 & S2 heard, RRR. No JVD, murmurs, rubs, gallops or clicks. No pedal edema. Gastrointestinal system: Abdomen is nondistended, soft and nontender. No organomegaly or masses felt. Normal bowel sounds heard. Central nervous system: Alert and oriented.  4/5 power in both lower extremities Extremities: 4/5 power in both lower extremities drain in the lower back which has clear drainage. Skin: No rashes, lesions or ulcers.  Psychiatry: Judgement and insight appear normal. Mood & affect appropriate.   Procedures:  1/25-general surgery was able to debride large amount of material at bedside.  1/26-IR guided right psoas muscle abscess drainage on 1/26.  Pressure injury Pressure Injury 07/31/19 Buttocks Medial Stage 2 -  Partial thickness loss of dermis presenting as a shallow open injury with a red, pink wound bed without slough. (Active)  07/31/19 1640  Location: Buttocks  Location Orientation: Medial  Staging: Stage 2 -  Partial thickness loss of dermis presenting as a shallow open injury with a red, pink wound bed without slough.  Wound Description (Comments):   Present on Admission: Yes     Pressure Injury 07/31/19 Buttocks Right;Lower Stage 2 -  Partial thickness loss of dermis presenting as a shallow open injury with a red, pink wound bed without slough. (Active)  07/31/19 1644  Location: Buttocks  Location Orientation: Right;Lower  Staging: Stage 2 -  Partial thickness loss of dermis presenting as a shallow open injury with a red, pink wound bed without slough.  Wound Description (Comments):   Present on Admission: Yes     Pressure Injury 07/31/19 Buttocks Right;Upper Stage 2 -  Partial thickness loss of dermis presenting as a shallow open injury with a red, pink wound bed without slough. (Active)  07/31/19 1646  Location: Buttocks  Location Orientation: Right;Upper  Staging: Stage 2 -  Partial thickness loss of dermis presenting as a shallow open injury with a  red, pink wound bed without slough.  Wound Description (Comments):   Present on Admission: Yes     Pressure Injury 08/21/19 Medial (Active)  08/21/19 0200  Location:   Location Orientation: Medial  Staging:   Wound Description (Comments):   Present on Admission: Yes     Pressure Injury 08/21/19 Sacrum Unstageable - Full thickness tissue loss in which the base of the injury is covered by slough (yellow, tan, gray, green or brown) and/or eschar (tan, brown or black) in the wound bed. (Active)  08/21/19 2015  Location: Sacrum  Location Orientation:   Staging: Unstageable - Full thickness tissue loss in which the base of the injury is covered by slough (yellow, tan, gray, green or brown) and/or eschar (tan, brown or black) in the wound bed.  Wound Description (Comments):   Present on Admission: Yes     Pressure Injury 08/21/19 Buttocks Right Unstageable - Full thickness tissue loss in which the base of the injury is covered by slough (yellow, tan, gray, green or brown) and/or eschar (tan, brown or black) in the wound bed. (Active)  08/21/19 2015  Location: Buttocks  Location Orientation: Right  Staging: Unstageable - Full thickness tissue loss in which the base of the injury is covered by slough (yellow, tan, gray, green or brown) and/or eschar (tan, brown or black) in the wound bed.  Wound Description (Comments):   Present on  Admission: Yes     Nutrition Problem: Increased nutrient needs Etiology: wound healing  Signs/Symptoms: estimated needs  Interventions: MVI, Boost Breeze, Magic cup, Refer to RD note for recommendations   DVT prophylaxis: Subcu Lovenox Code Status: Full code Family Communication: None present at bedside. Disposition Plan: Remains inpatient for treatment of infected sacral decubitus and psoas abscess.  Final disposition likely home with home health and IV antibiotics once cleared by IR likely next 1 to 2 days.  Already has PICC line. Consultants: General  surgery (off), ID (off), IR    Microbiology summarized: 1/25-COVID-19 influenza PCR negative. 1/26-MRSA PCR negative. 1/25-blood cultures negative. 1/26-abscess cultures negative so far.  Sch Meds:  Scheduled Meds: . Chlorhexidine Gluconate Cloth  6 each Topical Daily  . enoxaparin (LOVENOX) injection  40 mg Subcutaneous Q24H  . feeding supplement  1 Container Oral TID BM  . folic acid  1 mg Oral Daily  . multivitamin with minerals  1 tablet Oral Daily  . sodium chloride flush  10-40 mL Intracatheter Q12H  . sodium chloride flush  5 mL Intracatheter Q8H  . tamsulosin  0.4 mg Oral QPC supper  . thiamine  100 mg Oral Daily   Or  . thiamine  100 mg Intravenous Daily   Continuous Infusions: . ampicillin-sulbactam (UNASYN) IV 3 g (08/26/19 0702)   PRN Meds:.acetaminophen **OR** acetaminophen, cyclobenzaprine, Melatonin, morphine injection, simethicone, sodium chloride flush  Antimicrobials: Anti-infectives (From admission, onward)   Start     Dose/Rate Route Frequency Ordered Stop   08/23/19 1230  Ampicillin-Sulbactam (UNASYN) 3 g in sodium chloride 0.9 % 100 mL IVPB     3 g 200 mL/hr over 30 Minutes Intravenous Every 6 hours 08/23/19 1018     08/21/19 0900  vancomycin (VANCOREADY) IVPB 1250 mg/250 mL  Status:  Discontinued     1,250 mg 166.7 mL/hr over 90 Minutes Intravenous Every 12 hours 08/20/19 2114 08/23/19 1018   08/21/19 0400  piperacillin-tazobactam (ZOSYN) IVPB 3.375 g  Status:  Discontinued     3.375 g 12.5 mL/hr over 240 Minutes Intravenous Every 8 hours 08/20/19 2314 08/23/19 1018   08/20/19 2100  vancomycin (VANCOCIN) 2,500 mg in sodium chloride 0.9 % 500 mL IVPB     2,500 mg 250 mL/hr over 120 Minutes Intravenous  Once 08/20/19 2059 08/20/19 2321   08/20/19 2045  piperacillin-tazobactam (ZOSYN) IVPB 3.375 g     3.375 g 100 mL/hr over 30 Minutes Intravenous  Once 08/20/19 2037 08/20/19 2130       I have personally reviewed the following labs and images:  CBC: Recent Labs  Lab 08/20/19 2016 08/21/19 0714 08/22/19 0500 08/23/19 0446 08/24/19 0548 08/25/19 0303 08/26/19 0510  WBC 14.8*   < > 10.2 10.0 8.0 9.6 9.1  NEUTROABS 12.8*  --   --   --   --   --   --   HGB 10.7*   < > 8.8* 8.9* 9.2* 9.2* 9.5*  HCT 32.4*   < > 27.3* 27.8* 29.1* 29.3* 30.4*  MCV 93.4   < > 95.1 94.9 96.0 96.4 96.2  PLT 512*   < > 427* 458* 483* 509* 496*   < > = values in this interval not displayed.   BMP &GFR Recent Labs  Lab 08/21/19 0714 08/21/19 1626 08/22/19 0500 08/23/19 0446 08/24/19 0548 08/25/19 0303 08/26/19 0510  NA 135   < > 135 136 136 137 139  K 3.6   < > 3.3* 3.9 3.5 3.5 3.7  CL 101   < > 101 100 102 103 106  CO2 24   < > 24 23 24 23 24   GLUCOSE 110*   < > 103* 104* 109* 107* 111*  BUN 7   < > <5* <5* <5* 6 5*  CREATININE 0.76   < > 0.68 0.67 0.63 0.64 0.68  CALCIUM 8.5*   < > 8.5* 8.8* 8.7* 8.7* 8.8*  MG 1.8  --  1.9 1.9 2.2 2.0 2.0  PHOS 4.3  --   --  4.4 4.8* 4.3 4.5   < > = values in this interval not displayed.   Estimated Creatinine Clearance: 132.6 mL/min (by C-G formula based on SCr of 0.68 mg/dL). Liver & Pancreas: Recent Labs  Lab 08/20/19 2016 08/20/19 2016 08/21/19 1626 08/23/19 0446 08/24/19 0548 08/25/19 0303 08/26/19 0510  AST 19  --  13*  --   --  25  --   ALT 17  --  13  --   --  32  --   ALKPHOS 61  --  51  --   --  54  --   BILITOT 1.2  --  1.0  --   --  0.7  --   PROT 7.6  --  6.4*  --   --  6.8  --   ALBUMIN 2.1*   < > 1.8* 1.7* 1.9* 2.0*  1.9* 1.9*   < > = values in this interval not displayed.   No results for input(s): LIPASE, AMYLASE in the last 168 hours. No results for input(s): AMMONIA in the last 168 hours. Diabetic: No results for input(s): HGBA1C in the last 72 hours. No results for input(s): GLUCAP in the last 168 hours. Cardiac Enzymes: No results for input(s): CKTOTAL, CKMB, CKMBINDEX, TROPONINI in the last 168 hours. No results for input(s): PROBNP in the last 8760 hours.  Coagulation Profile: Recent Labs  Lab 08/20/19 2057  INR 1.5*   Thyroid Function Tests: No results for input(s): TSH, T4TOTAL, FREET4, T3FREE, THYROIDAB in the last 72 hours. Lipid Profile: No results for input(s): CHOL, HDL, LDLCALC, TRIG, CHOLHDL, LDLDIRECT in the last 72 hours. Anemia Panel: No results for input(s): VITAMINB12, FOLATE, FERRITIN, TIBC, IRON, RETICCTPCT in the last 72 hours. Urine analysis:    Component Value Date/Time   COLORURINE YELLOW 08/22/2019 1216   APPEARANCEUR CLEAR 08/22/2019 1216   LABSPEC 1.014 08/22/2019 1216   PHURINE 5.0 08/22/2019 1216   GLUCOSEU NEGATIVE 08/22/2019 1216   HGBUR SMALL (A) 08/22/2019 1216   BILIRUBINUR NEGATIVE 08/22/2019 1216   KETONESUR NEGATIVE 08/22/2019 1216   PROTEINUR NEGATIVE 08/22/2019 1216   UROBILINOGEN 0.2 01/09/2013 1046   NITRITE NEGATIVE 08/22/2019 1216   LEUKOCYTESUR NEGATIVE 08/22/2019 1216   Sepsis Labs: Invalid input(s): PROCALCITONIN, [place]  Microbiology: Recent Results (from the past 240 hour(s))  Blood culture (routine x 2)     Status: None   Collection Time: 08/20/19  8:15 PM   Specimen: BLOOD LEFT ARM  Result Value Ref Range Status   Specimen Description BLOOD LEFT ARM  Final   Special Requests   Final    BOTTLES DRAWN AEROBIC AND ANAEROBIC Blood Culture adequate volume   Culture   Final    NO GROWTH 5 DAYS Performed at [facility], [address] [place], [place] [address]    Report Status 08/25/2019 FINAL  Final  Blood culture (routine x 2)     Status: None   Collection Time: 08/20/19  8:30 PM  Specimen: BLOOD LEFT ARM  Result Value Ref Range Status   Specimen Description BLOOD LEFT ARM  Final   Special Requests   Final    BOTTLES DRAWN AEROBIC ONLY Blood Culture results may not be optimal due to an excessive volume of blood received in culture bottles   Culture   Final    NO GROWTH 5 DAYS Performed at [facility], [place] [address], [place], [place] [address]     Report Status 08/25/2019 FINAL  Final  Respiratory Panel by RT PCR (Flu A&B, Covid) - Nasopharyngeal Swab     Status: None   Collection Time: 08/20/19 10:51 PM   Specimen: Nasopharyngeal Swab  Result Value Ref Range Status   SARS Coronavirus 2 by RT PCR NEGATIVE NEGATIVE Final    Comment: (NOTE) SARS-CoV-2 target nucleic acids are NOT DETECTED. The SARS-CoV-2 RNA is generally detectable in upper respiratoy specimens during the acute phase of infection. The lowest concentration of SARS-CoV-2 viral copies this assay can detect is 131 copies/mL. A negative result does not preclude SARS-Cov-2 infection and should not be used as the sole basis for treatment or other patient management decisions. A negative result may occur with  improper specimen collection/handling, submission of specimen other than nasopharyngeal swab, presence of viral mutation(s) within the areas targeted by this assay, and inadequate number of viral copies (<131 copies/mL). A negative result must be combined with clinical observations, patient history, and epidemiological information. The expected result is Negative. Fact Sheet for Patients:  PinkCheek.be Fact Sheet for Healthcare Providers:  GravelBags.it This test is not yet ap proved or cleared by the [place] FDA and  has been authorized for detection and/or diagnosis of SARS-CoV-2 by FDA under an Emergency Use Authorization (EUA). This EUA will remain  in effect (meaning this test can be used) for the duration of the COVID-19 declaration under Section 564(b)(1) of the Act, 21 U.S.C. section 360bbb-3(b)(1), unless the authorization is terminated or revoked sooner.    Influenza A by PCR NEGATIVE NEGATIVE Final   Influenza B by PCR NEGATIVE NEGATIVE Final    Comment: (NOTE) The Xpert Xpress SARS-CoV-2/FLU/RSV assay is intended as an aid in  the diagnosis of influenza from Nasopharyngeal swab specimens  and  should not be used as a sole basis for treatment. Nasal washings and  aspirates are unacceptable for Xpert Xpress SARS-CoV-2/FLU/RSV  testing. Fact Sheet for Patients: PinkCheek.be Fact Sheet for Healthcare Providers: GravelBags.it This test is not yet approved or cleared by the [place] FDA and  has been authorized for detection and/or diagnosis of SARS-CoV-2 by  FDA under an Emergency Use Authorization (EUA). This EUA will remain  in effect (meaning this test can be used) for the duration of the  Covid-19 declaration under Section 564(b)(1) of the Act, 21  U.S.C. section 360bbb-3(b)(1), unless the authorization is  terminated or revoked. Performed at [facility], [place] [address], [place], [place] [address]   MRSA PCR Screening     Status: None   Collection Time: 08/21/19  5:20 AM   Specimen: Nasopharyngeal  Result Value Ref Range Status   MRSA by PCR NEGATIVE NEGATIVE Final    Comment:        The GeneXpert MRSA Assay (FDA approved for NASAL specimens only), is one component of a comprehensive MRSA colonization surveillance program. It is not intended to diagnose MRSA infection nor to guide or monitor treatment for MRSA infections. Performed at [facility], [place]  [address], [place], [place] [address]   Aerobic/Anaerobic Culture (surgical/deep wound)     Status: None (Preliminary result)   Collection Time: 08/21/19 11:22 AM   Specimen: Abscess  Result Value Ref Range Status   Specimen Description ABSCESS  Final   Special Requests Normal  Final   Gram Stain   Final    MODERATE WBC PRESENT, PREDOMINANTLY MONONUCLEAR NO ORGANISMS SEEN    Culture   Final    NO GROWTH 4 DAYS Performed at [facility], [place] [address] [place], [place] [address]    Report Status PENDING  Incomplete  Urine culture     Status: None   Collection Time: 08/22/19 12:53 PM   Specimen: In/Out Cath Urine  Result  Value Ref Range Status   Specimen Description IN/OUT CATH URINE  Final   Special Requests NONE  Final   Culture   Final    NO GROWTH Performed at [facility], [place] [address], [place], [place] [address]    Report Status 08/23/2019 FINAL  Final    Radiology Studies: No results found.   Darliss Cheney, MD [facility]  If 7PM-7AM, please contact night-coverage [email or web address] 08/26/2019, 12:42 PM

## 2019-08-26 NOTE — Progress Notes (Addendum)
Supervising Physician: Sandi Mariscal  Patient Status:  [facility] - In-pt  Chief Complaint:  Back Pain  Subjective: 54 y.o, male inpatient. History of lumbar epidural abscess, causa equina syndroam and sever spinal stenosis (L3-5) Presented to [facility] with sever back pain forund to have a right sided psoas muscle absces. IR placed a drain on 1.26.21 previous aspiration on 10.30.20. Patient alert and laying in bed. Denies an back pain at this time. Patient endorses fatigue.  Allergies: Bee venom and Hydrocodone  Medications: Prior to Admission medications   Medication Sig Start Date End Date Taking? Authorizing Provider  acetaminophen (TYLENOL) 325 MG tablet Take 2 tablets (650 mg total) by mouth every 4 (four) hours as needed for mild pain ((score 1 to 3) or temp > 100.5). 08/09/19  Yes Angiulli, Lavon Paganini, PA-C  cyclobenzaprine (FLEXERIL) 10 MG tablet Take 1 tablet (10 mg total) by mouth 3 (three) times daily as needed for muscle spasms. 08/09/19  Yes Angiulli, Lavon Paganini, PA-C  nafcillin IVPB Inject 12 g into the vein daily. As a continuous infusion Indication:  Disseminated MSSA infection Last Day of Therapy:  09/04/19 Labs - Once weekly:  CBC/D and BMP, Labs - Every other week:  ESR and CRP 08/07/19 09/11/19 Yes Angiulli, Lavon Paganini, PA-C  Oxycodone HCl 10 MG TABS Take 1 tablet (10 mg total) by mouth every 4 (four) hours as needed ((score 7 to 10)). Patient taking differently: Take 10 mg by mouth every 4 (four) hours as needed (For pain).  08/20/19  Yes Lovorn, Jinny Blossom, MD  bisacodyl (DULCOLAX) 10 MG suppository Place 1 suppository (10 mg total) rectally daily as needed for moderate constipation. Patient not taking: Reported on 08/20/2019 08/09/19   Angiulli, Lavon Paganini, PA-C  Multiple Vitamin (MULTIVITAMIN WITH MINERALS) TABS tablet Take 1 tablet by mouth daily. Patient not taking: Reported on 08/20/2019 08/10/19   Angiulli, Lavon Paganini, PA-C  pantoprazole (PROTONIX) 40 MG tablet Take 1 tablet (40 mg  total) by mouth at bedtime. Patient not taking: Reported on 08/20/2019 08/09/19   Angiulli, Lavon Paganini, PA-C  polyethylene glycol (MIRALAX / GLYCOLAX) 17 g packet Take 17 g by mouth daily as needed for mild constipation. Patient not taking: Reported on 08/20/2019 08/09/19   Angiulli, Lavon Paganini, PA-C  predniSONE (DELTASONE) 5 MG tablet Take 1 tablet (5 mg total) by mouth daily with breakfast. Patient not taking: Reported on 08/20/2019 08/10/19   Angiulli, Lavon Paganini, PA-C  senna (SENOKOT) 8.6 MG TABS tablet Take 2 tablets (17.2 mg total) by mouth daily. Patient not taking: Reported on 08/20/2019 08/10/19   Angiulli, Lavon Paganini, PA-C  sodium chloride 0.9 % SOLN 452 mL with nafcillin 2 g SOLR 12 g Inject 12 g into the vein daily. Patient not taking: Reported on 08/20/2019 08/09/19   Angiulli, Lavon Paganini, PA-C  tamsulosin (FLOMAX) 0.4 MG CAPS capsule Take 1 capsule (0.4 mg total) by mouth daily after supper. Patient not taking: Reported on 08/20/2019 08/09/19   Angiulli, Lavon Paganini, PA-C  traZODone (DESYREL) 50 MG tablet Take 1 tablet (50 mg total) by mouth at bedtime as needed for sleep. Patient not taking: Reported on 08/20/2019 08/09/19   Cathlyn Parsons, PA-C     Vital Signs: BP 123/82 (BP Location: Left Arm)   Pulse 87   Temp 98 F (36.7 C) (Oral)   Resp 18   Ht _0  (1.854 m)   Wt 225 lb 1.4 oz (102.1 kg)   SpO2 99%   BMI 29.70  kg/m   Physical Exam Vitals and nursing note reviewed.  Constitutional:      Appearance: He is well-developed.  HENT:     Head: Normocephalic.  Pulmonary:     Effort: Pulmonary effort is normal.  Musculoskeletal:        General: Normal range of motion.     Cervical back: Normal range of motion.  Skin:    General: Skin is dry.     Comments: Drain site is unremarkable with no erythema, tenderness or drainage noted. Suture and stat lock in place. Dressing is clean dry and intact. 2 ml of  Straw colored fluid noted in the line of the  bulb suction device. Drain is able to  be flushed.  Sacral  pressure ulcer noted.    Neurological:     Mental Status: He is alert and oriented to person, place, and time.     Imaging: No results found.  Labs:  CBC: Recent Labs    08/23/19 0446 08/24/19 0548 08/25/19 0303 08/26/19 0510  WBC 10.0 8.0 9.6 9.1  HGB 8.9* 9.2* 9.2* 9.5*  HCT 27.8* 29.1* 29.3* 30.4*  PLT 458* 483* 509* 496*    COAGS: Recent Labs    07/24/19 1734 08/20/19 2057  INR 1.5* 1.5*  APTT 26 38*    BMP: Recent Labs    08/23/19 0446 08/24/19 0548 08/25/19 0303 08/26/19 0510  NA 136 136 137 139  K 3.9 3.5 3.5 3.7  CL 100 102 103 106  CO2 _0 GLUCOSE 104* 109* 107* 111*  BUN <5* <5* 6 5*  CALCIUM 8.8* 8.7* 8.7* 8.8*  CREATININE 0.67 0.63 0.64 0.68  GFRNONAA >60 >60 >60 >60  GFRAA >60 >60 >60 >60    LIVER FUNCTION TESTS: Recent Labs    08/13/19 1516 08/13/19 1516 08/20/19 2016 08/20/19 2016 08/21/19 1626 08/21/19 1626 08/23/19 0446 08/24/19 0548 08/25/19 0303 08/26/19 0510  BILITOT 1.2  --  1.2  --  1.0  --   --   --  0.7  --   AST 15  --  19  --  13*  --   --   --  25  --   ALT 20  --  17  --  13  --   --   --  32  --   ALKPHOS 65  --  61  --  51  --   --   --  54  --   PROT 6.3*  --  7.6  --  6.4*  --   --   --  6.8  --   ALBUMIN 2.3*   < > 2.1*   < > 1.8*   < > 1.7* 1.9* 2.0*  1.9* 1.9*   < > = values in this interval not displayed.    Assessment and Plan: 54 y.o, male inpatient. History of lumbar epidural abscess, causa equina syndroam and sever spinal stenosis (L3-5) Presented to [facility] with sever back pain found to have a right sided psoas muscle absces. IR placed a drain on 1.26.21 previous aspiration on 10.30.20. Per Epic output is:   10 ml, 20 ml, 7.5 ml    Pertinent Imaging None since placement  Pertinent IR History 1.26.21 - Placement of abscess drain psoas muscle 10.30.20 - Aspiration of right psoas muscle    All labs and medications are within acceptable parameters. Patient  afebrile  Recommend team continue with flushing TID, output recording q shift and dressing changes  as needed. Would consider additional imaging when output is less than 10 ml for 24 hours not including flush material.    Electronically Signed: Avel Peace, NP 08/26/2019, 9:35 AM   I spent a total of 15 Minutes at the the patient's bedside AND on the patient's hospital floor or unit, greater than 50% of which was counseling/coordinating care for psoas muscles abscess

## 2019-08-26 NOTE — Plan of Care (Signed)
  Problem: Nutrition: Goal: Adequate nutrition will be maintained Outcome: Completed/Met   Problem: Safety: Goal: Ability to remain free from injury will improve Outcome: Completed/Met   Problem: Skin Integrity: Goal: Risk for impaired skin integrity will decrease 08/26/2019 0123 by Ruben Im, RN Outcome: Completed/Met 08/26/2019 0123 by Ruben Im, RN Outcome: Progressing   Problem: Fluid Volume: Goal: Hemodynamic stability will improve 08/26/2019 0123 by Ruben Im, RN Outcome: Completed/Met 08/26/2019 0123 by Ruben Im, RN Outcome: Progressing   Problem: Clinical Measurements: Goal: Diagnostic test results will improve 08/26/2019 0123 by Ruben Im, RN Outcome: Completed/Met 08/26/2019 0123 by Ruben Im, RN Outcome: Progressing Goal: Signs and symptoms of infection will decrease 08/26/2019 0123 by Ruben Im, RN Outcome: Completed/Met 08/26/2019 0123 by Ruben Im, RN Outcome: Progressing   Problem: Respiratory: Goal: Ability to maintain adequate ventilation will improve Outcome: Completed/Met

## 2019-08-26 NOTE — Progress Notes (Signed)
Occupational Therapy Treatment Patient Details Name: [name] MRN: [id number] DOB: [birth year] Today's Date: 08/26/2019    History of present illness [name] is a 54 y.o. male presenting with complaints of drainage from right buttock wound. Patient had a recent hospital admission 12/29-1/6 for lumbar epidural abscess, cauda equina syndrome, severe spinal stenosis L3-4 L4-5 status post decompressive lumbar laminectomy 3-4,4-5 for evacuation of epidural abscess on 12/30 by Dr. Saintclair Halsted and CT aspiration of right psoas abscess per interventional radiology on 12/30.  Found to have MSSA bacteremia. Pt went to CIR and then home on 1/15, returns with drainage and pain in buttocks region.    OT comments  Pt. Seen for skilled OT.  Focus of session was bed mobility and in room ambulation to sink for grooming task.  Pt. Able to complete several grooming tasks S in standing.  Simulated toileting during in room mobility and transfers.  Note d/c home likely this week.  Pt. Moving well and reports good family support at home.    Follow Up Recommendations  Home health OT;Supervision/Assistance - 24 hour    Equipment Recommendations  None recommended by OT    Recommendations for Other Services      Precautions / Restrictions Precautions Precautions: Fall;Back Precaution Comments: JP drain       Mobility Bed Mobility Overal bed mobility: Needs Assistance Bed Mobility: Sidelying to Sit Rolling: Supervision Sidelying to sit: Supervision       General bed mobility comments: good log roll no cues or physicial assistance required  Transfers Overall transfer level: Needs assistance Equipment used: Rolling walker (2 wheeled) Transfers: Sit to/from [facility] Sit to Stand: Min guard Stand pivot transfers: Min guard       General transfer comment: ambulated eob to sink to recliner-reports feeling better and having better pain management if hes "up and moving". but also states  he does not want to ambulate in the hall    Balance                                           ADL either performed or assessed with clinical judgement   ADL Overall ADL's : Needs assistance/impaired     Grooming: Wash/dry hands;Wash/dry face;Oral care;Supervision/safety;Standing Grooming Details (indicate cue type and reason): braces arms on counter at times but did not appear to be breaking "no bending" portion of precautions                 Toilet Transfer: Min guard;Ambulation;RW Toilet Transfer Details (indicate cue type and reason): simulated during in room ambulation from eob to sink, to recliner         Functional mobility during ADLs: Min guard;Rolling walker General ADL Comments: able to complete grooming in standing at S level. this goal met. note d/c likely later this week     Vision       Perception     Praxis      Cognition Arousal/Alertness: Awake/alert Behavior During Therapy: Flat affect Overall Cognitive Status: Within Functional Limits for tasks assessed                                 General Comments: dry manor of speaking. sometimes difficult to decifer joking and real irritation "i dont give a damn what we do, what do you  want to do".  but then would smile and continue with pleasant conversation.        Exercises     Shoulder Instructions       General Comments  expresses frustration with being out of work.  States "ive never been out of work this long".      Pertinent Vitals/ Pain       Pain Assessment: Faces Faces Pain Scale: Hurts even more Pain Location: buttocks Pain Descriptors / Indicators: Grimacing;Guarding Pain Intervention(s): Limited activity within patient's tolerance;Monitored during session;Premedicated before session  Home Living                                          Prior Functioning/Environment              Frequency  Min 2X/week        Progress  Toward Goals  OT Goals(current goals can now be found in the care plan section)  Progress towards OT goals: Progressing toward goals     Plan Discharge plan remains appropriate    Co-evaluation                 AM-PAC OT "6 Clicks" Daily Activity     Outcome Measure   Help from another person eating meals?: None Help from another person taking care of personal grooming?: A Little Help from another person toileting, which includes using toliet, bedpan, or urinal?: A Little Help from another person bathing (including washing, rinsing, drying)?: A Little Help from another person to put on and taking off regular upper body clothing?: None Help from another person to put on and taking off regular lower body clothing?: A Little 6 Click Score: 20    End of Session Equipment Utilized During Treatment: Gait belt;Rolling walker  OT Visit Diagnosis: Unsteadiness on feet (R26.81);Muscle weakness (generalized) (M62.81);Pain   Activity Tolerance Patient tolerated treatment well   Patient Left in chair;with call bell/phone within reach   Nurse Communication          Time: 2277-3750 OT Time Calculation (min): 27 min  Charges: OT General Charges $OT Visit: 1 Visit OT Treatments $Self Care/Home Management : 23-37 mins  Sonia Baller, COTA/L Acute Rehabilitation [phone]   Janice Coffin 08/26/2019, 12:52 PM

## 2019-08-26 NOTE — Plan of Care (Signed)
  Problem: Nutrition: Goal: Adequate nutrition will be maintained Outcome: Completed/Met   Problem: Safety: Goal: Ability to remain free from injury will improve Outcome: Completed/Met   Problem: Respiratory: Goal: Ability to maintain adequate ventilation will improve Outcome: Completed/Met

## 2019-08-27 LAB — CBC
HCT: 29.9 % — ABNORMAL LOW (ref 39.0–52.0)
Hemoglobin: 9.5 g/dL — ABNORMAL LOW (ref 13.0–17.0)
MCH: 30.4 pg (ref 26.0–34.0)
MCHC: 31.8 g/dL (ref 30.0–36.0)
MCV: 95.5 fL (ref 80.0–100.0)
Platelets: 527 10*3/uL — ABNORMAL HIGH (ref 150–400)
RBC: 3.13 MIL/uL — ABNORMAL LOW (ref 4.22–5.81)
RDW: 14.2 % (ref 11.5–15.5)
WBC: 9.7 10*3/uL (ref 4.0–10.5)
nRBC: 0 % (ref 0.0–0.2)

## 2019-08-27 LAB — RENAL FUNCTION PANEL
Albumin: 1.9 g/dL — ABNORMAL LOW (ref 3.5–5.0)
Anion gap: 10 (ref 5–15)
BUN: <5 mg/dL — ABNORMAL LOW (ref 6–20)
CO2: 24 mmol/L (ref 22–32)
Calcium: 8.9 mg/dL (ref 8.9–10.3)
Chloride: 103 mmol/L (ref 98–111)
Creatinine, Ser: 0.61 mg/dL (ref 0.61–1.24)
GFR calc Af Amer: >60 mL/min (ref >60)
GFR calc non Af Amer: >60 mL/min (ref >60)
Glucose, Bld: 105 mg/dL — ABNORMAL HIGH (ref 70–99)
Phosphorus: 4.6 mg/dL (ref 2.5–4.6)
Potassium: 3.7 mmol/L (ref 3.5–5.1)
Sodium: 137 mmol/L (ref 135–145)

## 2019-08-27 LAB — MAGNESIUM: Magnesium: 1.9 mg/dL (ref 1.7–2.4)

## 2019-08-27 NOTE — Progress Notes (Signed)
Occupational Therapy Treatment Patient Details Name: [name] MRN: [id number] DOB: 09/20/1965 Today's Date: 08/27/2019    History of present illness [name] is a 54 y.o. male presenting with complaints of drainage from right buttock wound. Patient had a recent hospital admission 12/29-1/6 for lumbar epidural abscess, cauda equina syndrome, severe spinal stenosis L3-4 L4-5 status post decompressive lumbar laminectomy 3-4,4-5 for evacuation of epidural abscess on 12/30 by Dr. Saintclair Halsted and CT aspiration of right psoas abscess per interventional radiology on 12/30.  Found to have MSSA bacteremia. Pt went to CIR and then home on 1/15, returns with drainage and pain in buttocks region.    OT comments  Pt making steady progress towards OT goals this session. Session focus on functional mobility and seated grooming tasks. Pt agreeable to PT/OT session upon arrival and able to recall "BLT" when asked about back precautions, however, pt needed cues to incorporate precautions functionally into ADL routine as pt noted to bend forward when spitting into sink during oral care despite cues for compensatory method. Overall, pt requires minguard for functional mobility greater than a household distance with RW and supervision for seated UB ADLs at sink. DC plan currently remains appropriate, will follow acutely per POC.    Follow Up Recommendations  Home health OT;Supervision/Assistance - 24 hour    Equipment Recommendations  None recommended by OT    Recommendations for Other Services      Precautions / Restrictions Precautions Precautions: Back;Fall Precaution Booklet Issued: No Precaution Comments: mod fall; able to state "BLT" Restrictions Weight Bearing Restrictions: No Other Position/Activity Restrictions: JP drain       Mobility Bed Mobility Overal bed mobility: Modified Independent Bed Mobility: Rolling;Sidelying to Sit Rolling: Modified independent (Device/Increase time) Sidelying to  sit: Modified independent (Device/Increase time)       General bed mobility comments: good log roll no cues or physicial assistance required  Transfers Overall transfer level: Needs assistance Equipment used: Rolling walker (2 wheeled) Transfers: Sit to/from Stand Sit to Stand: Supervision;Min guard         General transfer comment: pt supervision for sit>stand from EOB with RW, but min guard for sit>stand from chair at sink needing cues for hand placment and overall technique    Balance Overall balance assessment: Modified Independent Sitting-balance support: Feet supported Sitting balance-Leahy Scale: Good     Standing balance support: Bilateral upper extremity supported;During functional activity Standing balance-Leahy Scale: Fair Standing balance comment: reliant on at least one hand support in static standing, 2 for dynamic                           ADL either performed or assessed with clinical judgement   ADL Overall ADL's : Needs assistance/impaired     Grooming: Wash/dry hands;Wash/dry face;Oral care;Sitting;Supervision/safety;Cueing for safety Grooming Details (indicate cue type and reason): pt request to complete ADLs seated at sink. pt  required supervision as pt noted to need cues to maintain back precautions     Lower Body Bathing: Min guard;With adaptive equipment;Sit to/from stand Lower Body Bathing Details (indicate cue type and reason): reports he used AE at baseline     Lower Body Dressing: Min guard;Sitting/lateral leans;With adaptive equipment Lower Body Dressing Details (indicate cue type and reason): reports using AE at baseline Toilet Transfer: Min guard;Ambulation;RW Toilet Transfer Details (indicate cue type and reason): simulated via functional mobility with RW, min guard mostly just to keep gown closed   Toileting -  Clothing Manipulation Details (indicate cue type and reason): education on not twisting around for pericare      Functional mobility during ADLs: Min guard;Rolling walker General ADL Comments: session focus on functional mobility, seated grooming tasks and back precaution education     Vision       Perception     Praxis      Cognition Arousal/Alertness: Awake/alert Behavior During Therapy: Flat affect Overall Cognitive Status: Within Functional Limits for tasks assessed Area of Impairment: Safety/judgement;Awareness                       Following Commands: Follows one step commands consistently Safety/Judgement: Decreased awareness of deficits;Decreased awareness of safety Awareness: Intellectual Problem Solving: Requires verbal cues General Comments: pt requires cues to maintain back precautions. Pt needed cues for safety/ judgment but likely at baseline        Exercises Other Exercises Other Exercises: standing B LE exercises: heel raises, hip abd, marching, mini squats x 10 reps each   Shoulder Instructions       General Comments      Pertinent Vitals/ Pain       Pain Assessment: Faces Pain Score: 8  Faces Pain Scale: Hurts little more Pain Location: back/bottom Pain Descriptors / Indicators: Grimacing;Discomfort Pain Intervention(s): Limited activity within patient's tolerance;Monitored during session;Repositioned;Premedicated before session  Home Living                                          Prior Functioning/Environment              Frequency  Min 2X/week        Progress Toward Goals  OT Goals(current goals can now be found in the care plan section)  Progress towards OT goals: Progressing toward goals  Acute Rehab OT Goals Patient Stated Goal: go home and go back to work OT Goal Formulation: With patient Time For Goal Achievement: 09/05/19 Potential to Achieve Goals: Good  Plan Discharge plan remains appropriate    Co-evaluation      Reason for Co-Treatment: To address functional/ADL transfers PT goals addressed  during session: Mobility/safety with mobility;Balance        AM-PAC OT "6 Clicks" Daily Activity     Outcome Measure   Help from another person eating meals?: None Help from another person taking care of personal grooming?: A Little Help from another person toileting, which includes using toliet, bedpan, or urinal?: A Little Help from another person bathing (including washing, rinsing, drying)?: A Little Help from another person to put on and taking off regular upper body clothing?: None Help from another person to put on and taking off regular lower body clothing?: A Little 6 Click Score: 20    End of Session Equipment Utilized During Treatment: Gait belt;Rolling walker  OT Visit Diagnosis: Unsteadiness on feet (R26.81);Muscle weakness (generalized) (M62.81);Pain   Activity Tolerance Patient tolerated treatment well   Patient Left in chair;with call bell/phone within reach   Nurse Communication          Time: [id number] OT Time Calculation (min): 27 min  Charges: OT General Charges $OT Visit: 1 Visit OT Treatments $Self Care/Home Management : 8-22 mins   Lanier Clam., COTA/L Acute Rehabilitation Services [phone] [place] 08/27/2019, 1:53 PM

## 2019-08-27 NOTE — Progress Notes (Signed)
Physical Therapy Treatment Patient Details Name: [name] MRN: [id number] DOB: 02/06/1966 Today's Date: 08/27/2019    History of Present Illness [name] is a 54 y.o. male presenting with complaints of drainage from right buttock wound. Patient had a recent hospital admission 12/29-1/6 for lumbar epidural abscess, cauda equina syndrome, severe spinal stenosis L3-4 L4-5 status post decompressive lumbar laminectomy 3-4,4-5 for evacuation of epidural abscess on 12/30 by Dr. Saintclair Halsted and CT aspiration of right psoas abscess per interventional radiology on 12/30.  Found to have MSSA bacteremia. Pt went to CIR and then home on 1/15, returns with drainage and pain in buttocks region.     PT Comments    Patient received in bed, agreeable to PT/OT session. Patient slightly agitated during session, possibly pain related. Patient performed bed mobility with mod independence ( use of bed rails and increased time). Performed sit to stand with min guard. Ambulated with rw and min guard/supervision assist 200 feet. Heavy reliance on rw. Performed standing LE exercises. Patient will continue to benefit from skilled PT while here to improve strength and activity tolerance.        Follow Up Recommendations  Home health PT;Supervision for mobility/OOB     Equipment Recommendations  None recommended by PT    Recommendations for Other Services       Precautions / Restrictions Precautions Precautions: Back;Fall Precaution Booklet Issued: No Precaution Comments: mod fall Restrictions Weight Bearing Restrictions: No    Mobility  Bed Mobility Overal bed mobility: Modified Independent Bed Mobility: Rolling;Supine to Sit Rolling: Modified independent (Device/Increase time) Sidelying to sit: Modified independent (Device/Increase time)       General bed mobility comments: good log roll no cues or physicial assistance required  Transfers Overall transfer level: Needs assistance Equipment used:  Rolling walker (2 wheeled) Transfers: Sit to/from Stand Sit to Stand: Supervision;Min guard            Ambulation/Gait Ambulation/Gait assistance: Min Gaffer (Feet): 200 Feet Assistive device: Rolling walker (2 wheeled) Gait Pattern/deviations: Step-through pattern;Narrow base of support Gait velocity: decreased   General Gait Details: heavy leaning on RW for support   Stairs             Wheelchair Mobility    Modified Rankin (Stroke Patients Only)       Balance Overall balance assessment: Modified Independent Sitting-balance support: Feet supported Sitting balance-Leahy Scale: Good     Standing balance support: Bilateral upper extremity supported;During functional activity Standing balance-Leahy Scale: Fair Standing balance comment: reliant on at least one hand support in static standing, 2 for dynamic                            Cognition Arousal/Alertness: Awake/alert Behavior During Therapy: Flat affect Overall Cognitive Status: Within Functional Limits for tasks assessed Area of Impairment: Safety/judgement;Awareness                       Following Commands: Follows one step commands consistently Safety/Judgement: Decreased awareness of deficits;Decreased awareness of safety Awareness: Intellectual Problem Solving: Requires verbal cues General Comments: cues needed to adhere to back precautions during activity      Exercises Other Exercises Other Exercises: standing B LE exercises: heel raises, hip abd, marching, mini squats x 10 reps each    General Comments        Pertinent Vitals/Pain Pain Assessment: 0-10 Pain Score: 8  Pain Location: back/bottom Pain Descriptors /  Indicators: Grimacing;Discomfort Pain Intervention(s): Limited activity within patient's tolerance;Monitored during session;Premedicated before session    Home Living                      Prior Function             PT Goals (current goals can now be found in the care plan section) Acute Rehab PT Goals Patient Stated Goal: go home and go back to work PT Goal Formulation: With patient Time For Goal Achievement: 09/04/19 Potential to Achieve Goals: Good Progress towards PT goals: Progressing toward goals    Frequency    Min 3X/week      PT Plan Current plan remains appropriate    Co-evaluation PT/OT/SLP Co-Evaluation/Treatment: Yes Reason for Co-Treatment: To address functional/ADL transfers PT goals addressed during session: Mobility/safety with mobility;Balance        AM-PAC PT "6 Clicks" Mobility   Outcome Measure  Help needed turning from your back to your side while in a flat bed without using bedrails?: None Help needed moving from lying on your back to sitting on the side of a flat bed without using bedrails?: A Little Help needed moving to and from a bed to a chair (including a wheelchair)?: A Little Help needed standing up from a chair using your arms (e.g., wheelchair or bedside chair)?: A Little Help needed to walk in hospital room?: A Little Help needed climbing 3-5 steps with a railing? : A Lot 6 Click Score: 18    End of Session Equipment Utilized During Treatment: Gait belt Activity Tolerance: Patient tolerated treatment well Patient left: in chair;with call bell/phone within reach;with bed alarm set Nurse Communication: Mobility status PT Visit Diagnosis: Difficulty in walking, not elsewhere classified (R26.2);Muscle weakness (generalized) (M62.81);Pain Pain - part of body: (back/bottom)     Time: [id number] PT Time Calculation (min) (ACUTE ONLY): 26 min  Charges:  $Gait Training: 8-22 mins                     [name] Smoak, PT, GCS 08/27/19,12:10 PM

## 2019-08-27 NOTE — Progress Notes (Signed)
PROGRESS NOTE  JOAOVICTOR LANEHART [id number] DOB: [birth year]   PCP: Lesleigh Noe, MD  Patient is from: home  DOA: 08/20/2019 LOS: 7  Brief Narrative / Interim history: This is a 54 year old male with recent hospital admission from 12/29-1/6 for lumbar epidural abscess, cauda equina syndrome, severe spinal stenosis status post decompressive lumbar laminectomy L3-4, 4-5 for evacuation of epidural abscess on 12/30 and CT aspiration of right psoas abscess on 12/30 found to have MSSA bacteremia started on nafcillin for 6 weeks via PICC line planned through 2/9 per ID who was in CIR and discharged 1/15 and noticed pain and drainage of buttock area, brought to ED  In ED, septic with fever, tachycardia leukocytosis.  Started on vancomycin/Zosyn, IV fluids and found to have right psoas muscle intramuscular abscess.  General surgery and IR consulted.  General surgery was able to debride large amount of material at bedside on 1/25.  IR guided right psoas muscle abscess drainage on 1/26. Infectious disease consulted on 1/28 and recommended IV Unasyn through 09/17/2019, and signed off.  Per general surgery, no further debridement or surgical intervention required.  Recommended antibiotics, WTD TID, air mattress and turning q2h, and signed off.  IR following for drain, and planning repeat CT soon if drain remains minimal.  Assessment & Plan: Sepsis secondary to cellulitis, infected sacral decubitus and right psoas intramuscular abscess.  Surgical debridement and IR drainage with drain placement as above.  Sepsis physiology resolved.  MRSA PCR, blood and tissue cultures negative so far.  Was on nafcillin for MSSA bacteremia since 07/25/2019. -IR-reimage soon if drain output remains minimal (<10 cc) -General surgery-signed off. Recs: Antibiotics, WTD TID, air mattress, turning q2h -Vancomycin and Zosyn 1/25-1/28 -IV Unasyn 1/28>> 2/22 per ID. -Wound care following. -Continue Florastor -Pain control with  bowel regimen.  MSSA bacteremia: was on nafcillin since 07/25/2019, for a total of 6 weeks.   -Antibiotic now broadened and extended due to the above.  Acute toxic metabolic encephalopathy likely combination of medications and sepsis as above.  Resolved. -Minimize sedating medications -Treat treatable causes as above. -Reorientation and delirium precautions.  Lumbar epidural abscess/cauda equina syndrome with severe spinal stenosis at L3-L4 and L4-L5: Had decompressive lumbar laminectomies L3-4, L4-5 with partial medial facetectomies and foraminotomies of L3-4 and 5 on 07/25/2019 by Dr. Saintclair Halsted.  Has bilateral lower extremity weakness.  Denies bowel or bladder issue now. -PT/OT-HH  Anemia of chronic disease: b/l Hgb 10-11> 10.7 (admit)>> 9.2, likely some element of hemodilution, blood draws and surgical bleed.  Anemia panel consistent with ACD. -Continue monitoring  Hypokalemia: Resolved.  Alcohol use disorder: Should be outside withdrawal window. -Encourage cessation -Vitamins  Hyponatremia: Resolved. -Continue monitoring  Class II obesity Body mass index is 29.7 kg/m. -Encourage lifestyle change to lose weight.  Subjective: Patient seen and examined.  Continues to complain of back pain which is stable but no other complaint.  Objective: Vitals:   08/26/19 0519 08/26/19 2000 08/27/19 0504 08/27/19 1222  BP: 123/82 117/80 123/81 107/71  Pulse: 87 89 92 (!) 112  Resp: 18 20 18    Temp: 98 F (36.7 C) 98.1 F (36.7 C) 98.5 F (36.9 C) 98.4 F (36.9 C)  TempSrc: Oral Oral Oral Oral  SpO2: 99% 100% 97% 98%  Weight: 102.1 kg  102.4 kg   Height:        Intake/Output Summary (Last 24 hours) at 08/27/2019 1312 Last data filed at 08/27/2019 0900 Gross per 24 hour  Intake 907.49 ml  Output 1696  ml  Net -788.51 ml   Filed Weights   08/25/19 0101 08/26/19 0519 08/27/19 0504  Weight: 102.1 kg 102.1 kg 102.4 kg    Examination: General exam: Appears calm and comfortable   Respiratory system: Clear to auscultation. Respiratory effort normal. Cardiovascular system: S1 & S2 heard, RRR. No JVD, murmurs, rubs, gallops or clicks. No pedal edema. Gastrointestinal system: Abdomen is nondistended, soft and nontender. No organomegaly or masses felt. Normal bowel sounds heard. Central nervous system: Alert and oriented.  4/5 power in both lower extremities.  Draining lower back has clear drainage. Extremities: Symmetric 5 x 5 power. Skin: No rashes, lesions or ulcers.  Psychiatry: Judgement and insight appear normal. Mood & affect appropriate.   Procedures:  1/25-general surgery was able to debride large amount of material at bedside.  1/26-IR guided right psoas muscle abscess drainage on 1/26.  Pressure injury Pressure Injury 07/31/19 Buttocks Medial Stage 2 -  Partial thickness loss of dermis presenting as a shallow open injury with a red, pink wound bed without slough. (Active)  07/31/19 1640  Location: Buttocks  Location Orientation: Medial  Staging: Stage 2 -  Partial thickness loss of dermis presenting as a shallow open injury with a red, pink wound bed without slough.  Wound Description (Comments):   Present on Admission: Yes     Pressure Injury 07/31/19 Buttocks Right;Lower Stage 2 -  Partial thickness loss of dermis presenting as a shallow open injury with a red, pink wound bed without slough. (Active)  07/31/19 1644  Location: Buttocks  Location Orientation: Right;Lower  Staging: Stage 2 -  Partial thickness loss of dermis presenting as a shallow open injury with a red, pink wound bed without slough.  Wound Description (Comments):   Present on Admission: Yes     Pressure Injury 07/31/19 Buttocks Right;Upper Stage 2 -  Partial thickness loss of dermis presenting as a shallow open injury with a red, pink wound bed without slough. (Active)  07/31/19 1646  Location: Buttocks  Location Orientation: Right;Upper  Staging: Stage 2 -  Partial thickness loss of  dermis presenting as a shallow open injury with a red, pink wound bed without slough.  Wound Description (Comments):   Present on Admission: Yes     Pressure Injury 08/21/19 Medial (Active)  08/21/19 0200  Location:   Location Orientation: Medial  Staging:   Wound Description (Comments):   Present on Admission: Yes     Pressure Injury 08/21/19 Sacrum Unstageable - Full thickness tissue loss in which the base of the injury is covered by slough (yellow, tan, gray, green or brown) and/or eschar (tan, brown or black) in the wound bed. (Active)  08/21/19 2015  Location: Sacrum  Location Orientation:   Staging: Unstageable - Full thickness tissue loss in which the base of the injury is covered by slough (yellow, tan, gray, green or brown) and/or eschar (tan, brown or black) in the wound bed.  Wound Description (Comments):   Present on Admission: Yes     Pressure Injury 08/21/19 Buttocks Right Unstageable - Full thickness tissue loss in which the base of the injury is covered by slough (yellow, tan, gray, green or brown) and/or eschar (tan, brown or black) in the wound bed. (Active)  08/21/19 2015  Location: Buttocks  Location Orientation: Right  Staging: Unstageable - Full thickness tissue loss in which the base of the injury is covered by slough (yellow, tan, gray, green or brown) and/or eschar (tan, brown or black) in the wound bed.  Wound Description (Comments):   Present on Admission: Yes     Nutrition Problem: Increased nutrient needs Etiology: wound healing  Signs/Symptoms: estimated needs  Interventions: MVI, Boost Breeze, Magic cup, Refer to RD note for recommendations   DVT prophylaxis: Subcu Lovenox Code Status: Full code Family Communication: None present at bedside. Disposition Plan: Remains inpatient for treatment of infected sacral decubitus and psoas abscess.  Final disposition likely home with home health and IV antibiotics once cleared by IR likely next 1 to 2 days.   Already has PICC line. Consultants: General surgery (off), ID (off), IR    Microbiology summarized: 1/25-COVID-19 influenza PCR negative. 1/26-MRSA PCR negative. 1/25-blood cultures negative. 1/26-abscess cultures negative so far.  Sch Meds:  Scheduled Meds: . Chlorhexidine Gluconate Cloth  6 each Topical Daily  . feeding supplement  1 Container Oral TID BM  . folic acid  1 mg Oral Daily  . multivitamin with minerals  1 tablet Oral Daily  . sodium chloride flush  10-40 mL Intracatheter Q12H  . sodium chloride flush  5 mL Intracatheter Q8H  . tamsulosin  0.4 mg Oral QPC supper  . thiamine  100 mg Oral Daily   Or  . thiamine  100 mg Intravenous Daily   Continuous Infusions: . ampicillin-sulbactam (UNASYN) IV 3 g (08/27/19 1302)   PRN Meds:.acetaminophen **OR** acetaminophen, cyclobenzaprine, HYDROmorphone (DILAUDID) injection, Melatonin, simethicone, sodium chloride flush  Antimicrobials: Anti-infectives (From admission, onward)   Start     Dose/Rate Route Frequency Ordered Stop   08/23/19 1230  Ampicillin-Sulbactam (UNASYN) 3 g in sodium chloride 0.9 % 100 mL IVPB     3 g 200 mL/hr over 30 Minutes Intravenous Every 6 hours 08/23/19 1018     08/21/19 0900  vancomycin (VANCOREADY) IVPB 1250 mg/250 mL  Status:  Discontinued     1,250 mg 166.7 mL/hr over 90 Minutes Intravenous Every 12 hours 08/20/19 2114 08/23/19 1018   08/21/19 0400  piperacillin-tazobactam (ZOSYN) IVPB 3.375 g  Status:  Discontinued     3.375 g 12.5 mL/hr over 240 Minutes Intravenous Every 8 hours 08/20/19 2314 08/23/19 1018   08/20/19 2100  vancomycin (VANCOCIN) 2,500 mg in sodium chloride 0.9 % 500 mL IVPB     2,500 mg 250 mL/hr over 120 Minutes Intravenous  Once 08/20/19 2059 08/20/19 2321   08/20/19 2045  piperacillin-tazobactam (ZOSYN) IVPB 3.375 g     3.375 g 100 mL/hr over 30 Minutes Intravenous  Once 08/20/19 2037 08/20/19 2130       I have personally reviewed the following labs and images:  CBC: Recent Labs  Lab 08/20/19 2016 08/21/19 0714 08/23/19 0446 08/24/19 0548 08/25/19 0303 08/26/19 0510 08/27/19 0234  WBC 14.8*   < > 10.0 8.0 9.6 9.1 9.7  NEUTROABS 12.8*  --   --   --   --   --   --   HGB 10.7*   < > 8.9* 9.2* 9.2* 9.5* 9.5*  HCT 32.4*   < > 27.8* 29.1* 29.3* 30.4* 29.9*  MCV 93.4   < > 94.9 96.0 96.4 96.2 95.5  PLT 512*   < > 458* 483* 509* 496* 527*   < > = values in this interval not displayed.   BMP &GFR Recent Labs  Lab 08/23/19 0446 08/24/19 0548 08/25/19 0303 08/26/19 0510 08/27/19 0234  NA 136 136 137 139 137  K 3.9 3.5 3.5 3.7 3.7  CL 100 102 103 106 103  CO2 23 24 23 24 24   GLUCOSE 104*  109* 107* 111* 105*  BUN <5* <5* 6 5* <5*  CREATININE 0.67 0.63 0.64 0.68 0.61  CALCIUM 8.8* 8.7* 8.7* 8.8* 8.9  MG 1.9 2.2 2.0 2.0 1.9  PHOS 4.4 4.8* 4.3 4.5 4.6   Estimated Creatinine Clearance: 132.7 mL/min (by C-G formula based on SCr of 0.61 mg/dL). Liver & Pancreas: Recent Labs  Lab 08/20/19 2016 08/20/19 2016 08/21/19 1626 08/21/19 1626 08/23/19 0446 08/24/19 0548 08/25/19 0303 08/26/19 0510 08/27/19 0234  AST 19  --  13*  --   --   --  25  --   --   ALT 17  --  13  --   --   --  32  --   --   ALKPHOS 61  --  51  --   --   --  54  --   --   BILITOT 1.2  --  1.0  --   --   --  0.7  --   --   PROT 7.6  --  6.4*  --   --   --  6.8  --   --   ALBUMIN 2.1*   < > 1.8*   < > 1.7* 1.9* 2.0*  1.9* 1.9* 1.9*   < > = values in this interval not displayed.   No results for input(s): LIPASE, AMYLASE in the last 168 hours. No results for input(s): AMMONIA in the last 168 hours. Diabetic: No results for input(s): HGBA1C in the last 72 hours. No results for input(s): GLUCAP in the last 168 hours. Cardiac Enzymes: No results for input(s): CKTOTAL, CKMB, CKMBINDEX, TROPONINI in the last 168 hours. No results for input(s): PROBNP in the last 8760 hours. Coagulation Profile: Recent Labs  Lab 08/20/19 2057  INR 1.5*   Thyroid Function Tests:  No results for input(s): TSH, T4TOTAL, FREET4, T3FREE, THYROIDAB in the last 72 hours. Lipid Profile: No results for input(s): CHOL, HDL, LDLCALC, TRIG, CHOLHDL, LDLDIRECT in the last 72 hours. Anemia Panel: No results for input(s): VITAMINB12, FOLATE, FERRITIN, TIBC, IRON, RETICCTPCT in the last 72 hours. Urine analysis:    Component Value Date/Time   COLORURINE YELLOW 08/22/2019 1216   APPEARANCEUR CLEAR 08/22/2019 1216   LABSPEC 1.014 08/22/2019 1216   PHURINE 5.0 08/22/2019 1216   GLUCOSEU NEGATIVE 08/22/2019 1216   HGBUR SMALL (A) 08/22/2019 1216   BILIRUBINUR NEGATIVE 08/22/2019 1216   KETONESUR NEGATIVE 08/22/2019 1216   PROTEINUR NEGATIVE 08/22/2019 1216   UROBILINOGEN 0.2 01/09/2013 1046   NITRITE NEGATIVE 08/22/2019 1216   LEUKOCYTESUR NEGATIVE 08/22/2019 1216   Sepsis Labs: Invalid input(s): PROCALCITONIN, [place]  Microbiology: Recent Results (from the past 240 hour(s))  Blood culture (routine x 2)     Status: None   Collection Time: 08/20/19  8:15 PM   Specimen: BLOOD LEFT ARM  Result Value Ref Range Status   Specimen Description BLOOD LEFT ARM  Final   Special Requests   Final    BOTTLES DRAWN AEROBIC AND ANAEROBIC Blood Culture adequate volume   Culture   Final    NO GROWTH 5 DAYS Performed at [facility], [address] [place], [place] [address]    Report Status 08/25/2019 FINAL  Final  Blood culture (routine x 2)     Status: None   Collection Time: 08/20/19  8:30 PM   Specimen: BLOOD LEFT ARM  Result Value Ref Range Status   Specimen Description BLOOD LEFT ARM  Final   Special Requests   Final  BOTTLES DRAWN AEROBIC ONLY Blood Culture results may not be optimal due to an excessive volume of blood received in culture bottles   Culture   Final    NO GROWTH 5 DAYS Performed at [facility] [address], [place], [place] [address]    Report Status 08/25/2019 FINAL  Final  Respiratory Panel by RT PCR (Flu A&B, Covid) -  Nasopharyngeal Swab     Status: None   Collection Time: 08/20/19 10:51 PM   Specimen: Nasopharyngeal Swab  Result Value Ref Range Status   SARS Coronavirus 2 by RT PCR NEGATIVE NEGATIVE Final    Comment: (NOTE) SARS-CoV-2 target nucleic acids are NOT DETECTED. The SARS-CoV-2 RNA is generally detectable in upper respiratoy specimens during the acute phase of infection. The lowest concentration of SARS-CoV-2 viral copies this assay can detect is 131 copies/mL. A negative result does not preclude SARS-Cov-2 infection and should not be used as the sole basis for treatment or other patient management decisions. A negative result may occur with  improper specimen collection/handling, submission of specimen other than nasopharyngeal swab, presence of viral mutation(s) within the areas targeted by this assay, and inadequate number of viral copies (<131 copies/mL). A negative result must be combined with clinical observations, patient history, and epidemiological information. The expected result is Negative. Fact Sheet for Patients:  PinkCheek.be Fact Sheet for Healthcare Providers:  GravelBags.it This test is not yet ap proved or cleared by the [place] FDA and  has been authorized for detection and/or diagnosis of SARS-CoV-2 by FDA under an Emergency Use Authorization (EUA). This EUA will remain  in effect (meaning this test can be used) for the duration of the COVID-19 declaration under Section 564(b)(1) of the Act, 21 U.S.C. section 360bbb-3(b)(1), unless the authorization is terminated or revoked sooner.    Influenza A by PCR NEGATIVE NEGATIVE Final   Influenza B by PCR NEGATIVE NEGATIVE Final    Comment: (NOTE) The Xpert Xpress SARS-CoV-2/FLU/RSV assay is intended as an aid in  the diagnosis of influenza from Nasopharyngeal swab specimens and  should not be used as a sole basis for treatment. Nasal washings and  aspirates  are unacceptable for Xpert Xpress SARS-CoV-2/FLU/RSV  testing. Fact Sheet for Patients: PinkCheek.be Fact Sheet for Healthcare Providers: GravelBags.it This test is not yet approved or cleared by the [place] FDA and  has been authorized for detection and/or diagnosis of SARS-CoV-2 by  FDA under an Emergency Use Authorization (EUA). This EUA will remain  in effect (meaning this test can be used) for the duration of the  Covid-19 declaration under Section 564(b)(1) of the Act, 21  U.S.C. section 360bbb-3(b)(1), unless the authorization is  terminated or revoked. Performed at [facility], [place] [address] [place], [place] [address]   MRSA PCR Screening     Status: None   Collection Time: 08/21/19  5:20 AM   Specimen: Nasopharyngeal  Result Value Ref Range Status   MRSA by PCR NEGATIVE NEGATIVE Final    Comment:        The GeneXpert MRSA Assay (FDA approved for NASAL specimens only), is one component of a comprehensive MRSA colonization surveillance program. It is not intended to diagnose MRSA infection nor to guide or monitor treatment for MRSA infections. Performed at [facility], [place] [address], [place], [place] [address]   Aerobic/Anaerobic Culture (surgical/deep wound)     Status: None   Collection Time: 08/21/19 11:22 AM   Specimen: Abscess  Result  Value Ref Range Status   Specimen Description ABSCESS  Final   Special Requests Normal  Final   Gram Stain   Final    MODERATE WBC PRESENT, PREDOMINANTLY MONONUCLEAR NO ORGANISMS SEEN    Culture   Final    No growth aerobically or anaerobically. Performed at [facility], [place] [address] [place], [place] [address]    Report Status 08/26/2019 FINAL  Final  Urine culture     Status: None   Collection Time: 08/22/19 12:53 PM   Specimen: In/Out Cath Urine  Result Value Ref Range Status   Specimen Description IN/OUT CATH URINE  Final    Special Requests NONE  Final   Culture   Final    NO GROWTH Performed at [facility], [place] [address] [place], [place] [address]    Report Status 08/23/2019 FINAL  Final    Radiology Studies: No results found.   Darliss Cheney, MD [facility]  If 7PM-7AM, please contact night-coverage [email or web address] 08/27/2019, 1:12 PM

## 2019-08-27 NOTE — Plan of Care (Signed)
  Problem: Education: Goal: Knowledge of General Education information will improve Description Including pain rating scale, medication(s)/side effects and non-pharmacologic comfort measures Outcome: Progressing   Problem: Clinical Measurements: Goal: Ability to maintain clinical measurements within normal limits will improve Outcome: Progressing Goal: Will remain free from infection Outcome: Progressing Goal: Diagnostic test results will improve Outcome: Progressing Goal: Respiratory complications will improve Outcome: Progressing Goal: Cardiovascular complication will be avoided Outcome: Progressing   Problem: Activity: Goal: Risk for activity intolerance will decrease Outcome: Progressing   Problem: Pain Managment: Goal: General experience of comfort will improve Outcome: Progressing   

## 2019-08-27 NOTE — Consult Note (Signed)
[facility] Nurse Consult Note: Patient receiving care in [place].  Patient has difficulty moving in bed due to the pain in the buttocks area. Reason for Consult: "worsening wound reported post surgical tunneling noted" Wound type: infectious Pressure Injury POA: Yes Measurement: Wound bed: visible wound beds are clean and pink Drainage (amount, consistency, odor) serosanginous on existing packing Periwound: erythematous, non-tender to palpation.  There is extensive pocketing and undermining extending to the coccyx and spine area and  Toward the right buttock and downward toward the posterior upper thigh.  Dressing procedure/placement/frequency: Continue TID saline packing and ABD pads to area. Monitor the wound area(s) for worsening of condition such as: Signs/symptoms of infection,  Increase in size,  Development of or worsening of odor, Development of pain, or increased pain at the affected locations.  Notify the medical team if any of these develop.  Thank you for the consult.  Discussed plan of care with the patient and bedside nurse.  [facility] nurse will not follow at this time.  Please re-consult the [facility] team if needed.  [name] Riles, RN, MSN, CWOCN, CNS-BC, pager [phone]

## 2019-08-27 NOTE — Progress Notes (Signed)
MD, Pt's daughter wanted a update on his progress of care, her # [phone], thanks, Arvella Nigh RN.

## 2019-08-27 NOTE — Progress Notes (Signed)
Referring Physician(s): Dr. Marlowe Sax  Supervising Physician: Jacqulynn Cadet  Patient Status:  [facility] - In-pt  Chief Complaint: Psoas abscess  Subjective: Sitting in chair.  Denies complaints.  Asking about when he can go home.  No complaints related to drain except "it's not doing much anymore."  Allergies: Bee venom and Hydrocodone  Medications: Prior to Admission medications   Medication Sig Start Date End Date Taking? Authorizing Provider  acetaminophen (TYLENOL) 325 MG tablet Take 2 tablets (650 mg total) by mouth every 4 (four) hours as needed for mild pain ((score 1 to 3) or temp > 100.5). 08/09/19  Yes Angiulli, Lavon Paganini, PA-C  cyclobenzaprine (FLEXERIL) 10 MG tablet Take 1 tablet (10 mg total) by mouth 3 (three) times daily as needed for muscle spasms. 08/09/19  Yes Angiulli, Lavon Paganini, PA-C  nafcillin IVPB Inject 12 g into the vein daily. As a continuous infusion Indication:  Disseminated MSSA infection Last Day of Therapy:  09/04/19 Labs - Once weekly:  CBC/D and BMP, Labs - Every other week:  ESR and CRP 08/07/19 09/11/19 Yes Angiulli, Lavon Paganini, PA-C  Oxycodone HCl 10 MG TABS Take 1 tablet (10 mg total) by mouth every 4 (four) hours as needed ((score 7 to 10)). Patient taking differently: Take 10 mg by mouth every 4 (four) hours as needed (For pain).  08/20/19  Yes Lovorn, Jinny Blossom, MD  bisacodyl (DULCOLAX) 10 MG suppository Place 1 suppository (10 mg total) rectally daily as needed for moderate constipation. Patient not taking: Reported on 08/20/2019 08/09/19   Angiulli, Lavon Paganini, PA-C  Multiple Vitamin (MULTIVITAMIN WITH MINERALS) TABS tablet Take 1 tablet by mouth daily. Patient not taking: Reported on 08/20/2019 08/10/19   Angiulli, Lavon Paganini, PA-C  pantoprazole (PROTONIX) 40 MG tablet Take 1 tablet (40 mg total) by mouth at bedtime. Patient not taking: Reported on 08/20/2019 08/09/19   Angiulli, Lavon Paganini, PA-C  polyethylene glycol (MIRALAX / GLYCOLAX) 17 g packet Take 17 g by  mouth daily as needed for mild constipation. Patient not taking: Reported on 08/20/2019 08/09/19   Angiulli, Lavon Paganini, PA-C  predniSONE (DELTASONE) 5 MG tablet Take 1 tablet (5 mg total) by mouth daily with breakfast. Patient not taking: Reported on 08/20/2019 08/10/19   Angiulli, Lavon Paganini, PA-C  senna (SENOKOT) 8.6 MG TABS tablet Take 2 tablets (17.2 mg total) by mouth daily. Patient not taking: Reported on 08/20/2019 08/10/19   Angiulli, Lavon Paganini, PA-C  sodium chloride 0.9 % SOLN 452 mL with nafcillin 2 g SOLR 12 g Inject 12 g into the vein daily. Patient not taking: Reported on 08/20/2019 08/09/19   Angiulli, Lavon Paganini, PA-C  tamsulosin (FLOMAX) 0.4 MG CAPS capsule Take 1 capsule (0.4 mg total) by mouth daily after supper. Patient not taking: Reported on 08/20/2019 08/09/19   Angiulli, Lavon Paganini, PA-C  traZODone (DESYREL) 50 MG tablet Take 1 tablet (50 mg total) by mouth at bedtime as needed for sleep. Patient not taking: Reported on 08/20/2019 08/09/19   Cathlyn Parsons, PA-C     Vital Signs: BP 107/71   Pulse (!) 112   Temp 98.4 F (36.9 C) (Oral)   Resp 18   Ht _0  (1.854 m)   Wt 225 lb 12 oz (102.4 kg)   SpO2 98%   BMI 29.78 kg/m   Physical Exam  NAD, alert Back:  Right-sided drain in place. Insertion site c/d/i. Clear, serous-appearing fluid in collection bulb which appears to be mostly flush.   Imaging: No  results found.  Labs:  CBC: Recent Labs    08/24/19 0548 08/25/19 0303 08/26/19 0510 08/27/19 0234  WBC 8.0 9.6 9.1 9.7  HGB 9.2* 9.2* 9.5* 9.5*  HCT 29.1* 29.3* 30.4* 29.9*  PLT 483* 509* 496* 527*    COAGS: Recent Labs    07/24/19 1734 08/20/19 2057  INR 1.5* 1.5*  APTT 26 38*    BMP: Recent Labs    08/24/19 0548 08/25/19 0303 08/26/19 0510 08/27/19 0234  NA 136 137 139 137  K 3.5 3.5 3.7 3.7  CL 102 103 106 103  CO2 _0 GLUCOSE 109* 107* 111* 105*  BUN <5* 6 5* <5*  CALCIUM 8.7* 8.7* 8.8* 8.9  CREATININE 0.63 0.64 0.68 0.61    GFRNONAA >60 >60 >60 >60  GFRAA >60 >60 >60 >60    LIVER FUNCTION TESTS: Recent Labs    08/13/19 1516 08/13/19 1516 08/20/19 2016 08/20/19 2016 08/21/19 1626 08/23/19 0446 08/24/19 0548 08/25/19 0303 08/26/19 0510 08/27/19 0234  BILITOT 1.2  --  1.2  --  1.0  --   --  0.7  --   --   AST 15  --  19  --  13*  --   --  25  --   --   ALT 20  --  17  --  13  --   --  32  --   --   ALKPHOS 65  --  61  --  51  --   --  54  --   --   PROT 6.3*  --  7.6  --  6.4*  --   --  6.8  --   --   ALBUMIN 2.3*   < > 2.1*   < > 1.8*   < > 1.9* 2.0*  1.9* 1.9* 1.9*   < > = values in this interval not displayed.    Assessment and Plan: Psoas abscess s/p aspiration and drainage 08/20/18 Patient continues to improve.  He remains on IV abx- Unasyn. Patient with variable output mostly consistent with flush. Currently being flushed with 10 mL TID.  Discussed with Dr. Laurence Ferrari who suspects resolve of fluid collection based on most recent imaging and with output description. Ok to pull drain.  Drain removed in its entirety at bedside without complication.  Dressing applied.   Electronically Signed: Docia Barrier, PA 08/27/2019, 3:59 PM   I spent a total of 15 Minutes at the the patient's bedside AND on the patient's hospital floor or unit, greater than 50% of which was counseling/coordinating care for psoas abscess.

## 2019-08-28 DIAGNOSIS — R7881 Bacteremia: Secondary | ICD-10-CM | POA: Diagnosis not present

## 2019-08-28 DIAGNOSIS — M4646 Discitis, unspecified, lumbar region: Secondary | ICD-10-CM | POA: Diagnosis not present

## 2019-08-28 LAB — BASIC METABOLIC PANEL
Anion gap: 9 (ref 5–15)
BUN: 6 mg/dL (ref 6–20)
CO2: 24 mmol/L (ref 22–32)
Calcium: 8.5 mg/dL — ABNORMAL LOW (ref 8.9–10.3)
Chloride: 104 mmol/L (ref 98–111)
Creatinine, Ser: 0.68 mg/dL (ref 0.61–1.24)
GFR calc Af Amer: >60 mL/min (ref >60)
GFR calc non Af Amer: >60 mL/min (ref >60)
Glucose, Bld: 109 mg/dL — ABNORMAL HIGH (ref 70–99)
Potassium: 3.8 mmol/L (ref 3.5–5.1)
Sodium: 137 mmol/L (ref 135–145)

## 2019-08-28 LAB — CBC
HCT: 30 % — ABNORMAL LOW (ref 39.0–52.0)
Hemoglobin: 9.6 g/dL — ABNORMAL LOW (ref 13.0–17.0)
MCH: 30.6 pg (ref 26.0–34.0)
MCHC: 32 g/dL (ref 30.0–36.0)
MCV: 95.5 fL (ref 80.0–100.0)
Platelets: 504 10*3/uL — ABNORMAL HIGH (ref 150–400)
RBC: 3.14 MIL/uL — ABNORMAL LOW (ref 4.22–5.81)
RDW: 14.4 % (ref 11.5–15.5)
WBC: 9.9 10*3/uL (ref 4.0–10.5)
nRBC: 0 % (ref 0.0–0.2)

## 2019-08-28 MED ORDER — AMPICILLIN-SULBACTAM IV (FOR PTA / DISCHARGE USE ONLY): 3.0000 g | Freq: Four times a day (QID) | INTRAVENOUS | 0 refills | Status: DC

## 2019-08-28 NOTE — Discharge Summary (Signed)
Physician Discharge Summary  [name]:[id number] DOB: 11/12/1965 DOA: 08/20/2019  PCP: Lesleigh Noe, MD  Admit date: 08/20/2019 Discharge date: 08/28/2019  Admitted From: Home Disposition: Home  Recommendations for Outpatient Follow-up:  1. Follow up with PCP in 1-2 weeks 2. Please obtain BMP/CBC in one week and then weekly until antibiotic course is completed 3. Please follow up on the following pending results:  Home Health: Yes Equipment/Devices: None  Discharge Condition: Stable CODE STATUS: Full code Diet recommendation: Cardiac  Subjective: Seen and examined.  No complaints.  Ready to go home.  Brief/Interim Summary: This is a 54 year old male with recent hospital admission from 12/29-1/6 for lumbar epidural abscess, cauda equina syndrome, severe spinal stenosis status post decompressive lumbar laminectomy L3-4, 4-5 for evacuation of epidural abscess on 12/30 and CT aspiration of right psoas abscess on 12/30 found to have MSSA bacteremia started on nafcillin for 6 weeks via PICC line planned through 2/9 per ID who was in CIR and discharged 1/15 and noticed pain and drainage of buttock area, brought to ED  In ED, septic with fever, tachycardia leukocytosis. Started on vancomycin/Zosyn, IV fluids and found to have right psoas muscle intramuscular abscess. General surgery and IR consulted.  General surgery was able to debride large amount of material at bedside on 1/25.  IR guided right psoas muscle abscess drainage on 1/26. Infectious disease consulted on 1/28 and recommended IV Unasyn through 09/17/2019, and signed off.  Per general surgery, no further debridement or surgical intervention required.  Recommended antibiotics, WTD TID, air mattress and turning q2h, and signed off.  IR continue to follow for drain management which was eventually removed on 08/27/2019.  Per IR, there was no need to have repeat imaging study.  Patient has been stable for last couple of days.   I have cleared the patient for discharge.  He is going to be discharged in stable condition and he will continue IV Unasyn through 09/17/2019.  Discharge Diagnoses:  Principal Problem:   Infected pressure ulcer Active Problems:   S/P lumbar laminectomy   H/O MSSA epidural abscess, L2-L5   Sepsis ([place])   Psoas abscess, right ([place])   Hyponatremia   Alcohol use    Discharge Instructions  Discharge Instructions    Discharge patient   Complete by: As directed    Discharge disposition: 06-Home-Health Care Svc   Discharge patient date: 08/28/2019   Home infusion instructions   Complete by: As directed    Instructions: Flushing of vascular access device: 0.9% NaCl pre/post medication administration and prn patency; Heparin 100 u/ml, 58m for implanted ports and Heparin 10u/ml, 533mfor all other central venous catheters.     Allergies as of 08/28/2019      Reactions   Bee Venom Anaphylaxis   Hydrocodone Nausea And Vomiting      Medication List    STOP taking these medications   nafcillin  IVPB     TAKE these medications   acetaminophen 325 MG tablet Commonly known as: TYLENOL Take 2 tablets (650 mg total) by mouth every 4 (four) hours as needed for mild pain ((score 1 to 3) or temp > 100.5).   ampicillin-sulbactam  IVPB Commonly known as: UNASYN Inject 3 g into the vein every 6 (six) hours for 25 days. Indication:  Wound Infection Last Day of Therapy:  09/18/19 Labs - Once weekly:  CBC/D and BMP, Labs - Every other week:  ESR and CRP   bisacodyl 10 MG suppository Commonly known as:  DULCOLAX Place 1 suppository (10 mg total) rectally daily as needed for moderate constipation.   cyclobenzaprine 10 MG tablet Commonly known as: FLEXERIL Take 1 tablet (10 mg total) by mouth 3 (three) times daily as needed for muscle spasms.   multivitamin with minerals Tabs tablet Take 1 tablet by mouth daily.   Oxycodone HCl 10 MG Tabs Take 1 tablet (10 mg total) by mouth every 4 (four)  hours as needed ((score 7 to 10)). What changed: reasons to take this   pantoprazole 40 MG tablet Commonly known as: PROTONIX Take 1 tablet (40 mg total) by mouth at bedtime.   polyethylene glycol 17 g packet Commonly known as: MIRALAX / GLYCOLAX Take 17 g by mouth daily as needed for mild constipation.   predniSONE 5 MG tablet Commonly known as: DELTASONE Take 1 tablet (5 mg total) by mouth daily with breakfast.   senna 8.6 MG Tabs tablet Commonly known as: SENOKOT Take 2 tablets (17.2 mg total) by mouth daily.   sodium chloride 0.9 % SOLN 452 mL with nafcillin 2 g SOLR 12 g Inject 12 g into the vein daily.   tamsulosin 0.4 MG Caps capsule Commonly known as: FLOMAX Take 1 capsule (0.4 mg total) by mouth daily after supper.   traZODone 50 MG tablet Commonly known as: DESYREL Take 1 tablet (50 mg total) by mouth at bedtime as needed for sleep.            Home Infusion Instuctions  (From admission, onward)         Start     Ordered   08/28/19 0000  Home infusion instructions    Question:  Instructions  Answer:  Flushing of vascular access device: 0.9% NaCl pre/post medication administration and prn patency; Heparin 100 u/ml, 32m for implanted ports and Heparin 10u/ml, 568mfor all other central venous catheters.   08/28/19 [id number]       Follow-up Information    CoLesleigh NoeMD Follow up in 1 week(s).   Specialty: Family Medicine Contact information: [address] [place] [phone]        Allergies  Allergen Reactions  . Bee Venom Anaphylaxis  . Hydrocodone Nausea And Vomiting    Consultations: Neurosurgery, ID, IR   Procedures/Studies: CT PELVIS W CONTRAST  Result Date: 08/20/2019 CLINICAL DATA:  Right buttock abscess EXAM: CT PELVIS WITH CONTRAST TECHNIQUE: Multidetector CT imaging of the pelvis was performed using the standard protocol following the bolus administration of intravenous contrast. CONTRAST:  10020mMNIPAQUE  IOHEXOL 300 MG/ML  SOLN COMPARISON:  07/19/2019 FINDINGS: Urinary Tract:  No abnormality visualized. Bowel:  Unremarkable visualized pelvic bowel loops. Vascular/Lymphatic: Atherosclerosis.  No aneurysm or adenopathy. Reproductive: Poorly visualized due to beam hardening artifact from bilateral hip replacements. Other:  No free fluid or free air. Musculoskeletal: Image quality in the pelvis is degraded due to extensive beam hardening artifact from bilateral hip replacements. There is gas noted throughout the soft tissues surrounding the lower sacrum and coccyx. No visible drainable fluid collection, but the gas throughout the soft tissues is compatible with infection, possibly with gas-forming organism. In addition, there is a rim enhancing low-density area within the right psoas muscle measuring 3.2 x 2.6 cm on axial image 13. On coronal image 76, this extends off the superior aspect of the study. The visualized portion has a craniocaudal length of 5.4 cm. IMPRESSION: Gas within the soft tissue surrounding the lower sacrum and coccyx concerning for  infection with gas-forming organism. No visible drainable fluid collection. Evaluation of this area is limited due to beam hardening artifact from bilateral hip replacements. Rim enhancing low-density area within the right psoas muscle which concerning for intramuscular abscess. Full extent extends above the 1st image for this CT pelvis. Electronically Signed   By: Rolm Baptise M.D.   On: 08/20/2019 21:45   DG CHEST PORT 1 VIEW  Result Date: 08/21/2019 CLINICAL DATA:  PICC placement EXAM: PORTABLE CHEST 1 VIEW COMPARISON:  07/24/2019 FINDINGS: Right arm PICC tip in the mid SVC. Lungs are clear without infiltrate effusion or edema. Cardiac enlargement. IMPRESSION: PICC tip in the mid SVC. Electronically Signed   By: Franchot Gallo M.D.   On: 08/21/2019 16:44   CT IMAGE GUIDED FLUID DRAIN BY CATHETER  Result Date: 08/21/2019 INDICATION: 54 year old male with a  persistent right psoas abscess despite prior CT-guided aspiration on 07/25/2019. He presents for CT-guided drain placement. EXAM: CT-guided drain placement MEDICATIONS: The patient is currently admitted to the hospital and receiving intravenous antibiotics. The antibiotics were administered within an appropriate time frame prior to the initiation of the procedure. ANESTHESIA/SEDATION: Fentanyl 100 mcg IV; Versed 2 mg IV Moderate Sedation Time:  20 minutes The patient was continuously monitored during the procedure by the interventional radiology nurse under my direct supervision. COMPLICATIONS: None immediate. PROCEDURE: Informed written consent was obtained from the patient after a thorough discussion of the procedural risks, benefits and alternatives. All questions were addressed. Maximal Sterile Barrier Technique was utilized including caps, mask, sterile gowns, sterile gloves, sterile drape, hand hygiene and skin antiseptic. A timeout was performed prior to the initiation of the procedure. The patient was placed prone on the gantry table. A limited axial CT scan was performed. The low-attenuation collection in the right psoas muscle was successfully identified. A suitable skin entry site was selected and marked. The overlying skin was sterilely prepped and draped in the standard fashion using chlorhexidine skin prep. Local anesthesia was attained by infiltration with 1% lidocaine. A small dermatotomy was made. Under intermittent CT guidance, an 18 gauge trocar needle was carefully advanced along a right paraspinal approach into the fluid collection. A 0.035 wire was then coiled in the fluid collection. The tract was dilated to 10 [place] and a [place] all-purpose drainage catheter was advanced over the wire and formed within the fluid collection. Aspiration yields 25 mL turbid yellow fluid. Samples were sent for culture. The drainage catheter was secured to the skin with 0 Prolene suture and connected to  JP bulb suction. Post drain placement CT imaging demonstrates a well-positioned drainage catheter with significant reduction in the volume of the intra psoas fluid collection. IMPRESSION: Successful placement of a 12 French drainage catheter into the right psoas fluid collection with aspiration of 25 mL turbid yellow fluid. Samples were sent for Gram stain and culture. PLAN: 1. Maintain drain to JP bulb suction. 2. Flush drain with 5-10 mL sterile saline or water once per shift. 3. Once drain output has been minimal (no more than the flush volume) for 48 hours, the drainage catheter can be removed. Signed, Criselda Peaches, MD, [facility] Electronically Signed   By: Jacqulynn Cadet M.D.   On: 08/21/2019 11:59   VAS [place] LOWER EXTREMITY VENOUS (DVT)  Result Date: 08/01/2019  Lower Venous Study Indications: Swelling.  Risk Factors: None identified. Comparison Study: No prior studies. Performing Technologist: Oliver Hum RVT  Examination Guidelines: A complete  evaluation includes B-mode imaging, spectral Doppler, color Doppler, and power Doppler as needed of all accessible portions of each vessel. Bilateral testing is considered an integral part of a complete examination. Limited examinations for reoccurring indications may be performed as noted.  +---------+---------------+---------+-----------+----------+--------------+ RIGHT    CompressibilityPhasicitySpontaneityPropertiesThrombus Aging +---------+---------------+---------+-----------+----------+--------------+ CFV      Full           Yes      Yes                                 +---------+---------------+---------+-----------+----------+--------------+ SFJ      Full                                                        +---------+---------------+---------+-----------+----------+--------------+ FV Prox  Full                                                         +---------+---------------+---------+-----------+----------+--------------+ FV Mid   Full                                                        +---------+---------------+---------+-----------+----------+--------------+ FV DistalFull                                                        +---------+---------------+---------+-----------+----------+--------------+ PFV      Full                                                        +---------+---------------+---------+-----------+----------+--------------+ POP      Full           Yes      Yes                                 +---------+---------------+---------+-----------+----------+--------------+ PTV      Full                                                        +---------+---------------+---------+-----------+----------+--------------+ PERO     Full                                                        +---------+---------------+---------+-----------+----------+--------------+   +---------+---------------+---------+-----------+----------+--------------+ LEFT  CompressibilityPhasicitySpontaneityPropertiesThrombus Aging +---------+---------------+---------+-----------+----------+--------------+ CFV      Full           Yes      Yes                                 +---------+---------------+---------+-----------+----------+--------------+ SFJ      Full                                                        +---------+---------------+---------+-----------+----------+--------------+ FV Prox  Full                                                        +---------+---------------+---------+-----------+----------+--------------+ FV Mid   Full                                                        +---------+---------------+---------+-----------+----------+--------------+ FV DistalFull                                                         +---------+---------------+---------+-----------+----------+--------------+ PFV      Full                                                        +---------+---------------+---------+-----------+----------+--------------+ POP      Full           Yes      Yes                                 +---------+---------------+---------+-----------+----------+--------------+ PTV      Full                                                        +---------+---------------+---------+-----------+----------+--------------+ PERO     Full                                                        +---------+---------------+---------+-----------+----------+--------------+     Summary: Right: There is no evidence of deep vein thrombosis in the lower extremity. No cystic structure found in the popliteal fossa. Left: There is no evidence of deep vein thrombosis in the lower extremity. No cystic structure found in the popliteal fossa.  *See table(s) above for  measurements and observations. Electronically signed by Curt Jews MD on 08/01/2019 at 4:40:35 PM.    Final    [place] EKG SITE RITE  Result Date: 07/30/2019 If Site Rite image not attached, placement could not be confirmed due to current cardiac rhythm.    Discharge Exam: Vitals:   08/27/19 1936 08/28/19 0513  BP: 123/73 129/83  Pulse: 96 93  Resp: 17 20  Temp: 99 F (37.2 C) 98 F (36.7 C)  SpO2: 100% 100%   Vitals:   08/27/19 1222 08/27/19 1936 08/28/19 0512 08/28/19 0513  BP: 107/71 123/73  129/83  Pulse: (!) 112 96  93  Resp:  17  20  Temp: 98.4 F (36.9 C) 99 F (37.2 C)  98 F (36.7 C)  TempSrc: Oral Oral  Oral  SpO2: 98% 100%  100%  Weight:   99.3 kg   Height:        General: Pt is alert, awake, not in acute distress Cardiovascular: RRR, S1/S2 +, no rubs, no gallops Respiratory: CTA bilaterally, no wheezing, no rhonchi Abdominal: Soft, NT, ND, bowel sounds + Extremities: no edema, no cyanosis, 4/5 power in bilateral lower  extremities    The results of significant diagnostics from this hospitalization (including imaging, microbiology, ancillary and laboratory) are listed below for reference.     Microbiology: Recent Results (from the past 240 hour(s))  Blood culture (routine x 2)     Status: None   Collection Time: 08/20/19  8:15 PM   Specimen: BLOOD LEFT ARM  Result Value Ref Range Status   Specimen Description BLOOD LEFT ARM  Final   Special Requests   Final    BOTTLES DRAWN AEROBIC AND ANAEROBIC Blood Culture adequate volume   Culture   Final    NO GROWTH 5 DAYS Performed at [facility], [address] [place], [place] [address]    Report Status 08/25/2019 FINAL  Final  Blood culture (routine x 2)     Status: None   Collection Time: 08/20/19  8:30 PM   Specimen: BLOOD LEFT ARM  Result Value Ref Range Status   Specimen Description BLOOD LEFT ARM  Final   Special Requests   Final    BOTTLES DRAWN AEROBIC ONLY Blood Culture results may not be optimal due to an excessive volume of blood received in culture bottles   Culture   Final    NO GROWTH 5 DAYS Performed at [facility], [place] [address] [place], [place] [address]    Report Status 08/25/2019 FINAL  Final  Respiratory Panel by RT PCR (Flu A&B, Covid) - Nasopharyngeal Swab     Status: None   Collection Time: 08/20/19 10:51 PM   Specimen: Nasopharyngeal Swab  Result Value Ref Range Status   SARS Coronavirus 2 by RT PCR NEGATIVE NEGATIVE Final    Comment: (NOTE) SARS-CoV-2 target nucleic acids are NOT DETECTED. The SARS-CoV-2 RNA is generally detectable in upper respiratoy specimens during the acute phase of infection. The lowest concentration of SARS-CoV-2 viral copies this assay can detect is 131 copies/mL. A negative result does not preclude SARS-Cov-2 infection and should not be used as the sole basis for treatment or other patient management decisions. A negative result may occur with  improper specimen  collection/handling, submission of specimen other than nasopharyngeal swab, presence of viral mutation(s) within the areas targeted by this assay, and inadequate number of viral copies (<131 copies/mL). A negative result must be combined with clinical observations, patient history, and  epidemiological information. The expected result is Negative. Fact Sheet for Patients:  PinkCheek.be Fact Sheet for Healthcare Providers:  GravelBags.it This test is not yet ap proved or cleared by the [place] FDA and  has been authorized for detection and/or diagnosis of SARS-CoV-2 by FDA under an Emergency Use Authorization (EUA). This EUA will remain  in effect (meaning this test can be used) for the duration of the COVID-19 declaration under Section 564(b)(1) of the Act, 21 U.S.C. section 360bbb-3(b)(1), unless the authorization is terminated or revoked sooner.    Influenza A by PCR NEGATIVE NEGATIVE Final   Influenza B by PCR NEGATIVE NEGATIVE Final    Comment: (NOTE) The Xpert Xpress SARS-CoV-2/FLU/RSV assay is intended as an aid in  the diagnosis of influenza from Nasopharyngeal swab specimens and  should not be used as a sole basis for treatment. Nasal washings and  aspirates are unacceptable for Xpert Xpress SARS-CoV-2/FLU/RSV  testing. Fact Sheet for Patients: PinkCheek.be Fact Sheet for Healthcare Providers: GravelBags.it This test is not yet approved or cleared by the [place] FDA and  has been authorized for detection and/or diagnosis of SARS-CoV-2 by  FDA under an Emergency Use Authorization (EUA). This EUA will remain  in effect (meaning this test can be used) for the duration of the  Covid-19 declaration under Section 564(b)(1) of the Act, 21  U.S.C. section 360bbb-3(b)(1), unless the authorization is  terminated or revoked. Performed at [facility], [place] [address], [place], [place] [address]   MRSA PCR Screening     Status: None   Collection Time: 08/21/19  5:20 AM   Specimen: Nasopharyngeal  Result Value Ref Range Status   MRSA by PCR NEGATIVE NEGATIVE Final    Comment:        The GeneXpert MRSA Assay (FDA approved for NASAL specimens only), is one component of a comprehensive MRSA colonization surveillance program. It is not intended to diagnose MRSA infection nor to guide or monitor treatment for MRSA infections. Performed at [facility], [place] [address], [place], [place] [address]   Aerobic/Anaerobic Culture (surgical/deep wound)     Status: None   Collection Time: 08/21/19 11:22 AM   Specimen: Abscess  Result Value Ref Range Status   Specimen Description ABSCESS  Final   Special Requests Normal  Final   Gram Stain   Final    MODERATE WBC PRESENT, PREDOMINANTLY MONONUCLEAR NO ORGANISMS SEEN    Culture   Final    No growth aerobically or anaerobically. Performed at [facility], [place] [address] [place], [place] [address]    Report Status 08/26/2019 FINAL  Final  Urine culture     Status: None   Collection Time: 08/22/19 12:53 PM   Specimen: In/Out Cath Urine  Result Value Ref Range Status   Specimen Description IN/OUT CATH URINE  Final   Special Requests NONE  Final   Culture   Final    NO GROWTH Performed at [facility], [place] [address], [place], [place] [address]    Report Status 08/23/2019 FINAL  Final     Labs: BNP (last 3 results) No results for input(s): BNP in the last 8760 hours. Basic Metabolic Panel: Recent Labs  Lab 08/23/19 0446 08/23/19 0446 08/24/19 0548 08/25/19 0303 08/26/19 0510 08/27/19 0234 08/28/19 0345  NA 136   < > 136 137 139 137 137  K 3.9   < > 3.5 3.5 3.7 3.7 3.8  CL 100   < >  102 103 106 103 104  CO2 23   < > '24 23 24 24 24  ' GLUCOSE 104*   < > 109* 107* 111* 105* 109*  BUN <5*   < > <5* 6 5* <5* 6  CREATININE 0.67   < > 0.63 0.64 0.68 0.61 0.68   CALCIUM 8.8*   < > 8.7* 8.7* 8.8* 8.9 8.5*  MG 1.9  --  2.2 2.0 2.0 1.9  --   PHOS 4.4  --  4.8* 4.3 4.5 4.6  --    < > = values in this interval not displayed.   Liver Function Tests: Recent Labs  Lab 08/21/19 1626 08/21/19 1626 08/23/19 0446 08/24/19 0548 08/25/19 0303 08/26/19 0510 08/27/19 0234  AST 13*  --   --   --  25  --   --   ALT 13  --   --   --  32  --   --   ALKPHOS 51  --   --   --  54  --   --   BILITOT 1.0  --   --   --  0.7  --   --   PROT 6.4*  --   --   --  6.8  --   --   ALBUMIN 1.8*   < > 1.7* 1.9* 2.0*  1.9* 1.9* 1.9*   < > = values in this interval not displayed.   No results for input(s): LIPASE, AMYLASE in the last 168 hours. No results for input(s): AMMONIA in the last 168 hours. CBC: Recent Labs  Lab 08/24/19 0548 08/25/19 0303 08/26/19 0510 08/27/19 0234 08/28/19 0345  WBC 8.0 9.6 9.1 9.7 9.9  HGB 9.2* 9.2* 9.5* 9.5* 9.6*  HCT 29.1* 29.3* 30.4* 29.9* 30.0*  MCV 96.0 96.4 96.2 95.5 95.5  PLT 483* 509* 496* 527* 504*   Cardiac Enzymes: No results for input(s): CKTOTAL, CKMB, CKMBINDEX, TROPONINI in the last 168 hours. BNP: Invalid input(s): POCBNP CBG: No results for input(s): GLUCAP in the last 168 hours. D-Dimer No results for input(s): DDIMER in the last 72 hours. Hgb A1c No results for input(s): HGBA1C in the last 72 hours. Lipid Profile No results for input(s): CHOL, HDL, LDLCALC, TRIG, CHOLHDL, LDLDIRECT in the last 72 hours. Thyroid function studies No results for input(s): TSH, T4TOTAL, T3FREE, THYROIDAB in the last 72 hours.  Invalid input(s): FREET3 Anemia work up No results for input(s): VITAMINB12, FOLATE, FERRITIN, TIBC, IRON, RETICCTPCT in the last 72 hours. Urinalysis    Component Value Date/Time   COLORURINE YELLOW 08/22/2019 1216   APPEARANCEUR CLEAR 08/22/2019 1216   LABSPEC 1.014 08/22/2019 1216   PHURINE 5.0 08/22/2019 1216   GLUCOSEU NEGATIVE 08/22/2019 1216   HGBUR SMALL (A) 08/22/2019 1216    BILIRUBINUR NEGATIVE 08/22/2019 1216   KETONESUR NEGATIVE 08/22/2019 1216   PROTEINUR NEGATIVE 08/22/2019 1216   UROBILINOGEN 0.2 01/09/2013 1046   NITRITE NEGATIVE 08/22/2019 1216   LEUKOCYTESUR NEGATIVE 08/22/2019 1216   Sepsis Labs Invalid input(s): PROCALCITONIN,  WBC,  LACTICIDVEN Microbiology Recent Results (from the past 240 hour(s))  Blood culture (routine x 2)     Status: None   Collection Time: 08/20/19  8:15 PM   Specimen: BLOOD LEFT ARM  Result Value Ref Range Status   Specimen Description BLOOD LEFT ARM  Final   Special Requests   Final    BOTTLES DRAWN AEROBIC AND ANAEROBIC Blood Culture adequate volume   Culture   Final    NO GROWTH  5 DAYS Performed at [facility], [place] [address], [place], [place] [address]    Report Status 08/25/2019 FINAL  Final  Blood culture (routine x 2)     Status: None   Collection Time: 08/20/19  8:30 PM   Specimen: BLOOD LEFT ARM  Result Value Ref Range Status   Specimen Description BLOOD LEFT ARM  Final   Special Requests   Final    BOTTLES DRAWN AEROBIC ONLY Blood Culture results may not be optimal due to an excessive volume of blood received in culture bottles   Culture   Final    NO GROWTH 5 DAYS Performed at [facility], [place] [address] [place], [place] [address]    Report Status 08/25/2019 FINAL  Final  Respiratory Panel by RT PCR (Flu A&B, Covid) - Nasopharyngeal Swab     Status: None   Collection Time: 08/20/19 10:51 PM   Specimen: Nasopharyngeal Swab  Result Value Ref Range Status   SARS Coronavirus 2 by RT PCR NEGATIVE NEGATIVE Final    Comment: (NOTE) SARS-CoV-2 target nucleic acids are NOT DETECTED. The SARS-CoV-2 RNA is generally detectable in upper respiratoy specimens during the acute phase of infection. The lowest concentration of SARS-CoV-2 viral copies this assay can detect is 131 copies/mL. A negative result does not preclude SARS-Cov-2 infection and should not be used as the sole basis for  treatment or other patient management decisions. A negative result may occur with  improper specimen collection/handling, submission of specimen other than nasopharyngeal swab, presence of viral mutation(s) within the areas targeted by this assay, and inadequate number of viral copies (<131 copies/mL). A negative result must be combined with clinical observations, patient history, and epidemiological information. The expected result is Negative. Fact Sheet for Patients:  PinkCheek.be Fact Sheet for Healthcare Providers:  GravelBags.it This test is not yet ap proved or cleared by the [place] FDA and  has been authorized for detection and/or diagnosis of SARS-CoV-2 by FDA under an Emergency Use Authorization (EUA). This EUA will remain  in effect (meaning this test can be used) for the duration of the COVID-19 declaration under Section 564(b)(1) of the Act, 21 U.S.C. section 360bbb-3(b)(1), unless the authorization is terminated or revoked sooner.    Influenza A by PCR NEGATIVE NEGATIVE Final   Influenza B by PCR NEGATIVE NEGATIVE Final    Comment: (NOTE) The Xpert Xpress SARS-CoV-2/FLU/RSV assay is intended as an aid in  the diagnosis of influenza from Nasopharyngeal swab specimens and  should not be used as a sole basis for treatment. Nasal washings and  aspirates are unacceptable for Xpert Xpress SARS-CoV-2/FLU/RSV  testing. Fact Sheet for Patients: PinkCheek.be Fact Sheet for Healthcare Providers: GravelBags.it This test is not yet approved or cleared by the [place] FDA and  has been authorized for detection and/or diagnosis of SARS-CoV-2 by  FDA under an Emergency Use Authorization (EUA). This EUA will remain  in effect (meaning this test can be used) for the duration of the  Covid-19 declaration under Section 564(b)(1) of the Act, 21  U.S.C. section  360bbb-3(b)(1), unless the authorization is  terminated or revoked. Performed at [facility], [place] [address], [place], [place] [address]   MRSA PCR Screening     Status: None   Collection Time: 08/21/19  5:20 AM   Specimen: Nasopharyngeal  Result Value Ref Range Status   MRSA by PCR NEGATIVE NEGATIVE Final    Comment:  The GeneXpert MRSA Assay (FDA approved for NASAL specimens only), is one component of a comprehensive MRSA colonization surveillance program. It is not intended to diagnose MRSA infection nor to guide or monitor treatment for MRSA infections. Performed at [facility], [place] [address] [place], [place] [address]   Aerobic/Anaerobic Culture (surgical/deep wound)     Status: None   Collection Time: 08/21/19 11:22 AM   Specimen: Abscess  Result Value Ref Range Status   Specimen Description ABSCESS  Final   Special Requests Normal  Final   Gram Stain   Final    MODERATE WBC PRESENT, PREDOMINANTLY MONONUCLEAR NO ORGANISMS SEEN    Culture   Final    No growth aerobically or anaerobically. Performed at [facility], [place] [address], [place], [place] [address]    Report Status 08/26/2019 FINAL  Final  Urine culture     Status: None   Collection Time: 08/22/19 12:53 PM   Specimen: In/Out Cath Urine  Result Value Ref Range Status   Specimen Description IN/OUT CATH URINE  Final   Special Requests NONE  Final   Culture   Final    NO GROWTH Performed at [facility], [place] [address], [place], [place] [address]    Report Status 08/23/2019 FINAL  Final     Time coordinating discharge: Over 30 minutes  SIGNED:   Darliss Cheney, MD  [facility] 08/28/2019, 8:37 AM  If 7PM-7AM, please contact night-coverage [email or web address]

## 2019-08-28 NOTE — Progress Notes (Signed)
Updated daughter, Ander Purpura, via phone. Would like case manager to call her regarding D/C info. Sticky note in chart.

## 2019-08-28 NOTE — TOC Transition Note (Signed)
Transition of Care [facility]) - CM/SW Discharge Note   Patient Details  Name: [name] MRN: [id number] Date of Birth: 03/19/1966  Transition of Care [facility]) CM/SW Contact:  Zenon Mayo, RN Phone Number: 08/28/2019, 9:28 AM   Clinical Narrative:    Patient is for dc today, he is active with [facility] for HHPT/HHOT.  Cory notified of dc today.  He is also active with [facility] for iv abx infusion.  Pam Chandler notified of dc today. NCM will give patient substance abuse resources.    Final next level of care: [place] Barriers to Discharge: No Barriers Identified   Patient Goals and CMS Choice Patient states their goals for this hospitalization and ongoing recovery are:: get better CMS Medicare.gov Compare Post Acute Care list provided to:: Patient Choice offered to / list presented to : Patient  Discharge Placement                       Discharge Plan and Services In-house Referral: NA Discharge Planning Services: CM Consult Post Acute Care Choice: Home Health          DME Arranged: (NA)         HH Arranged: PT, OT HH Agency: [facility] Agency Contacted: 09/21/19 Time HH Agency Contacted: 1000 Representative spoke with at [place]: [place] ([place]) Interventions     Readmission Risk Interventions Readmission Risk Prevention Plan 08/21/2019  Transportation Screening Complete  HRI or [place] Complete  Social Work Consult for [place] Planning/Counseling Complete  Palliative Care Screening Not Applicable  Medication Review Press photographer) Complete  Some recent data might be hidden

## 2019-08-28 NOTE — Care Management Important Message (Signed)
Important Message  Patient Details  Name: [name] MRN: [id number] Date of Birth: 05/26/1966   Medicare Important Message Given:  Yes     Orbie Pyo 08/28/2019, 12:32 PM

## 2019-08-28 NOTE — Progress Notes (Signed)
Discharge medication list and information reviewed with patient and daughter. VSS. PICC line remains intact for home infusions. Infusion nurse is meeting pt and daughter upon discharge. Wound care is following pt on discharge as well. No concerns at this time. Home health will follow.

## 2019-08-28 NOTE — Discharge Instructions (Signed)
Pressure Injury  A pressure injury is damage to the skin and underlying tissue that results from pressure being applied to an area of the body. It often affects people who must spend a long time in a bed or chair because of a medical condition. Pressure injuries usually occur:  Over bony parts of the body, such as the tailbone, shoulders, elbows, hips, heels, spine, ankles, and back of the head.  Under medical devices that make contact with the body, such as respiratory equipment, stockings, tubes, and splints. Pressure injuries start as reddened areas on the skin and can lead to pain and an open wound. What are the causes? This condition is caused by frequent or constant pressure to an area of the body. Decreased blood flow to the skin can eventually cause the skin tissue to die and break down, causing a wound. What increases the risk? You are more likely to develop this condition if you:  Are in the hospital or an extended care facility.  Are bedridden or in a wheelchair.  Have an injury or disease that keeps you from: ? Moving normally. ? Feeling pain or pressure.  Have a condition that: ? Makes you sleepy or less alert. ? Causes poor blood flow.  Need to wear a medical device.  Have poor control of your bladder or bowel functions (incontinence).  Have poor nutrition (malnutrition). If you are at risk for pressure injuries, your health care provider may recommend certain types of mattresses, mattress covers, pillows, cushions, or boots to help prevent them. These may include products filled with air, foam, gel, or sand. What are the signs or symptoms? Symptoms of this condition depend on the severity of the injury. Symptoms may include:  Red or dark areas of the skin.  Pain, warmth, or a change of skin texture.  Blisters.  An open wound. How is this diagnosed? This condition is diagnosed with a medical history and physical exam. You may also have tests, such as:  Blood  tests.  Imaging tests.  Blood flow tests. Your pressure injury will be staged based on its severity. Staging is based on:  The depth of the tissue injury, including whether there is exposure of muscle, bone, or tendon.  The cause of the pressure injury. How is this treated? This condition may be treated by:  Relieving or redistributing pressure on your skin. This includes: ? Frequently changing your position. ? Avoiding positions that caused the wound or that can make the wound worse. ? Using specific bed mattresses, chair cushions, or protective boots. ? Moving medical devices from an area of pressure, or placing padding between the skin and the device. ? Using foams, creams, or powders to prevent rubbing (friction) on the skin.  Keeping your skin clean and dry. This may include using a skin cleanser or skin barrier as told by your health care provider.  Cleaning your injury and removing any dead tissue from the wound (debridement).  Placing a bandage (dressing) over your injury.  Using medicines for pain or to prevent or treat infection. Surgery may be needed if other treatments are not working or if your injury is very deep. Follow these instructions at home: Wound care  Follow instructions from your health care provider about how to take care of your wound. Make sure you: ? Wash your hands with soap and water before and after you change your bandage (dressing). If soap and water are not available, use hand sanitizer. ? Change your dressing as told   by your health care provider.  Check your wound every day for signs of infection. Have a caregiver do this for you if you are not able. Check for: ? Redness, swelling, or increased pain. ? More fluid or blood. ? Warmth. ? Pus or a bad smell. Skin care  Keep your skin clean and dry. Gently pat your skin dry.  Do not rub or massage your skin.  You or a caregiver should check your skin every day for any changes in color or  any new blisters or sores (ulcers). Medicines  Take over-the-counter and prescription medicines only as told by your health care provider.  If you were prescribed an antibiotic medicine, take or apply it as told by your health care provider. Do not stop using the antibiotic even if your condition improves. Reducing and redistributing pressure  Do not lie or sit in one position for a long time. Move or change position every 1-2 hours, or as told by your health care provider.  Use pillows or cushions to reduce pressure. Ask your health care provider to recommend cushions or pads for you. General instructions   Eat a healthy diet that includes lots of protein.  Drink enough fluid to keep your urine pale yellow.  Be as active as you can every day. Ask your health care provider to suggest safe exercises or activities.  Do not abuse drugs or alcohol.  Do not use any products that contain nicotine or tobacco, such as cigarettes, e-cigarettes, and chewing tobacco. If you need help quitting, ask your health care provider.  Keep all follow-up visits as told by your health care provider. This is important. Contact a health care provider if:  You have: ? A fever or chills. ? Pain that is not helped by medicine. ? Any changes in skin color. ? New blisters or sores. ? Pus or a bad smell coming from your wound. ? Redness, swelling, or pain around your wound. ? More fluid or blood coming from your wound.  Your wound does not improve after 1-2 weeks of treatment. Summary  A pressure injury is damage to the skin and underlying tissue that results from pressure being applied to an area of the body.  Do not lie or sit in one position for a long time. Your health care provider may advise you to move or change position every 1-2 hours.  Follow instructions from your health care provider about how to take care of your wound.  Keep all follow-up visits as told by your health care provider. This  is important. This information is not intended to replace advice given to you by your health care provider. Make sure you discuss any questions you have with your health care provider. Document Revised: 02/08/2018 Document Reviewed: 02/08/2018 Elsevier Patient Education  2020 Elsevier Inc.  

## 2019-08-29 ENCOUNTER — Telehealth: Payer: Self-pay

## 2019-08-29 DIAGNOSIS — M5144 Schmorl's nodes, thoracic region: Secondary | ICD-10-CM | POA: Diagnosis not present

## 2019-08-29 DIAGNOSIS — R7881 Bacteremia: Secondary | ICD-10-CM | POA: Diagnosis not present

## 2019-08-29 DIAGNOSIS — M4804 Spinal stenosis, thoracic region: Secondary | ICD-10-CM | POA: Diagnosis not present

## 2019-08-29 DIAGNOSIS — G834 Cauda equina syndrome: Secondary | ICD-10-CM | POA: Diagnosis not present

## 2019-08-29 DIAGNOSIS — G061 Intraspinal abscess and granuloma: Secondary | ICD-10-CM | POA: Diagnosis not present

## 2019-08-29 DIAGNOSIS — M47816 Spondylosis without myelopathy or radiculopathy, lumbar region: Secondary | ICD-10-CM | POA: Diagnosis not present

## 2019-08-29 DIAGNOSIS — B9561 Methicillin susceptible Staphylococcus aureus infection as the cause of diseases classified elsewhere: Secondary | ICD-10-CM | POA: Diagnosis not present

## 2019-08-29 DIAGNOSIS — M4604 Spinal enthesopathy, thoracic region: Secondary | ICD-10-CM | POA: Diagnosis not present

## 2019-08-29 DIAGNOSIS — I081 Rheumatic disorders of both mitral and tricuspid valves: Secondary | ICD-10-CM | POA: Diagnosis not present

## 2019-08-29 DIAGNOSIS — T8142XA Infection following a procedure, deep incisional surgical site, initial encounter: Secondary | ICD-10-CM | POA: Diagnosis not present

## 2019-08-29 DIAGNOSIS — K6812 Psoas muscle abscess: Secondary | ICD-10-CM | POA: Diagnosis not present

## 2019-08-29 DIAGNOSIS — M4316 Spondylolisthesis, lumbar region: Secondary | ICD-10-CM | POA: Diagnosis not present

## 2019-08-29 DIAGNOSIS — M4646 Discitis, unspecified, lumbar region: Secondary | ICD-10-CM | POA: Diagnosis not present

## 2019-08-29 DIAGNOSIS — L089 Local infection of the skin and subcutaneous tissue, unspecified: Secondary | ICD-10-CM

## 2019-08-29 DIAGNOSIS — M48061 Spinal stenosis, lumbar region without neurogenic claudication: Secondary | ICD-10-CM | POA: Diagnosis not present

## 2019-08-29 DIAGNOSIS — L89894 Pressure ulcer of other site, stage 4: Secondary | ICD-10-CM | POA: Diagnosis not present

## 2019-08-29 DIAGNOSIS — L89312 Pressure ulcer of right buttock, stage 2: Secondary | ICD-10-CM | POA: Diagnosis not present

## 2019-08-29 NOTE — Telephone Encounter (Signed)
Spoke with Diane. Patient has 2 large wounds in the sacral area. Hospital sent patient home with no directions or referrals. Needs an urgent referral to wound center.  Also asking for nursing [facility] 2 x this week and trying to increase it to 3 times a week. Diane is trying to get patient a wound vac. Patient is getting IV antibiotics every 6 hours.

## 2019-08-29 NOTE — Telephone Encounter (Signed)
Diane nurse with [facility], l/m stating patient was D/C from the hospital and he has a large wound and she would like a referral to wound center to help take care of this. Also needs nursing orders. CB is [phone].  I called Diane to get more information but could not leave a message because mailbox was full. Will try to call again later today.

## 2019-08-29 NOTE — Telephone Encounter (Signed)
[facility] for [place].  Please place referral to wound.

## 2019-08-29 NOTE — Telephone Encounter (Signed)
Referral placed. [name] advised of verbal orders and referral been placed

## 2019-08-30 DIAGNOSIS — M4646 Discitis, unspecified, lumbar region: Secondary | ICD-10-CM | POA: Diagnosis not present

## 2019-08-30 DIAGNOSIS — M48061 Spinal stenosis, lumbar region without neurogenic claudication: Secondary | ICD-10-CM | POA: Diagnosis not present

## 2019-08-30 DIAGNOSIS — K6812 Psoas muscle abscess: Secondary | ICD-10-CM | POA: Diagnosis not present

## 2019-08-30 DIAGNOSIS — M47816 Spondylosis without myelopathy or radiculopathy, lumbar region: Secondary | ICD-10-CM | POA: Diagnosis not present

## 2019-08-30 DIAGNOSIS — M4604 Spinal enthesopathy, thoracic region: Secondary | ICD-10-CM | POA: Diagnosis not present

## 2019-08-30 DIAGNOSIS — G834 Cauda equina syndrome: Secondary | ICD-10-CM | POA: Diagnosis not present

## 2019-08-30 DIAGNOSIS — B9561 Methicillin susceptible Staphylococcus aureus infection as the cause of diseases classified elsewhere: Secondary | ICD-10-CM | POA: Diagnosis not present

## 2019-08-30 DIAGNOSIS — L89894 Pressure ulcer of other site, stage 4: Secondary | ICD-10-CM | POA: Diagnosis not present

## 2019-08-30 DIAGNOSIS — R7881 Bacteremia: Secondary | ICD-10-CM | POA: Diagnosis not present

## 2019-08-30 DIAGNOSIS — M5144 Schmorl's nodes, thoracic region: Secondary | ICD-10-CM | POA: Diagnosis not present

## 2019-08-30 DIAGNOSIS — M4316 Spondylolisthesis, lumbar region: Secondary | ICD-10-CM | POA: Diagnosis not present

## 2019-08-30 DIAGNOSIS — M4804 Spinal stenosis, thoracic region: Secondary | ICD-10-CM | POA: Diagnosis not present

## 2019-08-30 DIAGNOSIS — I081 Rheumatic disorders of both mitral and tricuspid valves: Secondary | ICD-10-CM | POA: Diagnosis not present

## 2019-08-30 DIAGNOSIS — T8142XA Infection following a procedure, deep incisional surgical site, initial encounter: Secondary | ICD-10-CM | POA: Diagnosis not present

## 2019-08-30 DIAGNOSIS — L89312 Pressure ulcer of right buttock, stage 2: Secondary | ICD-10-CM | POA: Diagnosis not present

## 2019-08-30 DIAGNOSIS — G061 Intraspinal abscess and granuloma: Secondary | ICD-10-CM | POA: Diagnosis not present

## 2019-09-03 ENCOUNTER — Telehealth: Payer: Self-pay | Admitting: Family Medicine

## 2019-09-03 ENCOUNTER — Other Ambulatory Visit: Payer: Self-pay

## 2019-09-03 ENCOUNTER — Ambulatory Visit: Payer: BC Managed Care – PPO | Admitting: Internal Medicine

## 2019-09-03 ENCOUNTER — Encounter: Payer: Self-pay | Admitting: Internal Medicine

## 2019-09-03 DIAGNOSIS — M4316 Spondylolisthesis, lumbar region: Secondary | ICD-10-CM | POA: Diagnosis not present

## 2019-09-03 DIAGNOSIS — T8142XA Infection following a procedure, deep incisional surgical site, initial encounter: Secondary | ICD-10-CM | POA: Diagnosis not present

## 2019-09-03 DIAGNOSIS — M47816 Spondylosis without myelopathy or radiculopathy, lumbar region: Secondary | ICD-10-CM | POA: Diagnosis not present

## 2019-09-03 DIAGNOSIS — G834 Cauda equina syndrome: Secondary | ICD-10-CM | POA: Diagnosis not present

## 2019-09-03 DIAGNOSIS — B9561 Methicillin susceptible Staphylococcus aureus infection as the cause of diseases classified elsewhere: Secondary | ICD-10-CM | POA: Diagnosis not present

## 2019-09-03 DIAGNOSIS — M4646 Discitis, unspecified, lumbar region: Secondary | ICD-10-CM | POA: Diagnosis not present

## 2019-09-03 DIAGNOSIS — M4804 Spinal stenosis, thoracic region: Secondary | ICD-10-CM | POA: Diagnosis not present

## 2019-09-03 DIAGNOSIS — K6812 Psoas muscle abscess: Secondary | ICD-10-CM

## 2019-09-03 DIAGNOSIS — R7881 Bacteremia: Secondary | ICD-10-CM | POA: Diagnosis not present

## 2019-09-03 DIAGNOSIS — M4604 Spinal enthesopathy, thoracic region: Secondary | ICD-10-CM | POA: Diagnosis not present

## 2019-09-03 DIAGNOSIS — G061 Intraspinal abscess and granuloma: Secondary | ICD-10-CM | POA: Diagnosis not present

## 2019-09-03 DIAGNOSIS — I081 Rheumatic disorders of both mitral and tricuspid valves: Secondary | ICD-10-CM | POA: Diagnosis not present

## 2019-09-03 DIAGNOSIS — M48061 Spinal stenosis, lumbar region without neurogenic claudication: Secondary | ICD-10-CM | POA: Diagnosis not present

## 2019-09-03 DIAGNOSIS — L89312 Pressure ulcer of right buttock, stage 2: Secondary | ICD-10-CM | POA: Diagnosis not present

## 2019-09-03 DIAGNOSIS — L89894 Pressure ulcer of other site, stage 4: Secondary | ICD-10-CM | POA: Diagnosis not present

## 2019-09-03 DIAGNOSIS — M5144 Schmorl's nodes, thoracic region: Secondary | ICD-10-CM | POA: Diagnosis not present

## 2019-09-03 NOTE — Telephone Encounter (Signed)
It is important that he is seen in person.   However, with several follow-up visits this week we could reschedule to next week since he will have several providers seeing him now.

## 2019-09-03 NOTE — Assessment & Plan Note (Signed)
He is improving slowly on therapy for widely disseminated MSSA infection and sacral wound infection.  At least 2 more weeks of IV ampicillin sulbactam.  He will get repeat lab work today and follow-up in 2 weeks.

## 2019-09-03 NOTE — Progress Notes (Signed)
[facility] for Infectious Disease  Patient Active Problem List   Diagnosis Date Noted  . Sepsis ([place]) 08/20/2019  . Infected pressure ulcer 08/20/2019  . Psoas abscess, right ([place]) 08/20/2019  . Hyponatremia 08/20/2019  . Alcohol use 08/20/2019  . Pressure injury of skin 08/02/2019  . Cauda equina syndrome ([place]) 07/31/2019  . Neurogenic bladder 07/31/2019  . Bacteremia due to methicillin susceptible Staphylococcus aureus (MSSA) 07/30/2019  . Lumbar discitis 07/30/2019  . 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 S/P lumbar laminectomy 07/25/2019  . H/O MSSA epidural abscess, L2-L5 07/25/2019  . Polymyalgia rheumatica ([place]) 07/24/2019  . Acute bilateral low back pain without sciatica 07/18/2019  . Abdominal pain 07/18/2019  . Anemia 07/18/2019  . Obesity (BMI 35.0-39.9 without comorbidity) 07/18/2019  . Constipation 07/18/2019  . Bilateral leg pain 07/12/2019  . Bilateral leg edema 04/01/2017  . Elevated blood sugar 04/01/2017  . Avascular necrosis of bone of hip, left ([place]) 10/12/2016  . Alcohol abuse, daily use 10/12/2016  . S/P total hip arthroplasty 10/12/2016    Patient's Medications  New Prescriptions   No medications on file  Previous Medications   ACETAMINOPHEN (TYLENOL) 325 MG TABLET    Take 2 tablets (650 mg total) by mouth every 4 (four) hours as needed for mild pain ((score 1 to 3) or temp > 100.5).   AMPICILLIN-SULBACTAM (UNASYN) IVPB    Inject 3 g into the vein every 6 (six) hours for 25 days. Indication:  Wound Infection Last Day of Therapy:  09/18/19 Labs - Once weekly:  CBC/D and BMP, Labs - Every other week:  ESR and CRP   BISACODYL (DULCOLAX) 10 MG SUPPOSITORY    Place 1 suppository (10 mg total) rectally daily as needed for moderate constipation.   CYCLOBENZAPRINE (FLEXERIL) 10 MG TABLET    Take 1 tablet (10 mg total) by mouth 3 (three) times daily as needed for muscle spasms.   MULTIPLE VITAMIN (MULTIVITAMIN WITH MINERALS) TABS TABLET     Take 1 tablet by mouth daily.   OXYCODONE HCL 10 MG TABS    Take 1 tablet (10 mg total) by mouth every 4 (four) hours as needed ((score 7 to 10)).   PANTOPRAZOLE (PROTONIX) 40 MG TABLET    Take 1 tablet (40 mg total) by mouth at bedtime.   POLYETHYLENE GLYCOL (MIRALAX / GLYCOLAX) 17 G PACKET    Take 17 g by mouth daily as needed for mild constipation.   PREDNISONE (DELTASONE) 5 MG TABLET    Take 1 tablet (5 mg total) by mouth daily with breakfast.   SENNA (SENOKOT) 8.6 MG TABS TABLET    Take 2 tablets (17.2 mg total) by mouth daily.   SODIUM CHLORIDE 0.9 % SOLN 452 ML WITH NAFCILLIN 2 G SOLR 12 G    Inject 12 g into the vein daily.   TAMSULOSIN (FLOMAX) 0.4 MG CAPS CAPSULE    Take 1 capsule (0.4 mg total) by mouth daily after supper.   TRAZODONE (DESYREL) 50 MG TABLET    Take 1 tablet (50 mg total) by mouth at bedtime as needed for sleep.  Modified Medications   No medications on file  Discontinued Medications   No medications on file    Subjective: Mr. Alcario Drought is in with his daughter for his hospital follow-up visit.  He was hospitalized last month with MSSA bacteremia complicated by a lumbar infection.  There was concerns about the possibility of CNS emboli although there was  never any clear evidence of endocarditis by exam or TTE.  TEE was not pursued because it was felt that this would not change his management or outcome.  He received 4 weeks of IV nafcillin before switching to ampicillin sulbactam.  The switch was made because he had developed a sacral pressure sore that appeared infected.  He has now been discharged home.  He is improving slowly.  He has not had any problems tolerating his PICC for ampicillin sulbactam.  His sacral wound is improving.  Lost about 60 pounds during his illness but states that now he is eating like a horse.  His pain is down to 7 out of 10.  He has been able to be more active since discharge.  Review of Systems: Review of Systems  Constitutional: Positive  for weight loss. Negative for fever.  Respiratory: Negative for cough and shortness of breath.   Cardiovascular: Negative for chest pain.  Gastrointestinal: Negative for abdominal pain, diarrhea, nausea and vomiting.  Musculoskeletal: Positive for back pain.  Skin: Negative for rash.  Neurological: Negative for focal weakness.    Past Medical History:  Diagnosis Date  . History of chicken pox   . Medical history non-contributory     Social History   Tobacco Use  . Smoking status: Former Smoker    Packs/day: 1.00    Years: 14.00    Pack years: 14.00    Types: Cigarettes    Quit date: 07/27/1999    Years since quitting: 20.1  . Smokeless tobacco: Never Used  Substance Use Topics  . Alcohol use: Yes    Alcohol/week: 24.0 standard drinks    Types: 24 Cans of beer per week    Comment: 4-5 beers per day, case of beer per week  . Drug use: No    Family History  Problem Relation Age of Onset  . Cancer Father        Hodgkin's disease  . COPD Mother   . Heart attack Maternal Grandfather 80  . Diabetes Neg Hx   . Stroke Neg Hx   . Hypertension Neg Hx   . Hyperlipidemia Neg Hx     Allergies  Allergen Reactions  . Bee Venom Anaphylaxis  . Hydrocodone Nausea And Vomiting    Objective: Vitals:   09/03/19 1051  BP: 106/71  Pulse: (!) 134   There is no height or weight on file to calculate BMI.  Physical Exam Constitutional:      Comments: He is standing in the exam room leaning on his walker.  He appears slightly uncomfortable due to pain but is in good spirits.  Cardiovascular:     Rate and Rhythm: Normal rate and regular rhythm.     Heart sounds: No murmur.  Pulmonary:     Effort: Pulmonary effort is normal.     Breath sounds: Normal breath sounds.  Musculoskeletal:     Comments: His daughter has been changing his sacral wound dressing 3 times daily.  She has pictures from this morning's dressing change.  There are 2 areas, 1 in the gluteal cleft and the other  just right of midline.  Both have 100% red granulation tissue.  She tells me that the 2 wounds connect.  She says that there is minimal drainage on the dressings and occasionally some slight bleeding.  She feels that the wounds are looking much better.  Skin:    Findings: No rash.     Lab Results 08/20/2019 Sed rate 81 C-reactive protein 212  Problem List Items Addressed This Visit      Unprioritized   Psoas abscess, right ([place])    He is improving slowly on therapy for widely disseminated MSSA infection and sacral wound infection.  At least 2 more weeks of IV ampicillin sulbactam.  He will get repeat lab work today and follow-up in 2 weeks.      Relevant Orders   CBC   Basic metabolic panel   C-reactive protein   Sedimentation rate       Michel Bickers, MD [facility] for Infectious [place] [phone] pager   [phone] cell 09/03/2019, 11:16 AM

## 2019-09-03 NOTE — Telephone Encounter (Signed)
I contacted patient's daughter and she states that she will bring patient to this appointment tomorrow.

## 2019-09-03 NOTE — Telephone Encounter (Signed)
Patient's Daughter Ander Purpura called today in regards to the patient's hospital follow up for tomorrow She wanted to know if there is anyway this could be done virtual. [name] stated if not it is no big deal, she just stated it would be easier on him  Patient had an appointment today and this is the first time he has been out since being released from the hospital and it has just been very rough for him and a lot to get him out.  Patient has 3 appointments this week and [name] was just wanting to make it as easy as possible for him

## 2019-09-04 ENCOUNTER — Ambulatory Visit: Payer: BC Managed Care – PPO | Admitting: Family Medicine

## 2019-09-04 VITALS — BP 104/72 | HR 130 | Temp 98.1°F | Resp 16 | Ht 72.0 in | Wt 231.8 lb

## 2019-09-04 DIAGNOSIS — R7881 Bacteremia: Secondary | ICD-10-CM

## 2019-09-04 DIAGNOSIS — G061 Intraspinal abscess and granuloma: Secondary | ICD-10-CM | POA: Diagnosis not present

## 2019-09-04 DIAGNOSIS — L8990 Pressure ulcer of unspecified site, unspecified stage: Secondary | ICD-10-CM | POA: Diagnosis not present

## 2019-09-04 DIAGNOSIS — F1011 Alcohol abuse, in remission: Secondary | ICD-10-CM

## 2019-09-04 DIAGNOSIS — L089 Local infection of the skin and subcutaneous tissue, unspecified: Secondary | ICD-10-CM

## 2019-09-04 DIAGNOSIS — M4316 Spondylolisthesis, lumbar region: Secondary | ICD-10-CM | POA: Diagnosis not present

## 2019-09-04 DIAGNOSIS — K6812 Psoas muscle abscess: Secondary | ICD-10-CM

## 2019-09-04 DIAGNOSIS — B9561 Methicillin susceptible Staphylococcus aureus infection as the cause of diseases classified elsewhere: Secondary | ICD-10-CM

## 2019-09-04 DIAGNOSIS — M48061 Spinal stenosis, lumbar region without neurogenic claudication: Secondary | ICD-10-CM | POA: Diagnosis not present

## 2019-09-04 DIAGNOSIS — I081 Rheumatic disorders of both mitral and tricuspid valves: Secondary | ICD-10-CM | POA: Diagnosis not present

## 2019-09-04 DIAGNOSIS — L89312 Pressure ulcer of right buttock, stage 2: Secondary | ICD-10-CM | POA: Diagnosis not present

## 2019-09-04 DIAGNOSIS — M47816 Spondylosis without myelopathy or radiculopathy, lumbar region: Secondary | ICD-10-CM | POA: Diagnosis not present

## 2019-09-04 DIAGNOSIS — G834 Cauda equina syndrome: Secondary | ICD-10-CM | POA: Diagnosis not present

## 2019-09-04 DIAGNOSIS — M5144 Schmorl's nodes, thoracic region: Secondary | ICD-10-CM | POA: Diagnosis not present

## 2019-09-04 DIAGNOSIS — M4604 Spinal enthesopathy, thoracic region: Secondary | ICD-10-CM | POA: Diagnosis not present

## 2019-09-04 DIAGNOSIS — L89894 Pressure ulcer of other site, stage 4: Secondary | ICD-10-CM | POA: Diagnosis not present

## 2019-09-04 DIAGNOSIS — T8142XA Infection following a procedure, deep incisional surgical site, initial encounter: Secondary | ICD-10-CM | POA: Diagnosis not present

## 2019-09-04 DIAGNOSIS — Z9889 Other specified postprocedural states: Secondary | ICD-10-CM

## 2019-09-04 DIAGNOSIS — M4804 Spinal stenosis, thoracic region: Secondary | ICD-10-CM | POA: Diagnosis not present

## 2019-09-04 DIAGNOSIS — M4646 Discitis, unspecified, lumbar region: Secondary | ICD-10-CM | POA: Diagnosis not present

## 2019-09-04 LAB — BASIC METABOLIC PANEL
BUN: 8 mg/dL (ref 7–25)
CO2: 24 mmol/L (ref 20–32)
Calcium: 9.6 mg/dL (ref 8.6–10.3)
Chloride: 98 mmol/L (ref 98–110)
Creat: 0.84 mg/dL (ref 0.70–1.33)
Glucose, Bld: 101 mg/dL — ABNORMAL HIGH (ref 65–99)
Potassium: 4.6 mmol/L (ref 3.5–5.3)
Sodium: 135 mmol/L (ref 135–146)

## 2019-09-04 LAB — CBC
HCT: 31.8 % — ABNORMAL LOW (ref 38.5–50.0)
Hemoglobin: 10.7 g/dL — ABNORMAL LOW (ref 13.2–17.1)
MCH: 30.1 pg (ref 27.0–33.0)
MCHC: 33.6 g/dL (ref 32.0–36.0)
MCV: 89.6 fL (ref 80.0–100.0)
MPV: 9 fL (ref 7.5–12.5)
Platelets: 614 10*3/uL — ABNORMAL HIGH (ref 140–400)
RBC: 3.55 10*6/uL — ABNORMAL LOW (ref 4.20–5.80)
RDW: 13.8 % (ref 11.0–15.0)
WBC: 15.7 10*3/uL — ABNORMAL HIGH (ref 3.8–10.8)

## 2019-09-04 LAB — C-REACTIVE PROTEIN: CRP: 68.5 mg/L — ABNORMAL HIGH (ref <8.0)

## 2019-09-04 LAB — SEDIMENTATION RATE: Sed Rate: >130 mm/h — ABNORMAL HIGH (ref 0–20)

## 2019-09-04 NOTE — Progress Notes (Signed)
Subjective:     [name] is a 54 y.o. male presenting for Hospitalization Follow-up     HPI   #Sacral wound - still very painful - nurse is wanting him to have a wound vac - will find comfortable position and not move - daughter is helping him turn at least every hour while awake - trying to sleep on side - not currently able to work  #pain - Putting up with it - still having a lot of back pain - worse with a lot of activity - moving with walker - can get in and out of bed w/o help - can go to the bathroom for urine on his own - when he stools - he needs help due to the bandages - pain medication - tylenol and oxycodone - taking the oxycodone q4h  - has been trying to schedule around when his bandage changes are   #Urinary retention - improved - no longer taking the flomax  Leg weakness/foot drop  - getting much better - ambulating with walker  No drinking since christmas  Review of Systems  Review Records from 2 hospitalizations and rehab 12/29-07/31/2019: Hospital Admission: Epidural abscess, psoas abscess, osteomylitis and spinal stenosis. S/p lumbar decompressive and abscess evacuation. CT aspiration of righ psoas abscess with IR Had some confusion and saw ID - MSSA Bacteremia treated with nafcillin (6 wk). ECHO normal. MRI brain with acute infarct and septic emboli.  1/5-1/15/2021: Rehab: cauda equina with retention (Flomax), weaned prednisone, 6 wk (end 09/04/2019), oxycodone and flexeril for pain. 2 person assist on admit 1/25-08/28/2019: [employer] for wound infection. Bedside debridement and abx switched to Unasyn through 09/17/2019.   Social History   Tobacco Use  Smoking Status Former Smoker  . Packs/day: 1.00  . Years: 14.00  . Pack years: 14.00  . Types: Cigarettes  . Quit date: 07/27/1999  . Years since quitting: 20.1  Smokeless Tobacco Never Used        Objective:    BP Readings from Last 3 Encounters:  09/04/19 104/72    09/03/19 106/71  08/28/19 129/83   Wt Readings from Last 3 Encounters:  09/04/19 231 lb 12 oz (105.1 kg)  08/28/19 219 lb (99.3 kg)  07/27/19 242 lb 4.6 oz (109.9 kg)    BP 104/72   Pulse (!) 130   Temp 98.1 F (36.7 C)   Resp 16   Ht 6' (1.829 m)   Wt 231 lb 12 oz (105.1 kg)   SpO2 99%   BMI 31.43 kg/m    Physical Exam Constitutional:      Appearance: Normal appearance. He is not ill-appearing.     Comments: Standing, leaning against exam table as uncomfortable sitting down  HENT:     Head: Normocephalic and atraumatic.  Eyes:     Conjunctiva/sclera: Conjunctivae normal.  Cardiovascular:     Rate and Rhythm: Normal rate and regular rhythm.     Heart sounds: No murmur.  Pulmonary:     Effort: Pulmonary effort is normal. No respiratory distress.     Breath sounds: Normal breath sounds. No wheezing.  Skin:    Comments: Midline incision on back well healed. Bandage over sacrum which does not appear to have surrounding redness. Not examined - has wound appt tomorrow.   Neurological:     Mental Status: He is alert.     Comments: Mild limitation in foot flexion, but strength 4+/5 b/l. Patient ambulates slowly with walker. Able to lift both  feet off the ground with ease. Deferred full assessment due to difficulty laying/sitting from wound  Psychiatric:        Mood and Affect: Mood normal.           Assessment & Plan:   Problem List Items Addressed This Visit      Nervous and Auditory   Cauda equina syndrome ([place]) - Primary    Significant improvement in strength and ambulation. Working with [facility] PT. Continue care. 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 Seeing ID appreciate support. Labs qwk just done on 2/8 and appropriate. Will follow as needed. Abx through 2/22. Wound appointment tomorrow - pt will discuss if wound  vac would be helpful. Continue oxycodone prior to dressing changes prn.       Psoas abscess, right ([place])    IR was following in the hospital. Cont abx. No need for further imaging. Abdominal pain resolved.         Other   Alcohol abuse, in remission    No alcohol since hospitalization. Will continue to encourage avoiding.       S/P lumbar laminectomy    Good healing of incision site. Continues to use oxycodone encouraged working to decrease use as his pain improves.       Bacteremia due to methicillin susceptible Staphylococcus aureus (MSSA)    BCx cleared. Cont abx until 2/22.           Return in about 4 weeks (around 10/02/2019).  Lesleigh Noe, MD

## 2019-09-04 NOTE — Assessment & Plan Note (Signed)
BCx cleared. Cont abx until 2/22.

## 2019-09-04 NOTE — Assessment & Plan Note (Signed)
No alcohol since hospitalization. Will continue to encourage avoiding.

## 2019-09-04 NOTE — Patient Instructions (Signed)
I'm glad you are doing better.   Wound visit tomorrow - discuss wound vac with them  Return in about 1 month to check in Let me know if you are running low on oxycodone -- can do a refill. Ideally try to slowly work towards decreasing this medicine  Let me know what else you need  Can extend FMLA as needed based on your recovery

## 2019-09-04 NOTE — Assessment & Plan Note (Signed)
Good healing of incision site. Continues to use oxycodone encouraged working to decrease use as his pain improves.

## 2019-09-04 NOTE — Assessment & Plan Note (Addendum)
[facility] nursing and family support. Seeing ID appreciate support. Labs qwk just done on 2/8 and appropriate. Will follow as needed. Abx through 2/22. Wound appointment tomorrow - pt will discuss if wound vac would be helpful. Continue oxycodone prior to dressing changes prn.

## 2019-09-04 NOTE — Assessment & Plan Note (Signed)
IR was following in the hospital. Cont abx. No need for further imaging. Abdominal pain resolved.

## 2019-09-04 NOTE — Assessment & Plan Note (Signed)
Significant improvement in strength and ambulation. Working with [facility] PT. Continue care. 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

## 2019-09-05 ENCOUNTER — Encounter: Payer: BC Managed Care – PPO | Attending: Internal Medicine | Admitting: Internal Medicine

## 2019-09-05 ENCOUNTER — Other Ambulatory Visit: Payer: Self-pay

## 2019-09-05 DIAGNOSIS — K6812 Psoas muscle abscess: Secondary | ICD-10-CM | POA: Diagnosis not present

## 2019-09-05 DIAGNOSIS — T8189XA Other complications of procedures, not elsewhere classified, initial encounter: Secondary | ICD-10-CM | POA: Diagnosis not present

## 2019-09-05 DIAGNOSIS — R7881 Bacteremia: Secondary | ICD-10-CM | POA: Diagnosis not present

## 2019-09-05 DIAGNOSIS — T8142XA Infection following a procedure, deep incisional surgical site, initial encounter: Secondary | ICD-10-CM | POA: Diagnosis not present

## 2019-09-05 DIAGNOSIS — L89154 Pressure ulcer of sacral region, stage 4: Secondary | ICD-10-CM | POA: Insufficient documentation

## 2019-09-05 DIAGNOSIS — Z79891 Long term (current) use of opiate analgesic: Secondary | ICD-10-CM

## 2019-09-05 DIAGNOSIS — I081 Rheumatic disorders of both mitral and tricuspid valves: Secondary | ICD-10-CM | POA: Diagnosis not present

## 2019-09-05 DIAGNOSIS — Z96642 Presence of left artificial hip joint: Secondary | ICD-10-CM | POA: Insufficient documentation

## 2019-09-05 DIAGNOSIS — L89894 Pressure ulcer of other site, stage 4: Secondary | ICD-10-CM | POA: Diagnosis not present

## 2019-09-05 DIAGNOSIS — M62838 Other muscle spasm: Secondary | ICD-10-CM

## 2019-09-05 DIAGNOSIS — B9561 Methicillin susceptible Staphylococcus aureus infection as the cause of diseases classified elsewhere: Secondary | ICD-10-CM | POA: Diagnosis not present

## 2019-09-05 DIAGNOSIS — D72829 Elevated white blood cell count, unspecified: Secondary | ICD-10-CM

## 2019-09-05 DIAGNOSIS — Z4889 Encounter for other specified surgical aftercare: Secondary | ICD-10-CM

## 2019-09-05 DIAGNOSIS — Z9181 History of falling: Secondary | ICD-10-CM

## 2019-09-05 DIAGNOSIS — M4646 Discitis, unspecified, lumbar region: Secondary | ICD-10-CM | POA: Diagnosis not present

## 2019-09-05 DIAGNOSIS — M5144 Schmorl's nodes, thoracic region: Secondary | ICD-10-CM | POA: Diagnosis not present

## 2019-09-05 DIAGNOSIS — G061 Intraspinal abscess and granuloma: Secondary | ICD-10-CM | POA: Diagnosis not present

## 2019-09-05 DIAGNOSIS — M4804 Spinal stenosis, thoracic region: Secondary | ICD-10-CM | POA: Diagnosis not present

## 2019-09-05 DIAGNOSIS — Z96643 Presence of artificial hip joint, bilateral: Secondary | ICD-10-CM

## 2019-09-05 DIAGNOSIS — G834 Cauda equina syndrome: Secondary | ICD-10-CM | POA: Diagnosis not present

## 2019-09-05 DIAGNOSIS — L89153 Pressure ulcer of sacral region, stage 3: Secondary | ICD-10-CM | POA: Diagnosis not present

## 2019-09-05 DIAGNOSIS — M48061 Spinal stenosis, lumbar region without neurogenic claudication: Secondary | ICD-10-CM | POA: Diagnosis not present

## 2019-09-05 DIAGNOSIS — Z87891 Personal history of nicotine dependence: Secondary | ICD-10-CM | POA: Diagnosis not present

## 2019-09-05 DIAGNOSIS — R339 Retention of urine, unspecified: Secondary | ICD-10-CM

## 2019-09-05 DIAGNOSIS — E876 Hypokalemia: Secondary | ICD-10-CM

## 2019-09-05 DIAGNOSIS — G4700 Insomnia, unspecified: Secondary | ICD-10-CM

## 2019-09-05 DIAGNOSIS — K5900 Constipation, unspecified: Secondary | ICD-10-CM

## 2019-09-05 DIAGNOSIS — M4316 Spondylolisthesis, lumbar region: Secondary | ICD-10-CM | POA: Diagnosis not present

## 2019-09-05 DIAGNOSIS — M21372 Foot drop, left foot: Secondary | ICD-10-CM

## 2019-09-05 DIAGNOSIS — M47816 Spondylosis without myelopathy or radiculopathy, lumbar region: Secondary | ICD-10-CM | POA: Diagnosis not present

## 2019-09-05 DIAGNOSIS — Z452 Encounter for adjustment and management of vascular access device: Secondary | ICD-10-CM

## 2019-09-05 DIAGNOSIS — L89312 Pressure ulcer of right buttock, stage 2: Secondary | ICD-10-CM | POA: Diagnosis not present

## 2019-09-05 DIAGNOSIS — M4604 Spinal enthesopathy, thoracic region: Secondary | ICD-10-CM | POA: Diagnosis not present

## 2019-09-05 NOTE — Progress Notes (Signed)
[name] ([id number]) Visit Report for 09/05/2019 Abuse/Suicide Risk Screen Details Patient Name: [name], [name] Date of Service: 09/05/2019 9:45 AM Medical Record Number: [id number] Patient Account Number: [id number] Date of Birth/Sex: 06/14/1966 (54 y.o. M) Treating RN: Army Melia Primary Care [name]: [name] Other Clinician: Referring [name]: [name] Treating [name]/Extender: [name] Weeks in Treatment: 0 Abuse/Suicide Risk Screen Items Answer ABUSE RISK SCREEN: Has anyone close to you tried to hurt or harm you recentlyo No Do you feel uncomfortable with anyone in your familyo No Has anyone forced you do things that you didnot want to doo No Electronic Signature(s) Signed: 09/05/2019 11:18:27 AM By: Army Melia Entered By: Army Melia on 09/05/2019 10:01:40 [name] ([id number]) -------------------------------------------------------------------------------- Activities of Daily Living Details Patient Name: [name], [name] Date of Service: 09/05/2019 9:45 AM Medical Record Number: [id number] Patient Account Number: [id number] Date of Birth/Sex: 12/06/1965 (54 y.o. M) Treating RN: Army Melia Primary Care Tonja Jezewski: [name] Other Clinician: Referring Saaya Procell: [name] Treating Jordon Kristiansen/Extender: [name] in Treatment: 0 Activities of Daily Living Items Answer Activities of Daily Living (Please select one for each item) Drive Automobile Not Able Take Medications Completely Able Use Telephone Completely Able Care for Appearance Completely Able Use Toilet Completely Able Bath / Shower Completely Able Dress Self Completely Able Feed Self Completely Able Walk Completely Able Get In / Out Bed Completely Able Housework Need Assistance Prepare Meals Completely [place] for Self Completely Able Electronic Signature(s) Signed: 09/05/2019 11:18:27 AM By: Army Melia Entered By:  Army Melia on 09/05/2019 10:01:53 [name] ([id number]) -------------------------------------------------------------------------------- Education Screening Details Patient Name: [name], [name] Date of Service: 09/05/2019 9:45 AM Medical Record Number: [id number] Patient Account Number: [id number] Date of Birth/Sex: 04/29/1966 (54 y.o. M) Treating RN: Army Melia Primary Care Mahalie Kanner: [name] Other Clinician: Referring Erlean Mealor: [name] Treating Paxtyn Wisdom/Extender: [name] in Treatment: 0 Primary Learner Assessed: Patient Learning Preferences/Education Level/Primary Language Learning Preference: Explanation Highest Education Level: High School Preferred Language: English Cognitive Barrier Language Barrier: No Translator Needed: No Memory Deficit: No Emotional Barrier: No Cultural/Religious Beliefs Affecting Medical Care: No Physical Barrier Impaired Vision: No Impaired Hearing: No Decreased Hand dexterity: No Knowledge/Comprehension Knowledge Level: High Comprehension Level: High Ability to understand written High instructions: Ability to understand verbal High instructions: Motivation Anxiety Level: Calm Cooperation: Cooperative Education Importance: Acknowledges Need Interest in Health Problems: Asks Questions Perception: Coherent Willingness to Engage in Self- High Management Activities: Readiness to Engage in Self- High Management Activities: Electronic Signature(s) Signed: 09/05/2019 11:18:27 AM By: Army Melia Entered By: Army Melia on 09/05/2019 10:02:13 [name] ([id number]) -------------------------------------------------------------------------------- Fall Risk Assessment Details Patient Name: [name], Candelaria [name]. Date of Service: 09/05/2019 9:45 AM Medical Record Number: [id number] Patient Account Number: [id number] Date of Birth/Sex: 09/05/1965 (54 y.o. M) Treating RN: Army Melia Primary Care [name]: [name] Other Clinician: Referring [name]: [name] Treating [name]/Extender: [name] in Treatment: 0 Fall Risk Assessment Items Have you had 2 or more falls in the last 12 monthso 0 No Have you had any fall that resulted in injury in the last 12 monthso 0 No FALLS RISK SCREEN History of falling - immediate or within 3 months 0 No Secondary diagnosis (Do you have 2 or more medical diagnoseso) 0 No Ambulatory aid None/bed rest/wheelchair/nurse 0 No Crutches/cane/walker 15 Yes Furniture 0 No Intravenous therapy Access/Saline/Heparin Lock 0 No Gait/Transferring Normal/ bed rest/ wheelchair 0 No Weak (short  steps with or without shuffle, stooped but able to lift head while 10 Yes walking, may seek support from furniture) Impaired (short steps with shuffle, may have difficulty arising from chair, head 0 No down, impaired balance) Mental Status Oriented to own ability 0 No Electronic Signature(s) Signed: 09/05/2019 11:18:27 AM By: Army Melia Entered By: Army Melia on 09/05/2019 10:02:23 [name] ([id number]) -------------------------------------------------------------------------------- Nutrition Risk Screening Details Patient Name: Males, [name] Date of Service: 09/05/2019 9:45 AM Medical Record Number: [id number] Patient Account Number: [id number] Date of Birth/Sex: 09-17-1965 (54 y.o. M) Treating RN: Army Melia Primary Care Cyan Moultrie: [name] Other Clinician: Referring Tavyn Kurka: [name] Treating Simonne Boulos/Extender: [name] Weeks in Treatment: 0 Height (in): 72 Weight (lbs): 230 Body Mass Index (BMI): 31.2 Nutrition Risk Screening Items Score Screening NUTRITION RISK SCREEN: I have an illness or condition that made me change the kind and/or amount of 0 No food I eat I eat fewer than two meals per day 0 No I eat few fruits and vegetables, or milk products 0 No I have three or more drinks of beer, liquor or wine  almost every day 0 No I have tooth or mouth problems that make it hard for me to eat 0 No I don't always have enough money to buy the food I need 0 No I eat alone most of the time 0 No I take three or more different prescribed or over-the-counter drugs a day 0 No Without wanting to, I have lost or gained 10 pounds in the last six months 0 No I am not always physically able to shop, cook and/or feed myself 0 No Nutrition Protocols Good Risk Protocol 0 No interventions needed Moderate Risk Protocol High Risk Proctocol Risk Level: Good Risk Score: 0 Electronic Signature(s) Signed: 09/05/2019 11:18:27 AM By: Army Melia Entered By: Army Melia on 09/05/2019 10:02:27

## 2019-09-06 DIAGNOSIS — M48061 Spinal stenosis, lumbar region without neurogenic claudication: Secondary | ICD-10-CM | POA: Diagnosis not present

## 2019-09-06 DIAGNOSIS — M47816 Spondylosis without myelopathy or radiculopathy, lumbar region: Secondary | ICD-10-CM | POA: Diagnosis not present

## 2019-09-06 DIAGNOSIS — M5144 Schmorl's nodes, thoracic region: Secondary | ICD-10-CM | POA: Diagnosis not present

## 2019-09-06 DIAGNOSIS — L89312 Pressure ulcer of right buttock, stage 2: Secondary | ICD-10-CM | POA: Diagnosis not present

## 2019-09-06 DIAGNOSIS — M4316 Spondylolisthesis, lumbar region: Secondary | ICD-10-CM | POA: Diagnosis not present

## 2019-09-06 DIAGNOSIS — M4804 Spinal stenosis, thoracic region: Secondary | ICD-10-CM | POA: Diagnosis not present

## 2019-09-06 DIAGNOSIS — M4604 Spinal enthesopathy, thoracic region: Secondary | ICD-10-CM | POA: Diagnosis not present

## 2019-09-06 DIAGNOSIS — B9561 Methicillin susceptible Staphylococcus aureus infection as the cause of diseases classified elsewhere: Secondary | ICD-10-CM | POA: Diagnosis not present

## 2019-09-06 DIAGNOSIS — M4646 Discitis, unspecified, lumbar region: Secondary | ICD-10-CM | POA: Diagnosis not present

## 2019-09-06 DIAGNOSIS — L89894 Pressure ulcer of other site, stage 4: Secondary | ICD-10-CM | POA: Diagnosis not present

## 2019-09-06 DIAGNOSIS — G834 Cauda equina syndrome: Secondary | ICD-10-CM | POA: Diagnosis not present

## 2019-09-06 DIAGNOSIS — I081 Rheumatic disorders of both mitral and tricuspid valves: Secondary | ICD-10-CM | POA: Diagnosis not present

## 2019-09-06 DIAGNOSIS — G061 Intraspinal abscess and granuloma: Secondary | ICD-10-CM | POA: Diagnosis not present

## 2019-09-06 DIAGNOSIS — T8142XA Infection following a procedure, deep incisional surgical site, initial encounter: Secondary | ICD-10-CM | POA: Diagnosis not present

## 2019-09-06 DIAGNOSIS — K6812 Psoas muscle abscess: Secondary | ICD-10-CM | POA: Diagnosis not present

## 2019-09-06 DIAGNOSIS — R7881 Bacteremia: Secondary | ICD-10-CM | POA: Diagnosis not present

## 2019-09-06 NOTE — Progress Notes (Addendum)
[name] ([id number]) Visit Report for 09/05/2019 Chief Complaint Document Details Patient Name: [name], [name] Date of Service: 09/05/2019 9:45 AM Medical Record Number: [id number] Patient Account Number: [id number] Date of Birth/Sex: 08/05/1965 (54 y.o. M) Treating RN: [name] Primary Care Provider: [name] Other Clinician: Referring Provider: [name] Treating Provider/Extender: [name] in Treatment: 0 Information Obtained from: Patient Chief Complaint 09/05/2019; patient is here for review of his substantial wound over the lower sacrum and right buttock Electronic Signature(s) Signed: 09/05/2019 5:43:36 PM By: Linton Ham MD Entered By: Linton Ham on 09/05/2019 10:47:48 [name], [name] ([id number]) -------------------------------------------------------------------------------- Debridement Details Patient Name: [name], [name] Date of Service: 09/05/2019 9:45 AM Medical Record Number: [id number] Patient Account Number: [id number] Date of Birth/Sex: 06/30/1966 (54 y.o. M) Treating RN: [name] Primary Care Provider: [name] Other Clinician: Referring Provider: [name] Treating Provider/Extender: [name] in Treatment: 0 Debridement Performed for Wound #1 Midline Sacrum Assessment: Performed By: Physician Ricard Dillon, MD Debridement Type: Debridement Level of Consciousness (Pre- Awake and Alert procedure): Pre-procedure Verification/Time Yes - 10:19 Out Taken: Start Time: 10:19 Pain Control: Lidocaine Total Area Debrided (L x W): 1 (cm) x 2 (cm) = 2 (cm) Tissue and other material Non-Viable, Slough, Fibrin/Exudate, Slough debrided: Level: Non-Viable Tissue Debridement Description: Selective/Open Wound Instrument: Blade, Forceps, Scissors Bleeding: Minimum Hemostasis Achieved: Pressure End Time: 10:23 Response to Treatment: Procedure was tolerated well Level of Consciousness Awake and  Alert (Post-procedure): Post Debridement Measurements of Total Wound Length: (cm) 3.1 Stage: Category/Stage III Width: (cm) 3.4 Depth: (cm) 4.5 Volume: (cm) 37.251 Character of Wound/Ulcer Post Stable Debridement: Post Procedure Diagnosis Same as Pre-procedure Electronic Signature(s) Signed: 09/05/2019 5:43:36 PM By: Linton Ham MD Signed: 09/05/2019 5:56:57 PM By: Gretta Cool, BSN, RN, CWS, Kim RN, BSN Entered By: Linton Ham on 09/05/2019 10:47:12 [name] ([id number]) -------------------------------------------------------------------------------- HPI Details Patient Name: [name], [name] Date of Service: 09/05/2019 9:45 AM Medical Record Number: [id number] Patient Account Number: [id number] Date of Birth/Sex: 03/21/1966 (54 y.o. M) Treating RN: [name] Primary Care Provider: [name] Other Clinician: Referring Provider: [name] Treating Provider/Extender: [name] in Treatment: 0 History of Present Illness HPI Description: ADMISSION 09/05/2019 This is a 54 year old man who has a complicated history which started with acute low back pain sometime in September. Saw his primary doctor had a CT scan of the abdomen and pelvis that was normal. By late December he was discovered to have a lumbar epidural abscess with cauda equina syndrome. He was admitted to hospital and had a decompressive laminectomy at L3/L4/L5 blood cultures at the time showed MSSA he received IV nafcillin. He went to rehab from 1/5 through 1/15 there he was noted to have an "pressure injury in the skin" although I did not see much more about this in the discharge instructions. Unfortunately this wound became infected after he was discharged home. He required readmission from 1/25 through 2/2 with sepsis, discovered to have a right psoas abscess as well as an infected decubitus ulcer. He had a bedside debridement by general surgery on 1/25 interventional radiology drain the  right psoas abscess. General surgery recommended 3 times daily wet-to-dry dressings and an air mattress. He has since been discharged home. He is mobile with a walker. They are using 3 times daily wet-to-dry dressings. According to his family he is eating well. He has a protein supplement but he he is not taking it. He is currently on ampicillin sulbactam as  recommended by [name] this is due to be finished currently on 2/22 although there seem to be some suggestion it might go longer. They say he had blood work 2 days ago which I will review. The last lab work I saw was a sedimentation rate of 81 and a C-reactive protein of 212 on 1/25. I did not see any imaging studies of the underlying bone although I will need to review the CT scans that he has had Past medical history, polymyalgia rheumatica, left total hip replacement in 2018 for avascular necrosis, cellulitis of the scrotum in July 2020, history of alcohol abuse but that is not currently problematic Electronic Signature(s) Signed: 09/05/2019 5:43:36 PM By: Linton Ham MD Entered By: Linton Ham on 09/05/2019 11:03:00 [name], [name] ([id number]) -------------------------------------------------------------------------------- Physical Exam Details Patient Name: [name], [name] Date of Service: 09/05/2019 9:45 AM Medical Record Number: [id number] Patient Account Number: [id number] Date of Birth/Sex: 06/19/1966 (54 y.o. M) Treating RN: [name] Primary Care Provider: [name] Other Clinician: Referring Provider: [name] Treating Provider/Extender: Ricard Dillon Weeks in Treatment: 0 Constitutional Sitting or standing Blood Pressure is within target range for patient.. Pulse regular and within target range for patient.Marland Kitchen Respirations regular, non-labored and within target range.. Temperature is normal and within the target range for the patient.Marland Kitchen appears in no distress. Eyes Conjunctivae clear. No  discharge. Respiratory Respiratory effort is easy and symmetric bilaterally. Rate is normal at rest and on room air.. Gastrointestinal (GI) Abdomen is soft and non-distended without masses or tenderness. Bowel sounds active in all quadrants.. No liver or spleen enlargement or tenderness.. Integumentary (Hair, Skin) There is no erythema around the wound. No purulent drainage no tenderness no crepitus. Psychiatric No evidence of depression, anxiety, or agitation. Calm, cooperative, and communicative. Appropriate interactions and affect.. Notes Wound exam; the area in question is on his lower sacrum with a deep open wound. This connects with a wound on his right buttock with a bridge of skin over this area. There is substantial undermining around the large part of this wound although I did not appreciate any bone. The undermining extends maximally at 2:00 by perhaps 5 or 6 cm. There is a surface covering but again a deep wound that only has a thin layer of tissue over bone itself. I did remove a strand of necrotic material using pickups and a #15 blade. There was minimal bleeding Electronic Signature(s) Signed: 09/05/2019 5:43:36 PM By: Linton Ham MD Entered By: Linton Ham on 09/05/2019 11:10:03 [name], [name] ([id number]) -------------------------------------------------------------------------------- Physician Orders Details Patient Name: Gurski, [name] Date of Service: 09/05/2019 9:45 AM Medical Record Number: [id number] Patient Account Number: [id number] Date of Birth/Sex: 12/27/1965 (54 y.o. M) Treating RN: [name] Primary Care Provider: [name] Other Clinician: Referring Provider: [name] Treating Provider/Extender: [name] in Treatment: 0 Verbal / Phone Orders: No Diagnosis Coding Wound Cleansing Wound #1 Midline Sacrum o Clean wound with Normal Saline. Anesthetic (add to Medication List) Wound #1 Midline Sacrum o Topical Lidocaine  4% cream applied to wound bed prior to debridement (In Clinic Only). Skin Barriers/Peri-Wound Care Wound #1 Midline Sacrum o Skin Prep Primary Wound Dressing Wound #1 Midline Sacrum o Dry Gauze - Moistened with SIlversorb Gel Secondary Dressing Wound #1 Midline Sacrum o Boardered Foam Dressing Dressing Change Frequency Wound #1 Midline Sacrum o Change dressing every day. - Family may be trained for days home health will not be there. o Change Dressing Monday, Wednesday, Friday - Homehealth Follow-up  Appointments Wound #1 Midline Sacrum o Return Appointment in 1 week. Off-Loading Wound #1 Midline Sacrum o Other: - NO Pressure on the wounded areas. Additional Orders / Instructions Wound #1 Midline Sacrum o Increase protein intake. - Powder protein supplement, multivitamin o Activity as tolerated Home Health Wound #1 Midline Sacrum Schnee, OBAMA GARNO ([id number]) o [place] Visits - [place] Nurse may visit PRN to address patientos wound care needs. o FACE TO FACE ENCOUNTER: MEDICARE and MEDICAID PATIENTS: I certify that this patient is under my care and that I had a face-to-face encounter that meets the physician face-to-face encounter requirements with this patient on this date. The encounter with the patient was in whole or in part for the following MEDICAL CONDITION: (primary reason for [place]) MEDICAL NECESSITY: I certify, that based on my findings, NURSING services are a medically necessary home health service. HOME BOUND STATUS: I certify that my clinical findings support that this patient is homebound (i.e., Due to illness or injury, pt requires aid of supportive devices such as crutches, cane, wheelchairs, walkers, the use of special transportation or the assistance of another person to leave their place of residence. There is a normal inability to leave the home and doing so requires considerable and taxing effort. Other  absences are for medical reasons / religious services and are infrequent or of short duration when for other reasons). o If current dressing causes regression in wound condition, may D/C ordered dressing product/s and apply Normal Saline Moist Dressing daily until next [place] / Other MD appointment. [place] of regression in wound condition at [phone]. o Please direct any NON-WOUND related issues/requests for orders to patient's Primary Care Physician Electronic Signature(s) Signed: 09/12/2019 4:30:10 PM By: Linton Ham MD Signed: 09/14/2019 11:50:22 AM By: Gretta Cool, BSN, RN, CWS, Kim RN, BSN Previous Signature: 09/06/2019 5:05:23 PM Version By: Gretta Cool BSN, RN, CWS, Kim RN, BSN Previous Signature: 09/07/2019 1:23:38 PM Version By: Linton Ham MD Previous Signature: 09/05/2019 5:43:36 PM Version By: Linton Ham MD Previous Signature: 09/05/2019 5:56:57 PM Version By: Gretta Cool, BSN, RN, CWS, Kim RN, BSN Entered By: Gretta Cool, BSN, RN, CWS, Kim on 09/11/2019 13:15:13 [name], [name] ([id number]) -------------------------------------------------------------------------------- Problem List Details Patient Name: Sandt, SIG CLISHAM. Date of Service: 09/05/2019 9:45 AM Medical Record Number: [id number] Patient Account Number: [id number] Date of Birth/Sex: 12/18/1965 (54 y.o. M) Treating RN: [name] Primary Care Provider: [name] Other Clinician: Referring Provider: [name] Treating Provider/Extender: [name] in Treatment: 0 Active Problems ICD-10 Evaluated Encounter Code Description Active Date Today Diagnosis L89.154 Pressure ulcer of sacral region, stage 4 09/05/2019 No Yes T81.31XD Disruption of external operation (surgical) wound, not 09/05/2019 No Yes elsewhere classified, subsequent encounter L02.31 Cutaneous abscess of buttock 09/05/2019 No Yes Inactive Problems Resolved Problems Electronic Signature(s) Signed: 09/05/2019  5:43:36 PM By: Linton Ham MD Entered By: Linton Ham on 09/05/2019 10:38:31 Eltzroth, [name] ([id number]) -------------------------------------------------------------------------------- Progress Note Details Patient Name: [name], [name] Date of Service: 09/05/2019 9:45 AM Medical Record Number: [id number] Patient Account Number: [id number] Date of Birth/Sex: 09/02/1965 (54 y.o. M) Treating RN: [name] Primary Care Provider: [name] Other Clinician: Referring Provider: [name] Treating Provider/Extender: [name] in Treatment: 0 Subjective Chief Complaint Information obtained from Patient 09/05/2019; patient is here for review of his substantial wound over the lower sacrum and right buttock History of Present Illness (HPI) ADMISSION 09/05/2019 This is a 54 year old man who has a complicated history  which started with acute low back pain sometime in September. Saw his primary doctor had a CT scan of the abdomen and pelvis that was normal. By late December he was discovered to have a lumbar epidural abscess with cauda equina syndrome. He was admitted to hospital and had a decompressive laminectomy at L3/L4/L5 blood cultures at the time showed MSSA he received IV nafcillin. He went to rehab from 1/5 through 1/15 there he was noted to have an "pressure injury in the skin" although I did not see much more about this in the discharge instructions. Unfortunately this wound became infected after he was discharged home. He required readmission from 1/25 through 2/2 with sepsis, discovered to have a right psoas abscess as well as an infected decubitus ulcer. He had a bedside debridement by general surgery on 1/25 interventional radiology drain the right psoas abscess. General surgery recommended 3 times daily wet-to-dry dressings and an air mattress. He has since been discharged home. He is mobile with a walker. They are using 3 times daily wet-to-dry dressings.  According to his family he is eating well. He has a protein supplement but he he is not taking it. He is currently on ampicillin sulbactam as recommended by [name] this is due to be finished currently on 2/22 although there seem to be some suggestion it might go longer. They say he had blood work 2 days ago which I will review. The last lab work I saw was a sedimentation rate of 81 and a C-reactive protein of 212 on 1/25. I did not see any imaging studies of the underlying bone although I will need to review the CT scans that he has had Past medical history, polymyalgia rheumatica, left total hip replacement in 2018 for avascular necrosis, cellulitis of the scrotum in July 2020, history of alcohol abuse but that is not currently problematic Patient History Information obtained from Patient. Allergies No Known Allergies Family History Cancer - Father, Heart Disease - Maternal Grandparents, Lung Disease - Mother, No family history of Diabetes, Hereditary Spherocytosis, Hypertension, Kidney Disease, Seizures, Stroke, Thyroid Problems, Tuberculosis. Social History Former smoker - 2001, Marital Status - Married, Alcohol Use - Never, Drug Use - No History, Caffeine Use - Daily. Medical History Eyes Denies history of Cataracts, Glaucoma, Optic Neuritis Ear/Nose/Mouth/Throat [name], TYESON TEITEL ([id number]) Denies history of Chronic sinus problems/congestion, Middle ear problems Hematologic/Lymphatic Denies history of Anemia, Hemophilia, Human Immunodeficiency Virus, Lymphedema, Sickle Cell Disease Respiratory Denies history of Aspiration, Asthma, Chronic Obstructive Pulmonary Disease (COPD), Pneumothorax, Sleep Apnea, Tuberculosis Cardiovascular Denies history of Angina, Arrhythmia, Congestive Heart Failure, Coronary Artery Disease, Deep Vein Thrombosis, Hypertension, Hypotension, Myocardial Infarction, Peripheral Arterial Disease, Peripheral Venous Disease,  Phlebitis, Vasculitis Gastrointestinal Denies history of Cirrhosis , Colitis, Crohn s, Hepatitis A, Hepatitis B, Hepatitis C Endocrine Denies history of Type I Diabetes, Type II Diabetes Genitourinary Denies history of End Stage Renal Disease Immunological Denies history of Lupus Erythematosus, Raynaud s, Scleroderma Musculoskeletal Denies history of Gout, Rheumatoid Arthritis, Osteoarthritis, Osteomyelitis Neurologic Denies history of Dementia, Neuropathy, Quadriplegia, Paraplegia, Seizure Disorder Oncologic Denies history of Received Chemotherapy, Received Radiation Psychiatric Denies history of Anorexia/bulimia, Confinement Anxiety Hospitalization/Surgery History - December 2020. Review of Systems (ROS) Constitutional Symptoms (General Health) Denies complaints or symptoms of Fatigue, Fever, Chills, Marked Weight Change. Eyes Denies complaints or symptoms of Dry Eyes, Vision Changes, Glasses / Contacts. Ear/Nose/Mouth/Throat Denies complaints or symptoms of Difficult clearing ears, Sinusitis. Hematologic/Lymphatic Denies complaints or symptoms of Bleeding / Clotting Disorders, Human Immunodeficiency Virus. Respiratory Denies  complaints or symptoms of Chronic or frequent coughs, Shortness of Breath. Cardiovascular Denies complaints or symptoms of Chest pain, LE edema. Gastrointestinal Denies complaints or symptoms of Frequent diarrhea, Nausea, Vomiting. Endocrine Denies complaints or symptoms of Hepatitis, Thyroid disease, Polydypsia (Excessive Thirst). Genitourinary Denies complaints or symptoms of Kidney failure/ Dialysis, Incontinence/dribbling. Immunological Denies complaints or symptoms of Hives, Itching. Integumentary (Skin) Complains or has symptoms of Wounds. Denies complaints or symptoms of Bleeding or bruising tendency, Breakdown, Swelling. Musculoskeletal Denies complaints or symptoms of Muscle Pain, Muscle Weakness. Neurologic Denies complaints or symptoms  of Numbness/parasthesias, Focal/Weakness. Psychiatric Denies complaints or symptoms of Anxiety, Claustrophobia. [name], [name] ([id number]) Objective Constitutional Sitting or standing Blood Pressure is within target range for patient.. Pulse regular and within target range for patient.Marland Kitchen Respirations regular, non-labored and within target range.. Temperature is normal and within the target range for the patient.Marland Kitchen appears in no distress. Vitals Time Taken: 9:48 AM, Height: 72 in, Source: Stated, Weight: 230 lbs, Source: Stated, BMI: 31.2, Temperature: 98.8 F, Pulse: 115 bpm, Respiratory Rate: 16 breaths/min, Blood Pressure: 117/69 mmHg. Eyes Conjunctivae clear. No discharge. Respiratory Respiratory effort is easy and symmetric bilaterally. Rate is normal at rest and on room air.. Gastrointestinal (GI) Abdomen is soft and non-distended without masses or tenderness. Bowel sounds active in all quadrants.. No liver or spleen enlargement or tenderness.Marland Kitchen Psychiatric No evidence of depression, anxiety, or agitation. Calm, cooperative, and communicative. Appropriate interactions and affect.. General Notes: Wound exam; the area in question is on his lower sacrum with a deep open wound. This connects with a wound on his right buttock with a bridge of skin over this area. There is substantial undermining around the large part of this wound although I did not appreciate any bone. The undermining extends maximally at 2:00 by perhaps 5 or 6 cm. There is a surface covering but again a deep wound that only has a thin layer of tissue over bone itself. I did remove a strand of necrotic material using pickups and a #15 blade. There was minimal bleeding Integumentary (Hair, Skin) There is no erythema around the wound. No purulent drainage no tenderness no crepitus. Wound #1 status is Open. Original cause of wound was Pressure Injury. The wound is located on the Midline Sacrum. The wound measures 3.1cm  length x 3.4cm width x 4.5cm depth; 8.278cm^2 area and 37.251cm^3 volume. There is Fat Layer (Subcutaneous Tissue) Exposed exposed. There is no tunneling or undermining noted. There is a medium amount of serosanguineous drainage noted. The wound margin is thickened. There is large (67-100%) red granulation within the wound bed. There is a small (1-33%) amount of necrotic tissue within the wound bed including Adherent Slough. Assessment Active Problems ICD-10 Pressure ulcer of sacral region, stage 4 Disruption of external operation (surgical) wound, not elsewhere classified, subsequent encounter Cutaneous abscess of buttock Morrissette, [name] ([id number]) Procedures Wound #1 Pre-procedure diagnosis of Wound #1 is a Pressure Ulcer located on the Midline Sacrum . There was a Selective/Open Wound Non-Viable Tissue Debridement with a total area of 2 sq cm performed by Ricard Dillon, MD. With the following instrument(s): Blade, Forceps, and Scissors to remove Non-Viable tissue/material. Material removed includes Slough and Fibrin/Exudate and after achieving pain control using Lidocaine. A time out was conducted at 10:19, prior to the start of the procedure. A Minimum amount of bleeding was controlled with Pressure. The procedure was tolerated well. Post Debridement Measurements: 3.1cm length x 3.4cm width x 4.5cm depth; 37.251cm^3 volume. Post debridement Stage noted  as Category/Stage III. Character of Wound/Ulcer Post Debridement is stable. Post procedure Diagnosis Wound #1: Same as Pre-Procedure Plan Wound Cleansing: Wound #1 Midline Sacrum: Clean wound with Normal Saline. Anesthetic (add to Medication List): Wound #1 Midline Sacrum: Topical Lidocaine 4% cream applied to wound bed prior to debridement (In Clinic Only). Skin Barriers/Peri-Wound Care: Wound #1 Midline Sacrum: Skin Prep Primary Wound Dressing: Wound #1 Midline Sacrum: Dry Gauze - Moistened with SIlversorb Gel Secondary  Dressing: Wound #1 Midline Sacrum: Boardered Foam Dressing Dressing Change Frequency: Wound #1 Midline Sacrum: Change dressing every day. - Family may be trained for days home health will not be there. Change Dressing Monday, Wednesday, Friday - Homehealth Follow-up Appointments: Wound #1 Midline Sacrum: Return Appointment in 1 week. Off-Loading: Wound #1 Midline Sacrum: Other: - NO Pressure on the wounded areas. Additional Orders / Instructions: Wound #1 Midline Sacrum: Increase protein intake. - Powder protein supplement, multivitamin Activity as tolerated Home Health: Wound #1 Midline Sacrum: [facility] Visits - [address], [place] ([id number]) [place] Nurse may visit PRN to address patient s wound care needs. FACE TO FACE ENCOUNTER: MEDICARE and MEDICAID PATIENTS: I certify that this patient is under my care and that I had a face-to-face encounter that meets the physician face-to-face encounter requirements with this patient on this date. The encounter with the patient was in whole or in part for the following MEDICAL CONDITION: (primary reason for [place]) MEDICAL NECESSITY: I certify, that based on my findings, NURSING services are a medically necessary home health service. HOME BOUND STATUS: I certify that my clinical findings support that this patient is homebound (i.e., Due to illness or injury, pt requires aid of supportive devices such as crutches, cane, wheelchairs, walkers, the use of special transportation or the assistance of another person to leave their place of residence. There is a normal inability to leave the home and doing so requires considerable and taxing effort. Other absences are for medical reasons / religious services and are infrequent or of short duration when for other reasons). If current dressing causes regression in wound condition, may D/C ordered dressing product/s and apply Normal Saline Moist Dressing daily until next  [name] / Other MD appointment. [place] of regression in wound condition at [phone]. Please direct any NON-WOUND related issues/requests for orders to patient's Primary Care Physician 1. I change the lubricating agent in his 3 times daily wet-to-dry dressings to silver sorb gel which will only make this a daily change. 2. This is a substantial wound area. I will check his inflammatory markers and lab work that is being followed by [name]. He is currently on ampicillin sulbactam. Although the wounds themselves look healthy there is substantial undermining widely around both of these areas. He would benefit from a wound VAC which we will look towards setting up next week after I have had a chance to review his lab work and imaging studies. 3. The bridge of skin between the 2 visible wounds may or may not be a hindrance to healing especially placement of the wound VAC. This could be removed but I do not see the need to do this currently 4. I wonder about underlying bone infection here. I will review his inflammatory markers and see if the CT scans he had done showed the bone under this area. 5. I emphasized nutritional intake, protein supplements and rigorous religious offloading of this area for as much of 24 hours a day that he can  muster. This includes while he is lying and sitting 6. He will need a good long trial of a wound VAC but would have to wonder whether he will ultimately need a myocutaneous flap. Only time will tell I spent 50 minutes in review of this patient's medical records, face-to-face evaluation and preparation of this record Electronic Signature(s) Signed: 09/05/2019 11:10:50 AM By: Linton Ham MD Entered By: Linton Ham on 09/05/2019 11:10:50 [name], [name] ([id number]) -------------------------------------------------------------------------------- ROS/PFSH Details Patient Name: Broy, [name] Date of Service: 09/05/2019  9:45 AM Medical Record Number: [id number] Patient Account Number: [id number] Date of Birth/Sex: [birth year] (54 y.o. M) Treating RN: Army Melia Primary Care Provider: [name] Other Clinician: Referring Provider: [name] Treating Provider/Extender: [name] in Treatment: 0 Information Obtained From Patient Constitutional Symptoms (General Health) Complaints and Symptoms: Negative for: Fatigue; Fever; Chills; Marked Weight Change Eyes Complaints and Symptoms: Negative for: Dry Eyes; Vision Changes; Glasses / Contacts Medical History: Negative for: Cataracts; Glaucoma; Optic Neuritis Ear/Nose/Mouth/Throat Complaints and Symptoms: Negative for: Difficult clearing ears; Sinusitis Medical History: Negative for: Chronic sinus problems/congestion; Middle ear problems Hematologic/Lymphatic Complaints and Symptoms: Negative for: Bleeding / Clotting Disorders; Human Immunodeficiency Virus Medical History: Negative for: Anemia; Hemophilia; Human Immunodeficiency Virus; Lymphedema; Sickle Cell Disease Respiratory Complaints and Symptoms: Negative for: Chronic or frequent coughs; Shortness of Breath Medical History: Negative for: Aspiration; Asthma; Chronic Obstructive Pulmonary Disease (COPD); Pneumothorax; Sleep Apnea; Tuberculosis Cardiovascular Complaints and Symptoms: Negative for: Chest pain; LE edema Medical History: Negative for: Angina; Arrhythmia; Congestive Heart Failure; Coronary Artery Disease; Deep Vein Thrombosis; Hypertension; Hypotension; Myocardial Infarction; Peripheral Arterial Disease; Peripheral Venous Disease; Phlebitis; Vasculitis Bonello, Gerell R. ([id number]) Gastrointestinal Complaints and Symptoms: Negative for: Frequent diarrhea; Nausea; Vomiting Medical History: Negative for: Cirrhosis ; Colitis; Crohnos; Hepatitis A; Hepatitis B; Hepatitis C Endocrine Complaints and Symptoms: Negative for: Hepatitis; Thyroid disease; Polydypsia  (Excessive Thirst) Medical History: Negative for: Type I Diabetes; Type II Diabetes Genitourinary Complaints and Symptoms: Negative for: Kidney failure/ Dialysis; Incontinence/dribbling Medical History: Negative for: End Stage Renal Disease Immunological Complaints and Symptoms: Negative for: Hives; Itching Medical History: Negative for: Lupus Erythematosus; Raynaudos; Scleroderma Integumentary (Skin) Complaints and Symptoms: Positive for: Wounds Negative for: Bleeding or bruising tendency; Breakdown; Swelling Musculoskeletal Complaints and Symptoms: Negative for: Muscle Pain; Muscle Weakness Medical History: Negative for: Gout; Rheumatoid Arthritis; Osteoarthritis; Osteomyelitis Neurologic Complaints and Symptoms: Negative for: Numbness/parasthesias; Focal/Weakness Medical History: Negative for: Dementia; Neuropathy; Quadriplegia; Paraplegia; Seizure Disorder Psychiatric Complaints and Symptoms: Negative for: Anxiety; Claustrophobia Medical History: Hickok, STATLER BROAS ([id number]) Negative for: Anorexia/bulimia; Confinement Anxiety Oncologic Medical History: Negative for: Received Chemotherapy; Received Radiation Immunizations Pneumococcal Vaccine: Received Pneumococcal Vaccination: No Implantable Devices None Hospitalization / Surgery History Type of Hospitalization/Surgery December 2020 Family and Social History Cancer: Yes - Father; Diabetes: No; Heart Disease: Yes - Maternal Grandparents; Hereditary Spherocytosis: No; Hypertension: No; Kidney Disease: No; Lung Disease: Yes - Mother; Seizures: No; Stroke: No; Thyroid Problems: No; Tuberculosis: No; Former smoker - 2001; Marital Status - Married; Alcohol Use: Never; Drug Use: No History; Caffeine Use: Daily; Financial Concerns: No; Food, Clothing or Shelter Needs: No; Support System Lacking: No; Transportation Concerns: No Electronic Signature(s) Signed: 09/05/2019 11:18:27 AM By: Army Melia Signed: 09/05/2019  5:43:36 PM By: Linton Ham MD Entered By: Army Melia on 09/05/2019 10:01:35 Sweatman, [name] ([id number]) -------------------------------------------------------------------------------- SuperBill Details Patient Name: Sonn, [name] Date of Service: 09/05/2019 Medical Record Number: [id number] Patient Account Number: [id number] Date of Birth/Sex: 11/05/1965 (54 y.o. M) Treating RN: [name] Primary  Care Provider: [name] Other Clinician: Referring Provider: [name] Treating Provider/Extender: Ricard Dillon Weeks in Treatment: 0 Diagnosis Coding ICD-10 Codes Code Description L89.154 Pressure ulcer of sacral region, stage 4 T81.31XD Disruption of external operation (surgical) wound, not elsewhere classified, subsequent encounter L02.31 Cutaneous abscess of buttock Facility Procedures CPT4 Code: [id number] Description: 99213 - WOUND CARE VISIT-LEV 3 EST PT Modifier: Quantity: 1 CPT4 Code: [id number] Description: [id number] - DEBRIDE WOUND 1ST 20 SQ CM OR < ICD-10 Diagnosis Description L89.154 Pressure ulcer of sacral region, stage 4 Modifier: Quantity: 1 Physician Procedures CPT4: Description Modifier Quantity Code [id number] - WC PHYS LEVEL 4 - NEW PT 25 1 ICD-10 Diagnosis Description L89.154 Pressure ulcer of sacral region, stage 4 T81.31XD Disruption of external operation (surgical) wound, not elsewhere classified,  subsequent encounter L02.31 Cutaneous abscess of buttock CPT4: [id number] - WC PHYS DEBR WO ANESTH 20 SQ CM 1 ICD-10 Diagnosis Description L89.154 Pressure ulcer of sacral region, stage 4 Electronic Signature(s) Signed: 09/05/2019 5:43:36 PM By: Linton Ham MD Entered By: Linton Ham on 09/05/2019 11:09:34

## 2019-09-06 NOTE — Progress Notes (Signed)
[name] ([id number]) Visit Report for 09/05/2019 Allergy List Details Patient Name: [name], [name] Date of Service: 09/05/2019 9:45 AM Medical Record Number: [id number] Patient Account Number: [id number] Date of Birth/Sex: 05/15/1966 (54 y.o. Male) Treating RN: Army Melia Primary Care Jossie Smoot: [name] Other Clinician: Referring Sheliah Fiorillo: [name] Treating Conn Trombetta/Extender: Ricard Dillon Weeks in Treatment: 0 Allergies Active Allergies No Known Allergies Allergy Notes Electronic Signature(s) Signed: 09/05/2019 11:18:27 AM By: Army Melia Entered By: Army Melia on 09/05/2019 09:54:18 Rockers, Candelaria [name] ([id number]) -------------------------------------------------------------------------------- Arrival Information Details Patient Name: [name] Date of Service: 09/05/2019 9:45 AM Medical Record Number: [id number] Patient Account Number: [id number] Date of Birth/Sex: 08/08/1965 (54 y.o. Male) Treating RN: Army Melia Primary Care Oseias Horsey: [name] Other Clinician: Referring Sherice Ijames: [name] Treating Harumi Yamin/Extender: [name] in Treatment: 0 Visit Information Patient Arrived: Walker Arrival Time: 09:47 Accompanied By: daughter Transfer Assistance: None Patient Identification Verified: Yes Electronic Signature(s) Signed: 09/05/2019 11:18:27 AM By: Army Melia Entered By: Army Melia on 09/05/2019 09:47:58 Dredge, Candelaria [name] ([id number]) -------------------------------------------------------------------------------- Clinic Level of Care Assessment Details Patient Name: [name] Date of Service: 09/05/2019 9:45 AM Medical Record Number: [id number] Patient Account Number: [id number] Date of Birth/Sex: 11/08/1965 (54 y.o. Male) Treating RN: [name] Primary Care Mystique Bjelland: [name] Other Clinician: Referring Nasreen Goedecke: [name] Treating Saryah Loper/Extender: [name] in Treatment: 0 Clinic Level  of Care Assessment Items TOOL 1 Quantity Score []  - Use when EandM and Procedure is performed on INITIAL visit 0 ASSESSMENTS - Nursing Assessment / Reassessment X - General Physical Exam (combine w/ comprehensive assessment (listed just below) when 1 20 performed on new pt. evals) X- 1 25 Comprehensive Assessment (HX, ROS, Risk Assessments, Wounds Hx, etc.) ASSESSMENTS - Wound and Skin Assessment / Reassessment []  - Dermatologic / Skin Assessment (not related to wound area) 0 ASSESSMENTS - Ostomy and/or Continence Assessment and Care []  - Incontinence Assessment and Management 0 []  - 0 Ostomy Care Assessment and Management (repouching, etc.) PROCESS - Coordination of Care X - Simple Patient / Family Education for ongoing care 1 15 []  - 0 Complex (extensive) Patient / Family Education for ongoing care X- 1 10 Staff obtains Programmer, systems, Records, Test Results / Process Orders []  - 0 Staff telephones HHA, Nursing Homes / Clarify orders / etc []  - 0 Routine Transfer to another Facility (non-emergent condition) []  - 0 Routine Hospital Admission (non-emergent condition) X- 1 15 New Admissions / Biomedical engineer / Ordering NPWT, Apligraf, etc. []  - 0 Emergency Hospital Admission (emergent condition) PROCESS - Special Needs []  - Pediatric / Minor Patient Management 0 []  - 0 Isolation Patient Management []  - 0 Hearing / Language / Visual special needs []  - 0 Assessment of Community assistance (transportation, D/C planning, etc.) []  - 0 Additional assistance / Altered mentation []  - 0 Support Surface(s) Assessment (bed, cushion, seat, etc.) Lindfors, Kelli R. ([id number]) INTERVENTIONS - Miscellaneous []  - External ear exam 0 []  - 0 Patient Transfer (multiple staff / Civil Service fast streamer / Similar devices) []  - 0 Simple Staple / Suture removal (25 or less) []  - 0 Complex Staple / Suture removal (26 or more) []  - 0 Hypo/Hyperglycemic Management (do not check if billed separately) []   - 0 Ankle / Brachial Index (ABI) - do not check if billed separately Has the patient been seen at the hospital within the last three years: Yes Total Score: 85 Level Of Care: New/Established - Level 3 Electronic Signature(s) Signed: 09/05/2019 5:56:57 PM  By: Gretta Cool, BSN, RN, CWS, Kim RN, BSN Entered By: Gretta Cool, BSN, RN, CWS, Kim on 09/05/2019 10:32:28 Paver, Candelaria [name] ([id number]) -------------------------------------------------------------------------------- Encounter Discharge Information Details Patient Name: [name] Date of Service: 09/05/2019 9:45 AM Medical Record Number: [id number] Patient Account Number: [id number] Date of Birth/Sex: 07/26/1965 (54 y.o. Male) Treating RN: [name] Primary Care Leonard Hendler: [name] Other Clinician: Referring Tayte Childers: [name] Treating Mckinsley Koelzer/Extender: [name] in Treatment: 0 Encounter Discharge Information Items Post Procedure Vitals Discharge Condition: Stable Temperature (F): 98.8 Ambulatory Status: Walker Pulse (bpm): 115 Discharge Destination: Home Respiratory Rate (breaths/min): 16 Transportation: Private Auto Blood Pressure (mmHg): 117/69 Accompanied By: daughter Schedule Follow-up Appointment: No Clinical Summary of Care: Electronic Signature(s) Signed: 09/05/2019 5:56:57 PM By: Gretta Cool, BSN, RN, CWS, Kim RN, BSN Entered By: Gretta Cool, BSN, RN, CWS, Kim on 09/05/2019 10:34:29 [name] ([id number]) -------------------------------------------------------------------------------- Lower Extremity Assessment Details Patient Name: Bula, Newell R. Date of Service: 09/05/2019 9:45 AM Medical Record Number: [id number] Patient Account Number: [id number] Date of Birth/Sex: 04/10/1966 (54 y.o. Male) Treating RN: Army Melia Primary Care Altovise Wahler: [name] Other Clinician: Referring Taishawn Smaldone: [name] Treating Dealie Koelzer/Extender: Ricard Dillon Weeks in Treatment: 0 Electronic  Signature(s) Signed: 09/05/2019 11:18:27 AM By: Army Melia Entered By: Army Melia on 09/05/2019 10:04:19 Bedingfield, Candelaria [name] ([id number]) -------------------------------------------------------------------------------- Multi Wound Chart Details Patient Name: Wengert, Candelaria [name]. Date of Service: 09/05/2019 9:45 AM Medical Record Number: [id number] Patient Account Number: [id number] Date of Birth/Sex: 09/05/1965 (54 y.o. Male) Treating RN: [name] Primary Care Artemis Koller: [name] Other Clinician: Referring Lydiah Pong: [name] Treating Zen Felling/Extender: Ricard Dillon Weeks in Treatment: 0 Vital Signs Height(in): 72 Pulse(bpm): 115 Weight(lbs): 230 Blood Pressure(mmHg): 117/69 Body Mass Index(BMI): 31 Temperature(F): 98.8 Respiratory Rate 16 (breaths/min): Photos: [N/A:N/A] Wound Location: Sacrum - Midline N/A N/A Wounding Event: Pressure Injury N/A N/A Primary Etiology: Pressure Ulcer N/A N/A Date Acquired: 07/24/2019 N/A N/A Weeks of Treatment: 0 N/A N/A Wound Status: Open N/A N/A Measurements L x W x D 3.1x3.4x4.5 N/A N/A (cm) Area (cm) : 8.278 N/A N/A Volume (cm) : 37.251 N/A N/A % Reduction in Area: 0.00% N/A N/A % Reduction in Volume: 0.00% N/A N/A Classification: Category/Stage IV N/A N/A Exudate Amount: Medium N/A N/A Exudate Type: Serosanguineous N/A N/A Exudate Color: red, brown N/A N/A Wound Margin: Thickened N/A N/A Granulation Amount: Large (67-100%) N/A N/A Granulation Quality: Red N/A N/A Necrotic Amount: Small (1-33%) N/A N/A Exposed Structures: Fat Layer (Subcutaneous N/A N/A Tissue) Exposed: Yes Fascia: No Tendon: No Muscle: No Joint: No Bone: No Epithelialization: None N/A N/A Debridement: N/A N/A Troyer, Candelaria [name] ([id number]) Debridement - Selective/Open Wound Pre-procedure 10:19 N/A N/A Verification/Time Out Taken: Pain Control: Lidocaine N/A N/A Tissue Debrided: Slough N/A N/A Level: Non-Viable Tissue N/A N/A Debridement Area  (sq cm): 2 N/A N/A Instrument: Blade, Forceps, Scissors N/A N/A Bleeding: Minimum N/A N/A Hemostasis Achieved: Pressure N/A N/A Debridement Treatment Procedure was tolerated well N/A N/A Response: Post Debridement 3.1x3.4x4.5 N/A N/A Measurements L x W x D (cm) Post Debridement Volume: 37.251 N/A N/A (cm) Post Debridement Stage: Category/Stage III N/A N/A Procedures Performed: Debridement N/A N/A Treatment Notes Wound #1 (Midline Sacrum) Notes Wet to dry with hydrogel; bordered foam dressing Electronic Signature(s) Signed: 09/05/2019 5:43:36 PM By: Linton Ham MD Entered By: Linton Ham on 09/05/2019 10:46:39 Glad, Candelaria [name] ([id number]) -------------------------------------------------------------------------------- Elmo Details Patient Name: Gaetano, Candelaria [name]. Date of Service: 09/05/2019 9:45 AM Medical Record Number: [id number] Patient Account Number: [id number] Date of Birth/Sex:  01/11/1966 (54 y.o. Male) Treating RN: [name] Primary Care Maia Handa: [name] Other Clinician: Referring Aahil Fredin: [name] Treating Aryona Sill/Extender: [name] in Treatment: 0 Active Inactive Necrotic Tissue Nursing Diagnoses: Impaired tissue integrity related to necrotic/devitalized tissue Goals: Necrotic/devitalized tissue will be minimized in the wound bed Date Initiated: 09/05/2019 Target Resolution Date: 09/26/2019 Goal Status: Active Interventions: Assess patient pain level pre-, during and post procedure and prior to discharge Treatment Activities: Apply topical anesthetic as ordered : 09/05/2019 Notes: Orientation to the Wound Care Program Nursing Diagnoses: Knowledge deficit related to the wound healing center program Goals: Patient/caregiver will verbalize understanding of the [place] Date Initiated: 09/05/2019 Target Resolution Date: 09/26/2019 Goal Status: Active Interventions: Provide education on  orientation to the wound center Notes: Pressure Nursing Diagnoses: Knowledge deficit related to management of pressures ulcers Goals: Patient will remain free from development of additional pressure ulcers Date Initiated: 09/05/2019 Target Resolution Date: 10/03/2019 Goal Status: Active Arko, SARITH WESTLY ([id number]) Patient/caregiver will verbalize risk factors for pressure ulcer development Date Initiated: 09/05/2019 Target Resolution Date: 09/26/2019 Goal Status: Active Patient/caregiver will verbalize understanding of pressure ulcer management Date Initiated: 09/05/2019 Target Resolution Date: 10/03/2019 Goal Status: Active Interventions: Assess: immobility, friction, shearing, incontinence upon admission and as needed Provide education on pressure ulcers Notes: Wound/Skin Impairment Nursing Diagnoses: Impaired tissue integrity Goals: Patient/caregiver will verbalize understanding of skin care regimen Date Initiated: 09/05/2019 Target Resolution Date: 09/26/2019 Goal Status: Active Ulcer/skin breakdown will have a volume reduction of 30% by week 4 Date Initiated: 09/05/2019 Target Resolution Date: 10/03/2019 Goal Status: Active Interventions: Assess ulceration(s) every visit Treatment Activities: Referred to DME Anya Murphey for dressing supplies : 09/05/2019 Notes: Electronic Signature(s) Signed: 09/05/2019 5:56:57 PM By: Gretta Cool, BSN, RN, CWS, Kim RN, BSN Entered By: Gretta Cool, BSN, RN, CWS, Kim on 09/05/2019 10:19:02 Boardman, Candelaria [name] ([id number]) -------------------------------------------------------------------------------- Pain Assessment Details Patient Name: Bonny, [name] Date of Service: 09/05/2019 9:45 AM Medical Record Number: [id number] Patient Account Number: [id number] Date of Birth/Sex: 02/23/1966 (54 y.o. Male) Treating RN: Army Melia Primary Care Marx Doig: [name] Other Clinician: Referring Talmage Teaster: [name] Treating Christie Copley/Extender: Ricard Dillon Weeks in Treatment: 0 Active Problems Location of Pain Severity and Description of Pain Patient Has Paino Yes Site Locations Pain Location: Pain in Ulcers Rate the pain. Current Pain Level: 4 Pain Management and Medication Current Pain Management: Electronic Signature(s) Signed: 09/05/2019 11:18:27 AM By: Army Melia Entered By: Army Melia on 09/05/2019 09:48:08 Yang, Candelaria [name] ([id number]) -------------------------------------------------------------------------------- Patient/Caregiver Education Details Patient Name: Chalupa, Candelaria [name]. Date of Service: 09/05/2019 9:45 AM Medical Record Number: [id number] Patient Account Number: [id number] Date of Birth/Gender: 12/07/1965 (54 y.o. Male) Treating RN: [name] Primary Care Physician: [name] Other Clinician: Referring Physician: [name] Treating Physician/Extender: [name] in Treatment: 0 Education Assessment Education Provided To: Patient Education Topics Provided Pressure: Handouts: Pressure Ulcers: Care and Offloading Methods: Demonstration, Explain/Verbal Responses: State content correctly Wound/Skin Impairment: Handouts: Caring for Your Ulcer Methods: Demonstration, Explain/Verbal Responses: State content correctly Electronic Signature(s) Signed: 09/05/2019 5:56:57 PM By: Gretta Cool, BSN, RN, CWS, Kim RN, BSN Entered By: Gretta Cool, BSN, RN, CWS, Kim on 09/05/2019 10:33:07 Maravilla, Candelaria [name] ([id number]) -------------------------------------------------------------------------------- Wound Assessment Details Patient Name: Quast, [name] Date of Service: 09/05/2019 9:45 AM Medical Record Number: [id number] Patient Account Number: [id number] Date of Birth/Sex: 04/22/1966 (54 y.o. Male) Treating RN: [name] Primary Care Dennys Guin: [name] Other Clinician: Referring Miu Chiong: [name] Treating Christyl Osentoski/Extender: Ricard Dillon Weeks in Treatment: 0  Wound Status Wound Number: 1 Primary  Etiology: Pressure Ulcer Wound Location: Sacrum - Midline Wound Status: Open Wounding Event: Pressure Injury Date Acquired: 07/24/2019 Weeks Of Treatment: 0 Clustered Wound: No Photos Wound Measurements Length: (cm) 3.1 % Reduction i Width: (cm) 3.4 % Reduction i Depth: (cm) 4.5 Epithelializa Area: (cm) 8.278 Tunneling: Volume: (cm) 37.251 Undermining: n Area: 0% n Volume: 0% tion: None No No Wound Description Classification: Category/Stage IV Foul Odor Aft Wound Margin: Thickened Slough/Fibrin Exudate Amount: Medium Exudate Type: Serosanguineous Exudate Color: red, brown er Cleansing: No o Yes Wound Bed Granulation Amount: Large (67-100%) Exposed Structure Granulation Quality: Red Fascia Exposed: No Necrotic Amount: Small (1-33%) Fat Layer (Subcutaneous Tissue) Exposed: Yes Necrotic Quality: Adherent Slough Tendon Exposed: No Muscle Exposed: No Joint Exposed: No Bone Exposed: No Treatment Notes Mateja, JAHMI DEBRUHL ([id number]) Wound #1 (Midline Sacrum) Notes Wet to dry with hydrogel; bordered foam dressing Electronic Signature(s) Signed: 09/05/2019 10:34:35 AM By: Army Melia Signed: 09/05/2019 5:56:57 PM By: Gretta Cool, BSN, RN, CWS, Kim RN, BSN Entered By: Army Melia on 09/05/2019 10:34:35 Rahl, Candelaria [name] ([id number]) -------------------------------------------------------------------------------- Vitals Details Patient Name: Featherly, Candelaria [name]. Date of Service: 09/05/2019 9:45 AM Medical Record Number: [id number] Patient Account Number: [id number] Date of Birth/Sex: 07/23/1966 (54 y.o. Male) Treating RN: Army Melia Primary Care Zayla Agar: [name] Other Clinician: Referring Danyla Wattley: [name] Treating Liah Morr/Extender: Ricard Dillon Weeks in Treatment: 0 Vital Signs Time Taken: 09:48 Temperature (F): 98.8 Height (in): 72 Pulse (bpm): 115 Source: Stated Respiratory Rate (breaths/min): 16 Weight (lbs): 230 Blood Pressure (mmHg): 117/69 Source:  Stated Reference Range: 80 - 120 mg / dl Body Mass Index (BMI): 31.2 Electronic Signature(s) Signed: 09/05/2019 11:18:27 AM By: Army Melia Entered By: Army Melia on 09/05/2019 09:48:37

## 2019-09-07 DIAGNOSIS — M4646 Discitis, unspecified, lumbar region: Secondary | ICD-10-CM | POA: Diagnosis not present

## 2019-09-07 DIAGNOSIS — R7881 Bacteremia: Secondary | ICD-10-CM | POA: Diagnosis not present

## 2019-09-10 DIAGNOSIS — L89304 Pressure ulcer of unspecified buttock, stage 4: Secondary | ICD-10-CM | POA: Diagnosis not present

## 2019-09-10 DIAGNOSIS — L89894 Pressure ulcer of other site, stage 4: Secondary | ICD-10-CM | POA: Diagnosis not present

## 2019-09-10 DIAGNOSIS — B9561 Methicillin susceptible Staphylococcus aureus infection as the cause of diseases classified elsewhere: Secondary | ICD-10-CM | POA: Diagnosis not present

## 2019-09-10 DIAGNOSIS — M4646 Discitis, unspecified, lumbar region: Secondary | ICD-10-CM | POA: Diagnosis not present

## 2019-09-10 DIAGNOSIS — K6812 Psoas muscle abscess: Secondary | ICD-10-CM | POA: Diagnosis not present

## 2019-09-10 DIAGNOSIS — I081 Rheumatic disorders of both mitral and tricuspid valves: Secondary | ICD-10-CM | POA: Diagnosis not present

## 2019-09-10 DIAGNOSIS — M4604 Spinal enthesopathy, thoracic region: Secondary | ICD-10-CM | POA: Diagnosis not present

## 2019-09-10 DIAGNOSIS — M4804 Spinal stenosis, thoracic region: Secondary | ICD-10-CM | POA: Diagnosis not present

## 2019-09-10 DIAGNOSIS — M4316 Spondylolisthesis, lumbar region: Secondary | ICD-10-CM | POA: Diagnosis not present

## 2019-09-10 DIAGNOSIS — G834 Cauda equina syndrome: Secondary | ICD-10-CM | POA: Diagnosis not present

## 2019-09-10 DIAGNOSIS — A419 Sepsis, unspecified organism: Secondary | ICD-10-CM | POA: Diagnosis not present

## 2019-09-10 DIAGNOSIS — G061 Intraspinal abscess and granuloma: Secondary | ICD-10-CM | POA: Diagnosis not present

## 2019-09-10 DIAGNOSIS — R7881 Bacteremia: Secondary | ICD-10-CM | POA: Diagnosis not present

## 2019-09-10 DIAGNOSIS — M47816 Spondylosis without myelopathy or radiculopathy, lumbar region: Secondary | ICD-10-CM | POA: Diagnosis not present

## 2019-09-10 DIAGNOSIS — M5144 Schmorl's nodes, thoracic region: Secondary | ICD-10-CM | POA: Diagnosis not present

## 2019-09-10 DIAGNOSIS — L89312 Pressure ulcer of right buttock, stage 2: Secondary | ICD-10-CM | POA: Diagnosis not present

## 2019-09-10 DIAGNOSIS — T8142XA Infection following a procedure, deep incisional surgical site, initial encounter: Secondary | ICD-10-CM | POA: Diagnosis not present

## 2019-09-10 DIAGNOSIS — M48061 Spinal stenosis, lumbar region without neurogenic claudication: Secondary | ICD-10-CM | POA: Diagnosis not present

## 2019-09-12 ENCOUNTER — Other Ambulatory Visit: Payer: Self-pay

## 2019-09-12 ENCOUNTER — Encounter: Payer: BC Managed Care – PPO | Admitting: Internal Medicine

## 2019-09-12 ENCOUNTER — Other Ambulatory Visit: Payer: Self-pay | Admitting: Family Medicine

## 2019-09-12 DIAGNOSIS — L89159 Pressure ulcer of sacral region, unspecified stage: Secondary | ICD-10-CM | POA: Diagnosis not present

## 2019-09-12 DIAGNOSIS — Z87891 Personal history of nicotine dependence: Secondary | ICD-10-CM | POA: Diagnosis not present

## 2019-09-12 DIAGNOSIS — Z96642 Presence of left artificial hip joint: Secondary | ICD-10-CM | POA: Diagnosis not present

## 2019-09-12 DIAGNOSIS — L89154 Pressure ulcer of sacral region, stage 4: Secondary | ICD-10-CM | POA: Diagnosis not present

## 2019-09-12 MED ORDER — OXYCODONE HCL 10 MG PO TABS: 10.0000 mg | ORAL_TABLET | ORAL | 0 refills | Status: DC | PRN

## 2019-09-12 NOTE — Progress Notes (Signed)
Hollon, JAROLD CACAL ([id number]) Visit Report for 09/12/2019 HPI Details Patient Name: [name], [name] Date of Service: 09/12/2019 10:30 AM Medical Record Number: [id number] Patient Account Number: [id number] Date of Birth/Sex: 1965/09/22 (54 y.o. M) Treating RN: [name] Primary Care Provider: [name] Other Clinician: Referring Provider: [name] Treating Provider/Extender: [name] in Treatment: 1 History of Present Illness HPI Description: ADMISSION 09/05/2019 This is a 54 year old man who has a complicated history which started with acute low back pain sometime in September. Saw his primary doctor had a CT scan of the abdomen and pelvis that was normal. By late December he was discovered to have a lumbar epidural abscess with cauda equina syndrome. He was admitted to hospital and had a decompressive laminectomy at L3/L4/L5 blood cultures at the time showed MSSA he received IV nafcillin. He went to rehab from 1/5 through 1/15 there he was noted to have an "pressure injury in the skin" although I did not see much more about this in the discharge instructions. Unfortunately this wound became infected after he was discharged home. He required readmission from 1/25 through 2/2 with sepsis, discovered to have a right psoas abscess as well as an infected decubitus ulcer. He had a bedside debridement by general surgery on 1/25 interventional radiology drain the right psoas abscess. General surgery recommended 3 times daily wet-to-dry dressings and an air mattress. He has since been discharged home. He is mobile with a walker. They are using 3 times daily wet-to-dry dressings. According to his family he is eating well. He has a protein supplement but he he is not taking it. He is currently on ampicillin sulbactam as recommended by [name] this is due to be finished currently on 2/22 although there seem to be some suggestion it might go longer. They say he had blood work 2  days ago which I will review. The last lab work I saw was a sedimentation rate of 81 and a C-reactive protein of 212 on 1/25. I did not see any imaging studies of the underlying bone although I will need to review the CT scans that he has had Past medical history, polymyalgia rheumatica, left total hip replacement in 2018 for avascular necrosis, cellulitis of the scrotum in July 2020, history of alcohol abuse but that is not currently problematic 09/12/2019; patient readmitted to the clinic last week. I put him on a wet-to-dry dressing with silver sorb gel to the larger wound area on his coccyx and proximal right buttocks. These are actually connected. In general the wound surfaces look quite good although they are deep and there is a large amount of undermining. There is no exposed bone. Last lab work on 2/8 that was ordered by [name] showed a CRP of 68.5 and a sedimentation rate of greater than 130. The CRP was elevated versus 18.3 on 1/10 although it is difficult to interpret these necessarily in somebody with polymyalgia rheumatica. His white count was 15.7 hemoglobin 10.7 The patient is offloading this is much as he can says that he is eating and drinking well which seems verified by his family member. Electronic Signature(s) Signed: 09/12/2019 4:30:10 PM By: Linton Ham MD Entered By: Linton Ham on 09/12/2019 11:58:30 Doorn, [name] ([id number]) -------------------------------------------------------------------------------- Physical Exam Details Patient Name: [name], [name] Date of Service: 09/12/2019 10:30 AM Medical Record Number: [id number] Patient Account Number: [id number] Date of Birth/Sex: 05/09/1966 (54 y.o. M) Treating RN: [name] Primary Care Provider: [name] Other Clinician: Referring Provider: Einar Pheasant,  JESSICA Treating Provider/Extender: Ricard Dillon Weeks in Treatment: 1 Constitutional Sitting or standing Blood Pressure is within target range  for patient.. Pulse regular and within target range for patient.Marland Kitchen Respirations regular, non-labored and within target range.. Temperature is normal and within the target range for the patient.Marland Kitchen appears in no distress. Notes Wound exam; the area in question is on the lower sacrum which connects with a wound on the proximal right buttock. There is a bridge of skin over these areas however the 2 areas connect with substantial undermining around the large part of the superior part of both of these wound areas. I still do not appreciate any bone. What I can feel of the base of this suggest healthy granulation no debridement is required. There is no erythema around the wound no soft tissue crepitus and no tenderness Electronic Signature(s) Signed: 09/12/2019 4:30:10 PM By: Linton Ham MD Entered By: Linton Ham on 09/12/2019 12:00:26 Tseng, [name] ([id number]) -------------------------------------------------------------------------------- Physician Orders Details Patient Name: [name], [name] Date of Service: 09/12/2019 10:30 AM Medical Record Number: [id number] Patient Account Number: [id number] Date of Birth/Sex: 05/05/1966 (54 y.o. M) Treating RN: [name] Primary Care Provider: [name] Other Clinician: Referring Provider: [name] Treating Provider/Extender: [name] in Treatment: 1 Verbal / Phone Orders: No Diagnosis Coding Wound Cleansing Wound #1 Midline Sacrum o Clean wound with Normal Saline. Anesthetic (add to Medication List) Wound #1 Midline Sacrum o Topical Lidocaine 4% cream applied to wound bed prior to debridement (In Clinic Only). Skin Barriers/Peri-Wound Care Wound #1 Midline Sacrum o Skin Prep Primary Wound Dressing Wound #1 Midline Sacrum o Dry Gauze - Moistened with SIlversorb Gel (hydrogel in clinic) Secondary Dressing Wound #1 Midline Sacrum o Boardered Foam Dressing Dressing Change Frequency Wound #1 Midline  Sacrum o Change dressing every day. - Family may be trained for days home health will not be there. o Change Dressing Monday, Wednesday, Friday - Homehealth Follow-up Appointments Wound #1 Midline Sacrum o Return Appointment in 1 week. Off-Loading Wound #1 Midline Sacrum o Other: - NO Pressure on the wounded areas. Additional Orders / Instructions Wound #1 Midline Sacrum o Increase protein intake. - Powder protein supplement, multivitamin o Activity as tolerated Home Health Wound #1 Midline Sacrum Genrich, GRIFFEN LUZZI ([id number]) o [place] Visits - [place] Nurse may visit PRN to address patientos wound care needs. o FACE TO FACE ENCOUNTER: MEDICARE and MEDICAID PATIENTS: I certify that this patient is under my care and that I had a face-to-face encounter that meets the physician face-to-face encounter requirements with this patient on this date. The encounter with the patient was in whole or in part for the following MEDICAL CONDITION: (primary reason for [place]) MEDICAL NECESSITY: I certify, that based on my findings, NURSING services are a medically necessary home health service. HOME BOUND STATUS: I certify that my clinical findings support that this patient is homebound (i.e., Due to illness or injury, pt requires aid of supportive devices such as crutches, cane, wheelchairs, walkers, the use of special transportation or the assistance of another person to leave their place of residence. There is a normal inability to leave the home and doing so requires considerable and taxing effort. Other absences are for medical reasons / religious services and are infrequent or of short duration when for other reasons). o If current dressing causes regression in wound condition, may D/C ordered dressing product/s and apply Normal Saline Moist Dressing daily until next [place] /  Other MD appointment. [place] of  regression in wound condition at [phone]. o Please direct any NON-WOUND related issues/requests for orders to patient's Primary Care Physician Electronic Signature(s) Signed: 09/12/2019 4:30:10 PM By: Linton Ham MD Signed: 09/12/2019 5:27:48 PM By: Gretta Cool, BSN, RN, CWS, Kim RN, BSN Entered By: Gretta Cool, BSN, RN, CWS, Kim on 09/12/2019 11:14:53 Szatkowski, [name] ([id number]) -------------------------------------------------------------------------------- Problem List Details Patient Name: Codispoti, JONTAY CORNWELL. Date of Service: 09/12/2019 10:30 AM Medical Record Number: [id number] Patient Account Number: [id number] Date of Birth/Sex: 03/20/1966 (54 y.o. M) Treating RN: [name] Primary Care Provider: [name] Other Clinician: Referring Provider: [name] Treating Provider/Extender: [name] in Treatment: 1 Active Problems ICD-10 Evaluated Encounter Code Description Active Date Today Diagnosis L89.154 Pressure ulcer of sacral region, stage 4 09/05/2019 No Yes T81.31XD Disruption of external operation (surgical) wound, not 09/05/2019 No Yes elsewhere classified, subsequent encounter L02.31 Cutaneous abscess of buttock 09/05/2019 No Yes Inactive Problems Resolved Problems Electronic Signature(s) Signed: 09/12/2019 4:30:10 PM By: Linton Ham MD Entered By: Linton Ham on 09/12/2019 11:55:11 Razon, [name] ([id number]) -------------------------------------------------------------------------------- Progress Note Details Patient Name: Mcguirk, [name] Date of Service: 09/12/2019 10:30 AM Medical Record Number: [id number] Patient Account Number: [id number] Date of Birth/Sex: [birth year] (54 y.o. M) Treating RN: [name] Primary Care Provider: [name] Other Clinician: Referring Provider: [name] Treating Provider/Extender: [name] in Treatment: 1 Subjective History of Present Illness (HPI) ADMISSION 09/05/2019 This is a  54 year old man who has a complicated history which started with acute low back pain sometime in September. Saw his primary doctor had a CT scan of the abdomen and pelvis that was normal. By late December he was discovered to have a lumbar epidural abscess with cauda equina syndrome. He was admitted to hospital and had a decompressive laminectomy at L3/L4/L5 blood cultures at the time showed MSSA he received IV nafcillin. He went to rehab from 1/5 through 1/15 there he was noted to have an "pressure injury in the skin" although I did not see much more about this in the discharge instructions. Unfortunately this wound became infected after he was discharged home. He required readmission from 1/25 through 2/2 with sepsis, discovered to have a right psoas abscess as well as an infected decubitus ulcer. He had a bedside debridement by general surgery on 1/25 interventional radiology drain the right psoas abscess. General surgery recommended 3 times daily wet-to-dry dressings and an air mattress. He has since been discharged home. He is mobile with a walker. They are using 3 times daily wet-to-dry dressings. According to his family he is eating well. He has a protein supplement but he he is not taking it. He is currently on ampicillin sulbactam as recommended by [name] this is due to be finished currently on 2/22 although there seem to be some suggestion it might go longer. They say he had blood work 2 days ago which I will review. The last lab work I saw was a sedimentation rate of 81 and a C-reactive protein of 212 on 1/25. I did not see any imaging studies of the underlying bone although I will need to review the CT scans that he has had Past medical history, polymyalgia rheumatica, left total hip replacement in 2018 for avascular necrosis, cellulitis of the scrotum in July 2020, history of alcohol abuse but that is not currently problematic 09/12/2019; patient readmitted to the clinic last week.  I put him on a wet-to-dry dressing with silver sorb  gel to the larger wound area on his coccyx and proximal right buttocks. These are actually connected. In general the wound surfaces look quite good although they are deep and there is a large amount of undermining. There is no exposed bone. Last lab work on 2/8 that was ordered by [name] showed a CRP of 68.5 and a sedimentation rate of greater than 130. The CRP was elevated versus 18.3 on 1/10 although it is difficult to interpret these necessarily in somebody with polymyalgia rheumatica. His white count was 15.7 hemoglobin 10.7 The patient is offloading this is much as he can says that he is eating and drinking well which seems verified by his family member. Objective Constitutional Sitting or standing Blood Pressure is within target range for patient.. Pulse regular and within target range for patient.Marland Kitchen Respirations regular, non-labored and within target range.. Temperature is normal and within the target range for the patient.Marland Kitchen appears in no distress. Meroney, ZARAK CATON ([id number]) Vitals Time Taken: 10:51 AM, Height: 72 in, Weight: 230 lbs, BMI: 31.2, Temperature: 97.9 F, Pulse: 97 bpm, Respiratory Rate: 16 breaths/min, Blood Pressure: 120/64 mmHg. General Notes: Wound exam; the area in question is on the lower sacrum which connects with a wound on the proximal right buttock. There is a bridge of skin over these areas however the 2 areas connect with substantial undermining around the large part of the superior part of both of these wound areas. I still do not appreciate any bone. What I can feel of the base of this suggest healthy granulation no debridement is required. There is no erythema around the wound no soft tissue crepitus and no tenderness Integumentary (Hair, Skin) Wound #1 status is Open. Original cause of wound was Pressure Injury. The wound is located on the Midline Sacrum. The wound measures 2.5cm length x 3.4cm width  x 4.1cm depth; 6.676cm^2 area and 27.371cm^3 volume. There is Fat Layer (Subcutaneous Tissue) Exposed exposed. There is no undermining noted, however, there is tunneling at 11:00 with a maximum distance of 4cm. There is a medium amount of serosanguineous drainage noted. The wound margin is thickened. There is large (67-100%) red granulation within the wound bed. There is a small (1-33%) amount of necrotic tissue within the wound bed including Adherent Slough. Assessment Active Problems ICD-10 Pressure ulcer of sacral region, stage 4 Disruption of external operation (surgical) wound, not elsewhere classified, subsequent encounter Cutaneous abscess of buttock Plan Wound Cleansing: Wound #1 Midline Sacrum: Clean wound with Normal Saline. Anesthetic (add to Medication List): Wound #1 Midline Sacrum: Topical Lidocaine 4% cream applied to wound bed prior to debridement (In Clinic Only). Skin Barriers/Peri-Wound Care: Wound #1 Midline Sacrum: Skin Prep Primary Wound Dressing: Wound #1 Midline Sacrum: Dry Gauze - Moistened with SIlversorb Gel (hydrogel in clinic) Secondary Dressing: Wound #1 Midline Sacrum: Boardered Foam Dressing Dressing Change Frequency: Wound #1 Midline Sacrum: Change dressing every day. - Family may be trained for days home health will not be there. Change Dressing Monday, Wednesday, Friday - Homehealth Follow-up Appointments: ECTOR, KLEPACKI ([id number]) Wound #1 Midline Sacrum: Return Appointment in 1 week. Off-Loading: Wound #1 Midline Sacrum: Other: - NO Pressure on the wounded areas. Additional Orders / Instructions: Wound #1 Midline Sacrum: Increase protein intake. - Powder protein supplement, multivitamin Activity as tolerated Home Health: Wound #1 Midline Sacrum: Continue Home Health Visits - [facility] Nurse may visit PRN to address patient s wound care needs. FACE TO FACE ENCOUNTER: MEDICARE and MEDICAID PATIENTS: I certify that this  patient is under my care and that I had a face-to-face encounter that meets the physician face-to-face encounter requirements with this patient on this date. The encounter with the patient was in whole or in part for the following MEDICAL CONDITION: (primary reason for [place]) MEDICAL NECESSITY: I certify, that based on my findings, NURSING services are a medically necessary home health service. HOME BOUND STATUS: I certify that my clinical findings support that this patient is homebound (i.e., Due to illness or injury, pt requires aid of supportive devices such as crutches, cane, wheelchairs, walkers, the use of special transportation or the assistance of another person to leave their place of residence. There is a normal inability to leave the home and doing so requires considerable and taxing effort. Other absences are for medical reasons / religious services and are infrequent or of short duration when for other reasons). If current dressing causes regression in wound condition, may D/C ordered dressing product/s and apply Normal Saline Moist Dressing daily until next [place] / Other MD appointment. [place] of regression in wound condition at [phone]. Please direct any NON-WOUND related issues/requests for orders to patient's Primary Care Physician 1. The infected sacral decubitus ulcer and proximal right buttock which are actually at interconnecting surface looks stable to improved there is no exposed bone and no obvious clinical evidence of infection. 2. The patient sees [name] again next week he is currently on Unasyn [ampicillin sulbactam]. As noted his inflammatory markers are both elevated including the CRP elevated from the last measured value at 68.5 however the patient has polymyalgia rheumatica I am not sure that these necessarily have the same significance. 3. He also has a psoas muscle abscess. I wonder whether the CT scan is  going to need to be repeated I am going to leave this up to [name] for now. 4. I would like to put a wound VAC on this area but I am not ready to do this until we are more certain about the absence of underlying infection Electronic Signature(s) Signed: 09/12/2019 4:30:10 PM By: Linton Ham MD Entered By: Linton Ham on 09/12/2019 12:02:30 Rotondo, [name] ([id number]) -------------------------------------------------------------------------------- SuperBill Details Patient Name: Vanroekel, [name] Date of Service: 09/12/2019 Medical Record Number: [id number] Patient Account Number: [id number] Date of Birth/Sex: 06/23/1966 (54 y.o. M) Treating RN: [name] Primary Care Provider: [name] Other Clinician: Referring Provider: [name] Treating Provider/Extender: [name] in Treatment: 1 Diagnosis Coding ICD-10 Codes Code Description L89.154 Pressure ulcer of sacral region, stage 4 T81.31XD Disruption of external operation (surgical) wound, not elsewhere classified, subsequent encounter L02.31 Cutaneous abscess of buttock Facility Procedures CPT4 Code: [id number] Description: 99213 - WOUND CARE VISIT-LEV 3 EST PT Modifier: Quantity: 1 Physician Procedures CPT4: Description Modifier Quantity Code [id number] - WC PHYS LEVEL 3 - EST PT 1 ICD-10 Diagnosis Description L89.154 Pressure ulcer of sacral region, stage 4 T81.31XD Disruption of external operation (surgical) wound, not elsewhere classified,  subsequent encounter Electronic Signature(s) Signed: 09/12/2019 4:30:10 PM By: Linton Ham MD Entered By: Linton Ham on 09/12/2019 12:02:56

## 2019-09-12 NOTE — Progress Notes (Signed)
[name] [name] PIOLI ([id number]) Visit Report for 09/12/2019 Arrival Information Details Patient Name: [name] [name] MILHOUSE. Date of Service: 09/12/2019 10:30 AM Medical Record Number: [id number] Patient Account Number: [id number] Date of Birth/Sex: 12/13/1965 (54 y.o. M) Treating RN: [name] [name] Primary Care [name] [name]: [name] [name] Other Clinician: Referring [name] [name]: [name] [name] Treating [name] [name]/Extender: [name] [name] in Treatment: 1 Visit Information History Since Last Visit Added or deleted any medications: No Patient Arrived: [name] [name] Any new allergies or adverse reactions: No Arrival Time: 10:50 Had a fall or experienced change in No Accompanied By: daughter activities of daily living that may affect Transfer Assistance: None risk of falls: Patient Identification Verified: Yes Signs or symptoms of abuse/neglect since last visito No Hospitalized since last visit: No Has Dressing in Place as Prescribed: Yes Pain Present Now: No Electronic Signature(s) Signed: 09/12/2019 4:03:02 PM By: [name] [name] Entered By: [name] [name] on 09/12/2019 10:51:09 [name] [name] [name] ([id number]) -------------------------------------------------------------------------------- Clinic Level of Care Assessment Details Patient Name: [name] [name] [name]. Date of Service: 09/12/2019 10:30 AM Medical Record Number: [id number] Patient Account Number: [id number] Date of Birth/Sex: 10/02/1965 (54 y.o. M) Treating RN: [name] [name] Primary Care [name] [name]: [name] [name] Other Clinician: Referring [name] [name]: [name] [name] Treating [name] [name]/Extender: [name] [name] in Treatment: 1 Clinic Level of Care Assessment Items TOOL 4 Quantity Score []  - Use when only an EandM is performed on FOLLOW-UP visit 0 ASSESSMENTS - Nursing Assessment / Reassessment X - Reassessment of Co-morbidities (includes updates in patient status) 1 10 X- 1 5 Reassessment of Adherence to Treatment Plan ASSESSMENTS - Wound and Skin  Assessment / Reassessment X - Simple Wound Assessment / Reassessment - one wound 1 5 []  - 0 Complex Wound Assessment / Reassessment - multiple wounds []  - 0 Dermatologic / Skin Assessment (not related to wound area) ASSESSMENTS - Focused Assessment []  - Circumferential Edema Measurements - multi extremities 0 []  - 0 Nutritional Assessment / Counseling / Intervention []  - 0 Lower Extremity Assessment (monofilament, tuning fork, pulses) []  - 0 Peripheral Arterial Disease Assessment (using hand held doppler) ASSESSMENTS - Ostomy and/or Continence Assessment and Care []  - Incontinence Assessment and Management 0 []  - 0 Ostomy Care Assessment and Management (repouching, etc.) PROCESS - Coordination of Care X - Simple Patient / Family Education for ongoing care 1 15 []  - 0 Complex (extensive) Patient / Family Education for ongoing care X- 1 10 Staff obtains Programmer, systems, Records, Test Results / Process Orders []  - 0 Staff telephones HHA, Nursing Homes / Clarify orders / etc []  - 0 Routine Transfer to another Facility (non-emergent condition) []  - 0 Routine Hospital Admission (non-emergent condition) []  - 0 New Admissions / Biomedical engineer / Ordering NPWT, Apligraf, etc. []  - 0 Emergency Hospital Admission (emergent condition) X- 1 10 Simple Discharge Coordination [name] [name] JOYNER. ([id number]) []  - 0 Complex (extensive) Discharge Coordination PROCESS - Special Needs []  - Pediatric / Minor Patient Management 0 []  - 0 Isolation Patient Management []  - 0 Hearing / Language / Visual special needs []  - 0 Assessment of Community assistance (transportation, D/C planning, etc.) []  - 0 Additional assistance / Altered mentation []  - 0 Support Surface(s) Assessment (bed, cushion, seat, etc.) INTERVENTIONS - Wound Cleansing / Measurement X - Simple Wound Cleansing - one wound 1 5 []  - 0 Complex Wound Cleansing - multiple wounds X- 1 5 Wound Imaging (photographs - any number of  wounds) []  - 0 Wound Tracing (instead of photographs) X- 1 5 Simple Wound Measurement - one wound []  -  0 Complex Wound Measurement - multiple wounds INTERVENTIONS - Wound Dressings []  - Small Wound Dressing one or multiple wounds 0 X- 1 15 Medium Wound Dressing one or multiple wounds []  - 0 Large Wound Dressing one or multiple wounds []  - 0 Application of Medications - topical []  - 0 Application of Medications - injection INTERVENTIONS - Miscellaneous []  - External ear exam 0 []  - 0 Specimen Collection (cultures, biopsies, blood, body fluids, etc.) []  - 0 Specimen(s) / Culture(s) sent or taken to Lab for analysis []  - 0 Patient Transfer (multiple staff / Civil Service fast streamer / Similar devices) []  - 0 Simple Staple / Suture removal (25 or less) []  - 0 Complex Staple / Suture removal (26 or more) []  - 0 Hypo / Hyperglycemic Management (close monitor of Blood Glucose) []  - 0 Ankle / Brachial Index (ABI) - do not check if billed separately X- 1 5 Vital Signs [name] [name] HORTA. ([id number]) Has the patient been seen at the hospital within the last three years: Yes Total Score: 90 Level Of Care: New/Established - Level 3 Electronic Signature(s) Signed: 09/12/2019 5:27:48 PM By: [name] [name], BSN, RN, CWS, [name] [name] Entered By: [name] [name] on 09/12/2019 11:15:36 [name] [name] [name] ([id number]) -------------------------------------------------------------------------------- Encounter Discharge Information Details Patient Name: [name] [name] [name] R. Date of Service: 09/12/2019 10:30 AM Medical Record Number: [id number] Patient Account Number: [id number] Date of Birth/Sex: 03/11/1966 (54 y.o. M) Treating RN: [name] [name] Primary Care Ondrea Dow: [name] [name] Other Clinician: Referring [name] [name]: [name] [name] Treating [name] [name]/Extender: [name] [name] in Treatment: 1 Encounter Discharge Information Items Discharge Condition: Stable Ambulatory Status: [name] [name] Discharge Destination:  Home Transportation: Private Auto Accompanied By: daughter Schedule Follow-up Appointment: Yes Clinical Summary of Care: Electronic Signature(s) Signed: 09/12/2019 5:27:48 PM By: [name] [name], BSN, RN, CWS, [name] [name] Entered By: [name] [name] on 09/12/2019 11:16:35 Steinhoff, [name] [name] ([id number]) -------------------------------------------------------------------------------- Lower Extremity Assessment Details Patient Name: [name] [name] [name] R. Date of Service: 09/12/2019 10:30 AM Medical Record Number: [id number] Patient Account Number: [id number] Date of Birth/Sex: 05/04/1966 (54 y.o. M) Treating RN: [name] [name] Primary Care Deovion Batrez: [name] [name] Other Clinician: Referring Mckennon Zwart: [name] [name] Treating Marthe Dant/Extender: Ricard Dillon Weeks in Treatment: 1 Electronic Signature(s) Signed: 09/12/2019 4:03:02 PM By: [name] [name] Entered By: [name] [name] on 09/12/2019 10:59:13 Spaeth, [name] [name] ([id number]) -------------------------------------------------------------------------------- Multi Wound Chart Details Patient Name: Parmer, [name] [name]. Date of Service: 09/12/2019 10:30 AM Medical Record Number: [id number] Patient Account Number: [id number] Date of Birth/Sex: 10/22/1965 (54 y.o. M) Treating RN: [name] [name] Primary Care Lisset Ketchem: [name] [name] Other Clinician: Referring Nidia Grogan: [name] [name] Treating Coyt Govoni/Extender: [name] [name] in Treatment: 1 Vital Signs Height(in): 72 Pulse(bpm): 97 Weight(lbs): 230 Blood Pressure(mmHg): 120/64 Body Mass Index(BMI): 31 Temperature(F): 97.9 Respiratory Rate 16 (breaths/min): Photos: [N/A:N/A] Wound Location: Sacrum - Midline N/A N/A Wounding Event: Pressure Injury N/A N/A Primary Etiology: Pressure Ulcer N/A N/A Date Acquired: 07/24/2019 N/A N/A Weeks of Treatment: 1 N/A N/A Wound Status: Open N/A N/A Measurements L x W x D 2.5x3.4x4.1 N/A N/A (cm) Area (cm) : 6.676 N/A N/A Volume (cm) : 27.371 N/A N/A %  Reduction in Area: 19.40% N/A N/A % Reduction in Volume: 26.50% N/A N/A Position 1 (o'clock): 11 Maximum Distance 1 (cm): 4 Tunneling: Yes N/A N/A Classification: Category/Stage IV N/A N/A Exudate Amount: Medium N/A N/A Exudate Type: Serosanguineous N/A N/A Exudate Color: red, brown N/A N/A Wound Margin: Thickened N/A N/A Granulation Amount: Large (67-100%) N/A N/A Granulation Quality: Red N/A N/A Necrotic  Amount: Small (1-33%) N/A N/A Exposed Structures: Fat Layer (Subcutaneous N/A N/A Tissue) Exposed: Yes Fascia: No Tendon: No Muscle: No [name] [name] [name] R. ([id number]) Joint: No Bone: No Epithelialization: None N/A N/A Treatment Notes Wound #1 (Midline Sacrum) Notes Wet to dry with hydrogel; bordered foam dressing Electronic Signature(s) Signed: 09/12/2019 4:30:10 PM By: Linton Ham MD Entered By: Linton Ham on 09/12/2019 11:55:20 Casanova, [name] [name] ([id number]) -------------------------------------------------------------------------------- [place] Details Patient Name: Baldree, [name] [name]. Date of Service: 09/12/2019 10:30 AM Medical Record Number: [id number] Patient Account Number: [id number] Date of Birth/Sex: 12/05/1965 (54 y.o. M) Treating RN: [name] [name] Primary Care Buford Gayler: [name] [name] Other Clinician: Referring Kirill Chatterjee: [name] [name] Treating Lavinia Mcneely/Extender: [name] [name] in Treatment: 1 Active Inactive Necrotic Tissue Nursing Diagnoses: Impaired tissue integrity related to necrotic/devitalized tissue Goals: Necrotic/devitalized tissue will be minimized in the wound bed Date Initiated: 09/05/2019 Target Resolution Date: 09/26/2019 Goal Status: Active Interventions: Assess patient pain level pre-, during and post procedure and prior to discharge Treatment Activities: Apply topical anesthetic as ordered : 09/05/2019 Notes: Orientation to the Wound Care Program Nursing Diagnoses: Knowledge deficit related to the wound healing  center program Goals: Patient/caregiver will verbalize understanding of the [place] Date Initiated: 09/05/2019 Target Resolution Date: 09/26/2019 Goal Status: Active Interventions: Provide education on orientation to the wound center Notes: Pressure Nursing Diagnoses: Knowledge deficit related to management of pressures ulcers Goals: Patient will remain free from development of additional pressure ulcers Date Initiated: 09/05/2019 Target Resolution Date: 10/03/2019 Goal Status: Active [name] [name] BENKO ([id number]) Patient/caregiver will verbalize risk factors for pressure ulcer development Date Initiated: 09/05/2019 Target Resolution Date: 09/26/2019 Goal Status: Active Patient/caregiver will verbalize understanding of pressure ulcer management Date Initiated: 09/05/2019 Target Resolution Date: 10/03/2019 Goal Status: Active Interventions: Assess: immobility, friction, shearing, incontinence upon admission and as needed Provide education on pressure ulcers Notes: Wound/Skin Impairment Nursing Diagnoses: Impaired tissue integrity Goals: Patient/caregiver will verbalize understanding of skin care regimen Date Initiated: 09/05/2019 Target Resolution Date: 09/26/2019 Goal Status: Active Ulcer/skin breakdown will have a volume reduction of 30% by week 4 Date Initiated: 09/05/2019 Target Resolution Date: 10/03/2019 Goal Status: Active Interventions: Assess ulceration(s) every visit Treatment Activities: Referred to DME Serrena Linderman for dressing supplies : 09/05/2019 Notes: Electronic Signature(s) Signed: 09/12/2019 5:27:48 PM By: [name] [name], BSN, RN, CWS, [name] [name] Entered By: [name] [name] on 09/12/2019 11:12:44 Scharf, [name] [name] ([id number]) -------------------------------------------------------------------------------- Pain Assessment Details Patient Name: Feick, [name] [name] R. Date of Service: 09/12/2019 10:30 AM Medical Record Number: [id number] Patient Account  Number: [id number] Date of Birth/Sex: 07/26/1965 (54 y.o. M) Treating RN: [name] [name] Primary Care Luna Audia: [name] [name] Other Clinician: Referring Aela Bohan: [name] [name] Treating Dusan Lipford/Extender: Ricard Dillon Weeks in Treatment: 1 Active Problems Location of Pain Severity and Description of Pain Patient Has Paino No Site Locations Pain Management and Medication Current Pain Management: Electronic Signature(s) Signed: 09/12/2019 4:03:02 PM By: [name] [name] Entered By: [name] [name] on 09/12/2019 10:51:32 Augusta, [name] [name] ([id number]) -------------------------------------------------------------------------------- Patient/Caregiver Education Details Patient Name: Mecum, [name] [name]. Date of Service: 09/12/2019 10:30 AM Medical Record Number: [id number] Patient Account Number: [id number] Date of Birth/Gender: 07/15/1966 (54 y.o. M) Treating RN: [name] [name] Primary Care Physician: [name] [name] Other Clinician: Referring Physician: Waunita [name] Treating Physician/Extender: [name] [name] in Treatment: 1 Education Assessment Education Provided To: Patient Education Topics Provided Pressure: Handouts: Pressure Ulcers: Care and Offloading Wound/Skin Impairment: Handouts: Caring for Your Ulcer Methods: Demonstration, Explain/Verbal Responses: State content correctly Electronic Signature(s) Signed: 09/12/2019 5:27:48  PM By: [name] [name], BSN, RN, CWS, [name] [name] Entered By: [name] [name] on 09/12/2019 11:15:57 Mccluskey, [name] [name] ([id number]) -------------------------------------------------------------------------------- Wound Assessment Details Patient Name: Glascoe, ZARIEL BOLING. Date of Service: 09/12/2019 10:30 AM Medical Record Number: [id number] Patient Account Number: [id number] Date of Birth/Sex: 15-Jan-1966 (54 y.o. M) Treating RN: [name] [name] Primary Care Kimeka Badour: [name] [name] Other Clinician: Referring Tana Trefry: [name] [name] Treating Armie Moren/Extender: Ricard Dillon Weeks in Treatment: 1 Wound Status Wound Number: 1 Primary Etiology: Pressure Ulcer Wound Location: Sacrum - Midline Wound Status: Open Wounding Event: Pressure Injury Date Acquired: 07/24/2019 Weeks Of Treatment: 1 Clustered Wound: No Photos Wound Measurements Length: (cm) 2.5 % Reduction i Width: (cm) 3.4 % Reduction i Depth: (cm) 4.1 Epithelializa Area: (cm) 6.676 Tunneling: Volume: (cm) 27.371 Position Maximum Di n Area: 19.4% n Volume: 26.5% tion: None Yes (o'clock): 11 stance: (cm) 4 Undermining: No Wound Description Classification: Category/Stage IV Foul Odor Af Wound Margin: Thickened Slough/Fibri Exudate Amount: Medium Exudate Type: Serosanguineous Exudate Color: red, brown ter Cleansing: No no Yes Wound Bed Granulation Amount: Large (67-100%) Exposed Structure Granulation Quality: Red Fascia Exposed: No Necrotic Amount: Small (1-33%) Fat Layer (Subcutaneous Tissue) Exposed: Yes Necrotic Quality: Adherent Slough Tendon Exposed: No Muscle Exposed: No Joint Exposed: No Bone Exposed: No Schleyer, Jacobey R. ([id number]) Treatment Notes Wound #1 (Midline Sacrum) Notes Wet to dry with hydrogel; bordered foam dressing Electronic Signature(s) Signed: 09/12/2019 5:27:48 PM By: [name] [name], BSN, RN, CWS, [name] [name] Entered By: [name] [name] on 09/12/2019 11:12:37 Brzoska, [name] [name] ([id number]) -------------------------------------------------------------------------------- Vitals Details Patient Name: Landowski, [name] [name]. Date of Service: 09/12/2019 10:30 AM Medical Record Number: [id number] Patient Account Number: [id number] Date of Birth/Sex: March 11, 1966 (54 y.o. M) Treating RN: [name] [name] Primary Care Welford Christmas: [name] [name] Other Clinician: Referring Terea Neubauer: [name] [name] Treating Hazely Sealey/Extender: [name] [name] in Treatment: 1 Vital Signs Time Taken: 10:51 Temperature (F): 97.9 Height (in): 72 Pulse (bpm): 97 Weight (lbs):  230 Respiratory Rate (breaths/min): 16 Body Mass Index (BMI): 31.2 Blood Pressure (mmHg): 120/64 Reference Range: 80 - 120 mg / dl Electronic Signature(s) Signed: 09/12/2019 4:03:02 PM By: [name] [name] Entered By: [name] [name] on 09/12/2019 10:52:36

## 2019-09-12 NOTE — Telephone Encounter (Signed)
Patient's Daughter Ander Purpura called requesting a refill  OXYCODONE  She stated he has 10 tablets left but with bad weather she wanted to make sure he does not go without   [facility] [place]

## 2019-09-12 NOTE — Telephone Encounter (Signed)
Name of Medication: Oxycodone 10 mg Name of [facility]acy:[facility] [place] or Written Date and Quantity:#100 on 08/20/19  Last Office Visit and Type:09/04/19 HFU &     07/18/19 back pain  Next Office Visit and Type:none scheduled  Last Controlled Substance Agreement Date:none  Last [id number]

## 2019-09-12 NOTE — Telephone Encounter (Signed)
Please have patient schedule appointment for future refills.

## 2019-09-14 MED ORDER — CYCLOBENZAPRINE HCL 10 MG PO TABS: 10.0000 mg | ORAL_TABLET | Freq: Three times a day (TID) | ORAL | 0 refills | Status: DC | PRN

## 2019-09-14 NOTE — Telephone Encounter (Signed)
Patient advised and appointment made for 10/02/19. Patient states he also needs Flexeril refilled. He is out of the medication.  Not sure if Dr Einar Pheasant will be able to review this message today. Sending to Tor Netters also to see if she can help. Thank you

## 2019-09-14 NOTE — Addendum Note (Signed)
Addended by: Kris Mouton on: 09/14/2019 02:19 PM   Modules accepted: Orders

## 2019-09-17 ENCOUNTER — Ambulatory Visit (INDEPENDENT_AMBULATORY_CARE_PROVIDER_SITE_OTHER): Payer: BC Managed Care – PPO | Admitting: Internal Medicine

## 2019-09-17 ENCOUNTER — Encounter: Payer: Self-pay | Admitting: Internal Medicine

## 2019-09-17 ENCOUNTER — Other Ambulatory Visit: Payer: Self-pay

## 2019-09-17 DIAGNOSIS — I081 Rheumatic disorders of both mitral and tricuspid valves: Secondary | ICD-10-CM | POA: Diagnosis not present

## 2019-09-17 DIAGNOSIS — M47816 Spondylosis without myelopathy or radiculopathy, lumbar region: Secondary | ICD-10-CM | POA: Diagnosis not present

## 2019-09-17 DIAGNOSIS — M4316 Spondylolisthesis, lumbar region: Secondary | ICD-10-CM | POA: Diagnosis not present

## 2019-09-17 DIAGNOSIS — B9561 Methicillin susceptible Staphylococcus aureus infection as the cause of diseases classified elsewhere: Secondary | ICD-10-CM | POA: Diagnosis not present

## 2019-09-17 DIAGNOSIS — M4804 Spinal stenosis, thoracic region: Secondary | ICD-10-CM | POA: Diagnosis not present

## 2019-09-17 DIAGNOSIS — M48061 Spinal stenosis, lumbar region without neurogenic claudication: Secondary | ICD-10-CM | POA: Diagnosis not present

## 2019-09-17 DIAGNOSIS — R7881 Bacteremia: Secondary | ICD-10-CM | POA: Diagnosis not present

## 2019-09-17 DIAGNOSIS — M4604 Spinal enthesopathy, thoracic region: Secondary | ICD-10-CM | POA: Diagnosis not present

## 2019-09-17 DIAGNOSIS — L89894 Pressure ulcer of other site, stage 4: Secondary | ICD-10-CM | POA: Diagnosis not present

## 2019-09-17 DIAGNOSIS — A419 Sepsis, unspecified organism: Secondary | ICD-10-CM | POA: Diagnosis not present

## 2019-09-17 DIAGNOSIS — M5144 Schmorl's nodes, thoracic region: Secondary | ICD-10-CM | POA: Diagnosis not present

## 2019-09-17 DIAGNOSIS — G834 Cauda equina syndrome: Secondary | ICD-10-CM | POA: Diagnosis not present

## 2019-09-17 DIAGNOSIS — K6812 Psoas muscle abscess: Secondary | ICD-10-CM | POA: Diagnosis not present

## 2019-09-17 DIAGNOSIS — G061 Intraspinal abscess and granuloma: Secondary | ICD-10-CM | POA: Diagnosis not present

## 2019-09-17 DIAGNOSIS — L89312 Pressure ulcer of right buttock, stage 2: Secondary | ICD-10-CM | POA: Diagnosis not present

## 2019-09-17 DIAGNOSIS — T8142XA Infection following a procedure, deep incisional surgical site, initial encounter: Secondary | ICD-10-CM | POA: Diagnosis not present

## 2019-09-17 DIAGNOSIS — M4646 Discitis, unspecified, lumbar region: Secondary | ICD-10-CM | POA: Diagnosis not present

## 2019-09-17 NOTE — Assessment & Plan Note (Signed)
I am hopeful that his MSSA bacteremia, lumbar infection, psoas abscess and sacral wound infection have all been cured.  I discussed management options with him.  They are in agreement with stopping ampicillin sulbactam, observing off antibiotics and having his PICC removed.  His inflammatory markers remain elevated notes in Epic mentioned a history of polymyalgia rheumatica but neither Mr. Bomer nor his daughter could confirm that history.  He will follow-up here in 1 month.

## 2019-09-17 NOTE — Progress Notes (Signed)
[facility] for Infectious Disease  Patient Active Problem List   Diagnosis Date Noted  . Sepsis ([place]) 08/20/2019  . Infected pressure ulcer 08/20/2019  . Psoas abscess, right ([place]) 08/20/2019  . Hyponatremia 08/20/2019  . Pressure injury of skin 08/02/2019  . Cauda equina syndrome ([place]) 07/31/2019  . Bacteremia due to methicillin susceptible Staphylococcus aureus (MSSA) 07/30/2019  . Lumbar discitis 07/30/2019  . Cerebral septic emboli ([place]) 07/30/2019  . S/P lumbar laminectomy 07/25/2019  . H/O MSSA epidural abscess, L2-L5 07/25/2019  . Acute bilateral low back pain without sciatica 07/18/2019  . Abdominal pain 07/18/2019  . Anemia 07/18/2019  . Obesity (BMI 35.0-39.9 without comorbidity) 07/18/2019  . Constipation 07/18/2019  . Bilateral leg pain 07/12/2019  . Bilateral leg edema 04/01/2017  . Elevated blood sugar 04/01/2017  . Avascular necrosis of bone of hip, left ([place]) 10/12/2016  . Alcohol abuse, in remission 10/12/2016  . S/P total hip arthroplasty 10/12/2016    Patient's Medications  New Prescriptions   No medications on file  Previous Medications   ACETAMINOPHEN (TYLENOL) 325 MG TABLET    Take 2 tablets (650 mg total) by mouth every 4 (four) hours as needed for mild pain ((score 1 to 3) or temp > 100.5).   CYCLOBENZAPRINE (FLEXERIL) 10 MG TABLET    Take 1 tablet (10 mg total) by mouth 3 (three) times daily as needed for muscle spasms.   OXYCODONE HCL 10 MG TABS    Take 1 tablet (10 mg total) by mouth every 4 (four) hours as needed (For pain).  Modified Medications   No medications on file  Discontinued Medications   AMPICILLIN-SULBACTAM (UNASYN) IVPB    Inject 3 g into the vein every 6 (six) hours for 25 days. Indication:  Wound Infection Last Day of Therapy:  09/18/19 Labs - Once weekly:  CBC/D and BMP, Labs - Every other week:  ESR and CRP   SODIUM CHLORIDE 0.9 % SOLN 452 ML WITH NAFCILLIN 2 G SOLR 12 G    Inject 12 g into the vein daily.     Subjective: [name] is in with his daughter for his hospital follow-up visit.  He was hospitalized last month with MSSA bacteremia complicated by a lumbar infection.  There was concerns about the possibility of CNS emboli although there was never any clear evidence of endocarditis by exam or TTE.  TEE was not pursued because it was felt that this would not change his management or outcome.  He received 4 weeks of IV nafcillin before switching to ampicillin sulbactam.  The switch was made because he had developed a sacral pressure sore that appeared infected.  He has now been discharged home.  He is improving slowly.  He has not had any problems tolerating his PICC for ampicillin sulbactam.  He has now completed 54 days of IV antibiotic therapy.  His sacral wound is improving.  His back pain is improving slowly.  He is getting by with less medication but he is still using narcotics frequently.  He has been having problems with constipation the last few days.  He says that he is hungry but that he is still not eating very much.  He says that nothing tastes good.    Review of Systems: Review of Systems  Constitutional: Positive for malaise/fatigue. Negative for fever and weight loss.  Respiratory: Negative for cough and shortness of breath.   Cardiovascular: Negative for chest pain.  Gastrointestinal: Positive for  constipation. Negative for abdominal pain, diarrhea, nausea and vomiting.  Musculoskeletal: Positive for back pain.  Skin: Negative for rash.  Neurological: Negative for focal weakness.    Past Medical History:  Diagnosis Date  . History of chicken pox   . Medical history non-contributory     Social History   Tobacco Use  . Smoking status: Former Smoker    Packs/day: 1.00    Years: 14.00    Pack years: 14.00    Types: Cigarettes    Quit date: 07/27/1999    Years since quitting: 20.1  . Smokeless tobacco: Never Used  Substance Use Topics  . Alcohol use: Yes     Alcohol/week: 24.0 standard drinks    Types: 24 Cans of beer per week    Comment: 4-5 beers per day, case of beer per week  . Drug use: No    Family History  Problem Relation Age of Onset  . Cancer Father        Hodgkin's disease  . COPD Mother   . Heart attack Maternal Grandfather 80  . Diabetes Neg Hx   . Stroke Neg Hx   . Hypertension Neg Hx   . Hyperlipidemia Neg Hx     Allergies  Allergen Reactions  . Bee Venom Anaphylaxis  . Hydrocodone Nausea And Vomiting    Objective: Vitals:   09/17/19 1503  Weight: 230 lb (104.3 kg)  Height: 6' (1.829 m)   Body mass index is 31.19 kg/m.  Physical Exam Constitutional:      Comments: He is standing in the exam room leaning on his walker.  He is accompanied by his daughter.  Cardiovascular:     Rate and Rhythm: Normal rate and regular rhythm.     Heart sounds: No murmur.  Pulmonary:     Effort: Pulmonary effort is normal.     Breath sounds: Normal breath sounds.  Musculoskeletal:     Comments: His daughter has been changing his sacral wound dressing 3 times daily.  Notes from the wound center do not indicate any signs of ongoing infection.  His lumbar surgical incision is healed without signs of infection.  Skin:    Findings: No rash.     Lab Results 08/20/2019 Sed rate 81 C-reactive protein 212    Problem List Items Addressed This Visit      Unprioritized   Bacteremia due to methicillin susceptible Staphylococcus aureus (MSSA)    I am hopeful that his MSSA bacteremia, lumbar infection, psoas abscess and sacral wound infection have all been cured.  I discussed management options with him.  They are in agreement with stopping ampicillin sulbactam, observing off antibiotics and having his PICC removed.  His inflammatory markers remain elevated notes in Epic mentioned a history of polymyalgia rheumatica but neither Mr. Cleckler nor his daughter could confirm that history.  He will follow-up here in 1 month.           Michel Bickers, MD [facility] [place] [facility] [phone] pager   [phone] cell 09/17/2019, 3:25 PM

## 2019-09-17 NOTE — Progress Notes (Signed)
Per verbal order from Dr [name], 44 cm Single Lumen Peripherally Inserted Central Catheter removed from right basilic, tip intact. No sutures present. RN confirmed length per chart. Dressing was clean and dry. Petroleum dressing applied. Pt advised no heavy lifting with this arm, leave dressing for 24 hours and call the office or seek emergent care if dressing becomes soaked with blood or swelling or sharp pain presents. Patient verbalized understanding and agreement.  Patient's questions answered to their satisfaction. Patient tolerated procedure well, RN walked patient to check out. RN notified [facility], Cassie Kuppelweiser.  Landis Gandy, RN

## 2019-09-19 ENCOUNTER — Other Ambulatory Visit: Payer: Self-pay

## 2019-09-19 ENCOUNTER — Encounter: Payer: BC Managed Care – PPO | Admitting: Internal Medicine

## 2019-09-19 DIAGNOSIS — M4604 Spinal enthesopathy, thoracic region: Secondary | ICD-10-CM | POA: Diagnosis not present

## 2019-09-19 DIAGNOSIS — Z87891 Personal history of nicotine dependence: Secondary | ICD-10-CM | POA: Diagnosis not present

## 2019-09-19 DIAGNOSIS — T8142XA Infection following a procedure, deep incisional surgical site, initial encounter: Secondary | ICD-10-CM | POA: Diagnosis not present

## 2019-09-19 DIAGNOSIS — R7881 Bacteremia: Secondary | ICD-10-CM | POA: Diagnosis not present

## 2019-09-19 DIAGNOSIS — M5144 Schmorl's nodes, thoracic region: Secondary | ICD-10-CM | POA: Diagnosis not present

## 2019-09-19 DIAGNOSIS — L89894 Pressure ulcer of other site, stage 4: Secondary | ICD-10-CM | POA: Diagnosis not present

## 2019-09-19 DIAGNOSIS — G834 Cauda equina syndrome: Secondary | ICD-10-CM | POA: Diagnosis not present

## 2019-09-19 DIAGNOSIS — I081 Rheumatic disorders of both mitral and tricuspid valves: Secondary | ICD-10-CM | POA: Diagnosis not present

## 2019-09-19 DIAGNOSIS — M4316 Spondylolisthesis, lumbar region: Secondary | ICD-10-CM | POA: Diagnosis not present

## 2019-09-19 DIAGNOSIS — B9561 Methicillin susceptible Staphylococcus aureus infection as the cause of diseases classified elsewhere: Secondary | ICD-10-CM | POA: Diagnosis not present

## 2019-09-19 DIAGNOSIS — G061 Intraspinal abscess and granuloma: Secondary | ICD-10-CM | POA: Diagnosis not present

## 2019-09-19 DIAGNOSIS — S31000A Unspecified open wound of lower back and pelvis without penetration into retroperitoneum, initial encounter: Secondary | ICD-10-CM | POA: Diagnosis not present

## 2019-09-19 DIAGNOSIS — K6812 Psoas muscle abscess: Secondary | ICD-10-CM | POA: Diagnosis not present

## 2019-09-19 DIAGNOSIS — L89312 Pressure ulcer of right buttock, stage 2: Secondary | ICD-10-CM | POA: Diagnosis not present

## 2019-09-19 DIAGNOSIS — M4804 Spinal stenosis, thoracic region: Secondary | ICD-10-CM | POA: Diagnosis not present

## 2019-09-19 DIAGNOSIS — M4646 Discitis, unspecified, lumbar region: Secondary | ICD-10-CM | POA: Diagnosis not present

## 2019-09-19 DIAGNOSIS — L89154 Pressure ulcer of sacral region, stage 4: Secondary | ICD-10-CM | POA: Diagnosis not present

## 2019-09-19 DIAGNOSIS — Z96642 Presence of left artificial hip joint: Secondary | ICD-10-CM | POA: Diagnosis not present

## 2019-09-19 DIAGNOSIS — M47816 Spondylosis without myelopathy or radiculopathy, lumbar region: Secondary | ICD-10-CM | POA: Diagnosis not present

## 2019-09-19 DIAGNOSIS — M48061 Spinal stenosis, lumbar region without neurogenic claudication: Secondary | ICD-10-CM | POA: Diagnosis not present

## 2019-09-21 DIAGNOSIS — L89894 Pressure ulcer of other site, stage 4: Secondary | ICD-10-CM | POA: Diagnosis not present

## 2019-09-21 DIAGNOSIS — R7881 Bacteremia: Secondary | ICD-10-CM | POA: Diagnosis not present

## 2019-09-21 DIAGNOSIS — L89312 Pressure ulcer of right buttock, stage 2: Secondary | ICD-10-CM | POA: Diagnosis not present

## 2019-09-21 DIAGNOSIS — I081 Rheumatic disorders of both mitral and tricuspid valves: Secondary | ICD-10-CM | POA: Diagnosis not present

## 2019-09-21 DIAGNOSIS — G834 Cauda equina syndrome: Secondary | ICD-10-CM | POA: Diagnosis not present

## 2019-09-21 DIAGNOSIS — T8142XA Infection following a procedure, deep incisional surgical site, initial encounter: Secondary | ICD-10-CM | POA: Diagnosis not present

## 2019-09-21 DIAGNOSIS — K6812 Psoas muscle abscess: Secondary | ICD-10-CM | POA: Diagnosis not present

## 2019-09-21 DIAGNOSIS — M4804 Spinal stenosis, thoracic region: Secondary | ICD-10-CM | POA: Diagnosis not present

## 2019-09-21 DIAGNOSIS — M5144 Schmorl's nodes, thoracic region: Secondary | ICD-10-CM | POA: Diagnosis not present

## 2019-09-21 DIAGNOSIS — B9561 Methicillin susceptible Staphylococcus aureus infection as the cause of diseases classified elsewhere: Secondary | ICD-10-CM | POA: Diagnosis not present

## 2019-09-21 DIAGNOSIS — M4604 Spinal enthesopathy, thoracic region: Secondary | ICD-10-CM | POA: Diagnosis not present

## 2019-09-21 DIAGNOSIS — M4646 Discitis, unspecified, lumbar region: Secondary | ICD-10-CM | POA: Diagnosis not present

## 2019-09-21 DIAGNOSIS — M47816 Spondylosis without myelopathy or radiculopathy, lumbar region: Secondary | ICD-10-CM | POA: Diagnosis not present

## 2019-09-21 DIAGNOSIS — M48061 Spinal stenosis, lumbar region without neurogenic claudication: Secondary | ICD-10-CM | POA: Diagnosis not present

## 2019-09-21 DIAGNOSIS — M4316 Spondylolisthesis, lumbar region: Secondary | ICD-10-CM | POA: Diagnosis not present

## 2019-09-21 DIAGNOSIS — G061 Intraspinal abscess and granuloma: Secondary | ICD-10-CM | POA: Diagnosis not present

## 2019-09-24 DIAGNOSIS — L89304 Pressure ulcer of unspecified buttock, stage 4: Secondary | ICD-10-CM | POA: Diagnosis not present

## 2019-09-24 DIAGNOSIS — L89312 Pressure ulcer of right buttock, stage 2: Secondary | ICD-10-CM | POA: Diagnosis not present

## 2019-09-25 DIAGNOSIS — L89154 Pressure ulcer of sacral region, stage 4: Secondary | ICD-10-CM | POA: Diagnosis not present

## 2019-09-26 ENCOUNTER — Encounter: Payer: BC Managed Care – PPO | Attending: Internal Medicine | Admitting: Internal Medicine

## 2019-09-26 ENCOUNTER — Other Ambulatory Visit: Payer: Self-pay

## 2019-09-26 DIAGNOSIS — I081 Rheumatic disorders of both mitral and tricuspid valves: Secondary | ICD-10-CM | POA: Diagnosis not present

## 2019-09-26 DIAGNOSIS — Z881 Allergy status to other antibiotic agents status: Secondary | ICD-10-CM | POA: Diagnosis not present

## 2019-09-26 DIAGNOSIS — M48061 Spinal stenosis, lumbar region without neurogenic claudication: Secondary | ICD-10-CM | POA: Diagnosis not present

## 2019-09-26 DIAGNOSIS — R7881 Bacteremia: Secondary | ICD-10-CM | POA: Diagnosis not present

## 2019-09-26 DIAGNOSIS — L0231 Cutaneous abscess of buttock: Secondary | ICD-10-CM | POA: Insufficient documentation

## 2019-09-26 DIAGNOSIS — K6812 Psoas muscle abscess: Secondary | ICD-10-CM | POA: Diagnosis not present

## 2019-09-26 DIAGNOSIS — M4316 Spondylolisthesis, lumbar region: Secondary | ICD-10-CM | POA: Diagnosis not present

## 2019-09-26 DIAGNOSIS — M4604 Spinal enthesopathy, thoracic region: Secondary | ICD-10-CM | POA: Diagnosis not present

## 2019-09-26 DIAGNOSIS — G834 Cauda equina syndrome: Secondary | ICD-10-CM | POA: Diagnosis not present

## 2019-09-26 DIAGNOSIS — M353 Polymyalgia rheumatica: Secondary | ICD-10-CM | POA: Insufficient documentation

## 2019-09-26 DIAGNOSIS — L89154 Pressure ulcer of sacral region, stage 4: Secondary | ICD-10-CM | POA: Diagnosis not present

## 2019-09-26 DIAGNOSIS — M4804 Spinal stenosis, thoracic region: Secondary | ICD-10-CM | POA: Diagnosis not present

## 2019-09-26 DIAGNOSIS — G061 Intraspinal abscess and granuloma: Secondary | ICD-10-CM | POA: Diagnosis not present

## 2019-09-26 DIAGNOSIS — Z8619 Personal history of other infectious and parasitic diseases: Secondary | ICD-10-CM | POA: Diagnosis not present

## 2019-09-26 DIAGNOSIS — X58XXXD Exposure to other specified factors, subsequent encounter: Secondary | ICD-10-CM | POA: Diagnosis not present

## 2019-09-26 DIAGNOSIS — M5144 Schmorl's nodes, thoracic region: Secondary | ICD-10-CM | POA: Diagnosis not present

## 2019-09-26 DIAGNOSIS — L89159 Pressure ulcer of sacral region, unspecified stage: Secondary | ICD-10-CM | POA: Diagnosis not present

## 2019-09-26 DIAGNOSIS — M47816 Spondylosis without myelopathy or radiculopathy, lumbar region: Secondary | ICD-10-CM | POA: Diagnosis not present

## 2019-09-26 DIAGNOSIS — T8142XA Infection following a procedure, deep incisional surgical site, initial encounter: Secondary | ICD-10-CM | POA: Diagnosis not present

## 2019-09-26 DIAGNOSIS — L89894 Pressure ulcer of other site, stage 4: Secondary | ICD-10-CM | POA: Diagnosis not present

## 2019-09-26 DIAGNOSIS — T8131XD Disruption of external operation (surgical) wound, not elsewhere classified, subsequent encounter: Secondary | ICD-10-CM | POA: Diagnosis not present

## 2019-09-26 DIAGNOSIS — Z96642 Presence of left artificial hip joint: Secondary | ICD-10-CM | POA: Insufficient documentation

## 2019-09-26 DIAGNOSIS — B9561 Methicillin susceptible Staphylococcus aureus infection as the cause of diseases classified elsewhere: Secondary | ICD-10-CM | POA: Diagnosis not present

## 2019-09-26 DIAGNOSIS — L89312 Pressure ulcer of right buttock, stage 2: Secondary | ICD-10-CM | POA: Diagnosis not present

## 2019-09-26 DIAGNOSIS — M4646 Discitis, unspecified, lumbar region: Secondary | ICD-10-CM | POA: Diagnosis not present

## 2019-09-26 NOTE — Progress Notes (Signed)
Sigman, ARNESH [name] ([id number]) Visit Report for 09/26/2019 HPI Details Patient Name: [name] Date of Service: 09/26/2019 12:30 PM Medical Record Number: [id number] Patient Account Number: [id number] Date of Birth/Sex: 1966-05-13 (54 y.o. M) Treating RN: [name] Primary Care Provider: Waunita [name] Other Clinician: Referring Provider: Waunita [name] Treating Provider/Extender: [name] in Treatment: 3 History of Present Illness HPI Description: ADMISSION 09/05/2019 This is a 54 year old man who has a complicated history which started with acute low back pain sometime in September. Saw his primary doctor had a CT scan of the abdomen and pelvis that was normal. By late December he was discovered to have a lumbar epidural abscess with cauda equina syndrome. He was admitted to hospital and had a decompressive laminectomy at L3/L4/L5 blood cultures at the time showed MSSA he received IV nafcillin. He went to rehab from 1/5 through 1/15 there he was noted to have an "pressure injury in the skin" although I did not see much more about this in the discharge instructions. Unfortunately this wound became infected after he was discharged home. He required readmission from 1/25 through 2/2 with sepsis, discovered to have a right psoas abscess as well as an infected decubitus ulcer. He had a bedside debridement by general surgery on 1/25 interventional radiology drain the right psoas abscess. General surgery recommended 3 times daily wet-to-dry dressings and an air mattress. He has since been discharged home. He is mobile with a walker. They are using 3 times daily wet-to-dry dressings. According to his family he is eating well. He has a protein supplement but he he is not taking it. He is currently on ampicillin sulbactam as recommended by Dr. Megan [name] this is due to be finished currently on 2/22 although there seem to be some suggestion it might go longer. They say he had blood work 2 days  ago which I will review. The last lab work I saw was a sedimentation rate of 81 and a C-reactive protein of 212 on 1/25. I did not see any imaging studies of the underlying bone although I will need to review the CT scans that he has had Past medical history, polymyalgia rheumatica, left total hip replacement in 2018 for avascular necrosis, cellulitis of the scrotum in July 2020, history of alcohol abuse but that is not currently problematic 09/12/2019; patient readmitted to the clinic last week. I put him on a wet-to-dry dressing with silver sorb gel to the larger wound area on his coccyx and proximal right buttocks. These are actually connected. In general the wound surfaces look quite good although they are deep and there is a large amount of undermining. There is no exposed bone. Last lab work on 2/8 that was ordered by Dr. Megan [name] showed a CRP of 68.5 and a sedimentation rate of greater than 130. The CRP was elevated versus 18.3 on 1/10 although it is difficult to interpret these necessarily in somebody with polymyalgia rheumatica. His white count was 15.7 hemoglobin 10.7 The patient is offloading this is much as he can says that he is eating and drinking well which seems verified by his family member. 2/24; patient with a large wound on the lower sacrum and proximal right buttocks. There is a bridge of overlying skin but the wounds are connected and there is wide undermining. We are using wet-to-dry dressings. I have reviewed Dr. Hale [name] notes of 09/17/2019 he is remove the PICC line. He had a prolonged course of IV antibiotics for the MSSA bacteremia, lumbar infection, psoas  abscess and a necrotic sacral wound. Interestingly I noticed the same history of PMR in [place] link that Dr. Megan [name] comments on. I went over the case again with the patient and his daughter he is totally unfamiliar with anything to do with polymyalgia rheumatica or its obvious symptoms that I also described. I told  him that this is not a diagnosis that most doctors would make and put in an E HR without documentation but he is just not familiar with it and neither his his daughter. For now we are using wet- to-dry dressings. I think it is time to try a wound VAC now that we are certain about underlying infection issues 3/3; he arrives today with his wound actually looks somewhat better. Two small areas with a bridge of skin actually have contracted I believe there is less depth. There is still extensive undermining. He got his VAC machine yesterday but they have still not put it on. Electronic Signature(s) Signed: 09/26/2019 4:59:37 PM By: [name] Ham MD Entered By: [name] on 09/26/2019 12:57:36 [name] ([id number]) -------------------------------------------------------------------------------- Physical Exam Details Patient Name: [name] Date of Service: 09/26/2019 12:30 PM Medical Record Number: [id number] Patient Account Number: [id number] Date of Birth/Sex: [birth year] (54 y.o. M) Treating RN: [name] Primary Care Provider: Waunita [name] Other Clinician: Referring Provider: Waunita [name] Treating Provider/Extender: [name] in Treatment: 3 Notes Wound exam; the area in question is in the lower sacrum which connects with an area on the proximal right buttock. Bridge of skin over this. The orifice is actually looks smaller and I think there is less depth. There is still extensive undermining there is no erythema no tenderness no Electronic Signature(s) Signed: 09/26/2019 4:59:37 PM By: [name] Ham MD Entered By: [name] on 09/26/2019 12:58:47 [name] ([id number]) -------------------------------------------------------------------------------- Physician Orders Details Patient Name: [name] Date of Service: 09/26/2019 12:30 PM Medical Record Number: [id number] Patient Account Number: [id number] Date of Birth/Sex: April 24, 1966 (54 y.o.  M) Treating RN: [name] Primary Care Provider: Waunita [name] Other Clinician: Referring Provider: Waunita [name] Treating Provider/Extender: [name] in Treatment: 3 Verbal / Phone Orders: No Diagnosis Coding Wound Cleansing Wound #1 Midline Sacrum o Clean wound with Normal Saline. Anesthetic (add to Medication List) Wound #1 Midline Sacrum o Topical Lidocaine 4% cream applied to wound bed prior to debridement (In Clinic Only). Skin Barriers/Peri-Wound Care Wound #1 Midline Sacrum o Skin Prep Primary Wound Dressing Wound #1 Midline Sacrum o Dry Gauze - Moistened with SIlversorb Gel (hydrogel in clinic) Secondary Dressing Wound #1 Midline Sacrum o Boardered Foam Dressing Dressing Change Frequency Wound #1 Midline Sacrum o Change Dressing Monday, Wednesday, Friday - Homehealth Follow-up Appointments Wound #1 Midline Sacrum o Return Appointment in 1 week. Off-Loading Wound #1 Midline Sacrum o Other: - NO Pressure on the wounded areas. Additional Orders / Instructions Wound #1 Midline Sacrum o Increase protein intake. - Powder protein supplement, multivitamin o Activity as tolerated Home Health Wound #1 Midline [place] Visits - [place] Nurse may visit PRN to address patientos wound care needs. o FACE TO FACE ENCOUNTER: MEDICARE and MEDICAID PATIENTS: I certify that this patient is under my care and that I had a face-to- face encounter that meets the physician face-to-face encounter requirements with this patient on this date. The encounter with the patient was in whole or in part for the following MEDICAL CONDITION: (primary reason for [place]) MEDICAL NECESSITY: I certify,  that based on my findings, NURSING services are a medically necessary home health service. HOME BOUND STATUS: I certify that my clinical findings support that this patient is homebound (i.e., Due to illness or injury, pt  requires aid of supportive devices such as crutches, cane, wheelchairs, walkers, the use of special transportation or the assistance of another person to leave their place of residence. There is a normal inability to leave the home and doing so requires considerable and taxing effort. Other absences are for medical reasons / religious services and are infrequent or of short duration when for other reasons). [name], [name] MANANSALA ([id number]) o If current dressing causes regression in wound condition, may D/C ordered dressing product/s and apply Normal Saline Moist Dressing daily until next [place] / Other MD appointment. [place] of regression in wound condition at [phone]. o Please direct any NON-WOUND related issues/requests for orders to patient's Primary Care Physician Negative Pressure Wound Therapy Wound #1 Midline Sacrum o Wound VAC settings at 125/130 mmHg continuous pressure. Use BLACK/GREEN foam to wound cavity. Use WHITE foam to fill any tunnel/s and/or undermining. Change VAC dressing 3 X WEEK. Change canister as indicated when full. Nurse may titrate settings and frequency of dressing changes as clinically indicated. - Home health to apply. o Home Health Nurse may d/c VAC for s/s of increased infection, significant wound regression, or uncontrolled drainage. [place] at [phone]. Electronic Signature(s) Signed: 09/26/2019 4:40:39 PM By: Gretta Cool, BSN, RN, CWS, Kim RN, BSN Signed: 09/26/2019 4:59:37 PM By: [name] Ham MD Entered By: Gretta Cool, BSN, RN, CWS, Kim on 09/26/2019 12:52:26 [name] ([id number]) -------------------------------------------------------------------------------- Problem List Details Patient Name: [name], [name] RAGIN. Date of Service: 09/26/2019 12:30 PM Medical Record Number: [id number] Patient Account Number: [id number] Date of Birth/Sex: 11/21/1965 (54 y.o. M) Treating RN: [name] Primary Care  Provider: Waunita [name] Other Clinician: Referring Provider: Waunita [name] Treating Provider/Extender: [name] in Treatment: 3 Active Problems ICD-10 Evaluated Encounter Code Description Active Date Today Diagnosis L89.154 Pressure ulcer of sacral region, stage 4 09/05/2019 No Yes T81.31XD Disruption of external operation (surgical) wound, not elsewhere classified, 09/05/2019 No Yes subsequent encounter L02.31 Cutaneous abscess of buttock 09/05/2019 No Yes Inactive Problems Resolved Problems Electronic Signature(s) Signed: 09/26/2019 4:59:37 PM By: [name] Ham MD Entered By: [name] on 09/26/2019 12:56:45 [place], [name] ([id number]) -------------------------------------------------------------------------------- Progress Note Details Patient Name: [name] Date of Service: 09/26/2019 12:30 PM Medical Record Number: [id number] Patient Account Number: [id number] Date of Birth/Sex: 06/18/1966 (55 y.o. M) Treating RN: [name] Primary Care Provider: Waunita [name] Other Clinician: Referring Provider: Waunita [name] Treating Provider/Extender: [name] in Treatment: 3 Subjective History of Present Illness (HPI) ADMISSION 09/05/2019 This is a 54 year old man who has a complicated history which started with acute low back pain sometime in September. Saw his primary doctor had a CT scan of the abdomen and pelvis that was normal. By late December he was discovered to have a lumbar epidural abscess with cauda equina syndrome. He was admitted to hospital and had a decompressive laminectomy at L3/L4/L5 blood cultures at the time showed MSSA he received IV nafcillin. He went to rehab from 1/5 through 1/15 there he was noted to have an "pressure injury in the skin" although I did not see much more about this in the discharge instructions. Unfortunately this wound became infected after he was discharged home. He required readmission from 1/25  through 2/2 with sepsis, discovered to have  a right psoas abscess as well as an infected decubitus ulcer. He had a bedside debridement by general surgery on 1/25 interventional radiology drain the right psoas abscess. General surgery recommended 3 times daily wet-to-dry dressings and an air mattress. He has since been discharged home. He is mobile with a walker. They are using 3 times daily wet-to-dry dressings. According to his family he is eating well. He has a protein supplement but he he is not taking it. He is currently on ampicillin sulbactam as recommended by Dr. Megan [name] this is due to be finished currently on 2/22 although there seem to be some suggestion it might go longer. They say he had blood work 2 days ago which I will review. The last lab work I saw was a sedimentation rate of 81 and a C-reactive protein of 212 on 1/25. I did not see any imaging studies of the underlying bone although I will need to review the CT scans that he has had Past medical history, polymyalgia rheumatica, left total hip replacement in 2018 for avascular necrosis, cellulitis of the scrotum in July 2020, history of alcohol abuse but that is not currently problematic 09/12/2019; patient readmitted to the clinic last week. I put him on a wet-to-dry dressing with silver sorb gel to the larger wound area on his coccyx and proximal right buttocks. These are actually connected. In general the wound surfaces look quite good although they are deep and there is a large amount of undermining. There is no exposed bone. Last lab work on 2/8 that was ordered by Dr. Megan [name] showed a CRP of 68.5 and a sedimentation rate of greater than 130. The CRP was elevated versus 18.3 on 1/10 although it is difficult to interpret these necessarily in somebody with polymyalgia rheumatica. His white count was 15.7 hemoglobin 10.7 The patient is offloading this is much as he can says that he is eating and drinking well which seems verified by  his family member. 2/24; patient with a large wound on the lower sacrum and proximal right buttocks. There is a bridge of overlying skin but the wounds are connected and there is wide undermining. We are using wet-to-dry dressings. I have reviewed Dr. Hale [name] notes of 09/17/2019 he is remove the PICC line. He had a prolonged course of IV antibiotics for the MSSA bacteremia, lumbar infection, psoas abscess and a necrotic sacral wound. Interestingly I noticed the same history of PMR in [place] link that Dr. Megan [name] comments on. I went over the case again with the patient and his daughter he is totally unfamiliar with anything to do with polymyalgia rheumatica or its obvious symptoms that I also described. I told him that this is not a diagnosis that most doctors would make and put in an E HR without documentation but he is just not familiar with it and neither his his daughter. For now we are using wet- to-dry dressings. I think it is time to try a wound VAC now that we are certain about underlying infection issues 3/3; he arrives today with his wound actually looks somewhat better. Two small areas with a bridge of skin actually have contracted I believe there is less depth. There is still extensive undermining. He got his VAC machine yesterday but they have still not put it on. Objective Constitutional Vitals Time Taken: 12:39 PM, Height: 72 in, Weight: 230 lbs, BMI: 31.2, Temperature: 98.8 F, Pulse: 100 bpm, Respiratory Rate: 16 breaths/min, Blood Pressure: 133/76 mmHg. Integumentary (Hair, Skin) Wound #1 status  is Open. Original cause of wound was Gradually Appeared. The wound is located on the Midline Sacrum. The wound measures 2cm length x 3cm width x 3.4cm depth; 4.712cm^2 area and 16.022cm^3 volume. There is muscle and Fat Layer (Subcutaneous Tissue) Exposed exposed. There is no tunneling or undermining noted. There is a medium amount of serosanguineous drainage noted. The wound margin  is thickened. There is large (67-100%) red granulation within the wound bed. There is a small (1-33%) amount of necrotic tissue within the wound bed including Ad[place] [name], [name] ([id number]) [place]. Assessment Active Problems ICD-10 Pressure ulcer of sacral region, stage 4 Disruption of external operation (surgical) wound, not elsewhere classified, subsequent encounter Cutaneous abscess of buttock Plan Wound Cleansing: Wound #1 Midline Sacrum: Clean wound with Normal Saline. Anesthetic (add to Medication List): Wound #1 Midline Sacrum: Topical Lidocaine 4% cream applied to wound bed prior to debridement (In Clinic Only). Skin Barriers/Peri-Wound Care: Wound #1 Midline Sacrum: Skin Prep Primary Wound Dressing: Wound #1 Midline Sacrum: Dry Gauze - Moistened with SIlversorb Gel (hydrogel in clinic) Secondary Dressing: Wound #1 Midline Sacrum: Boardered Foam Dressing Dressing Change Frequency: Wound #1 Midline Sacrum: Change Dressing Monday, Wednesday, Friday - Homehealth Follow-up Appointments: Wound #1 Midline Sacrum: Return Appointment in 1 week. Off-Loading: Wound #1 Midline Sacrum: Other: - NO Pressure on the wounded areas. Additional Orders / Instructions: Wound #1 Midline Sacrum: Increase protein intake. - Powder protein supplement, multivitamin Activity as tolerated Home Health: Wound #1 Midline Sacrum: Continue [facility] Health Visits - [facility] Nurse may visit PRN to address patient s wound care needs. FACE TO FACE ENCOUNTER: MEDICARE and MEDICAID PATIENTS: I certify that this patient is under my care and that I had a face-to-face encounter that meets the physician face-to-face encounter requirements with this patient on this date. The encounter with the patient was in whole or in part for the following MEDICAL CONDITION: (primary reason for [place]) MEDICAL NECESSITY: I certify, that based on my findings, NURSING services are a medically  necessary home health service. HOME BOUND STATUS: I certify that my clinical findings support that this patient is homebound (i.e., Due to illness or injury, pt requires aid of supportive devices such as crutches, cane, wheelchairs, walkers, the use of special transportation or the assistance of another person to leave their place of residence. There is a normal inability to leave the home and doing so requires considerable and taxing effort. Other absences are for medical reasons / religious services and are infrequent or of short duration when for other reasons). If current dressing causes regression in wound condition, may D/C ordered dressing product/s and apply Normal Saline Moist Dressing daily until next [place] / Other MD appointment. [place] of regression in wound condition at [phone]. Please direct any NON-WOUND related issues/requests for orders to patient's Primary Care Physician Negative Pressure Wound Therapy: Wound #1 Midline Sacrum: Wound VAC settings at 125/130 mmHg continuous pressure. Use BLACK/GREEN foam to wound cavity. Use WHITE foam to fill any tunnel/s and/or undermining. Change VAC dressing 3 X WEEK. Change canister as indicated when full. Nurse may titrate settings and frequency of dressing changes as clinically indicated. - Home health to apply. [place], BRYKER PULVER ([id number]) Home Health Nurse may d/c VAC for s/s of increased infection, significant wound regression, or uncontrolled drainage. [place] at [phone]. 1. We dressed this with a wet-to-dry with a border foam 2. They will place the wound [facility] tomorrow on this I had  like to see this next week but afterwards I think we can go to every second week especially if the wound VAC is effective. Electronic Signature(s) Signed: 09/26/2019 4:59:37 PM By: [name] Ham MD Entered By: [name] on 09/26/2019 12:59:36 [name]  ([id number]) -------------------------------------------------------------------------------- [place] Details Patient Name: Krab[place], [name] Date of Service: 09/26/2019 Medical Record Number: [id number] Patient Account Number: [id number] Date of Birth/Sex: 1966/02/06 (54 y.o. M) Treating RN: [name] Primary Care Provider: Waunita [name] Other Clinician: Referring Provider: Waunita [name] Treating Provider/Extender: [name] in Treatment: 3 Diagnosis Coding ICD-10 Codes Code Description L89.154 Pressure ulcer of sacral region, stage 4 T81.31XD Disruption of external operation (surgical) wound, not elsewhere classified, subsequent encounter L02.31 Cutaneous abscess of buttock Facility Procedures CPT4 Code: [id number] Description: 99213 - WOUND CARE VISIT-LEV 3 EST PT Modifier: Quantity: 1 Physician Procedures CPT4 Code Description: [id number] - WC PHYS LEVEL 3 - EST PT Modifier: Quantity: 1 CPT4 Code Description: ICD-10 Diagnosis Description L89.154 Pressure ulcer of sacral region, stage 4 T81.31XD Disruption of external operation (surgical) wound, not elsewhere classified Modifier: , subsequent encount Quantity: er Engineer, maintenance) Signed: 09/26/2019 4:59:37 PM By: [name] Ham MD Entered By: [name] on 09/26/2019 13:00:03

## 2019-09-26 NOTE — Progress Notes (Signed)
[name] ([id number]) Visit Report for 09/26/2019 Arrival Information Details Patient Name: [name], [name] Date of Service: 09/26/2019 12:30 PM Medical Record Number: [id number] Patient Account Number: [id number] Date of Birth/Sex: 01/04/1966 (54 y.o. M) Treating RN: [name] Primary Care [name]: [name] Other Clinician: Referring [name]: [name] Treating [name]/Extender: [name] in Treatment: 3 Visit Information History Since Last Visit Added or deleted any medications: No Patient Arrived: Walker Any new allergies or adverse reactions: No Arrival Time: 12:38 Had a fall or experienced change in No Accompanied By: daughter activities of daily living that may affect Transfer Assistance: None risk of falls: Patient Identification Verified: Yes Signs or symptoms of abuse/neglect since last visito No Hospitalized since last visit: No Has Dressing in Place as Prescribed: Yes Pain Present Now: No Electronic Signature(s) Signed: 09/26/2019 2:23:37 PM By: [name] Entered By: [name] on 09/26/2019 12:39:12 [name], [name] ([id number]) -------------------------------------------------------------------------------- Clinic Level of Care Assessment Details Patient Name: [name], [name] Date of Service: 09/26/2019 12:30 PM Medical Record Number: [id number] Patient Account Number: [id number] Date of Birth/Sex: 01/05/1966 (54 y.o. M) Treating RN: [name] Primary Care Dyshon Philbin: [name] Other Clinician: Referring [name]: [name] Treating Kelsie Zaborowski/Extender: [name] in Treatment: 3 Clinic Level of Care Assessment Items TOOL 4 Quantity Score []  - Use when only an EandM is performed on FOLLOW-UP visit 0 ASSESSMENTS - Nursing Assessment / Reassessment []  - Reassessment of Co-morbidities (includes updates in patient status) 0 X- 1 5 Reassessment of Adherence to Treatment Plan ASSESSMENTS - Wound and Skin Assessment /  Reassessment X - Simple Wound Assessment / Reassessment - one wound 1 5 []  - 0 Complex Wound Assessment / Reassessment - multiple wounds []  - 0 Dermatologic / Skin Assessment (not related to wound area) ASSESSMENTS - Focused Assessment []  - Circumferential Edema Measurements - multi extremities 0 []  - 0 Nutritional Assessment / Counseling / Intervention []  - 0 Lower Extremity Assessment (monofilament, tuning fork, pulses) []  - 0 Peripheral Arterial Disease Assessment (using hand held doppler) ASSESSMENTS - Ostomy and/or Continence Assessment and Care []  - Incontinence Assessment and Management 0 []  - 0 Ostomy Care Assessment and Management (repouching, etc.) PROCESS - Coordination of Care X - Simple Patient / Family Education for ongoing care 1 15 []  - 0 Complex (extensive) Patient / Family Education for ongoing care X- 1 10 Staff obtains Programmer, systems, Records, Test Results / Process Orders []  - 0 Staff telephones HHA, Nursing Homes / Clarify orders / etc []  - 0 Routine Transfer to another Facility (non-emergent condition) []  - 0 Routine Hospital Admission (non-emergent condition) []  - 0 New Admissions / Biomedical engineer / Ordering NPWT, Apligraf, etc. []  - 0 Emergency Hospital Admission (emergent condition) X- 1 10 Simple Discharge Coordination []  - 0 Complex (extensive) Discharge Coordination PROCESS - Special Needs []  - Pediatric / Minor Patient Management 0 []  - 0 Isolation Patient Management []  - 0 Hearing / Language / Visual special needs []  - 0 Assessment of Community assistance (transportation, D/C planning, etc.) [name], [name] ([id number]) []  - 0 Additional assistance / Altered mentation []  - 0 Support Surface(s) Assessment (bed, cushion, seat, etc.) INTERVENTIONS - Wound Cleansing / Measurement X - Simple Wound Cleansing - one wound 1 5 []  - 0 Complex Wound Cleansing - multiple wounds X- 1 5 Wound Imaging (photographs - any number of wounds) []   - 0 Wound Tracing (instead of photographs) X- 1 5 Simple Wound Measurement - one wound []  -  0 Complex Wound Measurement - multiple wounds INTERVENTIONS - Wound Dressings []  - Small Wound Dressing one or multiple wounds 0 X- 1 15 Medium Wound Dressing one or multiple wounds []  - 0 Large Wound Dressing one or multiple wounds []  - 0 Application of Medications - topical []  - 0 Application of Medications - injection INTERVENTIONS - Miscellaneous []  - External ear exam 0 []  - 0 Specimen Collection (cultures, biopsies, blood, body fluids, etc.) []  - 0 Specimen(s) / Culture(s) sent or taken to Lab for analysis []  - 0 Patient Transfer (multiple staff / Civil Service fast streamer / Similar devices) []  - 0 Simple Staple / Suture removal (25 or less) []  - 0 Complex Staple / Suture removal (26 or more) []  - 0 Hypo / Hyperglycemic Management (close monitor of Blood Glucose) []  - 0 Ankle / Brachial Index (ABI) - do not check if billed separately X- 1 5 Vital Signs Has the patient been seen at the hospital within the last three years: Yes Total Score: 80 Level Of Care: New/Established - Level 3 Electronic Signature(s) Signed: 09/26/2019 4:40:39 PM By: [name], BSN, RN, CWS, Kim RN, BSN Entered By: [name], BSN, RN, CWS, Kim on 09/26/2019 12:53:17 [name], [name] ([id number]) -------------------------------------------------------------------------------- Encounter Discharge Information Details Patient Name: [name], [name] [name] Date of Service: 09/26/2019 12:30 PM Medical Record Number: [id number] Patient Account Number: [id number] Date of Birth/Sex: 06/29/1966 (54 y.o. M) Treating RN: [name] Primary Care Aliscia Clayton: [name] Other Clinician: Referring Odette Watanabe: [name] Treating Yeiden Frenkel/Extender: [name] in Treatment: 3 Encounter Discharge Information Items Discharge Condition: Stable Ambulatory Status: Ambulatory Discharge Destination: Home Transportation: Private  Auto Accompanied By: daughter Schedule Follow-up Appointment: Yes Clinical Summary of Care: Electronic Signature(s) Signed: 09/26/2019 4:40:39 PM By: [name], BSN, RN, CWS, Kim RN, BSN Entered By: [name], BSN, RN, CWS, Kim on 09/26/2019 12:54:57 Dierks, [name] ([id number]) -------------------------------------------------------------------------------- Lower Extremity Assessment Details Patient Name: [name], [name] [name] Date of Service: 09/26/2019 12:30 PM Medical Record Number: [id number] Patient Account Number: [id number] Date of Birth/Sex: 04/17/1966 (54 y.o. M) Treating RN: [name] Primary Care Dary Dilauro: [name] Other Clinician: Referring Sequoyah Counterman: [name] Treating Kryslyn Helbig/Extender: Ricard Dillon Weeks in Treatment: 3 Electronic Signature(s) Signed: 09/26/2019 2:23:37 PM By: [name] Entered By: [name] on 09/26/2019 12:45:22 [name], [name] ([id number]) -------------------------------------------------------------------------------- Multi Wound Chart Details Patient Name: [name], [name] [name] Date of Service: 09/26/2019 12:30 PM Medical Record Number: [id number] Patient Account Number: [id number] Date of Birth/Sex: 01/11/1966 (54 y.o. M) Treating RN: [name] Primary Care Azalea Cedar: [name] Other Clinician: Referring Wylder Macomber: [name] Treating Dary Dilauro/Extender: [name] in Treatment: 3 Vital Signs Height(in): 72 Pulse(bpm): 100 Weight(lbs): 230 Blood Pressure(mmHg): 133/76 Body Mass Index(BMI): 31 Temperature(F): 98.8 Respiratory Rate(breaths/min): 16 Photos: [N/A:N/A] Wound Location: Sacrum - Midline N/A N/A Wounding Event: Gradually Appeared N/A N/A Primary Etiology: Abscess N/A N/A Date Acquired: 07/24/2019 N/A N/A Weeks of Treatment: 3 N/A N/A Wound Status: Open N/A N/A Measurements L x W x D (cm) 2x3x3.4 N/A N/A Area (cm) : 4.712 N/A N/A Volume (cm) : 16.022 N/A N/A % Reduction in Area: 43.10% N/A N/A % Reduction in  Volume: 57.00% N/A N/A Classification: Full Thickness With Exposed N/A N/A Support Structures Exudate Amount: Medium N/A N/A Exudate Type: Serosanguineous N/A N/A Exudate Color: red, brown N/A N/A Wound Margin: Thickened N/A N/A Granulation Amount: Large (67-100%) N/A N/A Granulation Quality: Red N/A N/A Necrotic Amount: Small (1-33%) N/A N/A Exposed Structures: Fat Layer (Subcutaneous Tissue) N/A N/A Exposed: Yes  Muscle: Yes Fascia: No Tendon: No Joint: No Bone: No Epithelialization: None N/A N/A Treatment Notes Wound #1 (Midline Sacrum) 8. Negative Pressure Wound Therapy Wound Vac to wound continuously at 146mm/hg pressure Black and White Foam combination Notes Wet to dry with hydrogel; bordered foam dressing. NPWT to be applied by home health. Biscoe, ALGER ASTON ([id number]) Electronic Signature(s) Signed: 09/26/2019 4:59:37 PM By: Linton Ham MD Entered By: Linton Ham on 09/26/2019 12:56:58 Lazalde, [name] ([id number]) -------------------------------------------------------------------------------- [name] Details Patient Name: [name], [name] Date of Service: 09/26/2019 12:30 PM Medical Record Number: [id number] Patient Account Number: [id number] Date of Birth/Sex: [birth year] (54 y.o. M) Treating RN: [name] Primary Care Abbygale Lapid: [name] Other Clinician: Referring Jachob Mcclean: [name] Treating Kileigh Ortmann/Extender: [name] in Treatment: 3 Active Inactive Necrotic Tissue Nursing Diagnoses: Impaired tissue integrity related to necrotic/devitalized tissue Goals: Necrotic/devitalized tissue will be minimized in the wound bed Date Initiated: 09/05/2019 Target Resolution Date: 09/26/2019 Goal Status: Active Interventions: Assess patient pain level pre-, during and post procedure and prior to discharge Treatment Activities: Apply topical anesthetic as ordered : 09/05/2019 Notes: Orientation to the Wound Care  Program Nursing Diagnoses: Knowledge deficit related to the wound healing center program Goals: Patient/caregiver will verbalize understanding of the [facility] Date Initiated: 09/05/2019 Target Resolution Date: 09/26/2019 Goal Status: Active Interventions: Provide education on orientation to the wound center Notes: Pressure Nursing Diagnoses: Knowledge deficit related to management of pressures ulcers Goals: Patient will remain free from development of additional pressure ulcers Date Initiated: 09/05/2019 Target Resolution Date: 10/03/2019 Goal Status: Active Patient/caregiver will verbalize risk factors for pressure ulcer development Date Initiated: 09/05/2019 Target Resolution Date: 09/26/2019 Goal Status: Active Patient/caregiver will verbalize understanding of pressure ulcer management Date Initiated: 09/05/2019 Target Resolution Date: 10/03/2019 Goal Status: Active Interventions: Begley, [name] ([id number]) Assess: immobility, friction, shearing, incontinence upon admission and as needed Provide education on pressure ulcers Notes: Wound/Skin Impairment Nursing Diagnoses: Impaired tissue integrity Goals: Patient/caregiver will verbalize understanding of skin care regimen Date Initiated: 09/05/2019 Target Resolution Date: 09/26/2019 Goal Status: Active Ulcer/skin breakdown will have a volume reduction of 30% by week 4 Date Initiated: 09/05/2019 Target Resolution Date: 10/03/2019 Goal Status: Active Interventions: Assess ulceration(s) every visit Treatment Activities: Referred to DME Abuk Selleck for dressing supplies : 09/05/2019 Notes: Electronic Signature(s) Signed: 09/26/2019 4:40:39 PM By: [name], BSN, RN, CWS, Kim RN, BSN Entered By: [name], BSN, RN, CWS, Kim on 09/26/2019 12:51:03 [name], [name] ([id number]) -------------------------------------------------------------------------------- Pain Assessment Details Patient Name: [name], [name] [name] Date of Service:  09/26/2019 12:30 PM Medical Record Number: [id number] Patient Account Number: [id number] Date of Birth/Sex: 1966/03/19 (54 y.o. M) Treating RN: [name] Primary Care Ileta Ofarrell: [name] Other Clinician: Referring Cleatus Gabriel: [name] Treating Lakenya Riendeau/Extender: [name] in Treatment: 3 Active Problems Location of Pain Severity and Description of Pain Patient Has Paino No Site Locations Pain Management and Medication Current Pain Management: Electronic Signature(s) Signed: 09/26/2019 2:23:37 PM By: [name] Entered By: [name] on 09/26/2019 12:39:38 Salguero, [name] ([id number]) -------------------------------------------------------------------------------- Patient/Caregiver Education Details Patient Name: Elizarraraz, [name] Date of Service: 09/26/2019 12:30 PM Medical Record Number: [id number] Patient Account Number: [id number] Date of Birth/Gender: 02-16-1966 (54 y.o. M) Treating RN: [name] Primary Care Physician: [name] Other Clinician: Referring Physician: Waunita [name] Treating Physician/Extender: [name] in Treatment: 3 Education Assessment Education Provided To: Patient Education Topics Provided Pressure: Handouts: Preventing Pressure Ulcers Methods: Demonstration, Explain/Verbal Responses: State content correctly Wound/Skin Impairment: Handouts: Caring  for Your Ulcer Methods: Demonstration, Explain/Verbal Responses: State content correctly Electronic Signature(s) Signed: 09/26/2019 4:40:39 PM By: [name], BSN, RN, CWS, Kim RN, BSN Entered By: [name], BSN, RN, CWS, Kim on 09/26/2019 12:53:45 Daugherty, [name] ([id number]) -------------------------------------------------------------------------------- Wound Assessment Details Patient Name: Viens, Andron R. Date of Service: 09/26/2019 12:30 PM Medical Record Number: [id number] Patient Account Number: [id number] Date of Birth/Sex: 12/30/1965 (54 y.o. M) Treating RN: [name] Primary Care Diana Davenport: [name] Other Clinician: Referring Kacee Koren: [name] Treating Chasya Zenz/Extender: [name] in Treatment: 3 Wound Status Wound Number: 1 Primary Etiology: Abscess Wound Location: Sacrum - Midline Wound Status: Open Wounding Event: Gradually Appeared Date Acquired: 07/24/2019 Weeks Of Treatment: 3 Clustered Wound: No Photos Wound Measurements Length: (cm) 2 Width: (cm) 3 Depth: (cm) 3.4 Area: (cm) 4.712 Volume: (cm) 16.022 % Reduction in Area: 43.1% % Reduction in Volume: 57% Epithelialization: None Tunneling: No Undermining: No Wound Description Classification: Full Thickness With Exposed Support Structures Wound Margin: Thickened Exudate Amount: Medium Exudate Type: Serosanguineous Exudate Color: red, brown Foul Odor After Cleansing: No Slough/Fibrino Yes Wound Bed Granulation Amount: Large (67-100%) Exposed Structure Granulation Quality: Red Fascia Exposed: No Necrotic Amount: Small (1-33%) Fat Layer (Subcutaneous Tissue) Exposed: Yes Necrotic Quality: Adherent Slough Tendon Exposed: No Muscle Exposed: Yes Necrosis of Muscle: No Joint Exposed: No Bone Exposed: No Treatment Notes Wound #1 (Midline Sacrum) 8. Negative Pressure Wound Therapy Wound Vac to wound continuously at 124mm/hg pressure Black and White Foam combination Notes Gleghorn, HANK COCKCROFT. ([id number]) Wet to dry with hydrogel; bordered foam dressing. NPWT to be applied by home health. Electronic Signature(s) Signed: 09/26/2019 2:23:37 PM By: [name] Entered By: [name] on 09/26/2019 12:45:09 Pogosyan, [name] ([id number]) -------------------------------------------------------------------------------- Vitals Details Patient Name: Blumstein, [name] Date of Service: 09/26/2019 12:30 PM Medical Record Number: [id number] Patient Account Number: [id number] Date of Birth/Sex: [birth year] (54 y.o. M) Treating RN: [name] Primary Care Westin Knotts:  [name] Other Clinician: Referring Ludella Pranger: [name] Treating Jahzier Villalon/Extender: [name] in Treatment: 3 Vital Signs Time Taken: 12:39 Temperature (F): 98.8 Height (in): 72 Pulse (bpm): 100 Weight (lbs): 230 Respiratory Rate (breaths/min): 16 Body Mass Index (BMI): 31.2 Blood Pressure (mmHg): 133/76 Reference Range: 80 - 120 mg / dl Electronic Signature(s) Signed: 09/26/2019 2:23:37 PM By: [name] Entered By: [name] on 09/26/2019 12:39:33

## 2019-09-27 DIAGNOSIS — G834 Cauda equina syndrome: Secondary | ICD-10-CM | POA: Diagnosis not present

## 2019-09-27 DIAGNOSIS — K6812 Psoas muscle abscess: Secondary | ICD-10-CM | POA: Diagnosis not present

## 2019-09-27 DIAGNOSIS — L89312 Pressure ulcer of right buttock, stage 2: Secondary | ICD-10-CM | POA: Diagnosis not present

## 2019-09-27 DIAGNOSIS — T8142XA Infection following a procedure, deep incisional surgical site, initial encounter: Secondary | ICD-10-CM | POA: Diagnosis not present

## 2019-09-27 DIAGNOSIS — M4646 Discitis, unspecified, lumbar region: Secondary | ICD-10-CM | POA: Diagnosis not present

## 2019-09-27 DIAGNOSIS — M4316 Spondylolisthesis, lumbar region: Secondary | ICD-10-CM | POA: Diagnosis not present

## 2019-09-27 DIAGNOSIS — I081 Rheumatic disorders of both mitral and tricuspid valves: Secondary | ICD-10-CM | POA: Diagnosis not present

## 2019-09-27 DIAGNOSIS — M48061 Spinal stenosis, lumbar region without neurogenic claudication: Secondary | ICD-10-CM | POA: Diagnosis not present

## 2019-09-27 DIAGNOSIS — M47816 Spondylosis without myelopathy or radiculopathy, lumbar region: Secondary | ICD-10-CM | POA: Diagnosis not present

## 2019-09-27 DIAGNOSIS — M4804 Spinal stenosis, thoracic region: Secondary | ICD-10-CM | POA: Diagnosis not present

## 2019-09-27 DIAGNOSIS — B9561 Methicillin susceptible Staphylococcus aureus infection as the cause of diseases classified elsewhere: Secondary | ICD-10-CM | POA: Diagnosis not present

## 2019-09-27 DIAGNOSIS — M5144 Schmorl's nodes, thoracic region: Secondary | ICD-10-CM | POA: Diagnosis not present

## 2019-09-27 DIAGNOSIS — M4604 Spinal enthesopathy, thoracic region: Secondary | ICD-10-CM | POA: Diagnosis not present

## 2019-09-27 DIAGNOSIS — R7881 Bacteremia: Secondary | ICD-10-CM | POA: Diagnosis not present

## 2019-09-27 DIAGNOSIS — L89154 Pressure ulcer of sacral region, stage 4: Secondary | ICD-10-CM | POA: Diagnosis not present

## 2019-09-27 DIAGNOSIS — G061 Intraspinal abscess and granuloma: Secondary | ICD-10-CM | POA: Diagnosis not present

## 2019-09-27 DIAGNOSIS — L89894 Pressure ulcer of other site, stage 4: Secondary | ICD-10-CM | POA: Diagnosis not present

## 2019-09-28 DIAGNOSIS — G834 Cauda equina syndrome: Secondary | ICD-10-CM | POA: Diagnosis not present

## 2019-09-28 DIAGNOSIS — G061 Intraspinal abscess and granuloma: Secondary | ICD-10-CM | POA: Diagnosis not present

## 2019-09-28 DIAGNOSIS — M4604 Spinal enthesopathy, thoracic region: Secondary | ICD-10-CM | POA: Diagnosis not present

## 2019-09-28 DIAGNOSIS — T8142XA Infection following a procedure, deep incisional surgical site, initial encounter: Secondary | ICD-10-CM | POA: Diagnosis not present

## 2019-09-28 DIAGNOSIS — R7881 Bacteremia: Secondary | ICD-10-CM | POA: Diagnosis not present

## 2019-09-28 DIAGNOSIS — L89894 Pressure ulcer of other site, stage 4: Secondary | ICD-10-CM | POA: Diagnosis not present

## 2019-09-28 DIAGNOSIS — M48061 Spinal stenosis, lumbar region without neurogenic claudication: Secondary | ICD-10-CM | POA: Diagnosis not present

## 2019-09-28 DIAGNOSIS — M4646 Discitis, unspecified, lumbar region: Secondary | ICD-10-CM | POA: Diagnosis not present

## 2019-09-28 DIAGNOSIS — M47816 Spondylosis without myelopathy or radiculopathy, lumbar region: Secondary | ICD-10-CM | POA: Diagnosis not present

## 2019-09-28 DIAGNOSIS — I081 Rheumatic disorders of both mitral and tricuspid valves: Secondary | ICD-10-CM | POA: Diagnosis not present

## 2019-09-28 DIAGNOSIS — M4316 Spondylolisthesis, lumbar region: Secondary | ICD-10-CM | POA: Diagnosis not present

## 2019-09-28 DIAGNOSIS — L89312 Pressure ulcer of right buttock, stage 2: Secondary | ICD-10-CM | POA: Diagnosis not present

## 2019-09-28 DIAGNOSIS — M5144 Schmorl's nodes, thoracic region: Secondary | ICD-10-CM | POA: Diagnosis not present

## 2019-09-28 DIAGNOSIS — B9561 Methicillin susceptible Staphylococcus aureus infection as the cause of diseases classified elsewhere: Secondary | ICD-10-CM | POA: Diagnosis not present

## 2019-09-28 DIAGNOSIS — L89154 Pressure ulcer of sacral region, stage 4: Secondary | ICD-10-CM | POA: Diagnosis not present

## 2019-09-28 DIAGNOSIS — K6812 Psoas muscle abscess: Secondary | ICD-10-CM | POA: Diagnosis not present

## 2019-09-28 DIAGNOSIS — M4804 Spinal stenosis, thoracic region: Secondary | ICD-10-CM | POA: Diagnosis not present

## 2019-09-28 NOTE — Progress Notes (Signed)
[name] ([id number]) Visit Report for 09/19/2019 Arrival Information Details Patient Name: [name], [name] Date of Service: 09/19/2019 10:30 AM Medical Record Number: [id number] Patient Account Number: [id number] Date of Birth/Sex: 06/18/1966 (54 y.o. M) Treating RN: [name] Primary Care [name]: [name] Other Clinician: Referring Bernadene Garside: [name] Treating [name]/Extender: [name] in Treatment: 2 Visit Information History Since Last Visit Added or deleted any medications: No Patient Arrived: [name] Any new allergies or adverse reactions: No Arrival Time: 10:58 Had a fall or experienced change in No Accompanied By: daughter activities of daily living that may affect Transfer Assistance: None risk of falls: Patient Identification Verified: Yes Signs or symptoms of abuse/neglect since last visito No Hospitalized since last visit: No Has Dressing in Place as Prescribed: Yes Pain Present Now: No Electronic Signature(s) Signed: 09/19/2019 4:52:55 PM By: [name] Entered By: [name] on 09/19/2019 10:58:37 [name] ([id number]) -------------------------------------------------------------------------------- Clinic Level of Care Assessment Details Patient Name: [name] Date of Service: 09/19/2019 10:30 AM Medical Record Number: [id number] Patient Account Number: [id number] Date of Birth/Sex: 06/13/1966 (54 y.o. M) Treating RN: [name] Primary Care Giovannina Mun: [name] Other Clinician: Referring Braxley Balandran: [name] Treating Ailani Governale/Extender: [name] in Treatment: 2 Clinic Level of Care Assessment Items TOOL 4 Quantity Score []  - Use when only an EandM is performed on FOLLOW-UP visit 0 ASSESSMENTS - Nursing Assessment / Reassessment X - Reassessment of Co-morbidities (includes updates in patient status) 1 10 X- 1 5 Reassessment of Adherence to Treatment Plan ASSESSMENTS - Wound and Skin  Assessment / Reassessment X - Simple Wound Assessment / Reassessment - one wound 1 5 []  - 0 Complex Wound Assessment / Reassessment - multiple wounds []  - 0 Dermatologic / Skin Assessment (not related to wound area) ASSESSMENTS - Focused Assessment []  - Circumferential Edema Measurements - multi extremities 0 []  - 0 Nutritional Assessment / Counseling / Intervention []  - 0 Lower Extremity Assessment (monofilament, tuning fork, pulses) []  - 0 Peripheral Arterial Disease Assessment (using hand held doppler) ASSESSMENTS - Ostomy and/or Continence Assessment and Care []  - Incontinence Assessment and Management 0 []  - 0 Ostomy Care Assessment and Management (repouching, etc.) PROCESS - Coordination of Care X - Simple Patient / Family Education for ongoing care 1 15 []  - 0 Complex (extensive) Patient / Family Education for ongoing care X- 1 10 Staff obtains Programmer, systems, Records, Test Results / Process Orders []  - 0 Staff telephones HHA, Nursing Homes / Clarify orders / etc []  - 0 Routine Transfer to another Facility (non-emergent condition) []  - 0 Routine Hospital Admission (non-emergent condition) []  - 0 New Admissions / Biomedical engineer / Ordering NPWT, Apligraf, etc. []  - 0 Emergency Hospital Admission (emergent condition) X- 1 10 Simple Discharge Coordination []  - 0 Complex (extensive) Discharge Coordination PROCESS - Special Needs []  - Pediatric / Minor Patient Management 0 []  - 0 Isolation Patient Management []  - 0 Hearing / Language / Visual special needs []  - 0 Assessment of Community assistance (transportation, D/C planning, etc.) [name], [name] ([id number]) []  - 0 Additional assistance / Altered mentation []  - 0 Support Surface(s) Assessment (bed, cushion, seat, etc.) INTERVENTIONS - Wound Cleansing / Measurement X - Simple Wound Cleansing - one wound 1 5 []  - 0 Complex Wound Cleansing - multiple wounds X- 1 5 Wound Imaging (photographs - any number of  wounds) []  - 0 Wound Tracing (instead of photographs) X- 1 5 Simple Wound Measurement - one wound []  -  0 Complex Wound Measurement - multiple wounds INTERVENTIONS - Wound Dressings []  - Small Wound Dressing one or multiple wounds 0 []  - 0 Medium Wound Dressing one or multiple wounds X- 1 20 Large Wound Dressing one or multiple wounds []  - 0 Application of Medications - topical []  - 0 Application of Medications - injection INTERVENTIONS - Miscellaneous []  - External ear exam 0 []  - 0 Specimen Collection (cultures, biopsies, blood, body fluids, etc.) []  - 0 Specimen(s) / Culture(s) sent or taken to Lab for analysis []  - 0 Patient Transfer (multiple staff / Civil Service fast streamer / Similar devices) []  - 0 Simple Staple / Suture removal (25 or less) []  - 0 Complex Staple / Suture removal (26 or more) []  - 0 Hypo / Hyperglycemic Management (close monitor of Blood Glucose) []  - 0 Ankle / Brachial Index (ABI) - do not check if billed separately X- 1 5 Vital Signs Has the patient been seen at the hospital within the last three years: Yes Total Score: 95 Level Of Care: New/Established - Level 3 Electronic Signature(s) Signed: 09/28/2019 5:40:44 PM By: [name], BSN, RN, CWS, Kim RN, BSN Entered By: [name], BSN, RN, CWS, [name] on 09/19/2019 11:23:18 [name] ([id number]) -------------------------------------------------------------------------------- Encounter Discharge Information Details Patient Name: [name], [name] Date of Service: 09/19/2019 10:30 AM Medical Record Number: [id number] Patient Account Number: [id number] Date of Birth/Sex: 10/12/1965 (54 y.o. M) Treating RN: [name] Primary Care [name]: [name] Other Clinician: Referring [name]: [name] Treating [name]/Extender: [name] in Treatment: 2 Encounter Discharge Information Items Discharge Condition: Stable Ambulatory Status: [name] Discharge Destination: Home Transportation: Private  Auto Accompanied By: daughter Schedule Follow-up Appointment: Yes Clinical Summary of Care: Electronic Signature(s) Signed: 09/28/2019 5:40:44 PM By: [name], BSN, RN, CWS, Kim RN, BSN Entered By: [name], BSN, RN, CWS, [name] on 09/19/2019 11:24:36 Averitt, Candelaria [name] ([id number]) -------------------------------------------------------------------------------- Lower Extremity Assessment Details Patient Name: Bither, [name] Date of Service: 09/19/2019 10:30 AM Medical Record Number: [id number] Patient Account Number: [id number] Date of Birth/Sex: 04/18/1966 (54 y.o. M) Treating RN: [name] Primary Care Nykeria Mealing: [name] Other Clinician: Referring Charyl Minervini: [name] Treating Frannie Shedrick/Extender: [name] in Treatment: 2 Electronic Signature(s) Signed: 09/19/2019 11:58:29 AM By: [name] BSN, RN, CWS, Kim RN, BSN Signed: 09/19/2019 4:52:55 PM By: [name] Entered By: [name] BSN, RN, CWS, [name] on 09/19/2019 11:58:29 Marlett, Candelaria [name] ([id number]) -------------------------------------------------------------------------------- Multi Wound Chart Details Patient Name: Campanile, [name] Date of Service: 09/19/2019 10:30 AM Medical Record Number: [id number] Patient Account Number: [id number] Date of Birth/Sex: 10/15/1965 (54 y.o. M) Treating RN: [name] Primary Care Lateria Alderman: [name] Other Clinician: Referring Jatniel Verastegui: [name] Treating Dossie Ocanas/Extender: [name] in Treatment: 2 Vital Signs Height(in): 72 Pulse(bpm): 102 Weight(lbs): 230 Blood Pressure(mmHg): 138/81 Body Mass Index(BMI): 31 Temperature(F): 98.9 Respiratory Rate(breaths/min): 16 Photos: [N/A:N/A] Wound Location: Sacrum - Midline N/A N/A Wounding Event: Gradually Appeared N/A N/A Primary Etiology: Abscess N/A N/A Date Acquired: 07/24/2019 N/A N/A Weeks of Treatment: 2 N/A N/A Wound Status: Open N/A N/A Measurements L x W x D (cm) 2.6x3.5x4.8 N/A N/A Area (cm) : 7.147 N/A  N/A Volume (cm) : 34.306 N/A N/A % Reduction in Area: 13.70% N/A N/A % Reduction in Volume: 7.90% N/A N/A Position 1 (o'clock): 1 Maximum Distance 1 (cm): 4 Tunneling: Yes N/A N/A Classification: Full Thickness With Exposed N/A N/A Support Structures Exudate Amount: Medium N/A N/A Exudate Type: Serosanguineous N/A N/A Exudate Color: red, brown N/A N/A Wound Margin: Thickened N/A  N/A Granulation Amount: Large (67-100%) N/A N/A Granulation Quality: Red N/A N/A Necrotic Amount: Small (1-33%) N/A N/A Exposed Structures: Fat Layer (Subcutaneous Tissue) N/A N/A Exposed: Yes Muscle: Yes Fascia: No Tendon: No Joint: No Bone: No Epithelialization: None N/A N/A Treatment Notes Wound #1 (Midline Sacrum) Notes Wet to dry with hydrogel; bordered foam dressing Popoff, KENNITH ABILA ([id number]) Electronic Signature(s) Signed: 09/19/2019 11:59:50 AM By: [name], BSN, RN, CWS, Kim RN, BSN Entered By: [name], BSN, RN, CWS, [name] on 09/19/2019 11:59:49 Kimbler, Candelaria [name] ([id number]) -------------------------------------------------------------------------------- [place] Details Patient Name: Ure, Candelaria [name]. Date of Service: 09/19/2019 10:30 AM Medical Record Number: [id number] Patient Account Number: [id number] Date of Birth/Sex: 11/30/1965 (54 y.o. M) Treating RN: [name] Primary Care Leylah Tarnow: [name] Other Clinician: Referring Naureen Benton: [name] Treating Lizandra Zakrzewski/Extender: [name] in Treatment: 2 Active Inactive Necrotic Tissue Nursing Diagnoses: Impaired tissue integrity related to necrotic/devitalized tissue Goals: Necrotic/devitalized tissue will be minimized in the wound bed Date Initiated: 09/05/2019 Target Resolution Date: 09/26/2019 Goal Status: Active Interventions: Assess patient pain level pre-, during and post procedure and prior to discharge Treatment Activities: Apply topical anesthetic as ordered : 09/05/2019 Notes: Orientation to  the Wound Care Program Nursing Diagnoses: Knowledge deficit related to the wound healing center program Goals: Patient/caregiver will verbalize understanding of the [place] Date Initiated: 09/05/2019 Target Resolution Date: 09/26/2019 Goal Status: Active Interventions: Provide education on orientation to the wound center Notes: Pressure Nursing Diagnoses: Knowledge deficit related to management of pressures ulcers Goals: Patient will remain free from development of additional pressure ulcers Date Initiated: 09/05/2019 Target Resolution Date: 10/03/2019 Goal Status: Active Patient/caregiver will verbalize risk factors for pressure ulcer development Date Initiated: 09/05/2019 Target Resolution Date: 09/26/2019 Goal Status: Active Patient/caregiver will verbalize understanding of pressure ulcer management Date Initiated: 09/05/2019 Target Resolution Date: 10/03/2019 Goal Status: Active Interventions: Schnoor, RAYLIN BUTLAND ([id number]) Assess: immobility, friction, shearing, incontinence upon admission and as needed Provide education on pressure ulcers Notes: Wound/Skin Impairment Nursing Diagnoses: Impaired tissue integrity Goals: Patient/caregiver will verbalize understanding of skin care regimen Date Initiated: 09/05/2019 Target Resolution Date: 09/26/2019 Goal Status: Active Ulcer/skin breakdown will have a volume reduction of 30% by week 4 Date Initiated: 09/05/2019 Target Resolution Date: 10/03/2019 Goal Status: Active Interventions: Assess ulceration(s) every visit Treatment Activities: Referred to DME Kalib Bhagat for dressing supplies : 09/05/2019 Notes: Electronic Signature(s) Signed: 09/19/2019 11:59:39 AM By: [name], BSN, RN, CWS, Kim RN, BSN Entered By: [name], BSN, RN, CWS, [name] on 09/19/2019 11:59:39 Castelli, Candelaria [name] ([id number]) -------------------------------------------------------------------------------- Pain Assessment Details Patient Name: Schulenburg, Barnabas Lister  R. Date of Service: 09/19/2019 10:30 AM Medical Record Number: [id number] Patient Account Number: [id number] Date of Birth/Sex: 10/06/1965 (54 y.o. M) Treating RN: [name] Primary Care Aadvik Roker: [name] Other Clinician: Referring Raejean Swinford: [name] Treating Naleyah Ohlinger/Extender: Ricard Dillon Weeks in Treatment: 2 Active Problems Location of Pain Severity and Description of Pain Patient Has Paino No Site Locations Pain Management and Medication Current Pain Management: Electronic Signature(s) Signed: 09/19/2019 4:52:55 PM By: [name] Entered By: [name] on 09/19/2019 10:59:17 Mansel, Candelaria [name] ([id number]) -------------------------------------------------------------------------------- Patient/Caregiver Education Details Patient Name: Romano, Candelaria [name]. Date of Service: 09/19/2019 10:30 AM Medical Record Number: [id number] Patient Account Number: [id number] Date of Birth/Gender: 05/15/1966 (54 y.o. M) Treating RN: [name] Primary Care Physician: [name] Other Clinician: Referring Physician: Waunita [name] Treating Physician/Extender: [name] in Treatment: 2 Education Assessment Education Provided To: Patient Education Topics Provided Pressure: Handouts: Pressure Ulcers: Care and  Offloading Methods: Demonstration, Explain/Verbal Responses: State content correctly Wound/Skin Impairment: Handouts: Caring for Your Ulcer Methods: Demonstration, Explain/Verbal Responses: State content correctly Electronic Signature(s) Signed: 09/28/2019 5:40:44 PM By: [name], BSN, RN, CWS, Kim RN, BSN Entered By: [name], BSN, RN, CWS, [name] on 09/19/2019 11:23:52 Augello, Candelaria [name] ([id number]) -------------------------------------------------------------------------------- Wound Assessment Details Patient Name: Michalsky, [name] Date of Service: 09/19/2019 10:30 AM Medical Record Number: [id number] Patient Account Number: [id number] Date of Birth/Sex: 11/20/1965  (54 y.o. M) Treating RN: [name] Primary Care Elana Jian: [name] Other Clinician: Referring Christal Lagerstrom: [name] Treating Titania Gault/Extender: [name] in Treatment: 2 Wound Status Wound Number: 1 Primary Etiology: Abscess Wound Location: Sacrum - Midline Wound Status: Open Wounding Event: Gradually Appeared Date Acquired: 07/24/2019 Weeks Of Treatment: 2 Clustered Wound: No Photos Wound Measurements Length: (cm) 2.6 Width: (cm) 3.5 Depth: (cm) 4.8 Area: (cm) 7.147 Volume: (cm) 34.306 % Reduction in Area: 13.7% % Reduction in Volume: 7.9% Epithelialization: None Tunneling: Yes Position (o'clock): 1 Maximum Distance: (cm) 4 Undermining: No Wound Description Classification: Full Thickness With Exposed Support Structures Wound Margin: Thickened Exudate Amount: Medium Exudate Type: Serosanguineous Exudate Color: red, brown Foul Odor After Cleansing: No Slough/Fibrino Yes Wound Bed Granulation Amount: Large (67-100%) Exposed Structure Granulation Quality: Red Fascia Exposed: No Necrotic Amount: Small (1-33%) Fat Layer (Subcutaneous Tissue) Exposed: Yes Necrotic Quality: Adherent Slough Tendon Exposed: No Muscle Exposed: Yes Necrosis of Muscle: No Joint Exposed: No Bone Exposed: No Electronic Signature(s) Signed: 09/19/2019 11:58:22 AM By: [name], BSN, RN, CWS, Kim RN, BSN Entered By: [name], BSN, RN, CWS, [name] on 09/19/2019 11:58:22 Caporale, Candelaria [name] ([id number]) Keil, Candelaria [name] ([id number]) -------------------------------------------------------------------------------- Vitals Details Patient Name: Wallis, Candelaria [name]. Date of Service: 09/19/2019 10:30 AM Medical Record Number: [id number] Patient Account Number: [id number] Date of Birth/Sex: 11/30/1965 (54 y.o. M) Treating RN: [name] Primary Care Ngai Parcell: [name] Other Clinician: Referring Avaleen Brownley: [name] Treating Tajanae Guilbault/Extender: [name] in Treatment:  2 Vital Signs Time Taken: 10:58 Temperature (F): 98.9 Height (in): 72 Pulse (bpm): 102 Weight (lbs): 230 Respiratory Rate (breaths/min): 16 Body Mass Index (BMI): 31.2 Blood Pressure (mmHg): 138/81 Reference Range: 80 - 120 mg / dl Electronic Signature(s) Signed: 09/19/2019 4:52:55 PM By: [name] Entered By: [name] on 09/19/2019 10:59:00

## 2019-09-28 NOTE — Progress Notes (Signed)
[name] ([id number]) Visit Report for 09/19/2019 HPI Details Patient Name: [name], [name] Date of Service: 09/19/2019 10:30 AM Medical Record Number: [id number] Patient Account Number: [id number] Date of Birth/Sex: 04/22/1966 (54 y.o. M) Treating RN: [name] Primary Care Provider: [name] Other Clinician: Referring Provider: [name] Treating Provider/Extender: [name] in Treatment: 2 History of Present Illness HPI Description: ADMISSION 09/05/2019 This is a 54 year old man who has a complicated history which started with acute low back pain sometime in September. Saw his primary doctor had a CT scan of the abdomen and pelvis that was normal. By late December he was discovered to have a lumbar epidural abscess with cauda equina syndrome. He was admitted to hospital and had a decompressive laminectomy at L3/L4/L5 blood cultures at the time showed MSSA he received IV nafcillin. He went to rehab from 1/5 through 1/15 there he was noted to have an "pressure injury in the skin" although I did not see much more about this in the discharge instructions. Unfortunately this wound became infected after he was discharged home. He required readmission from 1/25 through 2/2 with sepsis, discovered to have a right psoas abscess as well as an infected decubitus ulcer. He had a bedside debridement by general surgery on 1/25 interventional radiology drain the right psoas abscess. General surgery recommended 3 times daily wet-to-dry dressings and an air mattress. He has since been discharged home. He is mobile with a walker. They are using 3 times daily wet-to-dry dressings. According to his family he is eating well. He has a protein supplement but he he is not taking it. He is currently on ampicillin sulbactam as recommended by [name] this is due to be finished currently on 2/22 although there seem to be some suggestion it might go longer. They say he had blood work 2  days ago which I will review. The last lab work I saw was a sedimentation rate of 81 and a C-reactive protein of 212 on 1/25. I did not see any imaging studies of the underlying bone although I will need to review the CT scans that he has had Past medical history, polymyalgia rheumatica, left total hip replacement in 2018 for avascular necrosis, cellulitis of the scrotum in July 2020, history of alcohol abuse but that is not currently problematic 09/12/2019; patient readmitted to the clinic last week. I put him on a wet-to-dry dressing with silver sorb gel to the larger wound area on his coccyx and proximal right buttocks. These are actually connected. In general the wound surfaces look quite good although they are deep and there is a large amount of undermining. There is no exposed bone. Last lab work on 2/8 that was ordered by [name] showed a CRP of 68.5 and a sedimentation rate of greater than 130. The CRP was elevated versus 18.3 on 1/10 although it is difficult to interpret these necessarily in somebody with polymyalgia rheumatica. His white count was 15.7 hemoglobin 10.7 The patient is offloading this is much as he can says that he is eating and drinking well which seems verified by his family member. 2/24; patient with a large wound on the lower sacrum and proximal right buttocks. There is a bridge of overlying skin but the wounds are connected and there is wide undermining. We are using wet-to-dry dressings. I have reviewed [name] notes of 09/17/2019 he is remove the PICC line. He had a prolonged course of IV antibiotics for the MSSA bacteremia, lumbar infection, psoas  abscess and a necrotic sacral wound. Interestingly I noticed the same history of PMR in [place] link that [name] comments on. I went over the case again with the patient and his daughter he is totally unfamiliar with anything to do with polymyalgia rheumatica or its obvious symptoms that I also described. I  told him that this is not a diagnosis that most doctors would make and put in an E HR without documentation but he is just not familiar with it and neither his his daughter. For now we are using wet- to-dry dressings. I think it is time to try a wound VAC now that we are certain about underlying infection issues Electronic Signature(s) Signed: 09/19/2019 5:01:35 PM By: Linton Ham MD Entered By: Linton Ham on 09/19/2019 12:03:52 Cho, [name] ([id number]) -------------------------------------------------------------------------------- Physical Exam Details Patient Name: [name], [name] Date of Service: 09/19/2019 10:30 AM Medical Record Number: [id number] Patient Account Number: [id number] Date of Birth/Sex: Sep 04, 1965 (54 y.o. M) Treating RN: [name] Primary Care Provider: [name] Other Clinician: Referring Provider: [name] Treating Provider/Extender: Ricard Dillon Weeks in Treatment: 2 Constitutional Sitting or standing Blood Pressure is within target range for patient.. Pulse regular and within target range for patient.Marland Kitchen Respirations regular, non- labored and within target range.. Temperature is normal and within the target range for the patient.Marland Kitchen appears in no distress. Notes Wound exam; the area in question is on the lower sacrum which connects with an area on the proximal right buttock. There is a bridge of skin over this. The orifice of the wounds is smaller than when he first came in however these are deep wounds with considerable undermining superiorly and extending to the right of the actual wound areas. There is no exposed bone. No soft tissue tenderness or crepitus around the wound area Electronic Signature(s) Signed: 09/19/2019 5:01:35 PM By: Linton Ham MD Entered By: Linton Ham on 09/19/2019 12:04:54 Friis, [name] ([id number]) -------------------------------------------------------------------------------- Physician Orders  Details Patient Name: [name], [name] Date of Service: 09/19/2019 10:30 AM Medical Record Number: [id number] Patient Account Number: [id number] Date of Birth/Sex: 1966/04/14 (54 y.o. M) Treating RN: [name] Primary Care Provider: [name] Other Clinician: Referring Provider: [name] Treating Provider/Extender: [name] in Treatment: 2 Verbal / Phone Orders: No Diagnosis Coding Wound Cleansing Wound #1 Midline Sacrum o Clean wound with Normal Saline. Anesthetic (add to Medication List) Wound #1 Midline Sacrum o Topical Lidocaine 4% cream applied to wound bed prior to debridement (In Clinic Only). Skin Barriers/Peri-Wound Care Wound #1 Midline Sacrum o Skin Prep Primary Wound Dressing Wound #1 Midline Sacrum o Dry Gauze - Moistened with SIlversorb Gel (hydrogel in clinic) Secondary Dressing Wound #1 Midline Sacrum o Boardered Foam Dressing Dressing Change Frequency Wound #1 Midline Sacrum o Change dressing every day. - Family may be trained for days home health will not be there. o Change Dressing Monday, Wednesday, Friday - Homehealth Follow-up Appointments Wound #1 Midline Sacrum o Return Appointment in 1 week. Off-Loading Wound #1 Midline Sacrum o Other: - NO Pressure on the wounded areas. Additional Orders / Instructions Wound #1 Midline Sacrum o Increase protein intake. - Powder protein supplement, multivitamin o Activity as tolerated Home Health Wound #1 Midline [place] Visits - [place] Nurse may visit PRN to address patientos wound care needs. o FACE TO FACE ENCOUNTER: MEDICARE and MEDICAID PATIENTS: I certify that this patient is under my care and that I had a face-to- face  encounter that meets the physician face-to-face encounter requirements with this patient on this date. The encounter with the patient was in whole or in part for the following MEDICAL CONDITION: (primary  reason for [place]) MEDICAL NECESSITY: I certify, that based on my findings, NURSING services are a medically necessary home health service. HOME BOUND STATUS: I certify that my clinical findings support that this patient is homebound (i.e., Due to illness or injury, pt requires aid of supportive devices such as crutches, cane, wheelchairs, walkers, the use of special transportation or the assistance of another person to leave their place of residence. There is a normal inability to leave the home and doing so requires considerable and taxing effort. Other absences are for medical reasons / religious services and are infrequent or of short duration when for other reasons). Heidecker, VOLLIE MARTIE ([id number]) o If current dressing causes regression in wound condition, may D/C ordered dressing product/s and apply Normal Saline Moist Dressing daily until next [place] / Other MD appointment. [place] of regression in wound condition at [phone]. o Please direct any NON-WOUND related issues/requests for orders to patient's Primary Care Physician Negative Pressure Wound Therapy Wound #1 Midline Sacrum o Wound VAC settings at 125/130 mmHg continuous pressure. Use BLACK/GREEN foam to wound cavity. Use WHITE foam to fill any tunnel/s and/or undermining. Change VAC dressing 3 X WEEK. Change canister as indicated when full. Nurse may titrate settings and frequency of dressing changes as clinically indicated. - Initiate -Home health to apply once delivered. o Home Health Nurse may d/c VAC for s/s of increased infection, significant wound regression, or uncontrolled drainage. [place] at [phone]. Electronic Signature(s) Signed: 09/19/2019 5:01:35 PM By: Linton Ham MD Signed: 09/28/2019 5:40:44 PM By: Gretta Cool, BSN, RN, CWS, Kim RN, BSN Entered By: Gretta Cool, BSN, RN, CWS, Kim on 09/19/2019 11:22:37 Fenoglio, [name]  ([id number]) -------------------------------------------------------------------------------- Problem List Details Patient Name: Dickman, SUZANNE MARSO. Date of Service: 09/19/2019 10:30 AM Medical Record Number: [id number] Patient Account Number: [id number] Date of Birth/Sex: Feb 08, 1966 (54 y.o. M) Treating RN: [name] Primary Care Provider: [name] Other Clinician: Referring Provider: [name] Treating Provider/Extender: [name] in Treatment: 2 Active Problems ICD-10 Evaluated Encounter Code Description Active Date Today Diagnosis L89.154 Pressure ulcer of sacral region, stage 4 09/05/2019 No Yes T81.31XD Disruption of external operation (surgical) wound, not elsewhere classified, 09/05/2019 No Yes subsequent encounter L02.31 Cutaneous abscess of buttock 09/05/2019 No Yes Inactive Problems Resolved Problems Electronic Signature(s) Signed: 09/19/2019 5:01:35 PM By: Linton Ham MD Entered By: Linton Ham on 09/19/2019 11:55:14 Crull, [name] ([id number]) -------------------------------------------------------------------------------- Progress Note Details Patient Name: Marich, [name] Date of Service: 09/19/2019 10:30 AM Medical Record Number: [id number] Patient Account Number: [id number] Date of Birth/Sex: [birth year] (54 y.o. M) Treating RN: [name] Primary Care Provider: [name] Other Clinician: Referring Provider: [name] Treating Provider/Extender: [name] in Treatment: 2 Subjective History of Present Illness (HPI) ADMISSION 09/05/2019 This is a 54 year old man who has a complicated history which started with acute low back pain sometime in September. Saw his primary doctor had a CT scan of the abdomen and pelvis that was normal. By late December he was discovered to have a lumbar epidural abscess with cauda equina syndrome. He was admitted to hospital and had a decompressive laminectomy at L3/L4/L5 blood cultures at the  time showed MSSA he received IV nafcillin. He went to rehab from 1/5 through 1/15 there he was noted to  have an "pressure injury in the skin" although I did not see much more about this in the discharge instructions. Unfortunately this wound became infected after he was discharged home. He required readmission from 1/25 through 2/2 with sepsis, discovered to have a right psoas abscess as well as an infected decubitus ulcer. He had a bedside debridement by general surgery on 1/25 interventional radiology drain the right psoas abscess. General surgery recommended 3 times daily wet-to-dry dressings and an air mattress. He has since been discharged home. He is mobile with a walker. They are using 3 times daily wet-to-dry dressings. According to his family he is eating well. He has a protein supplement but he he is not taking it. He is currently on ampicillin sulbactam as recommended by [name] this is due to be finished currently on 2/22 although there seem to be some suggestion it might go longer. They say he had blood work 2 days ago which I will review. The last lab work I saw was a sedimentation rate of 81 and a C-reactive protein of 212 on 1/25. I did not see any imaging studies of the underlying bone although I will need to review the CT scans that he has had Past medical history, polymyalgia rheumatica, left total hip replacement in 2018 for avascular necrosis, cellulitis of the scrotum in July 2020, history of alcohol abuse but that is not currently problematic 09/12/2019; patient readmitted to the clinic last week. I put him on a wet-to-dry dressing with silver sorb gel to the larger wound area on his coccyx and proximal right buttocks. These are actually connected. In general the wound surfaces look quite good although they are deep and there is a large amount of undermining. There is no exposed bone. Last lab work on 2/8 that was ordered by [name] showed a CRP of 68.5 and a  sedimentation rate of greater than 130. The CRP was elevated versus 18.3 on 1/10 although it is difficult to interpret these necessarily in somebody with polymyalgia rheumatica. His white count was 15.7 hemoglobin 10.7 The patient is offloading this is much as he can says that he is eating and drinking well which seems verified by his family member. 2/24; patient with a large wound on the lower sacrum and proximal right buttocks. There is a bridge of overlying skin but the wounds are connected and there is wide undermining. We are using wet-to-dry dressings. I have reviewed [name] notes of 09/17/2019 he is remove the PICC line. He had a prolonged course of IV antibiotics for the MSSA bacteremia, lumbar infection, psoas abscess and a necrotic sacral wound. Interestingly I noticed the same history of PMR in [place] link that [name] comments on. I went over the case again with the patient and his daughter he is totally unfamiliar with anything to do with polymyalgia rheumatica or its obvious symptoms that I also described. I told him that this is not a diagnosis that most doctors would make and put in an E HR without documentation but he is just not familiar with it and neither his his daughter. For now we are using wet- to-dry dressings. I think it is time to try a wound VAC now that we are certain about underlying infection issues Objective Constitutional Sitting or standing Blood Pressure is within target range for patient.. Pulse regular and within target range for patient.Marland Kitchen Respirations regular, non- labored and within target range.. Temperature is normal and within the target range for the patient.Marland Kitchen  appears in no distress. Vitals Time Taken: 10:58 AM, Height: 72 in, Weight: 230 lbs, BMI: 31.2, Temperature: 98.9 F, Pulse: 102 bpm, Respiratory Rate: 16 breaths/min, Blood Pressure: 138/81 mmHg. General Notes: Wound exam; the area in question is on the lower sacrum which connects  with an area on the proximal right buttock. There is a bridge of skin over this. The orifice of the wounds is smaller than when he first came in however these are deep wounds with considerable undermining superiorly and extending to the right of the actual wound areas. There is no exposed bone. No soft tissue tenderness or crepitus around the wound area Vidovich, Bucky R. ([id number]) Integumentary (Hair, Skin) Wound #1 status is Open. Original cause of wound was Gradually Appeared. The wound is located on the Midline Sacrum. The wound measures 2.6cm length x 3.5cm width x 4.8cm depth; 7.147cm^2 area and 34.306cm^3 volume. There is muscle and Fat Layer (Subcutaneous Tissue) Exposed exposed. There is no undermining noted, however, there is tunneling at 1:00 with a maximum distance of 4cm. There is a medium amount of serosanguineous drainage noted. The wound margin is thickened. There is large (67-100%) red granulation within the wound bed. There is a small (1-33%) amount of necrotic tissue within the wound bed including Adherent Slough. Assessment Active Problems ICD-10 Pressure ulcer of sacral region, stage 4 Disruption of external operation (surgical) wound, not elsewhere classified, subsequent encounter Cutaneous abscess of buttock Plan Wound Cleansing: Wound #1 Midline Sacrum: Clean wound with Normal Saline. Anesthetic (add to Medication List): Wound #1 Midline Sacrum: Topical Lidocaine 4% cream applied to wound bed prior to debridement (In Clinic Only). Skin Barriers/Peri-Wound Care: Wound #1 Midline Sacrum: Skin Prep Primary Wound Dressing: Wound #1 Midline Sacrum: Dry Gauze - Moistened with SIlversorb Gel (hydrogel in clinic) Secondary Dressing: Wound #1 Midline Sacrum: Boardered Foam Dressing Dressing Change Frequency: Wound #1 Midline Sacrum: Change dressing every day. - Family may be trained for days home health will not be there. Change Dressing Monday, Wednesday, Friday -  Homehealth Follow-up Appointments: Wound #1 Midline Sacrum: Return Appointment in 1 week. Off-Loading: Wound #1 Midline Sacrum: Other: - NO Pressure on the wounded areas. Additional Orders / Instructions: Wound #1 Midline Sacrum: Increase protein intake. - Powder protein supplement, multivitamin Activity as tolerated Home Health: Wound #1 Midline Sacrum: Continue Home Health Visits - [facility] Nurse may visit PRN to address patient s wound care needs. FACE TO FACE ENCOUNTER: MEDICARE and MEDICAID PATIENTS: I certify that this patient is under my care and that I had a face-to-face encounter that meets the physician face-to-face encounter requirements with this patient on this date. The encounter with the patient was in whole or in part for the following MEDICAL CONDITION: (primary reason for [place]) MEDICAL NECESSITY: I certify, that based on my findings, NURSING services are a medically necessary home health service. HOME BOUND STATUS: I certify that my clinical findings support that this patient is homebound (i.e., Due to illness or injury, pt requires aid of supportive devices such as crutches, cane, wheelchairs, walkers, the use of special transportation or the assistance of another person to leave their place of residence. There is a normal inability to leave the home and doing so requires considerable and taxing effort. Other absences are for medical reasons / religious services and are infrequent or of short duration when for other reasons). If current dressing causes regression in wound condition, may D/C ordered dressing product/s and apply Normal Saline Moist Dressing daily until  next [place] / Other MD appointment. [place] of regression in wound condition at [phone]. Churilla, JOVANN LIVERS ([id number]) Please direct any NON-WOUND related issues/requests for orders to patient's Primary Care Physician Negative Pressure Wound  Therapy: Wound #1 Midline Sacrum: Wound VAC settings at 125/130 mmHg continuous pressure. Use BLACK/GREEN foam to wound cavity. Use WHITE foam to fill any tunnel/s and/or undermining. Change VAC dressing 3 X WEEK. Change canister as indicated when full. Nurse may titrate settings and frequency of dressing changes as clinically indicated. - Initiate -Home health to apply once delivered. Home Health Nurse may d/c VAC for s/s of increased infection, significant wound regression, or uncontrolled drainage. [place] at [phone]. 1. I think it is time to initiate wound VAC therapy. 2. If the bridge of skin in this area is a hindrance that can be removed I have not done that as of yet 3. As [name] stated in his notes I am unable to elicit any recognition of polymyalgia rheumatica either from the patient or his daughter. His primary doctor is at [facility] but apparently has only been going there for a year. This means his inflammatory markers are still elevated without clear explanation if it is truly does not have PMR. 4. We will have to see how he does with a wound VAC I do not think there is another good option other than plastic surgery consultation Electronic Signature(s) Signed: 09/19/2019 5:01:35 PM By: Linton Ham MD Entered By: Linton Ham on 09/19/2019 12:06:38 [place]les, [name] ([id number]) -------------------------------------------------------------------------------- [place] Details Patient Name: Leinen, [name] Date of Service: 09/19/2019 Medical Record Number: [id number] Patient Account Number: [id number] Date of Birth/Sex: 09/28/1965 (54 y.o. M) Treating RN: [name] Primary Care Provider: [name] Other Clinician: Referring Provider: [name] Treating Provider/Extender: [name] in Treatment: 2 Diagnosis Coding ICD-10 Codes Code Description L89.154 Pressure ulcer of sacral region, stage 4 T81.31XD  Disruption of external operation (surgical) wound, not elsewhere classified, subsequent encounter L02.31 Cutaneous abscess of buttock Facility Procedures CPT4 Code: [id number] Description: 99213 - WOUND CARE VISIT-LEV 3 EST PT Modifier: Quantity: 1 Physician Procedures CPT4 Code Description: [id number] - WC PHYS LEVEL 4 - EST PT Modifier: Quantity: 1 CPT4 Code Description: ICD-10 Diagnosis Description L89.154 Pressure ulcer of sacral region, stage 4 T81.31XD Disruption of external operation (surgical) wound, not elsewhere classified Modifier: , subsequent encount Quantity: er Engineer, maintenance) Signed: 09/19/2019 5:01:35 PM By: Linton Ham MD Entered By: Linton Ham on 09/19/2019 12:07:07

## 2019-09-29 DIAGNOSIS — L89154 Pressure ulcer of sacral region, stage 4: Secondary | ICD-10-CM | POA: Diagnosis not present

## 2019-09-30 DIAGNOSIS — L89154 Pressure ulcer of sacral region, stage 4: Secondary | ICD-10-CM | POA: Diagnosis not present

## 2019-10-01 DIAGNOSIS — L89312 Pressure ulcer of right buttock, stage 2: Secondary | ICD-10-CM | POA: Diagnosis not present

## 2019-10-01 DIAGNOSIS — I081 Rheumatic disorders of both mitral and tricuspid valves: Secondary | ICD-10-CM | POA: Diagnosis not present

## 2019-10-01 DIAGNOSIS — M47816 Spondylosis without myelopathy or radiculopathy, lumbar region: Secondary | ICD-10-CM | POA: Diagnosis not present

## 2019-10-01 DIAGNOSIS — G061 Intraspinal abscess and granuloma: Secondary | ICD-10-CM | POA: Diagnosis not present

## 2019-10-01 DIAGNOSIS — M4804 Spinal stenosis, thoracic region: Secondary | ICD-10-CM | POA: Diagnosis not present

## 2019-10-01 DIAGNOSIS — T8142XA Infection following a procedure, deep incisional surgical site, initial encounter: Secondary | ICD-10-CM | POA: Diagnosis not present

## 2019-10-01 DIAGNOSIS — G834 Cauda equina syndrome: Secondary | ICD-10-CM | POA: Diagnosis not present

## 2019-10-01 DIAGNOSIS — L89894 Pressure ulcer of other site, stage 4: Secondary | ICD-10-CM | POA: Diagnosis not present

## 2019-10-01 DIAGNOSIS — M4646 Discitis, unspecified, lumbar region: Secondary | ICD-10-CM | POA: Diagnosis not present

## 2019-10-01 DIAGNOSIS — M48061 Spinal stenosis, lumbar region without neurogenic claudication: Secondary | ICD-10-CM | POA: Diagnosis not present

## 2019-10-01 DIAGNOSIS — M4604 Spinal enthesopathy, thoracic region: Secondary | ICD-10-CM | POA: Diagnosis not present

## 2019-10-01 DIAGNOSIS — L89154 Pressure ulcer of sacral region, stage 4: Secondary | ICD-10-CM | POA: Diagnosis not present

## 2019-10-01 DIAGNOSIS — M4316 Spondylolisthesis, lumbar region: Secondary | ICD-10-CM | POA: Diagnosis not present

## 2019-10-01 DIAGNOSIS — B9561 Methicillin susceptible Staphylococcus aureus infection as the cause of diseases classified elsewhere: Secondary | ICD-10-CM | POA: Diagnosis not present

## 2019-10-01 DIAGNOSIS — K6812 Psoas muscle abscess: Secondary | ICD-10-CM | POA: Diagnosis not present

## 2019-10-01 DIAGNOSIS — M5144 Schmorl's nodes, thoracic region: Secondary | ICD-10-CM | POA: Diagnosis not present

## 2019-10-01 DIAGNOSIS — R7881 Bacteremia: Secondary | ICD-10-CM | POA: Diagnosis not present

## 2019-10-02 ENCOUNTER — Ambulatory Visit: Payer: BC Managed Care – PPO | Admitting: Family Medicine

## 2019-10-02 ENCOUNTER — Encounter: Payer: Self-pay | Admitting: Family Medicine

## 2019-10-02 ENCOUNTER — Other Ambulatory Visit: Payer: Self-pay

## 2019-10-02 VITALS — BP 118/76 | HR 89 | Temp 97.7°F | Ht 72.0 in | Wt 227.8 lb

## 2019-10-02 DIAGNOSIS — L8990 Pressure ulcer of unspecified site, unspecified stage: Secondary | ICD-10-CM

## 2019-10-02 DIAGNOSIS — G834 Cauda equina syndrome: Secondary | ICD-10-CM

## 2019-10-02 DIAGNOSIS — Z9889 Other specified postprocedural states: Secondary | ICD-10-CM

## 2019-10-02 DIAGNOSIS — L89154 Pressure ulcer of sacral region, stage 4: Secondary | ICD-10-CM | POA: Diagnosis not present

## 2019-10-02 MED ORDER — OXYCODONE HCL 10 MG PO TABS: 10.0000 mg | ORAL_TABLET | Freq: Three times a day (TID) | ORAL | 0 refills | Status: DC | PRN

## 2019-10-02 MED ORDER — OXYCODONE HCL 10 MG PO TABS: 10.0000 mg | ORAL_TABLET | ORAL | 0 refills | Status: DC | PRN

## 2019-10-02 NOTE — Progress Notes (Signed)
Subjective:     [name] is a 54 y.o. male presenting for Follow-up     HPI   # wound care - going 3 times a week - pain is improving - still bad at night - left leg pain is severe, weaker than the right leg - weakness: L>R, working with home health physical therapy - not coordinated and hurts - Pain management: oxycodone - mainly at night and when he goes to wound care - taking 1-2 tablets per day - has wound vac - got it 1 week ago, changed on Monday  - has had significant healing in just 1 week - works as a Dealer  #leg [place] - daughter has noticed that this has gotten worse - was showering and he raised his leg on the bench - pain is in the hip and the anterior thigh - was resting leg    Review of Systems  09/04/2019: Clinic - hospital follow-up, seeing ID/wound. Still on abx.   Social History   Tobacco Use  Smoking Status Former Smoker  . Packs/day: 1.00  . Years: 14.00  . Pack years: 14.00  . Types: Cigarettes  . Quit date: 07/27/1999  . Years since quitting: 20.1  Smokeless Tobacco Never Used        Objective:    BP Readings from Last 3 Encounters:  10/02/19 118/76  09/04/19 104/72  09/03/19 106/71   Wt Readings from Last 3 Encounters:  10/02/19 227 lb 12 oz (103.3 kg)  09/17/19 230 lb (104.3 kg)  09/04/19 231 lb 12 oz (105.1 kg)    BP 118/76   Pulse 89   Temp 97.7 F (36.5 C)   Ht 6' (1.829 m)   Wt 227 lb 12 oz (103.3 kg)   SpO2 100%   BMI 30.89 kg/m    Physical Exam Constitutional:      Appearance: Normal appearance. He is not ill-appearing or diaphoretic.     Comments: Ambulating with walker. Moves with some discomfort from one seated position to the another. Able to get on exam table without assistance.   HENT:     Right Ear: External ear normal.     Left Ear: External ear normal.     Nose: Nose normal.  Eyes:     General: No scleral icterus.    Extraocular Movements: Extraocular movements intact.   Conjunctiva/sclera: Conjunctivae normal.  Cardiovascular:     Rate and Rhythm: Normal rate.  Pulmonary:     Effort: Pulmonary effort is normal.  Musculoskeletal:     Cervical back: Neck supple.     Comments: Back:  Inspection: well healing midline lumbar scar. Wound not examined Palpation: mild TTP of the back Hip flexors: Right 4+/5, Left 4-/5 Knee flexion/extension: Right 5/5, Left 4/5 Foot dorsiflexion/plantar flexion: Right 5/5, Left 4/5   Skin:    General: Skin is warm and dry.  Neurological:     Mental Status: He is alert. Mental status is at baseline.  Psychiatric:        Mood and Affect: Mood normal.           Assessment & Plan:   Problem List Items Addressed This Visit      Nervous and Auditory   Cauda equina syndrome ([place]) - Primary    Pt somewhat frustrated with persistent weakness. Discussed that this unfortunately takes a long time. Anticipate 3 months. Encouraged that exam today seems improved from Rehab discharge 1 month ago and per patient report [facility] PT  thinks he is improving. He will take it easy with transitions especially on the left side as it is weaker. He will also continue to do PT. Pain improved overall - still using oxycodone prn at night. Will refill with goal to continue to decrease.       Relevant Medications   Oxycodone HCl 10 MG TABS   Oxycodone HCl 10 MG TABS (Start on 11/02/2019)     Musculoskeletal and Integument   Pressure injury of skin    Reviewed wound care note. Has wound vac and symptoms improved significantly. Using oxycodone with his wound care visits.       Relevant Medications   Oxycodone HCl 10 MG TABS   Oxycodone HCl 10 MG TABS (Start on 11/02/2019)     Other   S/P lumbar laminectomy   Relevant Medications   Oxycodone HCl 10 MG TABS   Oxycodone HCl 10 MG TABS (Start on 11/02/2019)       Return in about 2 months (around 12/02/2019).  Lesleigh Noe, MD

## 2019-10-02 NOTE — Patient Instructions (Addendum)
Continue working with physical therapy.   Call the office if you find out FMLA needs to be extended. I can plan to extend for 4 months from today (hopefully you won't need it).   Continue to try and decrease your oxycodone use  Be careful when you are moving -- your hip flexors are still weak this means that when you try to move your leg when it is propped up you may need to use your arms to help lift it.   Make in-person visits if you are concerned about weakness or pain that is new. If you are just checking in for medication it can be virtual.

## 2019-10-02 NOTE — Assessment & Plan Note (Signed)
Reviewed wound care note. Has wound vac and symptoms improved significantly. Using oxycodone with his wound care visits.

## 2019-10-02 NOTE — Assessment & Plan Note (Signed)
Pt somewhat frustrated with persistent weakness. Discussed that this unfortunately takes a long time. Anticipate 3 months. Encouraged that exam today seems improved from Rehab discharge 1 month ago and per patient report [facility] PT thinks he is improving. He will take it easy with transitions especially on the left side as it is weaker. He will also continue to do PT. Pain improved overall - still using oxycodone prn at night. Will refill with goal to continue to decrease.

## 2019-10-03 ENCOUNTER — Encounter: Payer: BC Managed Care – PPO | Admitting: Internal Medicine

## 2019-10-03 DIAGNOSIS — M47816 Spondylosis without myelopathy or radiculopathy, lumbar region: Secondary | ICD-10-CM | POA: Diagnosis not present

## 2019-10-03 DIAGNOSIS — M4804 Spinal stenosis, thoracic region: Secondary | ICD-10-CM | POA: Diagnosis not present

## 2019-10-03 DIAGNOSIS — L89159 Pressure ulcer of sacral region, unspecified stage: Secondary | ICD-10-CM | POA: Diagnosis not present

## 2019-10-03 DIAGNOSIS — X58XXXD Exposure to other specified factors, subsequent encounter: Secondary | ICD-10-CM | POA: Diagnosis not present

## 2019-10-03 DIAGNOSIS — B9561 Methicillin susceptible Staphylococcus aureus infection as the cause of diseases classified elsewhere: Secondary | ICD-10-CM | POA: Diagnosis not present

## 2019-10-03 DIAGNOSIS — M5144 Schmorl's nodes, thoracic region: Secondary | ICD-10-CM | POA: Diagnosis not present

## 2019-10-03 DIAGNOSIS — L89154 Pressure ulcer of sacral region, stage 4: Secondary | ICD-10-CM | POA: Diagnosis not present

## 2019-10-03 DIAGNOSIS — Z96642 Presence of left artificial hip joint: Secondary | ICD-10-CM | POA: Diagnosis not present

## 2019-10-03 DIAGNOSIS — M4646 Discitis, unspecified, lumbar region: Secondary | ICD-10-CM | POA: Diagnosis not present

## 2019-10-03 DIAGNOSIS — R7881 Bacteremia: Secondary | ICD-10-CM | POA: Diagnosis not present

## 2019-10-03 DIAGNOSIS — M4604 Spinal enthesopathy, thoracic region: Secondary | ICD-10-CM | POA: Diagnosis not present

## 2019-10-03 DIAGNOSIS — Z8619 Personal history of other infectious and parasitic diseases: Secondary | ICD-10-CM | POA: Diagnosis not present

## 2019-10-03 DIAGNOSIS — T8142XA Infection following a procedure, deep incisional surgical site, initial encounter: Secondary | ICD-10-CM | POA: Diagnosis not present

## 2019-10-03 DIAGNOSIS — L89894 Pressure ulcer of other site, stage 4: Secondary | ICD-10-CM | POA: Diagnosis not present

## 2019-10-03 DIAGNOSIS — I081 Rheumatic disorders of both mitral and tricuspid valves: Secondary | ICD-10-CM | POA: Diagnosis not present

## 2019-10-03 DIAGNOSIS — M4316 Spondylolisthesis, lumbar region: Secondary | ICD-10-CM | POA: Diagnosis not present

## 2019-10-03 DIAGNOSIS — K6812 Psoas muscle abscess: Secondary | ICD-10-CM | POA: Diagnosis not present

## 2019-10-03 DIAGNOSIS — T8131XD Disruption of external operation (surgical) wound, not elsewhere classified, subsequent encounter: Secondary | ICD-10-CM | POA: Diagnosis not present

## 2019-10-03 DIAGNOSIS — L0231 Cutaneous abscess of buttock: Secondary | ICD-10-CM | POA: Diagnosis not present

## 2019-10-03 DIAGNOSIS — G834 Cauda equina syndrome: Secondary | ICD-10-CM | POA: Diagnosis not present

## 2019-10-03 DIAGNOSIS — M353 Polymyalgia rheumatica: Secondary | ICD-10-CM | POA: Diagnosis not present

## 2019-10-03 DIAGNOSIS — M48061 Spinal stenosis, lumbar region without neurogenic claudication: Secondary | ICD-10-CM | POA: Diagnosis not present

## 2019-10-03 DIAGNOSIS — L89312 Pressure ulcer of right buttock, stage 2: Secondary | ICD-10-CM | POA: Diagnosis not present

## 2019-10-03 DIAGNOSIS — Z881 Allergy status to other antibiotic agents status: Secondary | ICD-10-CM | POA: Diagnosis not present

## 2019-10-03 DIAGNOSIS — G061 Intraspinal abscess and granuloma: Secondary | ICD-10-CM | POA: Diagnosis not present

## 2019-10-03 NOTE — Progress Notes (Signed)
Saindon, ARNE MASSENGILL ([id number]) Visit Report for 10/03/2019 HPI Details Patient Name: [name], [name] Date of Service: 10/03/2019 2:00 PM Medical Record Number: [id number] Patient Account Number: [id number] Date of Birth/Sex: 04/23/1966 (54 y.o. M) Treating RN: [name] Primary Care Provider: [name] Other Clinician: Referring Provider: [name] Treating Provider/Extender: [name] in Treatment: 4 History of Present Illness HPI Description: ADMISSION 09/05/2019 This is a 54 year old man who has a complicated history which started with acute low back pain sometime in September. Saw his primary doctor had a CT scan of the abdomen and pelvis that was normal. By late December he was discovered to have a lumbar epidural abscess with cauda equina syndrome. He was admitted to hospital and had a decompressive laminectomy at L3/L4/L5 blood cultures at the time showed MSSA he received IV nafcillin. He went to rehab from 1/5 through 1/15 there he was noted to have an "pressure injury in the skin" although I did not see much more about this in the discharge instructions. Unfortunately this wound became infected after he was discharged home. He required readmission from 1/25 through 2/2 with sepsis, discovered to have a right psoas abscess as well as an infected decubitus ulcer. He had a bedside debridement by general surgery on 1/25 interventional radiology drain the right psoas abscess. General surgery recommended 3 times daily wet-to-dry dressings and an air mattress. He has since been discharged home. He is mobile with a walker. They are using 3 times daily wet-to-dry dressings. According to his family he is eating well. He has a protein supplement but he he is not taking it. He is currently on ampicillin sulbactam as recommended by [name] this is due to be finished currently on 2/22 although there seem to be some suggestion it might go longer. They say he had blood work 2  days ago which I will review. The last lab work I saw was a sedimentation rate of 81 and a C-reactive protein of 212 on 1/25. I did not see any imaging studies of the underlying bone although I will need to review the CT scans that he has had Past medical history, polymyalgia rheumatica, left total hip replacement in 2018 for avascular necrosis, cellulitis of the scrotum in July 2020, history of alcohol abuse but that is not currently problematic 09/12/2019; patient readmitted to the clinic last week. I put him on a wet-to-dry dressing with silver sorb gel to the larger wound area on his coccyx and proximal right buttocks. These are actually connected. In general the wound surfaces look quite good although they are deep and there is a large amount of undermining. There is no exposed bone. Last lab work on 2/8 that was ordered by [name] showed a CRP of 68.5 and a sedimentation rate of greater than 130. The CRP was elevated versus 18.3 on 1/10 although it is difficult to interpret these necessarily in somebody with polymyalgia rheumatica. His white count was 15.7 hemoglobin 10.7 The patient is offloading this is much as he can says that he is eating and drinking well which seems verified by his family member. 2/24; patient with a large wound on the lower sacrum and proximal right buttocks. There is a bridge of overlying skin but the wounds are connected and there is wide undermining. We are using wet-to-dry dressings. I have reviewed [name] notes of 09/17/2019 he is remove the PICC line. He had a prolonged course of IV antibiotics for the MSSA bacteremia, lumbar infection, psoas  abscess and a necrotic sacral wound. Interestingly I noticed the same history of PMR in [place] link that [name] comments on. I went over the case again with the patient and his daughter he is totally unfamiliar with anything to do with polymyalgia rheumatica or its obvious symptoms that I also described. I  told him that this is not a diagnosis that most doctors would make and put in an E HR without documentation but he is just not familiar with it and neither his his daughter. For now we are using wet- to-dry dressings. I think it is time to try a wound VAC now that we are certain about underlying infection issues 3/3; he arrives today with his wound actually looks somewhat better. Two small areas with a bridge of skin actually have contracted I believe there is less depth. There is still extensive undermining. He got his VAC machine yesterday but they have still not put it on. 3/10; the orifice of the actual wound is contracting faster than the undermining dimensions of the wound. This is probably soon going to make this difficult to continue with the [place] Signature(s) Signed: 10/03/2019 5:25:14 PM By: Linton Ham MD Entered By: Linton Ham on 10/03/2019 14:44:40 Reitman, [name] ([id number]) -------------------------------------------------------------------------------- Physical Exam Details Patient Name: [name], [name] Date of Service: 10/03/2019 2:00 PM Medical Record Number: [id number] Patient Account Number: [id number] Date of Birth/Sex: 07/24/1966 (54 y.o. M) Treating RN: [name] Primary Care Provider: [name] Other Clinician: Referring Provider: [name] Treating Provider/Extender: [name] in Treatment: 4 Constitutional Sitting or standing Blood Pressure is within target range for patient.. Pulse regular and within target range for patient.Marland Kitchen Respirations regular, non- labored and within target range.. Temperature is normal and within the target range for the patient.Marland Kitchen appears in no distress. Notes Wound exam; the area in question is on the lower sacrum on the right posterior buttock. The orifices of these wounds are small but I am really uncertain how much the undermining tissue is close then. There is no surrounding erythema no purulence  no tenderness. Electronic Signature(s) Signed: 10/03/2019 5:25:14 PM By: Linton Ham MD Entered By: Linton Ham on 10/03/2019 14:45:35 Summey, [name] ([id number]) -------------------------------------------------------------------------------- Physician Orders Details Patient Name: Ducksworth, [name] Date of Service: 10/03/2019 2:00 PM Medical Record Number: [id number] Patient Account Number: [id number] Date of Birth/Sex: 09/24/1965 (54 y.o. M) Treating RN: [name] Primary Care Provider: [name] Other Clinician: Referring Provider: [name] Treating Provider/Extender: [name] in Treatment: 4 Verbal / Phone Orders: No Diagnosis Coding Wound Cleansing Wound #1 Midline Sacrum o Clean wound with Normal Saline. Anesthetic (add to Medication List) Wound #1 Midline Sacrum o Topical Lidocaine 4% cream applied to wound bed prior to debridement (In Clinic Only). Skin Barriers/Peri-Wound Care Wound #1 Midline Sacrum o Skin Prep Primary Wound Dressing Wound #1 Midline Sacrum o Dry Gauze - Moistened with SIlversorb Gel (hydrogel in clinic) Secondary Dressing Wound #1 Midline Sacrum o Boardered Foam Dressing Dressing Change Frequency Wound #1 Midline Sacrum o Change Dressing Monday, Wednesday, Friday - Homehealth Follow-up Appointments Wound #1 Midline Sacrum o Return Appointment in 1 week. Off-Loading Wound #1 Midline Sacrum o Other: - NO Pressure on the wounded areas. Additional Orders / Instructions Wound #1 Midline Sacrum o Increase protein intake. - Powder protein supplement, multivitamin o Activity as tolerated Home Health Wound #1 Midline [place] Visits - [place] Nurse may visit PRN to address  patientos wound care needs. o FACE TO FACE ENCOUNTER: MEDICARE and MEDICAID PATIENTS: I certify that this patient is under my care and that I had a face-to- face encounter that meets the  physician face-to-face encounter requirements with this patient on this date. The encounter with the patient was in whole or in part for the following MEDICAL CONDITION: (primary reason for [place]) MEDICAL NECESSITY: I certify, that based on my findings, NURSING services are a medically necessary home health service. HOME BOUND STATUS: I certify that my clinical findings support that this patient is homebound (i.e., Due to illness or injury, pt requires aid of supportive devices such as crutches, cane, wheelchairs, walkers, the use of special transportation or the assistance of another person to leave their place of residence. There is a normal inability to leave the home and doing so requires considerable and taxing effort. Other absences are for medical reasons / religious services and are infrequent or of short duration when for other reasons). Bach, KRISHAWN BUONOMO ([id number]) o If current dressing causes regression in wound condition, may D/C ordered dressing product/s and apply Normal Saline Moist Dressing daily until next [place] / Other MD appointment. [place] of regression in wound condition at [phone]. o Please direct any NON-WOUND related issues/requests for orders to patient's Primary Care Physician Negative Pressure Wound Therapy Wound #1 Midline Sacrum o Wound VAC settings at 125/130 mmHg continuous pressure. Use BLACK/GREEN foam to wound cavity. Use WHITE foam to fill any tunnel/s and/or undermining. Change VAC dressing 3 X WEEK. Change canister as indicated when full. Nurse may titrate settings and frequency of dressing changes as clinically indicated. - Home health to apply. Bridge to alternate hips at changes. Do not place where patient is sitting on suction. o Home Health Nurse may d/c VAC for s/s of increased infection, significant wound regression, or uncontrolled drainage. [place] at  [phone]. Electronic Signature(s) Signed: 10/03/2019 5:22:30 PM By: Gretta Cool, BSN, RN, CWS, Kim RN, BSN Signed: 10/03/2019 5:25:14 PM By: Linton Ham MD Entered By: Gretta Cool, BSN, RN, CWS, Kim on 10/03/2019 14:28:46 Tapia, [name] ([id number]) -------------------------------------------------------------------------------- Problem List Details Patient Name: [name], [name] Date of Service: 10/03/2019 2:00 PM Medical Record Number: [id number] Patient Account Number: [id number] Date of Birth/Sex: 05/22/1966 (54 y.o. M) Treating RN: [name] Primary Care Provider: [name] Other Clinician: Referring Provider: [name] Treating Provider/Extender: [name] in Treatment: 4 Active Problems ICD-10 Evaluated Encounter Code Description Active Date Today Diagnosis L89.154 Pressure ulcer of sacral region, stage 4 09/05/2019 No Yes T81.31XD Disruption of external operation (surgical) wound, not elsewhere classified, 09/05/2019 No Yes subsequent encounter L02.31 Cutaneous abscess of buttock 09/05/2019 No Yes Inactive Problems Resolved Problems Electronic Signature(s) Signed: 10/03/2019 5:25:14 PM By: Linton Ham MD Entered By: Linton Ham on 10/03/2019 14:43:02 Ehresman, [name] ([id number]) -------------------------------------------------------------------------------- Progress Note Details Patient Name: [name], [name] Date of Service: 10/03/2019 2:00 PM Medical Record Number: [id number] Patient Account Number: [id number] Date of Birth/Sex: 03/27/1966 (54 y.o. M) Treating RN: [name] Primary Care Provider: [name] Other Clinician: Referring Provider: [name] Treating Provider/Extender: [name] in Treatment: 4 Subjective History of Present Illness (HPI) ADMISSION 09/05/2019 This is a 54 year old man who has a complicated history which started with acute low back pain sometime in September. Saw his primary doctor had a CT scan of  the abdomen and pelvis that was normal. By late December he was discovered to have a lumbar epidural  abscess with cauda equina syndrome. He was admitted to hospital and had a decompressive laminectomy at L3/L4/L5 blood cultures at the time showed MSSA he received IV nafcillin. He went to rehab from 1/5 through 1/15 there he was noted to have an "pressure injury in the skin" although I did not see much more about this in the discharge instructions. Unfortunately this wound became infected after he was discharged home. He required readmission from 1/25 through 2/2 with sepsis, discovered to have a right psoas abscess as well as an infected decubitus ulcer. He had a bedside debridement by general surgery on 1/25 interventional radiology drain the right psoas abscess. General surgery recommended 3 times daily wet-to-dry dressings and an air mattress. He has since been discharged home. He is mobile with a walker. They are using 3 times daily wet-to-dry dressings. According to his family he is eating well. He has a protein supplement but he he is not taking it. He is currently on ampicillin sulbactam as recommended by [name] this is due to be finished currently on 2/22 although there seem to be some suggestion it might go longer. They say he had blood work 2 days ago which I will review. The last lab work I saw was a sedimentation rate of 81 and a C-reactive protein of 212 on 1/25. I did not see any imaging studies of the underlying bone although I will need to review the CT scans that he has had Past medical history, polymyalgia rheumatica, left total hip replacement in 2018 for avascular necrosis, cellulitis of the scrotum in July 2020, history of alcohol abuse but that is not currently problematic 09/12/2019; patient readmitted to the clinic last week. I put him on a wet-to-dry dressing with silver sorb gel to the larger wound area on his coccyx and proximal right buttocks. These are actually  connected. In general the wound surfaces look quite good although they are deep and there is a large amount of undermining. There is no exposed bone. Last lab work on 2/8 that was ordered by [name] showed a CRP of 68.5 and a sedimentation rate of greater than 130. The CRP was elevated versus 18.3 on 1/10 although it is difficult to interpret these necessarily in somebody with polymyalgia rheumatica. His white count was 15.7 hemoglobin 10.7 The patient is offloading this is much as he can says that he is eating and drinking well which seems verified by his family member. 2/24; patient with a large wound on the lower sacrum and proximal right buttocks. There is a bridge of overlying skin but the wounds are connected and there is wide undermining. We are using wet-to-dry dressings. I have reviewed [name] notes of 09/17/2019 he is remove the PICC line. He had a prolonged course of IV antibiotics for the MSSA bacteremia, lumbar infection, psoas abscess and a necrotic sacral wound. Interestingly I noticed the same history of PMR in [place] link that [name] comments on. I went over the case again with the patient and his daughter he is totally unfamiliar with anything to do with polymyalgia rheumatica or its obvious symptoms that I also described. I told him that this is not a diagnosis that most doctors would make and put in an E HR without documentation but he is just not familiar with it and neither his his daughter. For now we are using wet- to-dry dressings. I think it is time to try a wound VAC now that we are certain about underlying infection  issues 3/3; he arrives today with his wound actually looks somewhat better. Two small areas with a bridge of skin actually have contracted I believe there is less depth. There is still extensive undermining. He got his VAC machine yesterday but they have still not put it on. 3/10; the orifice of the actual wound is contracting faster than  the undermining dimensions of the wound. This is probably soon going to make this difficult to continue with the VAC Objective Constitutional Sitting or standing Blood Pressure is within target range for patient.. Pulse regular and within target range for patient.Marland Kitchen Respirations regular, non- labored and within target range.. Temperature is normal and within the target range for the patient.Marland Kitchen appears in no distress. Vitals Time Taken: 2:08 PM, Height: 72 in, Weight: 230 lbs, BMI: 31.2, Temperature: 98.6 F, Pulse: 80 bpm, Respiratory Rate: 16 breaths/min, Blood Pressure: 127/70 mmHg. Devan, VINTON DICUS ([id number]) General Notes: Wound exam; the area in question is on the lower sacrum on the right posterior buttock. The orifices of these wounds are small but I am really uncertain how much the undermining tissue is close then. There is no surrounding erythema no purulence no tenderness. Integumentary (Hair, Skin) Wound #1 status is Open. Original cause of wound was Gradually Appeared. The wound is located on the Midline Sacrum. The wound measures 1.8cm length x 3cm width x 3.3cm depth; 4.241cm^2 area and 13.996cm^3 volume. There is muscle and Fat Layer (Subcutaneous Tissue) Exposed exposed. There is tunneling at 2:00 with a maximum distance of 5cm. There is a medium amount of serosanguineous drainage noted. The wound margin is thickened. There is large (67-100%) red granulation within the wound bed. There is a small (1-33%) amount of necrotic tissue within the wound bed including Adherent Slough. Assessment Active Problems ICD-10 Pressure ulcer of sacral region, stage 4 Disruption of external operation (surgical) wound, not elsewhere classified, subsequent encounter Cutaneous abscess of buttock Plan Wound Cleansing: Wound #1 Midline Sacrum: Clean wound with Normal Saline. Anesthetic (add to Medication List): Wound #1 Midline Sacrum: Topical Lidocaine 4% cream applied to wound bed prior to  debridement (In Clinic Only). Skin Barriers/Peri-Wound Care: Wound #1 Midline Sacrum: Skin Prep Primary Wound Dressing: Wound #1 Midline Sacrum: Dry Gauze - Moistened with SIlversorb Gel (hydrogel in clinic) Secondary Dressing: Wound #1 Midline Sacrum: Boardered Foam Dressing Dressing Change Frequency: Wound #1 Midline Sacrum: Change Dressing Monday, Wednesday, Friday - Homehealth Follow-up Appointments: Wound #1 Midline Sacrum: Return Appointment in 1 week. Off-Loading: Wound #1 Midline Sacrum: Other: - NO Pressure on the wounded areas. Additional Orders / Instructions: Wound #1 Midline Sacrum: Increase protein intake. - Powder protein supplement, multivitamin Activity as tolerated Home Health: Wound #1 Midline Sacrum: Continue Home Health Visits - [facility] Nurse may visit PRN to address patient s wound care needs. FACE TO FACE ENCOUNTER: MEDICARE and MEDICAID PATIENTS: I certify that this patient is under my care and that I had a face-to-face encounter that meets the physician face-to-face encounter requirements with this patient on this date. The encounter with the patient was in whole or in part for the following MEDICAL CONDITION: (primary reason for [place]) MEDICAL NECESSITY: I certify, that based on my findings, NURSING services are a medically necessary home health service. HOME BOUND STATUS: I certify that my clinical findings support that this patient is homebound (i.e., Due to illness or injury, pt requires aid of supportive devices such as crutches, cane, wheelchairs, walkers, the use of special transportation or the assistance of another  person to leave their place of residence. There is a normal inability to leave the home and doing so requires considerable and taxing effort. Other absences are for medical reasons / religious services and are infrequent or of short duration when for other reasons). If current dressing causes regression in wound  condition, may D/C ordered dressing product/s and apply Normal Saline Moist Dressing daily until next SCHAWN, TANKSLEY ([id number]) [place] / Other MD appointment. [place] of regression in wound condition at [phone]. Please direct any NON-WOUND related issues/requests for orders to patient's Primary Care Physician Negative Pressure Wound Therapy: Wound #1 Midline Sacrum: Wound VAC settings at 125/130 mmHg continuous pressure. Use BLACK/GREEN foam to wound cavity. Use WHITE foam to fill any tunnel/s and/or undermining. Change VAC dressing 3 X WEEK. Change canister as indicated when full. Nurse may titrate settings and frequency of dressing changes as clinically indicated. - Home health to apply. Bridge to alternate hips at changes. Do not place where patient is sitting on suction. Home Health Nurse may d/c VAC for s/s of increased infection, significant wound regression, or uncontrolled drainage. [place] at [phone]. 1. I am continuing to wound VAC this area 2. We will follow up with him in 2 weeks. I do not think it is necessary to bring him in next week. 3. There is no evidence of intercurrent infection here Electronic Signature(s) Signed: 10/03/2019 5:25:14 PM By: Linton Ham MD Entered By: Linton Ham on 10/03/2019 14:47:58 Takaki, [name] ([id number]) -------------------------------------------------------------------------------- [place] Details Patient Name: [name], [name] Date of Service: 10/03/2019 Medical Record Number: [id number] Patient Account Number: [id number] Date of Birth/Sex: 04-22-1966 (54 y.o. M) Treating RN: [name] Primary Care Provider: [name] Other Clinician: Referring Provider: [name] Treating Provider/Extender: [name] in Treatment: 4 Diagnosis Coding ICD-10 Codes Code Description L89.154 Pressure ulcer of sacral region, stage 4 T81.31XD Disruption of external  operation (surgical) wound, not elsewhere classified, subsequent encounter L02.31 Cutaneous abscess of buttock Facility Procedures CPT4 Code: [id number] Description: 99213 - WOUND CARE VISIT-LEV 3 EST PT Modifier: Quantity: 1 Physician Procedures CPT4 Code Description: [id number] - WC PHYS LEVEL 3 - EST PT Modifier: Quantity: 1 CPT4 Code Description: ICD-10 Diagnosis Description L89.154 Pressure ulcer of sacral region, stage 4 T81.31XD Disruption of external operation (surgical) wound, not elsewhere classified Modifier: , subsequent encount Quantity: er Engineer, maintenance) Signed: 10/03/2019 5:25:14 PM By: Linton Ham MD Entered By: Linton Ham on 10/03/2019 14:48:18

## 2019-10-04 DIAGNOSIS — L89312 Pressure ulcer of right buttock, stage 2: Secondary | ICD-10-CM | POA: Diagnosis not present

## 2019-10-04 DIAGNOSIS — M4604 Spinal enthesopathy, thoracic region: Secondary | ICD-10-CM | POA: Diagnosis not present

## 2019-10-04 DIAGNOSIS — I081 Rheumatic disorders of both mitral and tricuspid valves: Secondary | ICD-10-CM | POA: Diagnosis not present

## 2019-10-04 DIAGNOSIS — M48061 Spinal stenosis, lumbar region without neurogenic claudication: Secondary | ICD-10-CM | POA: Diagnosis not present

## 2019-10-04 DIAGNOSIS — M4316 Spondylolisthesis, lumbar region: Secondary | ICD-10-CM | POA: Diagnosis not present

## 2019-10-04 DIAGNOSIS — K6812 Psoas muscle abscess: Secondary | ICD-10-CM | POA: Diagnosis not present

## 2019-10-04 DIAGNOSIS — M4646 Discitis, unspecified, lumbar region: Secondary | ICD-10-CM | POA: Diagnosis not present

## 2019-10-04 DIAGNOSIS — G061 Intraspinal abscess and granuloma: Secondary | ICD-10-CM | POA: Diagnosis not present

## 2019-10-04 DIAGNOSIS — M47816 Spondylosis without myelopathy or radiculopathy, lumbar region: Secondary | ICD-10-CM | POA: Diagnosis not present

## 2019-10-04 DIAGNOSIS — G834 Cauda equina syndrome: Secondary | ICD-10-CM | POA: Diagnosis not present

## 2019-10-04 DIAGNOSIS — R7881 Bacteremia: Secondary | ICD-10-CM | POA: Diagnosis not present

## 2019-10-04 DIAGNOSIS — M5144 Schmorl's nodes, thoracic region: Secondary | ICD-10-CM | POA: Diagnosis not present

## 2019-10-04 DIAGNOSIS — L89154 Pressure ulcer of sacral region, stage 4: Secondary | ICD-10-CM | POA: Diagnosis not present

## 2019-10-04 DIAGNOSIS — B9561 Methicillin susceptible Staphylococcus aureus infection as the cause of diseases classified elsewhere: Secondary | ICD-10-CM | POA: Diagnosis not present

## 2019-10-04 DIAGNOSIS — T8142XA Infection following a procedure, deep incisional surgical site, initial encounter: Secondary | ICD-10-CM | POA: Diagnosis not present

## 2019-10-04 DIAGNOSIS — M4804 Spinal stenosis, thoracic region: Secondary | ICD-10-CM | POA: Diagnosis not present

## 2019-10-04 DIAGNOSIS — L89894 Pressure ulcer of other site, stage 4: Secondary | ICD-10-CM | POA: Diagnosis not present

## 2019-10-04 NOTE — Progress Notes (Signed)
Schembri, [name] ([id number]) Visit Report for 10/03/2019 Arrival Information Details Patient Name: [name], [name] Date of Service: 10/03/2019 2:00 PM Medical Record Number: [id number] Patient Account Number: [id number] Date of Birth/Sex: 27-Feb-1966 (54 y.o. M) Treating RN: Army Melia Primary Care Braylei Totino: [name] Other Clinician: Referring Alysiana Ethridge: [name] Treating Rusty Glodowski/Extender: [name] in Treatment: 4 Visit Information History Since Last Visit Added or deleted any medications: No Patient Arrived: Walker Any new allergies or adverse reactions: No Arrival Time: 14:07 Had a fall or experienced change in No Accompanied By: daughter activities of daily living that may affect Transfer Assistance: None risk of falls: Patient Identification Verified: Yes Signs or symptoms of abuse/neglect since last visito No Hospitalized since last visit: No Has Dressing in Place as Prescribed: Yes Pain Present Now: No Electronic Signature(s) Signed: 10/04/2019 9:13:28 AM By: Army Melia Entered By: Army Melia on 10/03/2019 14:08:07 [name], [name] ([id number]) -------------------------------------------------------------------------------- Clinic Level of Care Assessment Details Patient Name: [name] Date of Service: 10/03/2019 2:00 PM Medical Record Number: [id number] Patient Account Number: [id number] Date of Birth/Sex: 05/30/1966 (54 y.o. M) Treating RN: [name] Primary Care Rhone Ozaki: [name] Other Clinician: Referring Aspen Deterding: [name] Treating Emerie Vanderkolk/Extender: [name] in Treatment: 4 Clinic Level of Care Assessment Items TOOL 4 Quantity Score []  - Use when only an EandM is performed on FOLLOW-UP visit 0 ASSESSMENTS - Nursing Assessment / Reassessment X - Reassessment of Co-morbidities (includes updates in patient status) 1 10 X- 1 5 Reassessment of Adherence to Treatment Plan ASSESSMENTS - Wound and Skin  Assessment / Reassessment X - Simple Wound Assessment / Reassessment - one wound 1 5 []  - 0 Complex Wound Assessment / Reassessment - multiple wounds []  - 0 Dermatologic / Skin Assessment (not related to wound area) ASSESSMENTS - Focused Assessment []  - Circumferential Edema Measurements - multi extremities 0 []  - 0 Nutritional Assessment / Counseling / Intervention []  - 0 Lower Extremity Assessment (monofilament, tuning fork, pulses) []  - 0 Peripheral Arterial Disease Assessment (using hand held doppler) ASSESSMENTS - Ostomy and/or Continence Assessment and Care []  - Incontinence Assessment and Management 0 []  - 0 Ostomy Care Assessment and Management (repouching, etc.) PROCESS - Coordination of Care X - Simple Patient / Family Education for ongoing care 1 15 []  - 0 Complex (extensive) Patient / Family Education for ongoing care []  - 0 Staff obtains Programmer, systems, Records, Test Results / Process Orders []  - 0 Staff telephones HHA, Nursing Homes / Clarify orders / etc []  - 0 Routine Transfer to another Facility (non-emergent condition) []  - 0 Routine Hospital Admission (non-emergent condition) X- 1 15 New Admissions / Biomedical engineer / Ordering NPWT, Apligraf, etc. []  - 0 Emergency Hospital Admission (emergent condition) X- 1 10 Simple Discharge Coordination []  - 0 Complex (extensive) Discharge Coordination PROCESS - Special Needs []  - Pediatric / Minor Patient Management 0 []  - 0 Isolation Patient Management []  - 0 Hearing / Language / Visual special needs []  - 0 Assessment of Community assistance (transportation, D/C planning, etc.) [name], [name] ([id number]) []  - 0 Additional assistance / Altered mentation []  - 0 Support Surface(s) Assessment (bed, cushion, seat, etc.) INTERVENTIONS - Wound Cleansing / Measurement X - Simple Wound Cleansing - one wound 1 5 []  - 0 Complex Wound Cleansing - multiple wounds X- 1 5 Wound Imaging (photographs - any number of  wounds) []  - 0 Wound Tracing (instead of photographs) []  - 0 Simple Wound Measurement - one wound []  -  0 Complex Wound Measurement - multiple wounds INTERVENTIONS - Wound Dressings []  - Small Wound Dressing one or multiple wounds 0 X- 1 15 Medium Wound Dressing one or multiple wounds []  - 0 Large Wound Dressing one or multiple wounds []  - 0 Application of Medications - topical []  - 0 Application of Medications - injection INTERVENTIONS - Miscellaneous []  - External ear exam 0 []  - 0 Specimen Collection (cultures, biopsies, blood, body fluids, etc.) []  - 0 Specimen(s) / Culture(s) sent or taken to Lab for analysis []  - 0 Patient Transfer (multiple staff / Civil Service fast streamer / Similar devices) []  - 0 Simple Staple / Suture removal (25 or less) []  - 0 Complex Staple / Suture removal (26 or more) []  - 0 Hypo / Hyperglycemic Management (close monitor of Blood Glucose) []  - 0 Ankle / Brachial Index (ABI) - do not check if billed separately X- 1 5 Vital Signs Has the patient been seen at the hospital within the last three years: Yes Total Score: 90 Level Of Care: New/Established - Level 3 Electronic Signature(s) Signed: 10/03/2019 5:22:30 PM By: Gretta Cool, BSN, RN, CWS, Kim RN, BSN Entered By: Gretta Cool, BSN, RN, CWS, Kim on 10/03/2019 14:29:39 [name], [name] ([id number]) -------------------------------------------------------------------------------- Encounter Discharge Information Details Patient Name: [name], [name] Date of Service: 10/03/2019 2:00 PM Medical Record Number: [id number] Patient Account Number: [id number] Date of Birth/Sex: [birth year] (54 y.o. M) Treating RN: [name] Primary Care Beatryce Colombo: [name] Other Clinician: Referring Jacquelinne Speak: [name] Treating Bethan Adamek/Extender: [name] in Treatment: 4 Encounter Discharge Information Items Discharge Condition: Stable Ambulatory Status: Walker Discharge Destination: Home Transportation: Private  Auto Accompanied By: self Schedule Follow-up Appointment: Yes Clinical Summary of Care: Electronic Signature(s) Signed: 10/03/2019 5:22:30 PM By: Gretta Cool, BSN, RN, CWS, Kim RN, BSN Entered By: Gretta Cool, BSN, RN, CWS, Kim on 10/03/2019 14:31:06 [name], [name] ([id number]) -------------------------------------------------------------------------------- Lower Extremity Assessment Details Patient Name: Tacker, [name] Date of Service: 10/03/2019 2:00 PM Medical Record Number: [id number] Patient Account Number: [id number] Date of Birth/Sex: 1965-12-09 (54 y.o. M) Treating RN: Army Melia Primary Care Nico Syme: [name] Other Clinician: Referring Leshon Armistead: [name] Treating Annalisse Minkoff/Extender: Ricard Dillon Weeks in Treatment: 4 Electronic Signature(s) Signed: 10/04/2019 9:13:28 AM By: Army Melia Entered By: Army Melia on 10/03/2019 14:17:54 [name], [name] ([id number]) -------------------------------------------------------------------------------- Multi Wound Chart Details Patient Name: Courts, [name] Date of Service: 10/03/2019 2:00 PM Medical Record Number: [id number] Patient Account Number: [id number] Date of Birth/Sex: Apr 07, 1966 (54 y.o. M) Treating RN: [name] Primary Care Daemon Dowty: [name] Other Clinician: Referring Mira Balon: [name] Treating Terral Cooks/Extender: [name] in Treatment: 4 Vital Signs Height(in): 72 Pulse(bpm): 80 Weight(lbs): 230 Blood Pressure(mmHg): 127/70 Body Mass Index(BMI): 31 Temperature(F): 98.6 Respiratory Rate(breaths/min): 16 Photos: [N/A:N/A] Wound Location: Sacrum - Midline N/A N/A Wounding Event: Gradually Appeared N/A N/A Primary Etiology: Abscess N/A N/A Date Acquired: 07/24/2019 N/A N/A Weeks of Treatment: 4 N/A N/A Wound Status: Open N/A N/A Measurements L x W x D (cm) 1.8x3x3.3 N/A N/A Area (cm) : 4.241 N/A N/A Volume (cm) : 13.996 N/A N/A % Reduction in Area: 48.80% N/A N/A % Reduction in  Volume: 62.40% N/A N/A Position 1 (o'clock): 2 Maximum Distance 1 (cm): 5 Tunneling: Yes N/A N/A Classification: Full Thickness With Exposed N/A N/A Support Structures Exudate Amount: Medium N/A N/A Exudate Type: Serosanguineous N/A N/A Exudate Color: red, brown N/A N/A Wound Margin: Thickened N/A N/A Granulation Amount: Large (67-100%) N/A N/A Granulation Quality: Red N/A N/A Necrotic Amount: Small (  1-33%) N/A N/A Exposed Structures: Fat Layer (Subcutaneous Tissue) N/A N/A Exposed: Yes Muscle: Yes Fascia: No Tendon: No Joint: No Bone: No Epithelialization: None N/A N/A Treatment Notes Wound #1 (Midline Sacrum) Notes Wet to dry with hydrogel; bordered foam dressing. NPWT to be applied by home health. Cuyler, ZORION MOMON ([id number]) Electronic Signature(s) Signed: 10/03/2019 5:25:14 PM By: Linton Ham MD Entered By: Linton Ham on 10/03/2019 14:43:10 [name], [name] ([id number]) -------------------------------------------------------------------------------- [place] Details Patient Name: [name], [name] Date of Service: 10/03/2019 2:00 PM Medical Record Number: [id number] Patient Account Number: [id number] Date of Birth/Sex: 01/16/1966 (54 y.o. M) Treating RN: [name] Primary Care Litsy Epting: [name] Other Clinician: Referring Trevan Messman: [name] Treating Kyndal Gloster/Extender: [name] in Treatment: 4 Active Inactive Necrotic Tissue Nursing Diagnoses: Impaired tissue integrity related to necrotic/devitalized tissue Goals: Necrotic/devitalized tissue will be minimized in the wound bed Date Initiated: 09/05/2019 Target Resolution Date: 09/26/2019 Goal Status: Active Interventions: Assess patient pain level pre-, during and post procedure and prior to discharge Treatment Activities: Apply topical anesthetic as ordered : 09/05/2019 Notes: Orientation to the Wound Care Program Nursing Diagnoses: Knowledge deficit related to the  wound healing center program Goals: Patient/caregiver will verbalize understanding of the [place] Date Initiated: 09/05/2019 Target Resolution Date: 09/26/2019 Goal Status: Active Interventions: Provide education on orientation to the wound center Notes: Pressure Nursing Diagnoses: Knowledge deficit related to management of pressures ulcers Goals: Patient will remain free from development of additional pressure ulcers Date Initiated: 09/05/2019 Target Resolution Date: 10/03/2019 Goal Status: Active Patient/caregiver will verbalize risk factors for pressure ulcer development Date Initiated: 09/05/2019 Target Resolution Date: 09/26/2019 Goal Status: Active Patient/caregiver will verbalize understanding of pressure ulcer management Date Initiated: 09/05/2019 Target Resolution Date: 10/03/2019 Goal Status: Active Interventions: [name], [name] ([id number]) Assess: immobility, friction, shearing, incontinence upon admission and as needed Provide education on pressure ulcers Notes: Wound/Skin Impairment Nursing Diagnoses: Impaired tissue integrity Goals: Patient/caregiver will verbalize understanding of skin care regimen Date Initiated: 09/05/2019 Target Resolution Date: 09/26/2019 Goal Status: Active Ulcer/skin breakdown will have a volume reduction of 30% by week 4 Date Initiated: 09/05/2019 Target Resolution Date: 10/03/2019 Goal Status: Active Interventions: Assess ulceration(s) every visit Treatment Activities: Referred to DME Saryna Kneeland for dressing supplies : 09/05/2019 Notes: Electronic Signature(s) Signed: 10/03/2019 5:22:30 PM By: Gretta Cool, BSN, RN, CWS, Kim RN, BSN Entered By: Gretta Cool, BSN, RN, CWS, Kim on 10/03/2019 14:24:15 [name], [name] ([id number]) -------------------------------------------------------------------------------- Pain Assessment Details Patient Name: Leever, [name] Date of Service: 10/03/2019 2:00 PM Medical Record Number: [id number] Patient  Account Number: [id number] Date of Birth/Sex: 05/04/1966 (54 y.o. M) Treating RN: Army Melia Primary Care Calan Doren: [name] Other Clinician: Referring Pamela Maddy: [name] Treating Isabellarose Kope/Extender: [name] in Treatment: 4 Active Problems Location of Pain Severity and Description of Pain Patient Has Paino No Site Locations Pain Management and Medication Current Pain Management: Electronic Signature(s) Signed: 10/04/2019 9:13:28 AM By: Army Melia Entered By: Army Melia on 10/03/2019 14:14:59 Dillie, [name] ([id number]) -------------------------------------------------------------------------------- Patient/Caregiver Education Details Patient Name: Rademaker, [name] Date of Service: 10/03/2019 2:00 PM Medical Record Number: [id number] Patient Account Number: [id number] Date of Birth/Gender: 09/28/1965 (54 y.o. M) Treating RN: [name] Primary Care Physician: [name] Other Clinician: Referring Physician: [name] Treating Physician/Extender: [name] in Treatment: 4 Education Assessment Education Provided To: Patient Education Topics Provided Wound/Skin Impairment: Handouts: Caring for Your Ulcer Methods: Demonstration, Explain/Verbal Responses: State content correctly Electronic Signature(s) Signed: 10/03/2019 5:22:30 PM By:  Gretta Cool, BSN, RN, CWS, Leisure centre manager, BSN Entered By: Gretta Cool, BSN, RN, CWS, Kim on 10/03/2019 14:30:36 Mcnamee, [name] ([id number]) -------------------------------------------------------------------------------- Wound Assessment Details Patient Name: Ytuarte, Louie R. Date of Service: 10/03/2019 2:00 PM Medical Record Number: [id number] Patient Account Number: [id number] Date of Birth/Sex: January 02, 1966 (54 y.o. M) Treating RN: Army Melia Primary Care Kellie Chisolm: [name] Other Clinician: Referring Keylah Darwish: [name] Treating Arrie Borrelli/Extender: [name] in Treatment: 4 Wound Status Wound  Number: 1 Primary Etiology: Abscess Wound Location: Sacrum - Midline Wound Status: Open Wounding Event: Gradually Appeared Date Acquired: 07/24/2019 Weeks Of Treatment: 4 Clustered Wound: No Photos Wound Measurements Length: (cm) 1.8 Width: (cm) 3 Depth: (cm) 3.3 Area: (cm) 4.241 Volume: (cm) 13.996 % Reduction in Area: 48.8% % Reduction in Volume: 62.4% Epithelialization: None Tunneling: Yes Position (o'clock): 2 Maximum Distance: (cm) 5 Wound Description Classification: Full Thickness With Exposed Support Structures Wound Margin: Thickened Exudate Amount: Medium Exudate Type: Serosanguineous Exudate Color: red, brown Foul Odor After Cleansing: No Slough/Fibrino Yes Wound Bed Granulation Amount: Large (67-100%) Exposed Structure Granulation Quality: Red Fascia Exposed: No Necrotic Amount: Small (1-33%) Fat Layer (Subcutaneous Tissue) Exposed: Yes Necrotic Quality: Adherent Slough Tendon Exposed: No Muscle Exposed: Yes Necrosis of Muscle: No Joint Exposed: No Bone Exposed: No Treatment Notes Wound #1 (Midline Sacrum) Notes Wet to dry with hydrogel; bordered foam dressing. NPWT to be applied by home health. Duffett, MARCELLIUS HAGLE ([id number]) Electronic Signature(s) Signed: 10/04/2019 9:13:28 AM By: Army Melia Entered By: Army Melia on 10/03/2019 14:17:15 Markman, [name] ([id number]) -------------------------------------------------------------------------------- Vitals Details Patient Name: Saetern, [name] Date of Service: 10/03/2019 2:00 PM Medical Record Number: [id number] Patient Account Number: [id number] Date of Birth/Sex: 16-Mar-1966 (54 y.o. M) Treating RN: Army Melia Primary Care Saifullah Jolley: [name] Other Clinician: Referring Dajai Wahlert: [name] Treating Amaury Kuzel/Extender: [name] in Treatment: 4 Vital Signs Time Taken: 14:08 Temperature (F): 98.6 Height (in): 72 Pulse (bpm): 80 Weight (lbs): 230 Respiratory Rate  (breaths/min): 16 Body Mass Index (BMI): 31.2 Blood Pressure (mmHg): 127/70 Reference Range: 80 - 120 mg / dl Electronic Signature(s) Signed: 10/04/2019 9:13:28 AM By: Army Melia Entered By: Army Melia on 10/03/2019 14:10:27

## 2019-10-05 DIAGNOSIS — M5144 Schmorl's nodes, thoracic region: Secondary | ICD-10-CM | POA: Diagnosis not present

## 2019-10-05 DIAGNOSIS — G061 Intraspinal abscess and granuloma: Secondary | ICD-10-CM | POA: Diagnosis not present

## 2019-10-05 DIAGNOSIS — L89894 Pressure ulcer of other site, stage 4: Secondary | ICD-10-CM | POA: Diagnosis not present

## 2019-10-05 DIAGNOSIS — M4316 Spondylolisthesis, lumbar region: Secondary | ICD-10-CM | POA: Diagnosis not present

## 2019-10-05 DIAGNOSIS — M48061 Spinal stenosis, lumbar region without neurogenic claudication: Secondary | ICD-10-CM | POA: Diagnosis not present

## 2019-10-05 DIAGNOSIS — M4604 Spinal enthesopathy, thoracic region: Secondary | ICD-10-CM | POA: Diagnosis not present

## 2019-10-05 DIAGNOSIS — L89154 Pressure ulcer of sacral region, stage 4: Secondary | ICD-10-CM | POA: Diagnosis not present

## 2019-10-05 DIAGNOSIS — M4646 Discitis, unspecified, lumbar region: Secondary | ICD-10-CM | POA: Diagnosis not present

## 2019-10-05 DIAGNOSIS — B9561 Methicillin susceptible Staphylococcus aureus infection as the cause of diseases classified elsewhere: Secondary | ICD-10-CM | POA: Diagnosis not present

## 2019-10-05 DIAGNOSIS — K6812 Psoas muscle abscess: Secondary | ICD-10-CM | POA: Diagnosis not present

## 2019-10-05 DIAGNOSIS — G834 Cauda equina syndrome: Secondary | ICD-10-CM | POA: Diagnosis not present

## 2019-10-05 DIAGNOSIS — I081 Rheumatic disorders of both mitral and tricuspid valves: Secondary | ICD-10-CM | POA: Diagnosis not present

## 2019-10-05 DIAGNOSIS — M4804 Spinal stenosis, thoracic region: Secondary | ICD-10-CM | POA: Diagnosis not present

## 2019-10-05 DIAGNOSIS — M47816 Spondylosis without myelopathy or radiculopathy, lumbar region: Secondary | ICD-10-CM | POA: Diagnosis not present

## 2019-10-05 DIAGNOSIS — T8142XA Infection following a procedure, deep incisional surgical site, initial encounter: Secondary | ICD-10-CM | POA: Diagnosis not present

## 2019-10-05 DIAGNOSIS — R7881 Bacteremia: Secondary | ICD-10-CM | POA: Diagnosis not present

## 2019-10-05 DIAGNOSIS — L89312 Pressure ulcer of right buttock, stage 2: Secondary | ICD-10-CM | POA: Diagnosis not present

## 2019-10-06 DIAGNOSIS — L89154 Pressure ulcer of sacral region, stage 4: Secondary | ICD-10-CM | POA: Diagnosis not present

## 2019-10-07 DIAGNOSIS — L89154 Pressure ulcer of sacral region, stage 4: Secondary | ICD-10-CM | POA: Diagnosis not present

## 2019-10-08 ENCOUNTER — Telehealth: Payer: Self-pay | Admitting: Orthopaedic Surgery

## 2019-10-08 ENCOUNTER — Telehealth: Payer: Self-pay

## 2019-10-08 DIAGNOSIS — M4804 Spinal stenosis, thoracic region: Secondary | ICD-10-CM | POA: Diagnosis not present

## 2019-10-08 DIAGNOSIS — L89154 Pressure ulcer of sacral region, stage 4: Secondary | ICD-10-CM | POA: Diagnosis not present

## 2019-10-08 DIAGNOSIS — M48061 Spinal stenosis, lumbar region without neurogenic claudication: Secondary | ICD-10-CM | POA: Diagnosis not present

## 2019-10-08 DIAGNOSIS — T8142XA Infection following a procedure, deep incisional surgical site, initial encounter: Secondary | ICD-10-CM | POA: Diagnosis not present

## 2019-10-08 DIAGNOSIS — R7881 Bacteremia: Secondary | ICD-10-CM | POA: Diagnosis not present

## 2019-10-08 DIAGNOSIS — G834 Cauda equina syndrome: Secondary | ICD-10-CM | POA: Diagnosis not present

## 2019-10-08 DIAGNOSIS — K6812 Psoas muscle abscess: Secondary | ICD-10-CM | POA: Diagnosis not present

## 2019-10-08 DIAGNOSIS — M5144 Schmorl's nodes, thoracic region: Secondary | ICD-10-CM | POA: Diagnosis not present

## 2019-10-08 DIAGNOSIS — M4316 Spondylolisthesis, lumbar region: Secondary | ICD-10-CM | POA: Diagnosis not present

## 2019-10-08 DIAGNOSIS — L89312 Pressure ulcer of right buttock, stage 2: Secondary | ICD-10-CM | POA: Diagnosis not present

## 2019-10-08 DIAGNOSIS — M4646 Discitis, unspecified, lumbar region: Secondary | ICD-10-CM | POA: Diagnosis not present

## 2019-10-08 DIAGNOSIS — M47816 Spondylosis without myelopathy or radiculopathy, lumbar region: Secondary | ICD-10-CM | POA: Diagnosis not present

## 2019-10-08 DIAGNOSIS — M4604 Spinal enthesopathy, thoracic region: Secondary | ICD-10-CM | POA: Diagnosis not present

## 2019-10-08 DIAGNOSIS — I081 Rheumatic disorders of both mitral and tricuspid valves: Secondary | ICD-10-CM | POA: Diagnosis not present

## 2019-10-08 DIAGNOSIS — G061 Intraspinal abscess and granuloma: Secondary | ICD-10-CM | POA: Diagnosis not present

## 2019-10-08 DIAGNOSIS — L89894 Pressure ulcer of other site, stage 4: Secondary | ICD-10-CM | POA: Diagnosis not present

## 2019-10-08 DIAGNOSIS — B9561 Methicillin susceptible Staphylococcus aureus infection as the cause of diseases classified elsewhere: Secondary | ICD-10-CM | POA: Diagnosis not present

## 2019-10-08 NOTE — Telephone Encounter (Signed)
Spoke with [name] and advised as below. [name] states patient had appointment to follow up with Dr Saintclair Halsted after surgery in December but had to cancel it because the same week of that appointment patient ended up at the hospital again. On that D/C paper it did not mention to follow up with neurosurgeon so they did not realize this needed to be done still.  I called Dr Windy Carina office and spoke with his MA. Patient scheduled to see Dr Saintclair Halsted on 10/11/19 at 9:45 am. I advised [name] of everything. Schedule patient to follow up with Dr Einar Pheasant on 10/09/19 for eye issue and to make sure lt leg pain is ok to wait till Thursday or if it is more emergent.

## 2019-10-08 NOTE — Telephone Encounter (Signed)
Agree with plan, appreciate quick neurosurgery appointment

## 2019-10-08 NOTE — Telephone Encounter (Signed)
Patient's daughter called. She has some questions. Her call back number is [phone]

## 2019-10-08 NOTE — Telephone Encounter (Signed)
[name] pts daughter(DPR signed) left v/m requesting cb; I spoke with [name] and she is concerned about last visit and lt leg hurting; the lt leg has been hurting for 3-4 wks and seems to be worsening. Pt having more problems getting up from sitting position. The lt hip starts  Hurting and goes down 1/2 way on the thigh on and off. [name] is not sure if pain is sharp or dull. No redness and no swelling in upper leg. Pt was hurting in upper lt leg about 2 hours ago at pain level of 6-7. Pt is not able to sleep at night due to upper leg hurting. [name] said for few days to 1 wk pt has had lt eye being blurred and now the lt eye is blurred constantly to the point if pt closes the rt eye pt cannot see any object in the lt eye. [name] said that pt leg pain is not as bad as when admitted back in December 2020 but leg pain is worsening and pt is having problems standing if in sitting position almost like he was prior to going into hospital. [name] request cb after Dr Einar Pheasant reviews note.  Pt has no covid symptoms, no travel and no known exposure to + covid. [name] also noted that pts wound vac is smelling. The [facility] nurse was there today and changed the wound; [name] will contact Dr [name] at wound care in [place] about that. [name] did not want to schedule appt until sent note to Dr Einar Pheasant.

## 2019-10-08 NOTE — Telephone Encounter (Signed)
There are 2 issues occurring.   1) Blurry vision and eye symptoms -- recommend appointment to discuss. If established with an eye doctor could also see them.   2) Leg pain -- have they seen the Neurosurgeon since the surgery? It looks like he should have followed up with them   Kary Kos, MD Follow up.   Specialty: Neurosurgery Why: Call for appointment Contact information: [address] [place] [address] [phone]   -- Would recommend calling Neurosurgery -- If unable to get in quickly, let me know. I think we should probably consider repeating imaging but I suspect that will be more quickly obtained through the Neurosurgeon's office

## 2019-10-09 ENCOUNTER — Other Ambulatory Visit: Payer: Self-pay

## 2019-10-09 ENCOUNTER — Encounter: Payer: Self-pay | Admitting: Family Medicine

## 2019-10-09 ENCOUNTER — Telehealth: Payer: Self-pay

## 2019-10-09 ENCOUNTER — Ambulatory Visit: Payer: BC Managed Care – PPO | Admitting: Family Medicine

## 2019-10-09 VITALS — BP 94/70 | HR 91 | Temp 97.7°F | Resp 12 | Ht 72.0 in | Wt 224.5 lb

## 2019-10-09 DIAGNOSIS — G061 Intraspinal abscess and granuloma: Secondary | ICD-10-CM | POA: Diagnosis not present

## 2019-10-09 DIAGNOSIS — G834 Cauda equina syndrome: Secondary | ICD-10-CM

## 2019-10-09 DIAGNOSIS — M25552 Pain in left hip: Secondary | ICD-10-CM

## 2019-10-09 DIAGNOSIS — H538 Other visual disturbances: Secondary | ICD-10-CM

## 2019-10-09 DIAGNOSIS — L89154 Pressure ulcer of sacral region, stage 4: Secondary | ICD-10-CM | POA: Diagnosis not present

## 2019-10-09 DIAGNOSIS — Z96643 Presence of artificial hip joint, bilateral: Secondary | ICD-10-CM

## 2019-10-09 LAB — COMPREHENSIVE METABOLIC PANEL
ALT: 7 U/L (ref 0–53)
AST: 10 U/L (ref 0–37)
Albumin: 3.4 g/dL — ABNORMAL LOW (ref 3.5–5.2)
Alkaline Phosphatase: 86 U/L (ref 39–117)
BUN: 7 mg/dL (ref 6–23)
CO2: 30 mEq/L (ref 19–32)
Calcium: 9.6 mg/dL (ref 8.4–10.5)
Chloride: 102 mEq/L (ref 96–112)
Creatinine, Ser: 0.7 mg/dL (ref 0.40–1.50)
GFR: 117.37 mL/min (ref >60.00)
Glucose, Bld: 107 mg/dL — ABNORMAL HIGH (ref 70–99)
Potassium: 4.4 mEq/L (ref 3.5–5.1)
Sodium: 138 mEq/L (ref 135–145)
Total Bilirubin: 0.5 mg/dL (ref 0.2–1.2)
Total Protein: 7.3 g/dL (ref 6.0–8.3)

## 2019-10-09 LAB — CBC WITH DIFFERENTIAL/PLATELET
Basophils Absolute: 0.1 10*3/uL (ref 0.0–0.1)
Basophils Relative: 1 % (ref 0.0–3.0)
Eosinophils Absolute: 0.3 10*3/uL (ref 0.0–0.7)
Eosinophils Relative: 3.7 % (ref 0.0–5.0)
HCT: 32.6 % — ABNORMAL LOW (ref 39.0–52.0)
Hemoglobin: 11 g/dL — ABNORMAL LOW (ref 13.0–17.0)
Lymphocytes Relative: 16.7 % (ref 12.0–46.0)
Lymphs Abs: 1.5 10*3/uL (ref 0.7–4.0)
MCHC: 33.7 g/dL (ref 30.0–36.0)
MCV: 87 fl (ref 78.0–100.0)
Monocytes Absolute: 0.4 10*3/uL (ref 0.1–1.0)
Monocytes Relative: 5 % (ref 3.0–12.0)
Neutro Abs: 6.4 10*3/uL (ref 1.4–7.7)
Neutrophils Relative %: 73.6 % (ref 43.0–77.0)
Platelets: 510 10*3/uL — ABNORMAL HIGH (ref 150.0–400.0)
RBC: 3.75 Mil/uL — ABNORMAL LOW (ref 4.22–5.81)
RDW: 15.5 % (ref 11.5–15.5)
WBC: 8.7 10*3/uL (ref 4.0–10.5)

## 2019-10-09 LAB — SEDIMENTATION RATE: Sed Rate: 87 mm/hr — ABNORMAL HIGH (ref 0–20)

## 2019-10-09 LAB — HIGH SENSITIVITY CRP: CRP, High Sensitivity: 75.66 mg/L — ABNORMAL HIGH (ref 0.000–5.000)

## 2019-10-09 NOTE — Progress Notes (Signed)
Subjective:     [name] is a 54 y.o. male presenting for Blurred Vision and Leg Pain (left leg, pain getting worse)     HPI  #left leg - worse at night - when he lays down at 10-11 pm, tossing and turning until 4  - same pain  - tries heat/ice/position changes - this has been occurring since his leg fell off the stool - when he sits down on a stool or something lower than a toilet it will be worsening pain - sharp pain which will improve if he doesn't move - difficulty to pinpoint where the pain  - feels like the pain that occurs at night is in the hip and the inner - s/p b/l hip replacement   #Blurry vision - left eye - when he covers his he notices that it is blurry - noticed it last week - went to eye doctor in the past year for prescription glasses that he does not wear - at the eye doctor the vision was similar in both eyes - no pain in the eye - no redness/itching  Review of Systems   Social History   Tobacco Use  Smoking Status Former Smoker  . Packs/day: 1.00  . Years: 14.00  . Pack years: 14.00  . Types: Cigarettes  . Quit date: 07/27/1999  . Years since quitting: 20.2  Smokeless Tobacco Never Used        Objective:    BP Readings from Last 3 Encounters:  10/09/19 94/70  10/02/19 118/76  09/04/19 104/72   Wt Readings from Last 3 Encounters:  10/09/19 224 lb 8 oz (101.8 kg)  10/02/19 227 lb 12 oz (103.3 kg)  09/17/19 230 lb (104.3 kg)    BP 94/70   Pulse 91   Temp 97.7 F (36.5 C)   Resp 12   Ht 6' (1.829 m)   Wt 224 lb 8 oz (101.8 kg)   SpO2 94%   BMI 30.45 kg/m    Physical Exam Constitutional:      Appearance: Normal appearance. He is not ill-appearing or diaphoretic.     Comments: Moves slowly, ambulates with walker, wound vac.   HENT:     Right Ear: External ear normal.     Left Ear: External ear normal.     Nose: Nose normal.  Eyes:     General: Lids are normal. No scleral icterus.       Right eye: No discharge.         Left eye: No discharge.     Extraocular Movements: Extraocular movements intact.     Conjunctiva/sclera: Conjunctivae normal.     Comments: Left pupil is sluggish with consensual response but normal on direct.   Cardiovascular:     Rate and Rhythm: Normal rate.  Pulmonary:     Effort: Pulmonary effort is normal.  Musculoskeletal:     Cervical back: Neck supple.     Comments: Back:  No midline tenderness to light palpation Endorses some tenderness of the left gluteal region adjacent to his pressure ulcer Hip:  No TTP on anterior or lateral hip b/l ROM for internal/external rotation limited on the left compared to right - unclear if pain is triggered by this Strength: weak hip flexors, hamstring strength normal w/o worsening pain  Skin:    General: Skin is warm and dry.  Neurological:     Mental Status: He is alert. Mental status is at baseline.  Psychiatric:  Mood and Affect: Mood normal.           Assessment & Plan:   Problem List Items Addressed This Visit      Nervous and Auditory   H/O MSSA epidural abscess, L2-L5   Relevant Orders   Comprehensive metabolic panel   CBC with Differential   High sensitivity CRP   Sedimentation rate   Cauda equina syndrome (HCC) - Primary    Strength continues to improve but given new and worsening hip pain will repeat infectious blood work to assess      Relevant Orders   Comprehensive metabolic panel   CBC with Differential   High sensitivity CRP   Sedimentation rate     Other   S/P total hip arthroplasty   Left hip pain    Complex medical hx. S/p hip replacement and s/p lumbar surgery and infection. Now with worsening hip pain following injury 4-5 weeks ago. Given exam wonder about hip pathology (limited ROM) vs gluteal muscle injury given pain. Though given hx of back surgery and no NSG f/u encouraged starting with NSG and getting repeat blood work to rule out re-infection. Seeing ID follow-up tomorrow and NSG  Thursday. If infectious work-up is worse, may consider ER evaluation to get more urgent repeat imaging and treatment if indicated. If NSG and infectious work-up do not present an answer may consider returning to see his hip orthopedic doctor vs hip MRI to evaluate for muscle/hip pathology      Blurry vision, left eye    New and fairly sudden onset per patient. Vision testing not done today, as he lost his glasses. Given unilateral presentation and overall risk factors urgent referral for ophthalmology to further evaluate. Denies floaters or complete loss, just blurry.       Relevant Orders   Ambulatory referral to Ophthalmology       Return if symptoms worsen or fail to improve.  Lesleigh Noe, MD

## 2019-10-09 NOTE — Patient Instructions (Signed)
#  Blurry vision - Referral to the ophthalmologist for as soon as possible - If you have sudden vision loss - go to the ER   #Hip pain - Labs today to look for infection worsening - If worsening may need plan for return to ER to get more urgent imaging and evaluation  - Start with neurosurgeon -- if they do not think imaging is necessary can call me and can plan for imaging or see your orthopedic doctor -- I wonder if you injured your Glute muscles or your hip in some way which is causing the pain  But lets check for infection with blood work

## 2019-10-09 NOTE — Telephone Encounter (Signed)
Patient's daughter called in with questions. She has [name] scheduled to see his PCP, Neurologist, and infectious disease this week. She was not sure if she also needed [place] to see him as well, or if she should wait to see what these doctors tell her. I told her that she had a good plan, and if we need to see him to just let us know.

## 2019-10-09 NOTE — Assessment & Plan Note (Signed)
Complex medical hx. S/p hip replacement and s/p lumbar surgery and infection. Now with worsening hip pain following injury 4-5 weeks ago. Given exam wonder about hip pathology (limited ROM) vs gluteal muscle injury given pain. Though given hx of back surgery and no NSG f/u encouraged starting with NSG and getting repeat blood work to rule out re-infection. Seeing ID follow-up tomorrow and NSG Thursday. If infectious work-up is worse, may consider ER evaluation to get more urgent repeat imaging and treatment if indicated. If NSG and infectious work-up do not present an answer may consider returning to see his hip orthopedic doctor vs hip MRI to evaluate for muscle/hip pathology

## 2019-10-09 NOTE — Assessment & Plan Note (Signed)
New and fairly sudden onset per patient. Vision testing not done today, as he lost his glasses. Given unilateral presentation and overall risk factors urgent referral for ophthalmology to further evaluate. Denies floaters or complete loss, just blurry.

## 2019-10-09 NOTE — Assessment & Plan Note (Signed)
Strength continues to improve but given new and worsening hip pain will repeat infectious blood work to assess

## 2019-10-10 ENCOUNTER — Ambulatory Visit (INDEPENDENT_AMBULATORY_CARE_PROVIDER_SITE_OTHER): Payer: BC Managed Care – PPO | Admitting: Internal Medicine

## 2019-10-10 ENCOUNTER — Encounter: Payer: Self-pay | Admitting: Internal Medicine

## 2019-10-10 DIAGNOSIS — L89312 Pressure ulcer of right buttock, stage 2: Secondary | ICD-10-CM | POA: Diagnosis not present

## 2019-10-10 DIAGNOSIS — T8142XA Infection following a procedure, deep incisional surgical site, initial encounter: Secondary | ICD-10-CM | POA: Diagnosis not present

## 2019-10-10 DIAGNOSIS — M4316 Spondylolisthesis, lumbar region: Secondary | ICD-10-CM | POA: Diagnosis not present

## 2019-10-10 DIAGNOSIS — L89894 Pressure ulcer of other site, stage 4: Secondary | ICD-10-CM | POA: Diagnosis not present

## 2019-10-10 DIAGNOSIS — G834 Cauda equina syndrome: Secondary | ICD-10-CM | POA: Diagnosis not present

## 2019-10-10 DIAGNOSIS — B9561 Methicillin susceptible Staphylococcus aureus infection as the cause of diseases classified elsewhere: Secondary | ICD-10-CM | POA: Diagnosis not present

## 2019-10-10 DIAGNOSIS — M48061 Spinal stenosis, lumbar region without neurogenic claudication: Secondary | ICD-10-CM | POA: Diagnosis not present

## 2019-10-10 DIAGNOSIS — H25042 Posterior subcapsular polar age-related cataract, left eye: Secondary | ICD-10-CM | POA: Diagnosis not present

## 2019-10-10 DIAGNOSIS — M4646 Discitis, unspecified, lumbar region: Secondary | ICD-10-CM

## 2019-10-10 DIAGNOSIS — I081 Rheumatic disorders of both mitral and tricuspid valves: Secondary | ICD-10-CM | POA: Diagnosis not present

## 2019-10-10 DIAGNOSIS — M47816 Spondylosis without myelopathy or radiculopathy, lumbar region: Secondary | ICD-10-CM | POA: Diagnosis not present

## 2019-10-10 DIAGNOSIS — M5144 Schmorl's nodes, thoracic region: Secondary | ICD-10-CM | POA: Diagnosis not present

## 2019-10-10 DIAGNOSIS — R7881 Bacteremia: Secondary | ICD-10-CM | POA: Diagnosis not present

## 2019-10-10 DIAGNOSIS — M4604 Spinal enthesopathy, thoracic region: Secondary | ICD-10-CM | POA: Diagnosis not present

## 2019-10-10 DIAGNOSIS — G061 Intraspinal abscess and granuloma: Secondary | ICD-10-CM | POA: Diagnosis not present

## 2019-10-10 DIAGNOSIS — M4804 Spinal stenosis, thoracic region: Secondary | ICD-10-CM | POA: Diagnosis not present

## 2019-10-10 DIAGNOSIS — K6812 Psoas muscle abscess: Secondary | ICD-10-CM | POA: Diagnosis not present

## 2019-10-10 DIAGNOSIS — L89154 Pressure ulcer of sacral region, stage 4: Secondary | ICD-10-CM | POA: Diagnosis not present

## 2019-10-10 NOTE — Progress Notes (Signed)
[facility]  Patient Active Problem List   Diagnosis Date Noted  . Left hip pain 10/09/2019  . Blurry vision, left eye 10/09/2019  . Infected pressure ulcer 08/20/2019  . Psoas abscess, right ([place]) 08/20/2019  . Hyponatremia 08/20/2019  . Pressure injury of skin 08/02/2019  . Cauda equina syndrome ([place]) 07/31/2019  . Lumbar discitis 07/30/2019  . Cerebral septic emboli ([place]) 07/30/2019  . S/P lumbar laminectomy 07/25/2019  . H/O MSSA epidural abscess, L2-L5 07/25/2019  . Acute bilateral low back pain without sciatica 07/18/2019  . Abdominal pain 07/18/2019  . Anemia 07/18/2019  . Obesity (BMI 35.0-39.9 without comorbidity) 07/18/2019  . Constipation 07/18/2019  . Bilateral leg pain 07/12/2019  . Bilateral leg edema 04/01/2017  . Elevated blood sugar 04/01/2017  . Avascular necrosis of bone of hip, left ([place]) 10/12/2016  . Alcohol abuse, in remission 10/12/2016  . S/P total hip arthroplasty 10/12/2016    Patient's Medications  New Prescriptions   No medications on file  Previous Medications   ACETAMINOPHEN (TYLENOL) 325 MG TABLET    Take 2 tablets (650 mg total) by mouth every 4 (four) hours as needed for mild pain ((score 1 to 3) or temp > 100.5).   CYCLOBENZAPRINE (FLEXERIL) 10 MG TABLET    Take 1 tablet (10 mg total) by mouth 3 (three) times daily as needed for muscle spasms.   OXYCODONE HCL 10 MG TABS    Take 1 tablet (10 mg total) by mouth every 4 (four) hours as needed (For pain).   OXYCODONE HCL 10 MG TABS    Take 1 tablet (10 mg total) by mouth 3 (three) times daily as needed.  Modified Medications   No medications on file  Discontinued Medications   No medications on file    Subjective: Mr. Warriner is in with his daughter for his routine follow-up visit.  He was hospitalized in December with MSSA bacteremia complicated by a lumbar infection and cauda equina syndrome.  He underwent lumbar decompression.  There was concerns  about the possibility of CNS emboli although there was never any clear evidence of endocarditis by exam or TTE.  TEE was not pursued because it was felt that this would not change his management or outcome.  He received 4 weeks of IV nafcillin before switching to ampicillin sulbactam.  The switch was made because he had developed a sacral pressure sore that appeared infected.  He completed 54 days of IV antibiotic therapy on 09/17/2019.    His back pain was improving slowly.  About 3 weeks ago his left foot slipped off a stool while he was taking a shower.  As soon as his foot hit the floor he had shooting pain in his left hip and thigh.  He has had severe, intermittent pain since that time.  He had a similar episode about a week later when he was resting his left foot on the side of the bathtub while he was toweling off.  His foot slipped again, hitting the floor and causing severe pain.  The pain in his left hip and thigh is worse at night.  This pain is unlike the pain he had in his lower back when he went into the hospital in December.  His sacral wound is improving.  However they have noticed a foul odor to his wound VAC drainage over the past few days.  There has not been any change in the color or amount  of drainage.  Review of Systems: Review of Systems  Constitutional: Positive for malaise/fatigue. Negative for fever and weight loss.  Respiratory: Negative for cough and shortness of breath.   Cardiovascular: Negative for chest pain.  Gastrointestinal: Positive for constipation. Negative for abdominal pain, diarrhea, nausea and vomiting.  Musculoskeletal: Positive for back pain.  Skin: Negative for rash.  Neurological: Negative for focal weakness.    Past Medical History:  Diagnosis Date  . History of chicken pox   . Medical history non-contributory     Social History   Tobacco Use  . Smoking status: Former Smoker    Packs/day: 1.00    Years: 14.00    Pack years: 14.00    Types:  Cigarettes    Quit date: 07/27/1999    Years since quitting: 20.2  . Smokeless tobacco: Never Used  Substance Use Topics  . Alcohol use: Yes    Alcohol/week: 24.0 standard drinks    Types: 24 Cans of beer per week    Comment: 4-5 beers per day, case of beer per week  . Drug use: No    Family History  Problem Relation Age of Onset  . Cancer Father        Hodgkin's disease  . COPD Mother   . Heart attack Maternal Grandfather 80  . Diabetes Neg Hx   . Stroke Neg Hx   . Hypertension Neg Hx   . Hyperlipidemia Neg Hx     Allergies  Allergen Reactions  . Bee Venom Anaphylaxis  . Hydrocodone Nausea And Vomiting    Objective: Vitals:   10/10/19 1434  BP: 101/63  Pulse: 90  Temp: 97.8 F (36.6 C)  TempSrc: Oral  Weight: 225 lb 9.6 oz (102.3 kg)   Body mass index is 30.6 kg/m.  Physical Exam Constitutional:      Comments: He appears uncomfortable due to pain.  He is accompanied by his daughter.  Cardiovascular:     Rate and Rhythm: Normal rate and regular rhythm.     Heart sounds: No murmur.  Pulmonary:     Effort: Pulmonary effort is normal.     Breath sounds: Normal breath sounds.  Musculoskeletal:     Comments: His lumbar surgical incision is healed without signs of infection.  He has a wound VAC on his sacral wound.  There is a small amount of reddish-brown drainage in the canister.  Skin:    Findings: No rash.     Sed Rate  Date Value  10/09/2019 87 mm/hr (H)  09/03/2019 >130 mm/h (H)  08/13/2019 >140 mm/hr (H)   CRP  Date Value  09/03/2019 68.5 mg/L (H)  08/13/2019 18.3 mg/dL (H)  08/06/2019 8.8 mg/dL (H)      Problem List Items Addressed This Visit      Unprioritized   Lumbar discitis    His current left hip and leg pain began after his foot slipped off a stool and feels very different to him than the pain he had when he was admitted to the hospital with lumbar infection.  Nonetheless, his inflammatory markers remain elevated and I am concerned  about the possibility of relapsed infection.  I will obtain a repeat lumbar MRI.      Relevant Orders   MR Lumbar Spine W Wo Contrast       Michel Bickers, MD [facility] [phone] pager   [phone] cell 10/10/2019, 3:25 PM

## 2019-10-10 NOTE — Assessment & Plan Note (Signed)
His current left hip and leg pain began after his foot slipped off a stool and feels very different to him than the pain he had when he was admitted to the hospital with lumbar infection.  Nonetheless, his inflammatory markers remain elevated and I am concerned about the possibility of relapsed infection.  I will obtain a repeat lumbar MRI.

## 2019-10-11 DIAGNOSIS — L89312 Pressure ulcer of right buttock, stage 2: Secondary | ICD-10-CM | POA: Diagnosis not present

## 2019-10-11 DIAGNOSIS — M48061 Spinal stenosis, lumbar region without neurogenic claudication: Secondary | ICD-10-CM | POA: Diagnosis not present

## 2019-10-11 DIAGNOSIS — R7881 Bacteremia: Secondary | ICD-10-CM | POA: Diagnosis not present

## 2019-10-11 DIAGNOSIS — I081 Rheumatic disorders of both mitral and tricuspid valves: Secondary | ICD-10-CM | POA: Diagnosis not present

## 2019-10-11 DIAGNOSIS — M4804 Spinal stenosis, thoracic region: Secondary | ICD-10-CM | POA: Diagnosis not present

## 2019-10-11 DIAGNOSIS — M4316 Spondylolisthesis, lumbar region: Secondary | ICD-10-CM | POA: Diagnosis not present

## 2019-10-11 DIAGNOSIS — L89894 Pressure ulcer of other site, stage 4: Secondary | ICD-10-CM | POA: Diagnosis not present

## 2019-10-11 DIAGNOSIS — B9561 Methicillin susceptible Staphylococcus aureus infection as the cause of diseases classified elsewhere: Secondary | ICD-10-CM | POA: Diagnosis not present

## 2019-10-11 DIAGNOSIS — M5144 Schmorl's nodes, thoracic region: Secondary | ICD-10-CM | POA: Diagnosis not present

## 2019-10-11 DIAGNOSIS — L89154 Pressure ulcer of sacral region, stage 4: Secondary | ICD-10-CM | POA: Diagnosis not present

## 2019-10-11 DIAGNOSIS — M4646 Discitis, unspecified, lumbar region: Secondary | ICD-10-CM | POA: Diagnosis not present

## 2019-10-11 DIAGNOSIS — M47816 Spondylosis without myelopathy or radiculopathy, lumbar region: Secondary | ICD-10-CM | POA: Diagnosis not present

## 2019-10-11 DIAGNOSIS — T8142XA Infection following a procedure, deep incisional surgical site, initial encounter: Secondary | ICD-10-CM | POA: Diagnosis not present

## 2019-10-11 DIAGNOSIS — G061 Intraspinal abscess and granuloma: Secondary | ICD-10-CM | POA: Diagnosis not present

## 2019-10-11 DIAGNOSIS — G834 Cauda equina syndrome: Secondary | ICD-10-CM | POA: Diagnosis not present

## 2019-10-11 DIAGNOSIS — M4604 Spinal enthesopathy, thoracic region: Secondary | ICD-10-CM | POA: Diagnosis not present

## 2019-10-11 DIAGNOSIS — K6812 Psoas muscle abscess: Secondary | ICD-10-CM | POA: Diagnosis not present

## 2019-10-12 ENCOUNTER — Telehealth: Payer: Self-pay

## 2019-10-12 DIAGNOSIS — K6812 Psoas muscle abscess: Secondary | ICD-10-CM | POA: Diagnosis not present

## 2019-10-12 DIAGNOSIS — M4646 Discitis, unspecified, lumbar region: Secondary | ICD-10-CM | POA: Diagnosis not present

## 2019-10-12 DIAGNOSIS — L89894 Pressure ulcer of other site, stage 4: Secondary | ICD-10-CM | POA: Diagnosis not present

## 2019-10-12 DIAGNOSIS — M4604 Spinal enthesopathy, thoracic region: Secondary | ICD-10-CM | POA: Diagnosis not present

## 2019-10-12 DIAGNOSIS — I081 Rheumatic disorders of both mitral and tricuspid valves: Secondary | ICD-10-CM | POA: Diagnosis not present

## 2019-10-12 DIAGNOSIS — M5144 Schmorl's nodes, thoracic region: Secondary | ICD-10-CM | POA: Diagnosis not present

## 2019-10-12 DIAGNOSIS — M4804 Spinal stenosis, thoracic region: Secondary | ICD-10-CM | POA: Diagnosis not present

## 2019-10-12 DIAGNOSIS — L89154 Pressure ulcer of sacral region, stage 4: Secondary | ICD-10-CM | POA: Diagnosis not present

## 2019-10-12 DIAGNOSIS — M4316 Spondylolisthesis, lumbar region: Secondary | ICD-10-CM | POA: Diagnosis not present

## 2019-10-12 DIAGNOSIS — M48061 Spinal stenosis, lumbar region without neurogenic claudication: Secondary | ICD-10-CM | POA: Diagnosis not present

## 2019-10-12 DIAGNOSIS — T8142XA Infection following a procedure, deep incisional surgical site, initial encounter: Secondary | ICD-10-CM | POA: Diagnosis not present

## 2019-10-12 DIAGNOSIS — G834 Cauda equina syndrome: Secondary | ICD-10-CM | POA: Diagnosis not present

## 2019-10-12 DIAGNOSIS — L89312 Pressure ulcer of right buttock, stage 2: Secondary | ICD-10-CM | POA: Diagnosis not present

## 2019-10-12 DIAGNOSIS — B9561 Methicillin susceptible Staphylococcus aureus infection as the cause of diseases classified elsewhere: Secondary | ICD-10-CM | POA: Diagnosis not present

## 2019-10-12 DIAGNOSIS — M47816 Spondylosis without myelopathy or radiculopathy, lumbar region: Secondary | ICD-10-CM | POA: Diagnosis not present

## 2019-10-12 DIAGNOSIS — G061 Intraspinal abscess and granuloma: Secondary | ICD-10-CM | POA: Diagnosis not present

## 2019-10-12 DIAGNOSIS — R7881 Bacteremia: Secondary | ICD-10-CM | POA: Diagnosis not present

## 2019-10-12 NOTE — Telephone Encounter (Signed)
Diane nurse with [facility] left v/m giving FYI to Dr Einar Pheasant that next week Diane will fax recertification orders for signing ; pt has wound vac and being treated at wound center.

## 2019-10-12 NOTE — Telephone Encounter (Signed)
Noted, will review

## 2019-10-13 DIAGNOSIS — L89154 Pressure ulcer of sacral region, stage 4: Secondary | ICD-10-CM | POA: Diagnosis not present

## 2019-10-14 DIAGNOSIS — L89154 Pressure ulcer of sacral region, stage 4: Secondary | ICD-10-CM | POA: Diagnosis not present

## 2019-10-15 ENCOUNTER — Encounter: Payer: BC Managed Care – PPO | Admitting: Physician Assistant

## 2019-10-15 ENCOUNTER — Other Ambulatory Visit: Payer: Self-pay

## 2019-10-15 DIAGNOSIS — L0231 Cutaneous abscess of buttock: Secondary | ICD-10-CM | POA: Diagnosis not present

## 2019-10-15 DIAGNOSIS — X58XXXD Exposure to other specified factors, subsequent encounter: Secondary | ICD-10-CM | POA: Diagnosis not present

## 2019-10-15 DIAGNOSIS — M4604 Spinal enthesopathy, thoracic region: Secondary | ICD-10-CM | POA: Diagnosis not present

## 2019-10-15 DIAGNOSIS — M4316 Spondylolisthesis, lumbar region: Secondary | ICD-10-CM | POA: Diagnosis not present

## 2019-10-15 DIAGNOSIS — Z881 Allergy status to other antibiotic agents status: Secondary | ICD-10-CM | POA: Diagnosis not present

## 2019-10-15 DIAGNOSIS — L89894 Pressure ulcer of other site, stage 4: Secondary | ICD-10-CM | POA: Diagnosis not present

## 2019-10-15 DIAGNOSIS — M40204 Unspecified kyphosis, thoracic region: Secondary | ICD-10-CM | POA: Diagnosis not present

## 2019-10-15 DIAGNOSIS — M4646 Discitis, unspecified, lumbar region: Secondary | ICD-10-CM | POA: Diagnosis not present

## 2019-10-15 DIAGNOSIS — D72829 Elevated white blood cell count, unspecified: Secondary | ICD-10-CM | POA: Diagnosis not present

## 2019-10-15 DIAGNOSIS — M353 Polymyalgia rheumatica: Secondary | ICD-10-CM | POA: Diagnosis not present

## 2019-10-15 DIAGNOSIS — G4700 Insomnia, unspecified: Secondary | ICD-10-CM | POA: Diagnosis not present

## 2019-10-15 DIAGNOSIS — T8131XA Disruption of external operation (surgical) wound, not elsewhere classified, initial encounter: Secondary | ICD-10-CM | POA: Diagnosis not present

## 2019-10-15 DIAGNOSIS — L89312 Pressure ulcer of right buttock, stage 2: Secondary | ICD-10-CM | POA: Diagnosis not present

## 2019-10-15 DIAGNOSIS — Z8619 Personal history of other infectious and parasitic diseases: Secondary | ICD-10-CM | POA: Diagnosis not present

## 2019-10-15 DIAGNOSIS — M5144 Schmorl's nodes, thoracic region: Secondary | ICD-10-CM | POA: Diagnosis not present

## 2019-10-15 DIAGNOSIS — M48061 Spinal stenosis, lumbar region without neurogenic claudication: Secondary | ICD-10-CM | POA: Diagnosis not present

## 2019-10-15 DIAGNOSIS — N319 Neuromuscular dysfunction of bladder, unspecified: Secondary | ICD-10-CM | POA: Diagnosis not present

## 2019-10-15 DIAGNOSIS — J9811 Atelectasis: Secondary | ICD-10-CM | POA: Diagnosis not present

## 2019-10-15 DIAGNOSIS — M47816 Spondylosis without myelopathy or radiculopathy, lumbar region: Secondary | ICD-10-CM | POA: Diagnosis not present

## 2019-10-15 DIAGNOSIS — I081 Rheumatic disorders of both mitral and tricuspid valves: Secondary | ICD-10-CM | POA: Diagnosis not present

## 2019-10-15 DIAGNOSIS — G834 Cauda equina syndrome: Secondary | ICD-10-CM | POA: Diagnosis not present

## 2019-10-15 DIAGNOSIS — M4804 Spinal stenosis, thoracic region: Secondary | ICD-10-CM | POA: Diagnosis not present

## 2019-10-15 DIAGNOSIS — K6812 Psoas muscle abscess: Secondary | ICD-10-CM | POA: Diagnosis not present

## 2019-10-15 DIAGNOSIS — L89154 Pressure ulcer of sacral region, stage 4: Secondary | ICD-10-CM | POA: Diagnosis not present

## 2019-10-15 DIAGNOSIS — Z96642 Presence of left artificial hip joint: Secondary | ICD-10-CM | POA: Diagnosis not present

## 2019-10-15 DIAGNOSIS — T8131XD Disruption of external operation (surgical) wound, not elsewhere classified, subsequent encounter: Secondary | ICD-10-CM | POA: Diagnosis not present

## 2019-10-15 NOTE — Progress Notes (Signed)
[name], [name] ([id number]) Visit Report for 10/15/2019 Arrival Information Details Patient Name: [name], [name] Date of Service: 10/15/2019 10:45 AM Medical Record Number: [id number] Patient Account Number: [id number] Date of Birth/Sex: 04/16/1966 (54 y.o. M) Treating RN: [name] Primary Care Mava Suares: [name] Other Clinician: Referring Pharrell Ledford: [name] Treating Aitanna Haubner/Extender: Melburn Hake, HOYT Weeks in Treatment: 5 Visit Information History Since Last Visit Has Dressing in Place as Prescribed: Yes Patient Arrived: Walker Pain Present Now: No Arrival Time: 10:28 Accompanied By: daughter Transfer Assistance: None Patient Identification Verified: Yes Secondary Verification Process Completed: Yes Electronic Signature(s) Signed: 10/15/2019 5:11:36 PM By: Gretta Cool, BSN, RN, CWS, Kim RN, BSN Entered By: Gretta Cool, BSN, RN, CWS, Kim on 10/15/2019 10:28:51 [name], [name] ([id number]) -------------------------------------------------------------------------------- Clinic Level of Care Assessment Details Patient Name: [name], [name] Date of Service: 10/15/2019 10:45 AM Medical Record Number: [id number] Patient Account Number: [id number] Date of Birth/Sex: 05/27/1966 (54 y.o. M) Treating RN: Army Melia Primary Care Copeland Neisen: [name] Other Clinician: Referring Lateesha Bezold: [name] Treating Ammi Hutt/Extender: Melburn Hake, HOYT Weeks in Treatment: 5 Clinic Level of Care Assessment Items TOOL 4 Quantity Score []  - Use when only an EandM is performed on FOLLOW-UP visit 0 ASSESSMENTS - Nursing Assessment / Reassessment X - Reassessment of Co-morbidities (includes updates in patient status) 1 10 X- 1 5 Reassessment of Adherence to Treatment Plan ASSESSMENTS - Wound and Skin Assessment / Reassessment X - Simple Wound Assessment / Reassessment - one wound 1 5 []  - 0 Complex Wound Assessment / Reassessment - multiple wounds []  - 0 Dermatologic / Skin Assessment (not related  to wound area) ASSESSMENTS - Focused Assessment []  - Circumferential Edema Measurements - multi extremities 0 []  - 0 Nutritional Assessment / Counseling / Intervention []  - 0 Lower Extremity Assessment (monofilament, tuning fork, pulses) []  - 0 Peripheral Arterial Disease Assessment (using hand held doppler) ASSESSMENTS - Ostomy and/or Continence Assessment and Care []  - Incontinence Assessment and Management 0 []  - 0 Ostomy Care Assessment and Management (repouching, etc.) PROCESS - Coordination of Care X - Simple Patient / Family Education for ongoing care 1 15 []  - 0 Complex (extensive) Patient / Family Education for ongoing care X- 1 10 Staff obtains Programmer, systems, Records, Test Results / Process Orders []  - 0 Staff telephones HHA, Nursing Homes / Clarify orders / etc []  - 0 Routine Transfer to another Facility (non-emergent condition) []  - 0 Routine Hospital Admission (non-emergent condition) []  - 0 New Admissions / Biomedical engineer / Ordering NPWT, Apligraf, etc. []  - 0 Emergency Hospital Admission (emergent condition) X- 1 10 Simple Discharge Coordination []  - 0 Complex (extensive) Discharge Coordination PROCESS - Special Needs []  - Pediatric / Minor Patient Management 0 []  - 0 Isolation Patient Management []  - 0 Hearing / Language / Visual special needs []  - 0 Assessment of Community assistance (transportation, D/C planning, etc.) [place], Omar R. ([id number]) []  - 0 Additional assistance / Altered mentation []  - 0 Support Surface(s) Assessment (bed, cushion, seat, etc.) INTERVENTIONS - Wound Cleansing / Measurement X - Simple Wound Cleansing - one wound 1 5 []  - 0 Complex Wound Cleansing - multiple wounds X- 1 5 Wound Imaging (photographs - any number of wounds) []  - 0 Wound Tracing (instead of photographs) X- 1 5 Simple Wound Measurement - one wound []  - 0 Complex Wound Measurement - multiple wounds INTERVENTIONS - Wound Dressings []  - Small Wound  Dressing one or multiple wounds 0 X- 1 15 Medium Wound Dressing one or multiple wounds []  -  0 Large Wound Dressing one or multiple wounds []  - 0 Application of Medications - topical []  - 0 Application of Medications - injection INTERVENTIONS - Miscellaneous []  - External ear exam 0 []  - 0 Specimen Collection (cultures, biopsies, blood, body fluids, etc.) []  - 0 Specimen(s) / Culture(s) sent or taken to Lab for analysis []  - 0 Patient Transfer (multiple staff / Civil Service fast streamer / Similar devices) []  - 0 Simple Staple / Suture removal (25 or less) []  - 0 Complex Staple / Suture removal (26 or more) []  - 0 Hypo / Hyperglycemic Management (close monitor of Blood Glucose) []  - 0 Ankle / Brachial Index (ABI) - do not check if billed separately X- 1 5 Vital Signs Has the patient been seen at the hospital within the last three years: Yes Total Score: 90 Level Of Care: New/Established - Level 3 Electronic Signature(s) Signed: 10/15/2019 4:14:01 PM By: Army Melia Entered By: Army Melia on 10/15/2019 10:54:46 [name], [name] ([id number]) -------------------------------------------------------------------------------- Encounter Discharge Information Details Patient Name: [name], [name] Date of Service: 10/15/2019 10:45 AM Medical Record Number: [id number] Patient Account Number: [id number] Date of Birth/Sex: 05/03/1966 (54 y.o. M) Treating RN: Army Melia Primary Care Kyrstyn Greear: [name] Other Clinician: Referring Phill Steck: [name] Treating Kaeley Vinje/Extender: Melburn Hake, HOYT Weeks in Treatment: 5 Encounter Discharge Information Items Discharge Condition: Stable Ambulatory Status: Walker Discharge Destination: Home Transportation: Private Auto Accompanied By: daughter Schedule Follow-up Appointment: Yes Clinical Summary of Care: Electronic Signature(s) Signed: 10/15/2019 4:14:01 PM By: Army Melia Entered By: Army Melia on 10/15/2019 10:55:33 [name], [name]  ([id number]) -------------------------------------------------------------------------------- Lower Extremity Assessment Details Patient Name: [name], [name] Date of Service: 10/15/2019 10:45 AM Medical Record Number: [id number] Patient Account Number: [id number] Date of Birth/Sex: 1965/10/07 (54 y.o. M) Treating RN: [name] Primary Care Porche Steinberger: [name] Other Clinician: Referring Talita Recht: [name] Treating Myeshia Fojtik/Extender: Sharalyn Ink in Treatment: 5 Electronic Signature(s) Signed: 10/15/2019 5:11:36 PM By: Gretta Cool, BSN, RN, CWS, Kim RN, BSN Entered By: Gretta Cool, BSN, RN, CWS, Kim on 10/15/2019 10:32:14 [place], [name] ([id number]) -------------------------------------------------------------------------------- Multi Wound Chart Details Patient Name: [name], [name] Date of Service: 10/15/2019 10:45 AM Medical Record Number: [id number] Patient Account Number: [id number] Date of Birth/Sex: 1965-07-29 (54 y.o. M) Treating RN: Army Melia Primary Care Janila Arrazola: [name] Other Clinician: Referring Kayci Belleville: [name] Treating Vincentina Sollers/Extender: STONE III, HOYT Weeks in Treatment: 5 Vital Signs Height(in): 72 Pulse(bpm): 100 Weight(lbs): 230 Blood Pressure(mmHg): 123/76 Body Mass Index(BMI): 31 Temperature(F): 98.7 Respiratory Rate(breaths/min): 16 Photos: [N/A:N/A] Wound Location: Sacrum - Midline N/A N/A Wounding Event: Gradually Appeared N/A N/A Primary Etiology: Abscess N/A N/A Date Acquired: 07/24/2019 N/A N/A Weeks of Treatment: 5 N/A N/A Wound Status: Open N/A N/A Measurements L x W x D (cm) 1.4x2.7x2.7 N/A N/A Area (cm) : 2.969 N/A N/A Volume (cm) : 8.016 N/A N/A % Reduction in Area: 64.10% N/A N/A % Reduction in Volume: 78.50% N/A N/A Starting Position 1 (o'clock): 1 Ending Position 1 (o'clock): 1 Maximum Distance 1 (cm): 2.3 Undermining: Yes N/A N/A Classification: Full Thickness With Exposed N/A N/A Support Structures Exudate  Amount: Medium N/A N/A Exudate Type: Serosanguineous N/A N/A Exudate Color: red, brown N/A N/A Foul Odor After Cleansing: Yes N/A N/A Odor Anticipated Due to Product No N/A N/A Use: Wound Margin: Thickened N/A N/A Granulation Amount: Large (67-100%) N/A N/A Granulation Quality: Red N/A N/A Necrotic Amount: Small (1-33%) N/A N/A Exposed Structures: Fat Layer (Subcutaneous Tissue) N/A N/A Exposed: Yes Muscle: Yes Fascia: No Tendon: No  Joint: No Bone: No Epithelialization: None N/A N/A Treatment Notes [name], [name] ([id number]) Electronic Signature(s) Signed: 10/15/2019 4:14:01 PM By: Army Melia Entered By: Army Melia on 10/15/2019 10:47:42 Slight, [name] ([id number]) -------------------------------------------------------------------------------- [place] Details Patient Name: [name], [name] Date of Service: 10/15/2019 10:45 AM Medical Record Number: [id number] Patient Account Number: [id number] Date of Birth/Sex: 05-12-1966 (54 y.o. M) Treating RN: Army Melia Primary Care Heer Justiss: [name] Other Clinician: Referring Albertha Beattie: [name] Treating Leighana Neyman/Extender: Melburn Hake, HOYT Weeks in Treatment: 5 Active Inactive Necrotic Tissue Nursing Diagnoses: Impaired tissue integrity related to necrotic/devitalized tissue Goals: Necrotic/devitalized tissue will be minimized in the wound bed Date Initiated: 09/05/2019 Target Resolution Date: 09/26/2019 Goal Status: Active Interventions: Assess patient pain level pre-, during and post procedure and prior to discharge Treatment Activities: Apply topical anesthetic as ordered : 09/05/2019 Notes: Orientation to the Wound Care Program Nursing Diagnoses: Knowledge deficit related to the wound healing center program Goals: Patient/caregiver will verbalize understanding of the [place] Date Initiated: 09/05/2019 Target Resolution Date: 09/26/2019 Goal Status:  Active Interventions: Provide education on orientation to the wound center Notes: Pressure Nursing Diagnoses: Knowledge deficit related to management of pressures ulcers Goals: Patient will remain free from development of additional pressure ulcers Date Initiated: 09/05/2019 Target Resolution Date: 10/03/2019 Goal Status: Active Patient/caregiver will verbalize risk factors for pressure ulcer development Date Initiated: 09/05/2019 Target Resolution Date: 09/26/2019 Goal Status: Active Patient/caregiver will verbalize understanding of pressure ulcer management Date Initiated: 09/05/2019 Target Resolution Date: 10/03/2019 Goal Status: Active Interventions: Mathers, Jjesus R. ([id number]) Assess: immobility, friction, shearing, incontinence upon admission and as needed Provide education on pressure ulcers Notes: Wound/Skin Impairment Nursing Diagnoses: Impaired tissue integrity Goals: Patient/caregiver will verbalize understanding of skin care regimen Date Initiated: 09/05/2019 Target Resolution Date: 09/26/2019 Goal Status: Active Ulcer/skin breakdown will have a volume reduction of 30% by week 4 Date Initiated: 09/05/2019 Target Resolution Date: 10/03/2019 Goal Status: Active Interventions: Assess ulceration(s) every visit Treatment Activities: Referred to DME Tyler Cubit for dressing supplies : 09/05/2019 Notes: Electronic Signature(s) Signed: 10/15/2019 4:14:01 PM By: Army Melia Entered By: Army Melia on 10/15/2019 10:47:36 Giller, [name] ([id number]) -------------------------------------------------------------------------------- Pain Assessment Details Patient Name: Potvin, [name] Date of Service: 10/15/2019 10:45 AM Medical Record Number: [id number] Patient Account Number: [id number] Date of Birth/Sex: 12-12-1965 (54 y.o. M) Treating RN: [name] Primary Care Joanna Hall: [name] Other Clinician: Referring Juan Olthoff: [name] Treating Shizue Kaseman/Extender: Melburn Hake,  HOYT Weeks in Treatment: 5 Active Problems Location of Pain Severity and Description of Pain Patient Has Paino Yes Site Locations Pain Management and Medication Current Pain Management: Notes Patient states not enough to worry about. Electronic Signature(s) Signed: 10/15/2019 5:11:36 PM By: Gretta Cool, BSN, RN, CWS, Kim RN, BSN Entered By: Gretta Cool, BSN, RN, CWS, Kim on 10/15/2019 10:31:37 Krasowski, [name] ([id number]) -------------------------------------------------------------------------------- Patient/Caregiver Education Details Patient Name: Bezanson, TAKOMA SCHUELKE. Date of Service: 10/15/2019 10:45 AM Medical Record Number: [id number] Patient Account Number: [id number] Date of Birth/Gender: Feb 07, 1966 (54 y.o. M) Treating RN: Army Melia Primary Care Physician: [name] Other Clinician: Referring Physician: [name] Treating Physician/Extender: Sharalyn Ink in Treatment: 5 Education Assessment Education Provided To: Patient Education Topics Provided Wound/Skin Impairment: Handouts: Caring for Your Ulcer Methods: Demonstration, Explain/Verbal Responses: State content correctly Electronic Signature(s) Signed: 10/15/2019 4:14:01 PM By: Army Melia Entered By: Army Melia on 10/15/2019 10:54:59 Heiney, [name] ([id number]) -------------------------------------------------------------------------------- Wound Assessment Details Patient Name: Kuznia, [name] Date of Service: 10/15/2019 10:45 AM Medical Record Number: [id number]  Patient Account Number: [id number] Date of Birth/Sex: 01/08/1966 (54 y.o. M) Treating RN: [name] Primary Care Palmira Stickle: [name] Other Clinician: Referring Aylan Bayona: [name] Treating Breylon Sherrow/Extender: STONE III, HOYT Weeks in Treatment: 5 Wound Status Wound Number: 1 Primary Etiology: Abscess Wound Location: Sacrum - Midline Wound Status: Open Wounding Event: Gradually Appeared Date Acquired: 07/24/2019 Weeks Of Treatment:  5 Clustered Wound: No Photos Wound Measurements Length: (cm) 1.4 Width: (cm) 2.7 Depth: (cm) 2.7 Area: (cm) 2.969 Volume: (cm) 8.016 % Reduction in Area: 64.1% % Reduction in Volume: 78.5% Epithelialization: None Undermining: Yes Starting Position (o'clock): 1 Ending Position (o'clock): 1 Maximum Distance: (cm) 2.3 Wound Description Classification: Full Thickness With Exposed Support Structures Wound Margin: Thickened Exudate Amount: Medium Exudate Type: Serosanguineous Exudate Color: red, brown Foul Odor After Cleansing: Yes Due to Product Use: No Slough/Fibrino No Wound Bed Granulation Amount: Large (67-100%) Exposed Structure Granulation Quality: Red Fascia Exposed: No Necrotic Amount: Small (1-33%) Fat Layer (Subcutaneous Tissue) Exposed: Yes Necrotic Quality: Adherent Slough Tendon Exposed: No Muscle Exposed: Yes Necrosis of Muscle: No Joint Exposed: No Bone Exposed: No Treatment Notes Wound #1 (Midline Sacrum) Notes Brundage, Tadashi R. ([id number]) Wet to dry with hydrogel; bordered foam dressing. NPWT to be applied by home health. Electronic Signature(s) Signed: 10/15/2019 5:11:36 PM By: Gretta Cool, BSN, RN, CWS, Kim RN, BSN Entered By: Gretta Cool, BSN, RN, CWS, Kim on 10/15/2019 10:39:17 [place]well, [name] ([id number]) -------------------------------------------------------------------------------- [place] Details Patient Name: Dilday, [name] Date of Service: 10/15/2019 10:45 AM Medical Record Number: [id number] Patient Account Number: [id number] Date of Birth/Sex: 07/13/1966 (54 y.o. M) Treating RN: [name] Primary Care Charnelle Bergeman: [name] Other Clinician: Referring Irvin Bastin: [name] Treating Khristie Sak/Extender: Melburn Hake, HOYT Weeks in Treatment: 5 Vital Signs Time Taken: 10:30 Temperature (F): 98.7 Height (in): 72 Pulse (bpm): 100 Weight (lbs): 230 Respiratory Rate (breaths/min): 16 Body Mass Index (BMI): 31.2 Blood Pressure (mmHg):  123/76 Reference Range: 80 - 120 mg / dl Electronic Signature(s) Signed: 10/15/2019 5:11:36 PM By: Gretta Cool, BSN, RN, CWS, Kim RN, BSN Entered By: Gretta Cool, BSN, RN, CWS, Kim on 10/15/2019 10:31:14

## 2019-10-15 NOTE — Progress Notes (Addendum)
Folger, [name] ([id number]) Visit Report for 10/15/2019 Chief Complaint Document Details Patient Name: [name], [name] Date of Service: 10/15/2019 10:45 AM Medical Record Number: [id number] Patient Account Number: [id number] Date of Birth/Sex: Jul 15, 1966 (54 y.o. M) Treating RN: Army Melia Primary Care Provider: [name] Other Clinician: Referring Provider: [name] Treating Provider/Extender: Melburn Hake, Jafari Mckillop Weeks in Treatment: 5 Information Obtained from: Patient Chief Complaint 09/05/2019; patient is here for review of his substantial wound over the lower sacrum and right buttock Electronic Signature(s) Signed: 10/15/2019 10:56:14 AM By: Worthy Keeler PA-C Entered By: Worthy Keeler on 10/15/2019 10:56:14 Stanfield, Candelaria [name] ([id number]) -------------------------------------------------------------------------------- HPI Details Patient Name: [name] Date of Service: 10/15/2019 10:45 AM Medical Record Number: [id number] Patient Account Number: [id number] Date of Birth/Sex: Jun 14, 1966 (54 y.o. M) Treating RN: Army Melia Primary Care Provider: [name] Other Clinician: Referring Provider: [name] Treating Provider/Extender: Melburn Hake, Denetta Fei Weeks in Treatment: 5 History of Present Illness HPI Description: ADMISSION 09/05/2019 This is a 54 year old man who has a complicated history which started with acute low back pain sometime in September. Saw his primary doctor had a CT scan of the abdomen and pelvis that was normal. By late December he was discovered to have a lumbar epidural abscess with cauda equina syndrome. He was admitted to hospital and had a decompressive laminectomy at L3/L4/L5 blood cultures at the time showed MSSA he received IV nafcillin. He went to rehab from 1/5 through 1/15 there he was noted to have an "pressure injury in the skin" although I did not see much more about this in the discharge instructions. Unfortunately this wound became  infected after he was discharged home. He required readmission from 1/25 through 2/2 with sepsis, discovered to have a right psoas abscess as well as an infected decubitus ulcer. He had a bedside debridement by general surgery on 1/25 interventional radiology drain the right psoas abscess. General surgery recommended 3 times daily wet-to-dry dressings and an air mattress. He has since been discharged home. He is mobile with a walker. They are using 3 times daily wet-to-dry dressings. According to his family he is eating well. He has a protein supplement but he he is not taking it. He is currently on ampicillin sulbactam as recommended by [name] this is due to be finished currently on 2/22 although there seem to be some suggestion it might go longer. They say he had blood work 2 days ago which I will review. The last lab work I saw was a sedimentation rate of 81 and a C-reactive protein of 212 on 1/25. I did not see any imaging studies of the underlying bone although I will need to review the CT scans that he has had Past medical history, polymyalgia rheumatica, left total hip replacement in 2018 for avascular necrosis, cellulitis of the scrotum in July 2020, history of alcohol abuse but that is not currently problematic 09/12/2019; patient readmitted to the clinic last week. I put him on a wet-to-dry dressing with silver sorb gel to the larger wound area on his coccyx and proximal right buttocks. These are actually connected. In general the wound surfaces look quite good although they are deep and there is a large amount of undermining. There is no exposed bone. Last lab work on 2/8 that was ordered by [name] showed a CRP of 68.5 and a sedimentation rate of greater than 130. The CRP was elevated versus 18.3 on 1/10 although it is difficult to interpret these necessarily in  somebody with polymyalgia rheumatica. His white count was 15.7 hemoglobin 10.7 The patient is offloading this is much  as he can says that he is eating and drinking well which seems verified by his family member. 2/24; patient with a large wound on the lower sacrum and proximal right buttocks. There is a bridge of overlying skin but the wounds are connected and there is wide undermining. We are using wet-to-dry dressings. I have reviewed [name] notes of 09/17/2019 he is remove the PICC line. He had a prolonged course of IV antibiotics for the MSSA bacteremia, lumbar infection, psoas abscess and a necrotic sacral wound. Interestingly I noticed the same history of PMR in [place] link that [name] comments on. I went over the case again with the patient and his daughter he is totally unfamiliar with anything to do with polymyalgia rheumatica or its obvious symptoms that I also described. I told him that this is not a diagnosis that most doctors would make and put in an E HR without documentation but he is just not familiar with it and neither his his daughter. For now we are using wet- to-dry dressings. I think it is time to try a wound VAC now that we are certain about underlying infection issues 3/3; he arrives today with his wound actually looks somewhat better. Two small areas with a bridge of skin actually have contracted I believe there is less depth. There is still extensive undermining. He got his VAC machine yesterday but they have still not put it on. 3/10; the orifice of the actual wound is contracting faster than the undermining dimensions of the wound. This is probably soon going to make this difficult to continue with the [facility] 10/15/2019 upon evaluation today patient appears to be doing well in general with regard to his wound. Apparently this looks good although I have not really seen him he has been seen by [name] up to this point. Nonetheless he is currently on a wound VAC and apparently has been having some odor over the past several days which is why comes in for evaluation earlier  today. I am going obtain a wound culture and then subsequently we will likely place the patient on an antibiotic at this point to try to help out with the odor which I presume is coming from a bacterial infection. The patient has been off of all antibiotics for the past month having ended on 22 February. With that being said he is not having any increased pain or anything else going on at this point which is good news. He is not allergic to any antibiotics that he is aware of. Electronic Signature(s) Signed: 10/15/2019 10:57:40 AM By: Worthy Keeler PA-C Entered By: Worthy Keeler on 10/15/2019 10:57:39 Kuchera, Candelaria [name] ([id number]) -------------------------------------------------------------------------------- Physical Exam Details Patient Name: Telford, Lamone R. Date of Service: 10/15/2019 10:45 AM Medical Record Number: [id number] Patient Account Number: [id number] Date of Birth/Sex: [birth year] (54 y.o. M) Treating RN: Army Melia Primary Care Provider: [name] Other Clinician: Referring Provider: [name] Treating Provider/Extender: STONE III, Duan Scharnhorst Weeks in Treatment: 5 Constitutional Well-nourished and well-hydrated in no acute distress. Respiratory normal breathing without difficulty. Psychiatric this patient is able to make decisions and demonstrates good insight into disease process. Alert and Oriented x 3. pleasant and cooperative. Notes Patient's wound bed currently showed signs of good granulation at this time. Fortunately there is no evidence of active infection which is also good news. No fevers, chills, nausea, vomiting,  or diarrhea. I do believe the wound VAC can stay in place especially in light of the fact that we can be treatment with an antibiotic at this point. The patient is in agreement with that plan. Electronic Signature(s) Signed: 10/15/2019 10:58:04 AM By: Worthy Keeler PA-C Entered By: Worthy Keeler on 10/15/2019 10:58:04 Counterman, Candelaria [name]  ([id number]) -------------------------------------------------------------------------------- Physician Orders Details Patient Name: Oboyle, Candelaria [name]. Date of Service: 10/15/2019 10:45 AM Medical Record Number: [id number] Patient Account Number: [id number] Date of Birth/Sex: 04/28/1966 (54 y.o. M) Treating RN: Army Melia Primary Care Provider: [name] Other Clinician: Referring Provider: [name] Treating Provider/Extender: Melburn Hake, Caylen Kuwahara Weeks in Treatment: 5 Verbal / Phone Orders: No Diagnosis Coding ICD-10 Coding Code Description L89.154 Pressure ulcer of sacral region, stage 4 T81.31XD Disruption of external operation (surgical) wound, not elsewhere classified, subsequent encounter L02.31 Cutaneous abscess of buttock Wound Cleansing Wound #1 Midline Sacrum o Clean wound with Normal Saline. Anesthetic (add to Medication List) Wound #1 Midline Sacrum o Topical Lidocaine 4% cream applied to wound bed prior to debridement (In Clinic Only). Skin Barriers/Peri-Wound Care Wound #1 Midline Sacrum o Skin Prep Primary Wound Dressing Wound #1 Midline Sacrum o Dry Gauze - Moistened with SIlversorb Gel (hydrogel in clinic) Secondary Dressing Wound #1 Midline Sacrum o Boardered Foam Dressing Dressing Change Frequency Wound #1 Midline Sacrum o Change Dressing Monday, Wednesday, Friday - Homehealth Follow-up Appointments Wound #1 Midline Sacrum o Return Appointment in 1 week. Off-Loading Wound #1 Midline Sacrum o Other: - NO Pressure on the wounded areas. Additional Orders / Instructions Wound #1 Midline Sacrum o Increase protein intake. - Powder protein supplement, multivitamin o Activity as tolerated Home Health Wound #1 Midline [place] Visits - [place] Nurse may visit PRN to address patientos wound care needs. Aycock, FATE MCCRARY ([id number]) o FACE TO FACE ENCOUNTER: MEDICARE and MEDICAID PATIENTS: I certify  that this patient is under my care and that I had a face-to- face encounter that meets the physician face-to-face encounter requirements with this patient on this date. The encounter with the patient was in whole or in part for the following MEDICAL CONDITION: (primary reason for [place]) MEDICAL NECESSITY: I certify, that based on my findings, NURSING services are a medically necessary home health service. HOME BOUND STATUS: I certify that my clinical findings support that this patient is homebound (i.e., Due to illness or injury, pt requires aid of supportive devices such as crutches, cane, wheelchairs, walkers, the use of special transportation or the assistance of another person to leave their place of residence. There is a normal inability to leave the home and doing so requires considerable and taxing effort. Other absences are for medical reasons / religious services and are infrequent or of short duration when for other reasons). o If current dressing causes regression in wound condition, may D/C ordered dressing product/s and apply Normal Saline Moist Dressing daily until next [place] / Other MD appointment. [place] of regression in wound condition at [phone]. o Please direct any NON-WOUND related issues/requests for orders to patient's Primary Care Physician Negative Pressure Wound Therapy Wound #1 Midline Sacrum o Wound VAC settings at 125/130 mmHg continuous pressure. Use BLACK/GREEN foam to wound cavity. Use WHITE foam to fill any tunnel/s and/or undermining. Change VAC dressing 3 X WEEK. Change canister as indicated when full. Nurse may titrate settings and frequency of dressing changes as clinically indicated. - Home health to apply. Bridge  to alternate hips at changes. Do not place where patient is sitting on suction. o Home Health Nurse may d/c VAC for s/s of increased infection, significant wound regression, or uncontrolled  drainage. Will at [phone]. Laboratory o Bacteria identified in Wound by Culture (MICRO) - Sacrum oooo LOINC Code: [id number] Convenience Name: Wound culture routine Patient Medications Allergies: No Known Allergies Notifications Medication Indication Start End Levaquin 10/15/2019 DOSE 1 - oral 750 mg tablet - 1 tablet oral taken 1 time per day for 7 days Levaquin 10/22/2019 DOSE 1 - oral 750 mg tablet - 1 tablet oral taken 1 time per day for 7 days to extend the prescription from 10/15/19 Electronic Signature(s) Signed: 10/22/2019 1:55:08 PM By: Worthy Keeler PA-C Previous Signature: 10/15/2019 11:05:15 AM Version By: Worthy Keeler PA-C Entered By: Worthy Keeler on 10/22/2019 13:55:07 Koplin, Candelaria [name] ([id number]) -------------------------------------------------------------------------------- Problem List Details Patient Name: Densmore, [name] Date of Service: 10/15/2019 10:45 AM Medical Record Number: [id number] Patient Account Number: [id number] Date of Birth/Sex: 03/08/1966 (54 y.o. M) Treating RN: Army Melia Primary Care Provider: [name] Other Clinician: Referring Provider: [name] Treating Provider/Extender: Melburn Hake, Kiira Brach Weeks in Treatment: 5 Active Problems ICD-10 Evaluated Encounter Code Description Active Date Today Diagnosis L89.154 Pressure ulcer of sacral region, stage 4 09/05/2019 No Yes T81.31XD Disruption of external operation (surgical) wound, not elsewhere classified, 09/05/2019 No Yes subsequent encounter L02.31 Cutaneous abscess of buttock 09/05/2019 No Yes Inactive Problems Resolved Problems Electronic Signature(s) Signed: 10/15/2019 10:32:28 AM By: Worthy Keeler PA-C Entered By: Worthy Keeler on 10/15/2019 10:32:27 Gilbert, Candelaria [name] ([id number]) -------------------------------------------------------------------------------- Progress Note Details Patient Name: Vilchis, Joseeduardo R. Date of Service: 10/15/2019 10:45  AM Medical Record Number: [id number] Patient Account Number: [id number] Date of Birth/Sex: 11/24/1965 (54 y.o. M) Treating RN: Army Melia Primary Care Provider: [name] Other Clinician: Referring Provider: [name] Treating Provider/Extender: Melburn Hake, Kivon Aprea Weeks in Treatment: 5 Subjective Chief Complaint Information obtained from Patient 09/05/2019; patient is here for review of his substantial wound over the lower sacrum and right buttock History of Present Illness (HPI) ADMISSION 09/05/2019 This is a 54 year old man who has a complicated history which started with acute low back pain sometime in September. Saw his primary doctor had a CT scan of the abdomen and pelvis that was normal. By late December he was discovered to have a lumbar epidural abscess with cauda equina syndrome. He was admitted to hospital and had a decompressive laminectomy at L3/L4/L5 blood cultures at the time showed MSSA he received IV nafcillin. He went to rehab from 1/5 through 1/15 there he was noted to have an "pressure injury in the skin" although I did not see much more about this in the discharge instructions. Unfortunately this wound became infected after he was discharged home. He required readmission from 1/25 through 2/2 with sepsis, discovered to have a right psoas abscess as well as an infected decubitus ulcer. He had a bedside debridement by general surgery on 1/25 interventional radiology drain the right psoas abscess. General surgery recommended 3 times daily wet-to-dry dressings and an air mattress. He has since been discharged home. He is mobile with a walker. They are using 3 times daily wet-to-dry dressings. According to his family he is eating well. He has a protein supplement but he he is not taking it. He is currently on ampicillin sulbactam as recommended by [name] this is due to be finished currently on 2/22 although there seem to be  some suggestion it might go longer. They say  he had blood work 2 days ago which I will review. The last lab work I saw was a sedimentation rate of 81 and a C-reactive protein of 212 on 1/25. I did not see any imaging studies of the underlying bone although I will need to review the CT scans that he has had Past medical history, polymyalgia rheumatica, left total hip replacement in 2018 for avascular necrosis, cellulitis of the scrotum in July 2020, history of alcohol abuse but that is not currently problematic 09/12/2019; patient readmitted to the clinic last week. I put him on a wet-to-dry dressing with silver sorb gel to the larger wound area on his coccyx and proximal right buttocks. These are actually connected. In general the wound surfaces look quite good although they are deep and there is a large amount of undermining. There is no exposed bone. Last lab work on 2/8 that was ordered by [name] showed a CRP of 68.5 and a sedimentation rate of greater than 130. The CRP was elevated versus 18.3 on 1/10 although it is difficult to interpret these necessarily in somebody with polymyalgia rheumatica. His white count was 15.7 hemoglobin 10.7 The patient is offloading this is much as he can says that he is eating and drinking well which seems verified by his family member. 2/24; patient with a large wound on the lower sacrum and proximal right buttocks. There is a bridge of overlying skin but the wounds are connected and there is wide undermining. We are using wet-to-dry dressings. I have reviewed [name] notes of 09/17/2019 he is remove the PICC line. He had a prolonged course of IV antibiotics for the MSSA bacteremia, lumbar infection, psoas abscess and a necrotic sacral wound. Interestingly I noticed the same history of PMR in [place] link that [name] comments on. I went over the case again with the patient and his daughter he is totally unfamiliar with anything to do with polymyalgia rheumatica or its obvious symptoms that  I also described. I told him that this is not a diagnosis that most doctors would make and put in an E HR without documentation but he is just not familiar with it and neither his his daughter. For now we are using wet- to-dry dressings. I think it is time to try a wound VAC now that we are certain about underlying infection issues 3/3; he arrives today with his wound actually looks somewhat better. Two small areas with a bridge of skin actually have contracted I believe there is less depth. There is still extensive undermining. He got his VAC machine yesterday but they have still not put it on. 3/10; the orifice of the actual wound is contracting faster than the undermining dimensions of the wound. This is probably soon going to make this difficult to continue with the [facility] 10/15/2019 upon evaluation today patient appears to be doing well in general with regard to his wound. Apparently this looks good although I have not really seen him he has been seen by [name] up to this point. Nonetheless he is currently on a wound VAC and apparently has been having some odor over the past several days which is why comes in for evaluation earlier today. I am going obtain a wound culture and then subsequently we will likely place the patient on an antibiotic at this point to try to help out with the odor which I presume is coming from a bacterial infection. The  patient has been off of all antibiotics for the past month having ended on 22 February. With that being said he is not having any increased pain or anything else going on at this point which is good news. He is not allergic to any antibiotics that he is aware of. Shutt, NYSAIAH CARRAHER ([id number]) Objective Constitutional Well-nourished and well-hydrated in no acute distress. Vitals Time Taken: 10:30 AM, Height: 72 in, Weight: 230 lbs, BMI: 31.2, Temperature: 98.7 F, Pulse: 100 bpm, Respiratory Rate: 16 breaths/min, Blood Pressure: 123/76  mmHg. Respiratory normal breathing without difficulty. Psychiatric this patient is able to make decisions and demonstrates good insight into disease process. Alert and Oriented x 3. pleasant and cooperative. General Notes: Patient's wound bed currently showed signs of good granulation at this time. Fortunately there is no evidence of active infection which is also good news. No fevers, chills, nausea, vomiting, or diarrhea. I do believe the wound VAC can stay in place especially in light of the fact that we can be treatment with an antibiotic at this point. The patient is in agreement with that plan. Integumentary (Hair, Skin) Wound #1 status is Open. Original cause of wound was Gradually Appeared. The wound is located on the Midline Sacrum. The wound measures 1.4cm length x 2.7cm width x 2.7cm depth; 2.969cm^2 area and 8.016cm^3 volume. There is muscle and Fat Layer (Subcutaneous Tissue) Exposed exposed. There is undermining starting at 1:00 and ending at 1:00 with a maximum distance of 2.3cm. There is a medium amount of serosanguineous drainage noted. Foul odor after cleansing was noted. The wound margin is thickened. There is large (67-100%) red granulation within the wound bed. There is a small (1-33%) amount of necrotic tissue within the wound bed including Adherent Slough. Assessment Active Problems ICD-10 Pressure ulcer of sacral region, stage 4 Disruption of external operation (surgical) wound, not elsewhere classified, subsequent encounter Cutaneous abscess of buttock Plan Wound Cleansing: Wound #1 Midline Sacrum: Clean wound with Normal Saline. Anesthetic (add to Medication List): Wound #1 Midline Sacrum: Topical Lidocaine 4% cream applied to wound bed prior to debridement (In Clinic Only). Skin Barriers/Peri-Wound Care: Wound #1 Midline Sacrum: Skin Prep Primary Wound Dressing: Wound #1 Midline Sacrum: Dry Gauze - Moistened with SIlversorb Gel (hydrogel in  clinic) Secondary Dressing: Wound #1 Midline Sacrum: Boardered Foam Dressing Dressing Change Frequency: Wound #1 Midline Sacrum: Change Dressing Monday, Wednesday, Friday - Homehealth Follow-up Appointments: Wound #1 Midline Sacrum: Return Appointment in 1 week. Off-Loading: Smolinski, Geovonni R. ([id number]) Wound #1 Midline Sacrum: Other: - NO Pressure on the wounded areas. Additional Orders / Instructions: Wound #1 Midline Sacrum: Increase protein intake. - Powder protein supplement, multivitamin Activity as tolerated Home Health: Wound #1 Midline Sacrum: Continue Home Health Visits - [facility] Nurse may visit PRN to address patient s wound care needs. FACE TO FACE ENCOUNTER: MEDICARE and MEDICAID PATIENTS: I certify that this patient is under my care and that I had a face-to-face encounter that meets the physician face-to-face encounter requirements with this patient on this date. The encounter with the patient was in whole or in part for the following MEDICAL CONDITION: (primary reason for [place]) MEDICAL NECESSITY: I certify, that based on my findings, NURSING services are a medically necessary home health service. HOME BOUND STATUS: I certify that my clinical findings support that this patient is homebound (i.e., Due to illness or injury, pt requires aid of supportive devices such as crutches, cane, wheelchairs, walkers, the use of special transportation or the assistance  of another person to leave their place of residence. There is a normal inability to leave the home and doing so requires considerable and taxing effort. Other absences are for medical reasons / religious services and are infrequent or of short duration when for other reasons). If current dressing causes regression in wound condition, may D/C ordered dressing product/s and apply Normal Saline Moist Dressing daily until next [place] / Other MD appointment. [place] of  regression in wound condition at [phone]. Please direct any NON-WOUND related issues/requests for orders to patient's Primary Care Physician Negative Pressure Wound Therapy: Wound #1 Midline Sacrum: Wound VAC settings at 125/130 mmHg continuous pressure. Use BLACK/GREEN foam to wound cavity. Use WHITE foam to fill any tunnel/s and/or undermining. Change VAC dressing 3 X WEEK. Change canister as indicated when full. Nurse may titrate settings and frequency of dressing changes as clinically indicated. - Home health to apply. Bridge to alternate hips at changes. Do not place where patient is sitting on suction. Home Health Nurse may d/c VAC for s/s of increased infection, significant wound regression, or uncontrolled drainage. [place] at [phone]. Laboratory ordered were: Wound culture routine - Sacrum The following medication(s) was prescribed: Levaquin oral 750 mg tablet 1 1 tablet oral taken 1 time per day for 7 days starting 10/15/2019 Levaquin oral 750 mg tablet 1 1 tablet oral taken 1 time per day for 7 days to extend the prescription from 10/15/19 starting 10/22/2019 1. I would recommend that we go ahead and initiate treatment with Levaquin for the next 7 days. Hopefully this will help treat both gram-positive and gram-negative possibilities for infection since we are not really sure exactly what is going on this point especially with this having a strong odor I wanted to cover for the possibility of Pseudomonas and other gram negatives. 2. I am also going to recommend that I do believe the wound VAC can be continued. I think that the antibiotic should hopefully start to help manage this more effectively as far as the odor is concerned and subsequently will give [place] a better chance of seeing improvement by next week. Both an odor as well as overall quality of the wound bed which is good news. 3. I recommend that he continue to provide appropriate offloading as far as  the wound is concerned in order to prevent this from worsening in the meantime. We will see patient back for reevaluation in 1 week here in the clinic. If anything worsens or changes patient will contact our office for additional recommendations. Addendum 10/22/2019: I reviewed the patient's culture from 10/16/2019. Subsequently there was a positive finding for E. coli as well as Enterobacter. Fortunately both are sensitive to Cipro and therefore the Levaquin should be beneficial for the patient. I would recommend extending that for 1 more week for a total of 2 weeks of treatment. The patient is in agreement with that plan as previously discussed. That was sent in for him today. Electronic Signature(s) Signed: 10/22/2019 1:56:12 PM By: Worthy Keeler PA-C Previous Signature: 10/15/2019 11:05:42 AM Version By: Worthy Keeler PA-C Entered By: Worthy Keeler on 10/22/2019 13:56:11 Monnier, Candelaria [name] ([id number]) -------------------------------------------------------------------------------- SuperBill Details Patient Name: Coccia, Candelaria [name]. Date of Service: 10/15/2019 Medical Record Number: [id number] Patient Account Number: [id number] Date of Birth/Sex: 05-23-1966 (54 y.o. M) Treating RN: Army Melia Primary Care Provider: [name] Other Clinician: Referring Provider: [name] Treating Provider/Extender: STONE III, Jaysean Manville Weeks in Treatment: 5 Diagnosis  Coding ICD-10 Codes Code Description L89.154 Pressure ulcer of sacral region, stage 4 T81.31XD Disruption of external operation (surgical) wound, not elsewhere classified, subsequent encounter L02.31 Cutaneous abscess of buttock Facility Procedures CPT4 Code: [id number] Description: 99213 - WOUND CARE VISIT-LEV 3 EST PT Modifier: Quantity: 1 Physician Procedures CPT4 Code Description: [id number] - WC PHYS LEVEL 4 - EST PT Modifier: Quantity: 1 CPT4 Code Description: ICD-10 Diagnosis Description L89.154 Pressure ulcer of sacral  region, stage 4 T81.31XD Disruption of external operation (surgical) wound, not elsewhere classified L02.31 Cutaneous abscess of buttock Modifier: , subsequent encount Quantity: er Engineer, maintenance) Signed: 10/15/2019 11:13:19 AM By: Worthy Keeler PA-C Entered By: Worthy Keeler on 10/15/2019 11:13:19

## 2019-10-16 ENCOUNTER — Other Ambulatory Visit
Admission: RE | Admit: 2019-10-16 | Discharge: 2019-10-16 | Disposition: A | Discharge status: Home / Self Care | Payer: BC Managed Care – PPO | Source: Ambulatory Visit | Attending: Physician Assistant | Admitting: Physician Assistant

## 2019-10-16 DIAGNOSIS — L89154 Pressure ulcer of sacral region, stage 4: Secondary | ICD-10-CM | POA: Diagnosis not present

## 2019-10-16 DIAGNOSIS — L03818 Cellulitis of other sites: Secondary | ICD-10-CM | POA: Insufficient documentation

## 2019-10-17 ENCOUNTER — Ambulatory Visit: Payer: BC Managed Care – PPO | Admitting: Internal Medicine

## 2019-10-17 DIAGNOSIS — D72829 Elevated white blood cell count, unspecified: Secondary | ICD-10-CM | POA: Diagnosis not present

## 2019-10-17 DIAGNOSIS — M4804 Spinal stenosis, thoracic region: Secondary | ICD-10-CM | POA: Diagnosis not present

## 2019-10-17 DIAGNOSIS — M4316 Spondylolisthesis, lumbar region: Secondary | ICD-10-CM | POA: Diagnosis not present

## 2019-10-17 DIAGNOSIS — M5144 Schmorl's nodes, thoracic region: Secondary | ICD-10-CM | POA: Diagnosis not present

## 2019-10-17 DIAGNOSIS — L89894 Pressure ulcer of other site, stage 4: Secondary | ICD-10-CM | POA: Diagnosis not present

## 2019-10-17 DIAGNOSIS — M4604 Spinal enthesopathy, thoracic region: Secondary | ICD-10-CM | POA: Diagnosis not present

## 2019-10-17 DIAGNOSIS — I081 Rheumatic disorders of both mitral and tricuspid valves: Secondary | ICD-10-CM | POA: Diagnosis not present

## 2019-10-17 DIAGNOSIS — G4700 Insomnia, unspecified: Secondary | ICD-10-CM | POA: Diagnosis not present

## 2019-10-17 DIAGNOSIS — M47816 Spondylosis without myelopathy or radiculopathy, lumbar region: Secondary | ICD-10-CM | POA: Diagnosis not present

## 2019-10-17 DIAGNOSIS — M4646 Discitis, unspecified, lumbar region: Secondary | ICD-10-CM | POA: Diagnosis not present

## 2019-10-17 DIAGNOSIS — M48061 Spinal stenosis, lumbar region without neurogenic claudication: Secondary | ICD-10-CM | POA: Diagnosis not present

## 2019-10-17 DIAGNOSIS — G834 Cauda equina syndrome: Secondary | ICD-10-CM | POA: Diagnosis not present

## 2019-10-17 DIAGNOSIS — L89312 Pressure ulcer of right buttock, stage 2: Secondary | ICD-10-CM | POA: Diagnosis not present

## 2019-10-17 DIAGNOSIS — M40204 Unspecified kyphosis, thoracic region: Secondary | ICD-10-CM | POA: Diagnosis not present

## 2019-10-17 DIAGNOSIS — N319 Neuromuscular dysfunction of bladder, unspecified: Secondary | ICD-10-CM | POA: Diagnosis not present

## 2019-10-17 DIAGNOSIS — J9811 Atelectasis: Secondary | ICD-10-CM | POA: Diagnosis not present

## 2019-10-17 DIAGNOSIS — L89154 Pressure ulcer of sacral region, stage 4: Secondary | ICD-10-CM | POA: Diagnosis not present

## 2019-10-18 DIAGNOSIS — L89154 Pressure ulcer of sacral region, stage 4: Secondary | ICD-10-CM | POA: Diagnosis not present

## 2019-10-19 DIAGNOSIS — K5900 Constipation, unspecified: Secondary | ICD-10-CM

## 2019-10-19 DIAGNOSIS — G834 Cauda equina syndrome: Secondary | ICD-10-CM | POA: Diagnosis not present

## 2019-10-19 DIAGNOSIS — G4700 Insomnia, unspecified: Secondary | ICD-10-CM | POA: Diagnosis not present

## 2019-10-19 DIAGNOSIS — L89154 Pressure ulcer of sacral region, stage 4: Secondary | ICD-10-CM | POA: Diagnosis not present

## 2019-10-19 DIAGNOSIS — M62838 Other muscle spasm: Secondary | ICD-10-CM

## 2019-10-19 DIAGNOSIS — M5144 Schmorl's nodes, thoracic region: Secondary | ICD-10-CM | POA: Diagnosis not present

## 2019-10-19 DIAGNOSIS — D72829 Elevated white blood cell count, unspecified: Secondary | ICD-10-CM | POA: Diagnosis not present

## 2019-10-19 DIAGNOSIS — M4646 Discitis, unspecified, lumbar region: Secondary | ICD-10-CM | POA: Diagnosis not present

## 2019-10-19 DIAGNOSIS — M47816 Spondylosis without myelopathy or radiculopathy, lumbar region: Secondary | ICD-10-CM | POA: Diagnosis not present

## 2019-10-19 DIAGNOSIS — M4804 Spinal stenosis, thoracic region: Secondary | ICD-10-CM | POA: Diagnosis not present

## 2019-10-19 DIAGNOSIS — M21372 Foot drop, left foot: Secondary | ICD-10-CM

## 2019-10-19 DIAGNOSIS — M4604 Spinal enthesopathy, thoracic region: Secondary | ICD-10-CM | POA: Diagnosis not present

## 2019-10-19 DIAGNOSIS — M4316 Spondylolisthesis, lumbar region: Secondary | ICD-10-CM | POA: Diagnosis not present

## 2019-10-19 DIAGNOSIS — J9811 Atelectasis: Secondary | ICD-10-CM | POA: Diagnosis not present

## 2019-10-19 DIAGNOSIS — N319 Neuromuscular dysfunction of bladder, unspecified: Secondary | ICD-10-CM | POA: Diagnosis not present

## 2019-10-19 DIAGNOSIS — M40204 Unspecified kyphosis, thoracic region: Secondary | ICD-10-CM | POA: Diagnosis not present

## 2019-10-19 DIAGNOSIS — Q6101 Congenital single renal cyst: Secondary | ICD-10-CM

## 2019-10-19 DIAGNOSIS — L89894 Pressure ulcer of other site, stage 4: Secondary | ICD-10-CM | POA: Diagnosis not present

## 2019-10-19 DIAGNOSIS — M4607 Spinal enthesopathy, lumbosacral region: Secondary | ICD-10-CM

## 2019-10-19 DIAGNOSIS — L89312 Pressure ulcer of right buttock, stage 2: Secondary | ICD-10-CM | POA: Diagnosis not present

## 2019-10-19 DIAGNOSIS — I081 Rheumatic disorders of both mitral and tricuspid valves: Secondary | ICD-10-CM | POA: Diagnosis not present

## 2019-10-19 DIAGNOSIS — Z20828 Contact with and (suspected) exposure to other viral communicable diseases: Secondary | ICD-10-CM | POA: Diagnosis not present

## 2019-10-19 DIAGNOSIS — M48061 Spinal stenosis, lumbar region without neurogenic claudication: Secondary | ICD-10-CM | POA: Diagnosis not present

## 2019-10-19 LAB — AEROBIC CULTURE W GRAM STAIN (SUPERFICIAL SPECIMEN)

## 2019-10-20 DIAGNOSIS — L89154 Pressure ulcer of sacral region, stage 4: Secondary | ICD-10-CM | POA: Diagnosis not present

## 2019-10-21 ENCOUNTER — Ambulatory Visit: Payer: BC Managed Care – PPO

## 2019-10-21 DIAGNOSIS — L89154 Pressure ulcer of sacral region, stage 4: Secondary | ICD-10-CM | POA: Diagnosis not present

## 2019-10-22 DIAGNOSIS — L89154 Pressure ulcer of sacral region, stage 4: Secondary | ICD-10-CM | POA: Diagnosis not present

## 2019-10-22 DIAGNOSIS — N319 Neuromuscular dysfunction of bladder, unspecified: Secondary | ICD-10-CM | POA: Diagnosis not present

## 2019-10-22 DIAGNOSIS — M5144 Schmorl's nodes, thoracic region: Secondary | ICD-10-CM | POA: Diagnosis not present

## 2019-10-22 DIAGNOSIS — M48061 Spinal stenosis, lumbar region without neurogenic claudication: Secondary | ICD-10-CM | POA: Diagnosis not present

## 2019-10-22 DIAGNOSIS — D72829 Elevated white blood cell count, unspecified: Secondary | ICD-10-CM | POA: Diagnosis not present

## 2019-10-22 DIAGNOSIS — L89312 Pressure ulcer of right buttock, stage 2: Secondary | ICD-10-CM | POA: Diagnosis not present

## 2019-10-22 DIAGNOSIS — I081 Rheumatic disorders of both mitral and tricuspid valves: Secondary | ICD-10-CM | POA: Diagnosis not present

## 2019-10-22 DIAGNOSIS — M4804 Spinal stenosis, thoracic region: Secondary | ICD-10-CM | POA: Diagnosis not present

## 2019-10-22 DIAGNOSIS — M47816 Spondylosis without myelopathy or radiculopathy, lumbar region: Secondary | ICD-10-CM | POA: Diagnosis not present

## 2019-10-22 DIAGNOSIS — L89894 Pressure ulcer of other site, stage 4: Secondary | ICD-10-CM | POA: Diagnosis not present

## 2019-10-22 DIAGNOSIS — M40204 Unspecified kyphosis, thoracic region: Secondary | ICD-10-CM | POA: Diagnosis not present

## 2019-10-22 DIAGNOSIS — M4316 Spondylolisthesis, lumbar region: Secondary | ICD-10-CM | POA: Diagnosis not present

## 2019-10-22 DIAGNOSIS — M4646 Discitis, unspecified, lumbar region: Secondary | ICD-10-CM | POA: Diagnosis not present

## 2019-10-22 DIAGNOSIS — G834 Cauda equina syndrome: Secondary | ICD-10-CM | POA: Diagnosis not present

## 2019-10-22 DIAGNOSIS — M4604 Spinal enthesopathy, thoracic region: Secondary | ICD-10-CM | POA: Diagnosis not present

## 2019-10-22 DIAGNOSIS — J9811 Atelectasis: Secondary | ICD-10-CM | POA: Diagnosis not present

## 2019-10-22 DIAGNOSIS — G4700 Insomnia, unspecified: Secondary | ICD-10-CM | POA: Diagnosis not present

## 2019-10-23 ENCOUNTER — Ambulatory Visit: Payer: BC Managed Care – PPO | Admitting: Physician Assistant

## 2019-10-23 DIAGNOSIS — J9811 Atelectasis: Secondary | ICD-10-CM | POA: Diagnosis not present

## 2019-10-23 DIAGNOSIS — M4604 Spinal enthesopathy, thoracic region: Secondary | ICD-10-CM | POA: Diagnosis not present

## 2019-10-23 DIAGNOSIS — L89894 Pressure ulcer of other site, stage 4: Secondary | ICD-10-CM | POA: Diagnosis not present

## 2019-10-23 DIAGNOSIS — D72829 Elevated white blood cell count, unspecified: Secondary | ICD-10-CM | POA: Diagnosis not present

## 2019-10-23 DIAGNOSIS — L89154 Pressure ulcer of sacral region, stage 4: Secondary | ICD-10-CM | POA: Diagnosis not present

## 2019-10-23 DIAGNOSIS — M5144 Schmorl's nodes, thoracic region: Secondary | ICD-10-CM | POA: Diagnosis not present

## 2019-10-23 DIAGNOSIS — I081 Rheumatic disorders of both mitral and tricuspid valves: Secondary | ICD-10-CM | POA: Diagnosis not present

## 2019-10-23 DIAGNOSIS — M40204 Unspecified kyphosis, thoracic region: Secondary | ICD-10-CM | POA: Diagnosis not present

## 2019-10-23 DIAGNOSIS — G4700 Insomnia, unspecified: Secondary | ICD-10-CM | POA: Diagnosis not present

## 2019-10-23 DIAGNOSIS — M4804 Spinal stenosis, thoracic region: Secondary | ICD-10-CM | POA: Diagnosis not present

## 2019-10-23 DIAGNOSIS — M47816 Spondylosis without myelopathy or radiculopathy, lumbar region: Secondary | ICD-10-CM | POA: Diagnosis not present

## 2019-10-23 DIAGNOSIS — M4316 Spondylolisthesis, lumbar region: Secondary | ICD-10-CM | POA: Diagnosis not present

## 2019-10-23 DIAGNOSIS — M4646 Discitis, unspecified, lumbar region: Secondary | ICD-10-CM | POA: Diagnosis not present

## 2019-10-23 DIAGNOSIS — N319 Neuromuscular dysfunction of bladder, unspecified: Secondary | ICD-10-CM | POA: Diagnosis not present

## 2019-10-23 DIAGNOSIS — G834 Cauda equina syndrome: Secondary | ICD-10-CM | POA: Diagnosis not present

## 2019-10-23 DIAGNOSIS — M48061 Spinal stenosis, lumbar region without neurogenic claudication: Secondary | ICD-10-CM | POA: Diagnosis not present

## 2019-10-23 DIAGNOSIS — L89312 Pressure ulcer of right buttock, stage 2: Secondary | ICD-10-CM | POA: Diagnosis not present

## 2019-10-24 DIAGNOSIS — M4646 Discitis, unspecified, lumbar region: Secondary | ICD-10-CM | POA: Diagnosis not present

## 2019-10-24 DIAGNOSIS — M47816 Spondylosis without myelopathy or radiculopathy, lumbar region: Secondary | ICD-10-CM | POA: Diagnosis not present

## 2019-10-24 DIAGNOSIS — M48061 Spinal stenosis, lumbar region without neurogenic claudication: Secondary | ICD-10-CM | POA: Diagnosis not present

## 2019-10-24 DIAGNOSIS — G834 Cauda equina syndrome: Secondary | ICD-10-CM | POA: Diagnosis not present

## 2019-10-24 DIAGNOSIS — L89154 Pressure ulcer of sacral region, stage 4: Secondary | ICD-10-CM | POA: Diagnosis not present

## 2019-10-24 DIAGNOSIS — J9811 Atelectasis: Secondary | ICD-10-CM | POA: Diagnosis not present

## 2019-10-24 DIAGNOSIS — D72829 Elevated white blood cell count, unspecified: Secondary | ICD-10-CM | POA: Diagnosis not present

## 2019-10-24 DIAGNOSIS — L89312 Pressure ulcer of right buttock, stage 2: Secondary | ICD-10-CM | POA: Diagnosis not present

## 2019-10-24 DIAGNOSIS — M4804 Spinal stenosis, thoracic region: Secondary | ICD-10-CM | POA: Diagnosis not present

## 2019-10-24 DIAGNOSIS — M4316 Spondylolisthesis, lumbar region: Secondary | ICD-10-CM | POA: Diagnosis not present

## 2019-10-24 DIAGNOSIS — M4604 Spinal enthesopathy, thoracic region: Secondary | ICD-10-CM | POA: Diagnosis not present

## 2019-10-24 DIAGNOSIS — M5144 Schmorl's nodes, thoracic region: Secondary | ICD-10-CM | POA: Diagnosis not present

## 2019-10-24 DIAGNOSIS — N319 Neuromuscular dysfunction of bladder, unspecified: Secondary | ICD-10-CM | POA: Diagnosis not present

## 2019-10-24 DIAGNOSIS — G4700 Insomnia, unspecified: Secondary | ICD-10-CM | POA: Diagnosis not present

## 2019-10-24 DIAGNOSIS — L89894 Pressure ulcer of other site, stage 4: Secondary | ICD-10-CM | POA: Diagnosis not present

## 2019-10-24 DIAGNOSIS — I081 Rheumatic disorders of both mitral and tricuspid valves: Secondary | ICD-10-CM | POA: Diagnosis not present

## 2019-10-24 DIAGNOSIS — M40204 Unspecified kyphosis, thoracic region: Secondary | ICD-10-CM | POA: Diagnosis not present

## 2019-10-26 DIAGNOSIS — M4316 Spondylolisthesis, lumbar region: Secondary | ICD-10-CM | POA: Diagnosis not present

## 2019-10-26 DIAGNOSIS — D72829 Elevated white blood cell count, unspecified: Secondary | ICD-10-CM | POA: Diagnosis not present

## 2019-10-26 DIAGNOSIS — M5144 Schmorl's nodes, thoracic region: Secondary | ICD-10-CM | POA: Diagnosis not present

## 2019-10-26 DIAGNOSIS — M4604 Spinal enthesopathy, thoracic region: Secondary | ICD-10-CM | POA: Diagnosis not present

## 2019-10-26 DIAGNOSIS — J9811 Atelectasis: Secondary | ICD-10-CM | POA: Diagnosis not present

## 2019-10-26 DIAGNOSIS — G834 Cauda equina syndrome: Secondary | ICD-10-CM | POA: Diagnosis not present

## 2019-10-26 DIAGNOSIS — M48061 Spinal stenosis, lumbar region without neurogenic claudication: Secondary | ICD-10-CM | POA: Diagnosis not present

## 2019-10-26 DIAGNOSIS — M40204 Unspecified kyphosis, thoracic region: Secondary | ICD-10-CM | POA: Diagnosis not present

## 2019-10-26 DIAGNOSIS — L89312 Pressure ulcer of right buttock, stage 2: Secondary | ICD-10-CM | POA: Diagnosis not present

## 2019-10-26 DIAGNOSIS — M47816 Spondylosis without myelopathy or radiculopathy, lumbar region: Secondary | ICD-10-CM | POA: Diagnosis not present

## 2019-10-26 DIAGNOSIS — G4700 Insomnia, unspecified: Secondary | ICD-10-CM | POA: Diagnosis not present

## 2019-10-26 DIAGNOSIS — M4804 Spinal stenosis, thoracic region: Secondary | ICD-10-CM | POA: Diagnosis not present

## 2019-10-26 DIAGNOSIS — L89894 Pressure ulcer of other site, stage 4: Secondary | ICD-10-CM | POA: Diagnosis not present

## 2019-10-26 DIAGNOSIS — M4646 Discitis, unspecified, lumbar region: Secondary | ICD-10-CM | POA: Diagnosis not present

## 2019-10-26 DIAGNOSIS — N319 Neuromuscular dysfunction of bladder, unspecified: Secondary | ICD-10-CM | POA: Diagnosis not present

## 2019-10-26 DIAGNOSIS — I081 Rheumatic disorders of both mitral and tricuspid valves: Secondary | ICD-10-CM | POA: Diagnosis not present

## 2019-10-29 DIAGNOSIS — D72829 Elevated white blood cell count, unspecified: Secondary | ICD-10-CM | POA: Diagnosis not present

## 2019-10-29 DIAGNOSIS — I081 Rheumatic disorders of both mitral and tricuspid valves: Secondary | ICD-10-CM | POA: Diagnosis not present

## 2019-10-29 DIAGNOSIS — N319 Neuromuscular dysfunction of bladder, unspecified: Secondary | ICD-10-CM | POA: Diagnosis not present

## 2019-10-29 DIAGNOSIS — M5144 Schmorl's nodes, thoracic region: Secondary | ICD-10-CM | POA: Diagnosis not present

## 2019-10-29 DIAGNOSIS — L89894 Pressure ulcer of other site, stage 4: Secondary | ICD-10-CM | POA: Diagnosis not present

## 2019-10-29 DIAGNOSIS — M48061 Spinal stenosis, lumbar region without neurogenic claudication: Secondary | ICD-10-CM | POA: Diagnosis not present

## 2019-10-29 DIAGNOSIS — M40204 Unspecified kyphosis, thoracic region: Secondary | ICD-10-CM | POA: Diagnosis not present

## 2019-10-29 DIAGNOSIS — M47816 Spondylosis without myelopathy or radiculopathy, lumbar region: Secondary | ICD-10-CM | POA: Diagnosis not present

## 2019-10-29 DIAGNOSIS — M4804 Spinal stenosis, thoracic region: Secondary | ICD-10-CM | POA: Diagnosis not present

## 2019-10-29 DIAGNOSIS — M4604 Spinal enthesopathy, thoracic region: Secondary | ICD-10-CM | POA: Diagnosis not present

## 2019-10-29 DIAGNOSIS — M4316 Spondylolisthesis, lumbar region: Secondary | ICD-10-CM | POA: Diagnosis not present

## 2019-10-29 DIAGNOSIS — G4700 Insomnia, unspecified: Secondary | ICD-10-CM | POA: Diagnosis not present

## 2019-10-29 DIAGNOSIS — J9811 Atelectasis: Secondary | ICD-10-CM | POA: Diagnosis not present

## 2019-10-29 DIAGNOSIS — L89312 Pressure ulcer of right buttock, stage 2: Secondary | ICD-10-CM | POA: Diagnosis not present

## 2019-10-29 DIAGNOSIS — M4646 Discitis, unspecified, lumbar region: Secondary | ICD-10-CM | POA: Diagnosis not present

## 2019-10-29 DIAGNOSIS — G834 Cauda equina syndrome: Secondary | ICD-10-CM | POA: Diagnosis not present

## 2019-10-30 DIAGNOSIS — G834 Cauda equina syndrome: Secondary | ICD-10-CM | POA: Diagnosis not present

## 2019-10-30 DIAGNOSIS — M48061 Spinal stenosis, lumbar region without neurogenic claudication: Secondary | ICD-10-CM | POA: Diagnosis not present

## 2019-10-30 DIAGNOSIS — M4316 Spondylolisthesis, lumbar region: Secondary | ICD-10-CM | POA: Diagnosis not present

## 2019-10-30 DIAGNOSIS — M47816 Spondylosis without myelopathy or radiculopathy, lumbar region: Secondary | ICD-10-CM | POA: Diagnosis not present

## 2019-10-30 DIAGNOSIS — M5144 Schmorl's nodes, thoracic region: Secondary | ICD-10-CM | POA: Diagnosis not present

## 2019-10-30 DIAGNOSIS — L89894 Pressure ulcer of other site, stage 4: Secondary | ICD-10-CM | POA: Diagnosis not present

## 2019-10-30 DIAGNOSIS — M4646 Discitis, unspecified, lumbar region: Secondary | ICD-10-CM | POA: Diagnosis not present

## 2019-10-30 DIAGNOSIS — N319 Neuromuscular dysfunction of bladder, unspecified: Secondary | ICD-10-CM | POA: Diagnosis not present

## 2019-10-30 DIAGNOSIS — J9811 Atelectasis: Secondary | ICD-10-CM | POA: Diagnosis not present

## 2019-10-30 DIAGNOSIS — M4604 Spinal enthesopathy, thoracic region: Secondary | ICD-10-CM | POA: Diagnosis not present

## 2019-10-30 DIAGNOSIS — D72829 Elevated white blood cell count, unspecified: Secondary | ICD-10-CM | POA: Diagnosis not present

## 2019-10-30 DIAGNOSIS — L89312 Pressure ulcer of right buttock, stage 2: Secondary | ICD-10-CM | POA: Diagnosis not present

## 2019-10-30 DIAGNOSIS — G4700 Insomnia, unspecified: Secondary | ICD-10-CM | POA: Diagnosis not present

## 2019-10-30 DIAGNOSIS — M40204 Unspecified kyphosis, thoracic region: Secondary | ICD-10-CM | POA: Diagnosis not present

## 2019-10-30 DIAGNOSIS — I081 Rheumatic disorders of both mitral and tricuspid valves: Secondary | ICD-10-CM | POA: Diagnosis not present

## 2019-10-30 DIAGNOSIS — M4804 Spinal stenosis, thoracic region: Secondary | ICD-10-CM | POA: Diagnosis not present

## 2019-10-31 ENCOUNTER — Other Ambulatory Visit: Payer: Self-pay

## 2019-10-31 ENCOUNTER — Encounter: Payer: BC Managed Care – PPO | Attending: Internal Medicine | Admitting: Internal Medicine

## 2019-10-31 DIAGNOSIS — M4316 Spondylolisthesis, lumbar region: Secondary | ICD-10-CM | POA: Diagnosis not present

## 2019-10-31 DIAGNOSIS — G834 Cauda equina syndrome: Secondary | ICD-10-CM | POA: Diagnosis not present

## 2019-10-31 DIAGNOSIS — L89894 Pressure ulcer of other site, stage 4: Secondary | ICD-10-CM | POA: Diagnosis not present

## 2019-10-31 DIAGNOSIS — L89154 Pressure ulcer of sacral region, stage 4: Secondary | ICD-10-CM | POA: Diagnosis not present

## 2019-10-31 DIAGNOSIS — T8131XD Disruption of external operation (surgical) wound, not elsewhere classified, subsequent encounter: Secondary | ICD-10-CM | POA: Insufficient documentation

## 2019-10-31 DIAGNOSIS — T8131XA Disruption of external operation (surgical) wound, not elsewhere classified, initial encounter: Secondary | ICD-10-CM | POA: Diagnosis not present

## 2019-10-31 DIAGNOSIS — L0231 Cutaneous abscess of buttock: Secondary | ICD-10-CM | POA: Insufficient documentation

## 2019-10-31 DIAGNOSIS — M4804 Spinal stenosis, thoracic region: Secondary | ICD-10-CM | POA: Diagnosis not present

## 2019-10-31 DIAGNOSIS — M4646 Discitis, unspecified, lumbar region: Secondary | ICD-10-CM | POA: Diagnosis not present

## 2019-10-31 DIAGNOSIS — M4604 Spinal enthesopathy, thoracic region: Secondary | ICD-10-CM | POA: Diagnosis not present

## 2019-10-31 DIAGNOSIS — D72829 Elevated white blood cell count, unspecified: Secondary | ICD-10-CM | POA: Diagnosis not present

## 2019-10-31 DIAGNOSIS — M47816 Spondylosis without myelopathy or radiculopathy, lumbar region: Secondary | ICD-10-CM | POA: Diagnosis not present

## 2019-10-31 DIAGNOSIS — M5144 Schmorl's nodes, thoracic region: Secondary | ICD-10-CM | POA: Diagnosis not present

## 2019-10-31 DIAGNOSIS — N319 Neuromuscular dysfunction of bladder, unspecified: Secondary | ICD-10-CM | POA: Diagnosis not present

## 2019-10-31 DIAGNOSIS — L89312 Pressure ulcer of right buttock, stage 2: Secondary | ICD-10-CM | POA: Diagnosis not present

## 2019-10-31 DIAGNOSIS — J9811 Atelectasis: Secondary | ICD-10-CM | POA: Diagnosis not present

## 2019-10-31 DIAGNOSIS — I081 Rheumatic disorders of both mitral and tricuspid valves: Secondary | ICD-10-CM | POA: Diagnosis not present

## 2019-10-31 DIAGNOSIS — G4700 Insomnia, unspecified: Secondary | ICD-10-CM | POA: Diagnosis not present

## 2019-10-31 DIAGNOSIS — X58XXXD Exposure to other specified factors, subsequent encounter: Secondary | ICD-10-CM | POA: Insufficient documentation

## 2019-10-31 DIAGNOSIS — M48061 Spinal stenosis, lumbar region without neurogenic claudication: Secondary | ICD-10-CM | POA: Diagnosis not present

## 2019-10-31 DIAGNOSIS — M40204 Unspecified kyphosis, thoracic region: Secondary | ICD-10-CM | POA: Diagnosis not present

## 2019-11-01 NOTE — Progress Notes (Signed)
Merkey, BRAD CUNA ([id number]) Visit Report for 10/31/2019 Arrival Information Details Patient Name: Nase, FAIN PINN. Date of Service: 10/31/2019 9:30 AM Medical Record Number: [id number] Patient Account Number: [id number] Date of Birth/Sex: 1965/11/07 (54 y.o. M) Treating RN: Montey Hora Primary Care Marayah Higdon: [name] Other Clinician: Referring Shylo Zamor: [name] Treating Melvinia Ashby/Extender: [name] in Treatment: 8 Visit Information History Since Last Visit Added or deleted any medications: No Patient Arrived: Walker Any new allergies or adverse reactions: No Arrival Time: 09:36 Had a fall or experienced change in No Accompanied By: self activities of daily living that may affect Transfer Assistance: None risk of falls: Patient Identification Verified: Yes Signs or symptoms of abuse/neglect since last visito No Secondary Verification Process Completed: Yes Hospitalized since last visit: No Implantable device outside of the clinic excluding No cellular tissue based products placed in the center since last visit: Has Dressing in Place as Prescribed: Yes Pain Present Now: No Electronic Signature(s) Signed: 10/31/2019 4:17:41 PM By: Montey Hora Entered By: Montey Hora on 10/31/2019 09:38:20 Ashton, [name] ([id number]) -------------------------------------------------------------------------------- Clinic Level of Care Assessment Details Patient Name: Mckenzie, [name] Date of Service: 10/31/2019 9:30 AM Medical Record Number: [id number] Patient Account Number: [id number] Date of Birth/Sex: 01/07/1966 (54 y.o. M) Treating RN: Army Melia Primary Care Chrystal Zeimet: [name] Other Clinician: Referring Lionel Woodberry: [name] Treating Jeanett Antonopoulos/Extender: [name] in Treatment: 8 Clinic Level of Care Assessment Items TOOL 4 Quantity Score []  - Use when only an EandM is performed on FOLLOW-UP visit 0 ASSESSMENTS - Nursing Assessment /  Reassessment X - Reassessment of Co-morbidities (includes updates in patient status) 1 10 X- 1 5 Reassessment of Adherence to Treatment Plan ASSESSMENTS - Wound and Skin Assessment / Reassessment X - Simple Wound Assessment / Reassessment - one wound 1 5 []  - 0 Complex Wound Assessment / Reassessment - multiple wounds []  - 0 Dermatologic / Skin Assessment (not related to wound area) ASSESSMENTS - Focused Assessment []  - Circumferential Edema Measurements - multi extremities 0 []  - 0 Nutritional Assessment / Counseling / Intervention []  - 0 Lower Extremity Assessment (monofilament, tuning fork, pulses) []  - 0 Peripheral Arterial Disease Assessment (using hand held doppler) ASSESSMENTS - Ostomy and/or Continence Assessment and Care []  - Incontinence Assessment and Management 0 []  - 0 Ostomy Care Assessment and Management (repouching, etc.) PROCESS - Coordination of Care X - Simple Patient / Family Education for ongoing care 1 15 []  - 0 Complex (extensive) Patient / Family Education for ongoing care []  - 0 Staff obtains Programmer, systems, Records, Test Results / Process Orders []  - 0 Staff telephones HHA, Nursing Homes / Clarify orders / etc []  - 0 Routine Transfer to another Facility (non-emergent condition) []  - 0 Routine Hospital Admission (non-emergent condition) []  - 0 New Admissions / Biomedical engineer / Ordering NPWT, Apligraf, etc. []  - 0 Emergency Hospital Admission (emergent condition) X- 1 10 Simple Discharge Coordination []  - 0 Complex (extensive) Discharge Coordination PROCESS - Special Needs []  - Pediatric / Minor Patient Management 0 []  - 0 Isolation Patient Management []  - 0 Hearing / Language / Visual special needs []  - 0 Assessment of Community assistance (transportation, D/C planning, etc.) Leino, Moishy R. ([id number]) []  - 0 Additional assistance / Altered mentation []  - 0 Support Surface(s) Assessment (bed, cushion, seat, etc.) INTERVENTIONS -  Wound Cleansing / Measurement X - Simple Wound Cleansing - one wound 1 5 []  - 0 Complex Wound Cleansing - multiple wounds X- 1 5 Wound Imaging (  photographs - any number of wounds) []  - 0 Wound Tracing (instead of photographs) X- 1 5 Simple Wound Measurement - one wound []  - 0 Complex Wound Measurement - multiple wounds INTERVENTIONS - Wound Dressings []  - Small Wound Dressing one or multiple wounds 0 X- 1 15 Medium Wound Dressing one or multiple wounds []  - 0 Large Wound Dressing one or multiple wounds []  - 0 Application of Medications - topical []  - 0 Application of Medications - injection INTERVENTIONS - Miscellaneous []  - External ear exam 0 []  - 0 Specimen Collection (cultures, biopsies, blood, body fluids, etc.) []  - 0 Specimen(s) / Culture(s) sent or taken to Lab for analysis []  - 0 Patient Transfer (multiple staff / Civil Service fast streamer / Similar devices) []  - 0 Simple Staple / Suture removal (25 or less) []  - 0 Complex Staple / Suture removal (26 or more) []  - 0 Hypo / Hyperglycemic Management (close monitor of Blood Glucose) []  - 0 Ankle / Brachial Index (ABI) - do not check if billed separately X- 1 5 Vital Signs Has the patient been seen at the hospital within the last three years: Yes Total Score: 80 Level Of Care: New/Established - Level 3 Electronic Signature(s) Signed: 10/31/2019 11:30:14 AM By: Army Melia Entered By: Army Melia on 10/31/2019 09:55:00 [place]ington, [name] ([id number]) -------------------------------------------------------------------------------- Encounter Discharge Information Details Patient Name: Lipinski, [name] Date of Service: 10/31/2019 9:30 AM Medical Record Number: [id number] Patient Account Number: [id number] Date of Birth/Sex: 08/05/1965 (54 y.o. M) Treating RN: Army Melia Primary Care Safiyah Cisney: [name] Other Clinician: Referring Ivis Henneman: [name] Treating Brock Mokry/Extender: [name] in Treatment:  8 Encounter Discharge Information Items Discharge Condition: Stable Ambulatory Status: Walker Discharge Destination: Home Transportation: Private Auto Accompanied By: self Schedule Follow-up Appointment: Yes Clinical Summary of Care: Electronic Signature(s) Signed: 10/31/2019 11:30:14 AM By: Army Melia Entered By: Army Melia on 10/31/2019 09:55:56 Depaul, [name] ([id number]) -------------------------------------------------------------------------------- Lower Extremity Assessment Details Patient Name: Jaeger, [name] Date of Service: 10/31/2019 9:30 AM Medical Record Number: [id number] Patient Account Number: [id number] Date of Birth/Sex: 01/26/1966 (54 y.o. M) Treating RN: Montey Hora Primary Care Hari Casaus: [name] Other Clinician: Referring Judyth Demarais: [name] Treating Devina Bezold/Extender: Ricard Dillon Weeks in Treatment: 8 Electronic Signature(s) Signed: 10/31/2019 4:17:41 PM By: Montey Hora Entered By: Montey Hora on 10/31/2019 09:39:26 Gunning, [name] ([id number]) -------------------------------------------------------------------------------- Multi Wound Chart Details Patient Name: Mcneish, [name] Date of Service: 10/31/2019 9:30 AM Medical Record Number: [id number] Patient Account Number: [id number] Date of Birth/Sex: 09/19/1965 (54 y.o. M) Treating RN: Army Melia Primary Care Janah Mcculloh: [name] Other Clinician: Referring Niani Mourer: [name] Treating Regine Christian/Extender: [name] in Treatment: 8 Vital Signs Height(in): 72 Pulse(bpm): 89 Weight(lbs): 230 Blood Pressure(mmHg): 107/55 Body Mass Index(BMI): 31 Temperature(F): 98.4 Respiratory Rate(breaths/min): 16 Photos: [N/A:N/A] Wound Location: Midline Sacrum N/A N/A Wounding Event: Gradually Appeared N/A N/A Primary Etiology: Abscess N/A N/A Date Acquired: 07/24/2019 N/A N/A Weeks of Treatment: 8 N/A N/A Wound Status: Open N/A N/A Measurements L x W x D (cm)  1.6x2.7x3 N/A N/A Area (cm) : 3.393 N/A N/A Volume (cm) : 10.179 N/A N/A % Reduction in Area: 59.00% N/A N/A % Reduction in Volume: 72.70% N/A N/A Starting Position 1 (o'clock): 1 Ending Position 1 (o'clock): 1 Maximum Distance 1 (cm): 4 Undermining: Yes N/A N/A Classification: Full Thickness With Exposed N/A N/A Support Structures Exudate Amount: Medium N/A N/A Exudate Type: Serosanguineous N/A N/A Exudate Color: red, brown N/A N/A Foul Odor After Cleansing: Yes  N/A N/A Odor Anticipated Due to Product No N/A N/A Use: Wound Margin: Thickened N/A N/A Granulation Amount: Large (67-100%) N/A N/A Granulation Quality: Red N/A N/A Necrotic Amount: Small (1-33%) N/A N/A Exposed Structures: Fat Layer (Subcutaneous Tissue) N/A N/A Exposed: Yes Muscle: Yes Fascia: No Tendon: No Joint: No Bone: No Epithelialization: None N/A N/A Treatment Notes Tyer, Yussuf R. ([id number]) Wound #1 (Midline Sacrum) Notes Wet to dry with hydrogel; bordered foam dressing. NPWT to be applied by home health. Electronic Signature(s) Signed: 10/31/2019 4:54:03 PM By: Linton Ham MD Entered By: Linton Ham on 10/31/2019 10:01:44 Postlewaite, [name] ([id number]) -------------------------------------------------------------------------------- [place] Details Patient Name: Banwart, [name] Date of Service: 10/31/2019 9:30 AM Medical Record Number: [id number] Patient Account Number: [id number] Date of Birth/Sex: 12/04/1965 (54 y.o. M) Treating RN: Army Melia Primary Care Richell Corker: [name] Other Clinician: Referring Tao Satz: [name] Treating Katherinne Mofield/Extender: [name] in Treatment: 8 Active Inactive Necrotic Tissue Nursing Diagnoses: Impaired tissue integrity related to necrotic/devitalized tissue Goals: Necrotic/devitalized tissue will be minimized in the wound bed Date Initiated: 09/05/2019 Target Resolution Date: 09/26/2019 Goal Status:  Active Interventions: Assess patient pain level pre-, during and post procedure and prior to discharge Treatment Activities: Apply topical anesthetic as ordered : 09/05/2019 Notes: Orientation to the Wound Care Program Nursing Diagnoses: Knowledge deficit related to the wound healing center program Goals: Patient/caregiver will verbalize understanding of the [place] Date Initiated: 09/05/2019 Target Resolution Date: 09/26/2019 Goal Status: Active Interventions: Provide education on orientation to the wound center Notes: Pressure Nursing Diagnoses: Knowledge deficit related to management of pressures ulcers Goals: Patient will remain free from development of additional pressure ulcers Date Initiated: 09/05/2019 Target Resolution Date: 10/03/2019 Goal Status: Active Patient/caregiver will verbalize risk factors for pressure ulcer development Date Initiated: 09/05/2019 Target Resolution Date: 09/26/2019 Goal Status: Active Patient/caregiver will verbalize understanding of pressure ulcer management Date Initiated: 09/05/2019 Target Resolution Date: 10/03/2019 Goal Status: Active Interventions: Mcnear, Chayson R. ([id number]) Assess: immobility, friction, shearing, incontinence upon admission and as needed Provide education on pressure ulcers Notes: Wound/Skin Impairment Nursing Diagnoses: Impaired tissue integrity Goals: Patient/caregiver will verbalize understanding of skin care regimen Date Initiated: 09/05/2019 Target Resolution Date: 09/26/2019 Goal Status: Active Ulcer/skin breakdown will have a volume reduction of 30% by week 4 Date Initiated: 09/05/2019 Target Resolution Date: 10/03/2019 Goal Status: Active Interventions: Assess ulceration(s) every visit Treatment Activities: Referred to DME Eastyn Skalla for dressing supplies : 09/05/2019 Notes: Electronic Signature(s) Signed: 10/31/2019 11:30:14 AM By: Army Melia Entered By: Army Melia on 10/31/2019  09:52:09 Deem, [name] ([id number]) -------------------------------------------------------------------------------- Pain Assessment Details Patient Name: Colley, [name] Date of Service: 10/31/2019 9:30 AM Medical Record Number: [id number] Patient Account Number: [id number] Date of Birth/Sex: 10-20-1965 (54 y.o. M) Treating RN: Montey Hora Primary Care Kiyani Jernigan: [name] Other Clinician: Referring Osha Rane: [name] Treating Kyndal Heringer/Extender: [name] in Treatment: 8 Active Problems Location of Pain Severity and Description of Pain Patient Has Paino No Site Locations Pain Management and Medication Current Pain Management: Electronic Signature(s) Signed: 10/31/2019 4:17:41 PM By: Montey Hora Entered By: Montey Hora on 10/31/2019 09:39:16 Barradas, [name] ([id number]) -------------------------------------------------------------------------------- Patient/Caregiver Education Details Patient Name: Gwynn, [name] Date of Service: 10/31/2019 9:30 AM Medical Record Number: [id number] Patient Account Number: [id number] Date of Birth/Gender: Dec 06, 1965 (54 y.o. M) Treating RN: Army Melia Primary Care Physician: [name] Other Clinician: Referring Physician: [name] Treating Physician/Extender: [name] in Treatment: 8 Education Assessment Education Provided To: Patient Education Topics  Provided Wound/Skin Impairment: Handouts: Caring for Your Ulcer Methods: Demonstration, Explain/Verbal Responses: State content correctly Electronic Signature(s) Signed: 10/31/2019 11:30:14 AM By: Army Melia Entered By: Army Melia on 10/31/2019 09:55:14 Brave, [name] ([id number]) -------------------------------------------------------------------------------- Wound Assessment Details Patient Name: Schuessler, Dub R. Date of Service: 10/31/2019 9:30 AM Medical Record Number: [id number] Patient Account Number: [id number] Date of Birth/Sex:  [birth year] (54 y.o. M) Treating RN: Montey Hora Primary Care Janki Dike: [name] Other Clinician: Referring Alfrieda Tarry: [name] Treating Kemari Narez/Extender: [name] in Treatment: 8 Wound Status Wound Number: 1 Primary Etiology: Abscess Wound Location: Midline Sacrum Wound Status: Open Wounding Event: Gradually Appeared Date Acquired: 07/24/2019 Weeks Of Treatment: 8 Clustered Wound: No Photos Wound Measurements Length: (cm) 1.6 Width: (cm) 2.7 Depth: (cm) 3 Area: (cm) 3.393 Volume: (cm) 10.179 % Reduction in Area: 59% % Reduction in Volume: 72.7% Epithelialization: None Tunneling: No Undermining: Yes Starting Position (o'clock): 1 Ending Position (o'clock): 1 Maximum Distance: (cm) 4 Wound Description Classification: Full Thickness With Exposed Support Structure Wound Margin: Thickened Exudate Amount: Medium Exudate Type: Serosanguineous Exudate Color: red, brown s Foul Odor After Cleansing: Yes Due to Product Use: No Slough/Fibrino No Wound Bed Granulation Amount: Large (67-100%) Exposed Structure Granulation Quality: Red Fascia Exposed: No Necrotic Amount: Small (1-33%) Fat Layer (Subcutaneous Tissue) Exposed: Yes Necrotic Quality: Adherent Slough Tendon Exposed: No Muscle Exposed: Yes Necrosis of Muscle: No Joint Exposed: No Bone Exposed: No Treatment Notes Wound #1 (Midline Sacrum) Sozio, Hal R. ([id number]) Notes Wet to dry with hydrogel; bordered foam dressing. NPWT to be applied by home health. Electronic Signature(s) Signed: 10/31/2019 4:17:41 PM By: Montey Hora Entered By: Montey Hora on 10/31/2019 09:48:18 Jetter, [name] ([id number]) -------------------------------------------------------------------------------- [place] Details Patient Name: Delph, [name] Date of Service: 10/31/2019 9:30 AM Medical Record Number: [id number] Patient Account Number: [id number] Date of Birth/Sex: 07/13/1966 (54 y.o. M) Treating RN:  Montey Hora Primary Care Travia Onstad: [name] Other Clinician: Referring Rital Cavey: [name] Treating Natayla Cadenhead/Extender: [name] in Treatment: 8 Vital Signs Time Taken: 09:38 Temperature (F): 98.4 Height (in): 72 Pulse (bpm): 89 Weight (lbs): 230 Respiratory Rate (breaths/min): 16 Body Mass Index (BMI): 31.2 Blood Pressure (mmHg): 107/55 Reference Range: 80 - 120 mg / dl Electronic Signature(s) Signed: 10/31/2019 4:17:41 PM By: Montey Hora Entered By: Montey Hora on 10/31/2019 09:38:44

## 2019-11-01 NOTE — Progress Notes (Signed)
[name], [name] ([id number]) Visit Report for 10/31/2019 HPI Details Patient Name: [name] Date of Service: 10/31/2019 9:30 AM Medical Record Number: [id number] Patient Account Number: [id number] Date of Birth/Sex: [birth year] (54 y.o. M) Treating RN: [name] Primary Care Provider: [name] Other Clinician: Referring Provider: [name] Treating Provider/Extender: [name] in Treatment: 8 History of Present Illness HPI Description: ADMISSION 09/05/2019 This is a 54 year old man who has a complicated history which started with acute low back pain sometime in September. Saw his primary doctor had a CT scan of the abdomen and pelvis that was normal. By late December he was discovered to have a lumbar epidural abscess with cauda equina syndrome. He was admitted to hospital and had a decompressive laminectomy at L3/L4/L5 blood cultures at the time showed MSSA he received IV nafcillin. He went to rehab from 1/5 through 1/15 there he was noted to have an "pressure injury in the skin" although I did not see much more about this in the discharge instructions. Unfortunately this wound became infected after he was discharged home. He required readmission from 1/25 through 2/2 with sepsis, discovered to have a right psoas abscess as well as an infected decubitus ulcer. He had a bedside debridement by general surgery on 1/25 interventional radiology drain the right psoas abscess. General surgery recommended 3 times daily wet-to-dry dressings and an air mattress. He has since been discharged home. He is mobile with a walker. They are using 3 times daily wet-to-dry dressings. According to his family he is eating well. He has a protein supplement but he he is not taking it. He is currently on ampicillin sulbactam as recommended by [name] this is due to be finished currently on 2/22 although there seem to be some suggestion it might go longer. They say he had blood work 2 days  ago which I will review. The last lab work I saw was a sedimentation rate of 81 and a C-reactive protein of 212 on 1/25. I did not see any imaging studies of the underlying bone although I will need to review the CT scans that he has had Past medical history, polymyalgia rheumatica, left total hip replacement in 2018 for avascular necrosis, cellulitis of the scrotum in July 2020, history of alcohol abuse but that is not currently problematic 09/12/2019; patient readmitted to the clinic last week. I put him on a wet-to-dry dressing with silver sorb gel to the larger wound area on his coccyx and proximal right buttocks. These are actually connected. In general the wound surfaces look quite good although they are deep and there is a large amount of undermining. There is no exposed bone. Last lab work on 2/8 that was ordered by [name] showed a CRP of 68.5 and a sedimentation rate of greater than 130. The CRP was elevated versus 18.3 on 1/10 although it is difficult to interpret these necessarily in somebody with polymyalgia rheumatica. His white count was 15.7 hemoglobin 10.7 The patient is offloading this is much as he can says that he is eating and drinking well which seems verified by his family member. 2/24; patient with a large wound on the lower sacrum and proximal right buttocks. There is a bridge of overlying skin but the wounds are connected and there is wide undermining. We are using wet-to-dry dressings. I have reviewed [name] notes of 09/17/2019 he is remove the PICC line. He had a prolonged course of IV antibiotics for the MSSA bacteremia, lumbar infection, psoas  abscess and a necrotic sacral wound. Interestingly I noticed the same history of PMR in [name] that [name] comments on. I went over the case again with the patient and his daughter he is totally unfamiliar with anything to do with polymyalgia rheumatica or its obvious symptoms that I also described. I told  him that this is not a diagnosis that most doctors would make and put in an E HR without documentation but he is just not familiar with it and neither his his daughter. For now we are using wet- to-dry dressings. I think it is time to try a wound VAC now that we are certain about underlying infection issues 3/3; he arrives today with his wound actually looks somewhat better. Two small areas with a bridge of skin actually have contracted I believe there is less depth. There is still extensive undermining. He got his VAC machine yesterday but they have still not put it on. 3/10; the orifice of the actual wound is contracting faster than the undermining dimensions of the wound. This is probably soon going to make this difficult to continue with the [facility] 10/15/2019 upon evaluation today patient appears to be doing well in general with regard to his wound. Apparently this looks good although I have not really seen him he has been seen by [name] up to this point. Nonetheless he is currently on a wound VAC and apparently has been having some odor over the past several days which is why comes in for evaluation earlier today. I am going obtain a wound culture and then subsequently we will likely place the patient on an antibiotic at this point to try to help out with the odor which I presume is coming from a bacterial infection. The patient has been off of all antibiotics for the past month having ended on 22 February. With that being said he is not having any increased pain or anything else going on at this point which is good news. He is not allergic to any antibiotics that he is aware of. 4/7; it has been almost a month since I saw this patient although he was here 2 weeks ago. He had a wound VAC on this wound. I note that he was treated for infection with 2 weeks of Levaquin. We had a report recently from home health that there was an extensive rash apparently in the periwound and they took the [facility] off  for a few days however the rash appears to have cleaned up and the wound VAC was back on on his presentation today. Since I saw this last the year is been considerable reduction in overall wound area Electronic Signature(s) Signed: 10/31/2019 4:54:03 PM By: Linton Ham MD Entered By: Linton Ham on 10/31/2019 10:02:55 Mower, [name] ([id number]) Dettman, [name] ([id number]) -------------------------------------------------------------------------------- Physical Exam Details Patient Name: Eller, [name] Date of Service: 10/31/2019 9:30 AM Medical Record Number: [id number] Patient Account Number: [id number] Date of Birth/Sex: 08/20/1965 (54 y.o. M) Treating RN: [name] Primary Care Provider: [name] Other Clinician: Referring Provider: [name] Treating Provider/Extender: [name] in Treatment: 8 Constitutional Sitting or standing Blood Pressure is within target range for patient.. Pulse regular and within target range for patient.Marland Kitchen Respirations regular, non- labored and within target range.. Temperature is normal and within the target range for the patient.Marland Kitchen appears in no distress. Integumentary (Hair, Skin) There is no rash in the periwound. Notes Wound exam; the wound has contracted remarkably in the almost  4 weeks since I have seen this.  Still 2 small open areas but they have pulled together dramatically. There is still considerable relative undermining but that has come in dramatically as well. There is no evidence of infection and there is no rash around the wound to be seen. Electronic Signature(s) Signed: 10/31/2019 4:54:03 PM By: Linton Ham MD Entered By: Linton Ham on 10/31/2019 10:04:53 Orne, [name] ([id number]) -------------------------------------------------------------------------------- Physician Orders Details Patient Name: Silber, [name] Date of Service: 10/31/2019 9:30 AM Medical Record Number: [id number] Patient Account  Number: [id number] Date of Birth/Sex: 09-17-1965 (54 y.o. M) Treating RN: Army Melia Primary Care Provider: [name] Other Clinician: Referring Provider: [name] Treating Provider/Extender: [name] in Treatment: 8 Verbal / Phone Orders: No Diagnosis Coding Wound Cleansing Wound #1 Midline Sacrum o Clean wound with Normal Saline. Anesthetic (add to Medication List) Wound #1 Midline Sacrum o Topical Lidocaine 4% cream applied to wound bed prior to debridement (In Clinic Only). Skin Barriers/Peri-Wound Care Wound #1 Midline Sacrum o Skin Prep Primary Wound Dressing Wound #1 Midline Sacrum o Dry Gauze - Moistened with SIlversorb Gel (hydrogel in clinic) Secondary Dressing Wound #1 Midline Sacrum o Boardered Foam Dressing Dressing Change Frequency Wound #1 Midline Sacrum o Change Dressing Monday, Wednesday, Friday - Homehealth Follow-up Appointments Wound #1 Midline Sacrum o Return Appointment in 2 weeks. Off-Loading Wound #1 Midline Sacrum o Other: - NO Pressure on the wounded areas. Additional Orders / Instructions Wound #1 Midline Sacrum o Increase protein intake. - Powder protein supplement, multivitamin o Activity as tolerated Home Health Wound #1 Midline [place] Visits - [place] Nurse may visit PRN to address patientos wound care needs. o FACE TO FACE ENCOUNTER: MEDICARE and MEDICAID PATIENTS: I certify that this patient is under my care and that I had a face-to- face encounter that meets the physician face-to-face encounter requirements with this patient on this date. The encounter with the patient was in whole or in part for the following MEDICAL CONDITION: (primary reason for [name]) MEDICAL NECESSITY: I certify, that based on my findings, NURSING services are a medically necessary home health service. HOME BOUND STATUS: I certify that my clinical findings support that  this patient is homebound (i.e., Due to illness or injury, pt requires aid of supportive devices such as crutches, cane, wheelchairs, walkers, the use of special transportation or the assistance of another person to leave their place of residence. There is a normal inability to leave the home and doing so requires considerable and taxing effort. Other absences are for medical reasons / religious services and are infrequent or of short duration when for other reasons). Mcquinn, [name] ([id number]) o If current dressing causes regression in wound condition, may D/C ordered dressing product/s and apply Normal Saline Moist Dressing daily until next [place] / Other MD appointment. [place] of regression in wound condition at [phone]. o Please direct any NON-WOUND related issues/requests for orders to patient's Primary Care Physician Negative Pressure Wound Therapy Wound #1 Midline Sacrum o Wound VAC settings at 125/130 mmHg continuous pressure. Use BLACK/GREEN foam to wound cavity. Use WHITE foam to fill any tunnel/s and/or undermining. Change VAC dressing 3 X WEEK. Change canister as indicated when full. Nurse may titrate settings and frequency of dressing changes as clinically indicated. - Home health to apply. Bridge to alternate hips at changes. Do not place where patient is sitting on suction. o Home Health Nurse  may d/c VAC for s/s of increased infection, significant wound regression, or uncontrolled drainage. [place] at [phone]. Electronic Signature(s) Signed: 10/31/2019 11:30:14 AM By: Army Melia Signed: 10/31/2019 4:54:03 PM By: Linton Ham MD Entered By: Army Melia on 10/31/2019 09:54:31 Burdett, [name] ([id number]) -------------------------------------------------------------------------------- Problem List Details Patient Name: Decaprio, [name] Date of Service: 10/31/2019 9:30 AM Medical Record Number:  [id number] Patient Account Number: [id number] Date of Birth/Sex: [birth year] (54 y.o. M) Treating RN: [name] Primary Care Provider: [name] Other Clinician: Referring Provider: [name] Treating Provider/Extender: [name] in Treatment: 8 Active Problems ICD-10 Evaluated Encounter Code Description Active Date Today Diagnosis L89.154 Pressure ulcer of sacral region, stage 4 09/05/2019 No Yes T81.31XD Disruption of external operation (surgical) wound, not elsewhere classified, 09/05/2019 No Yes subsequent encounter L02.31 Cutaneous abscess of buttock 09/05/2019 No Yes Inactive Problems Resolved Problems Electronic Signature(s) Signed: 10/31/2019 4:54:03 PM By: Linton Ham MD Entered By: Linton Ham on 10/31/2019 10:01:37 Eber, [name] ([id number]) -------------------------------------------------------------------------------- Progress Note Details Patient Name: Ricketts, [name] Date of Service: 10/31/2019 9:30 AM Medical Record Number: [id number] Patient Account Number: [id number] Date of Birth/Sex: Aug 06, 1965 (54 y.o. M) Treating RN: [name] Primary Care Provider: [name] Other Clinician: Referring Provider: [name] Treating Provider/Extender: [name] in Treatment: 8 Subjective History of Present Illness (HPI) ADMISSION 09/05/2019 This is a 54 year old man who has a complicated history which started with acute low back pain sometime in September. Saw his primary doctor had a CT scan of the abdomen and pelvis that was normal. By late December he was discovered to have a lumbar epidural abscess with cauda equina syndrome. He was admitted to hospital and had a decompressive laminectomy at L3/L4/L5 blood cultures at the time showed MSSA he received IV nafcillin. He went to rehab from 1/5 through 1/15 there he was noted to have an "pressure injury in the skin" although I did not see much more about this in the discharge  instructions. Unfortunately this wound became infected after he was discharged home. He required readmission from 1/25 through 2/2 with sepsis, discovered to have a right psoas abscess as well as an infected decubitus ulcer. He had a bedside debridement by general surgery on 1/25 interventional radiology drain the right psoas abscess. General surgery recommended 3 times daily wet-to-dry dressings and an air mattress. He has since been discharged home. He is mobile with a walker. They are using 3 times daily wet-to-dry dressings. According to his family he is eating well. He has a protein supplement but he he is not taking it. He is currently on ampicillin sulbactam as recommended by [name] this is due to be finished currently on 2/22 although there seem to be some suggestion it might go longer. They say he had blood work 2 days ago which I will review. The last lab work I saw was a sedimentation rate of 81 and a C-reactive protein of 212 on 1/25. I did not see any imaging studies of the underlying bone although I will need to review the CT scans that he has had Past medical history, polymyalgia rheumatica, left total hip replacement in 2018 for avascular necrosis, cellulitis of the scrotum in July 2020, history of alcohol abuse but that is not currently problematic 09/12/2019; patient readmitted to the clinic last week. I put him on a wet-to-dry dressing with silver sorb gel to the larger wound area on his coccyx and proximal right buttocks. These are actually connected.  In general the wound surfaces look quite good although they are deep and there is a large amount of undermining. There is no exposed bone. Last lab work on 2/8 that was ordered by [name] showed a CRP of 68.5 and a sedimentation rate of greater than 130. The CRP was elevated versus 18.3 on 1/10 although it is difficult to interpret these necessarily in somebody with polymyalgia rheumatica. His white count was 15.7 hemoglobin  10.7 The patient is offloading this is much as he can says that he is eating and drinking well which seems verified by his family member. 2/24; patient with a large wound on the lower sacrum and proximal right buttocks. There is a bridge of overlying skin but the wounds are connected and there is wide undermining. We are using wet-to-dry dressings. I have reviewed [name] notes of 09/17/2019 he is remove the PICC line. He had a prolonged course of IV antibiotics for the MSSA bacteremia, lumbar infection, psoas abscess and a necrotic sacral wound. Interestingly I noticed the same history of PMR in [place] link that [name] comments on. I went over the case again with the patient and his daughter he is totally unfamiliar with anything to do with polymyalgia rheumatica or its obvious symptoms that I also described. I told him that this is not a diagnosis that most doctors would make and put in an E HR without documentation but he is just not familiar with it and neither his his daughter. For now we are using wet- to-dry dressings. I think it is time to try a wound VAC now that we are certain about underlying infection issues 3/3; he arrives today with his wound actually looks somewhat better. Two small areas with a bridge of skin actually have contracted I believe there is less depth. There is still extensive undermining. He got his VAC machine yesterday but they have still not put it on. 3/10; the orifice of the actual wound is contracting faster than the undermining dimensions of the wound. This is probably soon going to make this difficult to continue with the [facility] 10/15/2019 upon evaluation today patient appears to be doing well in general with regard to his wound. Apparently this looks good although I have not really seen him he has been seen by [name] up to this point. Nonetheless he is currently on a wound VAC and apparently has been having some odor over the past several days  which is why comes in for evaluation earlier today. I am going obtain a wound culture and then subsequently we will likely place the patient on an antibiotic at this point to try to help out with the odor which I presume is coming from a bacterial infection. The patient has been off of all antibiotics for the past month having ended on 22 February. With that being said he is not having any increased pain or anything else going on at this point which is good news. He is not allergic to any antibiotics that he is aware of. 4/7; it has been almost a month since I saw this patient although he was here 2 weeks ago. He had a wound VAC on this wound. I note that he was treated for infection with 2 weeks of Levaquin. We had a report recently from home health that there was an extensive rash apparently in the periwound and they took the [facility] off for a few days however the rash appears to have cleaned up and the  wound VAC was back on on his presentation today. Since I saw this last the year is been considerable reduction in overall wound area Arons, [name] ([id number]) Objective Constitutional Sitting or standing Blood Pressure is within target range for patient.. Pulse regular and within target range for patient.Marland Kitchen Respirations regular, non- labored and within target range.. Temperature is normal and within the target range for the patient.Marland Kitchen appears in no distress. Vitals Time Taken: 9:38 AM, Height: 72 in, Weight: 230 lbs, BMI: 31.2, Temperature: 98.4 F, Pulse: 89 bpm, Respiratory Rate: 16 breaths/min, Blood Pressure: 107/55 mmHg. General Notes: Wound exam; the wound has contracted remarkably in the almost 4 weeks since I have seen this.  Still 2 small open areas but they have pulled together dramatically. There is still considerable relative undermining but that has come in dramatically as well. There is no evidence of infection and there is no rash around the wound to be seen. Integumentary (Hair,  Skin) There is no rash in the periwound. Wound #1 status is Open. Original cause of wound was Gradually Appeared. The wound is located on the Midline Sacrum. The wound measures 1.6cm length x 2.7cm width x 3cm depth; 3.393cm^2 area and 10.179cm^3 volume. There is muscle and Fat Layer (Subcutaneous Tissue) Exposed exposed. There is no tunneling noted, however, there is undermining starting at 1:00 and ending at 1:00 with a maximum distance of 4cm. There is a medium amount of serosanguineous drainage noted. Foul odor after cleansing was noted. The wound margin is thickened. There is large (67-100%) red granulation within the wound bed. There is a small (1-33%) amount of necrotic tissue within the wound bed including Adherent Slough. Assessment Active Problems ICD-10 Pressure ulcer of sacral region, stage 4 Disruption of external operation (surgical) wound, not elsewhere classified, subsequent encounter Cutaneous abscess of buttock Plan Wound Cleansing: Wound #1 Midline Sacrum: Clean wound with Normal Saline. Anesthetic (add to Medication List): Wound #1 Midline Sacrum: Topical Lidocaine 4% cream applied to wound bed prior to debridement (In Clinic Only). Skin Barriers/Peri-Wound Care: Wound #1 Midline Sacrum: Skin Prep Primary Wound Dressing: Wound #1 Midline Sacrum: Dry Gauze - Moistened with SIlversorb Gel (hydrogel in clinic) Secondary Dressing: Wound #1 Midline Sacrum: Boardered Foam Dressing Dressing Change Frequency: Wound #1 Midline Sacrum: Change Dressing Monday, Wednesday, Friday - Homehealth Follow-up Appointments: Wound #1 Midline Sacrum: Return Appointment in 2 weeks. Off-Loading: Wound #1 Midline Sacrum: Other: - NO Pressure on the wounded areas. Additional Orders / Instructions: Tugman, [name] FARNER. ([id number]) Wound #1 Midline Sacrum: Increase protein intake. - Powder protein supplement, multivitamin Activity as tolerated Home Health: Wound #1 Midline  Sacrum: Continue Home Health Visits - [facility] Nurse may visit PRN to address patient s wound care needs. FACE TO FACE ENCOUNTER: MEDICARE and MEDICAID PATIENTS: I certify that this patient is under my care and that I had a face-to-face encounter that meets the physician face-to-face encounter requirements with this patient on this date. The encounter with the patient was in whole or in part for the following MEDICAL CONDITION: (primary reason for [place]) MEDICAL NECESSITY: I certify, that based on my findings, NURSING services are a medically necessary home health service. HOME BOUND STATUS: I certify that my clinical findings support that this patient is homebound (i.e., Due to illness or injury, pt requires aid of supportive devices such as crutches, cane, wheelchairs, walkers, the use of special transportation or the assistance of another person to leave their place of residence. There is a normal inability  to leave the home and doing so requires considerable and taxing effort. Other absences are for medical reasons / religious services and are infrequent or of short duration when for other reasons). If current dressing causes regression in wound condition, may D/C ordered dressing product/s and apply Normal Saline Moist Dressing daily until next [place] / Other MD appointment. [place] of regression in wound condition at [phone]. Please direct any NON-WOUND related issues/requests for orders to patient's Primary Care Physician Negative Pressure Wound Therapy: Wound #1 Midline Sacrum: Wound VAC settings at 125/130 mmHg continuous pressure. Use BLACK/GREEN foam to wound cavity. Use WHITE foam to fill any tunnel/s and/or undermining. Change VAC dressing 3 X WEEK. Change canister as indicated when full. Nurse may titrate settings and frequency of dressing changes as clinically indicated. - Home health to apply. Bridge to alternate hips at  changes. Do not place where patient is sitting on suction. Home Health Nurse may d/c VAC for s/s of increased infection, significant wound regression, or uncontrolled drainage. [place] at [phone]. 1. Continue with the wound VAC. There is been a remarkable improvement in the overall area of this wound 2. I was expecting to see a large candidal rash as it is usual in patients who have wound vacs especially those that have been on systemic antibiotics but there was nothing to be seen today. His surrounding skin looks quite healthy 3. The only thing that concerns me is the amount of surface area that is actually open vis--vis the amount of undermining wound area. There is a bridge of connecting skin between the 2 open areas here. This is all always been true since the first time I saw this. I again I wondered about removing the bridge if we need to continue the wound VAC when the orifice become too tiny. 4. Overall dramatic improvement Electronic Signature(s) Signed: 10/31/2019 4:54:03 PM By: Linton Ham MD Entered By: Linton Ham on 10/31/2019 10:06:58 [name], [name] ([id number]) -------------------------------------------------------------------------------- SuperBill Details Patient Name: [name], [name] Date of Service: 10/31/2019 Medical Record Number: [id number] Patient Account Number: [id number] Date of Birth/Sex: 07/09/1966 (54 y.o. M) Treating RN: [name] Primary Care Provider: [name] Other Clinician: Referring Provider: [name] Treating Provider/Extender: [name] in Treatment: 8 Diagnosis Coding ICD-10 Codes Code Description L89.154 Pressure ulcer of sacral region, stage 4 T81.31XD Disruption of external operation (surgical) wound, not elsewhere classified, subsequent encounter L02.31 Cutaneous abscess of buttock Facility Procedures CPT4 Code: [id number] Description: 99213 - WOUND CARE VISIT-LEV 3 EST  PT Modifier: Quantity: 1 Physician Procedures CPT4 Code Description: [id number] 99213 - WC PHYS LEVEL 3 - EST PT Modifier: Quantity: 1 CPT4 Code Description: ICD-10 Diagnosis Description L89.154 Pressure ulcer of sacral region, stage 4 T81.31XD Disruption of external operation (surgical) wound, not elsewhere classified L02.31 Cutaneous abscess of buttock Modifier: , subsequent encount Quantity: er Engineer, maintenance) Signed: 10/31/2019 4:54:03 PM By: Linton Ham MD Entered By: Linton Ham on 10/31/2019 10:07:17

## 2019-11-02 DIAGNOSIS — G4700 Insomnia, unspecified: Secondary | ICD-10-CM | POA: Diagnosis not present

## 2019-11-02 DIAGNOSIS — G834 Cauda equina syndrome: Secondary | ICD-10-CM | POA: Diagnosis not present

## 2019-11-02 DIAGNOSIS — M4316 Spondylolisthesis, lumbar region: Secondary | ICD-10-CM | POA: Diagnosis not present

## 2019-11-02 DIAGNOSIS — N319 Neuromuscular dysfunction of bladder, unspecified: Secondary | ICD-10-CM | POA: Diagnosis not present

## 2019-11-02 DIAGNOSIS — D72829 Elevated white blood cell count, unspecified: Secondary | ICD-10-CM | POA: Diagnosis not present

## 2019-11-02 DIAGNOSIS — M5144 Schmorl's nodes, thoracic region: Secondary | ICD-10-CM | POA: Diagnosis not present

## 2019-11-02 DIAGNOSIS — M4646 Discitis, unspecified, lumbar region: Secondary | ICD-10-CM | POA: Diagnosis not present

## 2019-11-02 DIAGNOSIS — J9811 Atelectasis: Secondary | ICD-10-CM | POA: Diagnosis not present

## 2019-11-02 DIAGNOSIS — L89312 Pressure ulcer of right buttock, stage 2: Secondary | ICD-10-CM | POA: Diagnosis not present

## 2019-11-02 DIAGNOSIS — I081 Rheumatic disorders of both mitral and tricuspid valves: Secondary | ICD-10-CM | POA: Diagnosis not present

## 2019-11-02 DIAGNOSIS — L89894 Pressure ulcer of other site, stage 4: Secondary | ICD-10-CM | POA: Diagnosis not present

## 2019-11-02 DIAGNOSIS — M47816 Spondylosis without myelopathy or radiculopathy, lumbar region: Secondary | ICD-10-CM | POA: Diagnosis not present

## 2019-11-02 DIAGNOSIS — M4604 Spinal enthesopathy, thoracic region: Secondary | ICD-10-CM | POA: Diagnosis not present

## 2019-11-02 DIAGNOSIS — M4804 Spinal stenosis, thoracic region: Secondary | ICD-10-CM | POA: Diagnosis not present

## 2019-11-02 DIAGNOSIS — M48061 Spinal stenosis, lumbar region without neurogenic claudication: Secondary | ICD-10-CM | POA: Diagnosis not present

## 2019-11-02 DIAGNOSIS — M40204 Unspecified kyphosis, thoracic region: Secondary | ICD-10-CM | POA: Diagnosis not present

## 2019-11-05 ENCOUNTER — Other Ambulatory Visit: Payer: Self-pay | Admitting: Family Medicine

## 2019-11-05 DIAGNOSIS — N319 Neuromuscular dysfunction of bladder, unspecified: Secondary | ICD-10-CM | POA: Diagnosis not present

## 2019-11-05 DIAGNOSIS — M47816 Spondylosis without myelopathy or radiculopathy, lumbar region: Secondary | ICD-10-CM | POA: Diagnosis not present

## 2019-11-05 DIAGNOSIS — I081 Rheumatic disorders of both mitral and tricuspid valves: Secondary | ICD-10-CM | POA: Diagnosis not present

## 2019-11-05 DIAGNOSIS — L89894 Pressure ulcer of other site, stage 4: Secondary | ICD-10-CM | POA: Diagnosis not present

## 2019-11-05 DIAGNOSIS — M4316 Spondylolisthesis, lumbar region: Secondary | ICD-10-CM | POA: Diagnosis not present

## 2019-11-05 DIAGNOSIS — J9811 Atelectasis: Secondary | ICD-10-CM | POA: Diagnosis not present

## 2019-11-05 DIAGNOSIS — D72829 Elevated white blood cell count, unspecified: Secondary | ICD-10-CM | POA: Diagnosis not present

## 2019-11-05 DIAGNOSIS — M4604 Spinal enthesopathy, thoracic region: Secondary | ICD-10-CM | POA: Diagnosis not present

## 2019-11-05 DIAGNOSIS — M48061 Spinal stenosis, lumbar region without neurogenic claudication: Secondary | ICD-10-CM | POA: Diagnosis not present

## 2019-11-05 DIAGNOSIS — G4700 Insomnia, unspecified: Secondary | ICD-10-CM | POA: Diagnosis not present

## 2019-11-05 DIAGNOSIS — M4804 Spinal stenosis, thoracic region: Secondary | ICD-10-CM | POA: Diagnosis not present

## 2019-11-05 DIAGNOSIS — L89312 Pressure ulcer of right buttock, stage 2: Secondary | ICD-10-CM | POA: Diagnosis not present

## 2019-11-05 DIAGNOSIS — M40204 Unspecified kyphosis, thoracic region: Secondary | ICD-10-CM | POA: Diagnosis not present

## 2019-11-05 DIAGNOSIS — G834 Cauda equina syndrome: Secondary | ICD-10-CM | POA: Diagnosis not present

## 2019-11-05 DIAGNOSIS — M5144 Schmorl's nodes, thoracic region: Secondary | ICD-10-CM | POA: Diagnosis not present

## 2019-11-05 DIAGNOSIS — M4646 Discitis, unspecified, lumbar region: Secondary | ICD-10-CM | POA: Diagnosis not present

## 2019-11-06 DIAGNOSIS — L89894 Pressure ulcer of other site, stage 4: Secondary | ICD-10-CM | POA: Diagnosis not present

## 2019-11-06 DIAGNOSIS — M4804 Spinal stenosis, thoracic region: Secondary | ICD-10-CM | POA: Diagnosis not present

## 2019-11-06 DIAGNOSIS — I081 Rheumatic disorders of both mitral and tricuspid valves: Secondary | ICD-10-CM | POA: Diagnosis not present

## 2019-11-06 DIAGNOSIS — M40204 Unspecified kyphosis, thoracic region: Secondary | ICD-10-CM | POA: Diagnosis not present

## 2019-11-06 DIAGNOSIS — J9811 Atelectasis: Secondary | ICD-10-CM | POA: Diagnosis not present

## 2019-11-06 DIAGNOSIS — M48061 Spinal stenosis, lumbar region without neurogenic claudication: Secondary | ICD-10-CM | POA: Diagnosis not present

## 2019-11-06 DIAGNOSIS — M47816 Spondylosis without myelopathy or radiculopathy, lumbar region: Secondary | ICD-10-CM | POA: Diagnosis not present

## 2019-11-06 DIAGNOSIS — D72829 Elevated white blood cell count, unspecified: Secondary | ICD-10-CM | POA: Diagnosis not present

## 2019-11-06 DIAGNOSIS — L89312 Pressure ulcer of right buttock, stage 2: Secondary | ICD-10-CM | POA: Diagnosis not present

## 2019-11-06 DIAGNOSIS — M4646 Discitis, unspecified, lumbar region: Secondary | ICD-10-CM | POA: Diagnosis not present

## 2019-11-06 DIAGNOSIS — M4316 Spondylolisthesis, lumbar region: Secondary | ICD-10-CM | POA: Diagnosis not present

## 2019-11-06 DIAGNOSIS — N319 Neuromuscular dysfunction of bladder, unspecified: Secondary | ICD-10-CM | POA: Diagnosis not present

## 2019-11-06 DIAGNOSIS — G834 Cauda equina syndrome: Secondary | ICD-10-CM | POA: Diagnosis not present

## 2019-11-06 DIAGNOSIS — M5144 Schmorl's nodes, thoracic region: Secondary | ICD-10-CM | POA: Diagnosis not present

## 2019-11-06 DIAGNOSIS — M4604 Spinal enthesopathy, thoracic region: Secondary | ICD-10-CM | POA: Diagnosis not present

## 2019-11-06 DIAGNOSIS — G4700 Insomnia, unspecified: Secondary | ICD-10-CM | POA: Diagnosis not present

## 2019-11-07 DIAGNOSIS — L89894 Pressure ulcer of other site, stage 4: Secondary | ICD-10-CM | POA: Diagnosis not present

## 2019-11-07 DIAGNOSIS — M4316 Spondylolisthesis, lumbar region: Secondary | ICD-10-CM | POA: Diagnosis not present

## 2019-11-07 DIAGNOSIS — M4604 Spinal enthesopathy, thoracic region: Secondary | ICD-10-CM | POA: Diagnosis not present

## 2019-11-07 DIAGNOSIS — G4700 Insomnia, unspecified: Secondary | ICD-10-CM | POA: Diagnosis not present

## 2019-11-07 DIAGNOSIS — G834 Cauda equina syndrome: Secondary | ICD-10-CM | POA: Diagnosis not present

## 2019-11-07 DIAGNOSIS — I081 Rheumatic disorders of both mitral and tricuspid valves: Secondary | ICD-10-CM | POA: Diagnosis not present

## 2019-11-07 DIAGNOSIS — M4804 Spinal stenosis, thoracic region: Secondary | ICD-10-CM | POA: Diagnosis not present

## 2019-11-07 DIAGNOSIS — M48061 Spinal stenosis, lumbar region without neurogenic claudication: Secondary | ICD-10-CM | POA: Diagnosis not present

## 2019-11-07 DIAGNOSIS — M4646 Discitis, unspecified, lumbar region: Secondary | ICD-10-CM | POA: Diagnosis not present

## 2019-11-07 DIAGNOSIS — L89312 Pressure ulcer of right buttock, stage 2: Secondary | ICD-10-CM | POA: Diagnosis not present

## 2019-11-07 DIAGNOSIS — M5144 Schmorl's nodes, thoracic region: Secondary | ICD-10-CM | POA: Diagnosis not present

## 2019-11-07 DIAGNOSIS — D72829 Elevated white blood cell count, unspecified: Secondary | ICD-10-CM | POA: Diagnosis not present

## 2019-11-07 DIAGNOSIS — M47816 Spondylosis without myelopathy or radiculopathy, lumbar region: Secondary | ICD-10-CM | POA: Diagnosis not present

## 2019-11-07 DIAGNOSIS — M40204 Unspecified kyphosis, thoracic region: Secondary | ICD-10-CM | POA: Diagnosis not present

## 2019-11-07 DIAGNOSIS — J9811 Atelectasis: Secondary | ICD-10-CM | POA: Diagnosis not present

## 2019-11-07 DIAGNOSIS — N319 Neuromuscular dysfunction of bladder, unspecified: Secondary | ICD-10-CM | POA: Diagnosis not present

## 2019-11-07 NOTE — Telephone Encounter (Signed)
Rx was last refilled on 09/14/19 for #60 with 0 refills. Patient was last seen on 10/09/19 and has no upcoming appts. Dr. Einar Pheasant, is this ok to refill?

## 2019-11-09 ENCOUNTER — Ambulatory Visit
Admission: RE | Admit: 2019-11-09 | Discharge: 2019-11-09 | Disposition: A | Discharge status: Home / Self Care | Payer: BC Managed Care – PPO | Source: Ambulatory Visit | Attending: Internal Medicine | Admitting: Internal Medicine

## 2019-11-09 ENCOUNTER — Other Ambulatory Visit: Payer: Self-pay

## 2019-11-09 DIAGNOSIS — M48061 Spinal stenosis, lumbar region without neurogenic claudication: Secondary | ICD-10-CM | POA: Diagnosis not present

## 2019-11-09 DIAGNOSIS — M47816 Spondylosis without myelopathy or radiculopathy, lumbar region: Secondary | ICD-10-CM | POA: Diagnosis not present

## 2019-11-09 DIAGNOSIS — M4316 Spondylolisthesis, lumbar region: Secondary | ICD-10-CM | POA: Diagnosis not present

## 2019-11-09 DIAGNOSIS — M4646 Discitis, unspecified, lumbar region: Secondary | ICD-10-CM | POA: Diagnosis not present

## 2019-11-09 DIAGNOSIS — J9811 Atelectasis: Secondary | ICD-10-CM | POA: Diagnosis not present

## 2019-11-09 DIAGNOSIS — L89312 Pressure ulcer of right buttock, stage 2: Secondary | ICD-10-CM | POA: Diagnosis not present

## 2019-11-09 DIAGNOSIS — G4700 Insomnia, unspecified: Secondary | ICD-10-CM | POA: Diagnosis not present

## 2019-11-09 DIAGNOSIS — M5144 Schmorl's nodes, thoracic region: Secondary | ICD-10-CM | POA: Diagnosis not present

## 2019-11-09 DIAGNOSIS — N319 Neuromuscular dysfunction of bladder, unspecified: Secondary | ICD-10-CM | POA: Diagnosis not present

## 2019-11-09 DIAGNOSIS — L89894 Pressure ulcer of other site, stage 4: Secondary | ICD-10-CM | POA: Diagnosis not present

## 2019-11-09 DIAGNOSIS — I081 Rheumatic disorders of both mitral and tricuspid valves: Secondary | ICD-10-CM | POA: Diagnosis not present

## 2019-11-09 DIAGNOSIS — G834 Cauda equina syndrome: Secondary | ICD-10-CM | POA: Diagnosis not present

## 2019-11-09 DIAGNOSIS — M4604 Spinal enthesopathy, thoracic region: Secondary | ICD-10-CM | POA: Diagnosis not present

## 2019-11-09 DIAGNOSIS — M4804 Spinal stenosis, thoracic region: Secondary | ICD-10-CM | POA: Diagnosis not present

## 2019-11-09 DIAGNOSIS — D72829 Elevated white blood cell count, unspecified: Secondary | ICD-10-CM | POA: Diagnosis not present

## 2019-11-09 DIAGNOSIS — M5126 Other intervertebral disc displacement, lumbar region: Secondary | ICD-10-CM | POA: Diagnosis not present

## 2019-11-09 DIAGNOSIS — M40204 Unspecified kyphosis, thoracic region: Secondary | ICD-10-CM | POA: Diagnosis not present

## 2019-11-09 IMAGING — MR MR LUMBAR SPINE WO/W CM
6 of 7 series · 33 of 48 positions shown · IV contrast (gadavist)
Comparison: [DATE]

CLINICAL DATA: Low back pain. Left hip and thigh pain. History of
osteomyelitis and lumbar epidural abscess status treated with
decompression and IV antibiotics.

EXAM:
MRI LUMBAR SPINE WITHOUT AND WITH CONTRAST
TECHNIQUE: Multiplanar and multiecho pulse sequences of the lumbar spine were
obtained without and with intravenous contrast.
CONTRAST:  10mL GADAVIST GADOBUTROL 1 MMOL/ML IV SOLN

[Series 5: T2 · sagittal · 4.0mm · 1.02mm/px · 6 of 16 slices shown (1 of 2)]
[im 1/16]
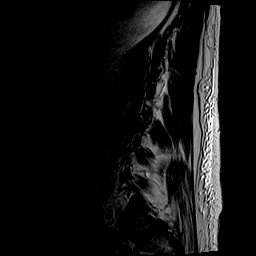
[im 4/16]
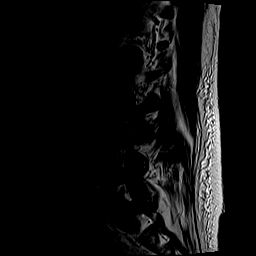
[im 7/16]
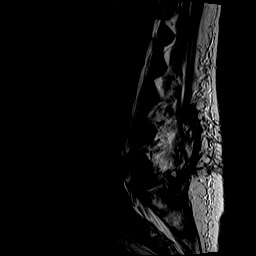
[im 10/16]
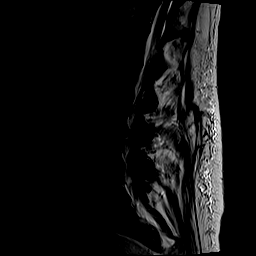
[im 13/16]
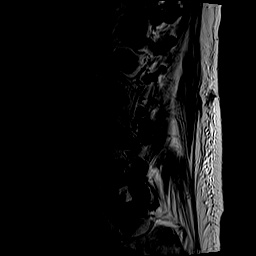
[im 16/16]
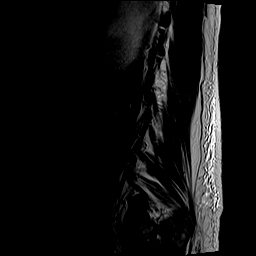

[Series 6: T1 · sagittal · 4.0mm · 1.02mm/px · 4 of 15 slices shown (1 of 2)]
[im 1/15]
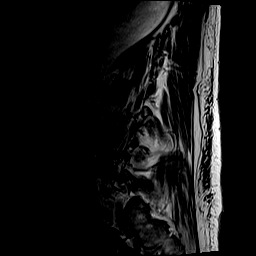
[im 5/15]
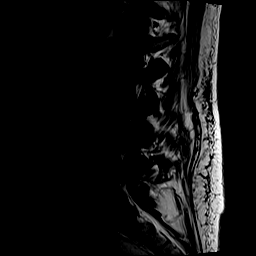
[im 10/15]
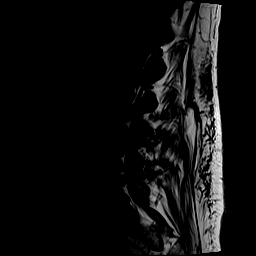
[im 15/15]
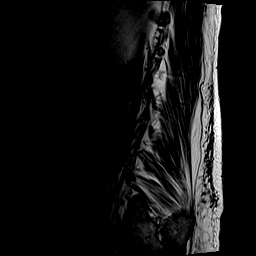

[Series 7: STIR · sagittal · 4.0mm · 0.51mm/px · 3 of 15 slices shown]
[im 1/15]
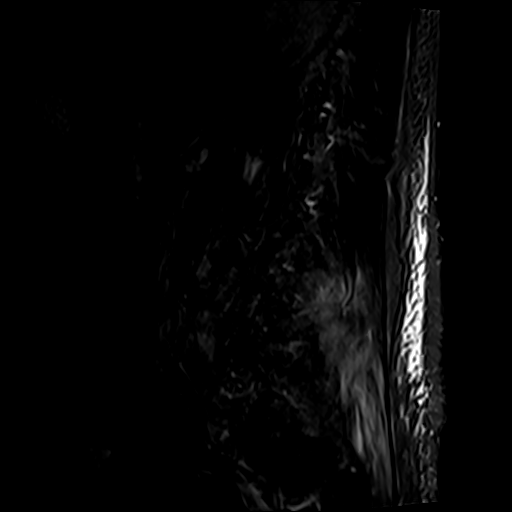
[im 5/15]
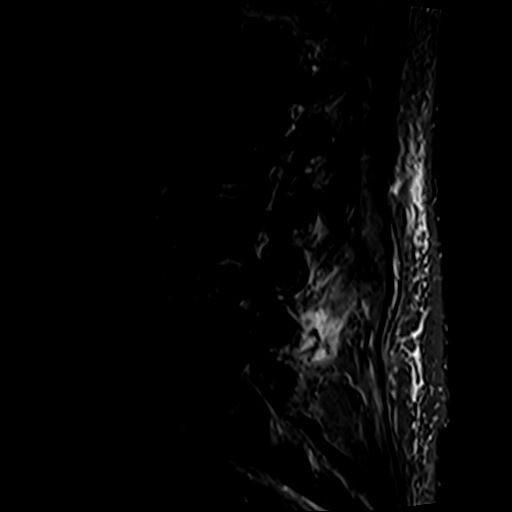
[im 10/15]
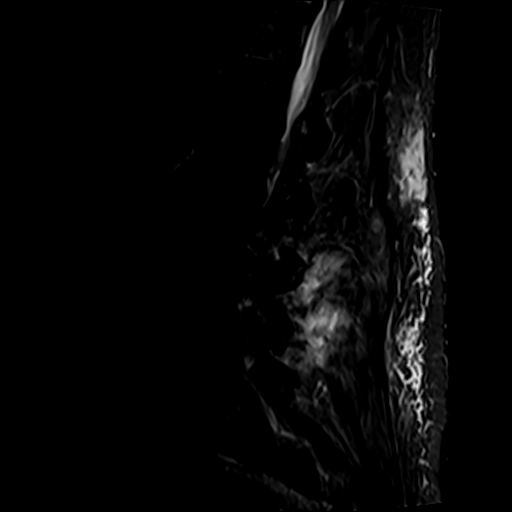

[Series 8: T2 · axial · 4.0mm · 0.78mm/px · z∈[-130,+101]mm · 8 of 35 slices shown (2 of 2)]
[im 1/35]
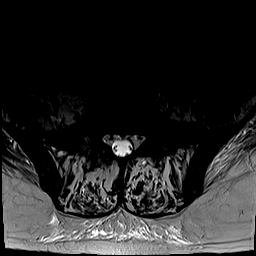
[im 4/35]
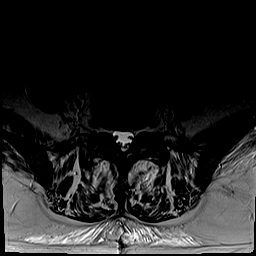
[im 12/35]
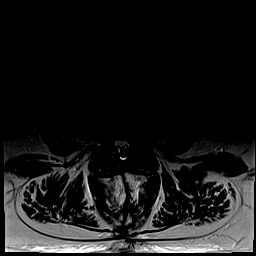
[im 16/35]
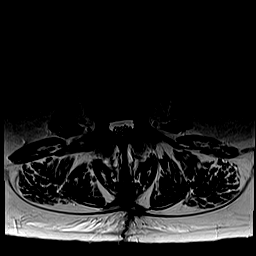
[im 19/35]
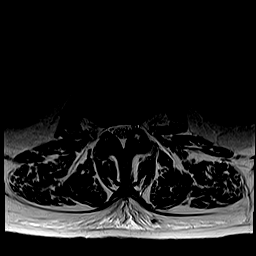
[im 23/35]
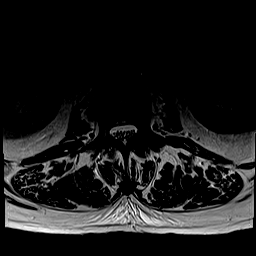
[im 31/35]
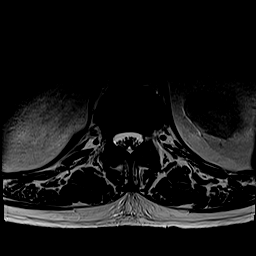
[im 35/35]
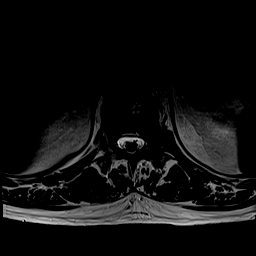

[Series 9: T1 · axial · 4.0mm · 0.39mm/px · z∈[-130,+101]mm · 8 of 35 slices shown (2 of 2)]
[im 1/35]
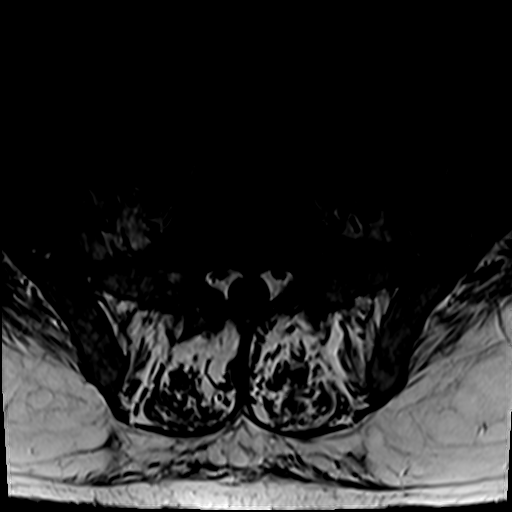
[im 4/35]
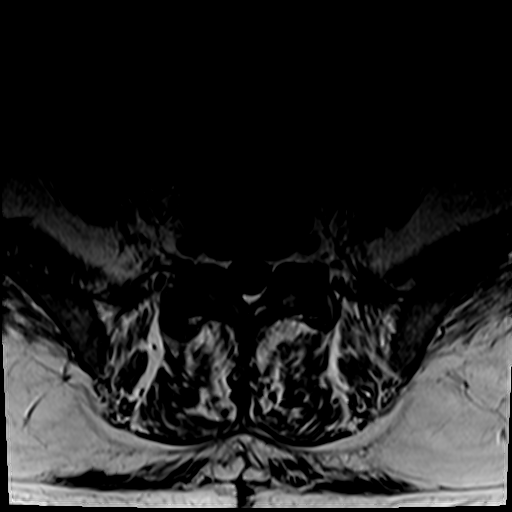
[im 12/35]
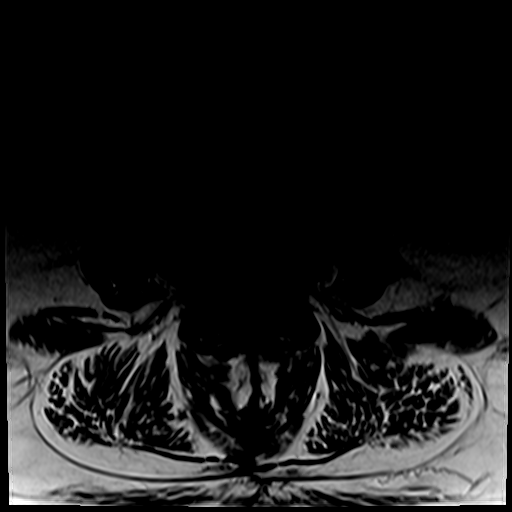
[im 16/35]
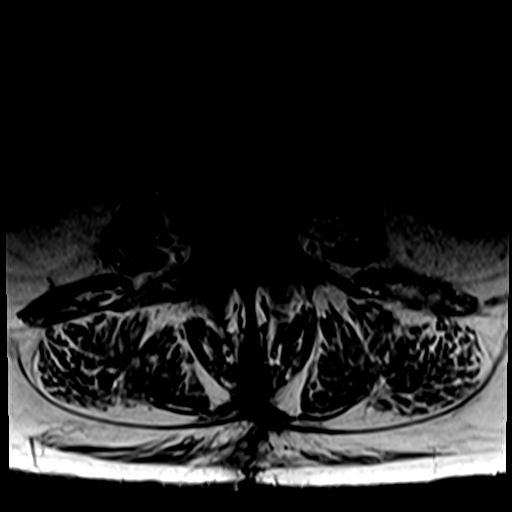
[im 19/35]
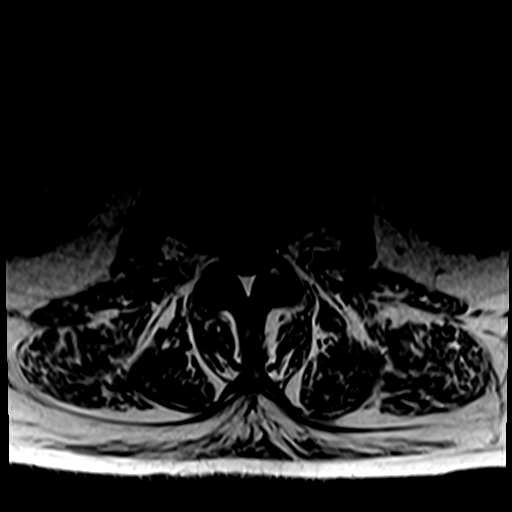
[im 23/35]
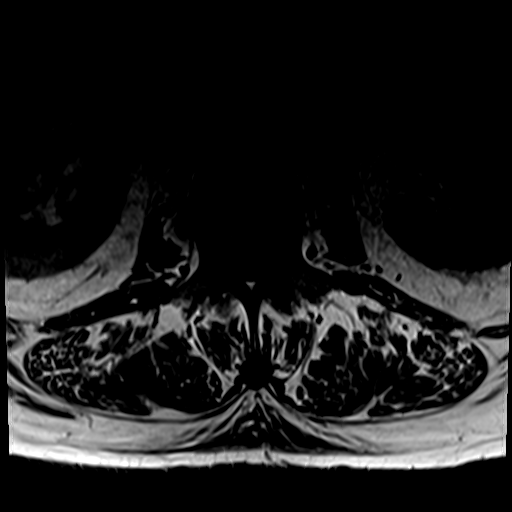
[im 31/35]
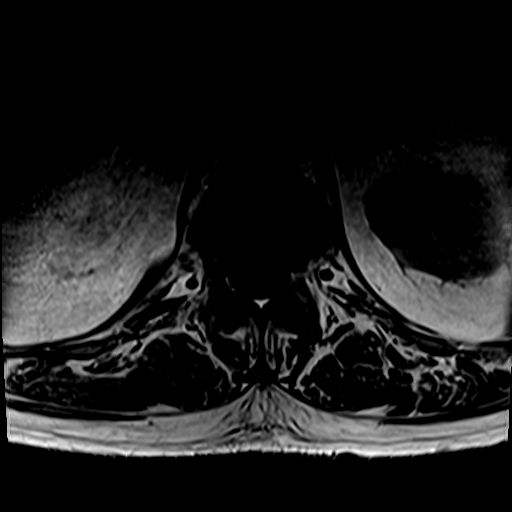
[im 35/35]
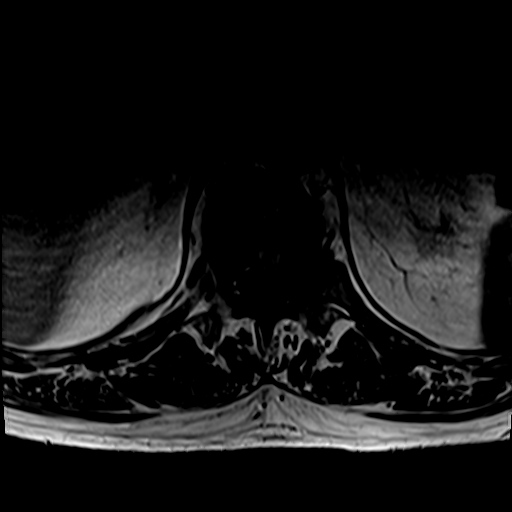

[Series 10: T1 fat-sat post-contrast · sagittal · 4.0mm · 1.02mm/px · 4 of 15 slices shown]
[im 1/15]
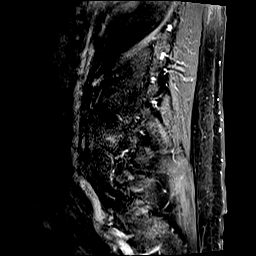
[im 5/15]
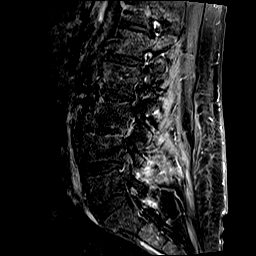
[im 10/15]
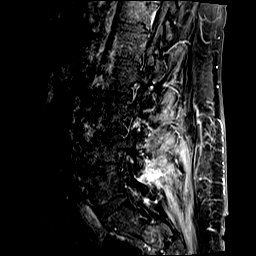
[im 15/15]
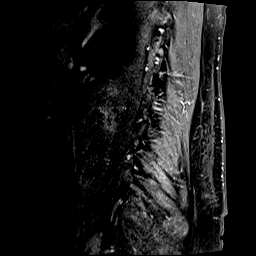

[33 of 48 positions shown; findings below may reference images not displayed]

FINDINGS: Segmentation:  Standard.

Alignment:  Chronic trace retrolisthesis of L3 on L4 and L4 on L5.

Vertebrae: Asymmetric right-sided marrow edema superiorly in the L4
vertebral body, decreased from the prior study. Increased, mild
edema along the L3 inferior endplate. New moderate L3-4 disc space
height loss with focal T2 hyperintensity in the right lateral aspect
of the disc. No gross endplate destruction. Low level facet edema on
the right at L5-S1, likely degenerative.

Conus medullaris and cauda equina: Conus extends to the L1 level.
Conus and cauda equina appear normal.

Paraspinal and other soft tissues: Postoperative changes in the
posterior soft tissues at L3-4. No paraspinal or epidural fluid
collection. Resolved psoas abscesses.

Disc levels:

T12-L1: Negative.

L1-2: Mild disc bulging and mild facet hypertrophy without stenosis,
unchanged.

L2-3: Mild disc bulging and mild facet hypertrophy without
significant stenosis, unchanged.

L3-4: Interval posterior decompression. Circumferential disc
osteophyte complex, disc space height loss, and moderate facet
hypertrophy result in borderline to mild bilateral lateral recess
stenosis and borderline bilateral neural foraminal stenosis without
spinal stenosis.

L4-5: Interval posterior decompression. Mild circumferential disc
osteophyte complex and moderate facet hypertrophy result in
borderline to mild bilateral lateral recess stenosis and borderline
to mild bilateral neural foraminal stenosis without spinal stenosis.

L5-S1: Severe right and moderate left facet hypertrophy without disc
herniation or stenosis.
IMPRESSION: 1. Overall improved appearance of lumbar spine infection with
decreased L4 vertebral marrow edema. Progressive L3-4 disc space
narrowing and mild L3 inferior endplate edema may reflect the
sequelae of previous infection and progressive disc degeneration
although residual active infection is not excluded.
2. Interval posterior decompression at L3-4 and L4-5 without
residual/recurrent epidural abscess or spinal stenosis.
3. Resolved psoas abscesses.
4. Lumbar disc and facet degeneration as above without compressive
stenosis.

## 2019-11-09 MED ORDER — GADOBUTROL 1 MMOL/ML IV SOLN
10.0000 mL | Freq: Once | INTRAVENOUS | Status: AC | PRN
  Administered 2019-11-09: 10 mL via INTRAVENOUS

## 2019-11-12 ENCOUNTER — Telehealth: Payer: Self-pay | Admitting: *Deleted

## 2019-11-12 DIAGNOSIS — L89894 Pressure ulcer of other site, stage 4: Secondary | ICD-10-CM | POA: Diagnosis not present

## 2019-11-12 DIAGNOSIS — N319 Neuromuscular dysfunction of bladder, unspecified: Secondary | ICD-10-CM | POA: Diagnosis not present

## 2019-11-12 DIAGNOSIS — M4804 Spinal stenosis, thoracic region: Secondary | ICD-10-CM | POA: Diagnosis not present

## 2019-11-12 DIAGNOSIS — M40204 Unspecified kyphosis, thoracic region: Secondary | ICD-10-CM | POA: Diagnosis not present

## 2019-11-12 DIAGNOSIS — M4316 Spondylolisthesis, lumbar region: Secondary | ICD-10-CM | POA: Diagnosis not present

## 2019-11-12 DIAGNOSIS — M47816 Spondylosis without myelopathy or radiculopathy, lumbar region: Secondary | ICD-10-CM | POA: Diagnosis not present

## 2019-11-12 DIAGNOSIS — M48061 Spinal stenosis, lumbar region without neurogenic claudication: Secondary | ICD-10-CM | POA: Diagnosis not present

## 2019-11-12 DIAGNOSIS — I081 Rheumatic disorders of both mitral and tricuspid valves: Secondary | ICD-10-CM | POA: Diagnosis not present

## 2019-11-12 DIAGNOSIS — D72829 Elevated white blood cell count, unspecified: Secondary | ICD-10-CM | POA: Diagnosis not present

## 2019-11-12 DIAGNOSIS — M4604 Spinal enthesopathy, thoracic region: Secondary | ICD-10-CM | POA: Diagnosis not present

## 2019-11-12 DIAGNOSIS — G834 Cauda equina syndrome: Secondary | ICD-10-CM | POA: Diagnosis not present

## 2019-11-12 DIAGNOSIS — M5144 Schmorl's nodes, thoracic region: Secondary | ICD-10-CM | POA: Diagnosis not present

## 2019-11-12 DIAGNOSIS — J9811 Atelectasis: Secondary | ICD-10-CM | POA: Diagnosis not present

## 2019-11-12 DIAGNOSIS — M4646 Discitis, unspecified, lumbar region: Secondary | ICD-10-CM | POA: Diagnosis not present

## 2019-11-12 DIAGNOSIS — G4700 Insomnia, unspecified: Secondary | ICD-10-CM | POA: Diagnosis not present

## 2019-11-12 DIAGNOSIS — L89312 Pressure ulcer of right buttock, stage 2: Secondary | ICD-10-CM | POA: Diagnosis not present

## 2019-11-12 NOTE — Telephone Encounter (Signed)
Diane nurse with [facility] called stating that she has been seeing the patient for 4 months for wound vac care. Diane stated that patient is still having problems with the wound and is not walking well. Diane stated that the patient is depressed and she is wondering if Dr. Einar Pheasant will prescribe an antidepressant medication for the patient. Diane stated that the patient has told her that he is at the point that he needs a medication for depression.  Pharmacy [facility] [address]

## 2019-11-13 DIAGNOSIS — D72829 Elevated white blood cell count, unspecified: Secondary | ICD-10-CM | POA: Diagnosis not present

## 2019-11-13 DIAGNOSIS — M4316 Spondylolisthesis, lumbar region: Secondary | ICD-10-CM | POA: Diagnosis not present

## 2019-11-13 DIAGNOSIS — M48061 Spinal stenosis, lumbar region without neurogenic claudication: Secondary | ICD-10-CM | POA: Diagnosis not present

## 2019-11-13 DIAGNOSIS — N319 Neuromuscular dysfunction of bladder, unspecified: Secondary | ICD-10-CM | POA: Diagnosis not present

## 2019-11-13 DIAGNOSIS — M40204 Unspecified kyphosis, thoracic region: Secondary | ICD-10-CM | POA: Diagnosis not present

## 2019-11-13 DIAGNOSIS — M4604 Spinal enthesopathy, thoracic region: Secondary | ICD-10-CM | POA: Diagnosis not present

## 2019-11-13 DIAGNOSIS — M4646 Discitis, unspecified, lumbar region: Secondary | ICD-10-CM | POA: Diagnosis not present

## 2019-11-13 DIAGNOSIS — L89894 Pressure ulcer of other site, stage 4: Secondary | ICD-10-CM | POA: Diagnosis not present

## 2019-11-13 DIAGNOSIS — M4804 Spinal stenosis, thoracic region: Secondary | ICD-10-CM | POA: Diagnosis not present

## 2019-11-13 DIAGNOSIS — G4700 Insomnia, unspecified: Secondary | ICD-10-CM | POA: Diagnosis not present

## 2019-11-13 DIAGNOSIS — M47816 Spondylosis without myelopathy or radiculopathy, lumbar region: Secondary | ICD-10-CM | POA: Diagnosis not present

## 2019-11-13 DIAGNOSIS — J9811 Atelectasis: Secondary | ICD-10-CM | POA: Diagnosis not present

## 2019-11-13 DIAGNOSIS — L89312 Pressure ulcer of right buttock, stage 2: Secondary | ICD-10-CM | POA: Diagnosis not present

## 2019-11-13 DIAGNOSIS — I081 Rheumatic disorders of both mitral and tricuspid valves: Secondary | ICD-10-CM | POA: Diagnosis not present

## 2019-11-13 DIAGNOSIS — M5144 Schmorl's nodes, thoracic region: Secondary | ICD-10-CM | POA: Diagnosis not present

## 2019-11-13 DIAGNOSIS — G834 Cauda equina syndrome: Secondary | ICD-10-CM | POA: Diagnosis not present

## 2019-11-13 NOTE — Telephone Encounter (Signed)
Spoke with Diane and advised of Dr [facility].  Called patient but voicemail was not set up. Will try again later today.

## 2019-11-13 NOTE — Telephone Encounter (Signed)
I would be happy to talk with him about his mood. I recommend he make an appointment to discuss options. Virtual is ok.

## 2019-11-13 NOTE — Telephone Encounter (Signed)
Spoke with lauren, patient's daughter-ok per DPR on file, scheduled virtual visit on 11/15/19

## 2019-11-14 ENCOUNTER — Other Ambulatory Visit: Payer: Self-pay

## 2019-11-14 ENCOUNTER — Encounter: Payer: Self-pay | Admitting: Internal Medicine

## 2019-11-14 ENCOUNTER — Encounter: Payer: BC Managed Care – PPO | Admitting: Internal Medicine

## 2019-11-14 ENCOUNTER — Ambulatory Visit (INDEPENDENT_AMBULATORY_CARE_PROVIDER_SITE_OTHER): Payer: BC Managed Care – PPO | Admitting: Internal Medicine

## 2019-11-14 DIAGNOSIS — L0231 Cutaneous abscess of buttock: Secondary | ICD-10-CM | POA: Diagnosis not present

## 2019-11-14 DIAGNOSIS — T8131XA Disruption of external operation (surgical) wound, not elsewhere classified, initial encounter: Secondary | ICD-10-CM | POA: Diagnosis not present

## 2019-11-14 DIAGNOSIS — T8131XD Disruption of external operation (surgical) wound, not elsewhere classified, subsequent encounter: Secondary | ICD-10-CM | POA: Diagnosis not present

## 2019-11-14 DIAGNOSIS — L89154 Pressure ulcer of sacral region, stage 4: Secondary | ICD-10-CM | POA: Diagnosis not present

## 2019-11-14 DIAGNOSIS — X58XXXD Exposure to other specified factors, subsequent encounter: Secondary | ICD-10-CM | POA: Diagnosis not present

## 2019-11-14 DIAGNOSIS — M4646 Discitis, unspecified, lumbar region: Secondary | ICD-10-CM

## 2019-11-14 NOTE — Assessment & Plan Note (Signed)
I believe his lumbar infection has been cured.  He can follow-up here as needed.

## 2019-11-14 NOTE — Progress Notes (Signed)
[facility] for Infectious Disease  Patient Active Problem List   Diagnosis Date Noted  . Psoas abscess, right ([place]) 08/20/2019    Priority: High  . Cauda equina syndrome ([place]) 07/31/2019    Priority: High  . Lumbar discitis 07/30/2019    Priority: High  . S/P lumbar laminectomy 07/25/2019    Priority: High  . H/O MSSA epidural abscess, L2-L5 07/25/2019    Priority: High  . Left hip pain 10/09/2019  . Blurry vision, left eye 10/09/2019  . Infected pressure ulcer 08/20/2019  . Hyponatremia 08/20/2019  . Pressure injury of skin 08/02/2019  . 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 Anemia 07/18/2019  . Obesity (BMI 35.0-39.9 without comorbidity) 07/18/2019  . Constipation 07/18/2019  . Elevated blood sugar 04/01/2017  . Avascular necrosis of bone of hip, left ([place]) 10/12/2016  . Alcohol abuse, in remission 10/12/2016  . S/P total hip arthroplasty 10/12/2016    Patient's Medications  New Prescriptions   No medications on file  Previous Medications   ACETAMINOPHEN (TYLENOL) 325 MG TABLET    Take 2 tablets (650 mg total) by mouth every 4 (four) hours as needed for mild pain ((score 1 to 3) or temp > 100.5).   CYCLOBENZAPRINE (FLEXERIL) 10 MG TABLET    TAKE 1 TABLET BY MOUTH THREE TIMES A DAY AS NEEDED FOR MUSCLE SPASMS   OXYCODONE HCL 10 MG TABS    Take 1 tablet (10 mg total) by mouth every 4 (four) hours as needed (For pain).   OXYCODONE HCL 10 MG TABS    Take 1 tablet (10 mg total) by mouth 3 (three) times daily as needed.  Modified Medications   No medications on file  Discontinued Medications   No medications on file    Subjective: Mr. [name] is in with his daughter for his routine follow-up visit.  He was hospitalized in December with MSSA bacteremia complicated by a lumbar infection and cauda equina syndrome.  He underwent lumbar decompression.  There was concerns about the possibility of CNS emboli although there was never any clear evidence of  endocarditis by exam or TTE.  TEE was not pursued because it was felt that this would not change his management or outcome.  He received 4 weeks of IV nafcillin before switching to ampicillin sulbactam.  The switch was made because he had developed a sacral pressure sore that appeared infected.  He completed 54 days of IV antibiotic therapy on 09/17/2019.    His back pain was improving slowly.  About 2 months ago his left foot slipped off a stool while he was taking a shower.  As soon as his foot hit the floor he had shooting pain in his left hip and thigh.  He has had severe, intermittent pain since that time.  He had a similar episode about a week later when he was resting his left foot on the side of the bathtub while he was toweling off.  His foot slipped again, hitting the floor and causing severe pain.  The pain in his left hip and thigh is worse at night.  This pain is unlike the pain he had in his lower back when he went into the hospital in December.  That acute on chronic pain has improved.  His sacral wound is improving.  He is still having a lot of pain around his open wound.  He saw [name] at the wound center this morning who noted significant  improvement and removed his wound VAC dressing.  He underwent a repeat lumbar MRI on 11/10/2019 which showed:  IMPRESSION: 1. Overall improved appearance of lumbar spine infection with decreased L4 vertebral marrow edema. Progressive L3-4 disc space narrowing and mild L3 inferior endplate edema may reflect the sequelae of previous infection and progressive disc degeneration although residual active infection is not excluded. 2. Interval posterior decompression at L3-4 and L4-5 without residual/recurrent epidural abscess or spinal stenosis. 3. Resolved psoas abscesses. 4. Lumbar disc and facet degeneration as above without compressive stenosis.  Review of Systems: Review of Systems  Constitutional: Positive for malaise/fatigue. Negative for  fever and weight loss.  Respiratory: Negative for cough and shortness of breath.   Cardiovascular: Negative for chest pain.  Gastrointestinal: Positive for constipation. Negative for abdominal pain, diarrhea, nausea and vomiting.  Musculoskeletal: Positive for back pain.  Skin: Negative for rash.  Neurological: Negative for focal weakness.    Past Medical History:  Diagnosis Date  . History of chicken pox   . Medical history non-contributory     Social History   Tobacco Use  . Smoking status: Former Smoker    Packs/day: 1.00    Years: 14.00    Pack years: 14.00    Types: Cigarettes    Quit date: 07/27/1999    Years since quitting: 20.3  . Smokeless tobacco: Never Used  Substance Use Topics  . Alcohol use: Yes    Alcohol/week: 24.0 standard drinks    Types: 24 Cans of beer per week    Comment: 4-5 beers per day, case of beer per week  . Drug use: No    Family History  Problem Relation Age of Onset  . Cancer Father        Hodgkin's disease  . COPD Mother   . Heart attack Maternal Grandfather 80  . Diabetes Neg Hx   . Stroke Neg Hx   . Hypertension Neg Hx   . Hyperlipidemia Neg Hx     Allergies  Allergen Reactions  . Bee Venom Anaphylaxis  . Hydrocodone Nausea And Vomiting    Objective: Vitals:   11/14/19 1344  BP: 111/69  Pulse: 90  Weight: 219 lb (99.3 kg)   Body mass index is 29.7 kg/m.  Physical Exam Constitutional:      Comments: He appears uncomfortable due to pain.  He is accompanied by his daughter.  Cardiovascular:     Rate and Rhythm: Normal rate and regular rhythm.     Heart sounds: No murmur.  Pulmonary:     Effort: Pulmonary effort is normal.     Breath sounds: Normal breath sounds.  Skin:    Findings: No rash.  Neurological:     Comments: He walks with the aid of a walker.     Sed Rate  Date Value  10/09/2019 87 mm/hr (H)  09/03/2019 >130 mm/h (H)  08/13/2019 >140 mm/hr (H)   CRP  Date Value  09/03/2019 68.5 mg/L (H)    08/13/2019 18.3 mg/dL (H)  08/06/2019 8.8 mg/dL (H)      Problem List Items Addressed This Visit      High   Lumbar discitis    I believe his lumbar infection has been cured.  He can follow-up here as needed.          Michel Bickers, MD [facility] [phone] pager   [phone] cell 11/14/2019, 2:04 PM

## 2019-11-15 ENCOUNTER — Telehealth: Payer: BC Managed Care – PPO | Admitting: Family Medicine

## 2019-11-15 NOTE — Progress Notes (Signed)
Novick, ANDR… BRADING ([id number]) Visit Report for 11/14/2019 Arrival Information Details Patient Name: [name], [name] Date of Service: 11/14/2019 9:30 AM Medical Record Number: [id number] Patient Account Number: [id number] Date of Birth/Sex: January 22, 1966 (54 y.o. M) Treating [name]: [name] Primary Care [name]: [name] Other Clinician: Referring [name]: [name] Treating [name]/Extender: [name] in Treatment: 10 Visit Information History Since Last Visit Added or deleted any medications: No Patient Arrived: Cane Any new allergies or adverse reactions: No Arrival Time: 09:33 Had a fall or experienced change in No Accompanied By: daughter activities of daily living that may affect Transfer Assistance: None risk of falls: Patient Identification Verified: Yes Signs or symptoms of abuse/neglect since last visito No Secondary Verification Process Completed: Yes Hospitalized since last visit: No Implantable device outside of the clinic excluding No cellular tissue based products placed in the center since last visit: Has Dressing in Place as Prescribed: Yes Pain Present Now: No Electronic Signature(s) Signed: 11/14/2019 4:33:58 PM By: [name] Entered By: [name] on 11/14/2019 09:34:31 [name] ([id number]) -------------------------------------------------------------------------------- Clinic Level of Care Assessment Details Patient Name: [name] Date of Service: 11/14/2019 9:30 AM Medical Record Number: [id number] Patient Account Number: [id number] Date of Birth/Sex: [birth year] (54 y.o. M) Treating [name]: [name] Primary Care Angeliz Settlemyre: [name] Other Clinician: Referring [name]: [name] Treating [name]/Extender: [name] in Treatment: 10 Clinic Level of Care Assessment Items TOOL 4 Quantity Score []  - Use when only an EandM is performed on FOLLOW-UP visit 0 ASSESSMENTS - Nursing Assessment /  Reassessment X - Reassessment of Co-morbidities (includes updates in patient status) 1 10 X- 1 5 Reassessment of Adherence to Treatment Plan ASSESSMENTS - Wound and Skin Assessment / Reassessment X - Simple Wound Assessment / Reassessment - one wound 1 5 []  - 0 Complex Wound Assessment / Reassessment - multiple wounds []  - 0 Dermatologic / Skin Assessment (not related to wound area) ASSESSMENTS - Focused Assessment []  - Circumferential Edema Measurements - multi extremities 0 []  - 0 Nutritional Assessment / Counseling / Intervention []  - 0 Lower Extremity Assessment (monofilament, tuning fork, pulses) []  - 0 Peripheral Arterial Disease Assessment (using hand held doppler) ASSESSMENTS - Ostomy and/or Continence Assessment and Care []  - Incontinence Assessment and Management 0 []  - 0 Ostomy Care Assessment and Management (repouching, etc.) PROCESS - Coordination of Care X - Simple Patient / Family Education for ongoing care 1 15 []  - 0 Complex (extensive) Patient / Family Education for ongoing care X- 1 10 Staff obtains Programmer, systems, Records, Test Results / Process Orders []  - 0 Staff telephones HHA, Nursing Homes / Clarify orders / etc []  - 0 Routine Transfer to another Facility (non-emergent condition) []  - 0 Routine Hospital Admission (non-emergent condition) []  - 0 New Admissions / Biomedical engineer / Ordering NPWT, Apligraf, etc. []  - 0 Emergency Hospital Admission (emergent condition) X- 1 10 Simple Discharge Coordination []  - 0 Complex (extensive) Discharge Coordination PROCESS - Special Needs []  - Pediatric / Minor Patient Management 0 []  - 0 Isolation Patient Management []  - 0 Hearing / Language / Visual special needs []  - 0 Assessment of Community assistance (transportation, D/C planning, etc.) [name], [name] ([id number]) []  - 0 Additional assistance / Altered mentation []  - 0 Support Surface(s) Assessment (bed, cushion, seat, etc.) INTERVENTIONS -  Wound Cleansing / Measurement X - Simple Wound Cleansing - one wound 1 5 []  - 0 Complex Wound Cleansing - multiple wounds X- 1 5 Wound Imaging (  photographs - any number of wounds) []  - 0 Wound Tracing (instead of photographs) X- 1 5 Simple Wound Measurement - one wound []  - 0 Complex Wound Measurement - multiple wounds INTERVENTIONS - Wound Dressings []  - Small Wound Dressing one or multiple wounds 0 []  - 0 Medium Wound Dressing one or multiple wounds X- 1 20 Large Wound Dressing one or multiple wounds []  - 0 Application of Medications - topical []  - 0 Application of Medications - injection INTERVENTIONS - Miscellaneous []  - External ear exam 0 []  - 0 Specimen Collection (cultures, biopsies, blood, body fluids, etc.) []  - 0 Specimen(s) / Culture(s) sent or taken to Lab for analysis []  - 0 Patient Transfer (multiple staff / Civil Service fast streamer / Similar devices) []  - 0 Simple Staple / Suture removal (25 or less) []  - 0 Complex Staple / Suture removal (26 or more) []  - 0 Hypo / Hyperglycemic Management (close monitor of Blood Glucose) []  - 0 Ankle / Brachial Index (ABI) - do not check if billed separately X- 1 5 Vital Signs Has the patient been seen at the hospital within the last three years: Yes Total Score: 95 Level Of Care: New/Established - Level 3 Electronic Signature(s) Signed: 11/15/2019 5:10:21 PM By: [name] Entered By: [name], BSN, [name], CWS, [name] on 11/14/2019 09:59:35 [name] ([id number]) -------------------------------------------------------------------------------- Encounter Discharge Information Details Patient Name: [name], [name] Date of Service: 11/14/2019 9:30 AM Medical Record Number: [id number] Patient Account Number: [id number] Date of Birth/Sex: Aug 18, 1965 (54 y.o. M) Treating [name]: [name] Primary Care Elwin Tsou: [name] Other Clinician: Referring Anita Mcadory: [name] Treating Jaydon Soroka/Extender: [name] in Treatment: 10 Encounter Discharge Information Items Discharge Condition: Stable Ambulatory Status: Ambulatory Discharge Destination: Home Transportation: Private Auto Accompanied By: daughter Schedule Follow-up Appointment: Yes Clinical Summary of Care: Electronic Signature(s) Signed: 11/15/2019 5:10:21 PM By: [name] Entered By: [name], BSN, [name], CWS, [name] on 11/14/2019 10:01:25 Cahalan, [name] ([id number]) -------------------------------------------------------------------------------- Lower Extremity Assessment Details Patient Name: Hageman, [name] Date of Service: 11/14/2019 9:30 AM Medical Record Number: [id number] Patient Account Number: [id number] Date of Birth/Sex: August 27, 1965 (54 y.o. M) Treating [name]: [name] Primary Care Abuk Selleck: [name] Other Clinician: Referring Elick Aguilera: [name] Treating Zea Kostka/Extender: Ricard Dillon Weeks in Treatment: 10 Electronic Signature(s) Signed: 11/14/2019 4:33:58 PM By: [name] Entered By: [name] on 11/14/2019 09:34:40 Mostek, [name] ([id number]) -------------------------------------------------------------------------------- Multi Wound Chart Details Patient Name: Legore, [name] Date of Service: 11/14/2019 9:30 AM Medical Record Number: [id number] Patient Account Number: [id number] Date of Birth/Sex: Dec 27, 1965 (54 y.o. M) Treating [name]: [name] Primary Care Bellamie Turney: [name] Other Clinician: Referring Lennon Richins: [name] Treating Amani Marseille/Extender: [name] in Treatment: 10 Vital Signs Height(in): 72 Pulse(bpm): 84 Weight(lbs): 230 Blood Pressure(mmHg): 113/68 Body Mass Index(BMI): 31 Temperature(F): 98.3 Respiratory Rate(breaths/min): 16 Photos: [N/A:N/A] Wound Location: Midline Sacrum N/A N/A Wounding Event: Gradually Appeared N/A N/A Primary Etiology: Abscess N/A N/A Date Acquired: 07/24/2019 N/A N/A Weeks of Treatment: 10 N/A  N/A Wound Status: Open N/A N/A Measurements L x W x D (cm) 1.2x2.3x2.5 N/A N/A Area (cm) : 2.168 N/A N/A Volume (cm) : 5.419 N/A N/A % Reduction in Area: 73.80% N/A N/A % Reduction in Volume: 85.50% N/A N/A Classification: Full Thickness With Exposed N/A N/A Support Structures Exudate Amount: Medium N/A N/A Exudate Type: Serosanguineous N/A N/A Exudate Color: red, brown N/A N/A Foul Odor After Cleansing: Yes N/A N/A Odor  Anticipated Due to Product No N/A N/A Use: Wound Margin: Thickened N/A N/A Granulation Amount: Large (67-100%) N/A N/A Granulation Quality: Red N/A N/A Necrotic Amount: Small (1-33%) N/A N/A Exposed Structures: Fat Layer (Subcutaneous Tissue) N/A N/A Exposed: Yes Fascia: No Tendon: No Muscle: No Joint: No Bone: No Epithelialization: None N/A N/A Treatment Notes Wound #1 (Midline Sacrum) Notes Silver alginate packed lightly into entire open space, secured with bordered foam dressing. Woodlief, [name] ([id number]) Electronic Signature(s) Signed: 11/15/2019 7:42:01 AM By: Linton Ham MD Entered By: Linton Ham on 11/14/2019 10:08:14 Sheckler, [name] ([id number]) -------------------------------------------------------------------------------- [place] Details Patient Name: Cutshaw, [name] SHILTZ. Date of Service: 11/14/2019 9:30 AM Medical Record Number: [id number] Patient Account Number: [id number] Date of Birth/Sex: 1965/12/20 (54 y.o. M) Treating [name]: [name] Primary Care Kourtlynn Trevor: [name] Other Clinician: Referring Keerthi Hazell: [name] Treating Carleah Yablonski/Extender: [name] in Treatment: 10 Active Inactive Necrotic Tissue Nursing Diagnoses: Impaired tissue integrity related to necrotic/devitalized tissue Goals: Necrotic/devitalized tissue will be minimized in the wound bed Date Initiated: 09/05/2019 Target Resolution Date: 09/26/2019 Goal Status: Active Interventions: Assess patient pain level pre-, during  and post procedure and prior to discharge Treatment Activities: Apply topical anesthetic as ordered : 09/05/2019 Notes: Orientation to the Wound Care Program Nursing Diagnoses: Knowledge deficit related to the wound healing center program Goals: Patient/caregiver will verbalize understanding of the [place] Date Initiated: 09/05/2019 Target Resolution Date: 09/26/2019 Goal Status: Active Interventions: Provide education on orientation to the wound center Notes: Pressure Nursing Diagnoses: Knowledge deficit related to management of pressures ulcers Goals: Patient will remain free from development of additional pressure ulcers Date Initiated: 09/05/2019 Target Resolution Date: 10/03/2019 Goal Status: Active Patient/caregiver will verbalize risk factors for pressure ulcer development Date Initiated: 09/05/2019 Target Resolution Date: 09/26/2019 Goal Status: Active Patient/caregiver will verbalize understanding of pressure ulcer management Date Initiated: 09/05/2019 Target Resolution Date: 10/03/2019 Goal Status: Active Interventions: Jepsen, [name] ([id number]) Assess: immobility, friction, shearing, incontinence upon admission and as needed Provide education on pressure ulcers Notes: Wound/Skin Impairment Nursing Diagnoses: Impaired tissue integrity Goals: Patient/caregiver will verbalize understanding of skin care regimen Date Initiated: 09/05/2019 Target Resolution Date: 09/26/2019 Goal Status: Active Ulcer/skin breakdown will have a volume reduction of 30% by week 4 Date Initiated: 09/05/2019 Target Resolution Date: 10/03/2019 Goal Status: Active Interventions: Assess ulceration(s) every visit Treatment Activities: Referred to DME Rozell Theiler for dressing supplies : 09/05/2019 Notes: Electronic Signature(s) Signed: 11/15/2019 5:10:21 PM By: [name] Entered By: [name], BSN, [name], CWS, [name] on 11/14/2019 09:51:29 Lemmons, [name]  ([id number]) -------------------------------------------------------------------------------- Pain Assessment Details Patient Name: Sansone, [name] Date of Service: 11/14/2019 9:30 AM Medical Record Number: [id number] Patient Account Number: [id number] Date of Birth/Sex: 27-Sep-1965 (54 y.o. M) Treating [name]: [name] Primary Care Lakrisha Iseman: [name] Other Clinician: Referring Alley Neils: [name] Treating Dametri Ozburn/Extender: [name] in Treatment: 10 Active Problems Location of Pain Severity and Description of Pain Patient Has Paino Yes Site Locations Pain Location: Pain in Ulcers With Dressing Change: Yes Duration of the Pain. Constant / Intermittento Constant Pain Management and Medication Current Pain Management: Electronic Signature(s) Signed: 11/14/2019 4:33:58 PM By: [name] Entered By: [name] on 11/14/2019 09:34:55 Salvi, [name] ([id number]) -------------------------------------------------------------------------------- Patient/Caregiver Education Details Patient Name: Hurwitz, [name] Date of Service: 11/14/2019 9:30 AM Medical Record Number: [id number] Patient Account Number: [id number] Date of Birth/Gender: 21-Aug-1965 (54 y.o. M) Treating [name]: [name] Primary Care Physician: [name] Other Clinician: Referring  Physician: Waunita [name] Treating Physician/Extender: [name] in Treatment: 10 Education Assessment Education Provided To: Patient Education Topics Provided Wound/Skin Impairment: Handouts: Caring for Your Ulcer, Other: wound care as prescribed Methods: Demonstration Responses: State content correctly Electronic Signature(s) Signed: 11/15/2019 5:10:21 PM By: [name] Entered By: [name], BSN, [name], CWS, [name] on 11/14/2019 10:00:08 Yager, [name] ([id number]) -------------------------------------------------------------------------------- Wound Assessment Details Patient Name:  Echavarria, [name] Date of Service: 11/14/2019 9:30 AM Medical Record Number: [id number] Patient Account Number: [id number] Date of Birth/Sex: 01/22/1966 (55 y.o. M) Treating [name]: [name] Primary Care Ferdinand Revoir: [name] Other Clinician: Referring Addysin Porco: [name] Treating Olan Kurek/Extender: [name] in Treatment: 10 Wound Status Wound Number: 1 Primary Etiology: Abscess Wound Location: Midline Sacrum Wound Status: Open Wounding Event: Gradually Appeared Date Acquired: 07/24/2019 Weeks Of Treatment: 10 Clustered Wound: No Photos Wound Measurements Length: (cm) 1.2 Width: (cm) 2.3 Depth: (cm) 2.5 Area: (cm) 2.168 Volume: (cm) 5.419 % Reduction in Area: 73.8% % Reduction in Volume: 85.5% Epithelialization: None Tunneling: No Undermining: No Wound Description Classification: Full Thickness With Exposed Support Structures Wound Margin: Thickened Exudate Amount: Medium Exudate Type: Serosanguineous Exudate Color: red, brown Foul Odor After Cleansing: Yes Due to Product Use: No Slough/Fibrino No Wound Bed Granulation Amount: Large (67-100%) Exposed Structure Granulation Quality: Red Fascia Exposed: No Necrotic Amount: Small (1-33%) Fat Layer (Subcutaneous Tissue) Exposed: Yes Necrotic Quality: Adherent Slough Tendon Exposed: No Muscle Exposed: No Joint Exposed: No Bone Exposed: No Treatment Notes Wound #1 (Midline Sacrum) Notes Silver alginate packed lightly into entire open space, secured with bordered foam dressing. Electronic Signature(s) Signed: 11/14/2019 4:33:58 PM By: [name] GARNEROmero, Gobble ([id number]) Signed: 11/15/2019 5:10:21 PM By: [name] Entered By: [name], BSN, [name], CWS, [name] on 11/14/2019 09:54:45 Cooksey, [name] ([id number]) -------------------------------------------------------------------------------- [place] Details Patient Name: Busick, SHAMIL BOSSCHER. Date of Service: 11/14/2019 9:30  AM Medical Record Number: [id number] Patient Account Number: [id number] Date of Birth/Sex: 10/05/1965 (54 y.o. M) Treating [name]: [name] Primary Care Alliah Boulanger: [name] Other Clinician: Referring Laverle Pillard: [name] Treating Mahogany Torrance/Extender: [name] in Treatment: 10 Vital Signs Time Taken: 09:34 Temperature (F): 98.3 Height (in): 72 Pulse (bpm): 84 Weight (lbs): 230 Respiratory Rate (breaths/min): 16 Body Mass Index (BMI): 31.2 Blood Pressure (mmHg): 113/68 Reference Range: 80 - 120 mg / dl Electronic Signature(s) Signed: 11/14/2019 4:33:58 PM By: [name] Entered By: [name] on 11/14/2019 09:36:20

## 2019-11-15 NOTE — Progress Notes (Signed)
Kinsella, CARTEZ TRITZ ([id number]) Visit Report for 11/14/2019 HPI Details Patient Name: [name], [name] Date of Service: 11/14/2019 9:30 AM Medical Record Number: [id number] Patient Account Number: [id number] Date of Birth/Sex: 04/20/1966 (54 y.o. M) Treating RN: [name] Primary Care Provider: [name] Other Clinician: Referring Provider: [name] Treating Provider/Extender: [name] in Treatment: 10 History of Present Illness HPI Description: ADMISSION 09/05/2019 This is a 54 year old man who has a complicated history which started with acute low back pain sometime in September. Saw his primary doctor had a CT scan of the abdomen and pelvis that was normal. By late December he was discovered to have a lumbar epidural abscess with cauda equina syndrome. He was admitted to hospital and had a decompressive laminectomy at L3/L4/L5 blood cultures at the time showed MSSA he received IV nafcillin. He went to rehab from 1/5 through 1/15 there he was noted to have an "pressure injury in the skin" although I did not see much more about this in the discharge instructions. Unfortunately this wound became infected after he was discharged home. He required readmission from 1/25 through 2/2 with sepsis, discovered to have a right psoas abscess as well as an infected decubitus ulcer. He had a bedside debridement by general surgery on 1/25 interventional radiology drain the right psoas abscess. General surgery recommended 3 times daily wet-to-dry dressings and an air mattress. He has since been discharged home. He is mobile with a walker. They are using 3 times daily wet-to-dry dressings. According to his family he is eating well. He has a protein supplement but he he is not taking it. He is currently on ampicillin sulbactam as recommended by [name] this is due to be finished currently on 2/22 although there seem to be some suggestion it might go longer. They say he had blood work 2  days ago which I will review. The last lab work I saw was a sedimentation rate of 81 and a C-reactive protein of 212 on 1/25. I did not see any imaging studies of the underlying bone although I will need to review the CT scans that he has had Past medical history, polymyalgia rheumatica, left total hip replacement in 2018 for avascular necrosis, cellulitis of the scrotum in July 2020, history of alcohol abuse but that is not currently problematic 09/12/2019; patient readmitted to the clinic last week. I put him on a wet-to-dry dressing with silver sorb gel to the larger wound area on his coccyx and proximal right buttocks. These are actually connected. In general the wound surfaces look quite good although they are deep and there is a large amount of undermining. There is no exposed bone. Last lab work on 2/8 that was ordered by [name] showed a CRP of 68.5 and a sedimentation rate of greater than 130. The CRP was elevated versus 18.3 on 1/10 although it is difficult to interpret these necessarily in somebody with polymyalgia rheumatica. His white count was 15.7 hemoglobin 10.7 The patient is offloading this is much as he can says that he is eating and drinking well which seems verified by his family member. 2/24; patient with a large wound on the lower sacrum and proximal right buttocks. There is a bridge of overlying skin but the wounds are connected and there is wide undermining. We are using wet-to-dry dressings. I have reviewed [name] notes of 09/17/2019 he is remove the PICC line. He had a prolonged course of IV antibiotics for the MSSA bacteremia, lumbar infection, psoas  abscess and a necrotic sacral wound. Interestingly I noticed the same history of PMR in [place] link that [name] comments on. I went over the case again with the patient and his daughter he is totally unfamiliar with anything to do with polymyalgia rheumatica or its obvious symptoms that I also described. I  told him that this is not a diagnosis that most doctors would make and put in an E HR without documentation but he is just not familiar with it and neither his his daughter. For now we are using wet- to-dry dressings. I think it is time to try a wound VAC now that we are certain about underlying infection issues 3/3; he arrives today with his wound actually looks somewhat better. Two small areas with a bridge of skin actually have contracted I believe there is less depth. There is still extensive undermining. He got his VAC machine yesterday but they have still not put it on. 3/10; the orifice of the actual wound is contracting faster than the undermining dimensions of the wound. This is probably soon going to make this difficult to continue with the [facility] 10/15/2019 upon evaluation today patient appears to be doing well in general with regard to his wound. Apparently this looks good although I have not really seen him he has been seen by [name] up to this point. Nonetheless he is currently on a wound VAC and apparently has been having some odor over the past several days which is why comes in for evaluation earlier today. I am going obtain a wound culture and then subsequently we will likely place the patient on an antibiotic at this point to try to help out with the odor which I presume is coming from a bacterial infection. The patient has been off of all antibiotics for the past month having ended on 22 February. With that being said he is not having any increased pain or anything else going on at this point which is good news. He is not allergic to any antibiotics that he is aware of. 4/7; it has been almost a month since I saw this patient although he was here 2 weeks ago. He had a wound VAC on this wound. I note that he was treated for infection with 2 weeks of Levaquin. We had a report recently from home health that there was an extensive rash apparently in the periwound and they took the [facility]  off for a few days however the rash appears to have cleaned up and the wound VAC was back on on his presentation today. Since I saw this last the year is been considerable reduction in overall wound area 4/21; 2-week follow-up. Still using the wound VAC although I am not completely certain that [place] home health is putting this on continuously per discussion with the patient's family. He still has the 2 connecting wounds. The larger area is more midline the smaller area to the right. There is no undermining beyond the smaller wound that I was able to determine. However the larger wound slopes towards that area. We have some improvement in the overall wound measurements still using the wound VAC. Dory, SIRIS GRAUS ([id number]) Electronic Signature(s) Signed: 11/15/2019 7:42:01 AM By: Linton Ham MD Entered By: Linton Ham on 11/14/2019 10:10:01 Bok, [name] ([id number]) -------------------------------------------------------------------------------- Physical Exam Details Patient Name: Fross, [name] Date of Service: 11/14/2019 9:30 AM Medical Record Number: [id number] Patient Account Number: [id number] Date of Birth/Sex: 1966/07/01 (54 y.o. M) Treating RN: [name]  Primary Care Provider: [name] Other Clinician: Referring Provider: [name] Treating Provider/Extender: Ricard Dillon Weeks in Treatment: 10 Constitutional Sitting or standing Blood Pressure is within target range for patient.. Pulse regular and within target range for patient.Marland Kitchen Respirations regular, non- labored and within target range.. Temperature is normal and within the target range for the patient.Marland Kitchen appears in no distress. Notes Wound exam; the wound has certainly contracted. There are 2 open areas. They connect but I do not think the wound goes any further to the right in the small open area. I spent some time trying to determine this. There is no evidence of infection in the wound bed or soft  tissue infection no palpable Electronic Signature(s) Signed: 11/15/2019 7:42:01 AM By: Linton Ham MD Entered By: Linton Ham on 11/14/2019 10:25:24 Biswas, [name] ([id number]) -------------------------------------------------------------------------------- Physician Orders Details Patient Name: Ohmer, Anquan R. Date of Service: 11/14/2019 9:30 AM Medical Record Number: [id number] Patient Account Number: [id number] Date of Birth/Sex: 11-Jan-1966 (54 y.o. M) Treating RN: [name] Primary Care Provider: [name] Other Clinician: Referring Provider: [name] Treating Provider/Extender: [name] in Treatment: 10 Verbal / Phone Orders: No Diagnosis Coding Wound Cleansing Wound #1 Midline Sacrum o Clean wound with Normal Saline. Anesthetic (add to Medication List) Wound #1 Midline Sacrum o Topical Lidocaine 4% cream applied to wound bed prior to debridement (In Clinic Only). Skin Barriers/Peri-Wound Care Wound #1 Midline Sacrum o Skin Prep Primary Wound Dressing Wound #1 Midline Sacrum o Other: - Silver alginate packing (in entire open space) Secondary Dressing Wound #1 Midline Sacrum o Boardered Foam Dressing Dressing Change Frequency Wound #1 Midline Sacrum o Change dressing every day. - Patient's daughter to change on days home heath does not come out. o Change Dressing Monday, Wednesday, Friday - Homeheath Follow-up Appointments Wound #1 Midline Sacrum o Return Appointment in 2 weeks. Off-Loading Wound #1 Midline Sacrum o Other: - NO Pressure on the wounded areas. Additional Orders / Instructions Wound #1 Midline Sacrum o Increase protein intake. - Powder protein supplement, multivitamin o Activity as tolerated Home Health Wound #1 Midline [place] Visits - [facility]-order supplies for patients daughter to be able to change on days you do not come out [facility] Nurse may visit PRN to address  patientos wound care needs. o FACE TO FACE ENCOUNTER: MEDICARE and MEDICAID PATIENTS: I certify that this patient is under my care and that I had a face-to- face encounter that meets the physician face-to-face encounter requirements with this patient on this date. The encounter with the patient was in whole or in part for the following MEDICAL CONDITION: (primary reason for [place]) MEDICAL NECESSITY: I certify, that based on my findings, NURSING services are a medically necessary home health service. HOME BOUND STATUS: I certify that my clinical findings support that this patient is homebound (i.e., Due to illness or injury, pt requires aid of supportive devices such as crutches, cane, wheelchairs, walkers, the use of special transportation or the assistance of another person to leave their place of residence. There is a normal inability to leave the home and doing so requires considerable and taxing effort. Other absences are for medical reasons / religious services and are infrequent or of short duration when for other reasons). Cozad, MAJED COLGROVE ([id number]) o If current dressing causes regression in wound condition, may D/C ordered dressing product/s and apply Normal Saline Moist Dressing daily until next [place] / Other MD appointment. Notify [facility] of regression in wound condition at [phone]. o Please direct any NON-WOUND related issues/requests for orders to patient's Primary Care Physician Negative Pressure Wound Therapy Wound #1 Midline Sacrum o Place NPWT on HOLD. - HOLD NPWT for 2 weeks. Electronic Signature(s) Signed: 11/15/2019 7:42:01 AM By: Linton Ham MD Signed: 11/15/2019 5:10:21 PM By: Gretta Cool, BSN, RN, CWS, Kim RN, BSN Entered By: Gretta Cool, BSN, RN, CWS, Kim on 11/14/2019 10:03:55 Dasaro, [name] ([id number]) -------------------------------------------------------------------------------- Problem List Details Patient Name: Chancy,  CHANNIN LENNARTZ. Date of Service: 11/14/2019 9:30 AM Medical Record Number: [id number] Patient Account Number: [id number] Date of Birth/Sex: 12/02/1965 (54 y.o. M) Treating RN: [name] Primary Care Provider: [name] Other Clinician: Referring Provider: [name] Treating Provider/Extender: [name] in Treatment: 10 Active Problems ICD-10 Evaluated Encounter Code Description Active Date Today Diagnosis L89.154 Pressure ulcer of sacral region, stage 4 09/05/2019 No Yes T81.31XD Disruption of external operation (surgical) wound, not elsewhere classified, 09/05/2019 No Yes subsequent encounter L02.31 Cutaneous abscess of buttock 09/05/2019 No Yes Inactive Problems Resolved Problems Electronic Signature(s) Signed: 11/15/2019 7:42:01 AM By: Linton Ham MD Entered By: Linton Ham on 11/14/2019 10:08:01 Monjaraz, [name] ([id number]) -------------------------------------------------------------------------------- Progress Note Details Patient Name: Fluegel, [name] Date of Service: 11/14/2019 9:30 AM Medical Record Number: [id number] Patient Account Number: [id number] Date of Birth/Sex: 05/25/1966 (54 y.o. M) Treating RN: [name] Primary Care Provider: [name] Other Clinician: Referring Provider: [name] Treating Provider/Extender: [name] in Treatment: 10 Subjective History of Present Illness (HPI) ADMISSION 09/05/2019 This is a 54 year old man who has a complicated history which started with acute low back pain sometime in September. Saw his primary doctor had a CT scan of the abdomen and pelvis that was normal. By late December he was discovered to have a lumbar epidural abscess with cauda equina syndrome. He was admitted to hospital and had a decompressive laminectomy at L3/L4/L5 blood cultures at the time showed MSSA he received IV nafcillin. He went to rehab from 1/5 through 1/15 there he was noted to have an "pressure injury in the  skin" although I did not see much more about this in the discharge instructions. Unfortunately this wound became infected after he was discharged home. He required readmission from 1/25 through 2/2 with sepsis, discovered to have a right psoas abscess as well as an infected decubitus ulcer. He had a bedside debridement by general surgery on 1/25 interventional radiology drain the right psoas abscess. General surgery recommended 3 times daily wet-to-dry dressings and an air mattress. He has since been discharged home. He is mobile with a walker. They are using 3 times daily wet-to-dry dressings. According to his family he is eating well. He has a protein supplement but he he is not taking it. He is currently on ampicillin sulbactam as recommended by [name] this is due to be finished currently on 2/22 although there seem to be some suggestion it might go longer. They say he had blood work 2 days ago which I will review. The last lab work I saw was a sedimentation rate of 81 and a C-reactive protein of 212 on 1/25. I did not see any imaging studies of the underlying bone although I will need to review the CT scans that he has had Past medical history, polymyalgia rheumatica, left total hip replacement in 2018 for avascular necrosis, cellulitis of the scrotum in July 2020, history of alcohol abuse but that is not currently problematic 09/12/2019; patient readmitted to the  clinic last week. I put him on a wet-to-dry dressing with silver sorb gel to the larger wound area on his coccyx and proximal right buttocks. These are actually connected. In general the wound surfaces look quite good although they are deep and there is a large amount of undermining. There is no exposed bone. Last lab work on 2/8 that was ordered by [name] showed a CRP of 68.5 and a sedimentation rate of greater than 130. The CRP was elevated versus 18.3 on 1/10 although it is difficult to interpret these necessarily in  somebody with polymyalgia rheumatica. His white count was 15.7 hemoglobin 10.7 The patient is offloading this is much as he can says that he is eating and drinking well which seems verified by his family member. 2/24; patient with a large wound on the lower sacrum and proximal right buttocks. There is a bridge of overlying skin but the wounds are connected and there is wide undermining. We are using wet-to-dry dressings. I have reviewed [name] notes of 09/17/2019 he is remove the PICC line. He had a prolonged course of IV antibiotics for the MSSA bacteremia, lumbar infection, psoas abscess and a necrotic sacral wound. Interestingly I noticed the same history of PMR in [place] link that [name] comments on. I went over the case again with the patient and his daughter he is totally unfamiliar with anything to do with polymyalgia rheumatica or its obvious symptoms that I also described. I told him that this is not a diagnosis that most doctors would make and put in an E HR without documentation but he is just not familiar with it and neither his his daughter. For now we are using wet- to-dry dressings. I think it is time to try a wound VAC now that we are certain about underlying infection issues 3/3; he arrives today with his wound actually looks somewhat better. Two small areas with a bridge of skin actually have contracted I believe there is less depth. There is still extensive undermining. He got his VAC machine yesterday but they have still not put it on. 3/10; the orifice of the actual wound is contracting faster than the undermining dimensions of the wound. This is probably soon going to make this difficult to continue with the [facility] 10/15/2019 upon evaluation today patient appears to be doing well in general with regard to his wound. Apparently this looks good although I have not really seen him he has been seen by [name] up to this point. Nonetheless he is currently on a wound  VAC and apparently has been having some odor over the past several days which is why comes in for evaluation earlier today. I am going obtain a wound culture and then subsequently we will likely place the patient on an antibiotic at this point to try to help out with the odor which I presume is coming from a bacterial infection. The patient has been off of all antibiotics for the past month having ended on 22 February. With that being said he is not having any increased pain or anything else going on at this point which is good news. He is not allergic to any antibiotics that he is aware of. 4/7; it has been almost a month since I saw this patient although he was here 2 weeks ago. He had a wound VAC on this wound. I note that he was treated for infection with 2 weeks of Levaquin. We had a report recently from home health  that there was an extensive rash apparently in the periwound and they took the [facility] off for a few days however the rash appears to have cleaned up and the wound VAC was back on on his presentation today. Since I saw this last the year is been considerable reduction in overall wound area 4/21; 2-week follow-up. Still using the wound VAC although I am not completely certain that [place] home health is putting this on continuously per discussion with the patient's family. He still has the 2 connecting wounds. The larger area is more midline the smaller area to the right. There is no undermining beyond the smaller wound that I was able to determine. However the larger wound slopes towards that area. We have some improvement in the overall wound measurements still using the wound VAC. Masih, BERT GETZ. ([id number]) Objective Constitutional Sitting or standing Blood Pressure is within target range for patient.. Pulse regular and within target range for patient.Marland Kitchen Respirations regular, non- labored and within target range.. Temperature is normal and within the target range for the patient.Marland Kitchen  appears in no distress. Vitals Time Taken: 9:34 AM, Height: 72 in, Weight: 230 lbs, BMI: 31.2, Temperature: 98.3 F, Pulse: 84 bpm, Respiratory Rate: 16 breaths/min, Blood Pressure: 113/68 mmHg. General Notes: Wound exam; the wound has certainly contracted. There are 2 open areas. They connect but I do not think the wound goes any further to the right in the small open area. I spent some time trying to determine this. There is no evidence of infection in the wound bed or soft tissue infection no palpable Integumentary (Hair, Skin) Wound #1 status is Open. Original cause of wound was Gradually Appeared. The wound is located on the Midline Sacrum. The wound measures 1.2cm length x 2.3cm width x 2.5cm depth; 2.168cm^2 area and 5.419cm^3 volume. There is Fat Layer (Subcutaneous Tissue) Exposed exposed. There is no tunneling or undermining noted. There is a medium amount of serosanguineous drainage noted. Foul odor after cleansing was noted. The wound margin is thickened. There is large (67-100%) red granulation within the wound bed. There is a small (1-33%) amount of necrotic tissue within the wound bed including Adherent Slough. Assessment Active Problems ICD-10 Pressure ulcer of sacral region, stage 4 Disruption of external operation (surgical) wound, not elsewhere classified, subsequent encounter Cutaneous abscess of buttock Plan Wound Cleansing: Wound #1 Midline Sacrum: Clean wound with Normal Saline. Anesthetic (add to Medication List): Wound #1 Midline Sacrum: Topical Lidocaine 4% cream applied to wound bed prior to debridement (In Clinic Only). Skin Barriers/Peri-Wound Care: Wound #1 Midline Sacrum: Skin Prep Primary Wound Dressing: Wound #1 Midline Sacrum: Other: - Silver alginate packing (in entire open space) Secondary Dressing: Wound #1 Midline Sacrum: Boardered Foam Dressing Dressing Change Frequency: Wound #1 Midline Sacrum: Change dressing every day. - Patient's  daughter to change on days home heath does not come out. Change Dressing Monday, Wednesday, Friday - Homeheath Follow-up Appointments: Wound #1 Midline Sacrum: Return Appointment in 2 weeks. Hosking, ADAIN PYO. ([id number]) Off-Loading: Wound #1 Midline Sacrum: Other: - NO Pressure on the wounded areas. Additional Orders / Instructions: Wound #1 Midline Sacrum: Increase protein intake. - Powder protein supplement, multivitamin Activity as tolerated Home Health: Wound #1 Midline Sacrum: [place] Visits - [facility]-order supplies for patients daughter to be able to change on days you do not come out Home Health Nurse may visit PRN to address patient s wound care needs. FACE TO FACE ENCOUNTER: MEDICARE and MEDICAID PATIENTS: I certify that this patient  is under my care and that I had a face-to-face encounter that meets the physician face-to-face encounter requirements with this patient on this date. The encounter with the patient was in whole or in part for the following MEDICAL CONDITION: (primary reason for [place]) MEDICAL NECESSITY: I certify, that based on my findings, NURSING services are a medically necessary home health service. HOME BOUND STATUS: I certify that my clinical findings support that this patient is homebound (i.e., Due to illness or injury, pt requires aid of supportive devices such as crutches, cane, wheelchairs, walkers, the use of special transportation or the assistance of another person to leave their place of residence. There is a normal inability to leave the home and doing so requires considerable and taxing effort. Other absences are for medical reasons / religious services and are infrequent or of short duration when for other reasons). If current dressing causes regression in wound condition, may D/C ordered dressing product/s and apply Normal Saline Moist Dressing daily until next [place] / Other MD appointment. [place] of regression in wound condition at [phone]. Please direct any NON-WOUND related issues/requests for orders to patient's Primary Care Physician Negative Pressure Wound Therapy: Wound #1 Midline Sacrum: Place NPWT on HOLD. - HOLD NPWT for 2 weeks. 1. I am going to put the wound VAC on pause. Silver alginate packing. Border foam dressing cover. We are changing this Monday Wednesday and Friday via home health. Careful attention to the measurements again in 2 weeks. 2. I am not prepared to say that the wound VAC is not going to be used at all so I have asked them not to send this back 3. I see no evidence of infection here. 4. The bridge between the 2 open areas is a lot of tissue I would be reluctant to move this Electronic Signature(s) Signed: 11/15/2019 7:42:01 AM By: Linton Ham MD Entered By: Linton Ham on 11/14/2019 10:28:10 Bracher, [name] ([id number]) -------------------------------------------------------------------------------- SuperBill Details Patient Name: Gladson, [name] Date of Service: 11/14/2019 Medical Record Number: [id number] Patient Account Number: [id number] Date of Birth/Sex: 03/30/1966 (54 y.o. M) Treating RN: [name] Primary Care Provider: [name] Other Clinician: Referring Provider: [name] Treating Provider/Extender: [name] in Treatment: 10 Diagnosis Coding ICD-10 Codes Code Description L89.154 Pressure ulcer of sacral region, stage 4 T81.31XD Disruption of external operation (surgical) wound, not elsewhere classified, subsequent encounter L02.31 Cutaneous abscess of buttock Facility Procedures CPT4 Code: [id number] Description: 99213 - WOUND CARE VISIT-LEV 3 EST PT Modifier: Quantity: 1 Physician Procedures CPT4 Code Description: [id number] - WC PHYS LEVEL 3 - EST PT Modifier: Quantity: 1 CPT4 Code Description: ICD-10 Diagnosis Description L89.154 Pressure ulcer of sacral region, stage 4 T81.31XD  Disruption of external operation (surgical) wound, not elsewhere classified Modifier: , subsequent encount Quantity: er Engineer, maintenance) Signed: 11/15/2019 7:42:01 AM By: Linton Ham MD Entered By: Linton Ham on 11/14/2019 10:28:32

## 2019-11-16 DIAGNOSIS — M5144 Schmorl's nodes, thoracic region: Secondary | ICD-10-CM | POA: Diagnosis not present

## 2019-11-16 DIAGNOSIS — M48061 Spinal stenosis, lumbar region without neurogenic claudication: Secondary | ICD-10-CM | POA: Diagnosis not present

## 2019-11-16 DIAGNOSIS — J9811 Atelectasis: Secondary | ICD-10-CM | POA: Diagnosis not present

## 2019-11-16 DIAGNOSIS — L89312 Pressure ulcer of right buttock, stage 2: Secondary | ICD-10-CM | POA: Diagnosis not present

## 2019-11-16 DIAGNOSIS — G4700 Insomnia, unspecified: Secondary | ICD-10-CM | POA: Diagnosis not present

## 2019-11-16 DIAGNOSIS — L89894 Pressure ulcer of other site, stage 4: Secondary | ICD-10-CM | POA: Diagnosis not present

## 2019-11-16 DIAGNOSIS — I081 Rheumatic disorders of both mitral and tricuspid valves: Secondary | ICD-10-CM | POA: Diagnosis not present

## 2019-11-16 DIAGNOSIS — G834 Cauda equina syndrome: Secondary | ICD-10-CM | POA: Diagnosis not present

## 2019-11-16 DIAGNOSIS — M4646 Discitis, unspecified, lumbar region: Secondary | ICD-10-CM | POA: Diagnosis not present

## 2019-11-16 DIAGNOSIS — L89154 Pressure ulcer of sacral region, stage 4: Secondary | ICD-10-CM | POA: Diagnosis not present

## 2019-11-16 DIAGNOSIS — D72829 Elevated white blood cell count, unspecified: Secondary | ICD-10-CM | POA: Diagnosis not present

## 2019-11-16 DIAGNOSIS — N319 Neuromuscular dysfunction of bladder, unspecified: Secondary | ICD-10-CM | POA: Diagnosis not present

## 2019-11-16 DIAGNOSIS — M47816 Spondylosis without myelopathy or radiculopathy, lumbar region: Secondary | ICD-10-CM | POA: Diagnosis not present

## 2019-11-16 DIAGNOSIS — M4316 Spondylolisthesis, lumbar region: Secondary | ICD-10-CM | POA: Diagnosis not present

## 2019-11-16 DIAGNOSIS — M40204 Unspecified kyphosis, thoracic region: Secondary | ICD-10-CM | POA: Diagnosis not present

## 2019-11-16 DIAGNOSIS — M4804 Spinal stenosis, thoracic region: Secondary | ICD-10-CM | POA: Diagnosis not present

## 2019-11-16 DIAGNOSIS — M4604 Spinal enthesopathy, thoracic region: Secondary | ICD-10-CM | POA: Diagnosis not present

## 2019-11-17 DIAGNOSIS — L89154 Pressure ulcer of sacral region, stage 4: Secondary | ICD-10-CM | POA: Diagnosis not present

## 2019-11-18 DIAGNOSIS — L89154 Pressure ulcer of sacral region, stage 4: Secondary | ICD-10-CM | POA: Diagnosis not present

## 2019-11-19 ENCOUNTER — Encounter: Payer: Self-pay | Admitting: Family Medicine

## 2019-11-19 ENCOUNTER — Telehealth (INDEPENDENT_AMBULATORY_CARE_PROVIDER_SITE_OTHER): Payer: BC Managed Care – PPO | Admitting: Family Medicine

## 2019-11-19 ENCOUNTER — Other Ambulatory Visit: Payer: Self-pay

## 2019-11-19 VITALS — Wt 219.0 lb

## 2019-11-19 DIAGNOSIS — Z9889 Other specified postprocedural states: Secondary | ICD-10-CM

## 2019-11-19 DIAGNOSIS — F4321 Adjustment disorder with depressed mood: Secondary | ICD-10-CM

## 2019-11-19 DIAGNOSIS — G834 Cauda equina syndrome: Secondary | ICD-10-CM | POA: Diagnosis not present

## 2019-11-19 DIAGNOSIS — L8990 Pressure ulcer of unspecified site, unspecified stage: Secondary | ICD-10-CM | POA: Diagnosis not present

## 2019-11-19 DIAGNOSIS — L89154 Pressure ulcer of sacral region, stage 4: Secondary | ICD-10-CM | POA: Diagnosis not present

## 2019-11-19 MED ORDER — OXYCODONE HCL 10 MG PO TABS: 10.0000 mg | ORAL_TABLET | ORAL | 0 refills | Status: DC | PRN

## 2019-11-19 NOTE — Assessment & Plan Note (Signed)
Symptoms consistent with depressed mood in setting of severe illness and change in his daily activities. Discussed that he is improving - though slowly - and that given duration and triggers therapy would be a good starting place. Medication is an option but recommended starting with therapy and he agreed to try. He is having sleep changes, but is not extremely bothered. Discussed working on continuing education every morning and afternoon to help given himself some sort of schedule. He will consider trying this. Melatonin if issues falling asleep though suspect if daytime sleeping stopped he would be doing better.

## 2019-11-19 NOTE — Progress Notes (Signed)
I connected with [name] on 11/19/19 at  4:00 PM EDT by video and verified that I am speaking with the correct person using two identifiers.   I discussed the limitations, risks, security and privacy concerns of performing an evaluation and management service by video and the availability of in person appointments. I also discussed with the patient that there may be a patient responsible charge related to this service. The patient expressed understanding and agreed to proceed.  Patient location: Home Provider Location: [place] Participants: Lesleigh Noe and [name] and daughter Mickel Baas   Subjective:     [name] is a 54 y.o. male presenting for Depression and Medication Refill (Oxycodone-has 9 tablets left.)     HPI   #Depression - tired on not doing anything - some good days and some bad days - some days he feels like he has accomplished something and other days are worse - days and nights are mixed up - sleeping during the day and staying up all night - has not tried sleep aids - will try to go to bed at 11 pm now he will sleep late and then he isn't tired and will stay awake until 3 am - does not think sleep is making an impact - daughter says he gets frustrated because he cannot do anything   # Back pain/wound healing - if not active no pain - if he is active - carrying things - oxycodone once today to go outside - and took one before he started some activity - down to one a day and will take before the wound care visit - will still take tylenol as needed too   Review of Systems   Social History   Tobacco Use  Smoking Status Former Smoker  . Packs/day: 1.00  . Years: 14.00  . Pack years: 14.00  . Types: Cigarettes  . Quit date: 07/27/1999  . Years since quitting: 20.3  Smokeless Tobacco Never Used        Objective:   BP Readings from Last 3 Encounters:  11/14/19 111/69  10/10/19 101/63  10/09/19 94/70   Wt Readings  from Last 3 Encounters:  11/19/19 219 lb (99.3 kg)  11/14/19 219 lb (99.3 kg)  10/10/19 225 lb 9.6 oz (102.3 kg)    Wt 219 lb (99.3 kg) Comment: per patient  BMI 29.70 kg/m     Physical Exam Constitutional:      Appearance: Normal appearance. He is not ill-appearing.  HENT:     Head: Normocephalic and atraumatic.     Right Ear: External ear normal.     Left Ear: External ear normal.  Eyes:     Conjunctiva/sclera: Conjunctivae normal.  Pulmonary:     Effort: Pulmonary effort is normal. No respiratory distress.  Neurological:     Mental Status: He is alert. Mental status is at baseline.  Psychiatric:        Mood and Affect: Mood normal.        Behavior: Behavior normal.        Thought Content: Thought content normal.        Judgment: Judgment normal.      Depression screen [facility] 2/9 11/19/2019 11/14/2019 09/03/2019  Decreased Interest 3 0 0  Down, Depressed, Hopeless 2 1 1   PHQ - 2 Score 5 1 1   Altered sleeping 3 - -  Tired, decreased energy 0 - -  Change in appetite 0 - -  Feeling  bad or failure about yourself  0 - -  Trouble concentrating 0 - -  Moving slowly or fidgety/restless 3 - -  Suicidal thoughts 0 - -  PHQ-9 Score 11 - -  Difficult doing work/chores Somewhat difficult - -           Assessment & Plan:   Problem List Items Addressed This Visit      Nervous and Auditory   Cauda equina syndrome (HCC)   Relevant Medications   Oxycodone HCl 10 MG TABS     Musculoskeletal and Integument   Pressure injury of skin    Pt still requiring prn oxycodone for wound care and for more intense activity. Refill provided. Will continue to monitor and encouraged limiting use as much as able.       Relevant Medications   Oxycodone HCl 10 MG TABS     Other   S/P lumbar laminectomy   Relevant Medications   Oxycodone HCl 10 MG TABS   Adjustment disorder with depressed mood - Primary    Symptoms consistent with depressed mood in setting of severe illness and change  in his daily activities. Discussed that he is improving - though slowly - and that given duration and triggers therapy would be a good starting place. Medication is an option but recommended starting with therapy and he agreed to try. He is having sleep changes, but is not extremely bothered. Discussed working on continuing education every morning and afternoon to help given himself some sort of schedule. He will consider trying this. Melatonin if issues falling asleep though suspect if daytime sleeping stopped he would be doing better.       Relevant Orders   Ambulatory referral to Psychology       Return in about 6 weeks (around 12/31/2019).  Lesleigh Noe, MD

## 2019-11-19 NOTE — Assessment & Plan Note (Signed)
Pt still requiring prn oxycodone for wound care and for more intense activity. Refill provided. Will continue to monitor and encouraged limiting use as much as able.

## 2019-11-20 DIAGNOSIS — L89312 Pressure ulcer of right buttock, stage 2: Secondary | ICD-10-CM | POA: Diagnosis not present

## 2019-11-20 DIAGNOSIS — M4804 Spinal stenosis, thoracic region: Secondary | ICD-10-CM | POA: Diagnosis not present

## 2019-11-20 DIAGNOSIS — I081 Rheumatic disorders of both mitral and tricuspid valves: Secondary | ICD-10-CM | POA: Diagnosis not present

## 2019-11-20 DIAGNOSIS — L89894 Pressure ulcer of other site, stage 4: Secondary | ICD-10-CM | POA: Diagnosis not present

## 2019-11-20 DIAGNOSIS — M48061 Spinal stenosis, lumbar region without neurogenic claudication: Secondary | ICD-10-CM | POA: Diagnosis not present

## 2019-11-20 DIAGNOSIS — M5144 Schmorl's nodes, thoracic region: Secondary | ICD-10-CM | POA: Diagnosis not present

## 2019-11-20 DIAGNOSIS — M40204 Unspecified kyphosis, thoracic region: Secondary | ICD-10-CM | POA: Diagnosis not present

## 2019-11-20 DIAGNOSIS — G834 Cauda equina syndrome: Secondary | ICD-10-CM | POA: Diagnosis not present

## 2019-11-20 DIAGNOSIS — L89154 Pressure ulcer of sacral region, stage 4: Secondary | ICD-10-CM | POA: Diagnosis not present

## 2019-11-20 DIAGNOSIS — G4700 Insomnia, unspecified: Secondary | ICD-10-CM | POA: Diagnosis not present

## 2019-11-20 DIAGNOSIS — M4646 Discitis, unspecified, lumbar region: Secondary | ICD-10-CM | POA: Diagnosis not present

## 2019-11-20 DIAGNOSIS — J9811 Atelectasis: Secondary | ICD-10-CM | POA: Diagnosis not present

## 2019-11-20 DIAGNOSIS — M4604 Spinal enthesopathy, thoracic region: Secondary | ICD-10-CM | POA: Diagnosis not present

## 2019-11-20 DIAGNOSIS — D72829 Elevated white blood cell count, unspecified: Secondary | ICD-10-CM | POA: Diagnosis not present

## 2019-11-20 DIAGNOSIS — N319 Neuromuscular dysfunction of bladder, unspecified: Secondary | ICD-10-CM | POA: Diagnosis not present

## 2019-11-20 DIAGNOSIS — M47816 Spondylosis without myelopathy or radiculopathy, lumbar region: Secondary | ICD-10-CM | POA: Diagnosis not present

## 2019-11-20 DIAGNOSIS — M4316 Spondylolisthesis, lumbar region: Secondary | ICD-10-CM | POA: Diagnosis not present

## 2019-11-21 DIAGNOSIS — M4316 Spondylolisthesis, lumbar region: Secondary | ICD-10-CM | POA: Diagnosis not present

## 2019-11-21 DIAGNOSIS — L89894 Pressure ulcer of other site, stage 4: Secondary | ICD-10-CM | POA: Diagnosis not present

## 2019-11-21 DIAGNOSIS — M4804 Spinal stenosis, thoracic region: Secondary | ICD-10-CM | POA: Diagnosis not present

## 2019-11-21 DIAGNOSIS — J9811 Atelectasis: Secondary | ICD-10-CM | POA: Diagnosis not present

## 2019-11-21 DIAGNOSIS — M5144 Schmorl's nodes, thoracic region: Secondary | ICD-10-CM | POA: Diagnosis not present

## 2019-11-21 DIAGNOSIS — M48061 Spinal stenosis, lumbar region without neurogenic claudication: Secondary | ICD-10-CM | POA: Diagnosis not present

## 2019-11-21 DIAGNOSIS — D72829 Elevated white blood cell count, unspecified: Secondary | ICD-10-CM | POA: Diagnosis not present

## 2019-11-21 DIAGNOSIS — M4646 Discitis, unspecified, lumbar region: Secondary | ICD-10-CM | POA: Diagnosis not present

## 2019-11-21 DIAGNOSIS — G4700 Insomnia, unspecified: Secondary | ICD-10-CM | POA: Diagnosis not present

## 2019-11-21 DIAGNOSIS — L89312 Pressure ulcer of right buttock, stage 2: Secondary | ICD-10-CM | POA: Diagnosis not present

## 2019-11-21 DIAGNOSIS — L89154 Pressure ulcer of sacral region, stage 4: Secondary | ICD-10-CM | POA: Diagnosis not present

## 2019-11-21 DIAGNOSIS — I081 Rheumatic disorders of both mitral and tricuspid valves: Secondary | ICD-10-CM | POA: Diagnosis not present

## 2019-11-21 DIAGNOSIS — M4604 Spinal enthesopathy, thoracic region: Secondary | ICD-10-CM | POA: Diagnosis not present

## 2019-11-21 DIAGNOSIS — G834 Cauda equina syndrome: Secondary | ICD-10-CM | POA: Diagnosis not present

## 2019-11-21 DIAGNOSIS — N319 Neuromuscular dysfunction of bladder, unspecified: Secondary | ICD-10-CM | POA: Diagnosis not present

## 2019-11-21 DIAGNOSIS — L89304 Pressure ulcer of unspecified buttock, stage 4: Secondary | ICD-10-CM | POA: Diagnosis not present

## 2019-11-21 DIAGNOSIS — M40204 Unspecified kyphosis, thoracic region: Secondary | ICD-10-CM | POA: Diagnosis not present

## 2019-11-21 DIAGNOSIS — T8189XA Other complications of procedures, not elsewhere classified, initial encounter: Secondary | ICD-10-CM | POA: Diagnosis not present

## 2019-11-21 DIAGNOSIS — M47816 Spondylosis without myelopathy or radiculopathy, lumbar region: Secondary | ICD-10-CM | POA: Diagnosis not present

## 2019-11-22 DIAGNOSIS — L89154 Pressure ulcer of sacral region, stage 4: Secondary | ICD-10-CM | POA: Diagnosis not present

## 2019-11-23 DIAGNOSIS — M5144 Schmorl's nodes, thoracic region: Secondary | ICD-10-CM | POA: Diagnosis not present

## 2019-11-23 DIAGNOSIS — L89312 Pressure ulcer of right buttock, stage 2: Secondary | ICD-10-CM | POA: Diagnosis not present

## 2019-11-23 DIAGNOSIS — L89894 Pressure ulcer of other site, stage 4: Secondary | ICD-10-CM | POA: Diagnosis not present

## 2019-11-23 DIAGNOSIS — M47816 Spondylosis without myelopathy or radiculopathy, lumbar region: Secondary | ICD-10-CM | POA: Diagnosis not present

## 2019-11-23 DIAGNOSIS — M4646 Discitis, unspecified, lumbar region: Secondary | ICD-10-CM | POA: Diagnosis not present

## 2019-11-23 DIAGNOSIS — G834 Cauda equina syndrome: Secondary | ICD-10-CM | POA: Diagnosis not present

## 2019-11-23 DIAGNOSIS — M40204 Unspecified kyphosis, thoracic region: Secondary | ICD-10-CM | POA: Diagnosis not present

## 2019-11-23 DIAGNOSIS — N319 Neuromuscular dysfunction of bladder, unspecified: Secondary | ICD-10-CM | POA: Diagnosis not present

## 2019-11-23 DIAGNOSIS — I081 Rheumatic disorders of both mitral and tricuspid valves: Secondary | ICD-10-CM | POA: Diagnosis not present

## 2019-11-23 DIAGNOSIS — J9811 Atelectasis: Secondary | ICD-10-CM | POA: Diagnosis not present

## 2019-11-23 DIAGNOSIS — M4804 Spinal stenosis, thoracic region: Secondary | ICD-10-CM | POA: Diagnosis not present

## 2019-11-23 DIAGNOSIS — M4316 Spondylolisthesis, lumbar region: Secondary | ICD-10-CM | POA: Diagnosis not present

## 2019-11-23 DIAGNOSIS — G4700 Insomnia, unspecified: Secondary | ICD-10-CM | POA: Diagnosis not present

## 2019-11-23 DIAGNOSIS — M4604 Spinal enthesopathy, thoracic region: Secondary | ICD-10-CM | POA: Diagnosis not present

## 2019-11-23 DIAGNOSIS — M48061 Spinal stenosis, lumbar region without neurogenic claudication: Secondary | ICD-10-CM | POA: Diagnosis not present

## 2019-11-23 DIAGNOSIS — D72829 Elevated white blood cell count, unspecified: Secondary | ICD-10-CM | POA: Diagnosis not present

## 2019-11-23 DIAGNOSIS — L89154 Pressure ulcer of sacral region, stage 4: Secondary | ICD-10-CM | POA: Diagnosis not present

## 2019-11-24 DIAGNOSIS — M4804 Spinal stenosis, thoracic region: Secondary | ICD-10-CM | POA: Diagnosis not present

## 2019-11-24 DIAGNOSIS — G834 Cauda equina syndrome: Secondary | ICD-10-CM | POA: Diagnosis not present

## 2019-11-24 DIAGNOSIS — D72829 Elevated white blood cell count, unspecified: Secondary | ICD-10-CM | POA: Diagnosis not present

## 2019-11-24 DIAGNOSIS — M40204 Unspecified kyphosis, thoracic region: Secondary | ICD-10-CM | POA: Diagnosis not present

## 2019-11-24 DIAGNOSIS — L89154 Pressure ulcer of sacral region, stage 4: Secondary | ICD-10-CM | POA: Diagnosis not present

## 2019-11-24 DIAGNOSIS — M5144 Schmorl's nodes, thoracic region: Secondary | ICD-10-CM | POA: Diagnosis not present

## 2019-11-24 DIAGNOSIS — J9811 Atelectasis: Secondary | ICD-10-CM | POA: Diagnosis not present

## 2019-11-24 DIAGNOSIS — N319 Neuromuscular dysfunction of bladder, unspecified: Secondary | ICD-10-CM | POA: Diagnosis not present

## 2019-11-24 DIAGNOSIS — M47816 Spondylosis without myelopathy or radiculopathy, lumbar region: Secondary | ICD-10-CM | POA: Diagnosis not present

## 2019-11-24 DIAGNOSIS — G4700 Insomnia, unspecified: Secondary | ICD-10-CM | POA: Diagnosis not present

## 2019-11-24 DIAGNOSIS — L89894 Pressure ulcer of other site, stage 4: Secondary | ICD-10-CM | POA: Diagnosis not present

## 2019-11-24 DIAGNOSIS — M48061 Spinal stenosis, lumbar region without neurogenic claudication: Secondary | ICD-10-CM | POA: Diagnosis not present

## 2019-11-24 DIAGNOSIS — I081 Rheumatic disorders of both mitral and tricuspid valves: Secondary | ICD-10-CM | POA: Diagnosis not present

## 2019-11-24 DIAGNOSIS — M4604 Spinal enthesopathy, thoracic region: Secondary | ICD-10-CM | POA: Diagnosis not present

## 2019-11-24 DIAGNOSIS — M4316 Spondylolisthesis, lumbar region: Secondary | ICD-10-CM | POA: Diagnosis not present

## 2019-11-24 DIAGNOSIS — L89312 Pressure ulcer of right buttock, stage 2: Secondary | ICD-10-CM | POA: Diagnosis not present

## 2019-11-24 DIAGNOSIS — M4646 Discitis, unspecified, lumbar region: Secondary | ICD-10-CM | POA: Diagnosis not present

## 2019-11-25 DIAGNOSIS — L89154 Pressure ulcer of sacral region, stage 4: Secondary | ICD-10-CM | POA: Diagnosis not present

## 2019-11-26 DIAGNOSIS — M5144 Schmorl's nodes, thoracic region: Secondary | ICD-10-CM | POA: Diagnosis not present

## 2019-11-26 DIAGNOSIS — M48061 Spinal stenosis, lumbar region without neurogenic claudication: Secondary | ICD-10-CM | POA: Diagnosis not present

## 2019-11-26 DIAGNOSIS — M40204 Unspecified kyphosis, thoracic region: Secondary | ICD-10-CM | POA: Diagnosis not present

## 2019-11-26 DIAGNOSIS — N319 Neuromuscular dysfunction of bladder, unspecified: Secondary | ICD-10-CM | POA: Diagnosis not present

## 2019-11-26 DIAGNOSIS — I081 Rheumatic disorders of both mitral and tricuspid valves: Secondary | ICD-10-CM | POA: Diagnosis not present

## 2019-11-26 DIAGNOSIS — L89312 Pressure ulcer of right buttock, stage 2: Secondary | ICD-10-CM | POA: Diagnosis not present

## 2019-11-26 DIAGNOSIS — L89894 Pressure ulcer of other site, stage 4: Secondary | ICD-10-CM | POA: Diagnosis not present

## 2019-11-26 DIAGNOSIS — L89154 Pressure ulcer of sacral region, stage 4: Secondary | ICD-10-CM | POA: Diagnosis not present

## 2019-11-26 DIAGNOSIS — G4700 Insomnia, unspecified: Secondary | ICD-10-CM | POA: Diagnosis not present

## 2019-11-26 DIAGNOSIS — M4646 Discitis, unspecified, lumbar region: Secondary | ICD-10-CM | POA: Diagnosis not present

## 2019-11-26 DIAGNOSIS — J9811 Atelectasis: Secondary | ICD-10-CM | POA: Diagnosis not present

## 2019-11-26 DIAGNOSIS — M4604 Spinal enthesopathy, thoracic region: Secondary | ICD-10-CM | POA: Diagnosis not present

## 2019-11-26 DIAGNOSIS — M47816 Spondylosis without myelopathy or radiculopathy, lumbar region: Secondary | ICD-10-CM | POA: Diagnosis not present

## 2019-11-26 DIAGNOSIS — M4316 Spondylolisthesis, lumbar region: Secondary | ICD-10-CM | POA: Diagnosis not present

## 2019-11-26 DIAGNOSIS — D72829 Elevated white blood cell count, unspecified: Secondary | ICD-10-CM | POA: Diagnosis not present

## 2019-11-26 DIAGNOSIS — G834 Cauda equina syndrome: Secondary | ICD-10-CM | POA: Diagnosis not present

## 2019-11-26 DIAGNOSIS — M4804 Spinal stenosis, thoracic region: Secondary | ICD-10-CM | POA: Diagnosis not present

## 2019-11-27 DIAGNOSIS — M40204 Unspecified kyphosis, thoracic region: Secondary | ICD-10-CM | POA: Diagnosis not present

## 2019-11-27 DIAGNOSIS — M5144 Schmorl's nodes, thoracic region: Secondary | ICD-10-CM | POA: Diagnosis not present

## 2019-11-27 DIAGNOSIS — G834 Cauda equina syndrome: Secondary | ICD-10-CM | POA: Diagnosis not present

## 2019-11-27 DIAGNOSIS — M4316 Spondylolisthesis, lumbar region: Secondary | ICD-10-CM | POA: Diagnosis not present

## 2019-11-27 DIAGNOSIS — L89154 Pressure ulcer of sacral region, stage 4: Secondary | ICD-10-CM | POA: Diagnosis not present

## 2019-11-27 DIAGNOSIS — L89312 Pressure ulcer of right buttock, stage 2: Secondary | ICD-10-CM | POA: Diagnosis not present

## 2019-11-27 DIAGNOSIS — M48061 Spinal stenosis, lumbar region without neurogenic claudication: Secondary | ICD-10-CM | POA: Diagnosis not present

## 2019-11-27 DIAGNOSIS — M4604 Spinal enthesopathy, thoracic region: Secondary | ICD-10-CM | POA: Diagnosis not present

## 2019-11-27 DIAGNOSIS — J9811 Atelectasis: Secondary | ICD-10-CM | POA: Diagnosis not present

## 2019-11-27 DIAGNOSIS — M4804 Spinal stenosis, thoracic region: Secondary | ICD-10-CM | POA: Diagnosis not present

## 2019-11-27 DIAGNOSIS — N319 Neuromuscular dysfunction of bladder, unspecified: Secondary | ICD-10-CM | POA: Diagnosis not present

## 2019-11-27 DIAGNOSIS — L89894 Pressure ulcer of other site, stage 4: Secondary | ICD-10-CM | POA: Diagnosis not present

## 2019-11-27 DIAGNOSIS — M4646 Discitis, unspecified, lumbar region: Secondary | ICD-10-CM | POA: Diagnosis not present

## 2019-11-27 DIAGNOSIS — G4700 Insomnia, unspecified: Secondary | ICD-10-CM | POA: Diagnosis not present

## 2019-11-27 DIAGNOSIS — M47816 Spondylosis without myelopathy or radiculopathy, lumbar region: Secondary | ICD-10-CM | POA: Diagnosis not present

## 2019-11-27 DIAGNOSIS — D72829 Elevated white blood cell count, unspecified: Secondary | ICD-10-CM | POA: Diagnosis not present

## 2019-11-27 DIAGNOSIS — I081 Rheumatic disorders of both mitral and tricuspid valves: Secondary | ICD-10-CM | POA: Diagnosis not present

## 2019-11-28 ENCOUNTER — Other Ambulatory Visit: Payer: Self-pay

## 2019-11-28 ENCOUNTER — Encounter: Payer: BC Managed Care – PPO | Attending: Internal Medicine | Admitting: Internal Medicine

## 2019-11-28 DIAGNOSIS — M353 Polymyalgia rheumatica: Secondary | ICD-10-CM | POA: Insufficient documentation

## 2019-11-28 DIAGNOSIS — L89154 Pressure ulcer of sacral region, stage 4: Secondary | ICD-10-CM | POA: Diagnosis not present

## 2019-11-28 DIAGNOSIS — Z96642 Presence of left artificial hip joint: Secondary | ICD-10-CM | POA: Diagnosis not present

## 2019-11-28 DIAGNOSIS — T8131XD Disruption of external operation (surgical) wound, not elsewhere classified, subsequent encounter: Secondary | ICD-10-CM | POA: Insufficient documentation

## 2019-11-28 DIAGNOSIS — K6812 Psoas muscle abscess: Secondary | ICD-10-CM | POA: Diagnosis not present

## 2019-11-28 DIAGNOSIS — T8131XA Disruption of external operation (surgical) wound, not elsewhere classified, initial encounter: Secondary | ICD-10-CM | POA: Diagnosis not present

## 2019-11-28 NOTE — Progress Notes (Signed)
[name], [name] ([name]) Visit Report for 11/28/2019 HPI Details Patient Name: [name], [name] Date of Service: 11/28/2019 10:45 AM Medical Record Number: [name] Patient Account Number: [id number] Date of Birth/Sex: [birth year] (54 y.o. M) Treating RN: [name] Primary Care Provider: [name] Other Clinician: Referring Provider: [name] Treating Provider/Extender: [name] in Treatment: 12 History of Present Illness HPI Description: ADMISSION 09/05/2019 This is a 54 year old man who has a complicated history which started with acute low back pain sometime in September. Saw his primary doctor had a CT scan of the abdomen and pelvis that was normal. By late December he was discovered to have a lumbar epidural abscess with cauda equina syndrome. He was admitted to hospital and had a decompressive laminectomy at L3/L4/L5 blood cultures at the time showed MSSA he received IV nafcillin. He went to rehab from 1/5 through 1/15 there he was noted to have an "pressure injury in the skin" although I did not see much more about this in the discharge instructions. Unfortunately this wound became infected after he was discharged home. He required readmission from 1/25 through 2/2 with sepsis, discovered to have a right psoas abscess as well as an infected decubitus ulcer. He had a bedside debridement by general surgery on 1/25 interventional radiology drain the right psoas abscess. General surgery recommended 3 times daily wet- to-dry dressings and an air mattress. He has since been discharged home. He is mobile with a walker. They are using 3 times daily wet-to-dry dressings. According to his family he is eating well. He has a protein supplement but he he is not taking it. He is currently on ampicillin sulbactam as recommended by [name] this is due to be finished currently on 2/22 although there seem to be some suggestion it might go longer. They say he had blood work 2  days ago which I will review. The last lab work I saw was a sedimentation rate of 81 and a C-reactive protein of 212 on 1/25. I did not see any imaging studies of the underlying bone although I will need to review the CT scans that he has had Past medical history, polymyalgia rheumatica, left total hip replacement in 2018 for avascular necrosis, cellulitis of the scrotum in July 2020, history of alcohol abuse but that is not currently problematic 09/12/2019; patient readmitted to the clinic last week. I put him on a wet-to-dry dressing with silver sorb gel to the larger wound area on his coccyx and proximal right buttocks. These are actually connected. In general the wound surfaces look quite good although they are deep and there is a large amount of undermining. There is no exposed bone. Last lab work on 2/8 that was ordered by [name] showed a CRP of 68.5 and a sedimentation rate of greater than 130. The CRP was elevated versus 18.3 on 1/10 although it is difficult to interpret these necessarily in somebody with polymyalgia rheumatica. His white count was 15.7 hemoglobin 10.7 The patient is offloading this is much as he can says that he is eating and drinking well which seems verified by his family member. 2/24; patient with a large wound on the lower sacrum and proximal right buttocks. There is a bridge of overlying skin but the wounds are connected and there is wide undermining. We are using wet-to-dry dressings. I have reviewed [name] notes of 09/17/2019 he is remove the PICC line. He had a prolonged course of IV antibiotics for the MSSA bacteremia, lumbar infection,  psoas abscess and a necrotic sacral wound. Interestingly I noticed the same history of PMR in [place] link that [name] comments on. I went over the case again with the patient and his daughter he is totally unfamiliar with anything to do with polymyalgia rheumatica or its obvious symptoms that I also described. I  told him that this is not a diagnosis that most doctors would make and put in an E HR without documentation but he is just not familiar with it and neither his his daughter. For now we are using wet-to-dry dressings. I think it is time to try a wound VAC now that we are certain about underlying infection issues 3/3; he arrives today with his wound actually looks somewhat better. Two small areas with a bridge of skin actually have contracted I believe there is less depth. There is still extensive undermining. He got his VAC machine yesterday but they have still not put it on. 3/10; the orifice of the actual wound is contracting faster than the undermining dimensions of the wound. This is probably soon going to make this difficult to continue with the [facility] 10/15/2019 upon evaluation today patient appears to be doing well in general with regard to his wound. Apparently this looks good although I have not really seen him he has been seen by [name] up to this point. Nonetheless he is currently on a wound VAC and apparently has been having some odor over the past several days which is why comes in for evaluation earlier today. I am going obtain a wound culture and then subsequently we will likely place the patient on an antibiotic at this point to try to help out with the odor which I presume is coming from a bacterial infection. The patient has been off of all antibiotics for the past month having ended on 22 February. With that being said he is not having any increased pain or anything else going on at this point which is good news. He is not allergic to any antibiotics that he is aware of. 4/7; it has been almost a month since I saw this patient although he was here 2 weeks ago. He had a wound VAC on this wound. I note that he was treated for infection with 2 weeks of Levaquin. We had a report recently from home health that there was an extensive rash apparently in the periwound and they took the [facility]  off for a few days however the rash appears to have cleaned up and the wound VAC was back on on his presentation today. Since I saw this last the year is been considerable reduction in overall wound area 4/21; 2-week follow-up. Still using the wound VAC although I am not completely certain that [place] home health is putting this on continuously per discussion with the patient's family. He still has the 2 connecting wounds. The larger area is more midline the smaller area to the right. There is no undermining beyond the smaller wound that I was able to determine. However the larger wound slopes towards that area. We have some improvement in the overall wound measurements still using the wound VAC. 5/5; 2-week follow-up. We held the wound VAC last week largely as the wound and closed down. There is still 2 open areas the more midline area and the area towards the right separated by normal skin. We are using silver alginate Electronic Signature(s) Sollenberger, JAHCERE DERR ([id number]) Signed: 11/28/2019 4:28:02 PM By: Linton Ham MD Entered By: [name],  Americo Vallery on 11/28/2019 11:53:32 Limbach, [name] ([name]) -------------------------------------------------------------------------------- Physical Exam Details Patient Name: [name], [name] Date of Service: 11/28/2019 10:45 AM Medical Record Number: [name] Patient Account Number: [id number] Date of Birth/Sex: 03/19/1966 (54 y.o. M) Treating RN: [name] Primary Care Provider: [name] Other Clinician: Referring Provider: [name] Treating Provider/Extender: [name] in Treatment: 12 Integumentary (Hair, Skin) No erythema around the wound. Notes Wound exam; the wound has certainly contracted. 2 open areas which are still connected. The smaller area in the right buttock has a small area of depth that connects to the base of the original wound the larger main area undermines the smaller area. All of the tissue looks healthy  here there is no evidence of surrounding infection. No palpable bone no purulent drainage Electronic Signature(s) Signed: 11/28/2019 4:28:02 PM By: Linton Ham MD Entered By: Linton Ham on 11/28/2019 11:54:22 [name], [name] ([name]) -------------------------------------------------------------------------------- Physician Orders Details Patient Name: [name], [name] Date of Service: 11/28/2019 10:45 AM Medical Record Number: [name] Patient Account Number: [id number] Date of Birth/Sex: 09/17/1965 (54 y.o. M) Treating RN: Army Melia Primary Care Provider: [name] Other Clinician: Referring Provider: [name] Treating Provider/Extender: [name] in Treatment: 12 Verbal / Phone Orders: No Diagnosis Coding Wound Cleansing Wound #1 Midline Sacrum o Clean wound with Normal Saline. Anesthetic (add to Medication List) Wound #1 Midline Sacrum o Topical Lidocaine 4% cream applied to wound bed prior to debridement (In Clinic Only). Skin Barriers/Peri-Wound Care Wound #1 Midline Sacrum o Skin Prep Primary Wound Dressing Wound #1 Midline Sacrum o Other: - Silver alginate packing (in entire open space) Secondary Dressing Wound #1 Midline Sacrum o Boardered Foam Dressing Dressing Change Frequency Wound #1 Midline Sacrum o Change dressing every day. - Patient's daughter to change on days home heath does not come out. o Change Dressing Monday, Wednesday, Friday - Homeheath Follow-up Appointments Wound #1 Midline Sacrum o Return Appointment in 2 weeks. Off-Loading Wound #1 Midline Sacrum o Other: - NO Pressure on the wounded areas. Additional Orders / Instructions Wound #1 Midline Sacrum o Increase protein intake. - Powder protein supplement, multivitamin o Activity as tolerated Home Health Wound #1 Midline [place] Visits - [employer]-order supplies for patients daughter to be able to change on days you do  not come out [facility] Nurse may visit PRN to address patientos wound care needs. o FACE TO FACE ENCOUNTER: MEDICARE and MEDICAID PATIENTS: I certify that this patient is under my care and that I had a face-to-face encounter that meets the physician face-to-face encounter requirements with this patient on this date. The encounter with the patient was in whole or in part for the following MEDICAL CONDITION: (primary reason for [place]) MEDICAL NECESSITY: I certify, that based on my findings, NURSING services are a medically necessary home health service. HOME BOUND STATUS: I certify that my clinical findings support that this patient is homebound (i.e., Due to illness or injury, pt requires aid of supportive devices such as crutches, cane, wheelchairs, walkers, the use of special transportation or the assistance of another person to leave their place of residence. There is a normal inability to leave the [place], JAMAHL MCNEMAR. ([name]) home and doing so requires considerable and taxing effort. Other absences are for medical reasons / religious services and are infrequent or of short duration when for other reasons). o If current dressing causes regression in wound condition, may D/C ordered dressing product/s and apply Normal Saline  Moist Dressing daily until next [place] / Other MD appointment. [place] of regression in wound condition at [phone]. o Please direct any NON-WOUND related issues/requests for orders to patient's Primary Care Physician Negative Pressure Wound Therapy Wound #1 Midline Sacrum o Discontinue NPWT. Electronic Signature(s) Signed: 11/28/2019 2:57:49 PM By: Army Melia Signed: 11/28/2019 4:28:02 PM By: Linton Ham MD Entered By: Army Melia on 11/28/2019 11:06:15 [name], [name] ([id number]) -------------------------------------------------------------------------------- Problem List Details Patient Name: [name],  [name] Date of Service: 11/28/2019 10:45 AM Medical Record Number: [id number] Patient Account Number: [id number] Date of Birth/Sex: [birth year] (54 y.o. M) Treating RN: [name] Primary Care Provider: [name] Other Clinician: Referring Provider: [name] Treating Provider/Extender: [name] in Treatment: 12 Active Problems ICD-10 Encounter Code Description Active Date MDM Diagnosis L89.154 Pressure ulcer of sacral region, stage 4 09/05/2019 No Yes T81.31XD Disruption of external operation (surgical) wound, not elsewhere 09/05/2019 No Yes classified, subsequent encounter L02.31 Cutaneous abscess of buttock 09/05/2019 No Yes Inactive Problems Resolved Problems Electronic Signature(s) Signed: 11/28/2019 4:28:02 PM By: Linton Ham MD Entered By: Linton Ham on 11/28/2019 11:49:09 [name], [name] ([id number]) -------------------------------------------------------------------------------- Progress Note Details Patient Name: [name], [name] Date of Service: 11/28/2019 10:45 AM Medical Record Number: [id number] Patient Account Number: [id number] Date of Birth/Sex: 03/26/1966 (54 y.o. M) Treating RN: [name] Primary Care Provider: [name] Other Clinician: Referring Provider: [name] Treating Provider/Extender: [name] in Treatment: 12 Subjective History of Present Illness (HPI) ADMISSION 09/05/2019 This is a 54 year old man who has a complicated history which started with acute low back pain sometime in September. Saw his primary doctor had a CT scan of the abdomen and pelvis that was normal. By late December he was discovered to have a lumbar epidural abscess with cauda equina syndrome. He was admitted to hospital and had a decompressive laminectomy at L3/L4/L5 blood cultures at the time showed MSSA he received IV nafcillin. He went to rehab from 1/5 through 1/15 there he was noted to have an "pressure injury in the skin" although  I did not see much more about this in the discharge instructions. Unfortunately this wound became infected after he was discharged home. He required readmission from 1/25 through 2/2 with sepsis, discovered to have a right psoas abscess as well as an infected decubitus ulcer. He had a bedside debridement by general surgery on 1/25 interventional radiology drain the right psoas abscess. General surgery recommended 3 times daily wet- to-dry dressings and an air mattress. He has since been discharged home. He is mobile with a walker. They are using 3 times daily wet-to-dry dressings. According to his family he is eating well. He has a protein supplement but he he is not taking it. He is currently on ampicillin sulbactam as recommended by [name] this is due to be finished currently on 2/22 although there seem to be some suggestion it might go longer. They say he had blood work 2 days ago which I will review. The last lab work I saw was a sedimentation rate of 81 and a C-reactive protein of 212 on 1/25. I did not see any imaging studies of the underlying bone although I will need to review the CT scans that he has had Past medical history, polymyalgia rheumatica, left total hip replacement in 2018 for avascular necrosis, cellulitis of the scrotum in July 2020, history of alcohol abuse but that is not currently problematic 09/12/2019; patient readmitted to the clinic last  week. I put him on a wet-to-dry dressing with silver sorb gel to the larger wound area on his coccyx and proximal right buttocks. These are actually connected. In general the wound surfaces look quite good although they are deep and there is a large amount of undermining. There is no exposed bone. Last lab work on 2/8 that was ordered by [name] showed a CRP of 68.5 and a sedimentation rate of greater than 130. The CRP was elevated versus 18.3 on 1/10 although it is difficult to interpret these necessarily in somebody with  polymyalgia rheumatica. His white count was 15.7 hemoglobin 10.7 The patient is offloading this is much as he can says that he is eating and drinking well which seems verified by his family member. 2/24; patient with a large wound on the lower sacrum and proximal right buttocks. There is a bridge of overlying skin but the wounds are connected and there is wide undermining. We are using wet-to-dry dressings. I have reviewed [name] notes of 09/17/2019 he is remove the PICC line. He had a prolonged course of IV antibiotics for the MSSA bacteremia, lumbar infection, psoas abscess and a necrotic sacral wound. Interestingly I noticed the same history of PMR in [place] link that [name] comments on. I went over the case again with the patient and his daughter he is totally unfamiliar with anything to do with polymyalgia rheumatica or its obvious symptoms that I also described. I told him that this is not a diagnosis that most doctors would make and put in an E HR without documentation but he is just not familiar with it and neither his his daughter. For now we are using wet-to-dry dressings. I think it is time to try a wound VAC now that we are certain about underlying infection issues 3/3; he arrives today with his wound actually looks somewhat better. Two small areas with a bridge of skin actually have contracted I believe there is less depth. There is still extensive undermining. He got his VAC machine yesterday but they have still not put it on. 3/10; the orifice of the actual wound is contracting faster than the undermining dimensions of the wound. This is probably soon going to make this difficult to continue with the [facility] 10/15/2019 upon evaluation today patient appears to be doing well in general with regard to his wound. Apparently this looks good although I have not really seen him he has been seen by [name] up to this point. Nonetheless he is currently on a wound VAC and  apparently has been having some odor over the past several days which is why comes in for evaluation earlier today. I am going obtain a wound culture and then subsequently we will likely place the patient on an antibiotic at this point to try to help out with the odor which I presume is coming from a bacterial infection. The patient has been off of all antibiotics for the past month having ended on 22 February. With that being said he is not having any increased pain or anything else going on at this point which is good news. He is not allergic to any antibiotics that he is aware of. 4/7; it has been almost a month since I saw this patient although he was here 2 weeks ago. He had a wound VAC on this wound. I note that he was treated for infection with 2 weeks of Levaquin. We had a report recently from home health that there was  an extensive rash apparently in the periwound and they took the [facility] off for a few days however the rash appears to have cleaned up and the wound VAC was back on on his presentation today. Since I saw this last the year is been considerable reduction in overall wound area 4/21; 2-week follow-up. Still using the wound VAC although I am not completely certain that [place] home health is putting this on continuously per discussion with the patient's family. He still has the 2 connecting wounds. The larger area is more midline the smaller area to the right. There is no undermining beyond the smaller wound that I was able to determine. However the larger wound slopes towards that area. We have some improvement in the overall wound measurements still using the wound VAC. 5/5; 2-week follow-up. We held the wound VAC last week largely as the wound and closed down. There is still 2 open areas the more midline area and the area towards the right separated by normal skin. We are using silver alginate Scaduto, Jihan [name]. ([name]) Objective Constitutional Vitals Time Taken: 10:43 AM,  Height: 72 in, Weight: 230 lbs, BMI: 31.2, Temperature: 98.3 F, Pulse: 78 bpm, Respiratory Rate: 16 breaths/min, Blood Pressure: 112/65 mmHg. General Notes: Wound exam; the wound has certainly contracted. 2 open areas which are still connected. The smaller area in the right buttock has a small area of depth that connects to the base of the original wound the larger main area undermines the smaller area. All of the tissue looks healthy here there is no evidence of surrounding infection. No palpable bone no purulent drainage Integumentary (Hair, Skin) No erythema around the wound. Wound #1 status is Open. Original cause of wound was Gradually Appeared. The wound is located on the Midline Sacrum. The wound measures 1cm length x 2.3cm width x 2cm depth; 1.806cm^2 area and 3.613cm^3 volume. There is Fat Layer (Subcutaneous Tissue) Exposed exposed. There is no tunneling or undermining noted. There is a medium amount of serosanguineous drainage noted. Foul odor after cleansing was noted. The wound margin is thickened. There is large (67-100%) red, hyper - granulation within the wound bed. There is a small (1-33%) amount of necrotic tissue within the wound bed including Adherent Slough. Assessment Active Problems ICD-10 Pressure ulcer of sacral region, stage 4 Disruption of external operation (surgical) wound, not elsewhere classified, subsequent encounter Cutaneous abscess of buttock Plan Wound Cleansing: Wound #1 Midline Sacrum: Clean wound with Normal Saline. Anesthetic (add to Medication List): Wound #1 Midline Sacrum: Topical Lidocaine 4% cream applied to wound bed prior to debridement (In Clinic Only). Skin Barriers/Peri-Wound Care: Wound #1 Midline Sacrum: Skin Prep Primary Wound Dressing: Wound #1 Midline Sacrum: Other: - Silver alginate packing (in entire open space) Secondary Dressing: Wound #1 Midline Sacrum: Boardered Foam Dressing Dressing Change Frequency: Wound #1 Midline  Sacrum: Change dressing every day. - Patient's daughter to change on days home heath does not come out. Change Dressing Monday, Wednesday, Friday - Homeheath Follow-up Appointments: Wound #1 Midline Sacrum: Return Appointment in 2 weeks. Off-Loading: Wound #1 Midline Sacrum: Other: - NO Pressure on the wounded areas. Additional Orders / Instructions: Wound #1 Midline Sacrum: Increase protein intake. - Powder protein supplement, multivitamin Activity as tolerated Home Health: Wound #1 Midline Sacrum: [place] Visits - [employer]-order supplies for patients daughter to be able to change on days you do not come out Home Health Nurse may visit PRN to address patient s wound care needs. FACE TO FACE ENCOUNTER: MEDICARE  and MEDICAID PATIENTS: I certify that this patient is under my care and that I had a face-to-face Beal, CHANG DONALD. ([name]) encounter that meets the physician face-to-face encounter requirements with this patient on this date. The encounter with the patient was in whole or in part for the following MEDICAL CONDITION: (primary reason for [place]) MEDICAL NECESSITY: I certify, that based on my findings, NURSING services are a medically necessary home health service. HOME BOUND STATUS: I certify that my clinical findings support that this patient is homebound (i.e., Due to illness or injury, pt requires aid of supportive devices such as crutches, cane, wheelchairs, walkers, the use of special transportation or the assistance of another person to leave their place of residence. There is a normal inability to leave the home and doing so requires considerable and taxing effort. Other absences are for medical reasons / religious services and are infrequent or of short duration when for other reasons). If current dressing causes regression in wound condition, may D/C ordered dressing product/s and apply Normal Saline Moist Dressing daily until next [place]  / Other MD appointment. [place] of regression in wound condition at [phone]. Please direct any NON-WOUND related issues/requests for orders to patient's Primary Care Physician Negative Pressure Wound Therapy: Wound #1 Midline Sacrum: Discontinue NPWT. 1. I am continuing using silver alginate. Need to be careful to pack this from the major wound to the satellite area and then layer over both wound areas. On the small recess to the right this needs to be packed down into the wound bed carefully. 2. Careful attention to the dimensions and configuration next time 3. I cannot imagine a scenario at this point where the wound VAC is going to be needed I think this can be returned and formally discontinued Electronic Signature(s) Signed: 11/28/2019 4:28:02 PM By: Linton Ham MD Entered By: Linton Ham on 11/28/2019 11:55:24 Chawla, [name] ([name]) -------------------------------------------------------------------------------- [place] Details Patient Name: Szumski, [name] Date of Service: 11/28/2019 Medical Record Number: [name] Patient Account Number: [id number] Date of Birth/Sex: 08/02/1965 (54 y.o. M) Treating RN: [name] Primary Care Provider: [name] Other Clinician: Referring Provider: [name] Treating Provider/Extender: [name] in Treatment: 12 Diagnosis Coding ICD-10 Codes Code Description L89.154 Pressure ulcer of sacral region, stage 4 T81.31XD Disruption of external operation (surgical) wound, not elsewhere classified, subsequent encounter L02.31 Cutaneous abscess of buttock Facility Procedures CPT4 Code: [id number] Description: 99213 - WOUND CARE VISIT-LEV 3 EST PT Modifier: Quantity: 1 Physician Procedures CPT4 Code Description: [id number] - WC PHYS LEVEL 3 - EST PT Modifier: Quantity: 1 CPT4 Code Description: ICD-10 Diagnosis Description L89.154 Pressure ulcer of sacral region, stage 4 T81.31XD  Disruption of external operation (surgical) wound, not elsewhere classi Modifier: fied, subsequent enco Quantity: Personal assistant) Signed: 11/28/2019 4:28:02 PM By: Linton Ham MD Entered By: Linton Ham on 11/28/2019 11:55:47

## 2019-11-28 NOTE — Progress Notes (Signed)
Meints, [name] ([id number]) Visit Report for 11/28/2019 Arrival Information Details Patient Name: [name], [name] Date of Service: 11/28/2019 10:45 AM Medical Record Number: [id number] Patient Account Number: [id number] Date of Birth/Sex: 09/03/1965 (54 y.o. M) Treating RN: Montey Hora Primary Care Blaire Hodsdon: [name] Other Clinician: Referring Dayan Desa: [name] Treating Rosaleen Mazer/Extender: [name] in Treatment: 12 Visit Information History Since Last Visit Added or deleted any medications: No Patient Arrived: Ambulatory Any new allergies or adverse reactions: No Arrival Time: 10:43 Had a fall or experienced change in No Accompanied By: daughter activities of daily living that may affect Transfer Assistance: None risk of falls: Patient Identification Verified: Yes Signs or symptoms of abuse/neglect since last visito No Secondary Verification Process Completed: Yes Hospitalized since last visit: No Implantable device outside of the clinic excluding No cellular tissue based products placed in the center since last visit: Has Dressing in Place as Prescribed: Yes Pain Present Now: No Electronic Signature(s) Signed: 11/28/2019 4:27:51 PM By: Montey Hora Entered By: Montey Hora on 11/28/2019 10:44:47 Salmons, Candelaria [name] ([id number]) -------------------------------------------------------------------------------- Clinic Level of Care Assessment Details Patient Name: [name] Date of Service: 11/28/2019 10:45 AM Medical Record Number: [id number] Patient Account Number: [id number] Date of Birth/Sex: 04/21/1966 (54 y.o. M) Treating RN: Army Melia Primary Care Mihail Prettyman: [name] Other Clinician: Referring Aili Casillas: [name] Treating Jaliyah Fotheringham/Extender: [name] in Treatment: 12 Clinic Level of Care Assessment Items TOOL 4 Quantity Score []  - Use when only an EandM is performed on FOLLOW-UP visit 0 ASSESSMENTS - Nursing Assessment  / Reassessment X - Reassessment of Co-morbidities (includes updates in patient status) 1 10 X- 1 5 Reassessment of Adherence to Treatment Plan ASSESSMENTS - Wound and Skin Assessment / Reassessment X - Simple Wound Assessment / Reassessment - one wound 1 5 []  - 0 Complex Wound Assessment / Reassessment - multiple wounds []  - 0 Dermatologic / Skin Assessment (not related to wound area) ASSESSMENTS - Focused Assessment []  - Circumferential Edema Measurements - multi extremities 0 []  - 0 Nutritional Assessment / Counseling / Intervention []  - 0 Lower Extremity Assessment (monofilament, tuning fork, pulses) []  - 0 Peripheral Arterial Disease Assessment (using hand held doppler) ASSESSMENTS - Ostomy and/or Continence Assessment and Care []  - Incontinence Assessment and Management 0 []  - 0 Ostomy Care Assessment and Management (repouching, etc.) PROCESS - Coordination of Care X - Simple Patient / Family Education for ongoing care 1 15 []  - 0 Complex (extensive) Patient / Family Education for ongoing care []  - 0 Staff obtains Programmer, systems, Records, Test Results / Process Orders []  - 0 Staff telephones HHA, Nursing Homes / Clarify orders / etc []  - 0 Routine Transfer to another Facility (non-emergent condition) []  - 0 Routine Hospital Admission (non-emergent condition) []  - 0 New Admissions / Biomedical engineer / Ordering NPWT, Apligraf, etc. []  - 0 Emergency Hospital Admission (emergent condition) X- 1 10 Simple Discharge Coordination []  - 0 Complex (extensive) Discharge Coordination PROCESS - Special Needs []  - Pediatric / Minor Patient Management 0 []  - 0 Isolation Patient Management []  - 0 Hearing / Language / Visual special needs []  - 0 Assessment of Community assistance (transportation, D/C planning, etc.) []  - 0 Additional assistance / Altered mentation []  - 0 Support Surface(s) Assessment (bed, cushion, seat, etc.) INTERVENTIONS - Wound Cleansing /  Measurement [name], [name] ([id number]) X- 1 5 Simple Wound Cleansing - one wound []  - 0 Complex Wound Cleansing - multiple wounds X- 1 5 Wound Imaging (photographs -  any number of wounds) []  - 0 Wound Tracing (instead of photographs) X- 1 5 Simple Wound Measurement - one wound []  - 0 Complex Wound Measurement - multiple wounds INTERVENTIONS - Wound Dressings []  - Small Wound Dressing one or multiple wounds 0 X- 1 15 Medium Wound Dressing one or multiple wounds []  - 0 Large Wound Dressing one or multiple wounds []  - 0 Application of Medications - topical []  - 0 Application of Medications - injection INTERVENTIONS - Miscellaneous []  - External ear exam 0 []  - 0 Specimen Collection (cultures, biopsies, blood, body fluids, etc.) []  - 0 Specimen(s) / Culture(s) sent or taken to Lab for analysis []  - 0 Patient Transfer (multiple staff / Civil Service fast streamer / Similar devices) []  - 0 Simple Staple / Suture removal (25 or less) []  - 0 Complex Staple / Suture removal (26 or more) []  - 0 Hypo / Hyperglycemic Management (close monitor of Blood Glucose) []  - 0 Ankle / Brachial Index (ABI) - do not check if billed separately X- 1 5 Vital Signs Has the patient been seen at the hospital within the last three years: Yes Total Score: 80 Level Of Care: New/Established - Level 3 Electronic Signature(s) Signed: 11/28/2019 2:57:49 PM By: Army Melia Entered By: Army Melia on 11/28/2019 11:07:13 Speciale, Candelaria [name] ([id number]) -------------------------------------------------------------------------------- Encounter Discharge Information Details Patient Name: [name], [name] Date of Service: 11/28/2019 10:45 AM Medical Record Number: [id number] Patient Account Number: [id number] Date of Birth/Sex: October 25, 1965 (54 y.o. M) Treating RN: Army Melia Primary Care Chenell Lozon: [name] Other Clinician: Referring Adriene Knipfer: [name] Treating Dymond Spreen/Extender: [name] in  Treatment: 12 Encounter Discharge Information Items Discharge Condition: Stable Ambulatory Status: Ambulatory Discharge Destination: Home Transportation: Private Auto Accompanied By: daughter Schedule Follow-up Appointment: Yes Clinical Summary of Care: Electronic Signature(s) Signed: 11/28/2019 2:57:49 PM By: Army Melia Entered By: Army Melia on 11/28/2019 11:07:50 Kron, Candelaria [name] ([id number]) -------------------------------------------------------------------------------- Lower Extremity Assessment Details Patient Name: Chalfant, Candelaria [name]. Date of Service: 11/28/2019 10:45 AM Medical Record Number: [id number] Patient Account Number: [id number] Date of Birth/Sex: 1965-08-04 (54 y.o. M) Treating RN: Montey Hora Primary Care Deivi Huckins: [name] Other Clinician: Referring Kristien Salatino: [name] Treating Jeanpierre Thebeau/Extender: Ricard Dillon Weeks in Treatment: 12 Electronic Signature(s) Signed: 11/28/2019 4:27:51 PM By: Montey Hora Entered By: Montey Hora on 11/28/2019 10:44:56 Wahlen, Candelaria [name] ([id number]) -------------------------------------------------------------------------------- Multi Wound Chart Details Patient Name: Lisenbee, [name] Date of Service: 11/28/2019 10:45 AM Medical Record Number: [id number] Patient Account Number: [id number] Date of Birth/Sex: [birth year] (54 y.o. M) Treating RN: Army Melia Primary Care Karla Vines: [name] Other Clinician: Referring Kamdon Reisig: [name] Treating [place] Mosco/Extender: [name] in Treatment: 12 Vital Signs Height(in): 72 Pulse(bpm): 54 Weight(lbs): 230 Blood Pressure(mmHg): 112/65 Body Mass Index(BMI): 31 Temperature(F): 98.3 Respiratory Rate(breaths/min): 16 Photos: [N/A:N/A] Wound Location: Midline Sacrum N/A N/A Wounding Event: Gradually Appeared N/A N/A Primary Etiology: Abscess N/A N/A Date Acquired: 07/24/2019 N/A N/A Weeks of Treatment: 12 N/A N/A Wound Status: Open N/A N/A Measurements  L x W x D (cm) 1x2.3x2 N/A N/A Area (cm) : 1.806 N/A N/A Volume (cm) : 3.613 N/A N/A % Reduction in Area: 78.20% N/A N/A % Reduction in Volume: 90.30% N/A N/A Classification: Full Thickness With Exposed N/A N/A Support Structures Exudate Amount: Medium N/A N/A Exudate Type: Serosanguineous N/A N/A Exudate Color: red, brown N/A N/A Foul Odor After Cleansing: Yes N/A N/A Odor Anticipated Due to Product No N/A N/A Use: Wound Margin: Thickened N/A N/A Granulation Amount: Large (67-100%) N/A  N/A Granulation Quality: Red, Hyper-granulation N/A N/A Necrotic Amount: Small (1-33%) N/A N/A Exposed Structures: Fat Layer (Subcutaneous Tissue) N/A N/A Exposed: Yes Fascia: No Tendon: No Muscle: No Joint: No Bone: No Epithelialization: None N/A N/A Treatment Notes Wound #1 (Midline Sacrum) Notes Silver alginate packed lightly into entire open space, secured with bordered foam dressing. Cabell, NALIN MUNDINE ([id number]) Electronic Signature(s) Signed: 11/28/2019 4:28:02 PM By: Linton Ham MD Entered By: Linton Ham on 11/28/2019 11:49:16 Stawicki, Candelaria [name] ([id number]) -------------------------------------------------------------------------------- [place] Details Patient Name: Dion, JEISON GLAZEWSKI. Date of Service: 11/28/2019 10:45 AM Medical Record Number: [id number] Patient Account Number: [id number] Date of Birth/Sex: 03/05/1966 (55 y.o. M) Treating RN: Army Melia Primary Care Bryann Gentz: [name] Other Clinician: Referring Emalie Mcwethy: [name] Treating Ann Bohne/Extender: [name] in Treatment: 12 Active Inactive Necrotic Tissue Nursing Diagnoses: Impaired tissue integrity related to necrotic/devitalized tissue Goals: Necrotic/devitalized tissue will be minimized in the wound bed Date Initiated: 09/05/2019 Target Resolution Date: 09/26/2019 Goal Status: Active Interventions: Assess patient pain level pre-, during and post procedure and prior to  discharge Treatment Activities: Apply topical anesthetic as ordered : 09/05/2019 Notes: Orientation to the Wound Care Program Nursing Diagnoses: Knowledge deficit related to the wound healing center program Goals: Patient/caregiver will verbalize understanding of the [place] Date Initiated: 09/05/2019 Target Resolution Date: 09/26/2019 Goal Status: Active Interventions: Provide education on orientation to the wound center Notes: Pressure Nursing Diagnoses: Knowledge deficit related to management of pressures ulcers Goals: Patient will remain free from development of additional pressure ulcers Date Initiated: 09/05/2019 Target Resolution Date: 10/03/2019 Goal Status: Active Patient/caregiver will verbalize risk factors for pressure ulcer development Date Initiated: 09/05/2019 Target Resolution Date: 09/26/2019 Goal Status: Active Patient/caregiver will verbalize understanding of pressure ulcer management Date Initiated: 09/05/2019 Target Resolution Date: 10/03/2019 Goal Status: Active Interventions: Assess: immobility, friction, shearing, incontinence upon admission and as needed Provide education on pressure ulcers Notes: GARNERMehkai, Isaguirre ([id number]) Wound/Skin Impairment Nursing Diagnoses: Impaired tissue integrity Goals: Patient/caregiver will verbalize understanding of skin care regimen Date Initiated: 09/05/2019 Target Resolution Date: 09/26/2019 Goal Status: Active Ulcer/skin breakdown will have a volume reduction of 30% by week 4 Date Initiated: 09/05/2019 Target Resolution Date: 10/03/2019 Goal Status: Active Interventions: Assess ulceration(s) every visit Treatment Activities: Referred to DME Alejandra Hunt for dressing supplies : 09/05/2019 Notes: Electronic Signature(s) Signed: 11/28/2019 2:57:49 PM By: Army Melia Entered By: Army Melia on 11/28/2019 11:03:00 Caton, Candelaria [name]  ([id number]) -------------------------------------------------------------------------------- Pain Assessment Details Patient Name: Cordts, Candelaria [name]. Date of Service: 11/28/2019 10:45 AM Medical Record Number: [id number] Patient Account Number: [id number] Date of Birth/Sex: 05-Jan-1966 (54 y.o. M) Treating RN: Montey Hora Primary Care Janet Decesare: [name] Other Clinician: Referring Humza Tallerico: [name] Treating Catlynn Grondahl/Extender: [name] in Treatment: 12 Active Problems Location of Pain Severity and Description of Pain Patient Has Paino Yes Site Locations Pain Location: Pain in Ulcers Pain Management and Medication Current Pain Management: Electronic Signature(s) Signed: 11/28/2019 4:27:51 PM By: Montey Hora Entered By: Montey Hora on 11/28/2019 10:45:08 Thielke, Candelaria [name] ([id number]) -------------------------------------------------------------------------------- Patient/Caregiver Education Details Patient Name: Paradis, Candelaria [name]. Date of Service: 11/28/2019 10:45 AM Medical Record Number: [id number] Patient Account Number: [id number] Date of Birth/Gender: [birth year] (54 y.o. M) Treating RN: Army Melia Primary Care Physician: [name] Other Clinician: Referring Physician: [name] Treating Physician/Extender: [name] in Treatment: 12 Education Assessment Education Provided To: Patient Education Topics Provided Wound/Skin Impairment: Handouts: Caring for Your Ulcer Methods: Demonstration, Explain/Verbal Responses: State content correctly Electronic Signature(s)  Signed: 11/28/2019 2:57:49 PM By: Army Melia Entered By: Army Melia on 11/28/2019 11:07:25 Betts, Candelaria [name] ([id number]) -------------------------------------------------------------------------------- Wound Assessment Details Patient Name: Gleghorn, [name] Date of Service: 11/28/2019 10:45 AM Medical Record Number: [id number] Patient Account Number: [id number] Date of  Birth/Sex: 01-05-1966 (54 y.o. M) Treating RN: Montey Hora Primary Care Joelynn Dust: [name] Other Clinician: Referring Konner Warrior: [name] Treating Kathleen Tamm/Extender: [name] in Treatment: 12 Wound Status Wound Number: 1 Primary Etiology: Abscess Wound Location: Midline Sacrum Wound Status: Open Wounding Event: Gradually Appeared Date Acquired: 07/24/2019 Weeks Of Treatment: 12 Clustered Wound: No Photos Wound Measurements Length: (cm) 1 Width: (cm) 2.3 Depth: (cm) 2 Area: (cm) 1.806 Volume: (cm) 3.613 % Reduction in Area: 78.2% % Reduction in Volume: 90.3% Epithelialization: None Tunneling: No Undermining: No Wound Description Classification: Full Thickness With Exposed Support Structures Wound Margin: Thickened Exudate Amount: Medium Exudate Type: Serosanguineous Exudate Color: red, brown Foul Odor After Cleansing: Yes Due to Product Use: No Slough/Fibrino No Wound Bed Granulation Amount: Large (67-100%) Exposed Structure Granulation Quality: Red, Hyper-granulation Fascia Exposed: No Necrotic Amount: Small (1-33%) Fat Layer (Subcutaneous Tissue) Exposed: Yes Necrotic Quality: Adherent Slough Tendon Exposed: No Muscle Exposed: No Joint Exposed: No Bone Exposed: No Treatment Notes Wound #1 (Midline Sacrum) Notes Silver alginate packed lightly into entire open space, secured with bordered foam dressing. Electronic Signature(s) Signed: 11/28/2019 4:27:51 PM By: Montey Hora Rob[place]am, Candelaria [name] ([id number]) Entered By: Montey Hora on 11/28/2019 10:52:45 [place], Candelaria [name] ([id number]) -------------------------------------------------------------------------------- Vitals Details Patient Name: Diiorio, Candelaria [name]. Date of Service: 11/28/2019 10:45 AM Medical Record Number: [id number] Patient Account Number: [id number] Date of Birth/Sex: [birth year] (53 y.o. M) Treating RN: Montey Hora Primary Care Caral Whan: [name] Other  Clinician: Referring Anaeli Cornwall: [name] Treating Alacia Rehmann/Extender: [name] in Treatment: 12 Vital Signs Time Taken: 10:43 Temperature (F): 98.3 Height (in): 72 Pulse (bpm): 78 Weight (lbs): 230 Respiratory Rate (breaths/min): 16 Body Mass Index (BMI): 31.2 Blood Pressure (mmHg): 112/65 Reference Range: 80 - 120 mg / dl Electronic Signature(s) Signed: 11/28/2019 4:27:51 PM By: Montey Hora Entered By: Montey Hora on 11/28/2019 10:46:15

## 2019-11-29 DIAGNOSIS — I081 Rheumatic disorders of both mitral and tricuspid valves: Secondary | ICD-10-CM | POA: Diagnosis not present

## 2019-11-29 DIAGNOSIS — M47816 Spondylosis without myelopathy or radiculopathy, lumbar region: Secondary | ICD-10-CM | POA: Diagnosis not present

## 2019-11-29 DIAGNOSIS — M4316 Spondylolisthesis, lumbar region: Secondary | ICD-10-CM | POA: Diagnosis not present

## 2019-11-29 DIAGNOSIS — J9811 Atelectasis: Secondary | ICD-10-CM | POA: Diagnosis not present

## 2019-11-29 DIAGNOSIS — M4646 Discitis, unspecified, lumbar region: Secondary | ICD-10-CM | POA: Diagnosis not present

## 2019-11-29 DIAGNOSIS — M4804 Spinal stenosis, thoracic region: Secondary | ICD-10-CM | POA: Diagnosis not present

## 2019-11-29 DIAGNOSIS — L89312 Pressure ulcer of right buttock, stage 2: Secondary | ICD-10-CM | POA: Diagnosis not present

## 2019-11-29 DIAGNOSIS — G4700 Insomnia, unspecified: Secondary | ICD-10-CM | POA: Diagnosis not present

## 2019-11-29 DIAGNOSIS — G834 Cauda equina syndrome: Secondary | ICD-10-CM | POA: Diagnosis not present

## 2019-11-29 DIAGNOSIS — D72829 Elevated white blood cell count, unspecified: Secondary | ICD-10-CM | POA: Diagnosis not present

## 2019-11-29 DIAGNOSIS — M4604 Spinal enthesopathy, thoracic region: Secondary | ICD-10-CM | POA: Diagnosis not present

## 2019-11-29 DIAGNOSIS — L89154 Pressure ulcer of sacral region, stage 4: Secondary | ICD-10-CM | POA: Diagnosis not present

## 2019-11-29 DIAGNOSIS — M40204 Unspecified kyphosis, thoracic region: Secondary | ICD-10-CM | POA: Diagnosis not present

## 2019-11-29 DIAGNOSIS — M48061 Spinal stenosis, lumbar region without neurogenic claudication: Secondary | ICD-10-CM | POA: Diagnosis not present

## 2019-11-29 DIAGNOSIS — M5144 Schmorl's nodes, thoracic region: Secondary | ICD-10-CM | POA: Diagnosis not present

## 2019-11-29 DIAGNOSIS — N319 Neuromuscular dysfunction of bladder, unspecified: Secondary | ICD-10-CM | POA: Diagnosis not present

## 2019-11-29 DIAGNOSIS — L89894 Pressure ulcer of other site, stage 4: Secondary | ICD-10-CM | POA: Diagnosis not present

## 2019-11-30 DIAGNOSIS — M4646 Discitis, unspecified, lumbar region: Secondary | ICD-10-CM | POA: Diagnosis not present

## 2019-11-30 DIAGNOSIS — L89894 Pressure ulcer of other site, stage 4: Secondary | ICD-10-CM | POA: Diagnosis not present

## 2019-11-30 DIAGNOSIS — D72829 Elevated white blood cell count, unspecified: Secondary | ICD-10-CM | POA: Diagnosis not present

## 2019-11-30 DIAGNOSIS — I081 Rheumatic disorders of both mitral and tricuspid valves: Secondary | ICD-10-CM | POA: Diagnosis not present

## 2019-11-30 DIAGNOSIS — L89312 Pressure ulcer of right buttock, stage 2: Secondary | ICD-10-CM | POA: Diagnosis not present

## 2019-11-30 DIAGNOSIS — M4316 Spondylolisthesis, lumbar region: Secondary | ICD-10-CM | POA: Diagnosis not present

## 2019-11-30 DIAGNOSIS — N319 Neuromuscular dysfunction of bladder, unspecified: Secondary | ICD-10-CM | POA: Diagnosis not present

## 2019-11-30 DIAGNOSIS — J9811 Atelectasis: Secondary | ICD-10-CM | POA: Diagnosis not present

## 2019-11-30 DIAGNOSIS — G4700 Insomnia, unspecified: Secondary | ICD-10-CM | POA: Diagnosis not present

## 2019-11-30 DIAGNOSIS — M47816 Spondylosis without myelopathy or radiculopathy, lumbar region: Secondary | ICD-10-CM | POA: Diagnosis not present

## 2019-11-30 DIAGNOSIS — M40204 Unspecified kyphosis, thoracic region: Secondary | ICD-10-CM | POA: Diagnosis not present

## 2019-11-30 DIAGNOSIS — M4604 Spinal enthesopathy, thoracic region: Secondary | ICD-10-CM | POA: Diagnosis not present

## 2019-11-30 DIAGNOSIS — L89154 Pressure ulcer of sacral region, stage 4: Secondary | ICD-10-CM | POA: Diagnosis not present

## 2019-11-30 DIAGNOSIS — M4804 Spinal stenosis, thoracic region: Secondary | ICD-10-CM | POA: Diagnosis not present

## 2019-11-30 DIAGNOSIS — M5144 Schmorl's nodes, thoracic region: Secondary | ICD-10-CM | POA: Diagnosis not present

## 2019-11-30 DIAGNOSIS — M48061 Spinal stenosis, lumbar region without neurogenic claudication: Secondary | ICD-10-CM | POA: Diagnosis not present

## 2019-11-30 DIAGNOSIS — G834 Cauda equina syndrome: Secondary | ICD-10-CM | POA: Diagnosis not present

## 2019-12-01 DIAGNOSIS — L89154 Pressure ulcer of sacral region, stage 4: Secondary | ICD-10-CM | POA: Diagnosis not present

## 2019-12-02 DIAGNOSIS — L89154 Pressure ulcer of sacral region, stage 4: Secondary | ICD-10-CM | POA: Diagnosis not present

## 2019-12-03 DIAGNOSIS — L89312 Pressure ulcer of right buttock, stage 2: Secondary | ICD-10-CM | POA: Diagnosis not present

## 2019-12-03 DIAGNOSIS — M47816 Spondylosis without myelopathy or radiculopathy, lumbar region: Secondary | ICD-10-CM | POA: Diagnosis not present

## 2019-12-03 DIAGNOSIS — G4700 Insomnia, unspecified: Secondary | ICD-10-CM | POA: Diagnosis not present

## 2019-12-03 DIAGNOSIS — N319 Neuromuscular dysfunction of bladder, unspecified: Secondary | ICD-10-CM | POA: Diagnosis not present

## 2019-12-03 DIAGNOSIS — G834 Cauda equina syndrome: Secondary | ICD-10-CM | POA: Diagnosis not present

## 2019-12-03 DIAGNOSIS — M5144 Schmorl's nodes, thoracic region: Secondary | ICD-10-CM | POA: Diagnosis not present

## 2019-12-03 DIAGNOSIS — D72829 Elevated white blood cell count, unspecified: Secondary | ICD-10-CM | POA: Diagnosis not present

## 2019-12-03 DIAGNOSIS — L89154 Pressure ulcer of sacral region, stage 4: Secondary | ICD-10-CM | POA: Diagnosis not present

## 2019-12-03 DIAGNOSIS — M4604 Spinal enthesopathy, thoracic region: Secondary | ICD-10-CM | POA: Diagnosis not present

## 2019-12-03 DIAGNOSIS — J9811 Atelectasis: Secondary | ICD-10-CM | POA: Diagnosis not present

## 2019-12-03 DIAGNOSIS — M4316 Spondylolisthesis, lumbar region: Secondary | ICD-10-CM | POA: Diagnosis not present

## 2019-12-03 DIAGNOSIS — M40204 Unspecified kyphosis, thoracic region: Secondary | ICD-10-CM | POA: Diagnosis not present

## 2019-12-03 DIAGNOSIS — L89894 Pressure ulcer of other site, stage 4: Secondary | ICD-10-CM | POA: Diagnosis not present

## 2019-12-03 DIAGNOSIS — M48061 Spinal stenosis, lumbar region without neurogenic claudication: Secondary | ICD-10-CM | POA: Diagnosis not present

## 2019-12-03 DIAGNOSIS — M4646 Discitis, unspecified, lumbar region: Secondary | ICD-10-CM | POA: Diagnosis not present

## 2019-12-03 DIAGNOSIS — I081 Rheumatic disorders of both mitral and tricuspid valves: Secondary | ICD-10-CM | POA: Diagnosis not present

## 2019-12-03 DIAGNOSIS — M4804 Spinal stenosis, thoracic region: Secondary | ICD-10-CM | POA: Diagnosis not present

## 2019-12-04 DIAGNOSIS — J9811 Atelectasis: Secondary | ICD-10-CM | POA: Diagnosis not present

## 2019-12-04 DIAGNOSIS — H25042 Posterior subcapsular polar age-related cataract, left eye: Secondary | ICD-10-CM | POA: Diagnosis not present

## 2019-12-04 DIAGNOSIS — M47816 Spondylosis without myelopathy or radiculopathy, lumbar region: Secondary | ICD-10-CM | POA: Diagnosis not present

## 2019-12-04 DIAGNOSIS — I081 Rheumatic disorders of both mitral and tricuspid valves: Secondary | ICD-10-CM | POA: Diagnosis not present

## 2019-12-04 DIAGNOSIS — G834 Cauda equina syndrome: Secondary | ICD-10-CM | POA: Diagnosis not present

## 2019-12-04 DIAGNOSIS — M4804 Spinal stenosis, thoracic region: Secondary | ICD-10-CM | POA: Diagnosis not present

## 2019-12-04 DIAGNOSIS — M4316 Spondylolisthesis, lumbar region: Secondary | ICD-10-CM | POA: Diagnosis not present

## 2019-12-04 DIAGNOSIS — M5144 Schmorl's nodes, thoracic region: Secondary | ICD-10-CM | POA: Diagnosis not present

## 2019-12-04 DIAGNOSIS — N319 Neuromuscular dysfunction of bladder, unspecified: Secondary | ICD-10-CM | POA: Diagnosis not present

## 2019-12-04 DIAGNOSIS — M40204 Unspecified kyphosis, thoracic region: Secondary | ICD-10-CM | POA: Diagnosis not present

## 2019-12-04 DIAGNOSIS — M4604 Spinal enthesopathy, thoracic region: Secondary | ICD-10-CM | POA: Diagnosis not present

## 2019-12-04 DIAGNOSIS — L89894 Pressure ulcer of other site, stage 4: Secondary | ICD-10-CM | POA: Diagnosis not present

## 2019-12-04 DIAGNOSIS — G4700 Insomnia, unspecified: Secondary | ICD-10-CM | POA: Diagnosis not present

## 2019-12-04 DIAGNOSIS — M4646 Discitis, unspecified, lumbar region: Secondary | ICD-10-CM | POA: Diagnosis not present

## 2019-12-04 DIAGNOSIS — L89312 Pressure ulcer of right buttock, stage 2: Secondary | ICD-10-CM | POA: Diagnosis not present

## 2019-12-04 DIAGNOSIS — M48061 Spinal stenosis, lumbar region without neurogenic claudication: Secondary | ICD-10-CM | POA: Diagnosis not present

## 2019-12-04 DIAGNOSIS — D72829 Elevated white blood cell count, unspecified: Secondary | ICD-10-CM | POA: Diagnosis not present

## 2019-12-05 DIAGNOSIS — J9811 Atelectasis: Secondary | ICD-10-CM | POA: Diagnosis not present

## 2019-12-05 DIAGNOSIS — D72829 Elevated white blood cell count, unspecified: Secondary | ICD-10-CM | POA: Diagnosis not present

## 2019-12-05 DIAGNOSIS — M5144 Schmorl's nodes, thoracic region: Secondary | ICD-10-CM | POA: Diagnosis not present

## 2019-12-05 DIAGNOSIS — M40204 Unspecified kyphosis, thoracic region: Secondary | ICD-10-CM | POA: Diagnosis not present

## 2019-12-05 DIAGNOSIS — M4646 Discitis, unspecified, lumbar region: Secondary | ICD-10-CM | POA: Diagnosis not present

## 2019-12-05 DIAGNOSIS — L89304 Pressure ulcer of unspecified buttock, stage 4: Secondary | ICD-10-CM | POA: Diagnosis not present

## 2019-12-05 DIAGNOSIS — L89312 Pressure ulcer of right buttock, stage 2: Secondary | ICD-10-CM | POA: Diagnosis not present

## 2019-12-05 DIAGNOSIS — G834 Cauda equina syndrome: Secondary | ICD-10-CM | POA: Diagnosis not present

## 2019-12-05 DIAGNOSIS — G4700 Insomnia, unspecified: Secondary | ICD-10-CM | POA: Diagnosis not present

## 2019-12-05 DIAGNOSIS — M4804 Spinal stenosis, thoracic region: Secondary | ICD-10-CM | POA: Diagnosis not present

## 2019-12-05 DIAGNOSIS — M47816 Spondylosis without myelopathy or radiculopathy, lumbar region: Secondary | ICD-10-CM | POA: Diagnosis not present

## 2019-12-05 DIAGNOSIS — L89894 Pressure ulcer of other site, stage 4: Secondary | ICD-10-CM | POA: Diagnosis not present

## 2019-12-05 DIAGNOSIS — I081 Rheumatic disorders of both mitral and tricuspid valves: Secondary | ICD-10-CM | POA: Diagnosis not present

## 2019-12-05 DIAGNOSIS — M4316 Spondylolisthesis, lumbar region: Secondary | ICD-10-CM | POA: Diagnosis not present

## 2019-12-05 DIAGNOSIS — M48061 Spinal stenosis, lumbar region without neurogenic claudication: Secondary | ICD-10-CM | POA: Diagnosis not present

## 2019-12-05 DIAGNOSIS — M4604 Spinal enthesopathy, thoracic region: Secondary | ICD-10-CM | POA: Diagnosis not present

## 2019-12-05 DIAGNOSIS — N319 Neuromuscular dysfunction of bladder, unspecified: Secondary | ICD-10-CM | POA: Diagnosis not present

## 2019-12-06 ENCOUNTER — Encounter: Payer: Self-pay | Admitting: Ophthalmology

## 2019-12-07 DIAGNOSIS — N319 Neuromuscular dysfunction of bladder, unspecified: Secondary | ICD-10-CM | POA: Diagnosis not present

## 2019-12-07 DIAGNOSIS — M48061 Spinal stenosis, lumbar region without neurogenic claudication: Secondary | ICD-10-CM | POA: Diagnosis not present

## 2019-12-07 DIAGNOSIS — M4316 Spondylolisthesis, lumbar region: Secondary | ICD-10-CM | POA: Diagnosis not present

## 2019-12-07 DIAGNOSIS — L89894 Pressure ulcer of other site, stage 4: Secondary | ICD-10-CM | POA: Diagnosis not present

## 2019-12-07 DIAGNOSIS — G4700 Insomnia, unspecified: Secondary | ICD-10-CM | POA: Diagnosis not present

## 2019-12-07 DIAGNOSIS — G834 Cauda equina syndrome: Secondary | ICD-10-CM | POA: Diagnosis not present

## 2019-12-07 DIAGNOSIS — D72829 Elevated white blood cell count, unspecified: Secondary | ICD-10-CM | POA: Diagnosis not present

## 2019-12-07 DIAGNOSIS — M5144 Schmorl's nodes, thoracic region: Secondary | ICD-10-CM | POA: Diagnosis not present

## 2019-12-07 DIAGNOSIS — L89312 Pressure ulcer of right buttock, stage 2: Secondary | ICD-10-CM | POA: Diagnosis not present

## 2019-12-07 DIAGNOSIS — I081 Rheumatic disorders of both mitral and tricuspid valves: Secondary | ICD-10-CM | POA: Diagnosis not present

## 2019-12-07 DIAGNOSIS — M4804 Spinal stenosis, thoracic region: Secondary | ICD-10-CM | POA: Diagnosis not present

## 2019-12-07 DIAGNOSIS — M47816 Spondylosis without myelopathy or radiculopathy, lumbar region: Secondary | ICD-10-CM | POA: Diagnosis not present

## 2019-12-07 DIAGNOSIS — M4646 Discitis, unspecified, lumbar region: Secondary | ICD-10-CM | POA: Diagnosis not present

## 2019-12-07 DIAGNOSIS — J9811 Atelectasis: Secondary | ICD-10-CM | POA: Diagnosis not present

## 2019-12-07 DIAGNOSIS — M40204 Unspecified kyphosis, thoracic region: Secondary | ICD-10-CM | POA: Diagnosis not present

## 2019-12-07 DIAGNOSIS — M4604 Spinal enthesopathy, thoracic region: Secondary | ICD-10-CM | POA: Diagnosis not present

## 2019-12-10 ENCOUNTER — Encounter: Payer: Self-pay | Admitting: Ophthalmology

## 2019-12-10 ENCOUNTER — Telehealth: Payer: Self-pay

## 2019-12-10 ENCOUNTER — Other Ambulatory Visit: Payer: Self-pay

## 2019-12-10 DIAGNOSIS — G4700 Insomnia, unspecified: Secondary | ICD-10-CM | POA: Diagnosis not present

## 2019-12-10 DIAGNOSIS — M4804 Spinal stenosis, thoracic region: Secondary | ICD-10-CM | POA: Diagnosis not present

## 2019-12-10 DIAGNOSIS — M4646 Discitis, unspecified, lumbar region: Secondary | ICD-10-CM | POA: Diagnosis not present

## 2019-12-10 DIAGNOSIS — I081 Rheumatic disorders of both mitral and tricuspid valves: Secondary | ICD-10-CM | POA: Diagnosis not present

## 2019-12-10 DIAGNOSIS — G834 Cauda equina syndrome: Secondary | ICD-10-CM | POA: Diagnosis not present

## 2019-12-10 DIAGNOSIS — M47816 Spondylosis without myelopathy or radiculopathy, lumbar region: Secondary | ICD-10-CM | POA: Diagnosis not present

## 2019-12-10 DIAGNOSIS — L89312 Pressure ulcer of right buttock, stage 2: Secondary | ICD-10-CM | POA: Diagnosis not present

## 2019-12-10 DIAGNOSIS — N319 Neuromuscular dysfunction of bladder, unspecified: Secondary | ICD-10-CM | POA: Diagnosis not present

## 2019-12-10 DIAGNOSIS — L89894 Pressure ulcer of other site, stage 4: Secondary | ICD-10-CM | POA: Diagnosis not present

## 2019-12-10 DIAGNOSIS — M40204 Unspecified kyphosis, thoracic region: Secondary | ICD-10-CM | POA: Diagnosis not present

## 2019-12-10 DIAGNOSIS — M4316 Spondylolisthesis, lumbar region: Secondary | ICD-10-CM | POA: Diagnosis not present

## 2019-12-10 DIAGNOSIS — M5144 Schmorl's nodes, thoracic region: Secondary | ICD-10-CM | POA: Diagnosis not present

## 2019-12-10 DIAGNOSIS — M4604 Spinal enthesopathy, thoracic region: Secondary | ICD-10-CM | POA: Diagnosis not present

## 2019-12-10 DIAGNOSIS — D72829 Elevated white blood cell count, unspecified: Secondary | ICD-10-CM | POA: Diagnosis not present

## 2019-12-10 DIAGNOSIS — M48061 Spinal stenosis, lumbar region without neurogenic claudication: Secondary | ICD-10-CM | POA: Diagnosis not present

## 2019-12-10 DIAGNOSIS — J9811 Atelectasis: Secondary | ICD-10-CM | POA: Diagnosis not present

## 2019-12-10 NOTE — Telephone Encounter (Signed)
Diane nurse with [facility] left v/m requesting verbal orders for recertification for 60 days for nursing for wound care until wound healed. Pt presently going to wound center.Please advise.

## 2019-12-10 NOTE — Telephone Encounter (Signed)
Diane advised. 

## 2019-12-10 NOTE — Telephone Encounter (Signed)
[place] for continued [facility]

## 2019-12-11 DIAGNOSIS — L89304 Pressure ulcer of unspecified buttock, stage 4: Secondary | ICD-10-CM | POA: Diagnosis not present

## 2019-12-12 ENCOUNTER — Other Ambulatory Visit: Payer: Self-pay

## 2019-12-12 ENCOUNTER — Encounter: Payer: BC Managed Care – PPO | Admitting: Internal Medicine

## 2019-12-12 DIAGNOSIS — Z96642 Presence of left artificial hip joint: Secondary | ICD-10-CM | POA: Diagnosis not present

## 2019-12-12 DIAGNOSIS — K6812 Psoas muscle abscess: Secondary | ICD-10-CM | POA: Diagnosis not present

## 2019-12-12 DIAGNOSIS — T8131XA Disruption of external operation (surgical) wound, not elsewhere classified, initial encounter: Secondary | ICD-10-CM | POA: Diagnosis not present

## 2019-12-12 DIAGNOSIS — T8131XD Disruption of external operation (surgical) wound, not elsewhere classified, subsequent encounter: Secondary | ICD-10-CM | POA: Diagnosis not present

## 2019-12-12 DIAGNOSIS — L0231 Cutaneous abscess of buttock: Secondary | ICD-10-CM | POA: Diagnosis not present

## 2019-12-12 DIAGNOSIS — L89154 Pressure ulcer of sacral region, stage 4: Secondary | ICD-10-CM | POA: Diagnosis not present

## 2019-12-12 DIAGNOSIS — M353 Polymyalgia rheumatica: Secondary | ICD-10-CM | POA: Diagnosis not present

## 2019-12-13 ENCOUNTER — Telehealth: Payer: Self-pay | Admitting: Family Medicine

## 2019-12-13 ENCOUNTER — Other Ambulatory Visit
Admission: RE | Admit: 2019-12-13 | Discharge: 2019-12-13 | Disposition: A | Discharge status: Home / Self Care | Payer: BC Managed Care – PPO | Source: Ambulatory Visit | Attending: Ophthalmology | Admitting: Ophthalmology

## 2019-12-13 DIAGNOSIS — Z20822 Contact with and (suspected) exposure to covid-19: Secondary | ICD-10-CM | POA: Diagnosis not present

## 2019-12-13 DIAGNOSIS — Z01812 Encounter for preprocedural laboratory examination: Secondary | ICD-10-CM | POA: Diagnosis not present

## 2019-12-13 NOTE — Progress Notes (Signed)
[name] ([id number]) Visit Report for 12/12/2019 HPI Details Patient Name: [name], [name] Date of Service: 12/12/2019 11:00 AM Medical Record Number: [id number] Patient Account Number: [id number] Date of Birth/Sex: 05/26/1966 (54 y.o. M) Treating RN: [name] Primary Care Provider: [name] Other Clinician: Referring Provider: [name] Treating Provider/Extender: [name] in Treatment: 14 History of Present Illness HPI Description: ADMISSION 09/05/2019 This is a 54 year old man who has a complicated history which started with acute low back pain sometime in September. Saw his primary doctor had a CT scan of the abdomen and pelvis that was normal. By late December he was discovered to have a lumbar epidural abscess with cauda equina syndrome. He was admitted to hospital and had a decompressive laminectomy at L3/L4/L5 blood cultures at the time showed MSSA he received IV nafcillin. He went to rehab from 1/5 through 1/15 there he was noted to have an "pressure injury in the skin" although I did not see much more about this in the discharge instructions. Unfortunately this wound became infected after he was discharged home. He required readmission from 1/25 through 2/2 with sepsis, discovered to have a right psoas abscess as well as an infected decubitus ulcer. He had a bedside debridement by general surgery on 1/25 interventional radiology drain the right psoas abscess. General surgery recommended 3 times daily wet- to-dry dressings and an air mattress. He has since been discharged home. He is mobile with a walker. They are using 3 times daily wet-to-dry dressings. According to his family he is eating well. He has a protein supplement but he he is not taking it. He is currently on ampicillin sulbactam as recommended by [name] this is due to be finished currently on 2/22 although there seem to be some suggestion it might go longer. They say he had blood work 2  days ago which I will review. The last lab work I saw was a sedimentation rate of 81 and a C-reactive protein of 212 on 1/25. I did not see any imaging studies of the underlying bone although I will need to review the CT scans that he has had Past medical history, polymyalgia rheumatica, left total hip replacement in 2018 for avascular necrosis, cellulitis of the scrotum in July 2020, history of alcohol abuse but that is not currently problematic 09/12/2019; patient readmitted to the clinic last week. I put him on a wet-to-dry dressing with silver sorb gel to the larger wound area on his coccyx and proximal right buttocks. These are actually connected. In general the wound surfaces look quite good although they are deep and there is a large amount of undermining. There is no exposed bone. Last lab work on 2/8 that was ordered by [name] showed a CRP of 68.5 and a sedimentation rate of greater than 130. The CRP was elevated versus 18.3 on 1/10 although it is difficult to interpret these necessarily in somebody with polymyalgia rheumatica. His white count was 15.7 hemoglobin 10.7 The patient is offloading this is much as he can says that he is eating and drinking well which seems verified by his family member. 2/24; patient with a large wound on the lower sacrum and proximal right buttocks. There is a bridge of overlying skin but the wounds are connected and there is wide undermining. We are using wet-to-dry dressings. I have reviewed [name] notes of 09/17/2019 he is remove the PICC line. He had a prolonged course of IV antibiotics for the MSSA bacteremia, lumbar infection,  psoas abscess and a necrotic sacral wound. Interestingly I noticed the same history of PMR in [name] that [name] comments on. I went over the case again with the patient and his daughter he is totally unfamiliar with anything to do with polymyalgia rheumatica or its obvious symptoms that I also described. I  told him that this is not a diagnosis that most doctors would make and put in an E HR without documentation but he is just not familiar with it and neither his his daughter. For now we are using wet-to-dry dressings. I think it is time to try a wound VAC now that we are certain about underlying infection issues 3/3; he arrives today with his wound actually looks somewhat better. Two small areas with a bridge of skin actually have contracted I believe there is less depth. There is still extensive undermining. He got his VAC machine yesterday but they have still not put it on. 3/10; the orifice of the actual wound is contracting faster than the undermining dimensions of the wound. This is probably soon going to make this difficult to continue with the [facility] 10/15/2019 upon evaluation today patient appears to be doing well in general with regard to his wound. Apparently this looks good although I have not really seen him he has been seen by [name] up to this point. Nonetheless he is currently on a wound VAC and apparently has been having some odor over the past several days which is why comes in for evaluation earlier today. I am going obtain a wound culture and then subsequently we will likely place the patient on an antibiotic at this point to try to help out with the odor which I presume is coming from a bacterial infection. The patient has been off of all antibiotics for the past month having ended on 22 February. With that being said he is not having any increased pain or anything else going on at this point which is good news. He is not allergic to any antibiotics that he is aware of. 4/7; it has been almost a month since I saw this patient although he was here 2 weeks ago. He had a wound VAC on this wound. I note that he was treated for infection with 2 weeks of Levaquin. We had a report recently from home health that there was an extensive rash apparently in the periwound and they took the [facility]  off for a few days however the rash appears to have cleaned up and the wound VAC was back on on his presentation today. Since I saw this last the year is been considerable reduction in overall wound area 4/21; 2-week follow-up. Still using the wound VAC although I am not completely certain that [place] home health is putting this on continuously per discussion with the patient's family. He still has the 2 connecting wounds. The larger area is more midline the smaller area to the right. There is no undermining beyond the smaller wound that I was able to determine. However the larger wound slopes towards that area. We have some improvement in the overall wound measurements still using the wound VAC. 5/5; 2-week follow-up. We held the wound VAC last week largely as the wound and closed down. There is still 2 open areas the more midline area and the area towards the right separated by normal skin. We are using silver alginate 5/19; 2-week follow-up. We discontinued the wound VAC the area has closed down tremendously he now has  2 small open areas separated by normal skin. Not much change using silver alginate from last time. The 2 areas connect but there is not a lot of additional undermining here. No evidence of surrounding infection. The patient has been compliant with offloading Morell, AAKASH KOSIOR ([id number]) Electronic Signature(s) Signed: 12/12/2019 5:06:12 PM By: Linton Ham MD Entered By: Linton Ham on 12/12/2019 12:05:21 [name], [name] ([id number]) -------------------------------------------------------------------------------- Physical Exam Details Patient Name: Fiallo, [name] Date of Service: 12/12/2019 11:00 AM Medical Record Number: [id number] Patient Account Number: [id number] Date of Birth/Sex: Apr 03, 1966 (54 y.o. M) Treating RN: [name] Primary Care Provider: [name] Other Clinician: Referring Provider: [name] Treating Provider/Extender: [name]  in Treatment: 14 Constitutional Sitting or standing Blood Pressure is within target range for patient.. Pulse regular and within target range for patient.Marland Kitchen Respirations regular, non- labored and within target range.. Temperature is normal and within the target range for the patient.Marland Kitchen appears in no distress. Notes Wound exam; 2 open areas that are connected with a rim of skin in between. This is characterized this wound even when it was much larger. There does not appear to be any undermining. There is no palpable bone. No evidence of infection around the wound no drainage Electronic Signature(s) Signed: 12/12/2019 5:06:12 PM By: Linton Ham MD Entered By: Linton Ham on 12/12/2019 12:06:07 [name], [name] ([id number]) -------------------------------------------------------------------------------- Physician Orders Details Patient Name: [name], [name] Date of Service: 12/12/2019 11:00 AM Medical Record Number: [id number] Patient Account Number: [id number] Date of Birth/Sex: 11-10-1965 (54 y.o. M) Treating RN: [name] Primary Care Provider: [name] Other Clinician: Referring Provider: [name] Treating Provider/Extender: [name] in Treatment: 14 Verbal / Phone Orders: No Diagnosis Coding Wound Cleansing Wound #1 Midline Sacrum o Clean wound with Normal Saline. Anesthetic (add to Medication List) Wound #1 Midline Sacrum o Topical Lidocaine 4% cream applied to wound bed prior to debridement (In Clinic Only). Skin Barriers/Peri-Wound Care Wound #1 Midline Sacrum o Skin Prep Primary Wound Dressing Wound #1 Midline Sacrum o Silver Collagen - moisten with saline Secondary Dressing Wound #1 Midline Sacrum o Boardered Foam Dressing Dressing Change Frequency Wound #1 Midline Sacrum o Change Dressing Monday, Wednesday, Friday - Homeheath Follow-up Appointments Wound #1 Midline Sacrum o Return Appointment in 2 weeks. Off-Loading Wound  #1 Midline Sacrum o Other: - NO Pressure on the wounded areas. Additional Orders / Instructions Wound #1 Midline Sacrum o Increase protein intake. - Powder protein supplement, multivitamin o Activity as tolerated Home Health Wound #1 Midline [place] Visits - [place] Nurse may visit PRN to address patientos wound care needs. o FACE TO FACE ENCOUNTER: MEDICARE and MEDICAID PATIENTS: I certify that this patient is under my care and that I had a face-to-face encounter that meets the physician face-to-face encounter requirements with this patient on this date. The encounter with the patient was in whole or in part for the following MEDICAL CONDITION: (primary reason for [place]) MEDICAL NECESSITY: I certify, that based on my findings, NURSING services are a medically necessary home health service. HOME BOUND STATUS: I certify that my clinical findings support that this patient is homebound (i.e., Due to illness or injury, pt requires aid of supportive devices such as crutches, cane, wheelchairs, walkers, the use of special transportation or the assistance of another person to leave their place of residence. There is a normal inability to leave the home and doing so requires considerable and taxing effort.  Other absences are for medical reasons / religious services and are infrequent or of short duration when for other reasons). [place], JEANETTE DIFRANCO ([id number]) o If current dressing causes regression in wound condition, may D/C ordered dressing product/s and apply Normal Saline Moist Dressing daily until next [place] / Other MD appointment. [place] of regression in wound condition at [phone]. o Please direct any NON-WOUND related issues/requests for orders to patient's Primary Care Physician Electronic Signature(s) Signed: 12/12/2019 5:06:12 PM By: Linton Ham MD Signed: 12/13/2019 9:48:50 AM By:  Gretta Cool, BSN, RN, CWS, Kim RN, BSN Entered By: Gretta Cool, BSN, RN, CWS, Kim on 12/12/2019 11:21:31 [name], [name] ([id number]) -------------------------------------------------------------------------------- Problem List Details Patient Name: [name], [name] Date of Service: 12/12/2019 11:00 AM Medical Record Number: [id number] Patient Account Number: [id number] Date of Birth/Sex: 08/29/1965 (54 y.o. M) Treating RN: [name] Primary Care Provider: [name] Other Clinician: Referring Provider: [name] Treating Provider/Extender: [name] in Treatment: 14 Active Problems ICD-10 Encounter Code Description Active Date MDM Diagnosis L89.154 Pressure ulcer of sacral region, stage 4 09/05/2019 No Yes T81.31XD Disruption of external operation (surgical) wound, not elsewhere 09/05/2019 No Yes classified, subsequent encounter L02.31 Cutaneous abscess of buttock 09/05/2019 No Yes Inactive Problems Resolved Problems Electronic Signature(s) Signed: 12/12/2019 5:06:12 PM By: Linton Ham MD Entered By: Linton Ham on 12/12/2019 11:58:24 [name], [name] ([id number]) -------------------------------------------------------------------------------- Progress Note Details Patient Name: Romito, [name] Date of Service: 12/12/2019 11:00 AM Medical Record Number: [id number] Patient Account Number: [id number] Date of Birth/Sex: 06/12/1966 (54 y.o. M) Treating RN: [name] Primary Care Provider: [name] Other Clinician: Referring Provider: [name] Treating Provider/Extender: [name] in Treatment: 14 Subjective History of Present Illness (HPI) ADMISSION 09/05/2019 This is a 54 year old man who has a complicated history which started with acute low back pain sometime in September. Saw his primary doctor had a CT scan of the abdomen and pelvis that was normal. By late December he was discovered to have a lumbar epidural abscess with cauda equina  syndrome. He was admitted to hospital and had a decompressive laminectomy at L3/L4/L5 blood cultures at the time showed MSSA he received IV nafcillin. He went to rehab from 1/5 through 1/15 there he was noted to have an "pressure injury in the skin" although I did not see much more about this in the discharge instructions. Unfortunately this wound became infected after he was discharged home. He required readmission from 1/25 through 2/2 with sepsis, discovered to have a right psoas abscess as well as an infected decubitus ulcer. He had a bedside debridement by general surgery on 1/25 interventional radiology drain the right psoas abscess. General surgery recommended 3 times daily wet- to-dry dressings and an air mattress. He has since been discharged home. He is mobile with a walker. They are using 3 times daily wet-to-dry dressings. According to his family he is eating well. He has a protein supplement but he he is not taking it. He is currently on ampicillin sulbactam as recommended by [name] this is due to be finished currently on 2/22 although there seem to be some suggestion it might go longer. They say he had blood work 2 days ago which I will review. The last lab work I saw was a sedimentation rate of 81 and a C-reactive protein of 212 on 1/25. I did not see any imaging studies of the underlying bone although I will need to review the CT scans that he has  had Past medical history, polymyalgia rheumatica, left total hip replacement in 2018 for avascular necrosis, cellulitis of the scrotum in July 2020, history of alcohol abuse but that is not currently problematic 09/12/2019; patient readmitted to the clinic last week. I put him on a wet-to-dry dressing with silver sorb gel to the larger wound area on his coccyx and proximal right buttocks. These are actually connected. In general the wound surfaces look quite good although they are deep and there is a large amount of undermining. There is  no exposed bone. Last lab work on 2/8 that was ordered by [name] showed a CRP of 68.5 and a sedimentation rate of greater than 130. The CRP was elevated versus 18.3 on 1/10 although it is difficult to interpret these necessarily in somebody with polymyalgia rheumatica. His white count was 15.7 hemoglobin 10.7 The patient is offloading this is much as he can says that he is eating and drinking well which seems verified by his family member. 2/24; patient with a large wound on the lower sacrum and proximal right buttocks. There is a bridge of overlying skin but the wounds are connected and there is wide undermining. We are using wet-to-dry dressings. I have reviewed [name] notes of 09/17/2019 he is remove the PICC line. He had a prolonged course of IV antibiotics for the MSSA bacteremia, lumbar infection, psoas abscess and a necrotic sacral wound. Interestingly I noticed the same history of PMR in [place] link that [name] comments on. I went over the case again with the patient and his daughter he is totally unfamiliar with anything to do with polymyalgia rheumatica or its obvious symptoms that I also described. I told him that this is not a diagnosis that most doctors would make and put in an E HR without documentation but he is just not familiar with it and neither his his daughter. For now we are using wet-to-dry dressings. I think it is time to try a wound VAC now that we are certain about underlying infection issues 3/3; he arrives today with his wound actually looks somewhat better. Two small areas with a bridge of skin actually have contracted I believe there is less depth. There is still extensive undermining. He got his VAC machine yesterday but they have still not put it on. 3/10; the orifice of the actual wound is contracting faster than the undermining dimensions of the wound. This is probably soon going to make this difficult to continue with the [facility] 10/15/2019 upon  evaluation today patient appears to be doing well in general with regard to his wound. Apparently this looks good although I have not really seen him he has been seen by [name] up to this point. Nonetheless he is currently on a wound VAC and apparently has been having some odor over the past several days which is why comes in for evaluation earlier today. I am going obtain a wound culture and then subsequently we will likely place the patient on an antibiotic at this point to try to help out with the odor which I presume is coming from a bacterial infection. The patient has been off of all antibiotics for the past month having ended on 22 February. With that being said he is not having any increased pain or anything else going on at this point which is good news. He is not allergic to any antibiotics that he is aware of. 4/7; it has been almost a month since I saw this patient  although he was here 2 weeks ago. He had a wound VAC on this wound. I note that he was treated for infection with 2 weeks of Levaquin. We had a report recently from home health that there was an extensive rash apparently in the periwound and they took the [facility] off for a few days however the rash appears to have cleaned up and the wound VAC was back on on his presentation today. Since I saw this last the year is been considerable reduction in overall wound area 4/21; 2-week follow-up. Still using the wound VAC although I am not completely certain that [place] home health is putting this on continuously per discussion with the patient's family. He still has the 2 connecting wounds. The larger area is more midline the smaller area to the right. There is no undermining beyond the smaller wound that I was able to determine. However the larger wound slopes towards that area. We have some improvement in the overall wound measurements still using the wound VAC. 5/5; 2-week follow-up. We held the wound VAC last week largely as the  wound and closed down. There is still 2 open areas the more midline area and the area towards the right separated by normal skin. We are using silver alginate 5/19; 2-week follow-up. We discontinued the wound VAC the area has closed down tremendously he now has 2 small open areas separated by normal skin. Not much change using silver alginate from last time. The 2 areas connect but there is not a lot of additional undermining here. No evidence of surrounding infection. The patient has been compliant with offloading [place], [place] R. ([id number]) Objective Constitutional Sitting or standing Blood Pressure is within target range for patient.. Pulse regular and within target range for patient.Marland Kitchen Respirations regular, non- labored and within target range.. Temperature is normal and within the target range for the patient.Marland Kitchen appears in no distress. Vitals Time Taken: 11:02 AM, Height: 72 in, Weight: 230 lbs, BMI: 31.2, Temperature: 98.6 F, Pulse: 71 bpm, Respiratory Rate: 16 breaths/min, Blood Pressure: 121/72 mmHg. General Notes: Wound exam; 2 open areas that are connected with a rim of skin in between. This is characterized this wound even when it was much larger. There does not appear to be any undermining. There is no palpable bone. No evidence of infection around the wound no drainage Integumentary (Hair, Skin) Wound #1 status is Open. Original cause of wound was Gradually Appeared. The wound is located on the Midline Sacrum. The wound measures 1cm length x 1cm width x 2cm depth; 0.785cm^2 area and 1.571cm^3 volume. There is Fat Layer (Subcutaneous Tissue) Exposed exposed. There is no undermining noted, however, there is tunneling at 3:00 with a maximum distance of 2.2cm. There is a medium amount of serosanguineous drainage noted. Foul odor after cleansing was noted. The wound margin is thickened. There is large (67-100%) red, hyper - granulation within the wound bed. There is a small (1-33%) amount  of necrotic tissue within the wound bed including Adherent Slough. Assessment Active Problems ICD-10 Pressure ulcer of sacral region, stage 4 Disruption of external operation (surgical) wound, not elsewhere classified, subsequent encounter Cutaneous abscess of buttock Plan Wound Cleansing: Wound #1 Midline Sacrum: Clean wound with Normal Saline. Anesthetic (add to Medication List): Wound #1 Midline Sacrum: Topical Lidocaine 4% cream applied to wound bed prior to debridement (In Clinic Only). Skin Barriers/Peri-Wound Care: Wound #1 Midline Sacrum: Skin Prep Primary Wound Dressing: Wound #1 Midline Sacrum: Silver Collagen - moisten with saline Secondary Dressing:  Wound #1 Midline Sacrum: Boardered Foam Dressing Dressing Change Frequency: Wound #1 Midline Sacrum: Change Dressing Monday, Wednesday, Friday - Homeheath Follow-up Appointments: Wound #1 Midline Sacrum: Return Appointment in 2 weeks. Off-Loading: Wound #1 Midline Sacrum: Other: - NO Pressure on the wounded areas. Additional Orders / Instructions: Wound #1 Midline Sacrum: Increase protein intake. - Powder protein supplement, multivitamin Activity as tolerated Home Health: Wound #1 Midline Sacrum: Continue Home Health Visits - [place] Nurse may visit PRN to address patient s wound care needs. [name], [name] ([id number]) FACE TO FACE ENCOUNTER: MEDICARE and MEDICAID PATIENTS: I certify that this patient is under my care and that I had a face-to-face encounter that meets the physician face-to-face encounter requirements with this patient on this date. The encounter with the patient was in whole or in part for the following MEDICAL CONDITION: (primary reason for [place]) MEDICAL NECESSITY: I certify, that based on my findings, NURSING services are a medically necessary home health service. HOME BOUND STATUS: I certify that my clinical findings support that this patient is homebound (i.e., Due to  illness or injury, pt requires aid of supportive devices such as crutches, cane, wheelchairs, walkers, the use of special transportation or the assistance of another person to leave their place of residence. There is a normal inability to leave the home and doing so requires considerable and taxing effort. Other absences are for medical reasons / religious services and are infrequent or of short duration when for other reasons). If current dressing causes regression in wound condition, may D/C ordered dressing product/s and apply Normal Saline Moist Dressing daily until next [place] / Other MD appointment. [place] of regression in wound condition at [phone]. Please direct any NON-WOUND related issues/requests for orders to patient's Primary Care Physician 1. We change the primary dressing to moistened silver collagen and see if we can get this granulation to fill in the depth of both of these areas. 2. The smaller area towards the right I did not think had any depth last time it certainly opened up this time 3. As usual in this wound I am faced with the prospect of removing the skin in between although I do not relish this I hope this will fill in Electronic Signature(s) Signed: 12/12/2019 5:06:12 PM By: Linton Ham MD Entered By: Linton Ham on 12/12/2019 12:08:13 [name], [name] ([id number]) -------------------------------------------------------------------------------- [place] Details Patient Name: Greaser, [name] Date of Service: 12/12/2019 Medical Record Number: [id number] Patient Account Number: [id number] Date of Birth/Sex: Dec 03, 1965 (54 y.o. M) Treating RN: [name] Primary Care Provider: [name] Other Clinician: Referring Provider: [name] Treating Provider/Extender: [name] in Treatment: 14 Diagnosis Coding ICD-10 Codes Code Description L89.154 Pressure ulcer of sacral region, stage 4 T81.31XD Disruption  of external operation (surgical) wound, not elsewhere classified, subsequent encounter L02.31 Cutaneous abscess of buttock Facility Procedures CPT4 Code: [id number] Description: 99213 - WOUND CARE VISIT-LEV 3 EST PT Modifier: Quantity: 1 Physician Procedures CPT4 Code Description: [id number] 99213 - WC PHYS LEVEL 3 - EST PT Modifier: Quantity: 1 CPT4 Code Description: ICD-10 Diagnosis Description L89.154 Pressure ulcer of sacral region, stage 4 T81.31XD Disruption of external operation (surgical) wound, not elsewhere classi L02.31 Cutaneous abscess of buttock Modifier: fied, subsequent enco Quantity: Personal assistant) Signed: 12/12/2019 5:06:12 PM By: Linton Ham MD Entered By: Linton Ham on 12/12/2019 12:08:36

## 2019-12-13 NOTE — Telephone Encounter (Signed)
Patient daughter [name] called and left a voicemail regarding a call back - states she has questions .   ATC number left on vmail and there is not a voicemail set up.   Will have to call back

## 2019-12-13 NOTE — Discharge Instructions (Signed)

## 2019-12-13 NOTE — Telephone Encounter (Signed)
[name] returned our call Patient has jury duty next month 01/03/20 - needs dismissal letter.  Pt is not able to drive himself - daughter does most transporting d/t his vision.  Pt is scheduled for cataract surgery on Monday 12/17/19 She does not feel that he will be able to get himself there or ambulate without assistance.   Please advise, thanks.

## 2019-12-13 NOTE — Telephone Encounter (Signed)
Letter completed and sent via MyChart

## 2019-12-13 NOTE — Telephone Encounter (Signed)
[name] advised. She will look into getting this from mychart. [name] will let us know if we need to print this out or if there are any other issues.

## 2019-12-13 NOTE — Progress Notes (Signed)
[name] ([id number]) Visit Report for 12/12/2019 Arrival Information Details Patient Name: [name], [name] Date of Service: 12/12/2019 11:00 AM Medical Record Number: [id number] Patient Account Number: [id number] Date of Birth/Sex: 03/10/1966 (54 y.o. M) Treating RN: Army Melia Primary Care Yazmeen Woolf: [name] Other Clinician: Referring Hale Chalfin: [name] Treating Adan Baehr/Extender: [name] in Treatment: 14 Visit Information History Since Last Visit Added or deleted any medications: No Patient Arrived: Ambulatory Any new allergies or adverse reactions: No Arrival Time: 11:02 Had a fall or experienced change in No Accompanied By: daughter activities of daily living that may affect Transfer Assistance: None risk of falls: Patient Identification Verified: Yes Signs or symptoms of abuse/neglect since last visito No Hospitalized since last visit: No Has Dressing in Place as Prescribed: Yes Pain Present Now: Yes Electronic Signature(s) Signed: 12/12/2019 11:41:19 AM By: Army Melia Entered By: Army Melia on 12/12/2019 11:02:21 [name] ([id number]) -------------------------------------------------------------------------------- Clinic Level of Care Assessment Details Patient Name: [name] Date of Service: 12/12/2019 11:00 AM Medical Record Number: [id number] Patient Account Number: [id number] Date of Birth/Sex: 09/08/1965 (54 y.o. M) Treating RN: [name] Primary Care Lainy Wrobleski: [name] Other Clinician: Referring Milica Gully: [name] Treating Coran Dipaola/Extender: [name] in Treatment: 14 Clinic Level of Care Assessment Items TOOL 4 Quantity Score []  - Use when only an EandM is performed on FOLLOW-UP visit 0 ASSESSMENTS - Nursing Assessment / Reassessment X - Reassessment of Co-morbidities (includes updates in patient status) 1 10 X- 1 5 Reassessment of Adherence to Treatment Plan ASSESSMENTS - Wound and Skin  Assessment / Reassessment X - Simple Wound Assessment / Reassessment - one wound 1 5 []  - 0 Complex Wound Assessment / Reassessment - multiple wounds []  - 0 Dermatologic / Skin Assessment (not related to wound area) ASSESSMENTS - Focused Assessment []  - Circumferential Edema Measurements - multi extremities 0 []  - 0 Nutritional Assessment / Counseling / Intervention []  - 0 Lower Extremity Assessment (monofilament, tuning fork, pulses) []  - 0 Peripheral Arterial Disease Assessment (using hand held doppler) ASSESSMENTS - Ostomy and/or Continence Assessment and Care []  - Incontinence Assessment and Management 0 []  - 0 Ostomy Care Assessment and Management (repouching, etc.) PROCESS - Coordination of Care X - Simple Patient / Family Education for ongoing care 1 15 []  - 0 Complex (extensive) Patient / Family Education for ongoing care X- 1 10 Staff obtains Programmer, systems, Records, Test Results / Process Orders []  - 0 Staff telephones HHA, Nursing Homes / Clarify orders / etc []  - 0 Routine Transfer to another Facility (non-emergent condition) []  - 0 Routine Hospital Admission (non-emergent condition) []  - 0 New Admissions / Biomedical engineer / Ordering NPWT, Apligraf, etc. []  - 0 Emergency Hospital Admission (emergent condition) X- 1 10 Simple Discharge Coordination []  - 0 Complex (extensive) Discharge Coordination PROCESS - Special Needs []  - Pediatric / Minor Patient Management 0 []  - 0 Isolation Patient Management []  - 0 Hearing / Language / Visual special needs []  - 0 Assessment of Community assistance (transportation, D/C planning, etc.) []  - 0 Additional assistance / Altered mentation []  - 0 Support Surface(s) Assessment (bed, cushion, seat, etc.) INTERVENTIONS - Wound Cleansing / Measurement [name], [name] ([id number]) X- 1 5 Simple Wound Cleansing - one wound []  - 0 Complex Wound Cleansing - multiple wounds X- 1 5 Wound Imaging (photographs - any number of  wounds) []  - 0 Wound Tracing (instead of photographs) X- 1 5 Simple Wound Measurement - one wound []  -  0 Complex Wound Measurement - multiple wounds INTERVENTIONS - Wound Dressings []  - Small Wound Dressing one or multiple wounds 0 X- 1 15 Medium Wound Dressing one or multiple wounds []  - 0 Large Wound Dressing one or multiple wounds []  - 0 Application of Medications - topical []  - 0 Application of Medications - injection INTERVENTIONS - Miscellaneous []  - External ear exam 0 []  - 0 Specimen Collection (cultures, biopsies, blood, body fluids, etc.) []  - 0 Specimen(s) / Culture(s) sent or taken to Lab for analysis []  - 0 Patient Transfer (multiple staff / Civil Service fast streamer / Similar devices) []  - 0 Simple Staple / Suture removal (25 or less) []  - 0 Complex Staple / Suture removal (26 or more) []  - 0 Hypo / Hyperglycemic Management (close monitor of Blood Glucose) []  - 0 Ankle / Brachial Index (ABI) - do not check if billed separately X- 1 5 Vital Signs Has the patient been seen at the hospital within the last three years: Yes Total Score: 90 Level Of Care: New/Established - Level 3 Electronic Signature(s) Signed: 12/13/2019 9:48:50 AM By: Gretta Cool, BSN, RN, CWS, Kim RN, BSN Entered By: Gretta Cool, BSN, RN, CWS, Kim on 12/12/2019 11:23:14 [name] ([id number]) -------------------------------------------------------------------------------- Encounter Discharge Information Details Patient Name: [name], [name] Date of Service: 12/12/2019 11:00 AM Medical Record Number: [id number] Patient Account Number: [id number] Date of Birth/Sex: 12-10-1965 (54 y.o. M) Treating RN: [name] Primary Care Danaysia Rader: [name] Other Clinician: Referring Lashon Hillier: [name] Treating Jag Lenz/Extender: [name] in Treatment: 14 Encounter Discharge Information Items Discharge Condition: Stable Ambulatory Status: Ambulatory Discharge Destination: Home Transportation:  Private Auto Accompanied By: daughter Schedule Follow-up Appointment: Yes Clinical Summary of Care: Electronic Signature(s) Signed: 12/13/2019 9:48:50 AM By: Gretta Cool, BSN, RN, CWS, Kim RN, BSN Entered By: Gretta Cool, BSN, RN, CWS, Kim on 12/12/2019 11:22:25 Raffo, Candelaria [name] ([id number]) -------------------------------------------------------------------------------- Lower Extremity Assessment Details Patient Name: Camplin, [name] Date of Service: 12/12/2019 11:00 AM Medical Record Number: [id number] Patient Account Number: [id number] Date of Birth/Sex: 1966-04-06 (54 y.o. M) Treating RN: Army Melia Primary Care Issabela Lesko: [name] Other Clinician: Referring Larina Lieurance: [name] Treating Velma Agnes/Extender: Ricard Dillon Weeks in Treatment: 14 Electronic Signature(s) Signed: 12/12/2019 11:41:19 AM By: Army Melia Entered By: Army Melia on 12/12/2019 11:07:09 Hershman, Candelaria [name] ([id number]) -------------------------------------------------------------------------------- Multi Wound Chart Details Patient Name: Patel, Candelaria [name]. Date of Service: 12/12/2019 11:00 AM Medical Record Number: [id number] Patient Account Number: [id number] Date of Birth/Sex: 06/06/1966 (54 y.o. M) Treating RN: [name] Primary Care Marlaya Turck: [name] Other Clinician: Referring Latravia Southgate: [name] Treating Kionna Brier/Extender: [name] in Treatment: 14 Vital Signs Height(in): 72 Pulse(bpm): 71 Weight(lbs): 230 Blood Pressure(mmHg): 121/72 Body Mass Index(BMI): 31 Temperature(F): 98.6 Respiratory Rate(breaths/min): 16 Photos: [N/A:N/A] Wound Location: Midline Sacrum N/A N/A Wounding Event: Gradually Appeared N/A N/A Primary Etiology: Abscess N/A N/A Date Acquired: 07/24/2019 N/A N/A Weeks of Treatment: 14 N/A N/A Wound Status: Open N/A N/A Measurements L x W x D (cm) 1x1x2 N/A N/A Area (cm) : 0.785 N/A N/A Volume (cm) : 1.571 N/A N/A % Reduction in Area: 90.50% N/A N/A %  Reduction in Volume: 95.80% N/A N/A Position 1 (o'clock): 3 Maximum Distance 1 (cm): 2.2 Tunneling: Yes N/A N/A Classification: Full Thickness With Exposed N/A N/A Support Structures Exudate Amount: Medium N/A N/A Exudate Type: Serosanguineous N/A N/A Exudate Color: red, brown N/A N/A Foul Odor After Cleansing: Yes N/A N/A Odor Anticipated Due to Product No N/A N/A Use: Wound Margin: Thickened N/A  N/A Granulation Amount: Large (67-100%) N/A N/A Granulation Quality: Red, Hyper-granulation N/A N/A Necrotic Amount: Small (1-33%) N/A N/A Exposed Structures: Fat Layer (Subcutaneous Tissue) N/A N/A Exposed: Yes Fascia: No Tendon: No Muscle: No Joint: No Bone: No Epithelialization: None N/A N/A Treatment Notes Wound #1 (Midline Sacrum) Notes Disbrow, Treshaun R. ([id number]) Silver collagen packed lightly into entire open space, secured with bordered foam dressing. Electronic Signature(s) Signed: 12/12/2019 5:06:12 PM By: Linton Ham MD Entered By: Linton Ham on 12/12/2019 11:58:36 Leisey, Candelaria [name] ([id number]) -------------------------------------------------------------------------------- [place] Details Patient Name: Armas, Candelaria [name]. Date of Service: 12/12/2019 11:00 AM Medical Record Number: [id number] Patient Account Number: [id number] Date of Birth/Sex: 09/27/1965 (54 y.o. M) Treating RN: [name] Primary Care Marquail Bradwell: [name] Other Clinician: Referring Haili Donofrio: [name] Treating Mikki Ziff/Extender: [name] in Treatment: 14 Active Inactive Necrotic Tissue Nursing Diagnoses: Impaired tissue integrity related to necrotic/devitalized tissue Goals: Necrotic/devitalized tissue will be minimized in the wound bed Date Initiated: 09/05/2019 Target Resolution Date: 09/26/2019 Goal Status: Active Interventions: Assess patient pain level pre-, during and post procedure and prior to discharge Treatment Activities: Apply topical  anesthetic as ordered : 09/05/2019 Notes: Orientation to the Wound Care Program Nursing Diagnoses: Knowledge deficit related to the wound healing center program Goals: Patient/caregiver will verbalize understanding of the [place] Date Initiated: 09/05/2019 Target Resolution Date: 09/26/2019 Goal Status: Active Interventions: Provide education on orientation to the wound center Notes: Pressure Nursing Diagnoses: Knowledge deficit related to management of pressures ulcers Goals: Patient will remain free from development of additional pressure ulcers Date Initiated: 09/05/2019 Target Resolution Date: 10/03/2019 Goal Status: Active Patient/caregiver will verbalize risk factors for pressure ulcer development Date Initiated: 09/05/2019 Target Resolution Date: 09/26/2019 Goal Status: Active Patient/caregiver will verbalize understanding of pressure ulcer management Date Initiated: 09/05/2019 Target Resolution Date: 10/03/2019 Goal Status: Active Interventions: Assess: immobility, friction, shearing, incontinence upon admission and as needed Provide education on pressure ulcers Notes: GARNERDanthony, Stonebreaker ([id number]) Wound/Skin Impairment Nursing Diagnoses: Impaired tissue integrity Goals: Patient/caregiver will verbalize understanding of skin care regimen Date Initiated: 09/05/2019 Target Resolution Date: 09/26/2019 Goal Status: Active Ulcer/skin breakdown will have a volume reduction of 30% by week 4 Date Initiated: 09/05/2019 Target Resolution Date: 10/03/2019 Goal Status: Active Interventions: Assess ulceration(s) every visit Treatment Activities: Referred to DME Jocelyn Nold for dressing supplies : 09/05/2019 Notes: Electronic Signature(s) Signed: 12/13/2019 9:48:50 AM By: Gretta Cool, BSN, RN, CWS, Kim RN, BSN Entered By: Gretta Cool, BSN, RN, CWS, Kim on 12/12/2019 11:19:59 Firmin, Candelaria [name]  ([id number]) -------------------------------------------------------------------------------- Pain Assessment Details Patient Name: Wegman, [name] Date of Service: 12/12/2019 11:00 AM Medical Record Number: [id number] Patient Account Number: [id number] Date of Birth/Sex: 09/15/1965 (54 y.o. M) Treating RN: Army Melia Primary Care Marilynn Ekstein: [name] Other Clinician: Referring Idona Stach: [name] Treating Skylar Priest/Extender: [name] in Treatment: 14 Active Problems Location of Pain Severity and Description of Pain Patient Has Paino Yes Site Locations Pain Location: Pain in Ulcers Rate the pain. Current Pain Level: 5 Pain Management and Medication Current Pain Management: Electronic Signature(s) Signed: 12/12/2019 11:41:19 AM By: Army Melia Entered By: Army Melia on 12/12/2019 11:03:10 Musleh, Candelaria [name] ([id number]) -------------------------------------------------------------------------------- Patient/Caregiver Education Details Patient Name: Roser, Candelaria [name]. Date of Service: 12/12/2019 11:00 AM Medical Record Number: [id number] Patient Account Number: [id number] Date of Birth/Gender: 03/28/1966 (54 y.o. M) Treating RN: [name] Primary Care Physician: [name] Other Clinician: Referring Physician: [name] Treating Physician/Extender: [name] in Treatment: 14 Education Assessment Education Provided  To: Patient Education Topics Provided Pressure: Handouts: Pressure Ulcers: Care and Offloading Methods: Demonstration, Explain/Verbal Responses: State content correctly Electronic Signature(s) Signed: 12/13/2019 9:48:50 AM By: Gretta Cool, BSN, RN, CWS, Kim RN, BSN Entered By: Gretta Cool, BSN, RN, CWS, Kim on 12/12/2019 11:24:36 Favila, Candelaria [name] ([id number]) -------------------------------------------------------------------------------- Wound Assessment Details Patient Name: Grossberg, [name] Date of Service: 12/12/2019 11:00 AM Medical  Record Number: [id number] Patient Account Number: [id number] Date of Birth/Sex: 12/30/1965 (54 y.o. M) Treating RN: Army Melia Primary Care Robbin Escher: [name] Other Clinician: Referring Lindora Alviar: [name] Treating Nikholas Geffre/Extender: [name] in Treatment: 14 Wound Status Wound Number: 1 Primary Etiology: Abscess Wound Location: Midline Sacrum Wound Status: Open Wounding Event: Gradually Appeared Date Acquired: 07/24/2019 Weeks Of Treatment: 14 Clustered Wound: No Photos Wound Measurements Length: (cm) 1 Width: (cm) 1 Depth: (cm) 2 Area: (cm) 0.785 Volume: (cm) 1.571 % Reduction in Area: 90.5% % Reduction in Volume: 95.8% Epithelialization: None Tunneling: Yes Position (o'clock): 3 Maximum Distance: (cm) 2.2 Undermining: No Wound Description Classification: Full Thickness With Exposed Support Structures Wound Margin: Thickened Exudate Amount: Medium Exudate Type: Serosanguineous Exudate Color: red, brown Foul Odor After Cleansing: Yes Due to Product Use: No Slough/Fibrino No Wound Bed Granulation Amount: Large (67-100%) Exposed Structure Granulation Quality: Red, Hyper-granulation Fascia Exposed: No Necrotic Amount: Small (1-33%) Fat Layer (Subcutaneous Tissue) Exposed: Yes Necrotic Quality: Adherent Slough Tendon Exposed: No Muscle Exposed: No Joint Exposed: No Bone Exposed: No Treatment Notes Wound #1 (Midline Sacrum) Notes Silver collagen packed lightly into entire open space, secured with bordered foam dressing. Schnake, HOSSEIN ETRIS ([id number]) Electronic Signature(s) Signed: 12/12/2019 11:41:19 AM By: Army Melia Signed: 12/13/2019 9:48:50 AM By: Gretta Cool, BSN, RN, CWS, Kim RN, BSN Entered By: Gretta Cool, BSN, RN, CWS, Kim on 12/12/2019 11:19:42 Hallisey, Candelaria [name] ([id number]) -------------------------------------------------------------------------------- Vitals Details Patient Name: Solis, Candelaria [name]. Date of Service: 12/12/2019 11:00  AM Medical Record Number: [id number] Patient Account Number: [id number] Date of Birth/Sex: [birth year] (54 y.o. M) Treating RN: Army Melia Primary Care Chantell Kunkler: [name] Other Clinician: Referring Erendira Crabtree: [name] Treating Sierra Bissonette/Extender: [name] in Treatment: 14 Vital Signs Time Taken: 11:02 Temperature (F): 98.6 Height (in): 72 Pulse (bpm): 71 Weight (lbs): 230 Respiratory Rate (breaths/min): 16 Body Mass Index (BMI): 31.2 Blood Pressure (mmHg): 121/72 Reference Range: 80 - 120 mg / dl Electronic Signature(s) Signed: 12/12/2019 11:41:19 AM By: Army Melia Entered By: Army Melia on 12/12/2019 11:03:01

## 2019-12-14 DIAGNOSIS — L89894 Pressure ulcer of other site, stage 4: Secondary | ICD-10-CM | POA: Diagnosis not present

## 2019-12-14 DIAGNOSIS — M4604 Spinal enthesopathy, thoracic region: Secondary | ICD-10-CM | POA: Diagnosis not present

## 2019-12-14 DIAGNOSIS — M48061 Spinal stenosis, lumbar region without neurogenic claudication: Secondary | ICD-10-CM | POA: Diagnosis not present

## 2019-12-14 DIAGNOSIS — F329 Major depressive disorder, single episode, unspecified: Secondary | ICD-10-CM | POA: Diagnosis not present

## 2019-12-14 DIAGNOSIS — G4700 Insomnia, unspecified: Secondary | ICD-10-CM | POA: Diagnosis not present

## 2019-12-14 DIAGNOSIS — M40204 Unspecified kyphosis, thoracic region: Secondary | ICD-10-CM | POA: Diagnosis not present

## 2019-12-14 DIAGNOSIS — G834 Cauda equina syndrome: Secondary | ICD-10-CM | POA: Diagnosis not present

## 2019-12-14 DIAGNOSIS — M4646 Discitis, unspecified, lumbar region: Secondary | ICD-10-CM | POA: Diagnosis not present

## 2019-12-14 DIAGNOSIS — M5144 Schmorl's nodes, thoracic region: Secondary | ICD-10-CM | POA: Diagnosis not present

## 2019-12-14 DIAGNOSIS — M47816 Spondylosis without myelopathy or radiculopathy, lumbar region: Secondary | ICD-10-CM | POA: Diagnosis not present

## 2019-12-14 DIAGNOSIS — M4804 Spinal stenosis, thoracic region: Secondary | ICD-10-CM | POA: Diagnosis not present

## 2019-12-14 DIAGNOSIS — N319 Neuromuscular dysfunction of bladder, unspecified: Secondary | ICD-10-CM | POA: Diagnosis not present

## 2019-12-14 DIAGNOSIS — L89312 Pressure ulcer of right buttock, stage 2: Secondary | ICD-10-CM | POA: Diagnosis not present

## 2019-12-14 DIAGNOSIS — M21372 Foot drop, left foot: Secondary | ICD-10-CM | POA: Diagnosis not present

## 2019-12-14 DIAGNOSIS — I081 Rheumatic disorders of both mitral and tricuspid valves: Secondary | ICD-10-CM | POA: Diagnosis not present

## 2019-12-14 DIAGNOSIS — M4316 Spondylolisthesis, lumbar region: Secondary | ICD-10-CM | POA: Diagnosis not present

## 2019-12-14 LAB — SARS CORONAVIRUS 2 (TAT 6-24 HRS): SARS Coronavirus 2: NEGATIVE

## 2019-12-17 ENCOUNTER — Encounter
Admission: RE | Disposition: A | Discharge status: Home / Self Care | Payer: Self-pay | Source: Home / Self Care | Attending: Ophthalmology

## 2019-12-17 ENCOUNTER — Ambulatory Visit
Admission: RE | Admit: 2019-12-17 | Discharge: 2019-12-17 | Disposition: A | Discharge status: Home / Self Care | Payer: BC Managed Care – PPO | Attending: Ophthalmology | Admitting: Ophthalmology

## 2019-12-17 ENCOUNTER — Ambulatory Visit: Payer: BC Managed Care – PPO | Admitting: Anesthesiology

## 2019-12-17 ENCOUNTER — Encounter: Payer: Self-pay | Admitting: Ophthalmology

## 2019-12-17 ENCOUNTER — Other Ambulatory Visit: Payer: Self-pay

## 2019-12-17 DIAGNOSIS — Z87891 Personal history of nicotine dependence: Secondary | ICD-10-CM | POA: Insufficient documentation

## 2019-12-17 DIAGNOSIS — H25042 Posterior subcapsular polar age-related cataract, left eye: Secondary | ICD-10-CM | POA: Diagnosis not present

## 2019-12-17 DIAGNOSIS — H2512 Age-related nuclear cataract, left eye: Secondary | ICD-10-CM | POA: Diagnosis not present

## 2019-12-17 DIAGNOSIS — Z96643 Presence of artificial hip joint, bilateral: Secondary | ICD-10-CM | POA: Diagnosis not present

## 2019-12-17 DIAGNOSIS — Z79891 Long term (current) use of opiate analgesic: Secondary | ICD-10-CM | POA: Insufficient documentation

## 2019-12-17 DIAGNOSIS — H25812 Combined forms of age-related cataract, left eye: Secondary | ICD-10-CM | POA: Diagnosis not present

## 2019-12-17 HISTORY — PX: CATARACT EXTRACTION W/PHACO: SHX586

## 2019-12-17 SURGERY — PHACOEMULSIFICATION, CATARACT, WITH IOL INSERTION
Anesthesia: Monitor Anesthesia Care | Site: Eye | Laterality: Left

## 2019-12-17 MED ORDER — SODIUM HYALURONATE 23 MG/ML IO SOLN
INTRAOCULAR | Status: DC | PRN
  Administered 2019-12-17: 0.6 mL via INTRAOCULAR

## 2019-12-17 MED ORDER — LIDOCAINE HCL (PF) 2 % IJ SOLN
INTRAOCULAR | Status: DC | PRN
  Administered 2019-12-17: 1 mL via INTRAOCULAR

## 2019-12-17 MED ORDER — TETRACAINE HCL 0.5 % OP SOLN
1.0000 [drp] | OPHTHALMIC | Status: DC | PRN
  Administered 2019-12-17 (×3): 1 [drp] via OPHTHALMIC

## 2019-12-17 MED ORDER — EPINEPHRINE PF 1 MG/ML IJ SOLN
INTRAOCULAR | Status: DC | PRN
  Administered 2019-12-17: 55 mL via OPHTHALMIC

## 2019-12-17 MED ORDER — ACETAMINOPHEN 325 MG PO TABS: 325.0000 mg | ORAL_TABLET | ORAL | Status: DC | PRN

## 2019-12-17 MED ORDER — ONDANSETRON HCL 4 MG/2ML IJ SOLN: 4.0000 mg | Freq: Once | INTRAMUSCULAR | Status: DC | PRN

## 2019-12-17 MED ORDER — SODIUM HYALURONATE 10 MG/ML IO SOLN
INTRAOCULAR | Status: DC | PRN
  Administered 2019-12-17: 0.55 mL via INTRAOCULAR

## 2019-12-17 MED ORDER — FENTANYL CITRATE (PF) 100 MCG/2ML IJ SOLN
INTRAMUSCULAR | Status: DC | PRN
  Administered 2019-12-17: 100 ug via INTRAVENOUS

## 2019-12-17 MED ORDER — MOXIFLOXACIN HCL 0.5 % OP SOLN
OPHTHALMIC | Status: DC | PRN
  Administered 2019-12-17: 0.2 mL via OPHTHALMIC

## 2019-12-17 MED ORDER — ARMC OPHTHALMIC DILATING DROPS
1.0000 "application " | OPHTHALMIC | Status: DC | PRN
  Administered 2019-12-17 (×3): 1 via OPHTHALMIC

## 2019-12-17 MED ORDER — MIDAZOLAM HCL 2 MG/2ML IJ SOLN
INTRAMUSCULAR | Status: DC | PRN
  Administered 2019-12-17: 1 mg via INTRAVENOUS
  Administered 2019-12-17: 2 mg via INTRAVENOUS
  Administered 2019-12-17: 1 mg via INTRAVENOUS

## 2019-12-17 MED ORDER — ACETAMINOPHEN 160 MG/5ML PO SOLN: 325.0000 mg | ORAL | Status: DC | PRN

## 2019-12-17 SURGICAL SUPPLY — 20 items
CANNULA ANT/CHMB 27G (MISCELLANEOUS) ×2 IMPLANT
CANNULA ANT/CHMB 27GA (MISCELLANEOUS) ×6 IMPLANT
DISSECTOR HYDRO NUCLEUS 50X22 (MISCELLANEOUS) ×3 IMPLANT
GLOVE SURG LX 7.5 STRW (GLOVE) ×2
GLOVE SURG LX STRL 7.5 STRW (GLOVE) ×1 IMPLANT
GLOVE SURG SYN 8.5  E (GLOVE) ×6
GLOVE SURG SYN 8.5 E (GLOVE) ×2 IMPLANT
GLOVE SURG SYN 8.5 PF PI (GLOVE) ×1 IMPLANT
GOWN STRL REUS W/ TWL LRG LVL3 (GOWN DISPOSABLE) ×2 IMPLANT
GOWN STRL REUS W/TWL LRG LVL3 (GOWN DISPOSABLE) ×6
LENS IOL DIOP 20.5 (Intraocular Lens) ×3 IMPLANT
LENS IOL TECNIS MONO 20.5 (Intraocular Lens) IMPLANT
MARKER SKIN DUAL TIP RULER LAB (MISCELLANEOUS) ×3 IMPLANT
PACK DR. KING ARMS (PACKS) ×3 IMPLANT
PACK EYE AFTER SURG (MISCELLANEOUS) ×3 IMPLANT
PACK OPTHALMIC (MISCELLANEOUS) ×3 IMPLANT
SYR 3ML LL SCALE MARK (SYRINGE) ×3 IMPLANT
SYR TB 1ML LUER SLIP (SYRINGE) ×3 IMPLANT
WATER STERILE IRR 250ML POUR (IV SOLUTION) ×3 IMPLANT
WIPE NON LINTING 3.25X3.25 (MISCELLANEOUS) ×3 IMPLANT

## 2019-12-17 NOTE — Op Note (Signed)
OPERATIVE NOTE  BIGE BARRIENTOS [id number] 12/17/2019   PREOPERATIVE DIAGNOSIS:  Nuclear sclerotic cataract left eye.  H25.12   POSTOPERATIVE DIAGNOSIS:    Nuclear sclerotic cataract left eye.     PROCEDURE:  Phacoemusification with posterior chamber intraocular lens placement of the left eye   LENS:   Implant Name Type Inv. Item Serial No. Manufacturer Lot No. LRB No. Used Action  LENS IOL DIOP 20.5 - [id number] Intraocular Lens LENS IOL DIOP 20.5 [id number]  Left 1 Implanted      Procedure(s) with comments: CATARACT EXTRACTION PHACO AND INTRAOCULAR LENS PLACEMENT (IOC) LEFT (Left) - 3.61 0:26.9  DCB00 +20.5   ULTRASOUND TIME: 0 minutes 26.9 seconds.  CDE 3.61   SURGEON:  Benay Pillow, MD, MPH   ANESTHESIA:  Topical with tetracaine drops augmented with 1% preservative-free intracameral lidocaine.  ESTIMATED BLOOD LOSS: <1 mL   COMPLICATIONS:  None.   DESCRIPTION OF PROCEDURE:  The patient was identified in the holding room and transported to the operating room and placed in the supine position under the operating microscope.  The left eye was identified as the operative eye and it was prepped and draped in the usual sterile ophthalmic fashion.   A 1.0 millimeter clear-corneal paracentesis was made at the 5:00 position. 0.5 ml of preservative-free 1% lidocaine with epinephrine was injected into the anterior chamber.  The anterior chamber was filled with Healon 5 viscoelastic.  A 2.4 millimeter keratome was used to make a near-clear corneal incision at the 2:00 position.  A curvilinear capsulorrhexis was made with a cystotome and capsulorrhexis forceps.  Balanced salt solution was used to hydrodissect and hydrodelineate the nucleus.   Phacoemulsification was then used in stop and chop fashion to remove the lens nucleus and epinucleus.  The remaining cortex was then removed using the irrigation and aspiration handpiece. Healon was then placed into the capsular bag to distend it  for lens placement.  A lens was then injected into the capsular bag.  The remaining viscoelastic was aspirated.   Wounds were hydrated with balanced salt solution.  The anterior chamber was inflated to a physiologic pressure with balanced salt solution.  Intracameral vigamox 0.1 mL undiltued was injected into the eye and a drop placed onto the ocular surface.  No wound leaks were noted.  The patient was taken to the recovery room in stable condition without complications of anesthesia or surgery  Benay Pillow 12/17/2019, 12:49 PM

## 2019-12-17 NOTE — Transfer of Care (Signed)
Immediate Anesthesia Transfer of Care Note  Patient: [name]  Procedure(s) Performed: CATARACT EXTRACTION PHACO AND INTRAOCULAR LENS PLACEMENT (IOC) LEFT (Left Eye)  Patient Location: PACU  Anesthesia Type: MAC  Level of Consciousness: awake, alert  and patient cooperative  Airway and Oxygen Therapy: Patient Spontanous Breathing and Patient connected to supplemental oxygen  Post-op Assessment: Post-op Vital signs reviewed, Patient's Cardiovascular Status Stable, Respiratory Function Stable, Patent Airway and No signs of Nausea or vomiting  Post-op Vital Signs: Reviewed and stable  Complications: No apparent anesthesia complications

## 2019-12-17 NOTE — H&P (Signed)

## 2019-12-17 NOTE — Anesthesia Postprocedure Evaluation (Signed)
Anesthesia Post Note  Patient: [name]  Procedure(s) Performed: CATARACT EXTRACTION PHACO AND INTRAOCULAR LENS PLACEMENT (IOC) LEFT (Left Eye)     Patient location during evaluation: PACU Anesthesia Type: MAC Level of consciousness: awake and alert Pain management: pain level controlled Vital Signs Assessment: post-procedure vital signs reviewed and stable Respiratory status: spontaneous breathing, nonlabored ventilation, respiratory function stable and patient connected to nasal cannula oxygen Cardiovascular status: stable and blood pressure returned to baseline Postop Assessment: no apparent nausea or vomiting Anesthetic complications: no    Alisa Graff

## 2019-12-17 NOTE — Anesthesia Procedure Notes (Signed)
Procedure Name: MAC Performed by: Jaidy Cottam, CRNA Pre-anesthesia Checklist: Patient identified, Emergency Drugs available, Suction available, Timeout performed and Patient being monitored Patient Re-evaluated:Patient Re-evaluated prior to induction Oxygen Delivery Method: Nasal cannula Placement Confirmation: positive ETCO2       

## 2019-12-17 NOTE — Anesthesia Preprocedure Evaluation (Signed)
Anesthesia Evaluation  Patient identified by MRN, date of birth, ID band Patient awake    Reviewed: Allergy & Precautions, H&P , NPO status , Patient's Chart, lab work & pertinent test results, reviewed documented beta blocker date and time   Airway Mallampati: II  TM Distance: >3 FB Neck ROM: full    Dental no notable dental hx.    Pulmonary neg pulmonary ROS, former smoker,    Pulmonary exam normal breath sounds clear to auscultation       Cardiovascular Exercise Tolerance: Good negative cardio ROS   Rhythm:regular Rate:Normal     Neuro/Psych  Neuromuscular disease (h/o cauda equina syndrome s/p lami) negative psych ROS   GI/Hepatic negative GI ROS, Neg liver ROS, H/o alcohol abuse, now in remission   Endo/Other  negative endocrine ROS  Renal/GU negative Renal ROS  negative genitourinary   Musculoskeletal   Abdominal   Peds  Hematology negative hematology ROS (+)   Anesthesia Other Findings   Reproductive/Obstetrics negative OB ROS                             Anesthesia Physical Anesthesia Plan  ASA: II  Anesthesia Plan: MAC   Post-op Pain Management:    Induction:   PONV Risk Score and Plan: 1 and Treatment may vary due to age or medical condition  Airway Management Planned:   Additional Equipment:   Intra-op Plan:   Post-operative Plan:   Informed Consent: I have reviewed the patients History and Physical, chart, labs and discussed the procedure including the risks, benefits and alternatives for the proposed anesthesia with the patient or authorized representative who has indicated his/her understanding and acceptance.     Dental Advisory Given  Plan Discussed with: CRNA  Anesthesia Plan Comments:         Anesthesia Quick Evaluation

## 2019-12-18 ENCOUNTER — Encounter: Payer: Self-pay | Admitting: *Deleted

## 2019-12-19 DIAGNOSIS — G834 Cauda equina syndrome: Secondary | ICD-10-CM | POA: Diagnosis not present

## 2019-12-19 DIAGNOSIS — M4316 Spondylolisthesis, lumbar region: Secondary | ICD-10-CM | POA: Diagnosis not present

## 2019-12-19 DIAGNOSIS — M4804 Spinal stenosis, thoracic region: Secondary | ICD-10-CM | POA: Diagnosis not present

## 2019-12-19 DIAGNOSIS — M48061 Spinal stenosis, lumbar region without neurogenic claudication: Secondary | ICD-10-CM | POA: Diagnosis not present

## 2019-12-19 DIAGNOSIS — G4700 Insomnia, unspecified: Secondary | ICD-10-CM | POA: Diagnosis not present

## 2019-12-19 DIAGNOSIS — M47816 Spondylosis without myelopathy or radiculopathy, lumbar region: Secondary | ICD-10-CM | POA: Diagnosis not present

## 2019-12-19 DIAGNOSIS — L89894 Pressure ulcer of other site, stage 4: Secondary | ICD-10-CM | POA: Diagnosis not present

## 2019-12-19 DIAGNOSIS — M21372 Foot drop, left foot: Secondary | ICD-10-CM | POA: Diagnosis not present

## 2019-12-19 DIAGNOSIS — N319 Neuromuscular dysfunction of bladder, unspecified: Secondary | ICD-10-CM | POA: Diagnosis not present

## 2019-12-19 DIAGNOSIS — M40204 Unspecified kyphosis, thoracic region: Secondary | ICD-10-CM | POA: Diagnosis not present

## 2019-12-19 DIAGNOSIS — I081 Rheumatic disorders of both mitral and tricuspid valves: Secondary | ICD-10-CM | POA: Diagnosis not present

## 2019-12-19 DIAGNOSIS — M5144 Schmorl's nodes, thoracic region: Secondary | ICD-10-CM | POA: Diagnosis not present

## 2019-12-19 DIAGNOSIS — M4604 Spinal enthesopathy, thoracic region: Secondary | ICD-10-CM | POA: Diagnosis not present

## 2019-12-19 DIAGNOSIS — F329 Major depressive disorder, single episode, unspecified: Secondary | ICD-10-CM | POA: Diagnosis not present

## 2019-12-19 DIAGNOSIS — L89312 Pressure ulcer of right buttock, stage 2: Secondary | ICD-10-CM | POA: Diagnosis not present

## 2019-12-19 DIAGNOSIS — M4646 Discitis, unspecified, lumbar region: Secondary | ICD-10-CM | POA: Diagnosis not present

## 2019-12-21 DIAGNOSIS — M4316 Spondylolisthesis, lumbar region: Secondary | ICD-10-CM | POA: Diagnosis not present

## 2019-12-21 DIAGNOSIS — G834 Cauda equina syndrome: Secondary | ICD-10-CM | POA: Diagnosis not present

## 2019-12-21 DIAGNOSIS — M21372 Foot drop, left foot: Secondary | ICD-10-CM | POA: Diagnosis not present

## 2019-12-21 DIAGNOSIS — M40204 Unspecified kyphosis, thoracic region: Secondary | ICD-10-CM | POA: Diagnosis not present

## 2019-12-21 DIAGNOSIS — M4646 Discitis, unspecified, lumbar region: Secondary | ICD-10-CM | POA: Diagnosis not present

## 2019-12-21 DIAGNOSIS — M48061 Spinal stenosis, lumbar region without neurogenic claudication: Secondary | ICD-10-CM | POA: Diagnosis not present

## 2019-12-21 DIAGNOSIS — G4700 Insomnia, unspecified: Secondary | ICD-10-CM | POA: Diagnosis not present

## 2019-12-21 DIAGNOSIS — L89312 Pressure ulcer of right buttock, stage 2: Secondary | ICD-10-CM | POA: Diagnosis not present

## 2019-12-21 DIAGNOSIS — L89894 Pressure ulcer of other site, stage 4: Secondary | ICD-10-CM | POA: Diagnosis not present

## 2019-12-21 DIAGNOSIS — M5144 Schmorl's nodes, thoracic region: Secondary | ICD-10-CM | POA: Diagnosis not present

## 2019-12-21 DIAGNOSIS — F329 Major depressive disorder, single episode, unspecified: Secondary | ICD-10-CM | POA: Diagnosis not present

## 2019-12-21 DIAGNOSIS — M47816 Spondylosis without myelopathy or radiculopathy, lumbar region: Secondary | ICD-10-CM | POA: Diagnosis not present

## 2019-12-21 DIAGNOSIS — I081 Rheumatic disorders of both mitral and tricuspid valves: Secondary | ICD-10-CM | POA: Diagnosis not present

## 2019-12-21 DIAGNOSIS — N319 Neuromuscular dysfunction of bladder, unspecified: Secondary | ICD-10-CM | POA: Diagnosis not present

## 2019-12-21 DIAGNOSIS — M4604 Spinal enthesopathy, thoracic region: Secondary | ICD-10-CM | POA: Diagnosis not present

## 2019-12-21 DIAGNOSIS — M4804 Spinal stenosis, thoracic region: Secondary | ICD-10-CM | POA: Diagnosis not present

## 2019-12-25 ENCOUNTER — Ambulatory Visit (INDEPENDENT_AMBULATORY_CARE_PROVIDER_SITE_OTHER): Payer: BC Managed Care – PPO | Admitting: Psychology

## 2019-12-25 DIAGNOSIS — F4323 Adjustment disorder with mixed anxiety and depressed mood: Secondary | ICD-10-CM | POA: Diagnosis not present

## 2019-12-26 ENCOUNTER — Encounter: Payer: BC Managed Care – PPO | Attending: Internal Medicine | Admitting: Internal Medicine

## 2019-12-26 ENCOUNTER — Other Ambulatory Visit: Payer: Self-pay

## 2019-12-26 DIAGNOSIS — Z96642 Presence of left artificial hip joint: Secondary | ICD-10-CM | POA: Insufficient documentation

## 2019-12-26 DIAGNOSIS — X58XXXD Exposure to other specified factors, subsequent encounter: Secondary | ICD-10-CM | POA: Insufficient documentation

## 2019-12-26 DIAGNOSIS — L89154 Pressure ulcer of sacral region, stage 4: Secondary | ICD-10-CM | POA: Insufficient documentation

## 2019-12-26 DIAGNOSIS — M353 Polymyalgia rheumatica: Secondary | ICD-10-CM | POA: Diagnosis not present

## 2019-12-26 DIAGNOSIS — L0231 Cutaneous abscess of buttock: Secondary | ICD-10-CM | POA: Diagnosis not present

## 2019-12-26 DIAGNOSIS — T8131XD Disruption of external operation (surgical) wound, not elsewhere classified, subsequent encounter: Secondary | ICD-10-CM | POA: Diagnosis not present

## 2019-12-26 DIAGNOSIS — T8131XA Disruption of external operation (surgical) wound, not elsewhere classified, initial encounter: Secondary | ICD-10-CM | POA: Diagnosis not present

## 2019-12-27 NOTE — Progress Notes (Signed)
Susi, JACKSTON MONTANEZ ([id number]) Visit Report for 12/26/2019 HPI Details Patient Name: [name], [name] Date of Service: 12/26/2019 11:00 AM Medical Record Number: [id number] Patient Account Number: [id number] Date of Birth/Sex: 08/25/1965 (54 y.o. M) Treating RN: [name] Primary Care Provider: [name] Other Clinician: Referring Provider: [name] Treating Provider/Extender: [name] in Treatment: 16 History of Present Illness HPI Description: ADMISSION 09/05/2019 This is a 54 year old man who has a complicated history which started with acute low back pain sometime in September. Saw his primary doctor had a CT scan of the abdomen and pelvis that was normal. By late December he was discovered to have a lumbar epidural abscess with cauda equina syndrome. He was admitted to hospital and had a decompressive laminectomy at L3/L4/L5 blood cultures at the time showed MSSA he received IV nafcillin. He went to rehab from 1/5 through 1/15 there he was noted to have an "pressure injury in the skin" although I did not see much more about this in the discharge instructions. Unfortunately this wound became infected after he was discharged home. He required readmission from 1/25 through 2/2 with sepsis, discovered to have a right psoas abscess as well as an infected decubitus ulcer. He had a bedside debridement by general surgery on 1/25 interventional radiology drain the right psoas abscess. General surgery recommended 3 times daily wet- to-dry dressings and an air mattress. He has since been discharged home. He is mobile with a walker. They are using 3 times daily wet-to-dry dressings. According to his family he is eating well. He has a protein supplement but he he is not taking it. He is currently on ampicillin sulbactam as recommended by [name] this is due to be finished currently on 2/22 although there seem to be some suggestion it might go longer. They say he had blood work 2  days ago which I will review. The last lab work I saw was a sedimentation rate of 81 and a C-reactive protein of 212 on 1/25. I did not see any imaging studies of the underlying bone although I will need to review the CT scans that he has had Past medical history, polymyalgia rheumatica, left total hip replacement in 2018 for avascular necrosis, cellulitis of the scrotum in July 2020, history of alcohol abuse but that is not currently problematic 09/12/2019; patient readmitted to the clinic last week. I put him on a wet-to-dry dressing with silver sorb gel to the larger wound area on his coccyx and proximal right buttocks. These are actually connected. In general the wound surfaces look quite good although they are deep and there is a large amount of undermining. There is no exposed bone. Last lab work on 2/8 that was ordered by [name] showed a CRP of 68.5 and a sedimentation rate of greater than 130. The CRP was elevated versus 18.3 on 1/10 although it is difficult to interpret these necessarily in somebody with polymyalgia rheumatica. His white count was 15.7 hemoglobin 10.7 The patient is offloading this is much as he can says that he is eating and drinking well which seems verified by his family member. 2/24; patient with a large wound on the lower sacrum and proximal right buttocks. There is a bridge of overlying skin but the wounds are connected and there is wide undermining. We are using wet-to-dry dressings. I have reviewed [name] notes of 09/17/2019 he is remove the PICC line. He had a prolonged course of IV antibiotics for the MSSA bacteremia, lumbar infection,  psoas abscess and a necrotic sacral wound. Interestingly I noticed the same history of PMR in [name] that [name] comments on. I went over the case again with the patient and his daughter he is totally unfamiliar with anything to do with polymyalgia rheumatica or its obvious symptoms that I also described. I  told him that this is not a diagnosis that most doctors would make and put in an E HR without documentation but he is just not familiar with it and neither his his daughter. For now we are using wet-to-dry dressings. I think it is time to try a wound VAC now that we are certain about underlying infection issues 3/3; he arrives today with his wound actually looks somewhat better. Two small areas with a bridge of skin actually have contracted I believe there is less depth. There is still extensive undermining. He got his VAC machine yesterday but they have still not put it on. 3/10; the orifice of the actual wound is contracting faster than the undermining dimensions of the wound. This is probably soon going to make this difficult to continue with the [facility] 10/15/2019 upon evaluation today patient appears to be doing well in general with regard to his wound. Apparently this looks good although I have not really seen him he has been seen by [name] up to this point. Nonetheless he is currently on a wound VAC and apparently has been having some odor over the past several days which is why comes in for evaluation earlier today. I am going obtain a wound culture and then subsequently we will likely place the patient on an antibiotic at this point to try to help out with the odor which I presume is coming from a bacterial infection. The patient has been off of all antibiotics for the past month having ended on 22 February. With that being said he is not having any increased pain or anything else going on at this point which is good news. He is not allergic to any antibiotics that he is aware of. 4/7; it has been almost a month since I saw this patient although he was here 2 weeks ago. He had a wound VAC on this wound. I note that he was treated for infection with 2 weeks of Levaquin. We had a report recently from home health that there was an extensive rash apparently in the periwound and they took the [facility]  off for a few days however the rash appears to have cleaned up and the wound VAC was back on on his presentation today. Since I saw this last the year is been considerable reduction in overall wound area 4/21; 2-week follow-up. Still using the wound VAC although I am not completely certain that [place] home health is putting this on continuously per discussion with the patient's family. He still has the 2 connecting wounds. The larger area is more midline the smaller area to the right. There is no undermining beyond the smaller wound that I was able to determine. However the larger wound slopes towards that area. We have some improvement in the overall wound measurements still using the wound VAC. 5/5; 2-week follow-up. We held the wound VAC last week largely as the wound and closed down. There is still 2 open areas the more midline area and the area towards the right separated by normal skin. We are using silver alginate 5/19; 2-week follow-up. We discontinued the wound VAC the area has closed down tremendously he now has  2 small open areas separated by normal skin. Not much change using silver alginate from last time. The 2 areas connect but there is not a lot of additional undermining here. No evidence of surrounding infection. The patient has been compliant with offloading 6/2; 2-week follow-up. The patient has 2 small open areas the original connecting wounds this is come in fairly dramatically since the last time he Empie, STAVON BAZAN. ([id number]) is here. I can no longer prove a connection undermining between the 2 areas which is a dramatic improvement. We have been using silver collagen. His daughter is changing the dressing Electronic Signature(s) Signed: 12/26/2019 4:41:32 PM By: Linton Ham MD Entered By: Linton Ham on 12/26/2019 11:49:26 Ihnen, [name] ([id number]) -------------------------------------------------------------------------------- Physical Exam Details Patient Name:  Nier, [name] Date of Service: 12/26/2019 11:00 AM Medical Record Number: [id number] Patient Account Number: [id number] Date of Birth/Sex: 02/24/1966 (54 y.o. M) Treating RN: [name] Primary Care Provider: [name] Other Clinician: Referring Provider: [name] Treating Provider/Extender: [name] in Treatment: 16 Constitutional Sitting or standing Blood Pressure is within target range for patient.. Pulse regular and within target range for patient.Marland Kitchen Respirations regular, non- labored and within target range.. Temperature is normal and within the target range for the patient.Marland Kitchen appears in no distress. Notes Wound exam; 2 open areas that are no longer connected. In fact the area more centrally I do not think has any measurable depth. The remaining wound which was the larger of the open areas still has some depth. Some hyper granulation and maceration around the wound I applied silver nitrate to this area. Electronic Signature(s) Signed: 12/26/2019 4:41:32 PM By: Linton Ham MD Entered By: Linton Ham on 12/26/2019 11:50:29 W[place]stine, [name] ([id number]) -------------------------------------------------------------------------------- Physician Orders Details Patient Name: Henricks, [name] Date of Service: 12/26/2019 11:00 AM Medical Record Number: [id number] Patient Account Number: [id number] Date of Birth/Sex: 05/01/1966 (54 y.o. M) Treating RN: [name] Primary Care Provider: [name] Other Clinician: Referring Provider: [name] Treating Provider/Extender: [name] in Treatment: 16 Verbal / Phone Orders: No Diagnosis Coding Wound Cleansing Wound #1 Midline Sacrum o Clean wound with Normal Saline. Anesthetic (add to Medication List) Wound #1 Midline Sacrum o Topical Lidocaine 4% cream applied to wound bed prior to debridement (In Clinic Only). Skin Barriers/Peri-Wound Care Wound #1 Midline Sacrum o Skin Prep Primary  Wound Dressing Wound #1 Midline Sacrum o Other: - 1 Endoform; moisten with saline (may use collagen if you do not have Endoform) Secondary Dressing Wound #1 Midline Sacrum o Silver Alginate - 2 o Boardered Foam Dressing - 3 Dressing Change Frequency Wound #1 Midline Sacrum o Change Dressing Monday, Wednesday, Friday - Homeheath Follow-up Appointments Wound #1 Midline Sacrum o Return Appointment in 2 weeks. Off-Loading Wound #1 Midline Sacrum o Other: - NO Pressure on the wounded areas. Additional Orders / Instructions Wound #1 Midline Sacrum o Increase protein intake. - Powder protein supplement, multivitamin o Activity as tolerated Home Health Wound #1 Midline [place] Visits - [place] Nurse may visit PRN to address patientos wound care needs. o FACE TO FACE ENCOUNTER: MEDICARE and MEDICAID PATIENTS: I certify that this patient is under my care and that I had a face-to-face encounter that meets the physician face-to-face encounter requirements with this patient on this date. The encounter with the patient was in whole or in part for the following MEDICAL CONDITION: (primary reason for [place]) MEDICAL NECESSITY: I certify, that based  on my findings, NURSING services are a medically necessary home health service. HOME BOUND STATUS: I certify that my clinical findings support that this patient is homebound (i.e., Due to illness or injury, pt requires aid of supportive devices such as crutches, cane, wheelchairs, walkers, the use of special transportation or the assistance of another person to leave their place of residence. There is a normal inability to leave the [place], TYGER HAWKES. ([id number]) home and doing so requires considerable and taxing effort. Other absences are for medical reasons / religious services and are infrequent or of short duration when for other reasons). o If current dressing causes regression in  wound condition, may D/C ordered dressing product/s and apply Normal Saline Moist Dressing daily until next [place] / Other MD appointment. [place] of regression in wound condition at [phone]. o Please direct any NON-WOUND related issues/requests for orders to patient's Primary Care Physician Electronic Signature(s) Signed: 12/26/2019 4:41:32 PM By: Linton Ham MD Signed: 12/27/2019 1:40:27 PM By: Gretta Cool, BSN, RN, CWS, Kim RN, BSN Entered By: Gretta Cool, BSN, RN, CWS, Kim on 12/26/2019 11:27:55 All, [name] ([id number]) -------------------------------------------------------------------------------- Problem List Details Patient Name: Sneath, JAYDRIAN GLAD. Date of Service: 12/26/2019 11:00 AM Medical Record Number: [id number] Patient Account Number: [id number] Date of Birth/Sex: Aug 14, 1965 (54 y.o. M) Treating RN: [name] Primary Care Provider: [name] Other Clinician: Referring Provider: [name] Treating Provider/Extender: [name] in Treatment: 16 Active Problems ICD-10 Encounter Code Description Active Date MDM Diagnosis L89.154 Pressure ulcer of sacral region, stage 4 09/05/2019 No Yes T81.31XD Disruption of external operation (surgical) wound, not elsewhere 09/05/2019 No Yes classified, subsequent encounter L02.31 Cutaneous abscess of buttock 09/05/2019 No Yes Inactive Problems Resolved Problems Electronic Signature(s) Signed: 12/26/2019 4:41:32 PM By: Linton Ham MD Entered By: Linton Ham on 12/26/2019 11:44:42 Grzywacz, [name] ([id number]) -------------------------------------------------------------------------------- Progress Note Details Patient Name: Kirkwood, [name] Date of Service: 12/26/2019 11:00 AM Medical Record Number: [id number] Patient Account Number: [id number] Date of Birth/Sex: 19-Apr-1966 (54 y.o. M) Treating RN: [name] Primary Care Provider: [name] Other Clinician: Referring Provider:  [name] Treating Provider/Extender: [name] in Treatment: 16 Subjective History of Present Illness (HPI) ADMISSION 09/05/2019 This is a 54 year old man who has a complicated history which started with acute low back pain sometime in September. Saw his primary doctor had a CT scan of the abdomen and pelvis that was normal. By late December he was discovered to have a lumbar epidural abscess with cauda equina syndrome. He was admitted to hospital and had a decompressive laminectomy at L3/L4/L5 blood cultures at the time showed MSSA he received IV nafcillin. He went to rehab from 1/5 through 1/15 there he was noted to have an "pressure injury in the skin" although I did not see much more about this in the discharge instructions. Unfortunately this wound became infected after he was discharged home. He required readmission from 1/25 through 2/2 with sepsis, discovered to have a right psoas abscess as well as an infected decubitus ulcer. He had a bedside debridement by general surgery on 1/25 interventional radiology drain the right psoas abscess. General surgery recommended 3 times daily wet- to-dry dressings and an air mattress. He has since been discharged home. He is mobile with a walker. They are using 3 times daily wet-to-dry dressings. According to his family he is eating well. He has a protein supplement but he he is not taking it. He is currently on ampicillin sulbactam as recommended  by [name] this is due to be finished currently on 2/22 although there seem to be some suggestion it might go longer. They say he had blood work 2 days ago which I will review. The last lab work I saw was a sedimentation rate of 81 and a C-reactive protein of 212 on 1/25. I did not see any imaging studies of the underlying bone although I will need to review the CT scans that he has had Past medical history, polymyalgia rheumatica, left total hip replacement in 2018 for avascular  necrosis, cellulitis of the scrotum in July 2020, history of alcohol abuse but that is not currently problematic 09/12/2019; patient readmitted to the clinic last week. I put him on a wet-to-dry dressing with silver sorb gel to the larger wound area on his coccyx and proximal right buttocks. These are actually connected. In general the wound surfaces look quite good although they are deep and there is a large amount of undermining. There is no exposed bone. Last lab work on 2/8 that was ordered by [name] showed a CRP of 68.5 and a sedimentation rate of greater than 130. The CRP was elevated versus 18.3 on 1/10 although it is difficult to interpret these necessarily in somebody with polymyalgia rheumatica. His white count was 15.7 hemoglobin 10.7 The patient is offloading this is much as he can says that he is eating and drinking well which seems verified by his family member. 2/24; patient with a large wound on the lower sacrum and proximal right buttocks. There is a bridge of overlying skin but the wounds are connected and there is wide undermining. We are using wet-to-dry dressings. I have reviewed [name] notes of 09/17/2019 he is remove the PICC line. He had a prolonged course of IV antibiotics for the MSSA bacteremia, lumbar infection, psoas abscess and a necrotic sacral wound. Interestingly I noticed the same history of PMR in [place] link that [name] comments on. I went over the case again with the patient and his daughter he is totally unfamiliar with anything to do with polymyalgia rheumatica or its obvious symptoms that I also described. I told him that this is not a diagnosis that most doctors would make and put in an E HR without documentation but he is just not familiar with it and neither his his daughter. For now we are using wet-to-dry dressings. I think it is time to try a wound VAC now that we are certain about underlying infection issues 3/3; he arrives today  with his wound actually looks somewhat better. Two small areas with a bridge of skin actually have contracted I believe there is less depth. There is still extensive undermining. He got his VAC machine yesterday but they have still not put it on. 3/10; the orifice of the actual wound is contracting faster than the undermining dimensions of the wound. This is probably soon going to make this difficult to continue with the [facility] 10/15/2019 upon evaluation today patient appears to be doing well in general with regard to his wound. Apparently this looks good although I have not really seen him he has been seen by [name] up to this point. Nonetheless he is currently on a wound VAC and apparently has been having some odor over the past several days which is why comes in for evaluation earlier today. I am going obtain a wound culture and then subsequently we will likely place the patient on an antibiotic at this point to try  to help out with the odor which I presume is coming from a bacterial infection. The patient has been off of all antibiotics for the past month having ended on 22 February. With that being said he is not having any increased pain or anything else going on at this point which is good news. He is not allergic to any antibiotics that he is aware of. 4/7; it has been almost a month since I saw this patient although he was here 2 weeks ago. He had a wound VAC on this wound. I note that he was treated for infection with 2 weeks of Levaquin. We had a report recently from home health that there was an extensive rash apparently in the periwound and they took the [facility] off for a few days however the rash appears to have cleaned up and the wound VAC was back on on his presentation today. Since I saw this last the year is been considerable reduction in overall wound area 4/21; 2-week follow-up. Still using the wound VAC although I am not completely certain that [place] home health is putting this on  continuously per discussion with the patient's family. He still has the 2 connecting wounds. The larger area is more midline the smaller area to the right. There is no undermining beyond the smaller wound that I was able to determine. However the larger wound slopes towards that area. We have some improvement in the overall wound measurements still using the wound VAC. 5/5; 2-week follow-up. We held the wound VAC last week largely as the wound and closed down. There is still 2 open areas the more midline area and the area towards the right separated by normal skin. We are using silver alginate 5/19; 2-week follow-up. We discontinued the wound VAC the area has closed down tremendously he now has 2 small open areas separated by normal skin. Not much change using silver alginate from last time. The 2 areas connect but there is not a lot of additional undermining here. No evidence of surrounding infection. The patient has been compliant with offloading 6/2; 2-week follow-up. The patient has 2 small open areas the original connecting wounds this is come in fairly dramatically since the last time he is here. I can no longer prove a connection undermining between the 2 areas which is a dramatic improvement. We have been using silver collagen. His daughter is changing the dressing [place], AZELL WEGLEITNER. ([id number]) Objective Constitutional Sitting or standing Blood Pressure is within target range for patient.. Pulse regular and within target range for patient.Marland Kitchen Respirations regular, non- labored and within target range.. Temperature is normal and within the target range for the patient.Marland Kitchen appears in no distress. Vitals Time Taken: 11:12 AM, Height: 72 in, Weight: 230 lbs, BMI: 31.2, Temperature: 97.9 F, Pulse: 83 bpm, Respiratory Rate: 16 breaths/min, Blood Pressure: 114/77 mmHg. General Notes: Wound exam; 2 open areas that are no longer connected. In fact the area more centrally I do not think has any  measurable depth. The remaining wound which was the larger of the open areas still has some depth. Some hyper granulation and maceration around the wound I applied silver nitrate to this area. Integumentary (Hair, Skin) Wound #1 status is Open. Original cause of wound was Gradually Appeared. The wound is located on the Midline Sacrum. The wound measures 0.8cm length x 2cm width x 1.2cm depth; 1.257cm^2 area and 1.508cm^3 volume. There is Fat Layer (Subcutaneous Tissue) Exposed exposed. There is no undermining noted, however, there is tunneling  at 3:00 with a maximum distance of 1.1cm. There is a medium amount of serosanguineous drainage noted. Foul odor after cleansing was noted. The wound margin is thickened. There is large (67-100%) red, hyper - granulation within the wound bed. There is a small (1-33%) amount of necrotic tissue within the wound bed including Adherent Slough. Assessment Active Problems ICD-10 Pressure ulcer of sacral region, stage 4 Disruption of external operation (surgical) wound, not elsewhere classified, subsequent encounter Cutaneous abscess of buttock Plan Wound Cleansing: Wound #1 Midline Sacrum: Clean wound with Normal Saline. Anesthetic (add to Medication List): Wound #1 Midline Sacrum: Topical Lidocaine 4% cream applied to wound bed prior to debridement (In Clinic Only). Skin Barriers/Peri-Wound Care: Wound #1 Midline Sacrum: Skin Prep Primary Wound Dressing: Wound #1 Midline Sacrum: Other: - 1 Endoform; moisten with saline (may use collagen if you do not have Endoform) Secondary Dressing: Wound #1 Midline Sacrum: Silver Alginate - 2 Boardered Foam Dressing - 3 Dressing Change Frequency: Wound #1 Midline Sacrum: Change Dressing Monday, Wednesday, Friday - Homeheath Follow-up Appointments: Wound #1 Midline Sacrum: Return Appointment in 2 weeks. Off-Loading: Wound #1 Midline Sacrum: Other: - NO Pressure on the wounded areas. Additional Orders /  Instructions: Wound #1 Midline Sacrum: Mielke, Jud R. ([id number]) Increase protein intake. - Powder protein supplement, multivitamin Activity as tolerated Home Health: Wound #1 Midline Sacrum: Continue Home Health Visits - [place] Nurse may visit PRN to address patient s wound care needs. FACE TO FACE ENCOUNTER: MEDICARE and MEDICAID PATIENTS: I certify that this patient is under my care and that I had a face-to-face encounter that meets the physician face-to-face encounter requirements with this patient on this date. The encounter with the patient was in whole or in part for the following MEDICAL CONDITION: (primary reason for [place]) MEDICAL NECESSITY: I certify, that based on my findings, NURSING services are a medically necessary home health service. HOME BOUND STATUS: I certify that my clinical findings support that this patient is homebound (i.e., Due to illness or injury, pt requires aid of supportive devices such as crutches, cane, wheelchairs, walkers, the use of special transportation or the assistance of another person to leave their place of residence. There is a normal inability to leave the home and doing so requires considerable and taxing effort. Other absences are for medical reasons / religious services and are infrequent or of short duration when for other reasons). If current dressing causes regression in wound condition, may D/C ordered dressing product/s and apply Normal Saline Moist Dressing daily until next [place] / Other MD appointment. [place] of regression in wound condition at [phone]. Please direct any NON-WOUND related issues/requests for orders to patient's Primary Care Physician 1. We change the primary dressing to endoform to see if we can get this area to epithelialize covering this with a layer of alginate. 2. I could not prove any depth to the second of the open areas here and this may be healed we  will have a better look at this next time. Electronic Signature(s) Signed: 12/26/2019 4:41:32 PM By: Linton Ham MD Entered By: Linton Ham on 12/26/2019 11:51:22 Allum, [name] ([id number]) -------------------------------------------------------------------------------- [place] Details Patient Name: Villamizar, [name] Date of Service: 12/26/2019 Medical Record Number: [id number] Patient Account Number: [id number] Date of Birth/Sex: 28-Mar-1966 (54 y.o. M) Treating RN: [name] Primary Care Provider: [name] Other Clinician: Referring Provider: [name] Treating Provider/Extender: [name] in Treatment: 16 Diagnosis Coding ICD-10 Codes Code  Description L89.154 Pressure ulcer of sacral region, stage 4 T81.31XD Disruption of external operation (surgical) wound, not elsewhere classified, subsequent encounter L02.31 Cutaneous abscess of buttock Facility Procedures CPT4 Code: [id number] Description: 99213 - WOUND CARE VISIT-LEV 3 EST PT Modifier: Quantity: 1 Physician Procedures CPT4 Code Description: [id number] 99213 - WC PHYS LEVEL 3 - EST PT Modifier: Quantity: 1 CPT4 Code Description: ICD-10 Diagnosis Description L89.154 Pressure ulcer of sacral region, stage 4 T81.31XD Disruption of external operation (surgical) wound, not elsewhere classi Modifier: fied, subsequent enco Quantity: Personal assistant) Signed: 12/26/2019 4:41:32 PM By: Linton Ham MD Entered By: Linton Ham on 12/26/2019 11:51:40

## 2019-12-27 NOTE — Progress Notes (Signed)
Sargeant, COPELAND RYGG ([id number]) Visit Report for 12/26/2019 Arrival Information Details Patient Name: [name], [name] Date of Service: 12/26/2019 11:00 AM Medical Record Number: [id number] Patient Account Number: [id number] Date of Birth/Sex: 1966-07-20 (54 y.o. M) Treating RN: Montey Hora Primary Care Zanyla Klebba: [name] Other Clinician: Referring Stephane Niemann: [name] Treating Raeshawn Vo/Extender: [name] in Treatment: 16 Visit Information History Since Last Visit Added or deleted any medications: No Patient Arrived: Ambulatory Any new allergies or adverse reactions: No Arrival Time: 11:10 Had a fall or experienced change in No Accompanied By: self activities of daily living that may affect Transfer Assistance: None risk of falls: Patient Identification Verified: Yes Signs or symptoms of abuse/neglect since last visito No Secondary Verification Process Completed: Yes Hospitalized since last visit: No Implantable device outside of the clinic excluding No cellular tissue based products placed in the center since last visit: Has Dressing in Place as Prescribed: Yes Pain Present Now: No Electronic Signature(s) Signed: 12/26/2019 4:33:52 PM By: Montey Hora Entered By: Montey Hora on 12/26/2019 11:10:44 [place], [name] ([id number]) -------------------------------------------------------------------------------- Clinic Level of Care Assessment Details Patient Name: [name], [name] Date of Service: 12/26/2019 11:00 AM Medical Record Number: [id number] Patient Account Number: [id number] Date of Birth/Sex: [birth year] (54 y.o. M) Treating RN: [name] Primary Care Loetta Connelley: [name] Other Clinician: Referring Anmarie Fukushima: [name] Treating Bowdy Bair/Extender: [name] in Treatment: 16 Clinic Level of Care Assessment Items TOOL 4 Quantity Score []  - Use when only an EandM is performed on FOLLOW-UP visit 0 ASSESSMENTS - Nursing Assessment /  Reassessment X - Reassessment of Co-morbidities (includes updates in patient status) 1 10 X- 1 5 Reassessment of Adherence to Treatment Plan ASSESSMENTS - Wound and Skin Assessment / Reassessment X - Simple Wound Assessment / Reassessment - one wound 1 5 []  - 0 Complex Wound Assessment / Reassessment - multiple wounds []  - 0 Dermatologic / Skin Assessment (not related to wound area) ASSESSMENTS - Focused Assessment []  - Circumferential Edema Measurements - multi extremities 0 []  - 0 Nutritional Assessment / Counseling / Intervention []  - 0 Lower Extremity Assessment (monofilament, tuning fork, pulses) []  - 0 Peripheral Arterial Disease Assessment (using hand held doppler) ASSESSMENTS - Ostomy and/or Continence Assessment and Care []  - Incontinence Assessment and Management 0 []  - 0 Ostomy Care Assessment and Management (repouching, etc.) PROCESS - Coordination of Care X - Simple Patient / Family Education for ongoing care 1 15 []  - 0 Complex (extensive) Patient / Family Education for ongoing care []  - 0 Staff obtains Programmer, systems, Records, Test Results / Process Orders []  - 0 Staff telephones HHA, Nursing Homes / Clarify orders / etc []  - 0 Routine Transfer to another Facility (non-emergent condition) []  - 0 Routine Hospital Admission (non-emergent condition) []  - 0 New Admissions / Biomedical engineer / Ordering NPWT, Apligraf, etc. []  - 0 Emergency Hospital Admission (emergent condition) X- 1 10 Simple Discharge Coordination []  - 0 Complex (extensive) Discharge Coordination PROCESS - Special Needs []  - Pediatric / Minor Patient Management 0 []  - 0 Isolation Patient Management []  - 0 Hearing / Language / Visual special needs []  - 0 Assessment of Community assistance (transportation, D/C planning, etc.) []  - 0 Additional assistance / Altered mentation []  - 0 Support Surface(s) Assessment (bed, cushion, seat, etc.) INTERVENTIONS - Wound Cleansing /  Measurement Vondra, Lakyn R. ([id number]) X- 1 5 Simple Wound Cleansing - one wound []  - 0 Complex Wound Cleansing - multiple wounds X- 1 5 Wound Imaging (photographs -  any number of wounds) []  - 0 Wound Tracing (instead of photographs) X- 1 5 Simple Wound Measurement - one wound []  - 0 Complex Wound Measurement - multiple wounds INTERVENTIONS - Wound Dressings []  - Small Wound Dressing one or multiple wounds 0 []  - 0 Medium Wound Dressing one or multiple wounds X- 1 20 Large Wound Dressing one or multiple wounds []  - 0 Application of Medications - topical []  - 0 Application of Medications - injection INTERVENTIONS - Miscellaneous []  - External ear exam 0 []  - 0 Specimen Collection (cultures, biopsies, blood, body fluids, etc.) []  - 0 Specimen(s) / Culture(s) sent or taken to Lab for analysis []  - 0 Patient Transfer (multiple staff / Civil Service fast streamer / Similar devices) []  - 0 Simple Staple / Suture removal (25 or less) []  - 0 Complex Staple / Suture removal (26 or more) []  - 0 Hypo / Hyperglycemic Management (close monitor of Blood Glucose) []  - 0 Ankle / Brachial Index (ABI) - do not check if billed separately X- 1 5 Vital Signs Has the patient been seen at the hospital within the last three years: Yes Total Score: 85 Level Of Care: New/Established - Level 3 Electronic Signature(s) Signed: 12/27/2019 1:40:27 PM By: Gretta Cool, BSN, RN, CWS, Kim RN, BSN Entered By: Gretta Cool, BSN, RN, CWS, Kim on 12/26/2019 11:29:00 Talerico, [name] ([id number]) -------------------------------------------------------------------------------- Encounter Discharge Information Details Patient Name: [name], [name] Date of Service: 12/26/2019 11:00 AM Medical Record Number: [id number] Patient Account Number: [id number] Date of Birth/Sex: 01/10/1966 (54 y.o. M) Treating RN: [name] Primary Care Cherrish Vitali: [name] Other Clinician: Referring Brinlee Gambrell: [name] Treating Enes Wegener/Extender:  [name] in Treatment: 16 Encounter Discharge Information Items Discharge Condition: Stable Ambulatory Status: Ambulatory Discharge Destination: Home Transportation: Private Auto Accompanied By: self Schedule Follow-up Appointment: Yes Clinical Summary of Care: Electronic Signature(s) Signed: 12/27/2019 1:40:27 PM By: Gretta Cool, BSN, RN, CWS, Kim RN, BSN Entered By: Gretta Cool, BSN, RN, CWS, Kim on 12/26/2019 11:29:45 Ines, [name] ([id number]) -------------------------------------------------------------------------------- Lower Extremity Assessment Details Patient Name: [place], [name] Date of Service: 12/26/2019 11:00 AM Medical Record Number: [id number] Patient Account Number: [id number] Date of Birth/Sex: 03/23/1966 (54 y.o. M) Treating RN: Montey Hora Primary Care Najah Liverman: [name] Other Clinician: Referring Cain Fitzhenry: [name] Treating Taylah Dubiel/Extender: Ricard Dillon Weeks in Treatment: 16 Electronic Signature(s) Signed: 12/26/2019 4:33:52 PM By: Montey Hora Entered By: Montey Hora on 12/26/2019 11:10:54 [place], [name] ([id number]) -------------------------------------------------------------------------------- Multi Wound Chart Details Patient Name: Pringle, [name] Date of Service: 12/26/2019 11:00 AM Medical Record Number: [id number] Patient Account Number: [id number] Date of Birth/Sex: 10/21/1965 (54 y.o. M) Treating RN: [name] Primary Care [place] [employer]: [name] Other Clinician: Referring Isra Lindy: [name] Treating Breydan Shillingburg/Extender: [name] in Treatment: 16 Vital Signs Height(in): 72 Pulse(bpm): 83 Weight(lbs): 230 Blood Pressure(mmHg): 114/77 Body Mass Index(BMI): 31 Temperature(F): 97.9 Respiratory Rate(breaths/min): 16 Photos: [N/A:N/A] Wound Location: Midline Sacrum N/A N/A Wounding Event: Gradually Appeared N/A N/A Primary Etiology: Abscess N/A N/A Date Acquired: 07/24/2019 N/A N/A Weeks of  Treatment: 16 N/A N/A Wound Status: Open N/A N/A Measurements L x W x D (cm) 0.8x2x1.2 N/A N/A Area (cm) : 1.257 N/A N/A Volume (cm) : 1.508 N/A N/A % Reduction in Area: 84.80% N/A N/A % Reduction in Volume: 96.00% N/A N/A Position 1 (o'clock): 3 Maximum Distance 1 (cm): 1.1 Tunneling: Yes N/A N/A Classification: Full Thickness With Exposed N/A N/A Support Structures Exudate Amount: Medium N/A N/A Exudate Type: Serosanguineous N/A N/A Exudate Color: red,  brown N/A N/A Foul Odor After Cleansing: Yes N/A N/A Odor Anticipated Due to Product No N/A N/A Use: Wound Margin: Thickened N/A N/A Granulation Amount: Large (67-100%) N/A N/A Granulation Quality: Red, Hyper-granulation N/A N/A Necrotic Amount: Small (1-33%) N/A N/A Exposed Structures: Fat Layer (Subcutaneous Tissue) N/A N/A Exposed: Yes Fascia: No Tendon: No Muscle: No Joint: No Bone: No Epithelialization: None N/A N/A Treatment Notes Wound #1 (Midline Sacrum) Notes Turri, Josephine R. ([id number]) Silver collagen packed lightly into entire open space, secured with bordered foam dressing. Electronic Signature(s) Signed: 12/26/2019 4:41:32 PM By: Linton Ham MD Entered By: Linton Ham on 12/26/2019 11:46:22 Huish, [name] ([id number]) -------------------------------------------------------------------------------- [place] Details Patient Name: Mcdonagh, [name] Date of Service: 12/26/2019 11:00 AM Medical Record Number: [id number] Patient Account Number: [id number] Date of Birth/Sex: 02/08/1966 (54 y.o. M) Treating RN: [name] Primary Care Patterson Hollenbaugh: [name] Other Clinician: Referring Charnay Nazario: [name] Treating Brinna Divelbiss/Extender: [name] in Treatment: 16 Active Inactive Necrotic Tissue Nursing Diagnoses: Impaired tissue integrity related to necrotic/devitalized tissue Goals: Necrotic/devitalized tissue will be minimized in the wound bed Date Initiated:  09/05/2019 Target Resolution Date: 09/26/2019 Goal Status: Active Interventions: Assess patient pain level pre-, during and post procedure and prior to discharge Treatment Activities: Apply topical anesthetic as ordered : 09/05/2019 Notes: Orientation to the Wound Care Program Nursing Diagnoses: Knowledge deficit related to the wound healing center program Goals: Patient/caregiver will verbalize understanding of the [place] Date Initiated: 09/05/2019 Target Resolution Date: 09/26/2019 Goal Status: Active Interventions: Provide education on orientation to the wound center Notes: Pressure Nursing Diagnoses: Knowledge deficit related to management of pressures ulcers Goals: Patient will remain free from development of additional pressure ulcers Date Initiated: 09/05/2019 Target Resolution Date: 10/03/2019 Goal Status: Active Patient/caregiver will verbalize risk factors for pressure ulcer development Date Initiated: 09/05/2019 Target Resolution Date: 09/26/2019 Goal Status: Active Patient/caregiver will verbalize understanding of pressure ulcer management Date Initiated: 09/05/2019 Target Resolution Date: 10/03/2019 Goal Status: Active Interventions: Assess: immobility, friction, shearing, incontinence upon admission and as needed Provide education on pressure ulcers Notes: GARNERSajid, Kneifl ([id number]) Wound/Skin Impairment Nursing Diagnoses: Impaired tissue integrity Goals: Patient/caregiver will verbalize understanding of skin care regimen Date Initiated: 09/05/2019 Target Resolution Date: 09/26/2019 Goal Status: Active Ulcer/skin breakdown will have a volume reduction of 30% by week 4 Date Initiated: 09/05/2019 Target Resolution Date: 10/03/2019 Goal Status: Active Interventions: Assess ulceration(s) every visit Treatment Activities: Referred to DME Denasia Venn for dressing supplies : 09/05/2019 Notes: Electronic Signature(s) Signed: 12/27/2019 1:40:27 PM By:  Gretta Cool, BSN, RN, CWS, Kim RN, BSN Entered By: Gretta Cool, BSN, RN, CWS, Kim on 12/26/2019 11:22:59 [place], [name] ([id number]) -------------------------------------------------------------------------------- Pain Assessment Details Patient Name: Depass, [name] Date of Service: 12/26/2019 11:00 AM Medical Record Number: [id number] Patient Account Number: [id number] Date of Birth/Sex: 03/30/1966 (54 y.o. M) Treating RN: Montey Hora Primary Care Anju Sereno: [name] Other Clinician: Referring Mariana Goytia: [name] Treating Brigida Scotti/Extender: [name] in Treatment: 16 Active Problems Location of Pain Severity and Description of Pain Patient Has Paino No Site Locations Pain Management and Medication Current Pain Management: Electronic Signature(s) Signed: 12/26/2019 4:33:52 PM By: Montey Hora Entered By: Montey Hora on 12/26/2019 11:11:02 Jankowski, [name] ([id number]) -------------------------------------------------------------------------------- Patient/Caregiver Education Details Patient Name: Florance, [name] Date of Service: 12/26/2019 11:00 AM Medical Record Number: [id number] Patient Account Number: [id number] Date of Birth/Gender: 06/02/1966 (54 y.o. M) Treating RN: [name] Primary Care Physician: [name] Other Clinician: Referring Physician: [name] Treating Physician/Extender:  ROBSON, MICHAEL G Weeks in Treatment: 16 Education Assessment Education Provided To: Patient Education Topics Provided Wound/Skin Impairment: Handouts: Caring for Your Ulcer Methods: Demonstration, Explain/Verbal Responses: State content correctly Electronic Signature(s) Signed: 12/27/2019 1:40:27 PM By: Gretta Cool, BSN, RN, CWS, Kim RN, BSN Entered By: Gretta Cool, BSN, RN, CWS, Kim on 12/26/2019 11:29:17 [place], [name] ([id number]) -------------------------------------------------------------------------------- Wound Assessment Details Patient Name: Luepke, [name] Date of  Service: 12/26/2019 11:00 AM Medical Record Number: [id number] Patient Account Number: [id number] Date of Birth/Sex: 10/25/1965 (54 y.o. M) Treating RN: Montey Hora Primary Care Kiandria Clum: [name] Other Clinician: Referring Yutaka Holberg: [name] Treating Munir Victorian/Extender: [name] in Treatment: 16 Wound Status Wound Number: 1 Primary Etiology: Abscess Wound Location: Midline Sacrum Wound Status: Open Wounding Event: Gradually Appeared Date Acquired: 07/24/2019 Weeks Of Treatment: 16 Clustered Wound: No Photos Wound Measurements Length: (cm) 0.8 Width: (cm) 2 Depth: (cm) 1.2 Area: (cm) 1.257 Volume: (cm) 1.508 % Reduction in Area: 84.8% % Reduction in Volume: 96% Epithelialization: None Tunneling: Yes Position (o'clock): 3 Maximum Distance: (cm) 1.1 Undermining: No Wound Description Classification: Full Thickness With Exposed Support Structures Wound Margin: Thickened Exudate Amount: Medium Exudate Type: Serosanguineous Exudate Color: red, brown Foul Odor After Cleansing: Yes Due to Product Use: No Slough/Fibrino No Wound Bed Granulation Amount: Large (67-100%) Exposed Structure Granulation Quality: Red, Hyper-granulation Fascia Exposed: No Necrotic Amount: Small (1-33%) Fat Layer (Subcutaneous Tissue) Exposed: Yes Necrotic Quality: Adherent Slough Tendon Exposed: No Muscle Exposed: No Joint Exposed: No Bone Exposed: No Treatment Notes Wound #1 (Midline Sacrum) Notes Silver collagen packed lightly into entire open space, secured with bordered foam dressing. Rockman, AMILIANO PEIFFER ([id number]) Electronic Signature(s) Signed: 12/26/2019 4:33:52 PM By: Montey Hora Entered By: Montey Hora on 12/26/2019 11:17:59 [place]ford, [name] ([id number]) -------------------------------------------------------------------------------- [place] Details Patient Name: Tollison, [name] Date of Service: 12/26/2019 11:00 AM Medical Record Number: [id number] Patient  Account Number: [id number] Date of Birth/Sex: 09/29/1965 (54 y.o. M) Treating RN: Montey Hora Primary Care Makalyn Lennox: [name] Other Clinician: Referring Teria Khachatryan: [name] Treating Azyriah Nevins/Extender: [name] in Treatment: 16 Vital Signs Time Taken: 11:12 Temperature (F): 97.9 Height (in): 72 Pulse (bpm): 83 Weight (lbs): 230 Respiratory Rate (breaths/min): 16 Body Mass Index (BMI): 31.2 Blood Pressure (mmHg): 114/77 Reference Range: 80 - 120 mg / dl Electronic Signature(s) Signed: 12/26/2019 4:33:52 PM By: Montey Hora Entered By: Montey Hora on 12/26/2019 11:13:55

## 2019-12-28 DIAGNOSIS — M5144 Schmorl's nodes, thoracic region: Secondary | ICD-10-CM | POA: Diagnosis not present

## 2019-12-28 DIAGNOSIS — I081 Rheumatic disorders of both mitral and tricuspid valves: Secondary | ICD-10-CM | POA: Diagnosis not present

## 2019-12-28 DIAGNOSIS — M21372 Foot drop, left foot: Secondary | ICD-10-CM | POA: Diagnosis not present

## 2019-12-28 DIAGNOSIS — M4804 Spinal stenosis, thoracic region: Secondary | ICD-10-CM | POA: Diagnosis not present

## 2019-12-28 DIAGNOSIS — M47816 Spondylosis without myelopathy or radiculopathy, lumbar region: Secondary | ICD-10-CM | POA: Diagnosis not present

## 2019-12-28 DIAGNOSIS — M4316 Spondylolisthesis, lumbar region: Secondary | ICD-10-CM | POA: Diagnosis not present

## 2019-12-28 DIAGNOSIS — N319 Neuromuscular dysfunction of bladder, unspecified: Secondary | ICD-10-CM | POA: Diagnosis not present

## 2019-12-28 DIAGNOSIS — M4646 Discitis, unspecified, lumbar region: Secondary | ICD-10-CM | POA: Diagnosis not present

## 2019-12-28 DIAGNOSIS — F329 Major depressive disorder, single episode, unspecified: Secondary | ICD-10-CM | POA: Diagnosis not present

## 2019-12-28 DIAGNOSIS — L89312 Pressure ulcer of right buttock, stage 2: Secondary | ICD-10-CM | POA: Diagnosis not present

## 2019-12-28 DIAGNOSIS — M40204 Unspecified kyphosis, thoracic region: Secondary | ICD-10-CM | POA: Diagnosis not present

## 2019-12-28 DIAGNOSIS — G4700 Insomnia, unspecified: Secondary | ICD-10-CM | POA: Diagnosis not present

## 2019-12-28 DIAGNOSIS — M4604 Spinal enthesopathy, thoracic region: Secondary | ICD-10-CM | POA: Diagnosis not present

## 2019-12-28 DIAGNOSIS — G834 Cauda equina syndrome: Secondary | ICD-10-CM | POA: Diagnosis not present

## 2019-12-28 DIAGNOSIS — M48061 Spinal stenosis, lumbar region without neurogenic claudication: Secondary | ICD-10-CM | POA: Diagnosis not present

## 2019-12-28 DIAGNOSIS — L89894 Pressure ulcer of other site, stage 4: Secondary | ICD-10-CM | POA: Diagnosis not present

## 2019-12-31 DIAGNOSIS — M48061 Spinal stenosis, lumbar region without neurogenic claudication: Secondary | ICD-10-CM | POA: Diagnosis not present

## 2019-12-31 DIAGNOSIS — M40204 Unspecified kyphosis, thoracic region: Secondary | ICD-10-CM | POA: Diagnosis not present

## 2019-12-31 DIAGNOSIS — M5144 Schmorl's nodes, thoracic region: Secondary | ICD-10-CM | POA: Diagnosis not present

## 2019-12-31 DIAGNOSIS — M4646 Discitis, unspecified, lumbar region: Secondary | ICD-10-CM | POA: Diagnosis not present

## 2019-12-31 DIAGNOSIS — L89894 Pressure ulcer of other site, stage 4: Secondary | ICD-10-CM | POA: Diagnosis not present

## 2019-12-31 DIAGNOSIS — M47816 Spondylosis without myelopathy or radiculopathy, lumbar region: Secondary | ICD-10-CM | POA: Diagnosis not present

## 2019-12-31 DIAGNOSIS — G4700 Insomnia, unspecified: Secondary | ICD-10-CM | POA: Diagnosis not present

## 2019-12-31 DIAGNOSIS — G834 Cauda equina syndrome: Secondary | ICD-10-CM | POA: Diagnosis not present

## 2019-12-31 DIAGNOSIS — I081 Rheumatic disorders of both mitral and tricuspid valves: Secondary | ICD-10-CM | POA: Diagnosis not present

## 2019-12-31 DIAGNOSIS — M4804 Spinal stenosis, thoracic region: Secondary | ICD-10-CM | POA: Diagnosis not present

## 2019-12-31 DIAGNOSIS — M21372 Foot drop, left foot: Secondary | ICD-10-CM | POA: Diagnosis not present

## 2019-12-31 DIAGNOSIS — F329 Major depressive disorder, single episode, unspecified: Secondary | ICD-10-CM | POA: Diagnosis not present

## 2019-12-31 DIAGNOSIS — M4316 Spondylolisthesis, lumbar region: Secondary | ICD-10-CM | POA: Diagnosis not present

## 2019-12-31 DIAGNOSIS — M4604 Spinal enthesopathy, thoracic region: Secondary | ICD-10-CM | POA: Diagnosis not present

## 2019-12-31 DIAGNOSIS — N319 Neuromuscular dysfunction of bladder, unspecified: Secondary | ICD-10-CM | POA: Diagnosis not present

## 2019-12-31 DIAGNOSIS — L89312 Pressure ulcer of right buttock, stage 2: Secondary | ICD-10-CM | POA: Diagnosis not present

## 2020-01-01 ENCOUNTER — Other Ambulatory Visit: Payer: Self-pay | Admitting: Family Medicine

## 2020-01-01 DIAGNOSIS — Z9889 Other specified postprocedural states: Secondary | ICD-10-CM

## 2020-01-01 DIAGNOSIS — L8990 Pressure ulcer of unspecified site, unspecified stage: Secondary | ICD-10-CM

## 2020-01-01 DIAGNOSIS — G834 Cauda equina syndrome: Secondary | ICD-10-CM

## 2020-01-01 MED ORDER — OXYCODONE HCL 10 MG PO TABS: 10.0000 mg | ORAL_TABLET | ORAL | 0 refills | Status: DC | PRN

## 2020-01-01 NOTE — Telephone Encounter (Signed)
Patient called requesting a refill  Oxycodone HCl 10 MG TABS  Patient stated he has 9 tablets left   [facility] [place]

## 2020-01-01 NOTE — Telephone Encounter (Signed)
Rx was last refilled 11/19/19 for #60 for 0 refills. Patient was last seen on 11/19/18 and has no upcoming appts. Ok to refill?

## 2020-01-02 DIAGNOSIS — M4804 Spinal stenosis, thoracic region: Secondary | ICD-10-CM | POA: Diagnosis not present

## 2020-01-02 DIAGNOSIS — M4646 Discitis, unspecified, lumbar region: Secondary | ICD-10-CM | POA: Diagnosis not present

## 2020-01-02 DIAGNOSIS — M4604 Spinal enthesopathy, thoracic region: Secondary | ICD-10-CM | POA: Diagnosis not present

## 2020-01-02 DIAGNOSIS — I081 Rheumatic disorders of both mitral and tricuspid valves: Secondary | ICD-10-CM | POA: Diagnosis not present

## 2020-01-02 DIAGNOSIS — M21372 Foot drop, left foot: Secondary | ICD-10-CM | POA: Diagnosis not present

## 2020-01-02 DIAGNOSIS — M47816 Spondylosis without myelopathy or radiculopathy, lumbar region: Secondary | ICD-10-CM | POA: Diagnosis not present

## 2020-01-02 DIAGNOSIS — G4700 Insomnia, unspecified: Secondary | ICD-10-CM | POA: Diagnosis not present

## 2020-01-02 DIAGNOSIS — L89312 Pressure ulcer of right buttock, stage 2: Secondary | ICD-10-CM | POA: Diagnosis not present

## 2020-01-02 DIAGNOSIS — N319 Neuromuscular dysfunction of bladder, unspecified: Secondary | ICD-10-CM | POA: Diagnosis not present

## 2020-01-02 DIAGNOSIS — F329 Major depressive disorder, single episode, unspecified: Secondary | ICD-10-CM | POA: Diagnosis not present

## 2020-01-02 DIAGNOSIS — M40204 Unspecified kyphosis, thoracic region: Secondary | ICD-10-CM | POA: Diagnosis not present

## 2020-01-02 DIAGNOSIS — M48061 Spinal stenosis, lumbar region without neurogenic claudication: Secondary | ICD-10-CM | POA: Diagnosis not present

## 2020-01-02 DIAGNOSIS — L89304 Pressure ulcer of unspecified buttock, stage 4: Secondary | ICD-10-CM | POA: Diagnosis not present

## 2020-01-02 DIAGNOSIS — M5144 Schmorl's nodes, thoracic region: Secondary | ICD-10-CM | POA: Diagnosis not present

## 2020-01-02 DIAGNOSIS — G834 Cauda equina syndrome: Secondary | ICD-10-CM | POA: Diagnosis not present

## 2020-01-02 DIAGNOSIS — M4316 Spondylolisthesis, lumbar region: Secondary | ICD-10-CM | POA: Diagnosis not present

## 2020-01-02 DIAGNOSIS — L89894 Pressure ulcer of other site, stage 4: Secondary | ICD-10-CM | POA: Diagnosis not present

## 2020-01-04 DIAGNOSIS — N319 Neuromuscular dysfunction of bladder, unspecified: Secondary | ICD-10-CM | POA: Diagnosis not present

## 2020-01-04 DIAGNOSIS — L89894 Pressure ulcer of other site, stage 4: Secondary | ICD-10-CM | POA: Diagnosis not present

## 2020-01-04 DIAGNOSIS — I081 Rheumatic disorders of both mitral and tricuspid valves: Secondary | ICD-10-CM | POA: Diagnosis not present

## 2020-01-04 DIAGNOSIS — M40204 Unspecified kyphosis, thoracic region: Secondary | ICD-10-CM | POA: Diagnosis not present

## 2020-01-04 DIAGNOSIS — G4700 Insomnia, unspecified: Secondary | ICD-10-CM | POA: Diagnosis not present

## 2020-01-04 DIAGNOSIS — M4316 Spondylolisthesis, lumbar region: Secondary | ICD-10-CM | POA: Diagnosis not present

## 2020-01-04 DIAGNOSIS — L89312 Pressure ulcer of right buttock, stage 2: Secondary | ICD-10-CM | POA: Diagnosis not present

## 2020-01-04 DIAGNOSIS — G834 Cauda equina syndrome: Secondary | ICD-10-CM | POA: Diagnosis not present

## 2020-01-04 DIAGNOSIS — M4804 Spinal stenosis, thoracic region: Secondary | ICD-10-CM | POA: Diagnosis not present

## 2020-01-04 DIAGNOSIS — M48061 Spinal stenosis, lumbar region without neurogenic claudication: Secondary | ICD-10-CM | POA: Diagnosis not present

## 2020-01-04 DIAGNOSIS — M5144 Schmorl's nodes, thoracic region: Secondary | ICD-10-CM | POA: Diagnosis not present

## 2020-01-04 DIAGNOSIS — F329 Major depressive disorder, single episode, unspecified: Secondary | ICD-10-CM | POA: Diagnosis not present

## 2020-01-04 DIAGNOSIS — M47816 Spondylosis without myelopathy or radiculopathy, lumbar region: Secondary | ICD-10-CM | POA: Diagnosis not present

## 2020-01-04 DIAGNOSIS — M4604 Spinal enthesopathy, thoracic region: Secondary | ICD-10-CM | POA: Diagnosis not present

## 2020-01-04 DIAGNOSIS — M4646 Discitis, unspecified, lumbar region: Secondary | ICD-10-CM | POA: Diagnosis not present

## 2020-01-04 DIAGNOSIS — M21372 Foot drop, left foot: Secondary | ICD-10-CM | POA: Diagnosis not present

## 2020-01-07 DIAGNOSIS — M21372 Foot drop, left foot: Secondary | ICD-10-CM | POA: Diagnosis not present

## 2020-01-07 DIAGNOSIS — M4604 Spinal enthesopathy, thoracic region: Secondary | ICD-10-CM | POA: Diagnosis not present

## 2020-01-07 DIAGNOSIS — L89894 Pressure ulcer of other site, stage 4: Secondary | ICD-10-CM | POA: Diagnosis not present

## 2020-01-07 DIAGNOSIS — L89312 Pressure ulcer of right buttock, stage 2: Secondary | ICD-10-CM | POA: Diagnosis not present

## 2020-01-07 DIAGNOSIS — I081 Rheumatic disorders of both mitral and tricuspid valves: Secondary | ICD-10-CM | POA: Diagnosis not present

## 2020-01-07 DIAGNOSIS — G834 Cauda equina syndrome: Secondary | ICD-10-CM | POA: Diagnosis not present

## 2020-01-07 DIAGNOSIS — N319 Neuromuscular dysfunction of bladder, unspecified: Secondary | ICD-10-CM | POA: Diagnosis not present

## 2020-01-07 DIAGNOSIS — M40204 Unspecified kyphosis, thoracic region: Secondary | ICD-10-CM | POA: Diagnosis not present

## 2020-01-07 DIAGNOSIS — M4646 Discitis, unspecified, lumbar region: Secondary | ICD-10-CM | POA: Diagnosis not present

## 2020-01-07 DIAGNOSIS — M4316 Spondylolisthesis, lumbar region: Secondary | ICD-10-CM | POA: Diagnosis not present

## 2020-01-07 DIAGNOSIS — M5144 Schmorl's nodes, thoracic region: Secondary | ICD-10-CM | POA: Diagnosis not present

## 2020-01-07 DIAGNOSIS — M4804 Spinal stenosis, thoracic region: Secondary | ICD-10-CM | POA: Diagnosis not present

## 2020-01-07 DIAGNOSIS — F329 Major depressive disorder, single episode, unspecified: Secondary | ICD-10-CM | POA: Diagnosis not present

## 2020-01-07 DIAGNOSIS — G4700 Insomnia, unspecified: Secondary | ICD-10-CM | POA: Diagnosis not present

## 2020-01-07 DIAGNOSIS — M48061 Spinal stenosis, lumbar region without neurogenic claudication: Secondary | ICD-10-CM | POA: Diagnosis not present

## 2020-01-07 DIAGNOSIS — M47816 Spondylosis without myelopathy or radiculopathy, lumbar region: Secondary | ICD-10-CM | POA: Diagnosis not present

## 2020-01-09 ENCOUNTER — Other Ambulatory Visit: Payer: Self-pay

## 2020-01-09 ENCOUNTER — Encounter: Payer: BC Managed Care – PPO | Admitting: Internal Medicine

## 2020-01-09 DIAGNOSIS — Z96642 Presence of left artificial hip joint: Secondary | ICD-10-CM | POA: Diagnosis not present

## 2020-01-09 DIAGNOSIS — M353 Polymyalgia rheumatica: Secondary | ICD-10-CM | POA: Diagnosis not present

## 2020-01-09 DIAGNOSIS — T8131XD Disruption of external operation (surgical) wound, not elsewhere classified, subsequent encounter: Secondary | ICD-10-CM | POA: Diagnosis not present

## 2020-01-09 DIAGNOSIS — L0231 Cutaneous abscess of buttock: Secondary | ICD-10-CM | POA: Diagnosis not present

## 2020-01-09 DIAGNOSIS — T8131XA Disruption of external operation (surgical) wound, not elsewhere classified, initial encounter: Secondary | ICD-10-CM | POA: Diagnosis not present

## 2020-01-09 DIAGNOSIS — X58XXXD Exposure to other specified factors, subsequent encounter: Secondary | ICD-10-CM | POA: Diagnosis not present

## 2020-01-09 DIAGNOSIS — L89154 Pressure ulcer of sacral region, stage 4: Secondary | ICD-10-CM | POA: Diagnosis not present

## 2020-01-09 NOTE — Progress Notes (Signed)
Cuffee, ALBA KRIESEL ([id number]) Visit Report for 01/09/2020 Arrival Information Details Patient Name: [name], [name] Date of Service: 01/09/2020 10:45 AM Medical Record Number: [id number] Patient Account Number: [id number] Date of Birth/Sex: 03-10-1966 (54 y.o. M) Treating RN: Montey Hora Primary Care Mikaeel Petrow: [name] Other Clinician: Referring Dianelly Ferran: [name] Treating Charleston Hankin/Extender: [name] in Treatment: 18 Visit Information History Since Last Visit Added or deleted any medications: No Patient Arrived: Ambulatory Any new allergies or adverse reactions: No Arrival Time: 10:36 Had a fall or experienced change in No Accompanied By: daughter activities of daily living that may affect Transfer Assistance: None risk of falls: Patient Identification Verified: Yes Signs or symptoms of abuse/neglect since last visito No Secondary Verification Process Completed: Yes Hospitalized since last visit: No Implantable device outside of the clinic excluding No cellular tissue based products placed in the center since last visit: Has Dressing in Place as Prescribed: Yes Pain Present Now: No Electronic Signature(s) Signed: 01/09/2020 4:35:33 PM By: Montey Hora Entered By: Montey Hora on 01/09/2020 10:36:33 Albee, [name] ([id number]) -------------------------------------------------------------------------------- Clinic Level of Care Assessment Details Patient Name: Mimbs, [name] Date of Service: 01/09/2020 10:45 AM Medical Record Number: [id number] Patient Account Number: [id number] Date of Birth/Sex: 1965/10/26 (54 y.o. M) Treating RN: [name] Primary Care Lexine Jaspers: [name] Other Clinician: Referring Milta Croson: [name] Treating Jordany Russett/Extender: [name] in Treatment: 18 Clinic Level of Care Assessment Items TOOL 4 Quantity Score []  - Use when only an EandM is performed on FOLLOW-UP visit 0 ASSESSMENTS - Nursing  Assessment / Reassessment X - Reassessment of Co-morbidities (includes updates in patient status) 1 10 X- 1 5 Reassessment of Adherence to Treatment Plan ASSESSMENTS - Wound and Skin Assessment / Reassessment X - Simple Wound Assessment / Reassessment - one wound 1 5 []  - 0 Complex Wound Assessment / Reassessment - multiple wounds []  - 0 Dermatologic / Skin Assessment (not related to wound area) ASSESSMENTS - Focused Assessment []  - Circumferential Edema Measurements - multi extremities 0 []  - 0 Nutritional Assessment / Counseling / Intervention []  - 0 Lower Extremity Assessment (monofilament, tuning fork, pulses) []  - 0 Peripheral Arterial Disease Assessment (using hand held doppler) ASSESSMENTS - Ostomy and/or Continence Assessment and Care []  - Incontinence Assessment and Management 0 []  - 0 Ostomy Care Assessment and Management (repouching, etc.) PROCESS - Coordination of Care X - Simple Patient / Family Education for ongoing care 1 15 []  - 0 Complex (extensive) Patient / Family Education for ongoing care []  - 0 Staff obtains Programmer, systems, Records, Test Results / Process Orders []  - 0 Staff telephones HHA, Nursing Homes / Clarify orders / etc []  - 0 Routine Transfer to another Facility (non-emergent condition) []  - 0 Routine Hospital Admission (non-emergent condition) []  - 0 New Admissions / Biomedical engineer / Ordering NPWT, Apligraf, etc. []  - 0 Emergency Hospital Admission (emergent condition) X- 1 10 Simple Discharge Coordination []  - 0 Complex (extensive) Discharge Coordination PROCESS - Special Needs []  - Pediatric / Minor Patient Management 0 []  - 0 Isolation Patient Management []  - 0 Hearing / Language / Visual special needs []  - 0 Assessment of Community assistance (transportation, D/C planning, etc.) []  - 0 Additional assistance / Altered mentation []  - 0 Support Surface(s) Assessment (bed, cushion, seat, etc.) INTERVENTIONS - Wound Cleansing /  Measurement Sternberg, Masayoshi R. ([id number]) X- 1 5 Simple Wound Cleansing - one wound []  - 0 Complex Wound Cleansing - multiple wounds X- 1 5 Wound Imaging (photographs -  any number of wounds) []  - 0 Wound Tracing (instead of photographs) X- 1 5 Simple Wound Measurement - one wound []  - 0 Complex Wound Measurement - multiple wounds INTERVENTIONS - Wound Dressings []  - Small Wound Dressing one or multiple wounds 0 X- 1 15 Medium Wound Dressing one or multiple wounds []  - 0 Large Wound Dressing one or multiple wounds []  - 0 Application of Medications - topical []  - 0 Application of Medications - injection INTERVENTIONS - Miscellaneous []  - External ear exam 0 []  - 0 Specimen Collection (cultures, biopsies, blood, body fluids, etc.) []  - 0 Specimen(s) / Culture(s) sent or taken to Lab for analysis []  - 0 Patient Transfer (multiple staff / Civil Service fast streamer / Similar devices) []  - 0 Simple Staple / Suture removal (25 or less) []  - 0 Complex Staple / Suture removal (26 or more) []  - 0 Hypo / Hyperglycemic Management (close monitor of Blood Glucose) []  - 0 Ankle / Brachial Index (ABI) - do not check if billed separately X- 1 5 Vital Signs Has the patient been seen at the hospital within the last three years: Yes Total Score: 80 Level Of Care: New/Established - Level 3 Electronic Signature(s) Signed: 01/09/2020 5:04:05 PM By: Gretta Cool, BSN, RN, CWS, Kim RN, BSN Entered By: Gretta Cool, BSN, RN, CWS, Kim on 01/09/2020 10:52:59 Shepard, [name] ([id number]) -------------------------------------------------------------------------------- Encounter Discharge Information Details Patient Name: Messimer, [name] Date of Service: 01/09/2020 10:45 AM Medical Record Number: [id number] Patient Account Number: [id number] Date of Birth/Sex: 07/12/1966 (54 y.o. M) Treating RN: [name] Primary Care Gaurav Baldree: [name] Other Clinician: Referring Marcey Persad: [name] Treating Andriea Hasegawa/Extender:  [name] in Treatment: 18 Encounter Discharge Information Items Discharge Condition: Stable Ambulatory Status: Ambulatory Discharge Destination: Home Transportation: Private Auto Accompanied By: daughter Schedule Follow-up Appointment: Yes Clinical Summary of Care: Electronic Signature(s) Signed: 01/09/2020 5:04:05 PM By: Gretta Cool, BSN, RN, CWS, Kim RN, BSN Entered By: Gretta Cool, BSN, RN, CWS, Kim on 01/09/2020 10:56:26 Dadisman, [name] ([id number]) -------------------------------------------------------------------------------- Lower Extremity Assessment Details Patient Name: Radin, [name] Date of Service: 01/09/2020 10:45 AM Medical Record Number: [id number] Patient Account Number: [id number] Date of Birth/Sex: 06/06/1966 (54 y.o. M) Treating RN: Montey Hora Primary Care Gyselle Matthew: [name] Other Clinician: Referring Nikita Surman: [name] Treating Ankush Gintz/Extender: Ricard Dillon Weeks in Treatment: 18 Electronic Signature(s) Signed: 01/09/2020 4:35:33 PM By: Montey Hora Entered By: Montey Hora on 01/09/2020 10:36:41 Rockhold, [name] ([id number]) -------------------------------------------------------------------------------- Multi Wound Chart Details Patient Name: Garcon, [name] Date of Service: 01/09/2020 10:45 AM Medical Record Number: [id number] Patient Account Number: [id number] Date of Birth/Sex: 09/21/1965 (54 y.o. M) Treating RN: [name] Primary Care Juaquin Ludington: [name] Other Clinician: Referring Carilyn Woolston: [name] Treating Loye Reininger/Extender: [name] in Treatment: 18 Vital Signs Height(in): 72 Pulse(bpm): 76 Weight(lbs): 230 Blood Pressure(mmHg): 111/66 Body Mass Index(BMI): 31 Temperature(F): 97.9 Respiratory Rate(breaths/min): 16 Photos: [N/A:N/A] Wound Location: Midline Sacrum N/A N/A Wounding Event: Gradually Appeared N/A N/A Primary Etiology: Abscess N/A N/A Date Acquired: 07/24/2019 N/A N/A Weeks of  Treatment: 18 N/A N/A Wound Status: Open N/A N/A Measurements L x W x D (cm) 0.5x2x0.8 N/A N/A Area (cm) : 0.785 N/A N/A Volume (cm) : 0.628 N/A N/A % Reduction in Area: 90.50% N/A N/A % Reduction in Volume: 98.30% N/A N/A Classification: Full Thickness With Exposed N/A N/A Support Structures Exudate Amount: Medium N/A N/A Exudate Type: Serosanguineous N/A N/A Exudate Color: red, brown N/A N/A Foul Odor After Cleansing: Yes N/A N/A Odor Anticipated Due  to Product No N/A N/A Use: Wound Margin: Thickened N/A N/A Granulation Amount: Large (67-100%) N/A N/A Granulation Quality: Red, Hyper-granulation N/A N/A Necrotic Amount: None Present (0%) N/A N/A Exposed Structures: Fat Layer (Subcutaneous Tissue) N/A N/A Exposed: Yes Fascia: No Tendon: No Muscle: No Joint: No Bone: No Epithelialization: None N/A N/A Treatment Notes Wound #1 (Midline Sacrum) Notes Silver collagen packed lightly into entire open space, secured with bordered foam dressing. Brosh, MATHEWS STUHR ([id number]) Electronic Signature(s) Signed: 01/09/2020 4:26:25 PM By: Linton Ham MD Entered By: Linton Ham on 01/09/2020 10:56:01 Friese, [name] ([id number]) -------------------------------------------------------------------------------- [place] Details Patient Name: Kingma, VANNIE HOCHSTETLER. Date of Service: 01/09/2020 10:45 AM Medical Record Number: [id number] Patient Account Number: [id number] Date of Birth/Sex: [birth year] (54 y.o. M) Treating RN: [name] Primary Care Blaire Palomino: [name] Other Clinician: Referring Alvis Pulcini: [name] Treating Dangelo Guzzetta/Extender: [name] in Treatment: 18 Active Inactive Necrotic Tissue Nursing Diagnoses: Impaired tissue integrity related to necrotic/devitalized tissue Goals: Necrotic/devitalized tissue will be minimized in the wound bed Date Initiated: 09/05/2019 Target Resolution Date: 09/26/2019 Goal Status:  Active Interventions: Assess patient pain level pre-, during and post procedure and prior to discharge Treatment Activities: Apply topical anesthetic as ordered : 09/05/2019 Notes: Orientation to the Wound Care Program Nursing Diagnoses: Knowledge deficit related to the wound healing center program Goals: Patient/caregiver will verbalize understanding of the [facility] Date Initiated: 09/05/2019 Target Resolution Date: 09/26/2019 Goal Status: Active Interventions: Provide education on orientation to the wound center Notes: Pressure Nursing Diagnoses: Knowledge deficit related to management of pressures ulcers Goals: Patient will remain free from development of additional pressure ulcers Date Initiated: 09/05/2019 Target Resolution Date: 10/03/2019 Goal Status: Active Patient/caregiver will verbalize risk factors for pressure ulcer development Date Initiated: 09/05/2019 Target Resolution Date: 09/26/2019 Goal Status: Active Patient/caregiver will verbalize understanding of pressure ulcer management Date Initiated: 09/05/2019 Target Resolution Date: 10/03/2019 Goal Status: Active Interventions: Assess: immobility, friction, shearing, incontinence upon admission and as needed Provide education on pressure ulcers Notes: GARNERAbrahan, [place] ([id number]) Wound/Skin Impairment Nursing Diagnoses: Impaired tissue integrity Goals: Patient/caregiver will verbalize understanding of skin care regimen Date Initiated: 09/05/2019 Target Resolution Date: 09/26/2019 Goal Status: Active Ulcer/skin breakdown will have a volume reduction of 30% by week 4 Date Initiated: 09/05/2019 Target Resolution Date: 10/03/2019 Goal Status: Active Interventions: Assess ulceration(s) every visit Treatment Activities: Referred to DME Lancelot Alyea for dressing supplies : 09/05/2019 Notes: Electronic Signature(s) Signed: 01/09/2020 5:04:05 PM By: Gretta Cool, BSN, RN, CWS, Kim RN, BSN Entered By: Gretta Cool, BSN, RN,  CWS, Kim on 01/09/2020 10:48:46 Carton, [name] ([id number]) -------------------------------------------------------------------------------- Pain Assessment Details Patient Name: Manka, [name] Date of Service: 01/09/2020 10:45 AM Medical Record Number: [id number] Patient Account Number: [id number] Date of Birth/Sex: 05-13-1966 (54 y.o. M) Treating RN: Montey Hora Primary Care Arly Salminen: [name] Other Clinician: Referring Teigen Bellin: [name] Treating Alonda Weaber/Extender: [name] in Treatment: 18 Active Problems Location of Pain Severity and Description of Pain Patient Has Paino No Site Locations Pain Management and Medication Current Pain Management: Electronic Signature(s) Signed: 01/09/2020 4:35:33 PM By: Montey Hora Entered By: Montey Hora on 01/09/2020 10:40:04 Shovlin, [name] ([id number]) -------------------------------------------------------------------------------- Patient/Caregiver Education Details Patient Name: Majano, [name] Date of Service: 01/09/2020 10:45 AM Medical Record Number: [id number] Patient Account Number: [id number] Date of Birth/Gender: November 03, 1965 (54 y.o. M) Treating RN: [name] Primary Care Physician: [name] Other Clinician: Referring Physician: [name] Treating Physician/Extender: [name] in Treatment: 18 Education Assessment Education Provided To:  Patient Education Topics Provided Wound/Skin Impairment: Handouts: Caring for Your Ulcer Methods: Demonstration, Explain/Verbal Responses: State content correctly Electronic Signature(s) Signed: 01/09/2020 5:04:05 PM By: Gretta Cool, BSN, RN, CWS, Kim RN, BSN Entered By: Gretta Cool, BSN, RN, CWS, Kim on 01/09/2020 10:53:15 Bisping, [name] ([id number]) -------------------------------------------------------------------------------- Wound Assessment Details Patient Name: Pellecchia, [name] Date of Service: 01/09/2020 10:45 AM Medical Record Number:  [id number] Patient Account Number: [id number] Date of Birth/Sex: 09/18/1965 (54 y.o. M) Treating RN: Montey Hora Primary Care Anaissa Macfadden: [name] Other Clinician: Referring Anniebelle Devore: [name] Treating Takyra Cantrall/Extender: [name] in Treatment: 18 Wound Status Wound Number: 1 Primary Etiology: Abscess Wound Location: Midline Sacrum Wound Status: Open Wounding Event: Gradually Appeared Date Acquired: 07/24/2019 Weeks Of Treatment: 18 Clustered Wound: No Photos Wound Measurements Length: (cm) 0.5 Width: (cm) 2 Depth: (cm) 0.8 Area: (cm) 0.785 Volume: (cm) 0.628 % Reduction in Area: 90.5% % Reduction in Volume: 98.3% Epithelialization: None Tunneling: No Undermining: No Wound Description Classification: Full Thickness With Exposed Support Structures Wound Margin: Thickened Exudate Amount: Medium Exudate Type: Serosanguineous Exudate Color: red, brown Foul Odor After Cleansing: Yes Due to Product Use: No Slough/Fibrino No Wound Bed Granulation Amount: Large (67-100%) Exposed Structure Granulation Quality: Red, Hyper-granulation Fascia Exposed: No Necrotic Amount: None Present (0%) Fat Layer (Subcutaneous Tissue) Exposed: Yes Tendon Exposed: No Muscle Exposed: No Joint Exposed: No Bone Exposed: No Treatment Notes Wound #1 (Midline Sacrum) Notes Silver collagen packed lightly into entire open space, secured with bordered foam dressing. Electronic Signature(s) Signed: 01/09/2020 4:35:33 PM By: Montey Hora Lucchesi, [name] ([id number]) Entered By: Montey Hora on 01/09/2020 10:46:25 Maynez, [name] ([id number]) -------------------------------------------------------------------------------- Vitals Details Patient Name: Arata, [name] Date of Service: 01/09/2020 10:45 AM Medical Record Number: [id number] Patient Account Number: [id number] Date of Birth/Sex: 11/20/1965 (54 y.o. M) Treating RN: Montey Hora Primary Care Santanna Whitford: [name] Other Clinician: Referring Kenadi Miltner: [name] Treating Jodene Polyak/Extender: [name] in Treatment: 18 Vital Signs Time Taken: 10:40 Temperature (F): 97.9 Height (in): 72 Pulse (bpm): 76 Weight (lbs): 230 Respiratory Rate (breaths/min): 16 Body Mass Index (BMI): 31.2 Blood Pressure (mmHg): 111/66 Reference Range: 80 - 120 mg / dl Electronic Signature(s) Signed: 01/09/2020 4:35:33 PM By: Montey Hora Entered By: Montey Hora on 01/09/2020 10:41:05

## 2020-01-09 NOTE — Progress Notes (Signed)
Seaman, ANTOIN DARGIS ([id number]) Visit Report for 01/09/2020 HPI Details Patient Name: [name], [name] Date of Service: 01/09/2020 10:45 AM Medical Record Number: [id number] Patient Account Number: [id number] Date of Birth/Sex: 02/23/1966 (54 y.o. M) Treating RN: [name] Primary Care Provider: [name] Other Clinician: Referring Provider: [name] Treating Provider/Extender: [name] in Treatment: 18 History of Present Illness HPI Description: ADMISSION 09/05/2019 This is a 54 year old man who has a complicated history which started with acute low back pain sometime in September. Saw his primary doctor had a CT scan of the abdomen and pelvis that was normal. By late December he was discovered to have a lumbar epidural abscess with cauda equina syndrome. He was admitted to hospital and had a decompressive laminectomy at L3/L4/L5 blood cultures at the time showed MSSA he received IV nafcillin. He went to rehab from 1/5 through 1/15 there he was noted to have an "pressure injury in the skin" although I did not see much more about this in the discharge instructions. Unfortunately this wound became infected after he was discharged home. He required readmission from 1/25 through 2/2 with sepsis, discovered to have a right psoas abscess as well as an infected decubitus ulcer. He had a bedside debridement by general surgery on 1/25 interventional radiology drain the right psoas abscess. General surgery recommended 3 times daily wet- to-dry dressings and an air mattress. He has since been discharged home. He is mobile with a walker. They are using 3 times daily wet-to-dry dressings. According to his family he is eating well. He has a protein supplement but he he is not taking it. He is currently on ampicillin sulbactam as recommended by [name] this is due to be finished currently on 2/22 although there seem to be some suggestion it might go longer. They say he had blood work 2  days ago which I will review. The last lab work I saw was a sedimentation rate of 81 and a C-reactive protein of 212 on 1/25. I did not see any imaging studies of the underlying bone although I will need to review the CT scans that he has had Past medical history, polymyalgia rheumatica, left total hip replacement in 2018 for avascular necrosis, cellulitis of the scrotum in July 2020, history of alcohol abuse but that is not currently problematic 09/12/2019; patient readmitted to the clinic last week. I put him on a wet-to-dry dressing with silver sorb gel to the larger wound area on his coccyx and proximal right buttocks. These are actually connected. In general the wound surfaces look quite good although they are deep and there is a large amount of undermining. There is no exposed bone. Last lab work on 2/8 that was ordered by [name] showed a CRP of 68.5 and a sedimentation rate of greater than 130. The CRP was elevated versus 18.3 on 1/10 although it is difficult to interpret these necessarily in somebody with polymyalgia rheumatica. His white count was 15.7 hemoglobin 10.7 The patient is offloading this is much as he can says that he is eating and drinking well which seems verified by his family member. 2/24; patient with a large wound on the lower sacrum and proximal right buttocks. There is a bridge of overlying skin but the wounds are connected and there is wide undermining. We are using wet-to-dry dressings. I have reviewed [name] notes of 09/17/2019 he is remove the PICC line. He had a prolonged course of IV antibiotics for the MSSA bacteremia, lumbar infection,  psoas abscess and a necrotic sacral wound. Interestingly I noticed the same history of PMR in [place] link that [name] comments on. I went over the case again with the patient and his daughter he is totally unfamiliar with anything to do with polymyalgia rheumatica or its obvious symptoms that I also described. I  told him that this is not a diagnosis that most doctors would make and put in an E HR without documentation but he is just not familiar with it and neither his his daughter. For now we are using wet-to-dry dressings. I think it is time to try a wound VAC now that we are certain about underlying infection issues 3/3; he arrives today with his wound actually looks somewhat better. Two small areas with a bridge of skin actually have contracted I believe there is less depth. There is still extensive undermining. He got his VAC machine yesterday but they have still not put it on. 3/10; the orifice of the actual wound is contracting faster than the undermining dimensions of the wound. This is probably soon going to make this difficult to continue with the [facility] 10/15/2019 upon evaluation today patient appears to be doing well in general with regard to his wound. Apparently this looks good although I have not really seen him he has been seen by [name] up to this point. Nonetheless he is currently on a wound VAC and apparently has been having some odor over the past several days which is why comes in for evaluation earlier today. I am going obtain a wound culture and then subsequently we will likely place the patient on an antibiotic at this point to try to help out with the odor which I presume is coming from a bacterial infection. The patient has been off of all antibiotics for the past month having ended on 22 February. With that being said he is not having any increased pain or anything else going on at this point which is good news. He is not allergic to any antibiotics that he is aware of. 4/7; it has been almost a month since I saw this patient although he was here 2 weeks ago. He had a wound VAC on this wound. I note that he was treated for infection with 2 weeks of Levaquin. We had a report recently from home health that there was an extensive rash apparently in the periwound and they took the [facility]  off for a few days however the rash appears to have cleaned up and the wound VAC was back on on his presentation today. Since I saw this last the year is been considerable reduction in overall wound area 4/21; 2-week follow-up. Still using the wound VAC although I am not completely certain that [facility] is putting this on continuously per discussion with the patient's family. He still has the 2 connecting wounds. The larger area is more midline the smaller area to the right. There is no undermining beyond the smaller wound that I was able to determine. However the larger wound slopes towards that area. We have some improvement in the overall wound measurements still using the wound VAC. 5/5; 2-week follow-up. We held the wound VAC last week largely as the wound and closed down. There is still 2 open areas the more midline area and the area towards the right separated by normal skin. We are using silver alginate 5/19; 2-week follow-up. We discontinued the wound VAC the area has closed down tremendously he now has  2 small open areas separated by normal skin. Not much change using silver alginate from last time. The 2 areas connect but there is not a lot of additional undermining here. No evidence of surrounding infection. The patient has been compliant with offloading 6/2; 2-week follow-up. The patient has 2 small open areas the original connecting wounds this is come in fairly dramatically since the last time he Zaring, MAREK NGHIEM. ([id number]) is here. I can no longer prove a connection undermining between the 2 areas which is a dramatic improvement. We have been using silver collagen. His daughter is changing the dressing 6/16; 2-week follow-up. The patient has 2 open areas the first is his original connecting area under the bridge of skin which I think is actually fully epithelialized. The remaining open area is what is left of the original large wound area. There is still a central slit that  has depth but I do not think any undermining we have been using endoform with a covering silver collagen Electronic Signature(s) Signed: 01/09/2020 4:26:25 PM By: Linton Ham MD Entered By: Linton Ham on 01/09/2020 10:57:13 Gibbons, [name] ([id number]) -------------------------------------------------------------------------------- Physical Exam Details Patient Name: Beutler, Braven R. Date of Service: 01/09/2020 10:45 AM Medical Record Number: [id number] Patient Account Number: [id number] Date of Birth/Sex: 06/11/1966 (54 y.o. M) Treating RN: [name] Primary Care Provider: [name] Other Clinician: Referring Provider: [name] Treating Provider/Extender: [name] in Treatment: 18 Constitutional Sitting or standing Blood Pressure is within target range for patient.. Pulse regular and within target range for patient.Marland Kitchen Respirations regular, non- labored and within target range.. Temperature is normal and within the target range for the patient.Marland Kitchen appears in no distress. Notes Wound exam; there are 2 open areas that are initially seen. 1 is the connecting area under the bridge of skin that has always connected these 2 wound areas. I do not think this has anything open there is a valley underneath this bridge that is totally epithelialized. The only wound that I think is still open is the small slitlike area part of the original large wound. This still has some depth but I cannot prove that there is any undermining. No evidence of infection no purulence Electronic Signature(s) Signed: 01/09/2020 4:26:25 PM By: Linton Ham MD Entered By: Linton Ham on 01/09/2020 11:00:40 Gibeault, [name] ([id number]) -------------------------------------------------------------------------------- Physician Orders Details Patient Name: Petrilla, [name] Date of Service: 01/09/2020 10:45 AM Medical Record Number: [id number] Patient Account Number: [id number] Date of Birth/Sex:  11/13/1965 (54 y.o. M) Treating RN: [name] Primary Care Provider: [name] Other Clinician: Referring Provider: [name] Treating Provider/Extender: [name] in Treatment: 18 Verbal / Phone Orders: No Diagnosis Coding Wound Cleansing Wound #1 Midline Sacrum o Clean wound with Normal Saline. Anesthetic (add to Medication List) Wound #1 Midline Sacrum o Topical Lidocaine 4% cream applied to wound bed prior to debridement (In Clinic Only). Skin Barriers/Peri-Wound Care Wound #1 Midline Sacrum o Skin Prep Primary Wound Dressing Wound #1 Midline Sacrum o Other: - 1 Endoform; moisten with saline (may use collagen if you do not have Endoform) Secondary Dressing Wound #1 Midline Sacrum o Boardered Foam Dressing Dressing Change Frequency Wound #1 Midline Sacrum o Change Dressing Monday, Wednesday, Friday - Homeheath Follow-up Appointments Wound #1 Midline Sacrum o Return Appointment in 2 weeks. Off-Loading Wound #1 Midline Sacrum o Other: - NO Pressure on the wounded areas. Additional Orders / Instructions Wound #1 Midline Sacrum o Increase protein intake. - Powder protein supplement, multivitamin   o Activity as tolerated Home Health Wound #1 Midline [place] Visits - [place] Nurse may visit PRN to address patientos wound care needs. o FACE TO FACE ENCOUNTER: MEDICARE and MEDICAID PATIENTS: I certify that this patient is under my care and that I had a face-to-face encounter that meets the physician face-to-face encounter requirements with this patient on this date. The encounter with the patient was in whole or in part for the following MEDICAL CONDITION: (primary reason for [place]) MEDICAL NECESSITY: I certify, that based on my findings, NURSING services are a medically necessary home health service. HOME BOUND STATUS: I certify that my clinical findings support that this patient is  homebound (i.e., Due to illness or injury, pt requires aid of supportive devices such as crutches, cane, wheelchairs, walkers, the use of special transportation or the assistance of another person to leave their place of residence. There is a normal inability to leave the home and doing so requires considerable and taxing effort. Other absences are for medical reasons / religious services and are infrequent or of short duration when for other reasons). Stonebraker, VANG KRAEGER ([id number]) o If current dressing causes regression in wound condition, may D/C ordered dressing product/s and apply Normal Saline Moist Dressing daily until next [place] / Other MD appointment. [place] of regression in wound condition at [phone]. o Please direct any NON-WOUND related issues/requests for orders to patient's Primary Care Physician Electronic Signature(s) Signed: 01/09/2020 4:26:25 PM By: Linton Ham MD Signed: 01/09/2020 5:04:05 PM By: Gretta Cool, BSN, RN, CWS, Kim RN, BSN Entered By: Gretta Cool, BSN, RN, CWS, Kim on 01/09/2020 10:51:42 Artiaga, [name] ([id number]) -------------------------------------------------------------------------------- Problem List Details Patient Name: Kilpatrick, ZACORY FIOLA. Date of Service: 01/09/2020 10:45 AM Medical Record Number: [id number] Patient Account Number: [id number] Date of Birth/Sex: 09/09/1965 (54 y.o. M) Treating RN: [name] Primary Care Provider: [name] Other Clinician: Referring Provider: [name] Treating Provider/Extender: [name] in Treatment: 18 Active Problems ICD-10 Encounter Code Description Active Date MDM Diagnosis L89.154 Pressure ulcer of sacral region, stage 4 09/05/2019 No Yes T81.31XD Disruption of external operation (surgical) wound, not elsewhere 09/05/2019 No Yes classified, subsequent encounter L02.31 Cutaneous abscess of buttock 09/05/2019 No Yes Inactive Problems Resolved  Problems Electronic Signature(s) Signed: 01/09/2020 4:26:25 PM By: Linton Ham MD Entered By: Linton Ham on 01/09/2020 10:55:52 Mcpartlin, [name] ([id number]) -------------------------------------------------------------------------------- Progress Note Details Patient Name: Longley, [name] Date of Service: 01/09/2020 10:45 AM Medical Record Number: [id number] Patient Account Number: [id number] Date of Birth/Sex: 04/16/1966 (54 y.o. M) Treating RN: [name] Primary Care Provider: [name] Other Clinician: Referring Provider: [name] Treating Provider/Extender: [name] in Treatment: 18 Subjective History of Present Illness (HPI) ADMISSION 09/05/2019 This is a 54 year old man who has a complicated history which started with acute low back pain sometime in September. Saw his primary doctor had a CT scan of the abdomen and pelvis that was normal. By late December he was discovered to have a lumbar epidural abscess with cauda equina syndrome. He was admitted to hospital and had a decompressive laminectomy at L3/L4/L5 blood cultures at the time showed MSSA he received IV nafcillin. He went to rehab from 1/5 through 1/15 there he was noted to have an "pressure injury in the skin" although I did not see much more about this in the discharge instructions. Unfortunately this wound became infected after he was discharged home. He required readmission from 1/25 through  2/2 with sepsis, discovered to have a right psoas abscess as well as an infected decubitus ulcer. He had a bedside debridement by general surgery on 1/25 interventional radiology drain the right psoas abscess. General surgery recommended 3 times daily wet- to-dry dressings and an air mattress. He has since been discharged home. He is mobile with a walker. They are using 3 times daily wet-to-dry dressings. According to his family he is eating well. He has a protein supplement but he he is not taking it. He  is currently on ampicillin sulbactam as recommended by [name] this is due to be finished currently on 2/22 although there seem to be some suggestion it might go longer. They say he had blood work 2 days ago which I will review. The last lab work I saw was a sedimentation rate of 81 and a C-reactive protein of 212 on 1/25. I did not see any imaging studies of the underlying bone although I will need to review the CT scans that he has had Past medical history, polymyalgia rheumatica, left total hip replacement in 2018 for avascular necrosis, cellulitis of the scrotum in July 2020, history of alcohol abuse but that is not currently problematic 09/12/2019; patient readmitted to the clinic last week. I put him on a wet-to-dry dressing with silver sorb gel to the larger wound area on his coccyx and proximal right buttocks. These are actually connected. In general the wound surfaces look quite good although they are deep and there is a large amount of undermining. There is no exposed bone. Last lab work on 2/8 that was ordered by [name] showed a CRP of 68.5 and a sedimentation rate of greater than 130. The CRP was elevated versus 18.3 on 1/10 although it is difficult to interpret these necessarily in somebody with polymyalgia rheumatica. His white count was 15.7 hemoglobin 10.7 The patient is offloading this is much as he can says that he is eating and drinking well which seems verified by his family member. 2/24; patient with a large wound on the lower sacrum and proximal right buttocks. There is a bridge of overlying skin but the wounds are connected and there is wide undermining. We are using wet-to-dry dressings. I have reviewed [name] notes of 09/17/2019 he is remove the PICC line. He had a prolonged course of IV antibiotics for the MSSA bacteremia, lumbar infection, psoas abscess and a necrotic sacral wound. Interestingly I noticed the same history of PMR in [place] link that Dr.  [name] comments on. I went over the case again with the patient and his daughter he is totally unfamiliar with anything to do with polymyalgia rheumatica or its obvious symptoms that I also described. I told him that this is not a diagnosis that most doctors would make and put in an E HR without documentation but he is just not familiar with it and neither his his daughter. For now we are using wet-to-dry dressings. I think it is time to try a wound VAC now that we are certain about underlying infection issues 3/3; he arrives today with his wound actually looks somewhat better. Two small areas with a bridge of skin actually have contracted I believe there is less depth. There is still extensive undermining. He got his VAC machine yesterday but they have still not put it on. 3/10; the orifice of the actual wound is contracting faster than the undermining dimensions of the wound. This is probably soon going to make this difficult to continue  with the [facility] 10/15/2019 upon evaluation today patient appears to be doing well in general with regard to his wound. Apparently this looks good although I have not really seen him he has been seen by [name] up to this point. Nonetheless he is currently on a wound VAC and apparently has been having some odor over the past several days which is why comes in for evaluation earlier today. I am going obtain a wound culture and then subsequently we will likely place the patient on an antibiotic at this point to try to help out with the odor which I presume is coming from a bacterial infection. The patient has been off of all antibiotics for the past month having ended on 22 February. With that being said he is not having any increased pain or anything else going on at this point which is good news. He is not allergic to any antibiotics that he is aware of. 4/7; it has been almost a month since I saw this patient although he was here 2 weeks ago. He had a wound VAC on  this wound. I note that he was treated for infection with 2 weeks of Levaquin. We had a report recently from home health that there was an extensive rash apparently in the periwound and they took the [facility] off for a few days however the rash appears to have cleaned up and the wound VAC was back on on his presentation today. Since I saw this last the year is been considerable reduction in overall wound area 4/21; 2-week follow-up. Still using the wound VAC although I am not completely certain that [place] home health is putting this on continuously per discussion with the patient's family. He still has the 2 connecting wounds. The larger area is more midline the smaller area to the right. There is no undermining beyond the smaller wound that I was able to determine. However the larger wound slopes towards that area. We have some improvement in the overall wound measurements still using the wound VAC. 5/5; 2-week follow-up. We held the wound VAC last week largely as the wound and closed down. There is still 2 open areas the more midline area and the area towards the right separated by normal skin. We are using silver alginate 5/19; 2-week follow-up. We discontinued the wound VAC the area has closed down tremendously he now has 2 small open areas separated by normal skin. Not much change using silver alginate from last time. The 2 areas connect but there is not a lot of additional undermining here. No evidence of surrounding infection. The patient has been compliant with offloading 6/2; 2-week follow-up. The patient has 2 small open areas the original connecting wounds this is come in fairly dramatically since the last time he is here. I can no longer prove a connection undermining between the 2 areas which is a dramatic improvement. We have been using silver collagen. His daughter is changing the dressing 6/16; 2-week follow-up. The patient has 2 open areas the first is his original connecting area under  the bridge of skin which I think is actually fully epithelialized. The remaining open area is what is left of the original large wound area. There is still a central slit that has depth but I do not think Stankey, Ebubechukwu R. ([id number]) any undermining we have been using endoform with a covering silver collagen Objective Constitutional Sitting or standing Blood Pressure is within target range for patient.. Pulse regular and within target range for  patient.Marland Kitchen Respirations regular, non- labored and within target range.. Temperature is normal and within the target range for the patient.Marland Kitchen appears in no distress. Vitals Time Taken: 10:40 AM, Height: 72 in, Weight: 230 lbs, BMI: 31.2, Temperature: 97.9 F, Pulse: 76 bpm, Respiratory Rate: 16 breaths/min, Blood Pressure: 111/66 mmHg. General Notes: Wound exam; there are 2 open areas that are initially seen. 1 is the connecting area under the bridge of skin that has always connected these 2 wound areas. I do not think this has anything open there is a valley underneath this bridge that is totally epithelialized. The only wound that I think is still open is the small slitlike area part of the original large wound. This still has some depth but I cannot prove that there is any undermining. No evidence of infection no purulence Integumentary (Hair, Skin) Wound #1 status is Open. Original cause of wound was Gradually Appeared. The wound is located on the Midline Sacrum. The wound measures 0.5cm length x 2cm width x 0.8cm depth; 0.785cm^2 area and 0.628cm^3 volume. There is Fat Layer (Subcutaneous Tissue) Exposed exposed. There is no tunneling or undermining noted. There is a medium amount of serosanguineous drainage noted. Foul odor after cleansing was noted. The wound margin is thickened. There is large (67-100%) red, hyper - granulation within the wound bed. There is no necrotic tissue within the wound bed. Assessment Active Problems ICD-10 Pressure ulcer  of sacral region, stage 4 Disruption of external operation (surgical) wound, not elsewhere classified, subsequent encounter Cutaneous abscess of buttock Plan Wound Cleansing: Wound #1 Midline Sacrum: Clean wound with Normal Saline. Anesthetic (add to Medication List): Wound #1 Midline Sacrum: Topical Lidocaine 4% cream applied to wound bed prior to debridement (In Clinic Only). Skin Barriers/Peri-Wound Care: Wound #1 Midline Sacrum: Skin Prep Primary Wound Dressing: Wound #1 Midline Sacrum: Other: - 1 Endoform; moisten with saline (may use collagen if you do not have Endoform) Secondary Dressing: Wound #1 Midline Sacrum: Boardered Foam Dressing Dressing Change Frequency: Wound #1 Midline Sacrum: Change Dressing Monday, Wednesday, Friday - Homeheath Follow-up Appointments: Wound #1 Midline Sacrum: Return Appointment in 2 weeks. Off-Loading: Wound #1 Midline Sacrum: [name], Salbador R. ([id number]) Other: - NO Pressure on the wounded areas. Additional Orders / Instructions: Wound #1 Midline Sacrum: Increase protein intake. - Powder protein supplement, multivitamin Activity as tolerated Home Health: Wound #1 Midline Sacrum: Continue Home Health Visits - [place] Nurse may visit PRN to address patient s wound care needs. FACE TO FACE ENCOUNTER: MEDICARE and MEDICAID PATIENTS: I certify that this patient is under my care and that I had a face-to-face encounter that meets the physician face-to-face encounter requirements with this patient on this date. The encounter with the patient was in whole or in part for the following MEDICAL CONDITION: (primary reason for [place]) MEDICAL NECESSITY: I certify, that based on my findings, NURSING services are a medically necessary home health service. HOME BOUND STATUS: I certify that my clinical findings support that this patient is homebound (i.e., Due to illness or injury, pt requires aid of supportive devices such as  crutches, cane, wheelchairs, walkers, the use of special transportation or the assistance of another person to leave their place of residence. There is a normal inability to leave the home and doing so requires considerable and taxing effort. Other absences are for medical reasons / religious services and are infrequent or of short duration when for other reasons). If current dressing causes regression in wound condition, may D/C ordered  dressing product/s and apply Normal Saline Moist Dressing daily until next [place] / Other MD appointment. [place] of regression in wound condition at [phone]. Please direct any NON-WOUND related issues/requests for orders to patient's Primary Care Physician 1. The only open area here that is not epithelialized is a small slitlike area in the middle of the original large wound. This does not have undermining. I will continue with endoform packed into this area with alginate flaring up towards the connected open area. If this does not work I will probably move to [place]. May need to rough up the surfaces of this to get some adhesion. 2. I see no evidence of infection Electronic Signature(s) Signed: 01/09/2020 4:26:25 PM By: Linton Ham MD Entered By: Linton Ham on 01/09/2020 11:04:31 Finger, [name] ([id number]) -------------------------------------------------------------------------------- [place] Details Patient Name: Hagwood, [name] Date of Service: 01/09/2020 Medical Record Number: [id number] Patient Account Number: [id number] Date of Birth/Sex: 07/19/1966 (54 y.o. M) Treating RN: [name] Primary Care Provider: [name] Other Clinician: Referring Provider: [name] Treating Provider/Extender: [name] in Treatment: 18 Diagnosis Coding ICD-10 Codes Code Description L89.154 Pressure ulcer of sacral region, stage 4 T81.31XD Disruption of external operation (surgical) wound, not  elsewhere classified, subsequent encounter L02.31 Cutaneous abscess of buttock Facility Procedures CPT4 Code: 93570177 Description: 99213 - WOUND CARE VISIT-LEV 3 EST PT Modifier: Quantity: 1 Physician Procedures CPT4 Code Description: 9390300 99213 - WC PHYS LEVEL 3 - EST PT Modifier: Quantity: 1 CPT4 Code Description: ICD-10 Diagnosis Description L89.154 Pressure ulcer of sacral region, stage 4 T81.31XD Disruption of external operation (surgical) wound, not elsewhere classi Modifier: fied, subsequent enco Quantity: Personal assistant) Signed: 01/09/2020 4:26:25 PM By: Linton Ham MD Entered By: Linton Ham on 01/09/2020 11:04:49

## 2020-01-11 DIAGNOSIS — M47816 Spondylosis without myelopathy or radiculopathy, lumbar region: Secondary | ICD-10-CM | POA: Diagnosis not present

## 2020-01-11 DIAGNOSIS — F329 Major depressive disorder, single episode, unspecified: Secondary | ICD-10-CM | POA: Diagnosis not present

## 2020-01-11 DIAGNOSIS — I081 Rheumatic disorders of both mitral and tricuspid valves: Secondary | ICD-10-CM | POA: Diagnosis not present

## 2020-01-11 DIAGNOSIS — L89312 Pressure ulcer of right buttock, stage 2: Secondary | ICD-10-CM | POA: Diagnosis not present

## 2020-01-11 DIAGNOSIS — G4700 Insomnia, unspecified: Secondary | ICD-10-CM | POA: Diagnosis not present

## 2020-01-11 DIAGNOSIS — M4604 Spinal enthesopathy, thoracic region: Secondary | ICD-10-CM | POA: Diagnosis not present

## 2020-01-11 DIAGNOSIS — G834 Cauda equina syndrome: Secondary | ICD-10-CM | POA: Diagnosis not present

## 2020-01-11 DIAGNOSIS — M21372 Foot drop, left foot: Secondary | ICD-10-CM | POA: Diagnosis not present

## 2020-01-11 DIAGNOSIS — M4804 Spinal stenosis, thoracic region: Secondary | ICD-10-CM | POA: Diagnosis not present

## 2020-01-11 DIAGNOSIS — M40204 Unspecified kyphosis, thoracic region: Secondary | ICD-10-CM | POA: Diagnosis not present

## 2020-01-11 DIAGNOSIS — M4646 Discitis, unspecified, lumbar region: Secondary | ICD-10-CM | POA: Diagnosis not present

## 2020-01-11 DIAGNOSIS — N319 Neuromuscular dysfunction of bladder, unspecified: Secondary | ICD-10-CM | POA: Diagnosis not present

## 2020-01-11 DIAGNOSIS — M5144 Schmorl's nodes, thoracic region: Secondary | ICD-10-CM | POA: Diagnosis not present

## 2020-01-11 DIAGNOSIS — L89894 Pressure ulcer of other site, stage 4: Secondary | ICD-10-CM | POA: Diagnosis not present

## 2020-01-11 DIAGNOSIS — M4316 Spondylolisthesis, lumbar region: Secondary | ICD-10-CM | POA: Diagnosis not present

## 2020-01-11 DIAGNOSIS — M48061 Spinal stenosis, lumbar region without neurogenic claudication: Secondary | ICD-10-CM | POA: Diagnosis not present

## 2020-01-14 DIAGNOSIS — G834 Cauda equina syndrome: Secondary | ICD-10-CM | POA: Diagnosis not present

## 2020-01-14 DIAGNOSIS — M4316 Spondylolisthesis, lumbar region: Secondary | ICD-10-CM | POA: Diagnosis not present

## 2020-01-14 DIAGNOSIS — M4604 Spinal enthesopathy, thoracic region: Secondary | ICD-10-CM | POA: Diagnosis not present

## 2020-01-14 DIAGNOSIS — F329 Major depressive disorder, single episode, unspecified: Secondary | ICD-10-CM | POA: Diagnosis not present

## 2020-01-14 DIAGNOSIS — M48061 Spinal stenosis, lumbar region without neurogenic claudication: Secondary | ICD-10-CM | POA: Diagnosis not present

## 2020-01-14 DIAGNOSIS — G4700 Insomnia, unspecified: Secondary | ICD-10-CM | POA: Diagnosis not present

## 2020-01-14 DIAGNOSIS — M5144 Schmorl's nodes, thoracic region: Secondary | ICD-10-CM | POA: Diagnosis not present

## 2020-01-14 DIAGNOSIS — M4646 Discitis, unspecified, lumbar region: Secondary | ICD-10-CM | POA: Diagnosis not present

## 2020-01-14 DIAGNOSIS — M40204 Unspecified kyphosis, thoracic region: Secondary | ICD-10-CM | POA: Diagnosis not present

## 2020-01-14 DIAGNOSIS — M21372 Foot drop, left foot: Secondary | ICD-10-CM | POA: Diagnosis not present

## 2020-01-14 DIAGNOSIS — I081 Rheumatic disorders of both mitral and tricuspid valves: Secondary | ICD-10-CM | POA: Diagnosis not present

## 2020-01-14 DIAGNOSIS — L89304 Pressure ulcer of unspecified buttock, stage 4: Secondary | ICD-10-CM | POA: Diagnosis not present

## 2020-01-14 DIAGNOSIS — N319 Neuromuscular dysfunction of bladder, unspecified: Secondary | ICD-10-CM | POA: Diagnosis not present

## 2020-01-14 DIAGNOSIS — L89312 Pressure ulcer of right buttock, stage 2: Secondary | ICD-10-CM | POA: Diagnosis not present

## 2020-01-14 DIAGNOSIS — L89894 Pressure ulcer of other site, stage 4: Secondary | ICD-10-CM | POA: Diagnosis not present

## 2020-01-14 DIAGNOSIS — M4804 Spinal stenosis, thoracic region: Secondary | ICD-10-CM | POA: Diagnosis not present

## 2020-01-14 DIAGNOSIS — M47816 Spondylosis without myelopathy or radiculopathy, lumbar region: Secondary | ICD-10-CM | POA: Diagnosis not present

## 2020-01-15 ENCOUNTER — Other Ambulatory Visit: Payer: Self-pay

## 2020-01-15 ENCOUNTER — Ambulatory Visit (INDEPENDENT_AMBULATORY_CARE_PROVIDER_SITE_OTHER): Payer: BC Managed Care – PPO | Admitting: Orthopaedic Surgery

## 2020-01-15 ENCOUNTER — Ambulatory Visit: Payer: Self-pay

## 2020-01-15 ENCOUNTER — Encounter: Payer: Self-pay | Admitting: Orthopaedic Surgery

## 2020-01-15 VITALS — Ht 72.0 in | Wt 218.0 lb

## 2020-01-15 DIAGNOSIS — M25552 Pain in left hip: Secondary | ICD-10-CM

## 2020-01-15 DIAGNOSIS — Z96642 Presence of left artificial hip joint: Secondary | ICD-10-CM

## 2020-01-15 NOTE — Progress Notes (Signed)
Office Visit Note   Patient: [name]           Date of Birth: 07/06/1966           MRN: [id number] Visit Date: 01/15/2020              Requested by: Lesleigh Noe, MD [place],  [place] [address] PCP: Lesleigh Noe, MD   Assessment & Plan: Visit Diagnoses:  1. Pain in left hip   2. Status post total replacement of left hip     Plan:  #1: At this time our plan is to send him to physical therapy for strengthening of the left hip.  This seems to be an area of weakness.  Follow-Up Instructions: Return in about 3 weeks (around 02/05/2020), or if symptoms worsen or fail to improve.   Face-to-face time spent with patient was greater than 30 minutes.  Greater than 50% of the time was spent in counseling and coordination of care.  Orders:  Orders Placed This Encounter  Procedures  . XR HIP UNILAT W OR W/O PELVIS 2-3 VIEWS LEFT  . Ambulatory referral to Physical Therapy   No orders of the defined types were placed in this encounter.     Procedures: No procedures performed   Clinical Data: No additional findings.   Subjective: Chief Complaint  Patient presents with  . Left Hip - Pain   HPI  Patient presents today for left hip pain. He said that in the last 2-37months he has noticed that if he bends over and lifts his leg to put on socks his left hip will pop and immediately very painful. He said that his hip stays sore. He had his left hip replaced with Dr.Whitfield in 2018. He said that he has lost a lot of weight and feels like that has caused his hip to start popping. He takes Oxycodone as prescribed by his PCP.  He denies any fever shakes or chills.  Denies any weakness.  No lumbar back pain.    Review of Systems  Constitutional: Negative for fatigue.  HENT: Negative for ear pain.   Eyes: Negative for pain.  Respiratory: Negative for shortness of breath.   Cardiovascular: Negative for leg swelling.  Gastrointestinal: Negative for constipation  and diarrhea.  Endocrine: Negative for cold intolerance and heat intolerance.  Genitourinary: Negative for difficulty urinating.  Musculoskeletal: Negative for joint swelling.  Skin: Negative for rash.  Allergic/Immunologic: Negative for food allergies.  Neurological: Positive for weakness.  Hematological: Does not bruise/bleed easily.  Psychiatric/Behavioral: Negative for sleep disturbance.     Objective: Vital Signs: Ht 6' (1.829 m)   Wt 218 lb (98.9 kg)   BMI 29.57 kg/m   Physical Exam Constitutional:      Appearance: Normal appearance. He is well-developed.  HENT:     Head: Normocephalic.  Eyes:     Pupils: Pupils are equal, round, and reactive to light.  Pulmonary:     Effort: Pulmonary effort is normal.  Skin:    General: Skin is warm and dry.  Neurological:     Mental Status: He is alert and oriented to person, place, and time.  Psychiatric:        Behavior: Behavior normal.     Ortho Exam  Exam today reveals a stable total hip replacement at this time.  Examined them on the x-ray table able to get him to about 100 degrees of hip flexion and internally rotate and externally  rotate about 20 to 25 degrees without any type symptoms.  He did try to showers how he feels this subluxation here in the office but it never occurred for me this time.  Specialty Comments:  No specialty comments available.  Imaging: No results found.   PMFS History: Patient Active Problem List   Diagnosis Date Noted  . Adjustment disorder with depressed mood 11/19/2019  . Left hip pain 10/09/2019  . Blurry vision, left eye 10/09/2019  . Infected pressure ulcer 08/20/2019  . Hyponatremia 08/20/2019  . Pressure injury of skin 08/02/2019  . Cauda equina syndrome ([place]) 07/31/2019  . Lumbar discitis 07/30/2019  . 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 S/P lumbar laminectomy 07/25/2019  . H/O MSSA epidural abscess, L2-L5 07/25/2019  . Anemia 07/18/2019  . Obesity (BMI 35.0-39.9  without comorbidity) 07/18/2019  . Constipation 07/18/2019  . Elevated blood sugar 04/01/2017  . Avascular necrosis of bone of hip, left ([place]) 10/12/2016  . Alcohol abuse, in remission 10/12/2016  . S/P total hip arthroplasty 10/12/2016   Past Medical History:  Diagnosis Date  . History of chicken pox   . Medical history non-contributory     Family History  Problem Relation Age of Onset  . Cancer Father        Hodgkin's disease  . COPD Mother   . Heart attack Maternal Grandfather 80  . Diabetes Neg Hx   . Stroke Neg Hx   . Hypertension Neg Hx   . Hyperlipidemia Neg Hx     Past Surgical History:  Procedure Laterality Date  . CARPAL TUNNEL RELEASE Bilateral 2009  . CATARACT EXTRACTION W/PHACO Left 12/17/2019   Procedure: CATARACT EXTRACTION PHACO AND INTRAOCULAR LENS PLACEMENT (IOC) LEFT;  Surgeon: Eulogio Bear, MD;  Location: [facility];  Service: Ophthalmology;  Laterality: Left;  3.61 0:26.9  . COLONOSCOPY W/ POLYPECTOMY    . JOINT REPLACEMENT    . LACERATION REPAIR Right ~ 2012   leg  . LUMBAR LAMINECTOMY/DECOMPRESSION MICRODISCECTOMY N/A 07/24/2019   Procedure: Decompressive Lumbar Laminectomy Lumbar three-four Lumbar four-five for Epidural Abscess;  Surgeon: Kary Kos, MD;  Location: [facility];  Service: Neurosurgery;  Laterality: N/A;  . TOTAL HIP ARTHROPLASTY Right 01/11/2013   Procedure: TOTAL HIP ARTHROPLASTY;  Surgeon: Garald Balding, MD;  Location: [facility];  Service: Orthopedics;  Laterality: Right;  . TOTAL HIP ARTHROPLASTY Left 10/12/2016   Procedure: TOTAL HIP ARTHROPLASTY;  Surgeon: Garald Balding, MD;  Location: [facility];  Service: Orthopedics;  Laterality: Left;   Social History   Occupational History  . 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 Smoking status: Former Smoker    Packs/day: 1.00    Years: 14.00    Pack years: 14.00    Types: Cigarettes    Quit date: 07/27/1999    Years since  quitting: 20.4  . Smokeless tobacco: Never Used  Vaping Use  . Vaping Use: Never used  Substance and Sexual Activity  . Alcohol use: Yes    Alcohol/week: 24.0 standard drinks    Types: 24 Cans of beer per week    Comment: 4-5 beers per day, case of beer per week  . Drug use: No  . Sexual activity: Yes    Birth control/protection: Post-menopausal

## 2020-01-16 ENCOUNTER — Ambulatory Visit (INDEPENDENT_AMBULATORY_CARE_PROVIDER_SITE_OTHER): Payer: BC Managed Care – PPO | Admitting: Psychology

## 2020-01-16 ENCOUNTER — Telehealth: Payer: Self-pay | Admitting: *Deleted

## 2020-01-16 DIAGNOSIS — M21372 Foot drop, left foot: Secondary | ICD-10-CM | POA: Diagnosis not present

## 2020-01-16 DIAGNOSIS — M4646 Discitis, unspecified, lumbar region: Secondary | ICD-10-CM | POA: Diagnosis not present

## 2020-01-16 DIAGNOSIS — M5144 Schmorl's nodes, thoracic region: Secondary | ICD-10-CM | POA: Diagnosis not present

## 2020-01-16 DIAGNOSIS — M4316 Spondylolisthesis, lumbar region: Secondary | ICD-10-CM | POA: Diagnosis not present

## 2020-01-16 DIAGNOSIS — M4604 Spinal enthesopathy, thoracic region: Secondary | ICD-10-CM | POA: Diagnosis not present

## 2020-01-16 DIAGNOSIS — L89894 Pressure ulcer of other site, stage 4: Secondary | ICD-10-CM | POA: Diagnosis not present

## 2020-01-16 DIAGNOSIS — G834 Cauda equina syndrome: Secondary | ICD-10-CM | POA: Diagnosis not present

## 2020-01-16 DIAGNOSIS — L89312 Pressure ulcer of right buttock, stage 2: Secondary | ICD-10-CM | POA: Diagnosis not present

## 2020-01-16 DIAGNOSIS — N319 Neuromuscular dysfunction of bladder, unspecified: Secondary | ICD-10-CM | POA: Diagnosis not present

## 2020-01-16 DIAGNOSIS — M4804 Spinal stenosis, thoracic region: Secondary | ICD-10-CM | POA: Diagnosis not present

## 2020-01-16 DIAGNOSIS — G4700 Insomnia, unspecified: Secondary | ICD-10-CM | POA: Diagnosis not present

## 2020-01-16 DIAGNOSIS — F329 Major depressive disorder, single episode, unspecified: Secondary | ICD-10-CM | POA: Diagnosis not present

## 2020-01-16 DIAGNOSIS — M48061 Spinal stenosis, lumbar region without neurogenic claudication: Secondary | ICD-10-CM | POA: Diagnosis not present

## 2020-01-16 DIAGNOSIS — M40204 Unspecified kyphosis, thoracic region: Secondary | ICD-10-CM | POA: Diagnosis not present

## 2020-01-16 DIAGNOSIS — M47816 Spondylosis without myelopathy or radiculopathy, lumbar region: Secondary | ICD-10-CM | POA: Diagnosis not present

## 2020-01-16 DIAGNOSIS — F4323 Adjustment disorder with mixed anxiety and depressed mood: Secondary | ICD-10-CM | POA: Diagnosis not present

## 2020-01-16 DIAGNOSIS — I081 Rheumatic disorders of both mitral and tricuspid valves: Secondary | ICD-10-CM | POA: Diagnosis not present

## 2020-01-16 NOTE — Telephone Encounter (Signed)
[name] called stating that they faxed orders over on 11/08/19 to be signed and faxed back for a Education officer, museum. [name] stated that they never got the order back and now they are out of compliance with Medicare. [name] stated that she faxed the order over again today and she needs it signed and faxed back to them as soon as possible.Marland Kitchen

## 2020-01-17 NOTE — Telephone Encounter (Signed)
Routing to front office staff to look for the paperwork. I believe I recently signed this.

## 2020-01-17 NOTE — Telephone Encounter (Signed)
Order received this morning and given to Dr.Cody.

## 2020-01-18 DIAGNOSIS — L89894 Pressure ulcer of other site, stage 4: Secondary | ICD-10-CM | POA: Diagnosis not present

## 2020-01-18 DIAGNOSIS — M4604 Spinal enthesopathy, thoracic region: Secondary | ICD-10-CM | POA: Diagnosis not present

## 2020-01-18 DIAGNOSIS — M5144 Schmorl's nodes, thoracic region: Secondary | ICD-10-CM | POA: Diagnosis not present

## 2020-01-18 DIAGNOSIS — M4316 Spondylolisthesis, lumbar region: Secondary | ICD-10-CM | POA: Diagnosis not present

## 2020-01-18 DIAGNOSIS — M40204 Unspecified kyphosis, thoracic region: Secondary | ICD-10-CM | POA: Diagnosis not present

## 2020-01-18 DIAGNOSIS — M4646 Discitis, unspecified, lumbar region: Secondary | ICD-10-CM | POA: Diagnosis not present

## 2020-01-18 DIAGNOSIS — M21372 Foot drop, left foot: Secondary | ICD-10-CM | POA: Diagnosis not present

## 2020-01-18 DIAGNOSIS — N319 Neuromuscular dysfunction of bladder, unspecified: Secondary | ICD-10-CM | POA: Diagnosis not present

## 2020-01-18 DIAGNOSIS — M48061 Spinal stenosis, lumbar region without neurogenic claudication: Secondary | ICD-10-CM | POA: Diagnosis not present

## 2020-01-18 DIAGNOSIS — G834 Cauda equina syndrome: Secondary | ICD-10-CM | POA: Diagnosis not present

## 2020-01-18 DIAGNOSIS — G4700 Insomnia, unspecified: Secondary | ICD-10-CM | POA: Diagnosis not present

## 2020-01-18 DIAGNOSIS — M47816 Spondylosis without myelopathy or radiculopathy, lumbar region: Secondary | ICD-10-CM | POA: Diagnosis not present

## 2020-01-18 DIAGNOSIS — M4804 Spinal stenosis, thoracic region: Secondary | ICD-10-CM | POA: Diagnosis not present

## 2020-01-18 DIAGNOSIS — L89312 Pressure ulcer of right buttock, stage 2: Secondary | ICD-10-CM | POA: Diagnosis not present

## 2020-01-18 DIAGNOSIS — F329 Major depressive disorder, single episode, unspecified: Secondary | ICD-10-CM | POA: Diagnosis not present

## 2020-01-18 DIAGNOSIS — I081 Rheumatic disorders of both mitral and tricuspid valves: Secondary | ICD-10-CM | POA: Diagnosis not present

## 2020-01-21 DIAGNOSIS — N319 Neuromuscular dysfunction of bladder, unspecified: Secondary | ICD-10-CM | POA: Diagnosis not present

## 2020-01-21 DIAGNOSIS — M4804 Spinal stenosis, thoracic region: Secondary | ICD-10-CM | POA: Diagnosis not present

## 2020-01-21 DIAGNOSIS — L89894 Pressure ulcer of other site, stage 4: Secondary | ICD-10-CM | POA: Diagnosis not present

## 2020-01-21 DIAGNOSIS — L89312 Pressure ulcer of right buttock, stage 2: Secondary | ICD-10-CM | POA: Diagnosis not present

## 2020-01-21 DIAGNOSIS — G834 Cauda equina syndrome: Secondary | ICD-10-CM | POA: Diagnosis not present

## 2020-01-21 DIAGNOSIS — M21372 Foot drop, left foot: Secondary | ICD-10-CM | POA: Diagnosis not present

## 2020-01-21 DIAGNOSIS — G4700 Insomnia, unspecified: Secondary | ICD-10-CM | POA: Diagnosis not present

## 2020-01-21 DIAGNOSIS — M48061 Spinal stenosis, lumbar region without neurogenic claudication: Secondary | ICD-10-CM | POA: Diagnosis not present

## 2020-01-21 DIAGNOSIS — M4316 Spondylolisthesis, lumbar region: Secondary | ICD-10-CM | POA: Diagnosis not present

## 2020-01-21 DIAGNOSIS — M47816 Spondylosis without myelopathy or radiculopathy, lumbar region: Secondary | ICD-10-CM | POA: Diagnosis not present

## 2020-01-21 DIAGNOSIS — M40204 Unspecified kyphosis, thoracic region: Secondary | ICD-10-CM | POA: Diagnosis not present

## 2020-01-21 DIAGNOSIS — M4646 Discitis, unspecified, lumbar region: Secondary | ICD-10-CM | POA: Diagnosis not present

## 2020-01-21 DIAGNOSIS — M4604 Spinal enthesopathy, thoracic region: Secondary | ICD-10-CM | POA: Diagnosis not present

## 2020-01-21 DIAGNOSIS — M5144 Schmorl's nodes, thoracic region: Secondary | ICD-10-CM | POA: Diagnosis not present

## 2020-01-21 DIAGNOSIS — F329 Major depressive disorder, single episode, unspecified: Secondary | ICD-10-CM | POA: Diagnosis not present

## 2020-01-21 DIAGNOSIS — I081 Rheumatic disorders of both mitral and tricuspid valves: Secondary | ICD-10-CM | POA: Diagnosis not present

## 2020-01-23 ENCOUNTER — Other Ambulatory Visit: Payer: Self-pay

## 2020-01-23 ENCOUNTER — Encounter: Payer: BC Managed Care – PPO | Admitting: Internal Medicine

## 2020-01-23 DIAGNOSIS — L89154 Pressure ulcer of sacral region, stage 4: Secondary | ICD-10-CM | POA: Diagnosis not present

## 2020-01-23 DIAGNOSIS — Z96642 Presence of left artificial hip joint: Secondary | ICD-10-CM | POA: Diagnosis not present

## 2020-01-23 DIAGNOSIS — X58XXXD Exposure to other specified factors, subsequent encounter: Secondary | ICD-10-CM | POA: Diagnosis not present

## 2020-01-23 DIAGNOSIS — L0231 Cutaneous abscess of buttock: Secondary | ICD-10-CM | POA: Diagnosis not present

## 2020-01-23 DIAGNOSIS — T8131XA Disruption of external operation (surgical) wound, not elsewhere classified, initial encounter: Secondary | ICD-10-CM | POA: Diagnosis not present

## 2020-01-23 DIAGNOSIS — T8131XD Disruption of external operation (surgical) wound, not elsewhere classified, subsequent encounter: Secondary | ICD-10-CM | POA: Diagnosis not present

## 2020-01-23 DIAGNOSIS — M353 Polymyalgia rheumatica: Secondary | ICD-10-CM | POA: Diagnosis not present

## 2020-01-25 DIAGNOSIS — M48061 Spinal stenosis, lumbar region without neurogenic claudication: Secondary | ICD-10-CM | POA: Diagnosis not present

## 2020-01-25 DIAGNOSIS — G4700 Insomnia, unspecified: Secondary | ICD-10-CM | POA: Diagnosis not present

## 2020-01-25 DIAGNOSIS — N319 Neuromuscular dysfunction of bladder, unspecified: Secondary | ICD-10-CM | POA: Diagnosis not present

## 2020-01-25 DIAGNOSIS — M5144 Schmorl's nodes, thoracic region: Secondary | ICD-10-CM | POA: Diagnosis not present

## 2020-01-25 DIAGNOSIS — L89894 Pressure ulcer of other site, stage 4: Secondary | ICD-10-CM | POA: Diagnosis not present

## 2020-01-25 DIAGNOSIS — G834 Cauda equina syndrome: Secondary | ICD-10-CM | POA: Diagnosis not present

## 2020-01-25 DIAGNOSIS — L89312 Pressure ulcer of right buttock, stage 2: Secondary | ICD-10-CM | POA: Diagnosis not present

## 2020-01-25 DIAGNOSIS — M40204 Unspecified kyphosis, thoracic region: Secondary | ICD-10-CM | POA: Diagnosis not present

## 2020-01-25 DIAGNOSIS — M4804 Spinal stenosis, thoracic region: Secondary | ICD-10-CM | POA: Diagnosis not present

## 2020-01-25 DIAGNOSIS — F329 Major depressive disorder, single episode, unspecified: Secondary | ICD-10-CM | POA: Diagnosis not present

## 2020-01-25 DIAGNOSIS — M47816 Spondylosis without myelopathy or radiculopathy, lumbar region: Secondary | ICD-10-CM | POA: Diagnosis not present

## 2020-01-25 DIAGNOSIS — M4604 Spinal enthesopathy, thoracic region: Secondary | ICD-10-CM | POA: Diagnosis not present

## 2020-01-25 DIAGNOSIS — M4316 Spondylolisthesis, lumbar region: Secondary | ICD-10-CM | POA: Diagnosis not present

## 2020-01-25 DIAGNOSIS — M21372 Foot drop, left foot: Secondary | ICD-10-CM | POA: Diagnosis not present

## 2020-01-25 DIAGNOSIS — I081 Rheumatic disorders of both mitral and tricuspid valves: Secondary | ICD-10-CM | POA: Diagnosis not present

## 2020-01-25 DIAGNOSIS — M4646 Discitis, unspecified, lumbar region: Secondary | ICD-10-CM | POA: Diagnosis not present

## 2020-01-28 ENCOUNTER — Other Ambulatory Visit: Payer: Self-pay | Admitting: Family Medicine

## 2020-01-28 DIAGNOSIS — M4804 Spinal stenosis, thoracic region: Secondary | ICD-10-CM | POA: Diagnosis not present

## 2020-01-28 DIAGNOSIS — G4700 Insomnia, unspecified: Secondary | ICD-10-CM | POA: Diagnosis not present

## 2020-01-28 DIAGNOSIS — M47816 Spondylosis without myelopathy or radiculopathy, lumbar region: Secondary | ICD-10-CM | POA: Diagnosis not present

## 2020-01-28 DIAGNOSIS — M5144 Schmorl's nodes, thoracic region: Secondary | ICD-10-CM | POA: Diagnosis not present

## 2020-01-28 DIAGNOSIS — M4646 Discitis, unspecified, lumbar region: Secondary | ICD-10-CM | POA: Diagnosis not present

## 2020-01-28 DIAGNOSIS — L89894 Pressure ulcer of other site, stage 4: Secondary | ICD-10-CM | POA: Diagnosis not present

## 2020-01-28 DIAGNOSIS — M48061 Spinal stenosis, lumbar region without neurogenic claudication: Secondary | ICD-10-CM | POA: Diagnosis not present

## 2020-01-28 DIAGNOSIS — G834 Cauda equina syndrome: Secondary | ICD-10-CM | POA: Diagnosis not present

## 2020-01-28 DIAGNOSIS — M4316 Spondylolisthesis, lumbar region: Secondary | ICD-10-CM | POA: Diagnosis not present

## 2020-01-28 DIAGNOSIS — M4604 Spinal enthesopathy, thoracic region: Secondary | ICD-10-CM | POA: Diagnosis not present

## 2020-01-28 DIAGNOSIS — L89312 Pressure ulcer of right buttock, stage 2: Secondary | ICD-10-CM | POA: Diagnosis not present

## 2020-01-28 DIAGNOSIS — N319 Neuromuscular dysfunction of bladder, unspecified: Secondary | ICD-10-CM | POA: Diagnosis not present

## 2020-01-28 DIAGNOSIS — I081 Rheumatic disorders of both mitral and tricuspid valves: Secondary | ICD-10-CM | POA: Diagnosis not present

## 2020-01-28 DIAGNOSIS — M40204 Unspecified kyphosis, thoracic region: Secondary | ICD-10-CM | POA: Diagnosis not present

## 2020-01-28 DIAGNOSIS — M21372 Foot drop, left foot: Secondary | ICD-10-CM | POA: Diagnosis not present

## 2020-01-28 DIAGNOSIS — F329 Major depressive disorder, single episode, unspecified: Secondary | ICD-10-CM | POA: Diagnosis not present

## 2020-01-29 NOTE — Progress Notes (Signed)
Going, [name] ([id number]) Visit Report for 01/23/2020 Arrival Information Details Patient Name: Stammer, [name] Date of Service: 01/23/2020 11:00 AM Medical Record Number: [id number] Patient Account Number: [id number] Date of Birth/Sex: Jan 16, 1966 (54 y.o. M) Treating RN: [name] Primary Care Raciel Caffrey: [name] Other Clinician: Referring Hakeem Frazzini: [name] Treating Gladys Deckard/Extender: [name] in Treatment: 20 Visit Information History Since Last Visit Added or deleted any medications: No Patient Arrived: Ambulatory Any new allergies or adverse reactions: No Arrival Time: 11:01 Had a fall or experienced change in No Accompanied By: self activities of daily living that may affect Transfer Assistance: None risk of falls: Patient Identification Verified: Yes Signs or symptoms of abuse/neglect since last visito No Secondary Verification Process Completed: Yes Hospitalized since last visit: No Implantable device outside of the clinic excluding No cellular tissue based products placed in the center since last visit: Has Dressing in Place as Prescribed: Yes Has Compression in Place as Prescribed: Yes Pain Present Now: No Electronic Signature(s) Signed: 01/23/2020 11:39:59 AM By: Lorine Bears RCP, RRT, CHT Entered By: Lorine Bears on 01/23/2020 11:02:33 Schremp, [name] ([id number]) -------------------------------------------------------------------------------- Clinic Level of Care Assessment Details Patient Name: Moga, [name] Date of Service: 01/23/2020 11:00 AM Medical Record Number: [id number] Patient Account Number: [id number] Date of Birth/Sex: 12-27-1965 (54 y.o. M) Treating RN: [name] Primary Care Elvenia Godden: [name] Other Clinician: Referring Ruhi Kopke: [name] Treating Ming Kunka/Extender: [name] in Treatment: 20 Clinic Level of Care Assessment Items TOOL 4 Quantity Score []  - Use when  only an EandM is performed on FOLLOW-UP visit 0 ASSESSMENTS - Nursing Assessment / Reassessment X - Reassessment of Co-morbidities (includes updates in patient status) 1 10 X- 1 5 Reassessment of Adherence to Treatment Plan ASSESSMENTS - Wound and Skin Assessment / Reassessment X - Simple Wound Assessment / Reassessment - one wound 1 5 []  - 0 Complex Wound Assessment / Reassessment - multiple wounds []  - 0 Dermatologic / Skin Assessment (not related to wound area) ASSESSMENTS - Focused Assessment []  - Circumferential Edema Measurements - multi extremities 0 []  - 0 Nutritional Assessment / Counseling / Intervention []  - 0 Lower Extremity Assessment (monofilament, tuning fork, pulses) []  - 0 Peripheral Arterial Disease Assessment (using hand held doppler) ASSESSMENTS - Ostomy and/or Continence Assessment and Care []  - Incontinence Assessment and Management 0 []  - 0 Ostomy Care Assessment and Management (repouching, etc.) PROCESS - Coordination of Care X - Simple Patient / Family Education for ongoing care 1 15 []  - 0 Complex (extensive) Patient / Family Education for ongoing care []  - 0 Staff obtains Programmer, systems, Records, Test Results / Process Orders []  - 0 Staff telephones HHA, Nursing Homes / Clarify orders / etc []  - 0 Routine Transfer to another Facility (non-emergent condition) []  - 0 Routine Hospital Admission (non-emergent condition) []  - 0 New Admissions / Biomedical engineer / Ordering NPWT, Apligraf, etc. []  - 0 Emergency Hospital Admission (emergent condition) X- 1 10 Simple Discharge Coordination []  - 0 Complex (extensive) Discharge Coordination PROCESS - Special Needs []  - Pediatric / Minor Patient Management 0 []  - 0 Isolation Patient Management []  - 0 Hearing / Language / Visual special needs []  - 0 Assessment of Community assistance (transportation, D/C planning, etc.) []  - 0 Additional assistance / Altered mentation []  - 0 Support Surface(s)  Assessment (bed, cushion, seat, etc.) INTERVENTIONS - Wound Cleansing / Measurement Veselka, Frazier R. ([id number]) X- 1 5 Simple Wound Cleansing - one wound []  - 0  Complex Wound Cleansing - multiple wounds X- 1 5 Wound Imaging (photographs - any number of wounds) []  - 0 Wound Tracing (instead of photographs) X- 1 5 Simple Wound Measurement - one wound []  - 0 Complex Wound Measurement - multiple wounds INTERVENTIONS - Wound Dressings []  - Small Wound Dressing one or multiple wounds 0 X- 1 15 Medium Wound Dressing one or multiple wounds []  - 0 Large Wound Dressing one or multiple wounds []  - 0 Application of Medications - topical []  - 0 Application of Medications - injection INTERVENTIONS - Miscellaneous []  - External ear exam 0 []  - 0 Specimen Collection (cultures, biopsies, blood, body fluids, etc.) []  - 0 Specimen(s) / Culture(s) sent or taken to Lab for analysis []  - 0 Patient Transfer (multiple staff / Civil Service fast streamer / Similar devices) []  - 0 Simple Staple / Suture removal (25 or less) []  - 0 Complex Staple / Suture removal (26 or more) []  - 0 Hypo / Hyperglycemic Management (close monitor of Blood Glucose) []  - 0 Ankle / Brachial Index (ABI) - do not check if billed separately X- 1 5 Vital Signs Has the patient been seen at the hospital within the last three years: Yes Total Score: 80 Level Of Care: New/Established - Level 3 Electronic Signature(s) Signed: 01/23/2020 2:35:46 PM By: Gretta Cool, BSN, RN, CWS, Kim RN, BSN Entered By: Gretta Cool, BSN, RN, CWS, Kim on 01/23/2020 11:38:03 Okray, [name] ([id number]) -------------------------------------------------------------------------------- Encounter Discharge Information Details Patient Name: Sarkisyan, [name] Date of Service: 01/23/2020 11:00 AM Medical Record Number: [id number] Patient Account Number: [id number] Date of Birth/Sex: 06/19/1966 (54 y.o. M) Treating RN: [name] Primary Care Yaritzi Craun: [name] Other  Clinician: Referring Barclay Lennox: [name] Treating Chales Pelissier/Extender: [name] in Treatment: 20 Encounter Discharge Information Items Discharge Condition: Stable Ambulatory Status: Ambulatory Discharge Destination: Home Transportation: Private Auto Accompanied By: self Schedule Follow-up Appointment: Yes Clinical Summary of Care: Electronic Signature(s) Signed: 01/23/2020 11:39:21 AM By: Gretta Cool, BSN, RN, CWS, Kim RN, BSN Entered By: Gretta Cool, BSN, RN, CWS, Kim on 01/23/2020 11:39:21 Kruzel, [name] ([id number]) -------------------------------------------------------------------------------- Lower Extremity Assessment Details Patient Name: Pouliot, [name] Date of Service: 01/23/2020 11:00 AM Medical Record Number: [id number] Patient Account Number: [id number] Date of Birth/Sex: 04/28/1966 (54 y.o. M) Treating RN: [name] Primary Care Shauna Bodkins: [name] Other Clinician: Referring Savaughn Karwowski: [name] Treating Veida Spira/Extender: [name] in Treatment: 20 Electronic Signature(s) Signed: 01/23/2020 2:35:46 PM By: Gretta Cool, BSN, RN, CWS, Kim RN, BSN Entered By: Gretta Cool, BSN, RN, CWS, Kim on 01/23/2020 11:08:30 Ziomek, [name] ([id number]) -------------------------------------------------------------------------------- Multi Wound Chart Details Patient Name: Garretson, [name] Date of Service: 01/23/2020 11:00 AM Medical Record Number: [id number] Patient Account Number: [id number] Date of Birth/Sex: 05/13/1966 (54 y.o. M) Treating RN: [name] Primary Care Maurine Mowbray: [name] Other Clinician: Referring Kayleena Eke: [name] Treating Raijon Lindfors/Extender: [name] in Treatment: 20 Vital Signs Height(in): 72 Pulse(bpm): 91 Weight(lbs): 230 Blood Pressure(mmHg): 133/68 Body Mass Index(BMI): 31 Temperature(F): 98.2 Respiratory Rate(breaths/min): 16 Photos: [1:No Photos] [N/A:N/A] Wound Location: [1:Midline Sacrum] [N/A:N/A] Wounding  Event: [1:Gradually Appeared] [N/A:N/A] Primary Etiology: [1:Abscess] [N/A:N/A] Date Acquired: [1:07/24/2019] [N/A:N/A] Weeks of Treatment: [1:20] [N/A:N/A] Wound Status: [1:Open] [N/A:N/A] Measurements L x W x D (cm) [1:0.2x0.2x1.4] [N/A:N/A] Area (cm) : [1:0.031] [N/A:N/A] Volume (cm) : [1:0.044] [N/A:N/A] % Reduction in Area: [1:99.60%] [N/A:N/A] % Reduction in Volume: [1:99.90%] [N/A:N/A] Classification: [1:Full Thickness With Exposed Support Structures] [N/A:N/A] Treatment Notes Electronic Signature(s) Signed: 01/29/2020 7:31:01 AM By: Linton Ham MD Previous Signature: 01/23/2020  11:22:27 AM Version By: Gretta Cool, BSN, RN, CWS, Kim RN, BSN Entered By: Linton Ham on 01/23/2020 11:38:15 Saur, [name] ([id number]) -------------------------------------------------------------------------------- [place] Details Patient Name: Reddish, SAHAN PEN. Date of Service: 01/23/2020 11:00 AM Medical Record Number: [id number] Patient Account Number: [id number] Date of Birth/Sex: [birth year] (54 y.o. M) Treating RN: [name] Primary Care Blair Lundeen: [name] Other Clinician: Referring Earnestine Shipp: [name] Treating Enoch Moffa/Extender: [name] in Treatment: 20 Active Inactive Necrotic Tissue Nursing Diagnoses: Impaired tissue integrity related to necrotic/devitalized tissue Goals: Necrotic/devitalized tissue will be minimized in the wound bed Date Initiated: 09/05/2019 Target Resolution Date: 09/26/2019 Goal Status: Active Interventions: Assess patient pain level pre-, during and post procedure and prior to discharge Treatment Activities: Apply topical anesthetic as ordered : 09/05/2019 Notes: Orientation to the Wound Care Program Nursing Diagnoses: Knowledge deficit related to the wound healing center program Goals: Patient/caregiver will verbalize understanding of the [facility] Date Initiated: 09/05/2019 Target Resolution  Date: 09/26/2019 Goal Status: Active Interventions: Provide education on orientation to the wound center Notes: Pressure Nursing Diagnoses: Knowledge deficit related to management of pressures ulcers Goals: Patient will remain free from development of additional pressure ulcers Date Initiated: 09/05/2019 Target Resolution Date: 10/03/2019 Goal Status: Active Patient/caregiver will verbalize risk factors for pressure ulcer development Date Initiated: 09/05/2019 Target Resolution Date: 09/26/2019 Goal Status: Active Patient/caregiver will verbalize understanding of pressure ulcer management Date Initiated: 09/05/2019 Target Resolution Date: 10/03/2019 Goal Status: Active Interventions: Assess: immobility, friction, shearing, incontinence upon admission and as needed Provide education on pressure ulcers Notes: GARNERCaylen, Yardley ([id number]) Wound/Skin Impairment Nursing Diagnoses: Impaired tissue integrity Goals: Patient/caregiver will verbalize understanding of skin care regimen Date Initiated: 09/05/2019 Target Resolution Date: 09/26/2019 Goal Status: Active Ulcer/skin breakdown will have a volume reduction of 30% by week 4 Date Initiated: 09/05/2019 Target Resolution Date: 10/03/2019 Goal Status: Active Interventions: Assess ulceration(s) every visit Treatment Activities: Referred to DME Tyffany Waldrop for dressing supplies : 09/05/2019 Notes: Electronic Signature(s) Signed: 01/23/2020 11:22:19 AM By: Gretta Cool, BSN, RN, CWS, Kim RN, BSN Entered By: Gretta Cool, BSN, RN, CWS, Kim on 01/23/2020 11:22:18 Nickerson, [name] ([id number]) -------------------------------------------------------------------------------- Pain Assessment Details Patient Name: Sutherland, [name] Date of Service: 01/23/2020 11:00 AM Medical Record Number: [id number] Patient Account Number: [id number] Date of Birth/Sex: Jan 05, 1966 (53 y.o. M) Treating RN: [name] Primary Care Takeesha Isley: [name] Other Clinician: Referring  Kellie Chisolm: [name] Treating Thy Gullikson/Extender: [name] in Treatment: 20 Active Problems Location of Pain Severity and Description of Pain Patient Has Paino No Site Locations Pain Management and Medication Current Pain Management: Notes ache not pain Electronic Signature(s) Signed: 01/23/2020 2:35:46 PM By: Gretta Cool, BSN, RN, CWS, Kim RN, BSN Entered By: Gretta Cool, BSN, RN, CWS, Kim on 01/23/2020 11:05:04 Sangha, [name] ([id number]) -------------------------------------------------------------------------------- Patient/Caregiver Education Details Patient Name: Torosian, [name] Date of Service: 01/23/2020 11:00 AM Medical Record Number: [id number] Patient Account Number: [id number] Date of Birth/Gender: Nov 30, 1965 (54 y.o. M) Treating RN: [name] Primary Care Physician: [name] Other Clinician: Referring Physician: [name] Treating Physician/Extender: [name] in Treatment: 20 Education Assessment Education Provided To: Patient Education Topics Provided Pressure: Handouts: Preventing Pressure Ulcers Methods: Demonstration, Explain/Verbal Responses: State content correctly Electronic Signature(s) Signed: 01/23/2020 2:35:46 PM By: Gretta Cool, BSN, RN, CWS, Kim RN, BSN Entered By: Gretta Cool, BSN, RN, CWS, Kim on 01/23/2020 11:38:26 Bambrick, [name] ([id number]) -------------------------------------------------------------------------------- Wound Assessment Details Patient Name: [name], [name] Date of Service: 01/23/2020 11:00 AM Medical Record Number: [id number] Patient Account  Number: [id number] Date of Birth/Sex: 15-Mar-1966 (54 y.o. M) Treating RN: [name] Primary Care Ivelise Castillo: [name] Other Clinician: Referring Levaughn Puccinelli: [name] Treating Kamber Vignola/Extender: [name] in Treatment: 20 Wound Status Wound Number: 1 Primary Etiology: Abscess Wound Location: Midline Sacrum Wound Status: Open Wounding Event:  Gradually Appeared Date Acquired: 07/24/2019 Weeks Of Treatment: 20 Clustered Wound: No Wound Measurements Length: (cm) 0.2 Width: (cm) 0.2 Depth: (cm) 1.4 Area: (cm) 0.031 Volume: (cm) 0.044 % Reduction in Area: 99.6% % Reduction in Volume: 99.9% Wound Description Classification: Full Thickness With Exposed Support Structures Treatment Notes Wound #1 (Midline Sacrum) Notes Iodoflex packed into open space, secured with bordered foam dressing. Electronic Signature(s) Signed: 01/23/2020 2:35:46 PM By: Gretta Cool, BSN, RN, CWS, Kim RN, BSN Entered By: Gretta Cool, BSN, RN, CWS, Kim on 01/23/2020 11:08:22 Stiner, [name] ([id number]) -------------------------------------------------------------------------------- [place] Details Patient Name: [name], [name] Date of Service: 01/23/2020 11:00 AM Medical Record Number: [id number] Patient Account Number: [id number] Date of Birth/Sex: 1966-07-24 (54 y.o. M) Treating RN: [name] Primary Care Shaquille Murdy: [name] Other Clinician: Referring Ysabelle Goodroe: [name] Treating Kehinde Bowdish/Extender: [name] in Treatment: 20 Vital Signs Time Taken: 11:00 Temperature (F): 98.2 Height (in): 72 Pulse (bpm): 91 Weight (lbs): 230 Respiratory Rate (breaths/min): 16 Body Mass Index (BMI): 31.2 Blood Pressure (mmHg): 133/68 Reference Range: 80 - 120 mg / dl Electronic Signature(s) Signed: 01/23/2020 11:39:59 AM By: Lorine Bears RCP, RRT, CHT Entered By: Lorine Bears on 01/23/2020 11:03:09

## 2020-01-29 NOTE — Progress Notes (Signed)
Study, [name] [name] ([id number]) Visit Report for 01/23/2020 HPI Details Patient Name: [name] [name] MANCERA. Date of Service: 01/23/2020 11:00 AM Medical Record Number: [id number] Patient Account Number: [id number] Date of Birth/Sex: [birth year] (54 y.o. M) Treating RN: [name] Primary Care Provider: [name] Other Clinician: Referring Provider: [name] Treating Provider/Extender: [name] in Treatment: 20 History of Present Illness HPI Description: ADMISSION 09/05/2019 This is a 54 year old man who has a complicated history which started with acute low back pain sometime in September. Saw his primary doctor had a CT scan of the abdomen and pelvis that was normal. By late December he was discovered to have a lumbar epidural abscess with cauda equina syndrome. He was admitted to hospital and had a decompressive laminectomy at L3/L4/L5 blood cultures at the time showed MSSA he received IV nafcillin. He went to rehab from 1/5 through 1/15 there he was noted to have an "pressure injury in the skin" although I did not see much more about this in the discharge instructions. Unfortunately this wound became infected after he was discharged home. He required readmission from 1/25 through 2/2 with sepsis, discovered to have a right psoas abscess as well as an infected decubitus ulcer. He had a bedside debridement by general surgery on 1/25 interventional radiology drain the right psoas abscess. General surgery recommended 3 times daily wet- to-dry dressings and an air mattress. He has since been discharged home. He is mobile with a walker. They are using 3 times daily wet-to-dry dressings. According to his family he is eating well. He has a protein supplement but he he is not taking it. He is currently on ampicillin sulbactam as recommended by [name] this is due to be finished currently on 2/22 although there seem to be some suggestion it might go longer. They say he had blood work 2  days ago which I will review. The last lab work I saw was a sedimentation rate of 81 and a C-reactive protein of 212 on 1/25. I did not see any imaging studies of the underlying bone although I will need to review the CT scans that he has had Past medical history, polymyalgia rheumatica, left total hip replacement in 2018 for avascular necrosis, cellulitis of the scrotum in July 2020, history of alcohol abuse but that is not currently problematic 09/12/2019; patient readmitted to the clinic last week. I put him on a wet-to-dry dressing with silver sorb gel to the larger wound area on his coccyx and proximal right buttocks. These are actually connected. In general the wound surfaces look quite good although they are deep and there is a large amount of undermining. There is no exposed bone. Last lab work on 2/8 that was ordered by [name] showed a CRP of 68.5 and a sedimentation rate of greater than 130. The CRP was elevated versus 18.3 on 1/10 although it is difficult to interpret these necessarily in somebody with polymyalgia rheumatica. His white count was 15.7 hemoglobin 10.7 The patient is offloading this is much as he can says that he is eating and drinking well which seems verified by his family member. 2/24; patient with a large wound on the lower sacrum and proximal right buttocks. There is a bridge of overlying skin but the wounds are connected and there is wide undermining. We are using wet-to-dry dressings. I have reviewed [name] notes of 09/17/2019 he is remove the PICC line. He had a prolonged course of IV antibiotics for the MSSA bacteremia, lumbar infection,  psoas abscess and a necrotic sacral wound. Interestingly I noticed the same history of PMR in [name] [name] that [name] comments on. I went over the case again with the patient and his daughter he is totally unfamiliar with anything to do with polymyalgia rheumatica or its obvious symptoms that I also described. I  told him that this is not a diagnosis that most doctors would make and put in an E HR without documentation but he is just not familiar with it and neither his his daughter. For now we are using wet-to-dry dressings. I think it is time to try a wound VAC now that we are certain about underlying infection issues 3/3; he arrives today with his wound actually looks somewhat better. Two small areas with a bridge of skin actually have contracted I believe there is less depth. There is still extensive undermining. He got his VAC machine yesterday but they have still not put it on. 3/10; the orifice of the actual wound is contracting faster than the undermining dimensions of the wound. This is probably soon going to make this difficult to continue with the [facility] 10/15/2019 upon evaluation today patient appears to be doing well in general with regard to his wound. Apparently this looks good although I have not really seen him he has been seen by [name] up to this point. Nonetheless he is currently on a wound VAC and apparently has been having some odor over the past several days which is why comes in for evaluation earlier today. I am going obtain a wound culture and then subsequently we will likely place the patient on an antibiotic at this point to try to help out with the odor which I presume is coming from a bacterial infection. The patient has been off of all antibiotics for the past month having ended on 22 February. With that being said he is not having any increased pain or anything else going on at this point which is good news. He is not allergic to any antibiotics that he is aware of. 4/7; it has been almost a month since I saw this patient although he was here 2 weeks ago. He had a wound VAC on this wound. I note that he was treated for infection with 2 weeks of Levaquin. We had a report recently from home health that there was an extensive rash apparently in the periwound and they took the [facility]  off for a few days however the rash appears to have cleaned up and the wound VAC was back on on his presentation today. Since I saw this last the year is been considerable reduction in overall wound area 4/21; 2-week follow-up. Still using the wound VAC although I am not completely certain that [place] home health is putting this on continuously per discussion with the patient's family. He still has the 2 connecting wounds. The larger area is more midline the smaller area to the right. There is no undermining beyond the smaller wound that I was able to determine. However the larger wound slopes towards that area. We have some improvement in the overall wound measurements still using the wound VAC. 5/5; 2-week follow-up. We held the wound VAC last week largely as the wound and closed down. There is still 2 open areas the more midline area and the area towards the right separated by normal skin. We are using silver alginate 5/19; 2-week follow-up. We discontinued the wound VAC the area has closed down tremendously he now has  2 small open areas separated by normal skin. Not much change using silver alginate from last time. The 2 areas connect but there is not a lot of additional undermining here. No evidence of surrounding infection. The patient has been compliant with offloading 6/2; 2-week follow-up. The patient has 2 small open areas the original connecting wounds this is come in fairly dramatically since the last time he [name] [name] SANTUCCI. ([id number]) is here. I can no longer prove a connection undermining between the 2 areas which is a dramatic improvement. We have been using silver collagen. His daughter is changing the dressing 6/16; 2-week follow-up. The patient has 2 open areas the first is his original connecting area under the bridge of skin which I think is actually fully epithelialized. The remaining open area is what is left of the original large wound area. There is still a central slit that  has depth but I do not think any undermining we have been using endoform with a covering silver collagen 6/30; 2-week follow-up. He has 1 very small wound orifice but with over a centimeter of depth which is in the remaining large wound. The divot laterally is still closed this was a secondary wound there is no open area here. We have been using endoform however this orifice will not allow any similar dressing. Electronic Signature(s) Signed: 01/29/2020 7:31:01 AM By: Linton Ham MD Entered By: Linton Ham on 01/23/2020 11:39:11 [name] [name] Celeste ([id number]) -------------------------------------------------------------------------------- Physical Exam Details Patient Name: Whinery, [name] Celeste. Date of Service: 01/23/2020 11:00 AM Medical Record Number: [id number] Patient Account Number: [id number] Date of Birth/Sex: 10/19/1965 (54 y.o. M) Treating RN: [name] Primary Care Provider: [name] Other Clinician: Referring Provider: [name] Treating Provider/Extender: [name] in Treatment: 20 Constitutional Sitting or standing Blood Pressure is within target range for patient.. Pulse regular and within target range for patient.Marland Kitchen Respirations regular, non- labored and within target range.. Temperature is normal and within the target range for the patient.Marland Kitchen appears in no distress. Integumentary (Hair, Skin) No erythema around the wound. Notes Wound exam; the only remaining area is over the lower sacrum. This is a very small wound orifice however using a skinny there is about 1.2 cm of depth. No purulence no surrounding erythema and no soft tissue crepitus. This is too small for any form of standard dressing even with packing strips. oThe area just to the right of this was a secondary wound. This is a divot but it has no opening everything looks epithelialized here. oNo purulent drainage in either area Electronic Signature(s) Signed: 01/29/2020 7:31:01 AM By: Linton Ham MD Entered By: Linton Ham on 01/23/2020 11:41:53 [name] [name] Celeste ([id number]) -------------------------------------------------------------------------------- Physician Orders Details Patient Name: [name] [name] Celeste. Date of Service: 01/23/2020 11:00 AM Medical Record Number: [id number] Patient Account Number: [id number] Date of Birth/Sex: 05/30/1966 (54 y.o. M) Treating RN: [name] Primary Care Provider: [name] Other Clinician: Referring Provider: [name] Treating Provider/Extender: [name] in Treatment: 20 Verbal / Phone Orders: No Diagnosis Coding Wound Cleansing Wound #1 Midline Sacrum o Clean wound with Normal Saline. Primary Wound Dressing Wound #1 Midline Sacrum o Iodoflex Secondary Dressing Wound #1 Midline Sacrum o Boardered Foam Dressing Dressing Change Frequency Wound #1 Midline Sacrum o Change dressing every other day. Follow-up Appointments Wound #1 Midline Sacrum o Return Appointment in 3 weeks. Off-Loading Wound #1 Midline Sacrum o Other: - NO Pressure on the wounded areas. Additional Orders / Instructions Wound #1 Midline Sacrum o  Increase protein intake. - Powder protein supplement, multivitamin o Activity as tolerated Home Health Wound #1 Midline Sacrum o D/C Home Health Services Electronic Signature(s) Signed: 01/23/2020 2:35:46 PM By: Gretta Cool, BSN, RN, CWS, Kim RN, BSN Signed: 01/29/2020 7:31:01 AM By: Linton Ham MD Previous Signature: 01/23/2020 11:34:59 AM Version By: Gretta Cool, BSN, RN, CWS, Kim RN, BSN Entered By: Gretta Cool, BSN, RN, CWS, Kim on 01/23/2020 11:37:22 Seever, [name] Celeste ([id number]) -------------------------------------------------------------------------------- Problem List Details Patient Name: [name] [name] COATE. Date of Service: 01/23/2020 11:00 AM Medical Record Number: [id number] Patient Account Number: [id number] Date of Birth/Sex: 1966/04/15 (54 y.o. M) Treating RN: [name] Primary Care Provider: [name] Other Clinician: Referring Provider: [name] Treating Provider/Extender: [name] in Treatment: 20 Active Problems ICD-10 Encounter Code Description Active Date MDM Diagnosis L89.154 Pressure ulcer of sacral region, stage 4 09/05/2019 No Yes T81.31XD Disruption of external operation (surgical) wound, not elsewhere 09/05/2019 No Yes classified, subsequent encounter L02.31 Cutaneous abscess of buttock 09/05/2019 No Yes Inactive Problems Resolved Problems Electronic Signature(s) Signed: 01/29/2020 7:31:01 AM By: Linton Ham MD Entered By: Linton Ham on 01/23/2020 11:38:06 [name] [name] Celeste ([id number]) -------------------------------------------------------------------------------- Progress Note Details Patient Name: [name] [name] Celeste. Date of Service: 01/23/2020 11:00 AM Medical Record Number: [id number] Patient Account Number: [id number] Date of Birth/Sex: April 07, 1966 (54 y.o. M) Treating RN: [name] Primary Care Provider: [name] Other Clinician: Referring Provider: [name] Treating Provider/Extender: [name] in Treatment: 20 Subjective History of Present Illness (HPI) ADMISSION 09/05/2019 This is a 54 year old man who has a complicated history which started with acute low back pain sometime in September. Saw his primary doctor had a CT scan of the abdomen and pelvis that was normal. By late December he was discovered to have a lumbar epidural abscess with cauda equina syndrome. He was admitted to hospital and had a decompressive laminectomy at L3/L4/L5 blood cultures at the time showed MSSA he received IV nafcillin. He went to rehab from 1/5 through 1/15 there he was noted to have an "pressure injury in the skin" although I did not see much more about this in the discharge instructions. Unfortunately this wound became infected after he was discharged home. He required readmission from 1/25  through 2/2 with sepsis, discovered to have a right psoas abscess as well as an infected decubitus ulcer. He had a bedside debridement by general surgery on 1/25 interventional radiology drain the right psoas abscess. General surgery recommended 3 times daily wet- to-dry dressings and an air mattress. He has since been discharged home. He is mobile with a walker. They are using 3 times daily wet-to-dry dressings. According to his family he is eating well. He has a protein supplement but he he is not taking it. He is currently on ampicillin sulbactam as recommended by [name] this is due to be finished currently on 2/22 although there seem to be some suggestion it might go longer. They say he had blood work 2 days ago which I will review. The last lab work I saw was a sedimentation rate of 81 and a C-reactive protein of 212 on 1/25. I did not see any imaging studies of the underlying bone although I will need to review the CT scans that he has had Past medical history, polymyalgia rheumatica, left total hip replacement in 2018 for avascular necrosis, cellulitis of the scrotum in July 2020, history of alcohol abuse but that is not currently problematic 09/12/2019; patient readmitted to the clinic last week. I put  him on a wet-to-dry dressing with silver sorb gel to the larger wound area on his coccyx and proximal right buttocks. These are actually connected. In general the wound surfaces look quite good although they are deep and there is a large amount of undermining. There is no exposed bone. Last lab work on 2/8 that was ordered by [name] showed a CRP of 68.5 and a sedimentation rate of greater than 130. The CRP was elevated versus 18.3 on 1/10 although it is difficult to interpret these necessarily in somebody with polymyalgia rheumatica. His white count was 15.7 hemoglobin 10.7 The patient is offloading this is much as he can says that he is eating and drinking well which seems verified by  his family member. 2/24; patient with a large wound on the lower sacrum and proximal right buttocks. There is a bridge of overlying skin but the wounds are connected and there is wide undermining. We are using wet-to-dry dressings. I have reviewed [name] notes of 09/17/2019 he is remove the PICC line. He had a prolonged course of IV antibiotics for the MSSA bacteremia, lumbar infection, psoas abscess and a necrotic sacral wound. Interestingly I noticed the same history of PMR in Troy [name] that [name] comments on. I went over the case again with the patient and his daughter he is totally unfamiliar with anything to do with polymyalgia rheumatica or its obvious symptoms that I also described. I told him that this is not a diagnosis that most doctors would make and put in an E HR without documentation but he is just not familiar with it and neither his his daughter. For now we are using wet-to-dry dressings. I think it is time to try a wound VAC now that we are certain about underlying infection issues 3/3; he arrives today with his wound actually looks somewhat better. Two small areas with a bridge of skin actually have contracted I believe there is less depth. There is still extensive undermining. He got his VAC machine yesterday but they have still not put it on. 3/10; the orifice of the actual wound is contracting faster than the undermining dimensions of the wound. This is probably soon going to make this difficult to continue with the [facility] 10/15/2019 upon evaluation today patient appears to be doing well in general with regard to his wound. Apparently this looks good although I have not really seen him he has been seen by [name] up to this point. Nonetheless he is currently on a wound VAC and apparently has been having some odor over the past several days which is why comes in for evaluation earlier today. I am going obtain a wound culture and then subsequently we will likely  place the patient on an antibiotic at this point to try to help out with the odor which I presume is coming from a bacterial infection. The patient has been off of all antibiotics for the past month having ended on 22 February. With that being said he is not having any increased pain or anything else going on at this point which is good news. He is not allergic to any antibiotics that he is aware of. 4/7; it has been almost a month since I saw this patient although he was here 2 weeks ago. He had a wound VAC on this wound. I note that he was treated for infection with 2 weeks of Levaquin. We had a report recently from home health that there was an extensive rash  apparently in the periwound and they took the [name] off for a few days however the rash appears to have cleaned up and the wound VAC was back on on his presentation today. Since I saw this last the year is been considerable reduction in overall wound area 4/21; 2-week follow-up. Still using the wound VAC although I am not completely certain that [place] home health is putting this on continuously per discussion with the patient's family. He still has the 2 connecting wounds. The larger area is more midline the smaller area to the right. There is no undermining beyond the smaller wound that I was able to determine. However the larger wound slopes towards that area. We have some improvement in the overall wound measurements still using the wound VAC. 5/5; 2-week follow-up. We held the wound VAC last week largely as the wound and closed down. There is still 2 open areas the more midline area and the area towards the right separated by normal skin. We are using silver alginate 5/19; 2-week follow-up. We discontinued the wound VAC the area has closed down tremendously he now has 2 small open areas separated by normal skin. Not much change using silver alginate from last time. The 2 areas connect but there is not a lot of additional undermining here.  No evidence of surrounding infection. The patient has been compliant with offloading 6/2; 2-week follow-up. The patient has 2 small open areas the original connecting wounds this is come in fairly dramatically since the last time he is here. I can no longer prove a connection undermining between the 2 areas which is a dramatic improvement. We have been using silver collagen. His daughter is changing the dressing 6/16; 2-week follow-up. The patient has 2 open areas the first is his original connecting area under the bridge of skin which I think is actually fully epithelialized. The remaining open area is what is left of the original large wound area. There is still a central slit that has depth but I do not think [name] [name] R. ([id number]) any undermining we have been using endoform with a covering silver collagen 6/30; 2-week follow-up. He has 1 very small wound orifice but with over a centimeter of depth which is in the remaining large wound. The divot laterally is still closed this was a secondary wound there is no open area here. We have been using endoform however this orifice will not allow any similar dressing. Objective Constitutional Sitting or standing Blood Pressure is within target range for patient.. Pulse regular and within target range for patient.Marland Kitchen Respirations regular, non- labored and within target range.. Temperature is normal and within the target range for the patient.Marland Kitchen appears in no distress. Vitals Time Taken: 11:00 AM, Height: 72 in, Weight: 230 lbs, BMI: 31.2, Temperature: 98.2 F, Pulse: 91 bpm, Respiratory Rate: 16 breaths/min, Blood Pressure: 133/68 mmHg. General Notes: Wound exam; the only remaining area is over the lower sacrum. This is a very small wound orifice however using a skinny there is about 1.2 cm of depth. No purulence no surrounding erythema and no soft tissue crepitus. This is too small for any form of standard dressing even with packing strips. The  area just to the right of this was a secondary wound. This is a divot but it has no opening everything looks epithelialized here. No purulent drainage in either area Integumentary (Hair, Skin) No erythema around the wound. Wound #1 status is Open. Original cause of wound was Gradually Appeared. The wound is located  on the Midline Sacrum. The wound measures 0.2cm length x 0.2cm width x 1.4cm depth; 0.031cm^2 area and 0.044cm^3 volume. Assessment Active Problems ICD-10 Pressure ulcer of sacral region, stage 4 Disruption of external operation (surgical) wound, not elsewhere classified, subsequent encounter Cutaneous abscess of buttock Plan Wound Cleansing: Wound #1 Midline Sacrum: Clean wound with Normal Saline. Primary Wound Dressing: Wound #1 Midline Sacrum: Iodoflex Secondary Dressing: Wound #1 Midline Sacrum: Boardered Foam Dressing Dressing Change Frequency: Wound #1 Midline Sacrum: Change dressing every other day. Follow-up Appointments: Wound #1 Midline Sacrum: Return Appointment in 3 weeks. Off-Loading: Wound #1 Midline Sacrum: Other: - NO Pressure on the wounded areas. Additional Orders / Instructions: Wound #1 Midline Sacrum: Increase protein intake. - Powder protein supplement, multivitamin [name] [name] R. ([id number]) Activity as tolerated Home Health: Wound #1 Midline Sacrum: D/C Home Health Services #1 we change the primary dressing to Iodosorb ointment/Iodoflex. Hopefully to get some tissue adherence and close the small remaining open area. The problem is the depth which is over 1 cm. 2. There is no evidence of surrounding infection there is nothing to culture here 3. The divot in the right buttock which was his secondary wound on presentation is totally closed. On careful inspection there is epithelialization everywhere here Electronic Signature(s) Signed: 01/29/2020 7:31:01 AM By: Linton Ham MD Entered By: Linton Ham on 01/23/2020 11:47:58 La[place]amme,  [name] Celeste ([id number]) -------------------------------------------------------------------------------- SuperBill Details Patient Name: Neubert, [name] Celeste. Date of Service: 01/23/2020 Medical Record Number: [id number] Patient Account Number: [id number] Date of Birth/Sex: 12/11/1965 (54 y.o. M) Treating RN: [name] Primary Care Provider: [name] Other Clinician: Referring Provider: [name] Treating Provider/Extender: [name] in Treatment: 20 Diagnosis Coding ICD-10 Codes Code Description L89.154 Pressure ulcer of sacral region, stage 4 T81.31XD Disruption of external operation (surgical) wound, not elsewhere classified, subsequent encounter L02.31 Cutaneous abscess of buttock Facility Procedures CPT4 Code: 16606004 Description: 99213 - WOUND CARE VISIT-LEV 3 EST PT Modifier: Quantity: 1 Physician Procedures CPT4 Code Description: [id number] 99213 - WC PHYS LEVEL 3 - EST PT Modifier: Quantity: 1 CPT4 Code Description: ICD-10 Diagnosis Description L89.154 Pressure ulcer of sacral region, stage 4 T81.31XD Disruption of external operation (surgical) wound, not elsewhere classi L02.31 Cutaneous abscess of buttock Modifier: fied, subsequent enco Quantity: Personal assistant) Signed: 01/29/2020 7:31:01 AM By: Linton Ham MD Entered By: Linton Ham on 01/23/2020 11:48:20

## 2020-01-30 ENCOUNTER — Ambulatory Visit: Payer: BC Managed Care – PPO | Admitting: Physical Therapy

## 2020-01-30 DIAGNOSIS — M21372 Foot drop, left foot: Secondary | ICD-10-CM | POA: Diagnosis not present

## 2020-01-30 DIAGNOSIS — M4646 Discitis, unspecified, lumbar region: Secondary | ICD-10-CM | POA: Diagnosis not present

## 2020-01-30 DIAGNOSIS — G834 Cauda equina syndrome: Secondary | ICD-10-CM | POA: Diagnosis not present

## 2020-01-30 DIAGNOSIS — M4604 Spinal enthesopathy, thoracic region: Secondary | ICD-10-CM | POA: Diagnosis not present

## 2020-01-30 DIAGNOSIS — I081 Rheumatic disorders of both mitral and tricuspid valves: Secondary | ICD-10-CM | POA: Diagnosis not present

## 2020-01-30 DIAGNOSIS — M48061 Spinal stenosis, lumbar region without neurogenic claudication: Secondary | ICD-10-CM | POA: Diagnosis not present

## 2020-01-30 DIAGNOSIS — G4700 Insomnia, unspecified: Secondary | ICD-10-CM | POA: Diagnosis not present

## 2020-01-30 DIAGNOSIS — L89312 Pressure ulcer of right buttock, stage 2: Secondary | ICD-10-CM | POA: Diagnosis not present

## 2020-01-30 DIAGNOSIS — M4804 Spinal stenosis, thoracic region: Secondary | ICD-10-CM | POA: Diagnosis not present

## 2020-01-30 DIAGNOSIS — M4316 Spondylolisthesis, lumbar region: Secondary | ICD-10-CM | POA: Diagnosis not present

## 2020-01-30 DIAGNOSIS — N319 Neuromuscular dysfunction of bladder, unspecified: Secondary | ICD-10-CM | POA: Diagnosis not present

## 2020-01-30 DIAGNOSIS — L89894 Pressure ulcer of other site, stage 4: Secondary | ICD-10-CM | POA: Diagnosis not present

## 2020-01-30 DIAGNOSIS — M40204 Unspecified kyphosis, thoracic region: Secondary | ICD-10-CM | POA: Diagnosis not present

## 2020-01-30 DIAGNOSIS — M5144 Schmorl's nodes, thoracic region: Secondary | ICD-10-CM | POA: Diagnosis not present

## 2020-01-30 DIAGNOSIS — F329 Major depressive disorder, single episode, unspecified: Secondary | ICD-10-CM | POA: Diagnosis not present

## 2020-01-30 DIAGNOSIS — M47816 Spondylosis without myelopathy or radiculopathy, lumbar region: Secondary | ICD-10-CM | POA: Diagnosis not present

## 2020-02-04 ENCOUNTER — Telehealth: Payer: Self-pay | Admitting: Family Medicine

## 2020-02-04 NOTE — Telephone Encounter (Signed)
Patient called in stating he is needing a refill on his pain medication, as he does not have enough for the rest of the week. Patient also inquiring about a medication for depression. Please advise.

## 2020-02-04 NOTE — Telephone Encounter (Signed)
Would recommend office visit.   If he is able to come in before he is out of pain medication can prescribe at office visit. If schedules does not permit can send in refill, but would recommend visit to discuss depression medication

## 2020-02-04 NOTE — Telephone Encounter (Signed)
Spoke to pt and scheduled him for an appt tomorrow 02/05/20 at noon.

## 2020-02-05 ENCOUNTER — Encounter: Payer: Self-pay | Admitting: Family Medicine

## 2020-02-05 ENCOUNTER — Ambulatory Visit (INDEPENDENT_AMBULATORY_CARE_PROVIDER_SITE_OTHER): Payer: BC Managed Care – PPO | Admitting: Family Medicine

## 2020-02-05 ENCOUNTER — Other Ambulatory Visit: Payer: Self-pay

## 2020-02-05 VITALS — BP 110/70 | HR 90 | Temp 98.3°F | Wt 235.5 lb

## 2020-02-05 DIAGNOSIS — F4321 Adjustment disorder with depressed mood: Secondary | ICD-10-CM

## 2020-02-05 DIAGNOSIS — Z9889 Other specified postprocedural states: Secondary | ICD-10-CM

## 2020-02-05 DIAGNOSIS — L8990 Pressure ulcer of unspecified site, unspecified stage: Secondary | ICD-10-CM | POA: Diagnosis not present

## 2020-02-05 MED ORDER — OXYCODONE HCL 10 MG PO TABS: 10.0000 mg | ORAL_TABLET | ORAL | 0 refills | Status: DC | PRN

## 2020-02-05 MED ORDER — OXYCODONE HCL 10 MG PO TABS: 10.0000 mg | ORAL_TABLET | Freq: Two times a day (BID) | ORAL | 0 refills | Status: DC | PRN

## 2020-02-05 MED ORDER — ESCITALOPRAM OXALATE 10 MG PO TABS: 10.0000 mg | ORAL_TABLET | Freq: Every day | ORAL | 1 refills | Status: DC

## 2020-02-05 NOTE — Assessment & Plan Note (Signed)
At this point he is close to 6 months from initial injury but has had depression symptoms for ~4-5 months. Therapy was ineffective and he is interested in medication to help. Discussed side effects and risks. Will try lexapro or prozac if lexapro is too expensive.

## 2020-02-05 NOTE — Assessment & Plan Note (Signed)
Continues to follow with wound and told it is healing. Treatment is incredibly painful and cannot sit for long periods of time due to pain. Oxycodone prn to help with pain and for treatment

## 2020-02-05 NOTE — Progress Notes (Signed)
Subjective:     [name] is a 54 y.o. male presenting for Medication Refill (oxycodone) and Discuss depression (not happy with psychiatrist )     HPI   #Pain - healing - told everything is improving - seeing wound every 2 weeks  - has to avoid sitting on his buttock  - significant pain when he sees the wound doctor, improves if he takes oxycodone - was seeing [facility] PT - but once he could walk that stopped - was suppose to start therapy last Wednesday, but had a funeral  - still having significant leg/thigh pain - saw Dr. Durward Fortes - taking oxycodone depending on what is going on -- for example washed truck and had bandage - mowed the yard and then did nothing for 2 days   #depression - wants to be back at therapist - would rather talk to a friend then a stranger  -   Review of Systems   Social History   Tobacco Use  Smoking Status Former Smoker  . Packs/day: 1.00  . Years: 14.00  . Pack years: 14.00  . Types: Cigarettes  . Quit date: 07/27/1999  . Years since quitting: 20.5  Smokeless Tobacco Never Used        Objective:    BP Readings from Last 3 Encounters:  02/05/20 110/70  12/17/19 102/77  11/14/19 111/69   Wt Readings from Last 3 Encounters:  02/05/20 235 lb 8 oz (106.8 kg)  01/15/20 218 lb (98.9 kg)  12/17/19 222 lb (100.7 kg)    BP 110/70   Pulse 90   Temp 98.3 F (36.8 C) (Temporal)   Wt 235 lb 8 oz (106.8 kg)   SpO2 100%   BMI 31.94 kg/m    Physical Exam Constitutional:      Appearance: Normal appearance. He is not ill-appearing or diaphoretic.  HENT:     Right Ear: External ear normal.     Left Ear: External ear normal.  Eyes:     General: No scleral icterus.    Extraocular Movements: Extraocular movements intact.     Conjunctiva/sclera: Conjunctivae normal.  Cardiovascular:     Rate and Rhythm: Normal rate.  Pulmonary:     Effort: Pulmonary effort is normal.  Musculoskeletal:     Cervical back: Neck supple.      Comments: Sitting sideway in chair, then has to stand half way through visit due to pain.   Neurological:     Mental Status: He is alert. Mental status is at baseline.  Psychiatric:        Mood and Affect: Mood is depressed. Affect is tearful.      Depression screen [facility] 2/9 02/05/2020 11/19/2019 11/14/2019  Decreased Interest 2 3 0  Down, Depressed, Hopeless 2 2 1   PHQ - 2 Score 4 5 1   Altered sleeping 3 3 -  Tired, decreased energy 3 0 -  Change in appetite 2 0 -  Feeling bad or failure about yourself  3 0 -  Trouble concentrating 0 0 -  Moving slowly or fidgety/restless 0 3 -  Suicidal thoughts 0 0 -  PHQ-9 Score 15 11 -  Difficult doing work/chores Somewhat difficult Somewhat difficult -        Assessment & Plan:   Problem List Items Addressed This Visit      Nervous and Auditory   RESOLVED: Cauda equina syndrome (HCC)   Relevant Medications   Oxycodone HCl 10 MG TABS   escitalopram (LEXAPRO) 10 MG  tablet   Oxycodone HCl 10 MG TABS (Start on 04/07/2020)   Oxycodone HCl 10 MG TABS (Start on 03/07/2020)     Musculoskeletal and Integument   Pressure injury of skin    Continues to follow with wound and told it is healing. Treatment is incredibly painful and cannot sit for long periods of time due to pain. Oxycodone prn to help with pain and for treatment      Relevant Medications   Oxycodone HCl 10 MG TABS   Oxycodone HCl 10 MG TABS (Start on 04/07/2020)   Oxycodone HCl 10 MG TABS (Start on 03/07/2020)     Other   S/P lumbar laminectomy    Pt with persistent back pain following infection. He cannot stand or sit for long periods of time. 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 HH PT ended when he was able to ambulate well, but would benefit from PT for continued strengthening and recovery. Discussed continuing to limit oxycodone to severe pain and contract today. Anticipate with PT and  resolution of his wound his use will eventually decrease.       Relevant Medications   Oxycodone HCl 10 MG TABS   Oxycodone HCl 10 MG TABS (Start on 04/07/2020)   Oxycodone HCl 10 MG TABS (Start on 03/07/2020)   Adjustment disorder with depressed mood - Primary    At this point he is close to 6 months from initial injury but has had depression symptoms for ~4-5 months. Therapy was ineffective and he is interested in medication to help. Discussed side effects and risks. Will try lexapro or prozac if lexapro is too expensive.       Relevant Medications   escitalopram (LEXAPRO) 10 MG tablet       Return in about 6 weeks (around 03/18/2020).  Lesleigh Noe, MD  This visit occurred during the SARS-CoV-2 public health emergency.  Safety protocols were in place, including screening questions prior to the visit, additional usage of staff PPE, and extensive cleaning of exam room while observing appropriate contact time as indicated for disinfecting solutions.

## 2020-02-05 NOTE — Patient Instructions (Signed)
You are going to start a new antidepressant medication.   One of the risks of this medication is increase in suicidal thoughts.   Your suicide Action plan is as follows:  1) Call your daughter or wife 2) Call the Suicide Hotline [phone] which is available 24 hours 3) Call the Clinic    The most common side effect is stomach upset. If this happens it means the medication is working. It should get better in 1-3 weeks.   Medication for depression and anxiety often takes 6-8 weeks to have a noticeable difference so stick with it. Also the best way for recovery is taking medication and seeing a therapist -- this is so important.

## 2020-02-05 NOTE — Addendum Note (Signed)
Addended by: [name] R on: 02/05/2020 01:59 PM   Modules accepted: Orders

## 2020-02-05 NOTE — Assessment & Plan Note (Signed)
Pt with persistent back pain following infection. He cannot stand or sit for long periods of time. 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 HH PT ended when he was able to ambulate well, but would benefit from PT for continued strengthening and recovery. Discussed continuing to limit oxycodone to severe pain and contract today. Anticipate with PT and resolution of his wound his use will eventually decrease.

## 2020-02-06 ENCOUNTER — Encounter: Payer: BC Managed Care – PPO | Attending: Internal Medicine | Admitting: Internal Medicine

## 2020-02-06 DIAGNOSIS — X58XXXA Exposure to other specified factors, initial encounter: Secondary | ICD-10-CM | POA: Diagnosis not present

## 2020-02-06 DIAGNOSIS — Z96642 Presence of left artificial hip joint: Secondary | ICD-10-CM | POA: Insufficient documentation

## 2020-02-06 DIAGNOSIS — I10 Essential (primary) hypertension: Secondary | ICD-10-CM | POA: Insufficient documentation

## 2020-02-06 DIAGNOSIS — T8131XA Disruption of external operation (surgical) wound, not elsewhere classified, initial encounter: Secondary | ICD-10-CM | POA: Diagnosis not present

## 2020-02-06 DIAGNOSIS — L89154 Pressure ulcer of sacral region, stage 4: Secondary | ICD-10-CM | POA: Insufficient documentation

## 2020-02-06 DIAGNOSIS — M353 Polymyalgia rheumatica: Secondary | ICD-10-CM | POA: Insufficient documentation

## 2020-02-06 DIAGNOSIS — L0231 Cutaneous abscess of buttock: Secondary | ICD-10-CM | POA: Diagnosis not present

## 2020-02-06 DIAGNOSIS — T8131XD Disruption of external operation (surgical) wound, not elsewhere classified, subsequent encounter: Secondary | ICD-10-CM | POA: Insufficient documentation

## 2020-02-06 NOTE — Progress Notes (Signed)
Burnette, ANTWIAN SANTAANA ([id number]) Visit Report for 02/06/2020 HPI Details Patient Name: [name], [name] Date of Service: 02/06/2020 10:45 AM Medical Record Number: [id number] Patient Account Number: [id number] Date of Birth/Sex: May 11, 1966 (54 y.o. M) Treating RN: [name] Primary Care Provider: [name] Other Clinician: Referring Provider: [name] Treating Provider/Extender: [name] in Treatment: 22 History of Present Illness HPI Description: ADMISSION 09/05/2019 This is a 54 year old man who has a complicated history which started with acute low back pain sometime in September. Saw his primary doctor had a CT scan of the abdomen [name] pelvis that was normal. By late December he was discovered to have a lumbar epidural abscess with cauda equina syndrome. He was admitted to hospital [name] had a decompressive laminectomy at L3/L4/L5 blood cultures at the time showed MSSA he received IV nafcillin. He went to rehab from 1/5 through 1/15 there he was noted to have an "pressure injury in the skin" although I did not see much more about this in the discharge instructions. Unfortunately this wound became infected after he was discharged home. He required readmission from 1/25 through 2/2 with sepsis, discovered to have a right psoas abscess as well as an infected decubitus ulcer. He had a bedside debridement by general surgery on 1/25 interventional radiology drain the right psoas abscess. General surgery recommended 3 times daily wet- to-dry dressings [name] an air mattress. He has since been discharged home. He is mobile with a walker. They are using 3 times daily wet-to-dry dressings. According to his family he is eating well. He has a protein supplement but he he is not taking it. He is currently on ampicillin sulbactam as recommended by [name] this is due to be finished currently on 2/22 although there seem to be some suggestion it might go longer. They say he had blood work 2  days ago which I will review. The last lab work I saw was a sedimentation rate of 81 [name] a C-reactive protein of 212 on 1/25. I did not see any imaging studies of the underlying bone although I will need to review the CT scans that he has had Past medical history, polymyalgia rheumatica, left total hip replacement in 2018 for avascular necrosis, cellulitis of the scrotum in July 2020, history of alcohol abuse but that is not currently problematic 09/12/2019; patient readmitted to the clinic last week. I put him on a wet-to-dry dressing with silver sorb gel to the larger wound area on his coccyx [name] proximal right buttocks. These are actually connected. In general the wound surfaces look quite good although they are deep [name] there is a large amount of undermining. There is no exposed bone. Last lab work on 2/8 that was ordered by [name] showed a CRP of 68.5 [name] a sedimentation rate of greater than 130. The CRP was elevated versus 18.3 on 1/10 although it is difficult to interpret these necessarily in somebody with polymyalgia rheumatica. His white count was 15.7 hemoglobin 10.7 The patient is offloading this is much as he can says that he is eating [name] drinking well which seems verified by his family member. 2/24; patient with a large wound on the lower sacrum [name] proximal right buttocks. There is a bridge of overlying skin but the wounds are connected [name] there is wide undermining. We are using wet-to-dry dressings. I have reviewed [name] notes of 09/17/2019 he is remove the PICC line. He had a prolonged course of IV antibiotics for the MSSA bacteremia, lumbar infection,  psoas abscess [name] a necrotic sacral wound. Interestingly I noticed the same history of PMR in [place] link that [name] comments on. I went over the case again with the patient [name] his daughter he is totally unfamiliar with anything to do with polymyalgia rheumatica or its obvious symptoms that I also described. I  told him that this is not a diagnosis that most doctors would make [name] put in an E HR without documentation but he is just not familiar with it [name] neither his his daughter. For now we are using wet-to-dry dressings. I think it is time to try a wound VAC now that we are certain about underlying infection issues 3/3; he arrives today with his wound actually looks somewhat better. Two small areas with a bridge of skin actually have contracted I believe there is less depth. There is still extensive undermining. He got his VAC machine yesterday but they have still not put it on. 3/10; the orifice of the actual wound is contracting faster than the undermining dimensions of the wound. This is probably soon going to make this difficult to continue with the [facility] 10/15/2019 upon evaluation today patient appears to be doing well in general with regard to his wound. Apparently this looks good although I have not really seen him he has been seen by [name] up to this point. Nonetheless he is currently on a wound VAC [name] apparently has been having some odor over the past several days which is why comes in for evaluation earlier today. I am going obtain a wound culture [name] then subsequently we will likely place the patient on an antibiotic at this point to try to help out with the odor which I presume is coming from a bacterial infection. The patient has been off of all antibiotics for the past month having ended on 22 February. With that being said he is not having any increased pain or anything else going on at this point which is good news. He is not allergic to any antibiotics that he is aware of. 4/7; it has been almost a month since I saw this patient although he was here 2 weeks ago. He had a wound VAC on this wound. I note that he was treated for infection with 2 weeks of Levaquin. We had a report recently from home health that there was an extensive rash apparently in the periwound [name] they took the [facility]  off for a few days however the rash appears to have cleaned up [name] the wound VAC was back on on his presentation today. Since I saw this last the year is been considerable reduction in overall wound area 4/21; 2-week follow-up. Still using the wound VAC although I am not completely certain that [place] home health is putting this on continuously per discussion with the patient's family. He still has the 2 connecting wounds. The larger area is more midline the smaller area to the right. There is no undermining beyond the smaller wound that I was able to determine. However the larger wound slopes towards that area. We have some improvement in the overall wound measurements still using the wound VAC. 5/5; 2-week follow-up. We held the wound VAC last week largely as the wound [name] closed down. There is still 2 open areas the more midline area [name] the area towards the right separated by normal skin. We are using silver alginate 5/19; 2-week follow-up. We discontinued the wound VAC the area has closed down tremendously he now has  2 small open areas separated by normal skin. Not much change using silver alginate from last time. The 2 areas connect but there is not a lot of additional undermining here. No evidence of surrounding infection. The patient has been compliant with offloading 6/2; 2-week follow-up. The patient has 2 small open areas the original connecting wounds this is come in fairly dramatically since the last time he Thurmon, LYRIK BURESH. ([id number]) is here. I can no longer prove a connection undermining between the 2 areas which is a dramatic improvement. We have been using silver collagen. His daughter is changing the dressing 6/16; 2-week follow-up. The patient has 2 open areas the first is his original connecting area under the bridge of skin which I think is actually fully epithelialized. The remaining open area is what is left of the original large wound area. There is still a central slit that  has depth but I do not think any undermining we have been using endoform with a covering silver collagen 6/30; 2-week follow-up. He has 1 very small wound orifice but with over a centimeter of depth which is in the remaining large wound. The divot laterally is still closed this was a secondary wound there is no open area here. We have been using endoform however this orifice will not allow any similar dressing. 7/fourteen 2-week follow-up. There is nothing open over this wound surface. Some debris callus remains I see nothing looks threatening to opening. This was originally an infected decubitus ulcer in the middle of multiple other infectious issues including discitis at L4-L5, methicillin sensitive staph aureus sepsis. The wound was a decubitus ulcer that became infected at the time of the acute illness here. This was originally too large stage IV ulcers Electronic Signature(s) Signed: 02/06/2020 4:21:24 PM By: Linton Ham MD Entered By: Linton Ham on 02/06/2020 11:57:18 Petrovic, [name] ([id number]) -------------------------------------------------------------------------------- Physical Exam Details Patient Name: [name], [name] Date of Service: 02/06/2020 10:45 AM Medical Record Number: [id number] Patient Account Number: [id number] Date of Birth/Sex: 1965/08/17 (54 y.o. M) Treating RN: [name] Primary Care Provider: [name] Other Clinician: Referring Provider: [name] Treating Provider/Extender: [name] in Treatment: 75 Constitutional Patient is hypertensive.. Pulse regular [name] within target range for LambMarland Kitchen Respirations regular, non-labored [name] within target range.. Temperature is normal [name] within the target range for the LambMarland Kitchen appears in no distress. Notes Wound exam; the lower sacral wound appears to have closed over. Some debris on the surface of the wound which I looked at carefully there is no open area here. I am hopeful that the  probing depth we had last time is totally adhered although I do not think there is any way to exactly determine this. There was no infection around the wound Electronic Signature(s) Signed: 02/06/2020 4:21:24 PM By: Linton Ham MD Entered By: Linton Ham on 02/06/2020 11:58:18 Silversmith, [name] ([id number]) -------------------------------------------------------------------------------- Physician Orders Details Patient Name: [name], [name] Date of Service: 02/06/2020 10:45 AM Medical Record Number: [id number] Patient Account Number: [id number] Date of Birth/Sex: 06-Feb-1966 (54 y.o. M) Treating RN: [name] Primary Care Provider: [name] Other Clinician: Referring Provider: [name] Treating Provider/Extender: [name] in Treatment: 22 Verbal / Phone Orders: No Diagnosis Coding ICD-10 Coding Code Description L89.154 Pressure ulcer of sacral region, stage 4 T81.31XD Disruption of external operation (surgical) wound, not elsewhere classified, subsequent encounter L02.31 Cutaneous abscess of buttock Discharge From [facility] o Discharge from [place] - treatment complete Electronic Signature(s) Signed:  02/06/2020 12:03:05 PM By: Gretta Cool, BSN, RN, CWS, Kim RN, BSN Signed: 02/06/2020 4:21:24 PM By: Linton Ham MD Entered By: Gretta Cool, BSN, RN, CWS, Kim on 02/06/2020 12:03:05 Rocchio, [name] ([id number]) -------------------------------------------------------------------------------- Problem List Details Patient Name: Gesner, JAY HASKEW. Date of Service: 02/06/2020 10:45 AM Medical Record Number: [id number] Patient Account Number: [id number] Date of Birth/Sex: [birth year] (54 y.o. M) Treating RN: [name] Primary Care Provider: [name] Other Clinician: Referring Provider: [name] Treating Provider/Extender: [name] in Treatment: 22 Active Problems ICD-10 Encounter Code Description Active Date MDM Diagnosis L89.154  Pressure ulcer of sacral region, stage 4 09/05/2019 No Yes T81.31XD Disruption of external operation (surgical) wound, not elsewhere 09/05/2019 No Yes classified, subsequent encounter L02.31 Cutaneous abscess of buttock 09/05/2019 No Yes Inactive Problems Resolved Problems Electronic Signature(s) Signed: 02/06/2020 4:21:24 PM By: Linton Ham MD Entered By: Linton Ham on 02/06/2020 11:54:56 [place], [name] ([id number]) -------------------------------------------------------------------------------- Progress Note Details Patient Name: [name], [name] Date of Service: 02/06/2020 10:45 AM Medical Record Number: [id number] Patient Account Number: [id number] Date of Birth/Sex: [birth year] (54 y.o. M) Treating RN: [name] Primary Care Provider: [name] Other Clinician: Referring Provider: [name] Treating Provider/Extender: [name] in Treatment: 22 Subjective History of Present Illness (HPI) ADMISSION 09/05/2019 This is a 54 year old man who has a complicated history which started with acute low back pain sometime in September. Saw his primary doctor had a CT scan of the abdomen [name] pelvis that was normal. By late December he was discovered to have a lumbar epidural abscess with cauda equina syndrome. He was admitted to hospital [name] had a decompressive laminectomy at L3/L4/L5 blood cultures at the time showed MSSA he received IV nafcillin. He went to rehab from 1/5 through 1/15 there he was noted to have an "pressure injury in the skin" although I did not see much more about this in the discharge instructions. Unfortunately this wound became infected after he was discharged home. He required readmission from 1/25 through 2/2 with sepsis, discovered to have a right psoas abscess as well as an infected decubitus ulcer. He had a bedside debridement by general surgery on 1/25 interventional radiology drain the right psoas abscess. General surgery recommended 3 times  daily wet- to-dry dressings [name] an air mattress. He has since been discharged home. He is mobile with a walker. They are using 3 times daily wet-to-dry dressings. According to his family he is eating well. He has a protein supplement but he he is not taking it. He is currently on ampicillin sulbactam as recommended by [name] this is due to be finished currently on 2/22 although there seem to be some suggestion it might go longer. They say he had blood work 2 days ago which I will review. The last lab work I saw was a sedimentation rate of 81 [name] a C-reactive protein of 212 on 1/25. I did not see any imaging studies of the underlying bone although I will need to review the CT scans that he has had Past medical history, polymyalgia rheumatica, left total hip replacement in 2018 for avascular necrosis, cellulitis of the scrotum in July 2020, history of alcohol abuse but that is not currently problematic 09/12/2019; patient readmitted to the clinic last week. I put him on a wet-to-dry dressing with silver sorb gel to the larger wound area on his coccyx [name] proximal right buttocks. These are actually connected. In general the wound surfaces look quite good although they are deep [name] there is  a large amount of undermining. There is no exposed bone. Last lab work on 2/8 that was ordered by [name] showed a CRP of 68.5 [name] a sedimentation rate of greater than 130. The CRP was elevated versus 18.3 on 1/10 although it is difficult to interpret these necessarily in somebody with polymyalgia rheumatica. His white count was 15.7 hemoglobin 10.7 The patient is offloading this is much as he can says that he is eating [name] drinking well which seems verified by his family member. 2/24; patient with a large wound on the lower sacrum [name] proximal right buttocks. There is a bridge of overlying skin but the wounds are connected [name] there is wide undermining. We are using wet-to-dry dressings. I have reviewed Dr.  [name] notes of 09/17/2019 he is remove the PICC line. He had a prolonged course of IV antibiotics for the MSSA bacteremia, lumbar infection, psoas abscess [name] a necrotic sacral wound. Interestingly I noticed the same history of PMR in [place] link that [name] comments on. I went over the case again with the patient [name] his daughter he is totally unfamiliar with anything to do with polymyalgia rheumatica or its obvious symptoms that I also described. I told him that this is not a diagnosis that most doctors would make [name] put in an E HR without documentation but he is just not familiar with it [name] neither his his daughter. For now we are using wet-to-dry dressings. I think it is time to try a wound VAC now that we are certain about underlying infection issues 3/3; he arrives today with his wound actually looks somewhat better. Two small areas with a bridge of skin actually have contracted I believe there is less depth. There is still extensive undermining. He got his VAC machine yesterday but they have still not put it on. 3/10; the orifice of the actual wound is contracting faster than the undermining dimensions of the wound. This is probably soon going to make this difficult to continue with the [name] 10/15/2019 upon evaluation today patient appears to be doing well in general with regard to his wound. Apparently this looks good although I have not really seen him he has been seen by [name] up to this point. Nonetheless he is currently on a wound VAC [name] apparently has been having some odor over the past several days which is why comes in for evaluation earlier today. I am going obtain a wound culture [name] then subsequently we will likely place the patient on an antibiotic at this point to try to help out with the odor which I presume is coming from a bacterial infection. The patient has been off of all antibiotics for the past month having ended on 22 February. With that being said he  is not having any increased pain or anything else going on at this point which is good news. He is not allergic to any antibiotics that he is aware of. 4/7; it has been almost a month since I saw this patient although he was here 2 weeks ago. He had a wound VAC on this wound. I note that he was treated for infection with 2 weeks of Levaquin. We had a report recently from home health that there was an extensive rash apparently in the periwound [name] they took the [facility] off for a few days however the rash appears to have cleaned up [name] the wound VAC was back on on his presentation today. Since I saw this last the year  is been considerable reduction in overall wound area 4/21; 2-week follow-up. Still using the wound VAC although I am not completely certain that [place] home health is putting this on continuously per discussion with the patient's family. He still has the 2 connecting wounds. The larger area is more midline the smaller area to the right. There is no undermining beyond the smaller wound that I was able to determine. However the larger wound slopes towards that area. We have some improvement in the overall wound measurements still using the wound VAC. 5/5; 2-week follow-up. We held the wound VAC last week largely as the wound [name] closed down. There is still 2 open areas the more midline area [name] the area towards the right separated by normal skin. We are using silver alginate 5/19; 2-week follow-up. We discontinued the wound VAC the area has closed down tremendously he now has 2 small open areas separated by normal skin. Not much change using silver alginate from last time. The 2 areas connect but there is not a lot of additional undermining here. No evidence of surrounding infection. The patient has been compliant with offloading 6/2; 2-week follow-up. The patient has 2 small open areas the original connecting wounds this is come in fairly dramatically since the last time he is here. I can no  longer prove a connection undermining between the 2 areas which is a dramatic improvement. We have been using silver collagen. His daughter is changing the dressing 6/16; 2-week follow-up. The patient has 2 open areas the first is his original connecting area under the bridge of skin which I think is actually fully epithelialized. The remaining open area is what is left of the original large wound area. There is still a central slit that has depth but I do not think [name], [name] ([id number]) any undermining we have been using endoform with a covering silver collagen 6/30; 2-week follow-up. He has 1 very small wound orifice but with over a centimeter of depth which is in the remaining large wound. The divot laterally is still closed this was a secondary wound there is no open area here. We have been using endoform however this orifice will not allow any similar dressing. 7/fourteen 2-week follow-up. There is nothing open over this wound surface. Some debris callus remains I see nothing looks threatening to opening. This was originally an infected decubitus ulcer in the middle of multiple other infectious issues including discitis at L4-L5, methicillin sensitive staph aureus sepsis. The wound was a decubitus ulcer that became infected at the time of the acute illness here. This was originally too large stage IV ulcers Objective Constitutional Patient is hypertensive.. Pulse regular [name] within target range for LambMarland Kitchen Respirations regular, non-labored [name] within target range.. Temperature is normal [name] within the target range for the LambMarland Kitchen appears in no distress. Vitals Time Taken: 10:55 AM, Height: 72 in, Weight: 230 lbs, BMI: 31.2, Temperature: 98.1 F, Pulse: 102 bpm, Respiratory Rate: 16 breaths/min, Blood Pressure: 144/85 mmHg. General Notes: Wound exam; the lower sacral wound appears to have closed over. Some debris on the surface of the wound which I looked at carefully there is no  open area here. I am hopeful that the probing depth we had last time is totally adhered although I do not think there is any way to exactly determine this. There was no infection around the wound Integumentary (Hair, Skin) Wound #1 status is Healed - Epithelialized. Original cause of wound was Gradually Appeared. The wound is located on  the Midline Sacrum. The wound measures 0cm length x 0cm width x 0cm depth; 0cm^2 area [name] 0cm^3 volume. There is no tunneling or undermining noted. There is a none present amount of drainage noted. There is no granulation within the wound bed. There is no necrotic tissue within the wound bed. Assessment Active Problems ICD-10 Pressure ulcer of sacral region, stage 4 Disruption of external operation (surgical) wound, not elsewhere classified, subsequent encounter Cutaneous abscess of buttock Plan 1. The patient can be discharged from the clinic 2. I have advised to keep border foam over this for the next 3 or 4 weeks to help maintain stability of a regional very problematic area. 3. We have only dealt with one issue of the issues that the patient had late last year. I do not know about the rest of this including the discitis psoas abscess. I am assuming he follows are followed with infectious disease I have not been really checking on this. Nevertheless there is been no obvious infection in this wound of late Electronic Signature(s) Signed: 02/06/2020 4:21:24 PM By: Linton Ham MD Entered By: Linton Ham on 02/06/2020 12:00:01 Shuey, [name] ([id number]) -------------------------------------------------------------------------------- [place] Details Patient Name: Berrey, [name] Date of Service: 02/06/2020 Medical Record Number: [id number] Patient Account Number: [id number] Date of Birth/Sex: 01/09/1966 (54 y.o. M) Treating RN: [name] Primary Care Provider: [name] Other Clinician: Referring Provider: [name] Treating  Provider/Extender: [name] in Treatment: 22 Diagnosis Coding ICD-10 Codes Code Description L89.154 Pressure ulcer of sacral region, stage 4 T81.31XD Disruption of external operation (surgical) wound, not elsewhere classified, subsequent encounter L02.31 Cutaneous abscess of buttock Facility Procedures CPT4 Code: [id number] Description: 59935 - WOUND CARE VISIT-LEV 2 EST PT Modifier: Quantity: 1 Physician Procedures CPT4 Code Description: [id number] 90300 - WC PHYS LEVEL 2 - EST PT Modifier: Quantity: 1 CPT4 Code Description: ICD-10 Diagnosis Description L89.154 Pressure ulcer of sacral region, stage 4 T81.31XD Disruption of external operation (surgical) wound, not elsewhere classi Modifier: fied, subsequent enco Quantity: Personal assistant) Signed: 02/06/2020 5:17:40 PM By: Gretta Cool, BSN, RN, CWS, Kim RN, BSN Previous Signature: 02/06/2020 4:21:24 PM Version By: Linton Ham MD Entered By: Gretta Cool, BSN, RN, CWS, Kim on 02/06/2020 17:17:39

## 2020-02-07 ENCOUNTER — Ambulatory Visit (INDEPENDENT_AMBULATORY_CARE_PROVIDER_SITE_OTHER): Payer: BC Managed Care – PPO | Admitting: Rehabilitative and Restorative Service Providers"

## 2020-02-07 ENCOUNTER — Other Ambulatory Visit: Payer: Self-pay

## 2020-02-07 ENCOUNTER — Encounter: Payer: Self-pay | Admitting: Rehabilitative and Restorative Service Providers"

## 2020-02-07 DIAGNOSIS — M25552 Pain in left hip: Secondary | ICD-10-CM | POA: Diagnosis not present

## 2020-02-07 DIAGNOSIS — R262 Difficulty in walking, not elsewhere classified: Secondary | ICD-10-CM

## 2020-02-07 DIAGNOSIS — M6281 Muscle weakness (generalized): Secondary | ICD-10-CM | POA: Diagnosis not present

## 2020-02-07 NOTE — Therapy (Signed)
[facility] [address] [place], [place], [address] Phone: [phone]   Fax:  [phone]  Physical Therapy Evaluation  Patient Details  Name: [name] MRN: [id number] Date of Birth: [birth year] Referring Provider (PT): Dr. 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   History of chicken pox    Medical history non-contributory     Past Surgical History:  Procedure Laterality Date   CARPAL TUNNEL RELEASE Bilateral 2009   CATARACT EXTRACTION W/PHACO Left 12/17/2019   Procedure: CATARACT EXTRACTION PHACO AND INTRAOCULAR LENS PLACEMENT ([place]) LEFT;  Surgeon: Eulogio Bear, MD;  Location: [place];  Service: Ophthalmology;  Laterality: Left;  3.61 0:26.9   COLONOSCOPY W/ POLYPECTOMY     JOINT REPLACEMENT     LACERATION REPAIR Right ~ 2012   leg   LUMBAR LAMINECTOMY/DECOMPRESSION MICRODISCECTOMY N/A 07/24/2019   Procedure: Decompressive Lumbar Laminectomy Lumbar three-four Lumbar four-five for Epidural Abscess;  Surgeon: Kary Kos, MD;  Location: [place];  Service: Neurosurgery;  Laterality: N/A;   TOTAL HIP ARTHROPLASTY Right 01/11/2013   Procedure: TOTAL HIP ARTHROPLASTY;  Surgeon: Garald Balding, MD;  Location: [place];  Service: Orthopedics;  Laterality: Right;   TOTAL HIP ARTHROPLASTY Left 10/12/2016   Procedure: TOTAL HIP ARTHROPLASTY;  Surgeon: Garald Balding, MD;  Location: [place];  Service: Orthopedics;  Laterality: Left;    There were no vitals filed for this  visit.    Subjective Assessment - 02/07/20 1016    Subjective Pt. stated having decreased strenth in legs and feels pop in Lt hip at times c bending forward to put on socks/shoes.  Pt. stated leaning back can help improve and pop occurs then.  Pt. stated severe pain when pop occurs.  Has used cane/walker in past due to symptoms.   Hasn't worked since Dec 2020, had infection in back that led to extended hospital stay.    Pertinent History Last MD note: Patient presents today for left hip pain. He said that in the last 2-36months he has noticed that if he bends over and lifts his leg to put on socks his left hip will pop and immediately very painful. He said that his hip stays sore. He had his left hip replaced with Dr.Whitfield in 2018. He said that he has lost a lot of weight and feels like that has caused his hip to start popping. He takes Oxycodone as prescribed by his PCP.  He denies any fever shakes or chills.    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 Yes    Pain Score 0-No pain   at worst 10/10   Pain Location Hip    Pain Orientation Left    Pain Descriptors / Indicators Sharp    Pain Type Chronic pain    Pain Onset More than  a month ago    Pain Frequency Intermittent    Aggravating Factors  bending forward, transfers, walking    Pain Relieving Factors avoiding that movement    Effect of Pain on Daily Activities Unable to work.              [facility] PT Assessment - 02/07/20 0001      Assessment   Medical Diagnosis Lt hip pain    Referring Provider (PT) Dr. Durward Fortes    Onset Date/Surgical Date 08/27/19      Precautions   Precaution Comments Previous Lt THA 2018, Rt 2014, history of psoas abcess      Restrictions   Weight Bearing Restrictions No      Balance Screen   Has the patient fallen in the past 6 months No    Has the patient had a decrease in activity level  because of a fear of falling?  Yes    Is the patient reluctant to leave their home because of a fear of falling?  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)   [employer] person work       Cognition   Overall Cognitive Status Within Advertising copywriter for tasks assessed      Functional Tests   Functional tests Single leg stance      Single Leg Stance   Comments Rt SLS 8 seconds, Lt < 3 seconds      ROM / Strength   AROM / PROM / Strength Strength;PROM;AROM      AROM   Overall AROM Comments pain at end range flexion, er Lt hip    AROM Assessment Site Hip;Lumbar    Right/Left Hip Left;Right    Right Hip Flexion 105    Right Hip External Rotation  45    Left Hip Flexion 90    Left Hip External Rotation  25      PROM   Overall PROM Comments pain at end range Lt hip flexion, Er    PROM Assessment Site Hip    Right/Left Hip Left;Right    Right Hip Flexion 115    Right Hip External Rotation  50    Right Hip Internal Rotation  25    Left Hip Flexion 90    Left Hip External Rotation  30    Left Hip Internal Rotation  25      Strength   Overall Strength Comments Concordant pain anterior/lateral Lt hip c flexion, abd mmt Lt     Strength Assessment Site Hip;Ankle;Knee    Right/Left Hip Left;Right    Right Hip Flexion 5/5    Left Hip Flexion 4/5    Left Hip ABduction 3+/5    Right/Left Knee Left;Right    Right Knee Flexion 5/5    Right Knee Extension 5/5    Left Knee Flexion 5/5    Left Knee Extension 4/5    Right/Left Ankle Left;Right    Right Ankle Dorsiflexion 5/5    Left Ankle Dorsiflexion 5/5      Flexibility   Soft Tissue Assessment /Muscle Length yes    Quadriceps (+)thomas test Lt hip       Palpation  Palpation comment Tenderness and TrP noted in rectus femoris on Lt hip, TFL, glute med      Transfers   Comments Able to perform sit to stand from 18 inch chair c deviation to Rt LE , pain , increased difficulty      Ambulation/Gait   Ambulation/Gait Yes    Gait Pattern Decreased stride length;Decreased stance time - left;Decreased weight shift to left;Left circumduction                      Objective measurements completed on examination: See above findings.       [place] [facility] PT Treatment/Exercise - 02/07/20 0001      Self-Care   Self-Care Other Self-Care Comments    Other Self-Care Comments  Cues and advice given on positioning and adaptation of self care dressing activity to limit end range stress on Lt hip, as well as general exercise review, POC suggestions for generalized weakness/fatigue since hospitalization      Exercises   Exercises Other Exercises    Other Exercises  HEP instruction/performance consisting of supine bridge, clam shell, thomas test stretch and sit to stand transfers as documented in Pt.  instruction section      Manual Therapy   Manual therapy comments active compression to Lt glute med c movement                  PT Education - 02/07/20 1010    Education Details HEP, POC    Person(s) Educated Patient    Methods Explanation;Demonstration;Handout;Verbal cues    Comprehension Returned demonstration;Verbalized understanding               PT Long Term Goals - 02/07/20 1010      PT LONG TERM GOAL #1   Title Patient will demonstrate/report pain at worst less than or equal to 2/10 to facilitate minimal limitation in daily activity secondary to pain symptoms.    Status New    Target Date 03/20/20      PT LONG TERM GOAL #2   Title Patient will demonstrate independent use of home exercise program to facilitate ability to maintain/progress functional gains from skilled physical therapy services.    Status New     Target Date 03/20/20      PT LONG TERM GOAL #3   Title Pt. will demonstrate community distance ambulation >500 ft s deviation /symptoms for usual daily actiivty at PLOF.    Target Date 03/20/20      PT LONG TERM GOAL #4   Title Pt. will demonstrate Lt hip AROM equal to Rt to facilitate self care/dressing.    Status New    Target Date 03/20/20      PT LONG TERM GOAL #5   Title Pt. will demonstrate bilateral LE MMT 5/5 to facilitate usual daily activity, work return at [employer].    Status New    Target Date 03/20/20      Additional Long Term Goals   Additional Long Term Goals Yes      PT LONG TERM GOAL #6   Title Pt. will demonstrate/report ability to return to work at [employer].    Status New    Target Date 03/20/20                  Plan - 02/07/20 1011    Clinical Impression Statement Patient is a 54 y.o. male who comes to clinic with complaints of Lt hip pain with mobility, strength and movement coordination  deficits that impair their ability to perform usual daily and recreational functional activities without increase difficulty/symptoms at this time.  Patient to benefit from skilled PT services to address impairments and limitations to improve to previous level of function without restriction secondary to condition.    Personal Factors and Comorbidities Comorbidity 3+    Comorbidities 2014 R THA, 2018 L THA, decompressive lumbar laminectomy 06/2019, Cauda Equina Syndrome 07/2019, psoas abcess history    Examination-Activity Limitations Sit;Squat;Stairs;Stand;Transfers;Hygiene/Grooming;Dressing    Examination-Participation Restrictions Community Activity;Interpersonal Relationship;Other   work   Merchant navy officer Evolving/Moderate complexity    Clinical Decision Making Moderate    Rehab Potential --   fair to good   PT Frequency --   1-2x/week   PT Duration 6 weeks    PT Treatment/Interventions ADLs/Self Care Home Management;Electrical Stimulation;Iontophoresis  4mg /ml Dexamethasone;Cryotherapy;Moist Heat;Traction;Balance training;Therapeutic exercise;Therapeutic activities;Functional mobility training;Stair training;Gait training;Ultrasound;Neuromuscular re-education;Patient/family education;Manual techniques;Taping;Joint Manipulations;Passive range of motion;Dry needling;Spinal Manipulations    PT Next Visit Plan Reassess HEP use, hip strengthening, balance, ER range improvement for dressing.    PT Home Exercise Plan [facility]    Consulted and Agree with Plan of Care Patient           Patient will benefit from skilled therapeutic intervention in order to improve the following deficits and impairments:  Abnormal gait, Decreased endurance, Decreased skin integrity, Hypomobility, Decreased activity tolerance, Decreased strength, Pain, Increased muscle spasms, Difficulty walking, Decreased mobility, Decreased balance, Decreased range of motion, Impaired flexibility, Decreased coordination  Visit Diagnosis: Pain in left hip  Muscle weakness (generalized)  Difficulty in walking, not elsewhere classified     Problem List Patient Active Problem List   Diagnosis Date Noted   Adjustment disorder with depressed mood 11/19/2019   Left hip pain 10/09/2019   Blurry vision, left eye 10/09/2019   Infected pressure ulcer 08/20/2019   Hyponatremia 08/20/2019   Pressure injury of skin 08/02/2019   Lumbar discitis 07/30/2019   S/P lumbar laminectomy 07/25/2019   H/O MSSA epidural abscess, L2-L5 07/25/2019   Anemia 07/18/2019   Obesity (BMI 35.0-39.9 without comorbidity) 07/18/2019   Constipation 07/18/2019   Elevated blood sugar 04/01/2017   Avascular necrosis of bone of hip, left ([place]) 10/12/2016   Alcohol abuse, in remission 10/12/2016   S/P total hip arthroplasty 10/12/2016    Scot Jun, PT, DPT, OCS, ATC 02/07/20  11:15 AM    [facility] [address] [place], [place], [address] Phone:  [phone]   Fax:  [phone]  Name: [name] MRN: [id number] Date of Birth: 01/10/1966

## 2020-02-07 NOTE — Patient Instructions (Signed)
Access Code: [facility] URL: [email or web address] Date: 02/07/2020 Prepared by: Scot Jun  Exercises Supine Bridge - 2 x daily - 7 x weekly - 10 reps - 3 sets - 2 hold Hip Flexor Stretch at Edge of Bed - 2 x daily - 7 x weekly - 1 sets - 5 reps - 15-20 hold Clamshell - 1 x daily - 7 x weekly - 3 sets - 10 reps Sit to Stand - 1 x daily - 7 x weekly - 3 sets - 10 reps

## 2020-02-07 NOTE — Progress Notes (Signed)
Brillhart, DEMITRIOS MOLYNEUX ([id number]) Visit Report for 02/06/2020 Arrival Information Details Patient Name: [name], [name] Date of Service: 02/06/2020 10:45 AM Medical Record Number: [id number] Patient Account Number: [id number] Date of Birth/Sex: 1966-04-16 (54 y.o. M) Treating RN: Army Melia Primary Care Pearlie Nies: [name] Other Clinician: Referring Mindy Behnken: [name] Treating Brittnee Gaetano/Extender: [name] in Treatment: 26 Visit Information History Since Last Visit Added or deleted any medications: No Patient Arrived: Ambulatory Any new allergies or adverse reactions: No Arrival Time: 10:55 Had a fall or experienced change in No Accompanied By: daughter activities of daily living that may affect Transfer Assistance: None risk of falls: Patient Identification Verified: Yes Signs or symptoms of abuse/neglect since last visito No Hospitalized since last visit: No Has Dressing in Place as Prescribed: Yes Pain Present Now: No Electronic Signature(s) Signed: 02/06/2020 4:08:07 PM By: Army Melia Entered By: Army Melia on 02/06/2020 10:55:26 Marcussen, [name] ([id number]) -------------------------------------------------------------------------------- Clinic Level of Care Assessment Details Patient Name: Coventry, [name] Date of Service: 02/06/2020 10:45 AM Medical Record Number: [id number] Patient Account Number: [id number] Date of Birth/Sex: 1965-12-23 (54 y.o. M) Treating RN: [name] Primary Care Baer Hinton: [name] Other Clinician: Referring Carley Strickling: [name] Treating Anastashia Westerfeld/Extender: [name] in Treatment: 22 Clinic Level of Care Assessment Items TOOL 4 Quantity Score []  - Use when only an EandM is performed on FOLLOW-UP visit 0 ASSESSMENTS - Nursing Assessment / Reassessment X - Reassessment of Co-morbidities (includes updates in patient status) 1 10 X- 1 5 Reassessment of Adherence to Treatment Plan ASSESSMENTS - Wound and Skin  Assessment / Reassessment X - Simple Wound Assessment / Reassessment - one wound 1 5 []  - 0 Complex Wound Assessment / Reassessment - multiple wounds []  - 0 Dermatologic / Skin Assessment (not related to wound area) ASSESSMENTS - Focused Assessment []  - Circumferential Edema Measurements - multi extremities 0 []  - 0 Nutritional Assessment / Counseling / Intervention []  - 0 Lower Extremity Assessment (monofilament, tuning fork, pulses) []  - 0 Peripheral Arterial Disease Assessment (using hand held doppler) ASSESSMENTS - Ostomy and/or Continence Assessment and Care []  - Incontinence Assessment and Management 0 []  - 0 Ostomy Care Assessment and Management (repouching, etc.) PROCESS - Coordination of Care X - Simple Patient / Family Education for ongoing care 1 15 []  - 0 Complex (extensive) Patient / Family Education for ongoing care []  - 0 Staff obtains Programmer, systems, Records, Test Results / Process Orders []  - 0 Staff telephones HHA, Nursing Homes / Clarify orders / etc []  - 0 Routine Transfer to another Facility (non-emergent condition) []  - 0 Routine Hospital Admission (non-emergent condition) []  - 0 New Admissions / Biomedical engineer / Ordering NPWT, Apligraf, etc. []  - 0 Emergency Hospital Admission (emergent condition) X- 1 10 Simple Discharge Coordination []  - 0 Complex (extensive) Discharge Coordination PROCESS - Special Needs []  - Pediatric / Minor Patient Management 0 []  - 0 Isolation Patient Management []  - 0 Hearing / Language / Visual special needs []  - 0 Assessment of Community assistance (transportation, D/C planning, etc.) []  - 0 Additional assistance / Altered mentation []  - 0 Support Surface(s) Assessment (bed, cushion, seat, etc.) INTERVENTIONS - Wound Cleansing / Measurement Vonbargen, Johney R. ([id number]) X- 1 5 Simple Wound Cleansing - one wound []  - 0 Complex Wound Cleansing - multiple wounds X- 1 5 Wound Imaging (photographs - any number of  wounds) []  - 0 Wound Tracing (instead of photographs) X- 1 5 Simple Wound Measurement - one wound []  -  0 Complex Wound Measurement - multiple wounds INTERVENTIONS - Wound Dressings X - Small Wound Dressing one or multiple wounds 1 10 []  - 0 Medium Wound Dressing one or multiple wounds []  - 0 Large Wound Dressing one or multiple wounds []  - 0 Application of Medications - topical []  - 0 Application of Medications - injection INTERVENTIONS - Miscellaneous []  - External ear exam 0 []  - 0 Specimen Collection (cultures, biopsies, blood, body fluids, etc.) []  - 0 Specimen(s) / Culture(s) sent or taken to Lab for analysis []  - 0 Patient Transfer (multiple staff / Civil Service fast streamer / Similar devices) []  - 0 Simple Staple / Suture removal (25 or less) []  - 0 Complex Staple / Suture removal (26 or more) []  - 0 Hypo / Hyperglycemic Management (close monitor of Blood Glucose) []  - 0 Ankle / Brachial Index (ABI) - do not check if billed separately X- 1 5 Vital Signs Has the patient been seen at the hospital within the last three years: Yes Total Score: 75 Level Of Care: New/Established - Level 2 Electronic Signature(s) Signed: 02/06/2020 6:25:04 PM By: Gretta Cool, BSN, RN, CWS, Kim RN, BSN Entered By: Gretta Cool, BSN, RN, CWS, Kim on 02/06/2020 12:03:30 Wilcock, [name] ([id number]) -------------------------------------------------------------------------------- Encounter Discharge Information Details Patient Name: Stawicki, [name] Date of Service: 02/06/2020 10:45 AM Medical Record Number: [id number] Patient Account Number: [id number] Date of Birth/Sex: July 01, 1966 (54 y.o. M) Treating RN: [name] Primary Care Goerge Mohr: [name] Other Clinician: Referring Naquita Nappier: [name] Treating Jamielyn Petrucci/Extender: [name] in Treatment: 22 Encounter Discharge Information Items Discharge Condition: Stable Ambulatory Status: Ambulatory Discharge Destination: Home Transportation:  Private Auto Accompanied By: self Schedule Follow-up Appointment: Yes Clinical Summary of Care: Electronic Signature(s) Signed: 02/06/2020 12:04:08 PM By: Gretta Cool, BSN, RN, CWS, Kim RN, BSN Entered By: Gretta Cool, BSN, RN, CWS, Kim on 02/06/2020 12:04:07 Debord, [name] ([id number]) -------------------------------------------------------------------------------- Lower Extremity Assessment Details Patient Name: Kosloski, [name] Date of Service: 02/06/2020 10:45 AM Medical Record Number: [id number] Patient Account Number: [id number] Date of Birth/Sex: 1966-03-28 (54 y.o. M) Treating RN: Army Melia Primary Care Talyn Eddie: [name] Other Clinician: Referring Ashrith Sagan: [name] Treating Livio Ledwith/Extender: Ricard Dillon Weeks in Treatment: 22 Electronic Signature(s) Signed: 02/06/2020 4:08:07 PM By: Army Melia Entered By: Army Melia on 02/06/2020 11:03:25 Cisek, [name] ([id number]) -------------------------------------------------------------------------------- Multi Wound Chart Details Patient Name: Laino, [name] Date of Service: 02/06/2020 10:45 AM Medical Record Number: [id number] Patient Account Number: [id number] Date of Birth/Sex: 1966/03/23 (54 y.o. M) Treating RN: [name] Primary Care Venice Marcucci: [name] Other Clinician: Referring Chigozie Basaldua: [name] Treating Leasia Swann/Extender: [name] in Treatment: 22 Vital Signs Height(in): 72 Pulse(bpm): 102 Weight(lbs): 230 Blood Pressure(mmHg): 144/85 Body Mass Index(BMI): 31 Temperature(F): 98.1 Respiratory Rate(breaths/min): 16 Photos: [N/A:N/A] Wound Location: Midline Sacrum N/A N/A Wounding Event: Gradually Appeared N/A N/A Primary Etiology: Abscess N/A N/A Date Acquired: 07/24/2019 N/A N/A Weeks of Treatment: 22 N/A N/A Wound Status: Healed - Epithelialized N/A N/A Measurements L x W x D (cm) 0x0x0 N/A N/A Area (cm) : 0 N/A N/A Volume (cm) : 0 N/A N/A % Reduction in Area: 100.00% N/A  N/A % Reduction in Volume: 100.00% N/A N/A Classification: Full Thickness With Exposed N/A N/A Support Structures Exudate Amount: None Present N/A N/A Granulation Amount: None Present (0%) N/A N/A Necrotic Amount: None Present (0%) N/A N/A Exposed Structures: Fascia: No N/A N/A Fat Layer (Subcutaneous Tissue) Exposed: No Tendon: No Muscle: No Joint: No Bone: No Epithelialization: Large (67-100%) N/A N/A Treatment  Notes Electronic Signature(s) Signed: 02/06/2020 12:02:10 PM By: Gretta Cool, BSN, RN, CWS, Kim RN, BSN Entered By: Gretta Cool, BSN, RN, CWS, Kim on 02/06/2020 12:02:10 Stahlman, [name] ([id number]) -------------------------------------------------------------------------------- [place] Details Patient Name: Bargar, JOSEPH BIAS. Date of Service: 02/06/2020 10:45 AM Medical Record Number: [id number] Patient Account Number: [id number] Date of Birth/Sex: 05/20/1966 (54 y.o. M) Treating RN: [name] Primary Care Tilia Faso: [name] Other Clinician: Referring Carolynne Schuchard: [name] Treating Vernita Tague/Extender: [name] in Treatment: 22 Active Inactive Electronic Signature(s) Signed: 02/06/2020 12:02:02 PM By: Gretta Cool, BSN, RN, CWS, Kim RN, BSN Entered By: Gretta Cool, BSN, RN, CWS, Kim on 02/06/2020 12:02:01 Sgro, [name] ([id number]) -------------------------------------------------------------------------------- Pain Assessment Details Patient Name: Simonet, [name] Date of Service: 02/06/2020 10:45 AM Medical Record Number: [id number] Patient Account Number: [id number] Date of Birth/Sex: 1965-10-08 (54 y.o. M) Treating RN: Army Melia Primary Care Yamaris Cummings: [name] Other Clinician: Referring Leldon Steege: [name] Treating Iyanah Demont/Extender: [name] in Treatment: 22 Active Problems Location of Pain Severity and Description of Pain Patient Has Paino No Site Locations Pain Management and Medication Current Pain  Management: Electronic Signature(s) Signed: 02/06/2020 4:08:07 PM By: Army Melia Entered By: Army Melia on 02/06/2020 10:56:02 Lefevers, [name] ([id number]) -------------------------------------------------------------------------------- Patient/Caregiver Education Details Patient Name: Nam, [name] Date of Service: 02/06/2020 10:45 AM Medical Record Number: [id number] Patient Account Number: [id number] Date of Birth/Gender: [birth year] (54 y.o. M) Treating RN: [name] Primary Care Physician: [name] Other Clinician: Referring Physician: [name] Treating Physician/Extender: [name] in Treatment: 22 Education Assessment Education Provided To: Patient Education Topics Provided Pressure: Handouts: Preventing Pressure Ulcers Methods: Demonstration, Explain/Verbal Responses: State content correctly Electronic Signature(s) Signed: 02/06/2020 6:25:04 PM By: Gretta Cool, BSN, RN, CWS, Kim RN, BSN Entered By: Gretta Cool, BSN, RN, CWS, Kim on 02/06/2020 12:03:48 Goodgame, [name] ([id number]) -------------------------------------------------------------------------------- Wound Assessment Details Patient Name: Fernandez, [name] Date of Service: 02/06/2020 10:45 AM Medical Record Number: [id number] Patient Account Number: [id number] Date of Birth/Sex: 19-Mar-1966 (54 y.o. M) Treating RN: [name] Primary Care Belvia Gotschall: [name] Other Clinician: Referring Earmon Sherrow: [name] Treating Dianey Suchy/Extender: [name] in Treatment: 22 Wound Status Wound Number: 1 Primary Etiology: Abscess Wound Location: Midline Sacrum Wound Status: Healed - Epithelialized Wounding Event: Gradually Appeared Date Acquired: 07/24/2019 Weeks Of Treatment: 22 Clustered Wound: No Photos Wound Measurements Length: (cm) 0 Width: (cm) 0 Depth: (cm) 0 Area: (cm) 0 Volume: (cm) 0 % Reduction in Area: 100% % Reduction in Volume: 100% Epithelialization: Large  (67-100%) Tunneling: No Undermining: No Wound Description Classification: Full Thickness With Exposed Support Structures Exudate Amount: None Present Wound Bed Granulation Amount: None Present (0%) Exposed Structure Necrotic Amount: None Present (0%) Fascia Exposed: No Fat Layer (Subcutaneous Tissue) Exposed: No Tendon Exposed: No Muscle Exposed: No Joint Exposed: No Bone Exposed: No Electronic Signature(s) Signed: 02/06/2020 6:25:04 PM By: Gretta Cool, BSN, RN, CWS, Kim RN, BSN Entered By: Gretta Cool, BSN, RN, CWS, Kim on 02/06/2020 11:14:35 Strine, [name] ([id number]) -------------------------------------------------------------------------------- [place] Details Patient Name: Pluta, [name] Date of Service: 02/06/2020 10:45 AM Medical Record Number: [id number] Patient Account Number: [id number] Date of Birth/Sex: December 29, 1965 (54 y.o. M) Treating RN: Army Melia Primary Care Matilyn Fehrman: [name] Other Clinician: Referring Kae Lauman: [name] Treating Skanda [name]/Extender: [name] in Treatment: 22 Vital Signs Time Taken: 10:55 Temperature (F): 98.1 Height (in): 72 Pulse (bpm): 102 Weight (lbs): 230 Respiratory Rate (breaths/min): 16 Body Mass Index (BMI): 31.2 Blood Pressure (mmHg): 144/85 Reference Range: 80 - 120  mg / dl Electronic Signature(s) Signed: 02/06/2020 4:08:07 PM By: Army Melia Entered By: Army Melia on 02/06/2020 10:55:55

## 2020-02-11 ENCOUNTER — Ambulatory Visit: Payer: BC Managed Care – PPO | Admitting: Psychology

## 2020-02-14 ENCOUNTER — Ambulatory Visit (INDEPENDENT_AMBULATORY_CARE_PROVIDER_SITE_OTHER): Payer: BC Managed Care – PPO | Admitting: Physical Therapy

## 2020-02-14 ENCOUNTER — Other Ambulatory Visit: Payer: Self-pay

## 2020-02-14 ENCOUNTER — Encounter: Payer: Self-pay | Admitting: Physical Therapy

## 2020-02-14 DIAGNOSIS — R262 Difficulty in walking, not elsewhere classified: Secondary | ICD-10-CM

## 2020-02-14 DIAGNOSIS — M6281 Muscle weakness (generalized): Secondary | ICD-10-CM

## 2020-02-14 DIAGNOSIS — M25552 Pain in left hip: Secondary | ICD-10-CM

## 2020-02-14 NOTE — Therapy (Signed)
[facility] Physical Therapy [address] [place], [place], [address] Phone: [phone]   Fax:  [phone]  Physical Therapy Treatment  Patient Details  Name: [name] MRN: [id number] Date of Birth: [birth year] Referring Provider (PT): Dr. Durward Fortes   Encounter Date: 02/14/2020   PT End of Session - 02/14/20 0913    Visit Number 2    Number of Visits 12    Date for PT Re-Evaluation 03/20/20    PT Start Time 0800    PT Stop Time 0842    PT Time Calculation (min) 42 min    Activity Tolerance Patient tolerated treatment well    Behavior During Therapy [facility] for tasks assessed/performed           Past Medical History:  Diagnosis Date  . Cauda equina syndrome ([place]) 07/31/2019  . Cerebral septic emboli ([place]) 07/30/2019  . History of chicken pox   . Medical history non-contributory     Past Surgical History:  Procedure Laterality Date  . CARPAL TUNNEL RELEASE Bilateral 2009  . CATARACT EXTRACTION W/PHACO Left 12/17/2019   Procedure: CATARACT EXTRACTION PHACO AND INTRAOCULAR LENS PLACEMENT (IOC) LEFT;  Surgeon: Eulogio Bear, MD;  Location: [place];  Service: Ophthalmology;  Laterality: Left;  3.61 0:26.9  . COLONOSCOPY W/ POLYPECTOMY    . JOINT REPLACEMENT    . LACERATION REPAIR Right ~ 2012   leg  . LUMBAR LAMINECTOMY/DECOMPRESSION MICRODISCECTOMY N/A 07/24/2019   Procedure: Decompressive Lumbar Laminectomy Lumbar three-four Lumbar four-five for Epidural Abscess;  Surgeon: Kary Kos, MD;  Location: [place];  Service: Neurosurgery;  Laterality: N/A;  . TOTAL HIP ARTHROPLASTY Right 01/11/2013   Procedure: TOTAL HIP ARTHROPLASTY;  Surgeon: Garald Balding, MD;  Location: [place];  Service: Orthopedics;  Laterality: Right;  . TOTAL HIP ARTHROPLASTY Left 10/12/2016   Procedure: TOTAL HIP ARTHROPLASTY;  Surgeon: Garald Balding, MD;  Location: [place];  Service: Orthopedics;  Laterality: Left;    There were no vitals filed for this visit.    Subjective Assessment - 02/14/20 0810    Subjective Pt arriving to therapy today reporting 4/10 LBP. Pt reporting 4/10 5/10 pain in left hip. Pt stating he doesn't want to bend over because his left hip feels like "a borad breaking and can bring tears to his eyes".    Pertinent History Last MD note: Patient presents today for left hip pain. He said that in the last 2-3months he has noticed that if he bends over and lifts his leg to put on socks his left hip will pop and immediately very painful. He said that his hip stays sore. He had his left hip replaced with Dr.Whitfield in 2018. He said that he has lost a lot of weight and feels like that has caused his hip to start popping. He takes Oxycodone as prescribed by his PCP.  He denies any fever shakes or chills.    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 Yes    Pain Score 5     Pain Location Hip    Pain Orientation Left    Pain Descriptors / Indicators Sharp    Pain Type Chronic pain    Pain Onset More than a month ago    Pain Frequency Intermittent  [place] Adult PT Treatment/Exercise - 02/14/20 0001      Exercises   Exercises Knee/Hip      Knee/Hip Exercises: Stretches   Active Hamstring Stretch Both;2 reps;30 seconds;Limitations    Active Hamstring Stretch Limitations with strap    Hip Flexor Stretch Left;2 reps      Knee/Hip Exercises: Aerobic   Nustep L3 x 6 minutes      Knee/Hip Exercises: Standing   Heel Raises Right;Left;15 reps    Hip Abduction AROM;Stengthening;10 reps      Knee/Hip Exercises: Seated   Other Seated Knee/Hip Exercises rolling physioball -green foward and back for truck flexion, pt instructed in core activation   10 x   Sit to Sand 10 reps;with UE support      Knee/Hip Exercises: Supine   Bridges Limitations posterior pelvic tilt x 15 holding 5 seconds, bridges x 3 reps  (bridges increased pain in low back)      Knee/Hip Exercises: Sidelying   Hip ABduction Strengthening;Both;15 reps    Hip ABduction Limitations increased pain when lifting L LE    Clams x15    Other Sidelying Knee/Hip Exercises reverse clams 15 each LE      Knee/Hip Exercises: Prone   Hamstring Curl 15 reps;2 seconds;Limitations;Other (comment)    Hamstring Curl Limitations working on eccentric control    Hip Extension Strengthening;Both;3 sets;5 reps    Hip Extension Limitations instructions to prevent compentsation from his low back during lifting                       PT Long Term Goals - 02/14/20 0905      PT LONG TERM GOAL #1   Title Patient will demonstrate/report pain at worst less than or equal to 2/10 to facilitate minimal limitation in daily activity secondary to pain symptoms.    Status On-going      PT LONG TERM GOAL #2   Title Patient will demonstrate independent use of home exercise program to facilitate ability to maintain/progress functional gains from skilled physical therapy services.    Status On-going      PT LONG TERM GOAL #3   Title Pt. will demonstrate community distance ambulation >500 ft s deviation /symptoms for usual daily actiivty at [facility].    Status On-going      PT LONG TERM GOAL #4   Title Pt. will demonstrate Lt hip AROM equal to Rt to facilitate self care/dressing.    Status On-going      PT LONG TERM GOAL #5   Title Pt. will demonstrate bilateral LE MMT 5/5 to facilitate usual daily activity, work return at [employer].    Status On-going      PT LONG TERM GOAL #6   Title Pt. will demonstrate/report ability to return to work at [employer].    Status On-going                 Plan - 02/14/20 1696    Clinical Impression Statement Pt tolerating exercises well today with mild increase in pain noted during abduction exercises. Pt reporting once he starts the pain gets better. No hip popping noted during session. Focusing on core  activation and bilateral hip strengthening for stabilization. Continue skilled PT as pt tolerates.    Personal Factors and Comorbidities Comorbidity 3+    Comorbidities 2014 R THA, 2018 L THA, decompressive lumbar laminectomy 06/2019, Cauda Equina Syndrome 07/2019, psoas abcess history    Examination-Activity Limitations Sit;Squat;Stairs;Stand;Transfers;Hygiene/Grooming;Dressing    Examination-Participation  Restrictions Community Activity;Interpersonal Relationship;Other    Stability/Clinical Decision Making Evolving/Moderate complexity    PT Duration 6 weeks    PT Treatment/Interventions ADLs/Self Care Home Management;Electrical Stimulation;Iontophoresis 4mg /ml Dexamethasone;Cryotherapy;Moist Heat;Traction;Balance training;Therapeutic exercise;Therapeutic activities;Functional mobility training;Stair training;Gait training;Ultrasound;Neuromuscular re-education;Patient/family education;Manual techniques;Taping;Joint Manipulations;Passive range of motion;Dry needling;Spinal Manipulations    PT Next Visit Plan Reassess HEP use, hip strengthening, balance, ER range improvement for dressing.    PT Home Exercise Plan [facility]    Consulted and Agree with Plan of Care Patient           Patient will benefit from skilled therapeutic intervention in order to improve the following deficits and impairments:  Abnormal gait, Decreased endurance, Decreased skin integrity, Hypomobility, Decreased activity tolerance, Decreased strength, Pain, Increased muscle spasms, Difficulty walking, Decreased mobility, Decreased balance, Decreased range of motion, Impaired flexibility, Decreased coordination  Visit Diagnosis: Pain in left hip  Muscle weakness (generalized)  Difficulty in walking, not elsewhere classified     Problem List Patient Active Problem List   Diagnosis Date Noted  . Adjustment disorder with depressed mood 11/19/2019  . Left hip pain 10/09/2019  . Blurry vision, left eye 10/09/2019  .  Infected pressure ulcer 08/20/2019  . Hyponatremia 08/20/2019  . Pressure injury of skin 08/02/2019  . Lumbar discitis 07/30/2019  . S/P lumbar laminectomy 07/25/2019  . H/O MSSA epidural abscess, L2-L5 07/25/2019  . Anemia 07/18/2019  . Obesity (BMI 35.0-39.9 without comorbidity) 07/18/2019  . Constipation 07/18/2019  . Elevated blood sugar 04/01/2017  . Avascular necrosis of bone of hip, left ([place]) 10/12/2016  . Alcohol abuse, in remission 10/12/2016  . S/P total hip arthroplasty 10/12/2016    Oretha Caprice, PT, MPT 02/14/2020, 9:13 AM  [facility] Physical Therapy [address] [place], [place], [address] Phone: [phone]   Fax:  [phone]  Name: [name] MRN: [id number] Date of Birth: 10-28-1965

## 2020-02-15 ENCOUNTER — Ambulatory Visit (INDEPENDENT_AMBULATORY_CARE_PROVIDER_SITE_OTHER): Payer: BC Managed Care – PPO | Admitting: Rehabilitative and Restorative Service Providers"

## 2020-02-15 ENCOUNTER — Encounter: Payer: Self-pay | Admitting: Rehabilitative and Restorative Service Providers"

## 2020-02-15 DIAGNOSIS — R262 Difficulty in walking, not elsewhere classified: Secondary | ICD-10-CM

## 2020-02-15 DIAGNOSIS — M25552 Pain in left hip: Secondary | ICD-10-CM | POA: Diagnosis not present

## 2020-02-15 DIAGNOSIS — M6281 Muscle weakness (generalized): Secondary | ICD-10-CM | POA: Diagnosis not present

## 2020-02-15 NOTE — Therapy (Signed)
[facility] Physical Therapy [address] [place], [place], [address] Phone: [phone]   Fax:  [phone]  Physical Therapy Treatment  Patient Details  Name: [name] MRN: [id number] Date of Birth: 03/05/1966 Referring Provider (PT): Dr. Durward Fortes   Encounter Date: 02/15/2020   PT End of Session - 02/15/20 1104    Visit Number 3    Number of Visits 12    Date for PT Re-Evaluation 03/20/20    PT Start Time 1058    PT Stop Time 1138    PT Time Calculation (min) 40 min    Activity Tolerance Patient tolerated treatment well    Behavior During Therapy [facility] for tasks assessed/performed           Past Medical History:  Diagnosis Date  . Cauda equina syndrome ([place]) 07/31/2019  . Cerebral septic emboli ([place]) 07/30/2019  . History of chicken pox   . Medical history non-contributory     Past Surgical History:  Procedure Laterality Date  . CARPAL TUNNEL RELEASE Bilateral 2009  . CATARACT EXTRACTION W/PHACO Left 12/17/2019   Procedure: CATARACT EXTRACTION PHACO AND INTRAOCULAR LENS PLACEMENT (IOC) LEFT;  Surgeon: Eulogio Bear, MD;  Location: [place];  Service: Ophthalmology;  Laterality: Left;  3.61 0:26.9  . COLONOSCOPY W/ POLYPECTOMY    . JOINT REPLACEMENT    . LACERATION REPAIR Right ~ 2012   leg  . LUMBAR LAMINECTOMY/DECOMPRESSION MICRODISCECTOMY N/A 07/24/2019   Procedure: Decompressive Lumbar Laminectomy Lumbar three-four Lumbar four-five for Epidural Abscess;  Surgeon: Kary Kos, MD;  Location: [place];  Service: Neurosurgery;  Laterality: N/A;  . TOTAL HIP ARTHROPLASTY Right 01/11/2013   Procedure: TOTAL HIP ARTHROPLASTY;  Surgeon: Garald Balding, MD;  Location: [place];  Service: Orthopedics;  Laterality: Right;  . TOTAL HIP ARTHROPLASTY Left 10/12/2016   Procedure: TOTAL HIP ARTHROPLASTY;  Surgeon: Garald Balding, MD;  Location: [place];  Service: Orthopedics;  Laterality: Left;    There were no vitals filed for this visit.    Subjective Assessment - 02/15/20 1101    Subjective Pt. stated some burning feeling after leaving last visit.  Pt. stated feeling 2-3/10 or so.    Pertinent History Last MD note: Patient presents today for left hip pain. He said that in the last 2-22months he has noticed that if he bends over and lifts his leg to put on socks his left hip will pop and immediately very painful. He said that his hip stays sore. He had his left hip replaced with Dr.Whitfield in 2018. He said that he has lost a lot of weight and feels like that has caused his hip to start popping. He takes Oxycodone as prescribed by his PCP.  He denies any fever shakes or chills.    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 Yes    Pain Score 3     Pain Location Hip    Pain Orientation Left    Pain Descriptors / Indicators Burning;Aching    Pain Type Chronic pain    Pain Onset More than a month ago    Pain Frequency Intermittent                             [facility] Adult PT Treatment/Exercise - 02/15/20 0001      Neuro Re-ed    Neuro Re-ed Details  tandem stance 30  sec x 4 bilateral, wobble board fwd/back, side to side 30 x each way, instruction for heel raise/toe lift at home      Exercises   Other Exercises  Review of HEP techniques      Knee/Hip Exercises: Aerobic   Nustep lvl 5 10 mins      Knee/Hip Exercises: Standing   Lateral Step Up 10 reps;Step Height: 4";Both;3 sets      Knee/Hip Exercises: Seated   Sit to Sand 15 reps;without UE support   21 inch height                      PT Long Term Goals - 02/14/20 0905      PT LONG TERM GOAL #1   Title Patient will demonstrate/report pain at worst less than or equal to 2/10 to facilitate minimal limitation in daily activity secondary to pain symptoms.    Status On-going      PT LONG TERM GOAL #2   Title Patient will demonstrate independent use of  home exercise program to facilitate ability to maintain/progress functional gains from skilled physical therapy services.    Status On-going      PT LONG TERM GOAL #3   Title Pt. will demonstrate community distance ambulation >500 ft s deviation /symptoms for usual daily actiivty at [facility].    Status On-going      PT LONG TERM GOAL #4   Title Pt. will demonstrate Lt hip AROM equal to Rt to facilitate self care/dressing.    Status On-going      PT LONG TERM GOAL #5   Title Pt. will demonstrate bilateral LE MMT 5/5 to facilitate usual daily activity, work return at [employer].    Status On-going      PT LONG TERM GOAL #6   Title Pt. will demonstrate/report ability to return to work at [employer].    Status On-going                 Plan - 02/15/20 1129    Clinical Impression Statement Indications of improvement in pain symptoms as well as reduced frequency of popping complaints.  Fair performance on movement coordination intervention, indicating necessity for improvements.  Continued skilled PT services indicated.    Personal Factors and Comorbidities Comorbidity 3+    Comorbidities 2014 R THA, 2018 L THA, decompressive lumbar laminectomy 06/2019, Cauda Equina Syndrome 07/2019, psoas abcess history    Examination-Activity Limitations Sit;Squat;Stairs;Stand;Transfers;Hygiene/Grooming;Dressing    Examination-Participation Restrictions Community Activity;Interpersonal Relationship;Other    Stability/Clinical Decision Making Evolving/Moderate complexity    PT Duration 6 weeks    PT Treatment/Interventions ADLs/Self Care Home Management;Electrical Stimulation;Iontophoresis 4mg /ml Dexamethasone;Cryotherapy;Moist Heat;Traction;Balance training;Therapeutic exercise;Therapeutic activities;Functional mobility training;Stair training;Gait training;Ultrasound;Neuromuscular re-education;Patient/family education;Manual techniques;Taping;Joint Manipulations;Passive range of motion;Dry needling;Spinal  Manipulations    PT Next Visit Plan Hip strength, mobility, complaint and non complaint surface balance.    PT Home Exercise Plan [facility]    Consulted and Agree with Plan of Care Patient           Patient will benefit from skilled therapeutic intervention in order to improve the following deficits and impairments:  Abnormal gait, Decreased endurance, Decreased skin integrity, Hypomobility, Decreased activity tolerance, Decreased strength, Pain, Increased muscle spasms, Difficulty walking, Decreased mobility, Decreased balance, Decreased range of motion, Impaired flexibility, Decreased coordination  Visit Diagnosis: Pain in left hip  Muscle weakness (generalized)  Difficulty in walking, not elsewhere classified     Problem List Patient Active Problem List   Diagnosis Date Noted  .  Adjustment disorder with depressed mood 11/19/2019  . Left hip pain 10/09/2019  . Blurry vision, left eye 10/09/2019  . Infected pressure ulcer 08/20/2019  . Hyponatremia 08/20/2019  . Pressure injury of skin 08/02/2019  . Lumbar discitis 07/30/2019  . S/P lumbar laminectomy 07/25/2019  . H/O MSSA epidural abscess, L2-L5 07/25/2019  . Anemia 07/18/2019  . Obesity (BMI 35.0-39.9 without comorbidity) 07/18/2019  . Constipation 07/18/2019  . Elevated blood sugar 04/01/2017  . Avascular necrosis of bone of hip, left ([place]) 10/12/2016  . Alcohol abuse, in remission 10/12/2016  . S/P total hip arthroplasty 10/12/2016   Scot Jun, PT, DPT, OCS, ATC 02/15/20  11:36 AM    [facility] Physical Therapy [address] [place], [place], [address] Phone: [phone]   Fax:  [phone]  Name: [name] MRN: [id number] Date of Birth: 07/10/1966

## 2020-02-20 ENCOUNTER — Other Ambulatory Visit: Payer: Self-pay | Admitting: Family Medicine

## 2020-02-21 NOTE — Telephone Encounter (Signed)
Last OV: 02/05/20  Last refill: 11/07/19 #60 0 refills  No future visits scheduled

## 2020-02-25 ENCOUNTER — Other Ambulatory Visit: Payer: Self-pay

## 2020-02-25 ENCOUNTER — Ambulatory Visit (INDEPENDENT_AMBULATORY_CARE_PROVIDER_SITE_OTHER): Payer: BC Managed Care – PPO | Admitting: Physical Therapy

## 2020-02-25 ENCOUNTER — Encounter: Payer: Self-pay | Admitting: Physical Therapy

## 2020-02-25 DIAGNOSIS — M25552 Pain in left hip: Secondary | ICD-10-CM

## 2020-02-25 DIAGNOSIS — M6281 Muscle weakness (generalized): Secondary | ICD-10-CM | POA: Diagnosis not present

## 2020-02-25 DIAGNOSIS — R262 Difficulty in walking, not elsewhere classified: Secondary | ICD-10-CM | POA: Diagnosis not present

## 2020-02-25 NOTE — Therapy (Signed)
[facility] Physical Therapy [address] [place], [place], [address] Phone: [phone]   Fax:  [phone]  Physical Therapy Treatment  Patient Details  Name: [name] MRN: [id number] Date of Birth: 12/15/1965 Referring Provider (PT): Dr. Durward Fortes   Encounter Date: 02/25/2020   PT End of Session - 02/25/20 1151    Visit Number 4    Number of Visits 12    Date for PT Re-Evaluation 03/20/20    PT Start Time 1100    PT Stop Time 1143    PT Time Calculation (min) 43 min    Activity Tolerance Patient tolerated treatment well    Behavior During Therapy [facility] for tasks assessed/performed           Past Medical History:  Diagnosis Date  . Cauda equina syndrome ([place]) 07/31/2019  . Cerebral septic emboli ([place]) 07/30/2019  . History of chicken pox   . Medical history non-contributory     Past Surgical History:  Procedure Laterality Date  . CARPAL TUNNEL RELEASE Bilateral 2009  . CATARACT EXTRACTION W/PHACO Left 12/17/2019   Procedure: CATARACT EXTRACTION PHACO AND INTRAOCULAR LENS PLACEMENT (IOC) LEFT;  Surgeon: Eulogio Bear, MD;  Location: [place];  Service: Ophthalmology;  Laterality: Left;  3.61 0:26.9  . COLONOSCOPY W/ POLYPECTOMY    . JOINT REPLACEMENT    . LACERATION REPAIR Right ~ 2012   leg  . LUMBAR LAMINECTOMY/DECOMPRESSION MICRODISCECTOMY N/A 07/24/2019   Procedure: Decompressive Lumbar Laminectomy Lumbar three-four Lumbar four-five for Epidural Abscess;  Surgeon: Kary Kos, MD;  Location: [place];  Service: Neurosurgery;  Laterality: N/A;  . TOTAL HIP ARTHROPLASTY Right 01/11/2013   Procedure: TOTAL HIP ARTHROPLASTY;  Surgeon: Garald Balding, MD;  Location: [place];  Service: Orthopedics;  Laterality: Right;  . TOTAL HIP ARTHROPLASTY Left 10/12/2016   Procedure: TOTAL HIP ARTHROPLASTY;  Surgeon: Garald Balding, MD;  Location: [place];  Service: Orthopedics;  Laterality: Left;    There were no vitals filed for this visit.    Subjective Assessment - 02/25/20 1136    Subjective Pt arriving reporting no pain upon arrival. Pt reporting walking all day Saturday with no problem at a race only fatigue reproted. Pt stated he was standing with his L LE foot propped up on a bar ledge and while swinging his leg back and forth it "popped" again. Pt stated it hasn't popped in 2 weeks before this event.    Pertinent History Last MD note: Patient presents today for left hip pain. He said that in the last 2-49months he has noticed that if he bends over and lifts his leg to put on socks his left hip will pop and immediately very painful. He said that his hip stays sore. He had his left hip replaced with Dr.Whitfield in 2018. He said that he has lost a lot of weight and feels like that has caused his hip to start popping. He takes Oxycodone as prescribed by his PCP.  He denies any fever shakes or chills.    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 No/denies    Pain Onset More than a month ago                             [facility] Adult PT Treatment/Exercise - 02/25/20 0001      Neuro Re-ed    Neuro Re-ed Details  Airex, SLS and tandum stance with intermittent UE support      Knee/Hip Exercises: Aerobic   Nustep lvl 5 10 mins      Knee/Hip Exercises: Standing   Lateral Step Up Both;3 sets;10 reps;Hand Hold: 2;Step Height: 4"    Lateral Step Up Limitations (up, up, out, out) with UE support    Other Standing Knee Exercises Airex mat: SLS with toe touch on opposite LE, intermittent UE support holding 1 minute each LE.       Knee/Hip Exercises: Seated   Sit to Sand 15 reps;without UE support   arm chair height                      PT Long Term Goals - 02/25/20 1156      PT LONG TERM GOAL #1   Title Patient will demonstrate/report pain at worst less than or equal to 2/10 to facilitate minimal limitation in daily  activity secondary to pain symptoms.    Status On-going      PT LONG TERM GOAL #2   Title Patient will demonstrate independent use of home exercise program to facilitate ability to maintain/progress functional gains from skilled physical therapy services.    Status On-going      PT LONG TERM GOAL #3   Title Pt. will demonstrate community distance ambulation >500 ft s deviation /symptoms for usual daily actiivty at [facility].    Status On-going      PT LONG TERM GOAL #4   Title Pt. will demonstrate Lt hip AROM equal to Rt to facilitate self care/dressing.    Status On-going      PT LONG TERM GOAL #5   Title Pt. will demonstrate bilateral LE MMT 5/5 to facilitate usual daily activity, work return at [employer].    Status On-going                 Plan - 02/25/20 1152    Clinical Impression Statement Pt reporting new incident of hip "popping" on Friday night while standing and IR and ER the left hip while propped on bar ledge. Pt reporting fatigue toward end of session. Tolerating all exercises well. Pt did report that he has been able to lift his L LE and pull toward his chest without any popping noted. Pt still reporting caution with putting on shoes and sock and with squatting. Continue skilled PT toward goals set.    Personal Factors and Comorbidities Comorbidity 3+    Comorbidities 2014 R THA, 2018 L THA, decompressive lumbar laminectomy 06/2019, Cauda Equina Syndrome 07/2019, psoas abcess history    Examination-Activity Limitations Sit;Squat;Stairs;Stand;Transfers;Hygiene/Grooming;Dressing    Examination-Participation Restrictions Community Activity;Interpersonal Relationship;Other    Stability/Clinical Decision Making Evolving/Moderate complexity    PT Duration 6 weeks    PT Treatment/Interventions ADLs/Self Care Home Management;Electrical Stimulation;Iontophoresis 4mg /ml Dexamethasone;Cryotherapy;Moist Heat;Traction;Balance training;Therapeutic exercise;Therapeutic activities;Functional  mobility training;Stair training;Gait training;Ultrasound;Neuromuscular re-education;Patient/family education;Manual techniques;Taping;Joint Manipulations;Passive range of motion;Dry needling;Spinal Manipulations    PT Next Visit Plan Hip strength, mobility, complaint and non complaint surface balance.    PT Home Exercise Plan [facility]    Consulted and Agree with Plan of Care Patient           Patient will benefit from skilled therapeutic intervention in order to improve the following deficits and impairments:  Abnormal gait, Decreased endurance, Decreased skin integrity, Hypomobility, Decreased activity tolerance, Decreased strength, Pain, Increased muscle spasms, Difficulty walking, Decreased mobility, Decreased balance, Decreased range of motion, Impaired flexibility, Decreased coordination  Visit  Diagnosis: Pain in left hip  Muscle weakness (generalized)  Difficulty in walking, not elsewhere classified     Problem List Patient Active Problem List   Diagnosis Date Noted  . Adjustment disorder with depressed mood 11/19/2019  . Left hip pain 10/09/2019  . Blurry vision, left eye 10/09/2019  . Infected pressure ulcer 08/20/2019  . Hyponatremia 08/20/2019  . Pressure injury of skin 08/02/2019  . Lumbar discitis 07/30/2019  . S/P lumbar laminectomy 07/25/2019  . H/O MSSA epidural abscess, L2-L5 07/25/2019  . Anemia 07/18/2019  . Obesity (BMI 35.0-39.9 without comorbidity) 07/18/2019  . Constipation 07/18/2019  . Elevated blood sugar 04/01/2017  . Avascular necrosis of bone of hip, left ([place]) 10/12/2016  . Alcohol abuse, in remission 10/12/2016  . S/P total hip arthroplasty 10/12/2016    Oretha Caprice, PT, MPT 02/25/2020, 12:08 PM  [facility] Physical Therapy [address] [place], [place], [address] Phone: [phone]   Fax:  [phone]  Name: [name] MRN: [id number] Date of Birth: June 05, 1966

## 2020-02-29 ENCOUNTER — Other Ambulatory Visit: Payer: Self-pay

## 2020-02-29 ENCOUNTER — Other Ambulatory Visit: Payer: Self-pay | Admitting: Family Medicine

## 2020-02-29 ENCOUNTER — Ambulatory Visit (INDEPENDENT_AMBULATORY_CARE_PROVIDER_SITE_OTHER): Payer: BC Managed Care – PPO | Admitting: Rehabilitative and Restorative Service Providers"

## 2020-02-29 ENCOUNTER — Encounter: Payer: Self-pay | Admitting: Rehabilitative and Restorative Service Providers"

## 2020-02-29 DIAGNOSIS — R262 Difficulty in walking, not elsewhere classified: Secondary | ICD-10-CM | POA: Diagnosis not present

## 2020-02-29 DIAGNOSIS — M6281 Muscle weakness (generalized): Secondary | ICD-10-CM

## 2020-02-29 DIAGNOSIS — M25552 Pain in left hip: Secondary | ICD-10-CM | POA: Diagnosis not present

## 2020-02-29 DIAGNOSIS — F4321 Adjustment disorder with depressed mood: Secondary | ICD-10-CM

## 2020-02-29 DIAGNOSIS — H2511 Age-related nuclear cataract, right eye: Secondary | ICD-10-CM | POA: Diagnosis not present

## 2020-02-29 NOTE — Therapy (Signed)
[facility] Physical Therapy [address] [place], [place], [address] Phone: [phone]   Fax:  [phone]  Physical Therapy Treatment  Patient Details  Name: [name] MRN: [id number] Date of Birth: 05/08/1966 Referring Provider (PT): Dr. Durward Fortes   Encounter Date: 02/29/2020   PT End of Session - 02/29/20 1536    Visit Number 5    Number of Visits 12    Date for PT Re-Evaluation 03/20/20    PT Start Time 9528    PT Stop Time 1601    PT Time Calculation (min) 38 min    Activity Tolerance Patient tolerated treatment well    Behavior During Therapy [facility] for tasks assessed/performed           Past Medical History:  Diagnosis Date  . Cauda equina syndrome ([place]) 07/31/2019  . Cerebral septic emboli ([place]) 07/30/2019  . History of chicken pox   . Medical history non-contributory     Past Surgical History:  Procedure Laterality Date  . CARPAL TUNNEL RELEASE Bilateral 2009  . CATARACT EXTRACTION W/PHACO Left 12/17/2019   Procedure: CATARACT EXTRACTION PHACO AND INTRAOCULAR LENS PLACEMENT (IOC) LEFT;  Surgeon: Eulogio Bear, MD;  Location: [place];  Service: Ophthalmology;  Laterality: Left;  3.61 0:26.9  . COLONOSCOPY W/ POLYPECTOMY    . JOINT REPLACEMENT    . LACERATION REPAIR Right ~ 2012   leg  . LUMBAR LAMINECTOMY/DECOMPRESSION MICRODISCECTOMY N/A 07/24/2019   Procedure: Decompressive Lumbar Laminectomy Lumbar three-four Lumbar four-five for Epidural Abscess;  Surgeon: Kary Kos, MD;  Location: [place];  Service: Neurosurgery;  Laterality: N/A;  . TOTAL HIP ARTHROPLASTY Right 01/11/2013   Procedure: TOTAL HIP ARTHROPLASTY;  Surgeon: Garald Balding, MD;  Location: [place];  Service: Orthopedics;  Laterality: Right;  . TOTAL HIP ARTHROPLASTY Left 10/12/2016   Procedure: TOTAL HIP ARTHROPLASTY;  Surgeon: Garald Balding, MD;  Location: [place];  Service: Orthopedics;  Laterality: Left;    There were no vitals filed for this visit.    Subjective Assessment - 02/29/20 1533    Subjective Pt. stated hip feels better overall.  Once instance of popping that created pain.  Pt. stated improved movement from hip for dressing.  Pt. stated back is hurting 2/10 or so today and sometimes more, noted c standing.    Pertinent History Last MD note: Patient presents today for left hip pain. He said that in the last 2-54months he has noticed that if he bends over and lifts his leg to put on socks his left hip will pop and immediately very painful. He said that his hip stays sore. He had his left hip replaced with Dr.Whitfield in 2018. He said that he has lost a lot of weight and feels like that has caused his hip to start popping. He takes Oxycodone as prescribed by his PCP.  He denies any fever shakes or chills.    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 No/denies    Pain Score 0-No pain    Pain Location Hip    Pain Orientation Left    Pain Descriptors / Indicators Aching    Pain Type Chronic pain    Pain Onset More than a month ago    Pain Frequency Occasional    Multiple Pain Sites Yes    Pain Score 2    Pain Location Back    Pain Descriptors / Indicators Aching  Pain Frequency Intermittent    Aggravating Factors  prolonged standing    Pain Relieving Factors rest              [facility] PT Assessment - 02/29/20 0001      AROM   Lumbar Flexion to mid shin c no pain indicated    Lumbar Extension 50% WFL c ERP noted in lumbar    Lumbar - Right Side Bend minimal movement in lumbar    Lumbar - Left Side Bend minimal movement in lumbar                         [facility] Adult PT Treatment/Exercise - 02/29/20 0001      Exercises   Exercises Lumbar (P)       Knee/Hip Exercises: Aerobic   Nustep lvl 6 8 mins                       PT Long Term Goals - 02/25/20 1156      PT LONG TERM GOAL #1   Title Patient will  demonstrate/report pain at worst less than or equal to 2/10 to facilitate minimal limitation in daily activity secondary to pain symptoms.    Status On-going      PT LONG TERM GOAL #2   Title Patient will demonstrate independent use of home exercise program to facilitate ability to maintain/progress functional gains from skilled physical therapy services.    Status On-going      PT LONG TERM GOAL #3   Title Pt. will demonstrate community distance ambulation >500 ft s deviation /symptoms for usual daily actiivty at [facility].    Status On-going      PT LONG TERM GOAL #4   Title Pt. will demonstrate Lt hip AROM equal to Rt to facilitate self care/dressing.    Status On-going      PT LONG TERM GOAL #5   Title Pt. will demonstrate bilateral LE MMT 5/5 to facilitate usual daily activity, work return at [employer].    Status On-going                 Plan - 02/29/20 1538    Clinical Impression Statement Pt. reported that hip pain seemed to be reduced.  Overall presentation today indicated low back pain c mobility deficits primary with continued hip strength/balance concerns to improve c skilled PT services.    Personal Factors and Comorbidities Comorbidity 3+    Comorbidities 2014 R THA, 2018 L THA, decompressive lumbar laminectomy 06/2019, Cauda Equina Syndrome 07/2019, psoas abcess history    Examination-Activity Limitations Sit;Squat;Stairs;Stand;Transfers;Hygiene/Grooming;Dressing    Examination-Participation Restrictions Community Activity;Interpersonal Relationship;Other    Stability/Clinical Decision Making Evolving/Moderate complexity    PT Duration 6 weeks    PT Treatment/Interventions ADLs/Self Care Home Management;Electrical Stimulation;Iontophoresis 4mg /ml Dexamethasone;Cryotherapy;Moist Heat;Traction;Balance training;Therapeutic exercise;Therapeutic activities;Functional mobility training;Stair training;Gait training;Ultrasound;Neuromuscular re-education;Patient/family education;Manual  techniques;Taping;Joint Manipulations;Passive range of motion;Dry needling;Spinal Manipulations    PT Next Visit Plan Lumbar/hip mobility, balance intervention    PT Home Exercise Plan [email or web address]    Consulted and Agree with Plan of Care Patient           Patient will benefit from skilled therapeutic intervention in order to improve the following deficits and impairments:  Abnormal gait, Decreased endurance, Decreased skin integrity, Hypomobility, Decreased activity tolerance, Decreased strength, Pain, Increased muscle spasms, Difficulty walking, Decreased mobility, Decreased balance, Decreased range of motion, Impaired flexibility, Decreased coordination  Visit Diagnosis: Pain in left hip  Muscle weakness (generalized)  Difficulty in walking, not elsewhere classified     Problem List Patient Active Problem List   Diagnosis Date Noted  . Adjustment disorder with depressed mood 11/19/2019  . Left hip pain 10/09/2019  . Blurry vision, left eye 10/09/2019  . Infected pressure ulcer 08/20/2019  . Hyponatremia 08/20/2019  . Pressure injury of skin 08/02/2019  . Lumbar discitis 07/30/2019  . S/P lumbar laminectomy 07/25/2019  . H/O MSSA epidural abscess, L2-L5 07/25/2019  . Anemia 07/18/2019  . Obesity (BMI 35.0-39.9 without comorbidity) 07/18/2019  . Constipation 07/18/2019  . Elevated blood sugar 04/01/2017  . Avascular necrosis of bone of hip, left ([place]) 10/12/2016  . Alcohol abuse, in remission 10/12/2016  . S/P total hip arthroplasty 10/12/2016    Scot Jun, PT, DPT, OCS, ATC 02/29/20  3:52 PM    [facility] [address] [place], [place], [address] Phone: [phone]   Fax:  [phone]  Name: DETRICH RAKESTRAW MRN: [id number] Date of Birth: 04/21/1966

## 2020-03-04 ENCOUNTER — Encounter: Payer: Self-pay | Admitting: Rehabilitative and Restorative Service Providers"

## 2020-03-04 ENCOUNTER — Ambulatory Visit: Payer: BC Managed Care – PPO | Admitting: Rehabilitative and Restorative Service Providers"

## 2020-03-04 ENCOUNTER — Other Ambulatory Visit: Payer: Self-pay

## 2020-03-04 DIAGNOSIS — R262 Difficulty in walking, not elsewhere classified: Secondary | ICD-10-CM

## 2020-03-04 DIAGNOSIS — M25552 Pain in left hip: Secondary | ICD-10-CM

## 2020-03-04 DIAGNOSIS — M6281 Muscle weakness (generalized): Secondary | ICD-10-CM

## 2020-03-04 NOTE — Therapy (Signed)
[facility] Physical Therapy [address] [place], [place], [address] Phone: [phone]   Fax:  [phone]  Physical Therapy Treatment  Patient Details  Name: [name] MRN: [id number] Date of Birth: Apr 07, 1966 Referring Provider (PT): Dr. Durward Fortes   Encounter Date: 03/04/2020   PT End of Session - 03/04/20 1145    Visit Number 6    Number of Visits 12    Date for PT Re-Evaluation 03/20/20    PT Start Time 5631    PT Stop Time 1224    PT Time Calculation (min) 39 min    Activity Tolerance Patient tolerated treatment well    Behavior During Therapy [facility] for tasks assessed/performed           Past Medical History:  Diagnosis Date  . Cauda equina syndrome ([place]) 07/31/2019  . Cerebral septic emboli ([place]) 07/30/2019  . History of chicken pox   . Medical history non-contributory     Past Surgical History:  Procedure Laterality Date  . CARPAL TUNNEL RELEASE Bilateral 2009  . CATARACT EXTRACTION W/PHACO Left 12/17/2019   Procedure: CATARACT EXTRACTION PHACO AND INTRAOCULAR LENS PLACEMENT (IOC) LEFT;  Surgeon: Eulogio Bear, MD;  Location: [place];  Service: Ophthalmology;  Laterality: Left;  3.61 0:26.9  . COLONOSCOPY W/ POLYPECTOMY    . JOINT REPLACEMENT    . LACERATION REPAIR Right ~ 2012   leg  . LUMBAR LAMINECTOMY/DECOMPRESSION MICRODISCECTOMY N/A 07/24/2019   Procedure: Decompressive Lumbar Laminectomy Lumbar three-four Lumbar four-five for Epidural Abscess;  Surgeon: Kary Kos, MD;  Location: [place];  Service: Neurosurgery;  Laterality: N/A;  . TOTAL HIP ARTHROPLASTY Right 01/11/2013   Procedure: TOTAL HIP ARTHROPLASTY;  Surgeon: Garald Balding, MD;  Location: [place];  Service: Orthopedics;  Laterality: Right;  . TOTAL HIP ARTHROPLASTY Left 10/12/2016   Procedure: TOTAL HIP ARTHROPLASTY;  Surgeon: Garald Balding, MD;  Location: [place];  Service: Orthopedics;  Laterality: Left;    There were no vitals filed for this visit.    Subjective Assessment - 03/04/20 1157    Subjective Pt. indicated feeling some hip complaints this morning but getting better now.  Pt. indicated feeling back was better when leaving last visit but had similar complaints overall in back.    Pertinent History Last MD note: Patient presents today for left hip pain. He said that in the last 2-89months he has noticed that if he bends over and lifts his leg to put on socks his left hip will pop and immediately very painful. He said that his hip stays sore. He had his left hip replaced with Dr.Whitfield in 2018. He said that he has lost a lot of weight and feels like that has caused his hip to start popping. He takes Oxycodone as prescribed by his PCP.  He denies any fever shakes or chills.    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 Yes    Pain Onset More than a month ago    Pain Score 2    Pain Location Back                             [facility] Adult PT Treatment/Exercise - 03/04/20 0001      Lumbar Exercises: Stretches   Lower Trunk Rotation 5 reps;Other (comment)   15 sec x 5 bilateral     Lumbar Exercises: Supine  Bridge 20 reps      Knee/Hip Exercises: Aerobic   Recumbent Bike Lvl 2 10 mins      Knee/Hip Exercises: Standing   Other Standing Knee Exercises standing hip abd into doorway hold 5 seconds x 10 bilateral      Knee/Hip Exercises: Seated   Other Seated Knee/Hip Exercises slr 2 x 10 bilateral    Sit to Sand 20 reps;without UE support   to touch 21 inch height                      PT Long Term Goals - 02/25/20 1156      PT LONG TERM GOAL #1   Title Patient will demonstrate/report pain at worst less than or equal to 2/10 to facilitate minimal limitation in daily activity secondary to pain symptoms.    Status On-going      PT LONG TERM GOAL #2   Title Patient will demonstrate independent use of home exercise  program to facilitate ability to maintain/progress functional gains from skilled physical therapy services.    Status On-going      PT LONG TERM GOAL #3   Title Pt. will demonstrate community distance ambulation >500 ft s deviation /symptoms for usual daily actiivty at [facility].    Status On-going      PT LONG TERM GOAL #4   Title Pt. will demonstrate Lt hip AROM equal to Rt to facilitate self care/dressing.    Status On-going      PT LONG TERM GOAL #5   Title Pt. will demonstrate bilateral LE MMT 5/5 to facilitate usual daily activity, work return at [employer].    Status On-going                 Plan - 03/04/20 1225    Clinical Impression Statement At this point, Pt. continued to show progression in mobility and symptoms c use of ther ex/HEP program at this time  Cues given to improve routine use and management of symptoms with HEP for home management.  Strengthening in hip/LE still needed to improve walking/standing.    Personal Factors and Comorbidities Comorbidity 3+    Comorbidities 2014 R THA, 2018 L THA, decompressive lumbar laminectomy 06/2019, Cauda Equina Syndrome 07/2019, psoas abcess history    Examination-Activity Limitations Sit;Squat;Stairs;Stand;Transfers;Hygiene/Grooming;Dressing    Examination-Participation Restrictions Community Activity;Interpersonal Relationship;Other    Stability/Clinical Decision Making Evolving/Moderate complexity    PT Duration 6 weeks    PT Treatment/Interventions ADLs/Self Care Home Management;Electrical Stimulation;Iontophoresis 4mg /ml Dexamethasone;Cryotherapy;Moist Heat;Traction;Balance training;Therapeutic exercise;Therapeutic activities;Functional mobility training;Stair training;Gait training;Ultrasound;Neuromuscular re-education;Patient/family education;Manual techniques;Taping;Joint Manipulations;Passive range of motion;Dry needling;Spinal Manipulations    PT Next Visit Plan Emphasis on HEP, strength/balance for bilateral LE.    PT Home  Exercise Plan [facility]    Consulted and Agree with Plan of Care Patient           Patient will benefit from skilled therapeutic intervention in order to improve the following deficits and impairments:  Abnormal gait, Decreased endurance, Decreased skin integrity, Hypomobility, Decreased activity tolerance, Decreased strength, Pain, Increased muscle spasms, Difficulty walking, Decreased mobility, Decreased balance, Decreased range of motion, Impaired flexibility, Decreased coordination  Visit Diagnosis: Pain in left hip  Muscle weakness (generalized)  Difficulty in walking, not elsewhere classified     Problem List Patient Active Problem List   Diagnosis Date Noted  . Adjustment disorder with depressed mood 11/19/2019  . Left hip pain 10/09/2019  . Blurry vision, left eye 10/09/2019  . Infected pressure ulcer  08/20/2019  . Hyponatremia 08/20/2019  . Pressure injury of skin 08/02/2019  . Lumbar discitis 07/30/2019  . S/P lumbar laminectomy 07/25/2019  . H/O MSSA epidural abscess, L2-L5 07/25/2019  . Anemia 07/18/2019  . Obesity (BMI 35.0-39.9 without comorbidity) 07/18/2019  . Constipation 07/18/2019  . Elevated blood sugar 04/01/2017  . Avascular necrosis of bone of hip, left ([place]) 10/12/2016  . Alcohol abuse, in remission 10/12/2016  . S/P total hip arthroplasty 10/12/2016    Girtha Rm 03/04/2020, 12:27 PM  [facility] Physical Therapy [address] [place], [place], [address] Phone: [phone]   Fax:  [phone]  Name: [name] MRN: [id number] Date of Birth: 08/12/1965

## 2020-03-07 ENCOUNTER — Telehealth: Payer: Self-pay

## 2020-03-07 ENCOUNTER — Other Ambulatory Visit: Payer: Self-pay

## 2020-03-07 ENCOUNTER — Ambulatory Visit (INDEPENDENT_AMBULATORY_CARE_PROVIDER_SITE_OTHER): Payer: BC Managed Care – PPO | Admitting: Physical Therapy

## 2020-03-07 DIAGNOSIS — Z9889 Other specified postprocedural states: Secondary | ICD-10-CM

## 2020-03-07 DIAGNOSIS — M25552 Pain in left hip: Secondary | ICD-10-CM

## 2020-03-07 DIAGNOSIS — R262 Difficulty in walking, not elsewhere classified: Secondary | ICD-10-CM | POA: Diagnosis not present

## 2020-03-07 DIAGNOSIS — M545 Low back pain, unspecified: Secondary | ICD-10-CM

## 2020-03-07 DIAGNOSIS — M6281 Muscle weakness (generalized): Secondary | ICD-10-CM | POA: Diagnosis not present

## 2020-03-07 NOTE — Telephone Encounter (Signed)
Pt called stating that the pharmacy let him know that they do not have Oxycodone 10 mg in stock and is requesting for alternative Rx...Marland Kitchen please advise

## 2020-03-07 NOTE — Telephone Encounter (Signed)
Please call the pharmacy to see what equivalent they have available and request that send a refill request for the alternative

## 2020-03-07 NOTE — Therapy (Signed)
[facility] Physical Therapy [address] [place], [place], [address] Phone: [phone]   Fax:  [phone]  Physical Therapy Treatment  Patient Details  Name: [name] MRN: [id number] Date of Birth: June 19, 1966 Referring Provider (PT): Dr. Durward Fortes   Encounter Date: 03/07/2020   PT End of Session - 03/07/20 1141    Visit Number 7    Number of Visits 12    Date for PT Re-Evaluation 03/20/20    PT Start Time 1104    PT Stop Time 1143    PT Time Calculation (min) 39 min    Activity Tolerance Patient tolerated treatment well    Behavior During Therapy [facility] for tasks assessed/performed           Past Medical History:  Diagnosis Date  . Cauda equina syndrome ([place]) 07/31/2019  . Cerebral septic emboli ([place]) 07/30/2019  . History of chicken pox   . Medical history non-contributory     Past Surgical History:  Procedure Laterality Date  . CARPAL TUNNEL RELEASE Bilateral 2009  . CATARACT EXTRACTION W/PHACO Left 12/17/2019   Procedure: CATARACT EXTRACTION PHACO AND INTRAOCULAR LENS PLACEMENT (IOC) LEFT;  Surgeon: Eulogio Bear, MD;  Location: [place];  Service: Ophthalmology;  Laterality: Left;  3.61 0:26.9  . COLONOSCOPY W/ POLYPECTOMY    . JOINT REPLACEMENT    . LACERATION REPAIR Right ~ 2012   leg  . LUMBAR LAMINECTOMY/DECOMPRESSION MICRODISCECTOMY N/A 07/24/2019   Procedure: Decompressive Lumbar Laminectomy Lumbar three-four Lumbar four-five for Epidural Abscess;  Surgeon: Kary Kos, MD;  Location: [place];  Service: Neurosurgery;  Laterality: N/A;  . TOTAL HIP ARTHROPLASTY Right 01/11/2013   Procedure: TOTAL HIP ARTHROPLASTY;  Surgeon: Garald Balding, MD;  Location: [place];  Service: Orthopedics;  Laterality: Right;  . TOTAL HIP ARTHROPLASTY Left 10/12/2016   Procedure: TOTAL HIP ARTHROPLASTY;  Surgeon: Garald Balding, MD;  Location: [place];  Service: Orthopedics;  Laterality: Left;    There were no vitals filed for this visit.    Subjective Assessment - 03/07/20 1132    Subjective Relays pain is ovarall better about 3/10 in his hip. It is not popping over the last week like it had been.    Pertinent History Last MD note: Patient presents today for left hip pain. He said that in the last 2-65months he has noticed that if he bends over and lifts his leg to put on socks his left hip will pop and immediately very painful. He said that his hip stays sore. He had his left hip replaced with Dr.Whitfield in 2018. He said that he has lost a lot of weight and feels like that has caused his hip to start popping. He takes Oxycodone as prescribed by his PCP.  He denies any fever shakes or chills.    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      Knee/Hip Exercises: Aerobic   Recumbent Bike L5 X 1 0 min  Knee/Hip Exercises: Machines for Strengthening   Cybex Leg Press SL 75 lbs 2X15 on both      Knee/Hip Exercises: Standing   Lateral Step Up Both;15 reps;Step Height: 6";Hand Hold: 0    Forward Step Up Both;15 reps;Step Height: 6";Hand Hold: 0    Functional Squat Limitations squats with UE support of TM rail 2X10    Other Standing Knee Exercises standing hip flexion marches, ext and 45 deg post-lateral kicks 2X10 bilat all with green band around ankles    Other Standing Knee Exercises sidestepping with green band around knees up/down length of countertop X 3 reps                       PT Long Term Goals - 02/25/20 1156      PT LONG TERM GOAL #1   Title Patient will demonstrate/report pain at worst less than  or equal to 2/10 to facilitate minimal limitation in daily activity secondary to pain symptoms.    Status On-going      PT LONG TERM GOAL #2   Title Patient will demonstrate independent use of home exercise program to facilitate ability to maintain/progress functional gains from skilled physical therapy services.    Status On-going      PT LONG TERM GOAL #3   Title Pt. will demonstrate community distance ambulation >500 ft s deviation /symptoms for usual daily actiivty at [facility].    Status On-going      PT LONG TERM GOAL #4   Title Pt. will demonstrate Lt hip AROM equal to Rt to facilitate self care/dressing.    Status On-going      PT LONG TERM GOAL #5   Title Pt. will demonstrate bilateral LE MMT 5/5 to facilitate usual daily activity, work return at [employer].    Status On-going                 Plan - 03/07/20 1142    Clinical Impression Statement Had less overall pain and improved actvity tolerance so able to progress strengthening and balance training without complaints. He does still have strength and balance deficits on Lt compared to Rt and will continue to benefit from PT.    Personal Factors and Comorbidities Comorbidity 3+    Comorbidities 2014 R THA, 2018 L THA, decompressive lumbar laminectomy 06/2019, Cauda Equina Syndrome 07/2019, psoas abcess history    Examination-Activity Limitations Sit;Squat;Stairs;Stand;Transfers;Hygiene/Grooming;Dressing    Examination-Participation Restrictions Community Activity;Interpersonal Relationship;Other    Stability/Clinical Decision Making Evolving/Moderate complexity    PT Duration 6 weeks    PT Treatment/Interventions ADLs/Self Care Home Management;Electrical Stimulation;Iontophoresis 4mg /ml Dexamethasone;Cryotherapy;Moist Heat;Traction;Balance training;Therapeutic exercise;Therapeutic activities;Functional mobility training;Stair training;Gait training;Ultrasound;Neuromuscular re-education;Patient/family education;Manual  techniques;Taping;Joint Manipulations;Passive range of motion;Dry needling;Spinal Manipulations    PT Next Visit Plan Emphasis on HEP, strength/balance for bilateral LE.    PT Home Exercise Plan [facility]    Consulted and Agree with Plan of Care Patient           Patient will benefit from skilled therapeutic intervention in order to improve the following deficits and impairments:  Abnormal gait, Decreased endurance, Decreased skin integrity, Hypomobility, Decreased activity tolerance, Decreased strength, Pain, Increased muscle spasms, Difficulty walking, Decreased mobility, Decreased balance, Decreased range of motion, Impaired flexibility, Decreased coordination  Visit Diagnosis: Pain in left hip  Muscle weakness (generalized)  Difficulty in walking, not elsewhere classified     Problem List Patient Active Problem List   Diagnosis Date Noted  . Adjustment disorder with depressed mood  11/19/2019  . Left hip pain 10/09/2019  . Blurry vision, left eye 10/09/2019  . Infected pressure ulcer 08/20/2019  . Hyponatremia 08/20/2019  . Pressure injury of skin 08/02/2019  . Lumbar discitis 07/30/2019  . S/P lumbar laminectomy 07/25/2019  . H/O MSSA epidural abscess, L2-L5 07/25/2019  . Anemia 07/18/2019  . Obesity (BMI 35.0-39.9 without comorbidity) 07/18/2019  . Constipation 07/18/2019  . Elevated blood sugar 04/01/2017  . Avascular necrosis of bone of hip, left ([place]) 10/12/2016  . Alcohol abuse, in remission 10/12/2016  . S/P total hip arthroplasty 10/12/2016    Silvestre Mesi 03/07/2020, 11:44 AM  [facility] Physical Therapy [address] [place], [place], [address] Phone: [phone]   Fax:  [phone]  Name: BILAL MANZER MRN: [id number] Date of Birth: 06-09-1966

## 2020-03-10 MED ORDER — OXYCODONE HCL 20 MG PO TABS: 10.0000 mg | ORAL_TABLET | Freq: Three times a day (TID) | ORAL | 0 refills | Status: DC | PRN

## 2020-03-10 NOTE — Telephone Encounter (Signed)
Pt aware.

## 2020-03-10 NOTE — Telephone Encounter (Signed)
One month supply of alternative sent to pharmacy - he will need to cut these pills in half.

## 2020-03-10 NOTE — Telephone Encounter (Signed)
Attempted to call pt and let him know of alternative medication sent to his pharmacy. No answer and his VM box is full.

## 2020-03-10 NOTE — Telephone Encounter (Signed)
Pharmacy states that they can't send a refill request for a narcotic. The alternatives they currently have are:  20mg  Oxycodone that are scored (enough in stock to fill for this month) Or Percocet w/ tylenol 10/325 mg

## 2020-03-10 NOTE — Addendum Note (Signed)
Addended by: Lesleigh Noe on: 03/10/2020 09:26 AM   Modules accepted: Orders

## 2020-03-11 ENCOUNTER — Ambulatory Visit (INDEPENDENT_AMBULATORY_CARE_PROVIDER_SITE_OTHER): Payer: BC Managed Care – PPO | Admitting: Rehabilitative and Restorative Service Providers"

## 2020-03-11 ENCOUNTER — Encounter: Payer: Self-pay | Admitting: Ophthalmology

## 2020-03-11 ENCOUNTER — Other Ambulatory Visit: Payer: Self-pay

## 2020-03-11 ENCOUNTER — Encounter: Payer: Self-pay | Admitting: Rehabilitative and Restorative Service Providers"

## 2020-03-11 DIAGNOSIS — R262 Difficulty in walking, not elsewhere classified: Secondary | ICD-10-CM

## 2020-03-11 DIAGNOSIS — M6281 Muscle weakness (generalized): Secondary | ICD-10-CM | POA: Diagnosis not present

## 2020-03-11 DIAGNOSIS — M25552 Pain in left hip: Secondary | ICD-10-CM

## 2020-03-11 NOTE — Therapy (Signed)
[facility] Physical Therapy [address] [place], [place], [address] Phone: [phone]   Fax:  [phone]  Physical Therapy Treatment  Patient Details  Name: [name] MRN: [id number] Date of Birth: 03/06/1966 Referring Provider (PT): Dr. Durward Fortes   Encounter Date: 03/11/2020   PT End of Session - 03/11/20 1110    Visit Number 8    Number of Visits 12    Date for PT Re-Evaluation 03/20/20    PT Start Time 1105    PT Stop Time 1144    PT Time Calculation (min) 39 min    Activity Tolerance Patient tolerated treatment well    Behavior During Therapy [facility] for tasks assessed/performed           Past Medical History:  Diagnosis Date  . Cauda equina syndrome ([place]) 07/31/2019  . Cerebral septic emboli ([place]) 07/30/2019  . History of chicken pox   . Medical history non-contributory     Past Surgical History:  Procedure Laterality Date  . CARPAL TUNNEL RELEASE Bilateral 2009  . CATARACT EXTRACTION W/PHACO Left 12/17/2019   Procedure: CATARACT EXTRACTION PHACO AND INTRAOCULAR LENS PLACEMENT (IOC) LEFT;  Surgeon: Eulogio Bear, MD;  Location: [place];  Service: Ophthalmology;  Laterality: Left;  3.61 0:26.9  . COLONOSCOPY W/ POLYPECTOMY    . JOINT REPLACEMENT    . LACERATION REPAIR Right ~ 2012   leg  . LUMBAR LAMINECTOMY/DECOMPRESSION MICRODISCECTOMY N/A 07/24/2019   Procedure: Decompressive Lumbar Laminectomy Lumbar three-four Lumbar four-five for Epidural Abscess;  Surgeon: Kary Kos, MD;  Location: [place];  Service: Neurosurgery;  Laterality: N/A;  . TOTAL HIP ARTHROPLASTY Right 01/11/2013   Procedure: TOTAL HIP ARTHROPLASTY;  Surgeon: Garald Balding, MD;  Location: [place];  Service: Orthopedics;  Laterality: Right;  . TOTAL HIP ARTHROPLASTY Left 10/12/2016   Procedure: TOTAL HIP ARTHROPLASTY;  Surgeon: Garald Balding, MD;  Location: [place];  Service: Orthopedics;  Laterality: Left;    There were no vitals filed for this visit.    Subjective Assessment - 03/11/20 1109    Subjective Pt. reported feeling back pain/stiffness today since waking up.  Pt. stated it happens off and on like this, insidious onset.    Pertinent History Last MD note: Patient presents today for left hip pain. He said that in the last 2-96months he has noticed that if he bends over and lifts his leg to put on socks his left hip will pop and immediately very painful. He said that his hip stays sore. He had his left hip replaced with Dr.Whitfield in 2018. He said that he has lost a lot of weight and feels like that has caused his hip to start popping. He takes Oxycodone as prescribed by his PCP.  He denies any fever shakes or chills.    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 Yes    Pain Score 5     Pain Location Back    Pain Orientation Lower    Pain Descriptors / Indicators Aching;Tightness    Pain Type Chronic pain    Pain Onset More than a month ago    Pain Frequency Intermittent    Aggravating Factors  insidious worsening this morning    Pain Relieving Factors some exercise movement  [facility] Adult PT Treatment/Exercise - 03/11/20 0001      Exercises   Other Exercises  Pt. education review of frequency of HEP for self management of symptoms, POC education (extension possible based off Pt. choice)      Lumbar Exercises: Stretches   Lower Trunk Rotation 5 reps;Other (comment)   15 sec x 5 bilateral     Lumbar Exercises: Aerobic   Recumbent Bike Lvl 3 10 mins      Lumbar Exercises: Standing   Other Standing Lumbar Exercises standing lumbar extension 3 x 5      Lumbar Exercises: Supine   Bridge Compliant;Other (comment)   3 x 10     Knee/Hip Exercises: Machines for Strengthening   Cybex Leg Press SL 75 lbs 2X15 on both      Knee/Hip Exercises: Standing   Other Standing Knee Exercises standing doorway abd 5  sec hold x 10 bilateral      Manual Therapy   Manual therapy comments cPA lumbar L2-L5 g2-g3                       PT Long Term Goals - 02/25/20 1156      PT LONG TERM GOAL #1   Title Patient will demonstrate/report pain at worst less than or equal to 2/10 to facilitate minimal limitation in daily activity secondary to pain symptoms.    Status On-going      PT LONG TERM GOAL #2   Title Patient will demonstrate independent use of home exercise program to facilitate ability to maintain/progress functional gains from skilled physical therapy services.    Status On-going      PT LONG TERM GOAL #3   Title Pt. will demonstrate community distance ambulation >500 ft s deviation /symptoms for usual daily actiivty at [facility].    Status On-going      PT LONG TERM GOAL #4   Title Pt. will demonstrate Lt hip AROM equal to Rt to facilitate self care/dressing.    Status On-going      PT LONG TERM GOAL #5   Title Pt. will demonstrate bilateral LE MMT 5/5 to facilitate usual daily activity, work return at [employer].    Status On-going                 Plan - 03/11/20 1123    Clinical Impression Statement Localized low back symptoms noted c hypomobility L3-L5 today c mild improvement in PAIVM assessment during course of manual intervention.  Limitation more noted from Rt lumbar mobility checks.  Hip continued to show improvement overall.    Personal Factors and Comorbidities Comorbidity 3+    Comorbidities 2014 R THA, 2018 L THA, decompressive lumbar laminectomy 06/2019, Cauda Equina Syndrome 07/2019, psoas abcess history    Examination-Activity Limitations Sit;Squat;Stairs;Stand;Transfers;Hygiene/Grooming;Dressing    Examination-Participation Restrictions Community Activity;Interpersonal Relationship;Other    Stability/Clinical Decision Making Evolving/Moderate complexity    PT Duration 6 weeks    PT Treatment/Interventions ADLs/Self Care Home Management;Electrical  Stimulation;Iontophoresis 4mg /ml Dexamethasone;Cryotherapy;Moist Heat;Traction;Balance training;Therapeutic exercise;Therapeutic activities;Functional mobility training;Stair training;Gait training;Ultrasound;Neuromuscular re-education;Patient/family education;Manual techniques;Taping;Joint Manipulations;Passive range of motion;Dry needling;Spinal Manipulations    PT Next Visit Plan Progress note next visit, evaluate for additional POC    PT Home Exercise Plan [facility]    Consulted and Agree with Plan of Care Patient           Patient will benefit from skilled therapeutic intervention in order to improve the following deficits and impairments:  Abnormal gait, Decreased endurance, Decreased  skin integrity, Hypomobility, Decreased activity tolerance, Decreased strength, Pain, Increased muscle spasms, Difficulty walking, Decreased mobility, Decreased balance, Decreased range of motion, Impaired flexibility, Decreased coordination  Visit Diagnosis: Pain in left hip  Muscle weakness (generalized)  Difficulty in walking, not elsewhere classified     Problem List Patient Active Problem List   Diagnosis Date Noted  . Adjustment disorder with depressed mood 11/19/2019  . Left hip pain 10/09/2019  . Blurry vision, left eye 10/09/2019  . Infected pressure ulcer 08/20/2019  . Hyponatremia 08/20/2019  . Pressure injury of skin 08/02/2019  . Lumbar discitis 07/30/2019  . S/P lumbar laminectomy 07/25/2019  . H/O MSSA epidural abscess, L2-L5 07/25/2019  . Anemia 07/18/2019  . Obesity (BMI 35.0-39.9 without comorbidity) 07/18/2019  . Constipation 07/18/2019  . Elevated blood sugar 04/01/2017  . Avascular necrosis of bone of hip, left ([place]) 10/12/2016  . Alcohol abuse, in remission 10/12/2016  . S/P total hip arthroplasty 10/12/2016    Scot Jun, PT, DPT, OCS, ATC 03/11/20  11:39 AM    [facility] Physical Therapy [address] [place], [place], [address] Phone:  [phone]   Fax:  [phone]  Name: [name] MRN: [id number] Date of Birth: October 05, 1965

## 2020-03-13 ENCOUNTER — Other Ambulatory Visit
Admission: RE | Admit: 2020-03-13 | Discharge: 2020-03-13 | Disposition: A | Discharge status: Home / Self Care | Payer: BC Managed Care – PPO | Source: Ambulatory Visit | Attending: Ophthalmology | Admitting: Ophthalmology

## 2020-03-13 ENCOUNTER — Encounter: Payer: Self-pay | Admitting: Rehabilitative and Restorative Service Providers"

## 2020-03-13 ENCOUNTER — Ambulatory Visit (INDEPENDENT_AMBULATORY_CARE_PROVIDER_SITE_OTHER): Payer: BC Managed Care – PPO | Admitting: Rehabilitative and Restorative Service Providers"

## 2020-03-13 ENCOUNTER — Other Ambulatory Visit: Payer: Self-pay

## 2020-03-13 DIAGNOSIS — Z01812 Encounter for preprocedural laboratory examination: Secondary | ICD-10-CM | POA: Insufficient documentation

## 2020-03-13 DIAGNOSIS — M6281 Muscle weakness (generalized): Secondary | ICD-10-CM

## 2020-03-13 DIAGNOSIS — R262 Difficulty in walking, not elsewhere classified: Secondary | ICD-10-CM | POA: Diagnosis not present

## 2020-03-13 DIAGNOSIS — M25552 Pain in left hip: Secondary | ICD-10-CM | POA: Diagnosis not present

## 2020-03-13 DIAGNOSIS — Z20822 Contact with and (suspected) exposure to covid-19: Secondary | ICD-10-CM | POA: Diagnosis not present

## 2020-03-13 NOTE — Therapy (Addendum)
[facility] [address] [place], [place], [address] Phone: [phone]   Fax:  [phone]  Physical Therapy Treatment  Patient Details  Name: [name] MRN: [id number] Date of Birth: May 24, 1966 Referring Provider (PT): Dr. Durward Fortes   Encounter Date: 03/13/2020   PT End of Session - 03/13/20 1107    Visit Number 9    Number of Visits 12    Date for PT Re-Evaluation 03/20/20    Progress Note Due on Visit 10    PT Start Time 1102    PT Stop Time 1141    PT Time Calculation (min) 39 min    Activity Tolerance Patient tolerated treatment well    Behavior During Therapy [facility] for tasks assessed/performed           Past Medical History:  Diagnosis Date  . Cauda equina syndrome ([place]) 07/31/2019  . Cerebral septic emboli ([place]) 07/30/2019  . History of chicken pox   . Medical history non-contributory     Past Surgical History:  Procedure Laterality Date  . CARPAL TUNNEL RELEASE Bilateral 2009  . CATARACT EXTRACTION W/PHACO Left 12/17/2019   Procedure: CATARACT EXTRACTION PHACO AND INTRAOCULAR LENS PLACEMENT (IOC) LEFT;  Surgeon: Eulogio Bear, MD;  Location: [place];  Service: Ophthalmology;  Laterality: Left;  3.61 0:26.9  . COLONOSCOPY W/ POLYPECTOMY    . JOINT REPLACEMENT    . LACERATION REPAIR Right ~ 2012   leg  . LUMBAR LAMINECTOMY/DECOMPRESSION MICRODISCECTOMY N/A 07/24/2019   Procedure: Decompressive Lumbar Laminectomy Lumbar three-four Lumbar four-five for Epidural Abscess;  Surgeon: Kary Kos, MD;  Location: [place];  Service: Neurosurgery;  Laterality: N/A;  . TOTAL HIP ARTHROPLASTY Right 01/11/2013   Procedure: TOTAL HIP ARTHROPLASTY;  Surgeon: Garald Balding, MD;  Location: [place];  Service: Orthopedics;  Laterality: Right;  . TOTAL HIP ARTHROPLASTY Left 10/12/2016   Procedure: TOTAL HIP ARTHROPLASTY;  Surgeon: Garald Balding, MD;  Location: [place];  Service: Orthopedics;  Laterality: Left;    There were no  vitals filed for this visit.   Subjective Assessment - 03/13/20 1107    Subjective Pt. indicated incident of pop when getting up off of ground.  First time in several weeks.  Pt. paused to recover and was able to get up and move around.  Pt. indicated back feeling improved some compared to last visit report.    Pertinent History Last MD note: Patient presents today for left hip pain. He said that in the last 2-66month he has noticed that if he bends over and lifts his leg to put on socks his left hip will pop and immediately very painful. He said that his hip stays sore. He had his left hip replaced with Dr.Whitfield in 2018. He said that he has lost a lot of weight and feels like that has caused his hip to start popping. He takes Oxycodone as prescribed by his PCP.  He denies any fever shakes or chills.    Limitations Standing;Walking;Other (comment)    Patient Stated Goals Reduce pain, return to work (Tax inspector    Currently in Pain? No/denies    Pain Score 0-No pain    Pain Onset More than a month ago    Pain Score 0                             [facility] Adult PT Treatment/Exercise - 03/13/20 0001      Neuro Re-ed  Neuro Re-ed Details  tandem ambulation on foam fwd/back 8 ft x 5 each way, lateral stepping 3 cones x 8 each bilateral, SLS c 3 cone taps occasional HHA x 10 bilateral      Lumbar Exercises: Aerobic   Recumbent Bike Lvl 4 10 mins      Knee/Hip Exercises: Machines for Strengthening   Cybex Leg Press SL 62 lbs x 10, 50 2 x 10, performed bilateral      Knee/Hip Exercises: Sidelying   Hip ABduction Strengthening;Both;2 sets;10 reps   slr                      PT Long Term Goals - 02/25/20 1156      PT LONG TERM GOAL #1   Title Patient will demonstrate/report pain at worst less than or equal to 2/10 to facilitate minimal limitation in daily activity secondary to pain symptoms.    Status On-going      PT LONG TERM GOAL #2   Title Patient will  demonstrate independent use of home exercise program to facilitate ability to maintain/progress functional gains from skilled physical therapy services.    Status On-going      PT LONG TERM GOAL #3   Title Pt. will demonstrate community distance ambulation >500 ft s deviation /symptoms for usual daily actiivty at [facility].    Status On-going      PT LONG TERM GOAL #4   Title Pt. will demonstrate Lt hip AROM equal to Rt to facilitate self care/dressing.    Status On-going      PT LONG TERM GOAL #5   Title Pt. will demonstrate bilateral LE MMT 5/5 to facilitate usual daily activity, work return at [employer].    Status On-going                 Plan - 03/13/20 1134    Clinical Impression Statement Reduced back pain complaints overall, evidence of lateral hip weakness and balance control deficits that contribute to decreased stability in ambulation.    Personal Factors and Comorbidities Comorbidity 3+    Comorbidities 2014 R THA, 2018 L THA, decompressive lumbar laminectomy 06/2019, Cauda Equina Syndrome 07/2019, psoas abcess history    Examination-Activity Limitations Sit;Squat;Stairs;Stand;Transfers;Hygiene/Grooming;Dressing    Examination-Participation Restrictions Community Activity;Interpersonal Relationship;Other    Stability/Clinical Decision Making Evolving/Moderate complexity    PT Duration 6 weeks    PT Treatment/Interventions ADLs/Self Care Home Management;Electrical Stimulation;Iontophoresis 68m/ml Dexamethasone;Cryotherapy;Moist Heat;Traction;Balance training;Therapeutic exercise;Therapeutic activities;Functional mobility training;Stair training;Gait training;Ultrasound;Neuromuscular re-education;Patient/family education;Manual techniques;Taping;Joint Manipulations;Passive range of motion;Dry needling;Spinal Manipulations    PT Next Visit Plan Indications to plan for continued POC per PT. report c strengthening focus/balance improvements.    PT Home Exercise Plan [facility]    Consulted and Agree with Plan of Care Patient           Patient will benefit from skilled therapeutic intervention in order to improve the following deficits and impairments:  Abnormal gait, Decreased endurance, Decreased skin integrity, Hypomobility, Decreased activity tolerance, Decreased strength, Pain, Increased muscle spasms, Difficulty walking, Decreased mobility, Decreased balance, Decreased range of motion, Impaired flexibility, Decreased coordination  Visit Diagnosis: Pain in left hip  Muscle weakness (generalized)  Difficulty in walking, not elsewhere classified     Problem List Patient Active Problem List   Diagnosis Date Noted  . Adjustment disorder with depressed mood 11/19/2019  . Left hip pain 10/09/2019  . Blurry vision, left eye 10/09/2019  . Infected pressure ulcer 08/20/2019  . Hyponatremia 08/20/2019  .  Pressure injury of skin 08/02/2019  . Lumbar discitis 07/30/2019  . S/P lumbar laminectomy 07/25/2019  . H/O MSSA epidural abscess, L2-L5 07/25/2019  . Anemia 07/18/2019  . Obesity (BMI 35.0-39.9 without comorbidity) 07/18/2019  . Constipation 07/18/2019  . Elevated blood sugar 04/01/2017  . Avascular necrosis of bone of hip, left ([place]) 10/12/2016  . Alcohol abuse, in remission 10/12/2016  . S/P total hip arthroplasty 10/12/2016    Scot Jun, PT, DPT, OCS, ATC 03/13/20  11:43 AM  PHYSICAL THERAPY DISCHARGE SUMMARY  Visits from Start of Care: 9  Current functional level related to goals / functional outcomes: See note   Remaining deficits: See note   Education / Equipment: HEP Plan: Patient agrees to discharge.  Patient goals were partially met. Patient is being discharged due to not returning since the last visit.  ?????    Scot Jun, PT, DPT, OCS, ATC 05/13/20  10:48 AM     [facility] [address] [place], [place], [address] Phone: [phone]   Fax:  [phone]  Name: [name] MRN: [id number] Date of Birth: [birth year]

## 2020-03-13 NOTE — Discharge Instructions (Signed)

## 2020-03-14 LAB — SARS CORONAVIRUS 2 (TAT 6-24 HRS): SARS Coronavirus 2: NEGATIVE

## 2020-03-17 ENCOUNTER — Encounter
Admission: RE | Disposition: A | Discharge status: Home / Self Care | Payer: Self-pay | Source: Ambulatory Visit | Attending: Ophthalmology

## 2020-03-17 ENCOUNTER — Ambulatory Visit: Payer: BC Managed Care – PPO | Admitting: Anesthesiology

## 2020-03-17 ENCOUNTER — Other Ambulatory Visit: Payer: Self-pay

## 2020-03-17 ENCOUNTER — Encounter: Payer: Self-pay | Admitting: Ophthalmology

## 2020-03-17 ENCOUNTER — Ambulatory Visit
Admission: RE | Admit: 2020-03-17 | Discharge: 2020-03-17 | Disposition: A | Discharge status: Home / Self Care | Payer: BC Managed Care – PPO | Source: Ambulatory Visit | Attending: Ophthalmology | Admitting: Ophthalmology

## 2020-03-17 DIAGNOSIS — Z87891 Personal history of nicotine dependence: Secondary | ICD-10-CM | POA: Diagnosis not present

## 2020-03-17 DIAGNOSIS — M549 Dorsalgia, unspecified: Secondary | ICD-10-CM | POA: Insufficient documentation

## 2020-03-17 DIAGNOSIS — G8929 Other chronic pain: Secondary | ICD-10-CM | POA: Diagnosis not present

## 2020-03-17 DIAGNOSIS — H25811 Combined forms of age-related cataract, right eye: Secondary | ICD-10-CM | POA: Diagnosis not present

## 2020-03-17 DIAGNOSIS — Z79891 Long term (current) use of opiate analgesic: Secondary | ICD-10-CM | POA: Diagnosis not present

## 2020-03-17 DIAGNOSIS — H2511 Age-related nuclear cataract, right eye: Secondary | ICD-10-CM | POA: Diagnosis not present

## 2020-03-17 HISTORY — PX: CATARACT EXTRACTION W/PHACO: SHX586

## 2020-03-17 SURGERY — PHACOEMULSIFICATION, CATARACT, WITH IOL INSERTION
Anesthesia: Monitor Anesthesia Care | Site: Eye | Laterality: Right

## 2020-03-17 MED ORDER — SODIUM HYALURONATE 10 MG/ML IO SOLN
INTRAOCULAR | Status: DC | PRN
  Administered 2020-03-17: 0.55 mL via INTRAOCULAR

## 2020-03-17 MED ORDER — ACETAMINOPHEN 325 MG PO TABS: 325.0000 mg | ORAL_TABLET | Freq: Once | ORAL | Status: DC

## 2020-03-17 MED ORDER — MIDAZOLAM HCL 2 MG/2ML IJ SOLN
INTRAMUSCULAR | Status: DC | PRN
  Administered 2020-03-17 (×4): 1 mg via INTRAVENOUS

## 2020-03-17 MED ORDER — MOXIFLOXACIN HCL 0.5 % OP SOLN
OPHTHALMIC | Status: DC | PRN
  Administered 2020-03-17: 0.2 mL via OPHTHALMIC

## 2020-03-17 MED ORDER — TETRACAINE HCL 0.5 % OP SOLN
1.0000 [drp] | OPHTHALMIC | Status: DC | PRN
  Administered 2020-03-17 (×3): 1 [drp] via OPHTHALMIC

## 2020-03-17 MED ORDER — SODIUM HYALURONATE 23 MG/ML IO SOLN
INTRAOCULAR | Status: DC | PRN
  Administered 2020-03-17: 0.6 mL via INTRAOCULAR

## 2020-03-17 MED ORDER — ACETAMINOPHEN 160 MG/5ML PO SOLN: 325.0000 mg | Freq: Once | ORAL | Status: DC

## 2020-03-17 MED ORDER — EPINEPHRINE PF 1 MG/ML IJ SOLN
INTRAOCULAR | Status: DC | PRN
  Administered 2020-03-17: 56 mL via OPHTHALMIC

## 2020-03-17 MED ORDER — FENTANYL CITRATE (PF) 100 MCG/2ML IJ SOLN
INTRAMUSCULAR | Status: DC | PRN
Start: 2020-03-17 — End: 2020-03-17
  Administered 2020-03-17 (×2): 50 ug via INTRAVENOUS

## 2020-03-17 MED ORDER — LIDOCAINE HCL (PF) 2 % IJ SOLN
INTRAOCULAR | Status: DC | PRN
  Administered 2020-03-17: 1 mL via INTRAOCULAR

## 2020-03-17 MED ORDER — ARMC OPHTHALMIC DILATING DROPS
1.0000 "application " | OPHTHALMIC | Status: DC | PRN
  Administered 2020-03-17 (×3): 1 via OPHTHALMIC

## 2020-03-17 MED ORDER — LACTATED RINGERS IV SOLN: INTRAVENOUS | Status: DC

## 2020-03-17 SURGICAL SUPPLY — 18 items
CANNULA ANT/CHMB 27GA (MISCELLANEOUS) ×6 IMPLANT
DISSECTOR HYDRO NUCLEUS 50X22 (MISCELLANEOUS) ×3 IMPLANT
GLOVE SURG LX 7.5 STRW (GLOVE) ×2
GLOVE SURG LX STRL 7.5 STRW (GLOVE) ×1 IMPLANT
GLOVE SURG SYN 8.5  E (GLOVE) ×3
GLOVE SURG SYN 8.5 E (GLOVE) ×1 IMPLANT
GOWN STRL REUS W/ TWL LRG LVL3 (GOWN DISPOSABLE) ×2 IMPLANT
GOWN STRL REUS W/TWL LRG LVL3 (GOWN DISPOSABLE) ×6
LENS IOL DIOP 20.0 (Intraocular Lens) ×3 IMPLANT
LENS IOL TECNIS MONO 20.0 (Intraocular Lens) ×1 IMPLANT
MARKER SKIN DUAL TIP RULER LAB (MISCELLANEOUS) ×3 IMPLANT
PACK DR. KING ARMS (PACKS) ×3 IMPLANT
PACK EYE AFTER SURG (MISCELLANEOUS) ×3 IMPLANT
PACK OPTHALMIC (MISCELLANEOUS) ×3 IMPLANT
SYR 3ML LL SCALE MARK (SYRINGE) ×3 IMPLANT
SYR TB 1ML LUER SLIP (SYRINGE) ×3 IMPLANT
WATER STERILE IRR 250ML POUR (IV SOLUTION) ×3 IMPLANT
WIPE NON LINTING 3.25X3.25 (MISCELLANEOUS) ×3 IMPLANT

## 2020-03-17 NOTE — Anesthesia Preprocedure Evaluation (Signed)
Anesthesia Evaluation  Patient identified by MRN, date of birth, ID band Patient awake    Reviewed: Allergy & Precautions, H&P , NPO status , Patient's Chart, lab work & pertinent test results, reviewed documented beta blocker date and time   Airway Mallampati: II  TM Distance: >3 FB Neck ROM: full    Dental no notable dental hx.    Pulmonary neg pulmonary ROS, former smoker,    Pulmonary exam normal breath sounds clear to auscultation       Cardiovascular Exercise Tolerance: Good negative cardio ROS Normal cardiovascular exam Rhythm:regular Rate:Normal     Neuro/Psych  Neuromuscular disease (h/o cauda equina syndrome s/p lami) negative psych ROS   GI/Hepatic negative GI ROS, Neg liver ROS, H/o alcohol abuse, now in remission   Endo/Other  negative endocrine ROS  Renal/GU negative Renal ROS  negative genitourinary   Musculoskeletal   Abdominal   Peds  Hematology negative hematology ROS (+)   Anesthesia Other Findings   Reproductive/Obstetrics negative OB ROS                             Anesthesia Physical  Anesthesia Plan  ASA: II  Anesthesia Plan: MAC   Post-op Pain Management:    Induction:   PONV Risk Score and Plan: 1 and Treatment may vary due to age or medical condition, TIVA and Midazolam  Airway Management Planned:   Additional Equipment:   Intra-op Plan:   Post-operative Plan:   Informed Consent: I have reviewed the patients History and Physical, chart, labs and discussed the procedure including the risks, benefits and alternatives for the proposed anesthesia with the patient or authorized representative who has indicated his/her understanding and acceptance.     Dental Advisory Given  Plan Discussed with: CRNA  Anesthesia Plan Comments:         Anesthesia Quick Evaluation

## 2020-03-17 NOTE — Op Note (Signed)
OPERATIVE NOTE  [name] [id number] 03/17/2020   PREOPERATIVE DIAGNOSIS:  Nuclear sclerotic cataract right eye.  H25.11   POSTOPERATIVE DIAGNOSIS:    Nuclear sclerotic cataract right eye.     PROCEDURE:  Phacoemusification with posterior chamber intraocular lens placement of the right eye   LENS:   Implant Name Type Inv. Item Serial No. Manufacturer Lot No. LRB No. Used Action  LENS IOL DIOP 20.0 - [id number] Intraocular Lens LENS IOL DIOP 20.0 [id number] [employer]  Right 1 Implanted       Procedure(s): CATARACT EXTRACTION PHACO AND INTRAOCULAR LENS PLACEMENT (IOC) RIGHT 4.41  00:28.1 (Right)  DCB00 +20.0   ULTRASOUND TIME: 0 minutes 28.1 seconds.  CDE 4.41   SURGEON:  Benay Pillow, MD, MPH  ANESTHESIOLOGIST: Anesthesiologist: Ronelle Nigh, MD CRNA: Cameron Ali, CRNA   ANESTHESIA:  Topical with tetracaine drops augmented with 1% preservative-free intracameral lidocaine.  ESTIMATED BLOOD LOSS: less than 1 mL.   COMPLICATIONS:  None.   DESCRIPTION OF PROCEDURE:  The patient was identified in the holding room and transported to the operating room and placed in the supine position under the operating microscope.  The right eye was identified as the operative eye and it was prepped and draped in the usual sterile ophthalmic fashion.   A 1.0 millimeter clear-corneal paracentesis was made at the 10:30 position. 0.5 ml of preservative-free 1% lidocaine with epinephrine was injected into the anterior chamber.  The anterior chamber was filled with Healon 5 viscoelastic.  A 2.4 millimeter keratome was used to make a near-clear corneal incision at the 8:00 position.  A curvilinear capsulorrhexis was made with a cystotome and capsulorrhexis forceps.  Balanced salt solution was used to hydrodissect and hydrodelineate the nucleus.   Phacoemulsification was then used in stop and chop fashion to remove the lens nucleus and epinucleus.  The remaining cortex was then  removed using the irrigation and aspiration handpiece. Healon was then placed into the capsular bag to distend it for lens placement.  A lens was then injected into the capsular bag.  The remaining viscoelastic was aspirated.   Wounds were hydrated with balanced salt solution.  The anterior chamber was inflated to a physiologic pressure with balanced salt solution.   Intracameral vigamox 0.1 mL undiluted was injected into the eye and a drop placed onto the ocular surface.  No wound leaks were noted.  The patient was taken to the recovery room in stable condition without complications of anesthesia or surgery  Benay Pillow 03/17/2020, 12:19 PM

## 2020-03-17 NOTE — Anesthesia Procedure Notes (Signed)
Procedure Name: MAC Date/Time: 03/17/2020 11:59 AM Performed by: Vanetta Shawl, CRNA Pre-anesthesia Checklist: Patient identified, Emergency Drugs available, Suction available, Timeout performed and Patient being monitored Patient Re-evaluated:Patient Re-evaluated prior to induction Oxygen Delivery Method: Nasal cannula Placement Confirmation: positive ETCO2

## 2020-03-17 NOTE — Transfer of Care (Signed)
Immediate Anesthesia Transfer of Care Note  Patient: [name]  Procedure(s) Performed: CATARACT EXTRACTION PHACO AND INTRAOCULAR LENS PLACEMENT (IOC) RIGHT 4.41  00:28.1 (Right Eye)  Patient Location: PACU  Anesthesia Type: MAC  Level of Consciousness: awake, alert  and patient cooperative  Airway and Oxygen Therapy: Patient Spontanous Breathing and Patient connected to supplemental oxygen  Post-op Assessment: Post-op Vital signs reviewed, Patient's Cardiovascular Status Stable, Respiratory Function Stable, Patent Airway and No signs of Nausea or vomiting  Post-op Vital Signs: Reviewed and stable  Complications: No complications documented.

## 2020-03-17 NOTE — Anesthesia Postprocedure Evaluation (Signed)
Anesthesia Post Note  Patient: [name]  Procedure(s) Performed: CATARACT EXTRACTION PHACO AND INTRAOCULAR LENS PLACEMENT (IOC) RIGHT 4.41  00:28.1 (Right Eye)     Patient location during evaluation: PACU Anesthesia Type: MAC Level of consciousness: awake and alert and oriented Pain management: satisfactory to patient Vital Signs Assessment: post-procedure vital signs reviewed and stable Respiratory status: spontaneous breathing, nonlabored ventilation and respiratory function stable Cardiovascular status: blood pressure returned to baseline and stable Postop Assessment: Adequate PO intake and No signs of nausea or vomiting Anesthetic complications: no   No complications documented.  Raliegh Ip

## 2020-03-17 NOTE — H&P (Signed)

## 2020-03-18 ENCOUNTER — Encounter: Payer: Self-pay | Admitting: Ophthalmology

## 2020-03-26 ENCOUNTER — Ambulatory Visit: Payer: BC Managed Care – PPO | Admitting: Family Medicine

## 2020-03-27 ENCOUNTER — Other Ambulatory Visit: Payer: Self-pay

## 2020-03-27 ENCOUNTER — Encounter: Payer: Self-pay | Admitting: Family Medicine

## 2020-03-27 ENCOUNTER — Ambulatory Visit (INDEPENDENT_AMBULATORY_CARE_PROVIDER_SITE_OTHER): Payer: BC Managed Care – PPO | Admitting: Family Medicine

## 2020-03-27 VITALS — BP 122/80 | HR 86 | Temp 97.5°F | Wt 234.0 lb

## 2020-03-27 DIAGNOSIS — Z9889 Other specified postprocedural states: Secondary | ICD-10-CM | POA: Diagnosis not present

## 2020-03-27 DIAGNOSIS — F4321 Adjustment disorder with depressed mood: Secondary | ICD-10-CM

## 2020-03-27 MED ORDER — ESCITALOPRAM OXALATE 20 MG PO TABS: 20.0000 mg | ORAL_TABLET | Freq: Every day | ORAL | 1 refills | Status: DC

## 2020-03-27 NOTE — Patient Instructions (Addendum)
Curcumin or Tumeric  Research has shown that Curcumin (Tumeric) supplements are just as good as high dose anti-inflammatory medications (like diclofenac and ibuprofen) at treating pain from osteoarthritis without the side effects.   Take Curcumin 500 mg Three times daily   -- You can find a bottle at [employer] for $8 which should last a month -- [place] is around $9 and is available at [facility] to return to work - listen to your body   Return for oxycodone refills when needed

## 2020-03-27 NOTE — Assessment & Plan Note (Signed)
Movement significantly improved. His wound has healed - still with some pain but able to do increasing more activities. Using oxycodone once daily and tylenol. Feels ready to return to work and I agree he can return. Advised slow escalation and continued rehab to work on strengthening and pain reduction. Trial of tumeric to help support pain control and avoid increased oxycodone.

## 2020-03-27 NOTE — Assessment & Plan Note (Signed)
Notes little improvement on lexapro. Will increase dose and reassess at follow-up. Suspect returning to work will improve overall mood.

## 2020-03-27 NOTE — Progress Notes (Signed)
Subjective:     [name] is a 54 y.o. male presenting for discuss return to work     HPI  #Return to work - is able to get up and get down - is able to care for himself - completed physical therapy - and still doing home PT - got on his motorcycle and drove that and felt comfortable - wound is healed - told there is scar tissue - the wound has healed there - pain - still taking 10 mg oxycodone daily - primarily before major activity - pain medication primarily for his chronic back pain - still getting some buttock shaking w/ pain - doing rehab for the back - taking flexeril every other day - taking tylenol as needed  Has talked with supervisor -- is planning to work with him on light duty and based on what he is able to do   Review of Systems   Social History   Tobacco Use  Smoking Status Former Smoker  . Packs/day: 1.00  . Years: 14.00  . Pack years: 14.00  . Types: Cigarettes  . Quit date: 07/27/1999  . Years since quitting: 20.6  Smokeless Tobacco Never Used        Objective:    BP Readings from Last 3 Encounters:  03/27/20 122/80  03/17/20 121/80  02/05/20 110/70   Wt Readings from Last 3 Encounters:  03/27/20 234 lb (106.1 kg)  03/17/20 233 lb (105.7 kg)  02/05/20 235 lb 8 oz (106.8 kg)    BP 122/80   Pulse 86   Temp (!) 97.5 F (36.4 C) (Temporal)   Wt 234 lb (106.1 kg)   SpO2 98%   BMI 31.74 kg/m    Physical Exam Constitutional:      Appearance: Normal appearance. He is not ill-appearing or diaphoretic.  HENT:     Right Ear: External ear normal.     Left Ear: External ear normal.  Eyes:     General: No scleral icterus.    Extraocular Movements: Extraocular movements intact.     Conjunctiva/sclera: Conjunctivae normal.  Cardiovascular:     Rate and Rhythm: Normal rate.  Pulmonary:     Effort: Pulmonary effort is normal.  Musculoskeletal:     Cervical back: Neck supple.     Comments: Back:  Inspection: right sided  larger Palpation: No spinous or paraspinous ttp ROM: normal w/o worsening pain Strength: right hip flexor 4/5, otherwise normal strength   Skin:    General: Skin is warm and dry.  Neurological:     Mental Status: He is alert. Mental status is at baseline.  Psychiatric:        Mood and Affect: Mood normal.           Assessment & Plan:   Problem List Items Addressed This Visit      Other   S/P lumbar laminectomy - Primary    Movement significantly improved. His wound has healed - still with some pain but able to do increasing more activities. Using oxycodone once daily and tylenol. Feels ready to return to work and I agree he can return. Advised slow escalation and continued rehab to work on strengthening and pain reduction. Trial of tumeric to help support pain control and avoid increased oxycodone.       Adjustment disorder with depressed mood    Notes little improvement on lexapro. Will increase dose and reassess at follow-up. Suspect returning to work will improve overall mood.  Relevant Medications   escitalopram (LEXAPRO) 20 MG tablet       Return in about 6 weeks (around 05/08/2020) for oxycodone refill.  Lesleigh Noe, MD  This visit occurred during the SARS-CoV-2 public health emergency.  Safety protocols were in place, including screening questions prior to the visit, additional usage of staff PPE, and extensive cleaning of exam room while observing appropriate contact time as indicated for disinfecting solutions.

## 2020-04-04 ENCOUNTER — Telehealth: Payer: Self-pay | Admitting: Family Medicine

## 2020-04-04 NOTE — Telephone Encounter (Signed)
Pt states he needs a letter to go back to work on regular duty. He said they sent him home today because of the word light duty being on the letter he submitted. 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 He said he worked all week and today they tell him this. He can't go back to work without this letter. He said he feels fine just need this letter as soon as possible.

## 2020-04-04 NOTE — Telephone Encounter (Signed)
Letter completed, per Dr. Einar Pheasant, and placed up front for pick up. Pt notified and he will be coming today to pick up.

## 2020-04-04 NOTE — Telephone Encounter (Signed)
[place] for letter to return to work for full duty. Please complete letter for patient

## 2020-04-08 ENCOUNTER — Telehealth: Payer: Self-pay

## 2020-04-08 NOTE — Telephone Encounter (Signed)
Pt's daughter left message on triage line stating she had some questions about her Dad. I tried to call her back but there was no VM set up to leave a message. Please try to call her back at [phone].

## 2020-04-09 ENCOUNTER — Emergency Department (HOSPITAL_COMMUNITY): Payer: BC Managed Care – PPO

## 2020-04-09 ENCOUNTER — Inpatient Hospital Stay (HOSPITAL_COMMUNITY)
Admission: EM | Admit: 2020-04-09 | Discharge: 2020-04-15 | DRG: 466 | Disposition: A | Discharge status: Home Health | Payer: BC Managed Care – PPO | Attending: Family Medicine | Admitting: Family Medicine

## 2020-04-09 ENCOUNTER — Other Ambulatory Visit: Payer: Self-pay

## 2020-04-09 ENCOUNTER — Encounter (HOSPITAL_COMMUNITY): Payer: Self-pay

## 2020-04-09 DIAGNOSIS — F329 Major depressive disorder, single episode, unspecified: Secondary | ICD-10-CM | POA: Diagnosis present

## 2020-04-09 DIAGNOSIS — M009 Pyogenic arthritis, unspecified: Secondary | ICD-10-CM | POA: Diagnosis not present

## 2020-04-09 DIAGNOSIS — M4628 Osteomyelitis of vertebra, sacral and sacrococcygeal region: Secondary | ICD-10-CM | POA: Diagnosis present

## 2020-04-09 DIAGNOSIS — Z96642 Presence of left artificial hip joint: Secondary | ICD-10-CM | POA: Diagnosis not present

## 2020-04-09 DIAGNOSIS — R7881 Bacteremia: Secondary | ICD-10-CM | POA: Diagnosis not present

## 2020-04-09 DIAGNOSIS — Z96641 Presence of right artificial hip joint: Secondary | ICD-10-CM | POA: Diagnosis present

## 2020-04-09 DIAGNOSIS — Z825 Family history of asthma and other chronic lower respiratory diseases: Secondary | ICD-10-CM

## 2020-04-09 DIAGNOSIS — T8452XA Infection and inflammatory reaction due to internal left hip prosthesis, initial encounter: Principal | ICD-10-CM | POA: Diagnosis present

## 2020-04-09 DIAGNOSIS — Z79899 Other long term (current) drug therapy: Secondary | ICD-10-CM

## 2020-04-09 DIAGNOSIS — M60862 Other myositis, left lower leg: Secondary | ICD-10-CM | POA: Diagnosis not present

## 2020-04-09 DIAGNOSIS — Z8661 Personal history of infections of the central nervous system: Secondary | ICD-10-CM | POA: Diagnosis not present

## 2020-04-09 DIAGNOSIS — Z8249 Family history of ischemic heart disease and other diseases of the circulatory system: Secondary | ICD-10-CM | POA: Diagnosis not present

## 2020-04-09 DIAGNOSIS — B9561 Methicillin susceptible Staphylococcus aureus infection as the cause of diseases classified elsewhere: Secondary | ICD-10-CM | POA: Diagnosis present

## 2020-04-09 DIAGNOSIS — Z87891 Personal history of nicotine dependence: Secondary | ICD-10-CM

## 2020-04-09 DIAGNOSIS — L0231 Cutaneous abscess of buttock: Secondary | ICD-10-CM | POA: Diagnosis present

## 2020-04-09 DIAGNOSIS — M25552 Pain in left hip: Secondary | ICD-10-CM

## 2020-04-09 DIAGNOSIS — M00052 Staphylococcal arthritis, left hip: Secondary | ICD-10-CM | POA: Diagnosis present

## 2020-04-09 DIAGNOSIS — I700 Atherosclerosis of aorta: Secondary | ICD-10-CM | POA: Diagnosis not present

## 2020-04-09 DIAGNOSIS — R52 Pain, unspecified: Secondary | ICD-10-CM | POA: Diagnosis not present

## 2020-04-09 DIAGNOSIS — M545 Low back pain, unspecified: Secondary | ICD-10-CM | POA: Diagnosis present

## 2020-04-09 DIAGNOSIS — M79605 Pain in left leg: Secondary | ICD-10-CM | POA: Diagnosis not present

## 2020-04-09 DIAGNOSIS — M8618 Other acute osteomyelitis, other site: Secondary | ICD-10-CM | POA: Diagnosis not present

## 2020-04-09 DIAGNOSIS — T8452XD Infection and inflammatory reaction due to internal left hip prosthesis, subsequent encounter: Secondary | ICD-10-CM | POA: Diagnosis not present

## 2020-04-09 DIAGNOSIS — M25452 Effusion, left hip: Secondary | ICD-10-CM | POA: Diagnosis not present

## 2020-04-09 DIAGNOSIS — G061 Intraspinal abscess and granuloma: Secondary | ICD-10-CM | POA: Diagnosis present

## 2020-04-09 DIAGNOSIS — Y831 Surgical operation with implant of artificial internal device as the cause of abnormal reaction of the patient, or of later complication, without mention of misadventure at the time of the procedure: Secondary | ICD-10-CM | POA: Diagnosis present

## 2020-04-09 DIAGNOSIS — R5381 Other malaise: Secondary | ICD-10-CM | POA: Diagnosis not present

## 2020-04-09 DIAGNOSIS — N179 Acute kidney failure, unspecified: Secondary | ICD-10-CM | POA: Diagnosis present

## 2020-04-09 DIAGNOSIS — Z471 Aftercare following joint replacement surgery: Secondary | ICD-10-CM | POA: Diagnosis not present

## 2020-04-09 DIAGNOSIS — K6812 Psoas muscle abscess: Secondary | ICD-10-CM | POA: Diagnosis not present

## 2020-04-09 DIAGNOSIS — F419 Anxiety disorder, unspecified: Secondary | ICD-10-CM | POA: Diagnosis not present

## 2020-04-09 DIAGNOSIS — G8929 Other chronic pain: Secondary | ICD-10-CM | POA: Diagnosis present

## 2020-04-09 DIAGNOSIS — Z20822 Contact with and (suspected) exposure to covid-19: Secondary | ICD-10-CM | POA: Diagnosis not present

## 2020-04-09 DIAGNOSIS — Z96643 Presence of artificial hip joint, bilateral: Secondary | ICD-10-CM | POA: Diagnosis not present

## 2020-04-09 DIAGNOSIS — E871 Hypo-osmolality and hyponatremia: Secondary | ICD-10-CM | POA: Diagnosis present

## 2020-04-09 DIAGNOSIS — R509 Fever, unspecified: Secondary | ICD-10-CM

## 2020-04-09 DIAGNOSIS — M609 Myositis, unspecified: Secondary | ICD-10-CM

## 2020-04-09 DIAGNOSIS — Z807 Family history of other malignant neoplasms of lymphoid, hematopoietic and related tissues: Secondary | ICD-10-CM | POA: Diagnosis not present

## 2020-04-09 DIAGNOSIS — D649 Anemia, unspecified: Secondary | ICD-10-CM | POA: Diagnosis not present

## 2020-04-09 DIAGNOSIS — R531 Weakness: Secondary | ICD-10-CM | POA: Diagnosis not present

## 2020-04-09 DIAGNOSIS — N281 Cyst of kidney, acquired: Secondary | ICD-10-CM | POA: Diagnosis not present

## 2020-04-09 DIAGNOSIS — Z96649 Presence of unspecified artificial hip joint: Secondary | ICD-10-CM

## 2020-04-09 DIAGNOSIS — M4646 Discitis, unspecified, lumbar region: Secondary | ICD-10-CM | POA: Diagnosis not present

## 2020-04-09 LAB — COMPREHENSIVE METABOLIC PANEL
ALT: 66 U/L — ABNORMAL HIGH (ref 0–44)
AST: 39 U/L (ref 15–41)
Albumin: 3.3 g/dL — ABNORMAL LOW (ref 3.5–5.0)
Alkaline Phosphatase: 163 U/L — ABNORMAL HIGH (ref 38–126)
Anion gap: 13 (ref 5–15)
BUN: 26 mg/dL — ABNORMAL HIGH (ref 6–20)
CO2: 24 mmol/L (ref 22–32)
Calcium: 9.3 mg/dL (ref 8.9–10.3)
Chloride: 95 mmol/L — ABNORMAL LOW (ref 98–111)
Creatinine, Ser: 1.37 mg/dL — ABNORMAL HIGH (ref 0.61–1.24)
GFR calc Af Amer: >60 mL/min (ref >60)
GFR calc non Af Amer: 58 mL/min — ABNORMAL LOW (ref >60)
Glucose, Bld: 102 mg/dL — ABNORMAL HIGH (ref 70–99)
Potassium: 4 mmol/L (ref 3.5–5.1)
Sodium: 132 mmol/L — ABNORMAL LOW (ref 135–145)
Total Bilirubin: 2.1 mg/dL — ABNORMAL HIGH (ref 0.3–1.2)
Total Protein: 9.4 g/dL — ABNORMAL HIGH (ref 6.5–8.1)

## 2020-04-09 LAB — CBC WITH DIFFERENTIAL/PLATELET
Abs Immature Granulocytes: 0.08 10*3/uL — ABNORMAL HIGH (ref 0.00–0.07)
Basophils Absolute: 0.1 10*3/uL (ref 0.0–0.1)
Basophils Relative: 0 %
Eosinophils Absolute: 0 10*3/uL (ref 0.0–0.5)
Eosinophils Relative: 0 %
HCT: 43.4 % (ref 39.0–52.0)
Hemoglobin: 14.1 g/dL (ref 13.0–17.0)
Immature Granulocytes: 1 %
Lymphocytes Relative: 10 %
Lymphs Abs: 1.3 10*3/uL (ref 0.7–4.0)
MCH: 31.1 pg (ref 26.0–34.0)
MCHC: 32.5 g/dL (ref 30.0–36.0)
MCV: 95.8 fL (ref 80.0–100.0)
Monocytes Absolute: 1 10*3/uL (ref 0.1–1.0)
Monocytes Relative: 7 %
Neutro Abs: 11.1 10*3/uL — ABNORMAL HIGH (ref 1.7–7.7)
Neutrophils Relative %: 82 %
Platelets: 380 10*3/uL (ref 150–400)
RBC: 4.53 MIL/uL (ref 4.22–5.81)
RDW: 14 % (ref 11.5–15.5)
WBC: 13.6 10*3/uL — ABNORMAL HIGH (ref 4.0–10.5)
nRBC: 0 % (ref 0.0–0.2)

## 2020-04-09 LAB — LACTIC ACID, PLASMA: Lactic Acid, Venous: 2 mmol/L (ref 0.5–1.9)

## 2020-04-09 IMAGING — CR DG TIBIA/FIBULA 2V*L*
4 series · 4 of 4 positions shown · non-contrast
Comparison: None.

CLINICAL DATA: Leg pain

EXAM:
LEFT TIBIA AND FIBULA - 2 VIEW

[t tib-fib ap left (1 of 2)]
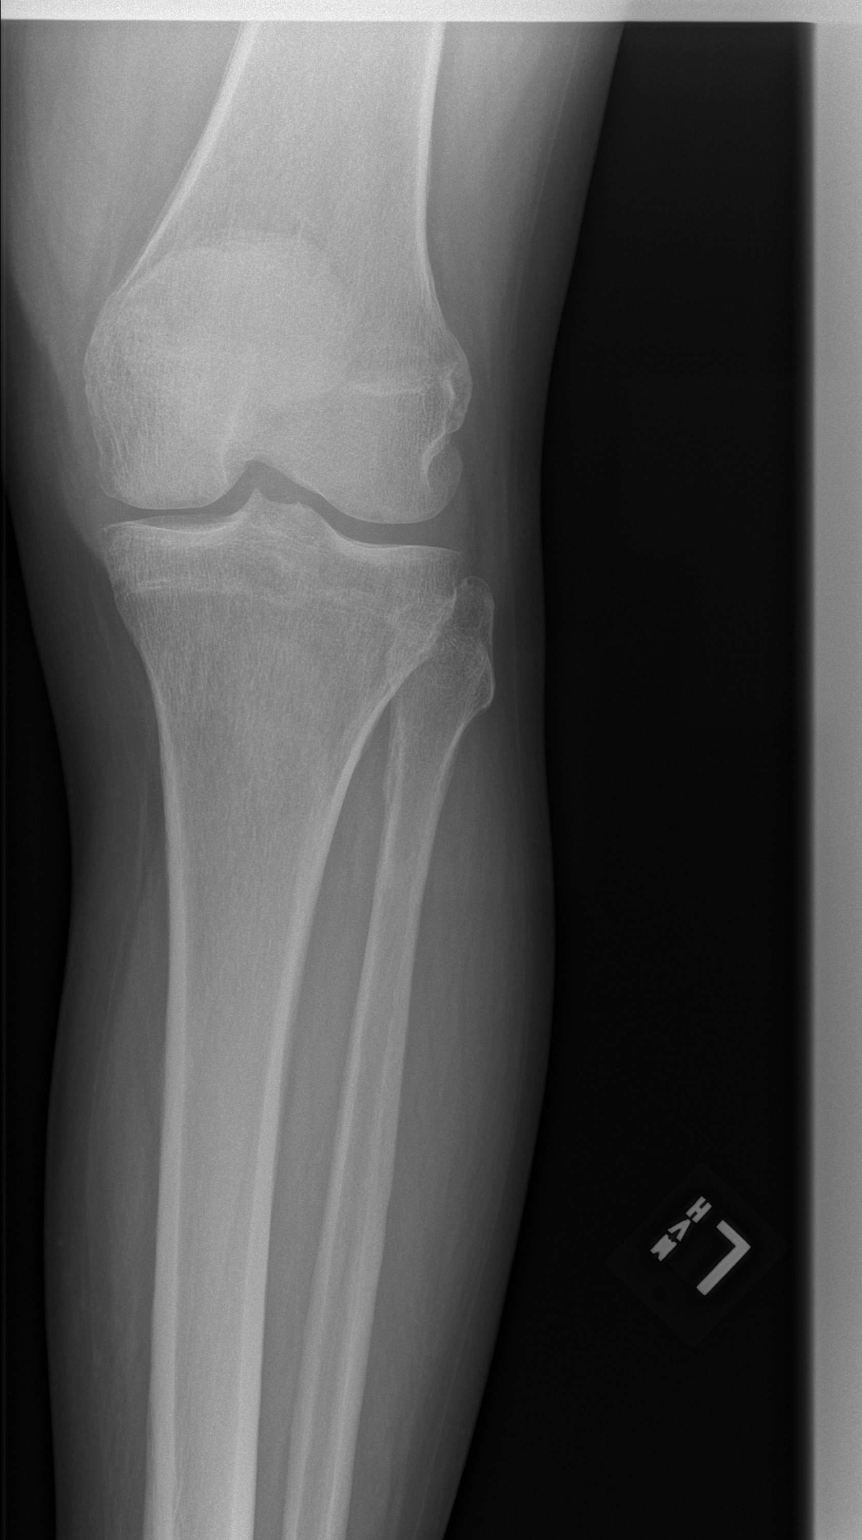

[t tib-fib ap left (2 of 2)]
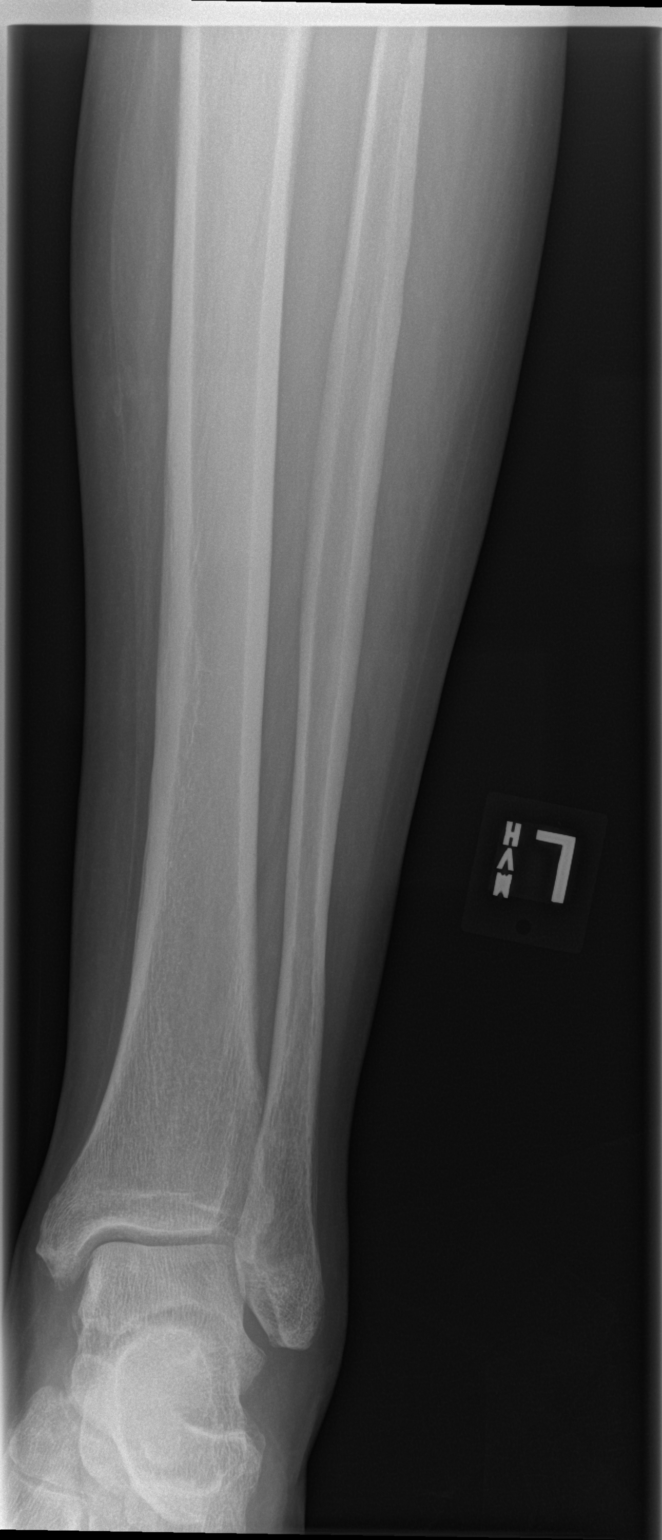

[x tib-fib ap left]
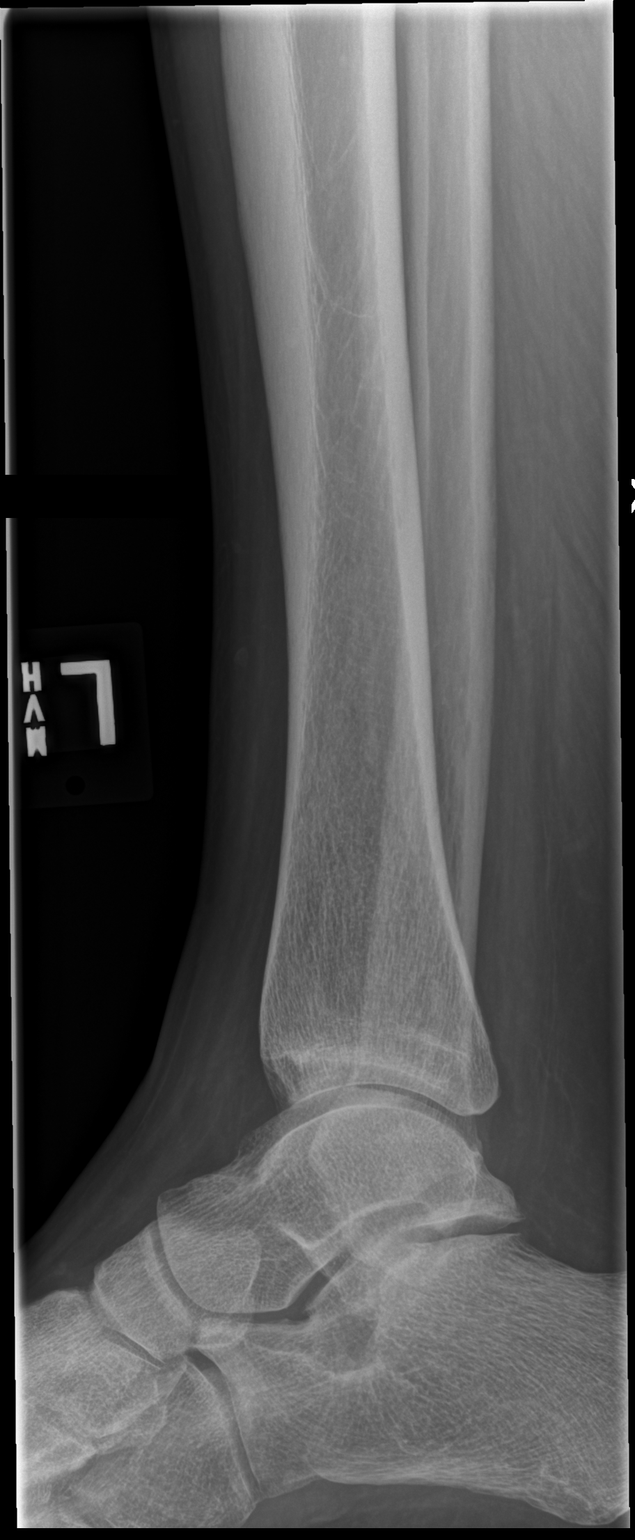

[x tib-fib lat left]
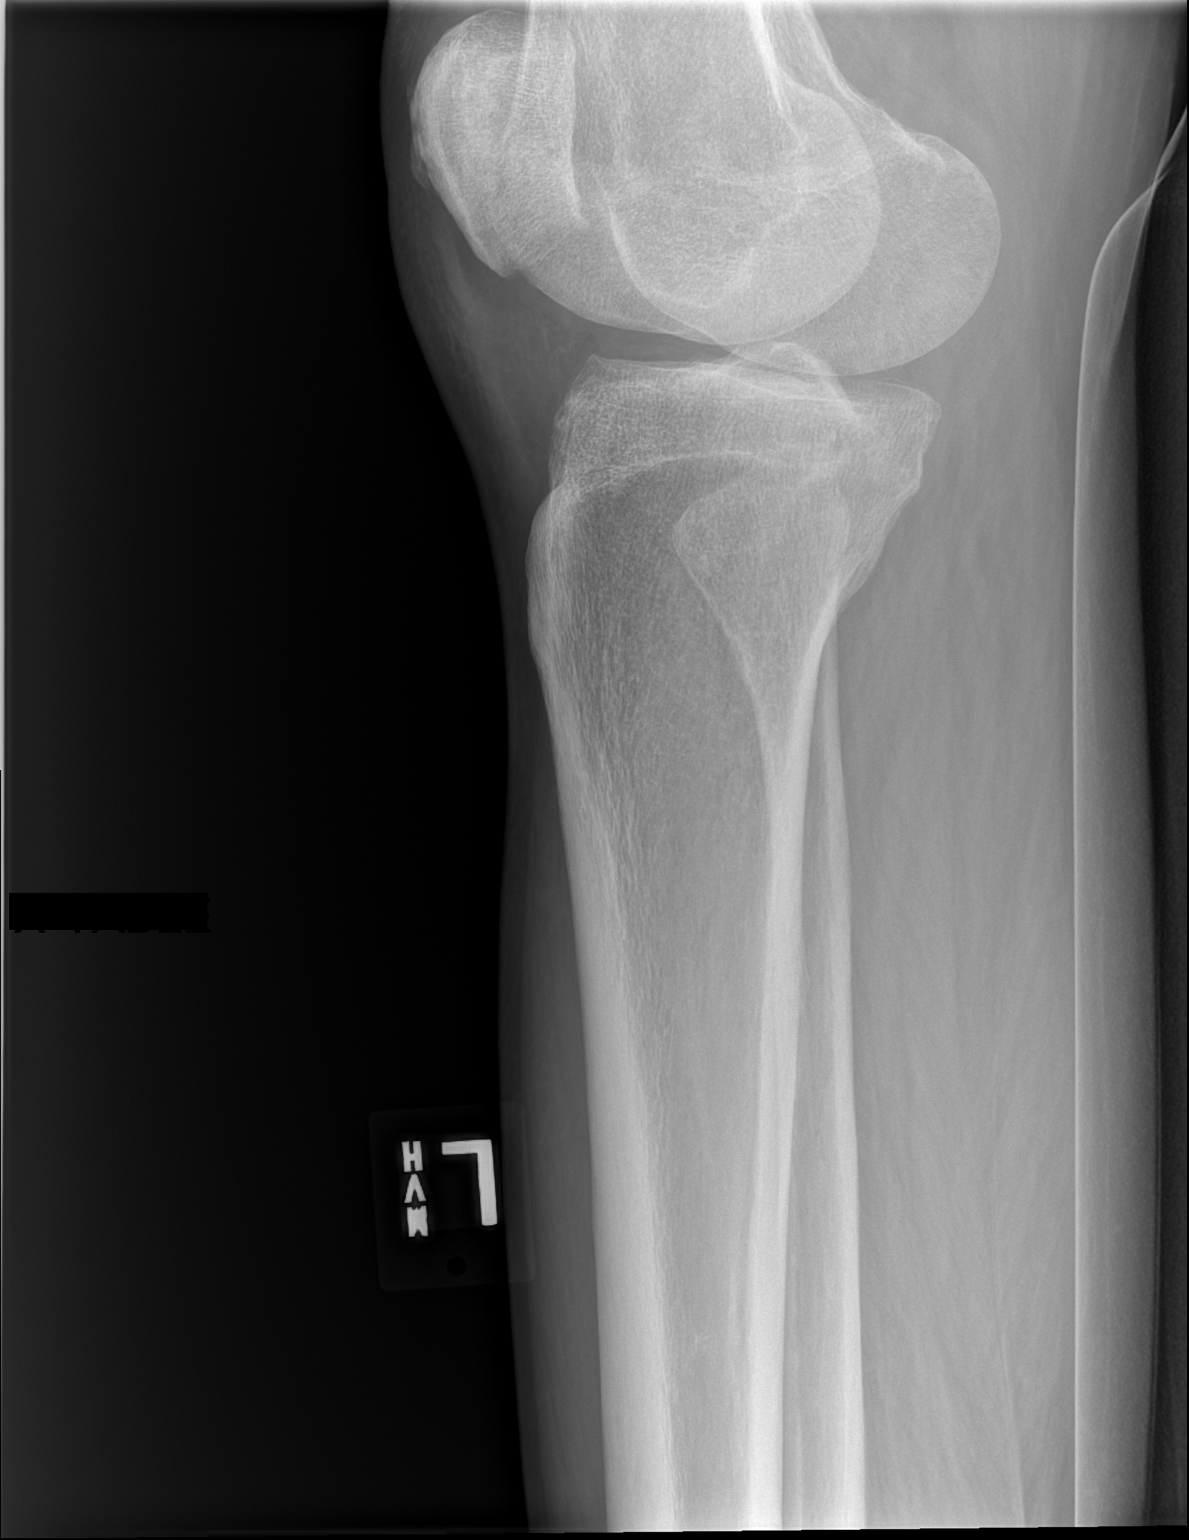

[4 of 4 positions shown; findings below may reference images not displayed]

FINDINGS: There is no evidence of fracture or other focal bone lesions. Soft
tissues are unremarkable.
IMPRESSION: Negative.

## 2020-04-09 IMAGING — CR DG FEMUR 1V*L*
2 series · 2 of 2 positions shown · non-contrast
Comparison: None.

CLINICAL DATA: Left hip pain

EXAM:
LEFT FEMUR 1 VIEW

[t femur proximal ap left]
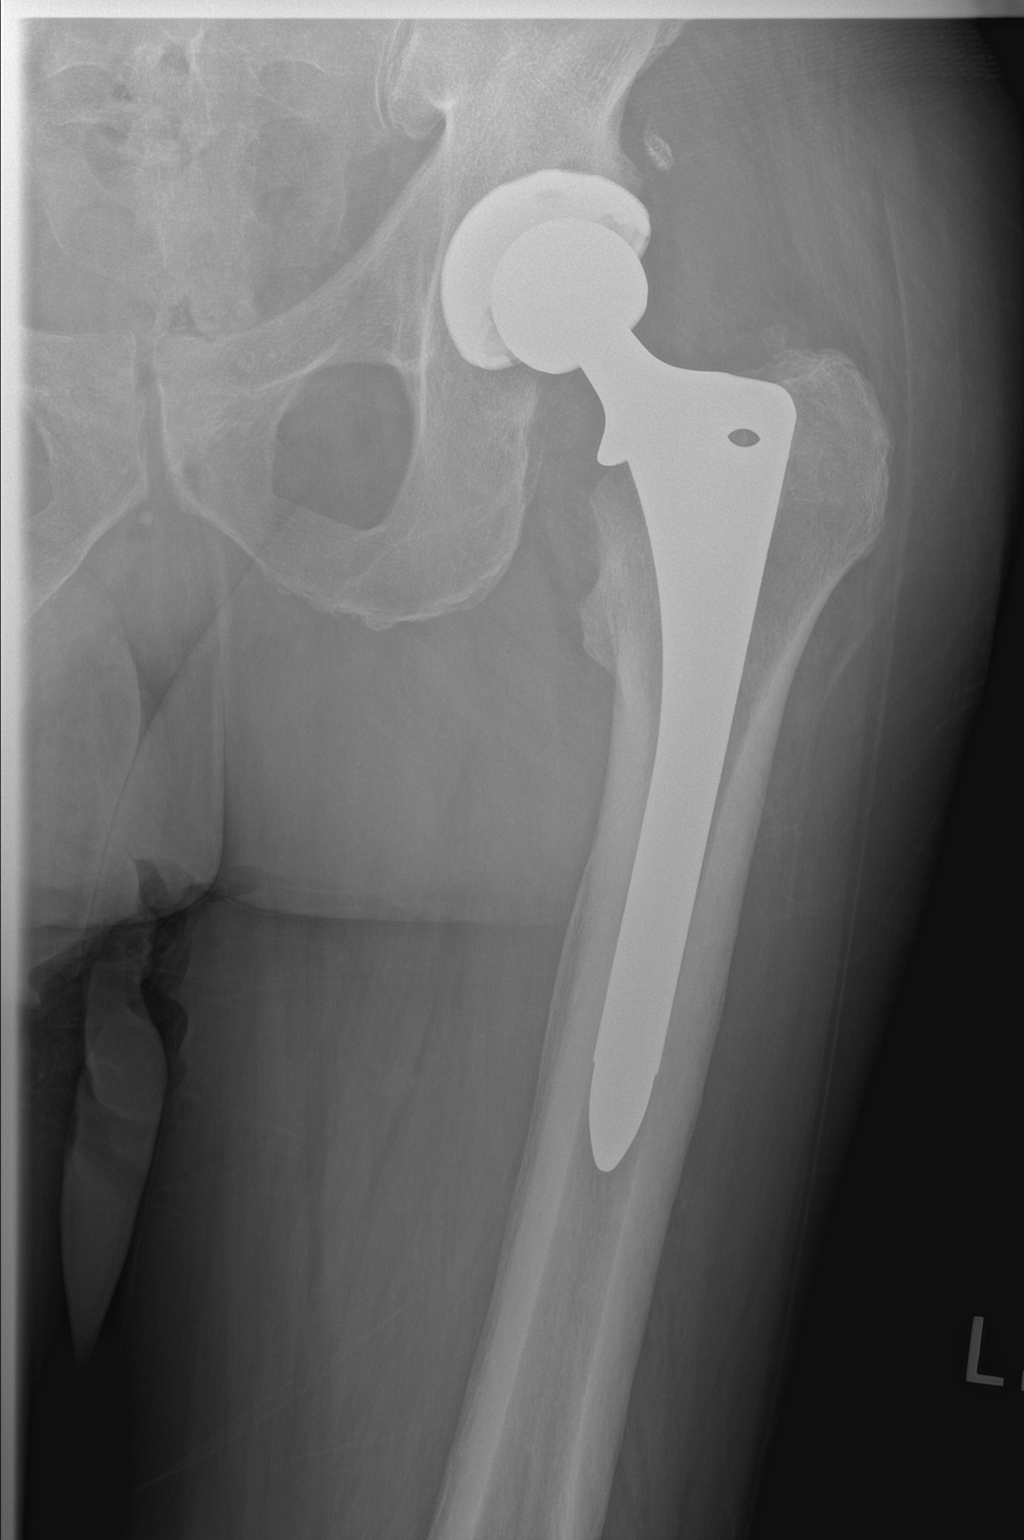

[t femur distal ap left]
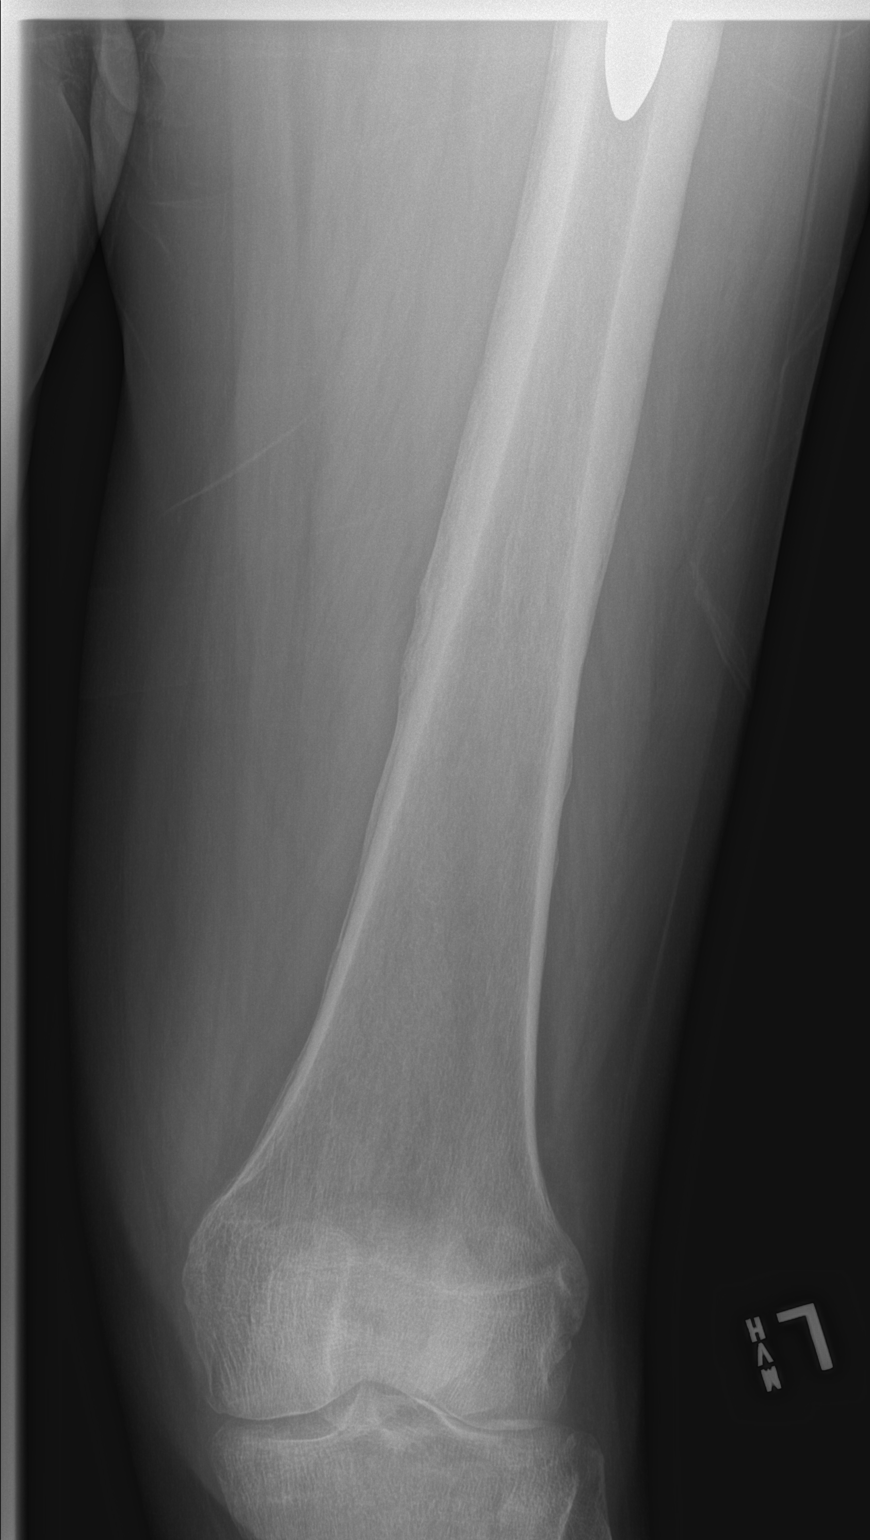

[2 of 2 positions shown; findings below may reference images not displayed]

FINDINGS: Single view radiograph of the left femur demonstrates surgical
changes of left total hip arthroplasty. Arthroplasty components
overlie the expected position. Mild stress buttressing noted
adjacent to the a distal aspect of the femoral stem component. No
suspicious lytic or blastic bone lesions. No acute fracture or
dislocation. Soft tissues are unremarkable.
IMPRESSION: 1. Postsurgical changes of left total hip arthroplasty. No evidence
of hardware complication.
2. No acute fracture or dislocation.

## 2020-04-09 MED ORDER — ACETAMINOPHEN 325 MG PO TABS
650.0000 mg | ORAL_TABLET | Freq: Once | ORAL | Status: AC
  Administered 2020-04-09: 650 mg via ORAL
  Filled 2020-04-09: qty 2

## 2020-04-09 MED ORDER — HYDROMORPHONE HCL 1 MG/ML IJ SOLN
1.0000 mg | Freq: Once | INTRAMUSCULAR | Status: AC
  Administered 2020-04-09: 1 mg via INTRAVENOUS
  Filled 2020-04-09: qty 1

## 2020-04-09 NOTE — ED Triage Notes (Signed)
Pt arrived via EMS from home, left leg and left hip pain x1 week. Denies any trauma to area.

## 2020-04-09 NOTE — Telephone Encounter (Signed)
Agree with ER evaluation

## 2020-04-09 NOTE — ED Provider Notes (Signed)
[facility] DEPT Provider Note   CSN: [id number] Arrival date & time: 04/09/20  1351     History Chief Complaint  Patient presents with  . Leg Pain  . Hip Pain    [name] is a 54 y.o. male.  Patient to ED with severe left hip and low back pain. He provides 10 month history that started as left hip pain, diagnosed with sepsis from a 'spinal infection' requiring a one month hospitalization and lumbar surgery, with intermittent symptoms of pain since that time. Most recently, he reports waking up 5 days ago unable to move because of pain in the back and left hip similar as previous episodes, with fever that started 2 days ago. No nausea, vomiting, diarrhea. He denies respiratory symptoms or urinary symptoms. No numbness or swelling of the lower extremities. No bowel or bladder dysfunction.   The history is provided by the patient. No language interpreter was used.  Leg Pain Associated symptoms: back pain and fever   Hip Pain Pertinent negatives include no abdominal pain.       Past Medical History:  Diagnosis Date  . Cauda equina syndrome ([place]) 07/31/2019  . Cerebral septic emboli ([place]) 07/30/2019  . History of chicken pox   . Medical history non-contributory     Patient Active Problem List   Diagnosis Date Noted  . Adjustment disorder with depressed mood 11/19/2019  . Left hip pain 10/09/2019  . Blurry vision, left eye 10/09/2019  . Hyponatremia 08/20/2019  . S/P lumbar laminectomy 07/25/2019  . H/O MSSA epidural abscess, L2-L5 07/25/2019  . Anemia 07/18/2019  . Obesity (BMI 35.0-39.9 without comorbidity) 07/18/2019  . Constipation 07/18/2019  . Elevated blood sugar 04/01/2017  . Avascular necrosis of bone of hip, left ([place]) 10/12/2016  . Alcohol abuse, in remission 10/12/2016  . S/P total hip arthroplasty 10/12/2016    Past Surgical History:  Procedure Laterality Date  . CARPAL TUNNEL RELEASE Bilateral 2009  . CATARACT EXTRACTION  W/PHACO Left 12/17/2019   Procedure: CATARACT EXTRACTION PHACO AND INTRAOCULAR LENS PLACEMENT (IOC) LEFT;  Surgeon: Eulogio Bear, MD;  Location: [facility];  Service: Ophthalmology;  Laterality: Left;  3.61 0:26.9  . CATARACT EXTRACTION W/PHACO Right 03/17/2020   Procedure: CATARACT EXTRACTION PHACO AND INTRAOCULAR LENS PLACEMENT (IOC) RIGHT 4.41  00:28.1;  Surgeon: Eulogio Bear, MD;  Location: [facility];  Service: Ophthalmology;  Laterality: Right;  . COLONOSCOPY W/ POLYPECTOMY    . JOINT REPLACEMENT    . LACERATION REPAIR Right ~ 2012   leg  . LUMBAR LAMINECTOMY/DECOMPRESSION MICRODISCECTOMY N/A 07/24/2019   Procedure: Decompressive Lumbar Laminectomy Lumbar three-four Lumbar four-five for Epidural Abscess;  Surgeon: Kary Kos, MD;  Location: [facility];  Service: Neurosurgery;  Laterality: N/A;  . TOTAL HIP ARTHROPLASTY Right 01/11/2013   Procedure: TOTAL HIP ARTHROPLASTY;  Surgeon: Garald Balding, MD;  Location: [facility];  Service: Orthopedics;  Laterality: Right;  . TOTAL HIP ARTHROPLASTY Left 10/12/2016   Procedure: TOTAL HIP ARTHROPLASTY;  Surgeon: Garald Balding, MD;  Location: [facility];  Service: Orthopedics;  Laterality: Left;       Family History  Problem Relation Age of Onset  . Cancer Father        Hodgkin's disease  . COPD Mother   . Heart attack Maternal Grandfather 80  . Diabetes Neg Hx   . Stroke Neg Hx   . Hypertension Neg Hx   . Hyperlipidemia Neg Hx     Social History  Tobacco Use  . Smoking status: Former Smoker    Packs/day: 1.00    Years: 14.00    Pack years: 14.00    Types: Cigarettes    Quit date: 07/27/1999    Years since quitting: 20.7  . Smokeless tobacco: Never Used  Vaping Use  . Vaping Use: Never used  Substance Use Topics  . Alcohol use: Yes    Alcohol/week: 24.0 standard drinks    Types: 24 Cans of beer per week    Comment: 4-5 beers per day, case of beer per week  . Drug use: No    Home Medications Prior to  Admission medications   Medication Sig Start Date End Date Taking? Authorizing Provider  acetaminophen (TYLENOL) 325 MG tablet Take 2 tablets (650 mg total) by mouth every 4 (four) hours as needed for mild pain ((score 1 to 3) or temp > 100.5). 08/09/19   Angiulli, Lavon Paganini, PA-C  cyclobenzaprine (FLEXERIL) 10 MG tablet TAKE 1 TABLET BY MOUTH THREE TIMES A DAY AS NEEDED FOR MUSCLE SPASMS 02/21/20   Lesleigh Noe, MD  escitalopram (LEXAPRO) 20 MG tablet Take 1 tablet (20 mg total) by mouth daily. 03/27/20   Lesleigh Noe, MD  Multiple Vitamin (MULTIVITAMIN) tablet Take 1 tablet by mouth daily.     [provider]  Oxycodone HCl 10 MG TABS Take 1 tablet (10 mg total) by mouth 2 (two) times daily as needed. 04/07/20   Lesleigh Noe, MD  Oxycodone HCl 10 MG TABS Take 1 tablet (10 mg total) by mouth 2 (two) times daily as needed. 03/07/20   Lesleigh Noe, MD  Oxycodone HCl 20 MG TABS Take 0.5 tablets (10 mg total) by mouth every 8 (eight) hours as needed. 03/10/20   Lesleigh Noe, MD  cyclobenzaprine (FLEXERIL) 10 MG tablet Take 1 tablet (10 mg total) by mouth 3 (three) times daily as needed for muscle spasms. 09/14/19   Elby Beck, FNP  cyclobenzaprine (FLEXERIL) 10 MG tablet TAKE 1 TABLET BY MOUTH THREE TIMES A DAY AS NEEDED FOR MUSCLE SPASMS 11/07/19   Lesleigh Noe, MD    Allergies    Bee venom and Hydrocodone  Review of Systems   Review of Systems  Constitutional: Positive for chills and fever.  HENT: Negative.   Respiratory: Negative.   Cardiovascular: Negative.   Gastrointestinal: Negative.  Negative for abdominal pain and nausea.  Genitourinary: Negative.  Negative for difficulty urinating and enuresis.  Musculoskeletal: Positive for back pain.       Left hip pain  Skin: Negative.  Negative for color change and wound.  Neurological: Negative.  Negative for weakness and numbness.    Physical Exam Updated Vital Signs BP (!) 138/94 (BP Location: Left Arm)   Pulse  100   Temp (!) 100.9 F (38.3 C) (Oral)   Resp 20   Ht 6' (1.829 m)   Wt 104.3 kg   SpO2 100%   BMI 31.19 kg/m   Physical Exam Vitals and nursing note reviewed.  Constitutional:      Appearance: He is well-developed.  HENT:     Head: Normocephalic.  Cardiovascular:     Rate and Rhythm: Normal rate and regular rhythm.     Heart sounds: No murmur heard.   Pulmonary:     Effort: Pulmonary effort is normal.     Breath sounds: Normal breath sounds. No wheezing, rhonchi or rales.  Abdominal:     General: Bowel sounds are normal.  Palpations: Abdomen is soft.     Tenderness: There is no abdominal tenderness. There is no guarding or rebound.  Musculoskeletal:        General: Normal range of motion.     Cervical back: Normal range of motion and neck supple.     Comments: Midline lumbar surgical scar. No redness or swelling. Minimal tenderness of lumbar spine or paraspinal area. FROM LE's. Normal plantar and dorsiflexion strength bilaterally.   Skin:    General: Skin is warm and dry.     Findings: No rash.  Neurological:     Mental Status: He is alert and oriented to person, place, and time.     Deep Tendon Reflexes: Reflexes abnormal (Decreased popliteal reflex on left LE.).     ED Results / Procedures / Treatments   Labs (all labs ordered are listed, but only abnormal results are displayed) Labs Reviewed  CULTURE, BLOOD (ROUTINE X 2)  CULTURE, BLOOD (ROUTINE X 2)  URINE CULTURE  CBC WITH DIFFERENTIAL/PLATELET  COMPREHENSIVE METABOLIC PANEL  LACTIC ACID, PLASMA  LACTIC ACID, PLASMA  URINALYSIS, ROUTINE W REFLEX MICROSCOPIC    EKG None  Radiology DG Tibia/Fibula Left  Result Date: 04/09/2020 CLINICAL DATA:  Leg pain EXAM: LEFT TIBIA AND FIBULA - 2 VIEW COMPARISON:  None. FINDINGS: There is no evidence of fracture or other focal bone lesions. Soft tissues are unremarkable. IMPRESSION: Negative. Electronically Signed   By: Fidela Salisbury MD   On: 04/09/2020 15:03    DG Hip Unilat With Pelvis 2-3 Views Left  Result Date: 04/09/2020 CLINICAL DATA:  Left hip pain 1 week EXAM: DG HIP (WITH OR WITHOUT PELVIS) 2-3V LEFT COMPARISON:  None. FINDINGS: The patient is status post left total hip arthroplasty. No periprosthetic lucency or fracture is identified. There is also a right total hip arthroplasty. Mild sclerosis around both bilateral sacroiliac joints. IMPRESSION: Left total hip arthroplasty without complication. Electronically Signed   By: Prudencio Pair M.D.   On: 04/09/2020 15:03   DG FEMUR 1V LEFT  Result Date: 04/09/2020 CLINICAL DATA:  Left hip pain EXAM: LEFT FEMUR 1 VIEW COMPARISON:  None. FINDINGS: Single view radiograph of the left femur demonstrates surgical changes of left total hip arthroplasty. Arthroplasty components overlie the expected position. Mild stress buttressing noted adjacent to the a distal aspect of the femoral stem component. No suspicious lytic or blastic bone lesions. No acute fracture or dislocation. Soft tissues are unremarkable. IMPRESSION: 1. Postsurgical changes of left total hip arthroplasty. No evidence of hardware complication. 2. No acute fracture or dislocation. Electronically Signed   By: Fidela Salisbury MD   On: 04/09/2020 15:05    Procedures Procedures (including critical care time)  Medications Ordered in ED Medications  acetaminophen (TYLENOL) tablet 650 mg (650 mg Oral Given 04/09/20 1716)    ED Course  I have reviewed the triage vital signs and the nursing notes.  Pertinent labs & imaging results that were available during my care of the patient were reviewed by me and considered in my medical decision making (see chart for details).    MDM Rules/Calculators/A&P                          Patient to ED with severe low back and left hip pain for several days, fever x 2 days. Symptoms are recurrent.   Chart reviewed: In December 2020, he was diagnosed with a lumbar epidural abscess with cauda equina syndrome.  He was admitted  to hospital and had a decompressive laminectomy at L3/L4/L5.  blood cultures at the time showed MSSA he received IV nafcillin. He subsequently developed a decubitus wound after he was discharged home. He required readmission from 1/25 through 2/2 with sepsis, discovered to have a right psoas abscess as well as an infected decubitus ulcer. He had a bedside debridement of the ulcer by general surgery Donne Hazel) on 1/25, and drain the right psoas abscess by IR on 1/26  He returns tonight with symptoms similar to previous presentations, more pain in the hip, minimal tenderness of the back. Will obtain CT w/CM to evaluate for recurrent psoas abscess. MRI will be needed to appropriately evaluate the lumbar spine for recurrent epidural abscess.   Patient's CT negative for abscess. There is a mild leukocytosis, elevated CRP and sed rate. Concern for recurrent epidural abscess. Discussed with the hospitalist who requests he be transferred to [facility] where MRI is available tonight. Discussed with Dr. Betsey Holiday who accepts ED to ED transfer.    Final Clinical Impression(s) / ED Diagnoses Final diagnoses:  None   1. Febrile illness 2. Myositis 3. Left hip pain  Rx / DC Orders ED Discharge Orders    None       Charlann Lange, PA-C 04/10/20 0243    Davonna Belling, MD 04/17/20 [phone]

## 2020-04-09 NOTE — Telephone Encounter (Signed)
Spoke to pt's daughter, Ander Purpura (DPR), and she said her dad has not been feeling well since last Friday, 04/04/20. Overall weakness, to the point where he can't get out of bed today. 101 fever yesterday, 99.2 today. Pain in his left leg up to his hip. Daughter says he has blood in his urine that she thinks started yesterday. Due to his history, his symptoms and that he can't get out of bed, I suggested that he go to the ER by EMS. Daughter says she will call 911 now. She is afraid he is becoming septic again.

## 2020-04-10 ENCOUNTER — Telehealth: Payer: Self-pay | Admitting: Radiology

## 2020-04-10 ENCOUNTER — Emergency Department (HOSPITAL_COMMUNITY): Payer: BC Managed Care – PPO

## 2020-04-10 ENCOUNTER — Inpatient Hospital Stay (HOSPITAL_COMMUNITY): Payer: BC Managed Care – PPO

## 2020-04-10 ENCOUNTER — Encounter (HOSPITAL_COMMUNITY): Payer: Self-pay

## 2020-04-10 DIAGNOSIS — T8452XA Infection and inflammatory reaction due to internal left hip prosthesis, initial encounter: Secondary | ICD-10-CM | POA: Diagnosis present

## 2020-04-10 DIAGNOSIS — Z87891 Personal history of nicotine dependence: Secondary | ICD-10-CM | POA: Diagnosis not present

## 2020-04-10 DIAGNOSIS — M25552 Pain in left hip: Secondary | ICD-10-CM | POA: Diagnosis not present

## 2020-04-10 DIAGNOSIS — B9561 Methicillin susceptible Staphylococcus aureus infection as the cause of diseases classified elsewhere: Secondary | ICD-10-CM | POA: Diagnosis present

## 2020-04-10 DIAGNOSIS — L0231 Cutaneous abscess of buttock: Secondary | ICD-10-CM | POA: Diagnosis present

## 2020-04-10 DIAGNOSIS — Z807 Family history of other malignant neoplasms of lymphoid, hematopoietic and related tissues: Secondary | ICD-10-CM | POA: Diagnosis not present

## 2020-04-10 DIAGNOSIS — F329 Major depressive disorder, single episode, unspecified: Secondary | ICD-10-CM | POA: Diagnosis present

## 2020-04-10 DIAGNOSIS — M009 Pyogenic arthritis, unspecified: Secondary | ICD-10-CM | POA: Diagnosis present

## 2020-04-10 DIAGNOSIS — E871 Hypo-osmolality and hyponatremia: Secondary | ICD-10-CM | POA: Diagnosis present

## 2020-04-10 DIAGNOSIS — Z20822 Contact with and (suspected) exposure to covid-19: Secondary | ICD-10-CM | POA: Diagnosis present

## 2020-04-10 DIAGNOSIS — Z96641 Presence of right artificial hip joint: Secondary | ICD-10-CM | POA: Diagnosis present

## 2020-04-10 DIAGNOSIS — Z8661 Personal history of infections of the central nervous system: Secondary | ICD-10-CM | POA: Diagnosis not present

## 2020-04-10 DIAGNOSIS — Y831 Surgical operation with implant of artificial internal device as the cause of abnormal reaction of the patient, or of later complication, without mention of misadventure at the time of the procedure: Secondary | ICD-10-CM | POA: Diagnosis present

## 2020-04-10 DIAGNOSIS — K6812 Psoas muscle abscess: Secondary | ICD-10-CM | POA: Diagnosis present

## 2020-04-10 DIAGNOSIS — M4628 Osteomyelitis of vertebra, sacral and sacrococcygeal region: Secondary | ICD-10-CM | POA: Diagnosis present

## 2020-04-10 DIAGNOSIS — R7881 Bacteremia: Secondary | ICD-10-CM | POA: Diagnosis present

## 2020-04-10 DIAGNOSIS — F419 Anxiety disorder, unspecified: Secondary | ICD-10-CM | POA: Diagnosis present

## 2020-04-10 DIAGNOSIS — Z825 Family history of asthma and other chronic lower respiratory diseases: Secondary | ICD-10-CM | POA: Diagnosis not present

## 2020-04-10 DIAGNOSIS — Z79899 Other long term (current) drug therapy: Secondary | ICD-10-CM | POA: Diagnosis not present

## 2020-04-10 DIAGNOSIS — M8618 Other acute osteomyelitis, other site: Secondary | ICD-10-CM | POA: Diagnosis present

## 2020-04-10 DIAGNOSIS — T8452XD Infection and inflammatory reaction due to internal left hip prosthesis, subsequent encounter: Secondary | ICD-10-CM | POA: Diagnosis not present

## 2020-04-10 DIAGNOSIS — Z8249 Family history of ischemic heart disease and other diseases of the circulatory system: Secondary | ICD-10-CM | POA: Diagnosis not present

## 2020-04-10 DIAGNOSIS — M00052 Staphylococcal arthritis, left hip: Secondary | ICD-10-CM | POA: Diagnosis present

## 2020-04-10 DIAGNOSIS — N179 Acute kidney failure, unspecified: Secondary | ICD-10-CM | POA: Diagnosis present

## 2020-04-10 HISTORY — PX: IR FLUORO GUIDED NEEDLE PLC ASPIRATION/INJECTION LOC: IMG2395

## 2020-04-10 LAB — URINALYSIS, ROUTINE W REFLEX MICROSCOPIC
Bilirubin Urine: NEGATIVE
Cellular Cast, UA: 4
Glucose, UA: NEGATIVE mg/dL
Ketones, ur: NEGATIVE mg/dL
Leukocytes,Ua: NEGATIVE
Nitrite: NEGATIVE
Protein, ur: 100 mg/dL — AB
RBC / HPF: >50 RBC/hpf — ABNORMAL HIGH (ref 0–5)
Specific Gravity, Urine: 1.04 — ABNORMAL HIGH (ref 1.005–1.030)
pH: 5 (ref 5.0–8.0)

## 2020-04-10 LAB — BLOOD CULTURE ID PANEL (REFLEXED) - BCID2

## 2020-04-10 LAB — BASIC METABOLIC PANEL
Anion gap: 12 (ref 5–15)
BUN: 26 mg/dL — ABNORMAL HIGH (ref 6–20)
CO2: 23 mmol/L (ref 22–32)
Calcium: 9.3 mg/dL (ref 8.9–10.3)
Chloride: 101 mmol/L (ref 98–111)
Creatinine, Ser: 1.26 mg/dL — ABNORMAL HIGH (ref 0.61–1.24)
GFR calc Af Amer: >60 mL/min (ref >60)
GFR calc non Af Amer: >60 mL/min (ref >60)
Glucose, Bld: 97 mg/dL (ref 70–99)
Potassium: 4.1 mmol/L (ref 3.5–5.1)
Sodium: 136 mmol/L (ref 135–145)

## 2020-04-10 LAB — CBC WITH DIFFERENTIAL/PLATELET
Abs Immature Granulocytes: 0.06 10*3/uL (ref 0.00–0.07)
Basophils Absolute: 0 10*3/uL (ref 0.0–0.1)
Basophils Relative: 0 %
Eosinophils Absolute: 0 10*3/uL (ref 0.0–0.5)
Eosinophils Relative: 0 %
HCT: 39.9 % (ref 39.0–52.0)
Hemoglobin: 13 g/dL (ref 13.0–17.0)
Immature Granulocytes: 1 %
Lymphocytes Relative: 8 %
Lymphs Abs: 0.9 10*3/uL (ref 0.7–4.0)
MCH: 31.3 pg (ref 26.0–34.0)
MCHC: 32.6 g/dL (ref 30.0–36.0)
MCV: 95.9 fL (ref 80.0–100.0)
Monocytes Absolute: 0.9 10*3/uL (ref 0.1–1.0)
Monocytes Relative: 8 %
Neutro Abs: 8.9 10*3/uL — ABNORMAL HIGH (ref 1.7–7.7)
Neutrophils Relative %: 83 %
Platelets: 337 10*3/uL (ref 150–400)
RBC: 4.16 MIL/uL — ABNORMAL LOW (ref 4.22–5.81)
RDW: 14 % (ref 11.5–15.5)
WBC: 10.7 10*3/uL — ABNORMAL HIGH (ref 4.0–10.5)
nRBC: 0 % (ref 0.0–0.2)

## 2020-04-10 LAB — SYNOVIAL CELL COUNT + DIFF, W/ CRYSTALS
Crystals, Fluid: NONE SEEN
Eosinophils-Synovial: 0 % (ref 0–1)
Lymphocytes-Synovial Fld: 23 % — ABNORMAL HIGH (ref 0–20)
Monocyte-Macrophage-Synovial Fluid: 14 % — ABNORMAL LOW (ref 50–90)
Neutrophil, Synovial: 63 % — ABNORMAL HIGH (ref 0–25)
WBC, Synovial: 2835 /mm3 — ABNORMAL HIGH (ref 0–200)

## 2020-04-10 LAB — LACTIC ACID, PLASMA: Lactic Acid, Venous: 1.3 mmol/L (ref 0.5–1.9)

## 2020-04-10 LAB — SARS CORONAVIRUS 2 BY RT PCR (HOSPITAL ORDER, PERFORMED IN ~~LOC~~ HOSPITAL LAB): SARS Coronavirus 2: NEGATIVE

## 2020-04-10 LAB — SEDIMENTATION RATE: Sed Rate: 78 mm/hr — ABNORMAL HIGH (ref 0–16)

## 2020-04-10 LAB — C-REACTIVE PROTEIN: CRP: 20.5 mg/dL — ABNORMAL HIGH (ref <1.0)

## 2020-04-10 IMAGING — XA IR FLUORO GUIDE NDL PLMT / BX
3 series · 5 of 5 positions shown · non-contrast
Comparison: none

INDICATION: 53-year-old male with a history of bilateral hip replacements and
prior lumbar osteomyelitis-discitis. He presents with left hip pain
and complex left hip joint effusion concerning for septic arthritis.
He presents for aspiration.

[Series 1: ir fluoro guide ndl plmt / bx · 2 of 2 slices shown]
[im 1/2]
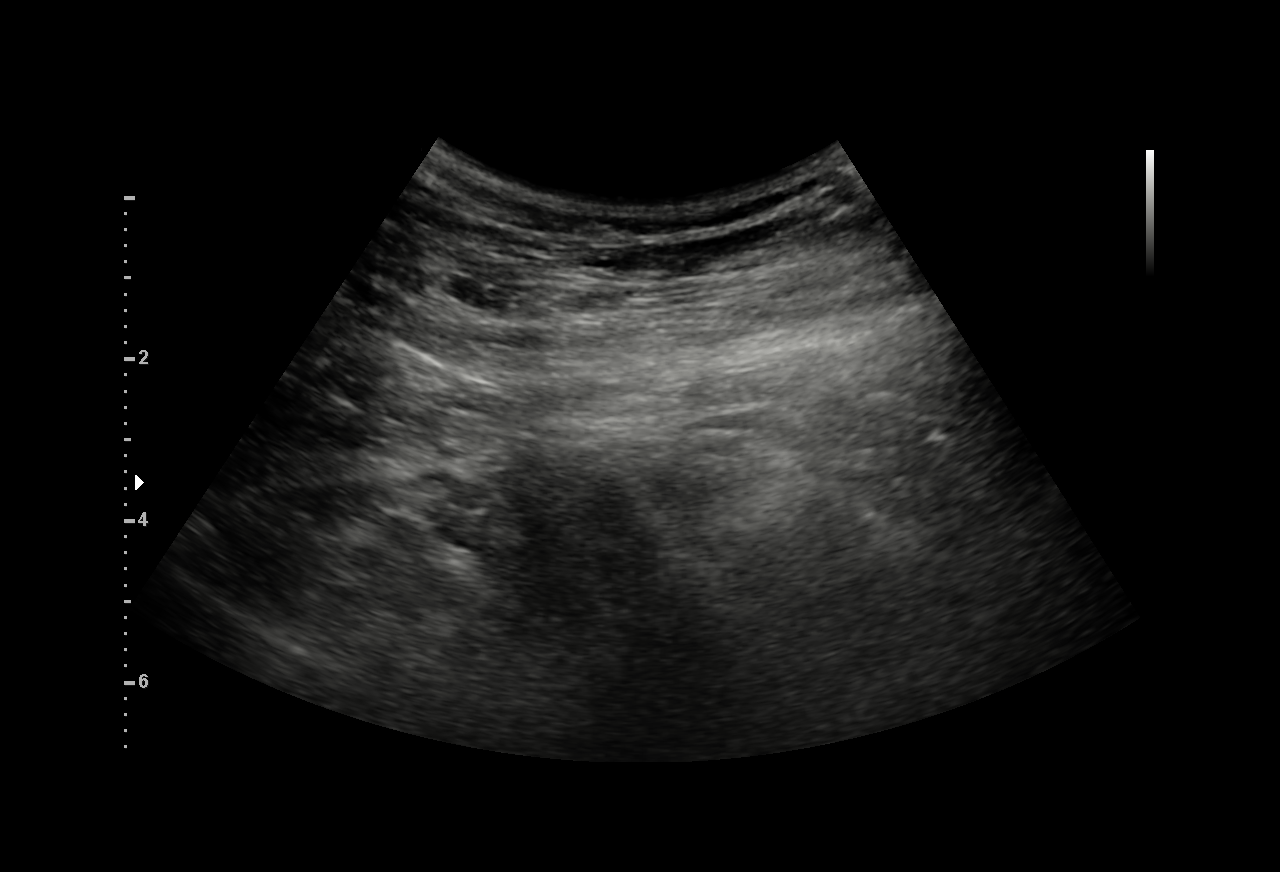
[im 2/2]
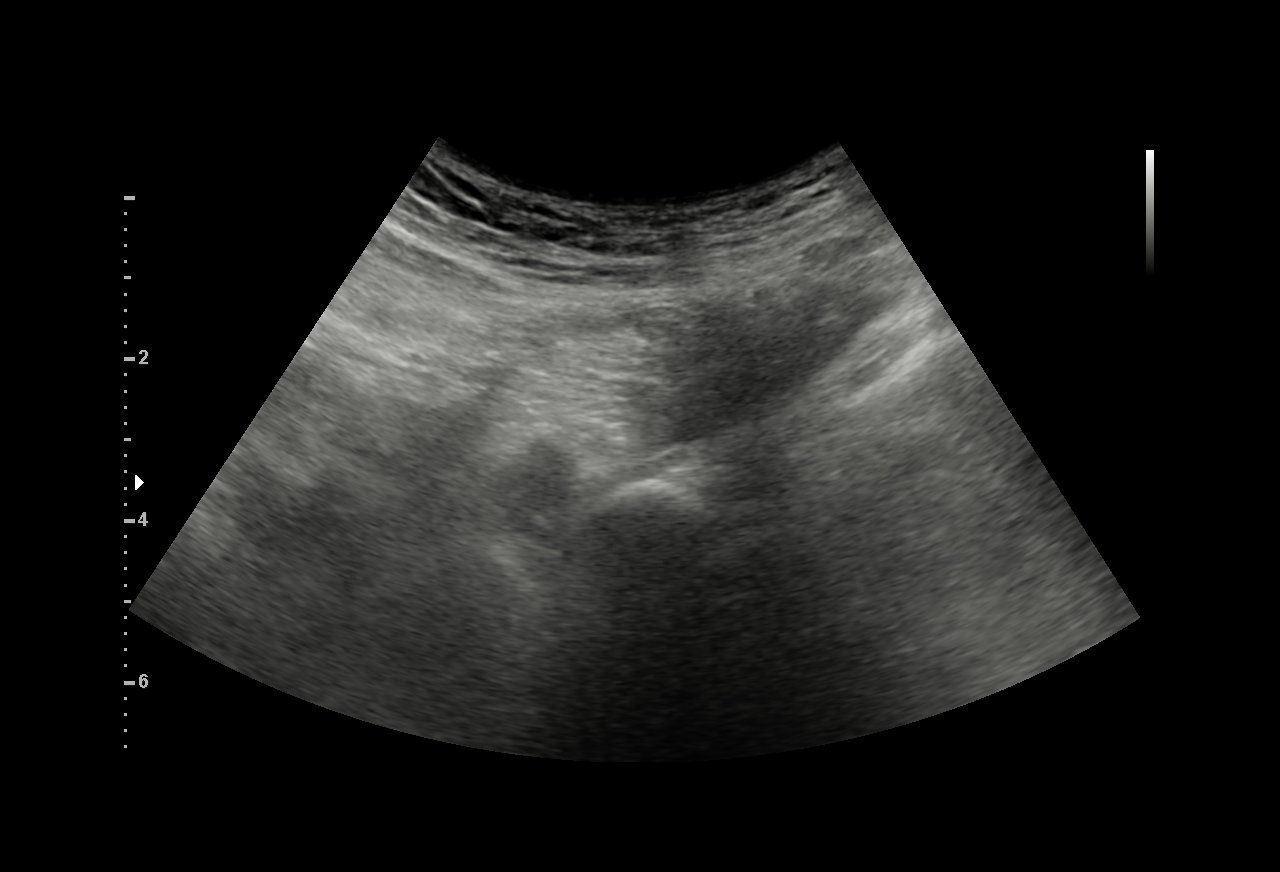

[Series 1: fl (-) angio · 2 of 2 slices shown (1 of 2)]
[im 1/2]
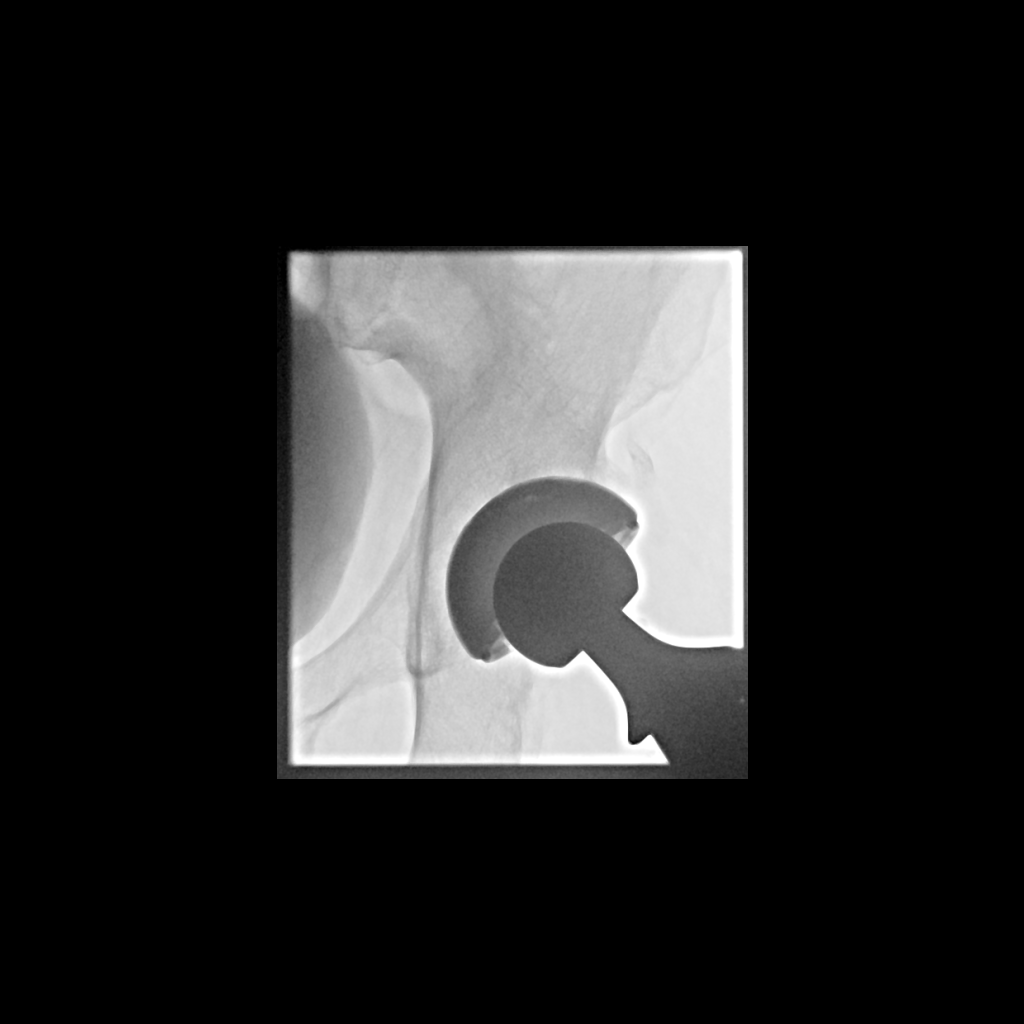
[im 2/2]
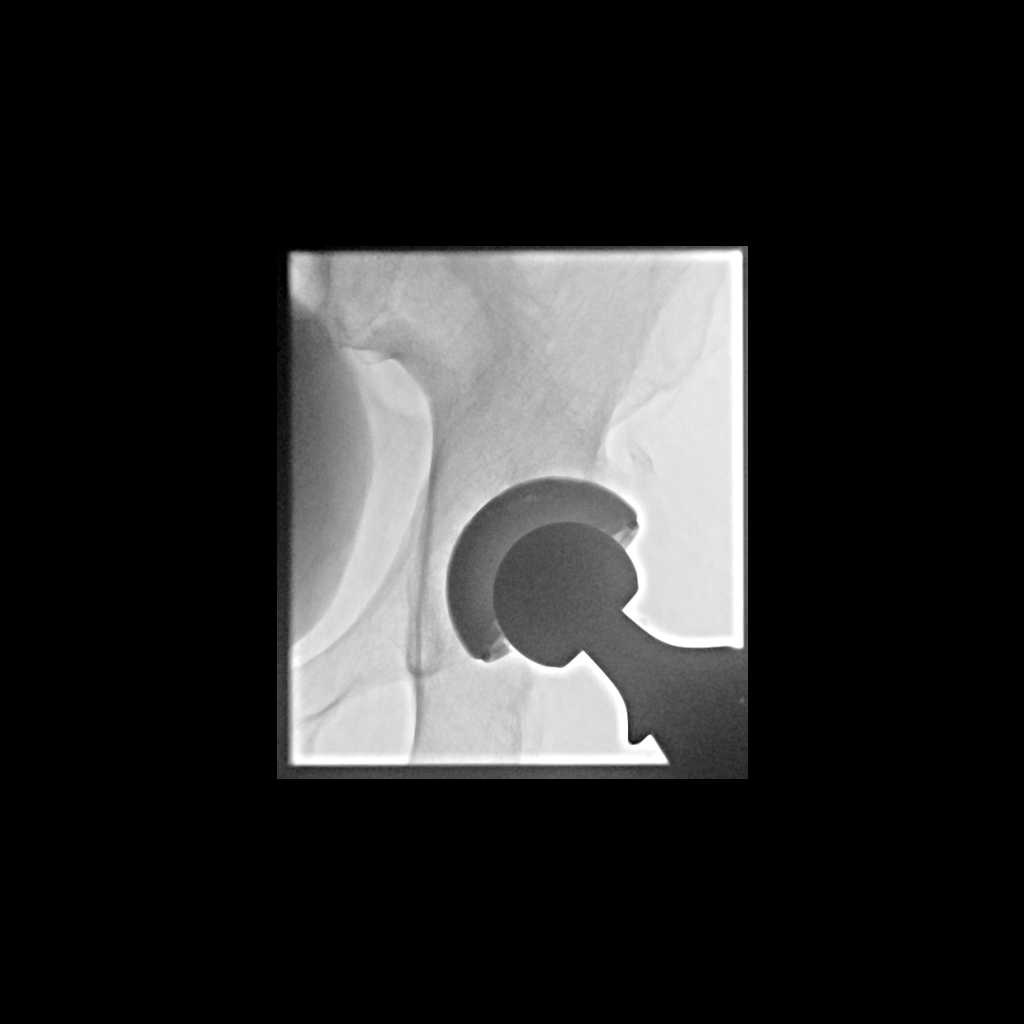

[Series 2: fl (-) angio · 1 of 1 slices shown (2 of 2)]
[im 1/1]
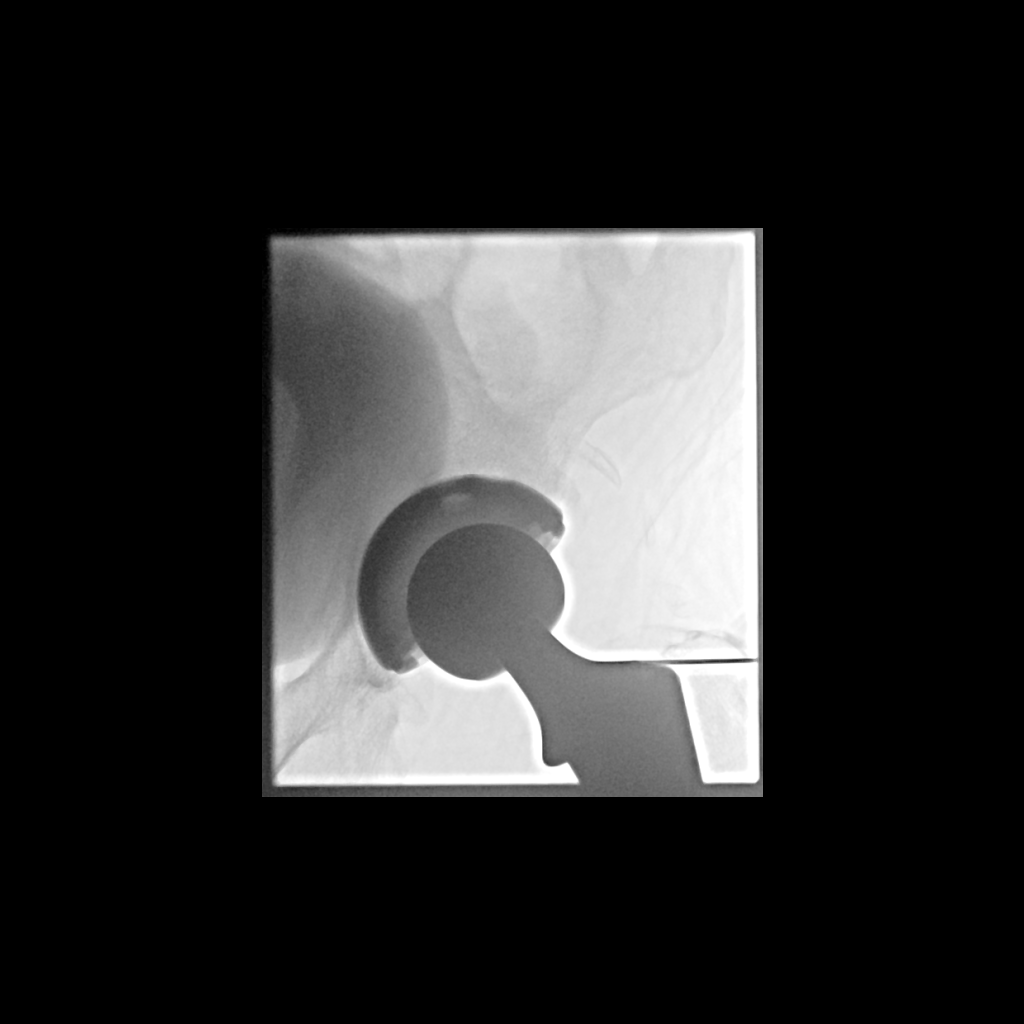

[5 of 5 positions shown; findings below may reference images not displayed]

EXAM:
Left hip joint aspiration

MEDICATIONS:
The patient is currently admitted to the hospital and receiving
intravenous antibiotics. The antibiotics were administered within an
appropriate time frame prior to the initiation of the procedure.

ANESTHESIA/SEDATION:
None.

COMPLICATIONS:
None immediate.

PROCEDURE:
Informed written consent was obtained from the patient after a
thorough discussion of the procedural risks, benefits and
alternatives. All questions were addressed. Maximal Sterile Barrier
Technique was utilized including caps, mask, sterile gowns, sterile
gloves, sterile drape, hand hygiene and skin antiseptic. A timeout
was performed prior to the initiation of the procedure.

Local anesthesia was attained by infiltration with 1% lidocaine. An
18 gauge trocar needle was carefully advanced through the soft
tissues and into the joint space. Aspiration was then performed
yielding approximately 60 mL of turbid yellow synovial fluid.
Samples were sent for Gram stain, culture, cell count and crystals.
IMPRESSION: Successful left hip joint aspiration yielding approximately 60 mL of
turbid golden synovial fluid.

## 2020-04-10 IMAGING — MR MR HIP*L* WO/W CM
8 of 11 series · 30 of 40 positions shown · IV contrast (gadavist)
Comparison: CT abdomen pelvis from same day. Left hip x-rays from
yesterday. MRI left hip dated [DATE].

CLINICAL DATA: Left hip and leg pain for the past week. Concern for
septic arthritis. History of lumbar osteomyelitis-discitis. Prior
bilateral hip arthroplasties.

EXAM:
MRI OF THE LEFT HIP WITHOUT AND WITH CONTRAST
TECHNIQUE: Multiplanar, multisequence MR imaging was performed both before and
after administration of intravenous contrast.
CONTRAST:  10mL GADAVIST GADOBUTROL 1 MMOL/ML IV SOLN

[Series 4: t1_tse_cor_high-bw_unilat · coronal · left · 3.0mm · 1.31mm/px · 4 of 30 slices shown]
[im 1/30]
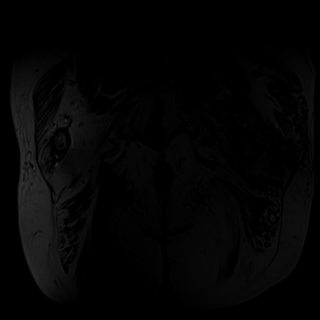
[im 10/30]
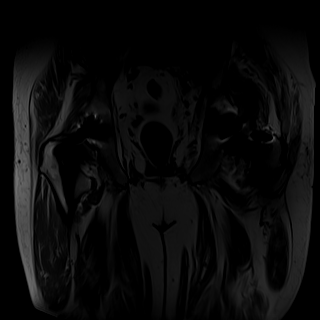
[im 20/30]
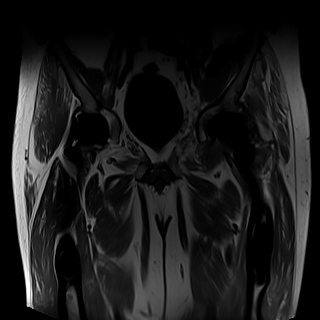
[im 30/30]
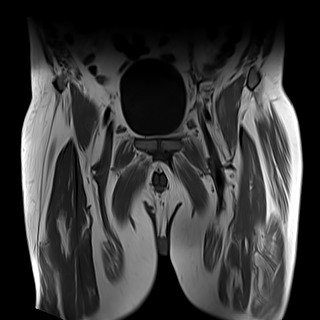

[Series 5: t2_tse_stir_cor_high-bw_unilat · coronal · left · 3.0mm · 1.64mm/px · 4 of 30 slices shown]
[im 1/30]
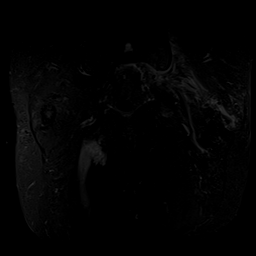
[im 10/30]
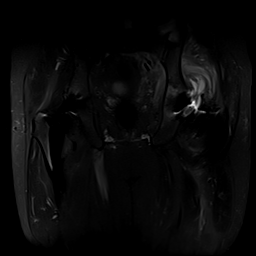
[im 20/30]
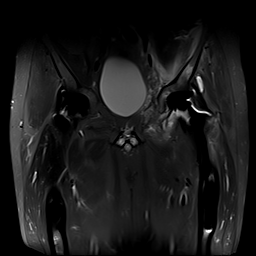
[im 30/30]
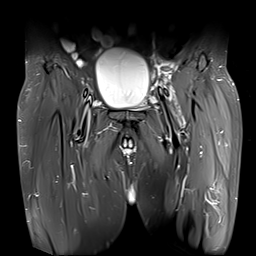

[Series 6: T2 fat-sat · axial · left · 4.0mm · 0.35mm/px · z∈[-144,+26]mm · 3 of 35 slices shown]
[im 1/35]
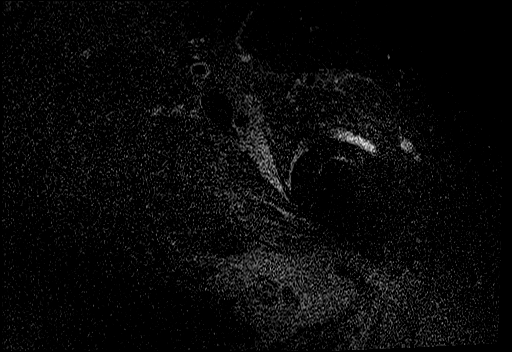
[im 18/35]
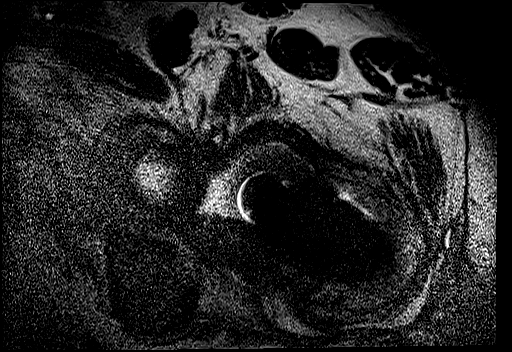
[im 35/35]
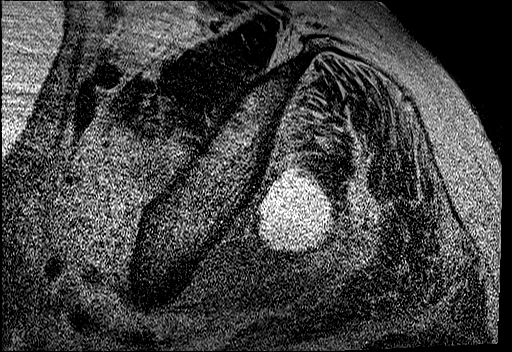

[Series 7: t2_tse_tra_high-bw_bilat · axial · left · 2.5mm · 0.94mm/px · z∈[-97,+38]mm · 4 of 46 slices shown (1 of 2)]
[im 1/46]
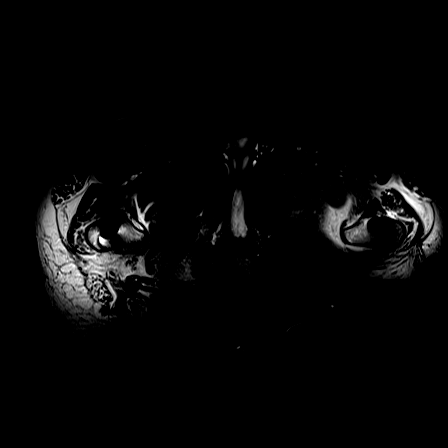
[im 16/46]
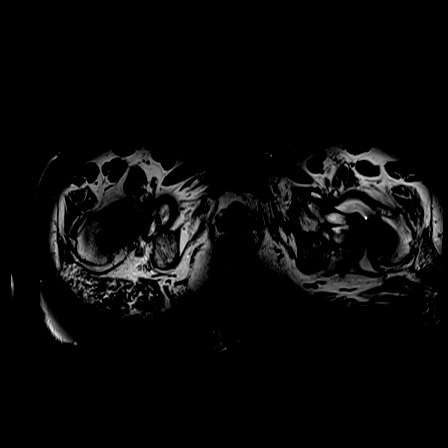
[im 31/46]
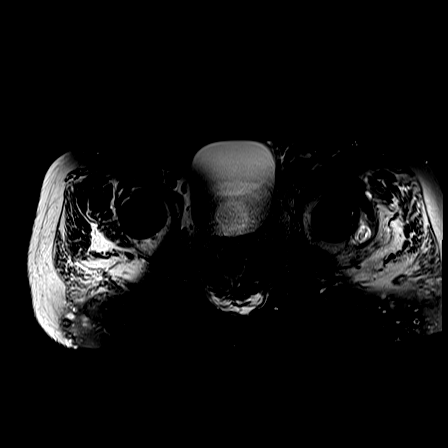
[im 46/46]
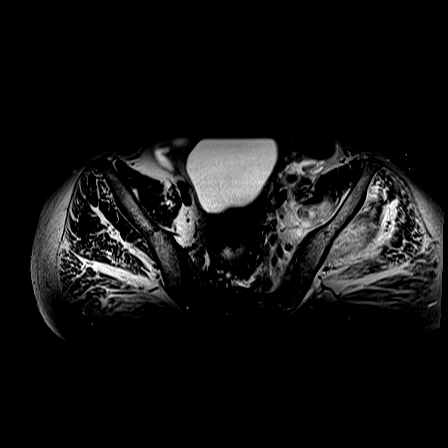

[Series 8: pd_tse_cor_semac_unilat · sagittal · left · 4.0mm · 0.88mm/px · 3 of 34 slices shown]
[im 1/34]
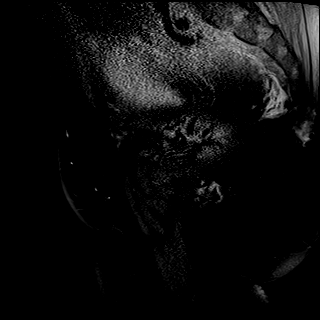
[im 17/34]
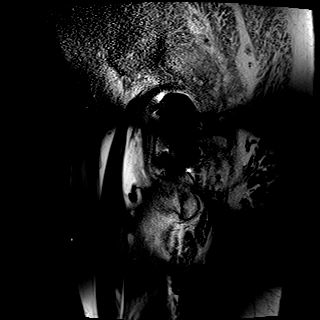
[im 34/34]
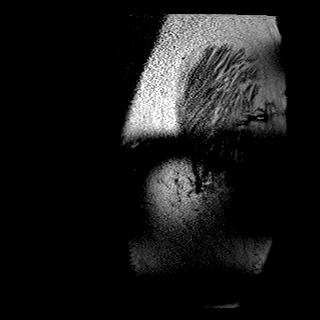

[Series 9: t2_tse_tra_high-bw_bilat · axial · left · 2.5mm · 0.94mm/px · z∈[-97,+38]mm · 4 of 45 slices shown (2 of 2)]
[im 1/45]
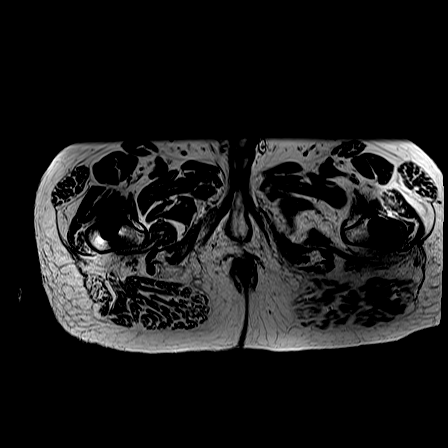
[im 15/45]
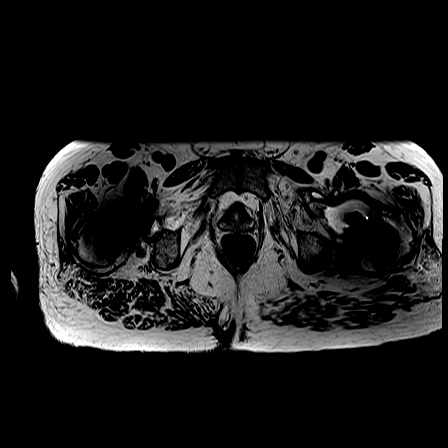
[im 30/45]
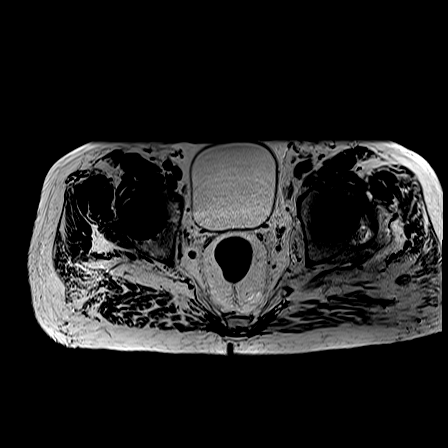
[im 45/45]
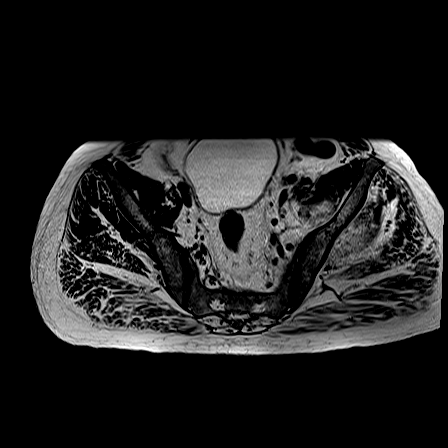

[Series 14: T1 fat-sat post-contrast · axial · left · 4.0mm · 0.89mm/px · z∈[-157,+38]mm · 4 of 40 slices shown (1 of 2)]
[im 1/40]
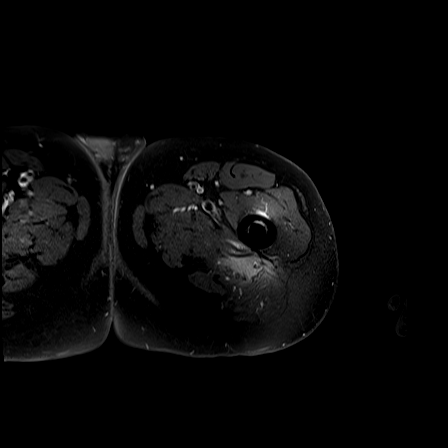
[im 14/40]
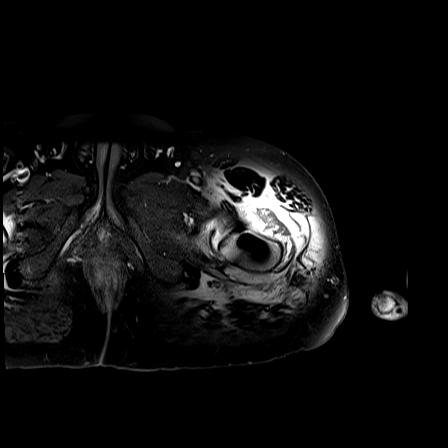
[im 27/40]
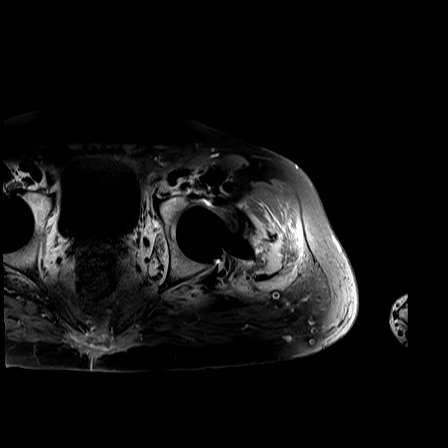
[im 40/40]
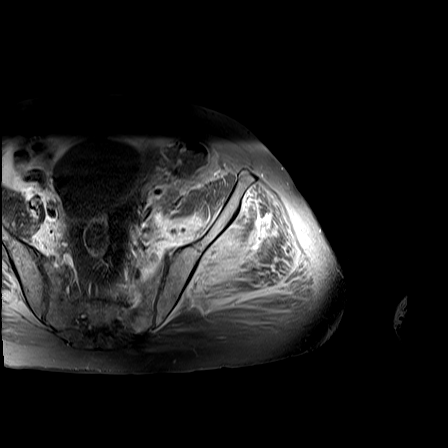

[Series 17: T1 fat-sat post-contrast · coronal · left · 4.0mm · 0.80mm/px · 4 of 40 slices shown (2 of 2)]
[im 1/40]
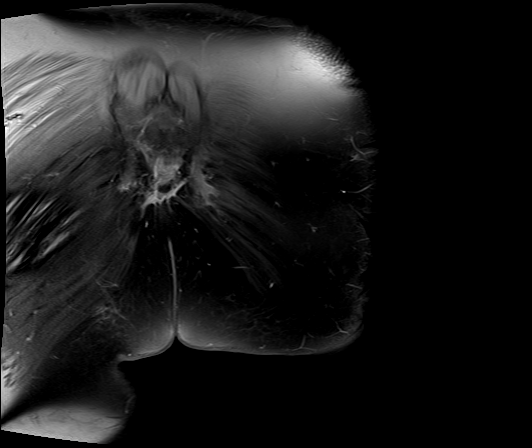
[im 14/40]
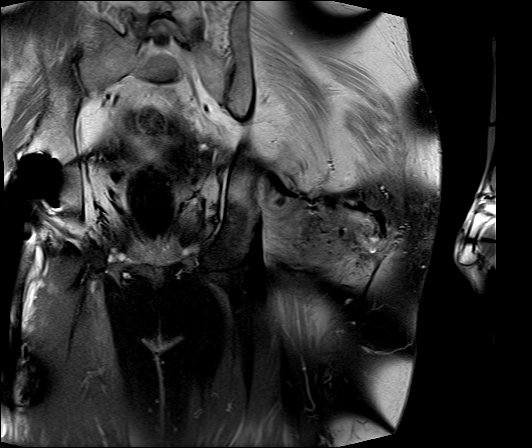
[im 27/40]
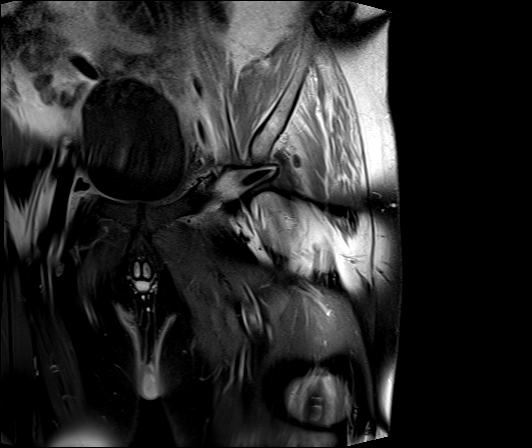
[im 40/40]
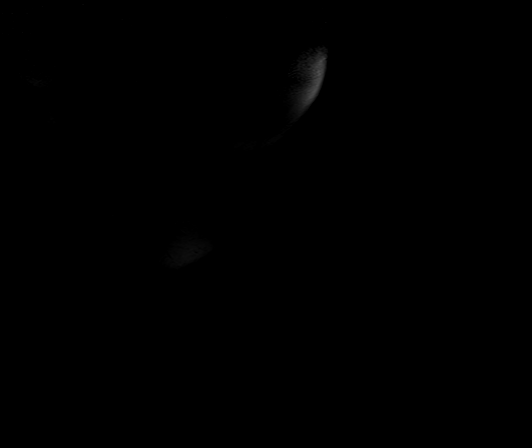

[30 of 40 positions shown; findings below may reference images not displayed]

FINDINGS: Bones: Prior bilateral total hip arthroplasties with associated
susceptibility artifact. Focal marrow edema in the left medial
acetabulum with corresponding decreased T1 marrow signal (series 4
and 5, images 10-15). There is no evidence of acute fracture,
dislocation or avascular necrosis. No focal bone lesion. The
visualized sacroiliac joints and symphysis pubis appear normal.

Joint or bursal effusion

Joint effusion: Large left hip joint effusion extending into the
left gluteus minimus muscle with prominent synovial thickening and
enhancement. No right hip joint effusion.

Bursae: Small amount of fluid in the left greater trochanteric
bursa.

Muscles and tendons

Muscles and tendons: Prominent edema within the left gluteus
minimus, left piriformis and, and left iliacus muscles. Inflammatory
changes involve the greater sciatic foramen. Developing 3.1 cm
abscess in the left iliacus muscle (series 17, image 21).

The visualized gluteus, hamstring and iliopsoas tendons are intact.
Partial tear of the right adductor magnus tendon near the ischial
tuberosity with surrounding edema.

Other findings

Miscellaneous: The visualized internal pelvic contents appear
unremarkable.
IMPRESSION: 1. Left hip septic arthritis.
2. Marrow signal changes in the left medial acetabulum concerning
for osteomyelitis.
3. Left gluteus minimus, left piriformis, and left iliacus myositis
with developing 3.1 cm abscess in the left iliacus muscle.
4. Mild left greater trochanteric bursitis.
5. Partial tear of the right adductor magnus tendon near the ischial
tuberosity.

## 2020-04-10 IMAGING — MR MR LUMBAR SPINE WO/W CM
4 of 7 series · 22 of 48 positions shown · IV contrast (gadavist)
Comparison: Lumbar spine MRI [DATE]. CT abdomen/pelvis
[DATE].

CLINICAL DATA: Low back pain, infection suspected. Additional
history provided: Left hip and leg pain for 1 week, history of
discitis/osteomyelitis, history of bilateral hip replacements.

EXAM:
MRI LUMBAR SPINE WITHOUT AND WITH CONTRAST
TECHNIQUE: Multiplanar and multiecho pulse sequences of the lumbar spine were
obtained without and with intravenous contrast.
CONTRAST:  10mL GADAVIST GADOBUTROL 1 MMOL/ML IV SOLN

[Series 5: T2 · sagittal · 4.0mm · 0.78mm/px · 5 of 16 slices shown (1 of 2)]
[im 1/16]
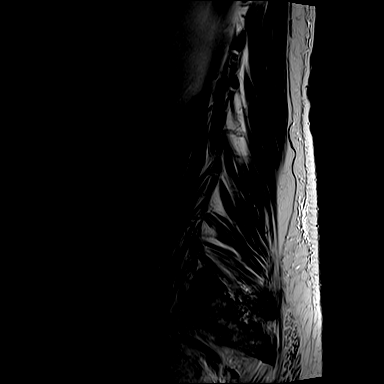
[im 4/16]
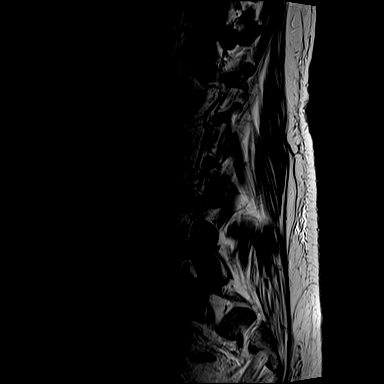
[im 8/16]
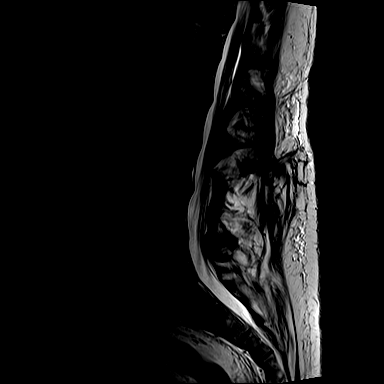
[im 12/16]
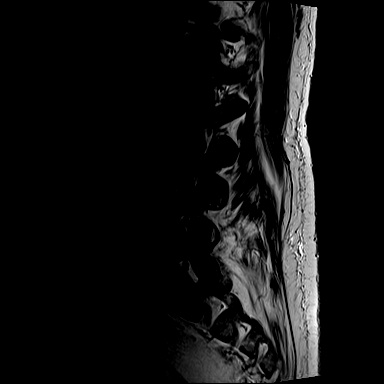
[im 16/16]
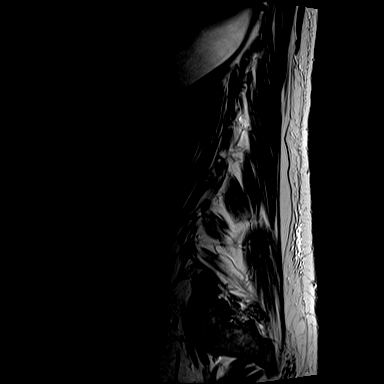

[Series 7: T1 · sagittal · 4.0mm · 0.88mm/px · 4 of 16 slices shown (1 of 2)]
[im 1/16]
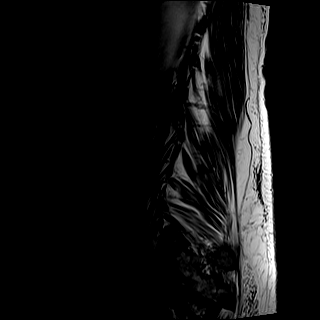
[im 6/16]
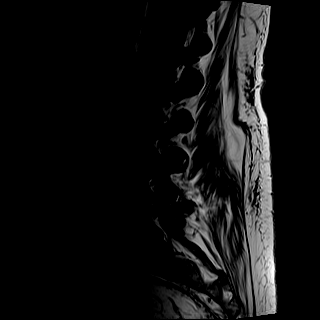
[im 11/16]
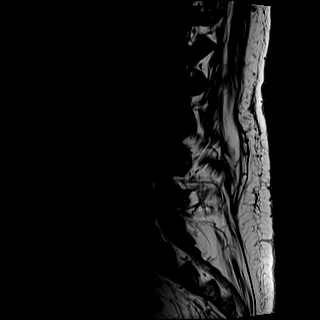
[im 16/16]
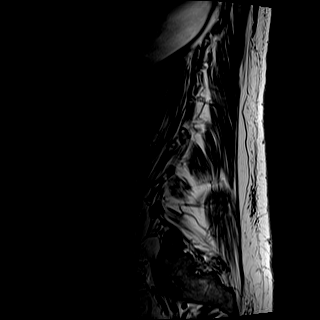

[Series 8: T2 · axial · 4.0mm · 0.57mm/px · z∈[-110,+85]mm · 8 of 40 slices shown (2 of 2)]
[im 1/40]
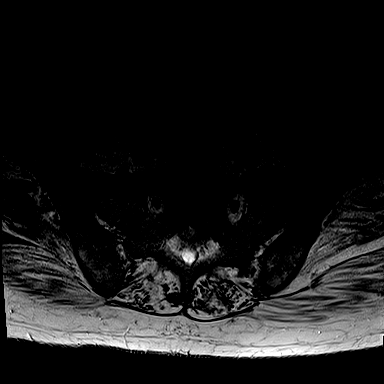
[im 5/40]
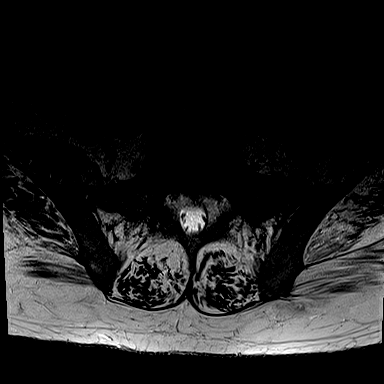
[im 14/40]
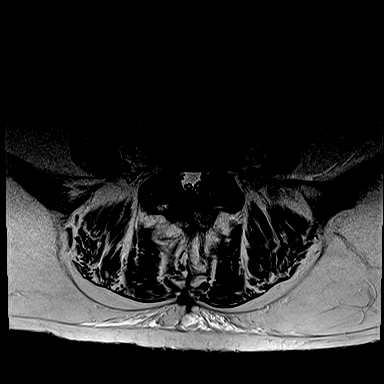
[im 18/40]
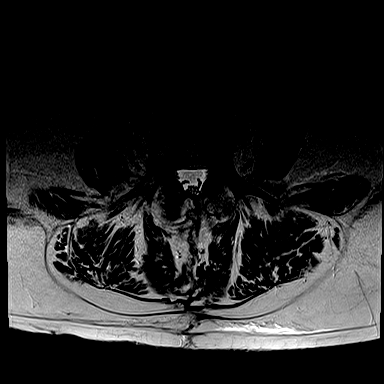
[im 22/40]
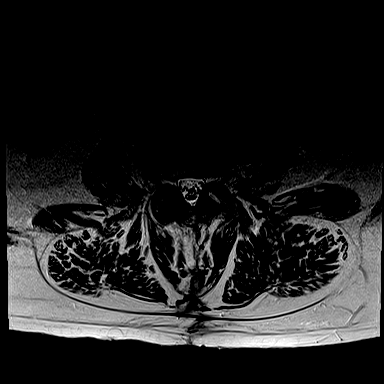
[im 27/40]
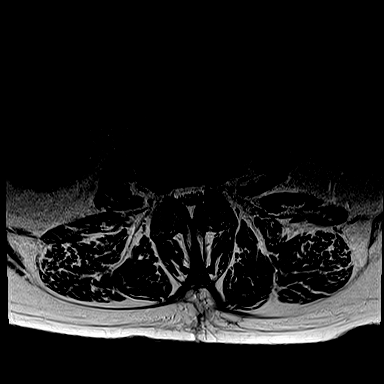
[im 35/40]
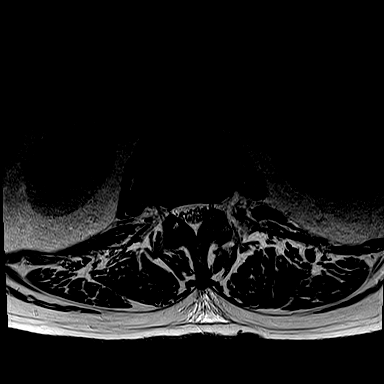
[im 40/40]
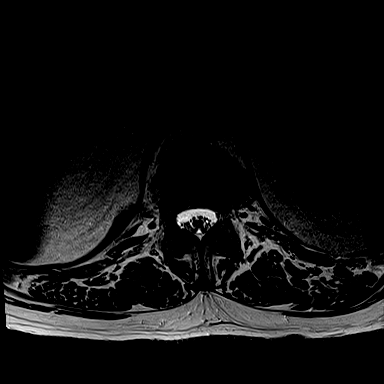

[Series 9: T1 · axial · 4.0mm · 0.34mm/px · z∈[-110,+60]mm · 5 of 40 slices shown (2 of 2)]
[im 1/40]
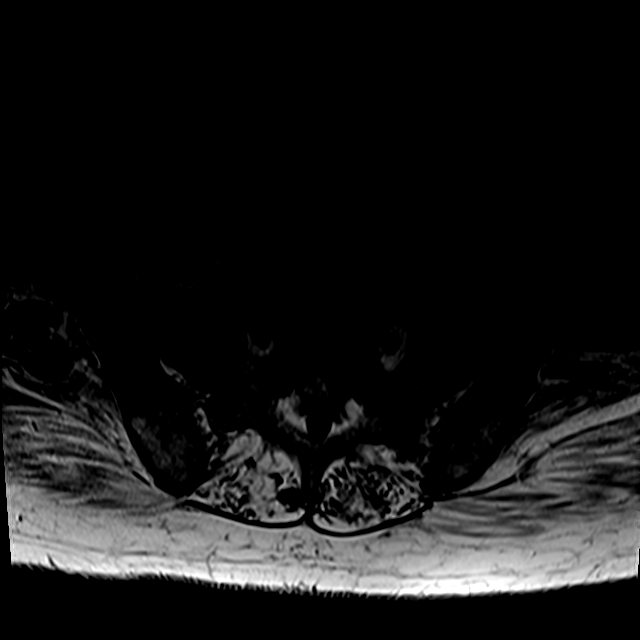
[im 5/40]
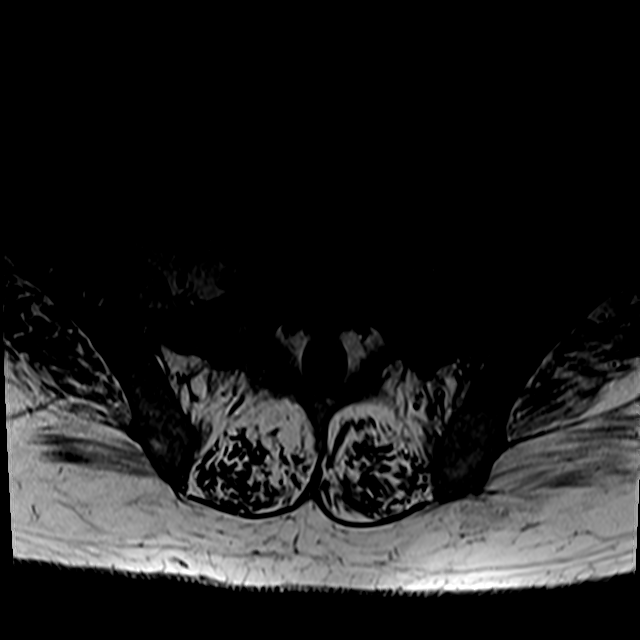
[im 14/40]
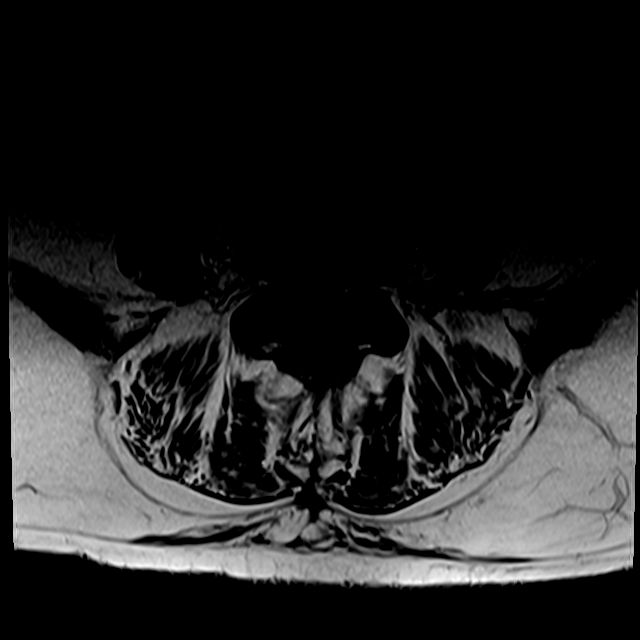
[im 22/40]
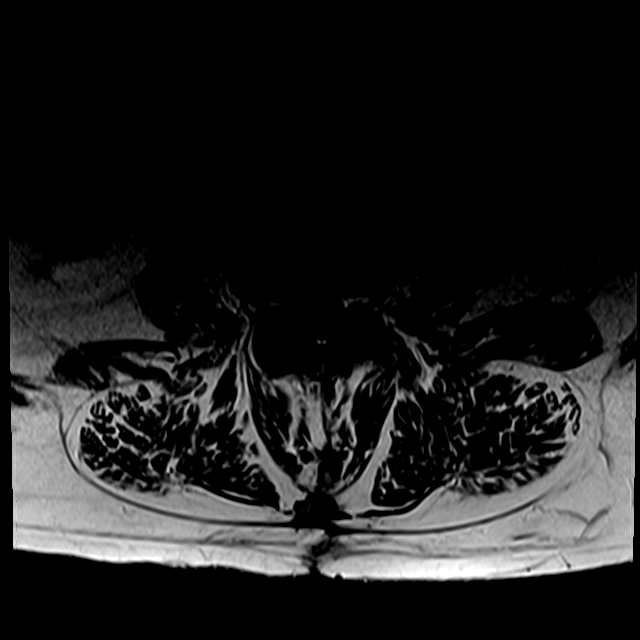
[im 35/40]
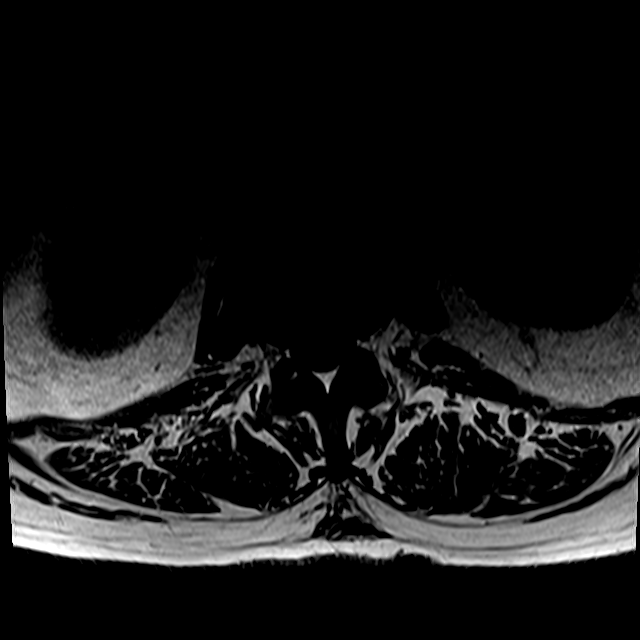

[22 of 48 positions shown; findings below may reference images not displayed]

FINDINGS: Segmentation: For the purposes of this dictation, five lumbar
vertebrae are assumed and the caudal most well-formed intervertebral
disc is designated L5-S1.

Alignment: Straightening of the expected lumbar lordosis. L3-L4 and
L4-L5 grade 1 retrolisthesis.

Vertebrae: Vertebral body height is maintained. Multilevel vertebral
body hemangiomas. Multilevel degenerative endplate irregularity with
small Schmorl nodes. There is incompletely imaged marrow edema
within the left sacrum and left iliac bone abutting the left SI
joint (series 6, image 16). These findings raise the possibility of
osteomyelitis at this site, possibly with associated sacroiliitis.
There is mild edema signal along the margins of the L3-L4 disc space
(for instance as seen on series 14, images 4-13). Prominent
multilevel ventrolateral osteophytes.

Conus medullaris and cauda equina: Conus extends to the L1-L2 level.
No signal abnormality within the visualized distal spinal cord.

Paraspinal and other soft tissues: There is prominent edema signal
and enhancement within the posterior aspect of the left iliopsoas
muscle (for instance as seen on series 8, image 40) (series 15,
image 38). These findings are consistent with infection/phlegmon.
Centrally within this region there are few tiny (subcentimeter) foci
of hypoenhancement which could reflect tiny abscesses (series 15,
image 18).

Disc levels:

Unless otherwise stated, the level by level findings below have not
significantly changed since prior MRI [DATE].

Multilevel disc degeneration. Most notably, there is moderate disc
degeneration at L3-L4.

L1-L2: Mild disc bulge and facet arthrosis. No significant spinal
canal stenosis or neural foraminal narrowing.

L2-L3: Mild disc bulging and facet arthrosis. No significant spinal
canal stenosis or neural foraminal narrowing.

L3-L4: Grade 1 retrolisthesis. Prior posterior decompression. Disc
bulge with endplate spurring. Facet hypertrophy. Mild bilateral
subarticular narrowing. Central canal patent. Mild bilateral
inferior neural foraminal narrowing. Unchanged mild nonspecific
fluid signal and enhancement within the facet joints bilaterally.

L4-L5: Grade 1 retrolisthesis. Prior posterior decompression. Disc
bulge with endplate spurring. Facet hypertrophy. Mild bilateral
subarticular narrowing. Mild bilateral neural foraminal narrowing.
Unchanged mild nonspecific fluid signal and enhancement within the
facet joints bilaterally.

L5-S1: Bilateral facet arthrosis (severe right, moderate left). No
significant disc herniation, spinal canal stenosis or neural
foraminal narrowing.
IMPRESSION: Incompletely imaged marrow edema within the left sacrum and iliac
bone, abutting the left sacroiliac joint. Findings raise the
possibility of osteomyelitis at this site, possibly with left
sacroiliitis. Consider contrast-enhanced MR imaging of the sacrum
and pelvis for further evaluation.

Partially imaged prominent edema and enhancement within the left
iliopsoas muscle consistent with infection/phlegmon. Centrally
within this region, there are several tiny (subcentimeter) foci of
relative hypoenhancement which may reflect tiny abscesses.

Unchanged subtle enhancement along the margins of the L3-L4 disc
space, nonspecific but possibly reflecting mild residual
discitis/osteomyelitis.

Mild fluid signal and enhancement within the bilateral L3-L4 and
L4-L5 facet joints. These findings are unchanged and also
nonspecific. Infection cannot be excluded at these sites.

Lumbar spondylosis as outlined and unchanged. No more than mild
spinal canal stenosis or neural foraminal narrowing.

## 2020-04-10 IMAGING — CT CT ABD-PELV W/ CM
2 of 5 series · 15 of 46 positions shown, 17 images · IV contrast (omnipaque)
Comparison: [DATE], [DATE], [DATE]

CLINICAL DATA: Left hip and leg pain for 1 week, history of
discitis/osteomyelitis

EXAM:
CT ABDOMEN AND PELVIS WITH CONTRAST
TECHNIQUE: Multidetector CT imaging of the abdomen and pelvis was performed
using the standard protocol following bolus administration of
intravenous contrast.
CONTRAST:  100mL OMNIPAQUE IOHEXOL 300 MG/ML  SOLN

[Series 2: axial st · axial · 0.82mm/px · z∈[-675,-275]mm · 12 of 96 slices shown, 14 images]
[im 8/96  soft-tissue]
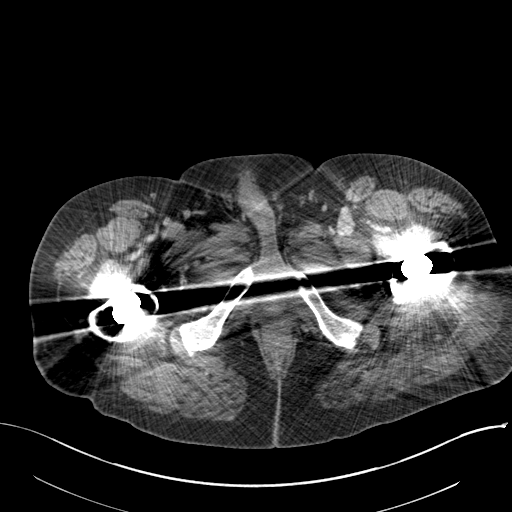
[im 8/96  bone]
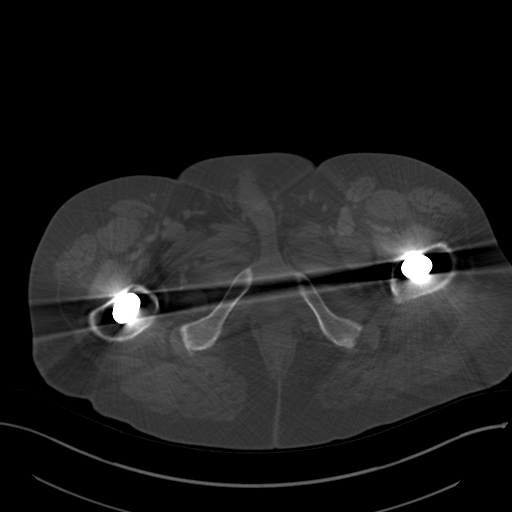
[im 15/96  soft-tissue]
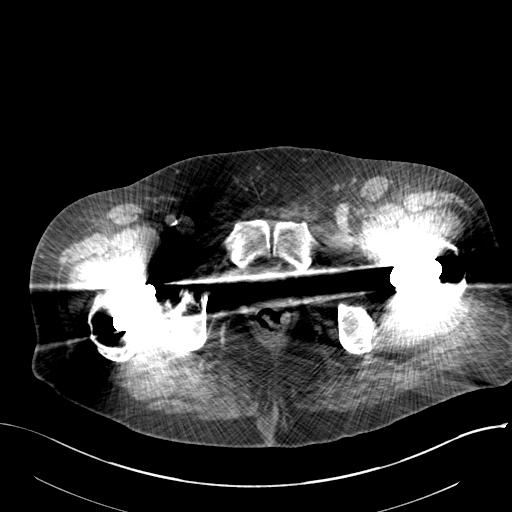
[im 22/96  soft-tissue]
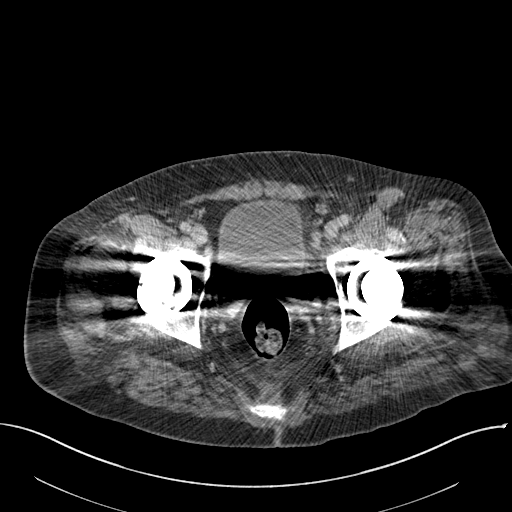
[im 30/96  soft-tissue]
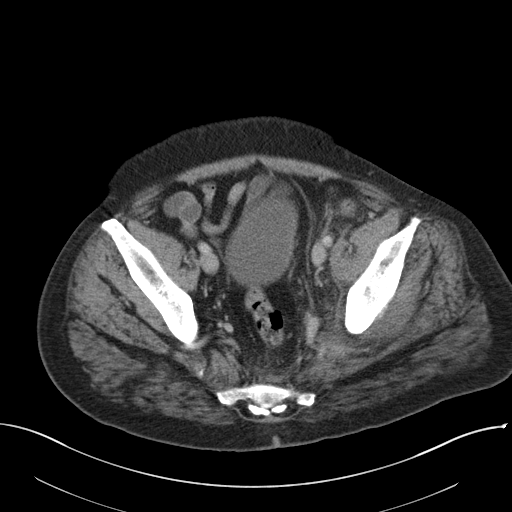
[im 37/96  soft-tissue]
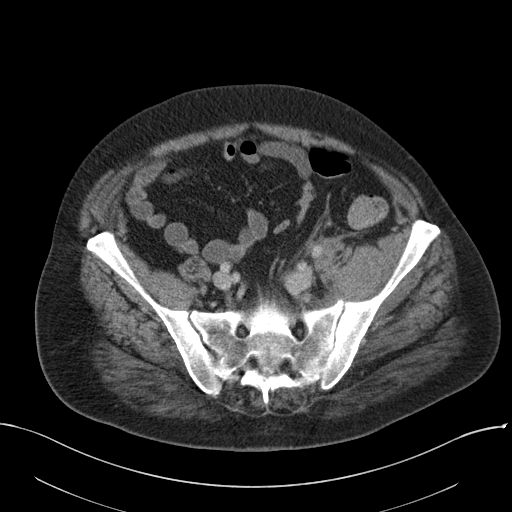
[im 44/96  soft-tissue]
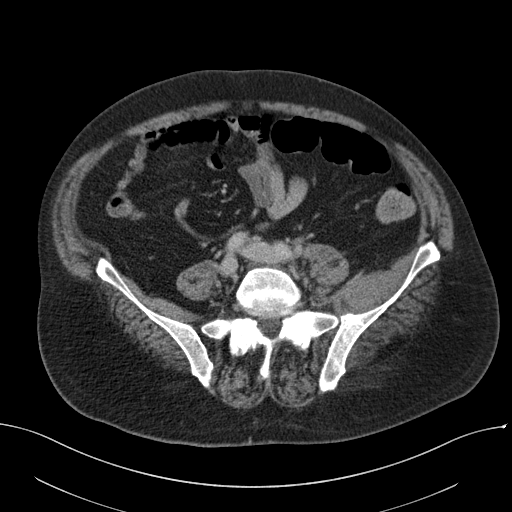
[im 52/96  soft-tissue]
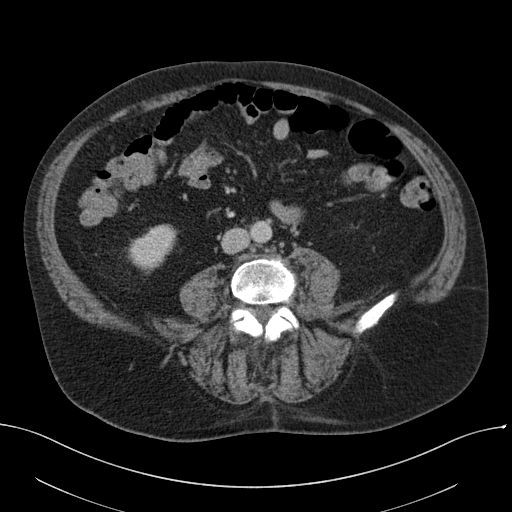
[im 59/96  soft-tissue]
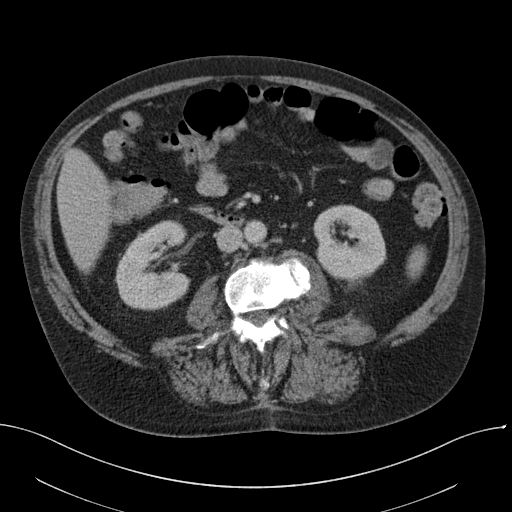
[im 66/96  soft-tissue]
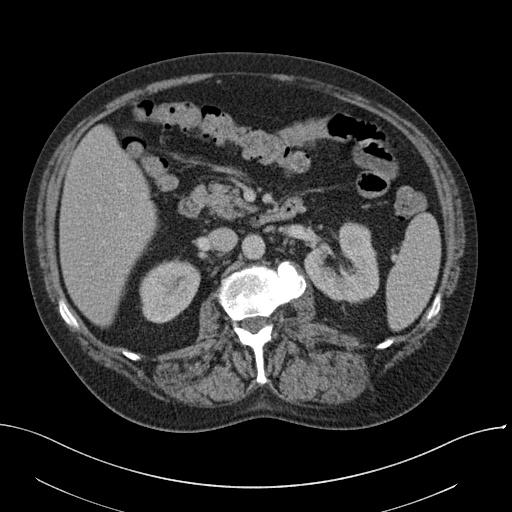
[im 66/96  bone]
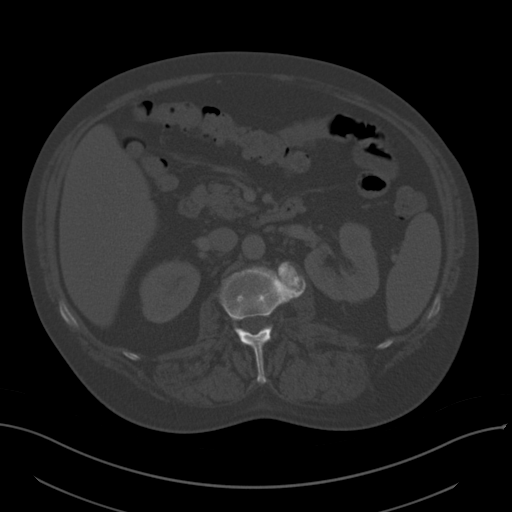
[im 74/96  soft-tissue]
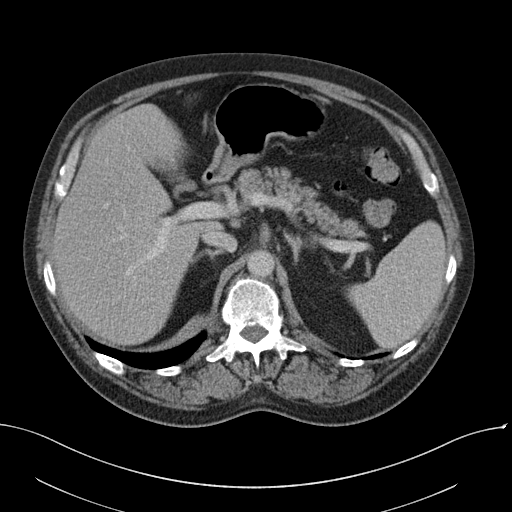
[im 81/96  soft-tissue]
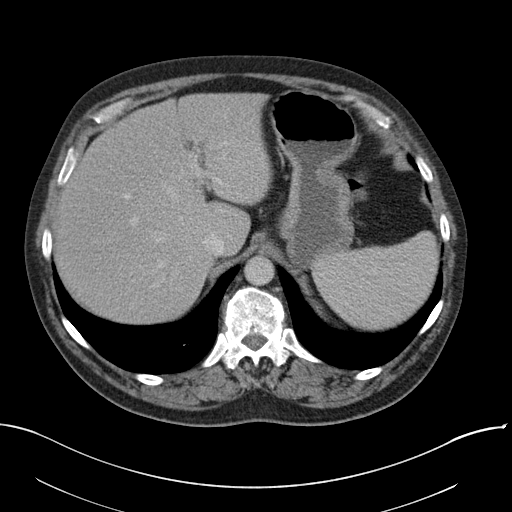
[im 88/96  soft-tissue]
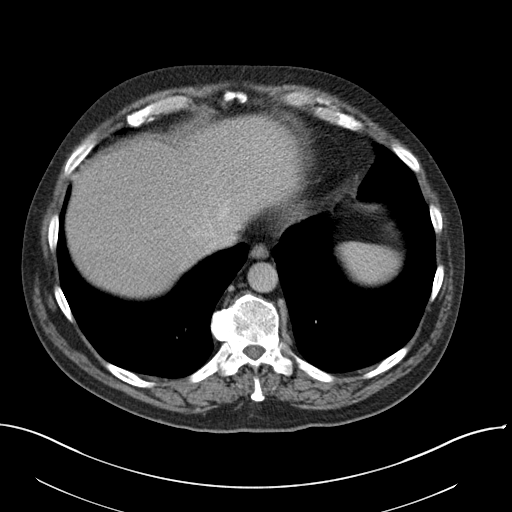

[Series 5: coronal st · coronal · 0.86mm/px · 3 of 166 slices shown]
[im 56/166  soft-tissue]
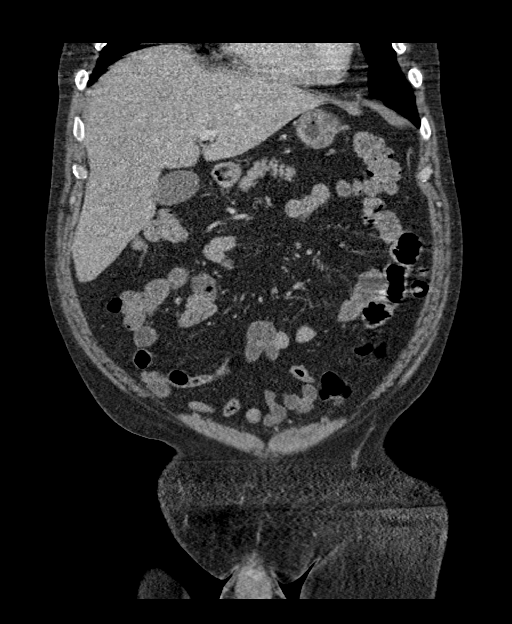
[im 74/166  soft-tissue]
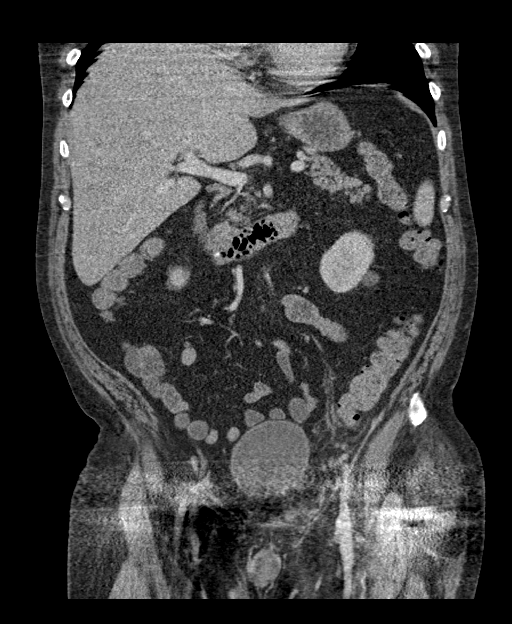
[im 92/166  soft-tissue]
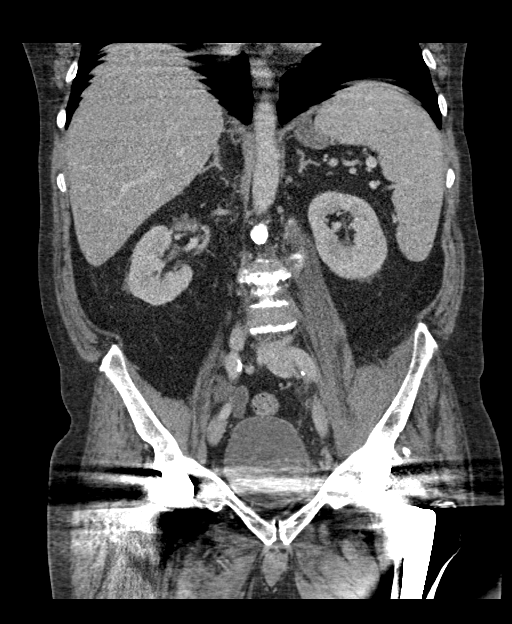

[15 of 46 positions shown; findings below may reference images not displayed]

FINDINGS: Lower chest: No acute pleural or parenchymal lung disease.

Hepatobiliary: No focal liver abnormality is seen. No gallstones,
gallbladder wall thickening, or biliary dilatation.

Pancreas: Unremarkable. No pancreatic ductal dilatation or
surrounding inflammatory changes.

Spleen: Normal in size without focal abnormality.

Adrenals/Urinary Tract: Stable cyst inferolateral left kidney.
Otherwise the kidneys enhance normally and symmetrically. No urinary
tract calculi or obstruction. Bladder is grossly normal, with
limited evaluation of the bladder base due to streak artifact from
bilateral hip arthroplasties. Adrenals are unremarkable.

Stomach/Bowel: No bowel obstruction or ileus. Normal appendix right
lower quadrant. No bowel wall thickening or inflammatory change.

Vascular/Lymphatic: Aortic atherosclerosis. Multiple borderline
enlarged retroperitoneal and left iliac chain lymph nodes are seen.
Largest lymph node on image 64/2 in the left external iliac chain
measures up to 8 mm in size.

Reproductive: Evaluation of the prostate is limited by streak
artifact from bilateral hip arthroplasties.

Other: No free fluid or free intraperitoneal gas. No abdominal wall
hernia.

Musculoskeletal: There are no acute displaced fractures. Disc space
narrowing and endplate sclerosis at L3/L4, likely sequela from
previous infection.

There is retroperitoneal fat stranding along the left iliac vessels,
with asymmetric enlargement of the left iliacus and left gluteal
musculature. Underlying infection cannot be excluded. No fluid
collection or abscess.

Reconstructed images demonstrate no additional findings.
IMPRESSION: 1. Retroperitoneal fat stranding along the left iliac chain, with
enlargement of the left iliacus and gluteus musculature. Findings
are consistent with underlying inflammation or infection. No fluid
collection or abscess at this time.
2. Disc space narrowing and endplate sclerosis at L3/L4, consistent
with sequela of previous infection.
3.  Aortic Atherosclerosis ([KW]-[KW]).

## 2020-04-10 MED ORDER — HYDROMORPHONE HCL 1 MG/ML IJ SOLN
0.5000 mg | INTRAMUSCULAR | Status: DC | PRN
  Administered 2020-04-10 – 2020-04-11 (×6): 1 mg via INTRAVENOUS
  Filled 2020-04-10 (×6): qty 1

## 2020-04-10 MED ORDER — KETOROLAC TROMETHAMINE 30 MG/ML IJ SOLN
30.0000 mg | Freq: Three times a day (TID) | INTRAMUSCULAR | Status: AC | PRN
  Administered 2020-04-10: 30 mg via INTRAVENOUS
  Filled 2020-04-10 (×2): qty 1

## 2020-04-10 MED ORDER — CYCLOBENZAPRINE HCL 10 MG PO TABS
10.0000 mg | ORAL_TABLET | Freq: Three times a day (TID) | ORAL | Status: DC | PRN
  Administered 2020-04-11 – 2020-04-15 (×7): 10 mg via ORAL
  Filled 2020-04-10 (×8): qty 1

## 2020-04-10 MED ORDER — HYDROMORPHONE HCL 1 MG/ML IJ SOLN
1.0000 mg | Freq: Once | INTRAMUSCULAR | Status: AC
  Administered 2020-04-10: 1 mg via INTRAVENOUS
  Filled 2020-04-10: qty 1

## 2020-04-10 MED ORDER — MUPIROCIN 2 % EX OINT
1.0000 "application " | TOPICAL_OINTMENT | Freq: Two times a day (BID) | CUTANEOUS | Status: DC
  Administered 2020-04-11 – 2020-04-15 (×9): 1 via NASAL
  Filled 2020-04-10 (×5): qty 22

## 2020-04-10 MED ORDER — CEFAZOLIN SODIUM-DEXTROSE 2-4 GM/100ML-% IV SOLN
2.0000 g | Freq: Three times a day (TID) | INTRAVENOUS | Status: DC
  Administered 2020-04-10 – 2020-04-15 (×15): 2 g via INTRAVENOUS
  Filled 2020-04-10 (×18): qty 100

## 2020-04-10 MED ORDER — SODIUM CHLORIDE 0.9 % IV SOLN
2.0000 g | Freq: Once | INTRAVENOUS | Status: AC
  Administered 2020-04-10: 2 g via INTRAVENOUS
  Filled 2020-04-10: qty 2

## 2020-04-10 MED ORDER — ENOXAPARIN SODIUM 40 MG/0.4ML ~~LOC~~ SOLN: 40.0000 mg | SUBCUTANEOUS | Status: DC

## 2020-04-10 MED ORDER — ACETAMINOPHEN 500 MG PO TABS
1000.0000 mg | ORAL_TABLET | Freq: Once | ORAL | Status: AC
  Administered 2020-04-10: 1000 mg via ORAL
  Filled 2020-04-10: qty 2

## 2020-04-10 MED ORDER — VANCOMYCIN HCL IN DEXTROSE 1-5 GM/200ML-% IV SOLN: 1000.0000 mg | Freq: Two times a day (BID) | INTRAVENOUS | Status: DC

## 2020-04-10 MED ORDER — VANCOMYCIN HCL 2000 MG/400ML IV SOLN
2000.0000 mg | INTRAVENOUS | Status: AC
  Administered 2020-04-10: 2000 mg via INTRAVENOUS
  Filled 2020-04-10: qty 400

## 2020-04-10 MED ORDER — ESCITALOPRAM OXALATE 20 MG PO TABS
20.0000 mg | ORAL_TABLET | Freq: Every day | ORAL | Status: DC
  Filled 2020-04-10 (×3): qty 1

## 2020-04-10 MED ORDER — LIDOCAINE HCL 1 % IJ SOLN
INTRAMUSCULAR | Status: AC
  Filled 2020-04-10: qty 20

## 2020-04-10 MED ORDER — GADOBUTROL 1 MMOL/ML IV SOLN
10.0000 mL | Freq: Once | INTRAVENOUS | Status: AC | PRN
  Administered 2020-04-10: 10 mL via INTRAVENOUS

## 2020-04-10 MED ORDER — IOHEXOL 300 MG/ML  SOLN
100.0000 mL | Freq: Once | INTRAMUSCULAR | Status: AC | PRN
  Administered 2020-04-10: 100 mL via INTRAVENOUS

## 2020-04-10 MED ORDER — SODIUM CHLORIDE 0.9 % IV SOLN
2.0000 g | Freq: Four times a day (QID) | INTRAVENOUS | Status: DC
  Filled 2020-04-10 (×3): qty 2000

## 2020-04-10 MED ORDER — SODIUM CHLORIDE 0.9 % IV SOLN: INTRAVENOUS | Status: AC

## 2020-04-10 MED ORDER — ONDANSETRON HCL 4 MG/2ML IJ SOLN: 4.0000 mg | Freq: Four times a day (QID) | INTRAMUSCULAR | Status: DC | PRN

## 2020-04-10 MED ORDER — HYDROMORPHONE HCL 1 MG/ML IJ SOLN
0.5000 mg | Freq: Once | INTRAMUSCULAR | Status: AC
  Administered 2020-04-10: 0.5 mg via INTRAVENOUS
  Filled 2020-04-10: qty 1

## 2020-04-10 MED ORDER — SODIUM CHLORIDE 0.9 % IV SOLN
2.0000 g | Freq: Three times a day (TID) | INTRAVENOUS | Status: DC
  Administered 2020-04-10: 2 g via INTRAVENOUS
  Filled 2020-04-10: qty 2

## 2020-04-10 MED ORDER — ACETAMINOPHEN 325 MG PO TABS: 650.0000 mg | ORAL_TABLET | ORAL | Status: DC | PRN

## 2020-04-10 MED ORDER — ONDANSETRON HCL 4 MG PO TABS: 4.0000 mg | ORAL_TABLET | Freq: Four times a day (QID) | ORAL | Status: DC | PRN

## 2020-04-10 MED ORDER — PANTOPRAZOLE SODIUM 40 MG PO TBEC: 40.0000 mg | DELAYED_RELEASE_TABLET | Freq: Every day | ORAL | Status: DC | PRN

## 2020-04-10 MED ORDER — LIDOCAINE HCL (PF) 1 % IJ SOLN
INTRAMUSCULAR | Status: AC | PRN
  Administered 2020-04-10: 5 mL

## 2020-04-10 NOTE — ED Provider Notes (Signed)
Patient transferred from [facility] for MRI.  He has severe left hip and low back pain for the past 5 days unable to ambulate.  Developed fevers 2 days ago.  He has a history of epidural abscess with cord compression that requires surgical decompression and several weeks of IV antibiotics.  This feels similar to previous.  Work-up at [facility] long showed some stranding on the CT scan without evidence of psoas abscess.  He was febrile to 101.  Blood cultures were sent.  CRP and sed rate were elevated. Broad-spectrum antibiotics are started on arrival here. Hospitalist requested MRI prior to admission.  Patient has severe pain with any attempted range of motion of the left hip.  There is left paraspinal lumbar tenderness and midline tenderness. Ankle flexion and extension intact bilaterally.  Intact DP pulses bilaterally  Plan to obtain MRI lumbar spine to rule out epidural abscess and/or cord compression.  Majority of patient's pain appears to be in his left hip so we will MRI this as well to look for septic joint.  Given his fever leukocytosis and elevated inflammatory markers, broad-spectrum antibiotics were started.  Blood cultures were obtained.  MRI is pending at time of shift change.  Patient will need admission. Care transferred at shift change to Dr. Tyrone [name].    [name] Essex, MD 04/10/20 0700

## 2020-04-10 NOTE — ED Provider Notes (Signed)
I received the patient in signout from Dr. Kingsley Callander.  Briefly the patient is a 54 year old male with a chief complaints of left hip pain and fever.  The patient had a epidural abscess that required surgical intervention and a long course of antibiotics.  Also had a right psoas muscle abscess that required surgical drainage back in January.  MRI is returned with left septic arthritis, as well as concern for developing abscess to the L iliacus muscle.   Will discuss with Ortho.  I discussed the case with the patient's orthopedist, Dr. Durward Fortes.  He recommended putting the patient back into the hospital.  I will discuss with the hospitalist service.  Ortho to come see at bedside.  I discussed case with Dr. Earleen Newport, interventional radiology.  This is not a procedure that typically goes through IR he recommended placing the order for fluoroscopy guided arthrocentesis of the hip.  Order was placed and should be picked up by the radiologist on call for the procedure.   Deno Etienne, DO 04/10/20 1309

## 2020-04-10 NOTE — Progress Notes (Signed)
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  Will plan for I&D and head and liner exchange tomorrow.  Recommend consulting IR for drainage of iliacus abscess.  NPO after midnight.  Hold lovenox.  Azucena Cecil, MD [facility] [phone] 7:57 PM

## 2020-04-10 NOTE — ED Notes (Signed)
Transfer from [facility] for MRI of his back. States he was fine when he went to bed on fri and sat morning when he woke up had to have assistance to walk. States he is afraid he is getting a spinal infection like he had in Dec. 2020

## 2020-04-10 NOTE — ED Notes (Signed)
Patient transported to IR 

## 2020-04-10 NOTE — Telephone Encounter (Signed)
Called ER MD-thanks

## 2020-04-10 NOTE — Telephone Encounter (Signed)
Delores from [facility] ER called for Dr. Tyrone Nine. Dr. Tyrone Nine would like Dr.Whitfield to call him regarding patient [name], DOB 02/10/1966. Per Janett Billow from the answering service no other details were given. -----Please have Dr. Durward Fortes to  Call them back at [phone]

## 2020-04-10 NOTE — Consult Note (Signed)
ORTHOPAEDIC CONSULTATION  REQUESTING PHYSICIAN: Lequita Halt, MD  Chief Complaint: Left hip pain  HPI: [name] is a 54 y.o. male who presents with acute onset of left hip pain since this weekend.  Denies any injuries.  Has a history of epidural abscess s/p lumbar laminectomy by Dr. Saintclair Halsted in Dec of 2020.  He has had fevers.  Presented to the ER.  MRI shows large left hip joint effusion concerning for infection.  CRP, ESR, WBC all elevated.  Past Medical History:  Diagnosis Date  . Cauda equina syndrome ([place]) 07/31/2019  . 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 History of chicken pox   . Medical history non-contributory    Past Surgical History:  Procedure Laterality Date  . CARPAL TUNNEL RELEASE Bilateral 2009  . CATARACT EXTRACTION W/PHACO Left 12/17/2019   Procedure: CATARACT EXTRACTION PHACO AND INTRAOCULAR LENS PLACEMENT (IOC) LEFT;  Surgeon: Eulogio Bear, MD;  Location: [facility];  Service: Ophthalmology;  Laterality: Left;  3.61 0:26.9  . CATARACT EXTRACTION W/PHACO Right 03/17/2020   Procedure: CATARACT EXTRACTION PHACO AND INTRAOCULAR LENS PLACEMENT (IOC) RIGHT 4.41  00:28.1;  Surgeon: Eulogio Bear, MD;  Location: [facility];  Service: Ophthalmology;  Laterality: Right;  . COLONOSCOPY W/ POLYPECTOMY    . JOINT REPLACEMENT    . LACERATION REPAIR Right ~ 2012   leg  . LUMBAR LAMINECTOMY/DECOMPRESSION MICRODISCECTOMY N/A 07/24/2019   Procedure: Decompressive Lumbar Laminectomy Lumbar three-four Lumbar four-five for Epidural Abscess;  Surgeon: Kary Kos, MD;  Location: [facility];  Service: Neurosurgery;  Laterality: N/A;  . TOTAL HIP ARTHROPLASTY Right 01/11/2013   Procedure: TOTAL HIP ARTHROPLASTY;  Surgeon: Garald Balding, MD;  Location: [facility];  Service: Orthopedics;  Laterality: Right;  . TOTAL HIP ARTHROPLASTY Left 10/12/2016   Procedure: TOTAL HIP ARTHROPLASTY;  Surgeon: Garald Balding, MD;  Location: [facility];  Service: Orthopedics;   Laterality: Left;   Social History   Socioeconomic History  . Marital status: Married    Spouse name: Manuela Schwartz  . Number of children: 2  . Years of education: High school  . Highest education level: Not on file  Occupational History  . 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 Smoking status: Former Smoker    Packs/day: 1.00    Years: 14.00    Pack years: 14.00    Types: Cigarettes    Quit date: 07/27/1999    Years since quitting: 20.7  . Smokeless tobacco: Never Used  Vaping Use  . Vaping Use: Never used  Substance and Sexual Activity  . Alcohol use: Yes    Alcohol/week: 24.0 standard drinks    Types: 24 Cans of beer per week    Comment: 4-5 beers per day, case of beer per week  . Drug use: No  . Sexual activity: Yes    Birth control/protection: Post-menopausal  Other Topics Concern  . Not on file  Social History Narrative   02/05/19   From: the area   Living: Living with wife Manuela Schwartz   Work: Dealer      Family: 2 children - [name] and [name] - liver nearby, no grandkids      Enjoys: float down the river, race track, camping      Exercise: just keeping busy at work   Diet: mostly meat and potatoes      Safety   Seat belts: Yes    Guns: Yes  and secure   Safe in relationships: Yes    Social Determinants of Health   Financial Resource Strain:   . Difficulty of Paying Living Expenses: Not on file  Food Insecurity:   . Worried About Charity fundraiser in the Last Year: Not on file  . Ran Out of Food in the Last Year: Not on file  Transportation Needs:   . Lack of Transportation (Medical): Not on file  . Lack of Transportation (Non-Medical): Not on file  Physical Activity:   . Days of Exercise per Week: Not on file  . Minutes of Exercise per Session: Not on file  Stress:   . Feeling of Stress : Not on file  Social Connections:   . Frequency of Communication with Friends and Family: Not on file  . Frequency of  Social Gatherings with Friends and Family: Not on file  . Attends Religious Services: Not on file  . Active Member of Clubs or Organizations: Not on file  . Attends Archivist Meetings: Not on file  . Marital Status: Not on file   Family History  Problem Relation Age of Onset  . Cancer Father        Hodgkin's disease  . COPD Mother   . Heart attack Maternal Grandfather 80  . Diabetes Neg Hx   . Stroke Neg Hx   . Hypertension Neg Hx   . Hyperlipidemia Neg Hx    - negative except otherwise stated in the family history section Allergies  Allergen Reactions  . Bee Venom Anaphylaxis  . Hydrocodone Nausea And Vomiting    (12/10/19 - pt says he has taken without issues)   Prior to Admission medications   Medication Sig Start Date End Date Taking? Authorizing Provider  acetaminophen (TYLENOL) 325 MG tablet Take 2 tablets (650 mg total) by mouth every 4 (four) hours as needed for mild pain ((score 1 to 3) or temp > 100.5). 08/09/19  Yes Angiulli, Lavon Paganini, PA-C  cyclobenzaprine (FLEXERIL) 10 MG tablet TAKE 1 TABLET BY MOUTH THREE TIMES A DAY AS NEEDED FOR MUSCLE SPASMS Patient taking differently: Take 10 mg by mouth 3 (three) times daily as needed.  02/21/20  Yes Lesleigh Noe, MD  escitalopram (LEXAPRO) 20 MG tablet Take 1 tablet (20 mg total) by mouth daily. 03/27/20  Yes Lesleigh Noe, MD  omeprazole (PRILOSEC OTC) 20 MG tablet Take 20 mg by mouth daily as needed (heart burn).   Yes [provider]  Oxycodone HCl 10 MG TABS Take 1 tablet (10 mg total) by mouth 2 (two) times daily as needed. 04/07/20  Yes Lesleigh Noe, MD  Oxycodone HCl 10 MG TABS Take 1 tablet (10 mg total) by mouth 2 (two) times daily as needed. Patient not taking: Reported on 04/10/2020 03/07/20   Lesleigh Noe, MD  Oxycodone HCl 20 MG TABS Take 0.5 tablets (10 mg total) by mouth every 8 (eight) hours as needed. Patient not taking: Reported on 04/10/2020 03/10/20   Lesleigh Noe, MD    cyclobenzaprine (FLEXERIL) 10 MG tablet Take 1 tablet (10 mg total) by mouth 3 (three) times daily as needed for muscle spasms. 09/14/19   Elby Beck, FNP  cyclobenzaprine (FLEXERIL) 10 MG tablet TAKE 1 TABLET BY MOUTH THREE TIMES A DAY AS NEEDED FOR MUSCLE SPASMS 11/07/19   Lesleigh Noe, MD   DG Tibia/Fibula Left  Result Date: 04/09/2020 CLINICAL DATA:  Leg pain EXAM: LEFT TIBIA AND FIBULA - 2  VIEW COMPARISON:  None. FINDINGS: There is no evidence of fracture or other focal bone lesions. Soft tissues are unremarkable. IMPRESSION: Negative. Electronically Signed   By: Fidela Salisbury MD   On: 04/09/2020 15:03   MR Lumbar Spine W Wo Contrast  Result Date: 04/10/2020 CLINICAL DATA:  Low back pain, infection suspected. Additional history provided: Left hip and leg pain for 1 week, history of discitis/osteomyelitis, history of bilateral hip replacements. EXAM: MRI LUMBAR SPINE WITHOUT AND WITH CONTRAST TECHNIQUE: Multiplanar and multiecho pulse sequences of the lumbar spine were obtained without and with intravenous contrast. CONTRAST:  73m GADAVIST GADOBUTROL 1 MMOL/ML IV SOLN COMPARISON:  Lumbar spine MRI 11/09/2019. CT abdomen/pelvis 04/10/2020. FINDINGS: Segmentation: For the purposes of this dictation, five lumbar vertebrae are assumed and the caudal most well-formed intervertebral disc is designated L5-S1. Alignment: Straightening of the expected lumbar lordosis. L3-L4 and L4-L5 grade 1 retrolisthesis. Vertebrae: Vertebral body height is maintained. Multilevel vertebral body hemangiomas. Multilevel degenerative endplate irregularity with small Schmorl nodes. There is incompletely imaged marrow edema within the left sacrum and left iliac bone abutting the left SI joint (series 6, image 16). These findings raise the possibility of osteomyelitis at this site, possibly with associated sacroiliitis. There is mild edema signal along the margins of the L3-L4 disc space (for instance as seen on series  14, images 4-13). Prominent multilevel ventrolateral osteophytes. Conus medullaris and cauda equina: Conus extends to the L1-L2 level. No signal abnormality within the visualized distal spinal cord. Paraspinal and other soft tissues: There is prominent edema signal and enhancement within the posterior aspect of the left iliopsoas muscle (for instance as seen on series 8, image 40) (series 15, image 38). These findings are consistent with infection/phlegmon. Centrally within this region there are few tiny (subcentimeter) foci of hypoenhancement which could reflect tiny abscesses (series 15, image 18). Disc levels: Unless otherwise stated, the level by level findings below have not significantly changed since prior MRI 11/09/2019. Multilevel disc degeneration. Most notably, there is moderate disc degeneration at L3-L4. L1-L2: Mild disc bulge and facet arthrosis. No significant spinal canal stenosis or neural foraminal narrowing. L2-L3: Mild disc bulging and facet arthrosis. No significant spinal canal stenosis or neural foraminal narrowing. L3-L4: Grade 1 retrolisthesis. Prior posterior decompression. Disc bulge with endplate spurring. Facet hypertrophy. Mild bilateral subarticular narrowing. Central canal patent. Mild bilateral inferior neural foraminal narrowing. Unchanged mild nonspecific fluid signal and enhancement within the facet joints bilaterally. L4-L5: Grade 1 retrolisthesis. Prior posterior decompression. Disc bulge with endplate spurring. Facet hypertrophy. Mild bilateral subarticular narrowing. Mild bilateral neural foraminal narrowing. Unchanged mild nonspecific fluid signal and enhancement within the facet joints bilaterally. L5-S1: Bilateral facet arthrosis (severe right, moderate left). No significant disc herniation, spinal canal stenosis or neural foraminal narrowing. IMPRESSION: Incompletely imaged marrow edema within the left sacrum and iliac bone, abutting the left sacroiliac joint. Findings  raise the possibility of osteomyelitis at this site, possibly with left sacroiliitis. Consider contrast-enhanced MR imaging of the sacrum and pelvis for further evaluation. Partially imaged prominent edema and enhancement within the left iliopsoas muscle consistent with infection/phlegmon. Centrally within this region, there are several tiny (subcentimeter) foci of relative hypoenhancement which may reflect tiny abscesses. Unchanged subtle enhancement along the margins of the L3-L4 disc space, nonspecific but possibly reflecting mild residual discitis/osteomyelitis. Mild fluid signal and enhancement within the bilateral L3-L4 and L4-L5 facet joints. These findings are unchanged and also nonspecific. Infection cannot be excluded at these sites. Lumbar spondylosis as outlined and unchanged. No  more than mild spinal canal stenosis or neural foraminal narrowing. Electronically Signed   By: Kellie Simmering DO   On: 04/10/2020 11:23   MR HIP LEFT W WO CONTRAST  Result Date: 04/10/2020 CLINICAL DATA:  Left hip and leg pain for the past week. Concern for septic arthritis. History of lumbar osteomyelitis-discitis. Prior bilateral hip arthroplasties. EXAM: MRI OF THE LEFT HIP WITHOUT AND WITH CONTRAST TECHNIQUE: Multiplanar, multisequence MR imaging was performed both before and after administration of intravenous contrast. CONTRAST:  60m GADAVIST GADOBUTROL 1 MMOL/ML IV SOLN COMPARISON:  CT abdomen pelvis from same day. Left hip x-rays from yesterday. MRI left hip dated December 26, 2012. FINDINGS: Bones: Prior bilateral total hip arthroplasties with associated susceptibility artifact. Focal marrow edema in the left medial acetabulum with corresponding decreased T1 marrow signal (series 4 and 5, images 10-15). There is no evidence of acute fracture, dislocation or avascular necrosis. No focal bone lesion. The visualized sacroiliac joints and symphysis pubis appear normal. Joint or bursal effusion Joint effusion: Large left hip  joint effusion extending into the left gluteus minimus muscle with prominent synovial thickening and enhancement. No right hip joint effusion. Bursae: Small amount of fluid in the left greater trochanteric bursa. Muscles and tendons Muscles and tendons: Prominent edema within the left gluteus minimus, left piriformis and, and left iliacus muscles. Inflammatory changes involve the greater sciatic foramen. Developing 3.1 cm abscess in the left iliacus muscle (series 17, image 21). The visualized gluteus, hamstring and iliopsoas tendons are intact. Partial tear of the right adductor magnus tendon near the ischial tuberosity with surrounding edema. Other findings Miscellaneous: The visualized internal pelvic contents appear unremarkable. IMPRESSION: 1. Left hip septic arthritis. 2. Marrow signal changes in the left medial acetabulum concerning for osteomyelitis. 3. Left gluteus minimus, left piriformis, and left iliacus myositis with developing 3.1 cm abscess in the left iliacus muscle. 4. Mild left greater trochanteric bursitis. 5. Partial tear of the right adductor magnus tendon near the ischial tuberosity. Electronically Signed   By: WTitus DubinM.D.   On: 04/10/2020 11:26   CT ABDOMEN PELVIS W CONTRAST  Result Date: 04/10/2020 CLINICAL DATA:  Left hip and leg pain for 1 week, history of discitis/osteomyelitis EXAM: CT ABDOMEN AND PELVIS WITH CONTRAST TECHNIQUE: Multidetector CT imaging of the abdomen and pelvis was performed using the standard protocol following bolus administration of intravenous contrast. CONTRAST:  1053mOMNIPAQUE IOHEXOL 300 MG/ML  SOLN COMPARISON:  07/19/2019, 07/24/2019, 11/09/2019 FINDINGS: Lower chest: No acute pleural or parenchymal lung disease. Hepatobiliary: No focal liver abnormality is seen. No gallstones, gallbladder wall thickening, or biliary dilatation. Pancreas: Unremarkable. No pancreatic ductal dilatation or surrounding inflammatory changes. Spleen: Normal in size  without focal abnormality. Adrenals/Urinary Tract: Stable cyst inferolateral left kidney. Otherwise the kidneys enhance normally and symmetrically. No urinary tract calculi or obstruction. Bladder is grossly normal, with limited evaluation of the bladder base due to streak artifact from bilateral hip arthroplasties. Adrenals are unremarkable. Stomach/Bowel: No bowel obstruction or ileus. Normal appendix right lower quadrant. No bowel wall thickening or inflammatory change. Vascular/Lymphatic: Aortic atherosclerosis. Multiple borderline enlarged retroperitoneal and left iliac chain lymph nodes are seen. Largest lymph node on image 64/2 in the left external iliac chain measures up to 8 mm in size. Reproductive: Evaluation of the prostate is limited by streak artifact from bilateral hip arthroplasties. Other: No free fluid or free intraperitoneal gas. No abdominal wall hernia. Musculoskeletal: There are no acute displaced fractures. Disc space narrowing and endplate sclerosis at L3P9/J0  likely sequela from previous infection. There is retroperitoneal fat stranding along the left iliac vessels, with asymmetric enlargement of the left iliacus and left gluteal musculature. Underlying infection cannot be excluded. No fluid collection or abscess. Reconstructed images demonstrate no additional findings. IMPRESSION: 1. Retroperitoneal fat stranding along the left iliac chain, with enlargement of the left iliacus and gluteus musculature. Findings are consistent with underlying inflammation or infection. No fluid collection or abscess at this time. 2. Disc space narrowing and endplate sclerosis at X2/K2, consistent with sequela of previous infection. 3.  Aortic Atherosclerosis (ICD10-I70.0). Electronically Signed   By: Randa Ngo M.D.   On: 04/10/2020 01:05   DG Hip Unilat With Pelvis 2-3 Views Left  Result Date: 04/09/2020 CLINICAL DATA:  Left hip pain 1 week EXAM: DG HIP (WITH OR WITHOUT PELVIS) 2-3V LEFT COMPARISON:   None. FINDINGS: The patient is status post left total hip arthroplasty. No periprosthetic lucency or fracture is identified. There is also a right total hip arthroplasty. Mild sclerosis around both bilateral sacroiliac joints. IMPRESSION: Left total hip arthroplasty without complication. Electronically Signed   By: Prudencio Pair M.D.   On: 04/09/2020 15:03   DG FEMUR 1V LEFT  Result Date: 04/09/2020 CLINICAL DATA:  Left hip pain EXAM: LEFT FEMUR 1 VIEW COMPARISON:  None. FINDINGS: Single view radiograph of the left femur demonstrates surgical changes of left total hip arthroplasty. Arthroplasty components overlie the expected position. Mild stress buttressing noted adjacent to the a distal aspect of the femoral stem component. No suspicious lytic or blastic bone lesions. No acute fracture or dislocation. Soft tissues are unremarkable. IMPRESSION: 1. Postsurgical changes of left total hip arthroplasty. No evidence of hardware complication. 2. No acute fracture or dislocation. Electronically Signed   By: Fidela Salisbury MD   On: 04/09/2020 15:05   - pertinent xrays, CT, MRI studies were reviewed and independently interpreted  Positive ROS: All other systems have been reviewed and were otherwise negative with the exception of those mentioned in the HPI and as above.  Physical Exam: General: No acute distress Cardiovascular: No pedal edema Respiratory: No cyanosis, no use of accessory musculature GI: No organomegaly, abdomen is soft and non-tender Skin: No lesions in the area of chief complaint Neurologic: Sensation intact distally Psychiatric: Patient is at baseline mood and affect Lymphatic: No axillary or cervical lymphadenopathy  MUSCULOSKELETAL:  - severe pain with any movement of the LLE - NVI distally  Assessment: Severe left hip pain suspect prosthetic joint infection  Plan: - lovenox held for IR hip aspiration and likely surgery - NPO after midnight - pain control per primary team -  discussed treatment plan with patient in case joint aspiration is consistent with infection - please send joint aspiration fluid for cell count and cultures  Thank you for the consult and the opportunity to see [name] Eduard Roux, MD [facility] 2:06 PM

## 2020-04-10 NOTE — Progress Notes (Addendum)
2/2 blood cultures growing MSSA. Discussed w/ ortho & internal medicine to deescalate. ID ordered cefazolin.   Pharmacy Antibiotic Note  [name] is a 54 y.o. male admitted on 04/09/2020 with possible osteomyelitis/septic arthritis. Pharmacy has been consulted for vancomycin and cefepime dosing.  PMH s/f epidural abscess with lumbar laminectomy 06/2019. He has had new onset left hip pain and fever for a few days. MRI shows hip joint effusion c/f infection.  WBC 13.6, CRP and ESR are elevated. Tmax is 100.9. Kidney function is below baseline with SCr 1.37 (BL 0.7) and CrCl ~70-80.  Plan: Vancomycin 1,029m IV every 12 hours.  Goal trough 15-20 mcg/mL.  Cefepime 2g IV every 8 hours  Follow up with cultures, antibiotic de-escalation and LOT Monitor renal function and clinical progress Follow plans for surgery and antibiotic schedule  Height: 6' (182.9 cm) Weight: 104.3 kg (230 lb) IBW/kg (Calculated) : 77.6  Temp (24hrs), Avg:99.3 F (37.4 C), Min:98 F (36.7 C), Max:100.9 F (38.3 C)  Recent Labs  Lab 04/09/20 2235 04/10/20 0107  WBC 13.6*  --   CREATININE 1.37*  --   LATICACIDVEN 2.0* 1.3    Estimated Creatinine Clearance: 77 mL/min (A) (by C-G formula based on SCr of 1.37 mg/dL (H)).    Allergies  Allergen Reactions  . Bee Venom Anaphylaxis  . Hydrocodone Nausea And Vomiting    (12/10/19 - pt says he has taken without issues)    Antimicrobials this admission: 9/16 cefepime >> 9/16 vancomycin >>   Dose adjustments this admission: N/a  Microbiology results: 9/15 BCx: pending 9/16 UCx: pending  Thank you for allowing pharmacy to be a part of this patient's care.  CMercy Riding PharmD PGY1 Acute Care Pharmacy Resident Please refer to [facility]for [facility]-specific pharmacist

## 2020-04-10 NOTE — ED Notes (Signed)
Hospitalist at bedside 

## 2020-04-10 NOTE — ED Notes (Signed)
Patient transported to CT 

## 2020-04-10 NOTE — ED Notes (Signed)
Unable to obtain 2nd set of cultures 

## 2020-04-10 NOTE — ED Notes (Signed)
Off floor to MRI

## 2020-04-10 NOTE — H&P (Signed)
History and Physical    [name] VQX:[id number] DOB: 08/23/1965 DOA: 04/09/2020  PCP: Lesleigh Noe, MD (Confirm with patient/family/NH records and if not entered, this has to be entered at [facility] point of entry) Patient coming from: Home  I have personally briefly reviewed patient's old medical records in [place]  Chief Complaint: Left hip pain  HPI: [name] is a 54 y.o. male with medical history significant of lumbar epidural abscess, spinal stenosis status post decompression and laminectomy L3-4, L4-L5, and CT aspiration of right psoas abscess, MSSA bacteremia in December 2020, with extensive hospital stay, IV antibiotics (total of 6 weeks till Feb 2021) and wound care with the wound VAC on the sacral pressure ulcer until June this year, and 2 small wound well healed and patient was discharged home with her on July/14.  Patient reports that been doing well no more open wound on his back.  Saturday morning, he woke up with severe left hip pain, sudden onset, denies any fever chills.  No back pain.  Pain was sharp like, episodic, worsening with putting weight on or walk on the left hip.  Denied any superficial infection, no fever chills last week, no urinary symptoms no diarrhea no URI symptoms including cough shortness of breath runny nose all week last week. ED Course: Left hip MRI showed left hip arthritis, left medial acetabulum concerning for osteomyelitis, left gluteus minimus left piriformis and left iliac myositis and 3.1 cm abscess in the left ilacus muscle.  WBC 13.6, creatinine 1.3.  Sodium 132.  Significant elevation of CRP and ESR.  Review of Systems: As per HPI otherwise 14 point review of systems negative.    Past Medical History:  Diagnosis Date  . Cauda equina syndrome ([place]) 07/31/2019  . 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 History of chicken pox   . Medical history non-contributory     Past Surgical History:  Procedure Laterality Date  . CARPAL  TUNNEL RELEASE Bilateral 2009  . CATARACT EXTRACTION W/PHACO Left 12/17/2019   Procedure: CATARACT EXTRACTION PHACO AND INTRAOCULAR LENS PLACEMENT (IOC) LEFT;  Surgeon: Eulogio Bear, MD;  Location: [place];  Service: Ophthalmology;  Laterality: Left;  3.61 0:26.9  . CATARACT EXTRACTION W/PHACO Right 03/17/2020   Procedure: CATARACT EXTRACTION PHACO AND INTRAOCULAR LENS PLACEMENT (IOC) RIGHT 4.41  00:28.1;  Surgeon: Eulogio Bear, MD;  Location: [facility];  Service: Ophthalmology;  Laterality: Right;  . COLONOSCOPY W/ POLYPECTOMY    . JOINT REPLACEMENT    . LACERATION REPAIR Right ~ 2012   leg  . LUMBAR LAMINECTOMY/DECOMPRESSION MICRODISCECTOMY N/A 07/24/2019   Procedure: Decompressive Lumbar Laminectomy Lumbar three-four Lumbar four-five for Epidural Abscess;  Surgeon: Kary Kos, MD;  Location: [facility];  Service: Neurosurgery;  Laterality: N/A;  . TOTAL HIP ARTHROPLASTY Right 01/11/2013   Procedure: TOTAL HIP ARTHROPLASTY;  Surgeon: Garald Balding, MD;  Location: [place];  Service: Orthopedics;  Laterality: Right;  . TOTAL HIP ARTHROPLASTY Left 10/12/2016   Procedure: TOTAL HIP ARTHROPLASTY;  Surgeon: Garald Balding, MD;  Location: [place];  Service: Orthopedics;  Laterality: Left;     reports that he quit smoking about 20 years ago. His smoking use included cigarettes. He has a 14.00 pack-year smoking history. He has never used smokeless tobacco. He reports current alcohol use of about 24.0 standard drinks of alcohol per week. He reports that he does not use drugs.  Allergies  Allergen Reactions  . Bee Venom Anaphylaxis  .  Hydrocodone Nausea And Vomiting    (12/10/19 - pt says he has taken without issues)    Family History  Problem Relation Age of Onset  . Cancer Father        Hodgkin's disease  . COPD Mother   . Heart attack Maternal Grandfather 80  . Diabetes Neg Hx   . Stroke Neg Hx   . Hypertension Neg Hx   . Hyperlipidemia Neg Hx      Prior  to Admission medications   Medication Sig Start Date End Date Taking? Authorizing Provider  acetaminophen (TYLENOL) 325 MG tablet Take 2 tablets (650 mg total) by mouth every 4 (four) hours as needed for mild pain ((score 1 to 3) or temp > 100.5). 08/09/19  Yes Angiulli, Lavon Paganini, PA-C  cyclobenzaprine (FLEXERIL) 10 MG tablet TAKE 1 TABLET BY MOUTH THREE TIMES A DAY AS NEEDED FOR MUSCLE SPASMS Patient taking differently: Take 10 mg by mouth 3 (three) times daily as needed.  02/21/20  Yes Lesleigh Noe, MD  escitalopram (LEXAPRO) 20 MG tablet Take 1 tablet (20 mg total) by mouth daily. 03/27/20  Yes Lesleigh Noe, MD  omeprazole (PRILOSEC OTC) 20 MG tablet Take 20 mg by mouth daily as needed (heart burn).   Yes [provider]  Oxycodone HCl 10 MG TABS Take 1 tablet (10 mg total) by mouth 2 (two) times daily as needed. 04/07/20  Yes Lesleigh Noe, MD  Oxycodone HCl 10 MG TABS Take 1 tablet (10 mg total) by mouth 2 (two) times daily as needed. Patient not taking: Reported on 04/10/2020 03/07/20   Lesleigh Noe, MD  Oxycodone HCl 20 MG TABS Take 0.5 tablets (10 mg total) by mouth every 8 (eight) hours as needed. Patient not taking: Reported on 04/10/2020 03/10/20   Lesleigh Noe, MD  cyclobenzaprine (FLEXERIL) 10 MG tablet Take 1 tablet (10 mg total) by mouth 3 (three) times daily as needed for muscle spasms. 09/14/19   Elby Beck, FNP  cyclobenzaprine (FLEXERIL) 10 MG tablet TAKE 1 TABLET BY MOUTH THREE TIMES A DAY AS NEEDED FOR MUSCLE SPASMS 11/07/19   Lesleigh Noe, MD    Physical Exam: Vitals:   04/10/20 0223 04/10/20 0300 04/10/20 0352 04/10/20 1304  BP: 101/68 126/81 136/79 108/72  Pulse: 85 86 97 72  Resp: '15 17 19 18  ' Temp:  99.8 F (37.7 C) 98.9 F (37.2 C) 98 F (36.7 C)  TempSrc:  Oral Oral   SpO2: 98% 96% 95% 97%  Weight:      Height:        Constitutional: NAD, calm, comfortable Vitals:   04/10/20 0223 04/10/20 0300 04/10/20 0352 04/10/20 1304  BP:  101/68 126/81 136/79 108/72  Pulse: 85 86 97 72  Resp: '15 17 19 18  ' Temp:  99.8 F (37.7 C) 98.9 F (37.2 C) 98 F (36.7 C)  TempSrc:  Oral Oral   SpO2: 98% 96% 95% 97%  Weight:      Height:       Eyes: PERRL, lids and conjunctivae normal ENMT: Mucous membranes are dry. Posterior pharynx clear of any exudate or lesions.Normal dentition.  Neck: normal, supple, no masses, no thyromegaly Respiratory: clear to auscultation bilaterally, no wheezing, no crackles. Normal respiratory effort. No accessory muscle use.  Cardiovascular: Regular rate and rhythm, no murmurs / rubs / gallops. No extremity edema. 2+ pedal pulses. No carotid bruits.  Abdomen: no tenderness, no masses palpated. No hepatosplenomegaly. Bowel sounds positive.  Musculoskeletal: Left hip tenderness with normal ROM but with severe pain.  Skin: no rashes, lesions, ulcers. No induration Neurologic: CN 2-12 grossly intact. Sensation intact, DTR normal. Strength 5/5 in all 4.  Psychiatric: Normal judgment and insight. Alert and oriented x 3. Normal mood.     Labs on Admission: I have personally reviewed following labs and imaging studies  CBC: Recent Labs  Lab 04/09/20 2235  WBC 13.6*  NEUTROABS 11.1*  HGB 14.1  HCT 43.4  MCV 95.8  PLT 616   Basic Metabolic Panel: Recent Labs  Lab 04/09/20 2235  NA 132*  K 4.0  CL 95*  CO2 24  GLUCOSE 102*  BUN 26*  CREATININE 1.37*  CALCIUM 9.3   GFR: Estimated Creatinine Clearance: 77 mL/min (A) (by C-G formula based on SCr of 1.37 mg/dL (H)). Liver Function Tests: Recent Labs  Lab 04/09/20 2235  AST 39  ALT 66*  ALKPHOS 163*  BILITOT 2.1*  PROT 9.4*  ALBUMIN 3.3*   No results for input(s): LIPASE, AMYLASE in the last 168 hours. No results for input(s): AMMONIA in the last 168 hours. Coagulation Profile: No results for input(s): INR, PROTIME in the last 168 hours. Cardiac Enzymes: No results for input(s): CKTOTAL, CKMB, CKMBINDEX, TROPONINI in the last 168  hours. BNP (last 3 results) No results for input(s): PROBNP in the last 8760 hours. HbA1C: No results for input(s): HGBA1C in the last 72 hours. CBG: No results for input(s): GLUCAP in the last 168 hours. Lipid Profile: No results for input(s): CHOL, HDL, LDLCALC, TRIG, CHOLHDL, LDLDIRECT in the last 72 hours. Thyroid Function Tests: No results for input(s): TSH, T4TOTAL, FREET4, T3FREE, THYROIDAB in the last 72 hours. Anemia Panel: No results for input(s): VITAMINB12, FOLATE, FERRITIN, TIBC, IRON, RETICCTPCT in the last 72 hours. Urine analysis:    Component Value Date/Time   COLORURINE AMBER (A) 04/10/2020 0300   APPEARANCEUR HAZY (A) 04/10/2020 0300   LABSPEC 1.040 (H) 04/10/2020 0300   PHURINE 5.0 04/10/2020 0300   GLUCOSEU NEGATIVE 04/10/2020 0300   HGBUR LARGE (A) 04/10/2020 0300   BILIRUBINUR NEGATIVE 04/10/2020 0300   KETONESUR NEGATIVE 04/10/2020 0300   PROTEINUR 100 (A) 04/10/2020 0300   UROBILINOGEN 0.2 01/09/2013 1046   NITRITE NEGATIVE 04/10/2020 0300   LEUKOCYTESUR NEGATIVE 04/10/2020 0300    Radiological Exams on Admission: DG Tibia/Fibula Left  Result Date: 04/09/2020 CLINICAL DATA:  Leg pain EXAM: LEFT TIBIA AND FIBULA - 2 VIEW COMPARISON:  None. FINDINGS: There is no evidence of fracture or other focal bone lesions. Soft tissues are unremarkable. IMPRESSION: Negative. Electronically Signed   By: Fidela Salisbury MD   On: 04/09/2020 15:03   MR Lumbar Spine W Wo Contrast  Result Date: 04/10/2020 CLINICAL DATA:  Low back pain, infection suspected. Additional history provided: Left hip and leg pain for 1 week, history of discitis/osteomyelitis, history of bilateral hip replacements. EXAM: MRI LUMBAR SPINE WITHOUT AND WITH CONTRAST TECHNIQUE: Multiplanar and multiecho pulse sequences of the lumbar spine were obtained without and with intravenous contrast. CONTRAST:  44m GADAVIST GADOBUTROL 1 MMOL/ML IV SOLN COMPARISON:  Lumbar spine MRI 11/09/2019. CT abdomen/pelvis  04/10/2020. FINDINGS: Segmentation: For the purposes of this dictation, five lumbar vertebrae are assumed and the caudal most well-formed intervertebral disc is designated L5-S1. Alignment: Straightening of the expected lumbar lordosis. L3-L4 and L4-L5 grade 1 retrolisthesis. Vertebrae: Vertebral body height is maintained. Multilevel vertebral body hemangiomas. Multilevel degenerative endplate irregularity with small Schmorl nodes. There is incompletely imaged marrow edema within the  left sacrum and left iliac bone abutting the left SI joint (series 6, image 16). These findings raise the possibility of osteomyelitis at this site, possibly with associated sacroiliitis. There is mild edema signal along the margins of the L3-L4 disc space (for instance as seen on series 14, images 4-13). Prominent multilevel ventrolateral osteophytes. Conus medullaris and cauda equina: Conus extends to the L1-L2 level. No signal abnormality within the visualized distal spinal cord. Paraspinal and other soft tissues: There is prominent edema signal and enhancement within the posterior aspect of the left iliopsoas muscle (for instance as seen on series 8, image 40) (series 15, image 38). These findings are consistent with infection/phlegmon. Centrally within this region there are few tiny (subcentimeter) foci of hypoenhancement which could reflect tiny abscesses (series 15, image 18). Disc levels: Unless otherwise stated, the level by level findings below have not significantly changed since prior MRI 11/09/2019. Multilevel disc degeneration. Most notably, there is moderate disc degeneration at L3-L4. L1-L2: Mild disc bulge and facet arthrosis. No significant spinal canal stenosis or neural foraminal narrowing. L2-L3: Mild disc bulging and facet arthrosis. No significant spinal canal stenosis or neural foraminal narrowing. L3-L4: Grade 1 retrolisthesis. Prior posterior decompression. Disc bulge with endplate spurring. Facet hypertrophy.  Mild bilateral subarticular narrowing. Central canal patent. Mild bilateral inferior neural foraminal narrowing. Unchanged mild nonspecific fluid signal and enhancement within the facet joints bilaterally. L4-L5: Grade 1 retrolisthesis. Prior posterior decompression. Disc bulge with endplate spurring. Facet hypertrophy. Mild bilateral subarticular narrowing. Mild bilateral neural foraminal narrowing. Unchanged mild nonspecific fluid signal and enhancement within the facet joints bilaterally. L5-S1: Bilateral facet arthrosis (severe right, moderate left). No significant disc herniation, spinal canal stenosis or neural foraminal narrowing. IMPRESSION: Incompletely imaged marrow edema within the left sacrum and iliac bone, abutting the left sacroiliac joint. Findings raise the possibility of osteomyelitis at this site, possibly with left sacroiliitis. Consider contrast-enhanced MR imaging of the sacrum and pelvis for further evaluation. Partially imaged prominent edema and enhancement within the left iliopsoas muscle consistent with infection/phlegmon. Centrally within this region, there are several tiny (subcentimeter) foci of relative hypoenhancement which may reflect tiny abscesses. Unchanged subtle enhancement along the margins of the L3-L4 disc space, nonspecific but possibly reflecting mild residual discitis/osteomyelitis. Mild fluid signal and enhancement within the bilateral L3-L4 and L4-L5 facet joints. These findings are unchanged and also nonspecific. Infection cannot be excluded at these sites. Lumbar spondylosis as outlined and unchanged. No more than mild spinal canal stenosis or neural foraminal narrowing. Electronically Signed   By: Kellie Simmering DO   On: 04/10/2020 11:23   MR HIP LEFT W WO CONTRAST  Result Date: 04/10/2020 CLINICAL DATA:  Left hip and leg pain for the past week. Concern for septic arthritis. History of lumbar osteomyelitis-discitis. Prior bilateral hip arthroplasties. EXAM: MRI OF  THE LEFT HIP WITHOUT AND WITH CONTRAST TECHNIQUE: Multiplanar, multisequence MR imaging was performed both before and after administration of intravenous contrast. CONTRAST:  82m GADAVIST GADOBUTROL 1 MMOL/ML IV SOLN COMPARISON:  CT abdomen pelvis from same day. Left hip x-rays from yesterday. MRI left hip dated December 26, 2012. FINDINGS: Bones: Prior bilateral total hip arthroplasties with associated susceptibility artifact. Focal marrow edema in the left medial acetabulum with corresponding decreased T1 marrow signal (series 4 and 5, images 10-15). There is no evidence of acute fracture, dislocation or avascular necrosis. No focal bone lesion. The visualized sacroiliac joints and symphysis pubis appear normal. Joint or bursal effusion Joint effusion: Large left hip joint effusion  extending into the left gluteus minimus muscle with prominent synovial thickening and enhancement. No right hip joint effusion. Bursae: Small amount of fluid in the left greater trochanteric bursa. Muscles and tendons Muscles and tendons: Prominent edema within the left gluteus minimus, left piriformis and, and left iliacus muscles. Inflammatory changes involve the greater sciatic foramen. Developing 3.1 cm abscess in the left iliacus muscle (series 17, image 21). The visualized gluteus, hamstring and iliopsoas tendons are intact. Partial tear of the right adductor magnus tendon near the ischial tuberosity with surrounding edema. Other findings Miscellaneous: The visualized internal pelvic contents appear unremarkable. IMPRESSION: 1. Left hip septic arthritis. 2. Marrow signal changes in the left medial acetabulum concerning for osteomyelitis. 3. Left gluteus minimus, left piriformis, and left iliacus myositis with developing 3.1 cm abscess in the left iliacus muscle. 4. Mild left greater trochanteric bursitis. 5. Partial tear of the right adductor magnus tendon near the ischial tuberosity. Electronically Signed   By: Titus Dubin M.D.    On: 04/10/2020 11:26   CT ABDOMEN PELVIS W CONTRAST  Result Date: 04/10/2020 CLINICAL DATA:  Left hip and leg pain for 1 week, history of discitis/osteomyelitis EXAM: CT ABDOMEN AND PELVIS WITH CONTRAST TECHNIQUE: Multidetector CT imaging of the abdomen and pelvis was performed using the standard protocol following bolus administration of intravenous contrast. CONTRAST:  148m OMNIPAQUE IOHEXOL 300 MG/ML  SOLN COMPARISON:  07/19/2019, 07/24/2019, 11/09/2019 FINDINGS: Lower chest: No acute pleural or parenchymal lung disease. Hepatobiliary: No focal liver abnormality is seen. No gallstones, gallbladder wall thickening, or biliary dilatation. Pancreas: Unremarkable. No pancreatic ductal dilatation or surrounding inflammatory changes. Spleen: Normal in size without focal abnormality. Adrenals/Urinary Tract: Stable cyst inferolateral left kidney. Otherwise the kidneys enhance normally and symmetrically. No urinary tract calculi or obstruction. Bladder is grossly normal, with limited evaluation of the bladder base due to streak artifact from bilateral hip arthroplasties. Adrenals are unremarkable. Stomach/Bowel: No bowel obstruction or ileus. Normal appendix right lower quadrant. No bowel wall thickening or inflammatory change. Vascular/Lymphatic: Aortic atherosclerosis. Multiple borderline enlarged retroperitoneal and left iliac chain lymph nodes are seen. Largest lymph node on image 64/2 in the left external iliac chain measures up to 8 mm in size. Reproductive: Evaluation of the prostate is limited by streak artifact from bilateral hip arthroplasties. Other: No free fluid or free intraperitoneal gas. No abdominal wall hernia. Musculoskeletal: There are no acute displaced fractures. Disc space narrowing and endplate sclerosis at LB3/X8 likely sequela from previous infection. There is retroperitoneal fat stranding along the left iliac vessels, with asymmetric enlargement of the left iliacus and left gluteal  musculature. Underlying infection cannot be excluded. No fluid collection or abscess. Reconstructed images demonstrate no additional findings. IMPRESSION: 1. Retroperitoneal fat stranding along the left iliac chain, with enlargement of the left iliacus and gluteus musculature. Findings are consistent with underlying inflammation or infection. No fluid collection or abscess at this time. 2. Disc space narrowing and endplate sclerosis at LV2/N1 consistent with sequela of previous infection. 3.  Aortic Atherosclerosis (ICD10-I70.0). Electronically Signed   By: MRanda NgoM.D.   On: 04/10/2020 01:05   DG Hip Unilat With Pelvis 2-3 Views Left  Result Date: 04/09/2020 CLINICAL DATA:  Left hip pain 1 week EXAM: DG HIP (WITH OR WITHOUT PELVIS) 2-3V LEFT COMPARISON:  None. FINDINGS: The patient is status post left total hip arthroplasty. No periprosthetic lucency or fracture is identified. There is also a right total hip arthroplasty. Mild sclerosis around both bilateral sacroiliac joints. IMPRESSION: Left total  hip arthroplasty without complication. Electronically Signed   By: Prudencio Pair M.D.   On: 04/09/2020 15:03   DG FEMUR 1V LEFT  Result Date: 04/09/2020 CLINICAL DATA:  Left hip pain EXAM: LEFT FEMUR 1 VIEW COMPARISON:  None. FINDINGS: Single view radiograph of the left femur demonstrates surgical changes of left total hip arthroplasty. Arthroplasty components overlie the expected position. Mild stress buttressing noted adjacent to the a distal aspect of the femoral stem component. No suspicious lytic or blastic bone lesions. No acute fracture or dislocation. Soft tissues are unremarkable. IMPRESSION: 1. Postsurgical changes of left total hip arthroplasty. No evidence of hardware complication. 2. No acute fracture or dislocation. Electronically Signed   By: Fidela Salisbury MD   On: 04/09/2020 15:05    EKG: ordered  Assessment/Plan Active Problems:   Septic arthritis ([place])  (please populate well all  problems here in Problem List. (For example, if patient is on BP meds at home and you resume or decide to hold them, it is a problem that needs to be her. Same for CAD, COPD, HLD and so on)  Early sepsis secondary to acute left hip septic arthritis and osteomyelitis probably secondary to left gluteus abscess -Sepsis evidenced by tachycardia, fever, leukocytosis, elevated lactate, and organ damage of AKI, tachycardia and lactic acid responded to IV fluid. -Sepsis resolved today -Orthopedic surgery ordered n.p.o., and plan to take patient to or along with IR to decide aspiration plus left hip washout. Both IR and Orthopedic were consulted. -Orthopedic surgery requested that antibiotics starting after OR -Plan for vancomycin and cefepime which was given x1 in the ER.  Consult pharmacy to redose.  Acute left sacrum and iliac osteomyelitis -MRI showed evidence concerning about sacrum likely a component SOS left sacroiliac joint osteomyelitis -Depends on microbiology obtained for OR, plan for 6 to 8 weeks IV antibiotics.  Consider consult ID.  AKI -Start maintenance IV fluid since patient n.p.o. -Recheck BMP  Acute hyponatremia -Recheck BMP now, suspect this is related to sepsis and acute hip pain, hold further workup for now.  Anxiety/depression -Continue SSRI  DVT prophylaxis: Lovenox  code Status: Full code Family Communication: None at bedside Disposition Plan: Patient with acute onset of gluteal abscess, left hip osteomyelitis and septic arthritis, need aspiration, washout and debridement.  Will need more than 2 midnight hospital stay and long-term antibiotics. Consults called: IR and orthopedic surgery Admission status: MedSurg (Sepsis resolved)   Lequita Halt MD [facility] Pager [phone]  04/10/2020, 1:28 PM

## 2020-04-11 ENCOUNTER — Inpatient Hospital Stay (HOSPITAL_COMMUNITY): Payer: BC Managed Care – PPO

## 2020-04-11 ENCOUNTER — Encounter (HOSPITAL_COMMUNITY)
Admission: EM | Disposition: A | Discharge status: Home Health | Payer: Self-pay | Source: Home / Self Care | Attending: Family Medicine

## 2020-04-11 ENCOUNTER — Inpatient Hospital Stay (HOSPITAL_COMMUNITY): Payer: BC Managed Care – PPO | Admitting: Certified Registered Nurse Anesthetist

## 2020-04-11 ENCOUNTER — Encounter (HOSPITAL_COMMUNITY): Payer: Self-pay | Admitting: Internal Medicine

## 2020-04-11 DIAGNOSIS — R7881 Bacteremia: Secondary | ICD-10-CM | POA: Diagnosis present

## 2020-04-11 DIAGNOSIS — M00052 Staphylococcal arthritis, left hip: Secondary | ICD-10-CM

## 2020-04-11 DIAGNOSIS — T8452XA Infection and inflammatory reaction due to internal left hip prosthesis, initial encounter: Secondary | ICD-10-CM | POA: Diagnosis present

## 2020-04-11 DIAGNOSIS — G8929 Other chronic pain: Secondary | ICD-10-CM | POA: Diagnosis present

## 2020-04-11 DIAGNOSIS — M25552 Pain in left hip: Secondary | ICD-10-CM

## 2020-04-11 DIAGNOSIS — B9561 Methicillin susceptible Staphylococcus aureus infection as the cause of diseases classified elsewhere: Secondary | ICD-10-CM

## 2020-04-11 HISTORY — PX: TOTAL HIP ARTHROPLASTY: SHX124

## 2020-04-11 LAB — BASIC METABOLIC PANEL
Anion gap: 8 (ref 5–15)
BUN: 25 mg/dL — ABNORMAL HIGH (ref 6–20)
CO2: 22 mmol/L (ref 22–32)
Calcium: 8.6 mg/dL — ABNORMAL LOW (ref 8.9–10.3)
Chloride: 102 mmol/L (ref 98–111)
Creatinine, Ser: 1.36 mg/dL — ABNORMAL HIGH (ref 0.61–1.24)
GFR calc Af Amer: >60 mL/min (ref >60)
GFR calc non Af Amer: 59 mL/min — ABNORMAL LOW (ref >60)
Glucose, Bld: 104 mg/dL — ABNORMAL HIGH (ref 70–99)
Potassium: 4.2 mmol/L (ref 3.5–5.1)
Sodium: 132 mmol/L — ABNORMAL LOW (ref 135–145)

## 2020-04-11 LAB — SURGICAL PCR SCREEN
MRSA, PCR: POSITIVE — AB
Staphylococcus aureus: POSITIVE — AB

## 2020-04-11 LAB — HEPATIC FUNCTION PANEL
ALT: 51 U/L — ABNORMAL HIGH (ref 0–44)
AST: 48 U/L — ABNORMAL HIGH (ref 15–41)
Albumin: 2.2 g/dL — ABNORMAL LOW (ref 3.5–5.0)
Alkaline Phosphatase: 156 U/L — ABNORMAL HIGH (ref 38–126)
Bilirubin, Direct: 0.3 mg/dL — ABNORMAL HIGH (ref 0.0–0.2)
Indirect Bilirubin: 0.7 mg/dL (ref 0.3–0.9)
Total Bilirubin: 1 mg/dL (ref 0.3–1.2)
Total Protein: 7 g/dL (ref 6.5–8.1)

## 2020-04-11 LAB — CBC
HCT: 35.3 % — ABNORMAL LOW (ref 39.0–52.0)
Hemoglobin: 11.5 g/dL — ABNORMAL LOW (ref 13.0–17.0)
MCH: 31.6 pg (ref 26.0–34.0)
MCHC: 32.6 g/dL (ref 30.0–36.0)
MCV: 97 fL (ref 80.0–100.0)
Platelets: 319 10*3/uL (ref 150–400)
RBC: 3.64 MIL/uL — ABNORMAL LOW (ref 4.22–5.81)
RDW: 14 % (ref 11.5–15.5)
WBC: 10.1 10*3/uL (ref 4.0–10.5)
nRBC: 0 % (ref 0.0–0.2)

## 2020-04-11 IMAGING — DX DG PORTABLE PELVIS
1 series · 1 of 1 positions shown · non-contrast
Comparison: Pelvis CT, dated [DATE]

CLINICAL DATA: Status post hip replacement.

EXAM:
PORTABLE PELVIS 1-2 VIEWS

[pelvis]
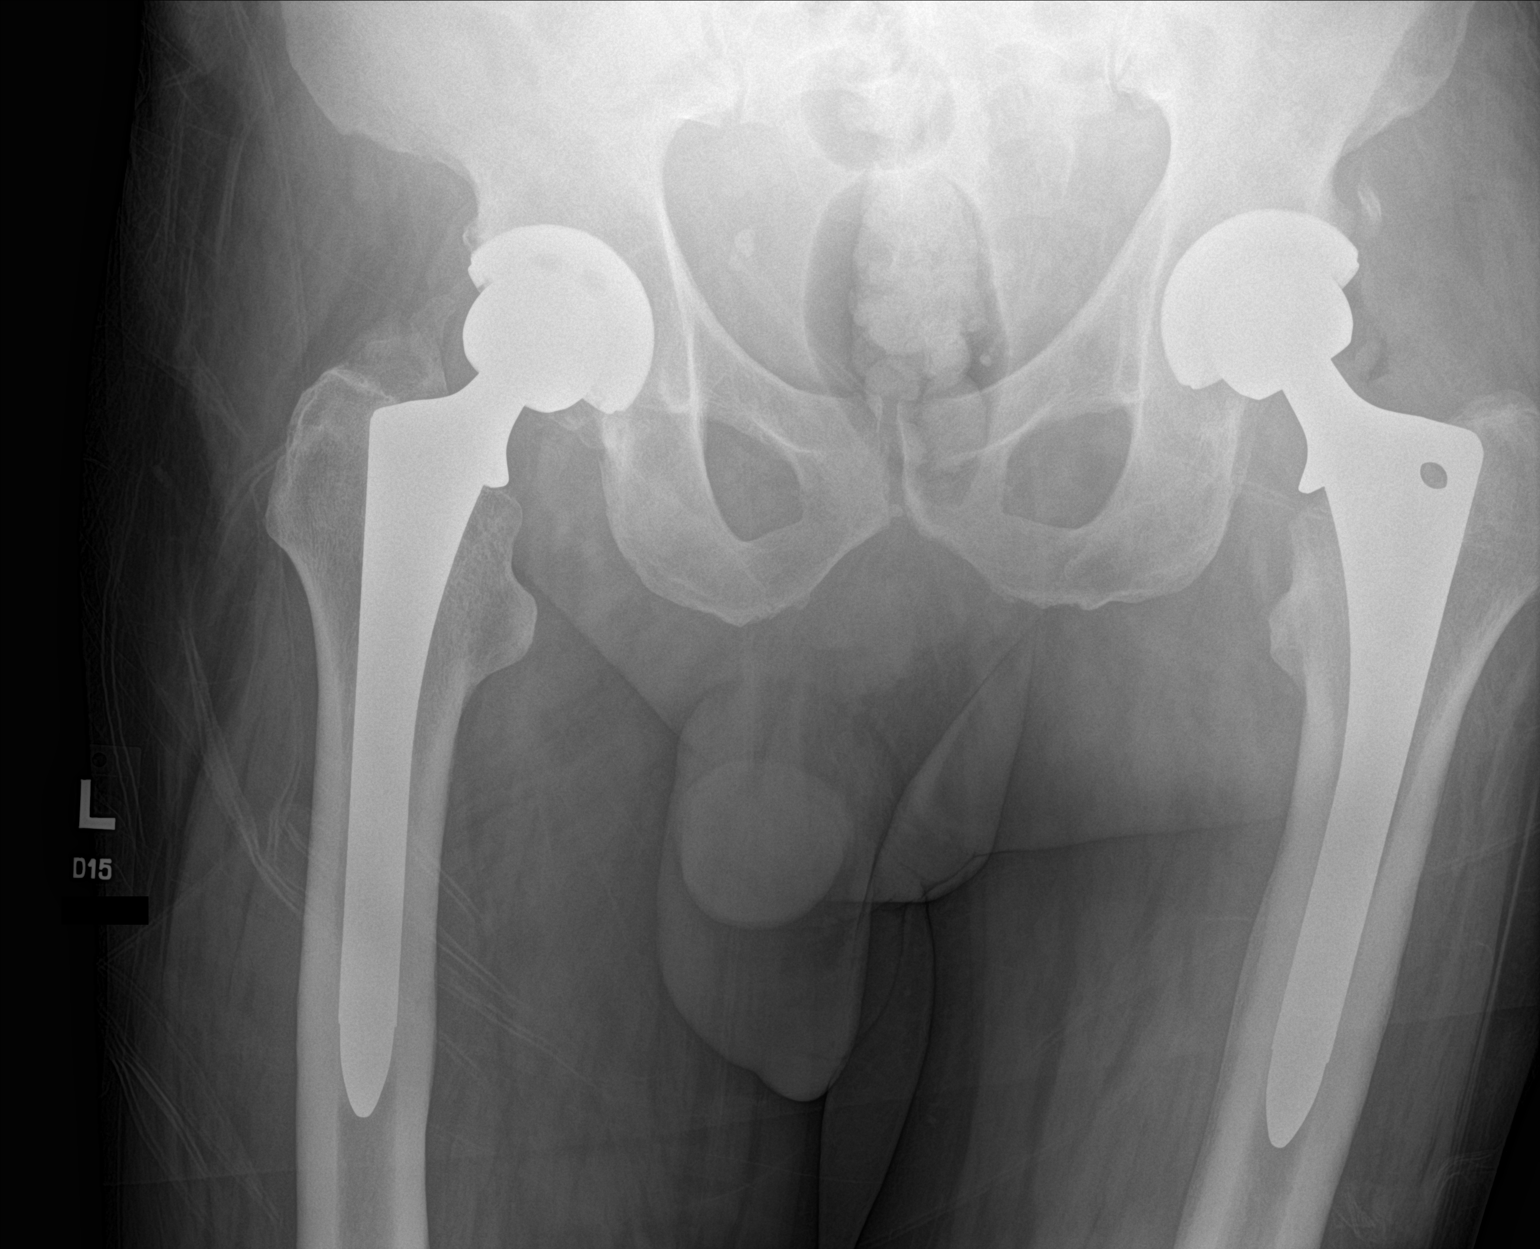

[1 of 1 positions shown; findings below may reference images not displayed]

FINDINGS: Bilateral total hip replacements are seen. There is no evidence of
surrounding lucency to suggest the presence of hardware loosening or
infection. There is no evidence of pelvic fracture or diastasis. No
pelvic bone lesions are seen.
IMPRESSION: Bilateral total hip replacements without evidence of hardware
loosening or infection.

## 2020-04-11 SURGERY — ARTHROPLASTY, HIP, TOTAL,POSTERIOR APPROACH
Anesthesia: General | Site: Hip | Laterality: Left

## 2020-04-11 MED ORDER — PHENOL 1.4 % MT LIQD: 1.0000 | OROMUCOSAL | Status: DC | PRN

## 2020-04-11 MED ORDER — SODIUM CHLORIDE 0.9 % IV SOLN: INTRAVENOUS | Status: DC

## 2020-04-11 MED ORDER — FENTANYL CITRATE (PF) 100 MCG/2ML IJ SOLN
INTRAMUSCULAR | Status: DC | PRN
Start: 2020-04-11 — End: 2020-04-11
  Administered 2020-04-11 (×5): 50 ug via INTRAVENOUS
  Administered 2020-04-11 (×2): 100 ug via INTRAVENOUS
  Administered 2020-04-11: 50 ug via INTRAVENOUS

## 2020-04-11 MED ORDER — ACETAMINOPHEN 500 MG PO TABS
1000.0000 mg | ORAL_TABLET | Freq: Four times a day (QID) | ORAL | Status: AC
  Administered 2020-04-12 (×4): 1000 mg via ORAL
  Filled 2020-04-11 (×4): qty 2

## 2020-04-11 MED ORDER — MAGNESIUM CITRATE PO SOLN: 1.0000 | Freq: Once | ORAL | Status: DC | PRN

## 2020-04-11 MED ORDER — OXYCODONE HCL 5 MG PO TABS
10.0000 mg | ORAL_TABLET | ORAL | Status: DC | PRN
  Administered 2020-04-11 – 2020-04-15 (×7): 15 mg via ORAL
  Administered 2020-04-15: 10 mg via ORAL
  Filled 2020-04-11: qty 2
  Filled 2020-04-11 (×7): qty 3

## 2020-04-11 MED ORDER — DIPHENHYDRAMINE HCL 12.5 MG/5ML PO ELIX: 25.0000 mg | ORAL_SOLUTION | ORAL | Status: DC | PRN

## 2020-04-11 MED ORDER — OXYCODONE HCL 5 MG PO TABS
5.0000 mg | ORAL_TABLET | ORAL | Status: DC | PRN
  Administered 2020-04-12 – 2020-04-14 (×5): 10 mg via ORAL
  Filled 2020-04-11 (×7): qty 2

## 2020-04-11 MED ORDER — ONDANSETRON HCL 4 MG/2ML IJ SOLN
INTRAMUSCULAR | Status: DC | PRN
  Administered 2020-04-11: 4 mg via INTRAVENOUS

## 2020-04-11 MED ORDER — FENTANYL CITRATE (PF) 250 MCG/5ML IJ SOLN
INTRAMUSCULAR | Status: AC
  Filled 2020-04-11: qty 5

## 2020-04-11 MED ORDER — HYDROMORPHONE HCL 1 MG/ML IJ SOLN
0.2500 mg | INTRAMUSCULAR | Status: DC | PRN
  Administered 2020-04-11 (×4): 0.5 mg via INTRAVENOUS

## 2020-04-11 MED ORDER — BUPIVACAINE-EPINEPHRINE (PF) 0.25% -1:200000 IJ SOLN
INTRAMUSCULAR | Status: AC
  Filled 2020-04-11: qty 20

## 2020-04-11 MED ORDER — DEXMEDETOMIDINE (PRECEDEX) IN NS 20 MCG/5ML (4 MCG/ML) IV SYRINGE
PREFILLED_SYRINGE | INTRAVENOUS | Status: DC | PRN
  Administered 2020-04-11 (×2): 10 ug via INTRAVENOUS

## 2020-04-11 MED ORDER — MIDAZOLAM HCL 2 MG/2ML IJ SOLN
INTRAMUSCULAR | Status: AC
  Filled 2020-04-11: qty 2

## 2020-04-11 MED ORDER — ONDANSETRON HCL 4 MG PO TABS: 4.0000 mg | ORAL_TABLET | Freq: Four times a day (QID) | ORAL | Status: DC | PRN

## 2020-04-11 MED ORDER — TRANEXAMIC ACID 1000 MG/10ML IV SOLN
INTRAVENOUS | Status: DC | PRN
  Administered 2020-04-11: 2000 mg via TOPICAL

## 2020-04-11 MED ORDER — VANCOMYCIN HCL 1000 MG IV SOLR
INTRAVENOUS | Status: AC
  Filled 2020-04-11: qty 2000

## 2020-04-11 MED ORDER — 0.9 % SODIUM CHLORIDE (POUR BTL) OPTIME
TOPICAL | Status: DC | PRN
  Administered 2020-04-11: 1000 mL

## 2020-04-11 MED ORDER — DOCUSATE SODIUM 100 MG PO CAPS
100.0000 mg | ORAL_CAPSULE | Freq: Two times a day (BID) | ORAL | Status: DC
  Administered 2020-04-11 – 2020-04-14 (×7): 100 mg via ORAL
  Filled 2020-04-11 (×7): qty 1

## 2020-04-11 MED ORDER — OXYCODONE HCL ER 10 MG PO T12A
10.0000 mg | EXTENDED_RELEASE_TABLET | Freq: Two times a day (BID) | ORAL | Status: DC
  Administered 2020-04-11 – 2020-04-15 (×8): 10 mg via ORAL
  Filled 2020-04-11 (×8): qty 1

## 2020-04-11 MED ORDER — ONDANSETRON HCL 4 MG/2ML IJ SOLN: 4.0000 mg | Freq: Four times a day (QID) | INTRAMUSCULAR | Status: DC | PRN

## 2020-04-11 MED ORDER — ADULT MULTIVITAMIN W/MINERALS CH
1.0000 | ORAL_TABLET | Freq: Every day | ORAL | Status: DC
  Administered 2020-04-12 – 2020-04-15 (×4): 1 via ORAL
  Filled 2020-04-11 (×4): qty 1

## 2020-04-11 MED ORDER — LACTATED RINGERS IV SOLN: INTRAVENOUS | Status: DC

## 2020-04-11 MED ORDER — DEXAMETHASONE SODIUM PHOSPHATE 10 MG/ML IJ SOLN
INTRAMUSCULAR | Status: AC
  Filled 2020-04-11: qty 1

## 2020-04-11 MED ORDER — MENTHOL 3 MG MT LOZG: 1.0000 | LOZENGE | OROMUCOSAL | Status: DC | PRN

## 2020-04-11 MED ORDER — METHOCARBAMOL 1000 MG/10ML IJ SOLN
500.0000 mg | Freq: Once | INTRAVENOUS | Status: AC
  Administered 2020-04-11: 500 mg via INTRAVENOUS
  Filled 2020-04-11: qty 5

## 2020-04-11 MED ORDER — ACETAMINOPHEN 10 MG/ML IV SOLN
1000.0000 mg | Freq: Once | INTRAVENOUS | Status: DC | PRN
  Administered 2020-04-11: 1000 mg via INTRAVENOUS

## 2020-04-11 MED ORDER — CHLORHEXIDINE GLUCONATE 0.12 % MT SOLN
OROMUCOSAL | Status: AC
  Administered 2020-04-11: 15 mL via OROMUCOSAL
  Filled 2020-04-11: qty 15

## 2020-04-11 MED ORDER — PHENYLEPHRINE 40 MCG/ML (10ML) SYRINGE FOR IV PUSH (FOR BLOOD PRESSURE SUPPORT)
PREFILLED_SYRINGE | INTRAVENOUS | Status: AC
  Filled 2020-04-11: qty 10

## 2020-04-11 MED ORDER — METOCLOPRAMIDE HCL 5 MG/ML IJ SOLN: 5.0000 mg | Freq: Three times a day (TID) | INTRAMUSCULAR | Status: DC | PRN

## 2020-04-11 MED ORDER — TRANEXAMIC ACID 1000 MG/10ML IV SOLN
2000.0000 mg | Freq: Once | INTRAVENOUS | Status: DC
  Filled 2020-04-11: qty 20

## 2020-04-11 MED ORDER — HYDROMORPHONE HCL 1 MG/ML IJ SOLN
0.2500 mg | INTRAMUSCULAR | Status: DC | PRN
  Administered 2020-04-11 (×2): 0.5 mg via INTRAVENOUS

## 2020-04-11 MED ORDER — VANCOMYCIN HCL 1000 MG IV SOLR
INTRAVENOUS | Status: DC | PRN
  Administered 2020-04-11 (×2): 1000 mg

## 2020-04-11 MED ORDER — ENOXAPARIN SODIUM 40 MG/0.4ML ~~LOC~~ SOLN
40.0000 mg | SUBCUTANEOUS | Status: DC
  Administered 2020-04-12 – 2020-04-15 (×4): 40 mg via SUBCUTANEOUS
  Filled 2020-04-11 (×4): qty 0.4

## 2020-04-11 MED ORDER — ENSURE MAX PROTEIN PO LIQD
11.0000 [oz_av] | Freq: Two times a day (BID) | ORAL | Status: DC
  Administered 2020-04-11 – 2020-04-14 (×3): 11 [oz_av] via ORAL
  Filled 2020-04-11 (×10): qty 330

## 2020-04-11 MED ORDER — POLYETHYLENE GLYCOL 3350 17 G PO PACK: 17.0000 g | PACK | Freq: Every day | ORAL | Status: DC | PRN

## 2020-04-11 MED ORDER — EPHEDRINE SULFATE-NACL 50-0.9 MG/10ML-% IV SOSY
PREFILLED_SYRINGE | INTRAVENOUS | Status: DC | PRN
  Administered 2020-04-11: 200 mg via INTRAVENOUS

## 2020-04-11 MED ORDER — PHENYLEPHRINE 40 MCG/ML (10ML) SYRINGE FOR IV PUSH (FOR BLOOD PRESSURE SUPPORT)
PREFILLED_SYRINGE | INTRAVENOUS | Status: DC | PRN
  Administered 2020-04-11: 120 ug via INTRAVENOUS

## 2020-04-11 MED ORDER — PROPOFOL 10 MG/ML IV BOLUS
INTRAVENOUS | Status: DC | PRN
  Administered 2020-04-11: 150 mg via INTRAVENOUS

## 2020-04-11 MED ORDER — SORBITOL 70 % SOLN: 30.0000 mL | Freq: Every day | Status: DC | PRN

## 2020-04-11 MED ORDER — MIDAZOLAM HCL 2 MG/2ML IJ SOLN
INTRAMUSCULAR | Status: DC | PRN
  Administered 2020-04-11: 2 mg via INTRAVENOUS

## 2020-04-11 MED ORDER — ALUM & MAG HYDROXIDE-SIMETH 200-200-20 MG/5ML PO SUSP: 30.0000 mL | ORAL | Status: DC | PRN

## 2020-04-11 MED ORDER — ACETAMINOPHEN 325 MG PO TABS: 325.0000 mg | ORAL_TABLET | Freq: Four times a day (QID) | ORAL | Status: DC | PRN

## 2020-04-11 MED ORDER — ORAL CARE MOUTH RINSE: 15.0000 mL | Freq: Once | OROMUCOSAL | Status: AC

## 2020-04-11 MED ORDER — METOCLOPRAMIDE HCL 5 MG PO TABS: 5.0000 mg | ORAL_TABLET | Freq: Three times a day (TID) | ORAL | Status: DC | PRN

## 2020-04-11 MED ORDER — DEXAMETHASONE SODIUM PHOSPHATE 10 MG/ML IJ SOLN
INTRAMUSCULAR | Status: DC | PRN
  Administered 2020-04-11: 10 mg via INTRAVENOUS

## 2020-04-11 MED ORDER — ROCURONIUM BROMIDE 10 MG/ML (PF) SYRINGE
PREFILLED_SYRINGE | INTRAVENOUS | Status: DC | PRN
  Administered 2020-04-11: 60 mg via INTRAVENOUS
  Administered 2020-04-11: 40 mg via INTRAVENOUS

## 2020-04-11 MED ORDER — CHLORHEXIDINE GLUCONATE 0.12 % MT SOLN: 15.0000 mL | Freq: Once | OROMUCOSAL | Status: AC

## 2020-04-11 MED ORDER — HYDROMORPHONE HCL 1 MG/ML IJ SOLN
INTRAMUSCULAR | Status: AC
  Filled 2020-04-11: qty 1

## 2020-04-11 MED ORDER — RIFAMPIN 300 MG PO CAPS
600.0000 mg | ORAL_CAPSULE | Freq: Every day | ORAL | Status: DC
  Administered 2020-04-12 – 2020-04-15 (×4): 600 mg via ORAL
  Filled 2020-04-11 (×6): qty 2

## 2020-04-11 MED ORDER — HYDROMORPHONE HCL 1 MG/ML IJ SOLN
0.5000 mg | INTRAMUSCULAR | Status: DC | PRN
  Administered 2020-04-13 – 2020-04-15 (×7): 1 mg via INTRAVENOUS
  Filled 2020-04-11 (×7): qty 1

## 2020-04-11 MED ORDER — SODIUM CHLORIDE 0.9 % IR SOLN
Status: DC | PRN
  Administered 2020-04-11 (×2): 3000 mL
  Administered 2020-04-11: 1000 mL

## 2020-04-11 MED ORDER — BUPIVACAINE LIPOSOME 1.3 % IJ SUSP
20.0000 mL | Freq: Once | INTRAMUSCULAR | Status: DC
  Filled 2020-04-11: qty 20

## 2020-04-11 MED ORDER — ONDANSETRON HCL 4 MG/2ML IJ SOLN
INTRAMUSCULAR | Status: AC
  Filled 2020-04-11: qty 2

## 2020-04-11 MED ORDER — LIDOCAINE 2% (20 MG/ML) 5 ML SYRINGE
INTRAMUSCULAR | Status: DC | PRN
  Administered 2020-04-11: 60 mg via INTRAVENOUS

## 2020-04-11 MED ORDER — ACETAMINOPHEN 10 MG/ML IV SOLN
INTRAVENOUS | Status: AC
  Filled 2020-04-11: qty 100

## 2020-04-11 SURGICAL SUPPLY — 68 items
ALCOHOL 70% 16 OZ (MISCELLANEOUS) ×2 IMPLANT
BLADE CLIPPER SURG (BLADE) ×3 IMPLANT
BRUSH FEMORAL CANAL (MISCELLANEOUS) IMPLANT
BRUSH SCRUB EZ  4% CHG (MISCELLANEOUS) ×6
BRUSH SCRUB EZ 4% CHG (MISCELLANEOUS) IMPLANT
COVER SURGICAL LIGHT HANDLE (MISCELLANEOUS) ×3 IMPLANT
COVER WAND RF STERILE (DRAPES) ×1 IMPLANT
DRAPE HALF SHEET 40X57 (DRAPES) ×6 IMPLANT
DRAPE HIP W/POCKET STRL (MISCELLANEOUS) ×3 IMPLANT
DRAPE INCISE IOBAN 66X45 STRL (DRAPES) ×1 IMPLANT
DRAPE ORTHO SPLIT 77X108 STRL (DRAPES) ×6
DRAPE SURG 17X23 STRL (DRAPES) ×2 IMPLANT
DRAPE SURG ORHT 6 SPLT 77X108 (DRAPES) ×2 IMPLANT
DRAPE U-SHAPE 47X51 STRL (DRAPES) ×3 IMPLANT
DRSG AQUACEL AG ADV 3.5X14 (GAUZE/BANDAGES/DRESSINGS) ×2 IMPLANT
DURAPREP 26ML APPLICATOR (WOUND CARE) ×9 IMPLANT
ELECT BLADE 6.5 EXT (BLADE) IMPLANT
ELECT CAUTERY BLADE 6.4 (BLADE) IMPLANT
ELECT REM PT RETURN 9FT ADLT (ELECTROSURGICAL) ×3
ELECTRODE REM PT RTRN 9FT ADLT (ELECTROSURGICAL) ×1 IMPLANT
EVACUATOR 1/8 PVC DRAIN (DRAIN) IMPLANT
FILTER STRAW FLUID ASPIR (MISCELLANEOUS) ×1 IMPLANT
GLOVE BIOGEL PI IND STRL 7.0 (GLOVE) ×1 IMPLANT
GLOVE BIOGEL PI INDICATOR 7.0 (GLOVE) ×4
GLOVE ECLIPSE 6.5 STRL STRAW (GLOVE) ×6 IMPLANT
GLOVE SKINSENSE NS SZ7.5 (GLOVE)
GLOVE SKINSENSE STRL SZ7.5 (GLOVE) ×1 IMPLANT
GLOVE SURG SYN 7.5  E (GLOVE) ×9
GLOVE SURG SYN 7.5 E (GLOVE) ×3 IMPLANT
GLOVE SURG SYN 7.5 PF PI (GLOVE) ×2 IMPLANT
HANDPIECE INTERPULSE COAX TIP (DISPOSABLE) ×3
HEAD CERAMIC 36 PLUS5 (Hips) ×2 IMPLANT
HOOD PEEL AWAY FLYTE STAYCOOL (MISCELLANEOUS) ×9 IMPLANT
IV NS IRRIG 3000ML ARTHROMATIC (IV SOLUTION) ×2 IMPLANT
KIT BASIN OR (CUSTOM PROCEDURE TRAY) ×3 IMPLANT
KIT TURNOVER KIT B (KITS) ×3 IMPLANT
LINER NEUTRAL 52X36X54 PLUS 4 (Liner) ×2 IMPLANT
MANIFOLD NEPTUNE II (INSTRUMENTS) ×3 IMPLANT
NDL SPNL 18GX3.5 QUINCKE PK (NEEDLE) ×1 IMPLANT
NEEDLE SPNL 18GX3.5 QUINCKE PK (NEEDLE) ×3 IMPLANT
NS IRRIG 1000ML POUR BTL (IV SOLUTION) ×3 IMPLANT
PACK TOTAL JOINT (CUSTOM PROCEDURE TRAY) ×3 IMPLANT
PAD ARMBOARD 7.5X6 YLW CONV (MISCELLANEOUS) ×6 IMPLANT
PASSER SUT SWANSON 36MM LOOP (INSTRUMENTS) ×1 IMPLANT
SEALER BIPOLAR AQUA 6.0 (INSTRUMENTS) ×2 IMPLANT
SET HNDPC FAN SPRY TIP SCT (DISPOSABLE) IMPLANT
SPONGE LAP 18X18 RF (DISPOSABLE) IMPLANT
STAPLER VISISTAT 35W (STAPLE) ×4 IMPLANT
SUT ETHIBOND NAB CT1 #1 30IN (SUTURE) ×1 IMPLANT
SUT ETHILON 2 0 PSLX (SUTURE) IMPLANT
SUT MON AB 2-0 SH 27 (SUTURE) ×12
SUT MON AB 2-0 SH27 (SUTURE) IMPLANT
SUT PDS AB 0 CT 36 (SUTURE) ×5 IMPLANT
SUT PDS AB 1 CT  36 (SUTURE) ×3
SUT PDS AB 1 CT 36 (SUTURE) ×1 IMPLANT
SUT VIC AB 1 CT1 27 (SUTURE)
SUT VIC AB 1 CT1 27XBRD ANTBC (SUTURE) ×2 IMPLANT
SUT VIC AB 2-0 CT1 27 (SUTURE)
SUT VIC AB 2-0 CT1 TAPERPNT 27 (SUTURE) ×2 IMPLANT
SWAB COLLECTION DEVICE MRSA (MISCELLANEOUS) ×2 IMPLANT
SWAB CULTURE ESWAB REG 1ML (MISCELLANEOUS) ×2 IMPLANT
SYR 50ML LL SCALE MARK (SYRINGE) ×3 IMPLANT
SYR TB 1ML LUER SLIP (SYRINGE) ×1 IMPLANT
TOWEL GREEN STERILE (TOWEL DISPOSABLE) ×3 IMPLANT
TOWEL GREEN STERILE FF (TOWEL DISPOSABLE) ×3 IMPLANT
TOWER CARTRIDGE SMART MIX (DISPOSABLE) IMPLANT
WATER STERILE IRR 1000ML POUR (IV SOLUTION) ×5 IMPLANT
YANKAUER SUCT BULB TIP NO VENT (SUCTIONS) ×2 IMPLANT

## 2020-04-11 NOTE — Anesthesia Procedure Notes (Signed)
Procedure Name: Intubation Date/Time: 04/11/2020 2:33 PM Performed by: Lance Coon, CRNA Pre-anesthesia Checklist: Patient identified, Emergency Drugs available, Suction available, Patient being monitored and Timeout performed Patient Re-evaluated:Patient Re-evaluated prior to induction Oxygen Delivery Method: Circle system utilized Preoxygenation: Pre-oxygenation with 100% oxygen Induction Type: IV induction Ventilation: Mask ventilation without difficulty Laryngoscope Size: Miller and 3 Grade View: Grade I Tube type: Oral Tube size: 7.5 mm Number of attempts: 1 Airway Equipment and Method: Stylet Placement Confirmation: ETT inserted through vocal cords under direct vision,  positive ETCO2 and breath sounds checked- equal and bilateral Secured at: 22 cm Tube secured with: Tape Dental Injury: Teeth and Oropharynx as per pre-operative assessment

## 2020-04-11 NOTE — Progress Notes (Signed)
Initial Nutrition Assessment  DOCUMENTATION CODES:   Obesity unspecified  INTERVENTION:   -Once diet is advanced, add:   -Ensure Max po BID, each supplement provides 150 kcal and 30 grams of protein -MVI with minerals daily  NUTRITION DIAGNOSIS:   Increased nutrient needs related to post-op healing as evidenced by estimated needs.  GOAL:   Patient will meet greater than or equal to 90% of their needs  MONITOR:   PO intake, Supplement acceptance, Diet advancement, Labs, Weight trends, Skin, I & O's  REASON FOR ASSESSMENT:   Malnutrition Screening Tool    ASSESSMENT:   [name] is a 54 y.o. male with medical history significant of lumbar epidural abscess, spinal stenosis status post decompression and laminectomy L3-4, L4-L5, and CT aspiration of right psoas abscess, MSSA bacteremia in December 2020, with extensive hospital stay, IV antibiotics (total of 6 weeks till Feb 2021) and wound care with the wound VAC on the sacral pressure ulcer until June this year, and 2 small wound well healed and patient was discharged home with her on July/14.  Patient reports that been doing well no more open wound on his back.  Saturday morning, he woke up with severe left hip pain, sudden onset, denies any fever chills.  No back pain.  Pain was sharp like, episodic, worsening with putting weight on or walk on the left hip.  Denied any superficial infection, no fever chills last week, no urinary symptoms no diarrhea no URI symptoms including cough shortness of breath runny nose all week last week.  Pt admitted with early sepsis secondary to acute left hip septic arthritis and osteomyelitis probably secondary to left gluteus abscess  Reviewed I/O's: 0 ml x 24 hours  Per orthopedic notes, pt left hip joint aspiration is consistent with prosthetic joint infection; plan for I&D and head and liner exchange today.   Spoke with pt at bedside, who reports feeling poorly due to discomfort. He reports  he "eats all the time" and has a great appetite PTA ("I eat whenever and whatever I want").   Pt shares that he has experienced a 50 pound weight loss over the past year. He reports UBW is around 270#. Reviewed wt hx; pt wt has been stable over the past 6 months.  Pt shares decline in mobility due to illness- was getting around with a walker PTA, however, mobility has been difficult over the past week.   Discussed importance of good meal and supplement intake to promote healing.   Medications reviewed and include 0.9% sodium chloride infusion @ 100 ml/hr.   Labs reviewed: Na: 132.   NUTRITION - FOCUSED PHYSICAL EXAM:    Most Recent Value  Orbital Region No depletion  Upper Arm Region No depletion  Thoracic and Lumbar Region No depletion  Buccal Region No depletion  Temple Region No depletion  Clavicle Bone Region No depletion  Clavicle and Acromion Bone Region No depletion  Scapular Bone Region No depletion  Dorsal Hand No depletion  Patellar Region No depletion  Anterior Thigh Region No depletion  Posterior Calf Region No depletion  Edema (RD Assessment) None  Hair Reviewed  Eyes Reviewed  Mouth Reviewed  Skin Reviewed  Nails Reviewed       Diet Order:   Diet Order            Diet NPO time specified  Diet effective midnight                 EDUCATION NEEDS:  Education needs have been addressed  Skin:  Skin Assessment: Reviewed RN Assessment  Last BM:  Unknown  Height:   Ht Readings from Last 1 Encounters:  04/11/20 6' (1.829 m)    Weight:   Wt Readings from Last 1 Encounters:  04/11/20 105.2 kg    Ideal Body Weight:  80.9 kg  BMI:  Body mass index is 31.46 kg/m.  Estimated Nutritional Needs:   Kcal:  2200-2400  Protein:  120-135 grams  Fluid:  > 2 L    Loistine Chance, RD, LDN, [place] Registered Dietitian II Certified Diabetes Care and Education Specialist Please refer to [facility] for RD and/or RD on-call/weekend/after hours pager

## 2020-04-11 NOTE — Transfer of Care (Signed)
Immediate Anesthesia Transfer of Care Note  Patient: FABION GATSON  Procedure(s) Performed: LEFT HIP IRRIGATION AND DEBRIDEMENT WITH HEAD AND POLY SWAP (Left Hip)  Patient Location: PACU  Anesthesia Type:General  Level of Consciousness: drowsy and patient cooperative  Airway & Oxygen Therapy: Patient Spontanous Breathing  Post-op Assessment: Report given to RN and Post -op Vital signs reviewed and stable  Post vital signs: Reviewed and stable  Last Vitals:  Vitals Value Taken Time  BP 117/79 04/11/20 1627  Temp    Pulse 82 04/11/20 1628  Resp 14 04/11/20 1628  SpO2 95 % 04/11/20 1628  Vitals shown include unvalidated device data.  Last Pain:  Vitals:   04/11/20 1410  TempSrc:   PainSc: 10-Worst pain ever         Complications: No complications documented.

## 2020-04-11 NOTE — Consult Note (Signed)
[facility] for Infectious Disease    Date of Admission:  04/09/2020                  Day 2 cefazolin       Reason for Consult: Recurrent MSSA bacteremia and left prosthetic hip infection    Referring Provider: Automatic consultation for MSSA bacteremia  Assessment: Given that he has a very acute presentation and his MRI already shows evidence of osteomyelitis I strongly suspect that he had unrecognized left prosthetic hip infection beginning in December of last year that was undertreated. I will continue cefazolin and start rifampin. He needs an echocardiogram and will need a repeat lumbar MRI once stable after today's hip surgery.  Plan: 1. Continue cefazolin 2. Start oral rifampin 3. Repeat blood cultures 4. TTE 5. Recommend lumbar MRI in the near future 6. Please call Dr. Dietrich [place] [phone]) for any infectious disease questions this weekend  Principal Problem:   Infection of left prosthetic hip joint ([place]) Active Problems:   H/O MSSA epidural abscess, L2-L5   MSSA bacteremia   Acute low back pain   Scheduled Meds: . escitalopram  20 mg Oral Daily  . mupirocin ointment  1 application Nasal BID   Continuous Infusions: . sodium chloride 100 mL/hr at 04/11/20 0129  .  ceFAZolin (ANCEF) IV 2 g (04/11/20 0132)   PRN Meds:.acetaminophen, cyclobenzaprine, HYDROmorphone (DILAUDID) injection, ketorolac, ondansetron **OR** ondansetron (ZOFRAN) IV, pantoprazole  HPI: [name] is a 54 y.o. male who had MSSA bacteremia and lumbar infection late last December. He underwent lumbar decompression and laminectomy followed by 54 days of IV antibiotic therapy completing treatment on 09/17/2019. His infection appeared to have been cured. He did well and was slowly improving until he woke up on the morning of 04/05/2020 with severe left hip pain. He underwent MRI scan which showed a left acetabular osteomyelitis, adjacent myositis and a small iliac is abscess. Repeat  blood cultures are growing MSSA again. He has also developed acute severe low back pain again.   Review of Systems: Review of Systems  Constitutional: Positive for chills, fever and malaise/fatigue. Negative for diaphoresis and weight loss.  Respiratory: Negative for cough and shortness of breath.   Cardiovascular: Negative for chest pain.  Gastrointestinal: Negative for abdominal pain, diarrhea, nausea and vomiting.  Genitourinary: Negative for dysuria.  Musculoskeletal: Positive for back pain and joint pain.    Past Medical History:  Diagnosis Date  . Cauda equina syndrome ([place]) 07/31/2019  . Cerebral septic emboli ([place]) 07/30/2019  . History of chicken pox   . Medical history non-contributory     Social History   Tobacco Use  . Smoking status: Former Smoker    Packs/day: 1.00    Years: 14.00    Pack years: 14.00    Types: Cigarettes    Quit date: 07/27/1999    Years since quitting: 20.7  . Smokeless tobacco: Never Used  Vaping Use  . Vaping Use: Never used  Substance Use Topics  . Alcohol use: Yes    Alcohol/week: 24.0 standard drinks    Types: 24 Cans of beer per week    Comment: 4-5 beers per day, case of beer per week  . Drug use: No    Family History  Problem Relation Age of Onset  . Cancer Father        Hodgkin's disease  . COPD Mother   . Heart attack Maternal Grandfather 80  .  Diabetes Neg Hx   . Stroke Neg Hx   . Hypertension Neg Hx   . Hyperlipidemia Neg Hx    Allergies  Allergen Reactions  . Bee Venom Anaphylaxis  . Hydrocodone Nausea And Vomiting    (12/10/19 - pt says he has taken without issues)    OBJECTIVE: Blood pressure 107/64, pulse 67, temperature 98.8 F (37.1 C), temperature source Oral, resp. rate 17, height 6' (1.829 m), weight 105.2 kg, SpO2 99 %.  Physical Exam Constitutional:      Comments: He is resting quietly in bed. He is quite frustrated about the setback.  Cardiovascular:     Rate and Rhythm: Normal rate and regular  rhythm.     Heart sounds: No murmur heard.   Pulmonary:     Effort: Pulmonary effort is normal.     Breath sounds: Normal breath sounds.  Abdominal:     Palpations: Abdomen is soft.     Tenderness: There is no abdominal tenderness.  Musculoskeletal:        General: Tenderness present. No swelling.  Skin:    Findings: No rash.  Neurological:     General: No focal deficit present.  Psychiatric:        Mood and Affect: Mood normal.     Lab Results Sed Rate  Date Value  04/09/2020 78 mm/hr (H)  10/09/2019 87 mm/hr (H)  09/03/2019 >130 mm/h (H)   CRP  Date Value  04/10/2020 20.5 mg/dL (H)  09/03/2019 68.5 mg/L (H)  08/13/2019 18.3 mg/dL (H)    Lab Results  Component Value Date   WBC 10.1 04/11/2020   HGB 11.5 (L) 04/11/2020   HCT 35.3 (L) 04/11/2020   MCV 97.0 04/11/2020   PLT 319 04/11/2020    Lab Results  Component Value Date   CREATININE 1.36 (H) 04/11/2020   BUN 25 (H) 04/11/2020   NA 132 (L) 04/11/2020   K 4.2 04/11/2020   CL 102 04/11/2020   CO2 22 04/11/2020    Lab Results  Component Value Date   ALT 51 (H) 04/11/2020   AST 48 (H) 04/11/2020   ALKPHOS 156 (H) 04/11/2020   BILITOT 1.0 04/11/2020     Microbiology: Recent Results (from the past 240 hour(s))  Culture, blood (routine x 2)     Status: Abnormal (Preliminary result)   Collection Time: 04/09/20 10:35 PM   Specimen: BLOOD  Result Value Ref Range Status   Specimen Description   Final    BLOOD LEFT ANTECUBITAL Performed at [facility], [place] [address], [place], [place] [address]    Special Requests   Final    BOTTLES DRAWN AEROBIC AND ANAEROBIC Blood Culture adequate volume Performed at [place] [address], [place], [place] [address]    Culture  Setup Time   Final    GRAM POSITIVE COCCI IN CLUSTERS IN BOTH AEROBIC AND ANAEROBIC BOTTLES CRITICAL RESULT CALLED TO, READ BACK BY AND VERIFIED WITH: Ellin Mayhew [facility] 04/10/20 AT 1650  SK Performed at [facility], [place] [address], [place],  [address]    Culture STAPHYLOCOCCUS AUREUS (A)  Final   Report Status PENDING  Incomplete  Blood Culture ID Panel (Reflexed)     Status: Abnormal   Collection Time: 04/09/20 10:35 PM  Result Value Ref Range Status   Enterococcus faecalis NOT DETECTED NOT DETECTED Final   Enterococcus Faecium NOT DETECTED NOT DETECTED Final   Listeria monocytogenes NOT DETECTED NOT DETECTED Final  Staphylococcus species DETECTED (A) NOT DETECTED Final    Comment: CRITICAL RESULT CALLED TO, READ BACK BY AND VERIFIED WITH: PHARMD M [place] 04/10/20 AT 1650 SK    Staphylococcus aureus (BCID) DETECTED (A) NOT DETECTED Final    Comment: CRITICAL RESULT CALLED TO, READ BACK BY AND VERIFIED WITH: PHARMD M [place] 04/10/20 AT 1650 SK    Staphylococcus epidermidis NOT DETECTED NOT DETECTED Final   Staphylococcus lugdunensis NOT DETECTED NOT DETECTED Final   Streptococcus species NOT DETECTED NOT DETECTED Final   Streptococcus agalactiae NOT DETECTED NOT DETECTED Final   Streptococcus pneumoniae NOT DETECTED NOT DETECTED Final   Streptococcus pyogenes NOT DETECTED NOT DETECTED Final   A.calcoaceticus-baumannii NOT DETECTED NOT DETECTED Final   Bacteroides fragilis NOT DETECTED NOT DETECTED Final   Enterobacterales NOT DETECTED NOT DETECTED Final   Enterobacter cloacae complex NOT DETECTED NOT DETECTED Final   Escherichia coli NOT DETECTED NOT DETECTED Final   Klebsiella aerogenes NOT DETECTED NOT DETECTED Final   Klebsiella oxytoca NOT DETECTED NOT DETECTED Final   Klebsiella pneumoniae NOT DETECTED NOT DETECTED Final   Proteus species NOT DETECTED NOT DETECTED Final   Salmonella species NOT DETECTED NOT DETECTED Final   Serratia marcescens NOT DETECTED NOT DETECTED Final   Haemophilus influenzae NOT DETECTED NOT DETECTED Final   Neisseria meningitidis NOT DETECTED NOT DETECTED Final   Pseudomonas aeruginosa NOT DETECTED NOT DETECTED Final    Stenotrophomonas maltophilia NOT DETECTED NOT DETECTED Final   Candida albicans NOT DETECTED NOT DETECTED Final   Candida auris NOT DETECTED NOT DETECTED Final   Candida glabrata NOT DETECTED NOT DETECTED Final   Candida krusei NOT DETECTED NOT DETECTED Final   Candida parapsilosis NOT DETECTED NOT DETECTED Final   Candida tropicalis NOT DETECTED NOT DETECTED Final   Cryptococcus neoformans/gattii NOT DETECTED NOT DETECTED Final   Meth resistant mecA/C and MREJ NOT DETECTED NOT DETECTED Final    Comment: Performed at [facility], [address] [place], [place] [address]  SARS Coronavirus 2 by RT PCR (hospital order, performed in [facility] hospital lab) Nasopharyngeal Nasopharyngeal Swab     Status: None   Collection Time: 04/10/20  1:42 AM   Specimen: Nasopharyngeal Swab  Result Value Ref Range Status   SARS Coronavirus 2 NEGATIVE NEGATIVE Final    Comment: (NOTE) SARS-CoV-2 target nucleic acids are NOT DETECTED.  The SARS-CoV-2 RNA is generally detectable in upper and lower respiratory specimens during the acute phase of infection. The lowest concentration of SARS-CoV-2 viral copies this assay can detect is 250 copies / mL. A negative result does not preclude SARS-CoV-2 infection and should not be used as the sole basis for treatment or other patient management decisions.  A negative result may occur with improper specimen collection / handling, submission of specimen other than nasopharyngeal swab, presence of viral mutation(s) within the areas targeted by this assay, and inadequate number of viral copies (<250 copies / mL). A negative result must be combined with clinical observations, patient history, and epidemiological information.  Fact Sheet for Patients:   StrictlyIdeas.no  Fact Sheet for Healthcare Providers: BankingDealers.co.za  This test is not yet approved or  cleared by the [place] FDA and has been  authorized for detection and/or diagnosis of SARS-CoV-2 by FDA under an Emergency Use Authorization (EUA).  This EUA will remain in effect (meaning this test can be used) for the duration of the COVID-19 declaration under Section 564(b)(1) of the Act, 21 U.S.C. section 360bbb-3(b)(1), unless the authorization  is terminated or revoked sooner.  Performed at [facility], [place] [address], [place], [place] [address]   Urine culture     Status: Abnormal (Preliminary result)   Collection Time: 04/10/20  3:00 AM   Specimen: Urine, Clean Catch  Result Value Ref Range Status   Specimen Description   Final    URINE, CLEAN CATCH Performed at [facility], [place] [address], [place], [place] [address]    Special Requests   Final    NONE Performed at [facility], [place] [address], [place], [place] [address]    Culture (A)  Final    >=100,000 COLONIES/mL STAPHYLOCOCCUS AUREUS SUSCEPTIBILITIES TO FOLLOW Performed at [facility], [place] [address], [place], [place] [address]    Report Status PENDING  Incomplete  Body fluid culture     Status: None (Preliminary result)   Collection Time: 04/10/20  4:05 PM   Specimen: Joint, Left Hip; Synovial Fluid  Result Value Ref Range Status   Specimen Description SYNOVIAL LEFT HIP  Final   Special Requests NONE  Final   Gram Stain   Final    ABUNDANT WBC PRESENT,BOTH PMN AND MONONUCLEAR NO ORGANISMS SEEN Performed at [facility], [address], [place],  [address]    Culture PENDING  Incomplete   Report Status PENDING  Incomplete  Surgical PCR screen     Status: Abnormal   Collection Time: 04/10/20 11:00 PM   Specimen: Nasal Mucosa; Nasal Swab  Result Value Ref Range Status   MRSA, PCR POSITIVE (A) NEGATIVE Final    Comment: RESULT CALLED TO, READ BACK BY AND VERIFIED WITH: Clydene Laming RN 04/11/20 0123 JDW    Staphylococcus aureus POSITIVE (A) NEGATIVE Final    Comment:  (NOTE) The Xpert SA Assay (FDA approved for NASAL specimens in patients 52 years of age and older), is one component of a comprehensive surveillance program. It is not intended to diagnose infection nor to guide or monitor treatment. Performed at [facility], [place] [address], [place],  [address]     Michel Bickers, [place] [facility] [place] [facility] [phone] pager   [phone] cell 04/11/2020, 9:31 AM

## 2020-04-11 NOTE — Progress Notes (Signed)
Patient being taken down for procedure.

## 2020-04-11 NOTE — Anesthesia Preprocedure Evaluation (Addendum)
Anesthesia Evaluation  Patient identified by MRN, date of birth, ID band Patient awake    Reviewed: Allergy & Precautions, H&P , NPO status , Patient's Chart, lab work & pertinent test results  Airway Mallampati: II  TM Distance: >3 FB Neck ROM: Full    Dental no notable dental hx. (+) Teeth Intact, Dental Advisory Given   Pulmonary neg pulmonary ROS, former smoker,    Pulmonary exam normal breath sounds clear to auscultation       Cardiovascular negative cardio ROS   Rhythm:Regular Rate:Normal     Neuro/Psych negative neurological ROS  negative psych ROS   GI/Hepatic negative GI ROS, Neg liver ROS,   Endo/Other  negative endocrine ROS  Renal/GU negative Renal ROS  negative genitourinary   Musculoskeletal   Abdominal   Peds  Hematology  (+) Blood dyscrasia, anemia ,   Anesthesia Other Findings   Reproductive/Obstetrics negative OB ROS                            Anesthesia Physical Anesthesia Plan  ASA: II  Anesthesia Plan: General   Post-op Pain Management:    Induction: Intravenous  PONV Risk Score and Plan: 3 and Ondansetron, Dexamethasone and Midazolam  Airway Management Planned: Oral ETT  Additional Equipment:   Intra-op Plan:   Post-operative Plan: Extubation in OR  Informed Consent: I have reviewed the patients History and Physical, chart, labs and discussed the procedure including the risks, benefits and alternatives for the proposed anesthesia with the patient or authorized representative who has indicated his/her understanding and acceptance.     Dental advisory given  Plan Discussed with: CRNA  Anesthesia Plan Comments:         Anesthesia Quick Evaluation

## 2020-04-11 NOTE — Progress Notes (Signed)
Plan for surgery today.  This was discussed with patient this morning.  All questions answered.  Informed consent obtained.  Keep NPO.  Hold lovenox.  Azucena Cecil, MD [facility] [phone] 7:49 AM

## 2020-04-11 NOTE — Progress Notes (Signed)
Patient in bed resting. Consent obtained for procedure today. Patient has complaints of pain and will medicate accordingly.  Patient is NPO awaiting procedure.

## 2020-04-11 NOTE — Progress Notes (Signed)
PROGRESS NOTE    [name]  AYT:[id number] DOB: [birth year] DOA: 04/09/2020 PCP: Lesleigh Noe, MD   Brief Narrative: [name] is a 54 y.o. male with medical history significant of lumbar epidural abscess, spinal stenosis status post decompression and laminectomy L3-4, L4-L5, and CT aspiration of right psoas abscess, MSSA bacteremia in December 2020. Patient presented secondary to left hip pain and found to have evidence of septic arthritis/infected prosthetic hip joint.   Assessment & Plan:   Principal Problem:   Infection of left prosthetic hip joint ([place]) Active Problems:   H/O MSSA epidural abscess, L2-L5   MSSA bacteremia   Acute low back pain   Infected prosthetic hip joint Septic arthritis Left sacral/iliac osteomyelitis Patient does not have sepsis related to these infections. Left hip joint was aspirated with fluid consistent with infected joint. Orthopedic surgery with plans for joint replacement. Patient started empirically on Vancomycin and Cefepime and transitioned to Cefazolin IV. Staphylococcus aureus growing on preliminary cultures without evidence of methicillin resistance on BCID -Infectious disease recommendations: Continue Cefazolin IV, Rifampin, Transthoracic Echocardiogram, Repeat blood cultures, repeat MRI eventually -Orthopedic surgery recommendations: plan for left hip joint surgery today -Repeat blood cultures pending (ordered but not yet collected)  AKI Baseline hemoglobin of 0.7 from 09/2019. Creatinine of 1.37 on admission. Creatinine stable -Continue IV fluids  Hyponatremia Mild -Continue IV fluids  Anxiety Depression -Continue Lexapro   DVT prophylaxis: SCDs Code Status:   Code Status: Full Code Family Communication: None at bedside Disposition Plan: Discharge home vs SNF pending orthopedic and ID recommendations for management of hip infection, osteomyelitis and bacteremia. Anticipate at least 4-5 days   Consultants:    Orthopedic surgery  Infectious disease  Procedures:   None  Antimicrobials:  Vancomycin  Cefepime  Cefazolin    Subjective: Continued hip pain.  Objective: Vitals:   04/11/20 0045 04/11/20 0112 04/11/20 0530 04/11/20 1126  BP: 101/65 105/63 107/64 106/65  Pulse: 73 82 67 70  Resp:  17 17 16   Temp:  98.7 F (37.1 C) 98.8 F (37.1 C) 99.2 F (37.3 C)  TempSrc:   Oral Oral  SpO2: 95% 99% 99% 97%  Weight:  105.2 kg    Height:  6' (1.829 m)      Intake/Output Summary (Last 24 hours) at 04/11/2020 1351 Last data filed at 04/11/2020 0800 Gross per 24 hour  Intake 0 ml  Output --  Net 0 ml   Filed Weights   04/09/20 1402 04/11/20 0112  Weight: 104.3 kg 105.2 kg    Examination:  General exam: Appears calm and comfortable Respiratory system: Clear to auscultation. Respiratory effort normal. Cardiovascular system: S1 & S2 heard, RRR. Gastrointestinal system: Abdomen is nondistended, soft and nontender. No organomegaly or masses felt. Normal bowel sounds heard. Central nervous system: Alert and oriented. No focal neurological deficits. Musculoskeletal: No edema. No calf tenderness Skin: No cyanosis. No rashes Psychiatry: Judgement and insight appear normal. Mood & affect appropriate.     Data Reviewed: I have personally reviewed following labs and imaging studies  CBC Lab Results  Component Value Date   WBC 10.1 04/11/2020   RBC 3.64 (L) 04/11/2020   HGB 11.5 (L) 04/11/2020   HCT 35.3 (L) 04/11/2020   MCV 97.0 04/11/2020   MCH 31.6 04/11/2020   PLT 319 04/11/2020   MCHC 32.6 04/11/2020   RDW 14.0 04/11/2020   LYMPHSABS 0.9 04/10/2020   MONOABS 0.9 04/10/2020   EOSABS 0.0 04/10/2020   BASOSABS 0.0  47/82/9562     Last metabolic panel Lab Results  Component Value Date   NA 132 (L) 04/11/2020   K 4.2 04/11/2020   CL 102 04/11/2020   CO2 22 04/11/2020   BUN 25 (H) 04/11/2020   CREATININE 1.36 (H) 04/11/2020   GLUCOSE 104 (H) 04/11/2020    GFRNONAA 59 (L) 04/11/2020   GFRAA >60 04/11/2020   CALCIUM 8.6 (L) 04/11/2020   PHOS 4.6 08/27/2019   PROT 7.0 04/11/2020   ALBUMIN 2.2 (L) 04/11/2020   BILITOT 1.0 04/11/2020   ALKPHOS 156 (H) 04/11/2020   AST 48 (H) 04/11/2020   ALT 51 (H) 04/11/2020   ANIONGAP 8 04/11/2020    CBG (last 3)  No results for input(s): GLUCAP in the last 72 hours.   GFR: Estimated Creatinine Clearance: 77.8 mL/min (A) (by C-G formula based on SCr of 1.36 mg/dL (H)).  Coagulation Profile: No results for input(s): INR, PROTIME in the last 168 hours.  Recent Results (from the past 240 hour(s))  Culture, blood (routine x 2)     Status: Abnormal (Preliminary result)   Collection Time: 04/09/20 10:35 PM   Specimen: BLOOD  Result Value Ref Range Status   Specimen Description   Final    BLOOD LEFT ANTECUBITAL Performed at [facility] [address] [place], [place] [address]    Special Requests   Final    BOTTLES DRAWN AEROBIC AND ANAEROBIC Blood Culture adequate volume Performed at [facility] [address], [place], [place] [address]    Culture  Setup Time   Final    GRAM POSITIVE COCCI IN CLUSTERS IN BOTH AEROBIC AND ANAEROBIC BOTTLES CRITICAL RESULT CALLED TO, READ BACK BY AND VERIFIED WITH: Ellin Mayhew [facility] 04/10/20 AT 1650 SK Performed at [facility], [place] [address] [place], [place] [address]    Culture STAPHYLOCOCCUS AUREUS (A)  Final   Report Status PENDING  Incomplete  Blood Culture ID Panel (Reflexed)     Status: Abnormal   Collection Time: 04/09/20 10:35 PM  Result Value Ref Range Status   Enterococcus faecalis NOT DETECTED NOT DETECTED Final   Enterococcus Faecium NOT DETECTED NOT DETECTED Final   Listeria monocytogenes NOT DETECTED NOT DETECTED Final   Staphylococcus species DETECTED (A) NOT DETECTED Final    Comment: CRITICAL RESULT CALLED TO, READ BACK BY AND VERIFIED WITH: PHARMD M [place] 04/10/20 AT 1650 SK     Staphylococcus aureus (BCID) DETECTED (A) NOT DETECTED Final    Comment: CRITICAL RESULT CALLED TO, READ BACK BY AND VERIFIED WITH: PHARMD M [place] 04/10/20 AT 1650 SK    Staphylococcus epidermidis NOT DETECTED NOT DETECTED Final   Staphylococcus lugdunensis NOT DETECTED NOT DETECTED Final   Streptococcus species NOT DETECTED NOT DETECTED Final   Streptococcus agalactiae NOT DETECTED NOT DETECTED Final   Streptococcus pneumoniae NOT DETECTED NOT DETECTED Final   Streptococcus pyogenes NOT DETECTED NOT DETECTED Final   A.calcoaceticus-baumannii NOT DETECTED NOT DETECTED Final   Bacteroides fragilis NOT DETECTED NOT DETECTED Final   Enterobacterales NOT DETECTED NOT DETECTED Final   Enterobacter cloacae complex NOT DETECTED NOT DETECTED Final   Escherichia coli NOT DETECTED NOT DETECTED Final   Klebsiella aerogenes NOT DETECTED NOT DETECTED Final   Klebsiella oxytoca NOT DETECTED NOT DETECTED Final   Klebsiella pneumoniae NOT DETECTED NOT DETECTED Final   Proteus species NOT DETECTED NOT DETECTED Final   Salmonella species NOT DETECTED NOT DETECTED Final   Serratia marcescens NOT DETECTED NOT DETECTED  Final   Haemophilus influenzae NOT DETECTED NOT DETECTED Final   Neisseria meningitidis NOT DETECTED NOT DETECTED Final   Pseudomonas aeruginosa NOT DETECTED NOT DETECTED Final   Stenotrophomonas maltophilia NOT DETECTED NOT DETECTED Final   Candida albicans NOT DETECTED NOT DETECTED Final   Candida auris NOT DETECTED NOT DETECTED Final   Candida glabrata NOT DETECTED NOT DETECTED Final   Candida krusei NOT DETECTED NOT DETECTED Final   Candida parapsilosis NOT DETECTED NOT DETECTED Final   Candida tropicalis NOT DETECTED NOT DETECTED Final   Cryptococcus neoformans/gattii NOT DETECTED NOT DETECTED Final   Meth resistant mecA/C and MREJ NOT DETECTED NOT DETECTED Final    Comment: Performed at [facility], [place] [address], [place], [place] [address]  SARS Coronavirus 2 by RT PCR  (hospital order, performed in [facility] hospital lab) Nasopharyngeal Nasopharyngeal Swab     Status: None   Collection Time: 04/10/20  1:42 AM   Specimen: Nasopharyngeal Swab  Result Value Ref Range Status   SARS Coronavirus 2 NEGATIVE NEGATIVE Final    Comment: (NOTE) SARS-CoV-2 target nucleic acids are NOT DETECTED.  The SARS-CoV-2 RNA is generally detectable in upper and lower respiratory specimens during the acute phase of infection. The lowest concentration of SARS-CoV-2 viral copies this assay can detect is 250 copies / mL. A negative result does not preclude SARS-CoV-2 infection and should not be used as the sole basis for treatment or other patient management decisions.  A negative result may occur with improper specimen collection / handling, submission of specimen other than nasopharyngeal swab, presence of viral mutation(s) within the areas targeted by this assay, and inadequate number of viral copies (<250 copies / mL). A negative result must be combined with clinical observations, patient history, and epidemiological information.  Fact Sheet for Patients:   StrictlyIdeas.no  Fact Sheet for Healthcare Providers: BankingDealers.co.za  This test is not yet approved or  cleared by the [place] FDA and has been authorized for detection and/or diagnosis of SARS-CoV-2 by FDA under an Emergency Use Authorization (EUA).  This EUA will remain in effect (meaning this test can be used) for the duration of the COVID-19 declaration under Section 564(b)(1) of the Act, 21 U.S.C. section 360bbb-3(b)(1), unless the authorization is terminated or revoked sooner.  Performed at [facility], [place] [address], [place], [place] [address]   Urine culture     Status: Abnormal (Preliminary result)   Collection Time: 04/10/20  3:00 AM   Specimen: Urine, Clean Catch  Result Value Ref Range Status   Specimen Description    Final    URINE, CLEAN CATCH Performed at [facility], [place] [address] [place], [place] [address]    Special Requests   Final    NONE Performed at [facility], [place] [address] [place], [place] [address]    Culture (A)  Final    >=100,000 COLONIES/mL STAPHYLOCOCCUS AUREUS SUSCEPTIBILITIES TO FOLLOW Performed at [facility], [place] [address], [place], [place] [address]    Report Status PENDING  Incomplete  Body fluid culture     Status: None (Preliminary result)   Collection Time: 04/10/20  4:05 PM   Specimen: Joint, Left Hip; Synovial Fluid  Result Value Ref Range Status   Specimen Description SYNOVIAL LEFT HIP  Final   Special Requests NONE  Final   Gram Stain   Final    ABUNDANT WBC PRESENT,BOTH PMN AND MONONUCLEAR NO ORGANISMS SEEN  Culture   Final    NO GROWTH < 24 HOURS Performed at [facility], [place] [address] [place], [place] [address]    Report Status PENDING  Incomplete  Surgical PCR screen     Status: Abnormal   Collection Time: 04/10/20 11:00 PM   Specimen: Nasal Mucosa; Nasal Swab  Result Value Ref Range Status   MRSA, PCR POSITIVE (A) NEGATIVE Final    Comment: RESULT CALLED TO, READ BACK BY AND VERIFIED WITH: Clydene Laming RN 04/11/20 0123 JDW    Staphylococcus aureus POSITIVE (A) NEGATIVE Final    Comment: (NOTE) The Xpert SA Assay (FDA approved for NASAL specimens in patients 49 years of age and older), is one component of a comprehensive surveillance program. It is not intended to diagnose infection nor to guide or monitor treatment. Performed at [facility], [place] [address], [place], [place] [address]         Radiology Studies: DG Tibia/Fibula Left  Result Date: 04/09/2020 CLINICAL DATA:  Leg pain EXAM: LEFT TIBIA AND FIBULA - 2 VIEW COMPARISON:  None. FINDINGS: There is no evidence of fracture or other focal bone lesions. Soft tissues are unremarkable. IMPRESSION: Negative. Electronically  Signed   By: Fidela Salisbury MD   On: 04/09/2020 15:03   MR Lumbar Spine W Wo Contrast  Result Date: 04/10/2020 CLINICAL DATA:  Low back pain, infection suspected. Additional history provided: Left hip and leg pain for 1 week, history of discitis/osteomyelitis, history of bilateral hip replacements. EXAM: MRI LUMBAR SPINE WITHOUT AND WITH CONTRAST TECHNIQUE: Multiplanar and multiecho pulse sequences of the lumbar spine were obtained without and with intravenous contrast. CONTRAST:  75mL GADAVIST GADOBUTROL 1 MMOL/ML IV SOLN COMPARISON:  Lumbar spine MRI 11/09/2019. CT abdomen/pelvis 04/10/2020. FINDINGS: Segmentation: For the purposes of this dictation, five lumbar vertebrae are assumed and the caudal most well-formed intervertebral disc is designated L5-S1. Alignment: Straightening of the expected lumbar lordosis. L3-L4 and L4-L5 grade 1 retrolisthesis. Vertebrae: Vertebral body height is maintained. Multilevel vertebral body hemangiomas. Multilevel degenerative endplate irregularity with small Schmorl nodes. There is incompletely imaged marrow edema within the left sacrum and left iliac bone abutting the left SI joint (series 6, image 16). These findings raise the possibility of osteomyelitis at this site, possibly with associated sacroiliitis. There is mild edema signal along the margins of the L3-L4 disc space (for instance as seen on series 14, images 4-13). Prominent multilevel ventrolateral osteophytes. Conus medullaris and cauda equina: Conus extends to the L1-L2 level. No signal abnormality within the visualized distal spinal cord. Paraspinal and other soft tissues: There is prominent edema signal and enhancement within the posterior aspect of the left iliopsoas muscle (for instance as seen on series 8, image 40) (series 15, image 38). These findings are consistent with infection/phlegmon. Centrally within this region there are few tiny (subcentimeter) foci of hypoenhancement which could reflect tiny  abscesses (series 15, image 18). Disc levels: Unless otherwise stated, the level by level findings below have not significantly changed since prior MRI 11/09/2019. Multilevel disc degeneration. Most notably, there is moderate disc degeneration at L3-L4. L1-L2: Mild disc bulge and facet arthrosis. No significant spinal canal stenosis or neural foraminal narrowing. L2-L3: Mild disc bulging and facet arthrosis. No significant spinal canal stenosis or neural foraminal narrowing. L3-L4: Grade 1 retrolisthesis. Prior posterior decompression. Disc bulge with endplate spurring. Facet hypertrophy. Mild bilateral subarticular narrowing. Central canal patent. Mild bilateral inferior neural foraminal narrowing. Unchanged mild nonspecific fluid signal and enhancement within  the facet joints bilaterally. L4-L5: Grade 1 retrolisthesis. Prior posterior decompression. Disc bulge with endplate spurring. Facet hypertrophy. Mild bilateral subarticular narrowing. Mild bilateral neural foraminal narrowing. Unchanged mild nonspecific fluid signal and enhancement within the facet joints bilaterally. L5-S1: Bilateral facet arthrosis (severe right, moderate left). No significant disc herniation, spinal canal stenosis or neural foraminal narrowing. IMPRESSION: Incompletely imaged marrow edema within the left sacrum and iliac bone, abutting the left sacroiliac joint. Findings raise the possibility of osteomyelitis at this site, possibly with left sacroiliitis. Consider contrast-enhanced MR imaging of the sacrum and pelvis for further evaluation. Partially imaged prominent edema and enhancement within the left iliopsoas muscle consistent with infection/phlegmon. Centrally within this region, there are several tiny (subcentimeter) foci of relative hypoenhancement which may reflect tiny abscesses. Unchanged subtle enhancement along the margins of the L3-L4 disc space, nonspecific but possibly reflecting mild residual discitis/osteomyelitis. Mild  fluid signal and enhancement within the bilateral L3-L4 and L4-L5 facet joints. These findings are unchanged and also nonspecific. Infection cannot be excluded at these sites. Lumbar spondylosis as outlined and unchanged. No more than mild spinal canal stenosis or neural foraminal narrowing. Electronically Signed   By: Kellie Simmering DO   On: 04/10/2020 11:23   MR HIP LEFT W WO CONTRAST  Result Date: 04/10/2020 CLINICAL DATA:  Left hip and leg pain for the past week. Concern for septic arthritis. History of lumbar osteomyelitis-discitis. Prior bilateral hip arthroplasties. EXAM: MRI OF THE LEFT HIP WITHOUT AND WITH CONTRAST TECHNIQUE: Multiplanar, multisequence MR imaging was performed both before and after administration of intravenous contrast. CONTRAST:  18mL GADAVIST GADOBUTROL 1 MMOL/ML IV SOLN COMPARISON:  CT abdomen pelvis from same day. Left hip x-rays from yesterday. MRI left hip dated December 26, 2012. FINDINGS: Bones: Prior bilateral total hip arthroplasties with associated susceptibility artifact. Focal marrow edema in the left medial acetabulum with corresponding decreased T1 marrow signal (series 4 and 5, images 10-15). There is no evidence of acute fracture, dislocation or avascular necrosis. No focal bone lesion. The visualized sacroiliac joints and symphysis pubis appear normal. Joint or bursal effusion Joint effusion: Large left hip joint effusion extending into the left gluteus minimus muscle with prominent synovial thickening and enhancement. No right hip joint effusion. Bursae: Small amount of fluid in the left greater trochanteric bursa. Muscles and tendons Muscles and tendons: Prominent edema within the left gluteus minimus, left piriformis and, and left iliacus muscles. Inflammatory changes involve the greater sciatic foramen. Developing 3.1 cm abscess in the left iliacus muscle (series 17, image 21). The visualized gluteus, hamstring and iliopsoas tendons are intact. Partial tear of the right  adductor magnus tendon near the ischial tuberosity with surrounding edema. Other findings Miscellaneous: The visualized internal pelvic contents appear unremarkable. IMPRESSION: 1. Left hip septic arthritis. 2. Marrow signal changes in the left medial acetabulum concerning for osteomyelitis. 3. Left gluteus minimus, left piriformis, and left iliacus myositis with developing 3.1 cm abscess in the left iliacus muscle. 4. Mild left greater trochanteric bursitis. 5. Partial tear of the right adductor magnus tendon near the ischial tuberosity. Electronically Signed   By: Titus Dubin M.D.   On: 04/10/2020 11:26   CT ABDOMEN PELVIS W CONTRAST  Result Date: 04/10/2020 CLINICAL DATA:  Left hip and leg pain for 1 week, history of discitis/osteomyelitis EXAM: CT ABDOMEN AND PELVIS WITH CONTRAST TECHNIQUE: Multidetector CT imaging of the abdomen and pelvis was performed using the standard protocol following bolus administration of intravenous contrast. CONTRAST:  122mL OMNIPAQUE IOHEXOL 300 MG/ML  SOLN COMPARISON:  07/19/2019, 07/24/2019, 11/09/2019 FINDINGS: Lower chest: No acute pleural or parenchymal lung disease. Hepatobiliary: No focal liver abnormality is seen. No gallstones, gallbladder wall thickening, or biliary dilatation. Pancreas: Unremarkable. No pancreatic ductal dilatation or surrounding inflammatory changes. Spleen: Normal in size without focal abnormality. Adrenals/Urinary Tract: Stable cyst inferolateral left kidney. Otherwise the kidneys enhance normally and symmetrically. No urinary tract calculi or obstruction. Bladder is grossly normal, with limited evaluation of the bladder base due to streak artifact from bilateral hip arthroplasties. Adrenals are unremarkable. Stomach/Bowel: No bowel obstruction or ileus. Normal appendix right lower quadrant. No bowel wall thickening or inflammatory change. Vascular/Lymphatic: Aortic atherosclerosis. Multiple borderline enlarged retroperitoneal and left iliac  chain lymph nodes are seen. Largest lymph node on image 64/2 in the left external iliac chain measures up to 8 mm in size. Reproductive: Evaluation of the prostate is limited by streak artifact from bilateral hip arthroplasties. Other: No free fluid or free intraperitoneal gas. No abdominal wall hernia. Musculoskeletal: There are no acute displaced fractures. Disc space narrowing and endplate sclerosis at Q1/J9, likely sequela from previous infection. There is retroperitoneal fat stranding along the left iliac vessels, with asymmetric enlargement of the left iliacus and left gluteal musculature. Underlying infection cannot be excluded. No fluid collection or abscess. Reconstructed images demonstrate no additional findings. IMPRESSION: 1. Retroperitoneal fat stranding along the left iliac chain, with enlargement of the left iliacus and gluteus musculature. Findings are consistent with underlying inflammation or infection. No fluid collection or abscess at this time. 2. Disc space narrowing and endplate sclerosis at E1/D4, consistent with sequela of previous infection. 3.  Aortic Atherosclerosis (ICD10-I70.0). Electronically Signed   By: Randa Ngo M.D.   On: 04/10/2020 01:05   IR Fluoro Guide Ndl Plmt / BX  Result Date: 04/10/2020 INDICATION: 54 year old male with a history of bilateral hip replacements and prior lumbar osteomyelitis-discitis. He presents with left hip pain and complex left hip joint effusion concerning for septic arthritis. He presents for aspiration. EXAM: Left hip joint aspiration MEDICATIONS: The patient is currently admitted to the hospital and receiving intravenous antibiotics. The antibiotics were administered within an appropriate time frame prior to the initiation of the procedure. ANESTHESIA/SEDATION: None. COMPLICATIONS: None immediate. PROCEDURE: Informed written consent was obtained from the patient after a thorough discussion of the procedural risks, benefits and alternatives.  All questions were addressed. Maximal Sterile Barrier Technique was utilized including caps, mask, sterile gowns, sterile gloves, sterile drape, hand hygiene and skin antiseptic. A timeout was performed prior to the initiation of the procedure. Local anesthesia was attained by infiltration with 1% lidocaine. An 18 gauge trocar needle was carefully advanced through the soft tissues and into the joint space. Aspiration was then performed yielding approximately 60 mL of turbid yellow synovial fluid. Samples were sent for Gram stain, culture, cell count and crystals. IMPRESSION: Successful left hip joint aspiration yielding approximately 60 mL of turbid golden synovial fluid. Electronically Signed   By: Jacqulynn Cadet M.D.   On: 04/10/2020 16:24   DG Hip Unilat With Pelvis 2-3 Views Left  Result Date: 04/09/2020 CLINICAL DATA:  Left hip pain 1 week EXAM: DG HIP (WITH OR WITHOUT PELVIS) 2-3V LEFT COMPARISON:  None. FINDINGS: The patient is status post left total hip arthroplasty. No periprosthetic lucency or fracture is identified. There is also a right total hip arthroplasty. Mild sclerosis around both bilateral sacroiliac joints. IMPRESSION: Left total hip arthroplasty without complication. Electronically Signed   By: Ebony Cargo.D.  On: 04/09/2020 15:03   DG FEMUR 1V LEFT  Result Date: 04/09/2020 CLINICAL DATA:  Left hip pain EXAM: LEFT FEMUR 1 VIEW COMPARISON:  None. FINDINGS: Single view radiograph of the left femur demonstrates surgical changes of left total hip arthroplasty. Arthroplasty components overlie the expected position. Mild stress buttressing noted adjacent to the a distal aspect of the femoral stem component. No suspicious lytic or blastic bone lesions. No acute fracture or dislocation. Soft tissues are unremarkable. IMPRESSION: 1. Postsurgical changes of left total hip arthroplasty. No evidence of hardware complication. 2. No acute fracture or dislocation. Electronically Signed   By:  Fidela Salisbury MD   On: 04/09/2020 15:05        Scheduled Meds: . [MAR Hold] bupivacaine liposome  20 mL Infiltration Once  . chlorhexidine  15 mL Mouth/Throat Once   Or  . mouth rinse  15 mL Mouth Rinse Once  . [MAR Hold] escitalopram  20 mg Oral Daily  . [MAR Hold] mupirocin ointment  1 application Nasal BID  . [MAR Hold] rifampin  600 mg Oral Daily  . [MAR Hold] tranexamic acid (CYKLOKAPRON) topical - INTRAOP  2,000 mg Topical Once   Continuous Infusions: . [MAR Hold]  ceFAZolin (ANCEF) IV 2 g (04/11/20 0132)  . lactated ringers       LOS: 1 day     Cordelia Poche, MD [facility] 04/11/2020, 1:51 PM  If 7PM-7AM, please contact night-coverage [email or web address]

## 2020-04-11 NOTE — Op Note (Signed)
LEFT HIP IRRIGATION AND DEBRIDEMENT WITH HEAD AND POLY SWAP  Procedure Note MAX NUNO   [id number]  Pre-op Diagnosis: Left hip prosthetic infection.     Post-op Diagnosis: same   Operative Procedures  1.  Left total hip revision of both femoral and acetabular components  Personnel  Surgeon(s): Leandrew Koyanagi, MD   Anesthesia: spinal  Prosthesis:  Acetabulum: 54 mm gription cup with 10 degree lip poly liner Femur: AML with 36 mm +5 ceramic head  Date of Service: 04/11/2020   Indication: 54 y.o. year old male with a history of hip pain that has failed conservative management. After risk and benefits of a total hip arthroplasty were explained, the patient elected to proceed with a total hip arthroplasty after voicing understanding.  Procedure:  After informed consent was obtained and understanding of the risk were voiced including but not limited to bleeding, infection, damage to surrounding structures including nerves and vessels, blood clots, leg length inequality, dislocation and the failure to achieve desired results, the operative extremity was marked with verbal confirmation of the patient in the holding area.   The patient was then brought to the operating room and transported to the operating room table and placed in the lateral decubitus position. The operative limb was then prepped and draped in the usual sterile fashion and preoperative antibiotics were administered.  A time out was performed prior to the start of surgery confirming the correct extremity, as well as team members, implants and instruments available for the case. Correct surgical site was also confirmed with preoperative radiographs. A standard posterior approach to the hip was performed by incising the old surgical scar which was extended few centimeter distally.  Dissection was carried down through the subcutaneous tissue and scar onto the IT band which was then sharply incised in line with the incision.   The pseudocapsule and the capsule were both excised to expose the joint.  There was return of murky fluid under pressure.  This was cultured.  Deep tissue cultures were also taken from around the acetabulum and the proximal femoral component.  We then dislocated the hip without difficulty in the femoral head ball was gently tamped off without difficulty.  We continued our exposure by clearing the soft tissue around the acetabular component.  The acetabular polyethylene liner was extracted without difficulty.  6 L of normal saline was then thoroughly irrigated throughout the surgical wound.  I then put in a new acetabular polyethylene liner with a 10 degree lipped liner at the original position.  I also then placed a new 36 mm +5 ceramic head onto the femoral trunnion.  The joint was reduced and stability was achieved and 90 degrees of hip flexion and greater than 60 degrees of internal rotation.  There was no shuck to the implant.  We then put 2 g of vancomycin powder in the joint.  2 g of topical TXA was left deep in the hip joint.  The deep fascia was closed with #1 PDS, #0 PDS for the deep fat layer, and 2.0 Monocryl plus for the subcutaneous tissue. The skin was closed with staples.  A sterile dressing was applied. The patient was awakened and transported to the recovery room in stable condition. All sponge, needle, and instrument counts were correct at the end of the case.  Position: lateral decubitus   Complications: none.  Time Out: performed   Drains/Packing: none  Estimated blood loss: 150 cc  Returned to Recovery Room: in good  condition.   Antibiotics: yes   Mechanical VTE (DVT) Prophylaxis: sequential compression devices, TED thigh-high  Chemical VTE (DVT) Prophylaxis: lovenox  Sponge and Instrument Count Correct? yes   PACU: portable radiograph - low AP pelvis  Admission: inpatient status, start PT & OT POD#1  Plan/RTC: Return in 2 weeks for wound check.  Weight Bearing/Load  Lower Extremity: full  Posterior hip precautions  N. Eduard Roux, MD [facility] 4:01 PM

## 2020-04-11 NOTE — H&P (Signed)

## 2020-04-12 ENCOUNTER — Inpatient Hospital Stay (HOSPITAL_COMMUNITY): Payer: BC Managed Care – PPO

## 2020-04-12 DIAGNOSIS — R7881 Bacteremia: Secondary | ICD-10-CM

## 2020-04-12 LAB — BASIC METABOLIC PANEL
Anion gap: 9 (ref 5–15)
BUN: 20 mg/dL (ref 6–20)
CO2: 22 mmol/L (ref 22–32)
Calcium: 8.8 mg/dL — ABNORMAL LOW (ref 8.9–10.3)
Chloride: 104 mmol/L (ref 98–111)
Creatinine, Ser: 0.99 mg/dL (ref 0.61–1.24)
GFR calc Af Amer: >60 mL/min (ref >60)
GFR calc non Af Amer: >60 mL/min (ref >60)
Glucose, Bld: 135 mg/dL — ABNORMAL HIGH (ref 70–99)
Potassium: 4.7 mmol/L (ref 3.5–5.1)
Sodium: 135 mmol/L (ref 135–145)

## 2020-04-12 LAB — CULTURE, BLOOD (ROUTINE X 2): Special Requests: ADEQUATE

## 2020-04-12 LAB — URINE CULTURE: Culture: >=100000 — AB

## 2020-04-12 LAB — CBC
HCT: 36 % — ABNORMAL LOW (ref 39.0–52.0)
Hemoglobin: 11.3 g/dL — ABNORMAL LOW (ref 13.0–17.0)
MCH: 30.1 pg (ref 26.0–34.0)
MCHC: 31.4 g/dL (ref 30.0–36.0)
MCV: 95.7 fL (ref 80.0–100.0)
Platelets: 346 10*3/uL (ref 150–400)
RBC: 3.76 MIL/uL — ABNORMAL LOW (ref 4.22–5.81)
RDW: 13.5 % (ref 11.5–15.5)
WBC: 10.9 10*3/uL — ABNORMAL HIGH (ref 4.0–10.5)
nRBC: 0 % (ref 0.0–0.2)

## 2020-04-12 NOTE — Evaluation (Signed)
Physical Therapy Evaluation Patient Details Name: [name] MRN: [id number] DOB: 01/15/1966 Today's Date: 04/12/2020   History of Present Illness  54yo male with septic arthritis, received L THR I&D with prosthetic head and poly swap on 04/11/20. PMH B THRs, lumbar laminectomy and decompression  Clinical Impression   Patient received in bed, very pleasant and cooperative with PT. Having moderate pain throughout session and required extended time for all tasks today- needed ModA for bed mobility due to tendency to internally rotate and adduct L LE, so PT assist was critical to maintain posterior hip precautions. Able to transfer and gait train short distances at a min guard to MinA level with RW, but still needed cues to prevent hip IR due to tendency to internally rotate hip when turning and walking. Left up in recliner positioned to comfort with all needs met, pillow between LEs to prevent IR and ADD of L hip, and chair alarm active, nursing staff aware of patient status. May need SNF prior to safe return home, but will maximize PT and hopefully attempt to progress if able.     Follow Up Recommendations SNF;Supervision for mobility/OOB (but hopeful to progress to HHPT)    Equipment Recommendations  Rolling walker with 5" wheels;3in1 (PT)    Recommendations for Other Services       Precautions / Restrictions Precautions Precautions: Fall Precaution Comments: L posterior hip precautions Restrictions Weight Bearing Restrictions: Yes LLE Weight Bearing: Weight bearing as tolerated      Mobility  Bed Mobility Overal bed mobility: Needs Assistance Bed Mobility: Supine to Sit     Supine to sit: Mod assist     General bed mobility comments: ModA to manage L LE and prevent ADD past midline as well as to prevent hip IR  Transfers Overall transfer level: Needs assistance Equipment used: Rolling walker (2 wheeled) Transfers: Sit to/from Stand Sit to Stand: Min assist          General transfer comment: extended time and increased effort due to pain but able to slowly boost to full upright with minA  Ambulation/Gait Ambulation/Gait assistance: Min guard Gait Distance (Feet): 40 Feet (37f + 1110f Assistive device: Rolling walker (2 wheeled) Gait Pattern/deviations: Step-to pattern;Decreased step length - right;Decreased stance time - left;Decreased stride length;Decreased weight shift to left;Trunk flexed Gait velocity: decreased   General Gait Details: slow and steady with RW, good sequencing but did need Mod cues to prevent hip IR when turning  Stairs            Wheelchair Mobility    Modified Rankin (Stroke Patients Only)       Balance Overall balance assessment: Needs assistance Sitting-balance support: Bilateral upper extremity supported;Feet supported Sitting balance-Leahy Scale: Good     Standing balance support: Bilateral upper extremity supported;No upper extremity supported Standing balance-Leahy Scale: Poor Standing balance comment: reliant on BUE support                             Pertinent Vitals/Pain Pain Assessment: Faces Faces Pain Scale: Hurts even more Pain Location: genearlized L LE pain with mobility Pain Descriptors / Indicators: Aching;Squeezing;Tightness;Sharp Pain Intervention(s): Limited activity within patient's tolerance;Monitored during session    Home Living Family/patient expects to be discharged to:: Private residence Living Arrangements: Spouse/significant other;Children Available Help at Discharge: Family;Available 24 hours/day Type of Home: House Home Access: Stairs to enter Entrance Stairs-Rails: Can reach both Entrance Stairs-Number of Steps: 2-3 Home Layout: One  level Home Equipment: Walker - 2 wheels;Bedside commode;Adaptive equipment;Crutches      Prior Function           Comments: "I don't know- as long as I can walk on it I need no help"     Hand Dominance   Dominant  Hand: Right    Extremity/Trunk Assessment   Upper Extremity Assessment Upper Extremity Assessment: Defer to OT evaluation    Lower Extremity Assessment Lower Extremity Assessment: Generalized weakness    Cervical / Trunk Assessment Cervical / Trunk Assessment: Normal  Communication   Communication: No difficulties  Cognition Arousal/Alertness: Awake/alert Behavior During Therapy: WFL for tasks assessed/performed;Flat affect Overall Cognitive Status: Within Functional Limits for tasks assessed                                 General Comments: very flat but also very cooperative, benefits from increased procesing time      General Comments      Exercises     Assessment/Plan    PT Assessment Patient needs continued PT services  PT Problem List Decreased strength;Decreased knowledge of use of DME;Decreased activity tolerance;Decreased safety awareness;Decreased balance;Decreased knowledge of precautions;Pain;Decreased mobility;Decreased coordination       PT Treatment Interventions DME instruction;Balance training;Gait training;Stair training;Functional mobility training;Patient/family education;Therapeutic activities;Therapeutic exercise    PT Goals (Current goals can be found in the Care Plan section)  Acute Rehab PT Goals Patient Stated Goal: less pain, do what he needs to do to get well PT Goal Formulation: With patient Time For Goal Achievement: 04/26/20 Potential to Achieve Goals: Fair    Frequency 7X/week   Barriers to discharge        Co-evaluation               AM-PAC PT "6 Clicks" Mobility  Outcome Measure Help needed turning from your back to your side while in a flat bed without using bedrails?: A Little Help needed moving from lying on your back to sitting on the side of a flat bed without using bedrails?: A Lot Help needed moving to and from a bed to a chair (including a wheelchair)?: A Little Help needed standing up from a  chair using your arms (e.g., wheelchair or bedside chair)?: A Little Help needed to walk in hospital room?: A Little Help needed climbing 3-5 steps with a railing? : A Lot 6 Click Score: 16    End of Session Equipment Utilized During Treatment: Gait belt Activity Tolerance: Patient tolerated treatment well Patient left: in chair;with call bell/phone within reach;with chair alarm set Nurse Communication: Mobility status;Precautions;Weight bearing status PT Visit Diagnosis: Difficulty in walking, not elsewhere classified (R26.2);Pain;Muscle weakness (generalized) (M62.81);Unsteadiness on feet (R26.81) Pain - Right/Left: Left Pain - part of body: Leg    Time: 0922-1008 PT Time Calculation (min) (ACUTE ONLY): 46 min   Charges:   PT Evaluation $PT Eval Moderate Complexity: 1 Mod PT Treatments $Gait Training: 8-22 mins $Therapeutic Activity: 8-22 mins        Windell Norfolk, DPT, PN1   Supplemental Physical Therapist [place]    Pager [phone] Acute Rehab Office [phone]

## 2020-04-12 NOTE — Progress Notes (Signed)
PROGRESS NOTE    [name]  PZW:[id number] DOB: 1966-01-02 DOA: 04/09/2020 PCP: Lesleigh Noe, MD   Brief Narrative: [name] is a 54 y.o. male with medical history significant of lumbar epidural abscess, spinal stenosis status post decompression and laminectomy L3-4, L4-L5, and CT aspiration of right psoas abscess, MSSA bacteremia in December 2020. Patient presented secondary to left hip pain and found to have evidence of septic arthritis/infected prosthetic hip joint.   Assessment & Plan:   Principal Problem:   Infection of left prosthetic hip joint ([place]) Active Problems:   H/O MSSA epidural abscess, L2-L5   MSSA bacteremia   Acute low back pain   Infected prosthetic hip joint Septic arthritis Left sacral/iliac osteomyelitis Patient does not have sepsis related to these infections. Left hip joint was aspirated with fluid consistent with infected joint. Orthopedic surgery with plans for joint replacement. Patient started empirically on Vancomycin and Cefepime and transitioned to Cefazolin IV. Staphylococcus aureus growing on preliminary cultures without evidence of methicillin resistance on BCID -Infectious disease recommendations: Continue Cefazolin IV, Rifampin, Transthoracic Echocardiogram, Repeat blood cultures, repeat MRI eventually -Orthopedic surgery recommendations: WBAT with walker -Repeat blood cultures  AKI Baseline hemoglobin of 0.7 from 09/2019. Creatinine of 1.37 on admission. Creatinine back to baseline. Resolved. -Discontinue IV fluids  Hyponatremia Mild. Resolved with IV fluids.  Anxiety Depression -Continue Lexapro   DVT prophylaxis: SCDs Code Status:   Code Status: Full Code Family Communication: None at bedside Disposition Plan: Discharge home vs SNF pending orthopedic and ID recommendations for management of hip infection, osteomyelitis and bacteremia. Anticipate at least 3-5 days   Consultants:   Orthopedic surgery  Infectious  disease  Procedures:   LEFT HIP I&D/HIP ARTHROPLASTY (04/11/2020)  Antimicrobials:  Vancomycin  Cefepime  Cefazolin    Subjective: Hip pain is manageable. No other concerns.  Objective: Vitals:   04/11/20 2130 04/12/20 0141 04/12/20 0543 04/12/20 0651  BP: 115/76 115/66 121/69   Pulse: (!) 56 (!) 55 (!) 56 64  Resp: 18 18    Temp: 97.6 F (36.4 C) (!) 97.4 F (36.3 C) (!) 97.4 F (36.3 C)   TempSrc: Oral Oral Oral   SpO2: 97% 98% 99%   Weight:      Height:        Intake/Output Summary (Last 24 hours) at 04/12/2020 1135 Last data filed at 04/12/2020 0710 Gross per 24 hour  Intake 2870 ml  Output 2050 ml  Net 820 ml   Filed Weights   04/09/20 1402 04/11/20 0112  Weight: 104.3 kg 105.2 kg    Examination:  General exam: Appears calm and comfortable Respiratory system: Clear to auscultation. Respiratory effort normal. Cardiovascular system: S1 & S2 heard, RRR. No murmurs, rubs, gallops or clicks. Gastrointestinal system: Abdomen is nondistended, soft and nontender. No organomegaly or masses felt. Normal bowel sounds heard. Central nervous system: Alert and oriented. No focal neurological deficits. Musculoskeletal: No edema. No calf tenderness Skin: No cyanosis. No rashes Psychiatry: Judgement and insight appear normal. Mood & affect appropriate.     Data Reviewed: I have personally reviewed following labs and imaging studies  CBC Lab Results  Component Value Date   WBC 10.9 (H) 04/12/2020   RBC 3.76 (L) 04/12/2020   HGB 11.3 (L) 04/12/2020   HCT 36.0 (L) 04/12/2020   MCV 95.7 04/12/2020   MCH 30.1 04/12/2020   PLT 346 04/12/2020   MCHC 31.4 04/12/2020   RDW 13.5 04/12/2020   LYMPHSABS 0.9 04/10/2020  MONOABS 0.9 04/10/2020   EOSABS 0.0 04/10/2020   BASOSABS 0.0 07/28/7251     Last metabolic panel Lab Results  Component Value Date   NA 135 04/12/2020   K 4.7 04/12/2020   CL 104 04/12/2020   CO2 22 04/12/2020   BUN 20 04/12/2020    CREATININE 0.99 04/12/2020   GLUCOSE 135 (H) 04/12/2020   GFRNONAA >60 04/12/2020   GFRAA >60 04/12/2020   CALCIUM 8.8 (L) 04/12/2020   PHOS 4.6 08/27/2019   PROT 7.0 04/11/2020   ALBUMIN 2.2 (L) 04/11/2020   BILITOT 1.0 04/11/2020   ALKPHOS 156 (H) 04/11/2020   AST 48 (H) 04/11/2020   ALT 51 (H) 04/11/2020   ANIONGAP 9 04/12/2020    CBG (last 3)  No results for input(s): GLUCAP in the last 72 hours.   GFR: Estimated Creatinine Clearance: 106.9 mL/min (by C-G formula based on SCr of 0.99 mg/dL).  Coagulation Profile: No results for input(s): INR, PROTIME in the last 168 hours.  Recent Results (from the past 240 hour(s))  Culture, blood (routine x 2)     Status: Abnormal   Collection Time: 04/09/20 10:35 PM   Specimen: BLOOD  Result Value Ref Range Status   Specimen Description   Final    BLOOD LEFT ANTECUBITAL Performed at [facility] [address], [place], [place] [address]    Special Requests   Final    BOTTLES DRAWN AEROBIC AND ANAEROBIC Blood Culture adequate volume Performed at [facility] [address] [place], [place] [address]    Culture  Setup Time   Final    GRAM POSITIVE COCCI IN CLUSTERS IN BOTH AEROBIC AND ANAEROBIC BOTTLES CRITICAL RESULT CALLED TO, READ BACK BY AND VERIFIED WITH: Ellin Mayhew [facility] 04/10/20 AT 1650 SK Performed at [facility], [place] [address], [place], [place] [address]    Culture STAPHYLOCOCCUS AUREUS (A)  Final   Report Status 04/12/2020 FINAL  Final   Organism ID, Bacteria STAPHYLOCOCCUS AUREUS  Final      Susceptibility   Staphylococcus aureus - MIC*    CIPROFLOXACIN >=8 RESISTANT Resistant     ERYTHROMYCIN >=8 RESISTANT Resistant     GENTAMICIN <=0.5 SENSITIVE Sensitive     OXACILLIN 0.5 SENSITIVE Sensitive     TETRACYCLINE <=1 SENSITIVE Sensitive     VANCOMYCIN 1 SENSITIVE Sensitive     TRIMETH/SULFA <=10 SENSITIVE Sensitive     CLINDAMYCIN <=0.25 SENSITIVE Sensitive      RIFAMPIN <=0.5 SENSITIVE Sensitive     Inducible Clindamycin NEGATIVE Sensitive     * STAPHYLOCOCCUS AUREUS  Blood Culture ID Panel (Reflexed)     Status: Abnormal   Collection Time: 04/09/20 10:35 PM  Result Value Ref Range Status   Enterococcus faecalis NOT DETECTED NOT DETECTED Final   Enterococcus Faecium NOT DETECTED NOT DETECTED Final   Listeria monocytogenes NOT DETECTED NOT DETECTED Final   Staphylococcus species DETECTED (A) NOT DETECTED Final    Comment: CRITICAL RESULT CALLED TO, READ BACK BY AND VERIFIED WITH: PHARMD M MACCIA 04/10/20 AT 1650 SK    Staphylococcus aureus (BCID) DETECTED (A) NOT DETECTED Final    Comment: CRITICAL RESULT CALLED TO, READ BACK BY AND VERIFIED WITH: PHARMD M MACCIA 04/10/20 AT 1650 SK    Staphylococcus epidermidis NOT DETECTED NOT DETECTED Final   Staphylococcus lugdunensis NOT DETECTED NOT DETECTED Final   Streptococcus species NOT DETECTED NOT DETECTED Final   Streptococcus agalactiae NOT DETECTED NOT DETECTED Final   Streptococcus  pneumoniae NOT DETECTED NOT DETECTED Final   Streptococcus pyogenes NOT DETECTED NOT DETECTED Final   A.calcoaceticus-baumannii NOT DETECTED NOT DETECTED Final   Bacteroides fragilis NOT DETECTED NOT DETECTED Final   Enterobacterales NOT DETECTED NOT DETECTED Final   Enterobacter cloacae complex NOT DETECTED NOT DETECTED Final   Escherichia coli NOT DETECTED NOT DETECTED Final   Klebsiella aerogenes NOT DETECTED NOT DETECTED Final   Klebsiella oxytoca NOT DETECTED NOT DETECTED Final   Klebsiella pneumoniae NOT DETECTED NOT DETECTED Final   Proteus species NOT DETECTED NOT DETECTED Final   Salmonella species NOT DETECTED NOT DETECTED Final   Serratia marcescens NOT DETECTED NOT DETECTED Final   Haemophilus influenzae NOT DETECTED NOT DETECTED Final   Neisseria meningitidis NOT DETECTED NOT DETECTED Final   Pseudomonas aeruginosa NOT DETECTED NOT DETECTED Final   Stenotrophomonas maltophilia NOT DETECTED NOT  DETECTED Final   Candida albicans NOT DETECTED NOT DETECTED Final   Candida auris NOT DETECTED NOT DETECTED Final   Candida glabrata NOT DETECTED NOT DETECTED Final   Candida krusei NOT DETECTED NOT DETECTED Final   Candida parapsilosis NOT DETECTED NOT DETECTED Final   Candida tropicalis NOT DETECTED NOT DETECTED Final   Cryptococcus neoformans/gattii NOT DETECTED NOT DETECTED Final   Meth resistant mecA/C and MREJ NOT DETECTED NOT DETECTED Final    Comment: Performed at [facility], [address], [place], [place] [address]  SARS Coronavirus 2 by RT PCR (hospital order, performed in [facility] hospital lab) Nasopharyngeal Nasopharyngeal Swab     Status: None   Collection Time: 04/10/20  1:42 AM   Specimen: Nasopharyngeal Swab  Result Value Ref Range Status   SARS Coronavirus 2 NEGATIVE NEGATIVE Final    Comment: (NOTE) SARS-CoV-2 target nucleic acids are NOT DETECTED.  The SARS-CoV-2 RNA is generally detectable in upper and lower respiratory specimens during the acute phase of infection. The lowest concentration of SARS-CoV-2 viral copies this assay can detect is 250 copies / mL. A negative result does not preclude SARS-CoV-2 infection and should not be used as the sole basis for treatment or other patient management decisions.  A negative result may occur with improper specimen collection / handling, submission of specimen other than nasopharyngeal swab, presence of viral mutation(s) within the areas targeted by this assay, and inadequate number of viral copies (<250 copies / mL). A negative result must be combined with clinical observations, patient history, and epidemiological information.  Fact Sheet for Patients:   StrictlyIdeas.no  Fact Sheet for Healthcare Providers: BankingDealers.co.za  This test is not yet approved or  cleared by the [place] FDA and has been authorized for detection and/or diagnosis of  SARS-CoV-2 by FDA under an Emergency Use Authorization (EUA).  This EUA will remain in effect (meaning this test can be used) for the duration of the COVID-19 declaration under Section 564(b)(1) of the Act, 21 U.S.C. section 360bbb-3(b)(1), unless the authorization is terminated or revoked sooner.  Performed at [facility], [place] [address] [place], [place] [address]   Urine culture     Status: Abnormal   Collection Time: 04/10/20  3:00 AM   Specimen: Urine, Clean Catch  Result Value Ref Range Status   Specimen Description   Final    URINE, CLEAN CATCH Performed at [facility], [place] [address], [place], [place] [address]    Special Requests   Final    NONE Performed at [facility], [place] [address] [place], [place] [address]  Culture >=100,000 COLONIES/mL STAPHYLOCOCCUS AUREUS (A)  Final   Report Status 04/12/2020 FINAL  Final   Organism ID, Bacteria STAPHYLOCOCCUS AUREUS (A)  Final      Susceptibility   Staphylococcus aureus - MIC*    CIPROFLOXACIN >=8 RESISTANT Resistant     GENTAMICIN <=0.5 SENSITIVE Sensitive     NITROFURANTOIN <=16 SENSITIVE Sensitive     OXACILLIN 0.5 SENSITIVE Sensitive     TETRACYCLINE <=1 SENSITIVE Sensitive     VANCOMYCIN <=0.5 SENSITIVE Sensitive     TRIMETH/SULFA <=10 SENSITIVE Sensitive     CLINDAMYCIN <=0.25 SENSITIVE Sensitive     RIFAMPIN <=0.5 SENSITIVE Sensitive     Inducible Clindamycin NEGATIVE Sensitive     * >=100,000 COLONIES/mL STAPHYLOCOCCUS AUREUS  Body fluid culture     Status: None (Preliminary result)   Collection Time: 04/10/20  4:05 PM   Specimen: Joint, Left Hip; Synovial Fluid  Result Value Ref Range Status   Specimen Description SYNOVIAL LEFT HIP  Final   Special Requests NONE  Final   Gram Stain   Final    ABUNDANT WBC PRESENT,BOTH PMN AND MONONUCLEAR NO ORGANISMS SEEN    Culture   Final    NO GROWTH 2 DAYS Performed at [facility], [address] [place], [place] [address]    Report Status PENDING  Incomplete  Surgical PCR screen     Status: Abnormal   Collection Time: 04/10/20 11:00 PM   Specimen: Nasal Mucosa; Nasal Swab  Result Value Ref Range Status   MRSA, PCR POSITIVE (A) NEGATIVE Final    Comment: RESULT CALLED TO, READ BACK BY AND VERIFIED WITH: Clydene Laming RN 04/11/20 0123 JDW    Staphylococcus aureus POSITIVE (A) NEGATIVE Final    Comment: (NOTE) The Xpert SA Assay (FDA approved for NASAL specimens in patients 45 years of age and older), is one component of a comprehensive surveillance program. It is not intended to diagnose infection nor to guide or monitor treatment. Performed at [facility], [place] [address], [place], [place] [address]   Aerobic/Anaerobic Culture (surgical/deep wound)     Status: None (Preliminary result)   Collection Time: 04/11/20  3:09 PM   Specimen: PATH Other; Body Fluid  Result Value Ref Range Status   Specimen Description FLUID LEFT HIP  Final   Special Requests SWAB NO 1 PT ON ANCEF  Final   Gram Stain   Final    RARE WBC PRESENT,BOTH PMN AND MONONUCLEAR NO ORGANISMS SEEN    Culture   Final    NO GROWTH < 24 HOURS Performed at [facility], [place] [address] [place], [place] [address]    Report Status PENDING  Incomplete  Aerobic/Anaerobic Culture (surgical/deep wound)     Status: None (Preliminary result)   Collection Time: 04/11/20  3:11 PM   Specimen: PATH Other; Body Fluid  Result Value Ref Range Status   Specimen Description FLUID LEFT HIP  Final   Special Requests SWAB NO 2 PT ON ANCEF  Final   Gram Stain   Final    RARE WBC PRESENT,BOTH PMN AND MONONUCLEAR NO ORGANISMS SEEN    Culture   Final    NO GROWTH < 24 HOURS Performed at [facility], [place] [address] [place], [place] [address]    Report Status PENDING  Incomplete  Aerobic/Anaerobic Culture (surgical/deep wound)     Status: None (Preliminary result)   Collection Time: 04/11/20  3:24 PM    Specimen: PATH  Other; Tissue  Result Value Ref Range Status   Specimen Description TISSUE LEFT HIP  Final   Special Requests FROM HIP JOINT,PT ON ANCEF  Final   Gram Stain   Final    FEW WBC PRESENT, PREDOMINANTLY MONONUCLEAR NO ORGANISMS SEEN    Culture   Final    NO GROWTH < 24 HOURS Performed at [facility], [place] [address], [place], [place] [address]    Report Status PENDING  Incomplete        Radiology Studies: DG Pelvis Portable  Result Date: 04/11/2020 CLINICAL DATA:  Status post hip replacement. EXAM: PORTABLE PELVIS 1-2 VIEWS COMPARISON:  Pelvis CT, dated August 20, 2019 FINDINGS: Bilateral total hip replacements are seen. There is no evidence of surrounding lucency to suggest the presence of hardware loosening or infection. There is no evidence of pelvic fracture or diastasis. No pelvic bone lesions are seen. IMPRESSION: Bilateral total hip replacements without evidence of hardware loosening or infection. Electronically Signed   By: Virgina Norfolk M.D.   On: 04/11/2020 18:59   IR Fluoro Guide Ndl Plmt / BX  Result Date: 04/10/2020 INDICATION: 54 year old male with a history of bilateral hip replacements and prior lumbar osteomyelitis-discitis. He presents with left hip pain and complex left hip joint effusion concerning for septic arthritis. He presents for aspiration. EXAM: Left hip joint aspiration MEDICATIONS: The patient is currently admitted to the hospital and receiving intravenous antibiotics. The antibiotics were administered within an appropriate time frame prior to the initiation of the procedure. ANESTHESIA/SEDATION: None. COMPLICATIONS: None immediate. PROCEDURE: Informed written consent was obtained from the patient after a thorough discussion of the procedural risks, benefits and alternatives. All questions were addressed. Maximal Sterile Barrier Technique was utilized including caps, mask, sterile gowns, sterile gloves, sterile drape, hand hygiene and skin  antiseptic. A timeout was performed prior to the initiation of the procedure. Local anesthesia was attained by infiltration with 1% lidocaine. An 18 gauge trocar needle was carefully advanced through the soft tissues and into the joint space. Aspiration was then performed yielding approximately 60 mL of turbid yellow synovial fluid. Samples were sent for Gram stain, culture, cell count and crystals. IMPRESSION: Successful left hip joint aspiration yielding approximately 60 mL of turbid golden synovial fluid. Electronically Signed   By: Jacqulynn Cadet M.D.   On: 04/10/2020 16:24        Scheduled Meds:  acetaminophen  1,000 mg Oral Q6H   docusate sodium  100 mg Oral BID   enoxaparin (LOVENOX) injection  40 mg Subcutaneous Q24H   escitalopram  20 mg Oral Daily   multivitamin with minerals  1 tablet Oral Daily   mupirocin ointment  1 application Nasal BID   oxyCODONE  10 mg Oral Q12H   Ensure Max Protein  11 oz Oral BID   rifampin  600 mg Oral Daily   Continuous Infusions:  sodium chloride 75 mL/hr at 04/11/20 1756    ceFAZolin (ANCEF) IV 2 g (04/12/20 0918)     LOS: 2 days     Cordelia Poche, MD [facility] 04/12/2020, 11:35 AM  If 7PM-7AM, please contact night-coverage [email or web address]

## 2020-04-12 NOTE — Anesthesia Postprocedure Evaluation (Signed)
Anesthesia Post Note  Patient: [name]  Procedure(s) Performed: LEFT HIP IRRIGATION AND DEBRIDEMENT WITH HEAD AND POLY SWAP (Left Hip)     Patient location during evaluation: Other Anesthesia Type: General Level of consciousness: awake and alert Pain management: pain level controlled Vital Signs Assessment: post-procedure vital signs reviewed and stable Respiratory status: spontaneous breathing, nonlabored ventilation and respiratory function stable Cardiovascular status: blood pressure returned to baseline and stable Postop Assessment: no apparent nausea or vomiting Anesthetic complications: no   No complications documented.  Last Vitals:  Vitals:   04/12/20 0543 04/12/20 0651  BP: 121/69   Pulse: (!) 56 64  Resp:    Temp: (!) 36.3 C   SpO2: 99%     Last Pain:  Vitals:   04/12/20 0543  TempSrc: Oral  PainSc:                  Laramie Meissner,W. EDMOND

## 2020-04-12 NOTE — Progress Notes (Signed)
  Subjective: [name] is a 54 y.o. male s/p left hip I&D with head/poly swap.  They are POD1.  Pt's pain is controlled overall. Complains of significant back pain as well as hip pain.  Denies fever, chills, malaise but does note night sweats.    Objective: Vital signs in last 24 hours: Temp:  [97.4 F (36.3 C)-99.2 F (37.3 C)] 97.4 F (36.3 C) (09/18 0543) Pulse Rate:  [55-86] 64 (09/18 0651) Resp:  [10-20] 18 (09/18 0141) BP: (106-127)/(65-80) 121/69 (09/18 0543) SpO2:  [94 %-99 %] 99 % (09/18 0543)  Intake/Output from previous day: 09/17 0701 - 09/18 0700 In: 2630 [P.O.:700; I.V.:1500; IV Piggyback:430] Out: 2050 [Urine:1850; Blood:200] Intake/Output this shift: No intake/output data recorded.  Exam:  No gross blood or drainage overlying the dressing Left foot warm and well-perfused Sensation intact distally in the left foot Able to dorsiflex and plantarflex the left foot   Labs: Recent Labs    04/09/20 2235 04/10/20 1420 04/11/20 0351 04/12/20 0348  HGB 14.1 13.0 11.5* 11.3*   Recent Labs    04/11/20 0351 04/12/20 0348  WBC 10.1 10.9*  RBC 3.64* 3.76*  HCT 35.3* 36.0*  PLT 319 346   Recent Labs    04/11/20 0351 04/12/20 0348  NA 132* 135  K 4.2 4.7  CL 102 104  CO2 22 22  BUN 25* 20  CREATININE 1.36* 0.99  GLUCOSE 104* 135*  CALCIUM 8.6* 8.8*   No results for input(s): LABPT, INR in the last 72 hours.  Assessment/Plan: Pt is POD1 s/p left hip I&D and head/poly swap.    -Disposition pending  -Continue with PT  -Continue with ID recommendations  -WBAT with a walker. Posterior hip precautions    Gerrianne Scale Aysia Lowder 04/12/2020, 8:51 AM

## 2020-04-12 NOTE — Progress Notes (Signed)
  Echocardiogram 2D Echocardiogram has been performed.  [name] 04/12/2020, 5:54 PM

## 2020-04-13 LAB — ECHOCARDIOGRAM COMPLETE
AR max vel: 3.27 cm2
AV Area VTI: 3.01 cm2
AV Area mean vel: 3.04 cm2
AV Mean grad: 3 mmHg
AV Peak grad: 5.9 mmHg
Ao pk vel: 1.21 m/s
Area-P 1/2: 2.2 cm2
Height: 72 in
S' Lateral: 3.4 cm
Weight: 3712 oz

## 2020-04-13 NOTE — Progress Notes (Addendum)
PROGRESS NOTE    [name]  IRJ:[id number] DOB: 12/10/1965 DOA: 04/09/2020 PCP: Lesleigh Noe, MD   Brief Narrative: CHAISE [name] is a 54 y.o. male with medical history significant of lumbar epidural abscess, spinal stenosis status post decompression and laminectomy L3-4, L4-L5, and CT aspiration of right psoas abscess, MSSA bacteremia in December 2020. Patient presented secondary to left hip pain and found to have evidence of septic arthritis/infected prosthetic hip joint.   Assessment & Plan:   Principal Problem:   Infection of left prosthetic hip joint ([name]) Active Problems:   H/O MSSA epidural abscess, L2-L5   MSSA bacteremia   Acute low back pain   Infected prosthetic hip joint Septic arthritis Left sacral/iliac osteomyelitis Patient does not have sepsis related to these infections. Left hip joint was aspirated with fluid consistent with infected joint. Orthopedic surgery with plans for joint replacement. Patient started empirically on Vancomycin and Cefepime and transitioned to Cefazolin IV. Staphylococcus aureus growing on preliminary cultures without evidence of methicillin resistance on BCID -Infectious disease recommendations: Continue Cefazolin IV, Rifampin, Transthoracic Echocardiogram (report pending), repeat MRI eventually -Orthopedic surgery recommendations: WBAT with walker -Repeat blood cultures pending  AKI Baseline hemoglobin of 0.7 from 09/2019. Creatinine of 1.37 on admission. Creatinine back to baseline. Resolved.  Hyponatremia Mild. Resolved with IV fluids.  Anxiety Depression Per patient, he is no longer taking Lexapro -Discontinue Lexapro   DVT prophylaxis: SCDs Code Status:   Code Status: Full Code Family Communication: None at bedside Disposition Plan: Discharge home vs SNF pending orthopedic and ID recommendations for management of hip infection, osteomyelitis and bacteremia. Anticipate at least 3-5 days   Consultants:   Orthopedic  surgery  Infectious disease  Procedures:   LEFT HIP I&D/HIP ARTHROPLASTY (04/11/2020)  Antimicrobials:  Vancomycin  Cefepime  Cefazolin    Subjective: Left hip pain. No other concerns.  Objective: Vitals:   04/12/20 0651 04/12/20 1559 04/12/20 2013 04/13/20 0431  BP:  (!) 107/52 110/65 109/63  Pulse: 64 64 65 64  Resp:  18 18 18   Temp:  97.8 F (36.6 C) 97.9 F (36.6 C) 97.8 F (36.6 C)  TempSrc:  Oral Oral Oral  SpO2:  100% 100% 100%  Weight:      Height:        Intake/Output Summary (Last 24 hours) at 04/13/2020 1131 Last data filed at 04/13/2020 0120 Gross per 24 hour  Intake 840 ml  Output 1850 ml  Net -1010 ml   Filed Weights   04/09/20 1402 04/11/20 0112  Weight: 104.3 kg 105.2 kg    Examination:  General exam: Appears calm and comfortable Respiratory system: Clear to auscultation. Respiratory effort normal. Cardiovascular system: S1 & S2 heard, RRR. No murmurs, rubs, gallops or clicks. Gastrointestinal system: Abdomen is nondistended, soft and nontender. No organomegaly or masses felt. Normal bowel sounds heard. Central nervous system: Alert and oriented. No focal neurological deficits. Musculoskeletal: No edema. No calf tenderness Skin: No cyanosis. No rashes Psychiatry: Judgement and insight appear normal. Mood & affect appropriate.    Data Reviewed: I have personally reviewed following labs and imaging studies  CBC Lab Results  Component Value Date   WBC 10.9 (H) 04/12/2020   RBC 3.76 (L) 04/12/2020   HGB 11.3 (L) 04/12/2020   HCT 36.0 (L) 04/12/2020   MCV 95.7 04/12/2020   MCH 30.1 04/12/2020   PLT 346 04/12/2020   MCHC 31.4 04/12/2020   RDW 13.5 04/12/2020   LYMPHSABS 0.9 04/10/2020   MONOABS  0.9 04/10/2020   EOSABS 0.0 04/10/2020   BASOSABS 0.0 93/57/0177     Last metabolic panel Lab Results  Component Value Date   NA 135 04/12/2020   K 4.7 04/12/2020   CL 104 04/12/2020   CO2 22 04/12/2020   BUN 20 04/12/2020    CREATININE 0.99 04/12/2020   GLUCOSE 135 (H) 04/12/2020   GFRNONAA >60 04/12/2020   GFRAA >60 04/12/2020   CALCIUM 8.8 (L) 04/12/2020   PHOS 4.6 08/27/2019   PROT 7.0 04/11/2020   ALBUMIN 2.2 (L) 04/11/2020   BILITOT 1.0 04/11/2020   ALKPHOS 156 (H) 04/11/2020   AST 48 (H) 04/11/2020   ALT 51 (H) 04/11/2020   ANIONGAP 9 04/12/2020    CBG (last 3)  No results for input(s): GLUCAP in the last 72 hours.   GFR: Estimated Creatinine Clearance: 106.9 mL/min (by C-G formula based on SCr of 0.99 mg/dL).  Coagulation Profile: No results for input(s): INR, PROTIME in the last 168 hours.  Recent Results (from the past 240 hour(s))  Culture, blood (routine x 2)     Status: Abnormal   Collection Time: 04/09/20 10:35 PM   Specimen: BLOOD  Result Value Ref Range Status   Specimen Description   Final    BLOOD LEFT ANTECUBITAL Performed at [facility] [address], [place], [place] [address]    Special Requests   Final    BOTTLES DRAWN AEROBIC AND ANAEROBIC Blood Culture adequate volume Performed at [facility] [address] [place], [place] [address]    Culture  Setup Time   Final    GRAM POSITIVE COCCI IN CLUSTERS IN BOTH AEROBIC AND ANAEROBIC BOTTLES CRITICAL RESULT CALLED TO, READ BACK BY AND VERIFIED WITH: Ellin Mayhew [facility] 04/10/20 AT 1650 SK Performed at [facility], [place] [address], [place], [place] [address]    Culture STAPHYLOCOCCUS AUREUS (A)  Final   Report Status 04/12/2020 FINAL  Final   Organism ID, Bacteria STAPHYLOCOCCUS AUREUS  Final      Susceptibility   Staphylococcus aureus - MIC*    CIPROFLOXACIN >=8 RESISTANT Resistant     ERYTHROMYCIN >=8 RESISTANT Resistant     GENTAMICIN <=0.5 SENSITIVE Sensitive     OXACILLIN 0.5 SENSITIVE Sensitive     TETRACYCLINE <=1 SENSITIVE Sensitive     VANCOMYCIN 1 SENSITIVE Sensitive     TRIMETH/SULFA <=10 SENSITIVE Sensitive     CLINDAMYCIN <=0.25 SENSITIVE Sensitive      RIFAMPIN <=0.5 SENSITIVE Sensitive     Inducible Clindamycin NEGATIVE Sensitive     * STAPHYLOCOCCUS AUREUS  Blood Culture ID Panel (Reflexed)     Status: Abnormal   Collection Time: 04/09/20 10:35 PM  Result Value Ref Range Status   Enterococcus faecalis NOT DETECTED NOT DETECTED Final   Enterococcus Faecium NOT DETECTED NOT DETECTED Final   Listeria monocytogenes NOT DETECTED NOT DETECTED Final   Staphylococcus species DETECTED (A) NOT DETECTED Final    Comment: CRITICAL RESULT CALLED TO, READ BACK BY AND VERIFIED WITH: PHARMD M MACCIA 04/10/20 AT 1650 SK    Staphylococcus aureus (BCID) DETECTED (A) NOT DETECTED Final    Comment: CRITICAL RESULT CALLED TO, READ BACK BY AND VERIFIED WITH: PHARMD M MACCIA 04/10/20 AT 1650 SK    Staphylococcus epidermidis NOT DETECTED NOT DETECTED Final   Staphylococcus lugdunensis NOT DETECTED NOT DETECTED Final   Streptococcus species NOT DETECTED NOT DETECTED Final   Streptococcus agalactiae NOT DETECTED NOT DETECTED Final   Streptococcus pneumoniae  NOT DETECTED NOT DETECTED Final   Streptococcus pyogenes NOT DETECTED NOT DETECTED Final   A.calcoaceticus-baumannii NOT DETECTED NOT DETECTED Final   Bacteroides fragilis NOT DETECTED NOT DETECTED Final   Enterobacterales NOT DETECTED NOT DETECTED Final   Enterobacter cloacae complex NOT DETECTED NOT DETECTED Final   Escherichia coli NOT DETECTED NOT DETECTED Final   Klebsiella aerogenes NOT DETECTED NOT DETECTED Final   Klebsiella oxytoca NOT DETECTED NOT DETECTED Final   Klebsiella pneumoniae NOT DETECTED NOT DETECTED Final   Proteus species NOT DETECTED NOT DETECTED Final   Salmonella species NOT DETECTED NOT DETECTED Final   Serratia marcescens NOT DETECTED NOT DETECTED Final   Haemophilus influenzae NOT DETECTED NOT DETECTED Final   Neisseria meningitidis NOT DETECTED NOT DETECTED Final   Pseudomonas aeruginosa NOT DETECTED NOT DETECTED Final   Stenotrophomonas maltophilia NOT DETECTED NOT  DETECTED Final   Candida albicans NOT DETECTED NOT DETECTED Final   Candida auris NOT DETECTED NOT DETECTED Final   Candida glabrata NOT DETECTED NOT DETECTED Final   Candida krusei NOT DETECTED NOT DETECTED Final   Candida parapsilosis NOT DETECTED NOT DETECTED Final   Candida tropicalis NOT DETECTED NOT DETECTED Final   Cryptococcus neoformans/gattii NOT DETECTED NOT DETECTED Final   Meth resistant mecA/C and MREJ NOT DETECTED NOT DETECTED Final    Comment: Performed at [facility], [address], [place], [place] [address]  SARS Coronavirus 2 by RT PCR (hospital order, performed in [facility] hospital lab) Nasopharyngeal Nasopharyngeal Swab     Status: None   Collection Time: 04/10/20  1:42 AM   Specimen: Nasopharyngeal Swab  Result Value Ref Range Status   SARS Coronavirus 2 NEGATIVE NEGATIVE Final    Comment: (NOTE) SARS-CoV-2 target nucleic acids are NOT DETECTED.  The SARS-CoV-2 RNA is generally detectable in upper and lower respiratory specimens during the acute phase of infection. The lowest concentration of SARS-CoV-2 viral copies this assay can detect is 250 copies / mL. A negative result does not preclude SARS-CoV-2 infection and should not be used as the sole basis for treatment or other patient management decisions.  A negative result may occur with improper specimen collection / handling, submission of specimen other than nasopharyngeal swab, presence of viral mutation(s) within the areas targeted by this assay, and inadequate number of viral copies (<250 copies / mL). A negative result must be combined with clinical observations, patient history, and epidemiological information.  Fact Sheet for Patients:   StrictlyIdeas.no  Fact Sheet for Healthcare Providers: BankingDealers.co.za  This test is not yet approved or  cleared by the [place] FDA and has been authorized for detection and/or diagnosis of  SARS-CoV-2 by FDA under an Emergency Use Authorization (EUA).  This EUA will remain in effect (meaning this test can be used) for the duration of the COVID-19 declaration under Section 564(b)(1) of the Act, 21 U.S.C. section 360bbb-3(b)(1), unless the authorization is terminated or revoked sooner.  Performed at [facility], [place] [address], [place], [place] [address]   Urine culture     Status: Abnormal   Collection Time: 04/10/20  3:00 AM   Specimen: Urine, Clean Catch  Result Value Ref Range Status   Specimen Description   Final    URINE, CLEAN CATCH Performed at [facility], [place] [address], [place], [place] [address]    Special Requests   Final    NONE Performed at [facility], [place] [address], [place], [place] [address]  Culture >=100,000 COLONIES/mL STAPHYLOCOCCUS AUREUS (A)  Final   Report Status 04/12/2020 FINAL  Final   Organism ID, Bacteria STAPHYLOCOCCUS AUREUS (A)  Final      Susceptibility   Staphylococcus aureus - MIC*    CIPROFLOXACIN >=8 RESISTANT Resistant     GENTAMICIN <=0.5 SENSITIVE Sensitive     NITROFURANTOIN <=16 SENSITIVE Sensitive     OXACILLIN 0.5 SENSITIVE Sensitive     TETRACYCLINE <=1 SENSITIVE Sensitive     VANCOMYCIN <=0.5 SENSITIVE Sensitive     TRIMETH/SULFA <=10 SENSITIVE Sensitive     CLINDAMYCIN <=0.25 SENSITIVE Sensitive     RIFAMPIN <=0.5 SENSITIVE Sensitive     Inducible Clindamycin NEGATIVE Sensitive     * >=100,000 COLONIES/mL STAPHYLOCOCCUS AUREUS  Body fluid culture     Status: None (Preliminary result)   Collection Time: 04/10/20  4:05 PM   Specimen: Joint, Left Hip; Synovial Fluid  Result Value Ref Range Status   Specimen Description SYNOVIAL LEFT HIP  Final   Special Requests NONE  Final   Gram Stain   Final    ABUNDANT WBC PRESENT,BOTH PMN AND MONONUCLEAR NO ORGANISMS SEEN    Culture   Final    NO GROWTH 3 DAYS Performed at [facility], [address], [place], [place] [address]    Report Status PENDING  Incomplete  Surgical PCR screen     Status: Abnormal   Collection Time: 04/10/20 11:00 PM   Specimen: Nasal Mucosa; Nasal Swab  Result Value Ref Range Status   MRSA, PCR POSITIVE (A) NEGATIVE Final    Comment: RESULT CALLED TO, READ BACK BY AND VERIFIED WITH: Clydene Laming RN 04/11/20 0123 JDW    Staphylococcus aureus POSITIVE (A) NEGATIVE Final    Comment: (NOTE) The Xpert SA Assay (FDA approved for NASAL specimens in patients 10 years of age and older), is one component of a comprehensive surveillance program. It is not intended to diagnose infection nor to guide or monitor treatment. Performed at [facility], [place] [address], [place], [place] [address]   Aerobic/Anaerobic Culture (surgical/deep wound)     Status: None (Preliminary result)   Collection Time: 04/11/20  3:09 PM   Specimen: PATH Other; Body Fluid  Result Value Ref Range Status   Specimen Description FLUID LEFT HIP  Final   Special Requests SWAB NO 1 PT ON ANCEF  Final   Gram Stain   Final    RARE WBC PRESENT,BOTH PMN AND MONONUCLEAR NO ORGANISMS SEEN    Culture   Final    NO GROWTH 2 DAYS NO ANAEROBES ISOLATED; CULTURE IN PROGRESS FOR 5 DAYS Performed at [facility], [address] [place], [place] [address]    Report Status PENDING  Incomplete  Aerobic/Anaerobic Culture (surgical/deep wound)     Status: None (Preliminary result)   Collection Time: 04/11/20  3:11 PM   Specimen: PATH Other; Body Fluid  Result Value Ref Range Status   Specimen Description FLUID LEFT HIP  Final   Special Requests SWAB NO 2 PT ON ANCEF  Final   Gram Stain   Final    RARE WBC PRESENT,BOTH PMN AND MONONUCLEAR NO ORGANISMS SEEN    Culture   Final    NO GROWTH 2 DAYS NO ANAEROBES ISOLATED; CULTURE IN PROGRESS FOR 5 DAYS Performed at [facility], [place] [address] [place], [place] [address]    Report Status PENDING  Incomplete  Aerobic/Anaerobic Culture  (surgical/deep wound)  Status: None (Preliminary result)   Collection Time: 04/11/20  3:24 PM   Specimen: PATH Other; Tissue  Result Value Ref Range Status   Specimen Description TISSUE LEFT HIP  Final   Special Requests FROM HIP JOINT,PT ON ANCEF  Final   Gram Stain   Final    FEW WBC PRESENT, PREDOMINANTLY MONONUCLEAR NO ORGANISMS SEEN    Culture   Final    NO GROWTH 2 DAYS NO ANAEROBES ISOLATED; CULTURE IN PROGRESS FOR 5 DAYS Performed at [facility], [place] [address] [place], [place] [address]    Report Status PENDING  Incomplete  Culture, blood (routine x 2)     Status: None (Preliminary result)   Collection Time: 04/12/20 10:22 AM   Specimen: BLOOD RIGHT HAND  Result Value Ref Range Status   Specimen Description BLOOD RIGHT HAND  Final   Special Requests   Final    BOTTLES DRAWN AEROBIC AND ANAEROBIC Blood Culture adequate volume   Culture   Final    NO GROWTH < 12 HOURS Performed at [facility], [place] [address], [place], [place] [address]    Report Status PENDING  Incomplete  Culture, blood (routine x 2)     Status: None (Preliminary result)   Collection Time: 04/12/20 10:27 AM   Specimen: BLOOD RIGHT HAND  Result Value Ref Range Status   Specimen Description BLOOD RIGHT HAND  Final   Special Requests   Final    BOTTLES DRAWN AEROBIC ONLY Blood Culture adequate volume   Culture   Final    NO GROWTH < 12 HOURS Performed at [facility], [place] [address] [place], [place] [address]    Report Status PENDING  Incomplete        Radiology Studies: DG Pelvis Portable  Result Date: 04/11/2020 CLINICAL DATA:  Status post hip replacement. EXAM: PORTABLE PELVIS 1-2 VIEWS COMPARISON:  Pelvis CT, dated August 20, 2019 FINDINGS: Bilateral total hip replacements are seen. There is no evidence of surrounding lucency to suggest the presence of hardware loosening or infection. There is no evidence of pelvic fracture or diastasis. No pelvic bone lesions are  seen. IMPRESSION: Bilateral total hip replacements without evidence of hardware loosening or infection. Electronically Signed   By: Virgina Norfolk M.D.   On: 04/11/2020 18:59        Scheduled Meds: . docusate sodium  100 mg Oral BID  . enoxaparin (LOVENOX) injection  40 mg Subcutaneous Q24H  . escitalopram  20 mg Oral Daily  . multivitamin with minerals  1 tablet Oral Daily  . mupirocin ointment  1 application Nasal BID  . oxyCODONE  10 mg Oral Q12H  . Ensure Max Protein  11 oz Oral BID  . rifampin  600 mg Oral Daily   Continuous Infusions: .  ceFAZolin (ANCEF) IV 2 g (04/13/20 0949)     LOS: 3 days     Cordelia Poche, MD [facility] 04/13/2020, 11:31 AM  If 7PM-7AM, please contact night-coverage [email or web address]

## 2020-04-13 NOTE — Progress Notes (Signed)
Pharmacy Antibiotic Note  [name] is a 54 y.o. male admitted on 04/09/2020 with recurrent MSSA bacteremia, infection of left prosthetic hip joint, and possible osteomyelitis/septic arthritis. Pharmacy has been consulted for cefazolin dosing.  Patient is s/p left hip I&D with head/poly swap on 9/17. Repeat BCx obtained on 9/18, results pending. TTE has been performed on 9/18. Patient is afebrile, WBC normalized. Renal function normal with SCr 0.99.  Plan: Continue cefazolin 2 gm IV q8h Continue rifampin 600 mg PO daily per ID Monitor renal function, cultures, and LOT ID following  Height: 6' (182.9 cm) Weight: 105.2 kg (232 lb) IBW/kg (Calculated) : 77.6  Temp (24hrs), Avg:97.8 F (36.6 C), Min:97.8 F (36.6 C), Max:97.9 F (36.6 C)  Recent Labs  Lab 04/09/20 2235 04/10/20 0107 04/10/20 1420 04/11/20 0351 04/12/20 0348  WBC 13.6*  --  10.7* 10.1 10.9*  CREATININE 1.37*  --  1.26* 1.36* 0.99  LATICACIDVEN 2.0* 1.3  --   --   --     Estimated Creatinine Clearance: 106.9 mL/min (by C-G formula based on SCr of 0.99 mg/dL).    Allergies  Allergen Reactions  . Bee Venom Anaphylaxis  . Hydrocodone Nausea And Vomiting    (12/10/19 - pt says he has taken without issues)    Antimicrobials this admission: 9/16 cefepime x2 9/16 vancomycin >> 9/17 9/16 cefazolin >>  Microbiology results: 9/15 BCx: MSSA 9/16 UCx: MSSA 9/16 MRSA PCR positive 9/16 L hip synovial fluid cx: NG2D 9/17 Tissue cx: NG<24H 9/17 fluid cx: NG<24H 9/18 BCx NG<12h  Thank you for allowing pharmacy to be a part of this patient's care.  Berenice Bouton, PharmD PGY2 Pharmacy Resident Phone between 7 am - 3:30 pm: [phone]  Please check AMION for all [name] phone numbers After 10:00 PM, call [place] [phone]  04/13/2020 8:39 AM

## 2020-04-13 NOTE — Progress Notes (Signed)
  Subjective: Patient stable.  Pain reasonably well controlled.  Was up yesterday in the chair for several hours   Objective: Vital signs in last 24 hours: Temp:  [97.8 F (36.6 C)-97.9 F (36.6 C)] 97.8 F (36.6 C) (09/19 0431) Pulse Rate:  [64-65] 64 (09/19 0431) Resp:  [18] 18 (09/19 0431) BP: (107-110)/(52-65) 109/63 (09/19 0431) SpO2:  [100 %] 100 % (09/19 0431)  Intake/Output from previous day: 09/18 0701 - 09/19 0700 In: 1080 [P.O.:1080] Out: 2250 [Urine:2250] Intake/Output this shift: No intake/output data recorded.  Exam:  Dorsiflexion/Plantar flexion intact  Labs: Recent Labs    04/10/20 1420 04/11/20 0351 04/12/20 0348  HGB 13.0 11.5* 11.3*   Recent Labs    04/11/20 0351 04/12/20 0348  WBC 10.1 10.9*  RBC 3.64* 3.76*  HCT 35.3* 36.0*  PLT 319 346   Recent Labs    04/11/20 0351 04/12/20 0348  NA 132* 135  K 4.2 4.7  CL 102 104  CO2 22 22  BUN 25* 20  CREATININE 1.36* 0.99  GLUCOSE 104* 135*  CALCIUM 8.6* 8.8*   No results for input(s): LABPT, INR in the last 72 hours.  Assessment/Plan: Plan at this time is to continue IV antibiotics.  Continue mobilization with therapy until the patient is independent enough for discharge.   Landry Dyke Chaka Jefferys 04/13/2020, 9:50 AM

## 2020-04-13 NOTE — TOC Initial Note (Signed)
Transition of Care [facility]) - Initial/Assessment Note    Patient Details  Name: [name] MRN: [id number] Date of Birth: 27-Dec-1965  Transition of [facility]) CM/SW Contact:    Bary Castilla, LCSW Phone Number:[phone] 04/13/2020, 1:13 PM  Clinical Narrative:                 CSW met with patient to discuss OT recommendations of a possibly SNF placement. Patient informed CSW that he did not want to go to a SNF and would rather have [facility]. Patient explained that he has used [facility] in the past and would like to use them again.  Patient stated that he lives with his wife. CSW informed patient that she would note his request.  TOC team will continue to assist with discharge planning needs.  Expected Discharge Plan: [place] Barriers to Discharge: Continued Medical Work up   Patient Goals and CMS Choice Patient states their goals for this hospitalization and ongoing recovery are:: To go home   Choice offered to / list presented to : [place] / Guardian  Expected Discharge Plan and Services Expected Discharge Plan: [place] Choice: [place] arrangements for the past 2 months: Mobile Home                                      Prior Living Arrangements/Services Living arrangements for the past 2 months: Mobile Home Lives with:: Self, Spouse Patient language and need for interpreter reviewed:: Yes Do you feel safe going back to the place where you live?: Yes        Care giver support system in place?: Yes (comment)      Activities of Daily Living Home Assistive Devices/Equipment: None ADL Screening (condition at time of admission) Patient's cognitive ability adequate to safely complete daily activities?: Yes Is the patient deaf or have difficulty hearing?: No Does the patient have difficulty seeing, even when wearing glasses/contacts?: No Does the patient have difficulty concentrating, remembering, or  making decisions?: No Patient able to express need for assistance with ADLs?: Yes Does the patient have difficulty dressing or bathing?: No Independently performs ADLs?: Yes (appropriate for developmental age) Does the patient have difficulty walking or climbing stairs?: No Weakness of Legs: Both Weakness of Arms/Hands: None  Permission Sought/Granted   Permission granted to share information with : Yes, Verbal Permission Granted     Permission granted to share info w AGENCY: [facility]        Emotional Assessment Appearance:: Appears stated age Attitude/Demeanor/Rapport: Engaged Affect (typically observed): Accepting, Appropriate Orientation: : Oriented to Self, Oriented to Place, Oriented to  Time, Oriented to Situation      Admission diagnosis:  Septic arthritis ([place]) [M00.9] Left hip pain [M25.552] Febrile illness [R50.9] Myositis of left lower extremity, unspecified myositis type [M60.9] Patient Active Problem List   Diagnosis Date Noted  . Infection of left prosthetic hip joint ([place]) 04/11/2020  . MSSA bacteremia 04/11/2020  . Acute low back pain 04/11/2020  . Adjustment disorder with depressed mood 11/19/2019  . Left hip pain 10/09/2019  . Blurry vision, left eye 10/09/2019  . Hyponatremia 08/20/2019  . S/P lumbar laminectomy 07/25/2019  . H/O MSSA epidural abscess, L2-L5 07/25/2019  . Anemia 07/18/2019  . Obesity (BMI 35.0-39.9 without comorbidity) 07/18/2019  . Constipation 07/18/2019  . Elevated  blood sugar 04/01/2017  . Alcohol abuse, in remission 10/12/2016  . S/P total hip arthroplasty 10/12/2016   PCP:  Lesleigh Noe, MD Pharmacy:   [id number] [place] [id number]: [phone]: [phone]    Social Determinants of Health (SDOH) Interventions    Readmission Risk Interventions Readmission Risk Prevention Plan 08/21/2019  Transportation  Screening Complete  [place] or [place]  Social Work Consult for [place]anning/Counseling Complete  Palliative Care Screening Not Applicable  Medication Review (Press photographer Complete  Some recent data might be hidden

## 2020-04-13 NOTE — Evaluation (Addendum)
Occupational Therapy Evaluation Patient Details Name: [name] MRN: [id number] DOB: October 28, 1965 Today's Date: 04/13/2020    History of Present Illness 54yo male with septic arthritis, received L THR I&D with prosthetic head and poly swap on 04/11/20. PMH B THRs, lumbar laminectomy and decompression   Clinical Impression   PTA pt living with family and functioning at independent community level. 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 At time of eval, pt presents with ability to complete bed mobility at mod A and sit <> stands at min A level with RW from elevated surface. Pt very limited by pain this date- able to perform sit <> stand from side of bed for ~2 mins before needing to sit down and return to bed. RN informed of pain status. Pt is currently completing LB ADL at max A level. Given current status, recommend HHOT vs SNF pending mobility/ADL progress to support safety, BADL engagement, and independent PLOF. OT will continue to follow per POC listed below.     Follow Up Recommendations  SNF;Home health OT;Other (comment) (pending progress with mobility)    Equipment Recommendations  3 in 1 bedside commode;Wheelchair (measurements OT);Wheelchair cushion (measurements OT)    Recommendations for Other Services       Precautions / Restrictions Precautions Precautions: Fall;Posterior Hip Precaution Comments: reviewed throughout session; pt able to recall 3/3 Restrictions Weight Bearing Restrictions: Yes LLE Weight Bearing: Weight bearing as tolerated      Mobility Bed Mobility Overal bed mobility: Needs Assistance Bed Mobility: Supine to Sit     Supine to sit: Mod assist     General bed mobility comments: assist to manage BLEs (L>R) to EOB to prevent hip IR  Transfers Overall transfer level: Needs assistance Equipment used: Rolling walker (2 wheeled) Transfers: Sit to/from Stand Sit to Stand: Min assist;From elevated surface         General  transfer comment: increased time and effort to stand; able to maintain ~2 mins before needing to sit back down due to extreme pain    Balance Overall balance assessment: Needs assistance Sitting-balance support: Bilateral upper extremity supported;Feet supported Sitting balance-Leahy Scale: Good     Standing balance support: Bilateral upper extremity supported;No upper extremity supported Standing balance-Leahy Scale: Poor Standing balance comment: reliant on BUE support                           ADL either performed or assessed with clinical judgement   ADL Overall ADL's : Needs assistance/impaired Eating/Feeding: Set up;Sitting   Grooming: Set up;Sitting   Upper Body Bathing: Set up;Sitting   Lower Body Bathing: Maximal assistance;Sitting/lateral leans;Sit to/from stand   Upper Body Dressing : Set up;Sitting   Lower Body Dressing: Maximal assistance;Sitting/lateral leans;Sit to/from stand   Toilet Transfer: Moderate assistance;Maximal assistance;Stand-pivot Armed forces technical officer Details (indicate cue type and reason): pt able to stand from elevated bed this date, but very limited by pain and requiring increased assist. Toileting- Clothing Manipulation and Hygiene: Minimal assistance;Sitting/lateral lean         General ADL Comments: pt not able to progress in funcitonal mobility this date 2/2 pain limiting     Vision Patient Visual Report: No change from baseline       Perception     Praxis      Pertinent Vitals/Pain Pain Assessment: 0-10 Pain Score: 9  Pain Location: genearlized L LE pain with mobility Pain Descriptors / Indicators: Aching;Squeezing;Tightness;Sharp Pain  Intervention(s): Limited activity within patient's tolerance;Monitored during session     Hand Dominance     Extremity/Trunk Assessment Upper Extremity Assessment Upper Extremity Assessment: Overall WFL for tasks assessed   Lower Extremity Assessment Lower Extremity Assessment:  Defer to PT evaluation       Communication Communication Communication: No difficulties   Cognition Arousal/Alertness: Awake/alert Behavior During Therapy: Flat affect Overall Cognitive Status: Within Functional Limits for tasks assessed                                 General Comments: very flat affect but overall WFL. Internally distracted by pain   General Comments       Exercises     Shoulder Instructions      Home Living Family/patient expects to be discharged to:: Private residence Living Arrangements: Spouse/significant other;Children Available Help at Discharge: Family;Available 24 hours/day Type of Home: House Home Access: Stairs to enter [employer] of Steps: 2-3 Entrance Stairs-Rails: Can reach both Home Layout: One level     Bathroom Shower/Tub: Tub/shower unit;Walk-in shower;Door   [employer] Toilet: Standard     Home Equipment: Environmental consultant - 2 wheels;Bedside commode;Adaptive equipment;Crutches Adaptive Equipment: Reacher;Sock aid;Long-handled shoe horn;Long-handled sponge        Prior Functioning/Environment Level of Independence: Independent        Comments: Was independent and walking without device; works as a Therapist, music Problem List: Decreased knowledge of use of DME or AE;Decreased knowledge of precautions;Decreased activity tolerance;Impaired balance (sitting and/or standing);Pain      OT Treatment/Interventions: Self-care/ADL training;Therapeutic exercise;Patient/family education;Balance training;Therapeutic activities;DME and/or AE instruction    OT Goals(Current goals can be found in the care plan section) Acute Rehab OT Goals Patient Stated Goal: decrease pain OT Goal Formulation: With patient Time For Goal Achievement: 04/27/20 Potential to Achieve Goals: Good  OT Frequency: Min 2X/week   Barriers to D/C:            Co-evaluation              AM-PAC OT "6 Clicks" Daily Activity     Outcome  Measure Help from another person eating meals?: A Little Help from another person taking care of personal grooming?: A Little Help from another person toileting, which includes using toliet, bedpan, or urinal?: A Lot Help from another person bathing (including washing, rinsing, drying)?: A Lot Help from another person to put on and taking off regular upper body clothing?: A Little Help from another person to put on and taking off regular lower body clothing?: A Lot 6 Click Score: 15   End of Session Equipment Utilized During Treatment: Gait belt;Rolling walker Nurse Communication: Mobility status  Activity Tolerance: Patient tolerated treatment well Patient left: in bed;with call bell/phone within reach  OT Visit Diagnosis: Unsteadiness on feet (R26.81);Other abnormalities of gait and mobility (R26.89);Pain Pain - Right/Left: Left Pain - part of body: Leg;Hip                Time: 6203-5597 OT Time Calculation (min): 30 min Charges:  OT General Charges $OT Visit: 1 Visit OT Evaluation $OT Eval Moderate Complexity: 1 Mod OT Treatments $Self Care/Home Management : 8-22 mins  Zenovia Jarred, MSOT, OTR/L Acute Rehabilitation Services [facility] Office Number: [phone] Pager: [phone]  Zenovia Jarred 04/13/2020, 11:26 AM

## 2020-04-13 NOTE — Progress Notes (Signed)
PT Cancellation Note  Patient Details Name: [name] MRN: [id number] DOB: 11/14/1965   Cancelled Treatment:    Reason Eval/Treat Not Completed: Pain limiting ability to participate. Pt with complaints of severe left LE pain. Reports having pain meds ~20 minutes ago. Declining PT at this time due to pain. Agreeable to PT follow up later today. Will follow up as time allows today vs next day. Acute PT to continue.  Willow Ora, PTA, CLT Acute Rehab Services [phone] 04/13/20, 12:27 PM   Willow Ora 04/13/2020, 12:26 PM

## 2020-04-14 ENCOUNTER — Encounter (HOSPITAL_COMMUNITY): Payer: Self-pay | Admitting: Orthopaedic Surgery

## 2020-04-14 ENCOUNTER — Inpatient Hospital Stay: Payer: Self-pay

## 2020-04-14 LAB — BODY FLUID CULTURE: Culture: NO GROWTH

## 2020-04-14 LAB — CBC
HCT: 33.8 % — ABNORMAL LOW (ref 39.0–52.0)
Hemoglobin: 10.5 g/dL — ABNORMAL LOW (ref 13.0–17.0)
MCH: 30 pg (ref 26.0–34.0)
MCHC: 31.1 g/dL (ref 30.0–36.0)
MCV: 96.6 fL (ref 80.0–100.0)
Platelets: 400 10*3/uL (ref 150–400)
RBC: 3.5 MIL/uL — ABNORMAL LOW (ref 4.22–5.81)
RDW: 13.7 % (ref 11.5–15.5)
WBC: 7.2 10*3/uL (ref 4.0–10.5)
nRBC: 0 % (ref 0.0–0.2)

## 2020-04-14 LAB — BASIC METABOLIC PANEL
Anion gap: 10 (ref 5–15)
BUN: 15 mg/dL (ref 6–20)
CO2: 23 mmol/L (ref 22–32)
Calcium: 8.8 mg/dL — ABNORMAL LOW (ref 8.9–10.3)
Chloride: 104 mmol/L (ref 98–111)
Creatinine, Ser: 1.02 mg/dL (ref 0.61–1.24)
GFR calc Af Amer: >60 mL/min (ref >60)
GFR calc non Af Amer: >60 mL/min (ref >60)
Glucose, Bld: 104 mg/dL — ABNORMAL HIGH (ref 70–99)
Potassium: 3.9 mmol/L (ref 3.5–5.1)
Sodium: 137 mmol/L (ref 135–145)

## 2020-04-14 MED ORDER — SODIUM CHLORIDE 0.9% FLUSH: 10.0000 mL | INTRAVENOUS | Status: DC | PRN

## 2020-04-14 MED ORDER — OXYCODONE-ACETAMINOPHEN 5-325 MG PO TABS: 1.0000 | ORAL_TABLET | Freq: Four times a day (QID) | ORAL | 0 refills | Status: DC | PRN

## 2020-04-14 MED ORDER — CHLORHEXIDINE GLUCONATE CLOTH 2 % EX PADS
6.0000 | MEDICATED_PAD | Freq: Every day | CUTANEOUS | Status: DC
  Administered 2020-04-15: 6 via TOPICAL

## 2020-04-14 MED ORDER — ENOXAPARIN SODIUM 40 MG/0.4ML ~~LOC~~ SOLN: 40.0000 mg | SUBCUTANEOUS | 13 refills | Status: DC

## 2020-04-14 NOTE — Progress Notes (Signed)
PT Cancellation Note  Patient Details Name: [name] MRN: [id number] DOB: 1965/12/22   Cancelled Treatment:    Reason Eval/Treat Not Completed: Other (comment). Attempted x2 this am with pt with other disciplines both times. Will attempt later today vs another date. Continue with PT POC.   Willow Ora 04/14/2020, 12:18 PM  Willow Ora, PTA, CLT Acute Rehab Services [phone] 04/14/20, 12:18 PM

## 2020-04-14 NOTE — Progress Notes (Signed)
Patient ID: [name], male   DOB: [birth year], 54 y.o.   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 Will order PICC placement and count on 6 weeks of IV cefazolin plus oral rifampin prior to starting term suppressive oral cephalexin with rifampin.  PLAN: 1. Continue cefazolin and rifampin 2. PICC placement  Diagnosis: Bacteremia and left prosthetic hip infection  Culture Result: MSSA  Allergies  Allergen Reactions  . Bee Venom Anaphylaxis  . Hydrocodone Nausea And Vomiting    (12/10/19 - pt says he has taken without issues)    OPAT Orders Discharge antibiotics to be given via PICC line Discharge antibiotics: Per pharmacy protocol cefazolin  Duration: 6 weeks End Date: 05/20/2020  [facility] Care Per Protocol:  Home health RN for IV administration and teaching; PICC line care and labs.    Labs weekly while on IV antibiotics: _x_ CBC with differential _x_ BMP __ CMP _x_ CRP _x_ ESR __ Vancomycin trough __ CK  _x_ Please pull PIC at completion of IV antibiotics __ Please leave PIC in place until doctor has seen patient or been notified  Fax weekly labs to [phone]  Clinic Follow Up Appt: 05/20/2020  Principal Problem:   Infection of left prosthetic hip joint ([place]) Active Problems:   H/O MSSA epidural abscess, L2-L5   MSSA bacteremia   Acute low back pain   Scheduled Meds: . docusate sodium  100 mg Oral BID  . enoxaparin (LOVENOX) injection  40 mg Subcutaneous Q24H  . multivitamin with minerals  1 tablet Oral Daily  . mupirocin ointment  1 application Nasal BID  . oxyCODONE  10 mg Oral Q12H  . Ensure Max Protein  11 oz Oral BID  . rifampin  600 mg Oral Daily   Continuous Infusions: .  ceFAZolin (ANCEF) IV 2 g (04/14/20 0905)   PRN  Meds:.acetaminophen, alum & mag hydroxide-simeth, cyclobenzaprine, diphenhydrAMINE, HYDROmorphone (DILAUDID) injection, magnesium citrate, menthol-cetylpyridinium **OR** phenol, metoCLOPramide **OR** metoCLOPramide (REGLAN) injection, ondansetron **OR** ondansetron (ZOFRAN) IV, oxyCODONE, oxyCODONE, pantoprazole, polyethylene glycol, sorbitol   SUBJECTIVE: He is having left hip pain but overall is feeling better. He underwent I&D and polyethylene exchange for his prosthetic left hip infection 3 days ago. Operative cultures are negative at 48 hours. Repeat blood cultures are negative at 48 hours. No evidence of endocarditis on TEE.  Review of Systems: Review of Systems  Constitutional: Negative for chills, diaphoresis and fever.  Gastrointestinal: Negative for abdominal pain, diarrhea, nausea and vomiting.  Musculoskeletal: Positive for joint pain.    Allergies  Allergen Reactions  . Bee Venom Anaphylaxis  . Hydrocodone Nausea And Vomiting    (12/10/19 - pt says he has taken without issues)    OBJECTIVE: Vitals:   04/13/20 0431 04/13/20 1656 04/13/20 2101 04/14/20 0525  BP: 109/63 106/65 99/72 (!) 106/59  Pulse: 64 73 70 (!) 53  Resp: '18 17 17 17  ' Temp: 97.8 F (36.6 C) 98.9 F (37.2 C) 98.3 F (36.8 C) 99.3 F (37.4 C)  TempSrc: Oral Oral Oral Oral  SpO2: 100% 97% 97% 98%  Weight:      Height:       Body mass index is 31.46 kg/m.  Physical Exam Cardiovascular:     Rate and Rhythm:  Normal rate and regular rhythm.     Heart sounds: No murmur heard.   Pulmonary:     Effort: Pulmonary effort is normal.     Breath sounds: Normal breath sounds.  Musculoskeletal:     Comments: He has a clean dry surgical dressing over his left hip incision.  Psychiatric:        Mood and Affect: Mood normal.     Lab Results Lab Results  Component Value Date   WBC 7.2 04/14/2020   HGB 10.5 (L) 04/14/2020   HCT 33.8 (L) 04/14/2020   MCV 96.6 04/14/2020   PLT 400 04/14/2020    Lab  Results  Component Value Date   CREATININE 1.02 04/14/2020   BUN 15 04/14/2020   NA 137 04/14/2020   K 3.9 04/14/2020   CL 104 04/14/2020   CO2 23 04/14/2020    Lab Results  Component Value Date   ALT 51 (H) 04/11/2020   AST 48 (H) 04/11/2020   ALKPHOS 156 (H) 04/11/2020   BILITOT 1.0 04/11/2020     Microbiology: Recent Results (from the past 240 hour(s))  Culture, blood (routine x 2)     Status: Abnormal   Collection Time: 04/09/20 10:35 PM   Specimen: BLOOD  Result Value Ref Range Status   Specimen Description   Final    BLOOD LEFT ANTECUBITAL Performed at [facility], [place] [address], [place], [place] [address]    Special Requests   Final    BOTTLES DRAWN AEROBIC AND ANAEROBIC Blood Culture adequate volume Performed at [place] [address], [place], [place] [address]    Culture  Setup Time   Final    GRAM POSITIVE COCCI IN CLUSTERS IN BOTH AEROBIC AND ANAEROBIC BOTTLES CRITICAL RESULT CALLED TO, READ BACK BY AND VERIFIED WITH: Ellin Mayhew [facility] 04/10/20 AT 1650 SK Performed at [facility], [place] [address], [place], [place] [address]    Culture STAPHYLOCOCCUS AUREUS (A)  Final   Report Status 04/12/2020 FINAL  Final   Organism ID, Bacteria STAPHYLOCOCCUS AUREUS  Final      Susceptibility   Staphylococcus aureus - MIC*    CIPROFLOXACIN >=8 RESISTANT Resistant     ERYTHROMYCIN >=8 RESISTANT Resistant     GENTAMICIN <=0.5 SENSITIVE Sensitive     OXACILLIN 0.5 SENSITIVE Sensitive     TETRACYCLINE <=1 SENSITIVE Sensitive     VANCOMYCIN 1 SENSITIVE Sensitive     TRIMETH/SULFA <=10 SENSITIVE Sensitive     CLINDAMYCIN <=0.25 SENSITIVE Sensitive     RIFAMPIN <=0.5 SENSITIVE Sensitive     Inducible Clindamycin NEGATIVE Sensitive     * STAPHYLOCOCCUS AUREUS  Blood Culture ID Panel (Reflexed)     Status: Abnormal   Collection Time: 04/09/20 10:35 PM  Result Value Ref Range Status   Enterococcus faecalis NOT DETECTED NOT  DETECTED Final   Enterococcus Faecium NOT DETECTED NOT DETECTED Final   Listeria monocytogenes NOT DETECTED NOT DETECTED Final   Staphylococcus species DETECTED (A) NOT DETECTED Final    Comment: CRITICAL RESULT CALLED TO, READ BACK BY AND VERIFIED WITH: PHARMD M MACCIA 04/10/20 AT 1650 SK    Staphylococcus aureus (BCID) DETECTED (A) NOT DETECTED Final    Comment: CRITICAL RESULT CALLED TO, READ BACK BY AND VERIFIED WITH: PHARMD M [place] 04/10/20 AT 1650 SK    Staphylococcus epidermidis NOT DETECTED NOT DETECTED Final   Staphylococcus lugdunensis NOT DETECTED NOT DETECTED Final   Streptococcus species NOT DETECTED NOT DETECTED Final  Streptococcus agalactiae NOT DETECTED NOT DETECTED Final   Streptococcus pneumoniae NOT DETECTED NOT DETECTED Final   Streptococcus pyogenes NOT DETECTED NOT DETECTED Final   A.calcoaceticus-baumannii NOT DETECTED NOT DETECTED Final   Bacteroides fragilis NOT DETECTED NOT DETECTED Final   Enterobacterales NOT DETECTED NOT DETECTED Final   Enterobacter cloacae complex NOT DETECTED NOT DETECTED Final   Escherichia coli NOT DETECTED NOT DETECTED Final   Klebsiella aerogenes NOT DETECTED NOT DETECTED Final   Klebsiella oxytoca NOT DETECTED NOT DETECTED Final   Klebsiella pneumoniae NOT DETECTED NOT DETECTED Final   Proteus species NOT DETECTED NOT DETECTED Final   Salmonella species NOT DETECTED NOT DETECTED Final   Serratia marcescens NOT DETECTED NOT DETECTED Final   Haemophilus influenzae NOT DETECTED NOT DETECTED Final   Neisseria meningitidis NOT DETECTED NOT DETECTED Final   Pseudomonas aeruginosa NOT DETECTED NOT DETECTED Final   Stenotrophomonas maltophilia NOT DETECTED NOT DETECTED Final   Candida albicans NOT DETECTED NOT DETECTED Final   Candida auris NOT DETECTED NOT DETECTED Final   Candida glabrata NOT DETECTED NOT DETECTED Final   Candida krusei NOT DETECTED NOT DETECTED Final   Candida parapsilosis NOT DETECTED NOT DETECTED Final    Candida tropicalis NOT DETECTED NOT DETECTED Final   Cryptococcus neoformans/gattii NOT DETECTED NOT DETECTED Final   Meth resistant mecA/C and MREJ NOT DETECTED NOT DETECTED Final    Comment: Performed at [facility], [address] [place], [place] [address]  SARS Coronavirus 2 by RT PCR (hospital order, performed in [facility] lab) Nasopharyngeal Nasopharyngeal Swab     Status: None   Collection Time: 04/10/20  1:42 AM   Specimen: Nasopharyngeal Swab  Result Value Ref Range Status   SARS Coronavirus 2 NEGATIVE NEGATIVE Final    Comment: (NOTE) SARS-CoV-2 target nucleic acids are NOT DETECTED.  The SARS-CoV-2 RNA is generally detectable in upper and lower respiratory specimens during the acute phase of infection. The lowest concentration of SARS-CoV-2 viral copies this assay can detect is 250 copies / mL. A negative result does not preclude SARS-CoV-2 infection and should not be used as the sole basis for treatment or other patient management decisions.  A negative result may occur with improper specimen collection / handling, submission of specimen other than nasopharyngeal swab, presence of viral mutation(s) within the areas targeted by this assay, and inadequate number of viral copies (<250 copies / mL). A negative result must be combined with clinical observations, patient history, and epidemiological information.  Fact Sheet for Patients:   StrictlyIdeas.no  Fact Sheet for Healthcare Providers: BankingDealers.co.za  This test is not yet approved or  cleared by the [place] FDA and has been authorized for detection and/or diagnosis of SARS-CoV-2 by FDA under an Emergency Use Authorization (EUA).  This EUA will remain in effect (meaning this test can be used) for the duration of the COVID-19 declaration under Section 564(b)(1) of the Act, 21 U.S.C. section 360bbb-3(b)(1), unless the authorization is  terminated or revoked sooner.  Performed at [facility], [place] [address], [place], [place] [address]   Urine culture     Status: Abnormal   Collection Time: 04/10/20  3:00 AM   Specimen: Urine, Clean Catch  Result Value Ref Range Status   Specimen Description   Final    URINE, CLEAN CATCH Performed at [facility], [place] [address] [place], [place] [address]    Special Requests   Final    NONE Performed at [facility]  [name] [address] [place], [name] [address]    Culture >=100,000 COLONIES/mL STAPHYLOCOCCUS AUREUS (A)  Final   Report Status 04/12/2020 FINAL  Final   Organism ID, Bacteria STAPHYLOCOCCUS AUREUS (A)  Final      Susceptibility   Staphylococcus aureus - MIC*    CIPROFLOXACIN >=8 RESISTANT Resistant     GENTAMICIN <=0.5 SENSITIVE Sensitive     NITROFURANTOIN <=16 SENSITIVE Sensitive     OXACILLIN 0.5 SENSITIVE Sensitive     TETRACYCLINE <=1 SENSITIVE Sensitive     VANCOMYCIN <=0.5 SENSITIVE Sensitive     TRIMETH/SULFA <=10 SENSITIVE Sensitive     CLINDAMYCIN <=0.25 SENSITIVE Sensitive     RIFAMPIN <=0.5 SENSITIVE Sensitive     Inducible Clindamycin NEGATIVE Sensitive     * >=100,000 COLONIES/mL STAPHYLOCOCCUS AUREUS  Body fluid culture     Status: None   Collection Time: 04/10/20  4:05 PM   Specimen: Joint, Left Hip; Synovial Fluid  Result Value Ref Range Status   Specimen Description SYNOVIAL LEFT HIP  Final   Special Requests NONE  Final   Gram Stain   Final    ABUNDANT WBC PRESENT,BOTH PMN AND MONONUCLEAR NO ORGANISMS SEEN    Culture   Final    NO GROWTH 3 DAYS Performed at [facility], [address], [place], [place] [address]    Report Status 04/14/2020 FINAL  Final  Surgical PCR screen     Status: Abnormal   Collection Time: 04/10/20 11:00 PM   Specimen: Nasal Mucosa; Nasal Swab  Result Value Ref Range Status   MRSA, PCR POSITIVE (A) NEGATIVE Final    Comment: RESULT CALLED TO,  READ BACK BY AND VERIFIED WITH: Clydene Laming RN 04/11/20 0123 JDW    Staphylococcus aureus POSITIVE (A) NEGATIVE Final    Comment: (NOTE) The Xpert SA Assay (FDA approved for NASAL specimens in patients 76 years of age and older), is one component of a comprehensive surveillance program. It is not intended to diagnose infection nor to guide or monitor treatment. Performed at [facility], [place] [address], [place], [place] [address]   Aerobic/Anaerobic Culture (surgical/deep wound)     Status: None (Preliminary result)   Collection Time: 04/11/20  3:09 PM   Specimen: PATH Other; Body Fluid  Result Value Ref Range Status   Specimen Description FLUID LEFT HIP  Final   Special Requests SWAB NO 1 PT ON ANCEF  Final   Gram Stain   Final    RARE WBC PRESENT,BOTH PMN AND MONONUCLEAR NO ORGANISMS SEEN    Culture   Final    NO GROWTH 3 DAYS NO ANAEROBES ISOLATED; CULTURE IN PROGRESS FOR 5 DAYS Performed at [facility], [address], [place], [place] [address]    Report Status PENDING  Incomplete  Aerobic/Anaerobic Culture (surgical/deep wound)     Status: None (Preliminary result)   Collection Time: 04/11/20  3:11 PM   Specimen: PATH Other; Body Fluid  Result Value Ref Range Status   Specimen Description FLUID LEFT HIP  Final   Special Requests SWAB NO 2 PT ON ANCEF  Final   Gram Stain   Final    RARE WBC PRESENT,BOTH PMN AND MONONUCLEAR NO ORGANISMS SEEN    Culture   Final    NO GROWTH 3 DAYS NO ANAEROBES ISOLATED; CULTURE IN PROGRESS FOR 5 DAYS Performed at [facility], [place] [address] [place], [place] [address]    Report Status PENDING  Incomplete  Aerobic/Anaerobic Culture (surgical/deep wound)     Status: None (Preliminary result)   Collection Time: 04/11/20  3:24 PM   Specimen: PATH Other; Tissue  Result Value Ref Range Status   Specimen Description TISSUE LEFT HIP  Final   Special Requests FROM HIP JOINT,PT ON ANCEF  Final   Gram Stain   Final    FEW WBC  PRESENT, PREDOMINANTLY MONONUCLEAR NO ORGANISMS SEEN    Culture   Final    NO GROWTH 3 DAYS NO ANAEROBES ISOLATED; CULTURE IN PROGRESS FOR 5 DAYS Performed at [facility], [place] [address] [place], [place] [address]    Report Status PENDING  Incomplete  Culture, blood (routine x 2)     Status: None (Preliminary result)   Collection Time: 04/12/20 10:22 AM   Specimen: BLOOD RIGHT HAND  Result Value Ref Range Status   Specimen Description BLOOD RIGHT HAND  Final   Special Requests   Final    BOTTLES DRAWN AEROBIC AND ANAEROBIC Blood Culture adequate volume   Culture   Final    NO GROWTH 1 DAY Performed at [facility], [place] [address], [place], [place] [address]    Report Status PENDING  Incomplete  Culture, blood (routine x 2)     Status: None (Preliminary result)   Collection Time: 04/12/20 10:27 AM   Specimen: BLOOD RIGHT HAND  Result Value Ref Range Status   Specimen Description BLOOD RIGHT HAND  Final   Special Requests   Final    BOTTLES DRAWN AEROBIC ONLY Blood Culture adequate volume   Culture   Final    NO GROWTH 1 DAY Performed at [facility], [place] [address] [place], [place] [address]    Report Status PENDING  Incomplete    Michel Bickers, MD [facility] for [facility] [phone] pager   [phone] cell 04/14/2020, 12:56 PM

## 2020-04-14 NOTE — Plan of Care (Signed)
  Problem: Education: Goal: Knowledge of General Education information will improve Description Including pain rating scale, medication(s)/side effects and non-pharmacologic comfort measures Outcome: Progressing   

## 2020-04-14 NOTE — TOC Initial Note (Addendum)
Transition of Care [facility]) - Initial/Assessment Note    Patient Details  Name: STEPEHN [name] MRN: [id number] Date of Birth: 1966-06-16  Transition of Care [facility]) CM/SW Contact:    Marilu Favre, RN Phone Number: 04/14/2020, 12:44 PM  Clinical Narrative:                 Confirmed face sheet information with patient at bedside.  Patient from home with wife. Already has 3 in1 and walker at home.   Patient has been at home before with IV ABX. Patient aware he and his wife will be shown how to administer IV ABX because [facility] will not be present everytime a dose is due.    Called referral to [facility] with [facility] and Cory with [facility], left messages for both awaiting call back.   Tommi Rumps with [facility] can accept for HHPT and RN Pam with [facility] has accepted referral. Expected Discharge Plan: [place] Barriers to Discharge: Continued Medical Work up   Patient Goals and CMS Choice Patient states their goals for this hospitalization and ongoing recovery are:: to return to home CMS Medicare.gov Compare Post Acute Care list provided to:: Patient Choice offered to / list presented to : Patient  Expected Discharge Plan and Services Expected Discharge Plan: [place]   Discharge Planning Services: CM Consult Post Acute Care Choice: [place] arrangements for the past 2 months: Single Family Home                   DME Agency: NA       HH Arranged: PT, RN [place] Agency: [place] Date [facility] Agency Contacted: 04/14/20 Time [place] Agency Contacted: [phone] Representative spoke with at [place]: left message  Prior Living Arrangements/Services Living arrangements for the past 2 months: Single Family Home Lives with:: Spouse Patient language and need for interpreter reviewed:: Yes Do you feel safe going back to the place where you live?: Yes      Need for Family Participation in Patient Care: Yes (Comment) Care  giver support system in place?: Yes (comment) Current home services: DME Criminal Activity/Legal Involvement Pertinent to Current Situation/Hospitalization: No - Comment as needed  Activities of Daily Living Home Assistive Devices/Equipment: None ADL Screening (condition at time of admission) Patient's cognitive ability adequate to safely complete daily activities?: Yes Is the patient deaf or have difficulty hearing?: No Does the patient have difficulty seeing, even when wearing glasses/contacts?: No Does the patient have difficulty concentrating, remembering, or making decisions?: No Patient able to express need for assistance with ADLs?: Yes Does the patient have difficulty dressing or bathing?: No Independently performs ADLs?: Yes (appropriate for developmental age) Does the patient have difficulty walking or climbing stairs?: No Weakness of Legs: Both Weakness of Arms/Hands: None  Permission Sought/Granted   Permission granted to share information with : Yes, Verbal Permission Granted     Permission granted to share info w AGENCY: [facility], [facility]        Emotional Assessment Appearance:: Appears stated age Attitude/Demeanor/Rapport: Engaged Affect (typically observed): Accepting Orientation: : Oriented to Situation, Oriented to  Time, Oriented to Place, Oriented to Self Alcohol / Substance Use: Not Applicable Psych Involvement: No (comment)  Admission diagnosis:  Septic arthritis ([place]) [M00.9] Left hip pain [M25.552] Febrile illness [R50.9] Myositis of left lower extremity, unspecified myositis type [M60.9] Patient Active Problem List   Diagnosis Date Noted  . Infection of left prosthetic  hip joint ([place]) 04/11/2020  . MSSA bacteremia 04/11/2020  . Acute low back pain 04/11/2020  . Adjustment disorder with depressed mood 11/19/2019  . Left hip pain 10/09/2019  . Blurry vision, left eye 10/09/2019  . Hyponatremia 08/20/2019  . S/P lumbar laminectomy  07/25/2019  . H/O MSSA epidural abscess, L2-L5 07/25/2019  . Anemia 07/18/2019  . Obesity (BMI 35.0-39.9 without comorbidity) 07/18/2019  . Constipation 07/18/2019  . Elevated blood sugar 04/01/2017  . Alcohol abuse, in remission 10/12/2016  . S/P total hip arthroplasty 10/12/2016   PCP:  Lesleigh Noe, MD Pharmacy:   [id number] - [place], [address] [place] [address] Phone: [phone] Fax: [phone]     Social Determinants of Health (SDOH) Interventions    Readmission Risk Interventions Readmission Risk Prevention Plan 08/21/2019  Transportation Screening Complete  HRI or [place] Complete  Social Work Consult for [place] Planning/Counseling Complete  Palliative Care Screening Not Applicable  Medication Review Press photographer) Complete  Some recent data might be hidden

## 2020-04-14 NOTE — Progress Notes (Addendum)
PROGRESS NOTE    [name]  IWP:[id number] DOB: 11/03/1965 DOA: 04/09/2020 PCP: Lesleigh Noe, MD   Brief Narrative: [name] is a 54 y.o. male with medical history significant of lumbar epidural abscess, spinal stenosis status post decompression and laminectomy L3-4, L4-L5, and CT aspiration of right psoas abscess, MSSA bacteremia in December 2020. Patient presented secondary to left hip pain and found to have evidence of septic arthritis/infected prosthetic hip joint.   Assessment & Plan:   Principal Problem:   Infection of left prosthetic hip joint ([place]) Active Problems:   H/O MSSA epidural abscess, L2-L5   MSSA bacteremia   Acute low back pain   Infected prosthetic hip joint Septic arthritis Left sacral/iliac osteomyelitis Patient does not have sepsis related to these infections. Left hip joint was aspirated with fluid consistent with infected joint. Orthopedic surgery with plans for joint replacement. Patient started empirically on Vancomycin and Cefepime and transitioned to Cefazolin IV. MSSA on culture. Repeat blood cultures (9/18) with no growth to date. Transthoracic Echocardiogram without vegetation. -Infectious disease recommendations: Continue Cefazolin IV, Rifampin, repeat MRI eventually -Orthopedic surgery recommendations: WBAT with walker -Follow-up repeat blood cultures (9/18)  AKI Baseline hemoglobin of 0.7 from 09/2019. Creatinine of 1.37 on admission. Creatinine back to baseline. Resolved.  Hyponatremia Mild. Resolved with IV fluids.  Anxiety Depression Per patient, he is no longer taking Lexapro -Discontinue Lexapro on discharge medication list   DVT prophylaxis: SCDs Code Status:   Code Status: Full Code Family Communication: None at bedside Disposition Plan: Discharge home likely in 24 hours if blood culture remain negative in addition to infectious disease recommendations for outpatient regimen.   Consultants:   Orthopedic  surgery  Infectious disease  Procedures:   LEFT HIP I&D/HIP ARTHROPLASTY (04/11/2020)  TRANSTHORACIC ECHOCARDIOGRAM (04/12/2020)  Antimicrobials:  Vancomycin  Cefepime  Cefazolin    Subjective: No issues.  Objective: Vitals:   04/13/20 0431 04/13/20 1656 04/13/20 2101 04/14/20 0525  BP: 109/63 106/65 99/72 (!) 106/59  Pulse: 64 73 70 (!) 53  Resp: 18 17 17 17   Temp: 97.8 F (36.6 C) 98.9 F (37.2 C) 98.3 F (36.8 C) 99.3 F (37.4 C)  TempSrc: Oral Oral Oral Oral  SpO2: 100% 97% 97% 98%  Weight:      Height:        Intake/Output Summary (Last 24 hours) at 04/14/2020 1028 Last data filed at 04/14/2020 0905 Gross per 24 hour  Intake 210 ml  Output 1050 ml  Net -840 ml   Filed Weights   04/09/20 1402 04/11/20 0112  Weight: 104.3 kg 105.2 kg    Examination:  General exam: Appears calm and comfortable Respiratory system: Clear to auscultation. Respiratory effort normal. Cardiovascular system: S1 & S2 heard, RRR. No murmurs, rubs, gallops or clicks. Gastrointestinal system: Abdomen is nondistended, soft and nontender. No organomegaly or masses felt. Normal bowel sounds heard. Central nervous system: Alert and oriented. No focal neurological deficits. Musculoskeletal: Mild LE edema. No calf tenderness Skin: No cyanosis. No rashes Psychiatry: Judgement and insight appear normal. Mood & affect appropriate.     Data Reviewed: I have personally reviewed following labs and imaging studies  CBC Lab Results  Component Value Date   WBC 7.2 04/14/2020   RBC 3.50 (L) 04/14/2020   HGB 10.5 (L) 04/14/2020   HCT 33.8 (L) 04/14/2020   MCV 96.6 04/14/2020   MCH 30.0 04/14/2020   PLT 400 04/14/2020   MCHC 31.1 04/14/2020   RDW 13.7 04/14/2020  LYMPHSABS 0.9 04/10/2020   MONOABS 0.9 04/10/2020   EOSABS 0.0 04/10/2020   BASOSABS 0.0 75/44/9201     Last metabolic panel Lab Results  Component Value Date   NA 137 04/14/2020   K 3.9 04/14/2020   CL 104  04/14/2020   CO2 23 04/14/2020   BUN 15 04/14/2020   CREATININE 1.02 04/14/2020   GLUCOSE 104 (H) 04/14/2020   GFRNONAA >60 04/14/2020   GFRAA >60 04/14/2020   CALCIUM 8.8 (L) 04/14/2020   PHOS 4.6 08/27/2019   PROT 7.0 04/11/2020   ALBUMIN 2.2 (L) 04/11/2020   BILITOT 1.0 04/11/2020   ALKPHOS 156 (H) 04/11/2020   AST 48 (H) 04/11/2020   ALT 51 (H) 04/11/2020   ANIONGAP 10 04/14/2020    CBG (last 3)  No results for input(s): GLUCAP in the last 72 hours.   GFR: Estimated Creatinine Clearance: 103.8 mL/min (by C-G formula based on SCr of 1.02 mg/dL).  Coagulation Profile: No results for input(s): INR, PROTIME in the last 168 hours.  Recent Results (from the past 240 hour(s))  Culture, blood (routine x 2)     Status: Abnormal   Collection Time: 04/09/20 10:35 PM   Specimen: BLOOD  Result Value Ref [place] Status   Specimen Description   Final    BLOOD LEFT ANTECUBITAL Performed at [facility] [address] [place], [place] [address]    Special Requests   Final    BOTTLES DRAWN AEROBIC AND ANAEROBIC Blood Culture adequate volume Performed at [facility] [address], [place], [place] [address]    Culture  Setup Time   Final    GRAM POSITIVE COCCI IN CLUSTERS IN BOTH AEROBIC AND ANAEROBIC BOTTLES CRITICAL RESULT CALLED TO, READ BACK BY AND VERIFIED WITH: [facility] 04/10/20 AT 1650 SK Performed at [facility], [place] [address] [place], [place] [address]    Culture STAPHYLOCOCCUS AUREUS (A)  Final   Report Status 04/12/2020 FINAL  Final   Organism ID, Bacteria STAPHYLOCOCCUS AUREUS  Final      Susceptibility   Staphylococcus aureus - MIC*    CIPROFLOXACIN >=8 RESISTANT Resistant     ERYTHROMYCIN >=8 RESISTANT Resistant     GENTAMICIN <=0.5 SENSITIVE Sensitive     OXACILLIN 0.5 SENSITIVE Sensitive     TETRACYCLINE <=1 SENSITIVE Sensitive     VANCOMYCIN 1 SENSITIVE Sensitive     TRIMETH/SULFA <=10 SENSITIVE  Sensitive     CLINDAMYCIN <=0.25 SENSITIVE Sensitive     RIFAMPIN <=0.5 SENSITIVE Sensitive     Inducible Clindamycin NEGATIVE Sensitive     * STAPHYLOCOCCUS AUREUS  Blood Culture ID Panel (Reflexed)     Status: Abnormal   Collection Time: 04/09/20 10:35 PM  Result Value Ref [place] Status   Enterococcus faecalis NOT DETECTED NOT DETECTED Final   Enterococcus Faecium NOT DETECTED NOT DETECTED Final   Listeria monocytogenes NOT DETECTED NOT DETECTED Final   Staphylococcus species DETECTED (A) NOT DETECTED Final    Comment: CRITICAL RESULT CALLED TO, READ BACK BY AND VERIFIED WITH: PHARMD M MACCIA 04/10/20 AT 1650 SK    Staphylococcus aureus (BCID) DETECTED (A) NOT DETECTED Final    Comment: CRITICAL RESULT CALLED TO, READ BACK BY AND VERIFIED WITH: PHARMD M MACCIA 04/10/20 AT 1650 SK    Staphylococcus epidermidis NOT DETECTED NOT DETECTED Final   Staphylococcus lugdunensis NOT DETECTED NOT DETECTED Final   Streptococcus species NOT DETECTED NOT DETECTED Final   Streptococcus agalactiae NOT DETECTED NOT  DETECTED Final   Streptococcus pneumoniae NOT DETECTED NOT DETECTED Final   Streptococcus pyogenes NOT DETECTED NOT DETECTED Final   A.calcoaceticus-baumannii NOT DETECTED NOT DETECTED Final   Bacteroides fragilis NOT DETECTED NOT DETECTED Final   Enterobacterales NOT DETECTED NOT DETECTED Final   Enterobacter cloacae complex NOT DETECTED NOT DETECTED Final   Escherichia coli NOT DETECTED NOT DETECTED Final   Klebsiella aerogenes NOT DETECTED NOT DETECTED Final   Klebsiella oxytoca NOT DETECTED NOT DETECTED Final   Klebsiella pneumoniae NOT DETECTED NOT DETECTED Final   Proteus species NOT DETECTED NOT DETECTED Final   Salmonella species NOT DETECTED NOT DETECTED Final   Serratia marcescens NOT DETECTED NOT DETECTED Final   Haemophilus influenzae NOT DETECTED NOT DETECTED Final   Neisseria meningitidis NOT DETECTED NOT DETECTED Final   Pseudomonas aeruginosa NOT DETECTED NOT DETECTED  Final   Stenotrophomonas maltophilia NOT DETECTED NOT DETECTED Final   Candida albicans NOT DETECTED NOT DETECTED Final   Candida auris NOT DETECTED NOT DETECTED Final   Candida glabrata NOT DETECTED NOT DETECTED Final   Candida krusei NOT DETECTED NOT DETECTED Final   Candida parapsilosis NOT DETECTED NOT DETECTED Final   Candida tropicalis NOT DETECTED NOT DETECTED Final   Cryptococcus neoformans/gattii NOT DETECTED NOT DETECTED Final   Meth resistant mecA/C and MREJ NOT DETECTED NOT DETECTED Final    Comment: Performed at [facility], [address] [place], [place] [address]  SARS Coronavirus 2 by RT PCR (hospital order, performed in [facility] hospital lab) Nasopharyngeal Nasopharyngeal Swab     Status: None   Collection Time: 04/10/20  1:42 AM   Specimen: Nasopharyngeal Swab  Result Value Ref [place] Status   SARS Coronavirus 2 NEGATIVE NEGATIVE Final    Comment: (NOTE) SARS-CoV-2 target nucleic acids are NOT DETECTED.  The SARS-CoV-2 RNA is generally detectable in upper and lower respiratory specimens during the acute phase of infection. The lowest concentration of SARS-CoV-2 viral copies this assay can detect is 250 copies / mL. A negative result does not preclude SARS-CoV-2 infection and should not be used as the sole basis for treatment or other patient management decisions.  A negative result may occur with improper specimen collection / handling, submission of specimen other than nasopharyngeal swab, presence of viral mutation(s) within the areas targeted by this assay, and inadequate number of viral copies (<250 copies / mL). A negative result must be combined with clinical observations, patient history, and epidemiological information.  Fact Sheet for Patients:   StrictlyIdeas.no  Fact Sheet for Healthcare Providers: BankingDealers.co.za  This test is not yet approved or  cleared by the [place] FDA and has  been authorized for detection and/or diagnosis of SARS-CoV-2 by FDA under an Emergency Use Authorization (EUA).  This EUA will remain in effect (meaning this test can be used) for the duration of the COVID-19 declaration under Section 564(b)(1) of the Act, 21 U.S.C. section 360bbb-3(b)(1), unless the authorization is terminated or revoked sooner.  Performed at [facility], [place] [address] [place], [place] [address]   Urine culture     Status: Abnormal   Collection Time: 04/10/20  3:00 AM   Specimen: Urine, Clean Catch  Result Value Ref [place] Status   Specimen Description   Final    URINE, CLEAN CATCH Performed at [facility], [place] [address], [place], [place] [address]    Special Requests   Final    NONE Performed at [facility], [place]  [address] [place], [place] [address]    Culture >=100,000 COLONIES/mL STAPHYLOCOCCUS AUREUS (A)  Final   Report Status 04/12/2020 FINAL  Final   Organism ID, Bacteria STAPHYLOCOCCUS AUREUS (A)  Final      Susceptibility   Staphylococcus aureus - MIC*    CIPROFLOXACIN >=8 RESISTANT Resistant     GENTAMICIN <=0.5 SENSITIVE Sensitive     NITROFURANTOIN <=16 SENSITIVE Sensitive     OXACILLIN 0.5 SENSITIVE Sensitive     TETRACYCLINE <=1 SENSITIVE Sensitive     VANCOMYCIN <=0.5 SENSITIVE Sensitive     TRIMETH/SULFA <=10 SENSITIVE Sensitive     CLINDAMYCIN <=0.25 SENSITIVE Sensitive     RIFAMPIN <=0.5 SENSITIVE Sensitive     Inducible Clindamycin NEGATIVE Sensitive     * >=100,000 COLONIES/mL STAPHYLOCOCCUS AUREUS  Body fluid culture     Status: None   Collection Time: 04/10/20  4:05 PM   Specimen: Joint, Left Hip; Synovial Fluid  Result Value Ref [place] Status   Specimen Description SYNOVIAL LEFT HIP  Final   Special Requests NONE  Final   Gram Stain   Final    ABUNDANT WBC PRESENT,BOTH PMN AND MONONUCLEAR NO ORGANISMS SEEN    Culture   Final    NO GROWTH 3 DAYS Performed at [facility], [address], [place], [place] [address]    Report Status 04/14/2020 FINAL  Final  Surgical PCR screen     Status: Abnormal   Collection Time: 04/10/20 11:00 PM   Specimen: Nasal Mucosa; Nasal Swab  Result Value Ref [place] Status   MRSA, PCR POSITIVE (A) NEGATIVE Final    Comment: RESULT CALLED TO, READ BACK BY AND VERIFIED WITH: Clydene Laming RN 04/11/20 0123 JDW    Staphylococcus aureus POSITIVE (A) NEGATIVE Final    Comment: (NOTE) The Xpert SA Assay (FDA approved for NASAL specimens in patients 66 years of age and older), is one component of a comprehensive surveillance program. It is not intended to diagnose infection nor to guide or monitor treatment. Performed at [facility], [place] [address] [place], [place] [address]   Aerobic/Anaerobic Culture (surgical/deep wound)     Status: None (Preliminary result)   Collection Time: 04/11/20  3:09 PM   Specimen: PATH Other; Body Fluid  Result Value Ref [place] Status   Specimen Description FLUID LEFT HIP  Final   Special Requests SWAB NO 1 PT ON ANCEF  Final   Gram Stain   Final    RARE WBC PRESENT,BOTH PMN AND MONONUCLEAR NO ORGANISMS SEEN    Culture   Final    NO GROWTH 2 DAYS NO ANAEROBES ISOLATED; CULTURE IN PROGRESS FOR 5 DAYS Performed at [facility], [address], [place], [place] [address]    Report Status PENDING  Incomplete  Aerobic/Anaerobic Culture (surgical/deep wound)     Status: None (Preliminary result)   Collection Time: 04/11/20  3:11 PM   Specimen: PATH Other; Body Fluid  Result Value Ref [place] Status   Specimen Description FLUID LEFT HIP  Final   Special Requests SWAB NO 2 PT ON ANCEF  Final   Gram Stain   Final    RARE WBC PRESENT,BOTH PMN AND MONONUCLEAR NO ORGANISMS SEEN    Culture   Final    NO GROWTH 2 DAYS NO ANAEROBES ISOLATED; CULTURE IN PROGRESS FOR 5 DAYS Performed at [facility], [place] [address] [place], [place] [address]    Report Status PENDING  Incomplete   Aerobic/Anaerobic  Culture (surgical/deep wound)     Status: None (Preliminary result)   Collection Time: 04/11/20  3:24 PM   Specimen: PATH Other; Tissue  Result Value Ref [place] Status   Specimen Description TISSUE LEFT HIP  Final   Special Requests FROM HIP JOINT,PT ON ANCEF  Final   Gram Stain   Final    FEW WBC PRESENT, PREDOMINANTLY MONONUCLEAR NO ORGANISMS SEEN    Culture   Final    NO GROWTH 2 DAYS NO ANAEROBES ISOLATED; CULTURE IN PROGRESS FOR 5 DAYS Performed at [facility], [place] [address] [place], [place] [address]    Report Status PENDING  Incomplete  Culture, blood (routine x 2)     Status: None (Preliminary result)   Collection Time: 04/12/20 10:22 AM   Specimen: BLOOD RIGHT HAND  Result Value Ref [place] Status   Specimen Description BLOOD RIGHT HAND  Final   Special Requests   Final    BOTTLES DRAWN AEROBIC AND ANAEROBIC Blood Culture adequate volume   Culture   Final    NO GROWTH 1 DAY Performed at [facility], [place] [address] [place], [place] [address]    Report Status PENDING  Incomplete  Culture, blood (routine x 2)     Status: None (Preliminary result)   Collection Time: 04/12/20 10:27 AM   Specimen: BLOOD RIGHT HAND  Result Value Ref [place] Status   Specimen Description BLOOD RIGHT HAND  Final   Special Requests   Final    BOTTLES DRAWN AEROBIC ONLY Blood Culture adequate volume   Culture   Final    NO GROWTH 1 DAY Performed at [facility], [place] [address] [place], [place] [address]    Report Status PENDING  Incomplete        Radiology Studies: ECHOCARDIOGRAM COMPLETE  Result Date: 04/13/2020    ECHOCARDIOGRAM REPORT   Patient Name:   [name] Gade Date of Exam: 04/12/2020 Medical Rec #:  [id number]     Height:       72.0 in Accession #:    [id number]    Weight:       232.0 lb Date of Birth:  02/02/1966     BSA:          2.269 m Patient Age:    58 years      BP:           107/52 mmHg Patient Gender: M             HR:           64  bpm. Exam Location:  Inpatient Procedure: 2D Echo, Cardiac Doppler and Color Doppler Indications:    Bacteremia  History:        Patient has prior history of Echocardiogram examinations, most                 recent 07/27/2019. MSSA bacteremia.  Sonographer:    Clayton Lefort RDCS (AE) Referring Phys: [id number] [facility]  Sonographer Comments: Limited patient mobility. IMPRESSIONS  1. Left ventricular ejection fraction, by estimation, is 60 to 65%. The left ventricle has normal function. The left ventricle has no regional wall motion abnormalities. There is mild concentric left ventricular hypertrophy. Left ventricular diastolic parameters were normal.  2. Right ventricular systolic function is normal. The right ventricular size is normal. Tricuspid regurgitation signal is inadequate for assessing PA pressure.  3. The mitral valve is normal in structure. No evidence of mitral valve regurgitation.  No evidence of mitral stenosis.  4. The aortic valve was not well visualized. Aortic valve regurgitation is not visualized. No aortic stenosis is present.  5. Aortic dilatation noted. There is borderline dilatation of the aortic root, measuring 37 mm. There is mild dilatation of the ascending aorta, measuring 38 mm.  6. The inferior vena cava is normal in size with <50% respiratory variability, suggesting right atrial pressure of 8 mmHg. FINDINGS  Left Ventricle: Left ventricular ejection fraction, by estimation, is 60 to 65%. The left ventricle has normal function. The left ventricle has no regional wall motion abnormalities. The left ventricular internal cavity size was normal in size. There is  mild concentric left ventricular hypertrophy. Left ventricular diastolic parameters were normal. Normal left ventricular filling pressure. Right Ventricle: The right ventricular size is normal. No increase in right ventricular wall thickness. Right ventricular systolic function is normal. Tricuspid regurgitation signal is inadequate for  assessing PA pressure. Left Atrium: Left atrial size was normal in size. Right Atrium: Right atrial size was normal in size. Pericardium: There is no evidence of pericardial effusion. Mitral Valve: The mitral valve is normal in structure. No evidence of mitral valve regurgitation. No evidence of mitral valve stenosis. Tricuspid Valve: The tricuspid valve is normal in structure. Tricuspid valve regurgitation is trivial. No evidence of tricuspid stenosis. Aortic Valve: The aortic valve was not well visualized. Aortic valve regurgitation is not visualized. No aortic stenosis is present. Aortic valve mean gradient measures 3.0 mmHg. Aortic valve peak gradient measures 5.9 mmHg. Aortic valve area, by VTI measures 3.01 cm. Pulmonic Valve: The pulmonic valve was normal in structure. Pulmonic valve regurgitation is not visualized. No evidence of pulmonic stenosis. Aorta: Aortic dilatation noted. There is borderline dilatation of the aortic root, measuring 37 mm. There is mild dilatation of the ascending aorta, measuring 38 mm. Venous: The inferior vena cava is normal in size with less than 50% respiratory variability, suggesting right atrial pressure of 8 mmHg. IAS/Shunts: The interatrial septum appears to be lipomatous. No atrial level shunt detected by color flow Doppler.  LEFT VENTRICLE PLAX 2D LVIDd:         5.30 cm  Diastology LVIDs:         3.40 cm  LV e' medial:    9.14 cm/s LV PW:         1.80 cm  LV E/e' medial:  8.1 LV IVS:        1.50 cm  LV e' lateral:   11.70 cm/s LVOT diam:     2.40 cm  LV E/e' lateral: 6.3 LV SV:         77 LV SV Index:   34 LVOT Area:     4.52 cm  RIGHT VENTRICLE             IVC RV Basal diam:  3.00 cm     IVC diam: 2.00 cm RV S prime:     14.00 cm/s TAPSE (M-mode): 2.9 cm LEFT ATRIUM           Index       RIGHT ATRIUM           Index LA diam:      3.50 cm 1.54 cm/m  RA Area:     22.20 cm LA Vol (A2C): 29.7 ml 13.09 ml/m RA Volume:   69.70 ml  30.71 ml/m LA Vol (A4C): 66.7 ml 29.39  ml/m  AORTIC VALVE AV Area (Vmax):    3.27 cm AV Area (  Vmean):   3.04 cm AV Area (VTI):     3.01 cm AV Vmax:           121.00 cm/s AV Vmean:          79.800 cm/s AV VTI:            0.257 m AV Peak Grad:      5.9 mmHg AV Mean Grad:      3.0 mmHg LVOT Vmax:         87.40 cm/s LVOT Vmean:        53.600 cm/s LVOT VTI:          0.171 m LVOT/AV VTI ratio: 0.67  AORTA Ao Root diam: 3.70 cm Ao Asc diam:  3.80 cm MITRAL VALVE MV Area (PHT): 2.20 cm    SHUNTS MV Decel Time: 345 msec    Systemic VTI:  0.17 m MV E velocity: 74.10 cm/s  Systemic Diam: 2.40 cm MV A velocity: 48.40 cm/s MV E/A ratio:  1.53 Fransico Him MD Electronically signed by Fransico Him MD Signature Date/Time: 04/13/2020/1:23:10 PM    Final         Scheduled Meds: . docusate sodium  100 mg Oral BID  . enoxaparin (LOVENOX) injection  40 mg Subcutaneous Q24H  . multivitamin with minerals  1 tablet Oral Daily  . mupirocin ointment  1 application Nasal BID  . oxyCODONE  10 mg Oral Q12H  . Ensure Max Protein  11 oz Oral BID  . rifampin  600 mg Oral Daily   Continuous Infusions: .  ceFAZolin (ANCEF) IV 2 g (04/14/20 0905)     LOS: 4 days     Cordelia Poche, MD [facility] 04/14/2020, 10:28 AM  If 7PM-7AM, please contact night-coverage [email or web address]

## 2020-04-14 NOTE — Progress Notes (Signed)
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 To discharging provider:  please sign these orders via discharge navigator,  Select New Orders & click on the button choice - Manage This Unsigned Work.     Thank you for allowing pharmacy to be a part of this patient's care.  Cheyne Bungert D. Mina Marble, PharmD, BCPS, [place] 04/14/2020, 1:11 PM

## 2020-04-14 NOTE — Progress Notes (Signed)
Peripherally Inserted Central Catheter Placement  The IV Nurse has discussed with the patient and/or persons authorized to consent for the patient, the purpose of this procedure and the potential benefits and risks involved with this procedure.  The benefits include less needle sticks, lab draws from the catheter, and the patient may be discharged home with the catheter. Risks include, but not limited to, infection, bleeding, blood clot (thrombus formation), and puncture of an artery; nerve damage and irregular heartbeat and possibility to perform a PICC exchange if needed/ordered by physician.  Alternatives to this procedure were also discussed.  Bard Power PICC patient education guide, fact sheet on infection prevention and patient information card has been provided to patient /or left at bedside.    PICC Placement Documentation  PICC Single Lumen 04/14/20 PICC Right Brachial 43 cm 1 cm (Active)  Indication for Insertion or Continuance of Line Home intravenous therapies (PICC only) 04/14/20 2237  Exposed Catheter (cm) 1 cm 04/14/20 2237  Site Assessment Clean;Dry;Intact 04/14/20 2237  Line Status Flushed;Saline locked;Blood return noted 04/14/20 2237  Dressing Type Transparent 04/14/20 2237  Dressing Status Clean;Dry;Intact 04/14/20 2237  Antimicrobial disc in place? Yes 04/14/20 2237  Safety Lock Not Applicable 01/41/03 0131  Line Care Connections checked and tightened 04/14/20 2237  Line Adjustment (NICU/IV Team Only) No 04/14/20 2237  Dressing Intervention New dressing 04/14/20 2237  Dressing Change Due 04/21/20 04/14/20 2237       Nija Koopman, Nicolette Bang 04/14/2020, 10:38 PM

## 2020-04-14 NOTE — Discharge Instructions (Signed)
 INSTRUCTIONS AFTER JOINT REPLACEMENT   o Remove items at home which could result in a fall. This includes throw rugs or furniture in walking pathways o ICE to the affected joint every three hours while awake for 30 minutes at a time, for at least the first 3-5 days, and then as needed for pain and swelling.  Continue to use ice for pain and swelling. You may notice swelling that will progress down to the foot and ankle.  This is normal after surgery.  Elevate your leg when you are not up walking on it.   o Continue to use the breathing machine you got in the hospital (incentive spirometer) which will help keep your temperature down.  It is common for your temperature to cycle up and down following surgery, especially at night when you are not up moving around and exerting yourself.  The breathing machine keeps your lungs expanded and your temperature down.   DIET:  As you were doing prior to hospitalization, we recommend a well-balanced diet.  DRESSING / WOUND CARE / SHOWERING  You may change your surgical dressing 7 days after surgery.  Then change the dressing every day with sterile gauze.  Please use good hand washing techniques before changing the dressing.  Do not use any lotions or creams on the incision until instructed by your surgeon.  You may shower while you have the surgical dressing which is waterproof.  After removal of surgical dressing, you must cover the incision when showering.  ACTIVITY  o Increase activity slowly as tolerated, but follow the weight bearing instructions below.   o No driving for 6 weeks or until further direction given by your physician.  You cannot drive while taking narcotics.  o No lifting or carrying greater than 10 lbs. until further directed by your surgeon. o Avoid periods of inactivity such as sitting longer than an hour when not asleep. This helps prevent blood clots.  o You may return to work once you are authorized by your doctor.     WEIGHT  BEARING   Weight bearing as tolerated with assist device (walker, cane, etc) as directed, use it as long as suggested by your surgeon or therapist, typically at least 4-6 weeks.   EXERCISES  Results after joint replacement surgery are often greatly improved when you follow the exercise, range of motion and muscle strengthening exercises prescribed by your doctor. Safety measures are also important to protect the joint from further injury. Any time any of these exercises cause you to have increased pain or swelling, decrease what you are doing until you are comfortable again and then slowly increase them. If you have problems or questions, call your caregiver or physical therapist for advice.   Rehabilitation is important following a joint replacement. After just a few days of immobilization, the muscles of the leg can become weakened and shrink (atrophy).  These exercises are designed to build up the tone and strength of the thigh and leg muscles and to improve motion. Often times heat used for twenty to thirty minutes before working out will loosen up your tissues and help with improving the range of motion but do not use heat for the first two weeks following surgery (sometimes heat can increase post-operative swelling).   These exercises can be done on a training (exercise) mat, on the floor, on a table or on a bed. Use whatever works the best and is most comfortable for you.    Use music or television   while you are exercising so that the exercises are a pleasant break in your day. This will make your life better with the exercises acting as a break in your routine that you can look forward to.   Perform all exercises about fifteen times, three times per day or as directed.  You should exercise both the operative leg and the other leg as well.  Exercises include:   . Quad Sets - Tighten up the muscle on the front of the thigh (Quad) and hold for 5-10 seconds.   . Straight Leg Raises - With your  knee straight (if you were given a brace, keep it on), lift the leg to 60 degrees, hold for 3 seconds, and slowly lower the leg.  Perform this exercise against resistance later as your leg gets stronger.  . Leg Slides: Lying on your back, slowly slide your foot toward your buttocks, bending your knee up off the floor (only go as far as is comfortable). Then slowly slide your foot back down until your leg is flat on the floor again.  . Angel Wings: Lying on your back spread your legs to the side as far apart as you can without causing discomfort.  . Hamstring Strength:  Lying on your back, push your heel against the floor with your leg straight by tightening up the muscles of your buttocks.  Repeat, but this time bend your knee to a comfortable angle, and push your heel against the floor.  You may put a pillow under the heel to make it more comfortable if necessary.   A rehabilitation program following joint replacement surgery can speed recovery and prevent re-injury in the future due to weakened muscles. Contact your doctor or a physical therapist for more information on knee rehabilitation.    CONSTIPATION  Constipation is defined medically as fewer than three stools per week and severe constipation as less than one stool per week.  Even if you have a regular bowel pattern at home, your normal regimen is likely to be disrupted due to multiple reasons following surgery.  Combination of anesthesia, postoperative narcotics, change in appetite and fluid intake all can affect your bowels.   YOU MUST use at least one of the following options; they are listed in order of increasing strength to get the job done.  They are all available over the counter, and you may need to use some, POSSIBLY even all of these options:    Drink plenty of fluids (prune juice may be helpful) and high fiber foods Colace 100 mg by mouth twice a day  Senokot for constipation as directed and as needed Dulcolax (bisacodyl), take  with full glass of water  Miralax (polyethylene glycol) once or twice a day as needed.  If you have tried all these things and are unable to have a bowel movement in the first 3-4 days after surgery call either your surgeon or your primary doctor.    If you experience loose stools or diarrhea, hold the medications until you stool forms back up.  If your symptoms do not get better within 1 week or if they get worse, check with your doctor.  If you experience "the worst abdominal pain ever" or develop nausea or vomiting, please contact the office immediately for further recommendations for treatment.   ITCHING:  If you experience itching with your medications, try taking only a single pain pill, or even half a pain pill at a time.  You can also use Benadryl over the   counter for itching or also to help with sleep.   TED HOSE STOCKINGS:  Use stockings on both legs until for at least 2 weeks or as directed by physician office. They may be removed at night for sleeping.  MEDICATIONS:  See your medication summary on the "After Visit Summary" that nursing will review with you.  You may have some home medications which will be placed on hold until you complete the course of blood thinner medication.  It is important for you to complete the blood thinner medication as prescribed.  PRECAUTIONS:  If you experience chest pain or shortness of breath - call 911 immediately for transfer to the hospital emergency department.   If you develop a fever greater that 101 F, purulent drainage from wound, increased redness or drainage from wound, foul odor from the wound/dressing, or calf pain - CONTACT YOUR SURGEON.                                                   FOLLOW-UP APPOINTMENTS:  If you do not already have a post-op appointment, please call the office for an appointment to be seen by your surgeon.  Guidelines for how soon to be seen are listed in your "After Visit Summary", but are typically between 1-4 weeks  after surgery.  OTHER INSTRUCTIONS:   Knee Replacement:  Do not place pillow under knee, focus on keeping the knee straight while resting. CPM instructions: 0-90 degrees, 2 hours in the morning, 2 hours in the afternoon, and 2 hours in the evening. Place foam block, curve side up under heel at all times except when in CPM or when walking.  DO NOT modify, tear, cut, or change the foam block in any way.  MAKE SURE YOU:  . Understand these instructions.  . Get help right away if you are not doing well or get worse.   Dental Antibiotics:  In most cases prophylactic antibiotics for Dental procdeures after total joint surgery are not necessary.  Exceptions are as follows:  1. History of prior total joint infection  2. Severely immunocompromised (Organ Transplant, cancer chemotherapy, Rheumatoid biologic meds such as Humera)  3. Poorly controlled diabetes (A1C &gt; 8.0, blood glucose over 200)  If you have one of these conditions, contact your surgeon for an antibiotic prescription, prior to your dental procedure.  Thank you for letting us be a part of your medical care team.  It is a privilege we respect greatly.  We hope these instructions will help you stay on track for a fast and full recovery!      

## 2020-04-15 DIAGNOSIS — M4646 Discitis, unspecified, lumbar region: Secondary | ICD-10-CM | POA: Diagnosis not present

## 2020-04-15 DIAGNOSIS — T8452XA Infection and inflammatory reaction due to internal left hip prosthesis, initial encounter: Secondary | ICD-10-CM | POA: Diagnosis not present

## 2020-04-15 DIAGNOSIS — T8452XD Infection and inflammatory reaction due to internal left hip prosthesis, subsequent encounter: Secondary | ICD-10-CM

## 2020-04-15 DIAGNOSIS — R7881 Bacteremia: Secondary | ICD-10-CM | POA: Diagnosis not present

## 2020-04-15 MED ORDER — CEFAZOLIN IV (FOR PTA / DISCHARGE USE ONLY): 2.0000 g | Freq: Three times a day (TID) | INTRAVENOUS | 0 refills | Status: DC

## 2020-04-15 MED ORDER — SENNOSIDES-DOCUSATE SODIUM 8.6-50 MG PO TABS: 1.0000 | ORAL_TABLET | Freq: Two times a day (BID) | ORAL | 0 refills | Status: DC

## 2020-04-15 MED ORDER — POLYETHYLENE GLYCOL 3350 17 G PO PACK
17.0000 g | PACK | Freq: Two times a day (BID) | ORAL | 0 refills | Status: DC
Start: 2020-04-15 — End: 2020-12-08

## 2020-04-15 MED ORDER — RIFAMPIN 300 MG PO CAPS: 600.0000 mg | ORAL_CAPSULE | Freq: Every day | ORAL | 0 refills | Status: DC

## 2020-04-15 MED ORDER — SODIUM CHLORIDE 0.9 % IV SOLN
INTRAVENOUS | Status: DC | PRN
  Administered 2020-04-15: 250 mL via INTRAVENOUS

## 2020-04-15 MED ORDER — HEPARIN SOD (PORK) LOCK FLUSH 100 UNIT/ML IV SOLN
250.0000 [IU] | INTRAVENOUS | Status: AC | PRN
  Administered 2020-04-15: 250 [IU]
  Filled 2020-04-15: qty 2.5

## 2020-04-15 MED ORDER — POLYETHYLENE GLYCOL 3350 17 G PO PACK
17.0000 g | PACK | Freq: Two times a day (BID) | ORAL | Status: DC
  Administered 2020-04-15: 17 g via ORAL
  Filled 2020-04-15: qty 1

## 2020-04-15 MED ORDER — SENNOSIDES-DOCUSATE SODIUM 8.6-50 MG PO TABS
1.0000 | ORAL_TABLET | Freq: Two times a day (BID) | ORAL | Status: DC
  Administered 2020-04-15: 1 via ORAL
  Filled 2020-04-15: qty 1

## 2020-04-15 NOTE — Progress Notes (Signed)
Occupational Therapy Treatment Patient Details Name: AIRON [name] MRN: [id number] DOB: [birth year] Today's Date: 04/15/2020    History of present illness 54yo male with septic arthritis, received L THR I&D with prosthetic head and poly swap on 04/11/20. PMH B THRs, lumbar laminectomy and decompression   OT comments  Pt received in bed, awake and alert, agreeable to session to address safety/hip precautions verbally and demonstration pt able to state 2/3. Pt Mod A bed mobility to sit, mostly assistance with presenting LLE to EOB, VC's for use of hand rail to assist with trunk elevation. Agreeable to [facility] transfer, with min A push to stand, Mod A stand pivot. Improved sequencing presenting back to bed with Min A. Set up for oral care and grooming at EOB. Pt to be dc to HHOT.   Follow Up Recommendations  SNF;Home health OT;Other (comment) (pending progress with mobility)    Equipment Recommendations  3 in 1 bedside commode;Wheelchair (measurements OT);Wheelchair cushion (measurements OT)    Recommendations for Other Services      Precautions / Restrictions Precautions Precautions: Fall;Posterior Hip Precaution Comments: reviewed throughout session; pt able to recall 3/3 Restrictions Weight Bearing Restrictions: Yes LLE Weight Bearing: Weight bearing as tolerated       Mobility Bed Mobility Overal bed mobility: Needs Assistance Bed Mobility: Supine to Sit     Supine to sit: Mod assist     General bed mobility comments: assist to manage BLEs  to EOB as well as assist to elevate trunk, vc's for hand placement on grab bars.   Transfers Overall transfer level: Needs assistance Equipment used: Rolling walker (2 wheeled) Transfers: Sit to/from Stand Sit to Stand: From elevated surface;Min assist         General transfer comment: increased time and effort to stand, pt limited with pain tolerance.    Balance Overall balance assessment: Needs assistance Sitting-balance support:  Bilateral upper extremity supported;Feet supported Sitting balance-Leahy Scale: Good     Standing balance support: Bilateral upper extremity supported;No upper extremity supported Standing balance-Leahy Scale: Poor Standing balance comment: reliant on BUE support                           ADL either performed or assessed with clinical judgement   ADL Overall ADL's : Needs assistance/impaired     Grooming: Set up;Sitting Grooming Details (indicate cue type and reason): sitting EOB             Lower Body Dressing: Maximal assistance;Sitting/lateral leans;Sit to/from stand   Toilet Transfer: Moderate assistance;Maximal assistance;Stand-pivot;Total assistance;Cueing for safety;Cueing for sequencing Toilet Transfer Details (indicate cue type and reason): pt able to stand from elevated bed this date, but very limited by pain and requiring increased assist. cueing for hand placement with stand to sit at [facility].            General ADL Comments: session address BSC transfer and safety with RW for functional task.      Vision       Perception     Praxis      Cognition Arousal/Alertness: Awake/alert Behavior During Therapy: Flat affect Overall Cognitive Status: Within Functional Limits for tasks assessed                                 General Comments: very flat affect but overall WFL. Internally distracted by pain  Exercises     Shoulder Instructions       General Comments      Pertinent Vitals/ Pain       Pain Assessment: 0-10 Pain Score: 9  (7 prior to movement. medicated at start of session.) Pain Location: genearlized L LE pain with mobility Pain Descriptors / Indicators: Aching;Squeezing;Tightness;[place] expects to be discharged to:: Private residence Living Arrangements: Spouse/significant other;Children                                      Prior Functioning/Environment               Frequency  Min 2X/week        Progress Toward Goals  OT Goals(current goals can now be found in the care plan section)  Progress towards OT goals: Progressing toward goals (slowly progressing. )  Acute Rehab OT Goals Patient Stated Goal: decrease pain OT Goal Formulation: With patient Time For Goal Achievement: 04/27/20 Potential to Achieve Goals: Good ADL Goals Pt Will Perform Grooming: with min assist;standing Pt Will Perform Lower Body Bathing: with min assist;sit to/from stand;sitting/lateral leans;with adaptive equipment Pt Will Perform Lower Body Dressing: with min assist;sitting/lateral leans;sit to/from stand;with adaptive equipment Pt Will Transfer to Toilet: with min assist;ambulating;regular height toilet;grab bars Additional ADL Goal #1: Pt will recall and apply posterior hip precautions to all necessary ADL  Plan Discharge plan remains appropriate;Frequency remains appropriate    Co-evaluation                 AM-PAC OT "6 Clicks" Daily Activity     Outcome Measure   Help from another person eating meals?: A Little Help from another person taking care of personal grooming?: A Little Help from another person toileting, which includes using toliet, bedpan, or urinal?: A Lot Help from another person bathing (including washing, rinsing, drying)?: A Lot Help from another person to put on and taking off regular upper body clothing?: A Little Help from another person to put on and taking off regular lower body clothing?: A Lot 6 Click Score: 15    End of Session Equipment Utilized During Treatment: Gait belt;Rolling walker  OT Visit Diagnosis: Unsteadiness on feet (R26.81);Other abnormalities of gait and mobility (R26.89);Pain Pain - Right/Left: Left Pain - part of body: Leg;Hip   Activity Tolerance Patient limited by pain   Patient Left in bed;with call bell/phone within reach   Nurse Communication Mobility status        Time:  1005-1040 OT Time Calculation (min): 35 min  Charges: OT General Charges $OT Visit: 1 Visit OT Treatments $Self Care/Home Management : 23-37 mins  Minus Breeding, MSOT, OTR/L  Supplemental Rehabilitation Services  [phone]    Marius Ditch 04/15/2020, 11:57 AM

## 2020-04-15 NOTE — Discharge Summary (Signed)
Physician Discharge Summary  [name] XBD:[id number] DOB: [birth year] DOA: 04/09/2020  PCP: Lesleigh Noe, MD  Admit date: 04/09/2020 Discharge date: 04/15/2020  Admitted From: Home Disposition: Home  Recommendations for Outpatient Follow-up:  1. Follow up with PCP in 1 week 2. Please obtain BMP/CBC in one week 3. Please follow up on the following pending results: None  Home Health: PT, OT Equipment/Devices: 3 in 1, bedside commode, rolling walker  Discharge Condition: Guarded CODE STATUS: Full code Diet recommendation: Heart healthy   Brief/Interim Summary:  Admission HPI written by Lequita Halt, MD   Chief Complaint: Left hip pain  HPI: [name] is a 54 y.o. male with medical history significant of lumbar epidural abscess, spinal stenosis status post decompression and laminectomy L3-4, L4-L5, and CT aspiration of right psoas abscess, MSSA bacteremia in December 2020, with extensive hospital stay, IV antibiotics (total of 6 weeks till Feb 2021) and wound care with the wound VAC on the sacral pressure ulcer until June this year, and 2 small wound well healed and patient was discharged home with her on July/14.  Patient reports that been doing well no more open wound on his back.  Saturday morning, he woke up with severe left hip pain, sudden onset, denies any fever chills.  No back pain.  Pain was sharp like, episodic, worsening with putting weight on or walk on the left hip.  Denied any superficial infection, no fever chills last week, no urinary symptoms no diarrhea no URI symptoms including cough shortness of breath runny nose all week last week. ED Course: Left hip MRI showed left hip arthritis, left medial acetabulum concerning for osteomyelitis, left gluteus minimus left piriformis and left iliac myositis and 3.1 cm abscess in the left ilacus muscle.  WBC 13.6, creatinine 1.3.  Sodium 132.  Significant elevation of CRP and ESR.   Hospital course:  Infected  prosthetic hip joint Septic arthritis Left sacral/iliac osteomyelitis Patient does not have sepsis related to these infections. Left hip joint was aspirated with fluid consistent with infected joint. Orthopedic surgery with plans for joint replacement. Patient started empirically on Vancomycin and Cefepime and transitioned to Cefazolin IV. MSSA on culture. Repeat blood cultures (9/18) with no growth to date. Transthoracic Echocardiogram without vegetation. Orthopedic surgery recommending weight bearing as tolerated with a walker. PT/OT recommending SNF but patient is requesting home. Per ID, patient will discharge on Cefazolin and Rifampin with an end date of 05/20/2020. PICC line placed prior to discharge.  AKI Baseline hemoglobin of 0.7 from 09/2019. Creatinine of 1.37 on admission. Creatinine back to baseline. Resolved.  Hyponatremia Mild. Resolved with IV fluids.  Anxiety Depression Per patient, he is no longer taking Lexapro. Discontinue Lexapro on discharge medication list.  Discharge Diagnoses:  Principal Problem:   Infection of left prosthetic hip joint ([place]) Active Problems:   H/O MSSA epidural abscess, L2-L5   MSSA bacteremia   Acute low back pain    Discharge Instructions  Discharge Instructions    [facility] pharmacist to adjust dose for Vancomycin, Aminoglycosides and other anti-infective therapies as requested by physician.   Complete by: As directed    [facility] to provide Cath Flo 63m   Complete by: As directed    Administer for PICC line occlusion and as ordered by physician for other access device issues.   Anaphylaxis Kit: Provided to treat any anaphylactic reaction to the medication being provided to the patient if First Dose or when requested  by physician   Complete by: As directed    Epinephrine 62m/ml vial / amp: Administer 0.348m(0.74m57msubcutaneously once for moderate to severe anaphylaxis, nurse to call physician and pharmacy  when reaction occurs and call 911 if needed for immediate care   Diphenhydramine 65m62m IV vial: Administer 25-65mg51mIM PRN for first dose reaction, rash, itching, mild reaction, nurse to call physician and pharmacy when reaction occurs   Sodium Chloride 0.9% NS 500ml 774mAdminister if needed for hypovolemic blood pressure drop or as ordered by physician after call to physician with anaphylactic reaction   Call MD for:  redness, tenderness, or signs of infection (pain, swelling, redness, odor or green/yellow discharge around incision site)   Complete by: As directed    Call MD for:  severe uncontrolled pain   Complete by: As directed    Call MD for:  temperature >100.4   Complete by: As directed    Change dressing on IV access line weekly and PRN   Complete by: As directed    Diet - low sodium heart healthy   Complete by: As directed    Discharge wound care:   Complete by: As directed    Reinforce dressing as needed   Flush IV access with Sodium Chloride 0.9% and Heparin 10 units/ml or 100 units/ml   Complete by: As directed    Home infusion instructions - [facility]   Complete by: As directed    Instructions: Flush IV access with Sodium Chloride 0.9% and Heparin 10units/ml or 100units/ml   Change dressing on IV access line: Weekly and PRN   Instructions Cath Flo 2mg: A42mnister for PICC Line occlusion and as ordered by physician for other access device   [facility] pharmacist to adjust dose for: Vancomycin, Aminoglycosides and other anti-infective therapies as requested by physician   Increase activity slowly   Complete by: As directed    Method of administration may be changed at the discretion of home infusion pharmacist based upon assessment of the patient and/or caregiver's ability to self-administer the medication ordered   Complete by: As directed      Allergies as of 04/15/2020      Reactions   Bee Venom Anaphylaxis   Hydrocodone Nausea And Vomiting     (12/10/19 - pt says he has taken without issues)      Medication List    STOP taking these medications   acetaminophen 325 MG tablet Commonly known as: TYLENOL   escitalopram 20 MG tablet Commonly known as: Lexapro   Oxycodone HCl 10 MG Tabs   Oxycodone HCl 20 MG Tabs     TAKE these medications   ceFAZolin  IVPB Commonly known as: ANCEF Inject 2 g into the vein every 8 (eight) hours. 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   cyclobenzaprine 10 MG tablet Commonly known as: FLEXERIL TAKE 1 TABLET BY MOUTH THREE TIMES A DAY AS NEEDED FOR MUSCLE SPASMS What changed: See the new instructions.   enoxaparin 40 MG/0.4ML injection Commonly known as: LOVENOX Inject 0.4 mLs (40 mg total) into the skin daily.   omeprazole  20 MG tablet Commonly known as: PRILOSEC OTC Take 20 mg by mouth daily as needed (heart burn).   oxyCODONE-acetaminophen 5-325 MG tablet Commonly known as: Percocet Take 1-2 tablets by mouth every 6 (six) hours as needed for severe pain.   polyethylene glycol 17 g packet Commonly known as: MIRALAX / GLYCOLAX Take 17 g by mouth 2 (two) times daily.   rifampin 300 MG capsule Commonly known as: Rifadin Take 2 capsules (600 mg total) by mouth daily.   senna-docusate 8.6-50 MG tablet Commonly known as: Senokot-S Take 1 tablet by mouth 2 (two) times daily.            Durable Medical Equipment  (From admission, onward)         Start     Ordered   04/11/20 1736  DME Walker rolling  Once       Question:  Patient needs a walker to treat with the  following condition  Answer:  History of hip replacement   04/11/20 1735   04/11/20 1736  DME 3 n 1  Once        04/11/20 1735   04/11/20 1736  DME Bedside commode  Once       Question:  Patient needs a bedside commode to treat with the following condition  Answer:  History of hip replacement   04/11/20 1735           Discharge Care Instructions  (From admission, onward)         Start     Ordered   04/15/20 0000  Change dressing on IV access line weekly and PRN  (Home infusion instructions - [facility] )        04/15/20 1058   04/15/20 0000  Discharge wound care:       Comments: Reinforce dressing as needed   04/15/20 1058          Follow-up Information    Leandrew Koyanagi, MD In 2 weeks.   Specialty: Orthopedic Surgery Why: For suture removal, For wound re-check Contact information: [place] [address] [phone]        Care, [facility] Follow up.   Specialty: Home Health Services Contact information: [place] [address] [phone]              Allergies  Allergen Reactions  . Bee Venom Anaphylaxis  . Hydrocodone Nausea And Vomiting    (12/10/19 - pt says he has taken without issues)    Consultations:  Orthopedic surgery  Infectious disease   Procedures/Studies: DG Tibia/Fibula Left  Result Date: 04/09/2020 CLINICAL DATA:  Leg pain EXAM: LEFT TIBIA AND FIBULA - 2 VIEW COMPARISON:  None. FINDINGS: There is no evidence of fracture or other focal bone lesions. Soft tissues are unremarkable. IMPRESSION: Negative. Electronically Signed   By: Fidela Salisbury MD   On: 04/09/2020 15:03   MR Lumbar Spine W Wo Contrast  Result Date: 04/10/2020 CLINICAL DATA:  Low back pain, infection suspected. Additional history provided: Left hip and leg pain for 1 week, history of discitis/osteomyelitis, history of bilateral hip replacements. EXAM: MRI LUMBAR SPINE WITHOUT AND WITH CONTRAST  TECHNIQUE: Multiplanar and multiecho pulse sequences of the lumbar spine were obtained without and with intravenous contrast. CONTRAST:  56m GADAVIST GADOBUTROL 1 MMOL/ML IV SOLN COMPARISON:  Lumbar spine MRI 11/09/2019. CT abdomen/pelvis 04/10/2020. FINDINGS: Segmentation: For the purposes of this dictation, five lumbar vertebrae are assumed and the caudal most  well-formed intervertebral disc is designated L5-S1. Alignment: Straightening of the expected lumbar lordosis. L3-L4 and L4-L5 grade 1 retrolisthesis. Vertebrae: Vertebral body height is maintained. Multilevel vertebral body hemangiomas. Multilevel degenerative endplate irregularity with small Schmorl nodes. There is incompletely imaged marrow edema within the left sacrum and left iliac bone abutting the left SI joint (series 6, image 16). These findings raise the possibility of osteomyelitis at this site, possibly with associated sacroiliitis. There is mild edema signal along the margins of the L3-L4 disc space (for instance as seen on series 14, images 4-13). Prominent multilevel ventrolateral osteophytes. Conus medullaris and cauda equina: Conus extends to the L1-L2 level. No signal abnormality within the visualized distal spinal cord. Paraspinal and other soft tissues: There is prominent edema signal and enhancement within the posterior aspect of the left iliopsoas muscle (for instance as seen on series 8, image 40) (series 15, image 38). These findings are consistent with infection/phlegmon. Centrally within this region there are few tiny (subcentimeter) foci of hypoenhancement which could reflect tiny abscesses (series 15, image 18). Disc levels: Unless otherwise stated, the level by level findings below have not significantly changed since prior MRI 11/09/2019. Multilevel disc degeneration. Most notably, there is moderate disc degeneration at L3-L4. L1-L2: Mild disc bulge and facet arthrosis. No significant spinal canal stenosis or neural foraminal  narrowing. L2-L3: Mild disc bulging and facet arthrosis. No significant spinal canal stenosis or neural foraminal narrowing. L3-L4: Grade 1 retrolisthesis. Prior posterior decompression. Disc bulge with endplate spurring. Facet hypertrophy. Mild bilateral subarticular narrowing. Central canal patent. Mild bilateral inferior neural foraminal narrowing. Unchanged mild nonspecific fluid signal and enhancement within the facet joints bilaterally. L4-L5: Grade 1 retrolisthesis. Prior posterior decompression. Disc bulge with endplate spurring. Facet hypertrophy. Mild bilateral subarticular narrowing. Mild bilateral neural foraminal narrowing. Unchanged mild nonspecific fluid signal and enhancement within the facet joints bilaterally. L5-S1: Bilateral facet arthrosis (severe right, moderate left). No significant disc herniation, spinal canal stenosis or neural foraminal narrowing. IMPRESSION: Incompletely imaged marrow edema within the left sacrum and iliac bone, abutting the left sacroiliac joint. Findings raise the possibility of osteomyelitis at this site, possibly with left sacroiliitis. Consider contrast-enhanced MR imaging of the sacrum and pelvis for further evaluation. Partially imaged prominent edema and enhancement within the left iliopsoas muscle consistent with infection/phlegmon. Centrally within this region, there are several tiny (subcentimeter) foci of relative hypoenhancement which may reflect tiny abscesses. Unchanged subtle enhancement along the margins of the L3-L4 disc space, nonspecific but possibly reflecting mild residual discitis/osteomyelitis. Mild fluid signal and enhancement within the bilateral L3-L4 and L4-L5 facet joints. These findings are unchanged and also nonspecific. Infection cannot be excluded at these sites. Lumbar spondylosis as outlined and unchanged. No more than mild spinal canal stenosis or neural foraminal narrowing. Electronically Signed   By: Kellie Simmering DO   On: 04/10/2020  11:23   MR HIP LEFT W WO CONTRAST  Result Date: 04/10/2020 CLINICAL DATA:  Left hip and leg pain for the past week. Concern for septic arthritis. History of lumbar osteomyelitis-discitis. Prior bilateral hip arthroplasties. EXAM: MRI OF THE LEFT HIP WITHOUT AND WITH CONTRAST TECHNIQUE: Multiplanar, multisequence MR imaging was performed both before and after administration of intravenous contrast. CONTRAST:  96m GADAVIST GADOBUTROL 1 MMOL/ML IV SOLN COMPARISON:  CT abdomen pelvis from same day. Left hip x-rays from yesterday. MRI left hip dated December 26, 2012. FINDINGS: Bones: Prior bilateral total hip arthroplasties with associated susceptibility artifact. Focal marrow edema in the left medial acetabulum with corresponding  decreased T1 marrow signal (series 4 and 5, images 10-15). There is no evidence of acute fracture, dislocation or avascular necrosis. No focal bone lesion. The visualized sacroiliac joints and symphysis pubis appear normal. Joint or bursal effusion Joint effusion: Large left hip joint effusion extending into the left gluteus minimus muscle with prominent synovial thickening and enhancement. No right hip joint effusion. Bursae: Small amount of fluid in the left greater trochanteric bursa. Muscles and tendons Muscles and tendons: Prominent edema within the left gluteus minimus, left piriformis and, and left iliacus muscles. Inflammatory changes involve the greater sciatic foramen. Developing 3.1 cm abscess in the left iliacus muscle (series 17, image 21). The visualized gluteus, hamstring and iliopsoas tendons are intact. Partial tear of the right adductor magnus tendon near the ischial tuberosity with surrounding edema. Other findings Miscellaneous: The visualized internal pelvic contents appear unremarkable. IMPRESSION: 1. Left hip septic arthritis. 2. Marrow signal changes in the left medial acetabulum concerning for osteomyelitis. 3. Left gluteus minimus, left piriformis, and left iliacus  myositis with developing 3.1 cm abscess in the left iliacus muscle. 4. Mild left greater trochanteric bursitis. 5. Partial tear of the right adductor magnus tendon near the ischial tuberosity. Electronically Signed   By: Titus Dubin M.D.   On: 04/10/2020 11:26   CT ABDOMEN PELVIS W CONTRAST  Result Date: 04/10/2020 CLINICAL DATA:  Left hip and leg pain for 1 week, history of discitis/osteomyelitis EXAM: CT ABDOMEN AND PELVIS WITH CONTRAST TECHNIQUE: Multidetector CT imaging of the abdomen and pelvis was performed using the standard protocol following bolus administration of intravenous contrast. CONTRAST:  187m OMNIPAQUE IOHEXOL 300 MG/ML  SOLN COMPARISON:  07/19/2019, 07/24/2019, 11/09/2019 FINDINGS: Lower chest: No acute pleural or parenchymal lung disease. Hepatobiliary: No focal liver abnormality is seen. No gallstones, gallbladder wall thickening, or biliary dilatation. Pancreas: Unremarkable. No pancreatic ductal dilatation or surrounding inflammatory changes. Spleen: Normal in size without focal abnormality. Adrenals/Urinary Tract: Stable cyst inferolateral left kidney. Otherwise the kidneys enhance normally and symmetrically. No urinary tract calculi or obstruction. Bladder is grossly normal, with limited evaluation of the bladder base due to streak artifact from bilateral hip arthroplasties. Adrenals are unremarkable. Stomach/Bowel: No bowel obstruction or ileus. Normal appendix right lower quadrant. No bowel wall thickening or inflammatory change. Vascular/Lymphatic: Aortic atherosclerosis. Multiple borderline enlarged retroperitoneal and left iliac chain lymph nodes are seen. Largest lymph node on image 64/2 in the left external iliac chain measures up to 8 mm in size. Reproductive: Evaluation of the prostate is limited by streak artifact from bilateral hip arthroplasties. Other: No free fluid or free intraperitoneal gas. No abdominal wall hernia. Musculoskeletal: There are no acute displaced  fractures. Disc space narrowing and endplate sclerosis at LZ6/X0 likely sequela from previous infection. There is retroperitoneal fat stranding along the left iliac vessels, with asymmetric enlargement of the left iliacus and left gluteal musculature. Underlying infection cannot be excluded. No fluid collection or abscess. Reconstructed images demonstrate no additional findings. IMPRESSION: 1. Retroperitoneal fat stranding along the left iliac chain, with enlargement of the left iliacus and gluteus musculature. Findings are consistent with underlying inflammation or infection. No fluid collection or abscess at this time. 2. Disc space narrowing and endplate sclerosis at LR6/E4 consistent with sequela of previous infection. 3.  Aortic Atherosclerosis (ICD10-I70.0). Electronically Signed   By: MRanda NgoM.D.   On: 04/10/2020 01:05   DG Pelvis Portable  Result Date: 04/11/2020 CLINICAL DATA:  Status post hip replacement. EXAM: PORTABLE PELVIS 1-2 VIEWS COMPARISON:  Pelvis  CT, dated August 20, 2019 FINDINGS: Bilateral total hip replacements are seen. There is no evidence of surrounding lucency to suggest the presence of hardware loosening or infection. There is no evidence of pelvic fracture or diastasis. No pelvic bone lesions are seen. IMPRESSION: Bilateral total hip replacements without evidence of hardware loosening or infection. Electronically Signed   By: Virgina Norfolk M.D.   On: 04/11/2020 18:59   IR Fluoro Guide Ndl Plmt / BX  Result Date: 04/10/2020 INDICATION: 54 year old male with a history of bilateral hip replacements and prior lumbar osteomyelitis-discitis. He presents with left hip pain and complex left hip joint effusion concerning for septic arthritis. He presents for aspiration. EXAM: Left hip joint aspiration MEDICATIONS: The patient is currently admitted to the hospital and receiving intravenous antibiotics. The antibiotics were administered within an appropriate time frame prior to  the initiation of the procedure. ANESTHESIA/SEDATION: None. COMPLICATIONS: None immediate. PROCEDURE: Informed written consent was obtained from the patient after a thorough discussion of the procedural risks, benefits and alternatives. All questions were addressed. Maximal Sterile Barrier Technique was utilized including caps, mask, sterile gowns, sterile gloves, sterile drape, hand hygiene and skin antiseptic. A timeout was performed prior to the initiation of the procedure. Local anesthesia was attained by infiltration with 1% lidocaine. An 18 gauge trocar needle was carefully advanced through the soft tissues and into the joint space. Aspiration was then performed yielding approximately 60 mL of turbid yellow synovial fluid. Samples were sent for Gram stain, culture, cell count and crystals. IMPRESSION: Successful left hip joint aspiration yielding approximately 60 mL of turbid golden synovial fluid. Electronically Signed   By: Jacqulynn Cadet M.D.   On: 04/10/2020 16:24   ECHOCARDIOGRAM COMPLETE  Result Date: 04/13/2020    ECHOCARDIOGRAM REPORT   Patient Name:   [name] Date of Exam: 04/12/2020 Medical Rec #:  [id number]     Height:       72.0 in Accession #:    [id number]    Weight:       232.0 lb Date of Birth:  [birth year]     BSA:          2.269 m Patient Age:    49 years      BP:           107/52 mmHg Patient Gender: M             HR:           64 bpm. Exam Location:  Inpatient Procedure: 2D Echo, Cardiac Doppler and Color Doppler Indications:    Bacteremia  History:        Patient has prior history of Echocardiogram examinations, most                 recent 07/27/2019. MSSA bacteremia.  Sonographer:    Clayton Lefort RDCS (AE) Referring Phys: [id number] [facility]  Sonographer Comments: Limited patient mobility. IMPRESSIONS  1. Left ventricular ejection fraction, by estimation, is 60 to 65%. The left ventricle has normal function. The left ventricle has no regional wall motion abnormalities. There is  mild concentric left ventricular hypertrophy. Left ventricular diastolic parameters were normal.  2. Right ventricular systolic function is normal. The right ventricular size is normal. Tricuspid regurgitation signal is inadequate for assessing PA pressure.  3. The mitral valve is normal in structure. No evidence of mitral valve regurgitation. No evidence of mitral stenosis.  4. The aortic valve was not well visualized. Aortic valve regurgitation is not  visualized. No aortic stenosis is present.  5. Aortic dilatation noted. There is borderline dilatation of the aortic root, measuring 37 mm. There is mild dilatation of the ascending aorta, measuring 38 mm.  6. The inferior vena cava is normal in size with <50% respiratory variability, suggesting right atrial pressure of 8 mmHg. FINDINGS  Left Ventricle: Left ventricular ejection fraction, by estimation, is 60 to 65%. The left ventricle has normal function. The left ventricle has no regional wall motion abnormalities. The left ventricular internal cavity size was normal in size. There is  mild concentric left ventricular hypertrophy. Left ventricular diastolic parameters were normal. Normal left ventricular filling pressure. Right Ventricle: The right ventricular size is normal. No increase in right ventricular wall thickness. Right ventricular systolic function is normal. Tricuspid regurgitation signal is inadequate for assessing PA pressure. Left Atrium: Left atrial size was normal in size. Right Atrium: Right atrial size was normal in size. Pericardium: There is no evidence of pericardial effusion. Mitral Valve: The mitral valve is normal in structure. No evidence of mitral valve regurgitation. No evidence of mitral valve stenosis. Tricuspid Valve: The tricuspid valve is normal in structure. Tricuspid valve regurgitation is trivial. No evidence of tricuspid stenosis. Aortic Valve: The aortic valve was not well visualized. Aortic valve regurgitation is not  visualized. No aortic stenosis is present. Aortic valve mean gradient measures 3.0 mmHg. Aortic valve peak gradient measures 5.9 mmHg. Aortic valve area, by VTI measures 3.01 cm. Pulmonic Valve: The pulmonic valve was normal in structure. Pulmonic valve regurgitation is not visualized. No evidence of pulmonic stenosis. Aorta: Aortic dilatation noted. There is borderline dilatation of the aortic root, measuring 37 mm. There is mild dilatation of the ascending aorta, measuring 38 mm. Venous: The inferior vena cava is normal in size with less than 50% respiratory variability, suggesting right atrial pressure of 8 mmHg. IAS/Shunts: The interatrial septum appears to be lipomatous. No atrial level shunt detected by color flow Doppler.  LEFT VENTRICLE PLAX 2D LVIDd:         5.30 cm  Diastology LVIDs:         3.40 cm  LV e' medial:    9.14 cm/s LV PW:         1.80 cm  LV E/e' medial:  8.1 LV IVS:        1.50 cm  LV e' lateral:   11.70 cm/s LVOT diam:     2.40 cm  LV E/e' lateral: 6.3 LV SV:         77 LV SV Index:   34 LVOT Area:     4.52 cm  RIGHT VENTRICLE             IVC RV Basal diam:  3.00 cm     IVC diam: 2.00 cm RV S prime:     14.00 cm/s TAPSE (M-mode): 2.9 cm LEFT ATRIUM           Index       RIGHT ATRIUM           Index LA diam:      3.50 cm 1.54 cm/m  RA Area:     22.20 cm LA Vol (A2C): 29.7 ml 13.09 ml/m RA Volume:   69.70 ml  30.71 ml/m LA Vol (A4C): 66.7 ml 29.39 ml/m  AORTIC VALVE AV Area (Vmax):    3.27 cm AV Area (Vmean):   3.04 cm AV Area (VTI):     3.01 cm AV Vmax:  121.00 cm/s AV Vmean:          79.800 cm/s AV VTI:            0.257 m AV Peak Grad:      5.9 mmHg AV Mean Grad:      3.0 mmHg LVOT Vmax:         87.40 cm/s LVOT Vmean:        53.600 cm/s LVOT VTI:          0.171 m LVOT/AV VTI ratio: 0.67  AORTA Ao Root diam: 3.70 cm Ao Asc diam:  3.80 cm MITRAL VALVE MV Area (PHT): 2.20 cm    SHUNTS MV Decel Time: 345 msec    Systemic VTI:  0.17 m MV E velocity: 74.10 cm/s  Systemic Diam:  2.40 cm MV A velocity: 48.40 cm/s MV E/A ratio:  1.53 Fransico Him MD Electronically signed by Fransico Him MD Signature Date/Time: 04/13/2020/1:23:10 PM    Final    DG Hip Unilat With Pelvis 2-3 Views Left  Result Date: 04/09/2020 CLINICAL DATA:  Left hip pain 1 week EXAM: DG HIP (WITH OR WITHOUT PELVIS) 2-3V LEFT COMPARISON:  None. FINDINGS: The patient is status post left total hip arthroplasty. No periprosthetic lucency or fracture is identified. There is also a right total hip arthroplasty. Mild sclerosis around both bilateral sacroiliac joints. IMPRESSION: Left total hip arthroplasty without complication. Electronically Signed   By: Prudencio Pair M.D.   On: 04/09/2020 15:03   DG FEMUR 1V LEFT  Result Date: 04/09/2020 CLINICAL DATA:  Left hip pain EXAM: LEFT FEMUR 1 VIEW COMPARISON:  None. FINDINGS: Single view radiograph of the left femur demonstrates surgical changes of left total hip arthroplasty. Arthroplasty components overlie the expected position. Mild stress buttressing noted adjacent to the a distal aspect of the femoral stem component. No suspicious lytic or blastic bone lesions. No acute fracture or dislocation. Soft tissues are unremarkable. IMPRESSION: 1. Postsurgical changes of left total hip arthroplasty. No evidence of hardware complication. 2. No acute fracture or dislocation. Electronically Signed   By: Fidela Salisbury MD   On: 04/09/2020 15:05   [place] EKG SITE RITE  Result Date: 04/14/2020 If Site Rite image not attached, placement could not be confirmed due to current cardiac rhythm.      Subjective: Left leg pain. No other concerns.  Discharge Exam: Vitals:   04/14/20 2156 04/15/20 0531  BP: 113/70 105/65  Pulse: 79 62  Resp: 18 17  Temp: 98.9 F (37.2 C) 98.7 F (37.1 C)  SpO2: 99% 96%   Vitals:   04/13/20 2101 04/14/20 0525 04/14/20 2156 04/15/20 0531  BP: 99/72 (!) 106/59 113/70 105/65  Pulse: 70 (!) 53 79 62  Resp: '17 17 18 17  ' Temp: 98.3 F (36.8 C) 99.3  F (37.4 C) 98.9 F (37.2 C) 98.7 F (37.1 C)  TempSrc: Oral Oral Oral Oral  SpO2: 97% 98% 99% 96%  Weight:      Height:        General: Pt is alert, awake, not in acute distress Cardiovascular: RRR, S1/S2 +, no rubs, no gallops Respiratory: CTA bilaterally, no wheezing, no rhonchi Abdominal: Soft, NT, ND, bowel sounds + Extremities: no edema, no cyanosis    The results of significant diagnostics from this hospitalization (including imaging, microbiology, ancillary and laboratory) are listed below for reference.     Microbiology: Recent Results (from the past 240 hour(s))  Culture, blood (routine x 2)     Status: Abnormal  Collection Time: 04/09/20 10:35 PM   Specimen: BLOOD  Result Value Ref Range Status   Specimen Description   Final    BLOOD LEFT ANTECUBITAL Performed at [facility] [address] [place], [place] [address]    Special Requests   Final    BOTTLES DRAWN AEROBIC AND ANAEROBIC Blood Culture adequate volume Performed at [facility] [address] [place], [place] [address]    Culture  Setup Time   Final    GRAM POSITIVE COCCI IN CLUSTERS IN BOTH AEROBIC AND ANAEROBIC BOTTLES CRITICAL RESULT CALLED TO, READ BACK BY AND VERIFIED WITH: Ellin Mayhew [facility] 04/10/20 AT 1650 SK Performed at [facility], [place] [address], [place], [place] [address]    Culture STAPHYLOCOCCUS AUREUS (A)  Final   Report Status 04/12/2020 FINAL  Final   Organism ID, Bacteria STAPHYLOCOCCUS AUREUS  Final      Susceptibility   Staphylococcus aureus - MIC*    CIPROFLOXACIN >=8 RESISTANT Resistant     ERYTHROMYCIN >=8 RESISTANT Resistant     GENTAMICIN <=0.5 SENSITIVE Sensitive     OXACILLIN 0.5 SENSITIVE Sensitive     TETRACYCLINE <=1 SENSITIVE Sensitive     VANCOMYCIN 1 SENSITIVE Sensitive     TRIMETH/SULFA <=10 SENSITIVE Sensitive     CLINDAMYCIN <=0.25 SENSITIVE Sensitive     RIFAMPIN <=0.5 SENSITIVE Sensitive     Inducible  Clindamycin NEGATIVE Sensitive     * STAPHYLOCOCCUS AUREUS  Blood Culture ID Panel (Reflexed)     Status: Abnormal   Collection Time: 04/09/20 10:35 PM  Result Value Ref Range Status   Enterococcus faecalis NOT DETECTED NOT DETECTED Final   Enterococcus Faecium NOT DETECTED NOT DETECTED Final   Listeria monocytogenes NOT DETECTED NOT DETECTED Final   Staphylococcus species DETECTED (A) NOT DETECTED Final    Comment: CRITICAL RESULT CALLED TO, READ BACK BY AND VERIFIED WITH: PHARMD M MACCIA 04/10/20 AT 1650 SK    Staphylococcus aureus (BCID) DETECTED (A) NOT DETECTED Final    Comment: CRITICAL RESULT CALLED TO, READ BACK BY AND VERIFIED WITH: PHARMD M MACCIA 04/10/20 AT 1650 SK    Staphylococcus epidermidis NOT DETECTED NOT DETECTED Final   Staphylococcus lugdunensis NOT DETECTED NOT DETECTED Final   Streptococcus species NOT DETECTED NOT DETECTED Final   Streptococcus agalactiae NOT DETECTED NOT DETECTED Final   Streptococcus pneumoniae NOT DETECTED NOT DETECTED Final   Streptococcus pyogenes NOT DETECTED NOT DETECTED Final   A.calcoaceticus-baumannii NOT DETECTED NOT DETECTED Final   Bacteroides fragilis NOT DETECTED NOT DETECTED Final   Enterobacterales NOT DETECTED NOT DETECTED Final   Enterobacter cloacae complex NOT DETECTED NOT DETECTED Final   Escherichia coli NOT DETECTED NOT DETECTED Final   Klebsiella aerogenes NOT DETECTED NOT DETECTED Final   Klebsiella oxytoca NOT DETECTED NOT DETECTED Final   Klebsiella pneumoniae NOT DETECTED NOT DETECTED Final   Proteus species NOT DETECTED NOT DETECTED Final   Salmonella species NOT DETECTED NOT DETECTED Final   Serratia marcescens NOT DETECTED NOT DETECTED Final   Haemophilus influenzae NOT DETECTED NOT DETECTED Final   Neisseria meningitidis NOT DETECTED NOT DETECTED Final   Pseudomonas aeruginosa NOT DETECTED NOT DETECTED Final   Stenotrophomonas maltophilia NOT DETECTED NOT DETECTED Final   Candida albicans NOT DETECTED NOT  DETECTED Final   Candida auris NOT DETECTED NOT DETECTED Final   Candida glabrata NOT DETECTED NOT DETECTED Final   Candida krusei NOT DETECTED NOT DETECTED Final   Candida parapsilosis NOT DETECTED NOT  DETECTED Final   Candida tropicalis NOT DETECTED NOT DETECTED Final   Cryptococcus neoformans/gattii NOT DETECTED NOT DETECTED Final   Meth resistant mecA/C and MREJ NOT DETECTED NOT DETECTED Final    Comment: Performed at [facility], [place] [address], [place], [place] [address]  SARS Coronavirus 2 by RT PCR (hospital order, performed in [facility] hospital lab) Nasopharyngeal Nasopharyngeal Swab     Status: None   Collection Time: 04/10/20  1:42 AM   Specimen: Nasopharyngeal Swab  Result Value Ref Range Status   SARS Coronavirus 2 NEGATIVE NEGATIVE Final    Comment: (NOTE) SARS-CoV-2 target nucleic acids are NOT DETECTED.  The SARS-CoV-2 RNA is generally detectable in upper and lower respiratory specimens during the acute phase of infection. The lowest concentration of SARS-CoV-2 viral copies this assay can detect is 250 copies / mL. A negative result does not preclude SARS-CoV-2 infection and should not be used as the sole basis for treatment or other patient management decisions.  A negative result may occur with improper specimen collection / handling, submission of specimen other than nasopharyngeal swab, presence of viral mutation(s) within the areas targeted by this assay, and inadequate number of viral copies (<250 copies / mL). A negative result must be combined with clinical observations, patient history, and epidemiological information.  Fact Sheet for Patients:   StrictlyIdeas.no  Fact Sheet for Healthcare Providers: BankingDealers.co.za  This test is not yet approved or  cleared by the [place] FDA and has been authorized for detection and/or diagnosis of SARS-CoV-2 by FDA under an Emergency Use Authorization  (EUA).  This EUA will remain in effect (meaning this test can be used) for the duration of the COVID-19 declaration under Section 564(b)(1) of the Act, 21 U.S.C. section 360bbb-3(b)(1), unless the authorization is terminated or revoked sooner.  Performed at [facility], [place] [address] [place], [place] [address]   Urine culture     Status: Abnormal   Collection Time: 04/10/20  3:00 AM   Specimen: Urine, Clean Catch  Result Value Ref Range Status   Specimen Description   Final    URINE, CLEAN CATCH Performed at [facility], [place] [address], [place], [place] [address]    Special Requests   Final    NONE Performed at [facility], [place] [address], [place], [place] [address]    Culture >=100,000 COLONIES/mL STAPHYLOCOCCUS AUREUS (A)  Final   Report Status 04/12/2020 FINAL  Final   Organism ID, Bacteria STAPHYLOCOCCUS AUREUS (A)  Final      Susceptibility   Staphylococcus aureus - MIC*    CIPROFLOXACIN >=8 RESISTANT Resistant     GENTAMICIN <=0.5 SENSITIVE Sensitive     NITROFURANTOIN <=16 SENSITIVE Sensitive     OXACILLIN 0.5 SENSITIVE Sensitive     TETRACYCLINE <=1 SENSITIVE Sensitive     VANCOMYCIN <=0.5 SENSITIVE Sensitive     TRIMETH/SULFA <=10 SENSITIVE Sensitive     CLINDAMYCIN <=0.25 SENSITIVE Sensitive     RIFAMPIN <=0.5 SENSITIVE Sensitive     Inducible Clindamycin NEGATIVE Sensitive     * >=100,000 COLONIES/mL STAPHYLOCOCCUS AUREUS  Body fluid culture     Status: None   Collection Time: 04/10/20  4:05 PM   Specimen: Joint, Left Hip; Synovial Fluid  Result Value Ref Range Status   Specimen Description SYNOVIAL LEFT HIP  Final   Special Requests NONE  Final   Gram Stain   Final    ABUNDANT WBC PRESENT,BOTH PMN AND MONONUCLEAR  NO ORGANISMS SEEN    Culture   Final    NO GROWTH 3 DAYS Performed at [facility], [place] [address], [place], [place] [address]    Report Status 04/14/2020 FINAL  Final    Surgical PCR screen     Status: Abnormal   Collection Time: 04/10/20 11:00 PM   Specimen: Nasal Mucosa; Nasal Swab  Result Value Ref Range Status   MRSA, PCR POSITIVE (A) NEGATIVE Final    Comment: RESULT CALLED TO, READ BACK BY AND VERIFIED WITH: Clydene Laming RN 04/11/20 0123 JDW    Staphylococcus aureus POSITIVE (A) NEGATIVE Final    Comment: (NOTE) The Xpert SA Assay (FDA approved for NASAL specimens in patients 44 years of age and older), is one component of a comprehensive surveillance program. It is not intended to diagnose infection nor to guide or monitor treatment. Performed at [facility], [place] [address], [place], [place] [address]   Aerobic/Anaerobic Culture (surgical/deep wound)     Status: None (Preliminary result)   Collection Time: 04/11/20  3:09 PM   Specimen: PATH Other; Body Fluid  Result Value Ref Range Status   Specimen Description FLUID LEFT HIP  Final   Special Requests SWAB NO 1 PT ON ANCEF  Final   Gram Stain   Final    RARE WBC PRESENT,BOTH PMN AND MONONUCLEAR NO ORGANISMS SEEN    Culture   Final    NO GROWTH 4 DAYS NO ANAEROBES ISOLATED; CULTURE IN PROGRESS FOR 5 DAYS Performed at [facility], [address], [place], [place] [address]    Report Status PENDING  Incomplete  Aerobic/Anaerobic Culture (surgical/deep wound)     Status: None (Preliminary result)   Collection Time: 04/11/20  3:11 PM   Specimen: PATH Other; Body Fluid  Result Value Ref Range Status   Specimen Description FLUID LEFT HIP  Final   Special Requests SWAB NO 2 PT ON ANCEF  Final   Gram Stain   Final    RARE WBC PRESENT,BOTH PMN AND MONONUCLEAR NO ORGANISMS SEEN    Culture   Final    NO GROWTH 4 DAYS NO ANAEROBES ISOLATED; CULTURE IN PROGRESS FOR 5 DAYS Performed at [facility], [address], [place], [place] [address]    Report Status PENDING  Incomplete  Aerobic/Anaerobic Culture (surgical/deep wound)     Status: None (Preliminary result)   Collection  Time: 04/11/20  3:24 PM   Specimen: PATH Other; Tissue  Result Value Ref Range Status   Specimen Description TISSUE LEFT HIP  Final   Special Requests FROM HIP JOINT,PT ON ANCEF  Final   Gram Stain   Final    FEW WBC PRESENT, PREDOMINANTLY MONONUCLEAR NO ORGANISMS SEEN    Culture   Final    NO GROWTH 4 DAYS NO ANAEROBES ISOLATED; CULTURE IN PROGRESS FOR 5 DAYS Performed at [facility], [place] [address], [place], [place] [address]    Report Status PENDING  Incomplete  Culture, blood (routine x 2)     Status: None (Preliminary result)   Collection Time: 04/12/20 10:22 AM   Specimen: BLOOD RIGHT HAND  Result Value Ref Range Status   Specimen Description BLOOD RIGHT HAND  Final   Special Requests   Final    BOTTLES DRAWN AEROBIC AND ANAEROBIC Blood Culture adequate volume   Culture   Final    NO GROWTH 2 DAYS Performed at [facility], [place] [address]  [place], [place] [address]    Report Status PENDING  Incomplete  Culture, blood (routine x 2)     Status: None (Preliminary result)   Collection Time: 04/12/20 10:27 AM   Specimen: BLOOD RIGHT HAND  Result Value Ref Range Status   Specimen Description BLOOD RIGHT HAND  Final   Special Requests   Final    BOTTLES DRAWN AEROBIC ONLY Blood Culture adequate volume   Culture   Final    NO GROWTH 2 DAYS Performed at [facility], [address] [place], [place] [address]    Report Status PENDING  Incomplete     Labs: BNP (last 3 results) No results for input(s): BNP in the last 8760 hours. Basic Metabolic Panel: Recent Labs  Lab 04/09/20 2235 04/10/20 1420 04/11/20 0351 04/12/20 0348 04/14/20 0058  NA 132* 136 132* 135 137  K 4.0 4.1 4.2 4.7 3.9  CL 95* 101 102 104 104  CO2 '24 23 22 22 23  ' GLUCOSE 102* 97 104* 135* 104*  BUN 26* 26* 25* 20 15  CREATININE 1.37* 1.26* 1.36* 0.99 1.02  CALCIUM 9.3 9.3 8.6* 8.8* 8.8*   Liver Function Tests: Recent Labs  Lab 04/09/20 2235 04/11/20 0351  AST 39 48*    ALT 66* 51*  ALKPHOS 163* 156*  BILITOT 2.1* 1.0  PROT 9.4* 7.0  ALBUMIN 3.3* 2.2*   No results for input(s): LIPASE, AMYLASE in the last 168 hours. No results for input(s): AMMONIA in the last 168 hours. CBC: Recent Labs  Lab 04/09/20 2235 04/10/20 1420 04/11/20 0351 04/12/20 0348 04/14/20 0058  WBC 13.6* 10.7* 10.1 10.9* 7.2  NEUTROABS 11.1* 8.9*  --   --   --   HGB 14.1 13.0 11.5* 11.3* 10.5*  HCT 43.4 39.9 35.3* 36.0* 33.8*  MCV 95.8 95.9 97.0 95.7 96.6  PLT 380 337 319 346 400   Cardiac Enzymes: No results for input(s): CKTOTAL, CKMB, CKMBINDEX, TROPONINI in the last 168 hours. BNP: Invalid input(s): POCBNP CBG: No results for input(s): GLUCAP in the last 168 hours. D-Dimer No results for input(s): DDIMER in the last 72 hours. Hgb A1c No results for input(s): HGBA1C in the last 72 hours. Lipid Profile No results for input(s): CHOL, HDL, LDLCALC, TRIG, CHOLHDL, LDLDIRECT in the last 72 hours. Thyroid function studies No results for input(s): TSH, T4TOTAL, T3FREE, THYROIDAB in the last 72 hours.  Invalid input(s): FREET3 Anemia work up No results for input(s): VITAMINB12, FOLATE, FERRITIN, TIBC, IRON, RETICCTPCT in the last 72 hours. Urinalysis    Component Value Date/Time   COLORURINE AMBER (A) 04/10/2020 0300   APPEARANCEUR HAZY (A) 04/10/2020 0300   LABSPEC 1.040 (H) 04/10/2020 0300   PHURINE 5.0 04/10/2020 0300   GLUCOSEU NEGATIVE 04/10/2020 0300   HGBUR LARGE (A) 04/10/2020 0300   BILIRUBINUR NEGATIVE 04/10/2020 0300   KETONESUR NEGATIVE 04/10/2020 0300   PROTEINUR 100 (A) 04/10/2020 0300   UROBILINOGEN 0.2 01/09/2013 1046   NITRITE NEGATIVE 04/10/2020 0300   LEUKOCYTESUR NEGATIVE 04/10/2020 0300   Sepsis Labs Invalid input(s): PROCALCITONIN,  WBC,  LACTICIDVEN Microbiology Recent Results (from the past 240 hour(s))  Culture, blood (routine x 2)     Status: Abnormal   Collection Time: 04/09/20 10:35 PM   Specimen: BLOOD  Result Value Ref Range  Status   Specimen Description   Final    BLOOD LEFT ANTECUBITAL Performed at [facility], [place] [address], [place], [place] [address]    Special Requests   Final  BOTTLES DRAWN AEROBIC AND ANAEROBIC Blood Culture adequate volume Performed at [place] [address] [place], [place] [address]    Culture  Setup Time   Final    GRAM POSITIVE COCCI IN CLUSTERS IN BOTH AEROBIC AND ANAEROBIC BOTTLES CRITICAL RESULT CALLED TO, READ BACK BY AND VERIFIED WITH: Ellin Mayhew [facility] 04/10/20 AT 1650 SK Performed at [facility], [place] [address], [place], [place] [address]    Culture STAPHYLOCOCCUS AUREUS (A)  Final   Report Status 04/12/2020 FINAL  Final   Organism ID, Bacteria STAPHYLOCOCCUS AUREUS  Final      Susceptibility   Staphylococcus aureus - MIC*    CIPROFLOXACIN >=8 RESISTANT Resistant     ERYTHROMYCIN >=8 RESISTANT Resistant     GENTAMICIN <=0.5 SENSITIVE Sensitive     OXACILLIN 0.5 SENSITIVE Sensitive     TETRACYCLINE <=1 SENSITIVE Sensitive     VANCOMYCIN 1 SENSITIVE Sensitive     TRIMETH/SULFA <=10 SENSITIVE Sensitive     CLINDAMYCIN <=0.25 SENSITIVE Sensitive     RIFAMPIN <=0.5 SENSITIVE Sensitive     Inducible Clindamycin NEGATIVE Sensitive     * STAPHYLOCOCCUS AUREUS  Blood Culture ID Panel (Reflexed)     Status: Abnormal   Collection Time: 04/09/20 10:35 PM  Result Value Ref Range Status   Enterococcus faecalis NOT DETECTED NOT DETECTED Final   Enterococcus Faecium NOT DETECTED NOT DETECTED Final   Listeria monocytogenes NOT DETECTED NOT DETECTED Final   Staphylococcus species DETECTED (A) NOT DETECTED Final    Comment: CRITICAL RESULT CALLED TO, READ BACK BY AND VERIFIED WITH: PHARMD M MACCIA 04/10/20 AT 1650 SK    Staphylococcus aureus (BCID) DETECTED (A) NOT DETECTED Final    Comment: CRITICAL RESULT CALLED TO, READ BACK BY AND VERIFIED WITH: PHARMD M [place] 04/10/20 AT 1650 SK    Staphylococcus epidermidis NOT  DETECTED NOT DETECTED Final   Staphylococcus lugdunensis NOT DETECTED NOT DETECTED Final   Streptococcus species NOT DETECTED NOT DETECTED Final   Streptococcus agalactiae NOT DETECTED NOT DETECTED Final   Streptococcus pneumoniae NOT DETECTED NOT DETECTED Final   Streptococcus pyogenes NOT DETECTED NOT DETECTED Final   A.calcoaceticus-baumannii NOT DETECTED NOT DETECTED Final   Bacteroides fragilis NOT DETECTED NOT DETECTED Final   Enterobacterales NOT DETECTED NOT DETECTED Final   Enterobacter cloacae complex NOT DETECTED NOT DETECTED Final   Escherichia coli NOT DETECTED NOT DETECTED Final   Klebsiella aerogenes NOT DETECTED NOT DETECTED Final   Klebsiella oxytoca NOT DETECTED NOT DETECTED Final   Klebsiella pneumoniae NOT DETECTED NOT DETECTED Final   Proteus species NOT DETECTED NOT DETECTED Final   Salmonella species NOT DETECTED NOT DETECTED Final   Serratia marcescens NOT DETECTED NOT DETECTED Final   Haemophilus influenzae NOT DETECTED NOT DETECTED Final   Neisseria meningitidis NOT DETECTED NOT DETECTED Final   Pseudomonas aeruginosa NOT DETECTED NOT DETECTED Final   Stenotrophomonas maltophilia NOT DETECTED NOT DETECTED Final   Candida albicans NOT DETECTED NOT DETECTED Final   Candida auris NOT DETECTED NOT DETECTED Final   Candida glabrata NOT DETECTED NOT DETECTED Final   Candida krusei NOT DETECTED NOT DETECTED Final   Candida parapsilosis NOT DETECTED NOT DETECTED Final   Candida tropicalis NOT DETECTED NOT DETECTED Final   Cryptococcus neoformans/gattii NOT DETECTED NOT DETECTED Final   Meth resistant mecA/C and MREJ NOT DETECTED NOT DETECTED Final    Comment: Performed at [facility], [address], [place], [place] [address]  SARS Coronavirus 2  by RT PCR (hospital order, performed in [facility] hospital lab) Nasopharyngeal Nasopharyngeal Swab     Status: None   Collection Time: 04/10/20  1:42 AM   Specimen: Nasopharyngeal Swab  Result Value Ref Range  Status   SARS Coronavirus 2 NEGATIVE NEGATIVE Final    Comment: (NOTE) SARS-CoV-2 target nucleic acids are NOT DETECTED.  The SARS-CoV-2 RNA is generally detectable in upper and lower respiratory specimens during the acute phase of infection. The lowest concentration of SARS-CoV-2 viral copies this assay can detect is 250 copies / mL. A negative result does not preclude SARS-CoV-2 infection and should not be used as the sole basis for treatment or other patient management decisions.  A negative result may occur with improper specimen collection / handling, submission of specimen other than nasopharyngeal swab, presence of viral mutation(s) within the areas targeted by this assay, and inadequate number of viral copies (<250 copies / mL). A negative result must be combined with clinical observations, patient history, and epidemiological information.  Fact Sheet for Patients:   StrictlyIdeas.no  Fact Sheet for Healthcare Providers: BankingDealers.co.za  This test is not yet approved or  cleared by the [place] FDA and has been authorized for detection and/or diagnosis of SARS-CoV-2 by FDA under an Emergency Use Authorization (EUA).  This EUA will remain in effect (meaning this test can be used) for the duration of the COVID-19 declaration under Section 564(b)(1) of the Act, 21 U.S.C. section 360bbb-3(b)(1), unless the authorization is terminated or revoked sooner.  Performed at [facility], [place] [address], [place], [place] [address]   Urine culture     Status: Abnormal   Collection Time: 04/10/20  3:00 AM   Specimen: Urine, Clean Catch  Result Value Ref Range Status   Specimen Description   Final    URINE, CLEAN CATCH Performed at [facility], [place] [address] [place], [place] [address]    Special Requests   Final    NONE Performed at [facility], [place] [address] [place], [place] [address]    Culture >=100,000 COLONIES/mL STAPHYLOCOCCUS AUREUS (A)  Final   Report Status 04/12/2020 FINAL  Final   Organism ID, Bacteria STAPHYLOCOCCUS AUREUS (A)  Final      Susceptibility   Staphylococcus aureus - MIC*    CIPROFLOXACIN >=8 RESISTANT Resistant     GENTAMICIN <=0.5 SENSITIVE Sensitive     NITROFURANTOIN <=16 SENSITIVE Sensitive     OXACILLIN 0.5 SENSITIVE Sensitive     TETRACYCLINE <=1 SENSITIVE Sensitive     VANCOMYCIN <=0.5 SENSITIVE Sensitive     TRIMETH/SULFA <=10 SENSITIVE Sensitive     CLINDAMYCIN <=0.25 SENSITIVE Sensitive     RIFAMPIN <=0.5 SENSITIVE Sensitive     Inducible Clindamycin NEGATIVE Sensitive     * >=100,000 COLONIES/mL STAPHYLOCOCCUS AUREUS  Body fluid culture     Status: None   Collection Time: 04/10/20  4:05 PM   Specimen: Joint, Left Hip; Synovial Fluid  Result Value Ref Range Status   Specimen Description SYNOVIAL LEFT HIP  Final   Special Requests NONE  Final   Gram Stain   Final    ABUNDANT WBC PRESENT,BOTH PMN AND MONONUCLEAR NO ORGANISMS SEEN    Culture   Final    NO GROWTH 3 DAYS Performed at [facility], [address], [place], [place] [address]    Report Status 04/14/2020 FINAL  Final  Surgical PCR screen     Status: Abnormal   Collection  Time: 04/10/20 11:00 PM   Specimen: Nasal Mucosa; Nasal Swab  Result Value Ref Range Status   MRSA, PCR POSITIVE (A) NEGATIVE Final    Comment: RESULT CALLED TO, READ BACK BY AND VERIFIED WITH: Clydene Laming RN 04/11/20 0123 JDW    Staphylococcus aureus POSITIVE (A) NEGATIVE Final    Comment: (NOTE) The Xpert SA Assay (FDA approved for NASAL specimens in patients 29 years of age and older), is one component of a comprehensive surveillance program. It is not intended to diagnose infection nor to guide or monitor treatment. Performed at [facility], [place] [address] [place], [place] [address]   Aerobic/Anaerobic Culture (surgical/deep wound)     Status: None  (Preliminary result)   Collection Time: 04/11/20  3:09 PM   Specimen: PATH Other; Body Fluid  Result Value Ref Range Status   Specimen Description FLUID LEFT HIP  Final   Special Requests SWAB NO 1 PT ON ANCEF  Final   Gram Stain   Final    RARE WBC PRESENT,BOTH PMN AND MONONUCLEAR NO ORGANISMS SEEN    Culture   Final    NO GROWTH 4 DAYS NO ANAEROBES ISOLATED; CULTURE IN PROGRESS FOR 5 DAYS Performed at [facility], [address] [place], [place] [address]    Report Status PENDING  Incomplete  Aerobic/Anaerobic Culture (surgical/deep wound)     Status: None (Preliminary result)   Collection Time: 04/11/20  3:11 PM   Specimen: PATH Other; Body Fluid  Result Value Ref Range Status   Specimen Description FLUID LEFT HIP  Final   Special Requests SWAB NO 2 PT ON ANCEF  Final   Gram Stain   Final    RARE WBC PRESENT,BOTH PMN AND MONONUCLEAR NO ORGANISMS SEEN    Culture   Final    NO GROWTH 4 DAYS NO ANAEROBES ISOLATED; CULTURE IN PROGRESS FOR 5 DAYS Performed at [facility], [address] [place], [place] [address]    Report Status PENDING  Incomplete  Aerobic/Anaerobic Culture (surgical/deep wound)     Status: None (Preliminary result)   Collection Time: 04/11/20  3:24 PM   Specimen: PATH Other; Tissue  Result Value Ref Range Status   Specimen Description TISSUE LEFT HIP  Final   Special Requests FROM HIP JOINT,PT ON ANCEF  Final   Gram Stain   Final    FEW WBC PRESENT, PREDOMINANTLY MONONUCLEAR NO ORGANISMS SEEN    Culture   Final    NO GROWTH 4 DAYS NO ANAEROBES ISOLATED; CULTURE IN PROGRESS FOR 5 DAYS Performed at [facility], [place] [address], [place], [place] [address]    Report Status PENDING  Incomplete  Culture, blood (routine x 2)     Status: None (Preliminary result)   Collection Time: 04/12/20 10:22 AM   Specimen: BLOOD RIGHT HAND  Result Value Ref Range Status   Specimen Description BLOOD RIGHT HAND  Final   Special Requests   Final     BOTTLES DRAWN AEROBIC AND ANAEROBIC Blood Culture adequate volume   Culture   Final    NO GROWTH 2 DAYS Performed at [facility], [place] [address], [place], [place] [address]    Report Status PENDING  Incomplete  Culture, blood (routine x 2)     Status: None (Preliminary result)   Collection Time: 04/12/20 10:27 AM   Specimen: BLOOD RIGHT HAND  Result Value Ref Range Status   Specimen Description BLOOD RIGHT HAND  Final  Special Requests   Final    BOTTLES DRAWN AEROBIC ONLY Blood Culture adequate volume   Culture   Final    NO GROWTH 2 DAYS Performed at [facility], [place] [address], [place], [place] [address]    Report Status PENDING  Incomplete     Time coordinating discharge: 35 minutes  SIGNED:   Cordelia Poche, MD [facility] 04/15/2020, 10:58 AM

## 2020-04-15 NOTE — Progress Notes (Signed)
Patient ID: [name], male   DOB: 10/27/1965, 54 y.o.   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  I plan on 6 weeks of IV cefazolin plus oral rifampin prior to starting term suppressive oral cephalexin with rifampin.  PLAN: 1. Continue cefazolin and rifampin 2. I have arranged follow-up in my clinic and I will sign off now  Diagnosis: Bacteremia and left prosthetic hip infection  Culture Result: MSSA  Allergies  Allergen Reactions  . Bee Venom Anaphylaxis  . Hydrocodone Nausea And Vomiting    (12/10/19 - pt says he has taken without issues)    OPAT Orders Discharge antibiotics to be given via PICC line Discharge antibiotics: Per pharmacy protocol cefazolin  Duration: 6 weeks End Date: 05/20/2020  [facility] Care Per Protocol:  Home health RN for IV administration and teaching; PICC line care and labs.    Labs weekly while on IV antibiotics: _x_ CBC with differential _x_ BMP __ CMP _x_ CRP _x_ ESR __ Vancomycin trough __ CK  _x_ Please pull PIC at completion of IV antibiotics __ Please leave PIC in place until doctor has seen patient or been notified  Fax weekly labs to [phone]  Clinic Follow Up Appt: 05/20/2020  Principal Problem:   Infection of left prosthetic hip joint ([place]) Active Problems:   H/O MSSA epidural abscess, L2-L5   MSSA bacteremia   Acute low back pain   Scheduled Meds: . Chlorhexidine Gluconate Cloth  6 each Topical Daily  . enoxaparin (LOVENOX) injection  40 mg Subcutaneous Q24H  . multivitamin with minerals  1 tablet Oral Daily  . mupirocin ointment  1 application Nasal BID  . oxyCODONE  10 mg Oral Q12H  . polyethylene glycol  17 g Oral BID  . Ensure Max Protein  11 oz Oral BID  . rifampin  600 mg Oral Daily  .  senna-docusate  1 tablet Oral BID   Continuous Infusions: . sodium chloride 250 mL (04/15/20 1022)  .  ceFAZolin (ANCEF) IV 2 g (04/15/20 1023)   PRN Meds:.sodium chloride, acetaminophen, alum & mag hydroxide-simeth, cyclobenzaprine, diphenhydrAMINE, HYDROmorphone (DILAUDID) injection, magnesium citrate, menthol-cetylpyridinium **OR** phenol, metoCLOPramide **OR** metoCLOPramide (REGLAN) injection, ondansetron **OR** ondansetron (ZOFRAN) IV, oxyCODONE, oxyCODONE, pantoprazole, sodium chloride flush, sorbitol   SUBJECTIVE: He is feeling better.  Review of Systems: Review of Systems  Constitutional: Negative for chills, diaphoresis and fever.  Gastrointestinal: Negative for abdominal pain, diarrhea, nausea and vomiting.  Musculoskeletal: Positive for joint pain.    Allergies  Allergen Reactions  . Bee Venom Anaphylaxis  . Hydrocodone Nausea And Vomiting    (12/10/19 - pt says he has taken without issues)    OBJECTIVE: Vitals:   04/13/20 2101 04/14/20 0525 04/14/20 2156 04/15/20 0531  BP: 99/72 (!) 106/59 113/70 105/65  Pulse: 70 (!) 53 79 62  Resp: '17 17 18 17  ' Temp: 98.3 F (36.8 C) 99.3 F (37.4 C) 98.9 F (37.2 C) 98.7 F (37.1 C)  TempSrc: Oral Oral Oral Oral  SpO2: 97% 98% 99% 96%  Weight:      Height:       Body mass index is 31.46 kg/m.  Physical Exam Cardiovascular:     Rate and Rhythm: Normal rate and regular rhythm.  Heart sounds: No murmur heard.   Pulmonary:     Effort: Pulmonary effort is normal.     Breath sounds: Normal breath sounds.  Musculoskeletal:     Comments: He has a clean dry surgical dressing over his left hip incision.  Psychiatric:        Mood and Affect: Mood normal.     Lab Results Lab Results  Component Value Date   WBC 7.2 04/14/2020   HGB 10.5 (L) 04/14/2020   HCT 33.8 (L) 04/14/2020   MCV 96.6 04/14/2020   PLT 400 04/14/2020    Lab Results  Component Value Date   CREATININE 1.02 04/14/2020   BUN 15 04/14/2020    NA 137 04/14/2020   K 3.9 04/14/2020   CL 104 04/14/2020   CO2 23 04/14/2020    Lab Results  Component Value Date   ALT 51 (H) 04/11/2020   AST 48 (H) 04/11/2020   ALKPHOS 156 (H) 04/11/2020   BILITOT 1.0 04/11/2020     Microbiology: Recent Results (from the past 240 hour(s))  Culture, blood (routine x 2)     Status: Abnormal   Collection Time: 04/09/20 10:35 PM   Specimen: BLOOD  Result Value Ref Range Status   Specimen Description   Final    BLOOD LEFT ANTECUBITAL Performed at [facility], [place] [address] [place], [place] [address]    Special Requests   Final    BOTTLES DRAWN AEROBIC AND ANAEROBIC Blood Culture adequate volume Performed at [place] [address], [place], [place] [address]    Culture  Setup Time   Final    GRAM POSITIVE COCCI IN CLUSTERS IN BOTH AEROBIC AND ANAEROBIC BOTTLES CRITICAL RESULT CALLED TO, READ BACK BY AND VERIFIED WITH: Ellin Mayhew [facility] 04/10/20 AT 1650 SK Performed at [facility], [place] [address], [place], [place] [address]    Culture STAPHYLOCOCCUS AUREUS (A)  Final   Report Status 04/12/2020 FINAL  Final   Organism ID, Bacteria STAPHYLOCOCCUS AUREUS  Final      Susceptibility   Staphylococcus aureus - MIC*    CIPROFLOXACIN >=8 RESISTANT Resistant     ERYTHROMYCIN >=8 RESISTANT Resistant     GENTAMICIN <=0.5 SENSITIVE Sensitive     OXACILLIN 0.5 SENSITIVE Sensitive     TETRACYCLINE <=1 SENSITIVE Sensitive     VANCOMYCIN 1 SENSITIVE Sensitive     TRIMETH/SULFA <=10 SENSITIVE Sensitive     CLINDAMYCIN <=0.25 SENSITIVE Sensitive     RIFAMPIN <=0.5 SENSITIVE Sensitive     Inducible Clindamycin NEGATIVE Sensitive     * STAPHYLOCOCCUS AUREUS  Blood Culture ID Panel (Reflexed)     Status: Abnormal   Collection Time: 04/09/20 10:35 PM  Result Value Ref Range Status   Enterococcus faecalis NOT DETECTED NOT DETECTED Final   Enterococcus Faecium NOT DETECTED NOT DETECTED Final   Listeria  monocytogenes NOT DETECTED NOT DETECTED Final   Staphylococcus species DETECTED (A) NOT DETECTED Final    Comment: CRITICAL RESULT CALLED TO, READ BACK BY AND VERIFIED WITH: PHARMD M MACCIA 04/10/20 AT 1650 SK    Staphylococcus aureus (BCID) DETECTED (A) NOT DETECTED Final    Comment: CRITICAL RESULT CALLED TO, READ BACK BY AND VERIFIED WITH: PHARMD M MACCIA 04/10/20 AT 1650 SK    Staphylococcus epidermidis NOT DETECTED NOT DETECTED Final   Staphylococcus lugdunensis NOT DETECTED NOT DETECTED Final   Streptococcus species NOT DETECTED NOT DETECTED Final   Streptococcus agalactiae NOT DETECTED NOT DETECTED Final  Streptococcus pneumoniae NOT DETECTED NOT DETECTED Final   Streptococcus pyogenes NOT DETECTED NOT DETECTED Final   A.calcoaceticus-baumannii NOT DETECTED NOT DETECTED Final   Bacteroides fragilis NOT DETECTED NOT DETECTED Final   Enterobacterales NOT DETECTED NOT DETECTED Final   Enterobacter cloacae complex NOT DETECTED NOT DETECTED Final   Escherichia coli NOT DETECTED NOT DETECTED Final   Klebsiella aerogenes NOT DETECTED NOT DETECTED Final   Klebsiella oxytoca NOT DETECTED NOT DETECTED Final   Klebsiella pneumoniae NOT DETECTED NOT DETECTED Final   Proteus species NOT DETECTED NOT DETECTED Final   Salmonella species NOT DETECTED NOT DETECTED Final   Serratia marcescens NOT DETECTED NOT DETECTED Final   Haemophilus influenzae NOT DETECTED NOT DETECTED Final   Neisseria meningitidis NOT DETECTED NOT DETECTED Final   Pseudomonas aeruginosa NOT DETECTED NOT DETECTED Final   Stenotrophomonas maltophilia NOT DETECTED NOT DETECTED Final   Candida albicans NOT DETECTED NOT DETECTED Final   Candida auris NOT DETECTED NOT DETECTED Final   Candida glabrata NOT DETECTED NOT DETECTED Final   Candida krusei NOT DETECTED NOT DETECTED Final   Candida parapsilosis NOT DETECTED NOT DETECTED Final   Candida tropicalis NOT DETECTED NOT DETECTED Final   Cryptococcus neoformans/gattii NOT  DETECTED NOT DETECTED Final   Meth resistant mecA/C and MREJ NOT DETECTED NOT DETECTED Final    Comment: Performed at [facility], [address] [place], [place] [address]  SARS Coronavirus 2 by RT PCR (hospital order, performed in [facility] hospital lab) Nasopharyngeal Nasopharyngeal Swab     Status: None   Collection Time: 04/10/20  1:42 AM   Specimen: Nasopharyngeal Swab  Result Value Ref Range Status   SARS Coronavirus 2 NEGATIVE NEGATIVE Final    Comment: (NOTE) SARS-CoV-2 target nucleic acids are NOT DETECTED.  The SARS-CoV-2 RNA is generally detectable in upper and lower respiratory specimens during the acute phase of infection. The lowest concentration of SARS-CoV-2 viral copies this assay can detect is 250 copies / mL. A negative result does not preclude SARS-CoV-2 infection and should not be used as the sole basis for treatment or other patient management decisions.  A negative result may occur with improper specimen collection / handling, submission of specimen other than nasopharyngeal swab, presence of viral mutation(s) within the areas targeted by this assay, and inadequate number of viral copies (<250 copies / mL). A negative result must be combined with clinical observations, patient history, and epidemiological information.  Fact Sheet for Patients:   StrictlyIdeas.no  Fact Sheet for Healthcare Providers: BankingDealers.co.za  This test is not yet approved or  cleared by the [place] FDA and has been authorized for detection and/or diagnosis of SARS-CoV-2 by FDA under an Emergency Use Authorization (EUA).  This EUA will remain in effect (meaning this test can be used) for the duration of the COVID-19 declaration under Section 564(b)(1) of the Act, 21 U.S.C. section 360bbb-3(b)(1), unless the authorization is terminated or revoked sooner.  Performed at [facility], [place] [address], [place], [place] [address]   Urine culture     Status: Abnormal   Collection Time: 04/10/20  3:00 AM   Specimen: Urine, Clean Catch  Result Value Ref Range Status   Specimen Description   Final    URINE, CLEAN CATCH Performed at [facility], [place] [address] [place], [place] [address]    Special Requests   Final    NONE Performed at [facility], [place] [name] [place], [place]  [address]    Culture >=100,000 COLONIES/mL STAPHYLOCOCCUS AUREUS (A)  Final   Report Status 04/12/2020 FINAL  Final   Organism ID, Bacteria STAPHYLOCOCCUS AUREUS (A)  Final      Susceptibility   Staphylococcus aureus - MIC*    CIPROFLOXACIN >=8 RESISTANT Resistant     GENTAMICIN <=0.5 SENSITIVE Sensitive     NITROFURANTOIN <=16 SENSITIVE Sensitive     OXACILLIN 0.5 SENSITIVE Sensitive     TETRACYCLINE <=1 SENSITIVE Sensitive     VANCOMYCIN <=0.5 SENSITIVE Sensitive     TRIMETH/SULFA <=10 SENSITIVE Sensitive     CLINDAMYCIN <=0.25 SENSITIVE Sensitive     RIFAMPIN <=0.5 SENSITIVE Sensitive     Inducible Clindamycin NEGATIVE Sensitive     * >=100,000 COLONIES/mL STAPHYLOCOCCUS AUREUS  Body fluid culture     Status: None   Collection Time: 04/10/20  4:05 PM   Specimen: Joint, Left Hip; Synovial Fluid  Result Value Ref Range Status   Specimen Description SYNOVIAL LEFT HIP  Final   Special Requests NONE  Final   Gram Stain   Final    ABUNDANT WBC PRESENT,BOTH PMN AND MONONUCLEAR NO ORGANISMS SEEN    Culture   Final    NO GROWTH 3 DAYS Performed at [facility], [address], [place], [place] [address]    Report Status 04/14/2020 FINAL  Final  Surgical PCR screen     Status: Abnormal   Collection Time: 04/10/20 11:00 PM   Specimen: Nasal Mucosa; Nasal Swab  Result Value Ref Range Status   MRSA, PCR POSITIVE (A) NEGATIVE Final    Comment: RESULT CALLED TO, READ BACK BY AND VERIFIED WITH: Clydene Laming RN 04/11/20 0123 JDW    Staphylococcus aureus POSITIVE  (A) NEGATIVE Final    Comment: (NOTE) The Xpert SA Assay (FDA approved for NASAL specimens in patients 64 years of age and older), is one component of a comprehensive surveillance program. It is not intended to diagnose infection nor to guide or monitor treatment. Performed at [facility], [place] [address] [place], [place] [address]   Aerobic/Anaerobic Culture (surgical/deep wound)     Status: None (Preliminary result)   Collection Time: 04/11/20  3:09 PM   Specimen: PATH Other; Body Fluid  Result Value Ref Range Status   Specimen Description FLUID LEFT HIP  Final   Special Requests SWAB NO 1 PT ON ANCEF  Final   Gram Stain   Final    RARE WBC PRESENT,BOTH PMN AND MONONUCLEAR NO ORGANISMS SEEN    Culture   Final    NO GROWTH 4 DAYS NO ANAEROBES ISOLATED; CULTURE IN PROGRESS FOR 5 DAYS Performed at [facility], [address], [place], [place] [address]    Report Status PENDING  Incomplete  Aerobic/Anaerobic Culture (surgical/deep wound)     Status: None (Preliminary result)   Collection Time: 04/11/20  3:11 PM   Specimen: PATH Other; Body Fluid  Result Value Ref Range Status   Specimen Description FLUID LEFT HIP  Final   Special Requests SWAB NO 2 PT ON ANCEF  Final   Gram Stain   Final    RARE WBC PRESENT,BOTH PMN AND MONONUCLEAR NO ORGANISMS SEEN    Culture   Final    NO GROWTH 4 DAYS NO ANAEROBES ISOLATED; CULTURE IN PROGRESS FOR 5 DAYS Performed at [facility], [address], [place],  [address]    Report Status PENDING  Incomplete  Aerobic/Anaerobic Culture (surgical/deep wound)  Status: None (Preliminary result)   Collection Time: 04/11/20  3:24 PM   Specimen: PATH Other; Tissue  Result Value Ref Range Status   Specimen Description TISSUE LEFT HIP  Final   Special Requests FROM HIP JOINT,PT ON ANCEF  Final   Gram Stain   Final    FEW WBC PRESENT, PREDOMINANTLY MONONUCLEAR NO ORGANISMS SEEN    Culture   Final    NO GROWTH 4 DAYS NO  ANAEROBES ISOLATED; CULTURE IN PROGRESS FOR 5 DAYS Performed at [facility], [place] [address] [place], [place] [address]    Report Status PENDING  Incomplete  Culture, blood (routine x 2)     Status: None (Preliminary result)   Collection Time: 04/12/20 10:22 AM   Specimen: BLOOD RIGHT HAND  Result Value Ref Range Status   Specimen Description BLOOD RIGHT HAND  Final   Special Requests   Final    BOTTLES DRAWN AEROBIC AND ANAEROBIC Blood Culture adequate volume   Culture   Final    NO GROWTH 2 DAYS Performed at [facility], [place] [address], [place], [place] [address]    Report Status PENDING  Incomplete  Culture, blood (routine x 2)     Status: None (Preliminary result)   Collection Time: 04/12/20 10:27 AM   Specimen: BLOOD RIGHT HAND  Result Value Ref Range Status   Specimen Description BLOOD RIGHT HAND  Final   Special Requests   Final    BOTTLES DRAWN AEROBIC ONLY Blood Culture adequate volume   Culture   Final    NO GROWTH 2 DAYS Performed at [facility], [place] [address], [place], [place] [address]    Report Status PENDING  Incomplete    Michel Bickers, MD [facility] for Infectious [place] [facility] [phone] pager   [phone] cell 04/15/2020, 10:53 AM

## 2020-04-15 NOTE — Progress Notes (Signed)
PT Cancellation Note  Patient Details Name: [name] MRN: [id number] DOB: 03/24/1966   Cancelled Treatment:     Pt deferred PT tx session, reporting he worked with OT 15 mins prior and already reviewed transfers and posterior hip precautions. Verbally re-reviewed precautions and use of pillow with transfers to maintain precautions during transition to edge of bed. Daughter present in room and receptive to instruction. Offered to review HEP with pt but he continues to adamantly refuse, citing upcoming discharge. MD/RN notified.  Houston Siren., PTA Acute Rehabilitation Services Pager: please message via [place], pager not currently functional. Office: [place] 04/15/2020, 11:17 AM

## 2020-04-15 NOTE — TOC Progression Note (Signed)
Transition of Care [facility]) - Progression Note    Patient Details  Name: [name] MRN: [id number] Date of Birth: 23-Feb-1966  Transition of Care [facility]) CM/SW Contact  Tiffani Kadow, Edson Snowball, RN Phone Number: 04/15/2020, 12:49 PM  Clinical Narrative:     Tommi Rumps with [facility] and Pam with [facility] ready for discharge today.     Expected Discharge Plan: [place] Barriers to Discharge: Continued Medical Work up  Expected Discharge Plan and Services Expected Discharge Plan: [place]   Discharge Planning Services: CM Consult Post Acute Care Choice: [place] arrangements for the past 2 months: Single Family Home Expected Discharge Date: 04/15/20                 DME Agency: NA       HH Arranged: PT, RN [place] Agency: [place] Date [facility] Agency Contacted: 04/14/20 Time [place]: 1243 Representative spoke with at [place]: left message   Social Determinants of Health (SDOH) Interventions    Readmission Risk Interventions Readmission Risk Prevention Plan 08/21/2019  Transportation Screening Complete  HRI or [place] Complete  Social Work Consult for [place] Planning/Counseling Complete  Palliative Care Screening Not Applicable  Medication Review Press photographer) Complete  Some recent data might be hidden

## 2020-04-15 NOTE — Progress Notes (Signed)
[name] to be Discharged home per MD order.  Discussed prescriptions and follow up appointments with the patient. Prescriptions sent to patient's preferred pharmacy, medication list explained in detail, and informed patient that [name] will be sending a dose of IV antibiotics to his house this evening, since he got his morning dose here. Patient & family verbalized understanding.  Allergies as of 04/15/2020      Reactions   Bee Venom Anaphylaxis   Hydrocodone Nausea And Vomiting   (12/10/19 - pt says he has taken without issues)      Medication List    STOP taking these medications   acetaminophen 325 MG tablet Commonly known as: TYLENOL   escitalopram 20 MG tablet Commonly known as: Lexapro   Oxycodone HCl 10 MG Tabs   Oxycodone HCl 20 MG Tabs     TAKE these medications   ceFAZolin  IVPB Commonly known as: ANCEF Inject 2 g into the vein every 8 (eight) hours. 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   cyclobenzaprine 10 MG tablet Commonly known as: FLEXERIL TAKE 1 TABLET BY MOUTH THREE TIMES A DAY AS NEEDED FOR MUSCLE SPASMS What changed: See the new instructions.   enoxaparin 40 MG/0.4ML injection Commonly known as: LOVENOX Inject 0.4 mLs (40 mg total) into the skin daily.   omeprazole 20 MG tablet Commonly known as: PRILOSEC OTC Take 20 mg by mouth daily as needed (heart burn).   oxyCODONE-acetaminophen 5-325 MG tablet Commonly known as: Percocet Take 1-2 tablets by mouth every 6 (six) hours as needed for severe pain.    polyethylene glycol 17 g packet Commonly known as: MIRALAX / GLYCOLAX Take 17 g by mouth 2 (two) times daily.   rifampin 300 MG capsule Commonly known as: Rifadin Take 2 capsules (600 mg total) by mouth daily.   senna-docusate 8.6-50 MG tablet Commonly known as: Senokot-S Take 1 tablet by mouth 2 (two) times daily.            Durable Medical Equipment  (From admission, onward)         Start     Ordered   04/11/20 1736  DME Walker rolling  Once       Question:  Patient needs a walker to treat with the following condition  Answer:  History of hip replacement   04/11/20 1735   04/11/20 1736  DME 3 n 1  Once        04/11/20 1735   04/11/20 1736  DME Bedside commode  Once       Question:  Patient needs a bedside commode to treat with the following condition  Answer:  History of hip replacement   04/11/20 1735           Discharge Care Instructions  (From admission, onward)         Start     Ordered   04/15/20 0000  Change dressing on IV access line weekly and PRN  (Home infusion instructions - [facility] )        04/15/20 1058   04/15/20 0000  Discharge  wound care:       Comments: Reinforce dressing as needed   04/15/20 1058          Vitals:   04/14/20 2156 04/15/20 0531  BP: 113/70 105/65  Pulse: 79 62  Resp: 18 17  Temp: 98.9 F (37.2 C) 98.7 F (37.1 C)  SpO2: 99% 96%    PICC line flushed and locked by IV RN. Site without signs and symptoms of complications. Patient treated for left hip pain prior to discharge with oxycodone & flexeril. No other complaints noted.  An After Visit Summary was printed and given to the patient. Patient escorted via Wheelchair, and Discharged home via private auto with family.  [name] Shire, RN BSN 04/15/2020 12:45 PM

## 2020-04-15 NOTE — Plan of Care (Signed)
  Problem: Pain Managment: Goal: General experience of comfort will improve Outcome: Progressing Note: Treated for left hip pain   Problem: Education: Goal: Knowledge of General Education information will improve Description: Including pain rating scale, medication(s)/side effects and non-pharmacologic comfort measures Outcome: Completed/Met   Problem: Clinical Measurements: Goal: Respiratory complications will improve Outcome: Completed/Met Note: On room air   Problem: Activity: Goal: Risk for activity intolerance will decrease Outcome: Completed/Met Note: Up with 1 assist, using walker and BSC tolerating fair. DME already at home   Problem: Nutrition: Goal: Adequate nutrition will be maintained Outcome: Completed/Met   Problem: Coping: Goal: Level of anxiety will decrease Outcome: Completed/Met   Problem: Elimination: Goal: Will not experience complications related to bowel motility Outcome: Completed/Met Note: BM 04/14/20- miralax and sennokot given Goal: Will not experience complications related to urinary retention Outcome: Completed/Met   Problem: Safety: Goal: Ability to remain free from injury will improve Outcome: Completed/Met

## 2020-04-16 LAB — AEROBIC/ANAEROBIC CULTURE W GRAM STAIN (SURGICAL/DEEP WOUND)
Culture: NO GROWTH
Culture: NO GROWTH
Culture: NO GROWTH

## 2020-04-17 ENCOUNTER — Telehealth: Payer: Self-pay

## 2020-04-17 DIAGNOSIS — F329 Major depressive disorder, single episode, unspecified: Secondary | ICD-10-CM | POA: Diagnosis not present

## 2020-04-17 DIAGNOSIS — M4056 Lordosis, unspecified, lumbar region: Secondary | ICD-10-CM | POA: Diagnosis not present

## 2020-04-17 DIAGNOSIS — M47816 Spondylosis without myelopathy or radiculopathy, lumbar region: Secondary | ICD-10-CM | POA: Diagnosis not present

## 2020-04-17 DIAGNOSIS — T8452XA Infection and inflammatory reaction due to internal left hip prosthesis, initial encounter: Secondary | ICD-10-CM | POA: Diagnosis not present

## 2020-04-17 DIAGNOSIS — M48061 Spinal stenosis, lumbar region without neurogenic claudication: Secondary | ICD-10-CM | POA: Diagnosis not present

## 2020-04-17 DIAGNOSIS — I700 Atherosclerosis of aorta: Secondary | ICD-10-CM | POA: Diagnosis not present

## 2020-04-17 DIAGNOSIS — M00852 Arthritis due to other bacteria, left hip: Secondary | ICD-10-CM | POA: Diagnosis not present

## 2020-04-17 DIAGNOSIS — F419 Anxiety disorder, unspecified: Secondary | ICD-10-CM | POA: Diagnosis not present

## 2020-04-17 DIAGNOSIS — D1809 Hemangioma of other sites: Secondary | ICD-10-CM | POA: Diagnosis not present

## 2020-04-17 DIAGNOSIS — M6008 Infective myositis, other site: Secondary | ICD-10-CM | POA: Diagnosis not present

## 2020-04-17 DIAGNOSIS — M5136 Other intervertebral disc degeneration, lumbar region: Secondary | ICD-10-CM | POA: Diagnosis not present

## 2020-04-17 DIAGNOSIS — M4628 Osteomyelitis of vertebra, sacral and sacrococcygeal region: Secondary | ICD-10-CM | POA: Diagnosis not present

## 2020-04-17 DIAGNOSIS — G834 Cauda equina syndrome: Secondary | ICD-10-CM | POA: Diagnosis not present

## 2020-04-17 DIAGNOSIS — E871 Hypo-osmolality and hyponatremia: Secondary | ICD-10-CM | POA: Diagnosis not present

## 2020-04-17 DIAGNOSIS — Q6100 Congenital renal cyst, unspecified: Secondary | ICD-10-CM | POA: Diagnosis not present

## 2020-04-17 DIAGNOSIS — M7062 Trochanteric bursitis, left hip: Secondary | ICD-10-CM | POA: Diagnosis not present

## 2020-04-17 LAB — CULTURE, BLOOD (ROUTINE X 2)
Culture: NO GROWTH
Culture: NO GROWTH
Special Requests: ADEQUATE
Special Requests: ADEQUATE

## 2020-04-17 NOTE — Telephone Encounter (Signed)
Please advise 

## 2020-04-17 NOTE — Telephone Encounter (Signed)
Diane called in wanting to know if patient needs wound care or leave dressing alone , and if patient needs home health orders.

## 2020-04-18 ENCOUNTER — Telehealth: Payer: Self-pay

## 2020-04-18 DIAGNOSIS — M00852 Arthritis due to other bacteria, left hip: Secondary | ICD-10-CM | POA: Diagnosis not present

## 2020-04-18 DIAGNOSIS — M47816 Spondylosis without myelopathy or radiculopathy, lumbar region: Secondary | ICD-10-CM | POA: Diagnosis not present

## 2020-04-18 DIAGNOSIS — M7062 Trochanteric bursitis, left hip: Secondary | ICD-10-CM | POA: Diagnosis not present

## 2020-04-18 DIAGNOSIS — D1809 Hemangioma of other sites: Secondary | ICD-10-CM | POA: Diagnosis not present

## 2020-04-18 DIAGNOSIS — G834 Cauda equina syndrome: Secondary | ICD-10-CM | POA: Diagnosis not present

## 2020-04-18 DIAGNOSIS — M5136 Other intervertebral disc degeneration, lumbar region: Secondary | ICD-10-CM | POA: Diagnosis not present

## 2020-04-18 DIAGNOSIS — I700 Atherosclerosis of aorta: Secondary | ICD-10-CM | POA: Diagnosis not present

## 2020-04-18 DIAGNOSIS — M48061 Spinal stenosis, lumbar region without neurogenic claudication: Secondary | ICD-10-CM | POA: Diagnosis not present

## 2020-04-18 DIAGNOSIS — T8452XA Infection and inflammatory reaction due to internal left hip prosthesis, initial encounter: Secondary | ICD-10-CM | POA: Diagnosis not present

## 2020-04-18 DIAGNOSIS — F329 Major depressive disorder, single episode, unspecified: Secondary | ICD-10-CM | POA: Diagnosis not present

## 2020-04-18 DIAGNOSIS — F419 Anxiety disorder, unspecified: Secondary | ICD-10-CM | POA: Diagnosis not present

## 2020-04-18 DIAGNOSIS — M4056 Lordosis, unspecified, lumbar region: Secondary | ICD-10-CM | POA: Diagnosis not present

## 2020-04-18 DIAGNOSIS — E871 Hypo-osmolality and hyponatremia: Secondary | ICD-10-CM | POA: Diagnosis not present

## 2020-04-18 DIAGNOSIS — Q6100 Congenital renal cyst, unspecified: Secondary | ICD-10-CM | POA: Diagnosis not present

## 2020-04-18 DIAGNOSIS — M6008 Infective myositis, other site: Secondary | ICD-10-CM | POA: Diagnosis not present

## 2020-04-18 DIAGNOSIS — M4628 Osteomyelitis of vertebra, sacral and sacrococcygeal region: Secondary | ICD-10-CM | POA: Diagnosis not present

## 2020-04-18 NOTE — Telephone Encounter (Signed)
See message below concerning stronger pain medicine.  Thank you

## 2020-04-18 NOTE — Telephone Encounter (Signed)
Mel from [facility] home health called patient is  in severe left hip pain 8/10 which is interfering with his mobility and transfers patient unable to get to the toilet. Mel is requesting prescription for stronger pain medication to be sent in. Call back:[phone]

## 2020-04-21 ENCOUNTER — Other Ambulatory Visit: Payer: Self-pay | Admitting: Physician Assistant

## 2020-04-21 DIAGNOSIS — E871 Hypo-osmolality and hyponatremia: Secondary | ICD-10-CM | POA: Diagnosis not present

## 2020-04-21 DIAGNOSIS — M4628 Osteomyelitis of vertebra, sacral and sacrococcygeal region: Secondary | ICD-10-CM | POA: Diagnosis not present

## 2020-04-21 DIAGNOSIS — M5136 Other intervertebral disc degeneration, lumbar region: Secondary | ICD-10-CM | POA: Diagnosis not present

## 2020-04-21 DIAGNOSIS — D1809 Hemangioma of other sites: Secondary | ICD-10-CM | POA: Diagnosis not present

## 2020-04-21 DIAGNOSIS — I700 Atherosclerosis of aorta: Secondary | ICD-10-CM | POA: Diagnosis not present

## 2020-04-21 DIAGNOSIS — G834 Cauda equina syndrome: Secondary | ICD-10-CM | POA: Diagnosis not present

## 2020-04-21 DIAGNOSIS — M7062 Trochanteric bursitis, left hip: Secondary | ICD-10-CM | POA: Diagnosis not present

## 2020-04-21 DIAGNOSIS — T8452XA Infection and inflammatory reaction due to internal left hip prosthesis, initial encounter: Secondary | ICD-10-CM | POA: Diagnosis not present

## 2020-04-21 DIAGNOSIS — M4056 Lordosis, unspecified, lumbar region: Secondary | ICD-10-CM | POA: Diagnosis not present

## 2020-04-21 DIAGNOSIS — M47816 Spondylosis without myelopathy or radiculopathy, lumbar region: Secondary | ICD-10-CM | POA: Diagnosis not present

## 2020-04-21 DIAGNOSIS — F419 Anxiety disorder, unspecified: Secondary | ICD-10-CM | POA: Diagnosis not present

## 2020-04-21 DIAGNOSIS — M00852 Arthritis due to other bacteria, left hip: Secondary | ICD-10-CM | POA: Diagnosis not present

## 2020-04-21 DIAGNOSIS — M6008 Infective myositis, other site: Secondary | ICD-10-CM | POA: Diagnosis not present

## 2020-04-21 DIAGNOSIS — F329 Major depressive disorder, single episode, unspecified: Secondary | ICD-10-CM | POA: Diagnosis not present

## 2020-04-21 DIAGNOSIS — Z792 Long term (current) use of antibiotics: Secondary | ICD-10-CM | POA: Diagnosis not present

## 2020-04-21 DIAGNOSIS — Q6100 Congenital renal cyst, unspecified: Secondary | ICD-10-CM | POA: Diagnosis not present

## 2020-04-21 DIAGNOSIS — M48061 Spinal stenosis, lumbar region without neurogenic claudication: Secondary | ICD-10-CM | POA: Diagnosis not present

## 2020-04-21 MED ORDER — METHOCARBAMOL 500 MG PO TABS: 500.0000 mg | ORAL_TABLET | Freq: Two times a day (BID) | ORAL | 0 refills | Status: DC | PRN

## 2020-04-21 NOTE — Telephone Encounter (Signed)
Talked with [name] and advised him of message below and patient is also aware that Rx was sent to pharmacy.

## 2020-04-21 NOTE — Telephone Encounter (Signed)
Could very likely be referred from back as he has an epidural abscess I believe.  I have called in robaxin

## 2020-04-23 ENCOUNTER — Encounter: Payer: Self-pay | Admitting: Family Medicine

## 2020-04-23 DIAGNOSIS — R7881 Bacteremia: Secondary | ICD-10-CM | POA: Diagnosis not present

## 2020-04-23 DIAGNOSIS — T8452XA Infection and inflammatory reaction due to internal left hip prosthesis, initial encounter: Secondary | ICD-10-CM | POA: Diagnosis not present

## 2020-04-23 DIAGNOSIS — M4646 Discitis, unspecified, lumbar region: Secondary | ICD-10-CM | POA: Diagnosis not present

## 2020-04-24 ENCOUNTER — Telehealth: Payer: Self-pay | Admitting: *Deleted

## 2020-04-24 ENCOUNTER — Telehealth: Payer: Self-pay

## 2020-04-24 NOTE — Telephone Encounter (Signed)
Pt's daughter Ander Purpura left message at Triage. since being d/c from hospital pt hasn't been able to get up and walk, due to weakness and severe pain. She said when he got home from the hospital she could barely get him in the house and when she did she got him to his recliner in the living room. Pt stayed in the recliner for 4 days. Daughter then helped him get to his bed and he has been in his bed ever since. When she tries to get him up to do anything he is in so much pain he is screaming out. She said PT has came out to the home but they are unable to do anything due to pain. Daughter said its his lower back that he said is the most painful area. Daughter is afraid she is not going to be able to get him out the house next week for his Ortho f/u due to him not being able to walk.   I did advise daughter to call ortho as well and tell them her concerns too and I will send this message to PCP. Daughter said she had a Pharmacist, community message from PCP yesterday saying let her know if any concerns so she wanted to reach out. Daughter doesn't know what to do at this point

## 2020-04-24 NOTE — Telephone Encounter (Signed)
Agree with ortho follow-up appointment.   I think it is important that he have an in-person visit either here or with ortho to evaluate.   If severe pain and worsening this is concerning.   I reached out since he had not been schedule for hospital follow-up and since he is not doing well would recommend follow-up though may benefit from being seen tomorrow if any appointments are available

## 2020-04-24 NOTE — Telephone Encounter (Signed)
Patient daughter called she stated patient has a appt. Next Tuesday,  she cant get patient out of the bed because of excruciating pain in lower back and the left hip she is requesting a call back from Dr.Xu on the next step Call back:[phone] .

## 2020-04-24 NOTE — Telephone Encounter (Signed)
Spoke to [employer], pt's daughter, DPR and she explained that her dad can not walk and is in severe pain. [name] has reached out to ortho but is waiting for a call back. [name] states that Pt has stated that his legs feel numb but he can move them and wiggle his toes. He states that he "thinks the infection is in his back or his back is broken."  These things were brought to Dr. Verda Cumins attention and she feels that the pt needs to go back to the hospital and be reevaluated before he gets bed sores.  Suggestion will be made to daughter, Ander Purpura.

## 2020-04-24 NOTE — Telephone Encounter (Signed)
I called the number and the voicemail has not been set up.

## 2020-04-24 NOTE — Telephone Encounter (Signed)
Spoke to daughter, Ander Purpura, [facility]) and let her know of Dr. Verda Cumins suggestion to get pt back to hospital based on the condition he is in. [name] states she will talk to pt and take him back to ER.

## 2020-04-25 ENCOUNTER — Telehealth: Payer: Self-pay | Admitting: Orthopaedic Surgery

## 2020-04-25 DIAGNOSIS — M4056 Lordosis, unspecified, lumbar region: Secondary | ICD-10-CM | POA: Diagnosis not present

## 2020-04-25 DIAGNOSIS — F329 Major depressive disorder, single episode, unspecified: Secondary | ICD-10-CM | POA: Diagnosis not present

## 2020-04-25 DIAGNOSIS — I700 Atherosclerosis of aorta: Secondary | ICD-10-CM | POA: Diagnosis not present

## 2020-04-25 DIAGNOSIS — D1809 Hemangioma of other sites: Secondary | ICD-10-CM | POA: Diagnosis not present

## 2020-04-25 DIAGNOSIS — M47816 Spondylosis without myelopathy or radiculopathy, lumbar region: Secondary | ICD-10-CM | POA: Diagnosis not present

## 2020-04-25 DIAGNOSIS — M4628 Osteomyelitis of vertebra, sacral and sacrococcygeal region: Secondary | ICD-10-CM | POA: Diagnosis not present

## 2020-04-25 DIAGNOSIS — T8452XA Infection and inflammatory reaction due to internal left hip prosthesis, initial encounter: Secondary | ICD-10-CM | POA: Diagnosis not present

## 2020-04-25 DIAGNOSIS — M6008 Infective myositis, other site: Secondary | ICD-10-CM | POA: Diagnosis not present

## 2020-04-25 DIAGNOSIS — M5136 Other intervertebral disc degeneration, lumbar region: Secondary | ICD-10-CM | POA: Diagnosis not present

## 2020-04-25 DIAGNOSIS — Q6100 Congenital renal cyst, unspecified: Secondary | ICD-10-CM | POA: Diagnosis not present

## 2020-04-25 DIAGNOSIS — M48061 Spinal stenosis, lumbar region without neurogenic claudication: Secondary | ICD-10-CM | POA: Diagnosis not present

## 2020-04-25 DIAGNOSIS — F419 Anxiety disorder, unspecified: Secondary | ICD-10-CM | POA: Diagnosis not present

## 2020-04-25 DIAGNOSIS — M00852 Arthritis due to other bacteria, left hip: Secondary | ICD-10-CM | POA: Diagnosis not present

## 2020-04-25 DIAGNOSIS — M7062 Trochanteric bursitis, left hip: Secondary | ICD-10-CM | POA: Diagnosis not present

## 2020-04-25 DIAGNOSIS — E871 Hypo-osmolality and hyponatremia: Secondary | ICD-10-CM | POA: Diagnosis not present

## 2020-04-25 DIAGNOSIS — G834 Cauda equina syndrome: Secondary | ICD-10-CM | POA: Diagnosis not present

## 2020-04-25 NOTE — Telephone Encounter (Signed)
Mel with [facility] called stating the pt is suffering from sever hip pain and as a results hasn't been able to do the transfers or mobility exercises at PT and pt is refusing to go to the ER.  Mel CB# [phone]

## 2020-04-25 NOTE — Telephone Encounter (Signed)
Nothing I can do if the patient is refusing care.

## 2020-04-28 DIAGNOSIS — M00852 Arthritis due to other bacteria, left hip: Secondary | ICD-10-CM | POA: Diagnosis not present

## 2020-04-28 DIAGNOSIS — Q6100 Congenital renal cyst, unspecified: Secondary | ICD-10-CM | POA: Diagnosis not present

## 2020-04-28 DIAGNOSIS — M47816 Spondylosis without myelopathy or radiculopathy, lumbar region: Secondary | ICD-10-CM | POA: Diagnosis not present

## 2020-04-28 DIAGNOSIS — I700 Atherosclerosis of aorta: Secondary | ICD-10-CM | POA: Diagnosis not present

## 2020-04-28 DIAGNOSIS — M4056 Lordosis, unspecified, lumbar region: Secondary | ICD-10-CM | POA: Diagnosis not present

## 2020-04-28 DIAGNOSIS — M4628 Osteomyelitis of vertebra, sacral and sacrococcygeal region: Secondary | ICD-10-CM | POA: Diagnosis not present

## 2020-04-28 DIAGNOSIS — G834 Cauda equina syndrome: Secondary | ICD-10-CM | POA: Diagnosis not present

## 2020-04-28 DIAGNOSIS — D1809 Hemangioma of other sites: Secondary | ICD-10-CM | POA: Diagnosis not present

## 2020-04-28 DIAGNOSIS — E871 Hypo-osmolality and hyponatremia: Secondary | ICD-10-CM | POA: Diagnosis not present

## 2020-04-28 DIAGNOSIS — M5136 Other intervertebral disc degeneration, lumbar region: Secondary | ICD-10-CM | POA: Diagnosis not present

## 2020-04-28 DIAGNOSIS — M7062 Trochanteric bursitis, left hip: Secondary | ICD-10-CM | POA: Diagnosis not present

## 2020-04-28 DIAGNOSIS — F419 Anxiety disorder, unspecified: Secondary | ICD-10-CM | POA: Diagnosis not present

## 2020-04-28 DIAGNOSIS — F329 Major depressive disorder, single episode, unspecified: Secondary | ICD-10-CM | POA: Diagnosis not present

## 2020-04-28 DIAGNOSIS — T8452XA Infection and inflammatory reaction due to internal left hip prosthesis, initial encounter: Secondary | ICD-10-CM | POA: Diagnosis not present

## 2020-04-28 DIAGNOSIS — M6008 Infective myositis, other site: Secondary | ICD-10-CM | POA: Diagnosis not present

## 2020-04-28 DIAGNOSIS — M48061 Spinal stenosis, lumbar region without neurogenic claudication: Secondary | ICD-10-CM | POA: Diagnosis not present

## 2020-04-29 ENCOUNTER — Ambulatory Visit (INDEPENDENT_AMBULATORY_CARE_PROVIDER_SITE_OTHER): Payer: BC Managed Care – PPO | Admitting: Orthopaedic Surgery

## 2020-04-29 ENCOUNTER — Encounter: Payer: Self-pay | Admitting: Orthopaedic Surgery

## 2020-04-29 ENCOUNTER — Other Ambulatory Visit: Payer: Self-pay | Admitting: Physician Assistant

## 2020-04-29 DIAGNOSIS — T8452XD Infection and inflammatory reaction due to internal left hip prosthesis, subsequent encounter: Secondary | ICD-10-CM

## 2020-04-29 MED ORDER — ASPIRIN EC 81 MG PO TBEC: 81.0000 mg | DELAYED_RELEASE_TABLET | Freq: Two times a day (BID) | ORAL | 0 refills | Status: DC | PRN

## 2020-04-29 MED ORDER — OXYCODONE-ACETAMINOPHEN 5-325 MG PO TABS: 1.0000 | ORAL_TABLET | Freq: Four times a day (QID) | ORAL | 0 refills | Status: DC | PRN

## 2020-04-29 NOTE — Progress Notes (Signed)
Post-Op Visit Note   Patient: [name]           Date of Birth: [birth year]           MRN: [id number] Visit Date: 04/29/2020 PCP: Lesleigh Noe, MD   Assessment & Plan:  Chief Complaint:  Chief Complaint  Patient presents with  . Left Hip - Pain   Visit Diagnoses:  1. Infection associated with internal left hip prosthesis, subsequent encounter     Plan: Patient is a pleasant 54 year old gentleman comes in today approximately 3 weeks out left hip irrigation debridement from a prosthetic hip infection 04/11/2020.  He has still been in a fair amount of pain.  He is taking oxycodone every 6 hours.  He has not been taking the Lovenox for DVT prophylaxis.  He has a PICC line and is taking rifampin and cefazolin.  He has a follow-up with [name] of infectious disease on 05/20/2020.  No fevers or chills.  No growth to date on intraoperative cultures.  Examination of his left hip reveals well-healing surgical incision with staples intact.  No evidence of cellulitis or infection.  Calves are soft and nontender.  He is neurovascular intact distally.  Today, staples were removed and Steri-Strips applied.  He will continue with his antibiotics until he sees [name].  Have called in aspirin 81 mg twice daily for the next 3 weeks to take for DVT prophylaxis.  He will follow up with [place] in 3 weeks time for repeat evaluation.  Call with concerns or questions in meantime.  Follow-Up Instructions: Return in about 3 weeks (around 05/20/2020).   Orders:  No orders of the defined types were placed in this encounter.  Meds ordered this encounter  Medications  . aspirin EC 81 MG tablet    Sig: Take 1 tablet (81 mg total) by mouth 2 (two) times daily as needed.    Dispense:  60 tablet    Refill:  0    Imaging: No new imaging  PMFS History: Patient Active Problem List   Diagnosis Date Noted  . Infection of left prosthetic hip joint ([place]) 04/11/2020  . MSSA bacteremia 04/11/2020  .  Acute low back pain 04/11/2020  . Adjustment disorder with depressed mood 11/19/2019  . Left hip pain 10/09/2019  . Blurry vision, left eye 10/09/2019  . Hyponatremia 08/20/2019  . S/P lumbar laminectomy 07/25/2019  . H/O MSSA epidural abscess, L2-L5 07/25/2019  . Anemia 07/18/2019  . Obesity (BMI 35.0-39.9 without comorbidity) 07/18/2019  . Constipation 07/18/2019  . Elevated blood sugar 04/01/2017  . Alcohol abuse, in remission 10/12/2016  . S/P total hip arthroplasty 10/12/2016   Past Medical History:  Diagnosis Date  . Cauda equina syndrome ([place]) 07/31/2019  . Cerebral septic emboli ([place]) 07/30/2019  . History of chicken pox   . Medical history non-contributory     Family History  Problem Relation Age of Onset  . Cancer Father        Hodgkin's disease  . COPD Mother   . Heart attack Maternal Grandfather 80  . Diabetes Neg Hx   . Stroke Neg Hx   . Hypertension Neg Hx   . Hyperlipidemia Neg Hx     Past Surgical History:  Procedure Laterality Date  . CARPAL TUNNEL RELEASE Bilateral 2009  . CATARACT EXTRACTION W/PHACO Left 12/17/2019   Procedure: CATARACT EXTRACTION PHACO AND INTRAOCULAR LENS PLACEMENT (IOC) LEFT;  Surgeon: Eulogio Bear, MD;  Location: [place];  Service:  Ophthalmology;  Laterality: Left;  3.61 0:26.9  . CATARACT EXTRACTION W/PHACO Right 03/17/2020   Procedure: CATARACT EXTRACTION PHACO AND INTRAOCULAR LENS PLACEMENT (IOC) RIGHT 4.41  00:28.1;  Surgeon: Eulogio Bear, MD;  Location: [place];  Service: Ophthalmology;  Laterality: Right;  . COLONOSCOPY W/ POLYPECTOMY    . IR FLUORO GUIDED NEEDLE PLC ASPIRATION/INJECTION LOC  04/10/2020  . JOINT REPLACEMENT    . LACERATION REPAIR Right ~ 2012   leg  . LUMBAR LAMINECTOMY/DECOMPRESSION MICRODISCECTOMY N/A 07/24/2019   Procedure: Decompressive Lumbar Laminectomy Lumbar three-four Lumbar four-five for Epidural Abscess;  Surgeon: Kary Kos, MD;  Location: [place];  Service:  Neurosurgery;  Laterality: N/A;  . TOTAL HIP ARTHROPLASTY Right 01/11/2013   Procedure: TOTAL HIP ARTHROPLASTY;  Surgeon: Garald Balding, MD;  Location: [place];  Service: Orthopedics;  Laterality: Right;  . TOTAL HIP ARTHROPLASTY Left 10/12/2016   Procedure: TOTAL HIP ARTHROPLASTY;  Surgeon: Garald Balding, MD;  Location: [place];  Service: Orthopedics;  Laterality: Left;  . TOTAL HIP ARTHROPLASTY Left 04/11/2020   Procedure: LEFT HIP IRRIGATION AND DEBRIDEMENT WITH HEAD AND POLY SWAP;  Surgeon: Leandrew Koyanagi, MD;  Location: [place];  Service: Orthopedics;  Laterality: Left;   Social History   Occupational History  . 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 Smoking status: Former Smoker    Packs/day: 1.00    Years: 14.00    Pack years: 14.00    Types: Cigarettes    Quit date: 07/27/1999    Years since quitting: 20.7  . Smokeless tobacco: Never Used  Vaping Use  . Vaping Use: Never used  Substance and Sexual Activity  . Alcohol use: Yes    Alcohol/week: 24.0 standard drinks    Types: 24 Cans of beer per week    Comment: 4-5 beers per day, case of beer per week  . Drug use: No  . Sexual activity: Yes    Birth control/protection: Post-menopausal

## 2020-04-30 ENCOUNTER — Other Ambulatory Visit: Payer: Self-pay

## 2020-04-30 ENCOUNTER — Telehealth: Payer: Self-pay | Admitting: Orthopaedic Surgery

## 2020-04-30 DIAGNOSIS — M4646 Discitis, unspecified, lumbar region: Secondary | ICD-10-CM | POA: Diagnosis not present

## 2020-04-30 DIAGNOSIS — R7881 Bacteremia: Secondary | ICD-10-CM | POA: Diagnosis not present

## 2020-04-30 DIAGNOSIS — T8452XA Infection and inflammatory reaction due to internal left hip prosthesis, initial encounter: Secondary | ICD-10-CM | POA: Diagnosis not present

## 2020-04-30 MED ORDER — MELOXICAM 7.5 MG PO TABS: ORAL_TABLET | ORAL | 0 refills | Status: DC

## 2020-04-30 NOTE — Telephone Encounter (Signed)
Called daughter no answer LMOM. Rx sent to pharm.

## 2020-04-30 NOTE — Telephone Encounter (Signed)
Can you send in mobic 7.5mg  1 tab daily prn pain

## 2020-04-30 NOTE — Telephone Encounter (Signed)
Pt's daughter [name] called stating she thought [name] Would be sending in a new medicine for the pt to try an anti-inflammatory, however when they got to the pharmacy it was same rx the pt has been getting; [name] wanted to make sure this was correct?

## 2020-04-30 NOTE — Telephone Encounter (Signed)
Sent in to pharm.

## 2020-05-01 DIAGNOSIS — M7062 Trochanteric bursitis, left hip: Secondary | ICD-10-CM | POA: Diagnosis not present

## 2020-05-01 DIAGNOSIS — M6008 Infective myositis, other site: Secondary | ICD-10-CM | POA: Diagnosis not present

## 2020-05-01 DIAGNOSIS — I700 Atherosclerosis of aorta: Secondary | ICD-10-CM | POA: Diagnosis not present

## 2020-05-01 DIAGNOSIS — M47816 Spondylosis without myelopathy or radiculopathy, lumbar region: Secondary | ICD-10-CM | POA: Diagnosis not present

## 2020-05-01 DIAGNOSIS — M4056 Lordosis, unspecified, lumbar region: Secondary | ICD-10-CM | POA: Diagnosis not present

## 2020-05-01 DIAGNOSIS — G834 Cauda equina syndrome: Secondary | ICD-10-CM | POA: Diagnosis not present

## 2020-05-01 DIAGNOSIS — F329 Major depressive disorder, single episode, unspecified: Secondary | ICD-10-CM | POA: Diagnosis not present

## 2020-05-01 DIAGNOSIS — M48061 Spinal stenosis, lumbar region without neurogenic claudication: Secondary | ICD-10-CM | POA: Diagnosis not present

## 2020-05-01 DIAGNOSIS — T8452XA Infection and inflammatory reaction due to internal left hip prosthesis, initial encounter: Secondary | ICD-10-CM | POA: Diagnosis not present

## 2020-05-01 DIAGNOSIS — M00852 Arthritis due to other bacteria, left hip: Secondary | ICD-10-CM | POA: Diagnosis not present

## 2020-05-01 DIAGNOSIS — M4628 Osteomyelitis of vertebra, sacral and sacrococcygeal region: Secondary | ICD-10-CM | POA: Diagnosis not present

## 2020-05-01 DIAGNOSIS — Q6100 Congenital renal cyst, unspecified: Secondary | ICD-10-CM | POA: Diagnosis not present

## 2020-05-01 DIAGNOSIS — E871 Hypo-osmolality and hyponatremia: Secondary | ICD-10-CM | POA: Diagnosis not present

## 2020-05-01 DIAGNOSIS — D1809 Hemangioma of other sites: Secondary | ICD-10-CM | POA: Diagnosis not present

## 2020-05-01 DIAGNOSIS — M5136 Other intervertebral disc degeneration, lumbar region: Secondary | ICD-10-CM | POA: Diagnosis not present

## 2020-05-01 DIAGNOSIS — F419 Anxiety disorder, unspecified: Secondary | ICD-10-CM | POA: Diagnosis not present

## 2020-05-05 DIAGNOSIS — M47816 Spondylosis without myelopathy or radiculopathy, lumbar region: Secondary | ICD-10-CM | POA: Diagnosis not present

## 2020-05-05 DIAGNOSIS — I700 Atherosclerosis of aorta: Secondary | ICD-10-CM | POA: Diagnosis not present

## 2020-05-05 DIAGNOSIS — G834 Cauda equina syndrome: Secondary | ICD-10-CM | POA: Diagnosis not present

## 2020-05-05 DIAGNOSIS — M5136 Other intervertebral disc degeneration, lumbar region: Secondary | ICD-10-CM | POA: Diagnosis not present

## 2020-05-05 DIAGNOSIS — M48061 Spinal stenosis, lumbar region without neurogenic claudication: Secondary | ICD-10-CM | POA: Diagnosis not present

## 2020-05-05 DIAGNOSIS — M6008 Infective myositis, other site: Secondary | ICD-10-CM | POA: Diagnosis not present

## 2020-05-05 DIAGNOSIS — F419 Anxiety disorder, unspecified: Secondary | ICD-10-CM | POA: Diagnosis not present

## 2020-05-05 DIAGNOSIS — F329 Major depressive disorder, single episode, unspecified: Secondary | ICD-10-CM | POA: Diagnosis not present

## 2020-05-05 DIAGNOSIS — M4056 Lordosis, unspecified, lumbar region: Secondary | ICD-10-CM | POA: Diagnosis not present

## 2020-05-05 DIAGNOSIS — E871 Hypo-osmolality and hyponatremia: Secondary | ICD-10-CM | POA: Diagnosis not present

## 2020-05-05 DIAGNOSIS — D1809 Hemangioma of other sites: Secondary | ICD-10-CM | POA: Diagnosis not present

## 2020-05-05 DIAGNOSIS — Z792 Long term (current) use of antibiotics: Secondary | ICD-10-CM | POA: Diagnosis not present

## 2020-05-05 DIAGNOSIS — Q6100 Congenital renal cyst, unspecified: Secondary | ICD-10-CM | POA: Diagnosis not present

## 2020-05-05 DIAGNOSIS — M4628 Osteomyelitis of vertebra, sacral and sacrococcygeal region: Secondary | ICD-10-CM | POA: Diagnosis not present

## 2020-05-05 DIAGNOSIS — T8452XA Infection and inflammatory reaction due to internal left hip prosthesis, initial encounter: Secondary | ICD-10-CM | POA: Diagnosis not present

## 2020-05-05 DIAGNOSIS — M7062 Trochanteric bursitis, left hip: Secondary | ICD-10-CM | POA: Diagnosis not present

## 2020-05-05 DIAGNOSIS — M00852 Arthritis due to other bacteria, left hip: Secondary | ICD-10-CM | POA: Diagnosis not present

## 2020-05-07 ENCOUNTER — Telehealth: Payer: Self-pay

## 2020-05-07 DIAGNOSIS — Q6100 Congenital renal cyst, unspecified: Secondary | ICD-10-CM | POA: Diagnosis not present

## 2020-05-07 DIAGNOSIS — F419 Anxiety disorder, unspecified: Secondary | ICD-10-CM | POA: Diagnosis not present

## 2020-05-07 DIAGNOSIS — I700 Atherosclerosis of aorta: Secondary | ICD-10-CM | POA: Diagnosis not present

## 2020-05-07 DIAGNOSIS — M00852 Arthritis due to other bacteria, left hip: Secondary | ICD-10-CM | POA: Diagnosis not present

## 2020-05-07 DIAGNOSIS — G834 Cauda equina syndrome: Secondary | ICD-10-CM | POA: Diagnosis not present

## 2020-05-07 DIAGNOSIS — R7881 Bacteremia: Secondary | ICD-10-CM | POA: Diagnosis not present

## 2020-05-07 DIAGNOSIS — M47816 Spondylosis without myelopathy or radiculopathy, lumbar region: Secondary | ICD-10-CM | POA: Diagnosis not present

## 2020-05-07 DIAGNOSIS — M4056 Lordosis, unspecified, lumbar region: Secondary | ICD-10-CM | POA: Diagnosis not present

## 2020-05-07 DIAGNOSIS — E871 Hypo-osmolality and hyponatremia: Secondary | ICD-10-CM | POA: Diagnosis not present

## 2020-05-07 DIAGNOSIS — M4628 Osteomyelitis of vertebra, sacral and sacrococcygeal region: Secondary | ICD-10-CM | POA: Diagnosis not present

## 2020-05-07 DIAGNOSIS — M7062 Trochanteric bursitis, left hip: Secondary | ICD-10-CM | POA: Diagnosis not present

## 2020-05-07 DIAGNOSIS — F329 Major depressive disorder, single episode, unspecified: Secondary | ICD-10-CM | POA: Diagnosis not present

## 2020-05-07 DIAGNOSIS — M4646 Discitis, unspecified, lumbar region: Secondary | ICD-10-CM | POA: Diagnosis not present

## 2020-05-07 DIAGNOSIS — T8452XA Infection and inflammatory reaction due to internal left hip prosthesis, initial encounter: Secondary | ICD-10-CM | POA: Diagnosis not present

## 2020-05-07 DIAGNOSIS — M48061 Spinal stenosis, lumbar region without neurogenic claudication: Secondary | ICD-10-CM | POA: Diagnosis not present

## 2020-05-07 DIAGNOSIS — D1809 Hemangioma of other sites: Secondary | ICD-10-CM | POA: Diagnosis not present

## 2020-05-07 DIAGNOSIS — M6008 Infective myositis, other site: Secondary | ICD-10-CM | POA: Diagnosis not present

## 2020-05-07 DIAGNOSIS — M5136 Other intervertebral disc degeneration, lumbar region: Secondary | ICD-10-CM | POA: Diagnosis not present

## 2020-05-07 NOTE — Telephone Encounter (Signed)
Mel from [facility] home health called he stated the patient is making good progress but is still limited with 8/10 pain scale which is interfering with rehab patient stated he ran out of pain pills over the weekend and is requesting a refill for oxycodone call back:[phone]

## 2020-05-08 ENCOUNTER — Other Ambulatory Visit: Payer: Self-pay | Admitting: Physician Assistant

## 2020-05-08 MED ORDER — OXYCODONE-ACETAMINOPHEN 5-325 MG PO TABS: 1.0000 | ORAL_TABLET | Freq: Three times a day (TID) | ORAL | 0 refills | Status: DC | PRN

## 2020-05-08 NOTE — Telephone Encounter (Signed)
Patient aware.

## 2020-05-08 NOTE — Telephone Encounter (Signed)
Sent in

## 2020-05-12 DIAGNOSIS — M47816 Spondylosis without myelopathy or radiculopathy, lumbar region: Secondary | ICD-10-CM | POA: Diagnosis not present

## 2020-05-12 DIAGNOSIS — E871 Hypo-osmolality and hyponatremia: Secondary | ICD-10-CM | POA: Diagnosis not present

## 2020-05-12 DIAGNOSIS — D1809 Hemangioma of other sites: Secondary | ICD-10-CM | POA: Diagnosis not present

## 2020-05-12 DIAGNOSIS — M48061 Spinal stenosis, lumbar region without neurogenic claudication: Secondary | ICD-10-CM | POA: Diagnosis not present

## 2020-05-12 DIAGNOSIS — M6008 Infective myositis, other site: Secondary | ICD-10-CM | POA: Diagnosis not present

## 2020-05-12 DIAGNOSIS — M4056 Lordosis, unspecified, lumbar region: Secondary | ICD-10-CM | POA: Diagnosis not present

## 2020-05-12 DIAGNOSIS — F329 Major depressive disorder, single episode, unspecified: Secondary | ICD-10-CM | POA: Diagnosis not present

## 2020-05-12 DIAGNOSIS — M00852 Arthritis due to other bacteria, left hip: Secondary | ICD-10-CM | POA: Diagnosis not present

## 2020-05-12 DIAGNOSIS — M7062 Trochanteric bursitis, left hip: Secondary | ICD-10-CM | POA: Diagnosis not present

## 2020-05-12 DIAGNOSIS — I700 Atherosclerosis of aorta: Secondary | ICD-10-CM | POA: Diagnosis not present

## 2020-05-12 DIAGNOSIS — F419 Anxiety disorder, unspecified: Secondary | ICD-10-CM | POA: Diagnosis not present

## 2020-05-12 DIAGNOSIS — M5136 Other intervertebral disc degeneration, lumbar region: Secondary | ICD-10-CM | POA: Diagnosis not present

## 2020-05-12 DIAGNOSIS — G834 Cauda equina syndrome: Secondary | ICD-10-CM | POA: Diagnosis not present

## 2020-05-12 DIAGNOSIS — Q6100 Congenital renal cyst, unspecified: Secondary | ICD-10-CM | POA: Diagnosis not present

## 2020-05-12 DIAGNOSIS — T8452XA Infection and inflammatory reaction due to internal left hip prosthesis, initial encounter: Secondary | ICD-10-CM | POA: Diagnosis not present

## 2020-05-12 DIAGNOSIS — M4628 Osteomyelitis of vertebra, sacral and sacrococcygeal region: Secondary | ICD-10-CM | POA: Diagnosis not present

## 2020-05-14 DIAGNOSIS — M48061 Spinal stenosis, lumbar region without neurogenic claudication: Secondary | ICD-10-CM | POA: Diagnosis not present

## 2020-05-14 DIAGNOSIS — M4646 Discitis, unspecified, lumbar region: Secondary | ICD-10-CM | POA: Diagnosis not present

## 2020-05-14 DIAGNOSIS — M00852 Arthritis due to other bacteria, left hip: Secondary | ICD-10-CM | POA: Diagnosis not present

## 2020-05-14 DIAGNOSIS — M4628 Osteomyelitis of vertebra, sacral and sacrococcygeal region: Secondary | ICD-10-CM | POA: Diagnosis not present

## 2020-05-14 DIAGNOSIS — T8452XA Infection and inflammatory reaction due to internal left hip prosthesis, initial encounter: Secondary | ICD-10-CM | POA: Diagnosis not present

## 2020-05-14 DIAGNOSIS — M4056 Lordosis, unspecified, lumbar region: Secondary | ICD-10-CM | POA: Diagnosis not present

## 2020-05-14 DIAGNOSIS — D1809 Hemangioma of other sites: Secondary | ICD-10-CM | POA: Diagnosis not present

## 2020-05-14 DIAGNOSIS — G834 Cauda equina syndrome: Secondary | ICD-10-CM | POA: Diagnosis not present

## 2020-05-14 DIAGNOSIS — R7881 Bacteremia: Secondary | ICD-10-CM | POA: Diagnosis not present

## 2020-05-14 DIAGNOSIS — M7062 Trochanteric bursitis, left hip: Secondary | ICD-10-CM | POA: Diagnosis not present

## 2020-05-14 DIAGNOSIS — I700 Atherosclerosis of aorta: Secondary | ICD-10-CM | POA: Diagnosis not present

## 2020-05-14 DIAGNOSIS — M5136 Other intervertebral disc degeneration, lumbar region: Secondary | ICD-10-CM | POA: Diagnosis not present

## 2020-05-14 DIAGNOSIS — Q6100 Congenital renal cyst, unspecified: Secondary | ICD-10-CM | POA: Diagnosis not present

## 2020-05-14 DIAGNOSIS — M6008 Infective myositis, other site: Secondary | ICD-10-CM | POA: Diagnosis not present

## 2020-05-14 DIAGNOSIS — M47816 Spondylosis without myelopathy or radiculopathy, lumbar region: Secondary | ICD-10-CM | POA: Diagnosis not present

## 2020-05-14 DIAGNOSIS — F419 Anxiety disorder, unspecified: Secondary | ICD-10-CM | POA: Diagnosis not present

## 2020-05-14 DIAGNOSIS — E871 Hypo-osmolality and hyponatremia: Secondary | ICD-10-CM | POA: Diagnosis not present

## 2020-05-14 DIAGNOSIS — F329 Major depressive disorder, single episode, unspecified: Secondary | ICD-10-CM | POA: Diagnosis not present

## 2020-05-19 DIAGNOSIS — E871 Hypo-osmolality and hyponatremia: Secondary | ICD-10-CM | POA: Diagnosis not present

## 2020-05-19 DIAGNOSIS — Q6100 Congenital renal cyst, unspecified: Secondary | ICD-10-CM | POA: Diagnosis not present

## 2020-05-19 DIAGNOSIS — M4628 Osteomyelitis of vertebra, sacral and sacrococcygeal region: Secondary | ICD-10-CM | POA: Diagnosis not present

## 2020-05-19 DIAGNOSIS — M7062 Trochanteric bursitis, left hip: Secondary | ICD-10-CM | POA: Diagnosis not present

## 2020-05-19 DIAGNOSIS — M48061 Spinal stenosis, lumbar region without neurogenic claudication: Secondary | ICD-10-CM | POA: Diagnosis not present

## 2020-05-19 DIAGNOSIS — F329 Major depressive disorder, single episode, unspecified: Secondary | ICD-10-CM | POA: Diagnosis not present

## 2020-05-19 DIAGNOSIS — T8452XA Infection and inflammatory reaction due to internal left hip prosthesis, initial encounter: Secondary | ICD-10-CM | POA: Diagnosis not present

## 2020-05-19 DIAGNOSIS — D1809 Hemangioma of other sites: Secondary | ICD-10-CM | POA: Diagnosis not present

## 2020-05-19 DIAGNOSIS — I700 Atherosclerosis of aorta: Secondary | ICD-10-CM | POA: Diagnosis not present

## 2020-05-19 DIAGNOSIS — M47816 Spondylosis without myelopathy or radiculopathy, lumbar region: Secondary | ICD-10-CM | POA: Diagnosis not present

## 2020-05-19 DIAGNOSIS — M6008 Infective myositis, other site: Secondary | ICD-10-CM | POA: Diagnosis not present

## 2020-05-19 DIAGNOSIS — M5136 Other intervertebral disc degeneration, lumbar region: Secondary | ICD-10-CM | POA: Diagnosis not present

## 2020-05-19 DIAGNOSIS — M4056 Lordosis, unspecified, lumbar region: Secondary | ICD-10-CM | POA: Diagnosis not present

## 2020-05-19 DIAGNOSIS — F419 Anxiety disorder, unspecified: Secondary | ICD-10-CM | POA: Diagnosis not present

## 2020-05-19 DIAGNOSIS — G834 Cauda equina syndrome: Secondary | ICD-10-CM | POA: Diagnosis not present

## 2020-05-19 DIAGNOSIS — M00852 Arthritis due to other bacteria, left hip: Secondary | ICD-10-CM | POA: Diagnosis not present

## 2020-05-20 ENCOUNTER — Other Ambulatory Visit: Payer: Self-pay

## 2020-05-20 ENCOUNTER — Ambulatory Visit (INDEPENDENT_AMBULATORY_CARE_PROVIDER_SITE_OTHER): Payer: BC Managed Care – PPO | Admitting: Internal Medicine

## 2020-05-20 ENCOUNTER — Encounter: Payer: Self-pay | Admitting: Internal Medicine

## 2020-05-20 DIAGNOSIS — T8452XD Infection and inflammatory reaction due to internal left hip prosthesis, subsequent encounter: Secondary | ICD-10-CM | POA: Diagnosis not present

## 2020-05-20 MED ORDER — CEPHALEXIN 500 MG PO CAPS: 500.0000 mg | ORAL_CAPSULE | Freq: Three times a day (TID) | ORAL | 11 refills | Status: DC

## 2020-05-20 MED ORDER — RIFAMPIN 300 MG PO CAPS: 600.0000 mg | ORAL_CAPSULE | Freq: Every day | ORAL | 11 refills | Status: AC

## 2020-05-20 NOTE — Addendum Note (Signed)
Addended by: Michel Bickers on: 05/20/2020 04:33 PM   Modules accepted: Orders, Level of Service

## 2020-05-20 NOTE — Progress Notes (Signed)
[name] for Infectious Disease  Patient Active Problem List   Diagnosis Date Noted  . Infection of left prosthetic hip joint ([place]) 04/11/2020    Priority: High  . MSSA bacteremia 04/11/2020    Priority: High  . Acute low back pain 04/11/2020    Priority: High  . H/O MSSA epidural abscess, L2-L5 07/25/2019    Priority: High  . Adjustment disorder with depressed mood 11/19/2019  . Left hip pain 10/09/2019  . Blurry vision, left eye 10/09/2019  . Hyponatremia 08/20/2019  . S/P lumbar laminectomy 07/25/2019  . Anemia 07/18/2019  . Obesity (BMI 35.0-39.9 without comorbidity) 07/18/2019  . Constipation 07/18/2019  . Elevated blood sugar 04/01/2017  . Alcohol abuse, in remission 10/12/2016  . S/P total hip arthroplasty 10/12/2016    Patient's Medications  New Prescriptions   No medications on file  Previous Medications   ASPIRIN EC 81 MG TABLET    Take 1 tablet (81 mg total) by mouth 2 (two) times daily as needed.   CEPHALEXIN (KEFLEX) 500 MG CAPSULE    Take 1 capsule (500 mg total) by mouth 3 (three) times daily.   CYCLOBENZAPRINE (FLEXERIL) 10 MG TABLET    TAKE 1 TABLET BY MOUTH THREE TIMES A DAY AS NEEDED FOR MUSCLE SPASMS   ENOXAPARIN (LOVENOX) 40 MG/0.4ML INJECTION    Inject 0.4 mLs (40 mg total) into the skin daily.   MELOXICAM (MOBIC) 7.5 MG TABLET    1 tab po daily prn pain   METHOCARBAMOL (ROBAXIN) 500 MG TABLET    Take 1 tablet (500 mg total) by mouth 2 (two) times daily as needed.   OMEPRAZOLE (PRILOSEC OTC) 20 MG TABLET    Take 20 mg by mouth daily as needed (heart burn).   OXYCODONE-ACETAMINOPHEN (PERCOCET) 5-325 MG TABLET    Take 1-2 tablets by mouth every 8 (eight) hours as needed for severe pain.   POLYETHYLENE GLYCOL (MIRALAX / GLYCOLAX) 17 G PACKET    Take 17 g by mouth 2 (two) times daily.   SENNA-DOCUSATE (SENOKOT-S) 8.6-50 MG TABLET    Take 1 tablet by mouth 2 (two) times daily.  Modified Medications   Modified Medication Previous Medication    RIFAMPIN (RIFADIN) 300 MG CAPSULE rifampin (RIFADIN) 300 MG capsule      Take 2 capsules (600 mg total) by mouth daily.    Take 2 capsules (600 mg total) by mouth daily.  Discontinued Medications   No medications on file    Subjective: [name] is in for his hospital follow-up visit.  He had MSSA bacteremia and lumbar infection late last December.  He has a prosthetic left hip.  Hip aspirate Gram stain and cultures were negative.  He underwent lumbar decompression and laminectomy followed by 54 days of IV antibiotic therapy completing treatment on 09/17/2019. His infection appeared to have been cured. He did well and was slowly improving until he woke up on the morning of 04/05/2020 with severe left hip pain. He underwent MRI scan which showed a left acetabular osteomyelitis, adjacent myositis and a small iliac is abscess. Repeat blood cultures are growing MSSA again. He has also developed acute severe low back pain again.  He underwent incision and drainage of his left hip.  PICC was placed and he was discharged on IV cefazolin and oral rifampin.  He has now completed 6 weeks of therapy.  He is still having left hip pain but it has improved and he is feeling better.  He is working with physical therapy.  He has not had any problems tolerating his antibiotics for PICC.  Review of Systems: Review of Systems  Constitutional: Negative for chills, diaphoresis and fever.  Gastrointestinal: Negative for abdominal pain, diarrhea, nausea and vomiting.  Musculoskeletal: Positive for back pain and joint pain.  Skin: Negative for rash.    Past Medical History:  Diagnosis Date  . Cauda equina syndrome ([place]) 07/31/2019  . Cerebral septic emboli ([place]) 07/30/2019  . History of chicken pox   . Medical history non-contributory     Social History   Tobacco Use  . Smoking status: Former Smoker    Packs/day: 1.00    Years: 14.00    Pack years: 14.00    Types: Cigarettes    Quit date: 07/27/1999    Years since  quitting: 20.8  . Smokeless tobacco: Never Used  Vaping Use  . Vaping Use: Never used  Substance Use Topics  . Alcohol use: Yes    Alcohol/week: 24.0 standard drinks    Types: 24 Cans of beer per week    Comment: 4-5 beers per day, case of beer per week  . Drug use: No    Family History  Problem Relation Age of Onset  . Cancer Father        Hodgkin's disease  . COPD Mother   . Heart attack Maternal Grandfather 80  . Diabetes Neg Hx   . Stroke Neg Hx   . Hypertension Neg Hx   . Hyperlipidemia Neg Hx     Allergies  Allergen Reactions  . Bee Venom Anaphylaxis  . Hydrocodone Nausea And Vomiting    (12/10/19 - pt says he has taken without issues)    Objective: Vitals:   05/20/20 1607  BP: 108/72  Temp: (!) 90 F (32.2 C)  Weight: 230 lb (104.3 kg)  Height: 6' (1.829 m)   Body mass index is 31.19 kg/m.  Physical Exam Constitutional:      Comments: He remains very concerned about why the staph infection came back and what he needs to do to make sure that it never comes back again.  Cardiovascular:     Rate and Rhythm: Normal rate and regular rhythm.     Heart sounds: No murmur heard.   Pulmonary:     Effort: Pulmonary effort is normal.     Breath sounds: Normal breath sounds.  Musculoskeletal:     Comments: His left hip incision has healed nicely.  Skin:    Comments: His PICC site looks good.       Problem List Items Addressed This Visit      High   Infection of left prosthetic hip joint ([place])    He is improving slowly on antibiotic therapy for a late relapse of MSSA infection now involving his prosthetic left hip.  I will have his PICC removed and change IV cefazolin to oral cephalexin which she will take along with oral rifampin.  He knows to let me know if he develops diarrhea.  He will follow-up here in 2 months.          Michel Bickers, MD [facility] for [facility] [phone] pager   [phone] cell 05/20/2020,  4:39 PM

## 2020-05-20 NOTE — Progress Notes (Signed)
Opened in error

## 2020-05-20 NOTE — Progress Notes (Signed)
Call placed to [facility], spoke with [name] and gave verbal orders per [name] to pull picc line. Verbal order read back and understood. [name] will contact [name] team to make them aware. Patient made aware during visit. [name]

## 2020-05-20 NOTE — Assessment & Plan Note (Signed)
He is improving slowly on antibiotic therapy for a late relapse of MSSA infection now involving his prosthetic left hip.  I will have his PICC removed and change IV cefazolin to oral cephalexin which she will take along with oral rifampin.  He knows to let me know if he develops diarrhea.  He will follow-up here in 2 months.

## 2020-05-21 ENCOUNTER — Encounter: Payer: Self-pay | Admitting: Orthopaedic Surgery

## 2020-05-21 ENCOUNTER — Telehealth: Payer: Self-pay | Admitting: Orthopaedic Surgery

## 2020-05-21 ENCOUNTER — Ambulatory Visit (INDEPENDENT_AMBULATORY_CARE_PROVIDER_SITE_OTHER): Payer: BC Managed Care – PPO | Admitting: Orthopaedic Surgery

## 2020-05-21 ENCOUNTER — Ambulatory Visit (INDEPENDENT_AMBULATORY_CARE_PROVIDER_SITE_OTHER): Payer: BC Managed Care – PPO

## 2020-05-21 DIAGNOSIS — T8452XD Infection and inflammatory reaction due to internal left hip prosthesis, subsequent encounter: Secondary | ICD-10-CM

## 2020-05-21 DIAGNOSIS — M6008 Infective myositis, other site: Secondary | ICD-10-CM | POA: Diagnosis not present

## 2020-05-21 DIAGNOSIS — M00852 Arthritis due to other bacteria, left hip: Secondary | ICD-10-CM | POA: Diagnosis not present

## 2020-05-21 DIAGNOSIS — M47816 Spondylosis without myelopathy or radiculopathy, lumbar region: Secondary | ICD-10-CM | POA: Diagnosis not present

## 2020-05-21 DIAGNOSIS — M7062 Trochanteric bursitis, left hip: Secondary | ICD-10-CM | POA: Diagnosis not present

## 2020-05-21 DIAGNOSIS — I700 Atherosclerosis of aorta: Secondary | ICD-10-CM | POA: Diagnosis not present

## 2020-05-21 DIAGNOSIS — M25552 Pain in left hip: Secondary | ICD-10-CM

## 2020-05-21 DIAGNOSIS — M5136 Other intervertebral disc degeneration, lumbar region: Secondary | ICD-10-CM | POA: Diagnosis not present

## 2020-05-21 DIAGNOSIS — Q6100 Congenital renal cyst, unspecified: Secondary | ICD-10-CM | POA: Diagnosis not present

## 2020-05-21 DIAGNOSIS — F419 Anxiety disorder, unspecified: Secondary | ICD-10-CM | POA: Diagnosis not present

## 2020-05-21 DIAGNOSIS — F329 Major depressive disorder, single episode, unspecified: Secondary | ICD-10-CM | POA: Diagnosis not present

## 2020-05-21 DIAGNOSIS — D1809 Hemangioma of other sites: Secondary | ICD-10-CM | POA: Diagnosis not present

## 2020-05-21 DIAGNOSIS — G834 Cauda equina syndrome: Secondary | ICD-10-CM | POA: Diagnosis not present

## 2020-05-21 DIAGNOSIS — E871 Hypo-osmolality and hyponatremia: Secondary | ICD-10-CM | POA: Diagnosis not present

## 2020-05-21 DIAGNOSIS — Z96642 Presence of left artificial hip joint: Secondary | ICD-10-CM

## 2020-05-21 DIAGNOSIS — T8452XA Infection and inflammatory reaction due to internal left hip prosthesis, initial encounter: Secondary | ICD-10-CM | POA: Diagnosis not present

## 2020-05-21 DIAGNOSIS — M4628 Osteomyelitis of vertebra, sacral and sacrococcygeal region: Secondary | ICD-10-CM | POA: Diagnosis not present

## 2020-05-21 DIAGNOSIS — M4056 Lordosis, unspecified, lumbar region: Secondary | ICD-10-CM | POA: Diagnosis not present

## 2020-05-21 DIAGNOSIS — M48061 Spinal stenosis, lumbar region without neurogenic claudication: Secondary | ICD-10-CM | POA: Diagnosis not present

## 2020-05-21 NOTE — Telephone Encounter (Signed)
Patient called. He would like a temporary handicap place card. His call back number is [phone]

## 2020-05-21 NOTE — Telephone Encounter (Signed)
Ready for pick up. Patient aware.  

## 2020-05-21 NOTE — Telephone Encounter (Signed)
If yes, for how long?  

## 2020-05-21 NOTE — Telephone Encounter (Signed)
6 months

## 2020-05-21 NOTE — Progress Notes (Signed)
Post-Op Visit Note   Patient: [name]           Date of Birth: 10/05/1965           MRN: [id number] Visit Date: 05/21/2020 PCP: Lesleigh Noe, MD   Assessment & Plan:  Chief Complaint:  Chief Complaint  Patient presents with  . Left Hip - Pain   Visit Diagnoses:  1. Pain in left hip   2. 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 Status post total replacement of left hip     Plan: Patient is a pleasant 54 year old gentleman who comes in today approximately 6 weeks out left hip irrigation debridement from a prosthetic hip infection 04/11/2020.  He is doing much better, but still notes moderate pain with movement of the hip.  He saw [name] yesterday where his PICC line was removed.  He is now on oral Keflex.  Examination of his left hip reveals somewhat painful logroll and hip flexion.  He is neurovascular intact distally.  At this point, he will continue posterior hip precautions for another 6 weeks.  He will follow-up with [place] in 6 weeks for repeat evaluation.  I have agreed to call and another prescription of Norco when he runs out.  He will call and let us know.  Follow-Up Instructions: Return in about 6 weeks (around 07/02/2020).   Orders:  Orders Placed This Encounter  Procedures  . XR Pelvis 1-2 Views   No orders of the defined types were placed in this encounter.   Imaging: XR Pelvis 1-2 Views  Result Date: 05/21/2020 Well-seated prosthesis without complication   PMFS History: Patient Active Problem List   Diagnosis Date Noted  . Infection of left prosthetic hip joint ([place]) 04/11/2020  . MSSA bacteremia 04/11/2020  . Acute low back pain 04/11/2020  . Adjustment disorder with depressed mood 11/19/2019  . Left hip pain 10/09/2019  . Blurry vision, left eye 10/09/2019  . Hyponatremia 08/20/2019  . S/P lumbar laminectomy 07/25/2019  . H/O MSSA epidural abscess, L2-L5 07/25/2019  . Anemia 07/18/2019  . Obesity  (BMI 35.0-39.9 without comorbidity) 07/18/2019  . Constipation 07/18/2019  . Elevated blood sugar 04/01/2017  . Alcohol abuse, in remission 10/12/2016  . S/P total hip arthroplasty 10/12/2016   Past Medical History:  Diagnosis Date  . Cauda equina syndrome ([place]) 07/31/2019  . Cerebral septic emboli ([place]) 07/30/2019  . History of chicken pox   . Medical history non-contributory     Family History  Problem Relation Age of Onset  . Cancer Father        Hodgkin's disease  . COPD Mother   . Heart attack Maternal Grandfather 80  . Diabetes Neg Hx   . Stroke Neg Hx   . Hypertension Neg Hx   . Hyperlipidemia Neg Hx     Past Surgical History:  Procedure Laterality Date  . CARPAL TUNNEL RELEASE Bilateral 2009  . CATARACT EXTRACTION W/PHACO Left 12/17/2019   Procedure: CATARACT EXTRACTION PHACO AND INTRAOCULAR LENS PLACEMENT (IOC) LEFT;  Surgeon: Eulogio Bear, MD;  Location: [facility];  Service: Ophthalmology;  Laterality: Left;  3.61 0:26.9  . CATARACT EXTRACTION W/PHACO Right 03/17/2020   Procedure: CATARACT EXTRACTION PHACO AND INTRAOCULAR LENS PLACEMENT (IOC) RIGHT 4.41  00:28.1;  Surgeon: Eulogio Bear, MD;  Location: [facility];  Service: Ophthalmology;  Laterality: Right;  . COLONOSCOPY W/ POLYPECTOMY    . IR FLUORO GUIDED NEEDLE PLC ASPIRATION/INJECTION LOC  04/10/2020  . JOINT REPLACEMENT    . LACERATION REPAIR Right ~ 2012   leg  . LUMBAR LAMINECTOMY/DECOMPRESSION MICRODISCECTOMY N/A 07/24/2019   Procedure: Decompressive Lumbar Laminectomy Lumbar three-four Lumbar four-five for Epidural Abscess;  Surgeon: Kary Kos, MD;  Location: [place];  Service: Neurosurgery;  Laterality: N/A;  . TOTAL HIP ARTHROPLASTY Right 01/11/2013   Procedure: TOTAL HIP ARTHROPLASTY;  Surgeon: Garald Balding, MD;  Location: [facility] [place];  Service: Orthopedics;  Laterality: Right;  . TOTAL HIP ARTHROPLASTY Left 10/12/2016   Procedure: TOTAL HIP ARTHROPLASTY;  Surgeon: Garald Balding, MD;  Location: [facility];  Service: Orthopedics;  Laterality: Left;  . TOTAL HIP ARTHROPLASTY Left 04/11/2020   Procedure: LEFT HIP IRRIGATION AND DEBRIDEMENT WITH HEAD AND POLY SWAP;  Surgeon: Leandrew Koyanagi, MD;  Location: [facility];  Service: Orthopedics;  Laterality: Left;   Social History   Occupational History  . 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 Smoking status: Former Smoker    Packs/day: 1.00    Years: 14.00    Pack years: 14.00    Types: Cigarettes    Quit date: 07/27/1999    Years since quitting: 20.8  . Smokeless tobacco: Never Used  Vaping Use  . Vaping Use: Never used  Substance and Sexual Activity  . Alcohol use: Yes    Alcohol/week: 24.0 standard drinks    Types: 24 Cans of beer per week    Comment: 4-5 beers per day, case of beer per week  . Drug use: No  . Sexual activity: Yes    Birth control/protection: Post-menopausal

## 2020-05-22 ENCOUNTER — Other Ambulatory Visit: Payer: Self-pay | Admitting: Physician Assistant

## 2020-05-22 DIAGNOSIS — M6008 Infective myositis, other site: Secondary | ICD-10-CM | POA: Diagnosis not present

## 2020-05-22 DIAGNOSIS — F329 Major depressive disorder, single episode, unspecified: Secondary | ICD-10-CM | POA: Diagnosis not present

## 2020-05-22 DIAGNOSIS — M48061 Spinal stenosis, lumbar region without neurogenic claudication: Secondary | ICD-10-CM | POA: Diagnosis not present

## 2020-05-22 DIAGNOSIS — M00852 Arthritis due to other bacteria, left hip: Secondary | ICD-10-CM | POA: Diagnosis not present

## 2020-05-22 DIAGNOSIS — M7062 Trochanteric bursitis, left hip: Secondary | ICD-10-CM | POA: Diagnosis not present

## 2020-05-22 DIAGNOSIS — M4056 Lordosis, unspecified, lumbar region: Secondary | ICD-10-CM | POA: Diagnosis not present

## 2020-05-22 DIAGNOSIS — T8452XA Infection and inflammatory reaction due to internal left hip prosthesis, initial encounter: Secondary | ICD-10-CM | POA: Diagnosis not present

## 2020-05-22 DIAGNOSIS — E871 Hypo-osmolality and hyponatremia: Secondary | ICD-10-CM | POA: Diagnosis not present

## 2020-05-22 DIAGNOSIS — M47816 Spondylosis without myelopathy or radiculopathy, lumbar region: Secondary | ICD-10-CM | POA: Diagnosis not present

## 2020-05-22 DIAGNOSIS — D1809 Hemangioma of other sites: Secondary | ICD-10-CM | POA: Diagnosis not present

## 2020-05-22 DIAGNOSIS — F419 Anxiety disorder, unspecified: Secondary | ICD-10-CM | POA: Diagnosis not present

## 2020-05-22 DIAGNOSIS — Q6100 Congenital renal cyst, unspecified: Secondary | ICD-10-CM | POA: Diagnosis not present

## 2020-05-22 DIAGNOSIS — M4628 Osteomyelitis of vertebra, sacral and sacrococcygeal region: Secondary | ICD-10-CM | POA: Diagnosis not present

## 2020-05-22 DIAGNOSIS — G834 Cauda equina syndrome: Secondary | ICD-10-CM | POA: Diagnosis not present

## 2020-05-22 DIAGNOSIS — I700 Atherosclerosis of aorta: Secondary | ICD-10-CM | POA: Diagnosis not present

## 2020-05-22 DIAGNOSIS — M5136 Other intervertebral disc degeneration, lumbar region: Secondary | ICD-10-CM | POA: Diagnosis not present

## 2020-05-26 ENCOUNTER — Telehealth: Payer: Self-pay | Admitting: Orthopaedic Surgery

## 2020-05-26 NOTE — Telephone Encounter (Signed)
Patient called requesting a refill of oxycodone. Please send to pharmacy on file. Patient phone number is [phone]

## 2020-05-27 ENCOUNTER — Encounter: Payer: Self-pay | Admitting: Physician Assistant

## 2020-05-27 ENCOUNTER — Other Ambulatory Visit: Payer: Self-pay | Admitting: Physician Assistant

## 2020-05-27 DIAGNOSIS — M5136 Other intervertebral disc degeneration, lumbar region: Secondary | ICD-10-CM | POA: Diagnosis not present

## 2020-05-27 DIAGNOSIS — I700 Atherosclerosis of aorta: Secondary | ICD-10-CM | POA: Diagnosis not present

## 2020-05-27 DIAGNOSIS — E871 Hypo-osmolality and hyponatremia: Secondary | ICD-10-CM | POA: Diagnosis not present

## 2020-05-27 DIAGNOSIS — Q6100 Congenital renal cyst, unspecified: Secondary | ICD-10-CM | POA: Diagnosis not present

## 2020-05-27 DIAGNOSIS — F329 Major depressive disorder, single episode, unspecified: Secondary | ICD-10-CM | POA: Diagnosis not present

## 2020-05-27 DIAGNOSIS — T8452XA Infection and inflammatory reaction due to internal left hip prosthesis, initial encounter: Secondary | ICD-10-CM | POA: Diagnosis not present

## 2020-05-27 DIAGNOSIS — M4056 Lordosis, unspecified, lumbar region: Secondary | ICD-10-CM | POA: Diagnosis not present

## 2020-05-27 DIAGNOSIS — M47816 Spondylosis without myelopathy or radiculopathy, lumbar region: Secondary | ICD-10-CM | POA: Diagnosis not present

## 2020-05-27 DIAGNOSIS — M4628 Osteomyelitis of vertebra, sacral and sacrococcygeal region: Secondary | ICD-10-CM | POA: Diagnosis not present

## 2020-05-27 DIAGNOSIS — M7062 Trochanteric bursitis, left hip: Secondary | ICD-10-CM | POA: Diagnosis not present

## 2020-05-27 DIAGNOSIS — M6008 Infective myositis, other site: Secondary | ICD-10-CM | POA: Diagnosis not present

## 2020-05-27 DIAGNOSIS — M48061 Spinal stenosis, lumbar region without neurogenic claudication: Secondary | ICD-10-CM | POA: Diagnosis not present

## 2020-05-27 DIAGNOSIS — G834 Cauda equina syndrome: Secondary | ICD-10-CM | POA: Diagnosis not present

## 2020-05-27 DIAGNOSIS — M00852 Arthritis due to other bacteria, left hip: Secondary | ICD-10-CM | POA: Diagnosis not present

## 2020-05-27 DIAGNOSIS — F419 Anxiety disorder, unspecified: Secondary | ICD-10-CM | POA: Diagnosis not present

## 2020-05-27 DIAGNOSIS — D1809 Hemangioma of other sites: Secondary | ICD-10-CM | POA: Diagnosis not present

## 2020-05-27 MED ORDER — OXYCODONE-ACETAMINOPHEN 5-325 MG PO TABS: 1.0000 | ORAL_TABLET | Freq: Two times a day (BID) | ORAL | 0 refills | Status: DC | PRN

## 2020-05-27 NOTE — Telephone Encounter (Signed)
I sent in oxy as he is allergic to norco.  He needs to be aware that he is about 6 weeks out and will need to be weaned off of narcotics soon

## 2020-06-05 DIAGNOSIS — D1809 Hemangioma of other sites: Secondary | ICD-10-CM | POA: Diagnosis not present

## 2020-06-05 DIAGNOSIS — M6008 Infective myositis, other site: Secondary | ICD-10-CM | POA: Diagnosis not present

## 2020-06-05 DIAGNOSIS — M5136 Other intervertebral disc degeneration, lumbar region: Secondary | ICD-10-CM | POA: Diagnosis not present

## 2020-06-05 DIAGNOSIS — T8452XA Infection and inflammatory reaction due to internal left hip prosthesis, initial encounter: Secondary | ICD-10-CM | POA: Diagnosis not present

## 2020-06-05 DIAGNOSIS — M7062 Trochanteric bursitis, left hip: Secondary | ICD-10-CM | POA: Diagnosis not present

## 2020-06-05 DIAGNOSIS — E871 Hypo-osmolality and hyponatremia: Secondary | ICD-10-CM | POA: Diagnosis not present

## 2020-06-05 DIAGNOSIS — F419 Anxiety disorder, unspecified: Secondary | ICD-10-CM | POA: Diagnosis not present

## 2020-06-05 DIAGNOSIS — M4628 Osteomyelitis of vertebra, sacral and sacrococcygeal region: Secondary | ICD-10-CM | POA: Diagnosis not present

## 2020-06-05 DIAGNOSIS — G834 Cauda equina syndrome: Secondary | ICD-10-CM | POA: Diagnosis not present

## 2020-06-05 DIAGNOSIS — Q6100 Congenital renal cyst, unspecified: Secondary | ICD-10-CM | POA: Diagnosis not present

## 2020-06-05 DIAGNOSIS — F329 Major depressive disorder, single episode, unspecified: Secondary | ICD-10-CM | POA: Diagnosis not present

## 2020-06-05 DIAGNOSIS — M00852 Arthritis due to other bacteria, left hip: Secondary | ICD-10-CM | POA: Diagnosis not present

## 2020-06-05 DIAGNOSIS — M48061 Spinal stenosis, lumbar region without neurogenic claudication: Secondary | ICD-10-CM | POA: Diagnosis not present

## 2020-06-05 DIAGNOSIS — M47816 Spondylosis without myelopathy or radiculopathy, lumbar region: Secondary | ICD-10-CM | POA: Diagnosis not present

## 2020-06-05 DIAGNOSIS — I700 Atherosclerosis of aorta: Secondary | ICD-10-CM | POA: Diagnosis not present

## 2020-06-05 DIAGNOSIS — M4056 Lordosis, unspecified, lumbar region: Secondary | ICD-10-CM | POA: Diagnosis not present

## 2020-06-16 ENCOUNTER — Telehealth: Payer: Self-pay | Admitting: Orthopaedic Surgery

## 2020-06-16 NOTE — Telephone Encounter (Signed)
Ok to call in tramadol 50mg  1 every 6 hours #30

## 2020-06-16 NOTE — Telephone Encounter (Signed)
Patient called. He would like a refill on his oxycodone. His call back number is [phone]

## 2020-06-17 MED ORDER — TRAMADOL HCL 50 MG PO TABS: 50.0000 mg | ORAL_TABLET | Freq: Four times a day (QID) | ORAL | 0 refills | Status: DC | PRN

## 2020-06-17 NOTE — Telephone Encounter (Signed)
Called to pharmacy 

## 2020-06-17 NOTE — Telephone Encounter (Signed)
LMVM for patient advising.

## 2020-06-20 ENCOUNTER — Emergency Department (HOSPITAL_COMMUNITY): Payer: BC Managed Care – PPO

## 2020-06-20 ENCOUNTER — Encounter (HOSPITAL_COMMUNITY): Payer: Self-pay

## 2020-06-20 ENCOUNTER — Inpatient Hospital Stay (HOSPITAL_COMMUNITY)
Admission: EM | Admit: 2020-06-20 | Discharge: 2020-06-22 | DRG: 467 | Disposition: A | Discharge status: Home / Self Care | Payer: BC Managed Care – PPO | Attending: Orthopaedic Surgery | Admitting: Orthopaedic Surgery

## 2020-06-20 ENCOUNTER — Other Ambulatory Visit: Payer: Self-pay

## 2020-06-20 DIAGNOSIS — T84021A Dislocation of internal left hip prosthesis, initial encounter: Principal | ICD-10-CM | POA: Diagnosis present

## 2020-06-20 DIAGNOSIS — Z7982 Long term (current) use of aspirin: Secondary | ICD-10-CM

## 2020-06-20 DIAGNOSIS — Z87891 Personal history of nicotine dependence: Secondary | ICD-10-CM

## 2020-06-20 DIAGNOSIS — Z885 Allergy status to narcotic agent status: Secondary | ICD-10-CM | POA: Diagnosis not present

## 2020-06-20 DIAGNOSIS — Z20822 Contact with and (suspected) exposure to covid-19: Secondary | ICD-10-CM | POA: Diagnosis present

## 2020-06-20 DIAGNOSIS — D62 Acute posthemorrhagic anemia: Secondary | ICD-10-CM | POA: Diagnosis not present

## 2020-06-20 DIAGNOSIS — E871 Hypo-osmolality and hyponatremia: Secondary | ICD-10-CM | POA: Diagnosis not present

## 2020-06-20 DIAGNOSIS — Z79899 Other long term (current) drug therapy: Secondary | ICD-10-CM | POA: Diagnosis not present

## 2020-06-20 DIAGNOSIS — G834 Cauda equina syndrome: Secondary | ICD-10-CM | POA: Diagnosis not present

## 2020-06-20 DIAGNOSIS — Z471 Aftercare following joint replacement surgery: Secondary | ICD-10-CM | POA: Diagnosis not present

## 2020-06-20 DIAGNOSIS — Z96641 Presence of right artificial hip joint: Secondary | ICD-10-CM | POA: Diagnosis present

## 2020-06-20 DIAGNOSIS — S73005A Unspecified dislocation of left hip, initial encounter: Secondary | ICD-10-CM | POA: Diagnosis not present

## 2020-06-20 DIAGNOSIS — D649 Anemia, unspecified: Secondary | ICD-10-CM | POA: Diagnosis not present

## 2020-06-20 DIAGNOSIS — Y792 Prosthetic and other implants, materials and accessory orthopedic devices associated with adverse incidents: Secondary | ICD-10-CM | POA: Diagnosis present

## 2020-06-20 DIAGNOSIS — Z91030 Bee allergy status: Secondary | ICD-10-CM

## 2020-06-20 DIAGNOSIS — Z96642 Presence of left artificial hip joint: Secondary | ICD-10-CM | POA: Diagnosis not present

## 2020-06-20 DIAGNOSIS — K5900 Constipation, unspecified: Secondary | ICD-10-CM | POA: Diagnosis not present

## 2020-06-20 DIAGNOSIS — Z96649 Presence of unspecified artificial hip joint: Secondary | ICD-10-CM

## 2020-06-20 DIAGNOSIS — T84028A Dislocation of other internal joint prosthesis, initial encounter: Secondary | ICD-10-CM | POA: Diagnosis not present

## 2020-06-20 LAB — RESP PANEL BY RT-PCR (FLU A&B, COVID) ARPGX2
Influenza A by PCR: NEGATIVE
Influenza B by PCR: NEGATIVE
SARS Coronavirus 2 by RT PCR: NEGATIVE

## 2020-06-20 IMAGING — DX DG HIP (WITH OR WITHOUT PELVIS) 2-3V*L*
4 series · 4 of 4 positions shown · non-contrast
Comparison: [DATE]

CLINICAL DATA: Possible dislocation

EXAM:
DG HIP (WITH OR WITHOUT PELVIS) 2-3V LEFT

[pelvis ap]
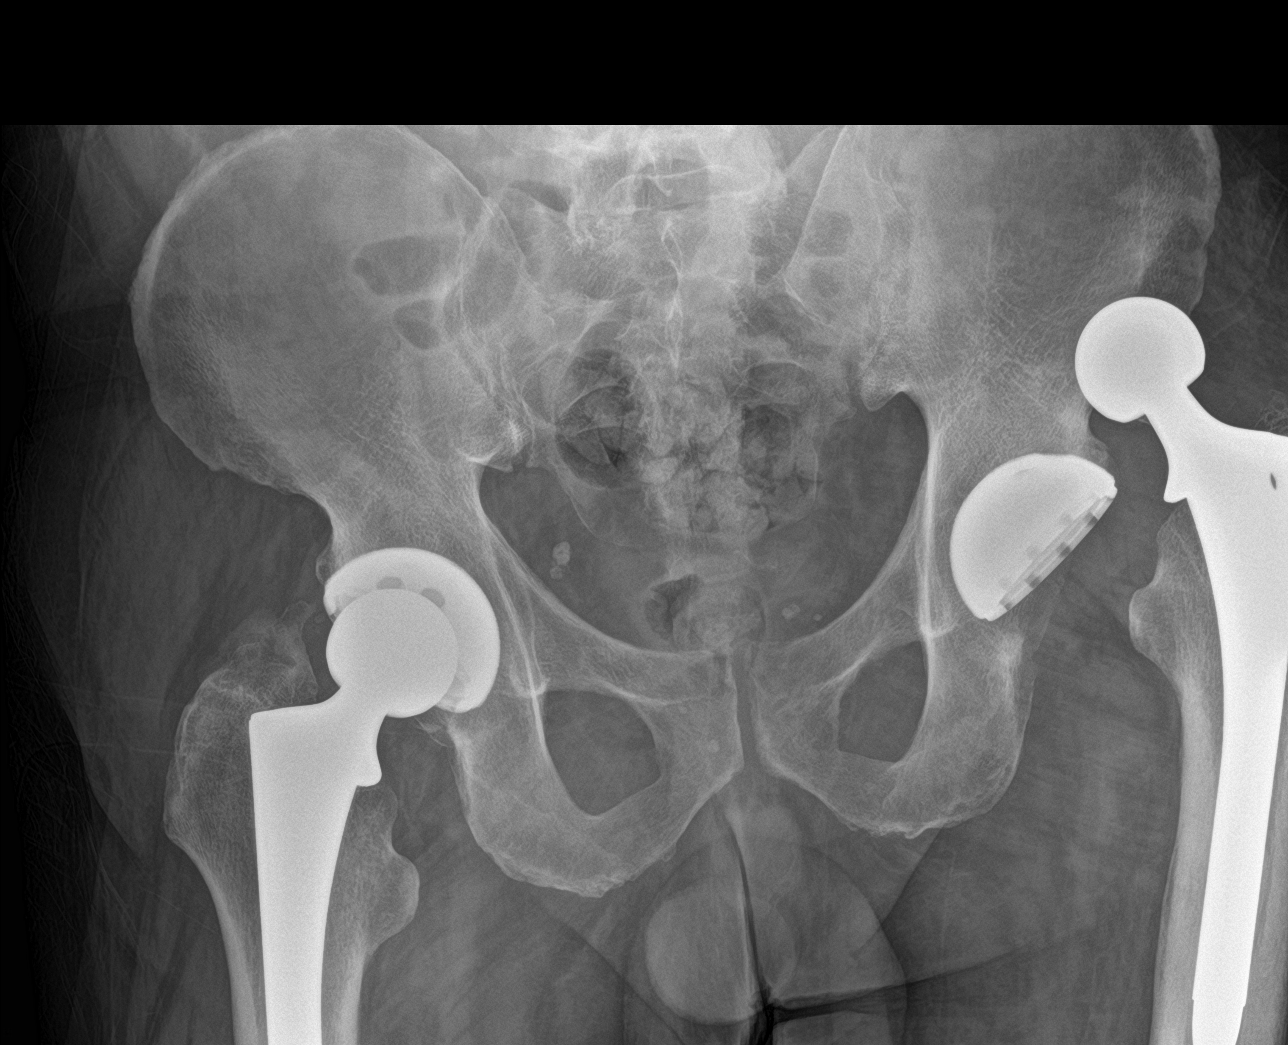

[hip ap]
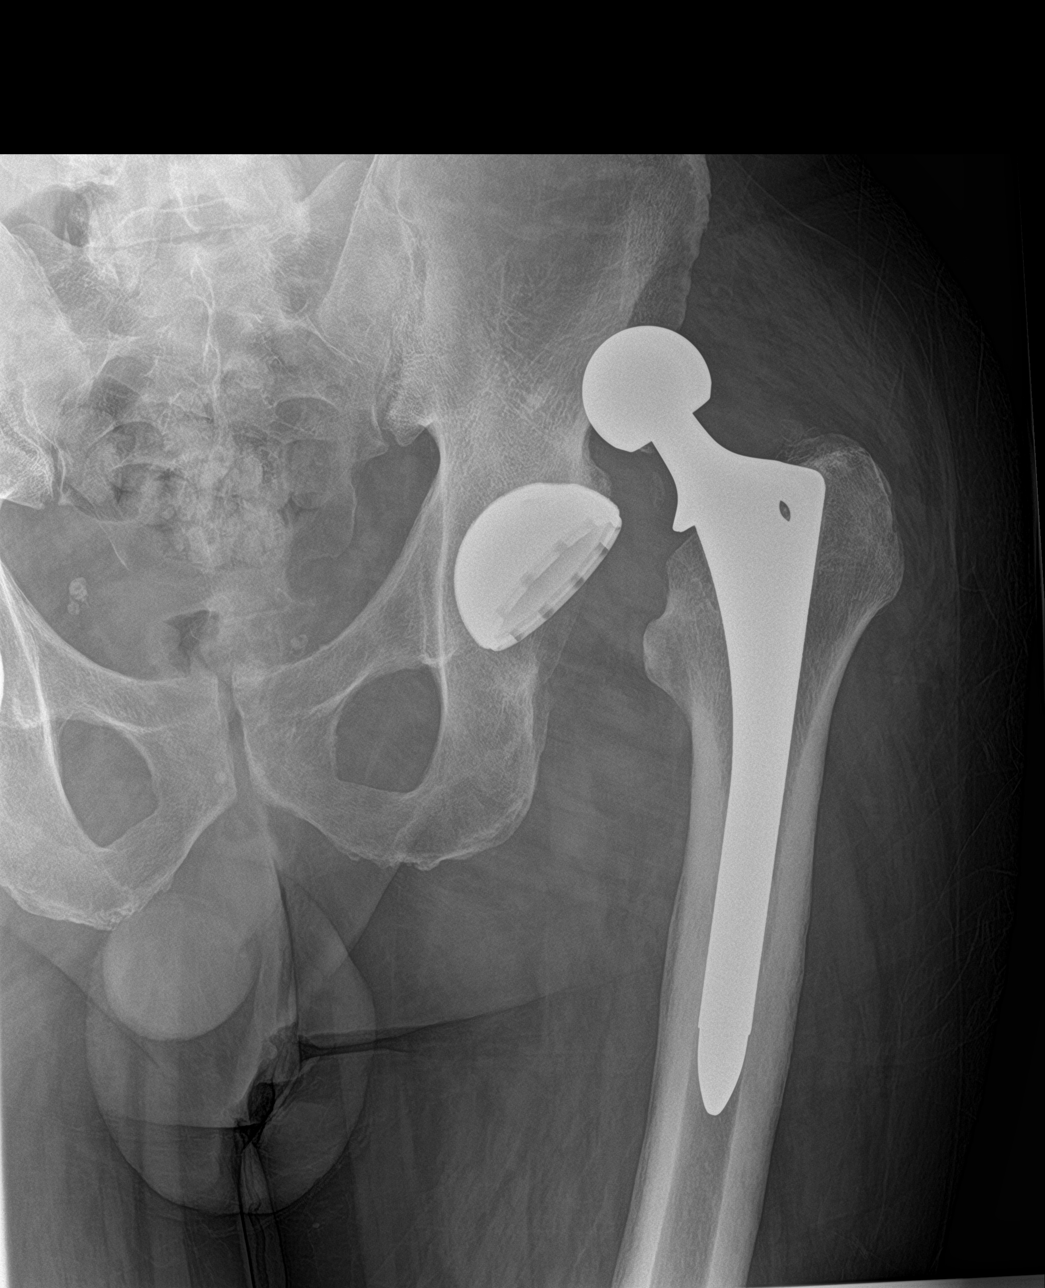

[hip lat]
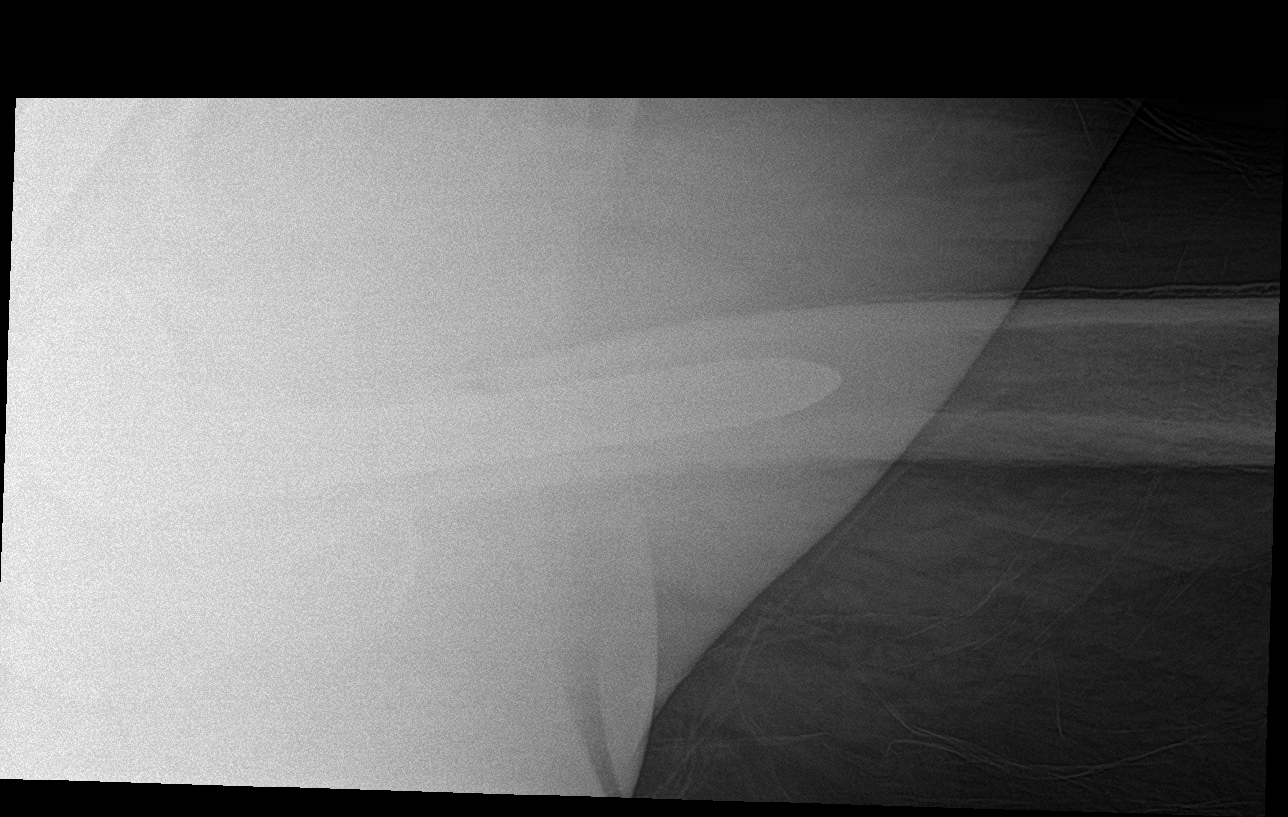

[hip x-table]
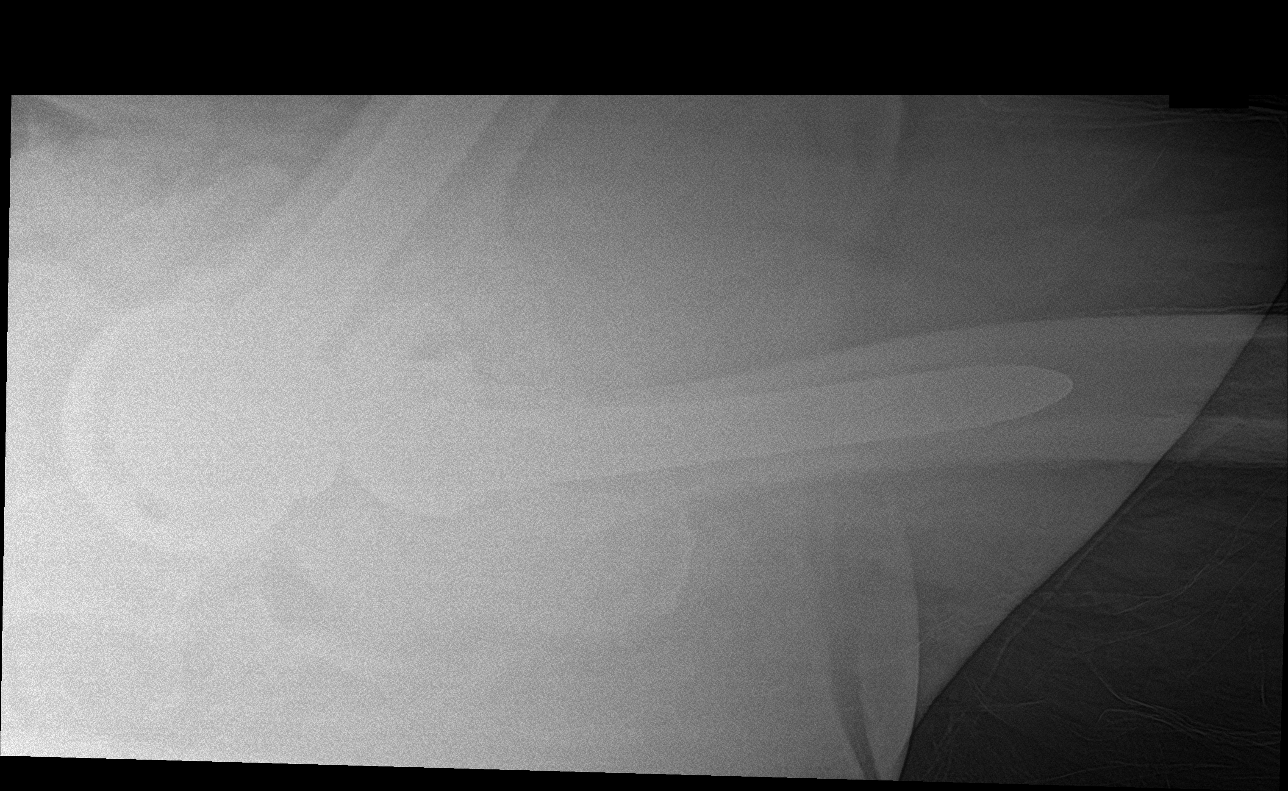

[4 of 4 positions shown; findings below may reference images not displayed]

FINDINGS: There is a left total hip arthroplasty. Dislocation of the femoral
component superolaterally with respect to the acetabular component.
No periprosthetic fracture identified. Partially imaged right total
hip arthroplasty.
IMPRESSION: Dislocation of left total hip arthroplasty.

## 2020-06-20 MED ORDER — FENTANYL CITRATE (PF) 100 MCG/2ML IJ SOLN
100.0000 ug | Freq: Once | INTRAMUSCULAR | Status: AC
  Administered 2020-06-20: 100 ug via INTRAVENOUS
  Filled 2020-06-20: qty 2

## 2020-06-20 MED ORDER — PROPOFOL 10 MG/ML IV BOLUS
0.5000 mg/kg | Freq: Once | INTRAVENOUS | Status: AC
  Administered 2020-06-20: 52.2 mg via INTRAVENOUS
  Filled 2020-06-20: qty 20

## 2020-06-20 MED ORDER — OMEPRAZOLE MAGNESIUM 20 MG PO TBEC: 20.0000 mg | DELAYED_RELEASE_TABLET | Freq: Every day | ORAL | Status: DC | PRN

## 2020-06-20 MED ORDER — ONDANSETRON HCL 4 MG PO TABS: 4.0000 mg | ORAL_TABLET | Freq: Four times a day (QID) | ORAL | Status: DC | PRN

## 2020-06-20 MED ORDER — DIPHENHYDRAMINE HCL 12.5 MG/5ML PO ELIX: 12.5000 mg | ORAL_SOLUTION | ORAL | Status: DC | PRN

## 2020-06-20 MED ORDER — METHOCARBAMOL 1000 MG/10ML IJ SOLN
500.0000 mg | Freq: Four times a day (QID) | INTRAVENOUS | Status: DC | PRN
  Filled 2020-06-20 (×2): qty 5

## 2020-06-20 MED ORDER — SODIUM CHLORIDE 0.9 % IV BOLUS
1000.0000 mL | Freq: Once | INTRAVENOUS | Status: AC
  Administered 2020-06-20: 1000 mL via INTRAVENOUS

## 2020-06-20 MED ORDER — RIFAMPIN 300 MG PO CAPS
600.0000 mg | ORAL_CAPSULE | Freq: Every day | ORAL | Status: DC
  Administered 2020-06-22: 600 mg via ORAL
  Filled 2020-06-20 (×2): qty 2

## 2020-06-20 MED ORDER — ACETAMINOPHEN 325 MG PO TABS: 325.0000 mg | ORAL_TABLET | Freq: Four times a day (QID) | ORAL | Status: DC | PRN

## 2020-06-20 MED ORDER — FENTANYL CITRATE (PF) 100 MCG/2ML IJ SOLN
150.0000 ug | Freq: Once | INTRAMUSCULAR | Status: AC
  Administered 2020-06-20: 150 ug via INTRAVENOUS
  Filled 2020-06-20: qty 4

## 2020-06-20 MED ORDER — ONDANSETRON HCL 4 MG/2ML IJ SOLN: 4.0000 mg | Freq: Four times a day (QID) | INTRAMUSCULAR | Status: DC | PRN

## 2020-06-20 MED ORDER — OXYCODONE HCL 5 MG PO TABS
10.0000 mg | ORAL_TABLET | ORAL | Status: DC | PRN
  Administered 2020-06-20 – 2020-06-21 (×3): 15 mg via ORAL
  Filled 2020-06-20 (×3): qty 3

## 2020-06-20 MED ORDER — DOCUSATE SODIUM 100 MG PO CAPS
100.0000 mg | ORAL_CAPSULE | Freq: Two times a day (BID) | ORAL | Status: DC
  Filled 2020-06-20: qty 1

## 2020-06-20 MED ORDER — METOCLOPRAMIDE HCL 10 MG PO TABS
5.0000 mg | ORAL_TABLET | Freq: Three times a day (TID) | ORAL | Status: DC | PRN
  Filled 2020-06-20: qty 1

## 2020-06-20 MED ORDER — SODIUM CHLORIDE 0.9 % IV SOLN: INTRAVENOUS | Status: DC

## 2020-06-20 MED ORDER — SORBITOL 70 % SOLN
30.0000 mL | Freq: Every day | Status: DC | PRN
  Filled 2020-06-20: qty 30

## 2020-06-20 MED ORDER — HYDROMORPHONE HCL 1 MG/ML IJ SOLN
0.5000 mg | INTRAMUSCULAR | Status: DC | PRN
  Administered 2020-06-20 (×2): 1 mg via INTRAVENOUS
  Filled 2020-06-20 (×2): qty 1

## 2020-06-20 MED ORDER — OXYCODONE HCL 5 MG PO TABS: 5.0000 mg | ORAL_TABLET | ORAL | Status: DC | PRN

## 2020-06-20 MED ORDER — ACETAMINOPHEN 500 MG PO TABS
1000.0000 mg | ORAL_TABLET | Freq: Four times a day (QID) | ORAL | Status: DC
  Administered 2020-06-20 – 2020-06-21 (×2): 1000 mg via ORAL
  Filled 2020-06-20 (×2): qty 2

## 2020-06-20 MED ORDER — METHOCARBAMOL 500 MG PO TABS
500.0000 mg | ORAL_TABLET | Freq: Four times a day (QID) | ORAL | Status: DC | PRN
  Administered 2020-06-22: 500 mg via ORAL
  Filled 2020-06-20 (×2): qty 1

## 2020-06-20 MED ORDER — METOCLOPRAMIDE HCL 5 MG/ML IJ SOLN: 5.0000 mg | Freq: Three times a day (TID) | INTRAMUSCULAR | Status: DC | PRN

## 2020-06-20 MED ORDER — PROPOFOL 500 MG/50ML IV EMUL: INTRAVENOUS | Status: AC | PRN

## 2020-06-20 MED ORDER — PANTOPRAZOLE SODIUM 40 MG PO TBEC: 40.0000 mg | DELAYED_RELEASE_TABLET | Freq: Every day | ORAL | Status: DC | PRN

## 2020-06-20 MED ORDER — POLYETHYLENE GLYCOL 3350 17 G PO PACK: 17.0000 g | PACK | Freq: Every day | ORAL | Status: DC | PRN

## 2020-06-20 MED ORDER — MAGNESIUM CITRATE PO SOLN: 1.0000 | Freq: Once | ORAL | Status: DC | PRN

## 2020-06-20 MED ORDER — CEPHALEXIN 500 MG PO CAPS
500.0000 mg | ORAL_CAPSULE | Freq: Three times a day (TID) | ORAL | Status: DC
  Administered 2020-06-20 – 2020-06-22 (×4): 500 mg via ORAL
  Filled 2020-06-20 (×4): qty 1

## 2020-06-20 NOTE — Consult Note (Signed)
ORTHOPAEDIC CONSULTATION  REQUESTING PHYSICIAN: Leandrew Koyanagi, MD  Chief Complaint: Prosthetic left hip dislocation  HPI: [name] is a 54 y.o. male who presents with a dislocated prosthetic left hip.  2 days ago he states that he was struggling to put on boots and he felt his left hip pop.  He does report that for the last year or so he has had subjective popping and dislocating sensation in the left hip.  I treated him 2 months ago for a prosthetic left hip infection with a head and liner exchange.  He did well postoperatively until this recent event.  He is very upset and frustrated with the fact that his hip has been popping in and out for over a year.  He would like to have something done definitively.  Past Medical History:  Diagnosis Date  . Cauda equina syndrome ([place]) 07/31/2019  . 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 History of chicken pox   . Medical history non-contributory    Past Surgical History:  Procedure Laterality Date  . CARPAL TUNNEL RELEASE Bilateral 2009  . CATARACT EXTRACTION W/PHACO Left 12/17/2019   Procedure: CATARACT EXTRACTION PHACO AND INTRAOCULAR LENS PLACEMENT (IOC) LEFT;  Surgeon: Eulogio Bear, MD;  Location: [facility];  Service: Ophthalmology;  Laterality: Left;  3.61 0:26.9  . CATARACT EXTRACTION W/PHACO Right 03/17/2020   Procedure: CATARACT EXTRACTION PHACO AND INTRAOCULAR LENS PLACEMENT (IOC) RIGHT 4.41  00:28.1;  Surgeon: Eulogio Bear, MD;  Location: [facility];  Service: Ophthalmology;  Laterality: Right;  . COLONOSCOPY W/ POLYPECTOMY    . IR FLUORO GUIDED NEEDLE PLC ASPIRATION/INJECTION LOC  04/10/2020  . JOINT REPLACEMENT    . LACERATION REPAIR Right ~ 2012   leg  . LUMBAR LAMINECTOMY/DECOMPRESSION MICRODISCECTOMY N/A 07/24/2019   Procedure: Decompressive Lumbar Laminectomy Lumbar three-four Lumbar four-five for Epidural Abscess;  Surgeon: Kary Kos, MD;  Location: [facility];  Service: Neurosurgery;   Laterality: N/A;  . TOTAL HIP ARTHROPLASTY Right 01/11/2013   Procedure: TOTAL HIP ARTHROPLASTY;  Surgeon: Garald Balding, MD;  Location: [facility];  Service: Orthopedics;  Laterality: Right;  . TOTAL HIP ARTHROPLASTY Left 10/12/2016   Procedure: TOTAL HIP ARTHROPLASTY;  Surgeon: Garald Balding, MD;  Location: [facility];  Service: Orthopedics;  Laterality: Left;  . TOTAL HIP ARTHROPLASTY Left 04/11/2020   Procedure: LEFT HIP IRRIGATION AND DEBRIDEMENT WITH HEAD AND POLY SWAP;  Surgeon: Leandrew Koyanagi, MD;  Location: [facility];  Service: Orthopedics;  Laterality: Left;   Social History   Socioeconomic History  . Marital status: Married    Spouse name: Manuela Schwartz  . Number of children: 2  . Years of education: High school  . Highest education level: Not on file  Occupational History  . 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 Smoking status: Former Smoker    Packs/day: 1.00    Years: 14.00    Pack years: 14.00    Types: Cigarettes    Quit date: 07/27/1999    Years since quitting: 20.9  . Smokeless tobacco: Never Used  Vaping Use  . Vaping Use: Never used  Substance and Sexual Activity  . Alcohol use: Yes    Alcohol/week: 24.0 standard drinks    Types: 24 Cans of beer per week    Comment: 4-5 beers per day, case of beer per week  . Drug use: No  . Sexual activity: Yes  Birth control/protection: Post-menopausal  Other Topics Concern  . Not on file  Social History Narrative   02/05/19   From: the area   Living: Living with wife Manuela Schwartz   Work: Dealer      Family: 2 children - [name] and [name] - liver nearby, no grandkids      Enjoys: float [place] the river, race track, camping      Exercise: just keeping busy at work   Diet: mostly meat and potatoes      Safety   Seat belts: Yes    Guns: Yes  and secure   Safe in relationships: Yes    Social Determinants of Health   Financial Resource Strain:   . Difficulty of Paying Living  Expenses: Not on file  Food Insecurity:   . Worried About Charity fundraiser in the Last Year: Not on file  . Ran Out of Food in the Last Year: Not on file  Transportation Needs:   . Lack of Transportation (Medical): Not on file  . Lack of Transportation (Non-Medical): Not on file  Physical Activity:   . Days of Exercise per Week: Not on file  . Minutes of Exercise per Session: Not on file  Stress:   . Feeling of Stress : Not on file  Social Connections:   . Frequency of Communication with Friends and Family: Not on file  . Frequency of Social Gatherings with Friends and Family: Not on file  . Attends Religious Services: Not on file  . Active Member of Clubs or Organizations: Not on file  . Attends Archivist Meetings: Not on file  . Marital Status: Not on file   Family History  Problem Relation Age of Onset  . Cancer Father        Hodgkin's disease  . COPD Mother   . Heart attack Maternal Grandfather 80  . Diabetes Neg Hx   . Stroke Neg Hx   . Hypertension Neg Hx   . Hyperlipidemia Neg Hx    - negative except otherwise stated in the family history section Allergies  Allergen Reactions  . Bee Venom Anaphylaxis  . Hydrocodone Nausea And Vomiting    (12/10/19 - pt says he has taken without issues)   Prior to Admission medications   Medication Sig Start Date End Date Taking? Authorizing Provider  aspirin EC 81 MG tablet Take 1 tablet (81 mg total) by mouth 2 (two) times daily as needed. Patient taking differently: Take 81 mg by mouth daily.  04/29/20  Yes Aundra Dubin, PA-C  Ca Carbonate-Mag Hydroxide (ROLAIDS PO) Take 1 tablet by mouth daily as needed (heartburn).   Yes [provider]  cephALEXin (KEFLEX) 500 MG capsule Take 1 capsule (500 mg total) by mouth 3 (three) times daily. 05/20/20  Yes Michel Bickers, MD  cyclobenzaprine (FLEXERIL) 10 MG tablet TAKE 1 TABLET BY MOUTH THREE TIMES A DAY AS NEEDED FOR MUSCLE SPASMS Patient taking differently:  Take 10 mg by mouth 3 (three) times daily as needed.  02/21/20  Yes Lesleigh Noe, MD  meloxicam (MOBIC) 7.5 MG tablet 1 tab po daily prn pain Patient taking differently: Take 7.5 mg by mouth 3 (three) times daily as needed for pain.  04/30/20  Yes Aundra Dubin, PA-C  methocarbamol (ROBAXIN) 500 MG tablet Take 1 tablet (500 mg total) by mouth 2 (two) times daily as needed. 04/21/20  Yes Aundra Dubin, PA-C  omeprazole (PRILOSEC OTC) 20 MG tablet Take 20 mg  by mouth daily as needed (heart burn).   Yes [provider]  oxyCODONE-acetaminophen (PERCOCET) 5-325 MG tablet Take 1-2 tablets by mouth 2 (two) times daily as needed for severe pain. 05/27/20  Yes Aundra Dubin, PA-C  rifampin (RIFADIN) 300 MG capsule Take 2 capsules (600 mg total) by mouth daily. 05/20/20 06/25/20 Yes Michel Bickers, MD  traMADol (ULTRAM) 50 MG tablet Take 1 tablet (50 mg total) by mouth every 6 (six) hours as needed. Patient taking differently: Take 50 mg by mouth every 6 (six) hours as needed for moderate pain.  06/17/20  Yes Aundra Dubin, PA-C  enoxaparin (LOVENOX) 40 MG/0.4ML injection Inject 0.4 mLs (40 mg total) into the skin daily. Patient not taking: Reported on 06/20/2020 04/14/20   Aundra Dubin, PA-C  polyethylene glycol (MIRALAX / GLYCOLAX) 17 g packet Take 17 g by mouth 2 (two) times daily. Patient not taking: Reported on 06/20/2020 04/15/20   Mariel Aloe, MD  senna-docusate (SENOKOT-S) 8.6-50 MG tablet Take 1 tablet by mouth 2 (two) times daily. Patient not taking: Reported on 06/20/2020 04/15/20   Mariel Aloe, MD  cyclobenzaprine (FLEXERIL) 10 MG tablet Take 1 tablet (10 mg total) by mouth 3 (three) times daily as needed for muscle spasms. 09/14/19   Elby Beck, FNP  cyclobenzaprine (FLEXERIL) 10 MG tablet TAKE 1 TABLET BY MOUTH THREE TIMES A DAY AS NEEDED FOR MUSCLE SPASMS 11/07/19   Lesleigh Noe, MD   DG Hip Unilat With Pelvis 2-3 Views Left  Result Date:  06/20/2020 CLINICAL DATA:  Possible dislocation EXAM: DG HIP (WITH OR WITHOUT PELVIS) 2-3V LEFT COMPARISON:  04/09/2020 FINDINGS: There is a left total hip arthroplasty. Dislocation of the femoral component superolaterally with respect to the acetabular component. No periprosthetic fracture identified. Partially imaged right total hip arthroplasty. IMPRESSION: Dislocation of left total hip arthroplasty. Electronically Signed   By: Macy Mis M.D.   On: 06/20/2020 14:47   - pertinent xrays, CT, MRI studies were reviewed and independently interpreted  Positive ROS: All other systems have been reviewed and were otherwise negative with the exception of those mentioned in the HPI and as above.  Physical Exam: General: No acute distress Cardiovascular: No pedal edema Respiratory: No cyanosis, no use of accessory musculature GI: No organomegaly, abdomen is soft and non-tender Skin: No lesions in the area of chief complaint Neurologic: Sensation intact distally Psychiatric: Patient is at baseline mood and affect Lymphatic: No axillary or cervical lymphadenopathy  MUSCULOSKELETAL:  Left lower extremity is shortened and rotated.  Neurovascularly intact distally.  Assessment: Dislocation of left total hip replacement.  Plan: Based on our findings it sounds like the hip replacement has been unstable for at least a year but up until this event it has only been subluxing and has not frankly dislocated.  Reduction was unsuccessful in the ER today.  We had a lengthy discussion about attempting another closed reduction in the operating room versus definitive treatment of revision surgery given long history of instability.  Based on our discussion of risks, benefits, alternatives to surgery the patient has elected to proceed with revision hip surgery tomorrow instead of attempted closed reduction tonight.  We will obtain Covid test tonight.  N.p.o. after midnight.  Questions encouraged and  answered.  Thank you for the consult and the opportunity to see [name] Eduard Roux, MD [facility] 4:28 PM

## 2020-06-20 NOTE — ED Notes (Signed)
Dinner Tray Ordered @ 1658. 

## 2020-06-20 NOTE — ED Triage Notes (Signed)
Pt presents with pain and shortening to his Left leg. Pt had a hip replacement 2 months ago, attempted to put on his work boots, hip made a popping noise and Left hip feels "wider" since then.

## 2020-06-20 NOTE — ED Provider Notes (Signed)
[facility] EMERGENCY DEPARTMENT Provider Note   CSN: [id number] Arrival date & time: 06/20/20  1339     History Chief Complaint  Patient presents with  . Hip Pain    [name] is a 54 y.o. male.  54 yo M with a chief complaint of left hip pain.  This started yesterday.  The patient was putting on a pair of his work shoes when he suddenly felt some pain and discomfort to the leg.  Since then has not been able to bear weight to that leg.  Pain with motion of the hip.  Has been able to move it only by lifting it up with his arms.  He denies any overt trauma.  Had a recent hip replacement a couple months ago.  Has called the orthopedic office but his orthopedic surgeon was not available.  The history is provided by the patient.  Hip Pain This is a new problem. The current episode started yesterday. The problem occurs constantly. The problem has not changed since onset.Pertinent negatives include no chest pain, no abdominal pain, no headaches and no shortness of breath. The symptoms are aggravated by bending and twisting. Nothing relieves the symptoms. He has tried nothing for the symptoms. The treatment provided no relief.       Past Medical History:  Diagnosis Date  . Cauda equina syndrome ([place]) 07/31/2019  . 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 History of chicken pox   . Medical history non-contributory     Patient Active Problem List   Diagnosis Date Noted  . Infection of left prosthetic hip joint ([place]) 04/11/2020  . MSSA bacteremia 04/11/2020  . Acute low back pain 04/11/2020  . Adjustment disorder with depressed mood 11/19/2019  . Left hip pain 10/09/2019  . Blurry vision, left eye 10/09/2019  . Hyponatremia 08/20/2019  . S/P lumbar laminectomy 07/25/2019  . H/O MSSA epidural abscess, L2-L5 07/25/2019  . Anemia 07/18/2019  . Obesity (BMI 35.0-39.9 without comorbidity) 07/18/2019  . Constipation 07/18/2019  . Elevated blood sugar 04/01/2017  .  Alcohol abuse, in remission 10/12/2016  . S/P total hip arthroplasty 10/12/2016    Past Surgical History:  Procedure Laterality Date  . CARPAL TUNNEL RELEASE Bilateral 2009  . CATARACT EXTRACTION W/PHACO Left 12/17/2019   Procedure: CATARACT EXTRACTION PHACO AND INTRAOCULAR LENS PLACEMENT (IOC) LEFT;  Surgeon: Eulogio Bear, MD;  Location: [facility];  Service: Ophthalmology;  Laterality: Left;  3.61 0:26.9  . CATARACT EXTRACTION W/PHACO Right 03/17/2020   Procedure: CATARACT EXTRACTION PHACO AND INTRAOCULAR LENS PLACEMENT (IOC) RIGHT 4.41  00:28.1;  Surgeon: Eulogio Bear, MD;  Location: [facility];  Service: Ophthalmology;  Laterality: Right;  . COLONOSCOPY W/ POLYPECTOMY    . IR FLUORO GUIDED NEEDLE PLC ASPIRATION/INJECTION LOC  04/10/2020  . JOINT REPLACEMENT    . LACERATION REPAIR Right ~ 2012   leg  . LUMBAR LAMINECTOMY/DECOMPRESSION MICRODISCECTOMY N/A 07/24/2019   Procedure: Decompressive Lumbar Laminectomy Lumbar three-four Lumbar four-five for Epidural Abscess;  Surgeon: Kary Kos, MD;  Location: [facility];  Service: Neurosurgery;  Laterality: N/A;  . TOTAL HIP ARTHROPLASTY Right 01/11/2013   Procedure: TOTAL HIP ARTHROPLASTY;  Surgeon: Garald Balding, MD;  Location: [facility];  Service: Orthopedics;  Laterality: Right;  . TOTAL HIP ARTHROPLASTY Left 10/12/2016   Procedure: TOTAL HIP ARTHROPLASTY;  Surgeon: Garald Balding, MD;  Location: [facility];  Service: Orthopedics;  Laterality: Left;  . TOTAL HIP ARTHROPLASTY Left 04/11/2020   Procedure:  LEFT HIP IRRIGATION AND DEBRIDEMENT WITH HEAD AND POLY SWAP;  Surgeon: Leandrew Koyanagi, MD;  Location: [facility];  Service: Orthopedics;  Laterality: Left;       Family History  Problem Relation Age of Onset  . Cancer Father        Hodgkin's disease  . COPD Mother   . Heart attack Maternal Grandfather 80  . Diabetes Neg Hx   . Stroke Neg Hx   . Hypertension Neg Hx   . Hyperlipidemia Neg Hx     Social History    Tobacco Use  . Smoking status: Former Smoker    Packs/day: 1.00    Years: 14.00    Pack years: 14.00    Types: Cigarettes    Quit date: 07/27/1999    Years since quitting: 20.9  . Smokeless tobacco: Never Used  Vaping Use  . Vaping Use: Never used  Substance Use Topics  . Alcohol use: Yes    Alcohol/week: 24.0 standard drinks    Types: 24 Cans of beer per week    Comment: 4-5 beers per day, case of beer per week  . Drug use: No    Home Medications Prior to Admission medications   Medication Sig Start Date End Date Taking? Authorizing Provider  aspirin EC 81 MG tablet Take 1 tablet (81 mg total) by mouth 2 (two) times daily as needed. Patient taking differently: Take 81 mg by mouth daily.  04/29/20  Yes Aundra Dubin, PA-C  Ca Carbonate-Mag Hydroxide (ROLAIDS PO) Take 1 tablet by mouth daily as needed (heartburn).   Yes [provider]  cephALEXin (KEFLEX) 500 MG capsule Take 1 capsule (500 mg total) by mouth 3 (three) times daily. 05/20/20  Yes Michel Bickers, MD  cyclobenzaprine (FLEXERIL) 10 MG tablet TAKE 1 TABLET BY MOUTH THREE TIMES A DAY AS NEEDED FOR MUSCLE SPASMS Patient taking differently: Take 10 mg by mouth 3 (three) times daily as needed.  02/21/20  Yes Lesleigh Noe, MD  meloxicam (MOBIC) 7.5 MG tablet 1 tab po daily prn pain Patient taking differently: Take 7.5 mg by mouth 3 (three) times daily as needed for pain.  04/30/20  Yes Aundra Dubin, PA-C  methocarbamol (ROBAXIN) 500 MG tablet Take 1 tablet (500 mg total) by mouth 2 (two) times daily as needed. 04/21/20  Yes Aundra Dubin, PA-C  omeprazole (PRILOSEC OTC) 20 MG tablet Take 20 mg by mouth daily as needed (heart burn).   Yes [provider]  oxyCODONE-acetaminophen (PERCOCET) 5-325 MG tablet Take 1-2 tablets by mouth 2 (two) times daily as needed for severe pain. 05/27/20  Yes Aundra Dubin, PA-C  rifampin (RIFADIN) 300 MG capsule Take 2 capsules (600 mg total) by mouth daily.  05/20/20 06/25/20 Yes Michel Bickers, MD  traMADol (ULTRAM) 50 MG tablet Take 1 tablet (50 mg total) by mouth every 6 (six) hours as needed. Patient taking differently: Take 50 mg by mouth every 6 (six) hours as needed for moderate pain.  06/17/20  Yes Aundra Dubin, PA-C  enoxaparin (LOVENOX) 40 MG/0.4ML injection Inject 0.4 mLs (40 mg total) into the skin daily. Patient not taking: Reported on 06/20/2020 04/14/20   Aundra Dubin, PA-C  polyethylene glycol (MIRALAX / GLYCOLAX) 17 g packet Take 17 g by mouth 2 (two) times daily. Patient not taking: Reported on 06/20/2020 04/15/20   Mariel Aloe, MD  senna-docusate (SENOKOT-S) 8.6-50 MG tablet Take 1 tablet by mouth 2 (two) times daily. Patient not taking:  Reported on 06/20/2020 04/15/20   Mariel Aloe, MD  cyclobenzaprine (FLEXERIL) 10 MG tablet Take 1 tablet (10 mg total) by mouth 3 (three) times daily as needed for muscle spasms. 09/14/19   Elby Beck, FNP  cyclobenzaprine (FLEXERIL) 10 MG tablet TAKE 1 TABLET BY MOUTH THREE TIMES A DAY AS NEEDED FOR MUSCLE SPASMS 11/07/19   Lesleigh Noe, MD    Allergies    Bee venom and Hydrocodone  Review of Systems   Review of Systems  Constitutional: Negative for chills and fever.  HENT: Negative for congestion and facial swelling.   Eyes: Negative for discharge and visual disturbance.  Respiratory: Negative for shortness of breath.   Cardiovascular: Negative for chest pain and palpitations.  Gastrointestinal: Negative for abdominal pain, diarrhea and vomiting.  Musculoskeletal: Positive for arthralgias and myalgias.  Skin: Negative for color change and rash.  Neurological: Negative for tremors, syncope and headaches.  Psychiatric/Behavioral: Negative for confusion and dysphoric mood.    Physical Exam Updated Vital Signs BP 109/76   Pulse 91   Temp 98.7 F (37.1 C) (Oral)   Resp 19   Ht 6' (1.829 m)   Wt 104.3 kg   SpO2 98%   BMI 31.19 kg/m   Physical Exam Vitals and  nursing note reviewed.  Constitutional:      Appearance: He is well-developed.  HENT:     Head: Normocephalic and atraumatic.  Eyes:     Pupils: Pupils are equal, round, and reactive to light.  Neck:     Vascular: No JVD.  Cardiovascular:     Rate and Rhythm: Normal rate and regular rhythm.     Heart sounds: No murmur heard.  No friction rub. No gallop.   Pulmonary:     Effort: No respiratory distress.     Breath sounds: No wheezing.  Abdominal:     General: There is no distension.     Tenderness: There is no abdominal tenderness. There is no guarding or rebound.  Musculoskeletal:        General: Tenderness and deformity present. Normal range of motion.     Cervical back: Normal range of motion and neck supple.     Comments: Shortening and pain to the left hip.  PMS intact distally.  Skin:    Coloration: Skin is not pale.     Findings: No rash.  Neurological:     Mental Status: He is alert and oriented to person, place, and time.  Psychiatric:        Behavior: Behavior normal.     ED Results / Procedures / Treatments   Labs (all labs ordered are listed, but only abnormal results are displayed) Labs Reviewed  RESP PANEL BY RT-PCR (FLU A&B, COVID) [id number]    EKG None  Radiology DG Hip Unilat With Pelvis 2-3 Views Left  Result Date: 06/20/2020 CLINICAL DATA:  Possible dislocation EXAM: DG HIP (WITH OR WITHOUT PELVIS) 2-3V LEFT COMPARISON:  04/09/2020 FINDINGS: There is a left total hip arthroplasty. Dislocation of the femoral component superolaterally with respect to the acetabular component. No periprosthetic fracture identified. Partially imaged right total hip arthroplasty. IMPRESSION: Dislocation of left total hip arthroplasty. Electronically Signed   By: Macy Mis M.D.   On: 06/20/2020 14:47    Procedures .Sedation  Date/Time: 06/20/2020 3:36 PM Performed by: Deno Etienne, DO Authorized by: Deno Etienne, DO   Consent:    Consent obtained:  Verbal   Consent  given by:  Patient   Risks  discussed:  Allergic reaction, dysrhythmia, inadequate sedation, nausea, prolonged hypoxia resulting in organ damage, prolonged sedation necessitating reversal, respiratory compromise necessitating ventilatory assistance and intubation and vomiting   Alternatives discussed:  Analgesia without sedation, anxiolysis and regional anesthesia Universal protocol:    Procedure explained and questions answered to patient or proxy's satisfaction: yes     Relevant documents present and verified: yes     Test results available and properly labeled: yes     Imaging studies available: yes     Required blood products, implants, devices, and special equipment available: yes     Site/side marked: yes     Immediately prior to procedure a time out was called: yes     Patient identity confirmation method:  Verbally with patient Indications:    Procedure performed:  Dislocation reduction   Procedure necessitating sedation performed by:  Physician performing sedation Pre-sedation assessment:    Time since last food or drink:  6   ASA classification: class 1 - normal, healthy patient     Neck mobility: normal     Mouth opening:  2 finger widths   Thyromental distance:  3 finger widths   Mallampati score:  II - soft palate, uvula, fauces visible   Pre-sedation assessments completed and reviewed: airway patency, cardiovascular function, hydration status, mental status, nausea/vomiting, pain level, respiratory function and temperature   Immediate pre-procedure details:    Reassessment: Patient reassessed immediately prior to procedure     Reviewed: vital signs, relevant labs/tests and NPO status     Verified: bag valve mask available, emergency equipment available, intubation equipment available, IV patency confirmed, oxygen available and suction available   Procedure details (see MAR for exact dosages):    Preoxygenation:  Nasal cannula   Sedation:  Propofol   Intended level of  sedation: deep   Analgesia:  Fentanyl   Intra-procedure monitoring:  Blood pressure monitoring, cardiac monitor, continuous pulse oximetry, frequent LOC assessments, frequent vital sign checks and continuous capnometry   Intra-procedure events: hypoxia and respiratory depression     Intra-procedure management:  Airway repositioning and supplemental oxygen   Total Provider sedation time (minutes):  35 Post-procedure details:    Attendance: Constant attendance by certified staff until patient recovered     Recovery: Patient returned to pre-procedure baseline     Post-sedation assessments completed and reviewed: airway patency, cardiovascular function, hydration status, mental status, nausea/vomiting, pain level, respiratory function and temperature     Patient is stable for discharge or admission: yes     Patient tolerance:  Tolerated well, no immediate complications Reduction of dislocation  Date/Time: 06/20/2020 3:36 PM Performed by: Deno Etienne, DO Authorized by: Deno Etienne, DO  Local anesthesia used: no  Anesthesia: Local anesthesia used: no  Sedation: Patient sedated: yes Sedation type: moderate (conscious) sedation Sedatives: propofol Analgesia: fentanyl  Patient tolerance: patient tolerated the procedure well with no immediate complications Comments: Unsuccessful reduction of the left hip    (including critical care time)  Medications Ordered in ED Medications  propofol (DIPRIVAN) 500 MG/50ML infusion (200 mg Intravenous New Bag/Given 06/20/20 1535)  fentaNYL (SUBLIMAZE) injection 100 mcg (100 mcg Intravenous Given 06/20/20 1416)  propofol (DIPRIVAN) 10 mg/mL bolus/IV push 52.2 mg (52.2 mg Intravenous Given 06/20/20 1517)  fentaNYL (SUBLIMAZE) injection 150 mcg (150 mcg Intravenous Given 06/20/20 1517)  sodium chloride 0.9 % bolus 1,000 mL (1,000 mLs Intravenous New Bag/Given 06/20/20 1507)    ED Course  I have reviewed the triage vital signs and the nursing  notes.  Pertinent labs & imaging results that were available during my care of the patient were reviewed by me and considered in my medical decision making (see chart for details).    MDM Rules/Calculators/A&P                          54 yo with a chief complaint of left hip pain.  Patient had recent hip replacement.  Concern with shortening to the leg that he is dislocated.  Will obtain a plain film.  Plain film with L hip dislocation. Discussed with ortho, dr. Erlinda Hong, felt okay to go ahead with reduction attempt in the ED.  Patient was a prior alcoholic, was given of the large amount of propofol without significant sedation.  After 200 mg had about 5 minutes of ability to try and reduce and was unable.  Discussed with Dr. Erlinda Hong who will post for the OR.  The patients results and plan were reviewed and discussed.   Any x-rays performed were independently reviewed by myself.   Differential diagnosis were considered with the presenting HPI.  Medications  propofol (DIPRIVAN) 500 MG/50ML infusion (200 mg Intravenous New Bag/Given 06/20/20 1535)  fentaNYL (SUBLIMAZE) injection 100 mcg (100 mcg Intravenous Given 06/20/20 1416)  propofol (DIPRIVAN) 10 mg/mL bolus/IV push 52.2 mg (52.2 mg Intravenous Given 06/20/20 1517)  fentaNYL (SUBLIMAZE) injection 150 mcg (150 mcg Intravenous Given 06/20/20 1517)  sodium chloride 0.9 % bolus 1,000 mL (1,000 mLs Intravenous New Bag/Given 06/20/20 1507)    Vitals:   06/20/20 1521 06/20/20 1529 06/20/20 1530 06/20/20 1533  BP: 109/80 118/74 99/69 109/76  Pulse: 91 91 87 91  Resp: 16 13 15 19   Temp:      TempSrc:      SpO2: 97% 100% 100% 98%  Weight:      Height:        Final diagnoses:  Dislocation of left hip, initial encounter [facility])      Final Clinical Impression(s) / ED Diagnoses Final diagnoses:  Dislocation of left hip, initial encounter [facility])    Rx / DC Orders ED Discharge Orders    None       Deno Etienne, DO 06/20/20 1537

## 2020-06-20 NOTE — H&P (Signed)
See consult note

## 2020-06-20 NOTE — ED Notes (Signed)
Pt in xray, unable to validate vitals at this time.

## 2020-06-21 ENCOUNTER — Inpatient Hospital Stay (HOSPITAL_COMMUNITY): Payer: BC Managed Care – PPO | Admitting: Certified Registered Nurse Anesthetist

## 2020-06-21 ENCOUNTER — Encounter (HOSPITAL_COMMUNITY)
Admission: EM | Disposition: A | Discharge status: Home / Self Care | Payer: Self-pay | Source: Home / Self Care | Attending: Orthopaedic Surgery

## 2020-06-21 ENCOUNTER — Inpatient Hospital Stay (HOSPITAL_COMMUNITY): Payer: BC Managed Care – PPO

## 2020-06-21 ENCOUNTER — Encounter (HOSPITAL_COMMUNITY): Payer: Self-pay | Admitting: Orthopaedic Surgery

## 2020-06-21 DIAGNOSIS — S73005A Unspecified dislocation of left hip, initial encounter: Secondary | ICD-10-CM

## 2020-06-21 DIAGNOSIS — T84021A Dislocation of internal left hip prosthesis, initial encounter: Secondary | ICD-10-CM | POA: Diagnosis not present

## 2020-06-21 HISTORY — PX: TOTAL HIP REVISION: SHX763

## 2020-06-21 LAB — SURGICAL PCR SCREEN
MRSA, PCR: NEGATIVE
Staphylococcus aureus: NEGATIVE

## 2020-06-21 IMAGING — DX DG PORTABLE PELVIS
1 series · 1 of 1 positions shown · non-contrast
Comparison: [DATE]

CLINICAL DATA: Recent hip dislocation with new total hip
replacement on the left.

EXAM:
PORTABLE PELVIS 1-2 VIEWS

[pelvis ap]
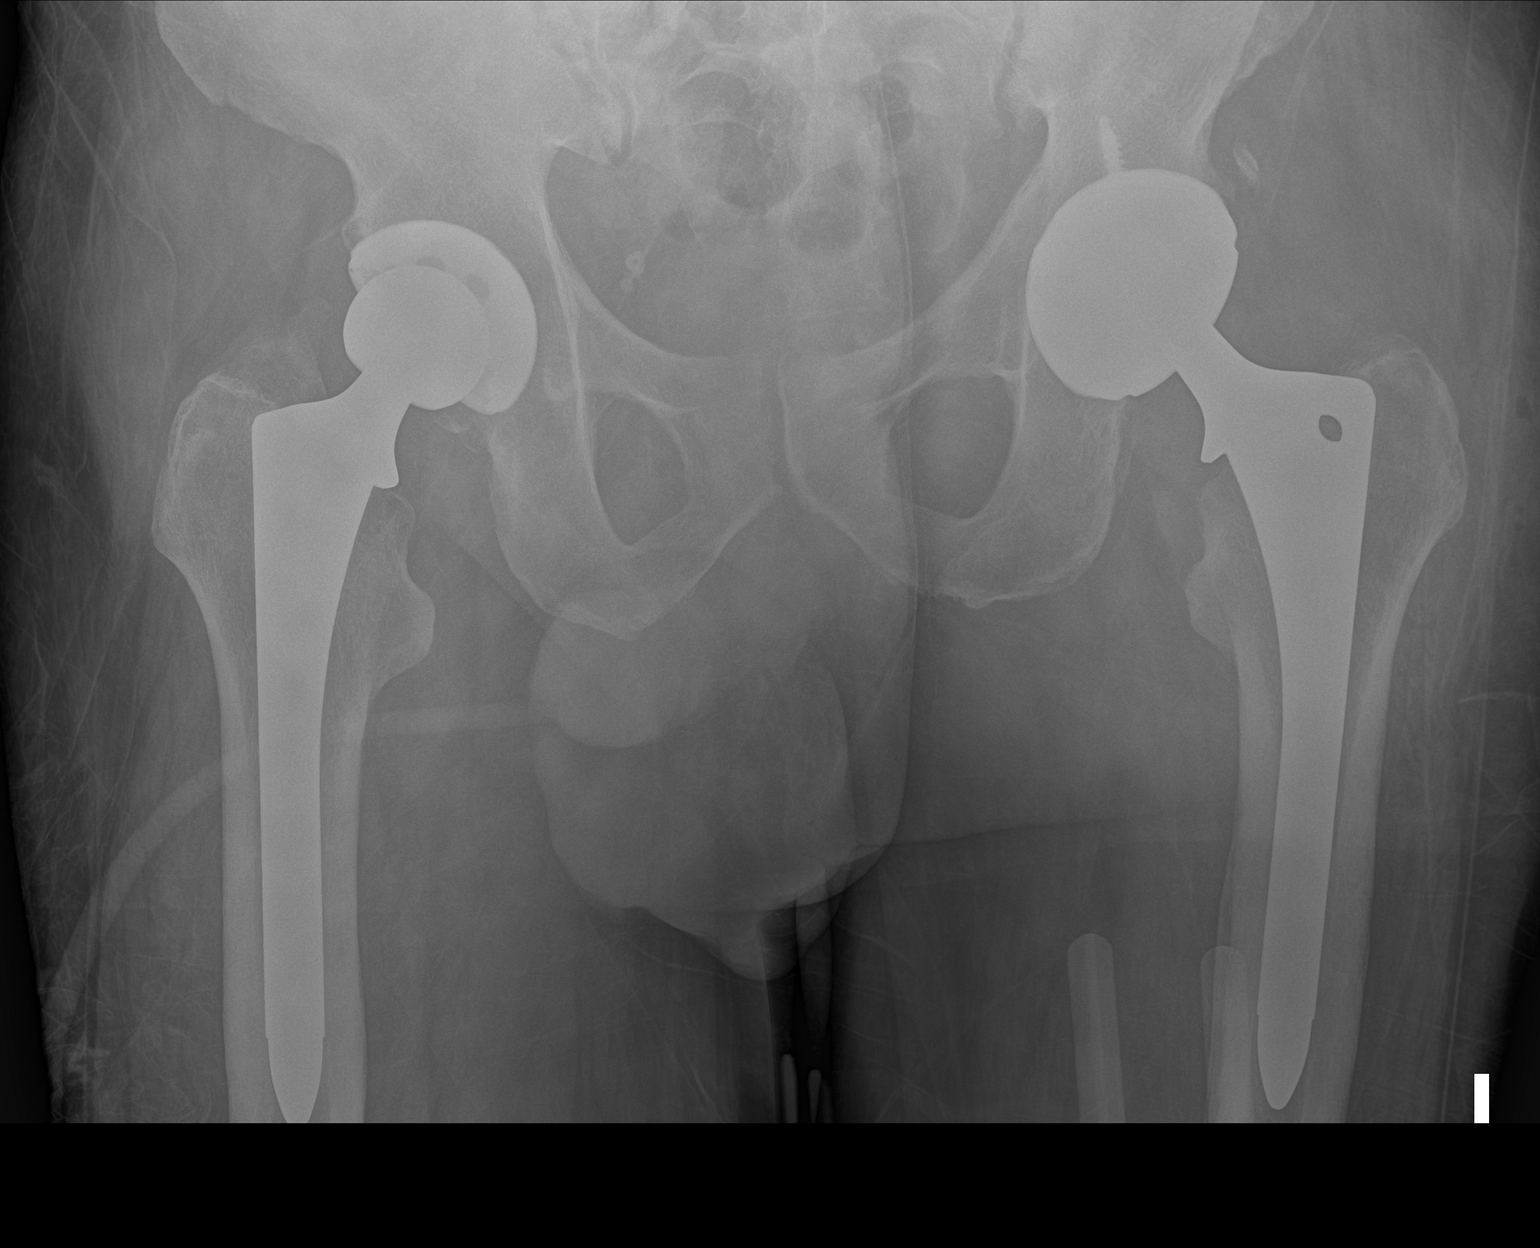

[1 of 1 positions shown; findings below may reference images not displayed]

FINDINGS: Frontal view of mid to lower pelvis and both hips obtained. There is
a new total hip replacement on the left with prosthetic components
well-seated. Total hip replacement on the right appear stable. No
fracture or dislocation. No erosion.
IMPRESSION: There are total hip replacements bilaterally with new total hip
replacement on the left. No fracture or dislocation. No erosion.

## 2020-06-21 SURGERY — TOTAL HIP REVISION
Anesthesia: General | Site: Hip | Laterality: Left

## 2020-06-21 MED ORDER — ROCURONIUM BROMIDE 10 MG/ML (PF) SYRINGE
PREFILLED_SYRINGE | INTRAVENOUS | Status: AC
  Filled 2020-06-21: qty 10

## 2020-06-21 MED ORDER — METOCLOPRAMIDE HCL 5 MG/ML IJ SOLN: 5.0000 mg | Freq: Three times a day (TID) | INTRAMUSCULAR | Status: DC | PRN

## 2020-06-21 MED ORDER — PROPOFOL 10 MG/ML IV BOLUS
INTRAVENOUS | Status: AC
  Filled 2020-06-21: qty 20

## 2020-06-21 MED ORDER — OXYCODONE HCL 5 MG PO TABS: 5.0000 mg | ORAL_TABLET | Freq: Once | ORAL | Status: DC | PRN

## 2020-06-21 MED ORDER — TRANEXAMIC ACID 1000 MG/10ML IV SOLN
2000.0000 mg | INTRAVENOUS | Status: DC
  Filled 2020-06-21 (×3): qty 20

## 2020-06-21 MED ORDER — ACETAMINOPHEN 500 MG PO TABS: 1000.0000 mg | ORAL_TABLET | Freq: Once | ORAL | Status: DC | PRN

## 2020-06-21 MED ORDER — TRANEXAMIC ACID-NACL 1000-0.7 MG/100ML-% IV SOLN
INTRAVENOUS | Status: AC
  Filled 2020-06-21: qty 100

## 2020-06-21 MED ORDER — DOCUSATE SODIUM 100 MG PO CAPS
100.0000 mg | ORAL_CAPSULE | Freq: Two times a day (BID) | ORAL | Status: DC
  Administered 2020-06-21 – 2020-06-22 (×2): 100 mg via ORAL
  Filled 2020-06-21 (×2): qty 1

## 2020-06-21 MED ORDER — MIDAZOLAM HCL 2 MG/2ML IJ SOLN
INTRAMUSCULAR | Status: AC
  Filled 2020-06-21: qty 2

## 2020-06-21 MED ORDER — METHOCARBAMOL 1000 MG/10ML IJ SOLN
500.0000 mg | Freq: Four times a day (QID) | INTRAVENOUS | Status: DC | PRN
  Filled 2020-06-21: qty 5

## 2020-06-21 MED ORDER — SORBITOL 70 % SOLN
30.0000 mL | Freq: Every day | Status: DC | PRN
  Filled 2020-06-21: qty 30

## 2020-06-21 MED ORDER — MIDAZOLAM HCL 2 MG/2ML IJ SOLN
INTRAMUSCULAR | Status: DC | PRN
  Administered 2020-06-21: 2 mg via INTRAVENOUS

## 2020-06-21 MED ORDER — VANCOMYCIN HCL 1000 MG IV SOLR
INTRAVENOUS | Status: AC
  Filled 2020-06-21: qty 1000

## 2020-06-21 MED ORDER — CHLORHEXIDINE GLUCONATE CLOTH 2 % EX PADS: 6.0000 | MEDICATED_PAD | Freq: Every day | CUTANEOUS | Status: DC

## 2020-06-21 MED ORDER — BUPIVACAINE-EPINEPHRINE 0.25% -1:200000 IJ SOLN: INTRAMUSCULAR | Status: DC | PRN

## 2020-06-21 MED ORDER — FENTANYL CITRATE (PF) 100 MCG/2ML IJ SOLN
25.0000 ug | INTRAMUSCULAR | Status: DC | PRN
  Administered 2020-06-21: 50 ug via INTRAVENOUS

## 2020-06-21 MED ORDER — FENTANYL CITRATE (PF) 250 MCG/5ML IJ SOLN
INTRAMUSCULAR | Status: AC
  Filled 2020-06-21: qty 5

## 2020-06-21 MED ORDER — SODIUM CHLORIDE 0.9 % IV SOLN: INTRAVENOUS | Status: DC

## 2020-06-21 MED ORDER — ACETAMINOPHEN 10 MG/ML IV SOLN
INTRAVENOUS | Status: DC | PRN
  Administered 2020-06-21: 1000 mg via INTRAVENOUS

## 2020-06-21 MED ORDER — LACTATED RINGERS IV SOLN: INTRAVENOUS | Status: DC | PRN

## 2020-06-21 MED ORDER — ALBUMIN HUMAN 5 % IV SOLN: INTRAVENOUS | Status: DC | PRN

## 2020-06-21 MED ORDER — ASPIRIN 81 MG PO CHEW
81.0000 mg | CHEWABLE_TABLET | Freq: Two times a day (BID) | ORAL | Status: DC
  Administered 2020-06-21 – 2020-06-22 (×2): 81 mg via ORAL
  Filled 2020-06-21 (×2): qty 1

## 2020-06-21 MED ORDER — ONDANSETRON HCL 4 MG/2ML IJ SOLN
INTRAMUSCULAR | Status: AC
  Filled 2020-06-21: qty 2

## 2020-06-21 MED ORDER — PHENOL 1.4 % MT LIQD: 1.0000 | OROMUCOSAL | Status: DC | PRN

## 2020-06-21 MED ORDER — DEXMEDETOMIDINE (PRECEDEX) IN NS 20 MCG/5ML (4 MCG/ML) IV SYRINGE
PREFILLED_SYRINGE | INTRAVENOUS | Status: DC | PRN
  Administered 2020-06-21: 4 ug via INTRAVENOUS
  Administered 2020-06-21: 8 ug via INTRAVENOUS
  Administered 2020-06-21 (×2): 4 ug via INTRAVENOUS

## 2020-06-21 MED ORDER — BUPIVACAINE HCL (PF) 0.25 % IJ SOLN
INTRAMUSCULAR | Status: AC
  Filled 2020-06-21: qty 30

## 2020-06-21 MED ORDER — BUPIVACAINE HCL (PF) 0.25 % IJ SOLN
INTRAMUSCULAR | Status: DC | PRN
  Administered 2020-06-21: 30 mL

## 2020-06-21 MED ORDER — KETOROLAC TROMETHAMINE 15 MG/ML IJ SOLN
15.0000 mg | Freq: Four times a day (QID) | INTRAMUSCULAR | Status: AC
  Administered 2020-06-21 – 2020-06-22 (×4): 15 mg via INTRAVENOUS
  Filled 2020-06-21 (×4): qty 1

## 2020-06-21 MED ORDER — ALUM & MAG HYDROXIDE-SIMETH 200-200-20 MG/5ML PO SUSP: 30.0000 mL | ORAL | Status: DC | PRN

## 2020-06-21 MED ORDER — PHENYLEPHRINE HCL-NACL 10-0.9 MG/250ML-% IV SOLN
INTRAVENOUS | Status: DC | PRN
  Administered 2020-06-21: 50 ug/min via INTRAVENOUS

## 2020-06-21 MED ORDER — PROPOFOL 10 MG/ML IV BOLUS
INTRAVENOUS | Status: DC | PRN
  Administered 2020-06-21: 200 mg via INTRAVENOUS

## 2020-06-21 MED ORDER — LIDOCAINE HCL (PF) 2 % IJ SOLN
INTRAMUSCULAR | Status: AC
  Filled 2020-06-21: qty 5

## 2020-06-21 MED ORDER — KETAMINE HCL 10 MG/ML IJ SOLN
INTRAMUSCULAR | Status: DC | PRN
  Administered 2020-06-21: 10 mg via INTRAVENOUS
  Administered 2020-06-21: 40 mg via INTRAVENOUS

## 2020-06-21 MED ORDER — FENTANYL CITRATE (PF) 100 MCG/2ML IJ SOLN
INTRAMUSCULAR | Status: AC
  Filled 2020-06-21: qty 2

## 2020-06-21 MED ORDER — BUPIVACAINE LIPOSOME 1.3 % IJ SUSP
INTRAMUSCULAR | Status: DC | PRN
  Administered 2020-06-21: 20 mL

## 2020-06-21 MED ORDER — HYDROMORPHONE HCL 1 MG/ML IJ SOLN
INTRAMUSCULAR | Status: AC
  Filled 2020-06-21: qty 0.5

## 2020-06-21 MED ORDER — HYDROMORPHONE HCL 1 MG/ML IJ SOLN
0.5000 mg | INTRAMUSCULAR | Status: DC | PRN
  Administered 2020-06-21 – 2020-06-22 (×3): 1 mg via INTRAVENOUS
  Filled 2020-06-21 (×3): qty 1

## 2020-06-21 MED ORDER — ENSURE PRE-SURGERY PO LIQD
296.0000 mL | Freq: Once | ORAL | Status: DC
  Filled 2020-06-21: qty 296

## 2020-06-21 MED ORDER — BUPIVACAINE LIPOSOME 1.3 % IJ SUSP
20.0000 mL | Freq: Once | INTRAMUSCULAR | Status: DC
  Filled 2020-06-21: qty 20

## 2020-06-21 MED ORDER — DEXAMETHASONE SODIUM PHOSPHATE 10 MG/ML IJ SOLN
INTRAMUSCULAR | Status: AC
  Filled 2020-06-21: qty 1

## 2020-06-21 MED ORDER — SODIUM CHLORIDE 0.9% FLUSH
INTRAVENOUS | Status: DC | PRN
  Administered 2020-06-21: 20 mL via INTRAVENOUS

## 2020-06-21 MED ORDER — POLYETHYLENE GLYCOL 3350 17 G PO PACK: 17.0000 g | PACK | Freq: Every day | ORAL | Status: DC | PRN

## 2020-06-21 MED ORDER — OXYCODONE HCL 5 MG PO TABS
10.0000 mg | ORAL_TABLET | ORAL | Status: DC | PRN
  Administered 2020-06-21 – 2020-06-22 (×3): 15 mg via ORAL
  Filled 2020-06-21 (×3): qty 3

## 2020-06-21 MED ORDER — TRANEXAMIC ACID-NACL 1000-0.7 MG/100ML-% IV SOLN: 1000.0000 mg | Freq: Once | INTRAVENOUS | Status: DC

## 2020-06-21 MED ORDER — CEFAZOLIN SODIUM-DEXTROSE 2-4 GM/100ML-% IV SOLN
2.0000 g | Freq: Four times a day (QID) | INTRAVENOUS | Status: AC
  Administered 2020-06-21 (×2): 2 g via INTRAVENOUS
  Filled 2020-06-21 (×2): qty 100

## 2020-06-21 MED ORDER — ACETAMINOPHEN 10 MG/ML IV SOLN: 1000.0000 mg | Freq: Once | INTRAVENOUS | Status: DC | PRN

## 2020-06-21 MED ORDER — ONDANSETRON HCL 4 MG PO TABS: 4.0000 mg | ORAL_TABLET | Freq: Four times a day (QID) | ORAL | Status: DC | PRN

## 2020-06-21 MED ORDER — HYDROMORPHONE HCL 1 MG/ML IJ SOLN
INTRAMUSCULAR | Status: DC | PRN
Start: 2020-06-21 — End: 2020-06-21
  Administered 2020-06-21 (×2): .25 mg via INTRAVENOUS

## 2020-06-21 MED ORDER — PHENYLEPHRINE 40 MCG/ML (10ML) SYRINGE FOR IV PUSH (FOR BLOOD PRESSURE SUPPORT)
PREFILLED_SYRINGE | INTRAVENOUS | Status: AC
  Filled 2020-06-21: qty 10

## 2020-06-21 MED ORDER — POVIDONE-IODINE 10 % EX SWAB: 2.0000 "application " | Freq: Once | CUTANEOUS | Status: DC

## 2020-06-21 MED ORDER — TRANEXAMIC ACID-NACL 1000-0.7 MG/100ML-% IV SOLN
1000.0000 mg | INTRAVENOUS | Status: AC
  Administered 2020-06-21: 1000 mg via INTRAVENOUS

## 2020-06-21 MED ORDER — MENTHOL 3 MG MT LOZG: 1.0000 | LOZENGE | OROMUCOSAL | Status: DC | PRN

## 2020-06-21 MED ORDER — OXYCODONE HCL 5 MG/5ML PO SOLN: 5.0000 mg | Freq: Once | ORAL | Status: DC | PRN

## 2020-06-21 MED ORDER — KETAMINE HCL 50 MG/5ML IJ SOSY
PREFILLED_SYRINGE | INTRAMUSCULAR | Status: AC
  Filled 2020-06-21: qty 10

## 2020-06-21 MED ORDER — SUGAMMADEX SODIUM 200 MG/2ML IV SOLN
INTRAVENOUS | Status: DC | PRN
  Administered 2020-06-21: 200 mg via INTRAVENOUS

## 2020-06-21 MED ORDER — MAGNESIUM CITRATE PO SOLN: 1.0000 | Freq: Once | ORAL | Status: DC | PRN

## 2020-06-21 MED ORDER — FENTANYL CITRATE (PF) 250 MCG/5ML IJ SOLN
INTRAMUSCULAR | Status: DC | PRN
  Administered 2020-06-21: 25 ug via INTRAVENOUS
  Administered 2020-06-21 (×3): 50 ug via INTRAVENOUS
  Administered 2020-06-21 (×2): 100 ug via INTRAVENOUS
  Administered 2020-06-21: 50 ug via INTRAVENOUS
  Administered 2020-06-21: 25 ug via INTRAVENOUS
  Administered 2020-06-21: 50 ug via INTRAVENOUS

## 2020-06-21 MED ORDER — TRANEXAMIC ACID 1000 MG/10ML IV SOLN
INTRAVENOUS | Status: DC | PRN
  Administered 2020-06-21: 2000 mg via TOPICAL

## 2020-06-21 MED ORDER — LIDOCAINE 2% (20 MG/ML) 5 ML SYRINGE
INTRAMUSCULAR | Status: DC | PRN
  Administered 2020-06-21: 60 mg via INTRAVENOUS

## 2020-06-21 MED ORDER — ACETAMINOPHEN 160 MG/5ML PO SOLN: 1000.0000 mg | Freq: Once | ORAL | Status: DC | PRN

## 2020-06-21 MED ORDER — DIPHENHYDRAMINE HCL 12.5 MG/5ML PO ELIX: 25.0000 mg | ORAL_SOLUTION | ORAL | Status: DC | PRN

## 2020-06-21 MED ORDER — ROCURONIUM BROMIDE 10 MG/ML (PF) SYRINGE
PREFILLED_SYRINGE | INTRAVENOUS | Status: DC | PRN
  Administered 2020-06-21: 20 mg via INTRAVENOUS
  Administered 2020-06-21: 100 mg via INTRAVENOUS

## 2020-06-21 MED ORDER — METOCLOPRAMIDE HCL 5 MG PO TABS: 5.0000 mg | ORAL_TABLET | Freq: Three times a day (TID) | ORAL | Status: DC | PRN

## 2020-06-21 MED ORDER — ONDANSETRON HCL 4 MG/2ML IJ SOLN: 4.0000 mg | Freq: Four times a day (QID) | INTRAMUSCULAR | Status: DC | PRN

## 2020-06-21 MED ORDER — ONDANSETRON HCL 4 MG/2ML IJ SOLN
INTRAMUSCULAR | Status: DC | PRN
  Administered 2020-06-21: 4 mg via INTRAVENOUS

## 2020-06-21 MED ORDER — ACETAMINOPHEN 500 MG PO TABS
1000.0000 mg | ORAL_TABLET | Freq: Four times a day (QID) | ORAL | Status: AC
  Administered 2020-06-21 – 2020-06-22 (×4): 1000 mg via ORAL
  Filled 2020-06-21 (×4): qty 2

## 2020-06-21 MED ORDER — ACETAMINOPHEN 325 MG PO TABS: 325.0000 mg | ORAL_TABLET | Freq: Four times a day (QID) | ORAL | Status: DC | PRN

## 2020-06-21 MED ORDER — IRRISEPT - 450ML BOTTLE WITH 0.05% CHG IN STERILE WATER, USP 99.95% OPTIME
TOPICAL | Status: DC | PRN
  Administered 2020-06-21: 450 mL

## 2020-06-21 MED ORDER — 0.9 % SODIUM CHLORIDE (POUR BTL) OPTIME
TOPICAL | Status: DC | PRN
  Administered 2020-06-21: 1000 mL

## 2020-06-21 MED ORDER — DEXAMETHASONE SODIUM PHOSPHATE 10 MG/ML IJ SOLN
INTRAMUSCULAR | Status: DC | PRN
  Administered 2020-06-21: 5 mg via INTRAVENOUS

## 2020-06-21 MED ORDER — CEFAZOLIN SODIUM-DEXTROSE 2-4 GM/100ML-% IV SOLN
2.0000 g | INTRAVENOUS | Status: AC
  Administered 2020-06-21: 2 g via INTRAVENOUS

## 2020-06-21 MED ORDER — DEXAMETHASONE SODIUM PHOSPHATE 10 MG/ML IJ SOLN
10.0000 mg | Freq: Once | INTRAMUSCULAR | Status: AC
  Administered 2020-06-22: 10 mg via INTRAVENOUS
  Filled 2020-06-21: qty 1

## 2020-06-21 MED ORDER — OXYCODONE HCL 5 MG PO TABS: 5.0000 mg | ORAL_TABLET | ORAL | Status: DC | PRN

## 2020-06-21 MED ORDER — VANCOMYCIN HCL 1000 MG IV SOLR
INTRAVENOUS | Status: DC | PRN
  Administered 2020-06-21: 1000 mg

## 2020-06-21 MED ORDER — OXYCODONE HCL ER 10 MG PO T12A
10.0000 mg | EXTENDED_RELEASE_TABLET | Freq: Two times a day (BID) | ORAL | Status: DC
  Administered 2020-06-21 – 2020-06-22 (×2): 10 mg via ORAL
  Filled 2020-06-21 (×2): qty 1

## 2020-06-21 MED ORDER — PHENYLEPHRINE 40 MCG/ML (10ML) SYRINGE FOR IV PUSH (FOR BLOOD PRESSURE SUPPORT)
PREFILLED_SYRINGE | INTRAVENOUS | Status: DC | PRN
  Administered 2020-06-21: 40 ug via INTRAVENOUS

## 2020-06-21 MED ORDER — METHOCARBAMOL 500 MG PO TABS
500.0000 mg | ORAL_TABLET | Freq: Four times a day (QID) | ORAL | Status: DC | PRN
  Administered 2020-06-21: 500 mg via ORAL

## 2020-06-21 MED ORDER — ACETAMINOPHEN 10 MG/ML IV SOLN
INTRAVENOUS | Status: AC
  Filled 2020-06-21: qty 100

## 2020-06-21 SURGICAL SUPPLY — 64 items
ADH SKN CLS LQ APL DERMABOND (GAUZE/BANDAGES/DRESSINGS) ×1
BLADE CLIPPER SURG (BLADE) ×3 IMPLANT
BLADE EXPLANT 54 LONG (BLADE) ×2 IMPLANT
BLADE EXPLANT 54 SHORT (BLADE) ×2 IMPLANT
COVER SURGICAL LIGHT HANDLE (MISCELLANEOUS) ×3 IMPLANT
COVER WAND RF STERILE (DRAPES) ×3 IMPLANT
CUP ACETAB 60MM (Orthopedic Implant) ×2 IMPLANT
DERMABOND ADHESIVE PROPEN (GAUZE/BANDAGES/DRESSINGS) ×2
DERMABOND ADVANCED .7 DNX6 (GAUZE/BANDAGES/DRESSINGS) IMPLANT
DRAPE HALF SHEET 40X57 (DRAPES) ×6 IMPLANT
DRAPE HIP W/POCKET STRL (MISCELLANEOUS) ×3 IMPLANT
DRAPE INCISE IOBAN 66X45 STRL (DRAPES) ×3 IMPLANT
DRAPE ORTHO SPLIT 77X108 STRL (DRAPES) ×6
DRAPE SURG ORHT 6 SPLT 77X108 (DRAPES) ×2 IMPLANT
DRAPE U-SHAPE 47X51 STRL (DRAPES) ×3 IMPLANT
DRESSING AQUACEL AG SP 3.5X10 (GAUZE/BANDAGES/DRESSINGS) IMPLANT
DRSG AQUACEL AG SP 3.5X10 (GAUZE/BANDAGES/DRESSINGS) ×3
DURAPREP 26ML APPLICATOR (WOUND CARE) ×9 IMPLANT
ELECT BLADE 4.0 EZ CLEAN MEGAD (MISCELLANEOUS) ×3
ELECT REM PT RETURN 9FT ADLT (ELECTROSURGICAL) ×3
ELECTRODE BLDE 4.0 EZ CLN MEGD (MISCELLANEOUS) IMPLANT
ELECTRODE REM PT RTRN 9FT ADLT (ELECTROSURGICAL) ×1 IMPLANT
FILTER STRAW FLUID ASPIR (MISCELLANEOUS) ×3 IMPLANT
GLOVE BIOGEL PI IND STRL 7.0 (GLOVE) ×1 IMPLANT
GLOVE BIOGEL PI INDICATOR 7.0 (GLOVE) ×2
GLOVE ECLIPSE 6.5 STRL STRAW (GLOVE) ×6 IMPLANT
GLOVE SKINSENSE NS SZ7.5 (GLOVE) ×2
GLOVE SKINSENSE STRL SZ7.5 (GLOVE) ×1 IMPLANT
GLOVE SURG SYN 7.5  E (GLOVE) ×6
GLOVE SURG SYN 7.5 E (GLOVE) ×2 IMPLANT
GLOVE SURG SYN 7.5 PF PI (GLOVE) ×2 IMPLANT
HANDPIECE INTERPULSE COAX TIP (DISPOSABLE) ×3
HEAD FEM 28X+8.5 (Hips) ×2 IMPLANT
HOOD PEEL AWAY FLYTE STAYCOOL (MISCELLANEOUS) ×9 IMPLANT
IMMOBILIZER KNEE 22 UNIV (SOFTGOODS) ×2 IMPLANT
KIT BASIN OR (CUSTOM PROCEDURE TRAY) ×3 IMPLANT
KIT TURNOVER KIT B (KITS) ×3 IMPLANT
LINER ACETAB BM 51X28 (Liner) ×2 IMPLANT
LINER DM PINNACLE 54/47 (Liner) ×2 IMPLANT
LINER DM PINNACLE 60-51 (Liner) ×2 IMPLANT
MANIFOLD NEPTUNE II (INSTRUMENTS) ×3 IMPLANT
NDL SPNL 18GX3.5 QUINCKE PK (NEEDLE) ×1 IMPLANT
NEEDLE SPNL 18GX3.5 QUINCKE PK (NEEDLE) ×3 IMPLANT
NS IRRIG 1000ML POUR BTL (IV SOLUTION) ×3 IMPLANT
PACK TOTAL JOINT (CUSTOM PROCEDURE TRAY) ×3 IMPLANT
PAD ARMBOARD 7.5X6 YLW CONV (MISCELLANEOUS) ×6 IMPLANT
PASSER SUT SWANSON 36MM LOOP (INSTRUMENTS) ×3 IMPLANT
SCREW 6.5MMX25MM (Screw) ×2 IMPLANT
SEALER BIPOLAR AQUA 6.0 (INSTRUMENTS) ×2 IMPLANT
SET HNDPC FAN SPRY TIP SCT (DISPOSABLE) IMPLANT
SUT ETHIBOND NAB CT1 #1 30IN (SUTURE) ×2 IMPLANT
SUT ETHILON 2 0 FS 18 (SUTURE) ×6 IMPLANT
SUT VIC AB 1 CT1 27 (SUTURE) ×3
SUT VIC AB 1 CT1 27XBRD ANTBC (SUTURE) ×2 IMPLANT
SUT VIC AB 1 CTB1 27 (SUTURE) ×2 IMPLANT
SUT VIC AB 1 CTX 36 (SUTURE) ×12
SUT VIC AB 1 CTX36XBRD ANBCTR (SUTURE) IMPLANT
SUT VIC AB 2-0 CT1 27 (SUTURE) ×9
SUT VIC AB 2-0 CT1 TAPERPNT 27 (SUTURE) ×2 IMPLANT
SYR 50ML LL SCALE MARK (SYRINGE) ×3 IMPLANT
SYR TB 1ML LUER SLIP (SYRINGE) ×3 IMPLANT
TOWEL GREEN STERILE (TOWEL DISPOSABLE) ×3 IMPLANT
TOWEL GREEN STERILE FF (TOWEL DISPOSABLE) ×3 IMPLANT
WATER STERILE IRR 1000ML POUR (IV SOLUTION) ×9 IMPLANT

## 2020-06-21 NOTE — H&P (Signed)

## 2020-06-21 NOTE — Transfer of Care (Signed)
Immediate Anesthesia Transfer of Care Note  Patient: [name]  Procedure(s) Performed: LEFT TOTAL HIP REVISION (Left Hip)  Patient Location: PACU  Anesthesia Type:General  Level of Consciousness: awake and alert   Airway & Oxygen Therapy: Patient Spontanous Breathing and Patient connected to face mask oxygen  Post-op Assessment: Report given to RN and Post -op Vital signs reviewed and stable  Post vital signs: Reviewed and stable  Last Vitals:  Vitals Value Taken Time  BP 133/86 06/21/20 1037  Temp 36.6 C 06/21/20 1037  Pulse 85 06/21/20 1041  Resp 13 06/21/20 1041  SpO2 100 % 06/21/20 1041  Vitals shown include unvalidated device data.  Last Pain:  Vitals:   06/21/20 1037  TempSrc:   PainSc: 5       Patients Stated Pain Goal: 3 (05/02/11 1975)  Complications: No complications documented.

## 2020-06-21 NOTE — Progress Notes (Signed)
Preop care provided by OR RN and CRNA.

## 2020-06-21 NOTE — Discharge Instructions (Signed)

## 2020-06-21 NOTE — Plan of Care (Signed)
  Problem: Education: Goal: Knowledge of General Education information will improve Description Including pain rating scale, medication(s)/side effects and non-pharmacologic comfort measures Outcome: Progressing   Problem: Health Behavior/Discharge Planning: Goal: Ability to manage health-related needs will improve Outcome: Progressing   

## 2020-06-21 NOTE — Anesthesia Procedure Notes (Signed)
Procedure Name: Intubation Date/Time: 06/21/2020 7:56 AM Performed by: Reece Agar, CRNA Pre-anesthesia Checklist: Patient identified, Emergency Drugs available, Suction available and Patient being monitored Patient Re-evaluated:Patient Re-evaluated prior to induction Oxygen Delivery Method: Circle System Utilized Preoxygenation: Pre-oxygenation with 100% oxygen Induction Type: IV induction Ventilation: Mask ventilation without difficulty and Oral airway inserted - appropriate to patient size Laryngoscope Size: Mac and 4 Grade View: Grade I Tube type: Oral Number of attempts: 1 Airway Equipment and Method: Stylet and Oral airway Placement Confirmation: ETT inserted through vocal cords under direct vision,  positive ETCO2 and breath sounds checked- equal and bilateral Secured at: 23 cm Tube secured with: Tape Dental Injury: Teeth and Oropharynx as per pre-operative assessment

## 2020-06-21 NOTE — Anesthesia Preprocedure Evaluation (Addendum)
Anesthesia Evaluation  Patient identified by MRN, date of birth, ID band Patient awake    Reviewed: NPO status , Patient's Chart, lab work & pertinent test results  History of Anesthesia Complications Negative for: history of anesthetic complications  Airway Mallampati: II  TM Distance: >3 FB Neck ROM: Full    Dental  (+) Dental Advisory Given, Teeth Intact,    Pulmonary former smoker,  Covid-19 Nucleic Acid Test Results Lab Results      Component                Value               Date                      [id number]              NEGATIVE            06/20/2020                [place]              NEGATIVE            04/10/2020                [place]              NEGATIVE            03/13/2020                [place]              NEGATIVE            12/13/2019                [place]              NEGATIVE            08/20/2019              breath sounds clear to auscultation       Cardiovascular  Rhythm:Regular   1. Left ventricular ejection fraction, by estimation, is 60 to 65%. The  left ventricle has normal function. The left ventricle has no regional  wall motion abnormalities. There is mild concentric left ventricular  hypertrophy. Left ventricular diastolic  parameters were normal.  2. Right ventricular systolic function is normal. The right ventricular  size is normal. Tricuspid regurgitation signal is inadequate for assessing  PA pressure.  3. The mitral valve is normal in structure. No evidence of mitral valve  regurgitation. No evidence of mitral stenosis.  4. The aortic valve was not well visualized. Aortic valve regurgitation  is not visualized. No aortic stenosis is present.  5. Aortic dilatation noted. There is borderline dilatation of the aortic  root, measuring 37 mm. There is mild dilatation of the ascending aorta,  measuring 38 mm.  6. The inferior vena cava is normal in size with <50%  respiratory  variability, suggesting right atrial pressure of 8 mmHg.    Neuro/Psych  Neuromuscular disease negative psych ROS   GI/Hepatic negative GI ROS, Neg liver ROS,   Endo/Other  negative endocrine ROS  Renal/GU negative Renal ROSLab Results      Component                Value               Date  CREATININE               1.02                04/14/2020                Musculoskeletal   Abdominal   Peds  Hematology  (+) Blood dyscrasia, anemia , Lab Results      Component                Value               Date                      WBC                      7.2                 04/14/2020                HGB                      10.5 (L)            04/14/2020                HCT                      33.8 (L)            04/14/2020                MCV                      96.6                04/14/2020                PLT                      400                 04/14/2020              Anesthesia Other Findings   Reproductive/Obstetrics                            Anesthesia Physical Anesthesia Plan  ASA: II  Anesthesia Plan: General   Post-op Pain Management:    Induction: Intravenous  PONV Risk Score and Plan: 2 and Ondansetron and Dexamethasone  Airway Management Planned: Oral ETT  Additional Equipment: None  Intra-op Plan:   Post-operative Plan: Extubation in OR  Informed Consent: I have reviewed the patients History and Physical, chart, labs and discussed the procedure including the risks, benefits and alternatives for the proposed anesthesia with the patient or authorized representative who has indicated his/her understanding and acceptance.     Dental advisory given  Plan Discussed with: CRNA and Surgeon  Anesthesia Plan Comments:         Anesthesia Quick Evaluation

## 2020-06-21 NOTE — Op Note (Addendum)
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  Closed reduction of prosthetic hip dislocation 2.  Revision of left total hip replacement of both acetabular and femoral components  Personnel  Surgeon(s): Leandrew Koyanagi, MD   Anesthesia: general, exparel and marcaine local  Prosthesis: Depuy Acetabulum: Pinnacle Gription Multi-hole cup 60 mm, 25 mm cancellous screw Femoral Head: 51/28 ceramic dual mobility size: +8.5  Date of Service: 06/21/2020   Indication: 54 y.o. year old male with a history of hip pain that has failed conservative management. After risk and benefits of a total hip arthroplasty were explained, the patient elected to proceed with a total hip arthroplasty after voicing understanding.  Procedure:  After informed consent was obtained and understanding of the risk were voiced including but not limited to bleeding, infection, damage to surrounding structures including nerves and vessels, blood clots, leg length inequality, dislocation and the failure to achieve desired results, the operative extremity was marked with verbal confirmation of the patient in the holding area.   The patient was then brought to the operating room.  After general anesthesia was placed with full muscle relaxation I was able to reduce the dislocated hip with flexion and traction.  This required minimal effort unfortunately.  He was then placed into the right lateral decubitus position on the pegboard with all bony prominences well-padded.  The operative limb was then prepped and draped in the usual sterile fashion and preoperative antibiotics were administered.  A time out was performed prior to the start of surgery confirming the correct extremity, preoperative antibiotic administration, as well as team members, implants and instruments  available for the case. Correct surgical site was also confirmed with preoperative radiographs. The previous surgical scar was fully excised.  Dissection was carried down through the subcutaneous tissue.  Retractors were placed.  IT band was incised in line with the incision.  There was a large amount of traumatic seroma which was evacuated.  We then performed a stability test and with the hip at 90 degrees of flexion we were able to dislocate the hip at approximately 40 degrees of internal rotation.  The femoral head was then gently tamped off of the trunnion and the polyethylene liner was removed with the extraction device all without difficulty.  There was no evidence of infection.  We then thoroughly irrigated the entire surgical wound.  The soft tissues were cleared around the acetabular cup.  We then first trialed with a lateralized liner and a dual mobility femoral head in hopes of retaining the acetabular component.  During trialing we did not feel that the dual mobility head with the original acetabular cup imparted any additional stability therefore the decision was made to remove the pre-existing cup in order to better position it.  The cup that was in place was in matched his native abduction angle but was slightly retroverted in relation to his native version.  Retractors were placed for visualization and a cup cutter was used to remove the existing cup.  This was done without any difficulty and with minimal bone loss.  Sequential reaming was then performed up to 59 mm with excellent rim fit.  There was excellent bleeding bone.  A multihole acetabular cup was then impacted into place at his native abduction and 25 degrees of anteversion.  The cup had excellent  fit.  I decided to place a 25 mm cancellous screw superiorly for extra fixation.  The screw had excellent purchase.  The metallic liner was then placed into the acetabular cup.  We then trialed dual mobility femoral heads until we found that a  +8.5 imparted stability without over lengthening the extremity.  Shuck test was normal.  The trial components were then removed.  The surgical site was then again thoroughly irrigated.  A new femoral head dual mobility component was placed onto the trunnion.  The outside diameter was 51 mm with internal 28 mm ceramic head with a titanium sleeve.  The hip was then reduced.  Trialing of the hip show that the hip was stable to 90 degrees of flexion and 60 degrees of internal rotation. The hip was thoroughly irrigated with pulsatile lavage.  1 g of vancomycin powder was placed in the surgical wound as well as topical TXA.  The deep fascia was closed with #1 vicryl, #0 Vicryl for the deep fat layer, and 2.0 Vicryl Plus for the subcutaneous tissue. The skin was closed with 2.0 nylon and dermabond.  A sterile dressing was applied. The patient was awakened and transported to the recovery room in stable condition. All sponge, needle, and instrument counts were correct at the end of the case.  Position: lateral decubitus   Complications: none.  Time Out: performed   Drains/Packing: none  Estimated blood loss: 150 cc  Returned to Recovery Room: in good condition.   Antibiotics: yes   Mechanical VTE (DVT) Prophylaxis: sequential compression devices, TED thigh-high  Chemical VTE (DVT) Prophylaxis: lovenox  Fluid Replacement  Crystalloid: see anesthesia record Blood: none  FFP: none   Specimens Removed: 1 to pathology   Sponge and Instrument Count Correct? yes   PACU: portable radiograph - low AP pelvis  Admission: inpatient status, start PT & OT POD#1  Plan/RTC: Return in 2 weeks for wound check.  Weight Bearing/Load Lower Extremity: full  Posterior hip precautions  N. Eduard Roux, MD [facility] 10:21 AM

## 2020-06-21 NOTE — Plan of Care (Signed)

## 2020-06-21 NOTE — Progress Notes (Signed)
Blood pressure  97/59 repeat 110/68. Alert oriented x4. I will continue to monitor

## 2020-06-22 LAB — BASIC METABOLIC PANEL
Anion gap: 8 (ref 5–15)
BUN: 15 mg/dL (ref 6–20)
CO2: 22 mmol/L (ref 22–32)
Calcium: 8.4 mg/dL — ABNORMAL LOW (ref 8.9–10.3)
Chloride: 105 mmol/L (ref 98–111)
Creatinine, Ser: 1.04 mg/dL (ref 0.61–1.24)
GFR, Estimated: >60 mL/min (ref >60)
Glucose, Bld: 117 mg/dL — ABNORMAL HIGH (ref 70–99)
Potassium: 4.2 mmol/L (ref 3.5–5.1)
Sodium: 135 mmol/L (ref 135–145)

## 2020-06-22 LAB — CBC
HCT: 27.7 % — ABNORMAL LOW (ref 39.0–52.0)
Hemoglobin: 9.2 g/dL — ABNORMAL LOW (ref 13.0–17.0)
MCH: 32.7 pg (ref 26.0–34.0)
MCHC: 33.2 g/dL (ref 30.0–36.0)
MCV: 98.6 fL (ref 80.0–100.0)
Platelets: 186 10*3/uL (ref 150–400)
RBC: 2.81 MIL/uL — ABNORMAL LOW (ref 4.22–5.81)
RDW: 15 % (ref 11.5–15.5)
WBC: 6.4 10*3/uL (ref 4.0–10.5)
nRBC: 0 % (ref 0.0–0.2)

## 2020-06-22 MED ORDER — ASPIRIN EC 81 MG PO TBEC: 81.0000 mg | DELAYED_RELEASE_TABLET | Freq: Two times a day (BID) | ORAL | 0 refills | Status: DC

## 2020-06-22 MED ORDER — METHOCARBAMOL 750 MG PO TABS: 750.0000 mg | ORAL_TABLET | Freq: Two times a day (BID) | ORAL | 3 refills | Status: DC | PRN

## 2020-06-22 MED ORDER — OXYCODONE-ACETAMINOPHEN 5-325 MG PO TABS
1.0000 | ORAL_TABLET | Freq: Three times a day (TID) | ORAL | 0 refills | Status: DC | PRN
Start: 2020-06-22 — End: 2020-07-21

## 2020-06-22 MED ORDER — PANTOPRAZOLE SODIUM 40 MG PO TBEC: 40.0000 mg | DELAYED_RELEASE_TABLET | Freq: Every day | ORAL | Status: DC | PRN

## 2020-06-22 NOTE — Evaluation (Signed)
Physical Therapy Evaluation Patient Details Name: [name] MRN: [id number] DOB: 1966-05-01 Today's Date: 06/22/2020   History of Present Illness  [name] is a 54 y.o. male who presents with a dislocated prosthetic left hip.  2 days ago he states that he was struggling to put on boots and he felt his left hip pop.  He does report that for the last year or so he has had subjective popping and dislocating sensation in the left hip.  Was treated  2 months ago for a prosthetic left hip infection with a head and liner exchange.  He did well postoperatively until this recent event. Now s/p Revision of L THA, WBAT, Posterior Hip Precautions;  has a past medical history of Cauda equina syndrome ([place]) (07/31/2019), Cerebral septic emboli ([place]) (07/30/2019)  Clinical Impression   Pt is s/p THA resulting in the deficits listed below (see PT Problem List). Comes from home where he lives in a single level home with wife and family; Has all needed equipment at home, and is knowledgeable re: managing after hip surgery; Min assist for bed mobility, transfers, and household distance amb; Eager to get home; Questions solicited and answered; Abel to verbalize and simulate how he typically goes up and down stairs;   Pt will benefit from skilled PT to increase their independence and safety with mobility to allow discharge to the venue listed below.      Follow Up Recommendations Supervision/Assistance - 24 hour;Follow surgeon's recommendation for DC plan and follow-up therapies;Home health PT    Equipment Recommendations  None recommended by PT (Pretty well-equipped)    Recommendations for Other Services       Precautions / Restrictions Precautions Precautions: Posterior Hip Precaution Comments: REviewed Posterior Hip Precautions Restrictions Weight Bearing Restrictions: No LLE Weight Bearing: Weight bearing as tolerated      Mobility  Bed Mobility Overal bed mobility: Needs Assistance Bed  Mobility: Supine to Sit;Sit to Supine     Supine to sit: Min assist Sit to supine: Min assist   General bed mobility comments: Min assist to support LLE coming off and getting back into bed; Cues for technique    Transfers Overall transfer level: Needs assistance Equipment used: Rolling walker (2 wheeled) Transfers: Sit to/from Stand Sit to Stand: Min assist         General transfer comment: Min assist to steady RW while he stood from elevated bed; cues to pre-position LLE for post hip prec  Ambulation/Gait Ambulation/Gait assistance: Min guard Gait Distance (Feet): 32 Feet Assistive device: Rolling walker (2 wheeled) Gait Pattern/deviations: Step-through pattern (emerging)     General Gait Details: Walked in room, door to window with RW; cues for hip precautions, especially with turns; Painful, but able to walk near household distances  Financial trader Rankin (Stroke Patients Only)       Balance Overall balance assessment: Mild deficits observed, not formally tested                                           Pertinent Vitals/Pain Pain Assessment: 0-10 Pain Score: 10-Worst pain ever Pain Location: L hip Pain Descriptors / Indicators: Grimacing;Guarding Pain Intervention(s): Monitored during session;RN gave pain meds during session;Repositioned    Home Living Family/patient expects to be discharged to:: Private residence Living  Arrangements: Spouse/significant other;Children Available Help at Discharge: Family;Available 24 hours/day Type of Home: House Home Access: Stairs to enter Entrance Stairs-Rails: Can reach both Entrance Stairs-Number of Steps: 2-3 Home Layout: One level Home Equipment: Walker - 2 wheels;Bedside commode;Adaptive equipment;Crutches      Prior Function Level of Independence: Independent         Comments: Was independent and walking without device; works as a Psychologist, educational   Dominant Hand: Right    Extremity/Trunk Assessment   Upper Extremity Assessment Upper Extremity Assessment: Overall WFL for tasks assessed    Lower Extremity Assessment Lower Extremity Assessment: LLE deficits/detail LLE Deficits / Details: Grossly decr AROM and strength L hip, limited by pain postop       Communication   Communication: No difficulties  Cognition Arousal/Alertness: Awake/alert Behavior During Therapy: WFL for tasks assessed/performed Overall Cognitive Status: Within Functional Limits for tasks assessed                                 General Comments: Slightly impulsive      General Comments General comments (skin integrity, edema, etc.): Very painful, but very focused on getting home, and opted to proceed with PT eval; RN gave pain meds at teh beginning of session; We discussed stair negotiation and car transfers; Opted to use the KI during mobility today    Exercises     Assessment/Plan    PT Assessment Patient needs continued PT services  PT Problem List Decreased strength;Decreased range of motion;Decreased activity tolerance;Decreased mobility;Decreased knowledge of use of DME;Decreased safety awareness;Decreased knowledge of precautions;Pain       PT Treatment Interventions DME instruction;Gait training;Stair training;Functional mobility training;Therapeutic exercise;Therapeutic activities;Patient/family education    PT Goals (Current goals can be found in the Care Plan section)  Acute Rehab PT Goals Patient Stated Goal: get home today PT Goal Formulation: With patient Time For Goal Achievement: 06/29/20 Potential to Achieve Goals: Good    Frequency 7X/week   Barriers to discharge        Co-evaluation               AM-PAC PT "6 Clicks" Mobility  Outcome Measure Help needed turning from your back to your side while in a flat bed without using bedrails?: A Little Help needed moving from lying  on your back to sitting on the side of a flat bed without using bedrails?: A Little Help needed moving to and from a bed to a chair (including a wheelchair)?: A Little Help needed standing up from a chair using your arms (e.g., wheelchair or bedside chair)?: A Little Help needed to walk in hospital room?: A Little Help needed climbing 3-5 steps with a railing? : A Little 6 Click Score: 18    End of Session Equipment Utilized During Treatment: Gait belt;Left knee immobilizer Activity Tolerance: Patient tolerated treatment well Patient left: in bed;with call bell/phone within reach Nurse Communication: Mobility status PT Visit Diagnosis: Unsteadiness on feet (R26.81);Other abnormalities of gait and mobility (R26.89);Pain Pain - Right/Left: Left Pain - part of body: Hip    Time: 6073-7106 PT Time Calculation (min) (ACUTE ONLY): 31 min   Charges:   PT Evaluation $PT Eval Low Complexity: 1 Low PT Treatments $Gait Training: 8-22 mins        Roney Marion, PT  Acute Rehabilitation Services Pager [phone] Office [phone]   [place] 06/22/2020, 11:29 AM

## 2020-06-22 NOTE — Plan of Care (Signed)

## 2020-06-22 NOTE — Discharge Summary (Signed)
Patient ID: [name] MRN: [id number] DOB/AGE: 55/12/1965 55 y.o.  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)   Past Medical History:  Diagnosis Date  . Cauda equina syndrome ([place]) 07/31/2019  . Cerebral septic emboli ([place]) 07/30/2019  . History of chicken pox   . Medical history non-contributory     Surgeries: Procedure(s): LEFT TOTAL HIP REVISION on 06/21/2020   Consultants (if any):   Discharged Condition: Improved  Hospital Course: [name] is an 54 y.o. 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    He was given perioperative antibiotics:  Anti-infectives (From admission, onward)   Start     Dose/Rate Route Frequency Ordered Stop   06/21/20 1400  ceFAZolin (ANCEF) IVPB 2g/100 mL premix        2 g 200 mL/hr over 30 Minutes Intravenous Every 6 hours 06/21/20 1159 06/21/20 2053   06/21/20 1000  rifampin (RIFADIN) capsule 600 mg        600 mg Oral Daily 06/20/20 1910     06/21/20 0949  vancomycin (VANCOCIN) powder  Status:  Discontinued          As needed 06/21/20 0950 06/21/20 1037   06/21/20 0600  ceFAZolin (ANCEF) IVPB 2g/100 mL premix        2 g 200 mL/hr over 30 Minutes Intravenous On call to O.R. 06/21/20 0426 06/21/20 0813   06/20/20 2200  cephALEXin (KEFLEX) capsule 500 mg        500 mg Oral 3 times daily 06/20/20 1910      .  He was given sequential compression devices, early ambulation, and appropriate chemoprophylaxis for DVT prophylaxis.  He benefited maximally from the hospital stay and there were no complications.     Recent vital signs:  Vitals:   06/22/20 0400 06/22/20 0759  BP: 115/64 111/67  Pulse: 80 76  Resp: 17 18  Temp: 98.1 F (36.7 C) 98.3 F (36.8 C)  SpO2: 100% 99%    Recent laboratory studies:  Lab Results  Component Value Date   HGB 9.2 (L) 06/22/2020   HGB 10.5 (L) 04/14/2020   HGB 11.3 (L) 04/12/2020   Lab Results  Component Value Date   WBC 6.4 06/22/2020   PLT 186 06/22/2020   Lab Results  Component Value Date   INR 1.5 (H) 08/20/2019   Lab Results  Component Value Date   NA 135 06/22/2020   K 4.2 06/22/2020   CL 105 06/22/2020   CO2 22 06/22/2020   BUN 15 06/22/2020   CREATININE 1.04 06/22/2020   GLUCOSE 117 (H) 06/22/2020    Discharge Medications:   Allergies as of 06/22/2020      Reactions   Bee Venom Anaphylaxis   Hydrocodone Nausea And Vomiting   (12/10/19 - pt says he has taken without issues)      Medication List    STOP taking these medications   traMADol 50 MG tablet Commonly known as: ULTRAM     TAKE these medications   aspirin EC 81 MG tablet Take 1 tablet (81 mg total) by mouth 2 (two)  times daily as needed. What changed: when to take this   aspirin EC 81 MG tablet Take 1 tablet (81 mg total) by mouth 2 (two) times daily. What changed: You were already taking a medication with the same name, and this prescription was added. Make sure you understand how and when to take each.   cephALEXin 500 MG capsule Commonly known as: KEFLEX Take 1 capsule (500 mg total) by mouth 3 (three) times daily.   cyclobenzaprine 10 MG tablet Commonly known as: FLEXERIL TAKE 1 TABLET BY MOUTH THREE TIMES A DAY AS NEEDED FOR MUSCLE SPASMS What changed: See the new instructions.   enoxaparin 40 MG/0.4ML injection Commonly known as: LOVENOX Inject 0.4 mLs (40 mg total) into the skin daily.   meloxicam 7.5 MG tablet Commonly known as: Mobic 1 tab po daily prn pain What changed:   how much to take  how to take this  when to take  this  reasons to take this  additional instructions   methocarbamol 500 MG tablet Commonly known as: Robaxin Take 1 tablet (500 mg total) by mouth 2 (two) times daily as needed. What changed: Another medication with the same name was added. Make sure you understand how and when to take each.   methocarbamol 750 MG tablet Commonly known as: ROBAXIN Take 1 tablet (750 mg total) by mouth 2 (two) times daily as needed for muscle spasms. What changed: You were already taking a medication with the same name, and this prescription was added. Make sure you understand how and when to take each.   omeprazole 20 MG tablet Commonly known as: PRILOSEC OTC Take 20 mg by mouth daily as needed (heart burn).   oxyCODONE-acetaminophen 5-325 MG tablet Commonly known as: Percocet Take 1-2 tablets by mouth every 8 (eight) hours as needed for severe pain. What changed: when to take this   polyethylene glycol 17 g packet Commonly known as: MIRALAX / GLYCOLAX Take 17 g by mouth 2 (two) times daily.   rifampin 300 MG capsule Commonly known as: Rifadin Take 2 capsules (600 mg total) by mouth daily.   ROLAIDS PO Take 1 tablet by mouth daily as needed (heartburn).   senna-docusate 8.6-50 MG tablet Commonly known as: Senokot-S Take 1 tablet by mouth 2 (two) times daily.            Durable Medical Equipment  (From admission, onward)         Start     Ordered   06/21/20 1245  DME Walker rolling  Once       Question:  Patient needs a walker to treat with the following condition  Answer:  History of hip replacement   06/21/20 1244   06/21/20 1245  DME 3 n 1  Once        06/21/20 1244   06/21/20 1245  DME Bedside commode  Once       Question:  Patient needs a bedside commode to treat with the following condition  Answer:  History of hip replacement   06/21/20 1244          Diagnostic Studies: DG Pelvis Portable  Result Date: 06/21/2020 CLINICAL DATA:  Recent hip dislocation with new  total hip replacement on the left. EXAM: PORTABLE PELVIS 1-2 VIEWS COMPARISON:  June 20, 2020 FINDINGS: Frontal view of mid to lower pelvis and both hips obtained. There is a new total hip replacement on the left with prosthetic components well-seated. Total hip replacement on the right appear  stable. No fracture or dislocation. No erosion. IMPRESSION: There are total hip replacements bilaterally with new total hip replacement on the left. No fracture or dislocation. No erosion. Electronically Signed   By: Lowella Grip III M.D.   On: 06/21/2020 12:48   DG Hip Unilat With Pelvis 2-3 Views Left  Result Date: 06/20/2020 CLINICAL DATA:  Possible dislocation EXAM: DG HIP (WITH OR WITHOUT PELVIS) 2-3V LEFT COMPARISON:  04/09/2020 FINDINGS: There is a left total hip arthroplasty. Dislocation of the femoral component superolaterally with respect to the acetabular component. No periprosthetic fracture identified. Partially imaged right total hip arthroplasty. IMPRESSION: Dislocation of left total hip arthroplasty. Electronically Signed   By: Macy Mis M.D.   On: 06/20/2020 14:47    Disposition: Discharge disposition: 01-Home or Self Care       Discharge Instructions    Call MD / Call 911   Complete by: As directed    If you experience chest pain or shortness of breath, CALL 911 and be transported to the hospital emergency room.  If you develope a fever above 101.5 F, pus (white drainage) or increased drainage or redness at the wound, or calf pain, call your surgeon's office.   Constipation Prevention   Complete by: As directed    Drink plenty of fluids.  Prune juice may be helpful.  You may use a stool softener, such as Colace (over the counter) 100 mg twice a day.  Use MiraLax (over the counter) for constipation as needed.   Driving restrictions   Complete by: As directed    No driving while taking narcotic pain meds.   Increase activity slowly as tolerated   Complete by: As directed         Follow-up Information    Leandrew Koyanagi, MD In 2 weeks.   Specialty: Orthopedic Surgery Why: For suture removal, For wound re-check Contact information: [address] [place] [address] [phone]                Signed: Eduard Roux 06/22/2020, 8:49 AM

## 2020-06-22 NOTE — Plan of Care (Addendum)
Pt stable. Pt discharge education completed, IV removed, pain medication provided for travel. Patient states ride will be available at 2:30pm. Will continue to monitor pat.   Problem: Education: Goal: Knowledge of General Education information will improve Description: Including pain rating scale, medication(s)/side effects and non-pharmacologic comfort measures Outcome: Progressing   Problem: Activity: Goal: Risk for activity intolerance will decrease Outcome: Progressing   Problem: Pain Managment: Goal: General experience of comfort will improve Outcome: Progressing   Problem: Safety: Goal: Ability to remain free from injury will improve Outcome: Progressing   Problem: Skin Integrity: Goal: Risk for impaired skin integrity will decrease Outcome: Progressing

## 2020-06-22 NOTE — Evaluation (Signed)
Occupational Therapy Evaluation Patient Details Name: [name] MRN: [id number] DOB: 08/10/1965 Today's Date: 06/22/2020    History of Present Illness [name] is a 54 y.o. male who presents with a dislocated prosthetic left hip.  2 days ago he states that he was struggling to put on boots and he felt his left hip pop.  He does report that for the last year or so he has had subjective popping and dislocating sensation in the left hip.  Was treated  2 months ago for a prosthetic left hip infection with a head and liner exchange.  He did well postoperatively until this recent event. Now s/p Revision of L THA, WBAT, Posterior Hip Precautions;  has a past medical history of Cauda equina syndrome ([place]) (07/31/2019), Cerebral septic emboli ([place]) (07/30/2019)   Clinical Impression   Patient evaluated by Occupational Therapy with no further acute OT needs identified. All education has been completed and the patient has no further questions. Pt instructed in posterior THA precautions and requires min - mod cues to adhere as he tends to turn toes inward.  He reports he has all AE and DME.  Reviewed safety with all ADLs and functional tranfers.  See below for any follow-up Occupational Therapy or equipment needs. OT is signing off. Thank you for this referral.      Follow Up Recommendations  No OT follow up;Supervision/Assistance - 24 hour    Equipment Recommendations  None recommended by OT    Recommendations for Other Services       Precautions / Restrictions Precautions Precautions: Posterior Hip Precaution Comments: Pt states he is aware of precautions and knows them well upon OT enterance.  Pt able to state 3/3 posterior THA precautions, but requires min cues to adhere to them during function  Restrictions Weight Bearing Restrictions: No LLE Weight Bearing: Weight bearing as tolerated      Mobility Bed Mobility Overal bed mobility: Needs Assistance Bed Mobility: Supine to Sit;Sit  to Supine     Supine to sit: Min guard Sit to supine: Min assist   General bed mobility comments: assist for Lt LE    Transfers Overall transfer level: Needs assistance Equipment used: Rolling walker (2 wheeled) Transfers: Sit to/from [facility] Sit to Stand: Min guard Stand pivot transfers: Min guard       General transfer comment: min guard for safety with bed height elevated.  Reinforced safe method for turning     Balance Overall balance assessment: Mild deficits observed, not formally tested                                         ADL either performed or assessed with clinical judgement   ADL Overall ADL's : Needs assistance/impaired Eating/Feeding: Independent   Grooming: Wash/dry hands;Wash/dry face;Oral care;Brushing hair;Set up;Sitting   Upper Body Bathing: Set up;Sitting   Lower Body Bathing: Min guard;Sit to/from stand;With adaptive equipment (cues to not internally rotate foot )   Upper Body Dressing : Set up;Sitting   Lower Body Dressing: Minimal assistance;Sit to/from stand;Cueing for safety;With adaptive equipment (cuing to avoid internally rotating foot )   Toilet Transfer: Min Dietitian Details (indicate cue type and reason): Pt deferred practice.  Reinforced need to use 3in1 over commode and extend Lt LE when sit <> stand  Toileting- Water quality scientist and Hygiene: Min guard;Sit to/from stand  Tub/Shower Transfer Details (indicate cue type and reason): Pt reports he has built in shower seat and grab bars.  Instructed him to sit to shower for safety and extend Lt hip when moving sit <> stand  Functional mobility during ADLs: Min guard;Rolling walker General ADL Comments: Pt reports he has hip kit at home.  Reinforced with him no bending forward toward feet for ADLs      Vision         Perception     Praxis      Pertinent Vitals/Pain Pain Assessment: 0-10 Pain Score:  10-Worst pain ever Pain Location: L hip Pain Descriptors / Indicators: Grimacing;Guarding;Operative site guarding Pain Intervention(s): Repositioned;Monitored during session;Other (comment) (Pt refused meds, wanting to wait until he discharges home )     Hand Dominance Right   Extremity/Trunk Assessment Upper Extremity Assessment Upper Extremity Assessment: Overall WFL for tasks assessed   Lower Extremity Assessment Lower Extremity Assessment: Defer to PT evaluation LLE Deficits / Details: Grossly decr AROM and strength L hip, limited by pain postop   Cervical / Trunk Assessment Cervical / Trunk Assessment: Normal   Communication Communication Communication: No difficulties   Cognition Arousal/Alertness: Awake/alert Behavior During Therapy: Impulsive (irritable ) Overall Cognitive Status: No family/caregiver present to determine baseline cognitive functioning                                 General Comments: Pt very impulsive and irritable throughout session.  Cautioned him to move slowly and think about movements before performing.  Pt frequently apologizing for his behavior   General Comments  Pt very painful.  He insists his wife can assist him with donning and doffing KI.  He insists he knows precautions well, and is eager to discharge home    Exercises     [place] expects to be discharged to:: Private residence Living Arrangements: Spouse/significant other;Children Available Help at Discharge: Family;Available 24 hours/day Type of Home: House Home Access: Stairs to enter [employer] of Steps: 2-3 Entrance Stairs-Rails: Can reach both Home Layout: One level     Bathroom Shower/Tub: Walk-in shower;Door   [employer] Toilet: Standard Bathroom Accessibility: Yes How Accessible: Accessible via walker Home Equipment: Walker - 2 wheels;Bedside commode;Adaptive equipment;Crutches Adaptive Equipment:  Reacher;Sock aid;Long-handled shoe horn;Long-handled sponge Additional Comments: Pt reports his wife and daughter will provide 24 hour assist as needed       Prior Functioning/Environment Level of Independence: Independent        Comments: Pt report he was fully independent PTA         OT Problem List: Pain;Decreased safety awareness;Decreased knowledge of precautions      OT Treatment/Interventions:      OT Goals(Current goals can be found in the care plan section) Acute Rehab OT Goals Patient Stated Goal: to go home now  OT Goal Formulation: All assessment and education complete, DC therapy  OT Frequency:     Barriers to D/C:            Co-evaluation              AM-PAC OT "6 Clicks" Daily Activity     Outcome Measure Help from another person eating meals?: None Help from another person taking care of personal grooming?: A Little Help from another person toileting, which includes using toliet, bedpan, or urinal?: A Little Help from another person bathing (including washing,  rinsing, drying)?: A Little Help from another person to put on and taking off regular upper body clothing?: A Little Help from another person to put on and taking off regular lower body clothing?: A Little 6 Click Score: 19   End of Session Equipment Utilized During Treatment: Rolling walker;Left knee immobilizer Nurse Communication: Mobility status;Patient requests pain meds (pt requested pain meds end of session due to ready to d/c )  Activity Tolerance: Patient limited by pain Patient left: in bed;with call bell/phone within reach;with nursing/sitter in room  OT Visit Diagnosis: Pain Pain - Right/Left: Left Pain - part of body: Hip                Time: 1898-4210 OT Time Calculation (min): 32 min Charges:  OT General Charges $OT Visit: 1 Visit OT Evaluation $OT Eval Moderate Complexity: 1 Mod OT Treatments $Self Care/Home Management : 8-22 mins  Nilsa Nutting., OTR/L Acute  Rehabilitation Services Pager [phone] Office [phone]   Lucille Passy M 06/22/2020, 2:30 PM

## 2020-06-22 NOTE — Progress Notes (Signed)
   Subjective:  Patient reports pain as mild.  Wants to go home today.  Objective:   VITALS:   Vitals:   06/21/20 1943 06/22/20 0014 06/22/20 0400 06/22/20 0759  BP: (!) 103/59 112/60 115/64 111/67  Pulse: 81 74 80 76  Resp: 18 18 17 18   Temp: (!) 97.5 F (36.4 C) 98 F (36.7 C) 98.1 F (36.7 C) 98.3 F (36.8 C)  TempSrc: Oral Oral Oral Oral  SpO2: 100% 99% 100% 99%  Weight:      Height:        Neurologically intact Sensation intact distally Intact pulses distally Dorsiflexion/Plantar flexion intact Incision: dressing C/D/I and no drainage   Lab Results  Component Value Date   WBC 6.4 06/22/2020   HGB 9.2 (L) 06/22/2020   HCT 27.7 (L) 06/22/2020   MCV 98.6 06/22/2020   PLT 186 06/22/2020     Assessment/Plan:  1 Day Post-Op   - Expected postop acute blood loss anemia - will monitor for symptoms - Up with PT/OT - DVT ppx - SCDs, ambulation, aspirin - WBAT operative extremity, postop hip precautions x 3 months - Pain controlled well - anticipate d/c home today after PT eval   Eduard Roux 06/22/2020, 8:48 AM [phone]

## 2020-06-23 ENCOUNTER — Encounter (HOSPITAL_COMMUNITY): Payer: Self-pay | Admitting: Orthopaedic Surgery

## 2020-06-26 ENCOUNTER — Encounter (HOSPITAL_COMMUNITY): Payer: Self-pay | Admitting: Orthopaedic Surgery

## 2020-06-26 NOTE — Anesthesia Postprocedure Evaluation (Signed)
Anesthesia Post Note  Patient: [name]  Procedure(s) Performed: LEFT TOTAL HIP REVISION (Left Hip)     Patient location during evaluation: PACU Anesthesia Type: General Level of consciousness: awake and alert Pain management: pain level controlled Vital Signs Assessment: post-procedure vital signs reviewed and stable Respiratory status: spontaneous breathing, nonlabored ventilation, respiratory function stable and patient connected to nasal cannula oxygen Cardiovascular status: blood pressure returned to baseline and stable Postop Assessment: no apparent nausea or vomiting Anesthetic complications: no   No complications documented.  Last Vitals:  Vitals:   06/22/20 0400 06/22/20 0759  BP: 115/64 111/67  Pulse: 80 76  Resp: 17 18  Temp: 36.7 C 36.8 C  SpO2: 100% 99%    Last Pain:  Vitals:   06/22/20 1428  TempSrc:   PainSc: 3                  Cosby Proby

## 2020-07-02 ENCOUNTER — Other Ambulatory Visit: Payer: Self-pay

## 2020-07-02 ENCOUNTER — Ambulatory Visit (INDEPENDENT_AMBULATORY_CARE_PROVIDER_SITE_OTHER): Payer: BC Managed Care – PPO | Admitting: Orthopaedic Surgery

## 2020-07-02 ENCOUNTER — Ambulatory Visit (INDEPENDENT_AMBULATORY_CARE_PROVIDER_SITE_OTHER): Payer: BC Managed Care – PPO

## 2020-07-02 ENCOUNTER — Encounter: Payer: Self-pay | Admitting: Orthopaedic Surgery

## 2020-07-02 DIAGNOSIS — T84021A Dislocation of internal left hip prosthesis, initial encounter: Secondary | ICD-10-CM

## 2020-07-02 MED ORDER — OXYCODONE-ACETAMINOPHEN 5-325 MG PO TABS
1.0000 | ORAL_TABLET | Freq: Two times a day (BID) | ORAL | 0 refills | Status: DC | PRN
Start: 2020-07-02 — End: 2020-07-21

## 2020-07-02 NOTE — Progress Notes (Signed)
Post-Op Visit Note   Patient: [name]           Date of Birth: 07/27/1965           MRN: [id number] Visit Date: 07/02/2020 PCP: Lesleigh Noe, MD   Assessment & Plan:  Chief Complaint:  Chief Complaint  Patient presents with  . Left Hip - Pain   Visit Diagnoses:  1. 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  Overall doing well.  He has been very careful with his hip precautions.  Surgical incision is healed.  No signs of infection.  Walking with a walker with an antalgic gait.  X-rays demonstrate stable total hip replacement and stable acetabular component.  Sutures removed today.  I will order home health PT for him.  Percocet refilled today.  Posterior hip precautions for 3 months.  Continue with aspirin for DVT prophylaxis.  Recheck in 4 weeks with standing AP pelvis and lateral hip x-rays.  Follow-Up Instructions: Return in about 4 weeks (around 07/30/2020).   Orders:  Orders Placed This Encounter  Procedures  . XR HIP UNILAT W OR W/O PELVIS 2-3 VIEWS LEFT   Meds ordered this encounter  Medications  . oxyCODONE-acetaminophen (PERCOCET) 5-325 MG tablet    Sig: Take 1-2 tablets by mouth 2 (two) times daily as needed for severe pain.    Dispense:  30 tablet    Refill:  0    Imaging: XR HIP UNILAT W OR W/O PELVIS 2-3 VIEWS LEFT  Result Date: 07/02/2020 Stable left total hip replacement status post revision of acetabular component   PMFS History: Patient Active Problem List   Diagnosis Date Noted  . Hip dislocation, left ([place])   . 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 MSSA bacteremia 04/11/2020  . Acute low back pain 04/11/2020  . Adjustment disorder with depressed mood 11/19/2019  . Left hip pain 10/09/2019  . Blurry vision, left eye 10/09/2019  . Hyponatremia  08/20/2019  . S/P lumbar laminectomy 07/25/2019  . H/O MSSA epidural abscess, L2-L5 07/25/2019  . Anemia 07/18/2019  . Obesity (BMI 35.0-39.9 without comorbidity) 07/18/2019  . Constipation 07/18/2019  . Elevated blood sugar 04/01/2017  . Alcohol abuse, in remission 10/12/2016  . S/P total hip arthroplasty 10/12/2016   Past Medical History:  Diagnosis Date  . Cauda equina syndrome ([place]) 07/31/2019  . Cerebral septic emboli ([place]) 07/30/2019  . History of chicken pox   . Medical history non-contributory     Family History  Problem Relation Age of Onset  . Cancer Father        Hodgkin's disease  . COPD Mother   . Heart attack Maternal Grandfather 80  . Diabetes Neg Hx   . Stroke Neg Hx   . Hypertension Neg Hx   . Hyperlipidemia Neg Hx     Past Surgical History:  Procedure Laterality Date  . CARPAL TUNNEL RELEASE Bilateral 2009  . CATARACT EXTRACTION W/PHACO Left 12/17/2019   Procedure: CATARACT EXTRACTION PHACO AND INTRAOCULAR LENS PLACEMENT (IOC) LEFT;  Surgeon: Eulogio Bear, MD;  Location: [facility] [place];  Service: Ophthalmology;  Laterality: Left;  3.61 0:26.9  . CATARACT EXTRACTION W/PHACO Right 03/17/2020   Procedure: CATARACT EXTRACTION PHACO AND INTRAOCULAR LENS PLACEMENT (IOC) RIGHT 4.41  00:28.1;  Surgeon: Benay Pillow  Elta Guadeloupe, MD;  Location: [facility];  Service: Ophthalmology;  Laterality: Right;  . COLONOSCOPY W/ POLYPECTOMY    . IR FLUORO GUIDED NEEDLE PLC ASPIRATION/INJECTION LOC  04/10/2020  . JOINT REPLACEMENT    . LACERATION REPAIR Right ~ 2012   leg  . LUMBAR LAMINECTOMY/DECOMPRESSION MICRODISCECTOMY N/A 07/24/2019   Procedure: Decompressive Lumbar Laminectomy Lumbar three-four Lumbar four-five for Epidural Abscess;  Surgeon: Kary Kos, MD;  Location: [place];  Service: Neurosurgery;  Laterality: N/A;  . TOTAL HIP ARTHROPLASTY Right 01/11/2013   Procedure: TOTAL HIP ARTHROPLASTY;  Surgeon: Garald Balding, MD;  Location: [place];  Service:  Orthopedics;  Laterality: Right;  . TOTAL HIP ARTHROPLASTY Left 10/12/2016   Procedure: TOTAL HIP ARTHROPLASTY;  Surgeon: Garald Balding, MD;  Location: [place] [facility];  Service: Orthopedics;  Laterality: Left;  . TOTAL HIP ARTHROPLASTY Left 04/11/2020   Procedure: LEFT HIP IRRIGATION AND DEBRIDEMENT WITH HEAD AND POLY SWAP;  Surgeon: Leandrew Koyanagi, MD;  Location: [place];  Service: Orthopedics;  Laterality: Left;  . TOTAL HIP REVISION Left 06/21/2020   Procedure: LEFT TOTAL HIP REVISION;  Surgeon: Leandrew Koyanagi, MD;  Location: [place];  Service: Orthopedics;  Laterality: Left;   Social History   Occupational History  . 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 Smoking status: Former Smoker    Packs/day: 1.00    Years: 14.00    Pack years: 14.00    Types: Cigarettes    Quit date: 07/27/1999    Years since quitting: 20.9  . Smokeless tobacco: Never Used  Vaping Use  . Vaping Use: Never used  Substance and Sexual Activity  . Alcohol use: Yes    Alcohol/week: 24.0 standard drinks    Types: 24 Cans of beer per week    Comment: 4-5 beers per day, case of beer per week  . Drug use: No  . Sexual activity: Yes    Birth control/protection: Post-menopausal

## 2020-07-03 ENCOUNTER — Telehealth: Payer: Self-pay | Admitting: Orthopaedic Surgery

## 2020-07-03 NOTE — Telephone Encounter (Signed)
FAXED

## 2020-07-03 NOTE — Telephone Encounter (Signed)
[name] with [facility] asked to have the office notes from 07/02/20 faxed over please  Fax# [phone]

## 2020-07-08 ENCOUNTER — Telehealth: Payer: Self-pay

## 2020-07-08 ENCOUNTER — Other Ambulatory Visit: Payer: Self-pay | Admitting: Physician Assistant

## 2020-07-08 DIAGNOSIS — D649 Anemia, unspecified: Secondary | ICD-10-CM | POA: Diagnosis not present

## 2020-07-08 DIAGNOSIS — Z7982 Long term (current) use of aspirin: Secondary | ICD-10-CM | POA: Diagnosis not present

## 2020-07-08 DIAGNOSIS — E669 Obesity, unspecified: Secondary | ICD-10-CM | POA: Diagnosis not present

## 2020-07-08 DIAGNOSIS — F4321 Adjustment disorder with depressed mood: Secondary | ICD-10-CM | POA: Diagnosis not present

## 2020-07-08 DIAGNOSIS — Z8614 Personal history of Methicillin resistant Staphylococcus aureus infection: Secondary | ICD-10-CM | POA: Diagnosis not present

## 2020-07-08 DIAGNOSIS — Z87891 Personal history of nicotine dependence: Secondary | ICD-10-CM | POA: Diagnosis not present

## 2020-07-08 DIAGNOSIS — H538 Other visual disturbances: Secondary | ICD-10-CM | POA: Diagnosis not present

## 2020-07-08 DIAGNOSIS — K5900 Constipation, unspecified: Secondary | ICD-10-CM | POA: Diagnosis not present

## 2020-07-08 DIAGNOSIS — M545 Low back pain, unspecified: Secondary | ICD-10-CM | POA: Diagnosis not present

## 2020-07-08 DIAGNOSIS — Z96641 Presence of right artificial hip joint: Secondary | ICD-10-CM | POA: Diagnosis not present

## 2020-07-08 DIAGNOSIS — G834 Cauda equina syndrome: Secondary | ICD-10-CM | POA: Diagnosis not present

## 2020-07-08 DIAGNOSIS — T84011D Broken internal left hip prosthesis, subsequent encounter: Secondary | ICD-10-CM | POA: Diagnosis not present

## 2020-07-08 NOTE — Telephone Encounter (Signed)
T[facility] is requesting verbal orders for pt home health. 1w1,2w3,1w1 She also stated the patient has negative interactions with 2 medications which are tramadol and cyclobenzaprine [phone].

## 2020-07-08 NOTE — Telephone Encounter (Signed)
Please advise 

## 2020-07-08 NOTE — Telephone Encounter (Signed)
Called and spoke with Tiffany and relayed information.

## 2020-07-08 NOTE — Telephone Encounter (Signed)
Yes approve on the home health PT.  I think it is fine to take the tramadol and the Flexeril.

## 2020-07-11 ENCOUNTER — Other Ambulatory Visit: Payer: Self-pay | Admitting: Physician Assistant

## 2020-07-11 ENCOUNTER — Telehealth: Payer: Self-pay

## 2020-07-11 MED ORDER — TRAMADOL HCL 50 MG PO TABS
50.0000 mg | ORAL_TABLET | Freq: Three times a day (TID) | ORAL | 0 refills | Status: DC | PRN
Start: 2020-07-11 — End: 2020-07-11

## 2020-07-11 MED ORDER — TRAMADOL HCL 50 MG PO TABS: 50.0000 mg | ORAL_TABLET | Freq: Three times a day (TID) | ORAL | 0 refills | Status: DC | PRN

## 2020-07-11 NOTE — Telephone Encounter (Signed)
Sent in

## 2020-07-11 NOTE — Telephone Encounter (Signed)
Patient requests Tramadol

## 2020-07-15 DIAGNOSIS — Z8614 Personal history of Methicillin resistant Staphylococcus aureus infection: Secondary | ICD-10-CM | POA: Diagnosis not present

## 2020-07-15 DIAGNOSIS — F4321 Adjustment disorder with depressed mood: Secondary | ICD-10-CM | POA: Diagnosis not present

## 2020-07-15 DIAGNOSIS — E669 Obesity, unspecified: Secondary | ICD-10-CM | POA: Diagnosis not present

## 2020-07-15 DIAGNOSIS — H538 Other visual disturbances: Secondary | ICD-10-CM | POA: Diagnosis not present

## 2020-07-15 DIAGNOSIS — G834 Cauda equina syndrome: Secondary | ICD-10-CM | POA: Diagnosis not present

## 2020-07-15 DIAGNOSIS — Z87891 Personal history of nicotine dependence: Secondary | ICD-10-CM | POA: Diagnosis not present

## 2020-07-15 DIAGNOSIS — T84011D Broken internal left hip prosthesis, subsequent encounter: Secondary | ICD-10-CM | POA: Diagnosis not present

## 2020-07-15 DIAGNOSIS — M545 Low back pain, unspecified: Secondary | ICD-10-CM | POA: Diagnosis not present

## 2020-07-15 DIAGNOSIS — Z7982 Long term (current) use of aspirin: Secondary | ICD-10-CM | POA: Diagnosis not present

## 2020-07-15 DIAGNOSIS — Z96641 Presence of right artificial hip joint: Secondary | ICD-10-CM | POA: Diagnosis not present

## 2020-07-15 DIAGNOSIS — K5900 Constipation, unspecified: Secondary | ICD-10-CM | POA: Diagnosis not present

## 2020-07-15 DIAGNOSIS — D649 Anemia, unspecified: Secondary | ICD-10-CM | POA: Diagnosis not present

## 2020-07-18 DIAGNOSIS — Z96641 Presence of right artificial hip joint: Secondary | ICD-10-CM | POA: Diagnosis not present

## 2020-07-18 DIAGNOSIS — H538 Other visual disturbances: Secondary | ICD-10-CM | POA: Diagnosis not present

## 2020-07-18 DIAGNOSIS — E669 Obesity, unspecified: Secondary | ICD-10-CM | POA: Diagnosis not present

## 2020-07-18 DIAGNOSIS — T84011D Broken internal left hip prosthesis, subsequent encounter: Secondary | ICD-10-CM | POA: Diagnosis not present

## 2020-07-18 DIAGNOSIS — Z8614 Personal history of Methicillin resistant Staphylococcus aureus infection: Secondary | ICD-10-CM | POA: Diagnosis not present

## 2020-07-18 DIAGNOSIS — M545 Low back pain, unspecified: Secondary | ICD-10-CM | POA: Diagnosis not present

## 2020-07-18 DIAGNOSIS — D649 Anemia, unspecified: Secondary | ICD-10-CM | POA: Diagnosis not present

## 2020-07-18 DIAGNOSIS — K5900 Constipation, unspecified: Secondary | ICD-10-CM | POA: Diagnosis not present

## 2020-07-18 DIAGNOSIS — Z87891 Personal history of nicotine dependence: Secondary | ICD-10-CM | POA: Diagnosis not present

## 2020-07-18 DIAGNOSIS — Z7982 Long term (current) use of aspirin: Secondary | ICD-10-CM | POA: Diagnosis not present

## 2020-07-18 DIAGNOSIS — G834 Cauda equina syndrome: Secondary | ICD-10-CM | POA: Diagnosis not present

## 2020-07-18 DIAGNOSIS — F4321 Adjustment disorder with depressed mood: Secondary | ICD-10-CM | POA: Diagnosis not present

## 2020-07-21 ENCOUNTER — Other Ambulatory Visit: Payer: Self-pay | Admitting: Physician Assistant

## 2020-07-21 ENCOUNTER — Telehealth: Payer: Self-pay | Admitting: Orthopaedic Surgery

## 2020-07-21 DIAGNOSIS — Z7982 Long term (current) use of aspirin: Secondary | ICD-10-CM | POA: Diagnosis not present

## 2020-07-21 DIAGNOSIS — E669 Obesity, unspecified: Secondary | ICD-10-CM | POA: Diagnosis not present

## 2020-07-21 DIAGNOSIS — M545 Low back pain, unspecified: Secondary | ICD-10-CM | POA: Diagnosis not present

## 2020-07-21 DIAGNOSIS — Z8614 Personal history of Methicillin resistant Staphylococcus aureus infection: Secondary | ICD-10-CM | POA: Diagnosis not present

## 2020-07-21 DIAGNOSIS — G834 Cauda equina syndrome: Secondary | ICD-10-CM | POA: Diagnosis not present

## 2020-07-21 DIAGNOSIS — Z96641 Presence of right artificial hip joint: Secondary | ICD-10-CM | POA: Diagnosis not present

## 2020-07-21 DIAGNOSIS — F4321 Adjustment disorder with depressed mood: Secondary | ICD-10-CM | POA: Diagnosis not present

## 2020-07-21 DIAGNOSIS — Z87891 Personal history of nicotine dependence: Secondary | ICD-10-CM | POA: Diagnosis not present

## 2020-07-21 DIAGNOSIS — H538 Other visual disturbances: Secondary | ICD-10-CM | POA: Diagnosis not present

## 2020-07-21 DIAGNOSIS — K5900 Constipation, unspecified: Secondary | ICD-10-CM | POA: Diagnosis not present

## 2020-07-21 DIAGNOSIS — D649 Anemia, unspecified: Secondary | ICD-10-CM | POA: Diagnosis not present

## 2020-07-21 DIAGNOSIS — T84011D Broken internal left hip prosthesis, subsequent encounter: Secondary | ICD-10-CM | POA: Diagnosis not present

## 2020-07-21 MED ORDER — OXYCODONE-ACETAMINOPHEN 5-325 MG PO TABS
1.0000 | ORAL_TABLET | Freq: Two times a day (BID) | ORAL | 0 refills | Status: DC | PRN
Start: 2020-07-21 — End: 2020-08-07

## 2020-07-21 NOTE — Telephone Encounter (Signed)
Please advise 

## 2020-07-21 NOTE — Telephone Encounter (Signed)
I called and left voice mail that this was done. 

## 2020-07-21 NOTE — Telephone Encounter (Signed)
Sent in

## 2020-07-21 NOTE — Telephone Encounter (Signed)
Pt would like a refill on his oxycodone! 

## 2020-07-29 ENCOUNTER — Other Ambulatory Visit: Payer: Self-pay

## 2020-07-29 ENCOUNTER — Encounter: Payer: Self-pay | Admitting: Internal Medicine

## 2020-07-29 ENCOUNTER — Ambulatory Visit (INDEPENDENT_AMBULATORY_CARE_PROVIDER_SITE_OTHER): Payer: BC Managed Care – PPO | Admitting: Internal Medicine

## 2020-07-29 DIAGNOSIS — R7881 Bacteremia: Secondary | ICD-10-CM | POA: Diagnosis not present

## 2020-07-29 DIAGNOSIS — B9561 Methicillin susceptible Staphylococcus aureus infection as the cause of diseases classified elsewhere: Secondary | ICD-10-CM

## 2020-07-29 DIAGNOSIS — R197 Diarrhea, unspecified: Secondary | ICD-10-CM | POA: Insufficient documentation

## 2020-07-29 DIAGNOSIS — T8452XD Infection and inflammatory reaction due to internal left hip prosthesis, subsequent encounter: Secondary | ICD-10-CM

## 2020-07-29 NOTE — Assessment & Plan Note (Signed)
I have asked him to bring back a stool specimen for testing for C. difficile.  The intermittent diarrhea may be a nonspecific side effect of rifampin.

## 2020-07-29 NOTE — Assessment & Plan Note (Signed)
I talked to him at length again about the reality that the only test of cure would be to eventually stop antibiotics and wait and see what happens.  He will stay on antibiotics for now and get repeat inflammatory markers today.  He will follow-up in 6 weeks.

## 2020-07-29 NOTE — Progress Notes (Signed)
[facility] for Infectious Disease  Patient Active Problem List   Diagnosis Date Noted  . Left hip prosthetic joint infection ([place]) 04/11/2020    Priority: High  . MSSA bacteremia 04/11/2020    Priority: High  . Acute low back pain 04/11/2020    Priority: High  . H/O MSSA epidural abscess, L2-L5 07/25/2019    Priority: High  . Diarrhea 07/29/2020  . Hip dislocation, left ([place])   . 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 Adjustment disorder with depressed mood 11/19/2019  . Left hip pain 10/09/2019  . Blurry vision, left eye 10/09/2019  . Hyponatremia 08/20/2019  . S/P lumbar laminectomy 07/25/2019  . Anemia 07/18/2019  . Obesity (BMI 35.0-39.9 without comorbidity) 07/18/2019  . Constipation 07/18/2019  . Elevated blood sugar 04/01/2017  . Alcohol abuse, in remission 10/12/2016  . S/P total hip arthroplasty 10/12/2016    Patient's Medications  New Prescriptions   No medications on file  Previous Medications   ASPIRIN EC 81 MG TABLET    Take 1 tablet (81 mg total) by mouth 2 (two) times daily as needed.   ASPIRIN EC 81 MG TABLET    Take 1 tablet (81 mg total) by mouth 2 (two) times daily.   CA CARBONATE-MAG HYDROXIDE (ROLAIDS PO)    Take 1 tablet by mouth daily as needed (heartburn).   CEPHALEXIN (KEFLEX) 500 MG CAPSULE    Take 1 capsule (500 mg total) by mouth 3 (three) times daily.   CYCLOBENZAPRINE (FLEXERIL) 10 MG TABLET    TAKE 1 TABLET BY MOUTH THREE TIMES A DAY AS NEEDED FOR MUSCLE SPASMS   ENOXAPARIN (LOVENOX) 40 MG/0.4ML INJECTION    Inject 0.4 mLs (40 mg total) into the skin daily.   MELOXICAM (MOBIC) 7.5 MG TABLET    1 tab po daily prn pain   METHOCARBAMOL (ROBAXIN) 500 MG TABLET    Take 1 tablet (500 mg total) by mouth 2 (two) times daily as needed.   METHOCARBAMOL (ROBAXIN) 750 MG TABLET    Take 1 tablet (750 mg total) by mouth 2 (two) times daily as needed for muscle spasms.   OMEPRAZOLE (PRILOSEC OTC) 20  MG TABLET    Take 20 mg by mouth daily as needed (heart burn).   OXYCODONE-ACETAMINOPHEN (PERCOCET) 5-325 MG TABLET    Take 1-2 tablets by mouth 2 (two) times daily as needed for severe pain.   POLYETHYLENE GLYCOL (MIRALAX / GLYCOLAX) 17 G PACKET    Take 17 g by mouth 2 (two) times daily.   RIFAMPIN (RIFADIN) 300 MG CAPSULE    Take 600 mg by mouth daily.   SENNA-DOCUSATE (SENOKOT-S) 8.6-50 MG TABLET    Take 1 tablet by mouth 2 (two) times daily.   TRAMADOL (ULTRAM) 50 MG TABLET    Take 1 tablet (50 mg total) by mouth 3 (three) times daily as needed.  Modified Medications   No medications on file  Discontinued Medications   No medications on file    Subjective: [place] is in for his hospital follow-up visit.  He had MSSA bacteremia and lumbar infection late last December.  He has a prosthetic left hip.  Hip aspirate Gram stain and cultures were negative.  He underwent lumbar decompression and laminectomy followed by 54 days of IV antibiotic therapy completing treatment on 09/17/2019. His infection appeared to have been cured. He did well and was slowly improving until he woke up on the  morning of 04/05/2020 with severe left hip pain. He underwent MRI scan which showed a left acetabular osteomyelitis, adjacent myositis and a small iliac is abscess. Repeat blood cultures are growing MSSA again. He has also developed acute severe low back pain again.  He underwent incision and drainage of his left hip.  PICC was placed and he was discharged on IV cefazolin and oral rifampin.  He completed 6 weeks of IV therapy in October before switching to oral cephalexin and rifampin.  He is having some upset stomach and intermittent diarrhea.  He has not had any nausea, vomiting, fever, chills or sweats.  He is still having left hip pain but it has improved and he is feeling better.    He dislocated his left hip requiring repeat surgery in late November.  He underwent incision and drainage with repeat polyexchange.  The  operative note indicated there was a seroma but otherwise no evidence of infection.  No specimens were submitted for stain or culture.  He is still having low back and left hip pain but says the pain is getting better slowly.  Review of Systems: Review of Systems  Constitutional: Negative for chills, diaphoresis and fever.  Gastrointestinal: Positive for diarrhea. Negative for abdominal pain, nausea and vomiting.  Musculoskeletal: Positive for back pain and joint pain.  Skin: Negative for rash.    Past Medical History:  Diagnosis Date  . Cauda equina syndrome ([facility]) 07/31/2019  . Cerebral septic emboli ([facility]) 07/30/2019  . History of chicken pox   . Medical history non-contributory     Social History   Tobacco Use  . Smoking status: Former Smoker    Packs/day: 1.00    Years: 14.00    Pack years: 14.00    Types: Cigarettes    Quit date: 07/27/1999    Years since quitting: 21.0  . Smokeless tobacco: Never Used  Vaping Use  . Vaping Use: Never used  Substance Use Topics  . Alcohol use: Yes    Alcohol/week: 24.0 standard drinks    Types: 24 Cans of beer per week    Comment: 4-5 beers per day, case of beer per week  . Drug use: No    Family History  Problem Relation Age of Onset  . Cancer Father        Hodgkin's disease  . COPD Mother   . Heart attack Maternal Grandfather 80  . Diabetes Neg Hx   . Stroke Neg Hx   . Hypertension Neg Hx   . Hyperlipidemia Neg Hx     Allergies  Allergen Reactions  . Bee Venom Anaphylaxis  . Hydrocodone Nausea And Vomiting    (12/10/19 - pt says he has taken without issues)    Objective: Vitals:   07/29/20 1357  BP: 138/83  Pulse: 68  Temp: 97.7 F (36.5 C)  TempSrc: Oral  Weight: 236 lb (107 kg)   Body mass index is 32.01 kg/m.  Physical Exam Constitutional:      Comments: He remains very concerned about why the staph infection came back and what he needs to do to make sure that it never comes back again.  Cardiovascular:      Rate and Rhythm: Normal rate and regular rhythm.     Heart sounds: No murmur heard.   Pulmonary:     Effort: Pulmonary effort is normal.     Breath sounds: Normal breath sounds.  Musculoskeletal:     Comments: His left hip incision has healed nicely.  Problem List Items Addressed This Visit      High   Left hip prosthetic joint infection ([place])    I talked to him at length again about the reality that the only test of cure would be to eventually stop antibiotics and wait and see what happens.  He will stay on antibiotics for now and get repeat inflammatory markers today.  He will follow-up in 6 weeks.      MSSA bacteremia   Relevant Orders   Sedimentation rate   C-reactive protein     Unprioritized   Diarrhea    I have asked him to bring back a stool specimen for testing for C. difficile.  The intermittent diarrhea may be a nonspecific side effect of rifampin.      Relevant Orders   Clostridium Difficile by PCR([facility])       Michel Bickers, MD [place] [facility] [phone] pager   [phone] cell 07/29/2020, 2:26 PM

## 2020-07-30 ENCOUNTER — Other Ambulatory Visit: Payer: Self-pay | Admitting: Family Medicine

## 2020-07-30 ENCOUNTER — Other Ambulatory Visit: Payer: Self-pay | Admitting: Orthopaedic Surgery

## 2020-07-30 ENCOUNTER — Telehealth: Payer: Self-pay

## 2020-07-30 ENCOUNTER — Other Ambulatory Visit: Payer: Self-pay | Admitting: Physician Assistant

## 2020-07-30 DIAGNOSIS — F4321 Adjustment disorder with depressed mood: Secondary | ICD-10-CM

## 2020-07-30 DIAGNOSIS — T8452XD Infection and inflammatory reaction due to internal left hip prosthesis, subsequent encounter: Secondary | ICD-10-CM

## 2020-07-30 LAB — C-REACTIVE PROTEIN: CRP: 3.6 mg/L (ref <8.0)

## 2020-07-30 LAB — SEDIMENTATION RATE: Sed Rate: 11 mm/h (ref 0–20)

## 2020-07-30 MED ORDER — RIFAMPIN 300 MG PO CAPS: 600.0000 mg | ORAL_CAPSULE | Freq: Every day | ORAL | 0 refills | Status: AC

## 2020-07-30 MED ORDER — CEPHALEXIN 500 MG PO CAPS: 500.0000 mg | ORAL_CAPSULE | Freq: Three times a day (TID) | ORAL | 0 refills | Status: DC

## 2020-07-30 NOTE — Telephone Encounter (Signed)
Patient called office today requesting refills on antibiotics. States pharmacy does not have any prescriptions on file for him. Will send refills for Rifampin and Keflex to [facility].

## 2020-07-31 NOTE — Telephone Encounter (Signed)
Only needs 6 weeks po

## 2020-08-01 ENCOUNTER — Telehealth: Payer: Self-pay

## 2020-08-01 NOTE — Telephone Encounter (Signed)
FYI

## 2020-08-01 NOTE — Telephone Encounter (Signed)
Ok, thanks.

## 2020-08-01 NOTE — Telephone Encounter (Signed)
Anne Ng from [facility] called she stated the patient has declined last 3 pt sessions patient told her "he can do it on his own". CB [phone]

## 2020-08-04 ENCOUNTER — Telehealth: Payer: Self-pay

## 2020-08-04 ENCOUNTER — Other Ambulatory Visit: Payer: Self-pay

## 2020-08-04 ENCOUNTER — Other Ambulatory Visit: Payer: BC Managed Care – PPO

## 2020-08-04 DIAGNOSIS — T8452XD Infection and inflammatory reaction due to internal left hip prosthesis, subsequent encounter: Secondary | ICD-10-CM

## 2020-08-04 DIAGNOSIS — B9561 Methicillin susceptible Staphylococcus aureus infection as the cause of diseases classified elsewhere: Secondary | ICD-10-CM | POA: Diagnosis not present

## 2020-08-04 DIAGNOSIS — R7881 Bacteremia: Secondary | ICD-10-CM | POA: Diagnosis not present

## 2020-08-04 DIAGNOSIS — R197 Diarrhea, unspecified: Secondary | ICD-10-CM

## 2020-08-04 NOTE — Addendum Note (Signed)
Addended by: Caffie Pinto on: 08/04/2020 01:39 PM   Modules accepted: Orders

## 2020-08-04 NOTE — Telephone Encounter (Signed)
Patient in clinic today stating that he has been unable to get his antibiotics from [facility], they told him it wouldn't be ready for pick up until Thursday. RN called [facility] on [address] and they confirm that both the patient's Keflex and Rifampin are ready for pickup. Relayed this to the patient, he verbalizes understanding and states he will go pick up the medications now.   Beryle Flock, RN

## 2020-08-05 LAB — CLOSTRIDIUM DIFFICILE TOXIN B, QUALITATIVE, REAL-TIME PCR: Toxigenic C. Difficile by PCR: NOT DETECTED

## 2020-08-06 ENCOUNTER — Telehealth: Payer: Self-pay | Admitting: Orthopaedic Surgery

## 2020-08-06 NOTE — Telephone Encounter (Signed)
Pt called and would like a refill on oxycodone

## 2020-08-07 ENCOUNTER — Other Ambulatory Visit: Payer: Self-pay | Admitting: Physician Assistant

## 2020-08-07 DIAGNOSIS — T84011D Broken internal left hip prosthesis, subsequent encounter: Secondary | ICD-10-CM | POA: Diagnosis not present

## 2020-08-07 DIAGNOSIS — H538 Other visual disturbances: Secondary | ICD-10-CM | POA: Diagnosis not present

## 2020-08-07 DIAGNOSIS — E669 Obesity, unspecified: Secondary | ICD-10-CM | POA: Diagnosis not present

## 2020-08-07 DIAGNOSIS — Z8614 Personal history of Methicillin resistant Staphylococcus aureus infection: Secondary | ICD-10-CM | POA: Diagnosis not present

## 2020-08-07 DIAGNOSIS — D649 Anemia, unspecified: Secondary | ICD-10-CM | POA: Diagnosis not present

## 2020-08-07 DIAGNOSIS — Z7982 Long term (current) use of aspirin: Secondary | ICD-10-CM | POA: Diagnosis not present

## 2020-08-07 DIAGNOSIS — M545 Low back pain, unspecified: Secondary | ICD-10-CM | POA: Diagnosis not present

## 2020-08-07 DIAGNOSIS — G834 Cauda equina syndrome: Secondary | ICD-10-CM | POA: Diagnosis not present

## 2020-08-07 DIAGNOSIS — K5900 Constipation, unspecified: Secondary | ICD-10-CM | POA: Diagnosis not present

## 2020-08-07 DIAGNOSIS — F4321 Adjustment disorder with depressed mood: Secondary | ICD-10-CM | POA: Diagnosis not present

## 2020-08-07 DIAGNOSIS — Z87891 Personal history of nicotine dependence: Secondary | ICD-10-CM | POA: Diagnosis not present

## 2020-08-07 DIAGNOSIS — Z96641 Presence of right artificial hip joint: Secondary | ICD-10-CM | POA: Diagnosis not present

## 2020-08-07 MED ORDER — OXYCODONE-ACETAMINOPHEN 5-325 MG PO TABS
1.0000 | ORAL_TABLET | Freq: Two times a day (BID) | ORAL | 0 refills | Status: DC | PRN
Start: 2020-08-07 — End: 2020-08-20

## 2020-08-07 NOTE — Telephone Encounter (Signed)
Sent in

## 2020-08-20 ENCOUNTER — Other Ambulatory Visit: Payer: Self-pay | Admitting: Physician Assistant

## 2020-08-20 ENCOUNTER — Telehealth: Payer: Self-pay | Admitting: Orthopaedic Surgery

## 2020-08-20 MED ORDER — DICLOFENAC SODIUM 75 MG PO TBEC: 75.0000 mg | DELAYED_RELEASE_TABLET | Freq: Two times a day (BID) | ORAL | 0 refills | Status: DC | PRN

## 2020-08-20 MED ORDER — OXYCODONE-ACETAMINOPHEN 5-325 MG PO TABS
1.0000 | ORAL_TABLET | Freq: Two times a day (BID) | ORAL | 0 refills | Status: DC | PRN
Start: 2020-08-20 — End: 2020-12-08

## 2020-08-20 NOTE — Telephone Encounter (Signed)
LVM informing pt

## 2020-08-20 NOTE — Telephone Encounter (Signed)
Sent in

## 2020-08-20 NOTE — Telephone Encounter (Signed)
Patient called needing two Rx refilled -pain medication and medication for inflammation. Patient said he was in his truck and could not give me the name of the medication he is taking. The number to contact patient is [phone]

## 2020-09-03 ENCOUNTER — Telehealth: Payer: Self-pay | Admitting: Orthopaedic Surgery

## 2020-09-03 NOTE — Telephone Encounter (Signed)
If so, please send into pharm. Thanks.  

## 2020-09-03 NOTE — Telephone Encounter (Signed)
Patient called requesting refills of oxycodone and tramadol. Please send to pharmacy on file. Patient phone number is [phone].

## 2020-09-04 NOTE — Telephone Encounter (Signed)
He needs come in to see [place] before we can continue to refill pain meds

## 2020-09-04 NOTE — Telephone Encounter (Signed)
Appt made

## 2020-09-09 ENCOUNTER — Ambulatory Visit (INDEPENDENT_AMBULATORY_CARE_PROVIDER_SITE_OTHER): Payer: BC Managed Care – PPO | Admitting: Internal Medicine

## 2020-09-09 ENCOUNTER — Other Ambulatory Visit: Payer: Self-pay

## 2020-09-09 DIAGNOSIS — T8452XD Infection and inflammatory reaction due to internal left hip prosthesis, subsequent encounter: Secondary | ICD-10-CM | POA: Diagnosis not present

## 2020-09-09 MED ORDER — CEPHALEXIN 500 MG PO CAPS: 500.0000 mg | ORAL_CAPSULE | Freq: Two times a day (BID) | ORAL | 5 refills | Status: DC

## 2020-09-09 NOTE — Progress Notes (Signed)
[facility] for Infectious Disease  Patient Active Problem List   Diagnosis Date Noted  . Left hip prosthetic joint infection ([place]) 04/11/2020    Priority: High  . MSSA bacteremia 04/11/2020    Priority: High  . Acute low back pain 04/11/2020    Priority: High  . H/O MSSA epidural abscess, L2-L5 07/25/2019    Priority: High  . Diarrhea 07/29/2020  . Hip dislocation, left ([place])   . 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 Adjustment disorder with depressed mood 11/19/2019  . Left hip pain 10/09/2019  . Blurry vision, left eye 10/09/2019  . Hyponatremia 08/20/2019  . S/P lumbar laminectomy 07/25/2019  . Anemia 07/18/2019  . Obesity (BMI 35.0-39.9 without comorbidity) 07/18/2019  . Constipation 07/18/2019  . Elevated blood sugar 04/01/2017  . Alcohol abuse, in remission 10/12/2016  . S/P total hip arthroplasty 10/12/2016    Patient's Medications  New Prescriptions   No medications on file  Previous Medications   ASPIRIN EC 81 MG TABLET    Take 1 tablet (81 mg total) by mouth 2 (two) times daily as needed.   ASPIRIN EC 81 MG TABLET    Take 1 tablet (81 mg total) by mouth 2 (two) times daily.   CA CARBONATE-MAG HYDROXIDE (ROLAIDS PO)    Take 1 tablet by mouth daily as needed (heartburn).   CYCLOBENZAPRINE (FLEXERIL) 10 MG TABLET    TAKE 1 TABLET BY MOUTH THREE TIMES A DAY AS NEEDED FOR MUSCLE SPASMS   DICLOFENAC (VOLTAREN) 75 MG EC TABLET    Take 1 tablet (75 mg total) by mouth 2 (two) times daily as needed.   ENOXAPARIN (LOVENOX) 40 MG/0.4ML INJECTION    Inject 0.4 mLs (40 mg total) into the skin daily.   ESCITALOPRAM (LEXAPRO) 20 MG TABLET    TAKE 1 TABLET BY MOUTH EVERY DAY   MELOXICAM (MOBIC) 7.5 MG TABLET    1 tab po daily prn pain   METHOCARBAMOL (ROBAXIN) 500 MG TABLET    Take 1 tablet (500 mg total) by mouth 2 (two) times daily as needed.   METHOCARBAMOL (ROBAXIN) 750 MG TABLET    Take 1 tablet (750 mg total) by  mouth 2 (two) times daily as needed for muscle spasms.   OMEPRAZOLE (PRILOSEC OTC) 20 MG TABLET    Take 20 mg by mouth daily as needed (heart burn).   OXYCODONE-ACETAMINOPHEN (PERCOCET) 5-325 MG TABLET    Take 1-2 tablets by mouth 2 (two) times daily as needed for severe pain.   POLYETHYLENE GLYCOL (MIRALAX / GLYCOLAX) 17 G PACKET    Take 17 g by mouth 2 (two) times daily.   SENNA-DOCUSATE (SENOKOT-S) 8.6-50 MG TABLET    Take 1 tablet by mouth 2 (two) times daily.   TRAMADOL (ULTRAM) 50 MG TABLET    Take 1 tablet (50 mg total) by mouth 3 (three) times daily as needed.  Modified Medications   Modified Medication Previous Medication   CEPHALEXIN (KEFLEX) 500 MG CAPSULE cephALEXin (KEFLEX) 500 MG capsule      Take 1 capsule (500 mg total) by mouth 2 (two) times daily.    Take 1 capsule (500 mg total) by mouth 3 (three) times daily.  Discontinued Medications   RIFAMPIN (RIFADIN) 300 MG CAPSULE    Take 300 mg by mouth.    Subjective: [name] is in for his hospital follow-up visit.  He had MSSA bacteremia and lumbar infection in  December of 2020.  He has a prosthetic left hip.  Hip aspirate Gram stain and cultures were negative.  He underwent lumbar decompression and laminectomy followed by 54 days of IV antibiotic therapy completing treatment on 09/17/2019. His infection appeared to have been cured. He did well and was slowly improving until he woke up on the morning of 04/05/2020 with severe left hip pain. He underwent MRI scan which showed a left acetabular osteomyelitis, adjacent myositis and a small iliac is abscess. Repeat blood cultures are growing MSSA again. He has also developed acute severe low back pain again.  He underwent incision and drainage of his left hip.  PICC was placed and he was discharged on IV cefazolin and oral rifampin.  He completed 6 weeks of IV therapy in October before switching to oral cephalexin and rifampin.  He is having some upset stomach and intermittent diarrhea.  He has  not had any nausea, vomiting, fever, chills or sweats.  He is still having left hip pain but it has improved and he is feeling better.    He dislocated his left hip requiring repeat surgery in late November.  He underwent incision and drainage with repeat polyexchange.  The operative note indicated there was a seroma but otherwise no evidence of infection.  No specimens were submitted for stain or culture.  He is still having low back and left hip pain but says the pain is getting better slowly.  Review of Systems: Review of Systems  Constitutional: Negative for chills, diaphoresis and fever.  Gastrointestinal: Positive for diarrhea. Negative for abdominal pain, nausea and vomiting.  Musculoskeletal: Positive for back pain and joint pain.  Skin: Negative for rash.    Past Medical History:  Diagnosis Date  . Cauda equina syndrome ([place]) 07/31/2019  . Cerebral septic emboli ([place]) 07/30/2019  . History of chicken pox   . Medical history non-contributory     Social History   Tobacco Use  . Smoking status: Former Smoker    Packs/day: 1.00    Years: 14.00    Pack years: 14.00    Types: Cigarettes    Quit date: 07/27/1999    Years since quitting: 21.1  . Smokeless tobacco: Never Used  Vaping Use  . Vaping Use: Never used  Substance Use Topics  . Alcohol use: Yes    Alcohol/week: 24.0 standard drinks    Types: 24 Cans of beer per week    Comment: 4-5 beers per day, case of beer per week  . Drug use: No    Family History  Problem Relation Age of Onset  . Cancer Father        Hodgkin's disease  . COPD Mother   . Heart attack Maternal Grandfather 80  . Diabetes Neg Hx   . Stroke Neg Hx   . Hypertension Neg Hx   . Hyperlipidemia Neg Hx     Allergies  Allergen Reactions  . Bee Venom Anaphylaxis  . Hydrocodone Nausea And Vomiting    (12/10/19 - pt says he has taken without issues)    Objective: Vitals:   09/09/20 1454  BP: 129/80  Pulse: 91  Temp: 97.9 F (36.6 C)   TempSrc: Oral  Weight: 253 lb (114.8 kg)   Body mass index is 34.31 kg/m.  Physical Exam Constitutional:      Comments: He tells me that what he has been through over the last year and a half "really sucks".  He says that he has been conditioned to expect  the worst.  He is very worried that the staph infection in his hip will come back.  He says that he is afraid every time he puts a shoe or boot on his left foot because he is worried that his hip will dislocate again.  Cardiovascular:     Rate and Rhythm: Normal rate and regular rhythm.     Heart sounds: No murmur heard.   Pulmonary:     Effort: Pulmonary effort is normal.     Breath sounds: Normal breath sounds.    Sed Rate  Date Value  07/29/2020 11 mm/h  04/09/2020 78 mm/hr (H)  10/09/2019 87 mm/hr (H)   CRP  Date Value  07/29/2020 3.6 mg/L  04/10/2020 20.5 mg/dL (H)  09/03/2019 68.5 mg/L (H)     Problem List Items Addressed This Visit      High   Left hip prosthetic joint infection ([place])    He is doing better on chronic suppressive antibiotic therapy for relapsed MSSA prosthetic hip infection.  His inflammatory markers have returned to normal but he is aware that this does not prove that his infection is completely cured.  I suspect that his diarrhea is caused by his antibiotics.  His C. difficile was negative last month.  I talked to him about options including stopping all antibiotic therapy now, scaling back on his antibiotics or continuing his current 2 drug regimen.  He would like to try cutting down to cephalexin alone.  He will follow-up in 6 weeks.      Relevant Medications   cephALEXin (KEFLEX) 500 MG capsule       Michel Bickers, MD [facility] for Infect[facility] [phone] pager   [phone] cell 09/09/2020, 3:23 PM

## 2020-09-09 NOTE — Assessment & Plan Note (Signed)
He is doing better on chronic suppressive antibiotic therapy for relapsed MSSA prosthetic hip infection.  His inflammatory markers have returned to normal but he is aware that this does not prove that his infection is completely cured.  I suspect that his diarrhea is caused by his antibiotics.  His C. difficile was negative last month.  I talked to him about options including stopping all antibiotic therapy now, scaling back on his antibiotics or continuing his current 2 drug regimen.  He would like to try cutting down to cephalexin alone.  He will follow-up in 6 weeks.

## 2020-09-10 ENCOUNTER — Ambulatory Visit (INDEPENDENT_AMBULATORY_CARE_PROVIDER_SITE_OTHER): Payer: BC Managed Care – PPO | Admitting: Orthopaedic Surgery

## 2020-09-10 ENCOUNTER — Ambulatory Visit (INDEPENDENT_AMBULATORY_CARE_PROVIDER_SITE_OTHER): Payer: BC Managed Care – PPO

## 2020-09-10 ENCOUNTER — Encounter: Payer: Self-pay | Admitting: Orthopaedic Surgery

## 2020-09-10 DIAGNOSIS — M25552 Pain in left hip: Secondary | ICD-10-CM | POA: Diagnosis not present

## 2020-09-10 MED ORDER — TRAMADOL HCL 50 MG PO TABS
50.0000 mg | ORAL_TABLET | Freq: Every day | ORAL | 3 refills | Status: DC | PRN
Start: 2020-09-10 — End: 2020-11-14

## 2020-09-10 NOTE — Progress Notes (Signed)
Post-Op Visit Note   Patient: [name]           Date of Birth: 05/18/1966           MRN: [id number] Visit Date: 09/10/2020 PCP: Lesleigh Noe, MD   Assessment & Plan:  Chief Complaint:  Chief Complaint  Patient presents with  . Left Hip - Pain   Visit Diagnoses:  1. Pain in left hip     Plan:   [name] is almost 12 weeks status post acetabular revision and dual mobility head placement for dislocation.  He is overall doing okay.  He no longer has the instability sensation.  He is doing home exercises.  He has numbness in the buttock.  He has weakness with going up and down stairs.  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  Left hip shows fully healed surgical scar.  He has good range of motion.  I do not feel any clunking or instability.  Neurovascular intact.  X-rays demonstrate stable total hip replacement.  From my standpoint he should continue to practice posterior hip precautions.  They should continue with his strengthening program.  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  He may need to consider applying for long-term disability.  I would like to recheck him in 3 months with standing AP pelvis and lateral hip.  Tramadol prescribed today.  Follow-Up Instructions: Return in about 3 months (around 12/08/2020).   Orders:  Orders Placed This Encounter  Procedures  . XR HIP UNILAT W OR W/O PELVIS 2-3 VIEWS LEFT   Meds ordered this encounter  Medications  . traMADol (ULTRAM) 50 MG tablet    Sig: Take 1-2 tablets (50-100 mg total) by mouth daily as needed.    Dispense:  30 tablet    Refill:  3    Imaging: XR HIP UNILAT W OR W/O PELVIS 2-3 VIEWS LEFT  Result Date: 09/10/2020 Stable left acetabular component status post revision   PMFS History: Patient Active Problem List   Diagnosis Date Noted  . Diarrhea 07/29/2020  . Hip dislocation, left ([place])   .  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 Left hip prosthetic joint infection ([place]) 04/11/2020  . MSSA bacteremia 04/11/2020  . Acute low back pain 04/11/2020  . Adjustment disorder with depressed mood 11/19/2019  . Left hip pain 10/09/2019  . Blurry vision, left eye 10/09/2019  . Hyponatremia 08/20/2019  . S/P lumbar laminectomy 07/25/2019  . H/O MSSA epidural abscess, L2-L5 07/25/2019  . Anemia 07/18/2019  . Obesity (BMI 35.0-39.9 without comorbidity) 07/18/2019  . Constipation 07/18/2019  . Elevated blood sugar 04/01/2017  . Alcohol abuse, in remission 10/12/2016  . S/P total hip arthroplasty 10/12/2016   Past Medical History:  Diagnosis Date  . Cauda equina syndrome ([place]) 07/31/2019  . Cerebral septic emboli ([place]) 07/30/2019  . History of chicken pox   . Medical history non-contributory     Family History  Problem Relation Age of Onset  . Cancer Father        Hodgkin's disease  . COPD Mother   . Heart attack Maternal Grandfather 80  . Diabetes Neg Hx   . Stroke Neg Hx   . Hypertension Neg Hx   . Hyperlipidemia Neg Hx     Past Surgical History:  Procedure Laterality  Date  . CARPAL TUNNEL RELEASE Bilateral 2009  . CATARACT EXTRACTION W/PHACO Left 12/17/2019   Procedure: CATARACT EXTRACTION PHACO AND INTRAOCULAR LENS PLACEMENT (IOC) LEFT;  Surgeon: Eulogio Bear, MD;  Location: [facility];  Service: Ophthalmology;  Laterality: Left;  3.61 0:26.9  . CATARACT EXTRACTION W/PHACO Right 03/17/2020   Procedure: CATARACT EXTRACTION PHACO AND INTRAOCULAR LENS PLACEMENT (IOC) RIGHT 4.41  00:28.1;  Surgeon: Eulogio Bear, MD;  Location: [facility];  Service: Ophthalmology;  Laterality: Right;  . COLONOSCOPY W/ POLYPECTOMY    . IR FLUORO GUIDED NEEDLE PLC ASPIRATION/INJECTION LOC  04/10/2020  . JOINT REPLACEMENT    . LACERATION REPAIR Right ~ 2012   leg  . LUMBAR LAMINECTOMY/DECOMPRESSION MICRODISCECTOMY N/A  07/24/2019   Procedure: Decompressive Lumbar Laminectomy Lumbar three-four Lumbar four-five for Epidural Abscess;  Surgeon: Kary Kos, MD;  Location: [facility];  Service: Neurosurgery;  Laterality: N/A;  . TOTAL HIP ARTHROPLASTY Right 01/11/2013   Procedure: TOTAL HIP ARTHROPLASTY;  Surgeon: Garald Balding, MD;  Location: [facility];  Service: Orthopedics;  Laterality: Right;  . TOTAL HIP ARTHROPLASTY Left 10/12/2016   Procedure: TOTAL HIP ARTHROPLASTY;  Surgeon: Garald Balding, MD;  Location: [facility];  Service: Orthopedics;  Laterality: Left;  . TOTAL HIP ARTHROPLASTY Left 04/11/2020   Procedure: LEFT HIP IRRIGATION AND DEBRIDEMENT WITH HEAD AND POLY SWAP;  Surgeon: Leandrew Koyanagi, MD;  Location: [facility];  Service: Orthopedics;  Laterality: Left;  . TOTAL HIP REVISION Left 06/21/2020   Procedure: LEFT TOTAL HIP REVISION;  Surgeon: Leandrew Koyanagi, MD;  Location: [facility];  Service: Orthopedics;  Laterality: Left;   Social History   Occupational History  . 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 Smoking status: Former Smoker    Packs/day: 1.00    Years: 14.00    Pack years: 14.00    Types: Cigarettes    Quit date: 07/27/1999    Years since quitting: 21.1  . Smokeless tobacco: Never Used  Vaping Use  . Vaping Use: Never used  Substance and Sexual Activity  . Alcohol use: Yes    Alcohol/week: 24.0 standard drinks    Types: 24 Cans of beer per week    Comment: 4-5 beers per day, case of beer per week  . Drug use: No  . Sexual activity: Yes    Birth control/protection: Post-menopausal

## 2020-10-07 ENCOUNTER — Other Ambulatory Visit: Payer: Self-pay | Admitting: Family Medicine

## 2020-10-07 ENCOUNTER — Other Ambulatory Visit: Payer: Self-pay | Admitting: Physician Assistant

## 2020-10-07 ENCOUNTER — Other Ambulatory Visit: Payer: Self-pay | Admitting: Orthopaedic Surgery

## 2020-10-08 NOTE — Telephone Encounter (Signed)
Only needs for 6 weeks post-op

## 2020-10-21 ENCOUNTER — Ambulatory Visit (INDEPENDENT_AMBULATORY_CARE_PROVIDER_SITE_OTHER): Payer: BC Managed Care – PPO | Admitting: Internal Medicine

## 2020-10-21 ENCOUNTER — Encounter: Payer: Self-pay | Admitting: Internal Medicine

## 2020-10-21 ENCOUNTER — Other Ambulatory Visit: Payer: Self-pay

## 2020-10-21 DIAGNOSIS — T8452XD Infection and inflammatory reaction due to internal left hip prosthesis, subsequent encounter: Secondary | ICD-10-CM

## 2020-10-21 NOTE — Assessment & Plan Note (Signed)
His inflammatory markers have normalized but he is aware that this does not prove for certain that his infection is gone.  He would like to get repeat lab work today, continue his antibiotics and follow-up in 2 months.

## 2020-10-21 NOTE — Progress Notes (Signed)
[facility] for Infectious Disease  Patient Active Problem List   Diagnosis Date Noted  . Left hip prosthetic joint infection ([place]) 04/11/2020    Priority: High  . MSSA bacteremia 04/11/2020    Priority: High  . Acute low back pain 04/11/2020    Priority: High  . H/O MSSA epidural abscess, L2-L5 07/25/2019    Priority: High  . Diarrhea 07/29/2020  . Hip dislocation, left ([place])   . 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 Adjustment disorder with depressed mood 11/19/2019  . Left hip pain 10/09/2019  . Blurry vision, left eye 10/09/2019  . Hyponatremia 08/20/2019  . S/P lumbar laminectomy 07/25/2019  . Anemia 07/18/2019  . Obesity (BMI 35.0-39.9 without comorbidity) 07/18/2019  . Constipation 07/18/2019  . Elevated blood sugar 04/01/2017  . Alcohol abuse, in remission 10/12/2016  . S/P total hip arthroplasty 10/12/2016    Patient's Medications  New Prescriptions   No medications on file  Previous Medications   ASPIRIN EC 81 MG TABLET    Take 1 tablet (81 mg total) by mouth 2 (two) times daily as needed.   ASPIRIN EC 81 MG TABLET    Take 1 tablet (81 mg total) by mouth 2 (two) times daily.   CA CARBONATE-MAG HYDROXIDE (ROLAIDS PO)    Take 1 tablet by mouth daily as needed (heartburn).   CEPHALEXIN (KEFLEX) 500 MG CAPSULE    Take 1 capsule (500 mg total) by mouth 2 (two) times daily.   CYCLOBENZAPRINE (FLEXERIL) 10 MG TABLET    Take 1 tablet (10 mg total) by mouth 3 (three) times daily as needed.   DICLOFENAC (VOLTAREN) 75 MG EC TABLET    Take 1 tablet (75 mg total) by mouth 2 (two) times daily as needed.   ENOXAPARIN (LOVENOX) 40 MG/0.4ML INJECTION    Inject 0.4 mLs (40 mg total) into the skin daily.   ESCITALOPRAM (LEXAPRO) 20 MG TABLET    TAKE 1 TABLET BY MOUTH EVERY DAY   MELOXICAM (MOBIC) 7.5 MG TABLET    1 tab po daily prn pain   METHOCARBAMOL (ROBAXIN) 500 MG TABLET    Take 1 tablet (500 mg total) by mouth 2 (two)  times daily as needed.   METHOCARBAMOL (ROBAXIN) 750 MG TABLET    Take 1 tablet (750 mg total) by mouth 2 (two) times daily as needed for muscle spasms.   OMEPRAZOLE (PRILOSEC OTC) 20 MG TABLET    Take 20 mg by mouth daily as needed (heart burn).   OXYCODONE-ACETAMINOPHEN (PERCOCET) 5-325 MG TABLET    Take 1-2 tablets by mouth 2 (two) times daily as needed for severe pain.   POLYETHYLENE GLYCOL (MIRALAX / GLYCOLAX) 17 G PACKET    Take 17 g by mouth 2 (two) times daily.   RIFAMPIN (RIFADIN) 300 MG CAPSULE    Take 600 mg by mouth daily.   SENNA-DOCUSATE (SENOKOT-S) 8.6-50 MG TABLET    Take 1 tablet by mouth 2 (two) times daily.   TRAMADOL (ULTRAM) 50 MG TABLET    Take 1 tablet (50 mg total) by mouth 3 (three) times daily as needed.   TRAMADOL (ULTRAM) 50 MG TABLET    Take 1-2 tablets (50-100 mg total) by mouth daily as needed.  Modified Medications   No medications on file  Discontinued Medications   No medications on file    Subjective: [name] is in for his hospital follow-up visit.  He had  MSSA bacteremia and lumbar infection in December of 2020.  He has a prosthetic left hip.  Hip aspirate Gram stain and cultures were negative.  He underwent lumbar decompression and laminectomy followed by 54 days of IV antibiotic therapy completing treatment on 09/17/2019. His infection appeared to have been cured. He did well and was slowly improving until he woke up on the morning of 04/05/2020 with severe left hip pain. He underwent MRI scan which showed a left acetabular osteomyelitis, adjacent myositis and a small iliac is abscess. Repeat blood cultures are growing MSSA again. He has also developed acute severe low back pain again.  He underwent incision and drainage of his left hip.  PICC was placed and he was discharged on IV cefazolin and oral rifampin.  He completed 6 weeks of IV therapy in October before switching to oral cephalexin and rifampin.  He has 1 soft bowel movement daily that he attributes to his  antibiotics.  He has not had any nausea, vomiting, fever, chills or sweats.  He is still having left hip and back pain that he says is unchanged  He dislocated his left hip requiring repeat surgery in late November.  He underwent incision and drainage with repeat polyexchange.  The operative note indicated there was a seroma but otherwise no evidence of infection.  No specimens were submitted for stain or culture.   Review of Systems: Review of Systems  Constitutional: Negative for chills, diaphoresis and fever.  Gastrointestinal: Negative for abdominal pain, diarrhea, nausea and vomiting.  Musculoskeletal: Positive for back pain and joint pain.  Skin: Negative for rash.    Past Medical History:  Diagnosis Date  . Cauda equina syndrome ([place]) 07/31/2019  . Cerebral septic emboli ([place]) 07/30/2019  . History of chicken pox   . Medical history non-contributory     Social History   Tobacco Use  . Smoking status: Former Smoker    Packs/day: 1.00    Years: 14.00    Pack years: 14.00    Types: Cigarettes    Quit date: 07/27/1999    Years since quitting: 21.2  . Smokeless tobacco: Never Used  Vaping Use  . Vaping Use: Never used  Substance Use Topics  . Alcohol use: Not Currently    Alcohol/week: 24.0 standard drinks    Types: 24 Cans of beer per week    Comment: 4-5 beers per day, case of beer per week  . Drug use: No    Family History  Problem Relation Age of Onset  . Cancer Father        Hodgkin's disease  . COPD Mother   . Heart attack Maternal Grandfather 80  . Diabetes Neg Hx   . Stroke Neg Hx   . Hypertension Neg Hx   . Hyperlipidemia Neg Hx     Allergies  Allergen Reactions  . Bee Venom Anaphylaxis  . Hydrocodone Nausea And Vomiting    (12/10/19 - pt says he has taken without issues)    Objective: Vitals:   10/21/20 1455  BP: 127/84  Pulse: 70  Temp: 97.6 F (36.4 C)  TempSrc: Oral  SpO2: 98%  Weight: 260 lb (117.9 kg)  Height: 6' (1.829 m)   Body mass  index is 35.26 kg/m.  Physical Exam Constitutional:      General: He is not in acute distress. Cardiovascular:     Rate and Rhythm: Normal rate and regular rhythm.     Heart sounds: No murmur heard.   Pulmonary:  Effort: Pulmonary effort is normal.     Breath sounds: Normal breath sounds.    Sed Rate  Date Value  07/29/2020 11 mm/h  04/09/2020 78 mm/hr (H)  10/09/2019 87 mm/hr (H)   CRP  Date Value  07/29/2020 3.6 mg/L  04/10/2020 20.5 mg/dL (H)  09/03/2019 68.5 mg/L (H)     Problem List Items Addressed This Visit      High   Left hip prosthetic joint infection ([place])    His inflammatory markers have normalized but he is aware that this does not prove for certain that his infection is gone.  He would like to get repeat lab work today, continue his antibiotics and follow-up in 2 months.      Relevant Orders   C-reactive protein   Sedimentation rate       Michel Bickers, MD [facility] for Infectious [place] [phone] pager   [phone] cell 10/21/2020, 3:15 PM

## 2020-10-22 LAB — C-REACTIVE PROTEIN: CRP: 11.1 mg/L — ABNORMAL HIGH (ref <8.0)

## 2020-10-22 LAB — SEDIMENTATION RATE: Sed Rate: 14 mm/h (ref 0–20)

## 2020-10-28 ENCOUNTER — Other Ambulatory Visit: Payer: Self-pay | Admitting: Orthopaedic Surgery

## 2020-11-12 ENCOUNTER — Other Ambulatory Visit: Payer: Self-pay | Admitting: Family Medicine

## 2020-11-12 ENCOUNTER — Other Ambulatory Visit: Payer: Self-pay | Admitting: Orthopaedic Surgery

## 2020-11-12 NOTE — Telephone Encounter (Signed)
Last office visit 03/27/2020 for s/p lumbar laminectomy.  Last refilled 10/07/2020 for #60 with 1 refill.  No future appointments with PCP.

## 2020-11-13 NOTE — Telephone Encounter (Signed)
Please clarify if patient is still taking Robaxin.

## 2020-11-13 NOTE — Telephone Encounter (Signed)
Ok to send in with same previous instructions

## 2020-11-13 NOTE — Telephone Encounter (Signed)
We are only able to call this in. Can you please just mark as approved?

## 2020-11-17 NOTE — Telephone Encounter (Signed)
Left VM for pt to return call.

## 2020-11-18 NOTE — Telephone Encounter (Signed)
Pt called in returning phone call, he still taking the Robaxin.

## 2020-11-18 NOTE — Telephone Encounter (Signed)
Robaxin and Flexeril act similarly. Would recommend choosing one and using this for muscle spasms.   If he feels he still needs both would recommend f/u visit to discuss how he is taking and why.

## 2020-11-27 NOTE — Telephone Encounter (Signed)
Patient called left message for them to call back about the muscle spasm medications. Left number for call back.

## 2020-11-27 NOTE — Telephone Encounter (Addendum)
Patient states that he needs both Robaxin and Flexeril, Patient states that he has had two hip replacements since the last time he saw Dr. Einar Pheasant. Would like to set up an appointment to see Dr. Einar Pheasant for pain medication as he has been taken off them by Dr. Sherrian Divers. Patient scheduled for an appointment Monday 12/08/2020 with Dr. Einar Pheasant.

## 2020-12-08 ENCOUNTER — Encounter: Payer: Self-pay | Admitting: Family Medicine

## 2020-12-08 ENCOUNTER — Ambulatory Visit (INDEPENDENT_AMBULATORY_CARE_PROVIDER_SITE_OTHER): Payer: BC Managed Care – PPO | Admitting: Family Medicine

## 2020-12-08 ENCOUNTER — Other Ambulatory Visit: Payer: Self-pay

## 2020-12-08 VITALS — BP 132/76 | HR 9 | Temp 98.4°F | Resp 18 | Wt 269.2 lb

## 2020-12-08 DIAGNOSIS — M545 Low back pain, unspecified: Secondary | ICD-10-CM

## 2020-12-08 DIAGNOSIS — Z9889 Other specified postprocedural states: Secondary | ICD-10-CM

## 2020-12-08 DIAGNOSIS — T8452XD Infection and inflammatory reaction due to internal left hip prosthesis, subsequent encounter: Secondary | ICD-10-CM

## 2020-12-08 DIAGNOSIS — E786 Lipoprotein deficiency: Secondary | ICD-10-CM | POA: Diagnosis not present

## 2020-12-08 DIAGNOSIS — E669 Obesity, unspecified: Secondary | ICD-10-CM | POA: Diagnosis not present

## 2020-12-08 DIAGNOSIS — D649 Anemia, unspecified: Secondary | ICD-10-CM

## 2020-12-08 DIAGNOSIS — Z96642 Presence of left artificial hip joint: Secondary | ICD-10-CM

## 2020-12-08 DIAGNOSIS — G8929 Other chronic pain: Secondary | ICD-10-CM

## 2020-12-08 MED ORDER — LIDOCAINE 5 % EX OINT: 1.0000 "application " | TOPICAL_OINTMENT | CUTANEOUS | 0 refills | Status: DC | PRN

## 2020-12-08 MED ORDER — LIDOCAINE 5 % EX PTCH: 1.0000 | MEDICATED_PATCH | CUTANEOUS | 0 refills | Status: DC

## 2020-12-08 NOTE — Assessment & Plan Note (Signed)
ASCVD 6.2% currently. Will recheck for screening.

## 2020-12-08 NOTE — Progress Notes (Signed)
Subjective:     BLADYN TIPPS is a 55 y.o. male presenting for Medication Management     HPI  # Left hip prosthetic infection  - taking keflex and seeing infectious disease  # Hip/back pian - s/p hip replacement - Dr. Erlinda Hong - has been doing Tramadol - taking up to 3 per day - does not feel like tramadol is very effective for pain management  - also taking flexeril for muscle spasms  - getting muscle spasms  - any time he does things with more activity he feels run down - is active at home  Review of Systems   Social History   Tobacco Use  Smoking Status Former Smoker  . Packs/day: 1.00  . Years: 14.00  . Pack years: 14.00  . Types: Cigarettes  . Quit date: 07/27/1999  . Years since quitting: 21.3  Smokeless Tobacco Never Used        Objective:    BP Readings from Last 3 Encounters:  12/08/20 132/76  10/21/20 127/84  09/09/20 129/80   Wt Readings from Last 3 Encounters:  12/08/20 269 lb 4 oz (122.1 kg)  10/21/20 260 lb (117.9 kg)  09/09/20 253 lb (114.8 kg)    BP 132/76   Pulse (!) 9   Temp 98.4 F (36.9 C)   Resp 18   Wt 269 lb 4 oz (122.1 kg)   SpO2 99%   BMI 36.52 kg/m    Physical Exam Constitutional:      Appearance: Normal appearance. He is not ill-appearing or diaphoretic.  HENT:     Right Ear: External ear normal.     Left Ear: External ear normal.  Eyes:     General: No scleral icterus.    Extraocular Movements: Extraocular movements intact.     Conjunctiva/sclera: Conjunctivae normal.  Cardiovascular:     Rate and Rhythm: Normal rate and regular rhythm.  Pulmonary:     Effort: Pulmonary effort is normal. No respiratory distress.     Breath sounds: Normal breath sounds. No wheezing.  Musculoskeletal:     Cervical back: Neck supple.     Comments: Back Pain and localized soft tissue swelling near prior surgical incision  Skin:    General: Skin is warm and dry.  Neurological:     Mental Status: He is alert. Mental status is at  baseline.  Psychiatric:        Mood and Affect: Mood normal.     The 10-year ASCVD risk score Mikey Bussing DC Jr., et al., 2013) is: 6.2%   Values used to calculate the score:     Age: 78 years     Sex: Male     Is Non-Hispanic African American: No     Diabetic: No     Tobacco smoker: No     Systolic Blood Pressure: 161 mmHg     Is BP treated: No     HDL Cholesterol: 29.6 mg/dL     Total Cholesterol: 140 mg/dL       Assessment & Plan:   Problem List Items Addressed This Visit      Musculoskeletal and Integument   Left hip prosthetic joint infection ([place]) - Primary    Following with [name] with ID with downtrending inflammatory markers. Cont keflex and appreciate ID support.       Relevant Orders   Ambulatory referral to Pain Clinic     Other   S/P total hip arthroplasty    Pt has had 3 hip  replacements on the left side and c/b prostectic infection. Follows with Dr. Erlinda Hong and still has persistent pain and limited function. Reports he went to PT but eventually was not helping. Unable to work 2/2 to pain which is worse with mobility and job is highly active. Pain medicine referral for additional support.       Relevant Orders   Ambulatory referral to Pain Clinic   Anemia    Last check 05/2020 when hospitalized. Will f/u. If still low will need to consider treatment and w/u.       Relevant Orders   CBC   Obesity (BMI 35.0-39.9 without comorbidity)   Relevant Orders   Comprehensive metabolic panel   S/P lumbar laminectomy   Relevant Orders   Ambulatory referral to Pain Clinic   Chronic bilateral low back pain without sciatica    Persistent back pain. Initially was on oxycodone post op from laminectomy but ended up back in the hospital with right hip pain. He will call to verify which muscle relaxant and NSAID he is taking and consider dose adjustment. Ortho has been prescribing tramadol and taking 50 mg TID (was on oxy and agree with avoiding this as no longer in the acute  phase of pain). However, pain is persisting and discussed referral to pain medicine to see if a different treatment plan or any interventions would be helpful. Lidocaine patches as new treatment (ointment if patch $$$). Appreciate pain support.       Relevant Medications   lidocaine (LIDODERM) 5 %   Low HDL (under 40)    ASCVD 6.2% currently. Will recheck for screening.       Relevant Orders   Lipid panel       Return if symptoms worsen or fail to improve.  Lesleigh Noe, MD  This visit occurred during the SARS-CoV-2 public health emergency.  Safety protocols were in place, including screening questions prior to the visit, additional usage of staff PPE, and extensive cleaning of exam room while observing appropriate contact time as indicated for disinfecting solutions.

## 2020-12-08 NOTE — Patient Instructions (Addendum)
Robaxin and Flexeril -- muscle spasm   Meloxicam and Diclofenac -- Anti-inflammatory medication  Check your bottles at home and update with what you are taking  Lidocaine - patches if covered OR ointment if not covered

## 2020-12-08 NOTE — Assessment & Plan Note (Signed)
Pt has had 3 hip replacements on the left side and c/b prostectic infection. Follows with Dr. Erlinda Hong and still has persistent pain and limited function. Reports he went to PT but eventually was not helping. Unable to work 2/2 to pain which is worse with mobility and job is highly active. Pain medicine referral for additional support.

## 2020-12-08 NOTE — Assessment & Plan Note (Signed)
Following with [name] with ID with downtrending inflammatory markers. Cont keflex and appreciate ID support.

## 2020-12-08 NOTE — Assessment & Plan Note (Signed)
Last check 05/2020 when hospitalized. Will f/u. If still low will need to consider treatment and w/u.

## 2020-12-08 NOTE — Assessment & Plan Note (Signed)
Persistent back pain. Initially was on oxycodone post op from laminectomy but ended up back in the hospital with right hip pain. He will call to verify which muscle relaxant and NSAID he is taking and consider dose adjustment. Ortho has been prescribing tramadol and taking 50 mg TID (was on oxy and agree with avoiding this as no longer in the acute phase of pain). However, pain is persisting and discussed referral to pain medicine to see if a different treatment plan or any interventions would be helpful. Lidocaine patches as new treatment (ointment if patch $$$). Appreciate pain support.

## 2020-12-09 LAB — CBC
HCT: 40.5 % (ref 39.0–52.0)
Hemoglobin: 13.8 g/dL (ref 13.0–17.0)
MCHC: 33.9 g/dL (ref 30.0–36.0)
MCV: 96.2 fl (ref 78.0–100.0)
Platelets: 255 10*3/uL (ref 150.0–400.0)
RBC: 4.22 Mil/uL (ref 4.22–5.81)
RDW: 13.5 % (ref 11.5–15.5)
WBC: 7.4 10*3/uL (ref 4.0–10.5)

## 2020-12-09 LAB — LIPID PANEL
Cholesterol: 167 mg/dL (ref 0–200)
HDL: 52.4 mg/dL (ref >39.00)
LDL Cholesterol: 86 mg/dL (ref 0–99)
NonHDL: 114.7
Total CHOL/HDL Ratio: 3
Triglycerides: 146 mg/dL (ref 0.0–149.0)
VLDL: 29.2 mg/dL (ref 0.0–40.0)

## 2020-12-09 LAB — COMPREHENSIVE METABOLIC PANEL
ALT: 13 U/L (ref 0–53)
AST: 13 U/L (ref 0–37)
Albumin: 4 g/dL (ref 3.5–5.2)
Alkaline Phosphatase: 131 U/L — ABNORMAL HIGH (ref 39–117)
BUN: 11 mg/dL (ref 6–23)
CO2: 27 mEq/L (ref 19–32)
Calcium: 8.8 mg/dL (ref 8.4–10.5)
Chloride: 104 mEq/L (ref 96–112)
Creatinine, Ser: 1.08 mg/dL (ref 0.40–1.50)
GFR: 77.25 mL/min (ref >60.00)
Glucose, Bld: 105 mg/dL — ABNORMAL HIGH (ref 70–99)
Potassium: 4.7 mEq/L (ref 3.5–5.1)
Sodium: 137 mEq/L (ref 135–145)
Total Bilirubin: 0.3 mg/dL (ref 0.2–1.2)
Total Protein: 6.8 g/dL (ref 6.0–8.3)

## 2020-12-16 ENCOUNTER — Other Ambulatory Visit (HOSPITAL_COMMUNITY): Payer: Self-pay

## 2020-12-16 ENCOUNTER — Other Ambulatory Visit: Payer: Self-pay

## 2020-12-16 ENCOUNTER — Ambulatory Visit (INDEPENDENT_AMBULATORY_CARE_PROVIDER_SITE_OTHER): Payer: BC Managed Care – PPO | Admitting: Internal Medicine

## 2020-12-16 ENCOUNTER — Encounter: Payer: Self-pay | Admitting: Internal Medicine

## 2020-12-16 DIAGNOSIS — M545 Low back pain, unspecified: Secondary | ICD-10-CM | POA: Diagnosis not present

## 2020-12-16 NOTE — Assessment & Plan Note (Signed)
I am not sure what is causing his acute lower back pain but I am certainly worried about the possibility of relapse to vertebral infection even though he remains on cephalexin and rifampin.  Our pharmacy staff has started a prior approval for lidocaine patches to see if we can help stabilize his pain.  I have ordered a lumbar MRI.  He will have a phone follow-up with me in 1 week.

## 2020-12-16 NOTE — Progress Notes (Signed)
[facility]  Patient Active Problem List   Diagnosis Date Noted  . Acute low back pain 12/16/2020    Priority: High  . Left hip prosthetic joint infection ([place]) 04/11/2020    Priority: High  . MSSA bacteremia 04/11/2020    Priority: High  . Chronic bilateral low back pain without sciatica 04/11/2020    Priority: High  . Low HDL (under 40) 12/08/2020  . Hip dislocation, left ([place])   . 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 Adjustment disorder with depressed mood 11/19/2019  . Left hip pain 10/09/2019  . Blurry vision, left eye 10/09/2019  . Hyponatremia 08/20/2019  . S/P lumbar laminectomy 07/25/2019  . Anemia 07/18/2019  . Obesity (BMI 35.0-39.9 without comorbidity) 07/18/2019  . Constipation 07/18/2019  . Elevated blood sugar 04/01/2017  . Alcohol abuse, in remission 10/12/2016  . S/P total hip arthroplasty 10/12/2016    Patient's Medications  New Prescriptions   No medications on file  Previous Medications   ASPIRIN EC 81 MG TABLET    Take 1 tablet (81 mg total) by mouth 2 (two) times daily as needed.   CA CARBONATE-MAG HYDROXIDE (ROLAIDS PO)    Take 1 tablet by mouth daily as needed (heartburn).   CEPHALEXIN (KEFLEX) 500 MG CAPSULE    Take 1 capsule (500 mg total) by mouth 2 (two) times daily.   CYCLOBENZAPRINE (FLEXERIL) 10 MG TABLET    Take 1 tablet (10 mg total) by mouth 3 (three) times daily as needed.   DICLOFENAC (VOLTAREN) 75 MG EC TABLET    Take 1 tablet (75 mg total) by mouth 2 (two) times daily as needed.   LIDOCAINE (LIDODERM) 5 %    Place 1 patch onto the skin daily. Remove & Discard patch within 12 hours or as directed by MD   LIDOCAINE (XYLOCAINE) 5 % OINTMENT    Apply 1 application topically as needed.   MELOXICAM (MOBIC) 7.5 MG TABLET    1 tab po daily prn pain   METHOCARBAMOL (ROBAXIN) 750 MG TABLET    Take 1 tablet (750 mg total) by mouth 2 (two) times daily as needed for  muscle spasms.   RIFAMPIN (RIFADIN) 300 MG CAPSULE    Take by mouth.   TRAMADOL (ULTRAM) 50 MG TABLET    TAKE 1-2 TABLETS (50-100 MG TOTAL) BY MOUTH DAILY AS NEEDED.  Modified Medications   No medications on file  Discontinued Medications   No medications on file    Subjective: [name] is in for his routine follow-up visit.  He had MSSA bacteremia and lumbar infection in December of 2020.  He has a prosthetic left hip.  Hip aspirate Gram stain and cultures were negative.  He underwent lumbar decompression and laminectomy followed by 54 days of IV antibiotic therapy completing treatment on 09/17/2019. His infection appeared to have been cured.He did well and was slowly improving until he woke up on the morning of 04/05/2020 with severe left hip pain. He underwent MRI scan which showed a left acetabular osteomyelitis, adjacent myositis and a small iliac is abscess. Repeat blood cultures grew MSSA again.   He underwent incision and drainage of his left hip.  PICC was placed and he was discharged on IV cefazolin and oral rifampin.  He completed 6 weeks of IV therapy in October before switching to oral cephalexin and rifampin.    He dislocated his left hip requiring repeat  surgery in late November.  He underwent incision and drainage with repeat polyexchange.  The operative note indicated there was a seroma but otherwise no evidence of infection.  No specimens were submitted for stain or culture.   He recently developed acute left lower back pain again.  He did not have any obvious injury prior to the pain getting worse.  His back pain is now usually 7 out of 10.  His PCP prescribed a lidocaine patch recently but when he went to the pharmacy he was told that his insurance would not pay for it and that it was very expensive.  He has not had any change in his chronic left hip pain that measures about 4 out of 10.  Review of Systems: Review of Systems  Constitutional: Negative for chills, diaphoresis and  fever.  Respiratory: Negative for cough and shortness of breath.   Cardiovascular: Negative for chest pain.  Gastrointestinal: Negative for abdominal pain, diarrhea, nausea and vomiting.  Genitourinary: Negative for dysuria.  Musculoskeletal: Positive for back pain and joint pain.  Neurological: Negative for sensory change and focal weakness.    Past Medical History:  Diagnosis Date  . Cauda equina syndrome ([place]) 07/31/2019  . Cerebral septic emboli ([place]) 07/30/2019  . H/O MSSA epidural abscess, L2-L5 07/25/2019  . History of chicken pox   . Medical history non-contributory     Social History   Tobacco Use  . Smoking status: Former Smoker    Packs/day: 1.00    Years: 14.00    Pack years: 14.00    Types: Cigarettes    Quit date: 07/27/1999    Years since quitting: 21.4  . Smokeless tobacco: Never Used  Vaping Use  . Vaping Use: Never used  Substance Use Topics  . Alcohol use: Not Currently    Alcohol/week: 24.0 standard drinks    Types: 24 Cans of beer per week    Comment: 4-5 beers per day, case of beer per week  . Drug use: No    Family History  Problem Relation Age of Onset  . Cancer Father        Hodgkin's disease  . COPD Mother   . Heart attack Maternal Grandfather 80  . Diabetes Neg Hx   . Stroke Neg Hx   . Hypertension Neg Hx   . Hyperlipidemia Neg Hx     Allergies  Allergen Reactions  . Bee Venom Anaphylaxis  . Hydrocodone Nausea And Vomiting    (12/10/19 - pt says he has taken without issues)    Objective: Vitals:   12/16/20 1529  BP: (!) 147/86  Pulse: 85  Temp: 97.7 F (36.5 C)  TempSrc: Oral  SpO2: 99%  Weight: 269 lb (122 kg)  Height: 6' (1.829 m)   Body mass index is 36.48 kg/m.  Physical Exam Constitutional:      Comments: He is in obvious discomfort due to his pain.  Cardiovascular:     Rate and Rhythm: Normal rate and regular rhythm.     Heart sounds: No murmur heard.   Pulmonary:     Effort: Pulmonary effort is normal.      Breath sounds: Normal breath sounds.  Abdominal:     Palpations: Abdomen is soft.     Tenderness: There is no abdominal tenderness.  Musculoskeletal:     Comments: His lumbar incision has healed nicely without obvious infection.     Lab Results Sed Rate  Date Value  10/21/2020 14 mm/h  07/29/2020 11 mm/h  04/09/2020 78 mm/hr (H)   CRP  Date Value  10/21/2020 11.1 mg/L (H)  07/29/2020 3.6 mg/L  04/10/2020 20.5 mg/dL (H)     Problem List Items Addressed This Visit      High   Acute low back pain    I am not sure what is causing his acute lower back pain but I am certainly worried about the possibility of relapse to vertebral infection even though he remains on cephalexin and rifampin.  Our pharmacy staff has started a prior approval for lidocaine patches to see if we can help stabilize his pain.  I have ordered a lumbar MRI.  He will have a phone follow-up with me in 1 week.      Relevant Orders   MR Lumbar Spine W Wo Contrast       Michel Bickers, MD [facility] [place] [phone] pager   [phone] cell 12/16/2020, 5:14 PM

## 2020-12-18 ENCOUNTER — Other Ambulatory Visit (HOSPITAL_COMMUNITY): Payer: Self-pay

## 2020-12-19 ENCOUNTER — Telehealth: Payer: Self-pay

## 2020-12-19 NOTE — Telephone Encounter (Signed)
[facility] Patient Advocate Encounter  Received notification from [employer]/[place] that the request for prior authorization for Lidocaine Patches has been denied.  The medication is approved for treatment of lasting nerve and skin pain caused by shingles (postherpetic, neuralgia).       Ileene Patrick, [facility] Patient [facility] Phone: [phone] Fax:  [phone]

## 2020-12-23 ENCOUNTER — Encounter: Payer: Self-pay | Admitting: Physical Medicine and Rehabilitation

## 2020-12-24 ENCOUNTER — Ambulatory Visit (HOSPITAL_COMMUNITY)
Admission: RE | Admit: 2020-12-24 | Discharge: 2020-12-24 | Disposition: A | Discharge status: Home / Self Care | Payer: BC Managed Care – PPO | Source: Ambulatory Visit | Attending: Internal Medicine | Admitting: Internal Medicine

## 2020-12-24 ENCOUNTER — Other Ambulatory Visit: Payer: Self-pay

## 2020-12-24 DIAGNOSIS — M545 Low back pain, unspecified: Secondary | ICD-10-CM | POA: Insufficient documentation

## 2020-12-24 IMAGING — MR MR LUMBAR SPINE WO/W CM
8 of 11 series · 29 of 48 positions shown · IV contrast (multihance)
Comparison: [DATE]

CLINICAL DATA: Low back pain with infection suspected. History of
spinal infection with worsening back pain

EXAM:
MRI LUMBAR SPINE WITHOUT AND WITH CONTRAST
TECHNIQUE: Multiplanar and multiecho pulse sequences of the lumbar spine were
obtained without and with intravenous contrast.
CONTRAST:  10mL MULTIHANCE GADOBENATE DIMEGLUMINE 529 MG/ML IV SOLN

[Series 5: T1 · sagittal · 4.0mm · 0.86mm/px · 2 of 17 slices shown (1 of 2)]
[im 1/17]
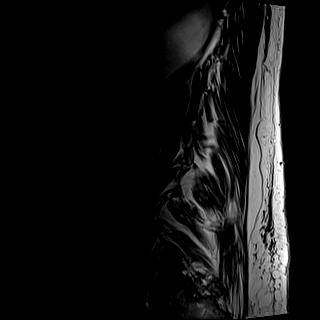
[im 17/17]
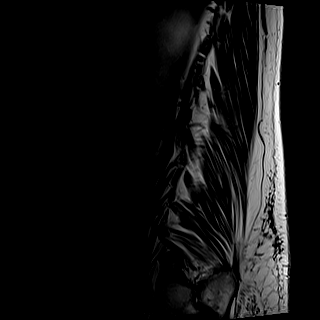

[Series 6: STIR · sagittal · 4.0mm · 0.51mm/px · 2 of 17 slices shown]
[im 1/17]
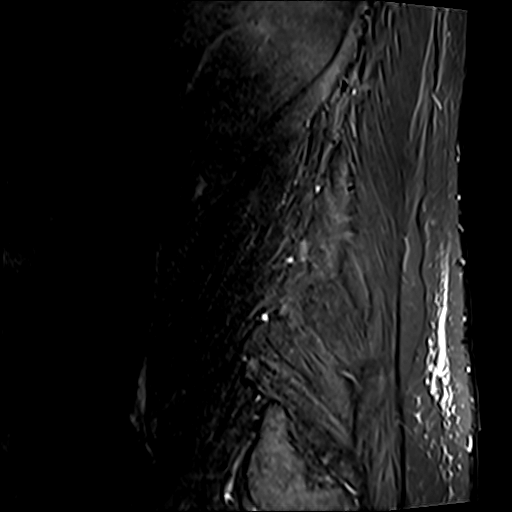
[im 17/17]
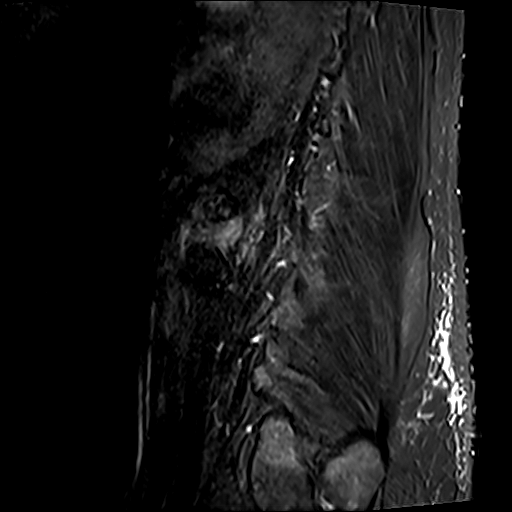

[Series 7: T2 · axial · 4.0mm · 0.62mm/px · z∈[-77,+170]mm · 6 of 48 slices shown (1 of 2)]
[im 1/48]
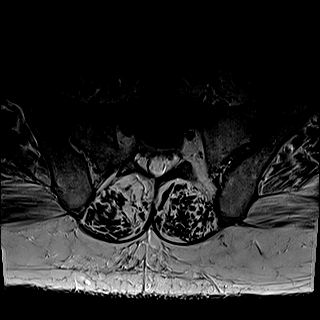
[im 10/48]
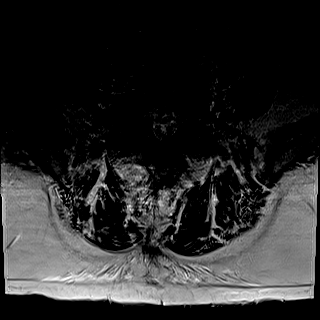
[im 19/48]
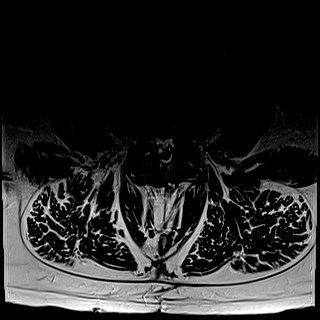
[im 29/48]
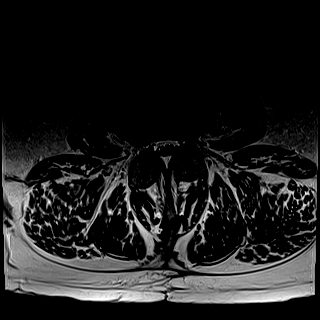
[im 38/48]
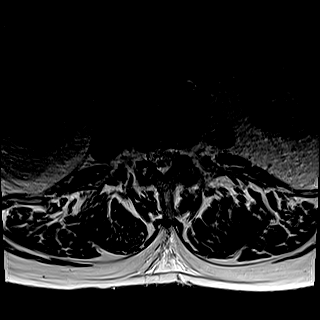
[im 48/48]
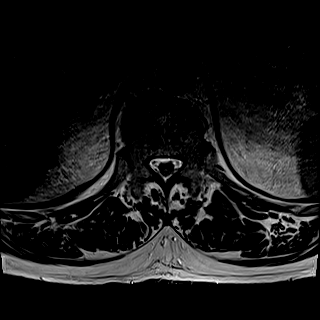

[Series 8: T1 · axial · 4.0mm · 0.39mm/px · z∈[-77,+170]mm · 6 of 48 slices shown (2 of 2)]
[im 1/48]
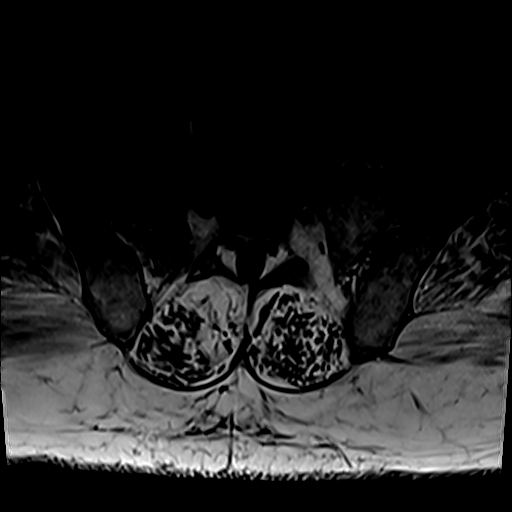
[im 10/48]
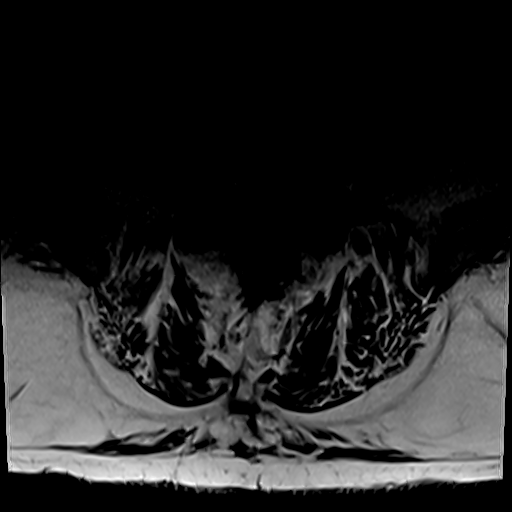
[im 19/48]
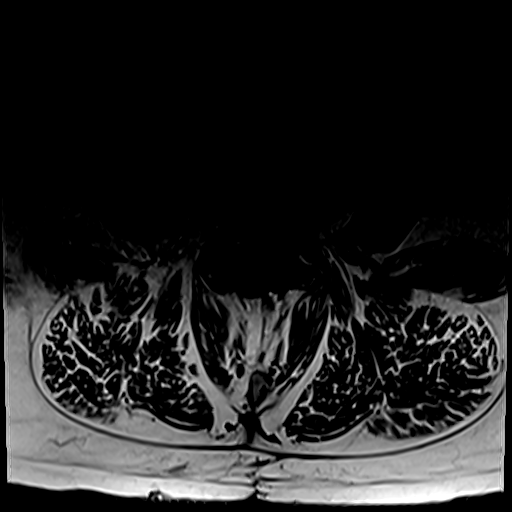
[im 29/48]
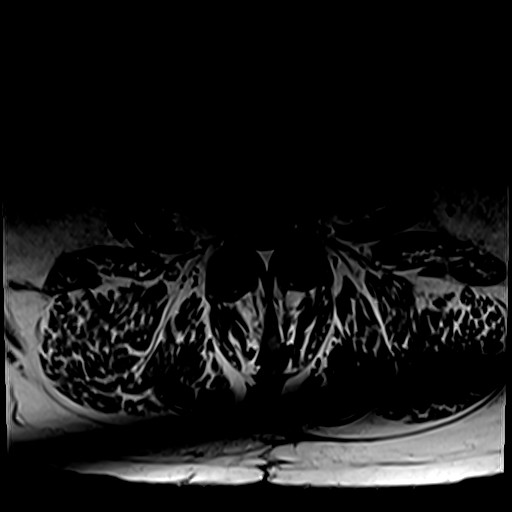
[im 38/48]
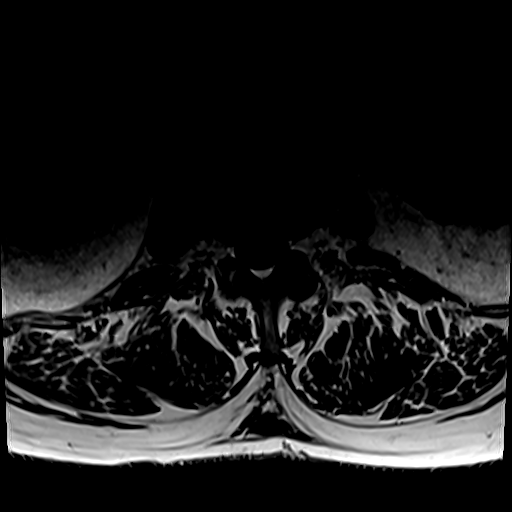
[im 48/48]
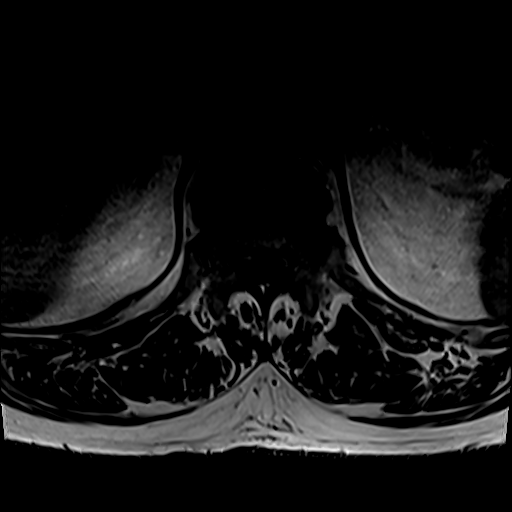

[Series 9: T2 post-contrast · sagittal · 4.0mm · 0.86mm/px · 2 of 17 slices shown]
[im 1/17]
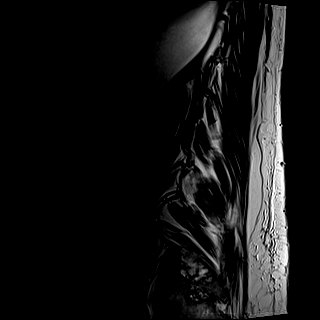
[im 17/17]
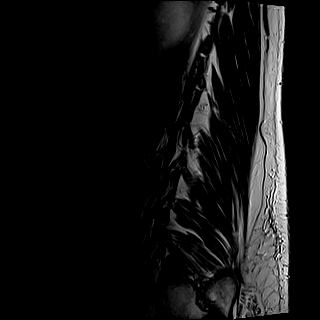

[Series 10: T1 fat-sat post-contrast · sagittal · 4.0mm · 0.86mm/px · 2 of 17 slices shown]
[im 1/17]
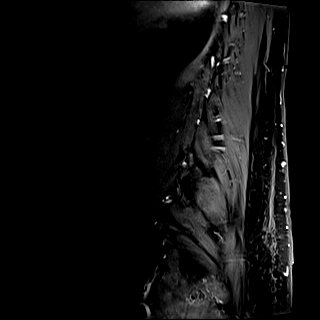
[im 17/17]
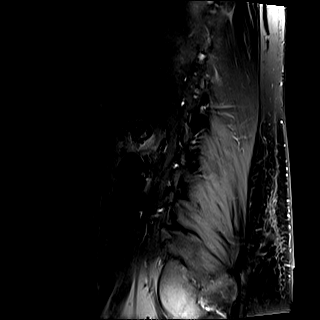

[Series 11: T1 post-contrast · axial · 4.0mm · 0.43mm/px · z∈[-80,+9]mm · 3 of 48 slices shown]
[im 1/48]
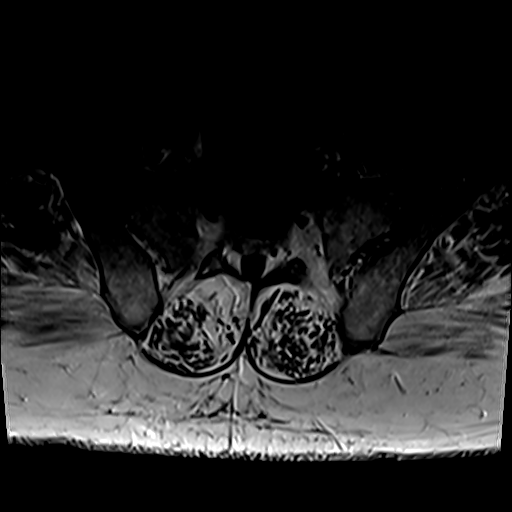
[im 10/48]
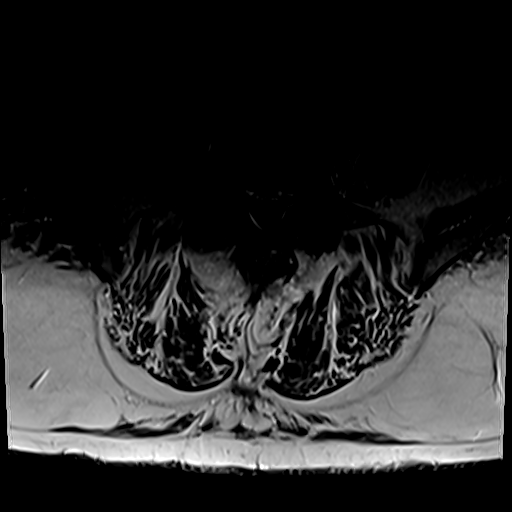
[im 19/48]
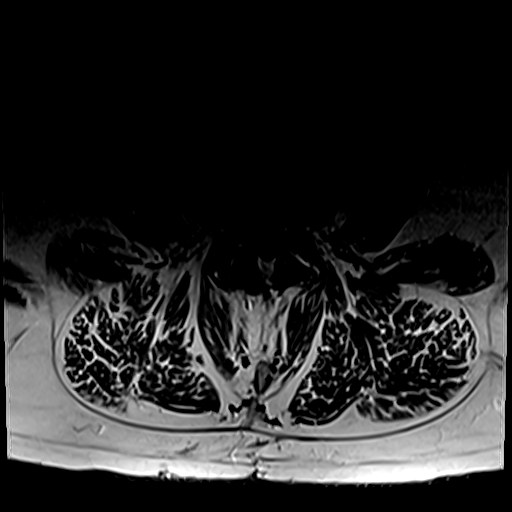

[Series 12: T2 · axial · 4.0mm · 0.62mm/px · z∈[-77,+170]mm · 6 of 48 slices shown (2 of 2)]
[im 1/48]
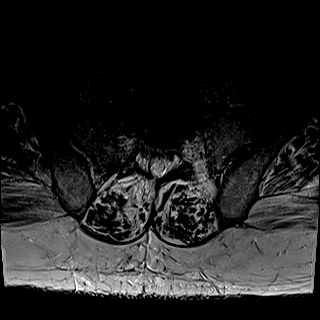
[im 10/48]
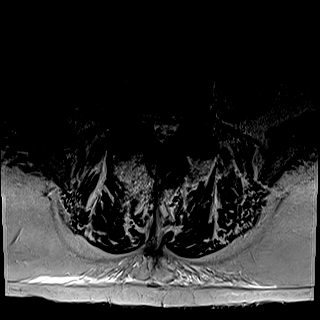
[im 19/48]
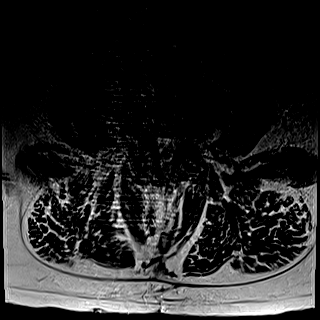
[im 29/48]
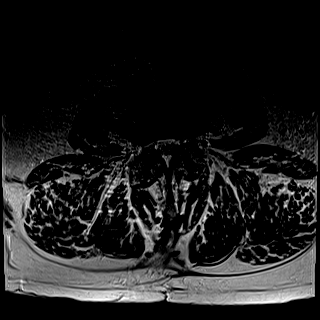
[im 38/48]
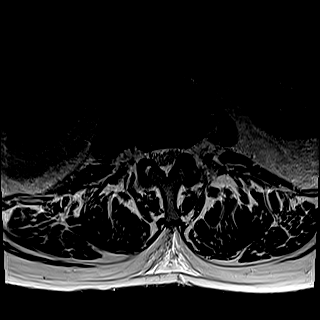
[im 48/48]
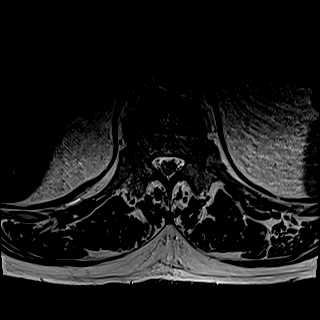

[29 of 48 positions shown; findings below may reference images not displayed]

FINDINGS: Segmentation:  5 lumbar type vertebrae

Alignment:  Slight retrolisthesis at L4-5 and L3-4.

Vertebrae: Marrow edema and paravertebral edema about a bulky left
far-lateral osteophyte at L2-3. Resolved marrow edema at L3-4. No
fracture. No aggressive bone lesion

Conus medullaris and cauda equina: Conus extends to the L1 level.
Conus and cauda equina appear normal.

Paraspinal and other soft tissues: Paravertebral edema around the
inflamed left-sided osteophyte at L2-3. Negative for abscess.

Disc levels:

T12- L1: Unremarkable.

L1-L2: Disc narrowing and mild ventral spurring.

L2-L3: Disc narrowing with bulky left far-lateral endplate spurring
and above described superimposed inflammation. Mild disc bulging.

L3-L4: Disc narrowing and bulging. There is likely intervertebral
ankylosis due to osteophyte. Posterior decompression. No visible
neural impingement

L4-L5: Disc narrowing and bulging with facet spurring. There has
been posterior decompression with no suspected neural impingement

L5-S1:Degenerative facet spurring.
IMPRESSION: 1. Focal inflammatory changes at a bulky L2-3 left far-lateral
osteophyte which could be degenerative or infectious. Given the
prior findings, infection seems more likely. No abscess.
2. Lumbar spine degeneration without high-grade stenosis.

## 2020-12-24 MED ORDER — GADOBENATE DIMEGLUMINE 529 MG/ML IV SOLN
10.0000 mL | Freq: Once | INTRAVENOUS | Status: AC | PRN
  Administered 2020-12-24: 10 mL via INTRAVENOUS

## 2020-12-25 ENCOUNTER — Other Ambulatory Visit: Payer: Self-pay

## 2020-12-25 ENCOUNTER — Ambulatory Visit: Payer: BC Managed Care – PPO | Admitting: Internal Medicine

## 2020-12-25 NOTE — Telephone Encounter (Signed)
Attempted to call patient 3 times for today's virtual appointment, no answer. Sent MyChart message to follow up with patient.   Beryle Flock, RN

## 2020-12-29 ENCOUNTER — Other Ambulatory Visit: Payer: Self-pay | Admitting: Family Medicine

## 2020-12-29 ENCOUNTER — Other Ambulatory Visit: Payer: Self-pay | Admitting: Orthopaedic Surgery

## 2020-12-30 NOTE — Telephone Encounter (Signed)
Please call pt to verify which muscle relaxant he is taking.   He has both Robaxin and flexeril. Ideally would just be on one.

## 2020-12-31 ENCOUNTER — Other Ambulatory Visit: Payer: Self-pay

## 2020-12-31 ENCOUNTER — Ambulatory Visit (INDEPENDENT_AMBULATORY_CARE_PROVIDER_SITE_OTHER): Payer: BC Managed Care – PPO | Admitting: Internal Medicine

## 2020-12-31 ENCOUNTER — Telehealth: Payer: Self-pay

## 2020-12-31 DIAGNOSIS — M545 Low back pain, unspecified: Secondary | ICD-10-CM

## 2020-12-31 NOTE — Telephone Encounter (Signed)
Has he been using/taking Tramadol?   This was last filled 10/2020. He may have refills at the pharmacy

## 2020-12-31 NOTE — Progress Notes (Signed)
Virtual Visit via Telephone Note  I connected with [name] on 12/31/20 at 10:15 AM EDT by telephone and verified that I am speaking with the correct person using two identifiers.  Location: Patient: Home Provider: [facility]   I discussed the limitations, risks, security and privacy concerns of performing an evaluation and management service by telephone and the availability of in person appointments. I also discussed with the patient that there may be a patient responsible charge related to this service. The patient expressed understanding and agreed to proceed.   History of Present Illness: I called and spoke with [name] today. He had MSSA bacteremia and lumbar infection in December of 2020. He has a prosthetic left hip. Hip aspirate Gram stain and cultures were negative. He underwent lumbar decompression and laminectomy followed by 54 days of IV antibiotic therapy completing treatment on 09/17/2019. His infection appeared to have been cured.He did well and was slowly improving until he woke up on the morning of 04/05/2020 with severe left hip pain. He underwent MRI scan which showed a left acetabular osteomyelitis, adjacent myositis and a small iliac is abscess. Repeat blood cultures grew MSSA again.   He underwent incision and drainage of his left hip. PICC was placed and he was discharged on IV cefazolin and oral rifampin. He completed 6 weeks of IV therapy in October before switching to oral cephalexin and rifampin.   He dislocated his left hip requiring repeat surgery in late November. He underwent incision and drainage with repeat polyexchange. The operative note indicated there was a seroma but otherwise no evidence of infection. No specimens were submitted for stain or culture.   He recently developed acute left lower back pain again.  He did not have any obvious injury prior to the pain getting worse.  His back pain is now usually 7 out of 10.  His PCP prescribed a lidocaine  patch recently but when he went to the pharmacy he was told that his insurance would not pay for it and that it was very expensive.   He says the pain remains unchanged and excruciating at times.  He has difficulty walking.  He has not had any change in his chronic left hip pain that measures about 4 out of 10.   Observations/Objective: Lumbar MRI 12/24/2020 IMPRESSION: 1. Focal inflammatory changes at a bulky L2-3 left far-lateral osteophyte which could be degenerative or infectious. Given the prior findings, infection seems more likely. No abscess. 2. Lumbar spine degeneration without high-grade stenosis.  Sed Rate  Date Value  10/21/2020 14 mm/h  07/29/2020 11 mm/h  04/09/2020 78 mm/hr (H)   CRP  Date Value  10/21/2020 11.1 mg/L (H)  07/29/2020 3.6 mg/L  04/10/2020 20.5 mg/dL (H)    Assessment and Plan: It would be unusual for her MSSA infection to flare on his current 2 antibiotic regimen of cephalexin and rifampin.  I will make a referral back to his neurosurgeon, Dr. Dominica Severin cram to get his thoughts about the MRI findings.  Follow Up Instructions: Neurosurgery referral Continue cephalexin and rifampin for now Follow-up in 1 month   I discussed the assessment and treatment plan with the patient. The patient was provided an opportunity to ask questions and all were answered. The patient agreed with the plan and demonstrated an understanding of the instructions.   The patient was advised to call back or seek an in-person evaluation if the symptoms worsen or if the condition fails to improve as anticipated.  I provided 17  minutes of non-face-to-face time during this encounter.   Michel Bickers, MD

## 2020-12-31 NOTE — Telephone Encounter (Signed)
Pt said he just got call from wound care physician office and pt had MRI  Which pt was notified has spur and infection in bottom of lt lower back. Wound care office is doing referral to Dr Saintclair Halsted neurosurgeon who pt saw in 2021. Pt could not get the lidoderm 5% patch because ins would not cover and cost to pt would be between $600 - $700 and that was too expensive for pt. Advised by wound care to contact PCP for pain med until can see neurosurgery. Pt has tried OTC Lidocaine 4% patch which did not help the lower lt back pain at all. Pt request a pain med to [facility]. pts pain level now is 5-6 and when pt gets up and moves around it increases the pain also. Pt request cb ASAP.

## 2020-12-31 NOTE — Addendum Note (Signed)
Addended by: Tyrone Apple on: 12/31/2020 10:33 AM   Modules accepted: Orders

## 2021-01-01 NOTE — Telephone Encounter (Signed)
Pt states that he takes both medications, but the flexeril he takes when he takes the tramadol, which is more often. He only takes the robaxin when he feels like "someone is poking me but no one is there". He has 18 flexeril left and 11.5 robaxin left.    Refill of flexeril provided by Dr. Einar Pheasant.

## 2021-01-08 ENCOUNTER — Encounter
Payer: BC Managed Care – PPO | Attending: Physical Medicine and Rehabilitation | Admitting: Physical Medicine and Rehabilitation

## 2021-01-08 ENCOUNTER — Other Ambulatory Visit: Payer: Self-pay

## 2021-01-08 ENCOUNTER — Encounter: Payer: Self-pay | Admitting: Physical Medicine and Rehabilitation

## 2021-01-08 VITALS — BP 135/85 | HR 69 | Temp 98.5°F | Ht 72.0 in | Wt 264.0 lb

## 2021-01-08 DIAGNOSIS — G894 Chronic pain syndrome: Secondary | ICD-10-CM

## 2021-01-08 DIAGNOSIS — G8929 Other chronic pain: Secondary | ICD-10-CM | POA: Diagnosis not present

## 2021-01-08 DIAGNOSIS — M545 Low back pain, unspecified: Secondary | ICD-10-CM | POA: Diagnosis not present

## 2021-01-08 MED ORDER — DICLOFENAC SODIUM 1 % EX GEL: 2.0000 g | Freq: Four times a day (QID) | CUTANEOUS | 3 refills | Status: DC

## 2021-01-08 MED ORDER — LIDOCAINE 5 % EX PTCH: 1.0000 | MEDICATED_PATCH | CUTANEOUS | 0 refills | Status: DC

## 2021-01-08 NOTE — Patient Instructions (Signed)
1) Ginger (especially studied for arthritis)- reduce leukotriene production to decrease inflammation 2) Blueberries- high in phytonutrients that decrease inflammation 3) Salmon- marine omega-3s reduce joint swelling and pain 4) Pumpkin seeds- reduce inflammation 5) dark chocolate- reduces inflammation 6) turmeric- reduces inflammation 7) tart cherries - reduce pain and stiffness 8) extra virgin olive oil - its compound olecanthal helps to block prostaglandins  9) chili peppers- can be eaten or applied topically via capsaicin 10) mint- helpful for headache, muscle aches, joint pain, and itching 11) garlic- reduces inflammation  Link to further information on diet for chronic pain: [email or web address]  

## 2021-01-08 NOTE — Progress Notes (Signed)
Subjective:    Patient ID: [name], male    DOB: 06/09/1966, 55 y.o.   MRN: [id number]  HPI Mr. Carchi is a 55 year old man who presents with left sided low back pain. He had an MRI lumbar by infectious disease that shows an L2-L3 infectious process. The pain is present all day and all night. The more he does the more he hurts. He knows he needs to follow-up with neurosurgery but needed to get the money for his appointment. Average pain is 4/10. Currently taking a cocktail of tramadol, meloxicam, flexeril, and applying a cold rub- all of which help but minimally. 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   Pain Inventory Average Pain 4 Pain Right Now 4 My pain is  not sure  In the last 24 hours, has pain interfered with the following? General activity 4 Relation with others 7 Enjoyment of life 4 What TIME of day is your pain at its worst? morning , daytime, evening, and night Sleep (in general) Poor  Pain is worse with: walking, bending, and some activites Pain improves with: medication Relief from Meds: 6  walk without assistance ability to climb steps?  yes do you drive?  yes Do you have any goals in this area?  yes  employed # of hrs/week 40  tingling trouble walking spasms  CT/MRI  N/a    Family History  Problem Relation Age of Onset   Cancer Father        Hodgkin's disease   COPD Mother    Heart attack Maternal Grandfather 80   Diabetes Neg Hx    Stroke Neg Hx    Hypertension Neg Hx    Hyperlipidemia Neg Hx    Social History   Socioeconomic History   Marital status: Married    Spouse name: Manuela Schwartz   Number of children: 2   Years of education: High school   Highest education level: Not on file  Occupational History   Occupation: burial Occupational hygienist: [employer]    Comment: [employer]  Tobacco Use   Smoking status: Former    Packs/day: 1.00    Years: 14.00    Pack years: 14.00    Types: Cigarettes    Quit date: 07/27/1999     Years since quitting: 21.4   Smokeless tobacco: Never  Vaping Use   Vaping Use: Never used  Substance and Sexual Activity   Alcohol use: Not Currently    Alcohol/week: 24.0 standard drinks    Types: 24 Cans of beer per week    Comment: 4-5 beers per day, case of beer per week   Drug use: No   Sexual activity: Yes    Birth control/protection: Post-menopausal  Other Topics Concern   Not on file  Social History Narrative   02/05/19   From: the area   Living: Living with wife Manuela Schwartz   Work: Dealer      Family: 2 children - [name] and [name] - liver nearby, no grandkids      Enjoys: float down the river, race track, camping      Exercise: just keeping busy at work   Diet: mostly meat and potatoes      Safety   Seat belts: Yes    Guns: Yes  and secure   Safe in relationships: Yes    Social Determinants of Radio broadcast assistant Strain: Not on file  Food Insecurity: Not on file  Transportation Needs: Not on file  Physical Activity: Not on file  Stress: Not on file  Social Connections: Not on file   Past Surgical History:  Procedure Laterality Date   CARPAL TUNNEL RELEASE Bilateral 2009   CATARACT EXTRACTION W/PHACO Left 12/17/2019   Procedure: CATARACT EXTRACTION PHACO AND INTRAOCULAR LENS PLACEMENT ([place]) LEFT;  Surgeon: Eulogio Bear, MD;  Location: [place];  Service: Ophthalmology;  Laterality: Left;  3.61 0:26.9   CATARACT EXTRACTION W/PHACO Right 03/17/2020   Procedure: CATARACT EXTRACTION PHACO AND INTRAOCULAR LENS PLACEMENT (IOC) RIGHT 4.41  00:28.1;  Surgeon: Eulogio Bear, MD;  Location: [place];  Service: Ophthalmology;  Laterality: Right;   COLONOSCOPY W/ POLYPECTOMY     IR FLUORO GUIDED NEEDLE PLC ASPIRATION/INJECTION LOC  04/10/2020   JOINT REPLACEMENT     LACERATION REPAIR Right ~ 2012   leg   LUMBAR LAMINECTOMY/DECOMPRESSION MICRODISCECTOMY N/A 07/24/2019   Procedure: Decompressive Lumbar Laminectomy Lumbar three-four  Lumbar four-five for Epidural Abscess;  Surgeon: Kary Kos, MD;  Location: [place];  Service: Neurosurgery;  Laterality: N/A;   TOTAL HIP ARTHROPLASTY Right 01/11/2013   Procedure: TOTAL HIP ARTHROPLASTY;  Surgeon: Garald Balding, MD;  Location: [place];  Service: Orthopedics;  Laterality: Right;   TOTAL HIP ARTHROPLASTY Left 10/12/2016   Procedure: TOTAL HIP ARTHROPLASTY;  Surgeon: Garald Balding, MD;  Location: [place];  Service: Orthopedics;  Laterality: Left;   TOTAL HIP ARTHROPLASTY Left 04/11/2020   Procedure: LEFT HIP IRRIGATION AND DEBRIDEMENT WITH HEAD AND POLY SWAP;  Surgeon: Leandrew Koyanagi, MD;  Location: [place];  Service: Orthopedics;  Laterality: Left;   TOTAL HIP REVISION Left 06/21/2020   Procedure: LEFT TOTAL HIP REVISION;  Surgeon: Leandrew Koyanagi, MD;  Location: [place];  Service: Orthopedics;  Laterality: Left;   Past Medical History:  Diagnosis Date   Cauda equina syndrome ([place]) 07/31/2019   Cerebral septic emboli ([place]) 07/30/2019   H/O MSSA epidural abscess, L2-L5 07/25/2019   History of chicken pox    Medical history non-contributory    BP 135/85 (BP Location: Right Arm, Patient Position: Sitting, Cuff Size: Large)   Pulse 69   Temp 98.5 F (36.9 C) (Oral)   Ht 6' (1.829 m)   Wt 264 lb (119.7 kg)   SpO2 98%   BMI 35.80 kg/m   Opioid Risk Score:   Fall Risk Score:  `1  Depression screen PHQ 2/9  Depression screen [facility] 2/9 12/31/2020 12/16/2020 10/21/2020 05/20/2020 02/05/2020 11/19/2019 11/14/2019  Decreased Interest 0 0 0 0 2 3 0  Down, Depressed, Hopeless 0 0 0 0 2 2 1   PHQ - 2 Score 0 0 0 0 4 5 1   Altered sleeping - - - - 3 3 -  Tired, decreased energy - - - - 3 0 -  Change in appetite - - - - 2 0 -  Feeling bad or failure about yourself  - - - - 3 0 -  Trouble concentrating - - - - 0 0 -  Moving slowly or fidgety/restless - - - - 0 3 -  Suicidal thoughts - - - - 0 0 -  PHQ-9 Score - - - - 15 11 -  Difficult doing work/chores - - - - Somewhat difficult Somewhat  difficult -      Review of Systems  Constitutional: Negative.   HENT: Negative.    Eyes: Negative.   Respiratory: Negative.    Cardiovascular: Negative.   Gastrointestinal: Negative.  Endocrine: Negative.   Genitourinary: Negative.   Musculoskeletal:  Positive for back pain and gait problem.       Spasms  Skin: Negative.   Allergic/Immunologic: Negative.   Hematological: Negative.   Psychiatric/Behavioral: Negative.        Objective:   Physical Exam Gen: no distress, normal appearing HEENT: oral mucosa pink and moist, NCAT Cardio: Reg rate Chest: normal effort, normal rate of breathing Abd: soft, non-distended Ext: no edema Psych: pleasant, normal affect Skin: intact Neuro: Alert and oriented x3 Musculoskeletal: Antalgic gait, disc bulge left L2-L3 palpable and tender with even light palpation.      Assessment & Plan:  Chronic Pain Syndrome secondary to L2-L3 osteophyte w/ infectious process, failed multiple surgeries.  -Discussed current symptoms of pain and history of pain.  -Discussed benefits of exercise in reducing pain. -Urine sample and pain contract signed today -If urine sample with expected metabolites, will prescribe Norco 5mg  TID PRN. He will f/u with [facility] monthly next 4 months, then me in 5 months. Discussed risks and benefits of this medication. 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  -Discussed following foods that may reduce pain: 1) Ginger (especially studied for arthritis)- reduce leukotriene production to decrease inflammation 2) Blueberries- high in phytonutrients that decrease inflammation 3) Salmon- marine omega-3s reduce joint swelling and pain 4) Pumpkin seeds- reduce inflammation 5) dark chocolate- reduces inflammation 6) turmeric- reduces inflammation 7) tart cherries - reduce pain and stiffness 8) extra virgin olive oil - its compound olecanthal helps to block prostaglandins  9) chili peppers- can be eaten or applied  topically via capsaicin 10) mint- helpful for headache, muscle aches, joint pain, and itching 11) garlic- reduces inflammation  Link to further information on diet for chronic pain: [email or web address]

## 2021-01-15 ENCOUNTER — Telehealth: Payer: Self-pay | Admitting: *Deleted

## 2021-01-15 ENCOUNTER — Telehealth: Payer: Self-pay

## 2021-01-15 LAB — TOXASSURE SELECT,+ANTIDEPR,UR

## 2021-01-15 NOTE — Telephone Encounter (Addendum)
-----   Message from Izora Ribas, MD sent at 01/15/2021  6:18 AM EDT ----- Urine sample contains alcohol- this was his first visit. Arleatha Philipps, can you please let him know we do not prescribe controlled medications with alcohol use, but if he would like to try the [place], he can stop drinking alcohol and we can repeat his urine sample again at his next appointment with Zella Ball.    Formal warning letter sent.

## 2021-01-15 NOTE — Telephone Encounter (Signed)
Urine drug screen is positive for unprescribed alprazolam and oxymorphone, as well as his prescribed tramdol. Per the PMP he was prescribed oxycodone acetaminophen 5/325 last in January (#30) so oxymorphone could be a metabolite of taking an old oxycodone 5/325, but there has not been a prescription for alprazolam in past 5 yr hx. Positive for alcohol as well.  Warning letter was sent for combining alcohol with narcotics, though it did not address the benzo present. Retest next visit per Dr Ranell Patrick.. (see previous message)

## 2021-01-15 NOTE — Telephone Encounter (Signed)
Vm from pt asking for a call back about the drug screening Dr. Einar Pheasant told him to get.    I spoke with pt about the vm.  States he got the results from the UDS in his MyChart and it shows he had alcohol in his system.  But he's concerned about the message stating not to consume alcohol while taking narcotics.  Pt states he's not taking any pain med.  I tried to explained that may be an automatic cautionary message do to the nature of the test.  However, pt still wants to talk with Dr. Einar Pheasant.  Explained Dr. Einar Pheasant is out of the office but his message will be forwarded to her.  Pt verbalizes understanding and expresses his thanks.

## 2021-01-16 ENCOUNTER — Telehealth: Payer: Self-pay | Admitting: Registered Nurse

## 2021-01-16 NOTE — Telephone Encounter (Signed)
Left Vm for pt letting him know that Dr. Einar Pheasant will return on July 5th and will address his concerns then.

## 2021-01-16 NOTE — Telephone Encounter (Signed)
I will defer this to Dr. Einar Pheasant upon her return. Please call patient and notify him that she will be back on July 5th. Have him send another My Chart message around then, or he may call back at that time.

## 2021-01-16 NOTE — Telephone Encounter (Signed)
Patient would like a call about his urine drug screen.  Wanted to know when I was illegal to drink alcohol.

## 2021-01-16 NOTE — Telephone Encounter (Signed)
Attempted to reach Mr Mezera, no answer. LVM to call our office back.

## 2021-01-20 ENCOUNTER — Encounter: Payer: Self-pay | Admitting: *Deleted

## 2021-01-20 NOTE — Telephone Encounter (Signed)
Have attemtped to reach [name] x 3 with no success.  I have sent him a mychart message about his alcohol comment, and informing him his drug screen contained unprescribed medication.

## 2021-01-27 NOTE — Telephone Encounter (Signed)
Please call patient back and advise that he should call the physical medicine and Rehab office.   They were the ones who did the UDS  and sent the messages he is referring to. It appears he has a follow-up with their office.

## 2021-01-28 NOTE — Telephone Encounter (Signed)
Left VM for pt letting him know that I sent a mychart message relaying Dr. Verda Cumins message.

## 2021-02-05 ENCOUNTER — Encounter: Payer: BC Managed Care – PPO | Admitting: Registered Nurse

## 2021-02-05 ENCOUNTER — Other Ambulatory Visit: Payer: Self-pay

## 2021-02-05 ENCOUNTER — Encounter
Payer: BC Managed Care – PPO | Attending: Physical Medicine and Rehabilitation | Admitting: Physical Medicine and Rehabilitation

## 2021-02-05 VITALS — BP 121/81 | HR 85 | Temp 98.8°F | Ht 72.0 in | Wt 262.4 lb

## 2021-02-05 DIAGNOSIS — R454 Irritability and anger: Secondary | ICD-10-CM | POA: Diagnosis not present

## 2021-02-05 DIAGNOSIS — Z9889 Other specified postprocedural states: Secondary | ICD-10-CM | POA: Diagnosis not present

## 2021-02-05 DIAGNOSIS — G894 Chronic pain syndrome: Secondary | ICD-10-CM | POA: Insufficient documentation

## 2021-02-05 MED ORDER — DULOXETINE HCL 20 MG PO CPEP: 20.0000 mg | ORAL_CAPSULE | Freq: Every day | ORAL | 2 refills | Status: DC

## 2021-02-05 NOTE — Progress Notes (Addendum)
Subjective:    Patient ID: [name], male    DOB: 11/10/1965, 55 y.o.   MRN: [id number]  HPI [name] is a 55 year old man who presents with left sided low back pain. He had an MRI lumbar by infectious disease that shows an L2-L3 infectious process. The pain is present all day and all night. The more he does the more he hurts. He knows he needs to follow-up with neurosurgery but needed to get the money for his appointment. Average pain is 4/10. Currently taking a cocktail of tramadol, meloxicam, flexeril, and applying a cold rub- all of which help but minimally. 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   He is willing to stop drinking and would like to take the hydrocodone three time per day. He tried over the counter Lidocaine 4% patch without much relief. He has tried BioFreeze and it helps while he uses it, does not help when he stops. He has never tried Cymbalta and is agreeable to trying. He notes he is short and irritated with people when he doesn't mean to be.   Pain Inventory Average Pain 5 Pain Right Now 6 My pain is sharp and stabbing  In the last 24 hours, has pain interfered with the following? General activity 2 Relation with others 4 Enjoyment of life 5 What TIME of day is your pain at its worst? morning , daytime, and night Sleep (in general) Poor  Pain is worse with: walking, bending, sitting, and standing Pain improves with: rest and medication Relief from Meds: 6  walk without assistance ability to climb steps?  yes do you drive?  yes Do you have any goals in this area?  yes  employed # of hrs/week 40  tingling trouble walking spasms  CT/MRI  N/a    Family History  Problem Relation Age of Onset   Cancer Father        Hodgkin's disease   COPD Mother    Heart attack Maternal Grandfather 80   Diabetes Neg Hx    Stroke Neg Hx    Hypertension Neg Hx    Hyperlipidemia Neg Hx    Social History   Socioeconomic History   Marital  status: Married    Spouse name: Manuela Schwartz   Number of children: 2   Years of education: High school   Highest education level: Not on file  Occupational History   Occupation: burial Occupational hygienist: Doctor, hospital    Comment: [employer]  Tobacco Use   Smoking status: Former    Packs/day: 1.00    Years: 14.00    Pack years: 14.00    Types: Cigarettes    Quit date: 07/27/1999    Years since quitting: 21.5   Smokeless tobacco: Never  Vaping Use   Vaping Use: Never used  Substance and Sexual Activity   Alcohol use: Not Currently    Alcohol/week: 24.0 standard drinks    Types: 24 Cans of beer per week    Comment: 4-5 beers per day, case of beer per week   Drug use: No   Sexual activity: Yes    Birth control/protection: Post-menopausal  Other Topics Concern   Not on file  Social History Narrative   02/05/19   From[place] area   Living: Living with wife Manuela Schwartz   Work: Dealer      Family: 2 children - [name] and [name] - liver nearby, no grandkids  Enjoys: float down the river, race track, camping      Exercise: just keeping busy at work   Diet: mostly meat and potatoes      Safety   Seat belts: Yes    Guns: Yes  and secure   Safe in relationships: Yes    Social Determinants of Health   Financial Resource Strain: Not on file  Food Insecurity: Not on file  Transportation Needs: Not on file  Physical Activity: Not on file  Stress: Not on file  Social Connections: Not on file   Past Surgical History:  Procedure Laterality Date   CARPAL TUNNEL RELEASE Bilateral 2009   CATARACT EXTRACTION W/PHACO Left 12/17/2019   Procedure: CATARACT EXTRACTION PHACO AND INTRAOCULAR LENS PLACEMENT ([place]) LEFT;  Surgeon: Eulogio Bear, MD;  Location: [place];  Service: Ophthalmology;  Laterality: Left;  3.61 0:26.9   CATARACT EXTRACTION W/PHACO Right 03/17/2020   Procedure: CATARACT EXTRACTION PHACO AND INTRAOCULAR LENS PLACEMENT (IOC) RIGHT 4.41  00:28.1;  Surgeon: Eulogio Bear, MD;  Location: [place];  Service: Ophthalmology;  Laterality: Right;   COLONOSCOPY W/ POLYPECTOMY     IR FLUORO GUIDED NEEDLE PLC ASPIRATION/INJECTION LOC  04/10/2020   JOINT REPLACEMENT     LACERATION REPAIR Right ~ 2012   leg   LUMBAR LAMINECTOMY/DECOMPRESSION MICRODISCECTOMY N/A 07/24/2019   Procedure: Decompressive Lumbar Laminectomy Lumbar three-four Lumbar four-five for Epidural Abscess;  Surgeon: Kary Kos, MD;  Location: [place];  Service: Neurosurgery;  Laterality: N/A;   TOTAL HIP ARTHROPLASTY Right 01/11/2013   Procedure: TOTAL HIP ARTHROPLASTY;  Surgeon: Garald Balding, MD;  Location: [place];  Service: Orthopedics;  Laterality: Right;   TOTAL HIP ARTHROPLASTY Left 10/12/2016   Procedure: TOTAL HIP ARTHROPLASTY;  Surgeon: Garald Balding, MD;  Location: [place];  Service: Orthopedics;  Laterality: Left;   TOTAL HIP ARTHROPLASTY Left 04/11/2020   Procedure: LEFT HIP IRRIGATION AND DEBRIDEMENT WITH HEAD AND POLY SWAP;  Surgeon: Leandrew Koyanagi, MD;  Location: [place];  Service: Orthopedics;  Laterality: Left;   TOTAL HIP REVISION Left 06/21/2020   Procedure: LEFT TOTAL HIP REVISION;  Surgeon: Leandrew Koyanagi, MD;  Location: [place];  Service: Orthopedics;  Laterality: Left;   Past Medical History:  Diagnosis Date   Cauda equina syndrome ([place]) 07/31/2019   Cerebral septic emboli ([place]) 07/30/2019   H/O MSSA epidural abscess, L2-L5 07/25/2019   History of chicken pox    Medical history non-contributory    BP 121/81 (BP Location: Right Arm, Patient Position: Sitting, Cuff Size: Large)   Pulse 85   Temp 98.8 F (37.1 C) (Oral)   Ht 6' (1.829 m)   Wt 262 lb 6.4 oz (119 kg)   SpO2 97%   BMI 35.59 kg/m   Opioid Risk Score:   Fall Risk Score:  `1  Depression screen PHQ 2/9  Depression screen [facility] 2/9 01/08/2021 12/31/2020 12/16/2020 10/21/2020 05/20/2020 02/05/2020 11/19/2019  Decreased Interest 2 0 0 0 0 2 3  Down, Depressed, Hopeless 0 0 0 0 0 2 2  PHQ - 2 Score 2 0 0  0 0 4 5  Altered sleeping 3 - - - - 3 3  Tired, decreased energy 0 - - - - 3 0  Change in appetite 0 - - - - 2 0  Feeling bad or failure about yourself  0 - - - - 3 0  Trouble concentrating 0 - - - - 0 0  Moving slowly or  fidgety/restless 0 - - - - 0 3  Suicidal thoughts 0 - - - - 0 0  PHQ-9 Score 5 - - - - 15 11  Difficult doing work/chores - - - - - Somewhat difficult Somewhat difficult      Review of Systems  Constitutional: Negative.   HENT: Negative.    Eyes: Negative.   Respiratory: Negative.    Cardiovascular: Negative.   Gastrointestinal: Negative.   Endocrine: Negative.   Genitourinary: Negative.   Musculoskeletal:  Positive for back pain and gait problem.       Spasms  Skin: Negative.   Allergic/Immunologic: Negative.   Hematological: Negative.   Psychiatric/Behavioral: Negative.        Objective:   Physical Exam Gen: no distress, normal appearing HEENT: oral mucosa pink and moist, NCAT Cardio: Reg rate Chest: normal effort, normal rate of breathing Abd: soft, non-distended Ext: no edema Psych: pleasant, normal affect Skin: intact Neuro: Alert and oriented x3 Musculoskeletal: Antalgic gait, disc bulge left L2-L3 palpable and tender with even light palpation.      Assessment & Plan:  1) Chronic Pain Syndrome secondary to L2-L3 osteophyte w/ infectious process, failed multiple surgeries.  -Urine sample and pain contract obtained. PDMP reviewed. Prescribed Percocet 5mg  BID on 7/22. He will f/u with [facility] monthly next 3 months, then me in 4 months. Discussed risks and benefits of this medication. 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  -Discussed current symptoms of pain and history of pain.  -Discussed benefits of exercise in reducing pain. -Discussed following foods that may reduce pain: 1) Ginger (especially studied for arthritis)- reduce leukotriene production to decrease inflammation 2) Blueberries- high in phytonutrients that  decrease inflammation 3) Salmon- marine omega-3s reduce joint swelling and pain 4) Pumpkin seeds- reduce inflammation 5) dark chocolate- reduces inflammation 6) turmeric- reduces inflammation 7) tart cherries - reduce pain and stiffness 8) extra virgin olive oil - its compound olecanthal helps to block prostaglandins  9) chili peppers- can be eaten or applied topically via capsaicin 10) mint- helpful for headache, muscle aches, joint pain, and itching 11) garlic- reduces inflammation  Link to further information on diet for chronic pain: [email or web address]   2) Irritability: Prescribed Cymbalta 20mg  that can help with both mood and pain

## 2021-02-09 ENCOUNTER — Other Ambulatory Visit: Payer: Self-pay | Admitting: Orthopaedic Surgery

## 2021-02-10 ENCOUNTER — Other Ambulatory Visit (HOSPITAL_COMMUNITY): Payer: Self-pay | Admitting: Physician Assistant

## 2021-02-10 NOTE — Telephone Encounter (Signed)
I sent in robaxin but he does not need aspirin.  I keep getting this refill request.  Can we somehow make it stop?

## 2021-02-11 ENCOUNTER — Telehealth: Payer: Self-pay

## 2021-02-11 LAB — TOXASSURE SELECT,+ANTIDEPR,UR

## 2021-02-11 NOTE — Telephone Encounter (Signed)
Pt is calling to get lab results on the drug screening that was ordered 02/05/2021.  Call back number is [phone]

## 2021-02-12 ENCOUNTER — Other Ambulatory Visit: Payer: Self-pay | Admitting: Physical Medicine and Rehabilitation

## 2021-02-12 NOTE — Telephone Encounter (Signed)
Urine drug screen for this encounter is consistent for prescribed medication 

## 2021-02-13 MED ORDER — OXYCODONE-ACETAMINOPHEN 5-325 MG PO TABS: 1.0000 | ORAL_TABLET | Freq: Two times a day (BID) | ORAL | 0 refills | Status: DC | PRN

## 2021-02-13 NOTE — Addendum Note (Signed)
Addended by: Izora Ribas on: 02/13/2021 03:04 PM   Modules accepted: Orders

## 2021-02-19 ENCOUNTER — Other Ambulatory Visit: Payer: Self-pay

## 2021-02-19 ENCOUNTER — Ambulatory Visit (INDEPENDENT_AMBULATORY_CARE_PROVIDER_SITE_OTHER): Payer: BC Managed Care – PPO | Admitting: Internal Medicine

## 2021-02-19 ENCOUNTER — Encounter: Payer: Self-pay | Admitting: Internal Medicine

## 2021-02-19 DIAGNOSIS — T8452XD Infection and inflammatory reaction due to internal left hip prosthesis, subsequent encounter: Secondary | ICD-10-CM | POA: Diagnosis not present

## 2021-02-19 DIAGNOSIS — G061 Intraspinal abscess and granuloma: Secondary | ICD-10-CM

## 2021-02-19 NOTE — Assessment & Plan Note (Signed)
Will continue chronic suppressive cephalexin and rifampin.

## 2021-02-19 NOTE — Progress Notes (Signed)
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 ([place]) 06/20/2020   Adjustment disorder with depressed mood 11/19/2019   Left hip pain 10/09/2019   Blurry vision, left eye 10/09/2019   Hyponatremia 08/20/2019   S/P lumbar laminectomy 07/25/2019   Anemia 07/18/2019   Obesity (BMI 35.0-39.9 without comorbidity) 07/18/2019   Constipation 07/18/2019   Elevated blood sugar 04/01/2017   Alcohol abuse, in remission 10/12/2016   S/P total hip arthroplasty 10/12/2016    Patient's Medications  New Prescriptions   No medications on file  Previous Medications   ASPIRIN EC 81 MG TABLET    Take 1 tablet (81 mg total) by mouth 2 (two) times daily as needed.   CA CARBONATE-MAG HYDROXIDE (ROLAIDS PO)    Take 1 tablet by mouth daily as needed (heartburn).   CEPHALEXIN (KEFLEX) 500 MG CAPSULE    Take 1 capsule (500 mg total) by mouth 2 (two) times daily.   CYCLOBENZAPRINE (FLEXERIL) 10 MG TABLET    TAKE 1 TABLET BY MOUTH THREE TIMES A DAY AS NEEDED   DICLOFENAC (VOLTAREN) 75 MG EC TABLET    Take 1 tablet (75 mg total) by mouth 2 (two) times daily as needed.   DULOXETINE (CYMBALTA) 20 MG CAPSULE    Take 1 capsule (20 mg total) by mouth daily.   ESCITALOPRAM (LEXAPRO) 20 MG TABLET    Take 20 mg by mouth daily.   MELOXICAM (MOBIC) 7.5 MG TABLET    1 tab po daily prn pain   METHOCARBAMOL (ROBAXIN) 750 MG TABLET    Take 1 tablet (750 mg total) by mouth 2 (two) times daily as needed for muscle  spasms.   OXYCODONE-ACETAMINOPHEN (PERCOCET) 5-325 MG TABLET    Take 1 tablet by mouth 2 (two) times daily as needed for severe pain.   RIFAMPIN (RIFADIN) 300 MG CAPSULE    Take by mouth.   TRAMADOL (ULTRAM) 50 MG TABLET    Take 50 mg by mouth every 6 (six) hours as needed.  Modified Medications   No medications on file  Discontinued Medications   No medications on file    Subjective: [name] is in for his routine follow-up visit.  He had MSSA bacteremia and lumbar infection in December of 2020.  He has a prosthetic left hip.  Hip aspirate Gram stain and cultures were negative.  He underwent lumbar decompression and laminectomy followed by 54 days of IV antibiotic therapy completing treatment on 09/17/2019. His infection appeared to have been cured. He did well and was slowly improving until he woke up on the morning of 04/05/2020 with severe left hip pain. He underwent MRI scan which showed a left acetabular osteomyelitis, adjacent myositis and a small iliac is abscess. Repeat blood cultures grew MSSA again.    He underwent incision and drainage of his left hip.  PICC was placed and he was discharged on IV cefazolin  and oral rifampin.  He completed 6 weeks of IV therapy in October before switching to oral cephalexin and rifampin.     He dislocated his left hip requiring repeat surgery in late November.  He underwent incision and drainage with repeat polyexchange.  The operative note indicated there was a seroma but otherwise no evidence of infection.  No specimens were submitted for stain or culture.    He recently developed acute left lower back pain again.  He did not have any obvious injury prior to the pain getting worse.  He says that he now has difficulty differentiating between his left lower back and left hip pain.  The pain is 4-5 out of 10 at rest but gets much worse with any exertion.  He feels like he has some new swelling in his lower back. He underwent a lumbar MRI on 12/24/2020 which  showed:  IMPRESSION: 1. Focal inflammatory changes at a bulky L2-3 left far-lateral osteophyte which could be degenerative or infectious. Given the prior findings, infection seems more likely. No abscess. 2. Lumbar spine degeneration without high-grade stenosis.  I asked him to follow-up with his neurosurgeon, Dr. Kary Kos.  Unfortunately he missed his visit.  He says it has been rescheduled but he is not sure when.  Review of Systems: Review of Systems  Constitutional:  Negative for chills, diaphoresis and fever.  Gastrointestinal:  Negative for abdominal pain, diarrhea, nausea and vomiting.  Musculoskeletal:  Positive for back pain and joint pain.   Past Medical History:  Diagnosis Date   Cauda equina syndrome ([place]) 07/31/2019   Cerebral septic emboli ([place]) 07/30/2019   H/O MSSA epidural abscess, L2-L5 07/25/2019   History of chicken pox    Medical history non-contributory     Social History   Tobacco Use   Smoking status: Former    Packs/day: 1.00    Years: 14.00    Pack years: 14.00    Types: Cigarettes    Quit date: 07/27/1999    Years since quitting: 21.5   Smokeless tobacco: Never  Vaping Use   Vaping Use: Never used  Substance Use Topics   Alcohol use: Not Currently    Alcohol/week: 24.0 standard drinks    Types: 24 Cans of beer per week    Comment: 4-5 beers per day, case of beer per week   Drug use: No    Family History  Problem Relation Age of Onset   Cancer Father        Hodgkin's disease   COPD Mother    Heart attack Maternal Grandfather 15   Diabetes Neg Hx    Stroke Neg Hx    Hypertension Neg Hx    Hyperlipidemia Neg Hx     Allergies  Allergen Reactions   Bee Venom Anaphylaxis   Hydrocodone Nausea And Vomiting    (12/10/19 - pt says he has taken without issues)    Objective: Vitals:   02/19/21 1051  BP: 134/86  Pulse: 80  Temp: 98.4 F (36.9 C)  TempSrc: Oral  SpO2: 96%  Weight: 263 lb (119.3 kg)  Height: 6' (1.829 m)   Body mass  index is 35.67 kg/m.  Physical Exam Constitutional:      Comments: He appears uncomfortable due to pain.  Cardiovascular:     Rate and Rhythm: Normal rate.  Pulmonary:     Effort: Pulmonary effort is normal.  Musculoskeletal:     Comments: I do not detect any unusual swelling, redness or warmth over his  lower back.  Surgical incision has healed nicely.    Lab Results Sed Rate  Date Value  10/21/2020 14 mm/h  07/29/2020 11 mm/h  04/09/2020 78 mm/hr (H)   CRP  Date Value  10/21/2020 11.1 mg/L (H)  07/29/2020 3.6 mg/L  04/10/2020 20.5 mg/dL (H)      Problem List Items Addressed This Visit       High   Abscess in epidural space of lumbar spine    It is unclear to me if the inflammatory changes seen on his recent MRI reflect degenerative arthritis, infection or both.  I will see him back after he follows up with Dr. Saintclair Halsted.       Left hip prosthetic joint infection ([place])    Will continue chronic suppressive cephalexin and rifampin.         [name] Bickers, MD [facility] [phone] pager   [phone] cell 02/19/2021, 11:06 AM

## 2021-02-19 NOTE — Assessment & Plan Note (Signed)
It is unclear to me if the inflammatory changes seen on his recent MRI reflect degenerative arthritis, infection or both.  I will see him back after he follows up with Dr. Saintclair Halsted.

## 2021-03-04 ENCOUNTER — Other Ambulatory Visit: Payer: Self-pay | Admitting: Orthopaedic Surgery

## 2021-03-04 ENCOUNTER — Other Ambulatory Visit: Payer: Self-pay | Admitting: Physician Assistant

## 2021-03-04 ENCOUNTER — Other Ambulatory Visit: Payer: Self-pay | Admitting: Family Medicine

## 2021-03-04 MED ORDER — METHOCARBAMOL 750 MG PO TABS: 750.0000 mg | ORAL_TABLET | Freq: Two times a day (BID) | ORAL | 3 refills | Status: DC | PRN

## 2021-03-04 NOTE — Telephone Encounter (Signed)
Sent in [place].  Does not need aspirin any longer.

## 2021-03-06 ENCOUNTER — Encounter: Payer: BC Managed Care – PPO | Attending: Physical Medicine and Rehabilitation | Admitting: Registered Nurse

## 2021-03-06 ENCOUNTER — Encounter: Payer: Self-pay | Admitting: Registered Nurse

## 2021-03-06 ENCOUNTER — Other Ambulatory Visit: Payer: Self-pay

## 2021-03-06 ENCOUNTER — Other Ambulatory Visit: Payer: Self-pay | Admitting: Physical Medicine and Rehabilitation

## 2021-03-06 VITALS — BP 148/83 | HR 67 | Temp 97.9°F | Ht 72.0 in | Wt 264.0 lb

## 2021-03-06 DIAGNOSIS — Z9889 Other specified postprocedural states: Secondary | ICD-10-CM | POA: Diagnosis not present

## 2021-03-06 DIAGNOSIS — Z5181 Encounter for therapeutic drug level monitoring: Secondary | ICD-10-CM | POA: Insufficient documentation

## 2021-03-06 DIAGNOSIS — Z79899 Other long term (current) drug therapy: Secondary | ICD-10-CM | POA: Diagnosis not present

## 2021-03-06 DIAGNOSIS — G894 Chronic pain syndrome: Secondary | ICD-10-CM | POA: Diagnosis not present

## 2021-03-06 MED ORDER — OXYCODONE-ACETAMINOPHEN 5-325 MG PO TABS: 1.0000 | ORAL_TABLET | Freq: Two times a day (BID) | ORAL | 0 refills | Status: DC | PRN

## 2021-03-06 NOTE — Progress Notes (Signed)
Subjective:    Patient ID: [name], male    DOB: 1966/01/13, 55 y.o.   MRN: [id number]  HPI: [name] is a 55 y.o. male who returns for follow up appointment for chronic pain and medication refill. He states his  pain is located in his lower back, also reports he has to take his Oxycodone twice a day due to increase intensity of lower back pain. His tablets increased today, he verbalizes understanding.He  rates his pain 4. His current exercise regime is walking and performing stretching exercises.    Mr. Butner Morphine equivalent is 12.50 MME.   LAS UDS was Performed on 02/05/2021 , it was consistent.   Pain Inventory Average Pain 6 Pain Right Now 4 My pain is sharp, burning, and stabbing  In the last 24 hours, has pain interfered with the following? General activity 2 Relation with others 4 Enjoyment of life 3 What TIME of day is your pain at its worst? evening and night Sleep (in general) Poor  Pain is worse with: walking, bending, standing, and some activites Pain improves with:  n/a Relief from Meds:  n/a  Family History  Problem Relation Age of Onset   Cancer Father        Hodgkin's disease   COPD Mother    Heart attack Maternal Grandfather 80   Diabetes Neg Hx    Stroke Neg Hx    Hypertension Neg Hx    Hyperlipidemia Neg Hx    Social History   Socioeconomic History   Marital status: Married    Spouse name: Manuela Schwartz   Number of children: 2   Years of education: High school   Highest education level: Not on file  Occupational History   Occupation: burial Occupational hygienist: Doctor, hospital    Comment: [employer]  Tobacco Use   Smoking status: Former    Packs/day: 1.00    Years: 14.00    Pack years: 14.00    Types: Cigarettes    Quit date: 07/27/1999    Years since quitting: 21.6   Smokeless tobacco: Never  Vaping Use   Vaping Use: Never used  Substance and Sexual Activity   Alcohol use: Not Currently    Alcohol/week: 24.0 standard drinks    Types: 24  Cans of beer per week    Comment: 4-5 beers per day, case of beer per week   Drug use: No   Sexual activity: Yes    Birth control/protection: Post-menopausal  Other Topics Concern   Not on file  Social History Narrative   02/05/19   From: the area   Living: Living with wife Manuela Schwartz   Work: Dealer      Family: 2 children - [name] and [name] - liver nearby, no grandkids      Enjoys: float [place], race track, camping      Exercise: just keeping busy at work   Diet: mostly meat and potatoes      Safety   Seat belts: Yes    Guns: Yes  and secure   Safe in relationships: Yes    Social Determinants of Health   Financial Resource Strain: Not on file  Food Insecurity: Not on file  Transportation Needs: Not on file  Physical Activity: Not on file  Stress: Not on file  Social Connections: Not on file   Past Surgical History:  Procedure Laterality Date   CARPAL TUNNEL RELEASE Bilateral 2009   CATARACT EXTRACTION W/PHACO Left 12/17/2019  Procedure: CATARACT EXTRACTION PHACO AND INTRAOCULAR LENS PLACEMENT ([place]) LEFT;  Surgeon: Eulogio Bear, MD;  Location: [facility];  Service: Ophthalmology;  Laterality: Left;  3.61 0:26.9   CATARACT EXTRACTION W/PHACO Right 03/17/2020   Procedure: CATARACT EXTRACTION PHACO AND INTRAOCULAR LENS PLACEMENT ([facility]) RIGHT 4.41  00:28.1;  Surgeon: Eulogio Bear, MD;  Location: [facility];  Service: Ophthalmology;  Laterality: Right;   COLONOSCOPY W/ POLYPECTOMY     IR FLUORO GUIDED NEEDLE PLC ASPIRATION/INJECTION LOC  04/10/2020   JOINT REPLACEMENT     LACERATION REPAIR Right ~ 2012   leg   LUMBAR LAMINECTOMY/DECOMPRESSION MICRODISCECTOMY N/A 07/24/2019   Procedure: Decompressive Lumbar Laminectomy Lumbar three-four Lumbar four-five for Epidural Abscess;  Surgeon: Kary Kos, MD;  Location: [facility];  Service: Neurosurgery;  Laterality: N/A;   TOTAL HIP ARTHROPLASTY Right 01/11/2013   Procedure: TOTAL HIP ARTHROPLASTY;   Surgeon: Garald Balding, MD;  Location: [facility];  Service: Orthopedics;  Laterality: Right;   TOTAL HIP ARTHROPLASTY Left 10/12/2016   Procedure: TOTAL HIP ARTHROPLASTY;  Surgeon: Garald Balding, MD;  Location: [facility];  Service: Orthopedics;  Laterality: Left;   TOTAL HIP ARTHROPLASTY Left 04/11/2020   Procedure: LEFT HIP IRRIGATION AND DEBRIDEMENT WITH HEAD AND POLY SWAP;  Surgeon: Leandrew Koyanagi, MD;  Location: [facility];  Service: Orthopedics;  Laterality: Left;   TOTAL HIP REVISION Left 06/21/2020   Procedure: LEFT TOTAL HIP REVISION;  Surgeon: Leandrew Koyanagi, MD;  Location: [facility];  Service: Orthopedics;  Laterality: Left;   Past Surgical History:  Procedure Laterality Date   CARPAL TUNNEL RELEASE Bilateral 2009   CATARACT EXTRACTION W/PHACO Left 12/17/2019   Procedure: CATARACT EXTRACTION PHACO AND INTRAOCULAR LENS PLACEMENT ([place]) LEFT;  Surgeon: Eulogio Bear, MD;  Location: [facility];  Service: Ophthalmology;  Laterality: Left;  3.61 0:26.9   CATARACT EXTRACTION W/PHACO Right 03/17/2020   Procedure: CATARACT EXTRACTION PHACO AND INTRAOCULAR LENS PLACEMENT ([facility]) RIGHT 4.41  00:28.1;  Surgeon: Eulogio Bear, MD;  Location: [facility];  Service: Ophthalmology;  Laterality: Right;   COLONOSCOPY W/ POLYPECTOMY     IR FLUORO GUIDED NEEDLE PLC ASPIRATION/INJECTION LOC  04/10/2020   JOINT REPLACEMENT     LACERATION REPAIR Right ~ 2012   leg   LUMBAR LAMINECTOMY/DECOMPRESSION MICRODISCECTOMY N/A 07/24/2019   Procedure: Decompressive Lumbar Laminectomy Lumbar three-four Lumbar four-five for Epidural Abscess;  Surgeon: Kary Kos, MD;  Location: [facility];  Service: Neurosurgery;  Laterality: N/A;   TOTAL HIP ARTHROPLASTY Right 01/11/2013   Procedure: TOTAL HIP ARTHROPLASTY;  Surgeon: Garald Balding, MD;  Location: [facility];  Service: Orthopedics;  Laterality: Right;   TOTAL HIP ARTHROPLASTY Left 10/12/2016   Procedure: TOTAL HIP ARTHROPLASTY;  Surgeon: Garald Balding, MD;   Location: [facility];  Service: Orthopedics;  Laterality: Left;   TOTAL HIP ARTHROPLASTY Left 04/11/2020   Procedure: LEFT HIP IRRIGATION AND DEBRIDEMENT WITH HEAD AND POLY SWAP;  Surgeon: Leandrew Koyanagi, MD;  Location: [facility];  Service: Orthopedics;  Laterality: Left;   TOTAL HIP REVISION Left 06/21/2020   Procedure: LEFT TOTAL HIP REVISION;  Surgeon: Leandrew Koyanagi, MD;  Location: [facility];  Service: Orthopedics;  Laterality: Left;   Past Medical History:  Diagnosis Date   Cauda equina syndrome ([place]) 07/31/2019   Cerebral septic emboli ([place]) 07/30/2019   H/O MSSA epidural abscess, L2-L5 07/25/2019   History of chicken pox    Medical history non-contributory    BP (!) 148/83  Pulse 67   Temp 97.9 F (36.6 C)   Ht 6' (1.829 m)   Wt 264 lb (119.7 kg)   SpO2 98%   BMI 35.80 kg/m   Opioid Risk Score:   Fall Risk Score:  `1  Depression screen PHQ 2/9  Depression screen [facility] 2/9 02/19/2021 02/05/2021 01/08/2021 12/31/2020 12/16/2020 10/21/2020 05/20/2020  Decreased Interest 0 0 2 0 0 0 0  Down, Depressed, Hopeless 0 0 0 0 0 0 0  PHQ - 2 Score 0 0 2 0 0 0 0  Altered sleeping - - 3 - - - -  Tired, decreased energy - - 0 - - - -  Change in appetite - - 0 - - - -  Feeling bad or failure about yourself  - - 0 - - - -  Trouble concentrating - - 0 - - - -  Moving slowly or fidgety/restless - - 0 - - - -  Suicidal thoughts - - 0 - - - -  PHQ-9 Score - - 5 - - - -  Difficult doing work/chores - - - - - - -      Review of Systems  Constitutional: Negative.   HENT: Negative.    Eyes: Negative.   Respiratory: Negative.    Cardiovascular: Negative.   Gastrointestinal: Negative.   Endocrine: Negative.   Genitourinary: Negative.   Musculoskeletal:  Positive for back pain.  Skin: Negative.   Allergic/Immunologic: Negative.   Neurological: Negative.   Hematological: Negative.   Psychiatric/Behavioral: Negative.        Objective:   Physical Exam Vitals and nursing note reviewed.  Constitutional:       Appearance: Normal appearance.  Cardiovascular:     Rate and Rhythm: Normal rate and regular rhythm.     Pulses: Normal pulses.     Heart sounds: Normal heart sounds.  Pulmonary:     Effort: Pulmonary effort is normal.     Breath sounds: Normal breath sounds.  Musculoskeletal:     Cervical back: Normal range of motion and neck supple.     Comments: Normal Muscle Bulk and Muscle Testing Reveals:  Upper Extremities: Full ROM and Muscle Strength 5/5  Lumbar Hypersensitivity Lower Extremities: Right Full ROM and Muscle Strength 5/5 Left Lower Extremity: Decreased ROM and Muscle Strength 5/5 Left Lower Extremity Flexion Produces Pain into his Left hip and lower back  Arises from chair with ease Narrow Based Gait     Skin:    General: Skin is warm and dry.  Neurological:     Mental Status: He is alert and oriented to person, place, and time.  Psychiatric:        Mood and Affect: Mood normal.        Behavior: Behavior normal.          Assessment & Plan:  S/P Lumbar Laminectomy: Continue HEP as Tolerated. Continue to Monitor.  Chromic Pain Syndrome: Refilled: Oxycodone 5/325 mg one tablet twice a day as needed for pain #45 We will continue the opioid monitoring program, this consists of regular clinic visits, examinations, urine drug screen, pill counts as well as use of [place] Controlled Substance Reporting system. A 12 month History has been reviewed on the [place] Today.    F/U in 1 month

## 2021-04-02 ENCOUNTER — Other Ambulatory Visit: Payer: Self-pay | Admitting: Student

## 2021-04-02 ENCOUNTER — Other Ambulatory Visit: Payer: Self-pay | Admitting: Physician Assistant

## 2021-04-02 ENCOUNTER — Other Ambulatory Visit: Payer: Self-pay | Admitting: Physical Medicine and Rehabilitation

## 2021-04-02 ENCOUNTER — Encounter: Payer: Self-pay | Admitting: Registered Nurse

## 2021-04-02 ENCOUNTER — Other Ambulatory Visit: Payer: Self-pay

## 2021-04-02 ENCOUNTER — Encounter: Payer: 59 | Attending: Physical Medicine and Rehabilitation | Admitting: Registered Nurse

## 2021-04-02 ENCOUNTER — Other Ambulatory Visit (HOSPITAL_COMMUNITY): Payer: Self-pay | Admitting: Student

## 2021-04-02 VITALS — BP 136/80 | HR 71 | Temp 99.0°F | Ht 72.0 in | Wt 263.0 lb

## 2021-04-02 DIAGNOSIS — M545 Low back pain, unspecified: Secondary | ICD-10-CM | POA: Diagnosis not present

## 2021-04-02 DIAGNOSIS — M5416 Radiculopathy, lumbar region: Secondary | ICD-10-CM

## 2021-04-02 DIAGNOSIS — Z5181 Encounter for therapeutic drug level monitoring: Secondary | ICD-10-CM | POA: Diagnosis not present

## 2021-04-02 DIAGNOSIS — Z79899 Other long term (current) drug therapy: Secondary | ICD-10-CM | POA: Insufficient documentation

## 2021-04-02 DIAGNOSIS — M7062 Trochanteric bursitis, left hip: Secondary | ICD-10-CM

## 2021-04-02 DIAGNOSIS — G894 Chronic pain syndrome: Secondary | ICD-10-CM | POA: Insufficient documentation

## 2021-04-02 DIAGNOSIS — G8929 Other chronic pain: Secondary | ICD-10-CM | POA: Insufficient documentation

## 2021-04-02 DIAGNOSIS — Z9889 Other specified postprocedural states: Secondary | ICD-10-CM | POA: Insufficient documentation

## 2021-04-02 MED ORDER — OXYCODONE-ACETAMINOPHEN 5-325 MG PO TABS: 1.0000 | ORAL_TABLET | Freq: Two times a day (BID) | ORAL | 0 refills | Status: DC | PRN

## 2021-04-02 NOTE — Progress Notes (Signed)
Subjective:    Patient ID: [name], male    DOB: 07/14/1966, 55 y.o.   MRN: [id number]  HPI: [name] is a 56 y.o. male who returns for follow up appointment for chronic pain and medication refill. He states his pain is located in his lower back and left hip pain.He rates his pain 4. His current exercise regime is walking and performing stretching exercises.   Mr. Docherty Morphine equivalent is 16.25 MME.  Mr. Gulbrandson reports when he takes his medication twice a day his pain is controlled, tablets increased #55. We will reassess next month, he verbalizes understanding.    Last UDS was Performed on 02/05/2021, it was consistent.   Pain Inventory Average Pain 7 Pain Right Now 4 My pain is sharp and stabbing  In the last 24 hours, has pain interfered with the following? General activity 3 Relation with others 3 Enjoyment of life 3 What TIME of day is your pain at its worst? daytime, evening, and night Sleep (in general) Poor  Pain is worse with: walking, bending, standing, and some activites Pain improves with: rest and medication Relief from Meds:  ?  Family History  Problem Relation Age of Onset   Cancer Father        Hodgkin's disease   COPD Mother    Heart attack Maternal Grandfather 80   Diabetes Neg Hx    Stroke Neg Hx    Hypertension Neg Hx    Hyperlipidemia Neg Hx    Social History   Socioeconomic History   Marital status: Married    Spouse name: Manuela Schwartz   Number of children: 2   Years of education: High school   Highest education level: Not on file  Occupational History   Occupation: burial Occupational hygienist: Doctor, hospital    Comment: [employer]  Tobacco Use   Smoking status: Former    Packs/day: 1.00    Years: 14.00    Pack years: 14.00    Types: Cigarettes    Quit date: 07/27/1999    Years since quitting: 21.6   Smokeless tobacco: Never  Vaping Use   Vaping Use: Never used  Substance and Sexual Activity   Alcohol use: Not Currently     Alcohol/week: 24.0 standard drinks    Types: 24 Cans of beer per week    Comment: 4-5 beers per day, case of beer per week   Drug use: No   Sexual activity: Yes    Birth control/protection: Post-menopausal  Other Topics Concern   Not on file  Social History Narrative   02/05/19   From: the area   Living: Living with wife Manuela Schwartz   Work: Dealer      Family: 2 children - [name] and [name] - liver nearby, no grandkids      Enjoys: float down the river, race track, camping      Exercise: just keeping busy at work   Diet: mostly meat and potatoes      Safety   Seat belts: Yes    Guns: Yes  and secure   Safe in relationships: Yes    Social Determinants of Radio broadcast assistant Strain: Not on file  Food Insecurity: Not on file  Transportation Needs: Not on file  Physical Activity: Not on file  Stress: Not on file  Social Connections: Not on file   Past Surgical History:  Procedure Laterality Date   CARPAL TUNNEL RELEASE Bilateral 2009   CATARACT EXTRACTION  W/PHACO Left 12/17/2019   Procedure: CATARACT EXTRACTION PHACO AND INTRAOCULAR LENS PLACEMENT (IOC) LEFT;  Surgeon: Eulogio Bear, MD;  Location: [place];  Service: Ophthalmology;  Laterality: Left;  3.61 0:26.9   CATARACT EXTRACTION W/PHACO Right 03/17/2020   Procedure: CATARACT EXTRACTION PHACO AND INTRAOCULAR LENS PLACEMENT (IOC) RIGHT 4.41  00:28.1;  Surgeon: Eulogio Bear, MD;  Location: [facility];  Service: Ophthalmology;  Laterality: Right;   COLONOSCOPY W/ POLYPECTOMY     IR FLUORO GUIDED NEEDLE PLC ASPIRATION/INJECTION LOC  04/10/2020   JOINT REPLACEMENT     LACERATION REPAIR Right ~ 2012   leg   LUMBAR LAMINECTOMY/DECOMPRESSION MICRODISCECTOMY N/A 07/24/2019   Procedure: Decompressive Lumbar Laminectomy Lumbar three-four Lumbar four-five for Epidural Abscess;  Surgeon: Kary Kos, MD;  Location: [facility];  Service: Neurosurgery;  Laterality: N/A;   TOTAL HIP ARTHROPLASTY Right  01/11/2013   Procedure: TOTAL HIP ARTHROPLASTY;  Surgeon: Garald Balding, MD;  Location: [facility];  Service: Orthopedics;  Laterality: Right;   TOTAL HIP ARTHROPLASTY Left 10/12/2016   Procedure: TOTAL HIP ARTHROPLASTY;  Surgeon: Garald Balding, MD;  Location: [place];  Service: Orthopedics;  Laterality: Left;   TOTAL HIP ARTHROPLASTY Left 04/11/2020   Procedure: LEFT HIP IRRIGATION AND DEBRIDEMENT WITH HEAD AND POLY SWAP;  Surgeon: Leandrew Koyanagi, MD;  Location: [place];  Service: Orthopedics;  Laterality: Left;   TOTAL HIP REVISION Left 06/21/2020   Procedure: LEFT TOTAL HIP REVISION;  Surgeon: Leandrew Koyanagi, MD;  Location: [place];  Service: Orthopedics;  Laterality: Left;   Past Surgical History:  Procedure Laterality Date   CARPAL TUNNEL RELEASE Bilateral 2009   CATARACT EXTRACTION W/PHACO Left 12/17/2019   Procedure: CATARACT EXTRACTION PHACO AND INTRAOCULAR LENS PLACEMENT ([place]) LEFT;  Surgeon: Eulogio Bear, MD;  Location: [facility];  Service: Ophthalmology;  Laterality: Left;  3.61 0:26.9   CATARACT EXTRACTION W/PHACO Right 03/17/2020   Procedure: CATARACT EXTRACTION PHACO AND INTRAOCULAR LENS PLACEMENT (IOC) RIGHT 4.41  00:28.1;  Surgeon: Eulogio Bear, MD;  Location: [place];  Service: Ophthalmology;  Laterality: Right;   COLONOSCOPY W/ POLYPECTOMY     IR FLUORO GUIDED NEEDLE PLC ASPIRATION/INJECTION LOC  04/10/2020   JOINT REPLACEMENT     LACERATION REPAIR Right ~ 2012   leg   LUMBAR LAMINECTOMY/DECOMPRESSION MICRODISCECTOMY N/A 07/24/2019   Procedure: Decompressive Lumbar Laminectomy Lumbar three-four Lumbar four-five for Epidural Abscess;  Surgeon: Kary Kos, MD;  Location: [facility] [place];  Service: Neurosurgery;  Laterality: N/A;   TOTAL HIP ARTHROPLASTY Right 01/11/2013   Procedure: TOTAL HIP ARTHROPLASTY;  Surgeon: Garald Balding, MD;  Location: [facility];  Service: Orthopedics;  Laterality: Right;   TOTAL HIP ARTHROPLASTY Left 10/12/2016   Procedure: TOTAL HIP  ARTHROPLASTY;  Surgeon: Garald Balding, MD;  Location: [place];  Service: Orthopedics;  Laterality: Left;   TOTAL HIP ARTHROPLASTY Left 04/11/2020   Procedure: LEFT HIP IRRIGATION AND DEBRIDEMENT WITH HEAD AND POLY SWAP;  Surgeon: Leandrew Koyanagi, MD;  Location: [facility];  Service: Orthopedics;  Laterality: Left;   TOTAL HIP REVISION Left 06/21/2020   Procedure: LEFT TOTAL HIP REVISION;  Surgeon: Leandrew Koyanagi, MD;  Location: [place];  Service: Orthopedics;  Laterality: Left;   Past Medical History:  Diagnosis Date   Cauda equina syndrome ([place]) 07/31/2019   Cerebral septic emboli ([place]) 07/30/2019   H/O MSSA epidural abscess, L2-L5 07/25/2019   History of chicken pox    Medical history non-contributory  BP 136/80   Pulse 71   Temp 99 F (37.2 C)   Ht 6' (1.829 m)   Wt 263 lb (119.3 kg)   SpO2 98%   BMI 35.67 kg/m   Opioid Risk Score:   Fall Risk Score:  `1  Depression screen PHQ 2/9  Depression screen [facility] 2/9 02/19/2021 02/05/2021 01/08/2021 12/31/2020 12/16/2020 10/21/2020 05/20/2020  Decreased Interest 0 0 2 0 0 0 0  Down, Depressed, Hopeless 0 0 0 0 0 0 0  PHQ - 2 Score 0 0 2 0 0 0 0  Altered sleeping - - 3 - - - -  Tired, decreased energy - - 0 - - - -  Change in appetite - - 0 - - - -  Feeling bad or failure about yourself  - - 0 - - - -  Trouble concentrating - - 0 - - - -  Moving slowly or fidgety/restless - - 0 - - - -  Suicidal thoughts - - 0 - - - -  PHQ-9 Score - - 5 - - - -  Difficult doing work/chores - - - - - - -    Review of Systems  Constitutional: Negative.   HENT: Negative.    Eyes: Negative.   Cardiovascular: Negative.   Gastrointestinal: Negative.   Endocrine: Negative.   Genitourinary: Negative.   Musculoskeletal:  Positive for back pain.  Skin: Negative.   Allergic/Immunologic: Negative.   Neurological: Negative.   Hematological: Negative.   All other systems reviewed and are negative.     Objective:   Physical Exam Vitals and nursing note reviewed.   Constitutional:      Appearance: Normal appearance.  Cardiovascular:     Rate and Rhythm: Normal rate and regular rhythm.     Pulses: Normal pulses.     Heart sounds: Normal heart sounds.  Pulmonary:     Effort: Pulmonary effort is normal.     Breath sounds: Normal breath sounds.  Musculoskeletal:     Cervical back: Normal range of motion and neck supple.     Comments: Normal Muscle Bulk and Muscle Testing Reveals:  Upper Extremities: Full ROM and Muscle Strength 5/5 Lumbar Hypersensitivity Lower Extremities: Left Lower Extremity: Decreased ROM and Muscle Strength 5/5 Left Lower Extremity Flexion Produces Pain into his Lumbar and Left Hip Right Lower Extremity: Full ROM and Muscle Strength 5/5 Arises from chair slowly Antalgic  Gait     Skin:    General: Skin is warm and dry.  Neurological:     Mental Status: He is alert and oriented to person, place, and time.          Assessment & Plan:  S/P Lumbar Laminectomy: Continue HEP as Tolerated. Continue to Monitor. 04/02/2021 Chromic Pain Syndrome: Refilled: Oxycodone 5/325 mg one tablet twice a day as needed for pain #55 We will continue the opioid monitoring program, this consists of regular clinic visits, examinations, urine drug screen, pill counts as well as use of [place] Controlled Substance Reporting system. A 12 month History has been reviewed on the [place] on 04/02/2021     F/U in 1 month

## 2021-04-13 ENCOUNTER — Ambulatory Visit (HOSPITAL_COMMUNITY): Payer: BC Managed Care – PPO

## 2021-04-13 ENCOUNTER — Encounter (HOSPITAL_COMMUNITY): Payer: Self-pay

## 2021-04-17 ENCOUNTER — Other Ambulatory Visit: Payer: Self-pay

## 2021-04-17 ENCOUNTER — Ambulatory Visit (HOSPITAL_COMMUNITY)
Admission: RE | Admit: 2021-04-17 | Discharge: 2021-04-17 | Disposition: A | Discharge status: Home / Self Care | Payer: 59 | Source: Ambulatory Visit | Attending: Student | Admitting: Student

## 2021-04-17 DIAGNOSIS — M5416 Radiculopathy, lumbar region: Secondary | ICD-10-CM | POA: Insufficient documentation

## 2021-04-17 IMAGING — CT CT L SPINE W/O CM
3 series · 12 of 33 positions shown, 14 images · non-contrast
Comparison: Lumbar MRI [DATE] and earlier.

CT Abdomen and Pelvis [DATE].

CLINICAL DATA: 54-year-old male with a history of lumbar L3-L4 and
L4-L5 spinal infection secondary to MSSA bacteremia in [G3]. Left
hip osteomyelitis secondary to recurrent MSSA in [G3].

Recent onset recurrent acute left low back pain and indeterminate
endplate inflammation at L2-L3 on [facility] MRI. Subsequent encounter.
EXAM:
CT LUMBAR SPINE WITHOUT CONTRAST
TECHNIQUE: Multidetector CT imaging of the lumbar spine was performed without
intravenous contrast administration. Multiplanar CT image
reconstructions were also generated.

[Series 4: l spine soft · axial · 0.35mm/px · z∈[-255,-75]mm · 4 of 130 slices shown, 5 images]
[im 20/130  soft-tissue]
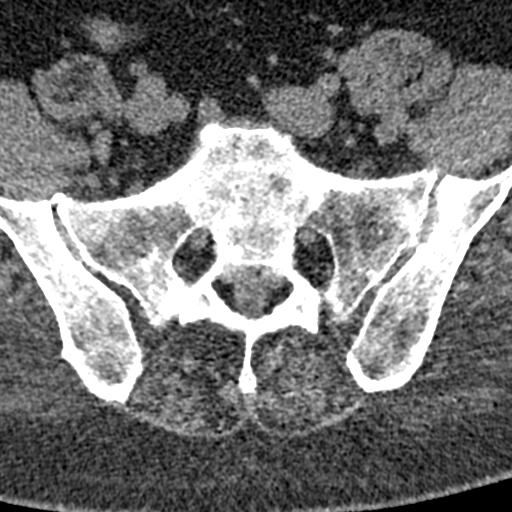
[im 20/130  bone]
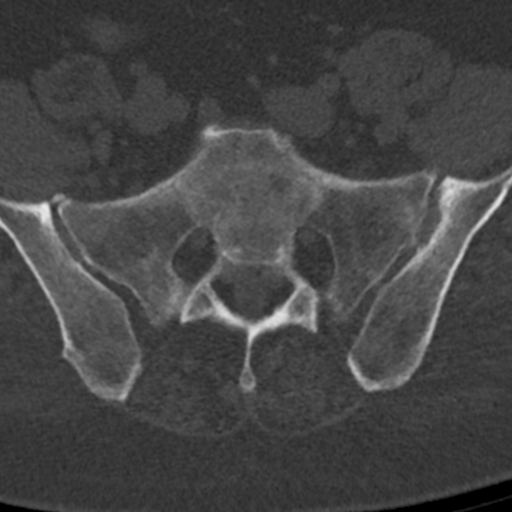
[im 50/130  bone]
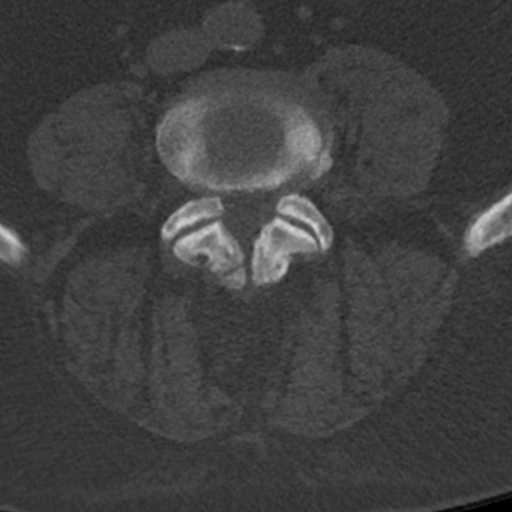
[im 80/130  bone]
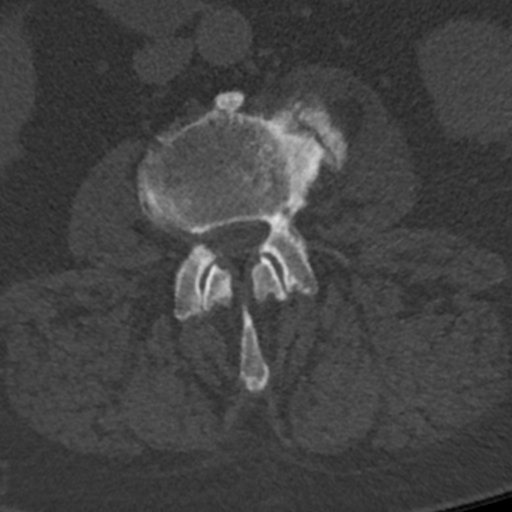
[im 110/130  bone]
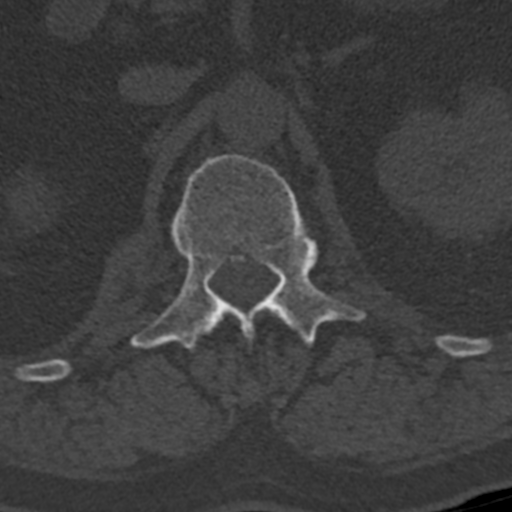

[Series 6: coronal bone · coronal · 0.37mm/px · 3 of 73 slices shown]
[im 15/73  bone]
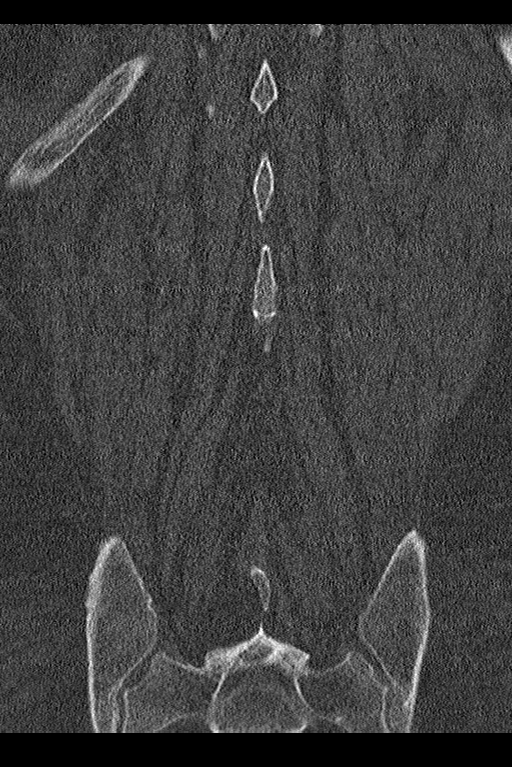
[im 29/73  bone]
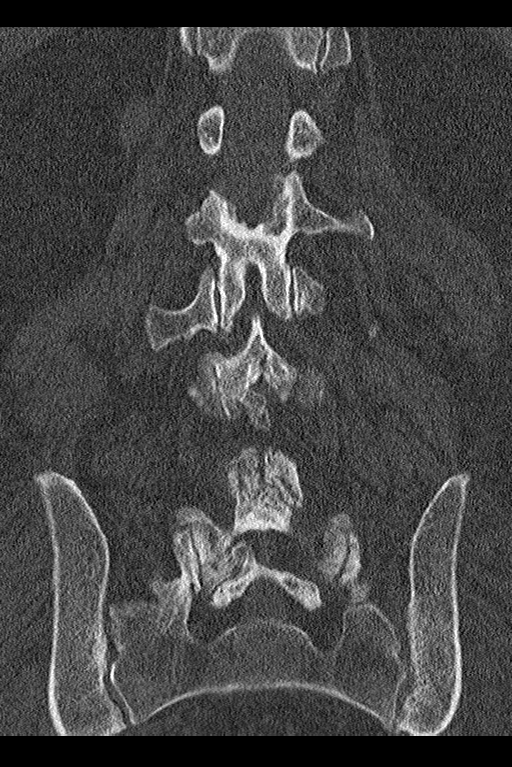
[im 44/73  bone]
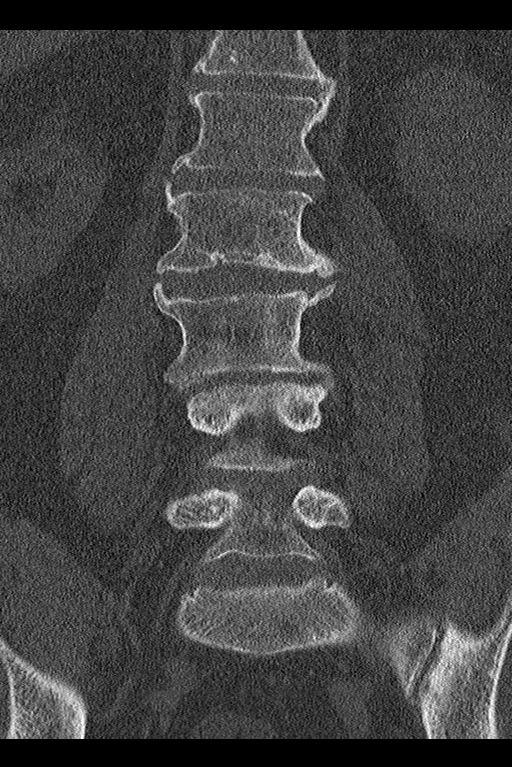

[Series 7: sagittal bone · sagittal · 0.39mm/px · 5 of 74 slices shown, 6 images]
[im 25/74  bone]
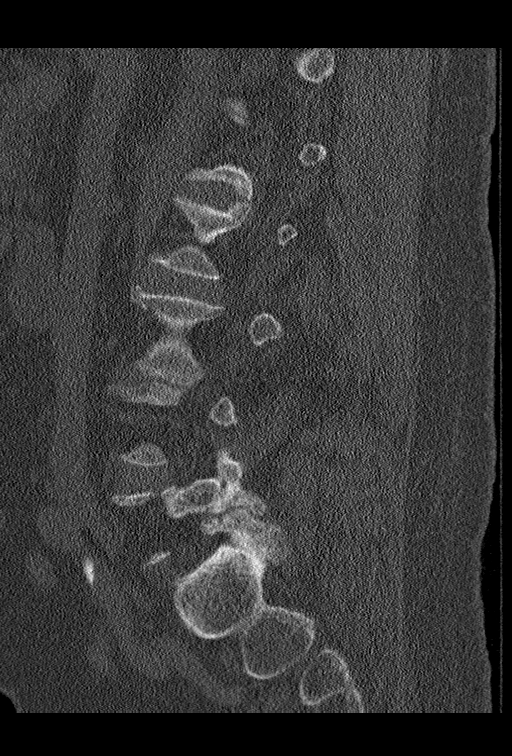
[im 31/74  bone]
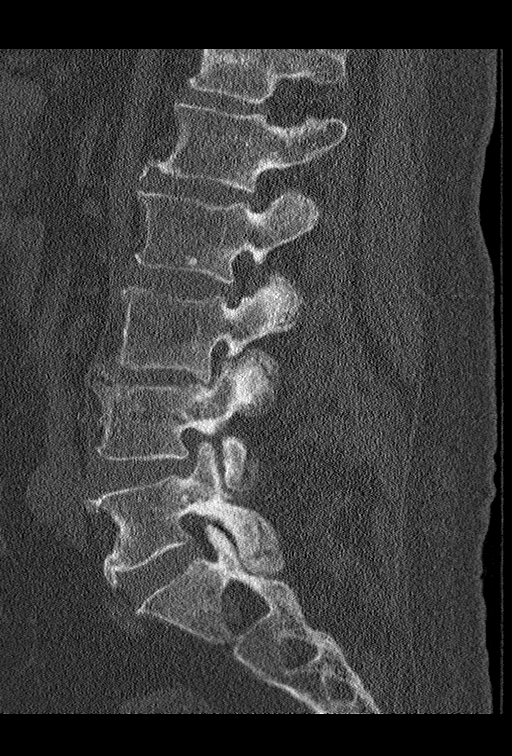
[im 37/74  soft-tissue]
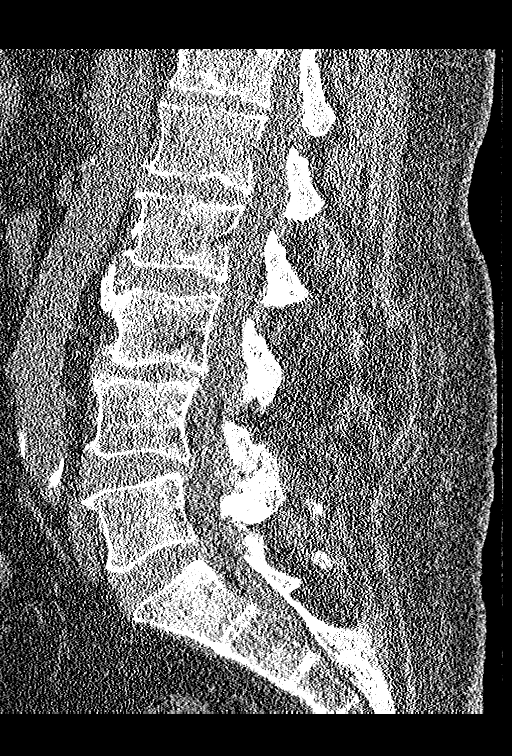
[im 37/74  bone]
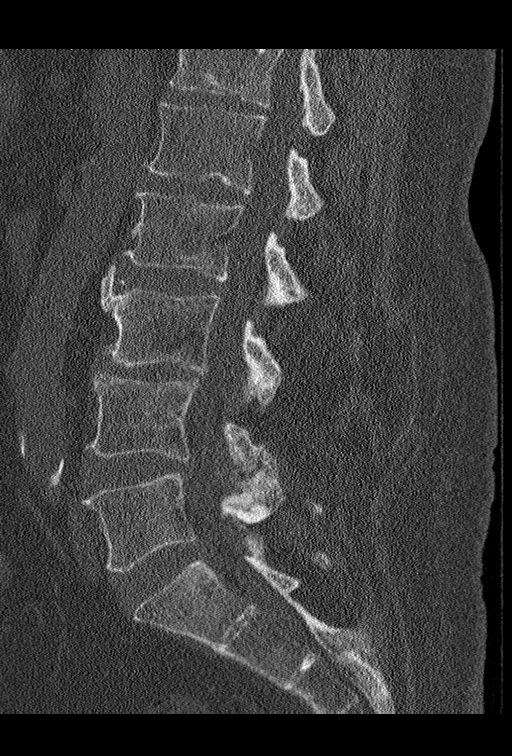
[im 43/74  bone]
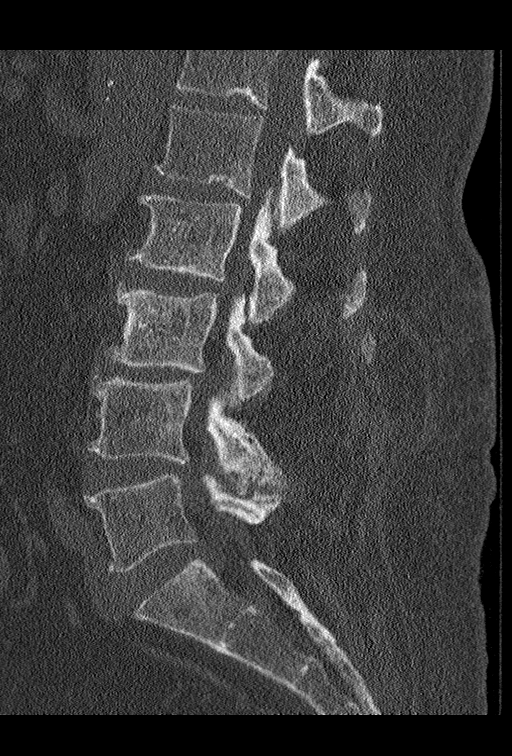
[im 49/74  bone]
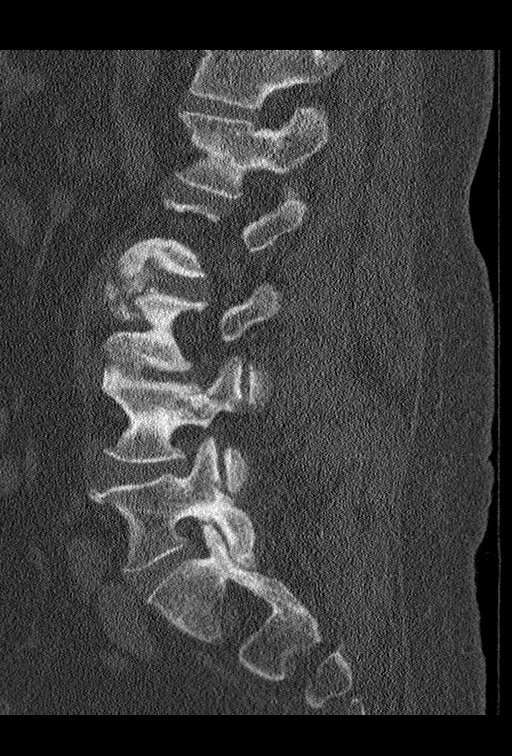

[12 of 33 positions shown; findings below may reference images not displayed]

FINDINGS: Segmentation: Normal, the same numbering system used previously.

Alignment: Continued stable vertebral height and alignment. Mild
retrolisthesis of L3 on L4 and L4 on L5 is stable. Subtle
dextroconvex lumbar scoliosis.

Vertebrae: Very bulky left far lateral L2-L3 and L3-L4 endplate
osteophytosis has progressed since the CT last year (coronal image
23) and results in a degree of interbody ankylosis at the latter.
However, there has been no lumbar endplate erosion since last year.
Stable vertebral body height. Intact visible sacrum and SI joints.
Benign vertebral body hemangiomas at L2 and L3 are stable. No acute
osseous abnormality identified.

Paraspinal and other soft tissues: Stable appearance of lumbar
paraspinal soft tissues. No paraspinal fluid or inflammation is
identified.

Aortoiliac calcified atherosclerosis. Negative visible noncontrast
abdominal viscera.

Disc levels:

Aside from the bulky left far lateral endplate osteophyte
progression described above, lumbar spine degeneration is stable
from the [facility] MRI. Sequelae of previous midline decompression at
L3-L4 and L4-L5 again noted, with bulky residual facet hypertrophy
at those levels, and moderate to severe facet hypertrophy at L5-S1
with vacuum facet.
IMPRESSION: 1. Progression of degenerative bulky endplate osteophytosis directed
far laterally to the left at both L2-L3 and L3-L4 since a CT in
[DATE]. But there has been no endplate erosion at that
time, and there is no CT evidence of acute spinal infection.

2. Other lumbar spine degeneration and postoperative changes appear
stable since the [facility] MRI.

## 2021-04-30 ENCOUNTER — Encounter: Payer: 59 | Attending: Physical Medicine and Rehabilitation | Admitting: Registered Nurse

## 2021-04-30 ENCOUNTER — Other Ambulatory Visit: Payer: Self-pay

## 2021-04-30 VITALS — BP 135/84 | HR 76 | Temp 98.2°F | Ht 72.0 in | Wt 261.2 lb

## 2021-04-30 DIAGNOSIS — G8929 Other chronic pain: Secondary | ICD-10-CM | POA: Insufficient documentation

## 2021-04-30 DIAGNOSIS — Z9889 Other specified postprocedural states: Secondary | ICD-10-CM | POA: Diagnosis not present

## 2021-04-30 DIAGNOSIS — Z5181 Encounter for therapeutic drug level monitoring: Secondary | ICD-10-CM | POA: Diagnosis present

## 2021-04-30 DIAGNOSIS — G894 Chronic pain syndrome: Secondary | ICD-10-CM

## 2021-04-30 DIAGNOSIS — M7062 Trochanteric bursitis, left hip: Secondary | ICD-10-CM | POA: Insufficient documentation

## 2021-04-30 DIAGNOSIS — Z79899 Other long term (current) drug therapy: Secondary | ICD-10-CM | POA: Diagnosis present

## 2021-04-30 DIAGNOSIS — M545 Low back pain, unspecified: Secondary | ICD-10-CM | POA: Diagnosis present

## 2021-04-30 MED ORDER — OXYCODONE-ACETAMINOPHEN 5-325 MG PO TABS: 1.0000 | ORAL_TABLET | Freq: Two times a day (BID) | ORAL | 0 refills | Status: DC | PRN

## 2021-04-30 NOTE — Progress Notes (Signed)
Subjective:    Patient ID: [name], male    DOB: 03/06/1966, 55 y.o.   MRN: [id number]  HPI: [name] is a 55 y.o. male who returns for follow up appointment for chronic pain and medication refill. He states his pain is located in his lower back radiating into his left lower extremity. He rates his pain 5. His current exercise regime is walking and performing stretching exercises.  Mr. Arca Morphine equivalent is 15.00 MME.   Last UDS was Performed on 02/05/2021, it was consistent.     Pain Inventory Average Pain 5 Pain Right Now 5 My pain is sharp, burning, and stabbing  In the last 24 hours, has pain interfered with the following? General activity 3 Relation with others 3 Enjoyment of life 3 What TIME of day is your pain at its worst? daytime and evening Sleep (in general) Poor  Pain is worse with: walking, bending, and standing Pain improves with: rest and medication Relief from Meds: 5  Family History  Problem Relation Age of Onset   Cancer Father        Hodgkin's disease   COPD Mother    Heart attack Maternal Grandfather 80   Diabetes Neg Hx    Stroke Neg Hx    Hypertension Neg Hx    Hyperlipidemia Neg Hx    Social History   Socioeconomic History   Marital status: Married    Spouse name: Manuela Schwartz   Number of children: 2   Years of education: High school   Highest education level: Not on file  Occupational History   Occupation: burial Occupational hygienist: [employer]    Comment: [employer]  Tobacco Use   Smoking status: Former    Packs/day: 1.00    Years: 14.00    Pack years: 14.00    Types: Cigarettes    Quit date: 07/27/1999    Years since quitting: 21.7   Smokeless tobacco: Never  Vaping Use   Vaping Use: Never used  Substance and Sexual Activity   Alcohol use: Not Currently    Alcohol/week: 24.0 standard drinks    Types: 24 Cans of beer per week    Comment: 4-5 beers per day, case of beer per week   Drug use: No   Sexual activity: Yes     Birth control/protection: Post-menopausal  Other Topics Concern   Not on file  Social History Narrative   02/05/19   From: the area   Living: Living with wife Manuela Schwartz   Work: Dealer      Family: 2 children - [name] and [name] - liver nearby, no grandkids      Enjoys: float down the river, race track, camping      Exercise: just keeping busy at work   Diet: mostly meat and potatoes      Safety   Seat belts: Yes    Guns: Yes  and secure   Safe in relationships: Yes    Social Determinants of Health   Financial Resource Strain: Not on file  Food Insecurity: Not on file  Transportation Needs: Not on file  Physical Activity: Not on file  Stress: Not on file  Social Connections: Not on file   Past Surgical History:  Procedure Laterality Date   CARPAL TUNNEL RELEASE Bilateral 2009   CATARACT EXTRACTION W/PHACO Left 12/17/2019   Procedure: CATARACT EXTRACTION PHACO AND INTRAOCULAR LENS PLACEMENT ([place]) LEFT;  Surgeon: Eulogio Bear, MD;  Location: [place];  Service: Ophthalmology;  Laterality: Left;  3.61 0:26.9   CATARACT EXTRACTION W/PHACO Right 03/17/2020   Procedure: CATARACT EXTRACTION PHACO AND INTRAOCULAR LENS PLACEMENT (IOC) RIGHT 4.41  00:28.1;  Surgeon: Eulogio Bear, MD;  Location: [place];  Service: Ophthalmology;  Laterality: Right;   COLONOSCOPY W/ POLYPECTOMY     IR FLUORO GUIDED NEEDLE PLC ASPIRATION/INJECTION LOC  04/10/2020   JOINT REPLACEMENT     LACERATION REPAIR Right ~ 2012   leg   LUMBAR LAMINECTOMY/DECOMPRESSION MICRODISCECTOMY N/A 07/24/2019   Procedure: Decompressive Lumbar Laminectomy Lumbar three-four Lumbar four-five for Epidural Abscess;  Surgeon: Kary Kos, MD;  Location: [place];  Service: Neurosurgery;  Laterality: N/A;   TOTAL HIP ARTHROPLASTY Right 01/11/2013   Procedure: TOTAL HIP ARTHROPLASTY;  Surgeon: Garald Balding, MD;  Location: [place];  Service: Orthopedics;  Laterality: Right;   TOTAL HIP ARTHROPLASTY  Left 10/12/2016   Procedure: TOTAL HIP ARTHROPLASTY;  Surgeon: Garald Balding, MD;  Location: [place];  Service: Orthopedics;  Laterality: Left;   TOTAL HIP ARTHROPLASTY Left 04/11/2020   Procedure: LEFT HIP IRRIGATION AND DEBRIDEMENT WITH HEAD AND POLY SWAP;  Surgeon: Leandrew Koyanagi, MD;  Location: [place];  Service: Orthopedics;  Laterality: Left;   TOTAL HIP REVISION Left 06/21/2020   Procedure: LEFT TOTAL HIP REVISION;  Surgeon: Leandrew Koyanagi, MD;  Location: [place];  Service: Orthopedics;  Laterality: Left;   Past Surgical History:  Procedure Laterality Date   CARPAL TUNNEL RELEASE Bilateral 2009   CATARACT EXTRACTION W/PHACO Left 12/17/2019   Procedure: CATARACT EXTRACTION PHACO AND INTRAOCULAR LENS PLACEMENT ([place]) LEFT;  Surgeon: Eulogio Bear, MD;  Location: [place];  Service: Ophthalmology;  Laterality: Left;  3.61 0:26.9   CATARACT EXTRACTION W/PHACO Right 03/17/2020   Procedure: CATARACT EXTRACTION PHACO AND INTRAOCULAR LENS PLACEMENT (IOC) RIGHT 4.41  00:28.1;  Surgeon: Eulogio Bear, MD;  Location: [place];  Service: Ophthalmology;  Laterality: Right;   COLONOSCOPY W/ POLYPECTOMY     IR FLUORO GUIDED NEEDLE PLC ASPIRATION/INJECTION LOC  04/10/2020   JOINT REPLACEMENT     LACERATION REPAIR Right ~ 2012   leg   LUMBAR LAMINECTOMY/DECOMPRESSION MICRODISCECTOMY N/A 07/24/2019   Procedure: Decompressive Lumbar Laminectomy Lumbar three-four Lumbar four-five for Epidural Abscess;  Surgeon: Kary Kos, MD;  Location: [place];  Service: Neurosurgery;  Laterality: N/A;   TOTAL HIP ARTHROPLASTY Right 01/11/2013   Procedure: TOTAL HIP ARTHROPLASTY;  Surgeon: Garald Balding, MD;  Location: [place];  Service: Orthopedics;  Laterality: Right;   TOTAL HIP ARTHROPLASTY Left 10/12/2016   Procedure: TOTAL HIP ARTHROPLASTY;  Surgeon: Garald Balding, MD;  Location: [place];  Service: Orthopedics;  Laterality: Left;   TOTAL HIP ARTHROPLASTY Left 04/11/2020   Procedure: LEFT  HIP IRRIGATION AND DEBRIDEMENT WITH HEAD AND POLY SWAP;  Surgeon: Leandrew Koyanagi, MD;  Location: [place];  Service: Orthopedics;  Laterality: Left;   TOTAL HIP REVISION Left 06/21/2020   Procedure: LEFT TOTAL HIP REVISION;  Surgeon: Leandrew Koyanagi, MD;  Location: [place];  Service: Orthopedics;  Laterality: Left;   Past Medical History:  Diagnosis Date   Cauda equina syndrome ([place]) 07/31/2019   Cerebral septic emboli ([place]) 07/30/2019   H/O MSSA epidural abscess, L2-L5 07/25/2019   History of chicken pox    Medical history non-contributory    BP 135/84   Pulse 76   Temp 98.2 F (36.8 C) (Oral)   Ht 6' (1.829 m)   Wt 261 lb 3.2  oz (118.5 kg)   SpO2 98%   BMI 35.43 kg/m   Opioid Risk Score:   Fall Risk Score:  `1  Depression screen PHQ 2/9  Depression screen [facility] 2/9 02/19/2021 02/05/2021 01/08/2021 12/31/2020 12/16/2020 10/21/2020 05/20/2020  Decreased Interest 0 0 2 0 0 0 0  Down, Depressed, Hopeless 0 0 0 0 0 0 0  PHQ - 2 Score 0 0 2 0 0 0 0  Altered sleeping - - 3 - - - -  Tired, decreased energy - - 0 - - - -  Change in appetite - - 0 - - - -  Feeling bad or failure about yourself  - - 0 - - - -  Trouble concentrating - - 0 - - - -  Moving slowly or fidgety/restless - - 0 - - - -  Suicidal thoughts - - 0 - - - -  PHQ-9 Score - - 5 - - - -  Difficult doing work/chores - - - - - - -     Review of Systems  Musculoskeletal:  Positive for back pain.       Left hip pain  All other systems reviewed and are negative.     Objective:   Physical Exam Vitals and nursing note reviewed.  Constitutional:      Appearance: Normal appearance.  Cardiovascular:     Rate and Rhythm: Normal rate and regular rhythm.     Pulses: Normal pulses.     Heart sounds: Normal heart sounds.  Pulmonary:     Effort: Pulmonary effort is normal.     Breath sounds: Normal breath sounds.  Musculoskeletal:     Cervical back: Normal range of motion.     Comments: Normal Muscle Bulk and Muscle Testing Reveals:   Upper Extremities: Full ROM and Muscle Strength 5/5 Lumbar Hypersensitivity Lower Extremities: Full ROM and Muscle Strength 5/5 Left Lower Extremity Flexion Produces Pain into his Left Hip Arises from Table slowly Narrow Based  Gait     Skin:    General: Skin is warm and dry.  Neurological:     Mental Status: He is alert and oriented to person, place, and time.  Psychiatric:        Mood and Affect: Mood normal.        Behavior: Behavior normal.         Assessment & Plan:  S/P Lumbar Laminectomy: Continue HEP as Tolerated. Continue to Monitor. 04/30/2021 Chromic Pain Syndrome: Refilled: Oxycodone 5/325 mg one tablet twice a day as needed for pain #55 We will continue the opioid monitoring program, this consists of regular clinic visits, examinations, urine drug screen, pill counts as well as use of [place] Controlled Substance Reporting system. A 12 month History has been reviewed on the [place] on 04/30/2021     F/U in 1 month

## 2021-05-06 ENCOUNTER — Encounter: Payer: Self-pay | Admitting: Registered Nurse

## 2021-05-08 ENCOUNTER — Ambulatory Visit: Payer: BC Managed Care – PPO | Admitting: Physical Medicine and Rehabilitation

## 2021-05-29 ENCOUNTER — Encounter: Payer: 59 | Attending: Physical Medicine and Rehabilitation | Admitting: Registered Nurse

## 2021-05-29 ENCOUNTER — Encounter: Payer: Self-pay | Admitting: Registered Nurse

## 2021-05-29 ENCOUNTER — Other Ambulatory Visit: Payer: Self-pay

## 2021-05-29 VITALS — BP 119/79 | HR 71 | Ht 72.0 in | Wt 266.4 lb

## 2021-05-29 DIAGNOSIS — Z9889 Other specified postprocedural states: Secondary | ICD-10-CM | POA: Diagnosis present

## 2021-05-29 DIAGNOSIS — G894 Chronic pain syndrome: Secondary | ICD-10-CM | POA: Insufficient documentation

## 2021-05-29 DIAGNOSIS — M7062 Trochanteric bursitis, left hip: Secondary | ICD-10-CM | POA: Diagnosis present

## 2021-05-29 DIAGNOSIS — Z79899 Other long term (current) drug therapy: Secondary | ICD-10-CM | POA: Insufficient documentation

## 2021-05-29 DIAGNOSIS — M5416 Radiculopathy, lumbar region: Secondary | ICD-10-CM | POA: Diagnosis not present

## 2021-05-29 DIAGNOSIS — Z5181 Encounter for therapeutic drug level monitoring: Secondary | ICD-10-CM | POA: Insufficient documentation

## 2021-05-29 MED ORDER — OXYCODONE-ACETAMINOPHEN 5-325 MG PO TABS: 1.0000 | ORAL_TABLET | Freq: Two times a day (BID) | ORAL | 0 refills | Status: DC | PRN

## 2021-05-29 NOTE — Progress Notes (Signed)
Subjective:    Patient ID: [name], male    DOB: 1965-10-25, 55 y.o.   MRN: [id number]  HPI: [name] is a 55 y.o. male who returns for follow up appointment for chronic pain and medication refill. He  states his pain is located in his lower back radiating into his left hip and left lower extremity. He rates his pain 5. His current exercise regime is walking and performing stretching exercises.  [name] Morphine equivalent is 15.00 MME.   UDS ordered today.     Pain Inventory Average Pain 6 Pain Right Now 5 My pain is constant, sharp, burning, and stabbing  In the last 24 hours, has pain interfered with the following? General activity 5 Relation with others 6 Enjoyment of life 7 What TIME of day is your pain at its worst? morning , daytime, and evening Sleep (in general) Poor  Pain is worse with: walking, bending, and standing Pain improves with: rest and medication Relief from Meds: 6  Family History  Problem Relation Age of Onset   Cancer Father        Hodgkin's disease   COPD Mother    Heart attack Maternal Grandfather 80   Diabetes Neg Hx    Stroke Neg Hx    Hypertension Neg Hx    Hyperlipidemia Neg Hx    Social History   Socioeconomic History   Marital status: Married    Spouse name: Manuela Schwartz   Number of children: 2   Years of education: High school   Highest education level: Not on file  Occupational History   Occupation: burial Occupational hygienist: Doctor, hospital    Comment: [employer]  Tobacco Use   Smoking status: Former    Packs/day: 1.00    Years: 14.00    Pack years: 14.00    Types: Cigarettes    Quit date: 07/27/1999    Years since quitting: 21.8   Smokeless tobacco: Never  Vaping Use   Vaping Use: Never used  Substance and Sexual Activity   Alcohol use: Not Currently    Alcohol/week: 24.0 standard drinks    Types: 24 Cans of beer per week    Comment: 4-5 beers per day, case of beer per week   Drug use: No   Sexual activity: Yes     Birth control/protection: Post-menopausal  Other Topics Concern   Not on file  Social History Narrative   02/05/19   From: the area   Living: Living with wife Manuela Schwartz   Work: Dealer      Family: 2 children - [name] and [name] - liver nearby, no grandkids      Enjoys: float down the river, race track, camping      Exercise: just keeping busy at work   Diet: mostly meat and potatoes      Safety   Seat belts: Yes    Guns: Yes  and secure   Safe in relationships: Yes    Social Determinants of Health   Financial Resource Strain: Not on file  Food Insecurity: Not on file  Transportation Needs: Not on file  Physical Activity: Not on file  Stress: Not on file  Social Connections: Not on file   Past Surgical History:  Procedure Laterality Date   CARPAL TUNNEL RELEASE Bilateral 2009   CATARACT EXTRACTION W/PHACO Left 12/17/2019   Procedure: CATARACT EXTRACTION PHACO AND INTRAOCULAR LENS PLACEMENT ([place]) LEFT;  Surgeon: Eulogio Bear, MD;  Location: [place];  Service: Ophthalmology;  Laterality: Left;  3.61 0:26.9   CATARACT EXTRACTION W/PHACO Right 03/17/2020   Procedure: CATARACT EXTRACTION PHACO AND INTRAOCULAR LENS PLACEMENT ([facility]) RIGHT 4.41  00:28.1;  Surgeon: Eulogio Bear, MD;  Location: [place];  Service: Ophthalmology;  Laterality: Right;   COLONOSCOPY W/ POLYPECTOMY     IR FLUORO GUIDED NEEDLE PLC ASPIRATION/INJECTION LOC  04/10/2020   JOINT REPLACEMENT     LACERATION REPAIR Right ~ 2012   leg   LUMBAR LAMINECTOMY/DECOMPRESSION MICRODISCECTOMY N/A 07/24/2019   Procedure: Decompressive Lumbar Laminectomy Lumbar three-four Lumbar four-five for Epidural Abscess;  Surgeon: Kary Kos, MD;  Location: [place];  Service: Neurosurgery;  Laterality: N/A;   TOTAL HIP ARTHROPLASTY Right 01/11/2013   Procedure: TOTAL HIP ARTHROPLASTY;  Surgeon: Garald Balding, MD;  Location: [place];  Service: Orthopedics;  Laterality: Right;   TOTAL HIP ARTHROPLASTY  Left 10/12/2016   Procedure: TOTAL HIP ARTHROPLASTY;  Surgeon: Garald Balding, MD;  Location: [place];  Service: Orthopedics;  Laterality: Left;   TOTAL HIP ARTHROPLASTY Left 04/11/2020   Procedure: LEFT HIP IRRIGATION AND DEBRIDEMENT WITH HEAD AND POLY SWAP;  Surgeon: Leandrew Koyanagi, MD;  Location: [place];  Service: Orthopedics;  Laterality: Left;   TOTAL HIP REVISION Left 06/21/2020   Procedure: LEFT TOTAL HIP REVISION;  Surgeon: Leandrew Koyanagi, MD;  Location: [place];  Service: Orthopedics;  Laterality: Left;   Past Surgical History:  Procedure Laterality Date   CARPAL TUNNEL RELEASE Bilateral 2009   CATARACT EXTRACTION W/PHACO Left 12/17/2019   Procedure: CATARACT EXTRACTION PHACO AND INTRAOCULAR LENS PLACEMENT ([place]) LEFT;  Surgeon: Eulogio Bear, MD;  Location: [place];  Service: Ophthalmology;  Laterality: Left;  3.61 0:26.9   CATARACT EXTRACTION W/PHACO Right 03/17/2020   Procedure: CATARACT EXTRACTION PHACO AND INTRAOCULAR LENS PLACEMENT ([facility]) RIGHT 4.41  00:28.1;  Surgeon: Eulogio Bear, MD;  Location: [place];  Service: Ophthalmology;  Laterality: Right;   COLONOSCOPY W/ POLYPECTOMY     IR FLUORO GUIDED NEEDLE PLC ASPIRATION/INJECTION LOC  04/10/2020   JOINT REPLACEMENT     LACERATION REPAIR Right ~ 2012   leg   LUMBAR LAMINECTOMY/DECOMPRESSION MICRODISCECTOMY N/A 07/24/2019   Procedure: Decompressive Lumbar Laminectomy Lumbar three-four Lumbar four-five for Epidural Abscess;  Surgeon: Kary Kos, MD;  Location: [place];  Service: Neurosurgery;  Laterality: N/A;   TOTAL HIP ARTHROPLASTY Right 01/11/2013   Procedure: TOTAL HIP ARTHROPLASTY;  Surgeon: Garald Balding, MD;  Location: [place];  Service: Orthopedics;  Laterality: Right;   TOTAL HIP ARTHROPLASTY Left 10/12/2016   Procedure: TOTAL HIP ARTHROPLASTY;  Surgeon: Garald Balding, MD;  Location: [place];  Service: Orthopedics;  Laterality: Left;   TOTAL HIP ARTHROPLASTY Left 04/11/2020   Procedure: LEFT  HIP IRRIGATION AND DEBRIDEMENT WITH HEAD AND POLY SWAP;  Surgeon: Leandrew Koyanagi, MD;  Location: [place];  Service: Orthopedics;  Laterality: Left;   TOTAL HIP REVISION Left 06/21/2020   Procedure: LEFT TOTAL HIP REVISION;  Surgeon: Leandrew Koyanagi, MD;  Location: [place];  Service: Orthopedics;  Laterality: Left;   Past Medical History:  Diagnosis Date   Cauda equina syndrome ([place]) 07/31/2019   Cerebral septic emboli ([place]) 07/30/2019   H/O MSSA epidural abscess, L2-L5 07/25/2019   History of chicken pox    Medical history non-contributory    Ht 6' (1.829 m)   Wt 266 lb 6.4 oz (120.8 kg)   BMI 36.13 kg/m   Opioid Risk Score:   Fall  Risk Score:  `1  Depression screen PHQ 2/9  Depression screen [facility] 2/9 05/29/2021 02/19/2021 02/05/2021 01/08/2021 12/31/2020 12/16/2020 10/21/2020  Decreased Interest 0 0 0 2 0 0 0  Down, Depressed, Hopeless 0 0 0 0 0 0 0  PHQ - 2 Score 0 0 0 2 0 0 0  Altered sleeping - - - 3 - - -  Tired, decreased energy - - - 0 - - -  Change in appetite - - - 0 - - -  Feeling bad or failure about yourself  - - - 0 - - -  Trouble concentrating - - - 0 - - -  Moving slowly or fidgety/restless - - - 0 - - -  Suicidal thoughts - - - 0 - - -  PHQ-9 Score - - - 5 - - -  Difficult doing work/chores - - - - - - -     Review of Systems  Constitutional: Negative.   HENT: Negative.    Eyes: Negative.   Respiratory: Negative.    Cardiovascular: Negative.   Gastrointestinal: Negative.   Endocrine: Negative.   Genitourinary: Negative.   Musculoskeletal:  Positive for back pain and gait problem.  Skin: Negative.   Allergic/Immunologic: Negative.   Neurological:  Positive for dizziness and weakness.  Hematological: Negative.   Psychiatric/Behavioral: Negative.        Objective:   Physical Exam Vitals and nursing note reviewed.  Constitutional:      Appearance: Normal appearance.  Cardiovascular:     Rate and Rhythm: Normal rate and regular rhythm.     Pulses: Normal pulses.      Heart sounds: Normal heart sounds.  Pulmonary:     Effort: Pulmonary effort is normal.     Breath sounds: Normal breath sounds.  Musculoskeletal:     Cervical back: Normal range of motion and neck supple.     Comments: Normal Muscle Bulk and Muscle Testing Reveals:  Upper Extremities: Upper Extremities: Full ROM and Muscle Strength 5/5  Lumbar Paraspinal Tenderness: L-3-L-5 Mainly Left Side  Left Greater Trochanter Tenderness Lower Extremities:Right: Full ROM and Muscle Strength 5/5 Left Lower Extremity: Decreased ROM and Muscle Strength 5/5 Left Lower Extremity Flexion Produces pain into his Left Hip and Left Lower Extremity  Arises from Table slowly Narrow Based Gait     Skin:    General: Skin is warm and dry.  Neurological:     Mental Status: He is alert and oriented to person, place, and time.  Psychiatric:        Mood and Affect: Mood normal.        Behavior: Behavior normal.         Assessment & Plan:  S/P Lumbar Laminectomy: Continue HEP as Tolerated. Continue to Monitor. 05/29/2021 Chromic Pain Syndrome: Refilled: Oxycodone 5/325 mg one tablet twice a day as needed for pain #55 We will continue the opioid monitoring program, this consists of regular clinic visits, examinations, urine drug screen, pill counts as well as use of [place] Controlled Substance Reporting system. A 12 month History has been reviewed on the [place] Controlled Substance Reporting System on 05/29/2021 3. Left greater Trochanter Tenderness: Continue to alternate Ice and Heat Therapy. Continue to monitor.     F/U in 1 month

## 2021-06-02 ENCOUNTER — Ambulatory Visit: Payer: BC Managed Care – PPO | Admitting: Registered Nurse

## 2021-06-05 LAB — TOXASSURE SELECT,+ANTIDEPR,UR

## 2021-06-10 ENCOUNTER — Telehealth: Payer: Self-pay | Admitting: *Deleted

## 2021-06-10 NOTE — Telephone Encounter (Signed)
Urine drug screen for this encounter is consistent for prescribed medication 

## 2021-06-30 ENCOUNTER — Other Ambulatory Visit: Payer: Self-pay

## 2021-06-30 ENCOUNTER — Encounter: Payer: 59 | Attending: Physical Medicine and Rehabilitation | Admitting: Physical Medicine and Rehabilitation

## 2021-06-30 ENCOUNTER — Encounter: Payer: Self-pay | Admitting: Physical Medicine and Rehabilitation

## 2021-06-30 VITALS — BP 147/88 | HR 85 | Temp 98.5°F | Ht 72.0 in | Wt 264.4 lb

## 2021-06-30 DIAGNOSIS — G894 Chronic pain syndrome: Secondary | ICD-10-CM | POA: Diagnosis not present

## 2021-06-30 DIAGNOSIS — R454 Irritability and anger: Secondary | ICD-10-CM | POA: Insufficient documentation

## 2021-06-30 MED ORDER — OXYCODONE-ACETAMINOPHEN 5-325 MG PO TABS: 1.0000 | ORAL_TABLET | Freq: Two times a day (BID) | ORAL | 0 refills | Status: DC | PRN

## 2021-06-30 NOTE — Progress Notes (Signed)
Subjective:    Patient ID: [name], male    DOB: 10/08/1965, 55 y.o.   MRN: [id number]  HPI Mr. Petropoulos is a 55 year old man who presents with left sided low back pain. He had an MRI lumbar by infectious disease that shows an L2-L3 infectious process. The pain is present all day and all night. The more he does the more he hurts. He knows he needs to follow-up with neurosurgery but needed to get the money for his appointment. Average pain is 4/10. Currently taking a cocktail of tramadol, meloxicam, flexeril, and applying a cold rub- all of which help but minimally. 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   He is willing to stop drinking and would like to take the hydrocodone three time per day. He tried over the counter Lidocaine 4% patch without much relief. He has tried BioFreeze and it helps while he uses it, does not help when he stops. He has never tried Cymbalta and is agreeable to trying. He notes he is short and irritated with people when he doesn't mean to be.   His pain continues to be severe Relaxing helps He has a Motor cycle and has not been able to ride it for 2 years The medication helps but he only for 2-3 hours. He is not sure if he wants to try a higher dose  The medicine does help him function better but it sometimes causes him to do too much Dr. Saintclair Halsted had mentioned injections to him in September but they were not covered by injections- they cost 700-800 dollars.   Pain Inventory Average Pain 7 Pain Right Now 7 My pain is sharp, burning, and stabbing  In the last 24 hours, has pain interfered with the following? General activity 5 Relation with others 4 Enjoyment of life 4 What TIME of day is your pain at its worst? Morning, daytime, evening, and night Sleep (in general) Poor  Pain is worse with: walking, bending, standing, some activities Pain improves with: rest, heat/ice, medication Relief from Meds: 5      Family History  Problem Relation  Age of Onset   Cancer Father        Hodgkin's disease   COPD Mother    Heart attack Maternal Grandfather 80   Diabetes Neg Hx    Stroke Neg Hx    Hypertension Neg Hx    Hyperlipidemia Neg Hx    Social History   Socioeconomic History   Marital status: Married    Spouse name: Manuela Schwartz   Number of children: 2   Years of education: High school   Highest education level: Not on file  Occupational History   Occupation: burial Occupational hygienist: Doctor, hospital    Comment: [employer]  Tobacco Use   Smoking status: Former    Packs/day: 1.00    Years: 14.00    Pack years: 14.00    Types: Cigarettes    Quit date: 07/27/1999    Years since quitting: 21.9   Smokeless tobacco: Never  Vaping Use   Vaping Use: Never used  Substance and Sexual Activity   Alcohol use: Not Currently    Alcohol/week: 24.0 standard drinks    Types: 24 Cans of beer per week    Comment: 4-5 beers per day, case of beer per week   Drug use: No   Sexual activity: Yes    Birth control/protection: Post-menopausal  Other Topics Concern   Not  on file  Social History Narrative   02/05/19   From: the area   Living: Living with wife Manuela Schwartz   Work: Dealer      Family: 2 children - [name] and [name] - liver nearby, no grandkids      Enjoys: float down the river, race track, camping      Exercise: just keeping busy at work   Diet: mostly meat and potatoes      Safety   Seat belts: Yes    Guns: Yes  and secure   Safe in relationships: Yes    Social Determinants of Health   Financial Resource Strain: Not on file  Food Insecurity: Not on file  Transportation Needs: Not on file  Physical Activity: Not on file  Stress: Not on file  Social Connections: Not on file   Past Surgical History:  Procedure Laterality Date   CARPAL TUNNEL RELEASE Bilateral 2009   CATARACT EXTRACTION W/PHACO Left 12/17/2019   Procedure: [place] ([place]) LEFT;  Surgeon: Eulogio Bear,  MD;  Location: [place];  Service: Ophthalmology;  Laterality: Left;  3.61 0:26.9   CATARACT EXTRACTION W/PHACO Right 03/17/2020   Procedure: CATARACT EXTRACTION PHACO AND INTRAOCULAR LENS PLACEMENT ([facility]) RIGHT 4.41  00:28.1;  Surgeon: Eulogio Bear, MD;  Location: [place];  Service: Ophthalmology;  Laterality: Right;   COLONOSCOPY W/ POLYPECTOMY     IR FLUORO GUIDED NEEDLE PLC ASPIRATION/INJECTION LOC  04/10/2020   JOINT REPLACEMENT     LACERATION REPAIR Right ~ 2012   leg   LUMBAR LAMINECTOMY/DECOMPRESSION MICRODISCECTOMY N/A 07/24/2019   Procedure: Decompressive Lumbar Laminectomy Lumbar three-four Lumbar four-five for Epidural Abscess;  Surgeon: Kary Kos, MD;  Location: [place];  Service: Neurosurgery;  Laterality: N/A;   TOTAL HIP ARTHROPLASTY Right 01/11/2013   Procedure: TOTAL HIP ARTHROPLASTY;  Surgeon: Garald Balding, MD;  Location: [place];  Service: Orthopedics;  Laterality: Right;   TOTAL HIP ARTHROPLASTY Left 10/12/2016   Procedure: TOTAL HIP ARTHROPLASTY;  Surgeon: Garald Balding, MD;  Location: [place];  Service: Orthopedics;  Laterality: Left;   TOTAL HIP ARTHROPLASTY Left 04/11/2020   Procedure: LEFT HIP IRRIGATION AND DEBRIDEMENT WITH HEAD AND POLY SWAP;  Surgeon: Leandrew Koyanagi, MD;  Location: [place];  Service: Orthopedics;  Laterality: Left;   TOTAL HIP REVISION Left 06/21/2020   Procedure: LEFT TOTAL HIP REVISION;  Surgeon: Leandrew Koyanagi, MD;  Location: [place];  Service: Orthopedics;  Laterality: Left;   Past Medical History:  Diagnosis Date   Cauda equina syndrome ([place]) 07/31/2019   Cerebral septic emboli ([place]) 07/30/2019   H/O MSSA epidural abscess, L2-L5 07/25/2019   History of chicken pox    Medical history non-contributory    There were no vitals taken for this visit.  Opioid Risk Score:   Fall Risk Score:  `1  Depression screen PHQ 2/9  Depression screen [facility] 2/9 05/29/2021 02/19/2021 02/05/2021 01/08/2021 12/31/2020 12/16/2020 10/21/2020   Decreased Interest 0 0 0 2 0 0 0  Down, Depressed, Hopeless 0 0 0 0 0 0 0  PHQ - 2 Score 0 0 0 2 0 0 0  Altered sleeping - - - 3 - - -  Tired, decreased energy - - - 0 - - -  Change in appetite - - - 0 - - -  Feeling bad or failure about yourself  - - - 0 - - -  Trouble concentrating - - - 0 - - -  Moving slowly or fidgety/restless - - - 0 - - -  Suicidal thoughts - - - 0 - - -  PHQ-9 Score - - - 5 - - -  Difficult doing work/chores - - - - - - -      Review of Systems  Constitutional: Negative.   HENT: Negative.    Eyes: Negative.   Respiratory: Negative.    Cardiovascular: Negative.   Gastrointestinal: Negative.   Endocrine: Negative.   Genitourinary: Negative.   Musculoskeletal:  Positive for back pain and gait problem.       Spasms  Skin: Negative.   Allergic/Immunologic: Negative.   Hematological: Negative.   Psychiatric/Behavioral: Negative.        Objective:   Physical Exam Gen: no distress, normal appearing, BMI 35.86, BP 147/88 HEENT: oral mucosa pink and moist, NCAT Cardio: Reg rate Chest: normal effort, normal rate of breathing Abd: abdominal obesity Ext: no edema Psych: pleasant, normal affect Skin: intact Neuro: Alert and oriented x3 Musculoskeletal: Antalgic gait, disc bulge left L2-L3 palpable and tender with even light palpation.      Assessment & Plan:  1) Chronic Pain Syndrome secondary to L2-L3 osteophyte w/ infectious process, failed multiple surgeries.  -Urine sample and pain contract obtained. PDMP reviewed. Prescribed Percocet 5mg  BID on 7/22. He will f/u with [facility] monthly next 3 months, then me in 4 months. Discussed risks and benefits of this medication. 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  -Discussed current symptoms of pain and history of pain.  -Discussed benefits of exercise in reducing pain. -Discussed increasing the frequency of his medication -Discussed following foods that may reduce pain: 1) Ginger  (especially studied for arthritis)- reduce leukotriene production to decrease inflammation 2) Blueberries- high in phytonutrients that decrease inflammation 3) Salmon- marine omega-3s reduce joint swelling and pain 4) Pumpkin seeds- reduce inflammation 5) dark chocolate- reduces inflammation 6) turmeric- reduces inflammation 7) tart cherries - reduce pain and stiffness 8) extra virgin olive oil - its compound olecanthal helps to block prostaglandins  9) chili peppers- can be eaten or applied topically via capsaicin 10) mint- helpful for headache, muscle aches, joint pain, and itching 11) garlic- reduces inflammation  Link to further information on diet for chronic pain: http://[email or web address].[email or web address]   2) Irritability: Stop Cymbalta as he did not have benefit.   3) Low testosterone -discussed that this could contribute to his low energy -discussed low carb and high protein diet.  -discussed that intermittent fasting can help.  -discussed not to eat when he is not hungry.  -recommended oysters once per week

## 2021-06-30 NOTE — Patient Instructions (Signed)

## 2021-07-07 ENCOUNTER — Encounter: Payer: Self-pay | Admitting: Internal Medicine

## 2021-07-07 ENCOUNTER — Other Ambulatory Visit: Payer: Self-pay

## 2021-07-07 ENCOUNTER — Ambulatory Visit (INDEPENDENT_AMBULATORY_CARE_PROVIDER_SITE_OTHER): Payer: 59 | Admitting: Internal Medicine

## 2021-07-07 DIAGNOSIS — G061 Intraspinal abscess and granuloma: Secondary | ICD-10-CM

## 2021-07-07 DIAGNOSIS — R197 Diarrhea, unspecified: Secondary | ICD-10-CM

## 2021-07-07 NOTE — Assessment & Plan Note (Signed)
He will bring a stool specimen and so we can check for C. difficile.

## 2021-07-07 NOTE — Progress Notes (Signed)
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 ([place]) 06/20/2020   Adjustment disorder with depressed mood 11/19/2019   Left hip pain 10/09/2019   Blurry vision, left eye 10/09/2019   Hyponatremia 08/20/2019   S/P lumbar laminectomy 07/25/2019   Anemia 07/18/2019   Obesity (BMI 35.0-39.9 without comorbidity) 07/18/2019   Constipation 07/18/2019   Elevated blood sugar 04/01/2017   Alcohol abuse, in remission 10/12/2016   S/P total hip arthroplasty 10/12/2016    Patient's Medications  New Prescriptions   No medications on file  Previous Medications   ASPIRIN EC 81 MG TABLET    Take 1 tablet (81 mg total) by mouth 2 (two) times daily as needed.   CA CARBONATE-MAG HYDROXIDE (ROLAIDS PO)    Take 1 tablet by mouth daily as needed (heartburn).   CYCLOBENZAPRINE (FLEXERIL) 10 MG TABLET    TAKE 1 TABLET BY MOUTH THREE TIMES A DAY AS NEEDED   CYCLOBENZAPRINE (FLEXERIL) 5 MG TABLET       DICLOFENAC (VOLTAREN) 75 MG EC TABLET    Take 1 tablet (75 mg total) by mouth 2 (two) times daily as needed.   DULOXETINE (CYMBALTA) 20 MG CAPSULE    TAKE 1 CAPSULE BY MOUTH EVERY DAY   ESCITALOPRAM (LEXAPRO) 20 MG TABLET    Take 20 mg by mouth daily.   IBUPROFEN (ADVIL) 200 MG TABLET       MELOXICAM (MOBIC) 7.5 MG TABLET    1 tab po daily prn pain   METHOCARBAMOL (ROBAXIN) 750 MG TABLET    Take 1 tablet (750 mg total) by mouth 2 (two) times daily as needed for muscle spasms.    OXYCODONE-ACETAMINOPHEN (PERCOCET) 5-325 MG TABLET    Take 1 tablet by mouth 2 (two) times daily as needed for moderate pain.   PREDNISONE (DELTASONE) 5 MG TABLET      Modified Medications   No medications on file  Discontinued Medications   CEPHALEXIN (KEFLEX) 500 MG CAPSULE    Take 1 capsule (500 mg total) by mouth 2 (two) times daily.   RIFAMPIN (RIFADIN) 300 MG CAPSULE    Take by mouth.    Subjective: [name] is in for his routine follow-up visit.  He had MSSA bacteremia and lumbar infection in December of 2020.  He has a prosthetic left hip.  Hip aspirate Gram stain and cultures were negative.  He underwent lumbar decompression and laminectomy followed by 54 days of IV antibiotic therapy completing treatment on 09/17/2019. His infection appeared to have been cured. He did well and was slowly improving until he woke up on the morning of 04/05/2020 with severe left hip pain. He underwent MRI scan which showed a left acetabular osteomyelitis, adjacent myositis and a small iliac is abscess. Repeat blood cultures grew MSSA again.    He underwent incision and drainage of his left hip.  PICC was  placed and he was discharged on IV cefazolin and oral rifampin.  He completed 6 weeks of IV therapy in October before switching to oral cephalexin and rifampin.     He dislocated his left hip requiring repeat surgery in late November.  He underwent incision and drainage with repeat polyexchange.  The operative note indicated there was a seroma but otherwise no evidence of infection.  No specimens were submitted for stain or culture.    Earlier this year he developed acute left lower back pain again.  He did not have any obvious injury prior to the pain getting worse.  He says that he now has difficulty differentiating between his left lower back and left hip pain.  The pain is 4-5 out of 10 at rest but gets much worse with any exertion.  He feels like he has some new swelling in his lower back. He underwent a lumbar  MRI on 12/24/2020 which showed:  IMPRESSION: 1. Focal inflammatory changes at a bulky L2-3 left far-lateral osteophyte which could be degenerative or infectious. Given the prior findings, infection seems more likely. No abscess. 2. Lumbar spine degeneration without high-grade stenosis.  He followed-up with his neurosurgeon, Dr. Kary Kos.  He does not recall him mentioning the MRI findings but he did say he wanted give him some epidural injection but his medical insurance would not cover it.  Shortly after seeing Dr. Saintclair Halsted he made a decision on his own to stop taking his antibiotics.  He estimates that he has been off of his antibiotics for at least 8 weeks.  He is still having some frequent soft stool.  He has not noted any change in his back or left hip pain.  Review of Systems: Review of Systems  Constitutional:  Negative for chills, diaphoresis and fever.  Gastrointestinal:  Positive for diarrhea. Negative for abdominal pain, nausea and vomiting.  Musculoskeletal:  Positive for back pain and joint pain.   Past Medical History:  Diagnosis Date   Cauda equina syndrome ([place]) 07/31/2019   Cerebral septic emboli ([place]) 07/30/2019   H/O MSSA epidural abscess, L2-L5 07/25/2019   History of chicken pox    Medical history non-contributory     Social History   Tobacco Use   Smoking status: Former    Packs/day: 1.00    Years: 14.00    Pack years: 14.00    Types: Cigarettes    Quit date: 07/27/1999    Years since quitting: 21.9   Smokeless tobacco: Never  Vaping Use   Vaping Use: Never used  Substance Use Topics   Alcohol use: Not Currently    Alcohol/week: 24.0 standard drinks    Types: 24 Cans of beer per week    Comment: 4-5 beers per day, case of beer per week   Drug use: No    Family History  Problem Relation Age of Onset   Cancer Father        Hodgkin's disease   COPD Mother    Heart attack Maternal Grandfather 80   Diabetes Neg Hx    Stroke Neg Hx    Hypertension Neg Hx     Hyperlipidemia Neg Hx     Allergies  Allergen Reactions   Bee Venom Anaphylaxis   Hydrocodone Nausea And Vomiting    (12/10/19 - pt says he has taken without issues)    Objective: Vitals:   07/07/21 1348  BP: 134/85  Pulse: 75  Temp: 98.1 F (36.7 C)  TempSrc: Oral  SpO2: 100%  Weight: 269 lb (122 kg)   Body mass index is 36.48 kg/m.  Physical Exam Constitutional:      Comments: He appears uncomfortable due to pain.  Cardiovascular:     Rate and Rhythm: Normal rate.  Pulmonary:     Effort: Pulmonary effort is normal.    Lab Results Sed Rate  Date Value  10/21/2020 14 mm/h  07/29/2020 11 mm/h  04/09/2020 78 mm/hr (H)   CRP  Date Value  10/21/2020 11.1 mg/L (H)  07/29/2020 3.6 mg/L  04/10/2020 20.5 mg/dL (H)      Problem List Items Addressed This Visit       High   Abscess in epidural space of lumbar spine    I am hopeful that his infection has been cured given that he has not had any increase in pain off of his antibiotics.  I will check repeat inflammatory markers today and see him back in 6 weeks.      Relevant Orders   C-reactive protein   Sedimentation rate     Unprioritized   Diarrhea    He will bring a stool specimen and so we can check for C. difficile.      Relevant Orders   Clostridium Difficile by PCR     Michel Bickers, MD [facility] [phone] pager   [phone] cell 07/07/2021, 2:06 PM

## 2021-07-07 NOTE — Assessment & Plan Note (Signed)
I am hopeful that his infection has been cured given that he has not had any increase in pain off of his antibiotics.  I will check repeat inflammatory markers today and see him back in 6 weeks.

## 2021-07-08 ENCOUNTER — Telehealth: Payer: Self-pay

## 2021-07-08 LAB — SEDIMENTATION RATE: Sed Rate: 28 mm/h — ABNORMAL HIGH (ref 0–20)

## 2021-07-08 LAB — C-REACTIVE PROTEIN: CRP: 29.2 mg/L — ABNORMAL HIGH (ref <8.0)

## 2021-07-08 NOTE — Telephone Encounter (Signed)
Myriam Jacobson with [facility] would like to know if patient will require premedication before dental treatment.  Cb# [phone].  Please advise.  Thank you.

## 2021-07-09 ENCOUNTER — Other Ambulatory Visit: Payer: Self-pay | Admitting: Physician Assistant

## 2021-07-09 MED ORDER — AMOXICILLIN 500 MG PO CAPS: ORAL_CAPSULE | ORAL | 2 refills | Status: DC

## 2021-07-09 NOTE — Telephone Encounter (Signed)
I would recommend for two years after this latest revision which was just over one year ago

## 2021-07-09 NOTE — Telephone Encounter (Signed)
Sent in

## 2021-07-09 NOTE — Telephone Encounter (Signed)
States he will need  to go 5 times to the dental office.

## 2021-07-10 ENCOUNTER — Other Ambulatory Visit: Payer: Self-pay | Admitting: Physician Assistant

## 2021-07-10 MED ORDER — AMOXICILLIN 500 MG PO CAPS: ORAL_CAPSULE | ORAL | 2 refills | Status: DC

## 2021-07-10 NOTE — Telephone Encounter (Signed)
Sent in

## 2021-07-10 NOTE — Telephone Encounter (Signed)
Lvm informing pt.

## 2021-07-28 ENCOUNTER — Other Ambulatory Visit: Payer: Self-pay

## 2021-07-28 ENCOUNTER — Encounter: Payer: 59 | Attending: Physical Medicine and Rehabilitation | Admitting: Registered Nurse

## 2021-07-28 ENCOUNTER — Encounter: Payer: Self-pay | Admitting: Registered Nurse

## 2021-07-28 VITALS — BP 119/76 | HR 72 | Temp 99.0°F | Ht 72.0 in | Wt 266.0 lb

## 2021-07-28 DIAGNOSIS — M5416 Radiculopathy, lumbar region: Secondary | ICD-10-CM | POA: Insufficient documentation

## 2021-07-28 DIAGNOSIS — Z5181 Encounter for therapeutic drug level monitoring: Secondary | ICD-10-CM | POA: Diagnosis present

## 2021-07-28 DIAGNOSIS — Z79899 Other long term (current) drug therapy: Secondary | ICD-10-CM | POA: Diagnosis present

## 2021-07-28 DIAGNOSIS — G894 Chronic pain syndrome: Secondary | ICD-10-CM | POA: Insufficient documentation

## 2021-07-28 MED ORDER — OXYCODONE-ACETAMINOPHEN 5-325 MG PO TABS: 1.0000 | ORAL_TABLET | Freq: Two times a day (BID) | ORAL | 0 refills | Status: DC | PRN

## 2021-07-28 NOTE — Progress Notes (Signed)
Subjective:    Patient ID: [name], male    DOB: 12/27/1965, 56 y.o.   MRN: [id number]  HPI: [name] is a 56 y.o. male who returns for follow up appointment for chronic pain and medication refill. He states his pain is located in his lower back radiating into his left hip. He rates his pain 7. His current exercise regime is walking and performing stretching exercises.  Mr. Huckeby Morphine equivalent is 15.00 MME.  Last UDS was [place] on 05/29/2021, it was consistent.       Pain Inventory Average Pain 6 Pain Right Now 7 My pain is constant, sharp, burning, and stabbing  In the last 24 hours, has pain interfered with the following? General activity 7 Relation with others 6 Enjoyment of life 4 What TIME of day is your pain at its worst? daytime and evening Sleep (in general) Poor  Pain is worse with: walking, bending, standing, and some activites Pain improves with: rest, heat/ice, and medication Relief from Meds: 4  Family History  Problem Relation Age of Onset   Cancer Father        Hodgkin's disease   COPD Mother    Heart attack Maternal Grandfather 80   Diabetes Neg Hx    Stroke Neg Hx    Hypertension Neg Hx    Hyperlipidemia Neg Hx    Social History   Socioeconomic History   Marital status: Married    Spouse name: Manuela Schwartz   Number of children: 2   Years of education: High school   Highest education level: Not on file  Occupational History   Occupation: burial Occupational hygienist: Doctor, hospital    Comment: [employer]  Tobacco Use   Smoking status: Former    Packs/day: 1.00    Years: 14.00    Pack years: 14.00    Types: Cigarettes    Quit date: 07/27/1999    Years since quitting: 22.0   Smokeless tobacco: Never  Vaping Use   Vaping Use: Never used  Substance and Sexual Activity   Alcohol use: Not Currently    Alcohol/week: 24.0 standard drinks    Types: 24 Cans of beer per week    Comment: 4-5 beers per day, case of beer per week   Drug use: No    Sexual activity: Yes    Birth control/protection: Post-menopausal  Other Topics Concern   Not on file  Social History Narrative   02/05/19   From: the area   Living: Living with wife Manuela Schwartz   Work: Dealer      Family: 2 children - [name] and [name] - liver nearby, no grandkids      Enjoys: float [place] the river, race track, camping      Exercise: just keeping busy at work   Diet: mostly meat and potatoes      Safety   Seat belts: Yes    Guns: Yes  and secure   Safe in relationships: Yes    Social Determinants of Health   Financial Resource Strain: Not on file  Food Insecurity: Not on file  Transportation Needs: Not on file  Physical Activity: Not on file  Stress: Not on file  Social Connections: Not on file   Past Surgical History:  Procedure Laterality Date   CARPAL TUNNEL RELEASE Bilateral 2009   CATARACT EXTRACTION W/PHACO Left 12/17/2019   Procedure: CATARACT EXTRACTION PHACO AND INTRAOCULAR LENS PLACEMENT ([place]) LEFT;  Surgeon: Eulogio Bear, MD;  Location:  [place];  Service: Ophthalmology;  Laterality: Left;  3.61 0:26.9   CATARACT EXTRACTION W/PHACO Right 03/17/2020   Procedure: CATARACT EXTRACTION PHACO AND INTRAOCULAR LENS PLACEMENT ([facility]) RIGHT 4.41  00:28.1;  Surgeon: Eulogio Bear, MD;  Location: [place];  Service: Ophthalmology;  Laterality: Right;   COLONOSCOPY W/ POLYPECTOMY     IR FLUORO GUIDED NEEDLE PLC ASPIRATION/INJECTION LOC  04/10/2020   JOINT REPLACEMENT     LACERATION REPAIR Right ~ 2012   leg   LUMBAR LAMINECTOMY/DECOMPRESSION MICRODISCECTOMY N/A 07/24/2019   Procedure: Decompressive Lumbar Laminectomy Lumbar three-four Lumbar four-five for Epidural Abscess;  Surgeon: Kary Kos, MD;  Location: [place];  Service: Neurosurgery;  Laterality: N/A;   TOTAL HIP ARTHROPLASTY Right 01/11/2013   Procedure: TOTAL HIP ARTHROPLASTY;  Surgeon: Garald Balding, MD;  Location: [place];  Service: Orthopedics;  Laterality: Right;    TOTAL HIP ARTHROPLASTY Left 10/12/2016   Procedure: TOTAL HIP ARTHROPLASTY;  Surgeon: Garald Balding, MD;  Location: [place];  Service: Orthopedics;  Laterality: Left;   TOTAL HIP ARTHROPLASTY Left 04/11/2020   Procedure: LEFT HIP IRRIGATION AND DEBRIDEMENT WITH HEAD AND POLY SWAP;  Surgeon: Leandrew Koyanagi, MD;  Location: [place];  Service: Orthopedics;  Laterality: Left;   TOTAL HIP REVISION Left 06/21/2020   Procedure: LEFT TOTAL HIP REVISION;  Surgeon: Leandrew Koyanagi, MD;  Location: [place];  Service: Orthopedics;  Laterality: Left;   Past Surgical History:  Procedure Laterality Date   CARPAL TUNNEL RELEASE Bilateral 2009   CATARACT EXTRACTION W/PHACO Left 12/17/2019   Procedure: CATARACT EXTRACTION PHACO AND INTRAOCULAR LENS PLACEMENT ([place]) LEFT;  Surgeon: Eulogio Bear, MD;  Location: [place];  Service: Ophthalmology;  Laterality: Left;  3.61 0:26.9   CATARACT EXTRACTION W/PHACO Right 03/17/2020   Procedure: CATARACT EXTRACTION PHACO AND INTRAOCULAR LENS PLACEMENT ([facility]) RIGHT 4.41  00:28.1;  Surgeon: Eulogio Bear, MD;  Location: [place];  Service: Ophthalmology;  Laterality: Right;   COLONOSCOPY W/ POLYPECTOMY     IR FLUORO GUIDED NEEDLE PLC ASPIRATION/INJECTION LOC  04/10/2020   JOINT REPLACEMENT     LACERATION REPAIR Right ~ 2012   leg   LUMBAR LAMINECTOMY/DECOMPRESSION MICRODISCECTOMY N/A 07/24/2019   Procedure: Decompressive Lumbar Laminectomy Lumbar three-four Lumbar four-five for Epidural Abscess;  Surgeon: Kary Kos, MD;  Location: [place];  Service: Neurosurgery;  Laterality: N/A;   TOTAL HIP ARTHROPLASTY Right 01/11/2013   Procedure: TOTAL HIP ARTHROPLASTY;  Surgeon: Garald Balding, MD;  Location: [place];  Service: Orthopedics;  Laterality: Right;   TOTAL HIP ARTHROPLASTY Left 10/12/2016   Procedure: TOTAL HIP ARTHROPLASTY;  Surgeon: Garald Balding, MD;  Location: [place];  Service: Orthopedics;  Laterality: Left;   TOTAL HIP ARTHROPLASTY Left  04/11/2020   Procedure: LEFT HIP IRRIGATION AND DEBRIDEMENT WITH HEAD AND POLY SWAP;  Surgeon: Leandrew Koyanagi, MD;  Location: [place];  Service: Orthopedics;  Laterality: Left;   TOTAL HIP REVISION Left 06/21/2020   Procedure: LEFT TOTAL HIP REVISION;  Surgeon: Leandrew Koyanagi, MD;  Location: [place];  Service: Orthopedics;  Laterality: Left;   Past Medical History:  Diagnosis Date   Cauda equina syndrome ([place]) 07/31/2019   Cerebral septic emboli ([place]) 07/30/2019   H/O MSSA epidural abscess, L2-L5 07/25/2019   History of chicken pox    Medical history non-contributory    There were no vitals taken for this visit.  Opioid Risk Score:   Fall Risk Score:  `1  Depression screen  PHQ 2/9  Depression screen [facility] 2/9 06/30/2021 05/29/2021 02/19/2021 02/05/2021 01/08/2021 12/31/2020 12/16/2020  Decreased Interest 0 0 0 0 2 0 0  Down, Depressed, Hopeless 0 0 0 0 0 0 0  PHQ - 2 Score 0 0 0 0 2 0 0  Altered sleeping - - - - 3 - -  Tired, decreased energy - - - - 0 - -  Change in appetite - - - - 0 - -  Feeling bad or failure about yourself  - - - - 0 - -  Trouble concentrating - - - - 0 - -  Moving slowly or fidgety/restless - - - - 0 - -  Suicidal thoughts - - - - 0 - -  PHQ-9 Score - - - - 5 - -  Difficult doing work/chores - - - - - - -    Review of Systems  Musculoskeletal:  Positive for back pain.       Left leg / groin pain  All other systems reviewed and are negative.     Objective:   Physical Exam Vitals and nursing note reviewed.  Constitutional:      Appearance: Normal appearance.  Cardiovascular:     Rate and Rhythm: Normal rate and regular rhythm.     Pulses: Normal pulses.     Heart sounds: Normal heart sounds.  Pulmonary:     Effort: Pulmonary effort is normal.     Breath sounds: Normal breath sounds.  Musculoskeletal:     Cervical back: Normal range of motion and neck supple.     Comments: Normal Muscle Bulk and Muscle Testing Reveals:  Upper Extremities: Full ROM and Muscle  Strength 5/5  Lumbar Hypersensitivity Left Greater Trochanter Tenderness Lower Extremities: Full ROM and Muscle Strength 5/5 Left Lower Extremity Flexion Produces Pain into his left hip Arises from chair with ease Narrow based  Gait     Skin:    General: Skin is warm and dry.  Neurological:     Mental Status: He is alert and oriented to person, place, and time.  Psychiatric:        Mood and Affect: Mood normal.        Behavior: Behavior normal.         Assessment & Plan:  S/P Lumbar Laminectomy: Continue HEP as Tolerated. Continue to Monitor. 07/28/2021 Chromic Pain Syndrome: Refilled: Oxycodone 5/325 mg one tablet twice a day as needed for pain #60 We will continue the opioid monitoring program, this consists of regular clinic visits, examinations, urine drug screen, pill counts as well as use of [place] Controlled Substance Reporting system. A 12 month History has been reviewed on the [place] Controlled Substance Reporting System on 07/28/2021 3. Left greater Trochanter Tenderness: Continue to alternate Ice and Heat Therapy. Continue to monitor.  07/28/2021   F/U in 1 month

## 2021-07-29 ENCOUNTER — Telehealth: Payer: Self-pay | Admitting: Orthopaedic Surgery

## 2021-07-29 NOTE — Telephone Encounter (Signed)
Not received as of yet

## 2021-07-29 NOTE — Telephone Encounter (Signed)
[facility] called and they faxed over a clearance form that needs to be filled out.   CB [phone]

## 2021-07-29 NOTE — Telephone Encounter (Signed)
Called to let them know we have not received it. Left VM. Asked if They can refax.

## 2021-08-24 ENCOUNTER — Other Ambulatory Visit: Payer: Self-pay | Admitting: Physical Medicine and Rehabilitation

## 2021-08-26 ENCOUNTER — Encounter: Payer: 59 | Attending: Physical Medicine and Rehabilitation | Admitting: Registered Nurse

## 2021-08-26 ENCOUNTER — Other Ambulatory Visit: Payer: Self-pay

## 2021-08-26 VITALS — BP 118/75 | HR 70 | Ht 72.0 in | Wt 268.0 lb

## 2021-08-26 DIAGNOSIS — M7062 Trochanteric bursitis, left hip: Secondary | ICD-10-CM | POA: Diagnosis not present

## 2021-08-26 DIAGNOSIS — Z79899 Other long term (current) drug therapy: Secondary | ICD-10-CM | POA: Diagnosis present

## 2021-08-26 DIAGNOSIS — M5416 Radiculopathy, lumbar region: Secondary | ICD-10-CM | POA: Insufficient documentation

## 2021-08-26 DIAGNOSIS — Z5181 Encounter for therapeutic drug level monitoring: Secondary | ICD-10-CM | POA: Diagnosis not present

## 2021-08-26 DIAGNOSIS — G894 Chronic pain syndrome: Secondary | ICD-10-CM | POA: Insufficient documentation

## 2021-08-26 MED ORDER — OXYCODONE-ACETAMINOPHEN 5-325 MG PO TABS: 1.0000 | ORAL_TABLET | Freq: Two times a day (BID) | ORAL | 0 refills | Status: DC | PRN

## 2021-08-26 NOTE — Progress Notes (Signed)
Subjective:    Patient ID: [name], male    DOB: 06/12/1966, 56 y.o.   MRN: [id number]  HPI: [name] is a 56 y.o. male who returns for follow up appointment for chronic pain and medication refill. He states his pain is located in his lower back radiating into his left hip. He rates his pain 7. His current exercise regime is walking and performing stretching exercises.  Mr. Tokunaga Morphine equivalent is 10.00 MME.   Last UDS was Performed on 05/29/2021, it was consistent.   Pain Inventory Average Pain 6 Pain Right Now 7 My pain is sharp, burning, and stabbing  In the last 24 hours, has pain interfered with the following? General activity 5 Relation with others 4 Enjoyment of life 3 What TIME of day is your pain at its worst? daytime and evening Sleep (in general) Poor  Pain is worse with: walking, bending, and standing Pain improves with: medication Relief from Meds: 6  Family History  Problem Relation Age of Onset   Cancer Father        Hodgkin's disease   COPD Mother    Heart attack Maternal Grandfather 80   Diabetes Neg Hx    Stroke Neg Hx    Hypertension Neg Hx    Hyperlipidemia Neg Hx    Social History   Socioeconomic History   Marital status: Married    Spouse name: Manuela Schwartz   Number of children: 2   Years of education: High school   Highest education level: Not on file  Occupational History   Occupation: burial Occupational hygienist: Doctor, hospital    Comment: [employer]  Tobacco Use   Smoking status: Former    Packs/day: 1.00    Years: 14.00    Pack years: 14.00    Types: Cigarettes    Quit date: 07/27/1999    Years since quitting: 22.0   Smokeless tobacco: Never  Vaping Use   Vaping Use: Never used  Substance and Sexual Activity   Alcohol use: Not Currently    Alcohol/week: 24.0 standard drinks    Types: 24 Cans of beer per week    Comment: 4-5 beers per day, case of beer per week   Drug use: No   Sexual activity: Yes  Other Topics Concern    Not on file  Social History Narrative   02/05/19   From: the area   Living: Living with wife Manuela Schwartz   Work: Dealer      Family: 2 children - [name] and [name] - liver nearby, no grandkids      Enjoys: float down the river, race track, camping      Exercise: just keeping busy at work   Diet: mostly meat and potatoes      Safety   Seat belts: Yes    Guns: Yes  and secure   Safe in relationships: Yes    Social Determinants of Health   Financial Resource Strain: Not on file  Food Insecurity: Not on file  Transportation Needs: Not on file  Physical Activity: Not on file  Stress: Not on file  Social Connections: Not on file   Past Surgical History:  Procedure Laterality Date   CARPAL TUNNEL RELEASE Bilateral 2009   CATARACT EXTRACTION W/PHACO Left 12/17/2019   Procedure: CATARACT EXTRACTION PHACO AND INTRAOCULAR LENS PLACEMENT ([place]) LEFT;  Surgeon: Eulogio Bear, MD;  Location: [place];  Service: Ophthalmology;  Laterality: Left;  3.61 0:26.9   CATARACT  EXTRACTION W/PHACO Right 03/17/2020   Procedure: CATARACT EXTRACTION PHACO AND INTRAOCULAR LENS PLACEMENT ([facility]) RIGHT 4.41  00:28.1;  Surgeon: Eulogio Bear, MD;  Location: [place];  Service: Ophthalmology;  Laterality: Right;   COLONOSCOPY W/ POLYPECTOMY     IR FLUORO GUIDED NEEDLE PLC ASPIRATION/INJECTION LOC  04/10/2020   JOINT REPLACEMENT     LACERATION REPAIR Right ~ 2012   leg   LUMBAR LAMINECTOMY/DECOMPRESSION MICRODISCECTOMY N/A 07/24/2019   Procedure: Decompressive Lumbar Laminectomy Lumbar three-four Lumbar four-five for Epidural Abscess;  Surgeon: Kary Kos, MD;  Location: [place];  Service: Neurosurgery;  Laterality: N/A;   TOTAL HIP ARTHROPLASTY Right 01/11/2013   Procedure: TOTAL HIP ARTHROPLASTY;  Surgeon: Garald Balding, MD;  Location: [place];  Service: Orthopedics;  Laterality: Right;   TOTAL HIP ARTHROPLASTY Left 10/12/2016   Procedure: TOTAL HIP ARTHROPLASTY;  Surgeon: Garald Balding, MD;  Location: [place];  Service: Orthopedics;  Laterality: Left;   TOTAL HIP ARTHROPLASTY Left 04/11/2020   Procedure: LEFT HIP IRRIGATION AND DEBRIDEMENT WITH HEAD AND POLY SWAP;  Surgeon: Leandrew Koyanagi, MD;  Location: [place];  Service: Orthopedics;  Laterality: Left;   TOTAL HIP REVISION Left 06/21/2020   Procedure: LEFT TOTAL HIP REVISION;  Surgeon: Leandrew Koyanagi, MD;  Location: [place];  Service: Orthopedics;  Laterality: Left;   Past Surgical History:  Procedure Laterality Date   CARPAL TUNNEL RELEASE Bilateral 2009   CATARACT EXTRACTION W/PHACO Left 12/17/2019   Procedure: CATARACT EXTRACTION PHACO AND INTRAOCULAR LENS PLACEMENT ([place]) LEFT;  Surgeon: Eulogio Bear, MD;  Location: [place];  Service: Ophthalmology;  Laterality: Left;  3.61 0:26.9   CATARACT EXTRACTION W/PHACO Right 03/17/2020   Procedure: CATARACT EXTRACTION PHACO AND INTRAOCULAR LENS PLACEMENT ([facility]) RIGHT 4.41  00:28.1;  Surgeon: Eulogio Bear, MD;  Location: [place];  Service: Ophthalmology;  Laterality: Right;   COLONOSCOPY W/ POLYPECTOMY     IR FLUORO GUIDED NEEDLE PLC ASPIRATION/INJECTION LOC  04/10/2020   JOINT REPLACEMENT     LACERATION REPAIR Right ~ 2012   leg   LUMBAR LAMINECTOMY/DECOMPRESSION MICRODISCECTOMY N/A 07/24/2019   Procedure: Decompressive Lumbar Laminectomy Lumbar three-four Lumbar four-five for Epidural Abscess;  Surgeon: Kary Kos, MD;  Location: [place];  Service: Neurosurgery;  Laterality: N/A;   TOTAL HIP ARTHROPLASTY Right 01/11/2013   Procedure: TOTAL HIP ARTHROPLASTY;  Surgeon: Garald Balding, MD;  Location: [place];  Service: Orthopedics;  Laterality: Right;   TOTAL HIP ARTHROPLASTY Left 10/12/2016   Procedure: TOTAL HIP ARTHROPLASTY;  Surgeon: Garald Balding, MD;  Location: [place];  Service: Orthopedics;  Laterality: Left;   TOTAL HIP ARTHROPLASTY Left 04/11/2020   Procedure: LEFT HIP IRRIGATION AND DEBRIDEMENT WITH HEAD AND POLY SWAP;  Surgeon: Leandrew Koyanagi, MD;  Location: [place];  Service: Orthopedics;  Laterality: Left;   TOTAL HIP REVISION Left 06/21/2020   Procedure: LEFT TOTAL HIP REVISION;  Surgeon: Leandrew Koyanagi, MD;  Location: [place];  Service: Orthopedics;  Laterality: Left;   Past Medical History:  Diagnosis Date   Cauda equina syndrome ([place]) 07/31/2019   Cerebral septic emboli ([place]) 07/30/2019   H/O MSSA epidural abscess, L2-L5 07/25/2019   History of chicken pox    Medical history non-contributory    BP 118/75    Pulse 70    Ht 6' (1.829 m)    Wt 268 lb (121.6 kg)    SpO2 98%    BMI 36.35 kg/m   Opioid Risk  Score:   Fall Risk Score:  `1  Depression screen PHQ 2/9  Depression screen [facility] 2/9 07/28/2021 06/30/2021 05/29/2021 02/19/2021 02/05/2021 01/08/2021 12/31/2020  Decreased Interest 0 0 0 0 0 2 0  Down, Depressed, Hopeless 0 0 0 0 0 0 0  PHQ - 2 Score 0 0 0 0 0 2 0  Altered sleeping - - - - - 3 -  Tired, decreased energy - - - - - 0 -  Change in appetite - - - - - 0 -  Feeling bad or failure about yourself  - - - - - 0 -  Trouble concentrating - - - - - 0 -  Moving slowly or fidgety/restless - - - - - 0 -  Suicidal thoughts - - - - - 0 -  PHQ-9 Score - - - - - 5 -  Difficult doing work/chores - - - - - - -  Some recent data might be hidden      Review of Systems  Musculoskeletal:  Positive for back pain.       Buttock pain  All other systems reviewed and are negative.     Objective:   Physical Exam Vitals and nursing note reviewed.  Constitutional:      Appearance: Normal appearance.  Cardiovascular:     Rate and Rhythm: Normal rate and regular rhythm.     Pulses: Normal pulses.     Heart sounds: Normal heart sounds.  Pulmonary:     Effort: Pulmonary effort is normal.     Breath sounds: Normal breath sounds.  Musculoskeletal:     Cervical back: Normal range of motion and neck supple.     Comments: Normal Muscle Bulk and Muscle Testing Reveals:  Upper Extremities: Full ROM and Muscle Strength 5/5 Lumbar  Paraspinal Tenderness: L-4-L-5 Left Greater trochanter tenderness Lower Extremities : Right: Full ROM and Muscle Strength 5/5 Left Lower Extremity: Decreased ROM and Muscle Strength 5/5 Left Lower Extremity Flexion Produces Pain into his Left Hip Arises from chair slowly Narrow Based Gait     Skin:    General: Skin is warm and dry.  Neurological:     Mental Status: He is alert and oriented to person, place, and time.  Psychiatric:        Mood and Affect: Mood normal.        Behavior: Behavior normal.         Assessment & Plan:  S/P Lumbar Laminectomy: Continue HEP as Tolerated. Continue to Monitor. 08/26/2021 Chromic Pain Syndrome: Refilled: Oxycodone 5/325 mg one tablet twice a day as needed for pain #60. Second script sent for following month.  We will continue the opioid monitoring program, this consists of regular clinic visits, examinations, urine drug screen, pill counts as well as use of [place] Controlled Substance Reporting system. A 12 month History has been reviewed on the [place] Controlled Substance Reporting System on 08/26/2021 3. Left greater Trochanter Tenderness: Continue to alternate Ice and Heat Therapy. Continue to monitor.  08/26/2021   F/U in 1 month

## 2021-08-28 ENCOUNTER — Encounter: Payer: Self-pay | Admitting: Registered Nurse

## 2021-09-24 ENCOUNTER — Other Ambulatory Visit: Payer: Self-pay | Admitting: Physical Medicine and Rehabilitation

## 2021-09-24 ENCOUNTER — Other Ambulatory Visit: Payer: Self-pay | Admitting: Family Medicine

## 2021-10-20 ENCOUNTER — Encounter: Payer: 59 | Attending: Physical Medicine and Rehabilitation | Admitting: Registered Nurse

## 2021-10-20 ENCOUNTER — Other Ambulatory Visit: Payer: Self-pay

## 2021-10-20 ENCOUNTER — Encounter: Payer: Self-pay | Admitting: Registered Nurse

## 2021-10-20 VITALS — BP 125/67 | HR 68 | Ht 72.0 in | Wt 268.0 lb

## 2021-10-20 DIAGNOSIS — G894 Chronic pain syndrome: Secondary | ICD-10-CM | POA: Insufficient documentation

## 2021-10-20 DIAGNOSIS — M5416 Radiculopathy, lumbar region: Secondary | ICD-10-CM | POA: Insufficient documentation

## 2021-10-20 DIAGNOSIS — M7062 Trochanteric bursitis, left hip: Secondary | ICD-10-CM | POA: Insufficient documentation

## 2021-10-20 DIAGNOSIS — Z79899 Other long term (current) drug therapy: Secondary | ICD-10-CM | POA: Insufficient documentation

## 2021-10-20 DIAGNOSIS — Z5181 Encounter for therapeutic drug level monitoring: Secondary | ICD-10-CM | POA: Insufficient documentation

## 2021-10-20 MED ORDER — OXYCODONE-ACETAMINOPHEN 5-325 MG PO TABS: 1.0000 | ORAL_TABLET | Freq: Three times a day (TID) | ORAL | 0 refills | Status: DC | PRN

## 2021-10-20 MED ORDER — OXYCODONE-ACETAMINOPHEN 5-325 MG PO TABS: 1.0000 | ORAL_TABLET | Freq: Two times a day (BID) | ORAL | 0 refills | Status: DC | PRN

## 2021-10-20 NOTE — Progress Notes (Signed)
? ?Subjective:  ? ? Patient ID: [name], male    DOB: Oct 12, 1965, 56 y.o.   MRN: [id number] ? ?HPI: [name] is a 56 y.o. male who returns for follow up appointment for chronic pain and medication refill. He states his pain is located in his lower back radiating into his left lower extremity. He also reports his current medication regimen only lasting 4- 5 hours of relief . He rates his pain 7. His current exercise regime is walking and performing stretching exercises. ? ?[name] Morphine equivalent is 15.00 MME.   UDS ordered today.  ?  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  ? ?Pain Inventory ?Average Pain 7 ?Pain Right Now 7 ?My pain is constant, sharp, burning, dull, stabbing, tingling, and aching ? ?In the last 24 hours, has pain interfered with the following? ?General activity 6 ?Relation with others 6 ?Enjoyment of life 4 ?What TIME of day is your pain at its worst? daytime, evening, and night ?Sleep (in general) Poor ? ?Pain is worse with: walking, bending, standing, and some activites ?Pain improves with: rest, heat/ice, pacing activities, and medication ?Relief from Meds: 5 ? ?Family History  ?Problem Relation Age of Onset  ? Cancer Father   ?     Hodgkin's disease  ? COPD Mother   ? Heart attack Maternal Grandfather 80  ? Diabetes Neg Hx   ? Stroke Neg Hx   ? Hypertension Neg Hx   ? Hyperlipidemia Neg Hx   ? ?Social History  ? ?Socioeconomic History  ? Marital status: Married  ?  Spouse name: [name]  ? Number of children: 2  ? Years of education: High school  ? Highest education level: Not on file  ?Occupational History  ? Occupation: burial vault  ?  Employer: [employer]  ?  Comment: [employer]  ?Tobacco Use  ? Smoking status: Former  ?  Packs/day: 1.00  ?  Years: 14.00  ?  Pack years: 14.00  ?  Types: Cigarettes  ?  Quit date: 07/27/1999  ?  Years since quitting: 22.2  ? Smokeless tobacco: Never  ?Vaping Use  ? Vaping Use: Never used  ?Substance and Sexual  Activity  ? Alcohol use: Not Currently  ?  Alcohol/week: 24.0 standard drinks  ?  Types: 24 Cans of beer per week  ?  Comment: 4-5 beers per day, case of beer per week  ? Drug use: No  ? Sexual activity: Yes  ?Other Topics Concern  ? Not on file  ?Social History Narrative  ? 02/05/19  ? From: the area  ? Living: Living with wife [name]  ? Work: Dealer  ?   ? Family: 2 children - [name] and [name] - liver nearby, no grandkids  ?   ? Enjoys: float down the river, race track, camping  ?   ? Exercise: just keeping busy at work  ? Diet: mostly meat and potatoes  ?   ? Safety  ? Seat belts: Yes   ? Guns: Yes  and secure  ? Safe in relationships: Yes   ? ?Social Determinants of Health  ? ?Financial Resource Strain: Not on file  ?Food Insecurity: Not on file  ?Transportation Needs: Not on file  ?Physical Activity: Not on file  ?Stress: Not on file  ?Social Connections: Not on file  ? ?Past Surgical History:  ?Procedure Laterality Date  ? CARPAL TUNNEL RELEASE Bilateral 2009  ?  CATARACT EXTRACTION W/PHACO Left 12/17/2019  ? Procedure: CATARACT EXTRACTION PHACO AND INTRAOCULAR LENS PLACEMENT ([place]) LEFT;  Surgeon: Eulogio Bear, MD;  Location: [place];  Service: Ophthalmology;  Laterality: Left;  3.61 ?0:26.9  ? CATARACT EXTRACTION W/PHACO Right 03/17/2020  ? Procedure: CATARACT EXTRACTION PHACO AND INTRAOCULAR LENS PLACEMENT ([facility]) RIGHT 4.41  00:28.1;  Surgeon: Eulogio Bear, MD;  Location: [place];  Service: Ophthalmology;  Laterality: Right;  ? COLONOSCOPY W/ POLYPECTOMY    ? IR FLUORO GUIDED NEEDLE PLC ASPIRATION/INJECTION LOC  04/10/2020  ? JOINT REPLACEMENT    ? LACERATION REPAIR Right ~ 2012  ? leg  ? LUMBAR LAMINECTOMY/DECOMPRESSION MICRODISCECTOMY N/A 07/24/2019  ? Procedure: Decompressive Lumbar Laminectomy Lumbar three-four Lumbar four-five for Epidural Abscess;  Surgeon: Kary Kos, MD;  Location: [place];  Service: Neurosurgery;  Laterality: N/A;  ? TOTAL HIP ARTHROPLASTY Right  01/11/2013  ? Procedure: TOTAL HIP ARTHROPLASTY;  Surgeon: Garald Balding, MD;  Location: [place];  Service: Orthopedics;  Laterality: Right;  ? TOTAL HIP ARTHROPLASTY Left 10/12/2016  ? Procedure: TOTAL HIP ARTHROPLASTY;  Surgeon: Garald Balding, MD;  Location: [place];  Service: Orthopedics;  Laterality: Left;  ? TOTAL HIP ARTHROPLASTY Left 04/11/2020  ? Procedure: LEFT HIP IRRIGATION AND DEBRIDEMENT WITH HEAD AND POLY SWAP;  Surgeon: Leandrew Koyanagi, MD;  Location: [place];  Service: Orthopedics;  Laterality: Left;  ? TOTAL HIP REVISION Left 06/21/2020  ? Procedure: LEFT TOTAL HIP REVISION;  Surgeon: Leandrew Koyanagi, MD;  Location: [place];  Service: Orthopedics;  Laterality: Left;  ? ?Past Surgical History:  ?Procedure Laterality Date  ? CARPAL TUNNEL RELEASE Bilateral 2009  ? CATARACT EXTRACTION W/PHACO Left 12/17/2019  ? Procedure: CATARACT EXTRACTION PHACO AND INTRAOCULAR LENS PLACEMENT ([place]) LEFT;  Surgeon: Eulogio Bear, MD;  Location: [place];  Service: Ophthalmology;  Laterality: Left;  3.61 ?0:26.9  ? CATARACT EXTRACTION W/PHACO Right 03/17/2020  ? Procedure: CATARACT EXTRACTION PHACO AND INTRAOCULAR LENS PLACEMENT (I[facility]) RIGHT 4.41  00:28.1;  Surgeon: Eulogio Bear, MD;  Location: [place];  Service: Ophthalmology;  Laterality: Right;  ? COLONOSCOPY W/ POLYPECTOMY    ? IR FLUORO GUIDED NEEDLE PLC ASPIRATION/INJECTION LOC  04/10/2020  ? JOINT REPLACEMENT    ? LACERATION REPAIR Right ~ 2012  ? leg  ? LUMBAR LAMINECTOMY/DECOMPRESSION MICRODISCECTOMY N/A 07/24/2019  ? Procedure: Decompressive Lumbar Laminectomy Lumbar three-four Lumbar four-five for Epidural Abscess;  Surgeon: Kary Kos, MD;  Location: [place];  Service: Neurosurgery;  Laterality: N/A;  ? TOTAL HIP ARTHROPLASTY Right 01/11/2013  ? Procedure: TOTAL HIP ARTHROPLASTY;  Surgeon: Garald Balding, MD;  Location: [place];  Service: Orthopedics;  Laterality: Right;  ? TOTAL HIP ARTHROPLASTY Left 10/12/2016  ? Procedure: TOTAL HIP  ARTHROPLASTY;  Surgeon: Garald Balding, MD;  Location: [place];  Service: Orthopedics;  Laterality: Left;  ? TOTAL HIP ARTHROPLASTY Left 04/11/2020  ? Procedure: LEFT HIP IRRIGATION AND DEBRIDEMENT WITH HEAD AND POLY SWAP;  Surgeon: Leandrew Koyanagi, MD;  Location: [place];  Service: Orthopedics;  Laterality: Left;  ? TOTAL HIP REVISION Left 06/21/2020  ? Procedure: LEFT TOTAL HIP REVISION;  Surgeon: Leandrew Koyanagi, MD;  Location: [place];  Service: Orthopedics;  Laterality: Left;  ? ?Past Medical History:  ?Diagnosis Date  ? Cauda equina syndrome ([place]) 07/31/2019  ? Cerebral septic emboli ([place]) 07/30/2019  ? H/O MSSA epidural abscess, L2-L5 07/25/2019  ? History of chicken pox   ? Medical history non-contributory   ? ?  BP 125/67   Pulse 68   Ht 6' (1.829 m)   Wt 268 lb (121.6 kg)   SpO2 98%   BMI 36.35 kg/m?  ? ?Opioid Risk Score:   ?Fall Risk Score:  `1 ? ?Depression screen PHQ 2/9 ? ? ?  07/28/2021  ?  1:36 PM 06/30/2021  ? 11:54 AM 05/29/2021  ? 11:40 AM 02/19/2021  ? 10:57 AM 02/05/2021  ? 11:27 AM 01/08/2021  ?  9:53 AM 12/31/2020  ?  9:56 AM  ?Depression screen PHQ 2/9  ?Decreased Interest 0 0 0 0 0 2 0  ?Down, Depressed, Hopeless 0 0 0 0 0 0 0  ?PHQ - 2 Score 0 0 0 0 0 2 0  ?Altered sleeping      3   ?Tired, decreased energy      0   ?Change in appetite      0   ?Feeling bad or failure about yourself       0   ?Trouble concentrating      0   ?Moving slowly or fidgety/restless      0   ?Suicidal thoughts      0   ?PHQ-9 Score      5   ? ? ?Review of Systems  ?Musculoskeletal:  Positive for back pain.  ?     Right leg pain  ? ?   ?Objective:  ? Physical Exam ?Vitals and nursing note reviewed.  ?Constitutional:   ?   Appearance: Normal appearance.  ?Cardiovascular:  ?   Rate and Rhythm: Normal rate and regular rhythm.  ?   Pulses: Normal pulses.  ?   Heart sounds: Normal heart sounds.  ?Pulmonary:  ?   Effort: Pulmonary effort is normal.  ?   Breath sounds: Normal breath sounds.  ?Musculoskeletal:  ?   Cervical back: Normal  range of motion and neck supple.  ?   Comments: Normal Muscle Bulk and Muscle Testing Reveals:  ?Upper Extremities: Full ROM and Muscle Strength 5/5 ? Lumbar Paraspinal Tenderness: L-4-L-5 ?Left Greater Trochanter T

## 2021-10-26 LAB — TOXASSURE SELECT,+ANTIDEPR,UR

## 2021-11-03 ENCOUNTER — Telehealth: Payer: Self-pay | Admitting: *Deleted

## 2021-11-03 NOTE — Telephone Encounter (Signed)
Urine drug screen for this encounter is consistent for prescribed medication 

## 2021-12-17 ENCOUNTER — Encounter: Payer: 59 | Attending: Physical Medicine and Rehabilitation | Admitting: Registered Nurse

## 2021-12-17 ENCOUNTER — Encounter: Payer: Self-pay | Admitting: Registered Nurse

## 2021-12-17 VITALS — BP 145/88 | HR 63 | Ht 72.0 in | Wt 265.0 lb

## 2021-12-17 DIAGNOSIS — Z5181 Encounter for therapeutic drug level monitoring: Secondary | ICD-10-CM | POA: Diagnosis not present

## 2021-12-17 DIAGNOSIS — G894 Chronic pain syndrome: Secondary | ICD-10-CM | POA: Insufficient documentation

## 2021-12-17 DIAGNOSIS — M5416 Radiculopathy, lumbar region: Secondary | ICD-10-CM | POA: Diagnosis not present

## 2021-12-17 DIAGNOSIS — M7062 Trochanteric bursitis, left hip: Secondary | ICD-10-CM | POA: Diagnosis not present

## 2021-12-17 DIAGNOSIS — Z79899 Other long term (current) drug therapy: Secondary | ICD-10-CM | POA: Insufficient documentation

## 2021-12-17 MED ORDER — OXYCODONE-ACETAMINOPHEN 5-325 MG PO TABS: 1.0000 | ORAL_TABLET | Freq: Three times a day (TID) | ORAL | 0 refills | Status: DC | PRN

## 2021-12-17 NOTE — Progress Notes (Signed)
Subjective:    Patient ID: [name], male    DOB: 12-31-1965, 56 y.o.   MRN: [id number]  HPI: [name] is a 56 y.o. male who returns for follow up appointment for chronic pain and medication refill. He states his pain is located in his left hip and left lower extremity. He  rates his  pain 8. His current exercise regime is walking and performing stretching exercises.  [name] Morphine equivalent is 22.50 MME.   Last UDS was Performed on 10/20/2021, it was consistent.      Pain Inventory Average Pain 7 Pain Right Now 8 My pain is constant, sharp, burning, and stabbing  In the last 24 hours, has pain interfered with the following? General activity 8 Relation with others 8 Enjoyment of life 8 What TIME of day is your pain at its worst? daytime and evening Sleep (in general) Poor  Pain is worse with: walking, sitting, standing, and some activites Pain improves with: rest, heat/ice, and medication Relief from Meds: 6  Family History  Problem Relation Age of Onset   Cancer Father        Hodgkin's disease   COPD Mother    Heart attack Maternal Grandfather 80   Diabetes Neg Hx    Stroke Neg Hx    Hypertension Neg Hx    Hyperlipidemia Neg Hx    Social History   Socioeconomic History   Marital status: Married    Spouse name: [name]   Number of children: 2   Years of education: High school   Highest education level: Not on file  Occupational History   Occupation: burial Occupational hygienist: [employer]    Comment: [employer]  Tobacco Use   Smoking status: Former    Packs/day: 1.00    Years: 14.00    Pack years: 14.00    Types: Cigarettes    Quit date: 07/27/1999    Years since quitting: 22.4   Smokeless tobacco: Never  Vaping Use   Vaping Use: Never used  Substance and Sexual Activity   Alcohol use: Not Currently    Alcohol/week: 24.0 standard drinks    Types: 24 Cans of beer per week    Comment: 4-5 beers per day, case of beer per week   Drug use: No    Sexual activity: Yes  Other Topics Concern   Not on file  Social History Narrative   02/05/19   From: the area   Living: Living with wife [name]   Work: Dealer      Family: 2 children - [name] and [name] - liver nearby, no grandkids      Enjoys: float down the river, race track, camping      Exercise: just keeping busy at work   Diet: mostly meat and potatoes      Safety   Seat belts: Yes    Guns: Yes  and secure   Safe in relationships: Yes    Social Determinants of Health   Financial Resource Strain: Not on file  Food Insecurity: Not on file  Transportation Needs: Not on file  Physical Activity: Not on file  Stress: Not on file  Social Connections: Not on file   Past Surgical History:  Procedure Laterality Date   CARPAL TUNNEL RELEASE Bilateral 2009   CATARACT EXTRACTION W/PHACO Left 12/17/2019   Procedure: CATARACT EXTRACTION PHACO AND INTRAOCULAR LENS PLACEMENT ([place]) LEFT;  Surgeon: Eulogio Bear, MD;  Location: [place];  Service:  Ophthalmology;  Laterality: Left;  3.61 0:26.9   CATARACT EXTRACTION W/PHACO Right 03/17/2020   Procedure: CATARACT EXTRACTION PHACO AND INTRAOCULAR LENS PLACEMENT ([facility]) RIGHT 4.41  00:28.1;  Surgeon: Eulogio Bear, MD;  Location: [place];  Service: Ophthalmology;  Laterality: Right;   COLONOSCOPY W/ POLYPECTOMY     IR FLUORO GUIDED NEEDLE PLC ASPIRATION/INJECTION LOC  04/10/2020   JOINT REPLACEMENT     LACERATION REPAIR Right ~ 2012   leg   LUMBAR LAMINECTOMY/DECOMPRESSION MICRODISCECTOMY N/A 07/24/2019   Procedure: Decompressive Lumbar Laminectomy Lumbar three-four Lumbar four-five for Epidural Abscess;  Surgeon: Kary Kos, MD;  Location: [place];  Service: Neurosurgery;  Laterality: N/A;   TOTAL HIP ARTHROPLASTY Right 01/11/2013   Procedure: TOTAL HIP ARTHROPLASTY;  Surgeon: Garald Balding, MD;  Location: [place];  Service: Orthopedics;  Laterality: Right;   TOTAL HIP ARTHROPLASTY Left 10/12/2016    Procedure: TOTAL HIP ARTHROPLASTY;  Surgeon: Garald Balding, MD;  Location: [place];  Service: Orthopedics;  Laterality: Left;   TOTAL HIP ARTHROPLASTY Left 04/11/2020   Procedure: LEFT HIP IRRIGATION AND DEBRIDEMENT WITH HEAD AND POLY SWAP;  Surgeon: Leandrew Koyanagi, MD;  Location: [place];  Service: Orthopedics;  Laterality: Left;   TOTAL HIP REVISION Left 06/21/2020   Procedure: LEFT TOTAL HIP REVISION;  Surgeon: Leandrew Koyanagi, MD;  Location: [place];  Service: Orthopedics;  Laterality: Left;   Past Surgical History:  Procedure Laterality Date   CARPAL TUNNEL RELEASE Bilateral 2009   CATARACT EXTRACTION W/PHACO Left 12/17/2019   Procedure: CATARACT EXTRACTION PHACO AND INTRAOCULAR LENS PLACEMENT ([place]) LEFT;  Surgeon: Eulogio Bear, MD;  Location: [place];  Service: Ophthalmology;  Laterality: Left;  3.61 0:26.9   CATARACT EXTRACTION W/PHACO Right 03/17/2020   Procedure: CATARACT EXTRACTION PHACO AND INTRAOCULAR LENS PLACEMENT ([facility]) RIGHT 4.41  00:28.1;  Surgeon: Eulogio Bear, MD;  Location: [place];  Service: Ophthalmology;  Laterality: Right;   COLONOSCOPY W/ POLYPECTOMY     IR FLUORO GUIDED NEEDLE PLC ASPIRATION/INJECTION LOC  04/10/2020   JOINT REPLACEMENT     LACERATION REPAIR Right ~ 2012   leg   LUMBAR LAMINECTOMY/DECOMPRESSION MICRODISCECTOMY N/A 07/24/2019   Procedure: Decompressive Lumbar Laminectomy Lumbar three-four Lumbar four-five for Epidural Abscess;  Surgeon: Kary Kos, MD;  Location: [place];  Service: Neurosurgery;  Laterality: N/A;   TOTAL HIP ARTHROPLASTY Right 01/11/2013   Procedure: TOTAL HIP ARTHROPLASTY;  Surgeon: Garald Balding, MD;  Location: [place];  Service: Orthopedics;  Laterality: Right;   TOTAL HIP ARTHROPLASTY Left 10/12/2016   Procedure: TOTAL HIP ARTHROPLASTY;  Surgeon: Garald Balding, MD;  Location: [place];  Service: Orthopedics;  Laterality: Left;   TOTAL HIP ARTHROPLASTY Left 04/11/2020   Procedure: LEFT HIP IRRIGATION AND  DEBRIDEMENT WITH HEAD AND POLY SWAP;  Surgeon: Leandrew Koyanagi, MD;  Location: [place];  Service: Orthopedics;  Laterality: Left;   TOTAL HIP REVISION Left 06/21/2020   Procedure: LEFT TOTAL HIP REVISION;  Surgeon: Leandrew Koyanagi, MD;  Location: [place];  Service: Orthopedics;  Laterality: Left;   Past Medical History:  Diagnosis Date   Cauda equina syndrome ([place]) 07/31/2019   Cerebral septic emboli ([place]) 07/30/2019   H/O MSSA epidural abscess, L2-L5 07/25/2019   History of chicken pox    Medical history non-contributory    There were no vitals taken for this visit.  Opioid Risk Score:   Fall Risk Score:  `1  Depression screen [facility] 2/9  10/20/2021    1:13 PM 07/28/2021    1:36 PM 06/30/2021   11:54 AM 05/29/2021   11:40 AM 02/19/2021   10:57 AM 02/05/2021   11:27 AM 01/08/2021    9:53 AM  Depression screen PHQ 2/9  Decreased Interest 0 0 0 0 0 0 2  Down, Depressed, Hopeless 0 0 0 0 0 0 0  PHQ - 2 Score 0 0 0 0 0 0 2  Altered sleeping       3  Tired, decreased energy       0  Change in appetite       0  Feeling bad or failure about yourself        0  Trouble concentrating       0  Moving slowly or fidgety/restless       0  Suicidal thoughts       0  PHQ-9 Score       5    Review of Systems  Musculoskeletal:  Positive for back pain.       Left hip pain  All other systems reviewed and are negative.     Objective:   Physical Exam Vitals and nursing note reviewed.  Constitutional:      Appearance: Normal appearance.  Cardiovascular:     Rate and Rhythm: Normal rate and regular rhythm.     Pulses: Normal pulses.     Heart sounds: Normal heart sounds.  Pulmonary:     Effort: Pulmonary effort is normal.     Breath sounds: Normal breath sounds.  Musculoskeletal:     Cervical back: Normal range of motion and neck supple.     Comments: Normal Muscle Bulk and Muscle Testing Reveals:  Upper Extremities: Full ROM and Muscle Strength 5/5 Lumbar Paraspinal Tenderness: L-3-L-5 Lower  Extremities Full ROM and Muscle Strength 5/5 Left Lower Extremity Flexion Produces Pain into his Left Hip He arises from Chair with ease Narrow Based Gait     Skin:    General: Skin is warm and dry.  Neurological:     Mental Status: He is alert and oriented to person, place, and time.  Psychiatric:        Mood and Affect: Mood normal.        Behavior: Behavior normal.         Assessment & Plan:  S/P Lumbar Laminectomy Lumbar Radiculitis: Continue current medication regimen. Continue HEP as Tolerated. Continue to Monitor. 12/17/2021 Chromic Pain Syndrome: Refilled: Oxycodone 5/325 mg one tablet three times a day as needed for pain #90. Second script sent for following month.  We will continue the opioid monitoring program, this consists of regular clinic visits, examinations, urine drug screen, pill counts as well as use of [place] Controlled Substance Reporting system. A 12 month History has been reviewed on the [place] Controlled Substance Reporting System on 12/17/2021 3. Left greater Trochanter Tenderness: Continue to alternate Ice and Heat Therapy. Continue to monitor.  12/17/2021   F/U in 2 months

## 2022-02-16 ENCOUNTER — Encounter: Payer: 59 | Attending: Physical Medicine and Rehabilitation | Admitting: Registered Nurse

## 2022-02-16 VITALS — BP 127/73 | HR 71 | Ht 72.0 in | Wt 269.0 lb

## 2022-02-16 DIAGNOSIS — M545 Low back pain, unspecified: Secondary | ICD-10-CM | POA: Diagnosis not present

## 2022-02-16 DIAGNOSIS — Z79899 Other long term (current) drug therapy: Secondary | ICD-10-CM | POA: Diagnosis not present

## 2022-02-16 DIAGNOSIS — G894 Chronic pain syndrome: Secondary | ICD-10-CM | POA: Diagnosis not present

## 2022-02-16 DIAGNOSIS — Z5181 Encounter for therapeutic drug level monitoring: Secondary | ICD-10-CM | POA: Diagnosis not present

## 2022-02-16 DIAGNOSIS — G8929 Other chronic pain: Secondary | ICD-10-CM | POA: Diagnosis present

## 2022-02-16 MED ORDER — OXYCODONE-ACETAMINOPHEN 5-325 MG PO TABS: 1.0000 | ORAL_TABLET | Freq: Three times a day (TID) | ORAL | 0 refills | Status: DC | PRN

## 2022-02-16 NOTE — Progress Notes (Signed)
Subjective:    Patient ID: BRAUN ROCCA, male    DOB: 11/25/1965, 56 y.o.   MRN: [id number]  HPI: [name] is a 56 y.o. male who returns for follow up appointment for chronic pain and medication refill. He states his  pain is located in his lower back. He rates his pain 8 His current exercise regime is walking and performing stretching exercises.  Mr. Angerer Morphine equivalent is 22.50 MME.   Last UDS was Performed on 10/20/2021, it was consistent.     Pain Inventory Average Pain 7 Pain Right Now 8 My pain is sharp, burning, and stabbing  In the last 24 hours, has pain interfered with the following? General activity 6 Relation with others 5 Enjoyment of life 4 What TIME of day is your pain at its worst? morning , daytime, and evening Sleep (in general) Poor  Pain is worse with: walking, bending, and standing Pain improves with: rest, heat/ice, and medication Relief from Meds: 6  Family History  Problem Relation Age of Onset   Cancer Father        Hodgkin's disease   COPD Mother    Heart attack Maternal Grandfather 80   Diabetes Neg Hx    Stroke Neg Hx    Hypertension Neg Hx    Hyperlipidemia Neg Hx    Social History   Socioeconomic History   Marital status: Married    Spouse name: Manuela Schwartz   Number of children: 2   Years of education: High school   Highest education level: Not on file  Occupational History   Occupation: burial Occupational hygienist: Doctor, hospital    Comment: [employer]  Tobacco Use   Smoking status: Former    Packs/day: 1.00    Years: 14.00    Total pack years: 14.00    Types: Cigarettes    Quit date: 07/27/1999    Years since quitting: 22.5   Smokeless tobacco: Never  Vaping Use   Vaping Use: Never used  Substance and Sexual Activity   Alcohol use: Not Currently    Alcohol/week: 24.0 standard drinks of alcohol    Types: 24 Cans of beer per week    Comment: 4-5 beers per day, case of beer per week   Drug use: No   Sexual activity: Yes   Other Topics Concern   Not on file  Social History Narrative   02/05/19   From: the area   Living: Living with wife Manuela Schwartz   Work: Dealer      Family: 2 children - [name] and [name] - liver nearby, no grandkids      Enjoys: float down the river, race track, camping      Exercise: just keeping busy at work   Diet: mostly meat and potatoes      Safety   Seat belts: Yes    Guns: Yes  and secure   Safe in relationships: Yes    Social Determinants of Health   Financial Resource Strain: Low Risk  (02/05/2019)   Overall Financial Resource Strain (CARDIA)    Difficulty of Paying Living Expenses: Not hard at all  Food Insecurity: Not on file  Transportation Needs: Not on file  Physical Activity: Not on file  Stress: Not on file  Social Connections: Not on file   Past Surgical History:  Procedure Laterality Date   CARPAL TUNNEL RELEASE Bilateral 2009   CATARACT EXTRACTION W/PHACO Left 12/17/2019   Procedure: CATARACT EXTRACTION PHACO [place]  PLACEMENT ([place]) LEFT;  Surgeon: Eulogio Bear, MD;  Location: [place];  Service: Ophthalmology;  Laterality: Left;  3.61 0:26.9   CATARACT EXTRACTION W/PHACO Right 03/17/2020   Procedure: CATARACT EXTRACTION PHACO AND INTRAOCULAR LENS PLACEMENT ([facility]) RIGHT 4.41  00:28.1;  Surgeon: Eulogio Bear, MD;  Location: [place];  Service: Ophthalmology;  Laterality: Right;   COLONOSCOPY W/ POLYPECTOMY     IR FLUORO GUIDED NEEDLE PLC ASPIRATION/INJECTION LOC  04/10/2020   JOINT REPLACEMENT     LACERATION REPAIR Right ~ 2012   leg   LUMBAR LAMINECTOMY/DECOMPRESSION MICRODISCECTOMY N/A 07/24/2019   Procedure: Decompressive Lumbar Laminectomy Lumbar three-four Lumbar four-five for Epidural Abscess;  Surgeon: Kary Kos, MD;  Location: [place];  Service: Neurosurgery;  Laterality: N/A;   TOTAL HIP ARTHROPLASTY Right 01/11/2013   Procedure: TOTAL HIP ARTHROPLASTY;  Surgeon: Garald Balding, MD;  Location: [place];   Service: Orthopedics;  Laterality: Right;   TOTAL HIP ARTHROPLASTY Left 10/12/2016   Procedure: TOTAL HIP ARTHROPLASTY;  Surgeon: Garald Balding, MD;  Location: [place];  Service: Orthopedics;  Laterality: Left;   TOTAL HIP ARTHROPLASTY Left 04/11/2020   Procedure: LEFT HIP IRRIGATION AND DEBRIDEMENT WITH HEAD AND POLY SWAP;  Surgeon: Leandrew Koyanagi, MD;  Location: [place];  Service: Orthopedics;  Laterality: Left;   TOTAL HIP REVISION Left 06/21/2020   Procedure: LEFT TOTAL HIP REVISION;  Surgeon: Leandrew Koyanagi, MD;  Location: [place];  Service: Orthopedics;  Laterality: Left;   Past Surgical History:  Procedure Laterality Date   CARPAL TUNNEL RELEASE Bilateral 2009   CATARACT EXTRACTION W/PHACO Left 12/17/2019   Procedure: CATARACT EXTRACTION PHACO AND INTRAOCULAR LENS PLACEMENT ([place]) LEFT;  Surgeon: Eulogio Bear, MD;  Location: [place];  Service: Ophthalmology;  Laterality: Left;  3.61 0:26.9   CATARACT EXTRACTION W/PHACO Right 03/17/2020   Procedure: CATARACT EXTRACTION PHACO AND INTRAOCULAR LENS PLACEMENT (IOC) RIGHT 4.41  00:28.1;  Surgeon: Eulogio Bear, MD;  Location: [place];  Service: Ophthalmology;  Laterality: Right;   COLONOSCOPY W/ POLYPECTOMY     IR FLUORO GUIDED NEEDLE PLC ASPIRATION/INJECTION LOC  04/10/2020   JOINT REPLACEMENT     LACERATION REPAIR Right ~ 2012   leg   LUMBAR LAMINECTOMY/DECOMPRESSION MICRODISCECTOMY N/A 07/24/2019   Procedure: Decompressive Lumbar Laminectomy Lumbar three-four Lumbar four-five for Epidural Abscess;  Surgeon: Kary Kos, MD;  Location: [place];  Service: Neurosurgery;  Laterality: N/A;   TOTAL HIP ARTHROPLASTY Right 01/11/2013   Procedure: TOTAL HIP ARTHROPLASTY;  Surgeon: Garald Balding, MD;  Location: [place];  Service: Orthopedics;  Laterality: Right;   TOTAL HIP ARTHROPLASTY Left 10/12/2016   Procedure: TOTAL HIP ARTHROPLASTY;  Surgeon: Garald Balding, MD;  Location: [place];  Service: Orthopedics;  Laterality:  Left;   TOTAL HIP ARTHROPLASTY Left 04/11/2020   Procedure: LEFT HIP IRRIGATION AND DEBRIDEMENT WITH HEAD AND POLY SWAP;  Surgeon: Leandrew Koyanagi, MD;  Location: [place];  Service: Orthopedics;  Laterality: Left;   TOTAL HIP REVISION Left 06/21/2020   Procedure: LEFT TOTAL HIP REVISION;  Surgeon: Leandrew Koyanagi, MD;  Location: [place];  Service: Orthopedics;  Laterality: Left;   Past Medical History:  Diagnosis Date   Cauda equina syndrome ([place]) 07/31/2019   Cerebral septic emboli ([place]) 07/30/2019   H/O MSSA epidural abscess, L2-L5 07/25/2019   History of chicken pox    Medical history non-contributory    BP 127/73   Pulse 71   Ht 6' (1.829  m)   Wt 269 lb (122 kg)   SpO2 97%   BMI 36.48 kg/m   Opioid Risk Score:   Fall Risk Score:  `1  Depression screen PHQ 2/9     02/16/2022    1:07 PM 12/17/2021    1:06 PM 10/20/2021    1:13 PM 07/28/2021    1:36 PM 06/30/2021   11:54 AM 05/29/2021   11:40 AM 02/19/2021   10:57 AM  Depression screen PHQ 2/9  Decreased Interest 1 0 0 0 0 0 0  Down, Depressed, Hopeless 1 0 0 0 0 0 0  PHQ - 2 Score 2 0 0 0 0 0 0     Review of Systems  Musculoskeletal:  Positive for back pain.       Buttocks pain  All other systems reviewed and are negative.     Objective:   Physical Exam Vitals and nursing note reviewed.  Constitutional:      Appearance: Normal appearance.  Cardiovascular:     Rate and Rhythm: Normal rate and regular rhythm.     Pulses: Normal pulses.     Heart sounds: Normal heart sounds.  Pulmonary:     Effort: Pulmonary effort is normal.     Breath sounds: Normal breath sounds.  Musculoskeletal:     Cervical back: Normal range of motion and neck supple.     Comments: Normal Muscle Bulk and Muscle Testing Reveals:  Upper Extremities: Full ROM and Muscle Strength 5/5 Lumbar Hypersensitivity Left Greater Trochanter Tenderness Lower Extremities : Right: Full ROM and Muscle Strength 5/5 Left Lower Extremity: Decreased ROM and Muscle  Strength 5/5 Left Lower Extremity Flexion Produces Pain into her Left Hip Arises from chair slowly Narrow Based Gait     Skin:    General: Skin is warm and dry.  Neurological:     Mental Status: He is alert and oriented to person, place, and time.  Psychiatric:        Mood and Affect: Mood normal.        Behavior: Behavior normal.         Assessment & Plan:  S/P Lumbar Laminectomy Lumbar Radiculitis: Continue current medication regimen. Continue HEP as Tolerated. Continue to Monitor. 02/16/2022 Chromic Pain Syndrome: Refilled: Oxycodone 5/325 mg one tablet three times a day as needed for pain #90. Second script sent for following month.  We will continue the opioid monitoring program, this consists of regular clinic visits, examinations, urine drug screen, pill counts as well as use of [place] Controlled Substance Reporting system. A 12 month History has been reviewed on the [place] Controlled Substance Reporting System on 02/16/2022 3. Left greater Trochanter Tenderness: No complaints today. Continue to alternate Ice and Heat Therapy. Continue to monitor.  02/16/2022   F/U in 2 months

## 2022-02-25 ENCOUNTER — Encounter: Payer: Self-pay | Admitting: Registered Nurse

## 2022-04-19 ENCOUNTER — Encounter: Payer: 59 | Attending: Physical Medicine and Rehabilitation | Admitting: Registered Nurse

## 2022-04-19 VITALS — BP 156/92 | HR 75 | Ht 72.0 in | Wt 269.0 lb

## 2022-04-19 DIAGNOSIS — Z79899 Other long term (current) drug therapy: Secondary | ICD-10-CM | POA: Insufficient documentation

## 2022-04-19 DIAGNOSIS — M545 Low back pain, unspecified: Secondary | ICD-10-CM | POA: Insufficient documentation

## 2022-04-19 DIAGNOSIS — Z5181 Encounter for therapeutic drug level monitoring: Secondary | ICD-10-CM | POA: Insufficient documentation

## 2022-04-19 DIAGNOSIS — G894 Chronic pain syndrome: Secondary | ICD-10-CM | POA: Insufficient documentation

## 2022-04-19 DIAGNOSIS — G8929 Other chronic pain: Secondary | ICD-10-CM | POA: Insufficient documentation

## 2022-04-19 MED ORDER — OXYCODONE-ACETAMINOPHEN 5-325 MG PO TABS: 1.0000 | ORAL_TABLET | Freq: Three times a day (TID) | ORAL | 0 refills | Status: DC | PRN

## 2022-04-19 NOTE — Progress Notes (Unsigned)
Subjective:    Patient ID: [name], male    DOB: 12/21/1965, 56 y.o.   MRN: [id number]  HPI: [name] is a 56 y.o. male who returns for follow up appointment for chronic pain and medication refill. states *** pain is located in  ***. rates pain ***. current exercise regime is walking and performing stretching exercises.  [name] Morphine equivalent is *** MME.   Last UDS was Performed on 10/18/2021, it was consistent.     Pain Inventory Average Pain 7 Pain Right Now 7 My pain is sharp, burning, and stabbing  In the last 24 hours, has pain interfered with the following? General activity 4 Relation with others 5 Enjoyment of life 4 What TIME of day is your pain at its worst? daytime and evening Sleep (in general) Poor  Pain is worse with: walking, bending, sitting, standing, and some activites Pain improves with: heat/ice and medication Relief from Meds: 5  Family History  Problem Relation Age of Onset   Cancer Father        Hodgkin's disease   COPD Mother    Heart attack Maternal Grandfather 80   Diabetes Neg Hx    Stroke Neg Hx    Hypertension Neg Hx    Hyperlipidemia Neg Hx    Social History   Socioeconomic History   Marital status: Married    Spouse name: Manuela Schwartz   Number of children: 2   Years of education: High school   Highest education level: Not on file  Occupational History   Occupation: burial Occupational hygienist: Doctor, hospital    Comment: [employer]  Tobacco Use   Smoking status: Former    Packs/day: 1.00    Years: 14.00    Total pack years: 14.00    Types: Cigarettes    Quit date: 07/27/1999    Years since quitting: 22.7   Smokeless tobacco: Never  Vaping Use   Vaping Use: Never used  Substance and Sexual Activity   Alcohol use: Not Currently    Alcohol/week: 24.0 standard drinks of alcohol    Types: 24 Cans of beer per week    Comment: 4-5 beers per day, case of beer per week   Drug use: No   Sexual activity: Yes  Other Topics Concern    Not on file  Social History Narrative   02/05/19   From: the area   Living: Living with wife Manuela Schwartz   Work: Dealer      Family: 2 children - [name] and [name] - liver nearby, no grandkids      Enjoys: float down the river, race track, camping      Exercise: just keeping busy at work   Diet: mostly meat and potatoes      Safety   Seat belts: Yes    Guns: Yes  and secure   Safe in relationships: Yes    Social Determinants of Health   Financial Resource Strain: Low Risk  (02/05/2019)   Overall Financial Resource Strain (CARDIA)    Difficulty of Paying Living Expenses: Not hard at all  Food Insecurity: Not on file  Transportation Needs: Not on file  Physical Activity: Not on file  Stress: Not on file  Social Connections: Not on file   Past Surgical History:  Procedure Laterality Date   CARPAL TUNNEL RELEASE Bilateral 2009   CATARACT EXTRACTION W/PHACO Left 12/17/2019   Procedure: CATARACT EXTRACTION PHACO AND INTRAOCULAR LENS PLACEMENT ([place]) LEFT;  Surgeon: Edison Pace,  Josie Saunders, MD;  Location: [place];  Service: Ophthalmology;  Laterality: Left;  3.61 0:26.9   CATARACT EXTRACTION W/PHACO Right 03/17/2020   Procedure: CATARACT EXTRACTION PHACO AND INTRAOCULAR LENS PLACEMENT ([facility]) RIGHT 4.41  00:28.1;  Surgeon: Eulogio Bear, MD;  Location: [place];  Service: Ophthalmology;  Laterality: Right;   COLONOSCOPY W/ POLYPECTOMY     IR FLUORO GUIDED NEEDLE PLC ASPIRATION/INJECTION LOC  04/10/2020   JOINT REPLACEMENT     LACERATION REPAIR Right ~ 2012   leg   LUMBAR LAMINECTOMY/DECOMPRESSION MICRODISCECTOMY N/A 07/24/2019   Procedure: Decompressive Lumbar Laminectomy Lumbar three-four Lumbar four-five for Epidural Abscess;  Surgeon: Kary Kos, MD;  Location: [place];  Service: Neurosurgery;  Laterality: N/A;   TOTAL HIP ARTHROPLASTY Right 01/11/2013   Procedure: TOTAL HIP ARTHROPLASTY;  Surgeon: Garald Balding, MD;  Location[facility];  Service: Orthopedics;   Laterality: Right;   TOTAL HIP ARTHROPLASTY Left 10/12/2016   Procedure: TOTAL HIP ARTHROPLASTY;  Surgeon: Garald Balding, MD;  Location: [place];  Service: Orthopedics;  Laterality: Left;   TOTAL HIP ARTHROPLASTY Left 04/11/2020   Procedure: LEFT HIP IRRIGATION AND DEBRIDEMENT WITH HEAD AND POLY SWAP;  Surgeon: Leandrew Koyanagi, MD;  Location: [place];  Service: Orthopedics;  Laterality: Left;   TOTAL HIP REVISION Left 06/21/2020   Procedure: LEFT TOTAL HIP REVISION;  Surgeon: Leandrew Koyanagi, MD;  Location: [place];  Service: Orthopedics;  Laterality: Left;   Past Surgical History:  Procedure Laterality Date   CARPAL TUNNEL RELEASE Bilateral 2009   CATARACT EXTRACTION W/PHACO Left 12/17/2019   Procedure: CATARACT EXTRACTION PHACO AND INTRAOCULAR LENS PLACEMENT ([place]) LEFT;  Surgeon: Eulogio Bear, MD;  Location: [place];  Service: Ophthalmology;  Laterality: Left;  3.61 0:26.9   CATARACT EXTRACTION W/PHACO Right 03/17/2020   Procedure: CATARACT EXTRACTION PHACO AND INTRAOCULAR LENS PLACEMENT ([facility]) RIGHT 4.41  00:28.1;  Surgeon: Eulogio Bear, MD;  Location: [place];  Service: Ophthalmology;  Laterality: Right;   COLONOSCOPY W/ POLYPECTOMY     IR FLUORO GUIDED NEEDLE PLC ASPIRATION/INJECTION LOC  04/10/2020   JOINT REPLACEMENT     LACERATION REPAIR Right ~ 2012   leg   LUMBAR LAMINECTOMY/DECOMPRESSION MICRODISCECTOMY N/A 07/24/2019   Procedure: Decompressive Lumbar Laminectomy Lumbar three-four Lumbar four-five for Epidural Abscess;  Surgeon: Kary Kos, MD;  Location: [place];  Service: Neurosurgery;  Laterality: N/A;   TOTAL HIP ARTHROPLASTY Right 01/11/2013   Procedure: TOTAL HIP ARTHROPLASTY;  Surgeon: Garald Balding, MD;  Location: [place];  Service: Orthopedics;  Laterality: Right;   TOTAL HIP ARTHROPLASTY Left 10/12/2016   Procedure: TOTAL HIP ARTHROPLASTY;  Surgeon: Garald Balding, MD;  Location: [place];  Service: Orthopedics;  Laterality: Left;   TOTAL HIP  ARTHROPLASTY Left 04/11/2020   Procedure: LEFT HIP IRRIGATION AND DEBRIDEMENT WITH HEAD AND POLY SWAP;  Surgeon: Leandrew Koyanagi, MD;  Location: [place];  Service: Orthopedics;  Laterality: Left;   TOTAL HIP REVISION Left 06/21/2020   Procedure: LEFT TOTAL HIP REVISION;  Surgeon: Leandrew Koyanagi, MD;  Location: [place];  Service: Orthopedics;  Laterality: Left;   Past Medical History:  Diagnosis Date   Cauda equina syndrome ([place]) 07/31/2019   Cerebral septic emboli ([place]) 07/30/2019   H/O MSSA epidural abscess, L2-L5 07/25/2019   History of chicken pox    Medical history non-contributory    BP (!) 156/92   Pulse 75   Ht 6' (1.829 m)   Wt 269  lb (122 kg)   SpO2 98%   BMI 36.48 kg/m   Opioid Risk Score:   Fall Risk Score:  `1  Depression screen PHQ 2/9     04/19/2022    1:08 PM 02/16/2022    1:07 PM 12/17/2021    1:06 PM 10/20/2021    1:13 PM 07/28/2021    1:36 PM 06/30/2021   11:54 AM 05/29/2021   11:40 AM  Depression screen PHQ 2/9  Decreased Interest 0 1 0 0 0 0 0  Down, Depressed, Hopeless 0 1 0 0 0 0 0  PHQ - 2 Score 0 2 0 0 0 0 0     Review of Systems  Constitutional: Negative.   HENT: Negative.    Eyes: Negative.   Respiratory: Negative.    Cardiovascular: Negative.   Gastrointestinal: Negative.   Endocrine: Negative.   Genitourinary: Negative.   Musculoskeletal:  Positive for back pain.  Skin: Negative.   Allergic/Immunologic: Negative.   Neurological: Negative.   Hematological: Negative.   Psychiatric/Behavioral:  Positive for sleep disturbance.       Objective:   Physical Exam        Assessment & Plan:   S/P Lumbar Laminectomy Lumbar Radiculitis: Continue current medication regimen. Continue HEP as Tolerated. Continue to Monitor. 02/16/2022 Chromic Pain Syndrome: Refilled: Oxycodone 5/325 mg one tablet three times a day as needed for pain #90. Second script sent for following month.  We will continue the opioid monitoring program, this consists of regular clinic  visits, examinations, urine drug screen, pill counts as well as use of [place] Controlled Substance Reporting system. A 12 month History has been reviewed on the [place] Controlled Substance Reporting System on 02/16/2022 3. Left greater Trochanter Tenderness: No complaints today. Continue to alternate Ice and Heat Therapy. Continue to monitor.  02/16/2022   F/U in 2 months

## 2022-04-21 ENCOUNTER — Encounter: Payer: Self-pay | Admitting: Registered Nurse

## 2022-04-23 ENCOUNTER — Telehealth: Payer: Self-pay

## 2022-04-23 NOTE — Telephone Encounter (Signed)
PA for Percocet sent to insurance through [facility]

## 2022-04-23 NOTE — Telephone Encounter (Signed)
IRSWNI:[id number];XFGHWE:[id number];Review Type:Prior Auth;Coverage Start Date:04/23/2022;Coverage End Date:04/23/2023;

## 2022-05-24 ENCOUNTER — Telehealth: Payer: Self-pay

## 2022-06-21 ENCOUNTER — Encounter: Payer: Self-pay | Admitting: Registered Nurse

## 2022-06-21 ENCOUNTER — Encounter: Payer: Commercial Managed Care - HMO | Attending: Registered Nurse | Admitting: Registered Nurse

## 2022-06-21 VITALS — BP 129/83 | HR 68 | Ht 72.0 in

## 2022-06-21 DIAGNOSIS — G894 Chronic pain syndrome: Secondary | ICD-10-CM | POA: Diagnosis not present

## 2022-06-21 DIAGNOSIS — Z5181 Encounter for therapeutic drug level monitoring: Secondary | ICD-10-CM

## 2022-06-21 DIAGNOSIS — Z79899 Other long term (current) drug therapy: Secondary | ICD-10-CM

## 2022-06-21 DIAGNOSIS — M7062 Trochanteric bursitis, left hip: Secondary | ICD-10-CM | POA: Diagnosis present

## 2022-06-21 DIAGNOSIS — M5416 Radiculopathy, lumbar region: Secondary | ICD-10-CM | POA: Diagnosis not present

## 2022-06-21 MED ORDER — OXYCODONE-ACETAMINOPHEN 5-325 MG PO TABS: 1.0000 | ORAL_TABLET | Freq: Three times a day (TID) | ORAL | 0 refills | Status: DC | PRN

## 2022-06-21 NOTE — Progress Notes (Unsigned)
Subjective:    Patient ID: [name], male    DOB: 08/31/1965, 56 y.o.   MRN: [id number]  HPI: [name] is a 56 y.o. male who returns for follow up appointment for chronic pain and medication refill. states *** pain is located in  ***. rates pain ***. current exercise regime is walking and performing stretching exercises.  Ms. Opdahl Morphine equivalent is 21.00 MME.   UDS ordered today.    Pain Inventory Average Pain 8 Pain Right Now 8 My pain is sharp, burning, stabbing, tingling, and aching  In the last 24 hours, has pain interfered with the following? General activity 4 Relation with others 6 Enjoyment of life 3 What TIME of day is your pain at its worst? morning , daytime, evening, and night Sleep (in general) Poor  Pain is worse with: walking, bending, sitting, standing, and some activites Pain improves with: heat/ice, pacing activities, and medication Relief from Meds: 6  Family History  Problem Relation Age of Onset   Cancer Father        Hodgkin's disease   COPD Mother    Heart attack Maternal Grandfather 80   Diabetes Neg Hx    Stroke Neg Hx    Hypertension Neg Hx    Hyperlipidemia Neg Hx    Social History   Socioeconomic History   Marital status: Married    Spouse name: Manuela Schwartz   Number of children: 2   Years of education: High school   Highest education level: Not on file  Occupational History   Occupation: burial Occupational hygienist: Doctor, hospital    Comment: [employer]  Tobacco Use   Smoking status: Former    Packs/day: 1.00    Years: 14.00    Total pack years: 14.00    Types: Cigarettes    Quit date: 07/27/1999    Years since quitting: 22.9   Smokeless tobacco: Never  Vaping Use   Vaping Use: Never used  Substance and Sexual Activity   Alcohol use: Not Currently    Alcohol/week: 24.0 standard drinks of alcohol    Types: 24 Cans of beer per week    Comment: 4-5 beers per day, case of beer per week   Drug use: No   Sexual activity: Yes   Other Topics Concern   Not on file  Social History Narrative   02/05/19   From: the area   Living: Living with wife Manuela Schwartz   Work: Dealer      Family: 2 children - [name] and [name] - liver nearby, no grandkids      Enjoys: float down the river, race track, camping      Exercise: just keeping busy at work   Diet: mostly meat and potatoes      Safety   Seat belts: Yes    Guns: Yes  and secure   Safe in relationships: Yes    Social Determinants of Health   Financial Resource Strain: Low Risk  (02/05/2019)   Overall Financial Resource Strain (CARDIA)    Difficulty of Paying Living Expenses: Not hard at all  Food Insecurity: Not on file  Transportation Needs: Not on file  Physical Activity: Not on file  Stress: Not on file  Social Connections: Not on file   Past Surgical History:  Procedure Laterality Date   CARPAL TUNNEL RELEASE Bilateral 2009   CATARACT EXTRACTION W/PHACO Left 12/17/2019   Procedure: CATARACT EXTRACTION PHACO AND INTRAOCULAR LENS PLACEMENT ([place]) LEFT;  Surgeon: Edison Pace,  Josie Saunders, MD;  Location: [place];  Service: Ophthalmology;  Laterality: Left;  3.61 0:26.9   CATARACT EXTRACTION W/PHACO Right 03/17/2020   Procedure: CATARACT EXTRACTION PHACO AND INTRAOCULAR LENS PLACEMENT ([facility]) RIGHT 4.41  00:28.1;  Surgeon: Eulogio Bear, MD;  Location: [place] [facility];  Service: Ophthalmology;  Laterality: Right;   COLONOSCOPY W/ POLYPECTOMY     IR FLUORO GUIDED NEEDLE PLC ASPIRATION/INJECTION LOC  04/10/2020   JOINT REPLACEMENT     LACERATION REPAIR Right ~ 2012   leg   LUMBAR LAMINECTOMY/DECOMPRESSION MICRODISCECTOMY N/A 07/24/2019   Procedure: Decompressive Lumbar Laminectomy Lumbar three-four Lumbar four-five for Epidural Abscess;  Surgeon: Kary Kos, MD;  Location: [place];  Service: Neurosurgery;  Laterality: N/A;   TOTAL HIP ARTHROPLASTY Right 01/11/2013   Procedure: TOTAL HIP ARTHROPLASTY;  Surgeon: Garald Balding, MD;  Location: [place];   Service: Orthopedics;  Laterality: Right;   TOTAL HIP ARTHROPLASTY Left 10/12/2016   Procedure: TOTAL HIP ARTHROPLASTY;  Surgeon: Garald Balding, MD;  Location: [facility];  Service: Orthopedics;  Laterality: Left;   TOTAL HIP ARTHROPLASTY Left 04/11/2020   Procedure: LEFT HIP IRRIGATION AND DEBRIDEMENT WITH HEAD AND POLY SWAP;  Surgeon: Leandrew Koyanagi, MD;  Location: [place];  Service: Orthopedics;  Laterality: Left;   TOTAL HIP REVISION Left 06/21/2020   Procedure: LEFT TOTAL HIP REVISION;  Surgeon: Leandrew Koyanagi, MD;  Location: [place];  Service: Orthopedics;  Laterality: Left;   Past Surgical History:  Procedure Laterality Date   CARPAL TUNNEL RELEASE Bilateral 2009   CATARACT EXTRACTION W/PHACO Left 12/17/2019   Procedure: CATARACT EXTRACTION PHACO AND INTRAOCULAR LENS PLACEMENT ([place]) LEFT;  Surgeon: Eulogio Bear, MD;  Location: [place];  Service: Ophthalmology;  Laterality: Left;  3.61 0:26.9   CATARACT EXTRACTION W/PHACO Right 03/17/2020   Procedure: CATARACT EXTRACTION PHACO AND INTRAOCULAR LENS PLACEMENT ([facility]) RIGHT 4.41  00:28.1;  Surgeon: Eulogio Bear, MD;  Location: [place];  Service: Ophthalmology;  Laterality: Right;   COLONOSCOPY W/ POLYPECTOMY     IR FLUORO GUIDED NEEDLE PLC ASPIRATION/INJECTION LOC  04/10/2020   JOINT REPLACEMENT     LACERATION REPAIR Right ~ 2012   leg   LUMBAR LAMINECTOMY/DECOMPRESSION MICRODISCECTOMY N/A 07/24/2019   Procedure: Decompressive Lumbar Laminectomy Lumbar three-four Lumbar four-five for Epidural Abscess;  Surgeon: Kary Kos, MD;  Location: [place];  Service: Neurosurgery;  Laterality: N/A;   TOTAL HIP ARTHROPLASTY Right 01/11/2013   Procedure: TOTAL HIP ARTHROPLASTY;  Surgeon: Garald Balding, MD;  Location: [place];  Service: Orthopedics;  Laterality: Right;   TOTAL HIP ARTHROPLASTY Left 10/12/2016   Procedure: TOTAL HIP ARTHROPLASTY;  Surgeon: Garald Balding, MD;  Location: [place];  Service: Orthopedics;  Laterality:  Left;   TOTAL HIP ARTHROPLASTY Left 04/11/2020   Procedure: LEFT HIP IRRIGATION AND DEBRIDEMENT WITH HEAD AND POLY SWAP;  Surgeon: Leandrew Koyanagi, MD;  Location: [place];  Service: Orthopedics;  Laterality: Left;   TOTAL HIP REVISION Left 06/21/2020   Procedure: LEFT TOTAL HIP REVISION;  Surgeon: Leandrew Koyanagi, MD;  Location: [place];  Service: Orthopedics;  Laterality: Left;   Past Medical History:  Diagnosis Date   Cauda equina syndrome ([place]) 07/31/2019   Cerebral septic emboli ([place]) 07/30/2019   H/O MSSA epidural abscess, L2-L5 07/25/2019   History of chicken pox    Medical history non-contributory    There were no vitals taken for this visit.  Opioid Risk Score:   Fall Risk Score:  `  1  Depression screen PHQ 2/9     04/19/2022    1:08 PM 02/16/2022    1:07 PM 12/17/2021    1:06 PM 10/20/2021    1:13 PM 07/28/2021    1:36 PM 06/30/2021   11:54 AM 05/29/2021   11:40 AM  Depression screen PHQ 2/9  Decreased Interest 0 1 0 0 0 0 0  Down, Depressed, Hopeless 0 1 0 0 0 0 0  PHQ - 2 Score 0 2 0 0 0 0 0      Review of Systems  Musculoskeletal:  Positive for back pain.       Left hip pain  All other systems reviewed and are negative.     Objective:   Physical Exam        Assessment & Plan:  S/P Lumbar Laminectomy Lumbar Radiculitis: Continue current medication regimen. Continue HEP as Tolerated. Continue to Monitor. 04/19/2022 Chromic Pain Syndrome: Refilled: Oxycodone 5/325 mg one tablet three times a day as needed for pain #90. Second script sent for following month.  We will continue the opioid monitoring program, this consists of regular clinic visits, examinations, urine drug screen, pill counts as well as use of [place] Controlled Substance Reporting system. A 12 month History has been reviewed on the [place] Controlled Substance Reporting System on 04/19/2022 3. Left greater Trochanter Tenderness: No complaints today. Continue to alternate Ice and Heat Therapy.  Continue to monitor.  02/16/2022   F/U in 2 months

## 2022-06-23 ENCOUNTER — Encounter: Payer: Self-pay | Admitting: Registered Nurse

## 2022-06-24 LAB — TOXASSURE SELECT,+ANTIDEPR,UR

## 2022-07-09 ENCOUNTER — Telehealth: Payer: Self-pay | Admitting: *Deleted

## 2022-07-09 NOTE — Telephone Encounter (Signed)
Urine drug screen for this encounter is consistent for prescribed medication, but is also positive for alcohol. A formal warning letter will be sent. He was given a letter last year 01/15/21. This will be his second and final warning.

## 2022-08-16 ENCOUNTER — Encounter: Payer: Self-pay | Admitting: Registered Nurse

## 2022-08-16 ENCOUNTER — Encounter: Payer: Commercial Managed Care - HMO | Attending: Registered Nurse | Admitting: Registered Nurse

## 2022-08-16 VITALS — BP 134/83 | HR 68 | Ht 72.0 in | Wt 265.0 lb

## 2022-08-16 DIAGNOSIS — M7062 Trochanteric bursitis, left hip: Secondary | ICD-10-CM | POA: Diagnosis not present

## 2022-08-16 DIAGNOSIS — M5416 Radiculopathy, lumbar region: Secondary | ICD-10-CM | POA: Insufficient documentation

## 2022-08-16 DIAGNOSIS — Z5181 Encounter for therapeutic drug level monitoring: Secondary | ICD-10-CM | POA: Diagnosis not present

## 2022-08-16 DIAGNOSIS — Z79899 Other long term (current) drug therapy: Secondary | ICD-10-CM | POA: Diagnosis present

## 2022-08-16 DIAGNOSIS — G894 Chronic pain syndrome: Secondary | ICD-10-CM | POA: Insufficient documentation

## 2022-08-16 MED ORDER — OXYCODONE-ACETAMINOPHEN 5-325 MG PO TABS: 1.0000 | ORAL_TABLET | Freq: Three times a day (TID) | ORAL | 0 refills | Status: DC | PRN

## 2022-08-16 NOTE — Progress Notes (Signed)
Subjective:    Patient ID: [name], male    DOB: [birth year], 57 y.o.   MRN: [id number]  HPI: [name] is a 57 y.o. male who returns for follow up appointment for chronic pain and medication refill. He states his pain is located in his lower back radiating into his left lower extremity. He reports he's receiving 6 hours of relief with his current medication regimen. He rates his pain 8. His current exercise regime is walking and performing stretching exercises.  Mr. Winemiller Morphine equivalent is 22.50 MME.   Last UDS was 06/21/2022, see note for details. We reviewed the narcotic policy, and his last UDS was positive for ETOH. If  he has another inconsistent UDS he will be discharged from our office, he verbalizes understanding.      Pain Inventory Average Pain 7 Pain Right Now 8 My pain is constant, sharp, burning, stabbing, and aching  In the last 24 hours, has pain interfered with the following? General activity 8 Relation with others 9 Enjoyment of life 9 What TIME of day is your pain at its worst? daytime, evening, and night Sleep (in general) Poor  Pain is worse with: walking, bending, sitting, standing, and some activites Pain improves with: rest, heat/ice, pacing activities, and medication Relief from Meds: 6  Family History  Problem Relation Age of Onset   Cancer Father        Hodgkin's disease   COPD Mother    Heart attack Maternal Grandfather 80   Diabetes Neg Hx    Stroke Neg Hx    Hypertension Neg Hx    Hyperlipidemia Neg Hx    Social History   Socioeconomic History   Marital status: Married    Spouse name: Manuela Schwartz   Number of children: 2   Years of education: High school   Highest education level: Not on file  Occupational History   Occupation: burial Occupational hygienist: Doctor, hospital    Comment: [employer]  Tobacco Use   Smoking status: Former    Packs/day: 1.00    Years: 14.00    Total pack years: 14.00    Types: Cigarettes    Quit date:  07/27/1999    Years since quitting: 23.0   Smokeless tobacco: Never  Vaping Use   Vaping Use: Never used  Substance and Sexual Activity   Alcohol use: Not Currently    Alcohol/week: 24.0 standard drinks of alcohol    Types: 24 Cans of beer per week    Comment: 4-5 beers per day, case of beer per week   Drug use: No   Sexual activity: Yes  Other Topics Concern   Not on file  Social History Narrative   02/05/19   From: the area   Living: Living with wife Manuela Schwartz   Work: Dealer      Family: 2 children - [name] and [name] - liver nearby, no grandkids      Enjoys: float down the river, race track, camping      Exercise: just keeping busy at work   Diet: mostly meat and potatoes      Safety   Seat belts: Yes    Guns: Yes  and secure   Safe in relationships: Yes    Social Determinants of Health   Financial Resource Strain: Low Risk  (02/05/2019)   Overall Financial Resource Strain (CARDIA)    Difficulty of Paying Living Expenses: Not hard at all  Food Insecurity: Not on file  Transportation Needs: Not on file  Physical Activity: Not on file  Stress: Not on file  Social Connections: Not on file   Past Surgical History:  Procedure Laterality Date   CARPAL TUNNEL RELEASE Bilateral 2009   CATARACT EXTRACTION W/PHACO Left 12/17/2019   Procedure: CATARACT EXTRACTION PHACO AND INTRAOCULAR LENS PLACEMENT ([place]) LEFT;  Surgeon: Eulogio Bear, MD;  Location: [place];  Service: Ophthalmology;  Laterality: Left;  3.61 0:26.9   CATARACT EXTRACTION W/PHACO Right 03/17/2020   Procedure: CATARACT EXTRACTION PHACO AND INTRAOCULAR LENS PLACEMENT ([facility]) RIGHT 4.41  00:28.1;  Surgeon: Eulogio Bear, MD;  Location: [place];  Service: Ophthalmology;  Laterality: Right;   COLONOSCOPY W/ POLYPECTOMY     IR FLUORO GUIDED NEEDLE PLC ASPIRATION/INJECTION LOC  04/10/2020   JOINT REPLACEMENT     LACERATION REPAIR Right ~ 2012   leg   LUMBAR LAMINECTOMY/DECOMPRESSION  MICRODISCECTOMY N/A 07/24/2019   Procedure: Decompressive Lumbar Laminectomy Lumbar three-four Lumbar four-five for Epidural Abscess;  Surgeon: Kary Kos, MD;  Location: [place];  Service: Neurosurgery;  Laterality: N/A;   TOTAL HIP ARTHROPLASTY Right 01/11/2013   Procedure: TOTAL HIP ARTHROPLASTY;  Surgeon: Garald Balding, MD;  Location: [place];  Service: Orthopedics;  Laterality: Right;   TOTAL HIP ARTHROPLASTY Left 10/12/2016   Procedure: TOTAL HIP ARTHROPLASTY;  Surgeon: Garald Balding, MD;  Location: [place];  Service: Orthopedics;  Laterality: Left;   TOTAL HIP ARTHROPLASTY Left 04/11/2020   Procedure: LEFT HIP IRRIGATION AND DEBRIDEMENT WITH HEAD AND POLY SWAP;  Surgeon: Leandrew Koyanagi, MD;  Location: [place];  Service: Orthopedics;  Laterality: Left;   TOTAL HIP REVISION Left 06/21/2020   Procedure: LEFT TOTAL HIP REVISION;  Surgeon: Leandrew Koyanagi, MD;  Location: [place];  Service: Orthopedics;  Laterality: Left;   Past Surgical History:  Procedure Laterality Date   CARPAL TUNNEL RELEASE Bilateral 2009   CATARACT EXTRACTION W/PHACO Left 12/17/2019   Procedure: CATARACT EXTRACTION PHACO AND INTRAOCULAR LENS PLACEMENT ([place]) LEFT;  Surgeon: Eulogio Bear, MD;  Location: [place];  Service: Ophthalmology;  Laterality: Left;  3.61 0:26.9   CATARACT EXTRACTION W/PHACO Right 03/17/2020   Procedure: CATARACT EXTRACTION PHACO AND INTRAOCULAR LENS PLACEMENT ([facility]) RIGHT 4.41  00:28.1;  Surgeon: Eulogio Bear, MD;  Location: [place];  Service: Ophthalmology;  Laterality: Right;   COLONOSCOPY W/ POLYPECTOMY     IR FLUORO GUIDED NEEDLE PLC ASPIRATION/INJECTION LOC  04/10/2020   JOINT REPLACEMENT     LACERATION REPAIR Right ~ 2012   leg   LUMBAR LAMINECTOMY/DECOMPRESSION MICRODISCECTOMY N/A 07/24/2019   Procedure: Decompressive Lumbar Laminectomy Lumbar three-four Lumbar four-five for Epidural Abscess;  Surgeon: Kary Kos, MD;  Location: [place];  Service: Neurosurgery;   Laterality: N/A;   TOTAL HIP ARTHROPLASTY Right 01/11/2013   Procedure: TOTAL HIP ARTHROPLASTY;  Surgeon: Garald Balding, MD;  Location: [place];  Service: Orthopedics;  Laterality: Right;   TOTAL HIP ARTHROPLASTY Left 10/12/2016   Procedure: TOTAL HIP ARTHROPLASTY;  Surgeon: Garald Balding, MD;  Location: [place];  Service: Orthopedics;  Laterality: Left;   TOTAL HIP ARTHROPLASTY Left 04/11/2020   Procedure: LEFT HIP IRRIGATION AND DEBRIDEMENT WITH HEAD AND POLY SWAP;  Surgeon: Leandrew Koyanagi, MD;  Location: [place];  Service: Orthopedics;  Laterality: Left;   TOTAL HIP REVISION Left 06/21/2020   Procedure: LEFT TOTAL HIP REVISION;  Surgeon: Leandrew Koyanagi, MD;  Location: [place];  Service: Orthopedics;  Laterality: Left;  Past Medical History:  Diagnosis Date   Cauda equina syndrome ([place]) 07/31/2019   Cerebral septic emboli ([place]) 07/30/2019   H/O MSSA epidural abscess, L2-L5 07/25/2019   History of chicken pox    Medical history non-contributory    There were no vitals taken for this visit.  Opioid Risk Score:   Fall Risk Score:  `1  Depression screen PHQ 2/9     06/21/2022    1:10 PM 04/19/2022    1:08 PM 02/16/2022    1:07 PM 12/17/2021    1:06 PM 10/20/2021    1:13 PM 07/28/2021    1:36 PM 06/30/2021   11:54 AM  Depression screen PHQ 2/9  Decreased Interest 0 0 1 0 0 0 0  Down, Depressed, Hopeless 0 0 1 0 0 0 0  PHQ - 2 Score 0 0 2 0 0 0 0    Review of Systems  Musculoskeletal:  Positive for back pain.       Left hip pain  All other systems reviewed and are negative.      Objective:   Physical Exam Vitals and nursing note reviewed.  Constitutional:      Appearance: Normal appearance.  Cardiovascular:     Rate and Rhythm: Normal rate and regular rhythm.     Pulses: Normal pulses.     Heart sounds: Normal heart sounds.  Musculoskeletal:     Cervical back: Normal range of motion and neck supple.     Comments: Normal Muscle Bulk and Muscle Testing Reveals:  Upper  Extremities: Full ROM and Muscle Strength 5/5  Lumbar Paraspinal Tenderness: L-3-L-5 Mainly Left Side  Left Greater Trochanter Tenderness Lower Extremities : Right: Full ROM and Muscle Strength 5/5 Left Lower Extremity: Decreased ROM and Muscle Strength 5/5 Left Lower extremity Flexion Produces Pain into his Left Patella Arises from Table slowly Antalgic Gait     Skin:    General: Skin is warm and dry.  Neurological:     Mental Status: He is alert and oriented to person, place, and time.  Psychiatric:        Mood and Affect: Mood normal.        Behavior: Behavior normal.           Assessment & Plan:  S/P Lumbar Laminectomy Lumbar Radiculitis: Continue current medication regimen. Continue HEP as Tolerated. Continue to Monitor. 08/16/2022 Chromic Pain Syndrome: Increased: Refilled: Oxycodone 5/325 mg one tablet 4 times a day as needed for pain #100. Second script sent for following month.  We will continue the opioid monitoring program, this consists of regular clinic visits, examinations, urine drug screen, pill counts as well as use of [place] Controlled Substance Reporting system. A 12 month History has been reviewed on the [place] Controlled Substance Reporting System on 08/16/2022 3. Left greater Trochanter Tenderness:  Continue to alternate Ice and Heat Therapy. Continue to monitor.  08/16/2022   F/U in 2 months

## 2022-08-17 ENCOUNTER — Encounter: Payer: Self-pay | Admitting: Registered Nurse

## 2022-08-17 MED ORDER — OXYCODONE-ACETAMINOPHEN 5-325 MG PO TABS: 1.0000 | ORAL_TABLET | Freq: Four times a day (QID) | ORAL | 0 refills | Status: DC | PRN

## 2022-10-15 ENCOUNTER — Encounter: Payer: Commercial Managed Care - HMO | Attending: Registered Nurse | Admitting: Registered Nurse

## 2022-10-15 VITALS — BP 127/62 | HR 76 | Ht 72.0 in | Wt 263.0 lb

## 2022-10-15 DIAGNOSIS — M5416 Radiculopathy, lumbar region: Secondary | ICD-10-CM | POA: Diagnosis not present

## 2022-10-15 DIAGNOSIS — Z5181 Encounter for therapeutic drug level monitoring: Secondary | ICD-10-CM

## 2022-10-15 DIAGNOSIS — Z79899 Other long term (current) drug therapy: Secondary | ICD-10-CM

## 2022-10-15 DIAGNOSIS — G894 Chronic pain syndrome: Secondary | ICD-10-CM

## 2022-10-15 DIAGNOSIS — M7062 Trochanteric bursitis, left hip: Secondary | ICD-10-CM

## 2022-10-15 MED ORDER — OXYCODONE-ACETAMINOPHEN 5-325 MG PO TABS: 1.0000 | ORAL_TABLET | Freq: Four times a day (QID) | ORAL | 0 refills | Status: AC | PRN

## 2022-10-15 MED ORDER — OXYCODONE-ACETAMINOPHEN 5-325 MG PO TABS: 1.0000 | ORAL_TABLET | Freq: Four times a day (QID) | ORAL | 0 refills | Status: DC | PRN

## 2022-10-15 NOTE — Progress Notes (Unsigned)
Subjective:    Patient ID: [name], male    DOB: [birth year], 57 y.o.   MRN: [id number]  HPI: [name] is a 57 y.o. male who returns for follow up appointment for chronic pain and medication refill. states *** pain is located in  ***. rates pain ***. current exercise regime is walking and performing stretching exercises.  [name] Morphine equivalent is *** MME.   UDS ordered today    Pain Inventory Average Pain 6 Pain Right Now 8 My pain is sharp, burning, stabbing, and aching  In the last 24 hours, has pain interfered with the following? General activity 4 Relation with others 3 Enjoyment of life 2 What TIME of day is your pain at its worst? morning , daytime, evening, and night Sleep (in general) Poor  Pain is worse with: walking, bending, sitting, standing, and some activites Pain improves with: rest, heat/ice, and medication Relief from Meds: 6  Family History  Problem Relation Age of Onset   Cancer Father        Hodgkin's disease   COPD Mother    Heart attack Maternal Grandfather 80   Diabetes Neg Hx    Stroke Neg Hx    Hypertension Neg Hx    Hyperlipidemia Neg Hx    Social History   Socioeconomic History   Marital status: Married    Spouse name: Manuela Schwartz   Number of children: 2   Years of education: High school   Highest education level: Not on file  Occupational History   Occupation: burial Occupational hygienist: Doctor, hospital    Comment: [employer]  Tobacco Use   Smoking status: Former    Packs/day: 1.00    Years: 14.00    Additional pack years: 0.00    Total pack years: 14.00    Types: Cigarettes    Quit date: 07/27/1999    Years since quitting: 23.2   Smokeless tobacco: Never  Vaping Use   Vaping Use: Never used  Substance and Sexual Activity   Alcohol use: Not Currently    Alcohol/week: 24.0 standard drinks of alcohol    Types: 24 Cans of beer per week    Comment: 4-5 beers per day, case of beer per week   Drug use: No   Sexual activity:  Yes  Other Topics Concern   Not on file  Social History Narrative   02/05/19   From: the area   Living: Living with wife Manuela Schwartz   Work: Dealer      Family: 2 children - [name] and [name] - liver nearby, no grandkids      Enjoys: float down the river, race track, camping      Exercise: just keeping busy at work   Diet: mostly meat and potatoes      Safety   Seat belts: Yes    Guns: Yes  and secure   Safe in relationships: Yes    Social Determinants of Health   Financial Resource Strain: Low Risk  (02/05/2019)   Overall Financial Resource Strain (CARDIA)    Difficulty of Paying Living Expenses: Not hard at all  Food Insecurity: Not on file  Transportation Needs: Not on file  Physical Activity: Not on file  Stress: Not on file  Social Connections: Not on file   Past Surgical History:  Procedure Laterality Date   CARPAL TUNNEL RELEASE Bilateral 2009   CATARACT EXTRACTION W/PHACO Left 12/17/2019   Procedure: CATARACT EXTRACTION PHACO [place] (  [facility]) LEFT;  Surgeon: Eulogio Bear, MD;  Location: [place];  Service: Ophthalmology;  Laterality: Left;  3.61 0:26.9   CATARACT EXTRACTION W/PHACO Right 03/17/2020   Procedure: CATARACT EXTRACTION PHACO AND INTRAOCULAR LENS PLACEMENT ([facility]) RIGHT 4.41  00:28.1;  Surgeon: Eulogio Bear, MD;  Location: [place];  Service: Ophthalmology;  Laterality: Right;   COLONOSCOPY W/ POLYPECTOMY     IR FLUORO GUIDED NEEDLE PLC ASPIRATION/INJECTION LOC  04/10/2020   JOINT REPLACEMENT     LACERATION REPAIR Right ~ 2012   leg   LUMBAR LAMINECTOMY/DECOMPRESSION MICRODISCECTOMY N/A 07/24/2019   Procedure: Decompressive Lumbar Laminectomy Lumbar three-four Lumbar four-five for Epidural Abscess;  Surgeon: Kary Kos, MD;  Location: [place];  Service: Neurosurgery;  Laterality: N/A;   TOTAL HIP ARTHROPLASTY Right 01/11/2013   Procedure: TOTAL HIP ARTHROPLASTY;  Surgeon: Garald Balding, MD;  Location: [place];  Service: Orthopedics;  Laterality: Right;   TOTAL HIP ARTHROPLASTY Left 10/12/2016   Procedure: TOTAL HIP ARTHROPLASTY;  Surgeon: Garald Balding, MD;  Location: [place];  Service: Orthopedics;  Laterality: Left;   TOTAL HIP ARTHROPLASTY Left 04/11/2020   Procedure: LEFT HIP IRRIGATION AND DEBRIDEMENT WITH HEAD AND POLY SWAP;  Surgeon: Leandrew Koyanagi, MD;  Location: [place];  Service: Orthopedics;  Laterality: Left;   TOTAL HIP REVISION Left 06/21/2020   Procedure: LEFT TOTAL HIP REVISION;  Surgeon: Leandrew Koyanagi, MD;  Location: [place];  Service: Orthopedics;  Laterality: Left;   Past Surgical History:  Procedure Laterality Date   CARPAL TUNNEL RELEASE Bilateral 2009   CATARACT EXTRACTION W/PHACO Left 12/17/2019   Procedure: CATARACT EXTRACTION PHACO AND INTRAOCULAR LENS PLACEMENT ([place]) LEFT;  Surgeon: Eulogio Bear, MD;  Location: [place];  Service: Ophthalmology;  Laterality: Left;  3.61 0:26.9   CATARACT EXTRACTION W/PHACO Right 03/17/2020   Procedure: CATARACT EXTRACTION PHACO AND INTRAOCULAR LENS PLACEMENT ([facility]) RIGHT 4.41  00:28.1;  Surgeon: Eulogio Bear, MD;  Location: [place];  Service: Ophthalmology;  Laterality: Right;   COLONOSCOPY W/ POLYPECTOMY     IR FLUORO GUIDED NEEDLE PLC ASPIRATION/INJECTION LOC  04/10/2020   JOINT REPLACEMENT     LACERATION REPAIR Right ~ 2012   leg   LUMBAR LAMINECTOMY/DECOMPRESSION MICRODISCECTOMY N/A 07/24/2019   Procedure: Decompressive Lumbar Laminectomy Lumbar three-four Lumbar four-five for Epidural Abscess;  Surgeon: Kary Kos, MD;  Location: [place];  Service: Neurosurgery;  Laterality: N/A;   TOTAL HIP ARTHROPLASTY Right 01/11/2013   Procedure: TOTAL HIP ARTHROPLASTY;  Surgeon: Garald Balding, MD;  Location: [place];  Service: Orthopedics;  Laterality: Right;   TOTAL HIP ARTHROPLASTY Left 10/12/2016   Procedure: TOTAL HIP ARTHROPLASTY;  Surgeon: Garald Balding, MD;  Location: [place];  Service: Orthopedics;   Laterality: Left;   TOTAL HIP ARTHROPLASTY Left 04/11/2020   Procedure: LEFT HIP IRRIGATION AND DEBRIDEMENT WITH HEAD AND POLY SWAP;  Surgeon: Leandrew Koyanagi, MD;  Location: [place];  Service: Orthopedics;  Laterality: Left;   TOTAL HIP REVISION Left 06/21/2020   Procedure: LEFT TOTAL HIP REVISION;  Surgeon: Leandrew Koyanagi, MD;  Location: [place];  Service: Orthopedics;  Laterality: Left;   Past Medical History:  Diagnosis Date   Cauda equina syndrome ([place]) 07/31/2019   Cerebral septic emboli ([place]) 07/30/2019   H/O MSSA epidural abscess, L2-L5 07/25/2019   History of chicken pox    Medical history non-contributory    BP 127/62   Pulse 76   Ht 6' (1.829 m)  Wt 263 lb (119.3 kg)   SpO2 99%   BMI 35.67 kg/m   Opioid Risk Score:   Fall Risk Score:  `1  Depression screen PHQ 2/9     08/16/2022    1:14 PM 06/21/2022    1:10 PM 04/19/2022    1:08 PM 02/16/2022    1:07 PM 12/17/2021    1:06 PM 10/20/2021    1:13 PM 07/28/2021    1:36 PM  Depression screen PHQ 2/9  Decreased Interest 0 0 0 1 0 0 0  Down, Depressed, Hopeless 0 0 0 1 0 0 0  PHQ - 2 Score 0 0 0 2 0 0 0      Review of Systems  Musculoskeletal:  Positive for back pain.       LT hip pain  All other systems reviewed and are negative.      Objective:   Physical Exam        Assessment & Plan:   S/P Lumbar Laminectomy Lumbar Radiculitis: Continue current medication regimen. Continue HEP as Tolerated. Continue to Monitor. 08/16/2022 Chromic Pain Syndrome: Increased: Refilled: Oxycodone 5/325 mg one tablet 4 times a day as needed for pain #100. Second script sent for following month.  We will continue the opioid monitoring program, this consists of regular clinic visits, examinations, urine drug screen, pill counts as well as use of [place] Controlled Substance Reporting system. A 12 month History has been reviewed on the [place] Controlled Substance Reporting System on 08/16/2022 3. Left greater Trochanter  Tenderness:  Continue to alternate Ice and Heat Therapy. Continue to monitor.  08/16/2022   F/U in 2 months

## 2022-10-19 ENCOUNTER — Encounter: Payer: Self-pay | Admitting: Registered Nurse

## 2022-10-21 LAB — TOXASSURE SELECT,+ANTIDEPR,UR

## 2022-10-27 ENCOUNTER — Telehealth: Payer: Self-pay | Admitting: *Deleted

## 2022-10-27 NOTE — Telephone Encounter (Signed)
Mr Kolb urine drug screen has metabolites of unprescribed alprazolam. He was previously given a FINAL warning. He will be discharge per office policy. Letter sent by [facility] certified mail.

## 2022-12-15 ENCOUNTER — Ambulatory Visit: Payer: Commercial Managed Care - HMO | Admitting: Registered Nurse

## 2023-04-12 ENCOUNTER — Encounter: Payer: Self-pay | Admitting: Registered Nurse

## 2023-06-02 ENCOUNTER — Encounter: Payer: Self-pay | Admitting: Gastroenterology

## 2024-03-06 ENCOUNTER — Ambulatory Visit: Payer: Self-pay | Admitting: *Deleted

## 2024-03-06 ENCOUNTER — Other Ambulatory Visit: Payer: Self-pay

## 2024-03-06 ENCOUNTER — Emergency Department (HOSPITAL_COMMUNITY)
Admission: EM | Admit: 2024-03-06 | Discharge: 2024-03-06 | Disposition: A | Discharge status: Home / Self Care | Payer: Self-pay | Attending: Emergency Medicine | Admitting: Emergency Medicine

## 2024-03-06 DIAGNOSIS — T63441A Toxic effect of venom of bees, accidental (unintentional), initial encounter: Secondary | ICD-10-CM | POA: Insufficient documentation

## 2024-03-06 DIAGNOSIS — T7840XA Allergy, unspecified, initial encounter: Secondary | ICD-10-CM

## 2024-03-06 MED ORDER — METHYLPREDNISOLONE SODIUM SUCC 125 MG IJ SOLR
125.0000 mg | INTRAMUSCULAR | Status: AC
  Administered 2024-03-06 (×2): 125 mg via INTRAVENOUS
  Filled 2024-03-06: qty 2

## 2024-03-06 MED ORDER — FAMOTIDINE IN NACL 20-0.9 MG/50ML-% IV SOLN
20.0000 mg | Freq: Once | INTRAVENOUS | Status: AC
  Administered 2024-03-06 (×2): 20 mg via INTRAVENOUS
  Filled 2024-03-06: qty 50

## 2024-03-06 MED ORDER — EPINEPHRINE 0.3 MG/0.3ML IJ SOAJ: 0.3000 mg | INTRAMUSCULAR | 0 refills | Status: AC | PRN

## 2024-03-06 MED ORDER — DIPHENHYDRAMINE HCL 50 MG/ML IJ SOLN
50.0000 mg | Freq: Once | INTRAMUSCULAR | Status: AC
  Administered 2024-03-06 (×2): 50 mg via INTRAVENOUS
  Filled 2024-03-06: qty 1

## 2024-03-06 NOTE — ED Triage Notes (Addendum)
 Pt came in via POV d/t a bee sting that happened when he was looking at the battery on his golf cart this morning. Not sure what type of bee but is allergic to bees. It happened 1 hr prior to arrival & once in the ED his Rt side of face, around his eye is very swollen. No difficulty breathing at this time, airway open & is able to control his own fluids. A/Ox4, denies current pain.

## 2024-03-06 NOTE — Discharge Instructions (Addendum)
 Use Benadryl  as directed if you have any further swelling.  Use the EpiPen  if things get worse

## 2024-03-06 NOTE — ED Provider Notes (Signed)
 [place] EMERGENCY DEPARTMENT AT [facility] Provider Note   CSN: [id number] Arrival date & time: 03/06/24  1010     Patient presents with: Allergic Reaction and Insect Bite   [name] is a 58 y.o. male.   58 year old male presents after being stung by an insect just prior to arrival.  Patient states he was stung the left side of his face which began to swell.  Has known allergies to bees.  He normally uses an EpiPen  but ran out.  Denies any trouble swallowing.  Is not had any rashes.  No pruritus noted.  Denies any wheezing.  No treatment used prior to arrival       Prior to Admission medications   Medication Sig Start Date End Date Taking? Authorizing Provider  ibuprofen  (ADVIL ) 800 MG tablet Take 800 mg by mouth. 04/06/22   [provider]  oxyCODONE -acetaminophen  (PERCOCET) 5-325 MG tablet Take 1 tablet by mouth every 6 (six) hours as needed for moderate pain. Do Not Fill Before 11/12/2022 10/15/22   Debby Fidela CROME, NP    Allergies: Bee venom and Hydrocodone    Review of Systems  All other systems reviewed and are negative.   Updated Vital Signs BP 129/74 (BP Location: Right Arm)   Pulse 92   Temp (!) 97.5 F (36.4 C) (Oral)   Resp (!) 22   Ht 1.829 m (6')   Wt 120.2 kg   SpO2 100%   BMI 35.94 kg/m   Physical Exam Vitals and nursing note reviewed.  Constitutional:      General: He is not in acute distress.    Appearance: Normal appearance. He is well-developed. He is not toxic-appearing.  HENT:     Head: Normocephalic and atraumatic.      Mouth/Throat:     Comments: Oropharynx is clear. Eyes:     General: Lids are normal.     Conjunctiva/sclera: Conjunctivae normal.     Pupils: Pupils are equal, round, and reactive to light.  Neck:     Thyroid: No thyroid mass.     Trachea: No tracheal deviation.  Cardiovascular:     Rate and Rhythm: Normal rate and regular rhythm.     Heart sounds: Normal heart sounds. No murmur heard.     No gallop.  Pulmonary:     Effort: No respiratory distress.     Breath sounds: No stridor. No decreased breath sounds, wheezing, rhonchi or rales.  Abdominal:     General: There is no distension.     Palpations: Abdomen is soft.     Tenderness: There is no abdominal tenderness. There is no rebound.  Musculoskeletal:        General: No tenderness. Normal range of motion.     Cervical back: Normal range of motion and neck supple.  Skin:    General: Skin is warm and dry.     Findings: No abrasion or rash.  Neurological:     Mental Status: He is alert and oriented to person, place, and time. Mental status is at baseline.     GCS: GCS eye subscore is 4. GCS verbal subscore is 5. GCS motor subscore is 6.     Cranial Nerves: No cranial nerve deficit.     Sensory: No sensory deficit.     Motor: Motor function is intact.  Psychiatric:        Attention and Perception: Attention normal.        Speech: Speech normal.  Behavior: Behavior normal.     (all labs ordered are listed, but only abnormal results are displayed) Labs Reviewed - No data to display  EKG: None  Radiology: No results found.   Procedures   Medications Ordered in the ED  methylPREDNISolone  sodium succinate (SOLU-MEDROL ) 125 mg/2 mL injection 125 mg (has no administration in time range)  diphenhydrAMINE  (BENADRYL ) injection 50 mg (has no administration in time range)  famotidine  (PEPCID ) IVPB 20 mg premix (has no administration in time range)                                    Medical Decision Making Risk Prescription drug management.   Patient given Benadryl , Pepcid , Solu-Medrol .  Was monitored here and his facial swelling is greatly improved.  Patient monitored for approximately 2 hours and like to go home at this time.  I informed him that ideally he would be monitored between 4 and 6 hours.  States he would like to go home.  I will give patient prescription for an EpiPen   CRITICAL CARE Performed  by: Curtistine ONEIDA Dawn Total critical care time: 50 minutes Critical care time was exclusive of separately billable procedures and treating other patients. Critical care was necessary to treat or prevent imminent or life-threatening deterioration. Critical care was time spent personally by me on the following activities: development of treatment plan with patient and/or surrogate as well as nursing, discussions with consultants, evaluation of patient's response to treatment, examination of patient, obtaining history from patient or surrogate, ordering and performing treatments and interventions, ordering and review of laboratory studies, ordering and review of radiographic studies, pulse oximetry and re-evaluation of patient's condition.      Final diagnoses:  None    ED Discharge Orders     None          Dawn Curtistine, MD 03/06/24 1208

## 2024-03-06 NOTE — Telephone Encounter (Signed)
 FYI Only or Action Required?: FYI only for provider.  Patient was last seen in primary care on last seen at [facility] 3 years or more ago , no PCP now .  Called Nurse Triage reporting Insect Bite.  Symptoms began 1 hour ago .  Interventions attempted: Nothing.  Symptoms are: gradually worsening.  Triage Disposition: Go to ED Now (or PCP Triage)  Patient/caregiver understands and will follow disposition?: Yes               Copied from CRM #[id number]. Topic: Clinical - Red Word Triage >> Mar 06, 2024  9:39 AM Drema MATSU wrote: Red Word that prompted transfer to Nurse Triage: Patient was stung in the face  by a bee this morning. 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 If worsening sx call 911     1. TYPE: What type of sting was it? (e.g., bee, yellow jacket, unknown)      Bee sting  2. ONSET: When did it occur?      1 hour ago 3. LOCATION: Where is the sting located?  How many stings?     Left eye  4. SWELLING SIZE: How big is the swelling? (e.g., inches or cm)     Swollen shut 5. REDNESS: Is the area red or pink? If Yes, ask: What size is area of redness? (e.g., inches or cm). When did the redness start?     Na  6. PAIN: Is there any pain? If Yes, ask: How bad is it?  (Scale 0-10; or none, mild, moderate, severe)     Na  7. ITCHING: Is there any itching? If Yes, ask: How bad is it?      Na  8. RESPIRATORY DISTRESS: Describe your breathing.     Denies difficulty breathing  9. PRIOR REACTIONS: Have you had any severe allergic reactions to stings in the past? If Yes, ask: What happened?     Yes  10. OTHER SYMPTOMS: Do you have any other symptoms? (e.g., abdomen pain, face or tongue swelling, new rash elsewhere, vomiting)       Left eye swollen shut denies any other  sx hx allergy to bees and does not have epi pen  11. PREGNANCY: Is there any chance you are pregnant? When was your last menstrual period?       na  Protocols used: Bee or Yellow Jacket Sting-A-AH
